# Patient Record
Sex: Female | Born: 1940 | Race: White | Hispanic: No | Marital: Married | Smoking: Never smoker
Health system: Southern US, Community
[De-identification: ages and names within clinical notes are randomized; demographics above are authoritative.]

## PROBLEM LIST (undated history)

## (undated) DIAGNOSIS — Z87442 Personal history of urinary calculi: Secondary | ICD-10-CM

## (undated) DIAGNOSIS — R809 Proteinuria, unspecified: Secondary | ICD-10-CM

## (undated) DIAGNOSIS — K2100 Gastro-esophageal reflux disease with esophagitis, without bleeding: Secondary | ICD-10-CM

## (undated) DIAGNOSIS — E785 Hyperlipidemia, unspecified: Secondary | ICD-10-CM

## (undated) DIAGNOSIS — J45909 Unspecified asthma, uncomplicated: Secondary | ICD-10-CM

## (undated) DIAGNOSIS — E119 Type 2 diabetes mellitus without complications: Secondary | ICD-10-CM

## (undated) DIAGNOSIS — G4733 Obstructive sleep apnea (adult) (pediatric): Secondary | ICD-10-CM

## (undated) DIAGNOSIS — I509 Heart failure, unspecified: Secondary | ICD-10-CM

## (undated) DIAGNOSIS — T7840XA Allergy, unspecified, initial encounter: Secondary | ICD-10-CM

## (undated) DIAGNOSIS — D649 Anemia, unspecified: Secondary | ICD-10-CM

## (undated) DIAGNOSIS — I10 Essential (primary) hypertension: Secondary | ICD-10-CM

## (undated) DIAGNOSIS — A6920 Lyme disease, unspecified: Secondary | ICD-10-CM

## (undated) DIAGNOSIS — L570 Actinic keratosis: Secondary | ICD-10-CM

## (undated) DIAGNOSIS — H25019 Cortical age-related cataract, unspecified eye: Secondary | ICD-10-CM

## (undated) DIAGNOSIS — K7581 Nonalcoholic steatohepatitis (NASH): Secondary | ICD-10-CM

## (undated) DIAGNOSIS — M810 Age-related osteoporosis without current pathological fracture: Secondary | ICD-10-CM

## (undated) DIAGNOSIS — J449 Chronic obstructive pulmonary disease, unspecified: Secondary | ICD-10-CM

## (undated) DIAGNOSIS — C801 Malignant (primary) neoplasm, unspecified: Secondary | ICD-10-CM

## (undated) DIAGNOSIS — R0902 Hypoxemia: Secondary | ICD-10-CM

## (undated) DIAGNOSIS — E039 Hypothyroidism, unspecified: Secondary | ICD-10-CM

## (undated) DIAGNOSIS — G4730 Sleep apnea, unspecified: Secondary | ICD-10-CM

## (undated) DIAGNOSIS — M1990 Unspecified osteoarthritis, unspecified site: Secondary | ICD-10-CM

## (undated) DIAGNOSIS — K649 Unspecified hemorrhoids: Secondary | ICD-10-CM

## (undated) DIAGNOSIS — K219 Gastro-esophageal reflux disease without esophagitis: Secondary | ICD-10-CM

## (undated) DIAGNOSIS — N289 Disorder of kidney and ureter, unspecified: Secondary | ICD-10-CM

## (undated) HISTORY — DX: Obstructive sleep apnea (adult) (pediatric): G47.33

## (undated) HISTORY — PX: PARTIAL HYSTERECTOMY: SHX80

## (undated) HISTORY — DX: Nonalcoholic steatohepatitis (NASH): K75.81

## (undated) HISTORY — DX: Hyperlipidemia, unspecified: E78.5

## (undated) HISTORY — DX: Lyme disease, unspecified: A69.20

## (undated) HISTORY — DX: Chronic obstructive pulmonary disease, unspecified: J44.9

## (undated) HISTORY — DX: Unspecified asthma, uncomplicated: J45.909

## (undated) HISTORY — DX: Proteinuria, unspecified: R80.9

## (undated) HISTORY — PX: HEMORRHOID SURGERY: SHX153

## (undated) HISTORY — DX: Sleep apnea, unspecified: G47.30

## (undated) HISTORY — DX: Essential (primary) hypertension: I10

## (undated) HISTORY — PX: ESOPHAGOGASTRODUODENOSCOPY: SHX1529

## (undated) HISTORY — DX: Type 2 diabetes mellitus without complications: E11.9

## (undated) HISTORY — PX: CHOLECYSTECTOMY: SHX55

## (undated) HISTORY — DX: Actinic keratosis: L57.0

## (undated) HISTORY — PX: EYE SURGERY: SHX253

## (undated) HISTORY — PX: TONSILLECTOMY: SUR1361

## (undated) HISTORY — DX: Disorder of kidney and ureter, unspecified: N28.9

## (undated) HISTORY — PX: ABDOMINAL HYSTERECTOMY: SHX81

## (undated) HISTORY — PX: COLONOSCOPY: SHX174

## (undated) HISTORY — DX: Hypoxemia: R09.02

## (undated) HISTORY — DX: Allergy, unspecified, initial encounter: T78.40XA

## (undated) HISTORY — PX: APPENDECTOMY: SHX54

## (undated) HISTORY — PX: CATARACT EXTRACTION: SUR2

---

## 1978-10-02 HISTORY — PX: CARDIAC CATHETERIZATION: SHX172

## 2004-11-08 ENCOUNTER — Ambulatory Visit: Payer: Self-pay | Admitting: Internal Medicine

## 2005-11-10 ENCOUNTER — Ambulatory Visit: Payer: Self-pay | Admitting: Internal Medicine

## 2006-01-08 ENCOUNTER — Ambulatory Visit: Payer: Self-pay | Admitting: Urology

## 2006-05-02 ENCOUNTER — Other Ambulatory Visit: Payer: Self-pay

## 2006-05-02 ENCOUNTER — Ambulatory Visit: Payer: Self-pay | Admitting: Urology

## 2006-05-24 ENCOUNTER — Ambulatory Visit: Payer: Self-pay | Admitting: Urology

## 2006-12-12 ENCOUNTER — Ambulatory Visit: Payer: Self-pay | Admitting: Internal Medicine

## 2007-04-10 ENCOUNTER — Ambulatory Visit: Payer: Self-pay | Admitting: Internal Medicine

## 2007-04-11 ENCOUNTER — Ambulatory Visit: Payer: Self-pay | Admitting: Internal Medicine

## 2007-04-12 ENCOUNTER — Ambulatory Visit: Payer: Self-pay | Admitting: Internal Medicine

## 2007-04-22 ENCOUNTER — Ambulatory Visit: Payer: Self-pay | Admitting: Urology

## 2007-04-26 ENCOUNTER — Emergency Department: Payer: Self-pay | Admitting: Emergency Medicine

## 2007-05-07 ENCOUNTER — Ambulatory Visit: Payer: Self-pay | Admitting: General Surgery

## 2007-05-07 ENCOUNTER — Other Ambulatory Visit: Payer: Self-pay

## 2007-05-13 ENCOUNTER — Ambulatory Visit: Payer: Self-pay | Admitting: General Surgery

## 2008-03-05 ENCOUNTER — Ambulatory Visit: Payer: Self-pay | Admitting: Internal Medicine

## 2008-06-24 ENCOUNTER — Ambulatory Visit: Payer: Self-pay | Admitting: Urology

## 2009-04-12 ENCOUNTER — Ambulatory Visit: Payer: Self-pay | Admitting: Internal Medicine

## 2009-06-28 ENCOUNTER — Ambulatory Visit: Payer: Self-pay | Admitting: Urology

## 2009-06-29 ENCOUNTER — Ambulatory Visit: Payer: Self-pay | Admitting: Urology

## 2010-04-14 ENCOUNTER — Ambulatory Visit: Payer: Self-pay | Admitting: Internal Medicine

## 2010-07-28 ENCOUNTER — Ambulatory Visit: Payer: Self-pay | Admitting: Urology

## 2010-08-11 ENCOUNTER — Ambulatory Visit: Payer: Self-pay | Admitting: Urology

## 2010-08-12 ENCOUNTER — Ambulatory Visit: Payer: Self-pay | Admitting: Urology

## 2010-12-01 ENCOUNTER — Ambulatory Visit: Payer: Self-pay | Admitting: Gastroenterology

## 2010-12-02 LAB — PATHOLOGY REPORT

## 2011-06-27 ENCOUNTER — Ambulatory Visit: Payer: Self-pay | Admitting: Internal Medicine

## 2011-07-24 ENCOUNTER — Emergency Department: Payer: Self-pay | Admitting: *Deleted

## 2011-08-10 ENCOUNTER — Ambulatory Visit: Payer: Self-pay | Admitting: Ophthalmology

## 2011-08-22 ENCOUNTER — Ambulatory Visit: Payer: Self-pay | Admitting: Ophthalmology

## 2011-09-13 ENCOUNTER — Ambulatory Visit: Payer: Self-pay | Admitting: Ophthalmology

## 2011-10-04 ENCOUNTER — Ambulatory Visit: Payer: Self-pay | Admitting: Ophthalmology

## 2012-06-20 ENCOUNTER — Emergency Department: Payer: Self-pay | Admitting: Emergency Medicine

## 2012-06-20 LAB — URINALYSIS, COMPLETE
Bilirubin,UR: NEGATIVE
Ketone: NEGATIVE
Leukocyte Esterase: NEGATIVE
Nitrite: NEGATIVE
Ph: 5 (ref 4.5–8.0)
Protein: >=500
RBC,UR: 3 /HPF (ref 0–5)

## 2012-06-20 LAB — COMPREHENSIVE METABOLIC PANEL
Albumin: 3.8 g/dL (ref 3.4–5.0)
Anion Gap: 10 (ref 7–16)
BUN: 13 mg/dL (ref 7–18)
Bilirubin,Total: 0.6 mg/dL (ref 0.2–1.0)
Chloride: 103 mmol/L (ref 98–107)
Co2: 29 mmol/L (ref 21–32)
Creatinine: 0.65 mg/dL (ref 0.60–1.30)
Glucose: 84 mg/dL (ref 65–99)
Potassium: 3.2 mmol/L — ABNORMAL LOW (ref 3.5–5.1)
SGPT (ALT): 44 U/L (ref 12–78)
Total Protein: 7.4 g/dL (ref 6.4–8.2)

## 2012-06-20 LAB — CBC
MCH: 28.8 pg (ref 26.0–34.0)
MCHC: 34.8 g/dL (ref 32.0–36.0)
Platelet: 174 10*3/uL (ref 150–440)
RBC: 5.27 10*6/uL — ABNORMAL HIGH (ref 3.80–5.20)
WBC: 7.8 10*3/uL (ref 3.6–11.0)

## 2012-06-20 LAB — CK TOTAL AND CKMB (NOT AT ARMC): CK-MB: 1.2 ng/mL (ref 0.5–3.6)

## 2012-06-20 LAB — TROPONIN I: Troponin-I: <0.02 ng/mL

## 2012-07-09 ENCOUNTER — Ambulatory Visit: Payer: Self-pay | Admitting: Internal Medicine

## 2012-08-13 ENCOUNTER — Ambulatory Visit: Payer: Self-pay | Admitting: Internal Medicine

## 2013-09-18 ENCOUNTER — Ambulatory Visit: Payer: Self-pay | Admitting: Internal Medicine
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
## 2014-05-17 DIAGNOSIS — G4733 Obstructive sleep apnea (adult) (pediatric): Secondary | ICD-10-CM | POA: Insufficient documentation

## 2014-05-17 DIAGNOSIS — Z9989 Dependence on other enabling machines and devices: Secondary | ICD-10-CM

## 2014-05-17 DIAGNOSIS — J45909 Unspecified asthma, uncomplicated: Secondary | ICD-10-CM | POA: Insufficient documentation

## 2014-05-17 DIAGNOSIS — K7581 Nonalcoholic steatohepatitis (NASH): Secondary | ICD-10-CM | POA: Insufficient documentation

## 2014-05-17 DIAGNOSIS — M810 Age-related osteoporosis without current pathological fracture: Secondary | ICD-10-CM | POA: Insufficient documentation

## 2014-07-16 DIAGNOSIS — D414 Neoplasm of uncertain behavior of bladder: Secondary | ICD-10-CM | POA: Insufficient documentation

## 2014-07-16 DIAGNOSIS — R310 Gross hematuria: Secondary | ICD-10-CM | POA: Insufficient documentation

## 2014-07-24 ENCOUNTER — Ambulatory Visit (INDEPENDENT_AMBULATORY_CARE_PROVIDER_SITE_OTHER): Payer: Medicare Other | Admitting: Cardiovascular Disease

## 2014-07-24 ENCOUNTER — Encounter: Payer: Self-pay | Admitting: Cardiovascular Disease

## 2014-07-24 ENCOUNTER — Telehealth: Payer: Self-pay | Admitting: *Deleted

## 2014-07-24 VITALS — BP 164/76 | HR 83 | Ht 62.0 in | Wt 193.5 lb

## 2014-07-24 DIAGNOSIS — I10 Essential (primary) hypertension: Secondary | ICD-10-CM | POA: Insufficient documentation

## 2014-07-24 DIAGNOSIS — R000 Tachycardia, unspecified: Secondary | ICD-10-CM

## 2014-07-24 DIAGNOSIS — R0600 Dyspnea, unspecified: Secondary | ICD-10-CM

## 2014-07-24 DIAGNOSIS — R002 Palpitations: Secondary | ICD-10-CM

## 2014-07-24 DIAGNOSIS — E785 Hyperlipidemia, unspecified: Secondary | ICD-10-CM | POA: Insufficient documentation

## 2014-07-24 DIAGNOSIS — R072 Precordial pain: Secondary | ICD-10-CM | POA: Insufficient documentation

## 2014-07-24 NOTE — Assessment & Plan Note (Signed)
Given significant dyspnea, I requested an echocardiogram to evaluate diastolic function and pulmonary pressure especially with her prolonged history of sleep apnea.

## 2014-07-24 NOTE — Progress Notes (Signed)
Primary care physician: Dr. Ouida Sills  HPI  This is a pleasant 73 year old female who is referred by Dr. Jacqlyn Larsen for evaluation of shortness of breath and chest pain. She has no previous cardiac history. She reports having cardiac catheterization in the 80s which showed no significant disease. She has chronic medical conditions that include type 2 diabetes, hyperlipidemia, hypertension, asthma, GERD, sleep apnea on CPAP and obesity. She reports prolonged symptoms of dyspnea with minimal activities associated with substernal chest tightness. This has been intermittent for a few years with some worsening recently. She always attributed these symptoms to asthma. She also reports intermittent episodes of palpitations mostly controlled with diltiazem. No dizziness, syncope or presyncope. No recent cardiac testing. She does have family history of coronary artery disease. She does not smoke or drink alcohol.  Allergies  Allergen Reactions  . Ace Inhibitors     Other reaction(s): Unknown  . Other     Other reaction(s): Other (See Comments) Eggs  . Prednisone     Other reaction(s): Other (See Comments)  . Risedronate     Other reaction(s): Other (See Comments)  . Sulfa Antibiotics     Other reaction(s): Other (See Comments)     No current outpatient prescriptions on file prior to visit.   No current facility-administered medications on file prior to visit.     Past Medical History  Diagnosis Date  . Hypertension   . Hyperlipidemia   . Diabetes mellitus without complication   . Asthma   . Albuminuria   . OSA (obstructive sleep apnea)   . Lyme disease   . Steatohepatitis      Past Surgical History  Procedure Laterality Date  . Cardiac catheterization  1980    [facility]  . Appendectomy    . Cataract extraction    . Tonsillectomy    . Cholecystectomy    . Partial hysterectomy       Family History  Problem Relation Age of Onset  . Heart attack Father      History   Social  History  . Marital Status: Married    Spouse Name: N/A    Number of Children: N/A  . Years of Education: N/A   Occupational History  . Not on file.   Social History Main Topics  . Smoking status: Never Smoker   . Smokeless tobacco: Not on file  . Alcohol Use: No  . Drug Use: No  . Sexual Activity: Not on file   Other Topics Concern  . Not on file   Social History Narrative  . No narrative on file     ROS A 10 point review of system was performed. It is negative other than that mentioned in the history of present illness.   PHYSICAL EXAM   BP 164/76  Pulse 83  Ht 5' 2"  (1.575 m)  Wt 193 lb 8 oz (87.771 kg)  BMI 35.38 kg/m2 Constitutional: She is oriented to person, place, and time. She appears well-developed and well-nourished. No distress.  HENT: No nasal discharge.  Head: Normocephalic and atraumatic.  Eyes: Pupils are equal and round. No discharge.  Neck: Normal range of motion. Neck supple. No JVD present. No thyromegaly present.  Cardiovascular: Normal rate, regular rhythm, normal heart sounds. Exam reveals no gallop and no friction rub.  One out of 6 systolic ejection murmur in the aortic area Pulmonary/Chest: Effort normal and breath sounds normal. No stridor. No respiratory distress. She has no wheezes. She has no rales. She exhibits no tenderness.  Abdominal: Soft. Bowel sounds are normal. She exhibits no distension. There is no tenderness. There is no rebound and no guarding.  Musculoskeletal: Normal range of motion. She exhibits no edema and no tenderness.  Neurological: She is alert and oriented to person, place, and time. Coordination normal.  Skin: Skin is warm and dry. No rash noted. She is not diaphoretic. No erythema. No pallor.  Psychiatric: She has a normal mood and affect. Her behavior is normal. Judgment and thought content normal.     PQA:ESLPN  Rhythm  WITHIN NORMAL LIMITS   ASSESSMENT AND PLAN

## 2014-07-24 NOTE — Telephone Encounter (Signed)
LVM to inform patient that her Myoview is scheduled for 07/29/14 at 10:30 Patient to arrive at 10:15

## 2014-07-24 NOTE — Patient Instructions (Addendum)
Your physician has requested that you have an echocardiogram. Echocardiography is a painless test that uses sound waves to create images of your heart. It provides your doctor with information about the size and shape of your heart and how well your heart's chambers and valves are working. This procedure takes approximately one hour. There are no restrictions for this procedure.  Your physician has requested that you have en exercise stress myoview. For further information please visit [email or web address]. Please follow instruction sheet, as given.  Follow up as needed.   [place]  Your caregiver has ordered a Stress Test with nuclear imaging. The purpose of this test is to evaluate the blood supply to your heart muscle. This procedure is referred to as a "Non-Invasive Stress Test." This is because other than having an IV started in your vein, nothing is inserted or "invades" your body. Cardiac stress tests are done to find areas of poor blood flow to the heart by determining the extent of coronary artery disease (CAD). Some patients exercise on a treadmill, which naturally increases the blood flow to your heart, while others who are  unable to walk on a treadmill due to physical limitations have a pharmacologic/chemical stress agent called Lexiscan . This medicine will mimic walking on a treadmill by temporarily increasing your coronary blood flow.   Please note: these test may take anywhere between 2-4 hours to complete  PLEASE REPORT TO [facility] AT THE FIRST DESK WILL DIRECT YOU WHERE TO GO  Date of Procedure:_____________________________________  Arrival Time for Procedure:______________________________  Instructions regarding medication:   __x__ : Hold diabetes medication morning of procedure  _x___:  Hold Diltiazem the morning of your procedure     PLEASE NOTIFY THE OFFICE AT LEAST 24 HOURS IN ADVANCE IF YOU ARE UNABLE TO KEEP YOUR APPOINTMENT.   [phone] AND  PLEASE NOTIFY NUCLEAR MEDICINE AT [facility] AT LEAST 24 HOURS IN ADVANCE IF YOU ARE UNABLE TO KEEP YOUR APPOINTMENT. [phone]  How to prepare for your Myoview test:  1. Do not eat or drink after midnight 2. No caffeine for 24 hours prior to test 3. No smoking 24 hours prior to test. 4. Your medication may be taken with water.  If your doctor stopped a medication because of this test, do not take that medication. 5. Ladies, please do not wear dresses.  Skirts or pants are appropriate. Please wear a short sleeve shirt. 6. No perfume, cologne or lotion. 7. Wear comfortable walking shoes. No heels!   Your next appointment will be scheduled in our new office located at :  [facility]  [address] [place]  [place], [place] [address]

## 2014-07-24 NOTE — Assessment & Plan Note (Signed)
Blood pressure is elevated. I will consider increasing the dose of diltiazem after cardiac workup.

## 2014-07-24 NOTE — Assessment & Plan Note (Signed)
Some of her symptoms are suggestive of class III angina although it is difficult to determine if this is due to asthma and deconditioning. Nonetheless, she has multiple risk factors for coronary artery disease including diabetes and thus I requested evaluation with a treadmill nuclear stress test.

## 2014-07-24 NOTE — Telephone Encounter (Signed)
Patient returned call  Informed her of date and time  Patient verbalized understanding

## 2014-07-24 NOTE — Assessment & Plan Note (Signed)
Continue treatment with a statin with a target LDL of less than 100.

## 2014-07-29 ENCOUNTER — Ambulatory Visit: Payer: Self-pay | Admitting: Cardiovascular Disease

## 2014-07-30 ENCOUNTER — Other Ambulatory Visit: Payer: Self-pay

## 2014-07-30 DIAGNOSIS — R0600 Dyspnea, unspecified: Secondary | ICD-10-CM

## 2014-07-30 DIAGNOSIS — R072 Precordial pain: Secondary | ICD-10-CM

## 2014-08-06 ENCOUNTER — Ambulatory Visit (INDEPENDENT_AMBULATORY_CARE_PROVIDER_SITE_OTHER): Payer: Medicare Other | Admitting: Cardiovascular Disease

## 2014-08-06 ENCOUNTER — Encounter: Payer: Self-pay | Admitting: Cardiovascular Disease

## 2014-08-06 VITALS — BP 163/77 | HR 76 | Ht 62.0 in | Wt 197.0 lb

## 2014-08-06 DIAGNOSIS — N184 Chronic kidney disease, stage 4 (severe): Secondary | ICD-10-CM | POA: Insufficient documentation

## 2014-08-06 DIAGNOSIS — R0789 Other chest pain: Secondary | ICD-10-CM

## 2014-08-06 DIAGNOSIS — E785 Hyperlipidemia, unspecified: Secondary | ICD-10-CM

## 2014-08-06 DIAGNOSIS — R072 Precordial pain: Secondary | ICD-10-CM

## 2014-08-06 DIAGNOSIS — N183 Chronic kidney disease, stage 3 unspecified: Secondary | ICD-10-CM | POA: Insufficient documentation

## 2014-08-06 DIAGNOSIS — I10 Essential (primary) hypertension: Secondary | ICD-10-CM

## 2014-08-06 DIAGNOSIS — N2889 Other specified disorders of kidney and ureter: Secondary | ICD-10-CM | POA: Insufficient documentation

## 2014-08-06 MED ORDER — DILTIAZEM HCL ER COATED BEADS 360 MG PO CP24: 360.0000 mg | ORAL_CAPSULE | Freq: Every day | ORAL | Status: DC

## 2014-08-06 NOTE — Patient Instructions (Addendum)
[facility] Cardiac Cath Instructions   You are scheduled for a Cardiac Cath on:_________________________  Please arrive at _______am on the day of your procedure  You will need to pre-register prior to the day of your procedure.  Enter through the [employer] at [facility].  Registration is the first desk on your right.  Please take the procedure order we have given you in order to be registered appropriately  Do not eat/drink anything after midnight  Someone will need to drive you home  It is recommended someone be with you for the first 24 hours after your procedure  Wear clothes that are easy to get on/off and wear slip on shoes if possible   Medications bring a current list of all medications with you   __x_ Do not take these medications before your procedure: Please hold your Metformin the day before, the day of and two days after. Take all of your remaining medications with enough water swallow safely.    Day of your procedure: Arrive at the [facility] Main entrance.  Free valet service is available.  After entering the [facility] please check-in at the registration desk (1st desk on your right) to receive your armband. After receiving your armband someone will escort you to the cardiac cath/special procedures waiting area.  The usual length of stay after your procedure is about 2 to 3 hours.  This can vary.  If you have any questions, please call our office at [phone], or you may call the cardiac cath lab at [facility] directly at [phone]  Your physician recommends that you return for lab work in:  Next week for pre cath labs  CBC, BMP, INR   Your physician has recommended you make the following change in your medication:  Increase Diltiazem to 360 mg once daily

## 2014-08-06 NOTE — Progress Notes (Signed)
Primary care physician: Dr. Ouida Sills Referring physician: Dr. Jacqlyn Larsen.  HPI  This is a pleasant 73 year old female who is here today for a follow-up visit regarding shortness of breath and chest pain. She has no previous cardiac history. She reports having cardiac catheterization in the 80s which showed no significant disease. She has chronic medical conditions that include type 2 diabetes, hyperlipidemia, hypertension, asthma, GERD, sleep apnea on CPAP and obesity. She Was seen recently for symptoms of dyspnea with minimal activities associated with substernal chest tightness. This has been intermittent for a few years with some worsening recently. She always attributed these symptoms to asthma. She also reports intermittent episodes of palpitations mostly controlled with diltiazem. No dizziness, syncope or presyncope. No recent cardiac testing. She underwent a nuclear stress test which showed moderate fixed inferior lateral defect with moderate reversibility and normal ejection fraction. 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 The patient continues to have the same symptoms.  Allergies  Allergen Reactions  . Ace Inhibitors     Other reaction(s): Unknown  . Other     Other reaction(s): Other (See Comments) Eggs  . Prednisone     Other reaction(s): Other (See Comments)  . Risedronate     Other reaction(s): Other (See Comments)  . Sulfa Antibiotics     Other reaction(s): Other (See Comments)     Current Outpatient Prescriptions on File Prior to Visit  Medication Sig Dispense Refill  . albuterol (PROVENTIL HFA;VENTOLIN HFA) 108 (90 BASE) MCG/ACT inhaler Inhale 1-2 puffs into the lungs every 4 (four) hours as needed for wheezing or shortness of breath.    . diltiazem (DILACOR XR) 240 MG 24 hr capsule Take 240 mg by mouth daily.    Marland Kitchen esomeprazole (NEXIUM) 20 MG capsule Take 20 mg by mouth daily at 12 noon.    . fluticasone (FLOVENT DISKUS) 50 MCG/BLIST diskus  inhaler Inhale 2 puffs into the lungs daily.    Marland Kitchen imipramine (TOFRANIL) 25 MG tablet Take 25 mg by mouth daily.    Marland Kitchen losartan-hydrochlorothiazide (HYZAAR) 100-25 MG per tablet Take 1 tablet by mouth daily.    . potassium chloride SA (K-DUR,KLOR-CON) 20 MEQ tablet Take 20 mEq by mouth 2 (two) times daily.    . rosuvastatin (CRESTOR) 40 MG tablet Take 40 mg by mouth daily.    . sertraline (ZOLOFT) 100 MG tablet Take 100 mg by mouth daily.    . zileuton (ZYFLO) 600 MG TABS tablet Take 600 mg by mouth daily.     . metFORMIN (GLUCOPHAGE) 500 MG tablet Take 500 mg by mouth daily with breakfast.     No current facility-administered medications on file prior to visit.     Past Medical History  Diagnosis Date  . Hypertension   . Hyperlipidemia   . Diabetes mellitus without complication   . Asthma   . Albuminuria   . OSA (obstructive sleep apnea)   . Lyme disease   . Steatohepatitis   . 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 Cardiac catheterization  1980    [facility]  . Appendectomy    . Cataract extraction    . Tonsillectomy    . Cholecystectomy    . Partial hysterectomy       Family History  Problem Relation Age of Onset  . Heart attack Father      History   Social History  . Marital Status: Married    Spouse Name:  N/A    Number of Children: N/A  . Years of Education: N/A   Occupational History  . Not on file.   Social History Main Topics  . Smoking status: Never Smoker   . Smokeless tobacco: Not on file  . Alcohol Use: No  . Drug Use: No  . Sexual Activity: Not on file   Other Topics Concern  . Not on file   Social History Narrative     ROS A 10 point review of system was performed. It is negative other than that mentioned in the history of present illness.   PHYSICAL EXAM   BP 163/77 mmHg  Pulse 76  Ht 5' 2"  (1.575 m)  Wt 197 lb (89.359 kg)  BMI 36.02 kg/m2 Constitutional: She is oriented to person, place, and  time. She appears well-developed and well-nourished. No distress.  HENT: No nasal discharge.  Head: Normocephalic and atraumatic.  Eyes: Pupils are equal and round. No discharge.  Neck: Normal range of motion. Neck supple. No JVD present. No thyromegaly present.  Cardiovascular: Normal rate, regular rhythm, normal heart sounds. Exam reveals no gallop and no friction rub.  One out of 6 systolic ejection murmur in the aortic area Pulmonary/Chest: Effort normal and breath sounds normal. No stridor. No respiratory distress. She has no wheezes. She has no rales. She exhibits no tenderness.  Abdominal: Soft. Bowel sounds are normal. She exhibits no distension. There is no tenderness. There is no rebound and no guarding.  Musculoskeletal: Normal range of motion. She exhibits no edema and no tenderness.  Neurological: She is alert and oriented to person, place, and time. Coordination normal.  Skin: Skin is warm and dry. No rash noted. She is not diaphoretic. No erythema. No pallor.  Psychiatric: She has a normal mood and affect. Her behavior is normal. Judgment and thought content normal.     ALP:[id number]  Rhythm  WITHIN NORMAL LIMITS   ASSESSMENT AND PLAN

## 2014-08-07 ENCOUNTER — Other Ambulatory Visit: Payer: Self-pay

## 2014-08-07 ENCOUNTER — Encounter: Payer: Self-pay | Admitting: Cardiovascular Disease

## 2014-08-07 ENCOUNTER — Other Ambulatory Visit (INDEPENDENT_AMBULATORY_CARE_PROVIDER_SITE_OTHER): Payer: Medicare Other

## 2014-08-07 ENCOUNTER — Telehealth: Payer: Self-pay | Admitting: *Deleted

## 2014-08-07 DIAGNOSIS — R0789 Other chest pain: Secondary | ICD-10-CM

## 2014-08-07 DIAGNOSIS — R0600 Dyspnea, unspecified: Secondary | ICD-10-CM

## 2014-08-07 DIAGNOSIS — R072 Precordial pain: Secondary | ICD-10-CM

## 2014-08-07 DIAGNOSIS — R0602 Shortness of breath: Secondary | ICD-10-CM

## 2014-08-07 NOTE — Assessment & Plan Note (Signed)
Blood pressure continues to be elevated. I increased the dose of diltiazem to 360 mg once daily.

## 2014-08-07 NOTE — Assessment & Plan Note (Signed)
Continue treatment with a statin with a target LDL of less than 100 or less than 70 if cardiac catheterization shows significant coronary artery disease.

## 2014-08-07 NOTE — Telephone Encounter (Signed)
Informed patient her cath is scheduled for 08/13/14 at 0930 Please arrive at 0830 Patient verbalized understanding

## 2014-08-07 NOTE — Assessment & Plan Note (Signed)
Some of her symptoms are suggestive of class III angina. She is already on a calcium channel blocker. Nuclear stress test was abnormal and moderate risk. Given her continued symptoms, I recommend proceeding with cardiac catheterization and possible coronary intervention. Risks, benefits and alternatives were discussed with the patient.

## 2014-08-11 ENCOUNTER — Other Ambulatory Visit (INDEPENDENT_AMBULATORY_CARE_PROVIDER_SITE_OTHER): Payer: Medicare Other

## 2014-08-11 DIAGNOSIS — R0789 Other chest pain: Secondary | ICD-10-CM

## 2014-08-12 ENCOUNTER — Telehealth: Payer: Self-pay | Admitting: *Deleted

## 2014-08-12 LAB — CBC WITH DIFFERENTIAL
BASOS ABS: 0.1 10*3/uL (ref 0.0–0.2)
Basos: 1 %
EOS: 6 %
Eosinophils Absolute: 0.4 10*3/uL (ref 0.0–0.4)
HCT: 42.6 % (ref 34.0–46.6)
Hemoglobin: 14.3 g/dL (ref 11.1–15.9)
IMMATURE GRANULOCYTES: 0 %
Immature Grans (Abs): 0 10*3/uL (ref 0.0–0.1)
LYMPHS ABS: 2.4 10*3/uL (ref 0.7–3.1)
LYMPHS: 31 %
MCH: 27.9 pg (ref 26.6–33.0)
MCHC: 33.6 g/dL (ref 31.5–35.7)
MCV: 83 fL (ref 79–97)
MONOCYTES: 6 %
Monocytes Absolute: 0.5 10*3/uL (ref 0.1–0.9)
Neutrophils Absolute: 4.3 10*3/uL (ref 1.4–7.0)
Neutrophils Relative %: 56 %
Platelets: 186 10*3/uL (ref 150–379)
RBC: 5.13 x10E6/uL (ref 3.77–5.28)
RDW: 13.7 % (ref 12.3–15.4)
WBC: 7.6 10*3/uL (ref 3.4–10.8)

## 2014-08-12 LAB — PROTIME-INR
INR: 1.1 (ref 0.8–1.2)
PROTHROMBIN TIME: 11 s (ref 9.1–12.0)

## 2014-08-12 LAB — BASIC METABOLIC PANEL
BUN/Creatinine Ratio: 20 (ref 11–26)
BUN: 16 mg/dL (ref 8–27)
CO2: 19 mmol/L (ref 18–29)
CREATININE: 0.8 mg/dL (ref 0.57–1.00)
Calcium: 9.7 mg/dL (ref 8.7–10.3)
Chloride: 99 mmol/L (ref 97–108)
GFR calc Af Amer: 85 mL/min/{1.73_m2} (ref >59)
GFR, EST NON AFRICAN AMERICAN: 73 mL/min/{1.73_m2} (ref >59)
GLUCOSE: 127 mg/dL — AB (ref 65–99)
Potassium: 5 mmol/L (ref 3.5–5.2)
SODIUM: 141 mmol/L (ref 134–144)

## 2014-08-12 NOTE — Telephone Encounter (Signed)
Faxed cath orders  Confirmed by sharon

## 2014-08-13 ENCOUNTER — Ambulatory Visit: Payer: Self-pay | Admitting: Cardiovascular Disease

## 2014-08-13 DIAGNOSIS — R079 Chest pain, unspecified: Secondary | ICD-10-CM

## 2014-08-13 HISTORY — PX: CARDIAC CATHETERIZATION: SHX172

## 2014-08-21 ENCOUNTER — Encounter: Payer: Self-pay | Admitting: Cardiovascular Disease

## 2014-09-02 ENCOUNTER — Encounter: Payer: Self-pay | Admitting: *Deleted

## 2014-09-04 ENCOUNTER — Encounter: Payer: Self-pay | Admitting: Cardiovascular Disease

## 2014-09-04 ENCOUNTER — Ambulatory Visit (INDEPENDENT_AMBULATORY_CARE_PROVIDER_SITE_OTHER): Payer: Medicare Other | Admitting: Cardiovascular Disease

## 2014-09-04 VITALS — BP 154/60 | HR 70

## 2014-09-04 DIAGNOSIS — I10 Essential (primary) hypertension: Secondary | ICD-10-CM

## 2014-09-04 DIAGNOSIS — R072 Precordial pain: Secondary | ICD-10-CM

## 2014-09-04 NOTE — Assessment & Plan Note (Signed)
BP improved after increasing Diltiazem.

## 2014-09-04 NOTE — Progress Notes (Signed)
Primary care physician: Dr. Ouida Sills Referring physician: Dr. Jacqlyn Larsen.  HPI  This is a pleasant 73 year old female who is here today for a follow-up visit regarding shortness of breath and chest pain. She has no previous cardiac history. She reports having cardiac catheterization in the 80s which showed no significant disease. She has chronic medical conditions that include type 2 diabetes, hyperlipidemia, hypertension, asthma, GERD, sleep apnea on CPAP and obesity. She Was seen recently for symptoms of dyspnea with minimal activities associated with substernal chest tightness. This has been intermittent for a few years with some worsening recently. She always attributed these symptoms to asthma. She also reports intermittent episodes of palpitations mostly controlled with diltiazem. No dizziness, syncope or presyncope. No recent cardiac testing. She underwent a nuclear stress test which showed moderate fixed inferior lateral defect with moderate reversibility and normal ejection fraction. 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 I proceeded with cardiac cath which showed minor irregularities with no obstructive disease and normal LVEDP.   Allergies  Allergen Reactions  . Ace Inhibitors     Other reaction(s): Unknown  . Other     Other reaction(s): Other (See Comments) Eggs  . Prednisone     Other reaction(s): Other (See Comments)  . Risedronate     Other reaction(s): Other (See Comments)  . Sulfa Antibiotics     Other reaction(s): Other (See Comments)     Current Outpatient Prescriptions on File Prior to Visit  Medication Sig Dispense Refill  . albuterol (PROVENTIL HFA;VENTOLIN HFA) 108 (90 BASE) MCG/ACT inhaler Inhale 1-2 puffs into the lungs every 4 (four) hours as needed for wheezing or shortness of breath.    . diltiazem (CARDIZEM CD) 360 MG 24 hr capsule Take 1 capsule (360 mg total) by mouth daily. 30 capsule 6  . esomeprazole (NEXIUM) 20 MG capsule  Take 20 mg by mouth daily at 12 noon.    . fluticasone (FLOVENT DISKUS) 50 MCG/BLIST diskus inhaler Inhale 2 puffs into the lungs daily.    Marland Kitchen imipramine (TOFRANIL) 25 MG tablet Take 25 mg by mouth daily.    Marland Kitchen losartan-hydrochlorothiazide (HYZAAR) 100-25 MG per tablet Take 1 tablet by mouth daily.    . metFORMIN (GLUCOPHAGE) 500 MG tablet Take 500 mg by mouth daily with breakfast.    . potassium chloride SA (K-DUR,KLOR-CON) 20 MEQ tablet Take 20 mEq by mouth 2 (two) times daily.    . rosuvastatin (CRESTOR) 40 MG tablet Take 40 mg by mouth daily.    . sertraline (ZOLOFT) 100 MG tablet Take 100 mg by mouth daily.    . zileuton (ZYFLO) 600 MG TABS tablet Take 600 mg by mouth daily.      No current facility-administered medications on file prior to visit.     Past Medical History  Diagnosis Date  . Hypertension   . Hyperlipidemia   . Diabetes mellitus without complication   . Asthma   . Albuminuria   . OSA (obstructive sleep apnea)   . Lyme disease   . Steatohepatitis   . 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 Appendectomy    . Cataract extraction    . Tonsillectomy    . Cholecystectomy    . Partial hysterectomy    . Cardiac catheterization  1980    [facility]  . Cardiac catheterization  08/13/2014    [facility]. no significant CAD, normal LVEDP.      Family History  Problem  Relation Age of Onset  . Heart attack Father      History   Social History  . Marital Status: Married    Spouse Name: N/A    Number of Children: N/A  . Years of Education: N/A   Occupational History  . Not on file.   Social History Main Topics  . Smoking status: Never Smoker   . Smokeless tobacco: Not on file  . Alcohol Use: No  . Drug Use: No  . Sexual Activity: Not on file   Other Topics Concern  . Not on file   Social History Narrative     ROS A 10 point review of system was performed. It is negative other than that mentioned in the history of  present illness.   PHYSICAL EXAM   BP 154/60 mmHg  Pulse 70 Constitutional: She is oriented to person, place, and time. She appears well-developed and well-nourished. No distress.  HENT: No nasal discharge.  Head: Normocephalic and atraumatic.  Eyes: Pupils are equal and round. No discharge.  Neck: Normal range of motion. Neck supple. No JVD present. No thyromegaly present.  Cardiovascular: Normal rate, regular rhythm, normal heart sounds. Exam reveals no gallop and no friction rub.  One out of 6 systolic ejection murmur in the aortic area Pulmonary/Chest: Effort normal and breath sounds normal. No stridor. No respiratory distress. She has no wheezes. She has no rales. She exhibits no tenderness.  Abdominal: Soft. Bowel sounds are normal. She exhibits no distension. There is no tenderness. There is no rebound and no guarding.  Musculoskeletal: Normal range of motion. She exhibits no edema and no tenderness.  Neurological: She is alert and oriented to person, place, and time. Coordination normal.  Skin: Skin is warm and dry. No rash noted. She is not diaphoretic. No erythema. No pallor.  Psychiatric: She has a normal mood and affect. Her behavior is normal. Judgment and thought content normal.  Right radial pulse is normal with no hematoma.   NGI:[id number]  Rhythm  WITHIN NORMAL LIMITS   ASSESSMENT AND PLAN

## 2014-09-04 NOTE — Assessment & Plan Note (Signed)
Non cardiac. Cardiac cath showed no significant CAD with normal LVEDP. Exertional dyspnea is likely multifactorial due to lung disease and physical deconditioning.  I advised her to follow up with PCP.

## 2014-09-04 NOTE — Patient Instructions (Signed)
Follow up as needed

## 2014-09-17 ENCOUNTER — Encounter: Payer: Self-pay | Admitting: Cardiovascular Disease

## 2014-09-21 ENCOUNTER — Ambulatory Visit: Payer: Self-pay | Admitting: Internal Medicine

## 2015-01-24 NOTE — Op Note (Signed)
PATIENT NAME:  [name], [name] MR#:  [id number] DATE OF BIRTH:  [birth year]  DATE OF PROCEDURE:  10/04/2011  PREOPERATIVE DIAGNOSIS: Visually significant cataract of the right eye.   POSTOPERATIVE DIAGNOSIS: Visually significant cataract of the right eye.   OPERATIVE PROCEDURE: Cataract extraction by phacoemulsification with implant of intraocular lens to right eye.   SURGEON: Birder Robson, MD.   ANESTHESIA:  1. Managed anesthesia care.  2. Topical tetracaine drops followed by 2% Xylocaine jelly applied in the preoperative holding area.   COMPLICATIONS: None.   TECHNIQUE:  Stop-and-chop    DESCRIPTION OF PROCEDURE: The patient was examined and consented in the preoperative holding area where the aforementioned topical anesthesia was applied to the right eye and then brought back to the Operating Room where the right eye was prepped and draped in the usual sterile ophthalmic fashion and a lid speculum was placed. A paracentesis was created with the side port blade and the anterior chamber was filled with viscoelastic. A near clear corneal incision was performed with the steel keratome. A continuous curvilinear capsulorrhexis was performed with a cystotome followed by the capsulorrhexis forceps. Hydrodissection and hydrodelineation were carried out with BSS on a blunt cannula. The lens was removed in a stop-and-chop technique and the remaining cortical material was removed with the irrigation-aspiration handpiece. The capsular bag was inflated with viscoelastic and the Technus ZCB00 23.0-diopter lens, serial number [id number] was placed in the capsular bag without complication. The remaining viscoelastic was removed from the eye with the irrigation-aspiration handpiece. The wounds were hydrated. The anterior chamber was flushed with Miostat and the eye was inflated to physiologic pressure. The wounds were found to be water tight. The eye was dressed with Vigamox and Omnipred. The patient was given  protective glasses to wear throughout the day and a shield with which to sleep tonight. The patient was also given drops with which to begin a drop regimen today and will follow-up with me in one day.   ____________________________ Livingston Diones. Sister Carbone, MD wlp:drc D: 10/04/2011 17:15:42 ET T: 10/04/2011 18:06:28 ET JOB#: [id number]  cc: Devontre Siedschlag L. Lani Havlik, MD, <Dictator> Livingston Diones Dessa Ledee MD ELECTRONICALLY SIGNED 10/10/2011 13:53

## 2015-06-28 ENCOUNTER — Other Ambulatory Visit: Payer: Self-pay | Admitting: Cardiovascular Disease

## 2015-07-14 DIAGNOSIS — N3941 Urge incontinence: Secondary | ICD-10-CM | POA: Insufficient documentation

## 2015-09-21 ENCOUNTER — Encounter: Payer: Self-pay | Admitting: Emergency Medicine

## 2015-09-21 ENCOUNTER — Emergency Department: Payer: Medicare Other

## 2015-09-21 ENCOUNTER — Emergency Department
Admission: EM | Admit: 2015-09-21 | Discharge: 2015-09-21 | Disposition: A | Discharge status: Home / Self Care | Payer: Medicare Other | Attending: Emergency Medicine | Admitting: Emergency Medicine

## 2015-09-21 DIAGNOSIS — E119 Type 2 diabetes mellitus without complications: Secondary | ICD-10-CM | POA: Insufficient documentation

## 2015-09-21 DIAGNOSIS — Y9389 Activity, other specified: Secondary | ICD-10-CM | POA: Diagnosis not present

## 2015-09-21 DIAGNOSIS — Z79899 Other long term (current) drug therapy: Secondary | ICD-10-CM | POA: Diagnosis not present

## 2015-09-21 DIAGNOSIS — S0181XA Laceration without foreign body of other part of head, initial encounter: Secondary | ICD-10-CM

## 2015-09-21 DIAGNOSIS — Y998 Other external cause status: Secondary | ICD-10-CM | POA: Diagnosis not present

## 2015-09-21 DIAGNOSIS — W01198A Fall on same level from slipping, tripping and stumbling with subsequent striking against other object, initial encounter: Secondary | ICD-10-CM | POA: Diagnosis not present

## 2015-09-21 DIAGNOSIS — I10 Essential (primary) hypertension: Secondary | ICD-10-CM | POA: Diagnosis not present

## 2015-09-21 DIAGNOSIS — S80211A Abrasion, right knee, initial encounter: Secondary | ICD-10-CM | POA: Diagnosis not present

## 2015-09-21 DIAGNOSIS — Z23 Encounter for immunization: Secondary | ICD-10-CM | POA: Diagnosis not present

## 2015-09-21 DIAGNOSIS — Z7951 Long term (current) use of inhaled steroids: Secondary | ICD-10-CM | POA: Diagnosis not present

## 2015-09-21 DIAGNOSIS — S01111A Laceration without foreign body of right eyelid and periocular area, initial encounter: Secondary | ICD-10-CM | POA: Diagnosis not present

## 2015-09-21 DIAGNOSIS — S50811A Abrasion of right forearm, initial encounter: Secondary | ICD-10-CM | POA: Insufficient documentation

## 2015-09-21 DIAGNOSIS — Y92009 Unspecified place in unspecified non-institutional (private) residence as the place of occurrence of the external cause: Secondary | ICD-10-CM | POA: Diagnosis not present

## 2015-09-21 DIAGNOSIS — Z7984 Long term (current) use of oral hypoglycemic drugs: Secondary | ICD-10-CM | POA: Diagnosis not present

## 2015-09-21 DIAGNOSIS — S0083XA Contusion of other part of head, initial encounter: Secondary | ICD-10-CM

## 2015-09-21 DIAGNOSIS — S0990XA Unspecified injury of head, initial encounter: Secondary | ICD-10-CM | POA: Diagnosis present

## 2015-09-21 MED ORDER — TETANUS-DIPHTH-ACELL PERTUSSIS 5-2.5-18.5 LF-MCG/0.5 IM SUSP
0.5000 mL | Freq: Once | INTRAMUSCULAR | Status: AC
  Administered 2015-09-21: 0.5 mL via INTRAMUSCULAR
  Filled 2015-09-21: qty 0.5

## 2015-09-21 MED ORDER — LIDOCAINE-EPINEPHRINE (PF) 1 %-1:200000 IJ SOLN
INTRAMUSCULAR | Status: AC
  Administered 2015-09-21: 13:00:00
  Filled 2015-09-21: qty 30

## 2015-09-21 NOTE — ED Notes (Signed)
Pt reports falling in kitchen this morning after getting foot twisted in rug.  Pt reports hitting her head on the handle of a cabinet.  Pt with gash/laceration on Right eyebrow bone and small abrasion on R upper forehead.  Pt denies losing consciousness.

## 2015-09-21 NOTE — Discharge Instructions (Signed)
You were evaluated after a fall, and your exam and evaluation are reassuring for no serious injury. Laceration was closed with stitches. The should come out in 5-7 days. Call your primary care physician's office to make an appointment.  Return to emergency department for any worsening condition including worsening pain, numbness, weakness, confusion or altered mental status, or any signs of infection from the laceration including redness, new swelling, or any drainage.   Facial or Scalp Contusion  A facial or scalp contusion is a deep bruise on the face or head. Contusions happen when an injury causes bleeding under the skin. Signs of bruising include pain, puffiness (swelling), and discolored skin. The contusion may turn blue, purple, or yellow. HOME CARE  Only take medicines as told by your doctor.  Put ice on the injured area.  Put ice in a plastic bag.  Place a towel between your skin and the bag.  Leave the ice on for 20 minutes, 2-3 times a day. GET HELP IF:  You have bite problems.  You have pain when chewing.  You are worried about your face not healing normally. GET HELP RIGHT AWAY IF:   You have severe pain or a headache and medicine does not help.  You are very tired or confused, or your personality changes.  You throw up (vomit).  You have a nosebleed that will not stop.  You see two of everything (double vision) or have blurry vision.  You have fluid coming from your nose or ear.  You have problems walking or using your arms or legs. MAKE SURE YOU:   Understand these instructions.  Will watch your condition.  Will get help right away if you are not doing well or get worse.   This information is not intended to replace advice given to you by your health care provider. Make sure you discuss any questions you have with your health care provider.   Document Released: 09/07/2011 Document Revised: 10/09/2014 Document Reviewed: 05/01/2013 Elsevier Interactive  Patient Education 2016 [employer]  Facial Laceration A facial laceration is a cut on the face. These injuries can be painful and cause bleeding. Some cuts may need to be closed with stitches (sutures), skin adhesive strips, or wound glue. Cuts usually heal quickly but can leave a scar. It can take 1-2 years for the scar to go away completely. HOME CARE   Only take medicines as told by your doctor.  Follow your doctor's instructions for wound care. For Stitches:  Keep the cut clean and dry.  If you have a bandage (dressing), change it at least once a day. Change the bandage if it gets wet or dirty, or as told by your doctor.  Wash the cut with soap and water 2 times a day. Rinse the cut with water. Pat it dry with a clean towel.  Put a thin layer of medicated cream on the cut as told by your doctor.  You may shower after the first 24 hours. Do not soak the cut in water until the stitches are removed.  Have your stitches removed as told by your doctor.  Do not wear any makeup until a few days after your stitches are removed. For Skin Adhesive Strips:  Keep the cut clean and dry.  Do not get the strips wet. You may take a bath, but be careful to keep the cut dry.  If the cut gets wet, pat it dry with a clean towel.  The strips will fall off on  their own. Do not remove the strips that are still stuck to the cut. For Wound Glue:  You may shower or take baths. Do not soak or scrub the cut. Do not swim. Avoid heavy sweating until the glue falls off on its own. After a shower or bath, pat the cut dry with a clean towel.  Do not put medicine or makeup on your cut until the glue falls off.  If you have a bandage, do not put tape over the glue.  Avoid lots of sunlight or tanning lamps until the glue falls off.  The glue will fall off on its own in 5-10 days. Do not pick at the glue. After Healing:  Put sunscreen on the cut for the first year to reduce your scar. GET HELP  IF:  You have a fever. GET HELP RIGHT AWAY IF:   Your cut area gets red, painful, or puffy (swollen).  You see a yellowish-white fluid (pus) coming from the cut.   This information is not intended to replace advice given to you by your health care provider. Make sure you discuss any questions you have with your health care provider.   Document Released: 03/06/2008 Document Revised: 10/09/2014 Document Reviewed: 05/01/2013 Elsevier Interactive Patient Education [employer].

## 2015-09-21 NOTE — ED Provider Notes (Signed)
[facility] Emergency Department Provider Note   ____________________________________________  Time seen: 10 AM I have reviewed the triage vital signs and the triage nursing note.  HISTORY  Chief Complaint Head Injury   Historian Patient  HPI [name] is a 74 y.o. female is here for evaluation after head injury. This happened this morning. She states she caught her toe on a rug and hit her right forehead into a door handle. She also has a minor abrasion on her right forearm and right knee. She has been able to walk. No neck pain. No weakness or numbness. No loss of consciousness. States she needs a tetanus booster. No exacerbating factors. Symptoms are mild to moderate. She does have a laceration to her right eyebrow    Past Medical History  Diagnosis Date  . Hypertension   . Hyperlipidemia   . Diabetes mellitus without complication ([place])   . Asthma   . Albuminuria   . OSA (obstructive sleep apnea)   . Lyme disease   . Steatohepatitis   . No kidney function     Patient Active Problem List   Diagnosis Date Noted  . Precordial pain 07/24/2014  . Dyspnea 07/24/2014  . Palpitations 07/24/2014  . Essential hypertension 07/24/2014  . Hyperlipidemia 07/24/2014    Past Surgical History  Procedure Laterality Date  . Appendectomy    . Cataract extraction    . Tonsillectomy    . Cholecystectomy    . Partial hysterectomy    . Cardiac catheterization  1980    [facility]  . Cardiac catheterization  08/13/2014    [facility]. no significant CAD, normal LVEDP.     Current Outpatient Rx  Name  Route  Sig  Dispense  Refill  . albuterol (PROVENTIL HFA;VENTOLIN HFA) 108 (90 BASE) MCG/ACT inhaler   Inhalation   Inhale 1-2 puffs into the lungs every 4 (four) hours as needed for wheezing or shortness of breath.         . diltiazem (TIAZAC) 360 MG 24 hr capsule      TAKE 1 CAPSULE EVERY DAY   30 capsule   3     FOR NEXT FILLTHANK YOU   . esomeprazole  (NEXIUM) 20 MG capsule   Oral   Take 20 mg by mouth daily at 12 noon.         . fluticasone (FLOVENT DISKUS) 50 MCG/BLIST diskus inhaler   Inhalation   Inhale 2 puffs into the lungs daily.         Marland Kitchen imipramine (TOFRANIL) 25 MG tablet   Oral   Take 25 mg by mouth daily.         Marland Kitchen losartan-hydrochlorothiazide (HYZAAR) 100-25 MG per tablet   Oral   Take 1 tablet by mouth daily.         . metFORMIN (GLUCOPHAGE) 500 MG tablet   Oral   Take 500 mg by mouth daily with breakfast.         . potassium chloride SA (K-DUR,KLOR-CON) 20 MEQ tablet   Oral   Take 20 mEq by mouth 2 (two) times daily.         . rosuvastatin (CRESTOR) 40 MG tablet   Oral   Take 40 mg by mouth daily.         . sertraline (ZOLOFT) 100 MG tablet   Oral   Take 100 mg by mouth daily.         . zileuton (ZYFLO) 600 MG TABS tablet  Oral   Take 600 mg by mouth daily.            Allergies Ace inhibitors; Other; Prednisone; Risedronate; and Sulfa antibiotics  Family History  Problem Relation Age of Onset  . Heart attack Father     Social History Social History  Substance Use Topics  . Smoking status: Never Smoker   . Smokeless tobacco: None  . Alcohol Use: No    Review of Systems  Constitutional: Negative for fever. Eyes: Negative for visual changes. ENT: Negative for sore throat. Cardiovascular: Negative for chest pain. Respiratory: Negative for shortness of breath. Gastrointestinal: Negative for abdominal pain, vomiting and diarrhea. Genitourinary: Negative for dysuria. Musculoskeletal: Negative for back pain. Skin: Negative for rash. Neurological: Negative for headache. 10 point Review of Systems otherwise negative ____________________________________________   PHYSICAL EXAM:  VITAL SIGNS: ED Triage Vitals  Enc Vitals Group     BP 09/21/15 0931 147/53 mmHg     Pulse Rate 09/21/15 0931 75     Resp 09/21/15 0931 20     Temp 09/21/15 0931 98.1 F (36.7 C)     Temp  Source 09/21/15 0931 Oral     SpO2 09/21/15 0931 95 %     Weight 09/21/15 0931 197 lb (89.359 kg)     Height --      Head Cir --      Peak Flow --      Pain Score 09/21/15 0932 3     Pain Loc --      Pain Edu? --      Excl. in [place]? --      Constitutional: Alert and oriented. Well appearing and in no distress. Eyes: Conjunctivae are normal. PERRL. Normal extraocular movements. ENT   Head: Hematoma/ecchymosis right forehead. 3 cm laceration curvilinear right eyebrow deep.   Nose: No congestion/rhinnorhea.   Mouth/Throat: Mucous membranes are moist.   Neck: No stridor. No posterior midline tenderness to palpation or range of motion. Cardiovascular/Chest: Normal rate, regular rhythm.  No murmurs, rubs, or gallops. Respiratory: Normal respiratory effort without tachypnea nor retractions. Breath sounds are clear and equal bilaterally. No wheezes/rales/rhonchi. Gastrointestinal: Soft. No distention, no guarding, no rebound. Nontender   Genitourinary/rectal:Deferred Musculoskeletal: Nontender with normal range of motion in all extremities. No joint effusions.  No lower extremity tenderness.  No edema. Small abrasion right volar forearm, small abrasion right knee. Neurologic:  Normal speech and language. No gross or focal neurologic deficits are appreciated. Skin:  Skin is warm, dry and intact. No rash noted. Psychiatric: Mood and affect are normal. Speech and behavior are normal. Patient exhibits appropriate insight and judgment.  ____________________________________________   EKG I, Lisa Roca, MD, the attending physician have personally viewed and interpreted all ECGs.  None ____________________________________________  LABS (pertinent positives/negatives)  None  ____________________________________________  RADIOLOGY All Xrays were viewed by me. Imaging interpreted by Radiologist.  CT head noncontrast:  IMPRESSION: Laceration and large hematoma above the  right eye without underlying fracture or acute intracranial abnormality.   __________________________________________  PROCEDURES  Procedure(s) performed: LACERATION REPAIR Performed by: Lisa Roca Authorized by: Lisa Roca Consent: Verbal consent obtained. Risks and benefits: risks, benefits and alternatives were discussed Consent given by: patient Patient identity confirmed: provided demographic data Prepped and Draped in normal sterile fashion Wound explored  Laceration Location: right forehead  Laceration Length: 3cm  No Foreign Bodies seen or palpated  Anesthesia: local infiltration  Local anesthetic: lidocaine 1% with epinephrine  Anesthetic total: 1.5 ml  Irrigation method: syringe  Amount of cleaning: standard  Skin closure: 6-0 Prolene   Number of sutures: 3   Technique: Interrupted   Patient tolerance: Patient tolerated the procedure well with no immediate complications.   Critical Care performed: None  ____________________________________________   ED COURSE / ASSESSMENT AND PLAN  CONSULTATIONS: None  Pertinent labs & imaging results that were available during my care of the patient were reviewed by me and considered in my medical decision making (see chart for details).  No suspected neck injury. No suspected bony injury of the extremities. She does have minor contusion/abrasion to the right forearm and right knee.   CT head negative for intracranial injury or skull fracture.  Laceration was repaired.   Patient / Family / Caregiver informed of clinical course, medical decision-making process, and agree with plan.   I discussed return precautions, follow-up instructions, and discharged instructions with patient and/or family.  ___________________________________________   FINAL CLINICAL IMPRESSION(S) / ED DIAGNOSES   Final diagnoses:  Facial laceration, initial encounter  Facial contusion, initial encounter  Head injury,  initial encounter  Abrasion of right forearm, initial encounter  Abrasion of right knee, initial encounter       Lisa Roca, MD 09/21/15 1242

## 2015-09-21 NOTE — ED Notes (Signed)
Pt to ed with c/o headache after falling this am.  Pt states she got her feet caught in a rug at home and fell into a cabinet.  Pt with small laceration to right eye brow.  Bleeding controlled at this time.  Pt alert and oriented and denies loss of consciousness.  Pt also denies dizziness or vision changes.

## 2015-09-30 ENCOUNTER — Ambulatory Visit
Admission: RE | Admit: 2015-09-30 | Discharge: 2015-09-30 | Disposition: A | Discharge status: Home / Self Care | Payer: Medicare Other | Source: Ambulatory Visit | Attending: Physician Assistant | Admitting: Physician Assistant

## 2015-09-30 ENCOUNTER — Other Ambulatory Visit: Payer: Self-pay | Admitting: Physician Assistant

## 2015-09-30 DIAGNOSIS — S0990XD Unspecified injury of head, subsequent encounter: Secondary | ICD-10-CM | POA: Diagnosis not present

## 2015-09-30 DIAGNOSIS — J3489 Other specified disorders of nose and nasal sinuses: Secondary | ICD-10-CM | POA: Insufficient documentation

## 2015-09-30 DIAGNOSIS — W19XXXA Unspecified fall, initial encounter: Secondary | ICD-10-CM | POA: Diagnosis not present

## 2015-11-25 ENCOUNTER — Other Ambulatory Visit: Payer: Self-pay | Admitting: Cardiovascular Disease

## 2015-11-29 ENCOUNTER — Other Ambulatory Visit: Payer: Self-pay | Admitting: Cardiovascular Disease

## 2016-11-01 DIAGNOSIS — R339 Retention of urine, unspecified: Secondary | ICD-10-CM | POA: Insufficient documentation

## 2016-11-24 ENCOUNTER — Ambulatory Visit
Admission: RE | Admit: 2016-11-24 | Discharge: 2016-11-24 | Disposition: A | Discharge status: Home / Self Care | Payer: Medicare Other | Source: Ambulatory Visit | Attending: Physician Assistant | Admitting: Physician Assistant

## 2016-11-24 ENCOUNTER — Other Ambulatory Visit: Payer: Self-pay | Admitting: Physician Assistant

## 2016-11-24 DIAGNOSIS — M7989 Other specified soft tissue disorders: Secondary | ICD-10-CM | POA: Diagnosis not present

## 2016-12-06 ENCOUNTER — Encounter: Payer: Self-pay | Admitting: *Deleted

## 2016-12-07 ENCOUNTER — Ambulatory Visit
Admission: RE | Admit: 2016-12-07 | Discharge: 2016-12-07 | Disposition: A | Discharge status: Home / Self Care | Payer: Medicare Other | Source: Ambulatory Visit | Attending: Gastroenterology | Admitting: Gastroenterology

## 2016-12-07 ENCOUNTER — Ambulatory Visit: Payer: Medicare Other | Admitting: Anesthesiology

## 2016-12-07 ENCOUNTER — Encounter: Payer: Self-pay | Admitting: *Deleted

## 2016-12-07 ENCOUNTER — Encounter
Admission: RE | Disposition: A | Discharge status: Home / Self Care | Payer: Self-pay | Source: Ambulatory Visit | Attending: Gastroenterology

## 2016-12-07 DIAGNOSIS — Z79899 Other long term (current) drug therapy: Secondary | ICD-10-CM | POA: Diagnosis not present

## 2016-12-07 DIAGNOSIS — E119 Type 2 diabetes mellitus without complications: Secondary | ICD-10-CM | POA: Insufficient documentation

## 2016-12-07 DIAGNOSIS — M810 Age-related osteoporosis without current pathological fracture: Secondary | ICD-10-CM | POA: Diagnosis not present

## 2016-12-07 DIAGNOSIS — E785 Hyperlipidemia, unspecified: Secondary | ICD-10-CM | POA: Insufficient documentation

## 2016-12-07 DIAGNOSIS — K317 Polyp of stomach and duodenum: Secondary | ICD-10-CM | POA: Insufficient documentation

## 2016-12-07 DIAGNOSIS — J45909 Unspecified asthma, uncomplicated: Secondary | ICD-10-CM | POA: Diagnosis not present

## 2016-12-07 DIAGNOSIS — K219 Gastro-esophageal reflux disease without esophagitis: Secondary | ICD-10-CM | POA: Diagnosis present

## 2016-12-07 DIAGNOSIS — I10 Essential (primary) hypertension: Secondary | ICD-10-CM | POA: Diagnosis not present

## 2016-12-07 DIAGNOSIS — Z7984 Long term (current) use of oral hypoglycemic drugs: Secondary | ICD-10-CM | POA: Diagnosis not present

## 2016-12-07 DIAGNOSIS — Z800 Family history of malignant neoplasm of digestive organs: Secondary | ICD-10-CM | POA: Insufficient documentation

## 2016-12-07 DIAGNOSIS — K449 Diaphragmatic hernia without obstruction or gangrene: Secondary | ICD-10-CM | POA: Diagnosis not present

## 2016-12-07 DIAGNOSIS — G4733 Obstructive sleep apnea (adult) (pediatric): Secondary | ICD-10-CM | POA: Insufficient documentation

## 2016-12-07 DIAGNOSIS — Z1211 Encounter for screening for malignant neoplasm of colon: Secondary | ICD-10-CM | POA: Insufficient documentation

## 2016-12-07 DIAGNOSIS — K210 Gastro-esophageal reflux disease with esophagitis: Secondary | ICD-10-CM | POA: Diagnosis not present

## 2016-12-07 DIAGNOSIS — K2970 Gastritis, unspecified, without bleeding: Secondary | ICD-10-CM | POA: Diagnosis not present

## 2016-12-07 DIAGNOSIS — G4730 Sleep apnea, unspecified: Secondary | ICD-10-CM | POA: Insufficient documentation

## 2016-12-07 HISTORY — DX: Unspecified osteoarthritis, unspecified site: M19.90

## 2016-12-07 HISTORY — DX: Unspecified hemorrhoids: K64.9

## 2016-12-07 HISTORY — DX: Cortical age-related cataract, unspecified eye: H25.019

## 2016-12-07 HISTORY — PX: ESOPHAGOGASTRODUODENOSCOPY (EGD) WITH PROPOFOL: SHX5813

## 2016-12-07 HISTORY — DX: Age-related osteoporosis without current pathological fracture: M81.0

## 2016-12-07 HISTORY — PX: COLONOSCOPY WITH PROPOFOL: SHX5780

## 2016-12-07 LAB — GLUCOSE, CAPILLARY: GLUCOSE-CAPILLARY: 140 mg/dL — AB (ref 65–99)

## 2016-12-07 SURGERY — COLONOSCOPY WITH PROPOFOL
Anesthesia: General

## 2016-12-07 MED ORDER — PROPOFOL 10 MG/ML IV BOLUS
INTRAVENOUS | Status: DC | PRN
  Administered 2016-12-07: 50 mg via INTRAVENOUS
  Administered 2016-12-07: 90 mg via INTRAVENOUS
  Administered 2016-12-07: 40 mg via INTRAVENOUS

## 2016-12-07 MED ORDER — SODIUM CHLORIDE 0.9 % IV SOLN
INTRAVENOUS | Status: DC
  Administered 2016-12-07: 07:00:00 via INTRAVENOUS

## 2016-12-07 MED ORDER — PROPOFOL 10 MG/ML IV BOLUS
INTRAVENOUS | Status: AC
  Filled 2016-12-07: qty 20

## 2016-12-07 MED ORDER — SODIUM CHLORIDE 0.9 % IV SOLN
INTRAVENOUS | Status: DC
  Administered 2016-12-07: 08:00:00 via INTRAVENOUS

## 2016-12-07 MED ORDER — PROPOFOL 500 MG/50ML IV EMUL
INTRAVENOUS | Status: DC | PRN
  Administered 2016-12-07: 100 ug/kg/min via INTRAVENOUS

## 2016-12-07 MED ORDER — PROPOFOL 500 MG/50ML IV EMUL
INTRAVENOUS | Status: AC
  Filled 2016-12-07: qty 50

## 2016-12-07 NOTE — Anesthesia Preprocedure Evaluation (Addendum)
Anesthesia Evaluation  Patient identified by MRN, date of birth, ID band Patient awake    Reviewed: Allergy & Precautions, NPO status , Patient's Chart, lab work & pertinent test results, reviewed documented beta blocker date and time   Airway Mallampati: III  TM Distance: >3 FB     Dental  (+) Chipped   Pulmonary shortness of breath, asthma , sleep apnea and Continuous Positive Airway Pressure Ventilation ,           Cardiovascular hypertension, Pt. on medications      Neuro/Psych    GI/Hepatic (+) Hepatitis -  Endo/Other  diabetes, Type 2  Renal/GU      Musculoskeletal  (+) Arthritis ,   Abdominal   Peds  Hematology   Anesthesia Other Findings Eats foods with eggs in them. No problems with previous anesthetics with propofol.  Reproductive/Obstetrics                            Anesthesia Physical Anesthesia Plan  ASA: III  Anesthesia Plan: General   Post-op Pain Management:    Induction: Intravenous  Airway Management Planned:   Additional Equipment:   Intra-op Plan:   Post-operative Plan:   Informed Consent: I have reviewed the patients History and Physical, chart, labs and discussed the procedure including the risks, benefits and alternatives for the proposed anesthesia with the patient or authorized representative who has indicated his/her understanding and acceptance.     Plan Discussed with: CRNA  Anesthesia Plan Comments:         Anesthesia Quick Evaluation

## 2016-12-07 NOTE — Anesthesia Post-op Follow-up Note (Cosign Needed)
Anesthesia QCDR form completed.        

## 2016-12-07 NOTE — Anesthesia Postprocedure Evaluation (Signed)
Anesthesia Post Note  Patient: Jakylah E Kadow  Procedure(s) Performed: Procedure(s) (LRB): COLONOSCOPY WITH PROPOFOL (N/A) ESOPHAGOGASTRODUODENOSCOPY (EGD) WITH PROPOFOL (N/A)  Patient location during evaluation: Endoscopy Anesthesia Type: General Level of consciousness: awake and alert Pain management: pain level controlled Vital Signs Assessment: post-procedure vital signs reviewed and stable Respiratory status: spontaneous breathing, nonlabored ventilation, respiratory function stable and patient connected to nasal cannula oxygen Cardiovascular status: blood pressure returned to baseline and stable Postop Assessment: no signs of nausea or vomiting Anesthetic complications: no     Last Vitals:  Vitals:   12/07/16 0907 12/07/16 0917  BP: 131/73 (!) 144/63  Pulse: 60 61  Resp: 20 20  Temp:      Last Pain:  Vitals:   12/07/16 0847  TempSrc: Tympanic                 Jodean Valade S

## 2016-12-07 NOTE — Op Note (Signed)
[facility] Patient Name: [name] Procedure Date: 12/07/2016 7:25 AM MRN: [id number] Account #: [id number] Date of Birth: 16-Nov-1940 Admit Type: Outpatient Age: 76 Room: [facility] ENDO ROOM 1 Gender: Female Note Status: Finalized Procedure:            Colonoscopy Indications:          Family history of colon cancer in a first-degree                        relative Providers:            Lollie Sails, MD Referring MD:         Ocie Cornfield. Ouida Sills MD, MD (Referring MD) Medicines:            Monitored Anesthesia Care Complications:        No immediate complications. Procedure:            Pre-Anesthesia Assessment:                       - ASA Grade Assessment: III - A patient with severe                        systemic disease.                       After obtaining informed consent, the colonoscope was                        passed under direct vision. Throughout the procedure,                        the patient's blood pressure, pulse, and oxygen                        saturations were monitored continuously. The                        Colonoscope was introduced through the anus and                        advanced to the the cecum, identified by appendiceal                        orifice and ileocecal valve. The colonoscopy was                        performed without difficulty. The patient tolerated the                        procedure well. The quality of the bowel preparation                        was fair. Findings:      The entire examined colon appeared normal.      The retroflexed view of the distal rectum and anal verge was normal and       showed no anal or rectal abnormalities. Impression:           - Preparation of the colon was fair.                       -  The entire examined colon is normal.                       - The distal rectum and anal verge are normal on                        retroflexion view.                       -  No specimens collected. Recommendation:       - Discharge patient to home.                       - Repeat colonoscopy in 5 years for screening purposes. Procedure Code(s):    --- Professional ---                       [phone], Colonoscopy, flexible; diagnostic, including                        collection of specimen(s) by brushing or washing, when                        performed (separate procedure) Diagnosis Code(s):    --- Professional ---                       Z80.0, Family history of malignant neoplasm of                        digestive organs CPT copyright 2016 American Medical Association. All rights reserved. The codes documented in this report are preliminary and upon coder review may  be revised to meet current compliance requirements. Lollie Sails, MD 12/07/2016 8:41:51 AM This report has been signed electronically. Number of Addenda: 0 Note Initiated On: 12/07/2016 7:25 AM Scope Withdrawal Time: 0 hours 6 minutes 33 seconds  Total Procedure Duration: 0 hours 19 minutes 37 seconds       [facility]

## 2016-12-07 NOTE — Op Note (Addendum)
[facility] Gastroenterology Patient Name: [name] Procedure Date: 12/07/2016 7:26 AM MRN: [id number] Account #: [id number] Date of Birth: 26-Feb-1941 Admit Type: Outpatient Age: 76 Room: [facility] ENDO ROOM 1 Gender: Female Note Status: Supervisor Override Procedure:            Upper GI endoscopy Indications:          Gastro-esophageal reflux disease, Failure to respond to                        medical treatment Providers:            Lollie Sails, MD Referring MD:         Ocie Cornfield. Ouida Sills MD, MD (Referring MD) Medicines:            Monitored Anesthesia Care Complications:        No immediate complications. Procedure:            Pre-Anesthesia Assessment:                       - ASA Grade Assessment: III - A patient with severe                        systemic disease.                       After obtaining informed consent, the endoscope was                        passed under direct vision. Throughout the procedure,                        the patient's blood pressure, pulse, and oxygen                        saturations were monitored continuously. The Endoscope                        was introduced through the mouth, and advanced to the                        third part of duodenum. The upper GI endoscopy was                        performed with moderate difficulty. The patient                        tolerated the procedure well. Findings:      The Z-line was variable. Biopsies were taken with a cold forceps for       histology.      A small to medium hiatal hernia was found. The Z-line was a variable       distance from incisors; the hiatal hernia was sliding.      The exam of the esophagus was otherwise normal.      Two 4 to 5 mm semi pedunculated polyps with no bleeding and no stigmata       of recent bleeding were found in the cardia. Biopsies were taken with a       cold forceps for histology.      Patchy mild inflammation characterized by  congestion (edema), erosions  and erythema was found in the gastric antrum. Biopsies were taken with a       cold forceps for histology.      The cardia and gastric fundus were normal on retroflexion otherwise.      The examined duodenum was normal. Impression:           - Z-line variable. Biopsied.                       - Small to medium hiatal hernia.                       - Two gastric polyps. Biopsied.                       - Gastritis. Biopsied.                       - Normal examined duodenum. Recommendation:       - Discharge patient to home.                       - Use Protonix (pantoprazole) 40 mg PO daily daily. Procedure Code(s):    --- Professional ---                       [phone], Esophagogastroduodenoscopy, flexible, transoral;                        with biopsy, single or multiple Diagnosis Code(s):    --- Professional ---                       K22.8, Other specified diseases of esophagus                       K44.9, Diaphragmatic hernia without obstruction or                        gangrene                       K31.7, Polyp of stomach and duodenum                       K29.70, Gastritis, unspecified, without bleeding                       K21.9, Gastro-esophageal reflux disease without                        esophagitis CPT copyright 2016 American Medical Association. All rights reserved. The codes documented in this report are preliminary and upon coder review may  be revised to meet current compliance requirements. Lollie Sails, MD 12/07/2016 8:16:28 AM This report has been signed electronically. Number of Addenda: 0 Note Initiated On: 12/07/2016 7:26 AM      [facility]

## 2016-12-07 NOTE — Transfer of Care (Signed)
Immediate Anesthesia Transfer of Care Note  Patient: [name]  Procedure(s) Performed: Procedure(s): COLONOSCOPY WITH PROPOFOL (N/A) ESOPHAGOGASTRODUODENOSCOPY (EGD) WITH PROPOFOL (N/A)  Patient Location: PACU  Anesthesia Type:General  Level of Consciousness: awake and oriented  Airway & Oxygen Therapy: Patient Spontanous Breathing and Patient connected to nasal cannula oxygen  Post-op Assessment: Report given to RN and Post -op Vital signs reviewed and stable  Post vital signs: Reviewed and stable  Last Vitals:  Vitals:   12/07/16 0712  BP: (!) 167/64  Pulse: 88  Resp: 20  Temp: 36.8 C    Last Pain:  Vitals:   12/07/16 0712  TempSrc: Tympanic         Complications: No apparent anesthesia complications

## 2016-12-07 NOTE — H&P (Signed)
Outpatient short stay form Pre-procedure 12/07/2016 7:35 AM Lollie Sails MD  Primary Physician: Dr. Frazier Richards  Reason for visit:  EGD and colonoscopy  History of present illness:  GERD, family history colon cancer in primary relatives. Patient tolerated her prep well. She takes no blood thinning agents or aspirin products.    Current Facility-Administered Medications:  .  0.9 %  sodium chloride infusion, , Intravenous, Continuous, Lollie Sails, MD .  0.9 %  sodium chloride infusion, , Intravenous, Continuous, Lollie Sails, MD, Last Rate: 20 mL/hr at 12/07/16 6378  Prescriptions Prior to Admission  Medication Sig Dispense Refill Last Dose  . diltiazem (TIAZAC) 360 MG 24 hr capsule TAKE 1 CAPSULE EVERY DAY 30 capsule 3 12/06/2016 at Unknown time  . esomeprazole (NEXIUM) 20 MG capsule Take 20 mg by mouth daily at 12 noon.   12/07/2016 at 0530  . fluticasone (FLOVENT DISKUS) 50 MCG/BLIST diskus inhaler Inhale 2 puffs into the lungs daily.   12/07/2016 at 0530  . losartan-hydrochlorothiazide (HYZAAR) 100-25 MG per tablet Take 1 tablet by mouth daily.   12/07/2016 at 0530  . metFORMIN (GLUCOPHAGE) 500 MG tablet Take 500 mg by mouth daily with breakfast.   12/06/2016 at Unknown time  . montelukast (SINGULAIR) 10 MG tablet Take 10 mg by mouth at bedtime.   12/06/2016 at Unknown time  . rosuvastatin (CRESTOR) 40 MG tablet Take 40 mg by mouth daily.   12/06/2016 at Unknown time  . sertraline (ZOLOFT) 100 MG tablet Take 100 mg by mouth daily.   12/07/2016 at 0530  . zileuton (ZYFLO) 600 MG TABS tablet Take 600 mg by mouth daily.    12/06/2016 at Unknown time  . albuterol (PROVENTIL HFA;VENTOLIN HFA) 108 (90 BASE) MCG/ACT inhaler Inhale 1-2 puffs into the lungs every 4 (four) hours as needed for wheezing or shortness of breath.   Taking  . hydrALAZINE (APRESOLINE) 25 MG tablet Take 25 mg by mouth 3 (three) times daily.   Not Taking at    . imipramine (TOFRANIL) 25 MG tablet Take 25 mg by mouth  daily.   Not Taking at Unknown time  . potassium chloride SA (K-DUR,KLOR-CON) 20 MEQ tablet Take 20 mEq by mouth 2 (two) times daily.   12/04/2016     Allergies  Allergen Reactions  . Ace Inhibitors     Other reaction(s): Unknown  . Eggs Or Egg-Derived Products Diarrhea  . Other     Other reaction(s): Other (See Comments) Eggs  . Prednisone     Other reaction(s): Other (See Comments)  . Risedronate     Other reaction(s): Other (See Comments)  . Sulfa Antibiotics     Other reaction(s): Other (See Comments)  . Sulfasalazine Other (See Comments)     Past Medical History:  Diagnosis Date  . Albuminuria   . Arthritis   . Asthma   . Cataract cortical, senile   . Diabetes mellitus without complication ([place])   . Hemorrhoids   . Hyperlipidemia   . Hypertension   . Lyme disease   . 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 OSA (obstructive sleep apnea)   . Osteoporosis   . Steatohepatitis   . Steatohepatitis     Review of systems:      Physical Exam    Heart and lungs: Regular rate and rhythm without rub or gallop, lungs are bilaterally clear.    HEENT: Normocephalic atraumatic eyes are anicteric    Other:     Pertinant exam for procedure:  Soft nontender nondistended bowel sounds positive normoactive.    Planned proceedures: EGD, colonoscopy and indicated procedures. I have discussed the risks benefits and complications of procedures to include not limited to bleeding, infection, perforation and the risk of sedation and the patient wishes to proceed.    Lollie Sails, MD Gastroenterology 12/07/2016  7:35 AM

## 2016-12-08 ENCOUNTER — Encounter: Payer: Self-pay | Admitting: Gastroenterology

## 2016-12-08 LAB — SURGICAL PATHOLOGY

## 2016-12-29 ENCOUNTER — Inpatient Hospital Stay
Admission: EM | Admit: 2016-12-29 | Discharge: 2016-12-31 | DRG: 392 | Disposition: A | Discharge status: Home / Self Care | Payer: Medicare Other | Attending: Internal Medicine | Admitting: Internal Medicine

## 2016-12-29 ENCOUNTER — Encounter: Payer: Self-pay | Admitting: Emergency Medicine

## 2016-12-29 ENCOUNTER — Emergency Department: Payer: Medicare Other

## 2016-12-29 DIAGNOSIS — M810 Age-related osteoporosis without current pathological fracture: Secondary | ICD-10-CM | POA: Diagnosis present

## 2016-12-29 DIAGNOSIS — J45909 Unspecified asthma, uncomplicated: Secondary | ICD-10-CM | POA: Diagnosis present

## 2016-12-29 DIAGNOSIS — R1111 Vomiting without nausea: Secondary | ICD-10-CM

## 2016-12-29 DIAGNOSIS — R197 Diarrhea, unspecified: Secondary | ICD-10-CM

## 2016-12-29 DIAGNOSIS — Z7951 Long term (current) use of inhaled steroids: Secondary | ICD-10-CM

## 2016-12-29 DIAGNOSIS — Z7984 Long term (current) use of oral hypoglycemic drugs: Secondary | ICD-10-CM

## 2016-12-29 DIAGNOSIS — N390 Urinary tract infection, site not specified: Secondary | ICD-10-CM

## 2016-12-29 DIAGNOSIS — R1110 Vomiting, unspecified: Secondary | ICD-10-CM

## 2016-12-29 DIAGNOSIS — E119 Type 2 diabetes mellitus without complications: Secondary | ICD-10-CM | POA: Diagnosis present

## 2016-12-29 DIAGNOSIS — K529 Noninfective gastroenteritis and colitis, unspecified: Secondary | ICD-10-CM | POA: Diagnosis not present

## 2016-12-29 DIAGNOSIS — I10 Essential (primary) hypertension: Secondary | ICD-10-CM | POA: Diagnosis present

## 2016-12-29 DIAGNOSIS — E876 Hypokalemia: Secondary | ICD-10-CM | POA: Diagnosis present

## 2016-12-29 DIAGNOSIS — E669 Obesity, unspecified: Secondary | ICD-10-CM | POA: Diagnosis present

## 2016-12-29 DIAGNOSIS — Z882 Allergy status to sulfonamides status: Secondary | ICD-10-CM

## 2016-12-29 DIAGNOSIS — G4733 Obstructive sleep apnea (adult) (pediatric): Secondary | ICD-10-CM | POA: Diagnosis present

## 2016-12-29 DIAGNOSIS — E785 Hyperlipidemia, unspecified: Secondary | ICD-10-CM | POA: Diagnosis present

## 2016-12-29 DIAGNOSIS — Z6836 Body mass index (BMI) 36.0-36.9, adult: Secondary | ICD-10-CM

## 2016-12-29 DIAGNOSIS — Z91012 Allergy to eggs: Secondary | ICD-10-CM

## 2016-12-29 DIAGNOSIS — Z888 Allergy status to other drugs, medicaments and biological substances status: Secondary | ICD-10-CM

## 2016-12-29 LAB — GASTROINTESTINAL PANEL BY PCR, STOOL (REPLACES STOOL CULTURE)
Adenovirus F40/41: NOT DETECTED
Astrovirus: NOT DETECTED
CRYPTOSPORIDIUM: NOT DETECTED
CYCLOSPORA CAYETANENSIS: NOT DETECTED
Campylobacter species: NOT DETECTED
ENTAMOEBA HISTOLYTICA: NOT DETECTED
Enteroaggregative E coli (EAEC): NOT DETECTED
Enteropathogenic E coli (EPEC): NOT DETECTED
Enterotoxigenic E coli (ETEC): NOT DETECTED
GIARDIA LAMBLIA: NOT DETECTED
NOROVIRUS GI/GII: NOT DETECTED
Plesimonas shigelloides: NOT DETECTED
Rotavirus A: NOT DETECTED
SAPOVIRUS (I, II, IV, AND V): NOT DETECTED
SHIGA LIKE TOXIN PRODUCING E COLI (STEC): NOT DETECTED
SHIGELLA/ENTEROINVASIVE E COLI (EIEC): NOT DETECTED
Salmonella species: NOT DETECTED
VIBRIO SPECIES: NOT DETECTED
Vibrio cholerae: NOT DETECTED
YERSINIA ENTEROCOLITICA: NOT DETECTED

## 2016-12-29 LAB — URINALYSIS, COMPLETE (UACMP) WITH MICROSCOPIC
BILIRUBIN URINE: NEGATIVE
Glucose, UA: NEGATIVE mg/dL
Ketones, ur: NEGATIVE mg/dL
NITRITE: NEGATIVE
PH: 5 (ref 5.0–8.0)
Protein, ur: 100 mg/dL — AB
SPECIFIC GRAVITY, URINE: 1.036 — AB (ref 1.005–1.030)

## 2016-12-29 LAB — C DIFFICILE QUICK SCREEN W PCR REFLEX
C DIFFICLE (CDIFF) ANTIGEN: NEGATIVE
C Diff interpretation: NOT DETECTED
C Diff toxin: NEGATIVE

## 2016-12-29 LAB — COMPREHENSIVE METABOLIC PANEL
ALT: 26 U/L (ref 14–54)
AST: 30 U/L (ref 15–41)
Albumin: 3.9 g/dL (ref 3.5–5.0)
Alkaline Phosphatase: 55 U/L (ref 38–126)
Anion gap: 11 (ref 5–15)
BILIRUBIN TOTAL: 0.8 mg/dL (ref 0.3–1.2)
BUN: 23 mg/dL — AB (ref 6–20)
CO2: 20 mmol/L — ABNORMAL LOW (ref 22–32)
CREATININE: 0.66 mg/dL (ref 0.44–1.00)
Calcium: 9.2 mg/dL (ref 8.9–10.3)
Chloride: 108 mmol/L (ref 101–111)
GFR calc Af Amer: >60 mL/min (ref >60)
Glucose, Bld: 172 mg/dL — ABNORMAL HIGH (ref 65–99)
POTASSIUM: 3.5 mmol/L (ref 3.5–5.1)
Sodium: 139 mmol/L (ref 135–145)
TOTAL PROTEIN: 6.9 g/dL (ref 6.5–8.1)

## 2016-12-29 LAB — CBC
HCT: 40.6 % (ref 35.0–47.0)
Hemoglobin: 13.8 g/dL (ref 12.0–16.0)
MCH: 28.7 pg (ref 26.0–34.0)
MCHC: 34 g/dL (ref 32.0–36.0)
MCV: 84.4 fL (ref 80.0–100.0)
PLATELETS: 185 10*3/uL (ref 150–440)
RBC: 4.81 MIL/uL (ref 3.80–5.20)
RDW: 13.7 % (ref 11.5–14.5)
WBC: 11.8 10*3/uL — AB (ref 3.6–11.0)

## 2016-12-29 LAB — TROPONIN I: Troponin I: <0.03 ng/mL (ref <0.03)

## 2016-12-29 LAB — LIPASE, BLOOD: Lipase: 19 U/L (ref 11–51)

## 2016-12-29 MED ORDER — IOPAMIDOL (ISOVUE-300) INJECTION 61%
100.0000 mL | Freq: Once | INTRAVENOUS | Status: AC | PRN
  Administered 2016-12-29: 100 mL via INTRAVENOUS

## 2016-12-29 MED ORDER — ROSUVASTATIN CALCIUM 10 MG PO TABS
40.0000 mg | ORAL_TABLET | Freq: Every day | ORAL | Status: DC
  Administered 2016-12-30 – 2016-12-31 (×2): 40 mg via ORAL
  Filled 2016-12-29 (×2): qty 4

## 2016-12-29 MED ORDER — HYDROCHLOROTHIAZIDE 25 MG PO TABS
25.0000 mg | ORAL_TABLET | Freq: Every day | ORAL | Status: DC
  Administered 2016-12-29 – 2016-12-31 (×3): 25 mg via ORAL
  Filled 2016-12-29 (×4): qty 1

## 2016-12-29 MED ORDER — SERTRALINE HCL 100 MG PO TABS
100.0000 mg | ORAL_TABLET | Freq: Every day | ORAL | Status: DC
  Administered 2016-12-30 – 2016-12-31 (×2): 100 mg via ORAL
  Filled 2016-12-29 (×3): qty 1

## 2016-12-29 MED ORDER — METRONIDAZOLE IN NACL 5-0.79 MG/ML-% IV SOLN
500.0000 mg | Freq: Three times a day (TID) | INTRAVENOUS | Status: DC
  Administered 2016-12-29 – 2016-12-31 (×6): 500 mg via INTRAVENOUS
  Filled 2016-12-29 (×9): qty 100

## 2016-12-29 MED ORDER — SODIUM CHLORIDE 0.9 % IV SOLN
INTRAVENOUS | Status: DC
  Administered 2016-12-29: 18:00:00 via INTRAVENOUS

## 2016-12-29 MED ORDER — ONDANSETRON HCL 4 MG/2ML IJ SOLN
4.0000 mg | Freq: Once | INTRAMUSCULAR | Status: AC
  Administered 2016-12-29: 4 mg via INTRAVENOUS

## 2016-12-29 MED ORDER — SODIUM CHLORIDE 0.9 % IV BOLUS (SEPSIS)
500.0000 mL | Freq: Once | INTRAVENOUS | Status: AC
  Administered 2016-12-29: 500 mL via INTRAVENOUS

## 2016-12-29 MED ORDER — PANTOPRAZOLE SODIUM 40 MG PO TBEC
40.0000 mg | DELAYED_RELEASE_TABLET | Freq: Every day | ORAL | Status: DC
  Administered 2016-12-30 – 2016-12-31 (×2): 40 mg via ORAL
  Filled 2016-12-29 (×2): qty 1

## 2016-12-29 MED ORDER — IOPAMIDOL (ISOVUE-300) INJECTION 61%
30.0000 mL | Freq: Once | INTRAVENOUS | Status: AC | PRN
  Administered 2016-12-29: 30 mL via ORAL

## 2016-12-29 MED ORDER — ALUM & MAG HYDROXIDE-SIMETH 200-200-20 MG/5ML PO SUSP: 15.0000 mL | ORAL | Status: DC | PRN

## 2016-12-29 MED ORDER — DOCUSATE SODIUM 100 MG PO CAPS: 100.0000 mg | ORAL_CAPSULE | Freq: Two times a day (BID) | ORAL | Status: DC | PRN

## 2016-12-29 MED ORDER — ONDANSETRON HCL 4 MG/2ML IJ SOLN
4.0000 mg | Freq: Once | INTRAMUSCULAR | Status: AC
  Administered 2016-12-29: 4 mg via INTRAVENOUS
  Filled 2016-12-29: qty 2

## 2016-12-29 MED ORDER — DILTIAZEM HCL ER BEADS 240 MG PO CP24
360.0000 mg | ORAL_CAPSULE | Freq: Every day | ORAL | Status: DC
  Administered 2016-12-30 – 2016-12-31 (×2): 360 mg via ORAL
  Filled 2016-12-29 (×5): qty 1

## 2016-12-29 MED ORDER — ONDANSETRON HCL 4 MG/2ML IJ SOLN
INTRAMUSCULAR | Status: AC
  Administered 2016-12-29: 4 mg via INTRAVENOUS
  Filled 2016-12-29: qty 2

## 2016-12-29 MED ORDER — HEPARIN SODIUM (PORCINE) 5000 UNIT/ML IJ SOLN
5000.0000 [IU] | Freq: Three times a day (TID) | INTRAMUSCULAR | Status: DC
  Administered 2016-12-29 – 2016-12-31 (×6): 5000 [IU] via SUBCUTANEOUS
  Filled 2016-12-29 (×6): qty 1

## 2016-12-29 MED ORDER — LOPERAMIDE HCL 2 MG PO CAPS
2.0000 mg | ORAL_CAPSULE | ORAL | Status: DC | PRN
  Administered 2016-12-29: 2 mg via ORAL
  Filled 2016-12-29: qty 1

## 2016-12-29 MED ORDER — CYCLOBENZAPRINE HCL 10 MG PO TABS: 10.0000 mg | ORAL_TABLET | Freq: Three times a day (TID) | ORAL | Status: DC | PRN

## 2016-12-29 MED ORDER — CALCIUM CARBONATE ANTACID 500 MG PO CHEW
2.5000 | CHEWABLE_TABLET | Freq: Two times a day (BID) | ORAL | Status: DC
  Administered 2016-12-30 – 2016-12-31 (×3): 500 mg via ORAL
  Filled 2016-12-29 (×3): qty 1
  Filled 2016-12-29: qty 3

## 2016-12-29 MED ORDER — LOSARTAN POTASSIUM 50 MG PO TABS
100.0000 mg | ORAL_TABLET | Freq: Every day | ORAL | Status: DC
  Administered 2016-12-29 – 2016-12-31 (×3): 100 mg via ORAL
  Filled 2016-12-29 (×4): qty 2

## 2016-12-29 MED ORDER — ONDANSETRON HCL 4 MG/2ML IJ SOLN
4.0000 mg | Freq: Four times a day (QID) | INTRAMUSCULAR | Status: DC | PRN
  Administered 2016-12-29 – 2016-12-30 (×4): 4 mg via INTRAVENOUS
  Filled 2016-12-29 (×4): qty 2

## 2016-12-29 MED ORDER — LEVOFLOXACIN IN D5W 750 MG/150ML IV SOLN
750.0000 mg | Freq: Once | INTRAVENOUS | Status: AC
  Administered 2016-12-29: 750 mg via INTRAVENOUS
  Filled 2016-12-29: qty 150

## 2016-12-29 MED ORDER — ALBUTEROL SULFATE (2.5 MG/3ML) 0.083% IN NEBU: 2.5000 mg | INHALATION_SOLUTION | RESPIRATORY_TRACT | Status: DC | PRN

## 2016-12-29 MED ORDER — LOSARTAN POTASSIUM-HCTZ 100-25 MG PO TABS: 1.0000 | ORAL_TABLET | Freq: Every day | ORAL | Status: DC

## 2016-12-29 MED ORDER — CIPROFLOXACIN IN D5W 400 MG/200ML IV SOLN
400.0000 mg | Freq: Two times a day (BID) | INTRAVENOUS | Status: DC
  Administered 2016-12-29 – 2016-12-31 (×4): 400 mg via INTRAVENOUS
  Filled 2016-12-29 (×5): qty 200

## 2016-12-29 MED ORDER — MOMETASONE FURO-FORMOTEROL FUM 200-5 MCG/ACT IN AERO
2.0000 | INHALATION_SPRAY | Freq: Two times a day (BID) | RESPIRATORY_TRACT | Status: DC
  Administered 2016-12-29 – 2016-12-31 (×4): 2 via RESPIRATORY_TRACT
  Filled 2016-12-29: qty 8.8

## 2016-12-29 MED ORDER — MONTELUKAST SODIUM 10 MG PO TABS
10.0000 mg | ORAL_TABLET | Freq: Every day | ORAL | Status: DC
  Administered 2016-12-29 – 2016-12-30 (×2): 10 mg via ORAL
  Filled 2016-12-29 (×2): qty 1

## 2016-12-29 NOTE — ED Triage Notes (Signed)
States had colonoscopy March 4 and had had diarrhea since. Denies having diarrhea prior to scope. States this am has nausea with vomiting.

## 2016-12-29 NOTE — ED Notes (Signed)
Spoke with Suezanne Jacquet in [place] informed him that pt states that she is allergic to kiwi and it makes her vomit.  Suezanne Jacquet will call and talk with Dr. Reita Cliche about doing CT without oral contrast

## 2016-12-29 NOTE — ED Notes (Signed)
While RN in room, pt O2 noted to drop to 89% while pt was dozing off, pt instructed to take deep breaths. Pt report hx/o sleep apnea and states that she uses a CPAP at home.  Pt O2 sat returned to 95 % on room air

## 2016-12-29 NOTE — ED Notes (Signed)
Pt ambulated to restroom without difficulty

## 2016-12-29 NOTE — ED Provider Notes (Signed)
[facility] Emergency Department Provider Note ____________________________________________   I have reviewed the triage vital signs and the triage nursing note.  HISTORY  Chief Complaint Diarrhea   Historian Patient  HPI [name] is a 76 y.o. female here for diarrhea x about 3-4 weeks, since colonoscopy and endoscopy at [facility] first week of March.  Watery and nonbloody diarrhea. Starting yesterday she started having nausea and she's had vomiting overnight. First started off as green and now nausea with dry heaving.  No fever. No chest pain. No palpitations. No cough or congestion. States that she's had gallbladder removed. Reports a little bit of abdominal discomfort over to the right but then states it's not actually pain is more so nausea.    Past Medical History:  Diagnosis Date  . Albuminuria   . Arthritis   . Asthma   . Cataract cortical, senile   . Diabetes mellitus without complication ([place])   . Hemorrhoids   . Hyperlipidemia   . Hypertension   . Lyme disease   . 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 OSA (obstructive sleep apnea)   . Osteoporosis   . Steatohepatitis   . Steatohepatitis     Patient Active Problem List   Diagnosis Date Noted  . Precordial pain 07/24/2014  . Dyspnea 07/24/2014  . Palpitations 07/24/2014  . Essential hypertension 07/24/2014  . Hyperlipidemia 07/24/2014    Past Surgical History:  Procedure Laterality Date  . ABDOMINAL HYSTERECTOMY    . APPENDECTOMY    . [place]  . CARDIAC CATHETERIZATION  08/13/2014   [facility]. no significant CAD, normal LVEDP.   Marland Kitchen CATARACT EXTRACTION    . CHOLECYSTECTOMY    . COLONOSCOPY    . COLONOSCOPY WITH PROPOFOL N/A 12/07/2016   Procedure: COLONOSCOPY WITH PROPOFOL;  Surgeon: Lollie Sails, MD;  Location: [facility] ENDOSCOPY;  Service: Endoscopy;  Laterality: N/A;  . ESOPHAGOGASTRODUODENOSCOPY    . ESOPHAGOGASTRODUODENOSCOPY (EGD) WITH PROPOFOL N/A 12/07/2016    Procedure: ESOPHAGOGASTRODUODENOSCOPY (EGD) WITH PROPOFOL;  Surgeon: Lollie Sails, MD;  Location: [facility] ENDOSCOPY;  Service: Endoscopy;  Laterality: N/A;  . HEMORRHOID SURGERY    . PARTIAL HYSTERECTOMY    . TONSILLECTOMY      Prior to Admission medications   Medication Sig Start Date End Date Taking? Authorizing Provider  calcium carbonate (OS-CAL - DOSED IN MG OF ELEMENTAL CALCIUM) 1250 (500 Ca) MG tablet Take 1 tablet by mouth.   Yes Historical Provider, MD  diltiazem (TIAZAC) 360 MG 24 hr capsule TAKE 1 CAPSULE EVERY DAY 06/28/15  Yes Wellington Hampshire, MD  Fluticasone-Salmeterol (ADVAIR) 250-50 MCG/DOSE AEPB Inhale 1 puff into the lungs 2 (two) times daily.   Yes Historical Provider, MD  losartan-hydrochlorothiazide (HYZAAR) 100-25 MG per tablet Take 1 tablet by mouth daily.   Yes Historical Provider, MD  metFORMIN (GLUCOPHAGE) 500 MG tablet Take 500 mg by mouth 2 (two) times daily with a meal.    Yes Historical Provider, MD  montelukast (SINGULAIR) 10 MG tablet Take 10 mg by mouth at bedtime.   Yes Historical Provider, MD  pantoprazole (PROTONIX) 40 MG tablet Take 40 mg by mouth daily.   Yes Historical Provider, MD  potassium chloride SA (K-DUR,KLOR-CON) 20 MEQ tablet Take 20 mEq by mouth 2 (two) times daily.   Yes Historical Provider, MD  rosuvastatin (CRESTOR) 40 MG tablet Take 40 mg by mouth daily.   Yes Historical Provider, MD  sertraline (ZOLOFT) 100 MG tablet Take 100 mg  by mouth daily.   Yes Historical Provider, MD  albuterol (PROVENTIL HFA;VENTOLIN HFA) 108 (90 BASE) MCG/ACT inhaler Inhale 1-2 puffs into the lungs every 4 (four) hours as needed for wheezing or shortness of breath.    Historical Provider, MD  cyclobenzaprine (FLEXERIL) 10 MG tablet Take 10 mg by mouth.    Historical Provider, MD    Allergies  Allergen Reactions  . Ace Inhibitors     Other reaction(s): Unknown  . Eggs Or Egg-Derived Products Diarrhea  . Other     Other reaction(s): Other (See Comments) Eggs   . Prednisone     Other reaction(s): Other (See Comments) joint pain  . Risedronate     Other reaction(s): Other (See Comments)  . Sulfa Antibiotics     Other reaction(s): Other (See Comments)  . Sulfasalazine Other (See Comments)    Family History  Problem Relation Age of Onset  . Heart attack Father     Social History Social History  Substance Use Topics  . Smoking status: Never Smoker  . Smokeless tobacco: Never Used  . Alcohol use No    Review of Systems  Constitutional: Negative for fever. Eyes: Negative for visual changes. ENT: Negative for sore throat. Cardiovascular: Negative for chest pain. Respiratory: Negative for shortness of breath. Gastrointestinal: As per history of present illness. No bloody emesis or bloody stool. Genitourinary: Negative for dysuria. Musculoskeletal: Negative for back pain. Skin: Negative for rash. Neurological: Negative for headache. 10 point Review of Systems otherwise negative ____________________________________________   PHYSICAL EXAM:  VITAL SIGNS: ED Triage Vitals  Enc Vitals Group     BP 12/29/16 0724 (!) 200/79     Pulse Rate 12/29/16 0724 66     Resp 12/29/16 0724 18     Temp 12/29/16 0724 97.8 F (36.6 C)     Temp Source 12/29/16 0724 Oral     SpO2 12/29/16 0724 94 %     Weight 12/29/16 0727 195 lb (88.5 kg)     Height 12/29/16 0727 5' 2"  (1.575 m)     Head Circumference --      Peak Flow --      Pain Score --      Pain Loc --      Pain Edu? --      Excl. in [place]? --      Constitutional: Alert and oriented. Well appearing and in no distress. HEENT   Head: Normocephalic and atraumatic.      Eyes: Conjunctivae are normal. PERRL. Normal extraocular movements.      Ears:         Nose: No congestion/rhinnorhea.   Mouth/Throat: Mucous membranes are moist.   Neck: No stridor. Cardiovascular/Chest: Normal rate, regular rhythm.  No murmurs, rubs, or gallops. Respiratory: Normal respiratory effort without  tachypnea nor retractions. Breath sounds are clear and equal bilaterally. No wheezes/rales/rhonchi. Gastrointestinal: Soft. Obese. Show mild distention.  No guarding, no rebound. Nontender.   Genitourinary/rectal:Deferred Musculoskeletal: Nontender with normal range of motion in all extremities. No joint effusions.  No lower extremity tenderness.  No edema. Neurologic:  Normal speech and language. No gross or focal neurologic deficits are appreciated. Skin:  Skin is warm, dry and intact. No rash noted. Psychiatric: Mood and affect are normal. Speech and behavior are normal. Patient exhibits appropriate insight and judgment.   ____________________________________________  LABS (pertinent positives/negatives)  Labs Reviewed  COMPREHENSIVE METABOLIC PANEL - Abnormal; Notable for the following:       Result Value   CO2  20 (*)    Glucose, Bld 172 (*)    BUN 23 (*)    All other components within normal limits  CBC - Abnormal; Notable for the following:    WBC 11.8 (*)    All other components within normal limits  URINALYSIS, COMPLETE (UACMP) WITH MICROSCOPIC - Abnormal; Notable for the following:    Color, Urine YELLOW (*)    APPearance HAZY (*)    Specific Gravity, Urine 1.036 (*)    Hgb urine dipstick SMALL (*)    Protein, ur 100 (*)    Leukocytes, UA TRACE (*)    Bacteria, UA RARE (*)    Squamous Epithelial / LPF 0-5 (*)    All other components within normal limits  URINE CULTURE  LIPASE, BLOOD  TROPONIN I    ____________________________________________    EKG I, Lisa Roca, MD, the attending physician have personally viewed and interpreted all ECGs.  61 bpm. Narrow QRS. Normal axis. T-wave flattening laterally. ____________________________________________  RADIOLOGY All Xrays were viewed by me. Imaging interpreted by Radiologist.  Ct abd and pelvis with contrast:  IMPRESSION: Multiple loops of jejunum show mildly thickened walls with slight hyperemia consistent  with a degree of enteritis. No bowel obstruction. No abscess. There are sigmoid diverticula without diverticulitis.  Focal hiatal hernia. Minimal ventral hernia containing only fat.  Prominent liver without focal liver lesion. There is biliary duct dilatation, likely due to post cholecystectomy state.  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  Aortoiliac atherosclerosis noted. __________________________________________  PROCEDURES  Procedure(s) performed: None  Critical Care performed: None  ____________________________________________   ED COURSE / ASSESSMENT AND PLAN  Pertinent labs & imaging results that were available during my care of the patient were reviewed by me and considered in my medical decision making (see chart for details).   Overall well appearing with stable vital signs, but she states she just feels bad and is sick of the diarrhea.  She states she initially thought it might have been her "stomach medicine" but she stopped and then restarted it because there is no change in the diarrhea.  She has not been back to the GI doctor but she did communicate with them about stopping or starting the stomach medicine on account of the nausea and diarrhea.  Elevated wbc.  Worsening symptoms from just diarrhea to vomiting and diarrhea -- discussed ct abd with patient.  Urinalysis consistent with urinary tract infection. CT of the abdomen and pelvis shows some inflammatory signs for enteritis. Given this I am going to choose Levaquin to cover for both the urinary source and possibly intestinal source.  She did have 2 loose bowel movements here in the emergency department, neither of which were contacted for testing.   Recheck at 1:30, patient still looks pretty miserable and nauseated. I am going to admit her with her elevated white blood cell count and urine tract infection and persistent nausea/vomiting.   CONSULTATIONS:   Hospitalist for  admission.   Patient / Family / Caregiver informed of clinical course, medical decision-making process, and agree with plan.   ___________________________________________   FINAL CLINICAL IMPRESSION(S) / ED DIAGNOSES   Final diagnoses:  Urinary tract infection without hematuria, site unspecified  Diarrhea, unspecified type  Intractable vomiting without nausea, unspecified vomiting type              Note: This dictation was prepared with Dragon dictation. Any transcriptional errors that result from this process are unintentional    Wells Guiles  Justise Ehmann, MD 12/29/16 1400

## 2016-12-29 NOTE — H&P (Signed)
[facility] NAME: [name]    MR#:  [id number]  DATE OF BIRTH:  04/02/1941  DATE OF ADMISSION:  12/29/2016  PRIMARY CARE PHYSICIAN: Kirk Ruths., MD   REQUESTING/REFERRING PHYSICIAN: Reita Cliche  CHIEF COMPLAINT:   Chief Complaint  Patient presents with  . Diarrhea    HISTORY OF PRESENT ILLNESS: [name]  is a 76 y.o. female with a known history of Arthritis, asthma, diabetes, hemorrhoids, hyperlipidemia, hypertension, obstructive sleep apnea, osteoporosis- had sinusitis or bronchitis 1 month ago and was given some antibiotics. She also had an endoscopy and colonoscopy done 3 weeks ago when she was found to have esophagitis and 2 nonmalignant polyps were removed from her colon. She was given overall pantoprazole once a day prescription and advised to have repeat endoscopy after 3 months. Since the procedures she is having loose stools and frequent accidents with her stool while she also feels nauseated. She reported having sometimes one episode a day and sometimes 3-4 episodes a day off watery stool. She also have some vague right-sided abdominal pain along with this going on for a few weeks. She denies any associated fever or chills or any urinary symptoms. Today she was feeling excessive nauseated and she also vomited once or twice so finally decided to come to emergency room for this, CT scan of the abdomen showed some thickening of the wall in her small intestine in and her urinalysis was having some few WBCs.  PAST MEDICAL HISTORY:   Past Medical History:  Diagnosis Date  . Albuminuria   . Arthritis   . Asthma   . Cataract cortical, senile   . Diabetes mellitus without complication ([place])   . Hemorrhoids   . Hyperlipidemia   . Hypertension   . Lyme disease   . 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 OSA (obstructive sleep apnea)   . Osteoporosis   . Steatohepatitis   . Steatohepatitis     PAST SURGICAL HISTORY: Past Surgical  History:  Procedure Laterality Date  . ABDOMINAL HYSTERECTOMY    . APPENDECTOMY    . [place]  . CARDIAC CATHETERIZATION  08/13/2014   [facility]. no significant CAD, normal LVEDP.   Marland Kitchen CATARACT EXTRACTION    . CHOLECYSTECTOMY    . COLONOSCOPY    . COLONOSCOPY WITH PROPOFOL N/A 12/07/2016   Procedure: COLONOSCOPY WITH PROPOFOL;  Surgeon: Lollie Sails, MD;  Location: [facility] ENDOSCOPY;  Service: Endoscopy;  Laterality: N/A;  . ESOPHAGOGASTRODUODENOSCOPY    . ESOPHAGOGASTRODUODENOSCOPY (EGD) WITH PROPOFOL N/A 12/07/2016   Procedure: ESOPHAGOGASTRODUODENOSCOPY (EGD) WITH PROPOFOL;  Surgeon: Lollie Sails, MD;  Location: [facility] ENDOSCOPY;  Service: Endoscopy;  Laterality: N/A;  . HEMORRHOID SURGERY    . PARTIAL HYSTERECTOMY    . TONSILLECTOMY      SOCIAL HISTORY:  Social History  Substance Use Topics  . Smoking status: Never Smoker  . Smokeless tobacco: Never Used  . Alcohol use No    FAMILY HISTORY:  Family History  Problem Relation Age of Onset  . Heart attack Father     DRUG ALLERGIES:  Allergies  Allergen Reactions  . Ace Inhibitors     Other reaction(s): Unknown  . Eggs Or Egg-Derived Products Diarrhea  . Other     Other reaction(s): Other (See Comments) Eggs  . Prednisone     Other reaction(s): Other (See Comments) joint pain  . Risedronate     Other reaction(s): Other (See Comments)  .  Sulfa Antibiotics     Other reaction(s): Other (See Comments)  . Sulfasalazine Other (See Comments)    REVIEW OF SYSTEMS:   CONSTITUTIONAL: No fever, fatigue or weakness.  EYES: No blurred or double vision.  EARS, NOSE, AND THROAT: No tinnitus or ear pain.  RESPIRATORY: No cough, shortness of breath, wheezing or hemoptysis.  CARDIOVASCULAR: No chest pain, orthopnea, edema.  GASTROINTESTINAL: Positive for nausea, vomiting, diarrhea or abdominal pain.  GENITOURINARY: No dysuria, hematuria.  ENDOCRINE: No polyuria, nocturia,  HEMATOLOGY: No anemia,  easy bruising or bleeding SKIN: No rash or lesion. MUSCULOSKELETAL: No joint pain or arthritis.   NEUROLOGIC: No tingling, numbness, weakness.  PSYCHIATRY: No anxiety or depression.   MEDICATIONS AT HOME:  Prior to Admission medications   Medication Sig Start Date End Date Taking? Authorizing Provider  calcium carbonate (OS-CAL - DOSED IN MG OF ELEMENTAL CALCIUM) 1250 (500 Ca) MG tablet Take 1 tablet by mouth.   Yes Historical Provider, MD  diltiazem (TIAZAC) 360 MG 24 hr capsule TAKE 1 CAPSULE EVERY DAY 06/28/15  Yes Wellington Hampshire, MD  Fluticasone-Salmeterol (ADVAIR) 250-50 MCG/DOSE AEPB Inhale 1 puff into the lungs 2 (two) times daily.   Yes Historical Provider, MD  losartan-hydrochlorothiazide (HYZAAR) 100-25 MG per tablet Take 1 tablet by mouth daily.   Yes Historical Provider, MD  metFORMIN (GLUCOPHAGE) 500 MG tablet Take 500 mg by mouth 2 (two) times daily with a meal.    Yes Historical Provider, MD  montelukast (SINGULAIR) 10 MG tablet Take 10 mg by mouth at bedtime.   Yes Historical Provider, MD  pantoprazole (PROTONIX) 40 MG tablet Take 40 mg by mouth daily.   Yes Historical Provider, MD  potassium chloride SA (K-DUR,KLOR-CON) 20 MEQ tablet Take 20 mEq by mouth 2 (two) times daily.   Yes Historical Provider, MD  rosuvastatin (CRESTOR) 40 MG tablet Take 40 mg by mouth daily.   Yes Historical Provider, MD  sertraline (ZOLOFT) 100 MG tablet Take 100 mg by mouth daily.   Yes Historical Provider, MD  albuterol (PROVENTIL HFA;VENTOLIN HFA) 108 (90 BASE) MCG/ACT inhaler Inhale 1-2 puffs into the lungs every 4 (four) hours as needed for wheezing or shortness of breath.    Historical Provider, MD  cyclobenzaprine (FLEXERIL) 10 MG tablet Take 10 mg by mouth.    Historical Provider, MD      PHYSICAL EXAMINATION:   VITAL SIGNS: Blood pressure (!) 149/66, pulse 78, temperature 97.8 F (36.6 C), temperature source Oral, resp. rate (!) 24, height 5' 2"  (1.575 m), weight 88.5 kg (195 lb), SpO2  94 %.  GENERAL:  76 y.o.-year-old patient lying in the bed with no acute distress.  EYES: Pupils equal, round, reactive to light and accommodation. No scleral icterus. Extraocular muscles intact.  HEENT: Head atraumatic, normocephalic. Oropharynx and nasopharynx clear.  NECK:  Supple, no jugular venous distention. No thyroid enlargement, no tenderness.  LUNGS: Normal breath sounds bilaterally, no wheezing, rales,rhonchi or crepitation. No use of accessory muscles of respiration.  CARDIOVASCULAR: S1, S2 normal. No murmurs, rubs, or gallops.  ABDOMEN: Soft, mild right sided tender, nondistended. Bowel sounds present. No organomegaly or mass.  EXTREMITIES: No pedal edema, cyanosis, or clubbing.  NEUROLOGIC: Cranial nerves II through XII are intact. Muscle strength 5/5 in all extremities. Sensation intact. Gait not checked.  PSYCHIATRIC: The patient is alert and oriented x 3.  SKIN: No obvious rash, lesion, or ulcer.   LABORATORY PANEL:   CBC  Recent Labs Lab 12/29/16 0747  WBC 11.8*  HGB 13.8  HCT 40.6  PLT 185  MCV 84.4  MCH 28.7  MCHC 34.0  RDW 13.7   ------------------------------------------------------------------------------------------------------------------  Chemistries   Recent Labs Lab 12/29/16 0747  NA 139  K 3.5  CL 108  CO2 20*  GLUCOSE 172*  BUN 23*  CREATININE 0.66  CALCIUM 9.2  AST 30  ALT 26  ALKPHOS 55  BILITOT 0.8   ------------------------------------------------------------------------------------------------------------------ estimated creatinine clearance is 61.9 mL/min (by C-G formula based on SCr of 0.66 mg/dL). ------------------------------------------------------------------------------------------------------------------ No results for input(s): TSH, T4TOTAL, T3FREE, THYROIDAB in the last 72 hours.  Invalid input(s): FREET3   Coagulation profile No results for input(s): INR, PROTIME in the last 168  hours. ------------------------------------------------------------------------------------------------------------------- No results for input(s): DDIMER in the last 72 hours. -------------------------------------------------------------------------------------------------------------------  Cardiac Enzymes  Recent Labs Lab 12/29/16 0747  TROPONINI <0.03   ------------------------------------------------------------------------------------------------------------------ Invalid input(s): POCBNP  ---------------------------------------------------------------------------------------------------------------  Urinalysis    Component Value Date/Time   COLORURINE YELLOW (A) 12/29/2016 1011   APPEARANCEUR HAZY (A) 12/29/2016 1011   APPEARANCEUR Hazy 06/20/2012 1508   LABSPEC 1.036 (H) 12/29/2016 1011   LABSPEC 1.013 06/20/2012 1508   PHURINE 5.0 12/29/2016 1011   GLUCOSEU NEGATIVE 12/29/2016 1011   GLUCOSEU Negative 06/20/2012 1508   HGBUR SMALL (A) 12/29/2016 1011   BILIRUBINUR NEGATIVE 12/29/2016 1011   BILIRUBINUR Negative 06/20/2012 1508   KETONESUR NEGATIVE 12/29/2016 1011   PROTEINUR 100 (A) 12/29/2016 1011   NITRITE NEGATIVE 12/29/2016 1011   LEUKOCYTESUR TRACE (A) 12/29/2016 1011   LEUKOCYTESUR Negative 06/20/2012 1508     RADIOLOGY: Ct Abdomen Pelvis W Contrast  Result Date: 12/29/2016 CLINICAL DATA:  Nausea and vomiting with chronic diarrhea EXAM: CT ABDOMEN AND PELVIS WITH CONTRAST TECHNIQUE: Multidetector CT imaging of the abdomen and pelvis was performed using the standard protocol following bolus administration of intravenous contrast. CONTRAST:  125m ISOVUE-300 IOPAMIDOL (ISOVUE-300) INJECTION 61% COMPARISON:  August 12, 2010 FINDINGS: Lower chest: There is mild bibasilar lung scarring. There is a focal hiatal hernia. Hepatobiliary: Liver measures 24.0 cm in length. No focal liver lesions are evident. Gallbladder is absent. There is mild intrahepatic biliary duct  dilatation. Common bile duct measures 11 mm, prominent. No biliary duct mass or calculus appreciable. Pancreas: There is a stable 4 mm focus of fat in the body of the pancreas, possibly a small lipoma, stable. Pancreas otherwise appears unremarkable. No pancreatic inflammation or duct dilatation evident. Spleen: No splenic lesions are evident. Adrenals/Urinary Tract: Adrenals appear normal bilaterally. There is a cyst in the posterior mid right kidney measuring 1 x 1 cm with immediately adjacent posterior right renal cyst measuring 2.6 x 2.1 cm. There is no hydronephrosis on either side. There is no renal or ureteral calculus on either side. Urinary bladder is midline with wall thickness within normal limits. Stomach/Bowel: There are scattered sigmoid diverticula without diverticulitis. Multiple loops of jejunum have thickened and slightly hyperemic walls. There is no transition zone to suggest bowel obstruction. There is no appreciable mesenteric thickening. There is lipomatous infiltration of the ileocecal valve. There is no appreciable free air or portal venous air. Vascular/Lymphatic: There is atherosclerotic calcification in the aorta and iliac arteries without aneurysm. Major mesenteric vessels appear patent. No adenopathy is evident in the abdomen or pelvis. Reproductive: Uterus is absent.  No pelvic mass. Other: Appendix absent. No abscess or ascites evident in the abdomen or pelvis. There is a rather minimal ventral hernia containing only fat. Musculoskeletal: There is degenerative change in the lumbar spine. There are no blastic or  lytic bone lesions. There is no intramuscular or abdominal wall lesion. IMPRESSION: Multiple loops of jejunum show mildly thickened walls with slight hyperemia consistent with a degree of enteritis. No bowel obstruction. No abscess. There are sigmoid diverticula without diverticulitis. Focal hiatal hernia.  Minimal ventral hernia containing only fat. Prominent liver without  focal liver lesion. There is biliary duct dilatation, likely due to post cholecystectomy state. 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 Electronically Signed   By: Lowella Grip III M.D.   On: 12/29/2016 09:30    EKG: Orders placed or performed during the hospital encounter of 12/29/16  . ED EKG  . ED EKG  . EKG 12-Lead  . EKG 12-Lead  . EKG 12-Lead  . EKG 12-Lead    IMPRESSION AND PLAN:  * Enteritis   Likely C. Difficile, as she received antibiotics 1 month ago and she had procedure for colonoscopy   We'll check stool for GI panel and C. difficile.   Currently we will give Cipro and Flagyl IV for enteritis.  * UTI   IV ciprofloxacin, urine culture is sent.  * Nausea and abdominal pain and vomiting.   Supportive care with IV Zofran, clear liquid diet, IV fluids.   She was diagnosed with esophagitis 3 weeks ago with her endoscopy.  * Hypertension   Stable, continue home medicines.   All the records are reviewed and case discussed with ED provider. Management plans discussed with the patient, family and they are in agreement.  CODE STATUS: Full code. Code Status History    This patient does not have a recorded code status. Please follow your organizational policy for patients in this situation.     Patient's husband and daughter were present in the room during my visit.  TOTAL TIME TAKING CARE OF THIS PATIENT: 45 minutes.    Vaughan Basta M.D on 12/29/2016   Between 7am to 6pm - Pager - [phone]  After 6pm go to [email or web address] - password [email or web address] [facility]  Office  [phone]  CC: Primary care physician; Kirk Ruths., MD   Note: This dictation was prepared with Dragon dictation along with smaller phrase technology. Any transcriptional errors that result from this process are unintentional.

## 2016-12-30 DIAGNOSIS — Z882 Allergy status to sulfonamides status: Secondary | ICD-10-CM | POA: Diagnosis not present

## 2016-12-30 DIAGNOSIS — J45909 Unspecified asthma, uncomplicated: Secondary | ICD-10-CM | POA: Diagnosis present

## 2016-12-30 DIAGNOSIS — K529 Noninfective gastroenteritis and colitis, unspecified: Secondary | ICD-10-CM | POA: Diagnosis present

## 2016-12-30 DIAGNOSIS — N390 Urinary tract infection, site not specified: Secondary | ICD-10-CM | POA: Diagnosis present

## 2016-12-30 DIAGNOSIS — R197 Diarrhea, unspecified: Secondary | ICD-10-CM | POA: Diagnosis present

## 2016-12-30 DIAGNOSIS — I10 Essential (primary) hypertension: Secondary | ICD-10-CM | POA: Diagnosis present

## 2016-12-30 DIAGNOSIS — E119 Type 2 diabetes mellitus without complications: Secondary | ICD-10-CM | POA: Diagnosis present

## 2016-12-30 DIAGNOSIS — E876 Hypokalemia: Secondary | ICD-10-CM | POA: Diagnosis present

## 2016-12-30 DIAGNOSIS — M810 Age-related osteoporosis without current pathological fracture: Secondary | ICD-10-CM | POA: Diagnosis present

## 2016-12-30 DIAGNOSIS — E785 Hyperlipidemia, unspecified: Secondary | ICD-10-CM | POA: Diagnosis present

## 2016-12-30 DIAGNOSIS — Z91012 Allergy to eggs: Secondary | ICD-10-CM | POA: Diagnosis not present

## 2016-12-30 DIAGNOSIS — Z7984 Long term (current) use of oral hypoglycemic drugs: Secondary | ICD-10-CM | POA: Diagnosis not present

## 2016-12-30 DIAGNOSIS — E669 Obesity, unspecified: Secondary | ICD-10-CM | POA: Diagnosis present

## 2016-12-30 DIAGNOSIS — Z6836 Body mass index (BMI) 36.0-36.9, adult: Secondary | ICD-10-CM | POA: Diagnosis not present

## 2016-12-30 DIAGNOSIS — Z7951 Long term (current) use of inhaled steroids: Secondary | ICD-10-CM | POA: Diagnosis not present

## 2016-12-30 DIAGNOSIS — Z888 Allergy status to other drugs, medicaments and biological substances status: Secondary | ICD-10-CM | POA: Diagnosis not present

## 2016-12-30 DIAGNOSIS — G4733 Obstructive sleep apnea (adult) (pediatric): Secondary | ICD-10-CM | POA: Diagnosis present

## 2016-12-30 LAB — POTASSIUM
POTASSIUM: 3.2 mmol/L — AB (ref 3.5–5.1)
Potassium: 2.8 mmol/L — ABNORMAL LOW (ref 3.5–5.1)

## 2016-12-30 LAB — CBC
HEMATOCRIT: 36.7 % (ref 35.0–47.0)
HEMOGLOBIN: 12.6 g/dL (ref 12.0–16.0)
MCH: 29 pg (ref 26.0–34.0)
MCHC: 34.3 g/dL (ref 32.0–36.0)
MCV: 84.6 fL (ref 80.0–100.0)
Platelets: 141 10*3/uL — ABNORMAL LOW (ref 150–440)
RBC: 4.34 MIL/uL (ref 3.80–5.20)
RDW: 13.8 % (ref 11.5–14.5)
WBC: 8.2 10*3/uL (ref 3.6–11.0)

## 2016-12-30 LAB — BASIC METABOLIC PANEL
ANION GAP: 10 (ref 5–15)
BUN: 15 mg/dL (ref 6–20)
CALCIUM: 8.3 mg/dL — AB (ref 8.9–10.3)
CO2: 24 mmol/L (ref 22–32)
Chloride: 107 mmol/L (ref 101–111)
Creatinine, Ser: 0.57 mg/dL (ref 0.44–1.00)
GFR calc Af Amer: >60 mL/min (ref >60)
GFR calc non Af Amer: >60 mL/min (ref >60)
Glucose, Bld: 116 mg/dL — ABNORMAL HIGH (ref 65–99)
Potassium: 2.3 mmol/L — CL (ref 3.5–5.1)
SODIUM: 141 mmol/L (ref 135–145)

## 2016-12-30 LAB — GLUCOSE, CAPILLARY
GLUCOSE-CAPILLARY: 120 mg/dL — AB (ref 65–99)
GLUCOSE-CAPILLARY: 146 mg/dL — AB (ref 65–99)
Glucose-Capillary: 114 mg/dL — ABNORMAL HIGH (ref 65–99)
Glucose-Capillary: 143 mg/dL — ABNORMAL HIGH (ref 65–99)

## 2016-12-30 LAB — URINE CULTURE

## 2016-12-30 LAB — MAGNESIUM
MAGNESIUM: 1.9 mg/dL (ref 1.7–2.4)
Magnesium: 0.9 mg/dL — CL (ref 1.7–2.4)
Magnesium: 1.4 mg/dL — ABNORMAL LOW (ref 1.7–2.4)

## 2016-12-30 LAB — PHOSPHORUS: Phosphorus: 2.3 mg/dL — ABNORMAL LOW (ref 2.5–4.6)

## 2016-12-30 MED ORDER — SODIUM CHLORIDE 0.9 % IV SOLN
30.0000 meq | Freq: Once | INTRAVENOUS | Status: AC
  Administered 2016-12-30: 30 meq via INTRAVENOUS
  Filled 2016-12-30: qty 15

## 2016-12-30 MED ORDER — POTASSIUM CHLORIDE CRYS ER 20 MEQ PO TBCR
40.0000 meq | EXTENDED_RELEASE_TABLET | Freq: Once | ORAL | Status: AC
  Administered 2016-12-30: 40 meq via ORAL
  Filled 2016-12-30: qty 2

## 2016-12-30 MED ORDER — POTASSIUM CHLORIDE 20 MEQ PO PACK
40.0000 meq | PACK | Freq: Once | ORAL | Status: DC
  Filled 2016-12-30: qty 2

## 2016-12-30 MED ORDER — POTASSIUM PHOSPHATES 15 MMOLE/5ML IV SOLN
10.0000 mmol | Freq: Once | INTRAVENOUS | Status: AC
  Administered 2016-12-30: 10 mmol via INTRAVENOUS
  Filled 2016-12-30: qty 3.33

## 2016-12-30 MED ORDER — MAGNESIUM SULFATE 2 GM/50ML IV SOLN
2.0000 g | Freq: Once | INTRAVENOUS | Status: AC
  Administered 2016-12-30: 2 g via INTRAVENOUS
  Filled 2016-12-30 (×2): qty 50

## 2016-12-30 MED ORDER — POTASSIUM CHLORIDE IN NACL 20-0.9 MEQ/L-% IV SOLN
INTRAVENOUS | Status: DC
  Administered 2016-12-30: 10:00:00 via INTRAVENOUS
  Filled 2016-12-30 (×4): qty 1000

## 2016-12-30 MED ORDER — INSULIN ASPART 100 UNIT/ML ~~LOC~~ SOLN
0.0000 [IU] | Freq: Three times a day (TID) | SUBCUTANEOUS | Status: DC
Start: 2016-12-30 — End: 2016-12-31
  Administered 2016-12-30 – 2016-12-31 (×2): 1 [IU] via SUBCUTANEOUS
  Administered 2016-12-31: 2 [IU] via SUBCUTANEOUS
  Filled 2016-12-30: qty 1
  Filled 2016-12-30: qty 2
  Filled 2016-12-30: qty 1

## 2016-12-30 MED ORDER — RISAQUAD PO CAPS
2.0000 | ORAL_CAPSULE | Freq: Every day | ORAL | Status: DC
  Administered 2016-12-30 – 2016-12-31 (×2): 2 via ORAL
  Filled 2016-12-30 (×2): qty 2

## 2016-12-30 MED ORDER — MAGNESIUM SULFATE 2 GM/50ML IV SOLN
2.0000 g | Freq: Once | INTRAVENOUS | Status: AC
  Administered 2016-12-30: 2 g via INTRAVENOUS
  Filled 2016-12-30: qty 50

## 2016-12-30 NOTE — Progress Notes (Signed)
PHARMACY - Electrolyte Replacement Consult  Pharmacy Consult to replace electrolytes in this 76 year old female admitted with diarrhea/colitis   Allergies  Allergen Reactions  . Ace Inhibitors     Other reaction(s): Unknown  . Eggs Or Egg-Derived Products Diarrhea  . Other     Other reaction(s): Other (See Comments) Eggs  . Prednisone     Other reaction(s): Other (See Comments) joint pain  . Risedronate     Other reaction(s): Other (See Comments)  . Sulfa Antibiotics     Other reaction(s): Other (See Comments)  . Sulfasalazine Other (See Comments)    Labs:  Recent Labs  12/29/16 0747 12/30/16 0255 12/30/16 0932  WBC 11.8* 8.2  --   HGB 13.8 12.6  --   HCT 40.6 36.7  --   PLT 185 141*  --   CREATININE 0.66 0.57  --   MG  --  0.9* 1.4*  PHOS  --   --  2.3*  ALBUMIN 3.9  --   --   PROT 6.9  --   --   AST 30  --   --   ALT 26  --   --   ALKPHOS 55  --   --   BILITOT 0.8  --   --    Estimated Creatinine Clearance: 62.8 mL/min (by C-G formula based on SCr of 0.57 mg/dL).   BMP Latest Ref Rng & Units 12/30/2016 12/30/2016 12/29/2016  Glucose 65 - 99 mg/dL - 116(H) 172(H)  BUN 6 - 20 mg/dL - 15 23(H)  Creatinine 0.44 - 1.00 mg/dL - 0.57 0.66  BUN/Creat Ratio 11 - 26 - - -  Sodium 135 - 145 mmol/L - 141 139  Potassium 3.5 - 5.1 mmol/L 2.8(L) 2.3(LL) 3.5  Chloride 101 - 111 mmol/L - 107 108  CO2 22 - 32 mmol/L - 24 20(L)  Calcium 8.9 - 10.3 mg/dL - 8.3(L) 9.2   Assessment: Patient with severe diarrhea and electrolyte abnormalities.  K: 2.8, Mg 1.4, Phos 2.3  Plan:  KCl 57mq IV x 1, Kphos 162ml x 1, Mg 2gm IV x 1. Will recheck potassium this evening, follow up BMP with AM labs.   Lenoria Narine C 12/30/2016,1:06 PM

## 2016-12-30 NOTE — Progress Notes (Signed)
PHARMACY - Electrolyte Replacement Consult  Pharmacy Consult to replace electrolytes in this 76 year old female admitted with diarrhea/colitis   Allergies  Allergen Reactions  . Ace Inhibitors     Other reaction(s): Unknown  . Eggs Or Egg-Derived Products Diarrhea  . Other     Other reaction(s): Other (See Comments) Eggs  . Prednisone     Other reaction(s): Other (See Comments) joint pain  . Risedronate     Other reaction(s): Other (See Comments)  . Sulfa Antibiotics     Other reaction(s): Other (See Comments)  . Sulfasalazine Other (See Comments)    Labs:  Recent Labs  12/29/16 0747 12/30/16 0255 12/30/16 0932 12/30/16 1745  WBC 11.8* 8.2  --   --   HGB 13.8 12.6  --   --   HCT 40.6 36.7  --   --   PLT 185 141*  --   --   CREATININE 0.66 0.57  --   --   MG  --  0.9* 1.4* 1.9  PHOS  --   --  2.3*  --   ALBUMIN 3.9  --   --   --   PROT 6.9  --   --   --   AST 30  --   --   --   ALT 26  --   --   --   ALKPHOS 55  --   --   --   BILITOT 0.8  --   --   --    Estimated Creatinine Clearance: 62.8 mL/min (by C-G formula based on SCr of 0.57 mg/dL).   BMP Latest Ref Rng & Units 12/30/2016 12/30/2016 12/30/2016  Glucose 65 - 99 mg/dL - - 116(H)  BUN 6 - 20 mg/dL - - 15  Creatinine 0.44 - 1.00 mg/dL - - 0.57  BUN/Creat Ratio 11 - 26 - - -  Sodium 135 - 145 mmol/L - - 141  Potassium 3.5 - 5.1 mmol/L 3.2(L) 2.8(L) 2.3(LL)  Chloride 101 - 111 mmol/L - - 107  CO2 22 - 32 mmol/L - - 24  Calcium 8.9 - 10.3 mg/dL - - 8.3(L)   Assessment: Patient with severe diarrhea and electrolyte abnormalities.   Plan:  03/31 1745 K+: 3.2    Will order KCl 17mq PO x 1. Follow up BMP with AM labs.   SPernell Dupre PharmD, BCPS Clinical Pharmacist 12/30/2016 6:55 PM

## 2016-12-30 NOTE — Care Management Obs Status (Signed)
[place] NOTIFICATION   Patient Details  Name: [name] MRN: [id number] Date of Birth: [birth year]   Medicare Observation Status Notification Given:  Yes (MOON given)    Deunta Beneke A, RN 12/30/2016, 2:52 PM

## 2016-12-30 NOTE — Progress Notes (Signed)
[facility] at [place] NAME: [name]    MR#:  [id number]  DATE OF BIRTH:  10/15/1940  SUBJECTIVE:   Patient still with diffuse watery diarrhea  REVIEW OF SYSTEMS:    Review of Systems  Constitutional: Negative for chills, fever and malaise/fatigue.  HENT: Negative.  Negative for ear discharge, ear pain, hearing loss, nosebleeds and sore throat.   Eyes: Negative.  Negative for blurred vision and pain.  Respiratory: Negative.  Negative for cough, hemoptysis, shortness of breath and wheezing.   Cardiovascular: Negative.  Negative for chest pain, palpitations and leg swelling.  Gastrointestinal: Positive for diarrhea. Negative for abdominal pain, blood in stool, nausea and vomiting.  Genitourinary: Negative.  Negative for dysuria.  Musculoskeletal: Negative.  Negative for back pain.  Skin: Negative.   Neurological: Positive for weakness. Negative for dizziness, tremors, speech change, focal weakness, seizures and headaches.  Endo/Heme/Allergies: Negative.  Does not bruise/bleed easily.  Psychiatric/Behavioral: Negative.  Negative for depression, hallucinations and suicidal ideas.    Tolerating Diet: yes      DRUG ALLERGIES:   Allergies  Allergen Reactions  . Ace Inhibitors     Other reaction(s): Unknown  . Eggs Or Egg-Derived Products Diarrhea  . Other     Other reaction(s): Other (See Comments) Eggs  . Prednisone     Other reaction(s): Other (See Comments) joint pain  . Risedronate     Other reaction(s): Other (See Comments)  . Sulfa Antibiotics     Other reaction(s): Other (See Comments)  . Sulfasalazine Other (See Comments)    VITALS:  Blood pressure (!) 146/47, pulse 75, temperature 98.7 F (37.1 C), temperature source Oral, resp. rate 19, height 5' 2"  (1.575 m), weight 91.2 kg (201 lb 1.6 oz), SpO2 95 %.  PHYSICAL EXAMINATION:   Physical Exam  Constitutional: She is oriented to person, place, and time and  well-developed, well-nourished, and in no distress. No distress.  HENT:  Head: Normocephalic.  Eyes: No scleral icterus.  Neck: Normal range of motion. Neck supple. No JVD present. No tracheal deviation present.  Cardiovascular: Normal rate, regular rhythm and normal heart sounds.  Exam reveals no gallop and no friction rub.   No murmur heard. Pulmonary/Chest: Effort normal and breath sounds normal. No respiratory distress. She has no wheezes. She has no rales. She exhibits no tenderness.  Abdominal: Soft. Bowel sounds are normal. She exhibits no distension and no mass. There is no tenderness. There is no rebound and no guarding.  Musculoskeletal: Normal range of motion. She exhibits no edema.  Neurological: She is alert and oriented to person, place, and time.  Skin: Skin is warm. No rash noted. No erythema.  Psychiatric: Affect and judgment normal.      LABORATORY PANEL:   CBC  Recent Labs Lab 12/30/16 0255  WBC 8.2  HGB 12.6  HCT 36.7  PLT 141*   ------------------------------------------------------------------------------------------------------------------  Chemistries   Recent Labs Lab 12/29/16 0747 12/30/16 0255  NA 139 141  K 3.5 2.3*  CL 108 107  CO2 20* 24  GLUCOSE 172* 116*  BUN 23* 15  CREATININE 0.66 0.57  CALCIUM 9.2 8.3*  MG  --  0.9*  AST 30  --   ALT 26  --   ALKPHOS 55  --   BILITOT 0.8  --    ------------------------------------------------------------------------------------------------------------------  Cardiac Enzymes  Recent Labs Lab 12/29/16 0747  TROPONINI <0.03   ------------------------------------------------------------------------------------------------------------------  RADIOLOGY:  Ct Abdomen Pelvis W Contrast  Result Date: 12/29/2016 CLINICAL DATA:  Nausea and vomiting with chronic diarrhea EXAM: CT ABDOMEN AND PELVIS WITH CONTRAST TECHNIQUE: Multidetector CT imaging of the abdomen and pelvis was performed using the  standard protocol following bolus administration of intravenous contrast. CONTRAST:  187m ISOVUE-300 IOPAMIDOL (ISOVUE-300) INJECTION 61% COMPARISON:  August 12, 2010 FINDINGS: Lower chest: There is mild bibasilar lung scarring. There is a focal hiatal hernia. Hepatobiliary: Liver measures 24.0 cm in length. No focal liver lesions are evident. Gallbladder is absent. There is mild intrahepatic biliary duct dilatation. Common bile duct measures 11 mm, prominent. No biliary duct mass or calculus appreciable. Pancreas: There is a stable 4 mm focus of fat in the body of the pancreas, possibly a small lipoma, stable. Pancreas otherwise appears unremarkable. No pancreatic inflammation or duct dilatation evident. Spleen: No splenic lesions are evident. Adrenals/Urinary Tract: Adrenals appear normal bilaterally. There is a cyst in the posterior mid right kidney measuring 1 x 1 cm with immediately adjacent posterior right renal cyst measuring 2.6 x 2.1 cm. There is no hydronephrosis on either side. There is no renal or ureteral calculus on either side. Urinary bladder is midline with wall thickness within normal limits. Stomach/Bowel: There are scattered sigmoid diverticula without diverticulitis. Multiple loops of jejunum have thickened and slightly hyperemic walls. There is no transition zone to suggest bowel obstruction. There is no appreciable mesenteric thickening. There is lipomatous infiltration of the ileocecal valve. There is no appreciable free air or portal venous air. Vascular/Lymphatic: There is atherosclerotic calcification in the aorta and iliac arteries without aneurysm. Major mesenteric vessels appear patent. No adenopathy is evident in the abdomen or pelvis. Reproductive: Uterus is absent.  No pelvic mass. Other: Appendix absent. No abscess or ascites evident in the abdomen or pelvis. There is a rather minimal ventral hernia containing only fat. Musculoskeletal: There is degenerative change in the lumbar  spine. There are no blastic or lytic bone lesions. There is no intramuscular or abdominal wall lesion. IMPRESSION: Multiple loops of jejunum show mildly thickened walls with slight hyperemia consistent with a degree of enteritis. No bowel obstruction. No abscess. There are sigmoid diverticula without diverticulitis. Focal hiatal hernia.  Minimal ventral hernia containing only fat. Prominent liver without focal liver lesion. There is biliary duct dilatation, likely due to post cholecystectomy state. 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 Electronically Signed   By: WLowella GripIII M.D.   On: 12/29/2016 09:30     ASSESSMENT AND PLAN:   76year old female with a history of diabetes and hyperlipidemia who presents with diarrhea and found to have enteritis on CT scan.   1. Enteritis: GI panel and C. difficile panel negative Continue ciprofloxacin and Flagyl May transition to oral tomorrow Add probiotic 2. Urinary tract infection: Continue ciprofloxacin and follow up on urine culture  3. Diabetes: Add sliding scale  4. Severe hypokalemia and hypomagnesemia: These are being repleted Continue IV fluids with potassium supplementation  5. Essential hypertension: Continue diltiazem Management plans discussed with the patient and she is in agreement.  CODE STATUS: full  TOTAL TIME TAKING CARE OF THIS PATIENT: 30 minutes.     POSSIBLE D/C 2 days, DEPENDING ON CLINICAL CONDITION.   Charlane Westry M.D on 12/30/2016 at 9:24 AM  Between 7am to 6pm - Pager - [phone] After 6pm go to [email or web address] - password [email or web address]  Office  [phone] CC: Primary care physician; AKirk Ruths, MD  Note: This dictation  was prepared with Dragon dictation along with smaller phrase technology. Any transcriptional errors that result from this process are unintentional.

## 2016-12-31 LAB — GLUCOSE, CAPILLARY
GLUCOSE-CAPILLARY: 138 mg/dL — AB (ref 65–99)
GLUCOSE-CAPILLARY: 167 mg/dL — AB (ref 65–99)

## 2016-12-31 LAB — BASIC METABOLIC PANEL
Anion gap: 6 (ref 5–15)
BUN: 11 mg/dL (ref 6–20)
CHLORIDE: 113 mmol/L — AB (ref 101–111)
CO2: 22 mmol/L (ref 22–32)
CREATININE: 0.64 mg/dL (ref 0.44–1.00)
Calcium: 8.2 mg/dL — ABNORMAL LOW (ref 8.9–10.3)
GFR calc non Af Amer: >60 mL/min (ref >60)
GLUCOSE: 129 mg/dL — AB (ref 65–99)
Potassium: 3.4 mmol/L — ABNORMAL LOW (ref 3.5–5.1)
Sodium: 141 mmol/L (ref 135–145)

## 2016-12-31 LAB — PHOSPHORUS: PHOSPHORUS: 2.1 mg/dL — AB (ref 2.5–4.6)

## 2016-12-31 LAB — MAGNESIUM: Magnesium: 1.7 mg/dL (ref 1.7–2.4)

## 2016-12-31 MED ORDER — METRONIDAZOLE 500 MG PO TABS: 500.0000 mg | ORAL_TABLET | Freq: Three times a day (TID) | ORAL | 0 refills | Status: DC

## 2016-12-31 MED ORDER — MAGNESIUM SULFATE 2 GM/50ML IV SOLN
2.0000 g | Freq: Once | INTRAVENOUS | Status: AC
  Administered 2016-12-31: 2 g via INTRAVENOUS
  Filled 2016-12-31: qty 50

## 2016-12-31 MED ORDER — CIPROFLOXACIN HCL 500 MG PO TABS: 500.0000 mg | ORAL_TABLET | Freq: Two times a day (BID) | ORAL | 0 refills | Status: AC

## 2016-12-31 MED ORDER — RISAQUAD PO CAPS
2.0000 | ORAL_CAPSULE | Freq: Every day | ORAL | 0 refills | Status: DC
Start: 2016-12-31 — End: 2016-12-31

## 2016-12-31 MED ORDER — POTASSIUM CHLORIDE 20 MEQ PO PACK
20.0000 meq | PACK | Freq: Once | ORAL | Status: DC
  Filled 2016-12-31: qty 1

## 2016-12-31 MED ORDER — K PHOS MONO-SOD PHOS DI & MONO 155-852-130 MG PO TABS
500.0000 mg | ORAL_TABLET | Freq: Three times a day (TID) | ORAL | Status: DC
  Administered 2016-12-31: 500 mg via ORAL
  Filled 2016-12-31: qty 2

## 2016-12-31 MED ORDER — CIPROFLOXACIN HCL 500 MG PO TABS: 500.0000 mg | ORAL_TABLET | Freq: Two times a day (BID) | ORAL | 0 refills | Status: DC

## 2016-12-31 MED ORDER — RISAQUAD PO CAPS: 2.0000 | ORAL_CAPSULE | Freq: Every day | ORAL | 0 refills | Status: AC

## 2016-12-31 MED ORDER — METRONIDAZOLE 500 MG PO TABS: 500.0000 mg | ORAL_TABLET | Freq: Three times a day (TID) | ORAL | 0 refills | Status: AC

## 2016-12-31 MED ORDER — POTASSIUM CHLORIDE CRYS ER 20 MEQ PO TBCR
20.0000 meq | EXTENDED_RELEASE_TABLET | Freq: Once | ORAL | Status: AC
  Administered 2016-12-31: 20 meq via ORAL
  Filled 2016-12-31: qty 1

## 2016-12-31 MED ORDER — POTASSIUM CHLORIDE 20 MEQ PO PACK: 20.0000 meq | PACK | Freq: Once | ORAL | Status: DC

## 2016-12-31 NOTE — Discharge Summary (Signed)
[facility] at [place] NAME: [name]    MR#:  [id number]  DATE OF BIRTH:  10-14-1940  DATE OF ADMISSION:  12/29/2016 ADMITTING PHYSICIAN: Theodoro Grist, MD  DATE OF DISCHARGE: 12/31/2016  PRIMARY CARE PHYSICIAN: Kirk Ruths., MD    ADMISSION DIAGNOSIS:  Urinary tract infection without hematuria, site unspecified [N39.0] Diarrhea, unspecified type [R19.7] Intractable vomiting without nausea, unspecified vomiting type [R11.11]  DISCHARGE DIAGNOSIS:  Principal Problem:   Diarrhea Active Problems:   Enteritis   Acute lower UTI   SECONDARY DIAGNOSIS:   Past Medical History:  Diagnosis Date  . Albuminuria   . Arthritis   . Asthma   . Cataract cortical, senile   . Diabetes mellitus without complication ([place])   . Hemorrhoids   . Hyperlipidemia   . Hypertension   . Lyme disease   . 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 OSA (obstructive sleep apnea)   . Osteoporosis   . Steatohepatitis   . Steatohepatitis     HOSPITAL COURSE:   76 year old female with a history of diabetes and hyperlipidemia who presents with diarrhea and found to have enteritis on CT scan.   1. Enteritis: GI panel and C. difficile panel are negative She will continue ciprofloxacin and Flagyl for a total of 10 days of therapy along with probiotic. She can follow up with GI in 2 weeks   2. Urinary tract infection: Continue ciprofloxacin and follow up on urine culture shows MULTIPLE SPECIES PRESENT.  3. Diabetes: Resume home medications with ADA diet  4. Severe hypokalemia and hypomagnesemia: These were repleted and improved.  5. Essential hypertension: Continue diltiazem  DISCHARGE CONDITIONS AND DIET:   Stable Diabetic heart healthy diet  CONSULTS OBTAINED:    DRUG ALLERGIES:   Allergies  Allergen Reactions  . Ace Inhibitors     Other reaction(s): Unknown  . Eggs Or Egg-Derived Products Diarrhea  . Other     Other reaction(s): Other (See  Comments) Eggs  . Prednisone     Other reaction(s): Other (See Comments) joint pain  . Risedronate     Other reaction(s): Other (See Comments)  . Sulfa Antibiotics     Other reaction(s): Other (See Comments)  . Sulfasalazine Other (See Comments)    DISCHARGE MEDICATIONS:   Current Discharge Medication List    START taking these medications   Details  acidophilus (RISAQUAD) CAPS capsule Take 2 capsules by mouth daily. Qty: 48 capsule, Refills: 0    ciprofloxacin (CIPRO) 500 MG tablet Take 1 tablet (500 mg total) by mouth 2 (two) times daily. Qty: 14 tablet, Refills: 0    metroNIDAZOLE (FLAGYL) 500 MG tablet Take 1 tablet (500 mg total) by mouth 3 (three) times daily. Qty: 21 tablet, Refills: 0      CONTINUE these medications which have NOT CHANGED   Details  calcium carbonate (OS-CAL - DOSED IN MG OF ELEMENTAL CALCIUM) 1250 (500 Ca) MG tablet Take 1 tablet by mouth.    diltiazem (TIAZAC) 360 MG 24 hr capsule TAKE 1 CAPSULE EVERY DAY Qty: 30 capsule, Refills: 3    Fluticasone-Salmeterol (ADVAIR) 250-50 MCG/DOSE AEPB Inhale 1 puff into the lungs 2 (two) times daily.    losartan-hydrochlorothiazide (HYZAAR) 100-25 MG per tablet Take 1 tablet by mouth daily.    metFORMIN (GLUCOPHAGE) 500 MG tablet Take 500 mg by mouth 2 (two) times daily with a meal.     montelukast (SINGULAIR) 10 MG tablet Take 10 mg by mouth at bedtime.  pantoprazole (PROTONIX) 40 MG tablet Take 40 mg by mouth daily.    potassium chloride SA (K-DUR,KLOR-CON) 20 MEQ tablet Take 20 mEq by mouth 2 (two) times daily.    rosuvastatin (CRESTOR) 40 MG tablet Take 40 mg by mouth daily.    sertraline (ZOLOFT) 100 MG tablet Take 100 mg by mouth daily.    albuterol (PROVENTIL HFA;VENTOLIN HFA) 108 (90 BASE) MCG/ACT inhaler Inhale 1-2 puffs into the lungs every 4 (four) hours as needed for wheezing or shortness of breath.    cyclobenzaprine (FLEXERIL) 10 MG tablet Take 10 mg by mouth.          Today    CHIEF COMPLAINT:  I slept very well No BM since last night around 1800.   VITAL SIGNS:  Blood pressure 140/64, pulse 74, temperature 98.2 F (36.8 C), temperature source Oral, resp. rate 18, height 5' 2"  (1.575 m), weight 91.2 kg (201 lb 1.6 oz), SpO2 90 %.   REVIEW OF SYSTEMS:  Review of Systems  Constitutional: Negative.  Negative for chills, fever and malaise/fatigue.  HENT: Negative.  Negative for ear discharge, ear pain, hearing loss, nosebleeds and sore throat.   Eyes: Negative.  Negative for blurred vision and pain.  Respiratory: Negative.  Negative for cough, hemoptysis, shortness of breath and wheezing.   Cardiovascular: Negative.  Negative for chest pain, palpitations and leg swelling.  Gastrointestinal: Negative.  Negative for abdominal pain, blood in stool, diarrhea, nausea and vomiting.  Genitourinary: Negative.  Negative for dysuria.  Musculoskeletal: Negative.  Negative for back pain.  Skin: Negative.   Neurological: Negative for dizziness, tremors, speech change, focal weakness, seizures and headaches.  Endo/Heme/Allergies: Negative.  Does not bruise/bleed easily.  Psychiatric/Behavioral: Negative.  Negative for depression, hallucinations and suicidal ideas.     PHYSICAL EXAMINATION:  GENERAL:  76 y.o.-year-old patient lying in the bed with no acute distress.  NECK:  Supple, no jugular venous distention. No thyroid enlargement, no tenderness.  LUNGS: Normal breath sounds bilaterally, no wheezing, rales,rhonchi  No use of accessory muscles of respiration.  CARDIOVASCULAR: S1, S2 normal. No murmurs, rubs, or gallops.  ABDOMEN: Soft, non-tender, non-distended. Bowel sounds present. No organomegaly or mass.  EXTREMITIES: No pedal edema, cyanosis, or clubbing.  PSYCHIATRIC: The patient is alert and oriented x 3.  SKIN: No obvious rash, lesion, or ulcer.   DATA REVIEW:   CBC  Recent Labs Lab 12/30/16 0255  WBC 8.2  HGB 12.6  HCT 36.7  PLT 141*     Chemistries   Recent Labs Lab 12/29/16 0747  12/31/16 0318  NA 139  < > 141  K 3.5  < > 3.4*  CL 108  < > 113*  CO2 20*  < > 22  GLUCOSE 172*  < > 129*  BUN 23*  < > 11  CREATININE 0.66  < > 0.64  CALCIUM 9.2  < > 8.2*  MG  --   < > 1.7  AST 30  --   --   ALT 26  --   --   ALKPHOS 55  --   --   BILITOT 0.8  --   --   < > = values in this interval not displayed.  Cardiac Enzymes  Recent Labs Lab 12/29/16 0747  TROPONINI <0.03    Microbiology Results  @[email or web address] @  RADIOLOGY:  Ct Abdomen Pelvis W Contrast  Result Date: 12/29/2016 CLINICAL DATA:  Nausea and vomiting with chronic diarrhea EXAM: CT ABDOMEN AND PELVIS WITH CONTRAST TECHNIQUE: Multidetector  CT imaging of the abdomen and pelvis was performed using the standard protocol following bolus administration of intravenous contrast. CONTRAST:  13m ISOVUE-300 IOPAMIDOL (ISOVUE-300) INJECTION 61% COMPARISON:  August 12, 2010 FINDINGS: Lower chest: There is mild bibasilar lung scarring. There is a focal hiatal hernia. Hepatobiliary: Liver measures 24.0 cm in length. No focal liver lesions are evident. Gallbladder is absent. There is mild intrahepatic biliary duct dilatation. Common bile duct measures 11 mm, prominent. No biliary duct mass or calculus appreciable. Pancreas: There is a stable 4 mm focus of fat in the body of the pancreas, possibly a small lipoma, stable. Pancreas otherwise appears unremarkable. No pancreatic inflammation or duct dilatation evident. Spleen: No splenic lesions are evident. Adrenals/Urinary Tract: Adrenals appear normal bilaterally. There is a cyst in the posterior mid right kidney measuring 1 x 1 cm with immediately adjacent posterior right renal cyst measuring 2.6 x 2.1 cm. There is no hydronephrosis on either side. There is no renal or ureteral calculus on either side. Urinary bladder is midline with wall thickness within normal limits. Stomach/Bowel: There are scattered sigmoid diverticula  without diverticulitis. Multiple loops of jejunum have thickened and slightly hyperemic walls. There is no transition zone to suggest bowel obstruction. There is no appreciable mesenteric thickening. There is lipomatous infiltration of the ileocecal valve. There is no appreciable free air or portal venous air. Vascular/Lymphatic: There is atherosclerotic calcification in the aorta and iliac arteries without aneurysm. Major mesenteric vessels appear patent. No adenopathy is evident in the abdomen or pelvis. Reproductive: Uterus is absent.  No pelvic mass. Other: Appendix absent. No abscess or ascites evident in the abdomen or pelvis. There is a rather minimal ventral hernia containing only fat. Musculoskeletal: There is degenerative change in the lumbar spine. There are no blastic or lytic bone lesions. There is no intramuscular or abdominal wall lesion. IMPRESSION: Multiple loops of jejunum show mildly thickened walls with slight hyperemia consistent with a degree of enteritis. No bowel obstruction. No abscess. There are sigmoid diverticula without diverticulitis. Focal hiatal hernia.  Minimal ventral hernia containing only fat. Prominent liver without focal liver lesion. There is biliary duct dilatation, likely due to post cholecystectomy state. 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 Electronically Signed   By: WLowella GripIII M.D.   On: 12/29/2016 09:30      Current Discharge Medication List    START taking these medications   Details  acidophilus (RISAQUAD) CAPS capsule Take 2 capsules by mouth daily. Qty: 48 capsule, Refills: 0    ciprofloxacin (CIPRO) 500 MG tablet Take 1 tablet (500 mg total) by mouth 2 (two) times daily. Qty: 14 tablet, Refills: 0    metroNIDAZOLE (FLAGYL) 500 MG tablet Take 1 tablet (500 mg total) by mouth 3 (three) times daily. Qty: 21 tablet, Refills: 0      CONTINUE these medications which have NOT CHANGED   Details   calcium carbonate (OS-CAL - DOSED IN MG OF ELEMENTAL CALCIUM) 1250 (500 Ca) MG tablet Take 1 tablet by mouth.    diltiazem (TIAZAC) 360 MG 24 hr capsule TAKE 1 CAPSULE EVERY DAY Qty: 30 capsule, Refills: 3    Fluticasone-Salmeterol (ADVAIR) 250-50 MCG/DOSE AEPB Inhale 1 puff into the lungs 2 (two) times daily.    losartan-hydrochlorothiazide (HYZAAR) 100-25 MG per tablet Take 1 tablet by mouth daily.    metFORMIN (GLUCOPHAGE) 500 MG tablet Take 500 mg by mouth 2 (two) times daily with a meal.  montelukast (SINGULAIR) 10 MG tablet Take 10 mg by mouth at bedtime.    pantoprazole (PROTONIX) 40 MG tablet Take 40 mg by mouth daily.    potassium chloride SA (K-DUR,KLOR-CON) 20 MEQ tablet Take 20 mEq by mouth 2 (two) times daily.    rosuvastatin (CRESTOR) 40 MG tablet Take 40 mg by mouth daily.    sertraline (ZOLOFT) 100 MG tablet Take 100 mg by mouth daily.    albuterol (PROVENTIL HFA;VENTOLIN HFA) 108 (90 BASE) MCG/ACT inhaler Inhale 1-2 puffs into the lungs every 4 (four) hours as needed for wheezing or shortness of breath.    cyclobenzaprine (FLEXERIL) 10 MG tablet Take 10 mg by mouth.           Management plans discussed with the patient and she is in agreement. Stable for discharge home  Patient should follow up with pcp  CODE STATUS:     Code Status Orders        Start     Ordered   12/29/16 1656  Full code  Continuous     12/29/16 1655    Code Status History    Date Active Date Inactive Code Status Order ID Comments User Context   This patient has a current code status but no historical code status.      TOTAL TIME TAKING CARE OF THIS PATIENT: 37 minutes.    Note: This dictation was prepared with Dragon dictation along with smaller phrase technology. Any transcriptional errors that result from this process are unintentional.  Jaquarius Seder M.D on 12/31/2016 at 7:15 AM  Between 7am to 6pm - Pager - [phone] After 6pm go to [email or web address] - password [email or web address]  Office  [phone]  CC: Primary care physician; Kirk Ruths., MD

## 2016-12-31 NOTE — Progress Notes (Signed)
PHARMACY - Electrolyte Replacement Consult  Pharmacy Consult to replace electrolytes in this 76 year old female admitted with diarrhea/colitis   Allergies  Allergen Reactions  . Ace Inhibitors     Other reaction(s): Unknown  . Eggs Or Egg-Derived Products Diarrhea  . Other     Other reaction(s): Other (See Comments) Eggs  . Prednisone     Other reaction(s): Other (See Comments) joint pain  . Risedronate     Other reaction(s): Other (See Comments)  . Sulfa Antibiotics     Other reaction(s): Other (See Comments)  . Sulfasalazine Other (See Comments)    Labs:  Recent Labs  12/29/16 0747  12/30/16 0255 12/30/16 0932 12/30/16 1745 12/31/16 0318  WBC 11.8*  --  8.2  --   --   --   HGB 13.8  --  12.6  --   --   --   HCT 40.6  --  36.7  --   --   --   PLT 185  --  141*  --   --   --   CREATININE 0.66  --  0.57  --   --  0.64  MG  --   < > 0.9* 1.4* 1.9 1.7  PHOS  --   --   --  2.3*  --  2.1*  ALBUMIN 3.9  --   --   --   --   --   PROT 6.9  --   --   --   --   --   AST 30  --   --   --   --   --   ALT 26  --   --   --   --   --   ALKPHOS 55  --   --   --   --   --   BILITOT 0.8  --   --   --   --   --   < > = values in this interval not displayed. Estimated Creatinine Clearance: 62.8 mL/min (by C-G formula based on SCr of 0.64 mg/dL).   BMP Latest Ref Rng & Units 12/31/2016 12/30/2016 12/30/2016  Glucose 65 - 99 mg/dL 129(H) - -  BUN 6 - 20 mg/dL 11 - -  Creatinine 0.44 - 1.00 mg/dL 0.64 - -  BUN/Creat Ratio 11 - 26 - - -  Sodium 135 - 145 mmol/L 141 - -  Potassium 3.5 - 5.1 mmol/L 3.4(L) 3.2(L) 2.8(L)  Chloride 101 - 111 mmol/L 113(H) - -  CO2 22 - 32 mmol/L 22 - -  Calcium 8.9 - 10.3 mg/dL 8.2(L) - -   Assessment: Patient with severe diarrhea and electrolyte abnormalities.  Plan:  KCl 20 meq po once, mg sulfate 2 g iv once, and Kphos neutral 2 tabs tid x 3. Will f/u AM labs.   Ulice Dash D 12/31/2016,7:09 AM

## 2016-12-31 NOTE — Progress Notes (Signed)
Patient discharging home. Instructions given to patient, verbalized understanding. Husband to transport home.

## 2017-01-01 LAB — GLUCOSE, CAPILLARY: Glucose-Capillary: 130 mg/dL — ABNORMAL HIGH (ref 65–99)

## 2017-02-13 ENCOUNTER — Ambulatory Visit
Admission: RE | Admit: 2017-02-13 | Discharge: 2017-02-13 | Disposition: A | Discharge status: Home / Self Care | Payer: Medicare Other | Source: Ambulatory Visit | Attending: Physician Assistant | Admitting: Physician Assistant

## 2017-02-13 ENCOUNTER — Ambulatory Visit (HOSPITAL_COMMUNITY): Payer: Medicare Other

## 2017-02-13 ENCOUNTER — Other Ambulatory Visit: Payer: Self-pay | Admitting: Physician Assistant

## 2017-02-13 DIAGNOSIS — M79605 Pain in left leg: Secondary | ICD-10-CM | POA: Insufficient documentation

## 2017-02-13 DIAGNOSIS — M7989 Other specified soft tissue disorders: Secondary | ICD-10-CM | POA: Insufficient documentation

## 2017-04-12 DIAGNOSIS — C4491 Basal cell carcinoma of skin, unspecified: Secondary | ICD-10-CM

## 2017-04-12 HISTORY — DX: Basal cell carcinoma of skin, unspecified: C44.91

## 2017-05-16 DIAGNOSIS — K219 Gastro-esophageal reflux disease without esophagitis: Secondary | ICD-10-CM | POA: Insufficient documentation

## 2017-08-28 ENCOUNTER — Emergency Department
Admission: EM | Admit: 2017-08-28 | Discharge: 2017-08-28 | Disposition: A | Discharge status: Home / Self Care | Payer: Medicare Other | Attending: Emergency Medicine | Admitting: Emergency Medicine

## 2017-08-28 ENCOUNTER — Other Ambulatory Visit: Payer: Self-pay

## 2017-08-28 DIAGNOSIS — J45909 Unspecified asthma, uncomplicated: Secondary | ICD-10-CM | POA: Insufficient documentation

## 2017-08-28 DIAGNOSIS — I10 Essential (primary) hypertension: Secondary | ICD-10-CM | POA: Diagnosis not present

## 2017-08-28 DIAGNOSIS — R51 Headache: Secondary | ICD-10-CM | POA: Diagnosis not present

## 2017-08-28 DIAGNOSIS — Z79899 Other long term (current) drug therapy: Secondary | ICD-10-CM | POA: Diagnosis not present

## 2017-08-28 DIAGNOSIS — Z91012 Allergy to eggs: Secondary | ICD-10-CM | POA: Insufficient documentation

## 2017-08-28 DIAGNOSIS — Z9119 Patient's noncompliance with other medical treatment and regimen: Secondary | ICD-10-CM | POA: Diagnosis not present

## 2017-08-28 MED ORDER — CLONIDINE HCL 0.1 MG PO TABS
0.1000 mg | ORAL_TABLET | Freq: Once | ORAL | Status: DC
  Filled 2017-08-28: qty 1

## 2017-08-28 NOTE — ED Triage Notes (Addendum)
Pt reports high blood pressure x 3 weeks. Recent medication changes by PCP to get blood pressure under control. Pt alert and oriented X4, active, cooperative, pt in NAD. RR even and unlabored, color WNL.  Denies CP or SOB. Dull headache today.  Pt instructed to take lopressor at approx 5PM per PCP. Pt states BP did not come down within 45 minutes, instructed to go to walk in clinic or ER.

## 2017-08-28 NOTE — ED Notes (Signed)

## 2017-08-28 NOTE — ED Notes (Signed)
Pt reports elevated BP for last 3 weeks, states she was seen by PCP who "has been changing my medications, I'm on 4 different ones now." Pt states she has interm headaches, denies vision changes. Pt A&O at this time, states she took lopressor at 5pm.

## 2017-08-28 NOTE — Discharge Instructions (Signed)
Discontinue diltiazem as your doctor instructed.   In the ER, your heart rate was 48 and blood pressure about 115/80 after taking your lopressor.  This low of a heart rate can make you feel dizzy, weak, and off balance.  Decrease your morning dose of lopressor to 30m (half-tablet), and keep the evening dose at 52m   Continue monitoring your blood pressure and heart rate and follow up with your doctor later this week.

## 2017-08-28 NOTE — ED Provider Notes (Signed)
[facility] Emergency Department Provider Note  ____________________________________________  Time seen: Approximately 9:16 PM  I have reviewed the triage vital signs and the nursing notes.   HISTORY  Chief Complaint Hypertension and Headache    HPI [name] is a 76 y.o. female sent to the ED for evaluation due to persistently elevated blood pressure today. Her blood pressure is been uncontrolled for the past 3 weeks. Earlier this month she was started on hydralazine which was ineffective. Yesterday her doctor told her to discontinue her diltiazem and start metoprolol. Her blood pressure remained elevated through this afternoon with those changes in affect only for about 24 hours. She then took her second dose of metoprolol at 5 PM, and 45 minutes later she had not noticed any change in her blood pressure, so she came to the ER. On arrival to the treatment room, heart rate is 48 and blood pressure is about 115/70.  With her blood pressure being very high, as high as 200/80, she notes that she had just a dull generalized headache. No sudden or severe onset. No chest pain shortness of breath syncope abdominal pain or back pain. Now that the blood pressure is down, her headache has resolved. She feels back to normal and just fine. With her heart rate being about 50, she did feel a little bit lightheaded when she was walking to the bathroom but no other symptoms, and resolved when she sat down again.     Past Medical History:  Diagnosis Date  . Albuminuria   . Arthritis   . Asthma   . Cataract cortical, senile   . Diabetes mellitus without complication ([place])   . Hemorrhoids   . Hyperlipidemia   . Hypertension   . Lyme disease   . 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 OSA (obstructive sleep apnea)   . Osteoporosis   . Steatohepatitis   . Steatohepatitis      Patient Active Problem List   Diagnosis Date Noted  . Diarrhea 12/29/2016  . Enteritis 12/29/2016  .  Acute lower UTI 12/29/2016  . Precordial pain 07/24/2014  . Dyspnea 07/24/2014  . Palpitations 07/24/2014  . Essential hypertension 07/24/2014  . Hyperlipidemia 07/24/2014     Past Surgical History:  Procedure Laterality Date  . ABDOMINAL HYSTERECTOMY    . APPENDECTOMY    . [place]  . CARDIAC CATHETERIZATION  08/13/2014   [facility]. no significant CAD, normal LVEDP.   Marland Kitchen CATARACT EXTRACTION    . CHOLECYSTECTOMY    . COLONOSCOPY    . COLONOSCOPY WITH PROPOFOL N/A 12/07/2016   Procedure: COLONOSCOPY WITH PROPOFOL;  Surgeon: Lollie Sails, MD;  Location: [facility] ENDOSCOPY;  Service: Endoscopy;  Laterality: N/A;  . ESOPHAGOGASTRODUODENOSCOPY    . ESOPHAGOGASTRODUODENOSCOPY (EGD) WITH PROPOFOL N/A 12/07/2016   Procedure: ESOPHAGOGASTRODUODENOSCOPY (EGD) WITH PROPOFOL;  Surgeon: Lollie Sails, MD;  Location: [facility] ENDOSCOPY;  Service: Endoscopy;  Laterality: N/A;  . HEMORRHOID SURGERY    . PARTIAL HYSTERECTOMY    . TONSILLECTOMY       Prior to Admission medications   Medication Sig Start Date End Date Taking? Authorizing Provider  albuterol (PROVENTIL HFA;VENTOLIN HFA) 108 (90 BASE) MCG/ACT inhaler Inhale 1-2 puffs into the lungs every 4 (four) hours as needed for wheezing or shortness of breath.    [provider]  calcium carbonate (OS-CAL - DOSED IN MG OF ELEMENTAL CALCIUM) 1250 (500 Ca) MG tablet Take 1 tablet by mouth.  [provider]  cyclobenzaprine (FLEXERIL) 10 MG tablet Take 10 mg by mouth.    [provider]  diltiazem (TIAZAC) 360 MG 24 hr capsule TAKE 1 CAPSULE EVERY DAY 06/28/15   Wellington Hampshire, MD  Fluticasone-Salmeterol (ADVAIR) 250-50 MCG/DOSE AEPB Inhale 1 puff into the lungs 2 (two) times daily.    [provider]  losartan-hydrochlorothiazide (HYZAAR) 100-25 MG per tablet Take 1 tablet by mouth daily.    [provider]  metFORMIN (GLUCOPHAGE) 500 MG tablet Take 500 mg by mouth 2 (two) times  daily with a meal.     [provider]  montelukast (SINGULAIR) 10 MG tablet Take 10 mg by mouth at bedtime.    [provider]  pantoprazole (PROTONIX) 40 MG tablet Take 40 mg by mouth daily.    [provider]  potassium chloride SA (K-DUR,KLOR-CON) 20 MEQ tablet Take 20 mEq by mouth 2 (two) times daily.    [provider]  rosuvastatin (CRESTOR) 40 MG tablet Take 40 mg by mouth daily.    [provider]  sertraline (ZOLOFT) 100 MG tablet Take 100 mg by mouth daily.    [provider]     Allergies Ace inhibitors; Eggs or egg-derived products; Other; Prednisone; Risedronate; Sulfa antibiotics; and Sulfasalazine   Family History  Problem Relation Age of Onset  . Heart attack Father     Social History Social History   Tobacco Use  . Smoking status: Never Smoker  . Smokeless tobacco: Never Used  Substance Use Topics  . Alcohol use: No  . Drug use: No    Review of Systems  Constitutional:   No fever or chills.  ENT:   No sore throat. No rhinorrhea. Cardiovascular:   No chest pain or syncope. Respiratory:   No dyspnea or cough. Gastrointestinal:   Negative for abdominal pain, vomiting and diarrhea.  Musculoskeletal:   Negative for focal pain or swelling All other systems reviewed and are negative except as documented above in ROS and HPI.  ____________________________________________   PHYSICAL EXAM:  VITAL SIGNS: ED Triage Vitals  Enc Vitals Group     BP 08/28/17 1809 (!) 185/65     Pulse Rate 08/28/17 1809 (!) 58     Resp 08/28/17 1809 20     Temp 08/28/17 1809 98.7 F (37.1 C)     Temp Source 08/28/17 1809 Oral     SpO2 08/28/17 1809 96 %     Weight 08/28/17 1810 196 lb (88.9 kg)     Height 08/28/17 1810 5' 2"  (1.575 m)     Head Circumference --      Peak Flow --      Pain Score --      Pain Loc --      Pain Edu? --      Excl. in [place]? --     Vital signs reviewed, nursing assessments  reviewed.   Constitutional:   Alert and oriented. Well appearing and in no distress. Eyes:   No scleral icterus.  EOMI.  PERRL. ENT   Head:   Normocephalic and atraumatic.      Neck:   No meningismus. Full ROM. Hematological/Lymphatic/Immunilogical:   No cervical lymphadenopathy. Cardiovascular:   Bradycardia heart rate 50. Symmetric bilateral radial and DP pulses.  No murmurs.  Respiratory:   Normal respiratory effort without tachypnea/retractions. Breath sounds are clear and equal bilaterally. No wheezes/rales/rhonchi. Gastrointestinal:   Soft and nontender. Non distended. There is no CVA tenderness.  No  rebound, rigidity, or guarding. Genitourinary:   deferred Musculoskeletal:   Normal range of motion in all extremities. No joint effusions.  No lower extremity tenderness.  No edema. Neurologic:   Normal speech and language.  Normal gait Motor grossly intact. No gross focal neurologic deficits are appreciated.   ____________________________________________    LABS (pertinent positives/negatives) (all labs ordered are listed, but only abnormal results are displayed) Labs Reviewed - No data to display ____________________________________________   EKG    ____________________________________________    RADIOLOGY  No results found.  ____________________________________________   PROCEDURES Procedures  ____________________________________________     CLINICAL IMPRESSION / ASSESSMENT AND PLAN / ED COURSE  Pertinent labs & imaging results that were available during my care of the patient were reviewed by me and considered in my medical decision making (see chart for details).   Patient presents with essential hypertension, poorly controlled, no worrisome symptoms. Actually with taking her home medication, on arrival to the treatment room her blood pressure has normalized. She does have bradycardia, but not significantly symptomatic. For this reason, I  instructed her to decrease her metoprolol dose to a half tablet in the morning and full tablet at night, and continue following up with her doctor in monitoring her blood pressure at home with her home blood pressure cuff. Return to the ED if any worrisome symptoms.  I doubt any significant sequela of the blood pressure elevation such as intracranial hemorrhage, ACS, PE dissection AAA or stroke.      ____________________________________________   FINAL CLINICAL IMPRESSION(S) / ED DIAGNOSES    Final diagnoses:  Essential hypertension      This SmartLink is deprecated. Use AVSMEDLIST instead to display the medication list for a patient.   Portions of this note were generated with dragon dictation software. Dictation errors may occur despite best attempts at proofreading.    Carrie Mew, MD 08/28/17 2120

## 2017-09-04 ENCOUNTER — Encounter (INDEPENDENT_AMBULATORY_CARE_PROVIDER_SITE_OTHER): Payer: Self-pay | Admitting: Vascular Surgery

## 2017-09-04 ENCOUNTER — Ambulatory Visit (INDEPENDENT_AMBULATORY_CARE_PROVIDER_SITE_OTHER): Payer: Medicare Other | Admitting: Vascular Surgery

## 2017-09-04 VITALS — BP 156/69 | HR 62 | Resp 16 | Ht 62.0 in | Wt 191.6 lb

## 2017-09-04 DIAGNOSIS — E1169 Type 2 diabetes mellitus with other specified complication: Secondary | ICD-10-CM | POA: Insufficient documentation

## 2017-09-04 DIAGNOSIS — R600 Localized edema: Secondary | ICD-10-CM | POA: Diagnosis not present

## 2017-09-04 DIAGNOSIS — E785 Hyperlipidemia, unspecified: Secondary | ICD-10-CM

## 2017-09-04 DIAGNOSIS — E669 Obesity, unspecified: Secondary | ICD-10-CM | POA: Insufficient documentation

## 2017-09-04 DIAGNOSIS — I10 Essential (primary) hypertension: Secondary | ICD-10-CM

## 2017-09-04 NOTE — Progress Notes (Signed)
Subjective:    Patient ID: [name], female    DOB: 1941-05-23, 76 y.o.   MRN: [id number] Chief Complaint  Patient presents with  . New Patient (Initial Visit)    HTN and swelling   Presents as a new patient referred by Grayland Ormond, PA for evaluation of refractory hypertension.  The patient was seen with her husband.  The patient endorses a history of hard to control blood pressure since March 2018.  The patient is currently taking hydralazine, losartan, metoprolol and furosemide.  The patient's blood pressure today was 156/69.  The patient also notes intermittent bilateral lower extremity edema.  The patient denies any discomfort associated with this edema.  The patient states her edema has improved when her "medications" were changed in March 2018.  The patient denies any claudication-like symptoms, rest pain or ulceration to the lower extremity.  The patient denies any fever, nausea or vomiting.   Review of Systems  Constitutional: Negative.   HENT: Negative.   Eyes: Negative.   Cardiovascular: Positive for leg swelling.       Refractory hypertension  Gastrointestinal: Negative.   Endocrine: Negative.   Genitourinary: Negative.   Musculoskeletal: Negative.   Skin: Negative.   Allergic/Immunologic: Negative.   Neurological: Negative.   Hematological: Negative.   Psychiatric/Behavioral: Negative.       Objective:   Physical Exam  Constitutional: She is oriented to person, place, and time. She appears well-developed and well-nourished. No distress.  HENT:  Head: Normocephalic and atraumatic.  Eyes: Conjunctivae are normal. Pupils are equal, round, and reactive to light.  Neck: Normal range of motion.  Cardiovascular: Normal rate, regular rhythm, normal heart sounds and intact distal pulses.  Pulses:      Radial pulses are 2+ on the right side, and 2+ on the left side.       Dorsalis pedis pulses are 2+ on the right side, and 2+ on the left side.       Posterior tibial  pulses are 2+ on the right side, and 2+ on the left side.  Pulmonary/Chest: Effort normal and breath sounds normal.  Musculoskeletal: Normal range of motion. She exhibits no edema (No edema noted to the bilateral lower extremity).  Neurological: She is alert and oriented to person, place, and time.  Skin: She is not diaphoretic.  Less than 1 cm scattered varicosities bilaterally.  There is no stasis dermatitis.  There is no skin changes.  No cellulitis.  Psychiatric: She has a normal mood and affect. Her behavior is normal. Judgment and thought content normal.  Vitals reviewed.  BP (!) 156/69 (BP Location: Left Arm)   Pulse 62   Resp 16   Ht 5' 2"  (1.575 m)   Wt 191 lb 9.6 oz (86.9 kg)   BMI 35.04 kg/m   Past Medical History:  Diagnosis Date  . Albuminuria   . Arthritis   . Asthma   . Cataract cortical, senile   . Diabetes mellitus without complication ([place])   . Hemorrhoids   . Hyperlipidemia   . Hypertension   . Lyme disease   . 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 OSA (obstructive sleep apnea)   . Osteoporosis   . Steatohepatitis   . Steatohepatitis    Social History   Socioeconomic History  . Marital status: Married    Spouse name: Not on file  . Number of children: Not on file  . Years of education: Not on file  . Highest education level: Not  on file  Social Needs  . Financial resource strain: Not on file  . Food insecurity - worry: Not on file  . Food insecurity - inability: Not on file  . Transportation needs - medical: Not on file  . Transportation needs - non-medical: Not on file  Occupational History  . Not on file  Tobacco Use  . Smoking status: Never Smoker  . Smokeless tobacco: Never Used  Substance and Sexual Activity  . Alcohol use: No  . Drug use: No  . Sexual activity: Not on file  Other Topics Concern  . Not on file  Social History Narrative  . Not on file   Past Surgical History:  Procedure Laterality Date  . ABDOMINAL HYSTERECTOMY    .  APPENDECTOMY    . [place]  . CARDIAC CATHETERIZATION  08/13/2014   [facility]. no significant CAD, normal LVEDP.   Marland Kitchen CATARACT EXTRACTION    . CHOLECYSTECTOMY    . COLONOSCOPY    . COLONOSCOPY WITH PROPOFOL N/A 12/07/2016   Procedure: COLONOSCOPY WITH PROPOFOL;  Surgeon: Lollie Sails, MD;  Location: [facility] ENDOSCOPY;  Service: Endoscopy;  Laterality: N/A;  . ESOPHAGOGASTRODUODENOSCOPY    . ESOPHAGOGASTRODUODENOSCOPY (EGD) WITH PROPOFOL N/A 12/07/2016   Procedure: ESOPHAGOGASTRODUODENOSCOPY (EGD) WITH PROPOFOL;  Surgeon: Lollie Sails, MD;  Location: [facility] ENDOSCOPY;  Service: Endoscopy;  Laterality: N/A;  . HEMORRHOID SURGERY    . PARTIAL HYSTERECTOMY    . TONSILLECTOMY     Family History  Problem Relation Age of Onset  . Heart attack Father    Allergies  Allergen Reactions  . Ace Inhibitors     Other reaction(s): Unknown  . Eggs Or Egg-Derived Products Diarrhea  . Other     Other reaction(s): Other (See Comments) Eggs  . Prednisone     Other reaction(s): Other (See Comments) joint pain  . Risedronate     Other reaction(s): Other (See Comments)  . Sulfa Antibiotics     Other reaction(s): Other (See Comments)  . Sulfasalazine Other (See Comments)      Assessment & Plan:  Presents as a new patient referred by Grayland Ormond, PA for evaluation of refractory hypertension.  The patient was seen with her husband.  The patient endorses a history of hard to control blood pressure since March 2018.  The patient is currently taking hydralazine, losartan, metoprolol and furosemide.  The patient's blood pressure today was 156/69.  The patient also notes intermittent bilateral lower extremity edema.  The patient denies any discomfort associated with this edema.  The patient states her edema has improved when her "medications" were changed in March 2018.  The patient denies any claudication-like symptoms, rest pain or ulceration to the lower extremity.  The patient  denies any fever, nausea or vomiting.  1. Essential hypertension - New Patient is currently on 3 or more antihypertensive medications with elevated blood pressures. The patient's blood pressure today is 156/69 Recommend a renal artery duplex to assess for any renal artery stenosis that may be contributing to the patient's elevated blood pressure I have discussed with the patient at length the risk factors for and pathogenesis of atherosclerotic disease and encouraged a healthy diet, regular exercise regimen and blood pressure / glucose control.  The patient will follow up with me after her duplex to review the results  - VAS [place]; Future  2. Bilateral lower extremity edema - New Patient experiences intermittent bilateral lower extremity edema This has seemed  to improve after her medications were adjusted in March 2018 There was no edema noted on today's exam The patient was encouraged to wear graduated compression stockings (20-30 mmHg) on a daily basis. The patient was instructed to begin wearing the stockings first thing in the morning and removing them in the evening. The patient was instructed specifically not to sleep in the stockings. Prescription In addition, behavioral modification including elevation during the day will be initiated. Anti-inflammatories for pain. We can revisit undergoing a bilateral lower extremity venous duplex in the future if the patient's edema becomes symptomatic.  3. Hyperlipidemia, unspecified hyperlipidemia type - Stable Encouraged good control as its slows the progression of atherosclerotic disease  4. Diabetes mellitus type 2 in obese (HCC) - Stable Encouraged good control as its slows the progression of atherosclerotic disease  Current Outpatient Medications on File Prior to Visit  Medication Sig Dispense Refill  . albuterol (PROVENTIL HFA;VENTOLIN HFA) 108 (90 BASE) MCG/ACT inhaler Inhale 1-2 puffs into the lungs every 4 (four)  hours as needed for wheezing or shortness of breath.    Marland Kitchen amLODipine (NORVASC) 5 MG tablet     . calcium carbonate (OS-CAL - DOSED IN MG OF ELEMENTAL CALCIUM) 1250 (500 Ca) MG tablet Take 1 tablet by mouth.    . diltiazem (TIAZAC) 360 MG 24 hr capsule TAKE 1 CAPSULE EVERY DAY 30 capsule 3  . esomeprazole (NEXIUM) 40 MG capsule Take by mouth.    . fluticasone (FLONASE) 50 MCG/ACT nasal spray USE 2 SPRAYS IN EACH NOSTRIL DAILY    . Fluticasone-Salmeterol (ADVAIR) 250-50 MCG/DOSE AEPB Inhale 1 puff into the lungs 2 (two) times daily.    . hydrALAZINE (APRESOLINE) 100 MG tablet Take by mouth.    . losartan-hydrochlorothiazide (HYZAAR) 100-25 MG per tablet Take 1 tablet by mouth daily.    . metFORMIN (GLUCOPHAGE) 500 MG tablet Take 500 mg by mouth 2 (two) times daily with a meal.     . metoprolol tartrate (LOPRESSOR) 50 MG tablet     . montelukast (SINGULAIR) 10 MG tablet Take 10 mg by mouth at bedtime.    . potassium chloride SA (K-DUR,KLOR-CON) 20 MEQ tablet Take 20 mEq by mouth 2 (two) times daily.    . rosuvastatin (CRESTOR) 40 MG tablet Take 40 mg by mouth daily.    . sertraline (ZOLOFT) 100 MG tablet Take 100 mg by mouth daily.    Marland Kitchen torsemide (DEMADEX) 10 MG tablet Take by mouth.    . cyclobenzaprine (FLEXERIL) 10 MG tablet Take 10 mg by mouth.    . Fluticasone-Salmeterol (ADVAIR DISKUS) 250-50 MCG/DOSE AEPB Inhale into the lungs.    . pantoprazole (PROTONIX) 40 MG tablet Take 40 mg by mouth daily.     No current facility-administered medications on file prior to visit.    There are no Patient Instructions on file for this visit. No Follow-up on file.  Nalda Shackleford A Ragen Laver, PA-C

## 2017-09-07 ENCOUNTER — Ambulatory Visit (INDEPENDENT_AMBULATORY_CARE_PROVIDER_SITE_OTHER): Payer: Medicare Other

## 2017-09-07 ENCOUNTER — Ambulatory Visit (INDEPENDENT_AMBULATORY_CARE_PROVIDER_SITE_OTHER): Payer: Medicare Other | Admitting: Vascular Surgery

## 2017-09-07 ENCOUNTER — Encounter (INDEPENDENT_AMBULATORY_CARE_PROVIDER_SITE_OTHER): Payer: Self-pay | Admitting: Vascular Surgery

## 2017-09-07 VITALS — BP 184/72 | HR 50 | Resp 16 | Ht 62.0 in | Wt 191.0 lb

## 2017-09-07 DIAGNOSIS — I10 Essential (primary) hypertension: Secondary | ICD-10-CM | POA: Diagnosis not present

## 2017-09-07 DIAGNOSIS — E1169 Type 2 diabetes mellitus with other specified complication: Secondary | ICD-10-CM | POA: Diagnosis not present

## 2017-09-07 DIAGNOSIS — E669 Obesity, unspecified: Secondary | ICD-10-CM

## 2017-09-07 NOTE — Progress Notes (Signed)
MRN : [id number]  [name] is a 76 y.o. (10-04-1940) female who presents with chief complaint of  Chief Complaint  Patient presents with  . Follow-up    bil renal art  .  History of Present Illness: Patient returns today in follow up of severe poorly controlled blood pressure with her renal artery duplex today.  She reports no major changes or issues.  Her renal artery duplex today reveals no evidence of significant renal artery stenosis bilaterally.  Current Outpatient Medications  Medication Sig Dispense Refill  . albuterol (PROVENTIL HFA;VENTOLIN HFA) 108 (90 BASE) MCG/ACT inhaler Inhale 1-2 puffs into the lungs every 4 (four) hours as needed for wheezing or shortness of breath.    Marland Kitchen amLODipine (NORVASC) 5 MG tablet     . calcium carbonate (OS-CAL - DOSED IN MG OF ELEMENTAL CALCIUM) 1250 (500 Ca) MG tablet Take 1 tablet by mouth.    . cyclobenzaprine (FLEXERIL) 10 MG tablet Take 10 mg by mouth.    . diltiazem (TIAZAC) 360 MG 24 hr capsule TAKE 1 CAPSULE EVERY DAY 30 capsule 3  . esomeprazole (NEXIUM) 40 MG capsule Take by mouth.    . fluticasone (FLONASE) 50 MCG/ACT nasal spray USE 2 SPRAYS IN EACH NOSTRIL DAILY    . Fluticasone-Salmeterol (ADVAIR DISKUS) 250-50 MCG/DOSE AEPB Inhale into the lungs.    . Fluticasone-Salmeterol (ADVAIR) 250-50 MCG/DOSE AEPB Inhale 1 puff into the lungs 2 (two) times daily.    . hydrALAZINE (APRESOLINE) 100 MG tablet Take by mouth.    . losartan-hydrochlorothiazide (HYZAAR) 100-25 MG per tablet Take 1 tablet by mouth daily.    . metFORMIN (GLUCOPHAGE) 500 MG tablet Take 500 mg by mouth 2 (two) times daily with a meal.     . metoprolol tartrate (LOPRESSOR) 50 MG tablet     . montelukast (SINGULAIR) 10 MG tablet Take 10 mg by mouth at bedtime.    . pantoprazole (PROTONIX) 40 MG tablet Take 40 mg by mouth daily.    . potassium chloride SA (K-DUR,KLOR-CON) 20 MEQ tablet Take 20 mEq by mouth 2 (two) times daily.    . rosuvastatin (CRESTOR) 40 MG  tablet Take 40 mg by mouth daily.    . sertraline (ZOLOFT) 100 MG tablet Take 100 mg by mouth daily.    Marland Kitchen torsemide (DEMADEX) 10 MG tablet Take by mouth.     No current facility-administered medications for this visit.     Past Medical History:  Diagnosis Date  . Albuminuria   . Arthritis   . Asthma   . Cataract cortical, senile   . Diabetes mellitus without complication ([place])   . Hemorrhoids   . Hyperlipidemia   . Hypertension   . Lyme disease   . 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 OSA (obstructive sleep apnea)   . Osteoporosis   . Steatohepatitis   . Steatohepatitis     Past Surgical History:  Procedure Laterality Date  . ABDOMINAL HYSTERECTOMY    . APPENDECTOMY    . [place]  . CARDIAC CATHETERIZATION  08/13/2014   [facility]. no significant CAD, normal LVEDP.   Marland Kitchen CATARACT EXTRACTION    . CHOLECYSTECTOMY    . COLONOSCOPY    . COLONOSCOPY WITH PROPOFOL N/A 12/07/2016   Procedure: COLONOSCOPY WITH PROPOFOL;  Surgeon: Lollie Sails, MD;  Location: [facility] ENDOSCOPY;  Service: Endoscopy;  Laterality: N/A;  . ESOPHAGOGASTRODUODENOSCOPY    . ESOPHAGOGASTRODUODENOSCOPY (EGD) WITH PROPOFOL N/A 12/07/2016   Procedure:  ESOPHAGOGASTRODUODENOSCOPY (EGD) WITH PROPOFOL;  Surgeon: Lollie Sails, MD;  Location: [facility] ENDOSCOPY;  Service: Endoscopy;  Laterality: N/A;  . HEMORRHOID SURGERY    . PARTIAL HYSTERECTOMY    . TONSILLECTOMY      Social History Social History   Tobacco Use  . Smoking status: Never Smoker  . Smokeless tobacco: Never Used  Substance Use Topics  . Alcohol use: No  . Drug use: No     Family History Family History  Problem Relation Age of Onset  . Heart attack Father      Allergies  Allergen Reactions  . Ace Inhibitors     Other reaction(s): Unknown  . Eggs Or Egg-Derived Products Diarrhea  . Other     Other reaction(s): Other (See Comments) Eggs  . Prednisone     Other reaction(s): Other (See Comments) joint pain  .  Risedronate     Other reaction(s): Other (See Comments)  . Sulfa Antibiotics     Other reaction(s): Other (See Comments)  . Sulfasalazine Other (See Comments)     REVIEW OF SYSTEMS (Negative unless checked)  Constitutional: [] Weight loss  [] Fever  [] Chills Cardiac: [] Chest pain   [] Chest pressure   [] Palpitations   [] Shortness of breath when laying flat   [] Shortness of breath at rest   [] Shortness of breath with exertion. Vascular:  [] Pain in legs with walking   [] Pain in legs at rest   [] Pain in legs when laying flat   [] Claudication   [] Pain in feet when walking  [] Pain in feet at rest  [] Pain in feet when laying flat   [] History of DVT   [] Phlebitis   [] Swelling in legs   [] Varicose veins   [] Non-healing ulcers Pulmonary:   [] Uses home oxygen   [] Productive cough   [] Hemoptysis   [] Wheeze  [] COPD   [x] Asthma Neurologic:  [] Dizziness  [] Blackouts   [] Seizures   [] History of stroke   [] History of TIA  [] Aphasia   [] Temporary blindness   [] Dysphagia   [] Weakness or numbness in arms   [] Weakness or numbness in legs Musculoskeletal:  [x] Arthritis   [] Joint swelling   [x] Joint pain   [] Low back pain Hematologic:  [] Easy bruising  [] Easy bleeding   [] Hypercoagulable state   [] Anemic   Gastrointestinal:  [] Blood in stool   [] Vomiting blood  [x] Gastroesophageal reflux/heartburn   [] Abdominal pain Genitourinary:  [] Chronic kidney disease   [] Difficult urination  [] Frequent urination  [] Burning with urination   [] Hematuria Skin:  [] Rashes   [] Ulcers   [] Wounds Psychological:  [] History of anxiety   []  History of major depression.  Physical Examination  BP (!) 184/72 (BP Location: Left Arm)   Pulse (!) 50   Resp 16   Ht 5' 2"  (1.575 m)   Wt 86.6 kg (191 lb)   BMI 34.93 kg/m  Gen:  WD/WN, NAD Head: [place]/AT, No temporalis wasting. Ear/Nose/Throat: Hearing grossly intact, nares w/o erythema or drainage, trachea midline Eyes: Conjunctiva clear. Sclera non-icteric Neck: Supple.  No JVD.    Pulmonary:  Good air movement, no use of accessory muscles.  Cardiac: RRR, no JVD Vascular:  Vessel Right Left  Radial Palpable Palpable                                    Musculoskeletal: M/S 5/5 throughout.  No deformity or atrophy.  Neurologic: Sensation grossly intact in extremities.  Symmetrical.  Speech is fluent.  Psychiatric: Judgment intact, Mood & affect appropriate for pt's clinical situation. Dermatologic: No rashes or ulcers noted.  No cellulitis or open wounds.       Labs No results found for this or any previous visit (from the past 2160 hour(s)).  Radiology No results found.    Assessment/Plan  Diabetes mellitus type 2 in obese (HCC) blood glucose control important in reducing the progression of atherosclerotic disease. Also, involved in wound healing. On appropriate medications.   Essential hypertension Her renal artery duplex today reveals no evidence of significant renal artery stenosis bilaterally.  It does not appears that she has evidence of renovascular hypertension.  No vascular intervention or further evaluation is warranted at this time.  She can return on an as-needed basis.    Leotis Pain, MD  09/07/2017 11:43 AM    This note was created with Dragon medical transcription system.  Any errors from dictation are purely unintentional

## 2017-09-07 NOTE — Assessment & Plan Note (Signed)
blood glucose control important in reducing the progression of atherosclerotic disease. Also, involved in wound healing. On appropriate medications.  

## 2017-09-07 NOTE — Assessment & Plan Note (Signed)
Her renal artery duplex today reveals no evidence of significant renal artery stenosis bilaterally.  It does not appears that she has evidence of renovascular hypertension.  No vascular intervention or further evaluation is warranted at this time.  She can return on an as-needed basis.

## 2017-09-28 ENCOUNTER — Other Ambulatory Visit
Admission: RE | Admit: 2017-09-28 | Discharge: 2017-09-28 | Disposition: A | Discharge status: Home / Self Care | Payer: Medicare Other | Source: Ambulatory Visit | Attending: Gastroenterology | Admitting: Gastroenterology

## 2017-09-28 DIAGNOSIS — R197 Diarrhea, unspecified: Secondary | ICD-10-CM | POA: Diagnosis present

## 2017-09-28 LAB — C DIFFICILE QUICK SCREEN W PCR REFLEX
C DIFFICLE (CDIFF) ANTIGEN: NEGATIVE
C Diff interpretation: NOT DETECTED
C Diff toxin: NEGATIVE

## 2017-09-28 LAB — GASTROINTESTINAL PANEL BY PCR, STOOL (REPLACES STOOL CULTURE)

## 2017-10-05 LAB — PANCREATIC ELASTASE, FECAL: Pancreatic Elastase-1, Stool: >500 ug Elast./g (ref >200)

## 2017-12-31 ENCOUNTER — Other Ambulatory Visit: Payer: Self-pay | Admitting: Internal Medicine

## 2017-12-31 DIAGNOSIS — Z1231 Encounter for screening mammogram for malignant neoplasm of breast: Secondary | ICD-10-CM

## 2018-01-04 ENCOUNTER — Encounter: Payer: Self-pay | Admitting: Student

## 2018-01-07 ENCOUNTER — Encounter: Payer: Self-pay | Admitting: Anesthesiology

## 2018-01-07 ENCOUNTER — Other Ambulatory Visit: Payer: Self-pay

## 2018-01-07 ENCOUNTER — Ambulatory Visit: Payer: Medicare Other | Admitting: Anesthesiology

## 2018-01-07 ENCOUNTER — Ambulatory Visit
Admission: RE | Admit: 2018-01-07 | Discharge: 2018-01-07 | Disposition: A | Discharge status: Home / Self Care | Payer: Medicare Other | Source: Ambulatory Visit | Attending: Gastroenterology | Admitting: Gastroenterology

## 2018-01-07 ENCOUNTER — Encounter
Admission: RE | Disposition: A | Discharge status: Home / Self Care | Payer: Self-pay | Source: Ambulatory Visit | Attending: Gastroenterology

## 2018-01-07 DIAGNOSIS — K317 Polyp of stomach and duodenum: Secondary | ICD-10-CM | POA: Diagnosis not present

## 2018-01-07 DIAGNOSIS — Z7984 Long term (current) use of oral hypoglycemic drugs: Secondary | ICD-10-CM | POA: Insufficient documentation

## 2018-01-07 DIAGNOSIS — Z79899 Other long term (current) drug therapy: Secondary | ICD-10-CM | POA: Insufficient documentation

## 2018-01-07 DIAGNOSIS — Z7951 Long term (current) use of inhaled steroids: Secondary | ICD-10-CM | POA: Diagnosis not present

## 2018-01-07 DIAGNOSIS — I10 Essential (primary) hypertension: Secondary | ICD-10-CM | POA: Diagnosis not present

## 2018-01-07 DIAGNOSIS — G4733 Obstructive sleep apnea (adult) (pediatric): Secondary | ICD-10-CM | POA: Insufficient documentation

## 2018-01-07 DIAGNOSIS — E785 Hyperlipidemia, unspecified: Secondary | ICD-10-CM | POA: Diagnosis not present

## 2018-01-07 DIAGNOSIS — Z09 Encounter for follow-up examination after completed treatment for conditions other than malignant neoplasm: Secondary | ICD-10-CM | POA: Insufficient documentation

## 2018-01-07 DIAGNOSIS — K3189 Other diseases of stomach and duodenum: Secondary | ICD-10-CM | POA: Insufficient documentation

## 2018-01-07 DIAGNOSIS — K210 Gastro-esophageal reflux disease with esophagitis: Secondary | ICD-10-CM | POA: Insufficient documentation

## 2018-01-07 DIAGNOSIS — J45909 Unspecified asthma, uncomplicated: Secondary | ICD-10-CM | POA: Insufficient documentation

## 2018-01-07 HISTORY — PX: ESOPHAGOGASTRODUODENOSCOPY (EGD) WITH PROPOFOL: SHX5813

## 2018-01-07 HISTORY — DX: Gastro-esophageal reflux disease with esophagitis: K21.0

## 2018-01-07 HISTORY — DX: Personal history of urinary calculi: Z87.442

## 2018-01-07 HISTORY — DX: Gastro-esophageal reflux disease without esophagitis: K21.9

## 2018-01-07 HISTORY — DX: Gastro-esophageal reflux disease with esophagitis, without bleeding: K21.00

## 2018-01-07 HISTORY — DX: Malignant (primary) neoplasm, unspecified: C80.1

## 2018-01-07 LAB — GLUCOSE, CAPILLARY: Glucose-Capillary: 110 mg/dL — ABNORMAL HIGH (ref 65–99)

## 2018-01-07 SURGERY — ESOPHAGOGASTRODUODENOSCOPY (EGD) WITH PROPOFOL
Anesthesia: General

## 2018-01-07 MED ORDER — PROPOFOL 10 MG/ML IV BOLUS
INTRAVENOUS | Status: DC | PRN
  Administered 2018-01-07: 40 mg via INTRAVENOUS

## 2018-01-07 MED ORDER — PROPOFOL 500 MG/50ML IV EMUL
INTRAVENOUS | Status: DC | PRN
  Administered 2018-01-07: 140 ug/kg/min via INTRAVENOUS

## 2018-01-07 MED ORDER — SODIUM CHLORIDE 0.9 % IV SOLN
INTRAVENOUS | Status: DC
  Administered 2018-01-07 (×2): via INTRAVENOUS

## 2018-01-07 MED ORDER — PROPOFOL 500 MG/50ML IV EMUL
INTRAVENOUS | Status: AC
Start: 2018-01-07 — End: 2018-01-07
  Filled 2018-01-07: qty 50

## 2018-01-07 MED ORDER — FENTANYL CITRATE (PF) 100 MCG/2ML IJ SOLN
INTRAMUSCULAR | Status: AC
  Filled 2018-01-07: qty 2

## 2018-01-07 MED ORDER — FENTANYL CITRATE (PF) 100 MCG/2ML IJ SOLN
INTRAMUSCULAR | Status: DC | PRN
  Administered 2018-01-07: 50 ug via INTRAVENOUS

## 2018-01-07 MED ORDER — LIDOCAINE HCL (CARDIAC) 20 MG/ML IV SOLN
INTRAVENOUS | Status: DC | PRN
  Administered 2018-01-07: 100 mg via INTRAVENOUS

## 2018-01-07 NOTE — Anesthesia Postprocedure Evaluation (Signed)
Anesthesia Post Note  Patient: [name]  Procedure(s) Performed: ESOPHAGOGASTRODUODENOSCOPY (EGD) WITH PROPOFOL (N/A )  Patient location during evaluation: Endoscopy Anesthesia Type: General Level of consciousness: awake and alert Pain management: pain level controlled Vital Signs Assessment: post-procedure vital signs reviewed and stable Respiratory status: spontaneous breathing, nonlabored ventilation, respiratory function stable and patient connected to nasal cannula oxygen Cardiovascular status: blood pressure returned to baseline and stable Postop Assessment: no apparent nausea or vomiting Anesthetic complications: no     Last Vitals:  Vitals:   01/07/18 0944 01/07/18 0954  BP: (!) 142/50 (!) 146/46  Pulse: (!) 54 (!) 55  Resp: 14 19  Temp:    SpO2: 94% 94%    Last Pain:  Vitals:   01/07/18 0954  TempSrc:   PainSc: 0-No pain                 Roseanne Juenger S

## 2018-01-07 NOTE — Transfer of Care (Signed)
Immediate Anesthesia Transfer of Care Note  Patient: [name]  Procedure(s) Performed: ESOPHAGOGASTRODUODENOSCOPY (EGD) WITH PROPOFOL (N/A )  Patient Location:    Anesthesia Type:General  Level of Consciousness: awake  Airway & Oxygen Therapy: Patient Spontanous Breathing and Patient connected to nasal cannula oxygen  Post-op Assessment: Report given to RN and Post -op Vital signs reviewed and stable  Post vital signs: Reviewed and stable  Last Vitals:  Vitals Value Taken Time  BP 167/60 01/07/2018  9:26 AM  Temp 36.1 C 01/07/2018  9:24 AM  Pulse 54 01/07/2018  9:28 AM  Resp 18 01/07/2018  9:28 AM  SpO2 96 % 01/07/2018  9:28 AM  Vitals shown include unvalidated device data.  Last Pain:  Vitals:   01/07/18 0924  TempSrc: Tympanic  PainSc: 0-No pain         Complications: No apparent anesthesia complications

## 2018-01-07 NOTE — Anesthesia Post-op Follow-up Note (Signed)
Anesthesia QCDR form completed.        

## 2018-01-07 NOTE — Anesthesia Procedure Notes (Signed)
Date/Time: 01/07/2018 8:55 AM Performed by: Allean Found, CRNA Pre-anesthesia Checklist: Patient identified, Emergency Drugs available, Suction available, Patient being monitored and Timeout performed Oxygen Delivery Method: Nasal cannula Placement Confirmation: positive ETCO2

## 2018-01-07 NOTE — Op Note (Signed)
[facility] Gastroenterology Patient Name: [name] Procedure Date: 01/07/2018 8:28 AM MRN: [id number] Account #: [id number] Date of Birth: 1940-10-04 Admit Type: Outpatient Age: 77 Room: [facility] ENDO ROOM 3 Gender: Female Note Status: Finalized Procedure:            Upper GI endoscopy Indications:          Surveillance procedure Providers:            Lollie Sails, MD Referring MD:         Ocie Cornfield. Ouida Sills MD, MD (Referring MD) Medicines:            Monitored Anesthesia Care Complications:        No immediate complications. Procedure:            Pre-Anesthesia Assessment:                       - ASA Grade Assessment: III - A patient with severe                        systemic disease.                       After obtaining informed consent, the endoscope was                        passed under direct vision. Throughout the procedure,                        the patient's blood pressure, pulse, and oxygen                        saturations were monitored continuously. The Endoscope                        was introduced through the mouth, and advanced to the                        third part of duodenum. The upper GI endoscopy was                        accomplished without difficulty. The patient tolerated                        the procedure well. Findings:      The Z-line was regular. Biopsies were taken with a cold forceps for       histology.      A single 7-8 mm semi-sessile polyp with no bleeding and no stigmata of       recent bleeding was found in the cardia. Biopsies were taken with a cold       forceps for histology.      The exam of the stomach was otherwise normal. There is no inflamation       noted in the cardia in retroflex evaluation.      Diffuse mild mucosal variance characterized by smoothness was found in       the entire duodenum. Biopsies were taken with a cold forceps for       histology. Impression:           - Z-line regular.  Biopsied.                       -  A single gastric polyp. Biopsied.                       - Mucosal variant in the duodenum. Biopsied. Recommendation:       - Use Nexium (esomeprazole) 40 mg PO daily daily.                       - Return to GI clinic in 4 weeks. Procedure Code(s):    --- Professional ---                       [phone], Esophagogastroduodenoscopy, flexible, transoral;                        with biopsy, single or multiple Diagnosis Code(s):    --- Professional ---                       K31.7, Polyp of stomach and duodenum                       K31.89, Other diseases of stomach and duodenum CPT copyright 2017 American Medical Association. All rights reserved. The codes documented in this report are preliminary and upon coder review may  be revised to meet current compliance requirements. Lollie Sails, MD 01/07/2018 9:14:07 AM This report has been signed electronically. Number of Addenda: 0 Note Initiated On: 01/07/2018 8:28 AM      [facility]

## 2018-01-07 NOTE — H&P (Signed)
Outpatient short stay form Pre-procedure 01/07/2018 8:35 AM [name] Sails MD  Primary Physician: Dr. Frazier Richards  Reason for visit: EGD  History of present illness: Follow-up gastric polyps/gastritis.  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  She is presenting today for follow-up on these.  She has been now on a proton pump inhibitor regularly.  He denies any symptoms such as nausea vomiting or abdominal pain.  He continues to have some loose stools however he is taking metformin.  Takes no aspirin or blood thinning agent.  Patient did try switching to the tonics however feels that Nexium works better for her and now she is on that again.    Current Facility-Administered Medications:  .  0.9 %  sodium chloride infusion, , Intravenous, Continuous, [name] Sails, MD  Medications Prior to Admission  Medication Sig Dispense Refill Last Dose  . albuterol (PROVENTIL HFA;VENTOLIN HFA) 108 (90 BASE) MCG/ACT inhaler Inhale 1-2 puffs into the lungs every 4 (four) hours as needed for wheezing or shortness of breath.   Past Month at Unknown time  . amLODipine (NORVASC) 5 MG tablet    01/07/2018 at 0600  . calcium carbonate (OS-CAL - DOSED IN MG OF ELEMENTAL CALCIUM) 1250 (500 Ca) MG tablet Take 1 tablet by mouth.   01/06/2018 at 0700  . diltiazem (TIAZAC) 360 MG 24 hr capsule TAKE 1 CAPSULE EVERY DAY 30 capsule 3 01/07/2018 at 0600  . esomeprazole (NEXIUM) 40 MG capsule Take by mouth.   01/07/2018 at 0600  . fluticasone (FLONASE) 50 MCG/ACT nasal spray USE 2 SPRAYS IN EACH NOSTRIL DAILY   Past Month at Unknown time  . Fluticasone-Salmeterol (ADVAIR DISKUS) 250-50 MCG/DOSE AEPB Inhale into the lungs.   01/07/2018 at 0600  . hydrALAZINE (APRESOLINE) 100 MG tablet Take by mouth.   01/07/2018 at 0600  . losartan (COZAAR) 100 MG tablet Take 100 mg by mouth daily.   01/07/2018 at 0600  . metFORMIN (GLUCOPHAGE) 500 MG tablet Take 500  mg by mouth 2 (two) times daily with a meal.    01/06/2018 at 1800  . metoprolol tartrate (LOPRESSOR) 50 MG tablet    01/07/2018 at 0600  . montelukast (SINGULAIR) 10 MG tablet Take 10 mg by mouth at bedtime.   01/06/2018 at 1800  . potassium chloride SA (K-DUR,KLOR-CON) 20 MEQ tablet Take 20 mEq by mouth 2 (two) times daily.   01/06/2018 at 1800  . rosuvastatin (CRESTOR) 40 MG tablet Take 40 mg by mouth daily.   01/06/2018 at 1800  . sertraline (ZOLOFT) 100 MG tablet Take 100 mg by mouth daily.   01/06/2018 at 1800  . torsemide (DEMADEX) 10 MG tablet Take by mouth.   01/07/2018 at 0600  . colestipol (COLESTID) 1 g tablet Take 1 g by mouth 2 (two) times daily.   Not Taking at Unknown time  . cyclobenzaprine (FLEXERIL) 10 MG tablet Take 10 mg by mouth.   Not Taking at Unknown time  . Fluticasone-Salmeterol (ADVAIR) 250-50 MCG/DOSE AEPB Inhale 1 puff into the lungs 2 (two) times daily.   Not Taking at Unknown time  . losartan-hydrochlorothiazide (HYZAAR) 100-25 MG per tablet Take 1 tablet by mouth daily.   Not Taking at Unknown time  . pantoprazole (PROTONIX) 40 MG tablet Take 40 mg by mouth daily.   Not Taking at Unknown time     Allergies  Allergen Reactions  . Ace Inhibitors  Other reaction(s): Unknown  . Eggs Or Egg-Derived Products Diarrhea  . Other     Other reaction(s): Other (See Comments) Eggs  . Prednisone     Other reaction(s): Other (See Comments) joint pain  . Risedronate     Other reaction(s): Other (See Comments)  . Sulfa Antibiotics     Other reaction(s): Other (See Comments)  . Sulfasalazine Other (See Comments)     Past Medical History:  Diagnosis Date  . Albuminuria   . Arthritis   . Asthma   . Cancer ([place])   . Cataract cortical, senile   . Diabetes mellitus without complication ([place])   . GERD (gastroesophageal reflux disease)   . Hemorrhoids   . History of kidney stones   . Hyperlipidemia   . Hypertension   . Lyme disease   . 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 OSA  (obstructive sleep apnea)   . Osteoporosis   . Osteoporosis   . Reflux esophagitis   . Steatohepatitis   . Steatohepatitis     Review of systems:      Physical Exam    Heart and lungs: Regular rate and rhythm without rub or gallop, lungs are bilaterally clear.    HEENT: Normocephalic atraumatic eyes are anicteric    Other:    Pertinant exam for procedure: Soft nontender nondistended bowel sounds positive normoactive.    Planned proceedures: EGD and indicated procedures. I have discussed the risks benefits and complications of procedures to include not limited to bleeding, infection, perforation and the risk of sedation and the patient wishes to proceed.    [name] Sails, MD Gastroenterology 01/07/2018  8:35 AM

## 2018-01-07 NOTE — Anesthesia Preprocedure Evaluation (Signed)
Anesthesia Evaluation  Patient identified by MRN, date of birth, ID band Patient awake    Reviewed: Allergy & Precautions, NPO status , Patient's Chart, lab work & pertinent test results, reviewed documented beta blocker date and time   Airway Mallampati: III  TM Distance: >3 FB     Dental  (+) Chipped   Pulmonary shortness of breath, asthma , sleep apnea ,           Cardiovascular hypertension, Pt. on medications and Pt. on home beta blockers      Neuro/Psych    GI/Hepatic GERD  ,(+) Hepatitis -  Endo/Other  diabetes, Type 2  Renal/GU Renal disease     Musculoskeletal  (+) Arthritis ,   Abdominal   Peds  Hematology   Anesthesia Other Findings Hx of AF.  Reproductive/Obstetrics                             Anesthesia Physical Anesthesia Plan  ASA: III  Anesthesia Plan: General   Post-op Pain Management:    Induction: Intravenous  PONV Risk Score and Plan:   Airway Management Planned:   Additional Equipment:   Intra-op Plan:   Post-operative Plan:   Informed Consent: I have reviewed the patients History and Physical, chart, labs and discussed the procedure including the risks, benefits and alternatives for the proposed anesthesia with the patient or authorized representative who has indicated his/her understanding and acceptance.     Plan Discussed with: CRNA  Anesthesia Plan Comments:         Anesthesia Quick Evaluation

## 2018-01-08 ENCOUNTER — Encounter: Payer: Self-pay | Admitting: Gastroenterology

## 2018-01-09 LAB — SURGICAL PATHOLOGY

## 2018-01-14 ENCOUNTER — Ambulatory Visit
Admission: RE | Admit: 2018-01-14 | Discharge: 2018-01-14 | Disposition: A | Discharge status: Home / Self Care | Payer: Medicare Other | Source: Ambulatory Visit | Attending: Internal Medicine | Admitting: Internal Medicine

## 2018-01-14 DIAGNOSIS — Z1231 Encounter for screening mammogram for malignant neoplasm of breast: Secondary | ICD-10-CM | POA: Diagnosis present

## 2018-08-26 ENCOUNTER — Encounter: Payer: Self-pay | Admitting: Urology

## 2018-08-26 ENCOUNTER — Ambulatory Visit (INDEPENDENT_AMBULATORY_CARE_PROVIDER_SITE_OTHER): Payer: Medicare Other | Admitting: Urology

## 2018-08-26 VITALS — BP 148/54 | HR 75 | Ht 62.0 in | Wt 211.0 lb

## 2018-08-26 DIAGNOSIS — N329 Bladder disorder, unspecified: Secondary | ICD-10-CM | POA: Diagnosis not present

## 2018-08-26 DIAGNOSIS — R350 Frequency of micturition: Secondary | ICD-10-CM | POA: Diagnosis not present

## 2018-08-26 LAB — MICROSCOPIC EXAMINATION: RBC, UA: NONE SEEN /hpf (ref 0–2)

## 2018-08-26 LAB — URINALYSIS, COMPLETE
BILIRUBIN UA: NEGATIVE
Glucose, UA: NEGATIVE
Ketones, UA: NEGATIVE
LEUKOCYTES UA: NEGATIVE
Nitrite, UA: NEGATIVE
PH UA: 5 (ref 5.0–7.5)
RBC UA: NEGATIVE
Specific Gravity, UA: 1.02 (ref 1.005–1.030)
Urobilinogen, Ur: 0.2 mg/dL (ref 0.2–1.0)

## 2018-08-26 NOTE — Progress Notes (Signed)
08/26/2018 1:20 PM   [name] 01-30-1941 [id number]  Referring provider: Kirk Ruths, MD [facility] [place], [place] [address]  CC: History of bladder tumor, unclear pathology, stress incontinence  HPI: I saw [name] in urology clinic today for history of a bladder tumor and stress incontinence.  She was previously followed by [name].  Her records are not very clear, and she is a poor historian.  Briefly, she is a 78 year old obese female with history of severe stress urinary incontinence and reported bladder tumor.  She reports she was previously followed by [name] who diagnosed her with a bladder tumor on cystoscopy, however when she underwent surgery for this tumor it had "disappeared" after she had prayed about it.  She also reports that 2 weeks ago she passed a large piece of flesh colored tissue in her urine with some gross hematuria.  She does report that she has had intermittent gross hematuria over the last 20 years.  There are no aggravating or alleviating factors.  Severity is moderate.  She has never smoked.  She denies carcinogenic exposures.  She has relatively severe asthma with shortness of breath.  She has a CT abdomen/pelvis with contrast from March 2018 that did not show any urologic pathology, aside from some benign-appearing renal cyst.   PMH: Past Medical History:  Diagnosis Date  . Albuminuria   . Arthritis   . Asthma   . Cancer ([place])    skin  . Cataract cortical, senile   . Diabetes mellitus without complication ([place])   . GERD (gastroesophageal reflux disease)   . Hemorrhoids   . History of kidney stones   . Hyperlipidemia   . Hypertension   . Lyme disease   . 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 OSA (obstructive sleep apnea)   . Osteoporosis   . Osteoporosis   . Reflux esophagitis   . Steatohepatitis   . Steatohepatitis     Surgical History: Past Surgical History:  Procedure Laterality Date  . ABDOMINAL  HYSTERECTOMY    . APPENDECTOMY    . [place]  . CARDIAC CATHETERIZATION  08/13/2014   [facility]. no significant CAD, normal LVEDP.   Marland Kitchen CATARACT EXTRACTION    . CHOLECYSTECTOMY    . COLONOSCOPY    . COLONOSCOPY WITH PROPOFOL N/A 12/07/2016   Procedure: COLONOSCOPY WITH PROPOFOL;  Surgeon: Lollie Sails, MD;  Location[facility] ENDOSCOPY;  Service: Endoscopy;  Laterality: N/A;  . ESOPHAGOGASTRODUODENOSCOPY    . ESOPHAGOGASTRODUODENOSCOPY (EGD) WITH PROPOFOL N/A 12/07/2016   Procedure: ESOPHAGOGASTRODUODENOSCOPY (EGD) WITH PROPOFOL;  Surgeon: Lollie Sails, MD;  Location: [facility] ENDOSCOPY;  Service: Endoscopy;  Laterality: N/A;  . ESOPHAGOGASTRODUODENOSCOPY (EGD) WITH PROPOFOL N/A 01/07/2018   Procedure: ESOPHAGOGASTRODUODENOSCOPY (EGD) WITH PROPOFOL;  Surgeon: Lollie Sails, MD;  Location: [facility] ENDOSCOPY;  Service: Endoscopy;  Laterality: N/A;  . EYE SURGERY    . HEMORRHOID SURGERY    . PARTIAL HYSTERECTOMY    . TONSILLECTOMY      Home Medications:  Allergies as of 08/26/2018      Reactions   Ace Inhibitors    Other reaction(s): Unknown   Eggs Or Egg-derived Products Diarrhea   Other    Other reaction(s): Other (See Comments) Eggs   Prednisone    Other reaction(s): Other (See Comments) joint pain   Risedronate    Other reaction(s): Other (See Comments)   Sulfa Antibiotics    Other reaction(s):  Other (See Comments)   Sulfasalazine Other (See Comments)      Medication List        Accurate as of 08/26/18  1:20 PM. Always use your most recent med list.          albuterol 108 (90 Base) MCG/ACT inhaler Commonly known as:  PROVENTIL HFA;VENTOLIN HFA Inhale 1-2 puffs into the lungs every 4 (four) hours as needed for wheezing or shortness of breath.   amLODipine 5 MG tablet Commonly known as:  NORVASC   calcium carbonate 1250 (500 Ca) MG tablet Commonly known as:  OS-CAL - dosed in mg of elemental calcium Take 1 tablet by mouth.   colestipol 1 g  tablet Commonly known as:  COLESTID Take 1 g by mouth 2 (two) times daily.   cyclobenzaprine 10 MG tablet Commonly known as:  FLEXERIL Take 10 mg by mouth.   diltiazem 360 MG 24 hr capsule Commonly known as:  TIAZAC TAKE 1 CAPSULE EVERY DAY   fluticasone 50 MCG/ACT nasal spray Commonly known as:  FLONASE USE 2 SPRAYS IN EACH NOSTRIL DAILY   Fluticasone-Salmeterol 250-50 MCG/DOSE Aepb Commonly known as:  ADVAIR Inhale 1 puff into the lungs 2 (two) times daily.   ADVAIR DISKUS 250-50 MCG/DOSE Aepb Generic drug:  Fluticasone-Salmeterol Inhale into the lungs.   losartan 100 MG tablet Commonly known as:  COZAAR Take 100 mg by mouth daily.   losartan-hydrochlorothiazide 100-25 MG tablet Commonly known as:  HYZAAR Take 1 tablet by mouth daily.   metFORMIN 500 MG tablet Commonly known as:  GLUCOPHAGE Take 500 mg by mouth 2 (two) times daily with a meal.   metoprolol tartrate 50 MG tablet Commonly known as:  LOPRESSOR   montelukast 10 MG tablet Commonly known as:  SINGULAIR Take 10 mg by mouth at bedtime.   pantoprazole 40 MG tablet Commonly known as:  PROTONIX Take 40 mg by mouth daily.   potassium chloride SA 20 MEQ tablet Commonly known as:  K-DUR,KLOR-CON Take 20 mEq by mouth 2 (two) times daily.   rosuvastatin 40 MG tablet Commonly known as:  CRESTOR Take 40 mg by mouth daily.   sertraline 100 MG tablet Commonly known as:  ZOLOFT Take 100 mg by mouth daily.   torsemide 10 MG tablet Commonly known as:  DEMADEX Take by mouth.       Allergies:  Allergies  Allergen Reactions  . Ace Inhibitors     Other reaction(s): Unknown  . Eggs Or Egg-Derived Products Diarrhea  . Other     Other reaction(s): Other (See Comments) Eggs  . Prednisone     Other reaction(s): Other (See Comments) joint pain  . Risedronate     Other reaction(s): Other (See Comments)  . Sulfa Antibiotics     Other reaction(s): Other (See Comments)  . Sulfasalazine Other (See  Comments)    Family History: Family History  Problem Relation Age of Onset  . Breast cancer Mother   . Heart attack Father     Social History:  reports that she has never smoked. She has never used smokeless tobacco. She reports that she does not drink alcohol or use drugs.  ROS: Please see flowsheet from today's date for complete review of systems.  Physical Exam: BP (!) 148/54   Pulse 75   Ht 5' 2"  (1.575 m)   Wt 211 lb (95.7 kg)   BMI 38.59 kg/m    Constitutional:  Alert and oriented, No acute distress.  Obese Cardiovascular: No clubbing, cyanosis,  or edema. Respiratory: Normal respiratory effort, no increased work of breathing. GI: Abdomen is soft, nontender, nondistended, no abdominal masses GU: No CVA tenderness Lymph: No cervical or inguinal lymphadenopathy. Skin: No rashes, bruises or suspicious lesions. Neurologic: Grossly intact, no focal deficits, moving all 4 extremities. Psychiatric: Normal mood and affect.  Laboratory Data:  Urinalysis today 0-5 WBCs, 0 RBCs, moderate bacteria, nitrite negative  Assessment & Plan:   In summary, Ms. Zagami is a 77 year old comorbid female with hematuria and reported history of benign bladder lesion, pathology is unavailable.  She is recently passed some large pieces of tissue from her bladder.  She also has severe stress urinary incontinence.  Cystoscopy to evaluate bladder Referral to pelvic floor physical therapy regarding stress incontinence, consider pessary versus sling in the future  [place], [place] [address], [place], [place] [address] [phone]

## 2018-09-17 ENCOUNTER — Encounter: Payer: Self-pay | Admitting: Urology

## 2018-09-17 ENCOUNTER — Ambulatory Visit (INDEPENDENT_AMBULATORY_CARE_PROVIDER_SITE_OTHER): Payer: Medicare Other | Admitting: Urology

## 2018-09-17 VITALS — BP 168/69 | HR 57 | Wt 207.0 lb

## 2018-09-17 DIAGNOSIS — N329 Bladder disorder, unspecified: Secondary | ICD-10-CM

## 2018-09-17 DIAGNOSIS — R350 Frequency of micturition: Secondary | ICD-10-CM | POA: Diagnosis not present

## 2018-09-17 LAB — MICROSCOPIC EXAMINATION: RBC MICROSCOPIC, UA: NONE SEEN /HPF (ref 0–2)

## 2018-09-17 LAB — URINALYSIS, COMPLETE
BILIRUBIN UA: NEGATIVE
GLUCOSE, UA: NEGATIVE
Ketones, UA: NEGATIVE
NITRITE UA: NEGATIVE
PH UA: 5.5 (ref 5.0–7.5)
RBC UA: NEGATIVE
Specific Gravity, UA: 1.025 (ref 1.005–1.030)
UUROB: 0.2 mg/dL (ref 0.2–1.0)

## 2018-09-17 NOTE — Progress Notes (Signed)
Cystoscopy Procedure Note:  Indication: Reported history of bladder tumor seen in clinic by Dr. Jacqlyn Larsen that then "Disappeared" when she underwent cysto in OR. Reported passing pieces of tissue per urethra recently.  After informed consent and discussion of the procedure and its risks, [name] was positioned and prepped in the standard fashion. Cystoscopy was performed with a flexible cystoscope. The urethra, bladder neck and entire bladder was visualized in a standard fashion. The mucosa was grossly normal throughout. No concerning findings on retroflexion. The ureteral orifices were visualized in their normal location and orientation.   Imaging: CT A/P w/contrast 11/2016 reviewed, no upper tract pathology  Findings: Normal cystoscop  Assessment and Plan: RTC one year with urinalysis  Nickolas Madrid, MD 09/17/2018

## 2018-10-08 ENCOUNTER — Other Ambulatory Visit: Payer: Self-pay

## 2018-10-08 ENCOUNTER — Ambulatory Visit: Payer: Medicare Other | Attending: Urology

## 2018-10-08 DIAGNOSIS — M62838 Other muscle spasm: Secondary | ICD-10-CM | POA: Diagnosis present

## 2018-10-08 DIAGNOSIS — M6281 Muscle weakness (generalized): Secondary | ICD-10-CM | POA: Insufficient documentation

## 2018-10-08 DIAGNOSIS — R293 Abnormal posture: Secondary | ICD-10-CM | POA: Insufficient documentation

## 2018-10-08 DIAGNOSIS — R278 Other lack of coordination: Secondary | ICD-10-CM | POA: Insufficient documentation

## 2018-10-08 NOTE — Therapy (Signed)
[place] MAIN [facility] SERVICES [address] [place], [place], [address] Phone: [phone]   Fax:  [phone]  Physical Therapy Evaluation  Patient Details  Name: [name] MRN: [id number] Date of Birth: 12-Feb-1941 Referring Provider (PT): Karolee Ohs   Encounter Date: 10/08/2018  PT End of Session - 10/11/18 1253    Visit Number  1    Number of Visits  10    Date for PT Re-Evaluation  12/17/18    PT Start Time  1300    PT Stop Time  1400    PT Time Calculation (min)  60 min    Activity Tolerance  Patient tolerated treatment well    Behavior During Therapy  [facility] for tasks assessed/performed       Past Medical History:  Diagnosis Date  . Albuminuria   . Arthritis   . Asthma   . Cancer ([place])    skin  . Cataract cortical, senile   . Diabetes mellitus without complication ([place])   . GERD (gastroesophageal reflux disease)   . Hemorrhoids   . History of kidney stones   . Hyperlipidemia   . Hypertension   . Lyme disease   . 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 OSA (obstructive sleep apnea)   . Osteoporosis   . Osteoporosis   . Reflux esophagitis   . Steatohepatitis   . Steatohepatitis     Past Surgical History:  Procedure Laterality Date  . ABDOMINAL HYSTERECTOMY    . APPENDECTOMY    . [place]  . CARDIAC CATHETERIZATION  08/13/2014   [facility]. no significant CAD, normal LVEDP.   Marland Kitchen CATARACT EXTRACTION    . CHOLECYSTECTOMY    . COLONOSCOPY    . COLONOSCOPY WITH PROPOFOL N/A 12/07/2016   Procedure: COLONOSCOPY WITH PROPOFOL;  Surgeon: Lollie Sails, MD;  Location: [facility] ENDOSCOPY;  Service: Endoscopy;  Laterality: N/A;  . ESOPHAGOGASTRODUODENOSCOPY    . ESOPHAGOGASTRODUODENOSCOPY (EGD) WITH PROPOFOL N/A 12/07/2016   Procedure: ESOPHAGOGASTRODUODENOSCOPY (EGD) WITH PROPOFOL;  Surgeon: Lollie Sails, MD;  Location: [facility] ENDOSCOPY;  Service: Endoscopy;  Laterality: N/A;  . ESOPHAGOGASTRODUODENOSCOPY (EGD) WITH  PROPOFOL N/A 01/07/2018   Procedure: ESOPHAGOGASTRODUODENOSCOPY (EGD) WITH PROPOFOL;  Surgeon: Lollie Sails, MD;  Location: [facility] ENDOSCOPY;  Service: Endoscopy;  Laterality: N/A;  . EYE SURGERY    . HEMORRHOID SURGERY    . PARTIAL HYSTERECTOMY    . TONSILLECTOMY      There were no vitals filed for this visit.     Pelvic Floor Physical Therapy Evaluation and Assessment  SCREENING  Falls in last 6 mo: yes, 1  Patient's communication preference: Phone or email, voicemail ok  Red Flags:  Have you had any night sweats? Yes, over last month. Unexplained weight loss? no Saddle anesthesia? none Unexplained changes in bowel or bladder habits?   SUBJECTIVE  Patient reports: She has had loose stools ever since her last colonoscopy. Is using imodium to control her diarrhea because whatever she was prescribed is not helping. Had to drink the solution with sulfur in it leading up to her colonoscopy and that started the issue.   Low back pain only one day recently when on her feet for a long time.  Precautions:  Osteoporosis  Social/Family/Vocational History:  Retired, lives with her husband  Recent Procedures/Tests/Findings:  Sleep Apnea, hemorrhoids, asthma, osteoporosis, partial hysterectomy from fibroids and cervical lesions, Appendectomy.  Obstetrical History: 2 vaginal deliveries, had tearing with at least the  first.  Gynecological History: Abdominal Hysterectomy from fibroids and cervical dysplasia.  Urinary History: Having SUI but also has leakage every time she stands from sitting. Can have leakage when walking. Wearing #6 pads and depends at night.  Gastrointestinal History: Has loose bowels beginning in 2017 and can have difficulty keeping it in if it is loose.  Sexual activity/pain: No activity, no h/o pain  Location of pain: low back Current pain:  0/10  Max pain:  8/10 Least pain:  0/10 Nature of pain: achy  Patient Goals: She would like to be able  to return to water aerobics without fear of leakage of stool or urine.   OBJECTIVE  Posture/Observations:  Sitting:  Standing: R hip slightly ER, slight bend in her knees, posterior pelvic tilt.   Palpation/Segmental Motion/Joint Play: Deferred to follow-up visit.  Special tests:  Deferred to follow-up visit.   Range of Motion/Flexibilty: Deferred to follow-up visit.Spine: Hips:   Strength/MMT: Deferred to follow-up visit. LE MMT  LE MMT Left Right  Hip flex:  (L2) /5 /5  Hip ext: /5 /5  Hip abd: /5 /5  Hip add: /5 /5  Hip IR /5 /5  Hip ER /5 /5     Abdominal: Deferred to follow-up visit. Palpation: Diastasis:   Pelvic Floor External Exam: Introitus Appears: gaping Skin integrity: erythema Palpation: TTP to B STP and L>R bulbo Cough: no motion Prolapse visible?: no Scar mobility: decreased mobility, tight and thin band and appearance of significant episiotomy/tearing   Internal Vaginal Exam: Strength (PERF): 1,0,0 Symmetry: N/A Palpation: TTP throughout, worse posteriorly Prolapse: Anterior wall visible ~ 1/2 cm above the level of the introitus.   Internal Rectal Exam: deferred until deemed necessary. Strength (PERF): Symmetry: Palpation: Prolapse:   Gait Analysis:   Pelvic Floor Outcome Measures: CRADI-8: 50/100, POPDI-6: 50/100  INTERVENTIONS THIS SESSION: Self-care: Educated on the structure and function of the pelvic floor in relation to their symptoms as well as the POC, and initial HEP in order to set patient expectations and understanding from which we will build on in the future sessions. Educated on performance of and benefits of diaphragmatic breathing to allow for improved success with interventions on upcoming sessions.   Total time: 60 min.    [facility] PT Assessment - 10/11/18 0001      Assessment   Medical Diagnosis  Urinary Frequency    Referring Provider (PT)  Karolee Ohs    Onset Date/Surgical Date  10/08/16    Next MD Visit   --   March 2020   Prior Therapy  none      Precautions   Precautions  None      Restrictions   Weight Bearing Restrictions  No      Balance Screen   Has the patient fallen in the past 6 months  Yes    How many times?  1   bruised knees fell up steps     [place] residence    Living Arrangements  Spouse/significant other    Available Help at Discharge  Family   husband has memory problems   Type of [place] to enter    Entrance Stairs-Number of Steps  1    Entrance Stairs-Rails  None    Home Layout  One level    [place]  None      Prior Function   Level of Independence  Independent  Vocation  Retired    Publishing rights manager, sewing, Agricultural consultant   Overall Cognitive Status  Within Functional Limits for tasks assessed                Objective measurements completed on examination: See above findings.              PT Education - 10/11/18 1253    Education Details  See Pt. Instructions and Interventions this session.    Person(s) Educated  Patient    Methods  Explanation;Demonstration;Tactile cues;Verbal cues;Handout    Comprehension  Tactile cues required;Verbalized understanding;Returned demonstration;Verbal cues required;Need further instruction       PT Short Term Goals - 10/08/18 1445      PT SHORT TERM GOAL #1   Title  Patient will demonstrate a coordinated contraction, relaxation, and bulge of the pelvic floor muscles to demonstrate functional recruitment and motion and allow for further strengthening.    Baseline  Pt. has little to no coordination or activation of the PFM with cueing.    Time  5    Period  Weeks    Status  New    Target Date  11/12/18      PT SHORT TERM GOAL #2   Title  Patient will demonstrate coordinated diaphragmatic breathing with pelvic tilts to demonstrate improved control of diaphragm and TA, to allow for further  strengthening of core musculature and decreased pelvic floor spasm    Baseline  Pt. has mild prolapse of anterior vaginal wall and feels "bulge" occasionally, demonstrates breathing dysfunction.     Time  5    Period  Weeks    Status  New    Target Date  11/12/18      PT SHORT TERM GOAL #3   Title  Patient will demonstrate improved sitting and standing posture to demonstrate learning and decrease stress on the pelvic floor with functional activity.    Baseline  Posterior pelvic tilt, knees bent, buttock gripping    Time  5    Period  Weeks    Status  New    Target Date  11/12/18        PT Long Term Goals - 10/08/18 1449      PT LONG TERM GOAL #1   Title  Patient will report no episodes of SUI over the course of the prior two weeks to demonstrate improved functional ability.    Baseline  Pt. wearing depends at night and #6 pads during the day, having leakage frequently with urge upon standing.    Time  10    Period  Weeks    Status  New    Target Date  12/17/18      PT LONG TERM GOAL #2   Title  Patient will score at or below 25/100 on the POPDI-6 and the CRADI-8 to demonstrate a clinically meaningful decrease in disability and distress due to pelvic floor dysfunction.    Baseline  CRADI-8: 50/100, POPDI-6: 50/100    Time  10    Period  Weeks    Status  New    Target Date  12/20/18      PT LONG TERM GOAL #3   Title  Patient will report urinating 6-8 times per day over the course of the prior week to demonstrate decreased frequency.    Baseline  Has to urinate immediately upon standing after sitting for any length of time.    Time  10  Period  Weeks    Status  New    Target Date  12/17/18      PT LONG TERM GOAL #4   Title  Patient will report no episodes of Fecal Incontinence over the past two weeks to demonstrate improved function and quality of life and to allow her to participate in water aerobics as part of her occupation.    Baseline  She has had to stop going to  water aerobics due to incotinence.    Time  10    Period  Weeks    Status  New    Target Date  12/17/18             Plan - 10/11/18 1251    Clinical Impression Statement  Pt. is a 78 y/o female who presents today with cheif c/o mixed urinary and fecal incontinence that has become problematic over the past 2-3 years following a colonoscopy in 2017. Her relevant PMH includes Sleep Apnea, hemorrhoids, asthma, osteoporosis, partial hysterectomy from fibroids and cervical lesions, as well as an appendectomy. Her clinical exam reveals posterior pelvic tilt, breathing dysfunction, and poor coordination as well as spasm and weakness of the pelvic floor muscles. She will benefit from skilled pelvic health PT to address the noted defecits and to continue to assess for and address other potential causes of incontinence.     History and Personal Factors relevant to plan of care:  Sleep Apnea, hemorrhoids, asthma, osteoporosis, partial hysterectomy from fibroids and cervical lesions, as well as an appendectomy, overweight    Clinical Presentation  Evolving    Clinical Presentation due to:  Pt. has been having night sweats for ~ 2 months.    Clinical Decision Making  High    Rehab Potential  Fair    Clinical Impairments Affecting Rehab Potential  Sleep Apnea, hemorrhoids, asthma, osteoporosis, partial hysterectomy from fibroids and cervical lesions, obesity, as well as an appendectomy    PT Frequency  2x / week    PT Duration  Other (comment)   10 weeks   PT Treatment/Interventions  ADLs/Self Care Home Management;Biofeedback;Electrical Stimulation;Moist Heat;Traction;Gait training;Functional mobility training;Therapeutic activities;Therapeutic exercise;Balance training;Neuromuscular re-education;Manual techniques;Patient/family education;Scar mobilization;Passive range of motion;Dry needling;Taping;Spinal Manipulations;Joint Manipulations    PT Next Visit Plan  test hip strength and assess muscles for  spasms in LB and hip-flexors. Review and practice breathing and add pelvic tilt on a ball with mirror. eventually use biofeedback, teach urge-suppression    PT Home Exercise Plan  diaphragmatic breathing    Consulted and Agree with Plan of Care  Patient       Patient will benefit from skilled therapeutic intervention in order to improve the following deficits and impairments:  Abnormal gait, Increased fascial restricitons, Impaired sensation, Improper body mechanics, Cardiopulmonary status limiting activity, Decreased coordination, Decreased mobility, Decreased scar mobility, Increased muscle spasms, Impaired tone, Postural dysfunction, Decreased endurance, Decreased range of motion, Decreased strength, Decreased balance, Difficulty walking, Obesity, Impaired flexibility  Visit Diagnosis: Other lack of coordination  Other muscle spasm  Abnormal posture  Muscle weakness (generalized)     Problem List Patient Active Problem List   Diagnosis Date Noted  . Diabetes mellitus type 2 in obese ([place]) 09/04/2017  . Bilateral lower extremity edema 09/04/2017  . Gastroesophageal reflux disease without esophagitis 05/16/2017  . Diarrhea 12/29/2016  . Enteritis 12/29/2016  . Acute lower UTI 12/29/2016  . Incomplete emptying of bladder 11/01/2016  . Urge incontinence 07/14/2015  . Chronic renal failure, stage 3 (moderate) ([place]) 08/06/2014  .  Renal mass, right 08/06/2014  . Precordial pain 07/24/2014  . Dyspnea 07/24/2014  . Palpitations 07/24/2014  . Essential hypertension 07/24/2014  . Hyperlipidemia 07/24/2014  . Bladder neoplasm of uncertain malignant potential 07/16/2014  . Gross hematuria 07/16/2014  . Asthma 05/17/2014  . Osteoporosis 05/17/2014  . Steatohepatitis 05/17/2014  . OSA on CPAP 05/17/2014   Willa Rough DPT, ATC Willa Rough 10/11/2018, 12:55 PM  [place] MAIN [facility] SERVICES [address] [place], [place],  [address] Phone: [phone]   Fax:  [phone]  Name: [name] MRN: [id number] Date of Birth: [birth year]

## 2018-10-08 NOTE — Patient Instructions (Signed)
Stabilization: Diaphragmatic Breathing    Lie with knees bent, feet flat. Place one hand on stomach, other on chest. Breathe deeply through nose, lifting belly hand without any motion of hand on chest.  Practice this with a book that weighs ~ 2lbs. On your tummy, see it rise as you breathe in, and fall as you breathe out. Do this for 5 min. Per night and as much throughout the day as you can/want.

## 2018-10-10 ENCOUNTER — Ambulatory Visit: Payer: Medicare Other

## 2018-10-10 DIAGNOSIS — R293 Abnormal posture: Secondary | ICD-10-CM

## 2018-10-10 DIAGNOSIS — R278 Other lack of coordination: Secondary | ICD-10-CM | POA: Diagnosis not present

## 2018-10-10 DIAGNOSIS — M62838 Other muscle spasm: Secondary | ICD-10-CM

## 2018-10-10 DIAGNOSIS — M6281 Muscle weakness (generalized): Secondary | ICD-10-CM

## 2018-10-10 NOTE — Therapy (Signed)
[place] MAIN [facility] SERVICES [address] [place], [place], [address] Phone: [phone]   Fax:  [phone]  Physical Therapy Treatment  Patient Details  Name: [name] MRN: [id number] Date of Birth: 10/30/1940 Referring Provider (PT): Karolee Ohs   Encounter Date: 10/10/2018  PT End of Session - 10/15/18 1114    Visit Number  2    Number of Visits  10    Date for PT Re-Evaluation  12/17/18    PT Start Time  1000    PT Stop Time  1100    PT Time Calculation (min)  60 min    Activity Tolerance  Patient tolerated treatment well    Behavior During Therapy  [facility] for tasks assessed/performed       Past Medical History:  Diagnosis Date  . Albuminuria   . Arthritis   . Asthma   . Cancer ([place])    skin  . Cataract cortical, senile   . Diabetes mellitus without complication ([place])   . GERD (gastroesophageal reflux disease)   . Hemorrhoids   . History of kidney stones   . Hyperlipidemia   . Hypertension   . Lyme disease   . 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 OSA (obstructive sleep apnea)   . Osteoporosis   . Osteoporosis   . Reflux esophagitis   . Steatohepatitis   . Steatohepatitis     Past Surgical History:  Procedure Laterality Date  . ABDOMINAL HYSTERECTOMY    . APPENDECTOMY    . [place]  . CARDIAC CATHETERIZATION  08/13/2014   [facility]. no significant CAD, normal LVEDP.   Marland Kitchen CATARACT EXTRACTION    . CHOLECYSTECTOMY    . COLONOSCOPY    . COLONOSCOPY WITH PROPOFOL N/A 12/07/2016   Procedure: COLONOSCOPY WITH PROPOFOL;  Surgeon: Lollie Sails, MD;  Location: [facility] ENDOSCOPY;  Service: Endoscopy;  Laterality: N/A;  . ESOPHAGOGASTRODUODENOSCOPY    . ESOPHAGOGASTRODUODENOSCOPY (EGD) WITH PROPOFOL N/A 12/07/2016   Procedure: ESOPHAGOGASTRODUODENOSCOPY (EGD) WITH PROPOFOL;  Surgeon: Lollie Sails, MD;  Location: [facility] ENDOSCOPY;  Service: Endoscopy;  Laterality: N/A;  . ESOPHAGOGASTRODUODENOSCOPY (EGD) WITH  PROPOFOL N/A 01/07/2018   Procedure: ESOPHAGOGASTRODUODENOSCOPY (EGD) WITH PROPOFOL;  Surgeon: Lollie Sails, MD;  Location: [facility] ENDOSCOPY;  Service: Endoscopy;  Laterality: N/A;  . EYE SURGERY    . HEMORRHOID SURGERY    . PARTIAL HYSTERECTOMY    . TONSILLECTOMY      There were no vitals filed for this visit.    Pelvic Floor Physical Therapy Treatment Note  SCREENING  Changes in medications, allergies, or medical history?: no    SUBJECTIVE  Patient reports: She has been really practicing her breathing.  Precautions:  Osteoporosis  Pain update: Pt. Made no c/o pain.  Patient Goals: She would like to be able to return to water aerobics without fear of leakage of stool or urine.   OBJECTIVE  Changes in:  Pelvic floor: Pt. Was unable to actively contract PFM with or without pelvic tilt and breathing pre-treatment but was able to demonstrate 3 sequential repetitions of short-holds in a row with exhale and posterior pelvic tilt coordinated following treatment. Max strength reading was 10m.   Abdominal:  Pt. Is able to actively engage TA with moderate tactile and verbal cueing but has dominant obliques and is not consistent with performance (~40% without cue, ~ 65% with)   INTERVENTIONS THIS SESSION: NM re-ed: Educated on and practiced getting coordination of pelvic  tilt/TA contraction, breathing, and kegel at the same time with maximal tactile, visual, and verbal feedback to allow for improved awareness and function of the PFM to decrease incontinence.  Total time: 60 min.                          PT Education - 10/15/18 1113    Education Details  See Pt. Instructions and Interventions this session    Person(s) Educated  Patient    Methods  Explanation;Demonstration;Tactile cues;Verbal cues;Handout    Comprehension  Verbalized understanding;Returned demonstration;Verbal cues required;Tactile cues required;Need further instruction        PT Short Term Goals - 10/08/18 1445      PT SHORT TERM GOAL #1   Title  Patient will demonstrate a coordinated contraction, relaxation, and bulge of the pelvic floor muscles to demonstrate functional recruitment and motion and allow for further strengthening.    Baseline  Pt. has little to no coordination or activation of the PFM with cueing.    Time  5    Period  Weeks    Status  New    Target Date  11/12/18      PT SHORT TERM GOAL #2   Title  Patient will demonstrate coordinated diaphragmatic breathing with pelvic tilts to demonstrate improved control of diaphragm and TA, to allow for further strengthening of core musculature and decreased pelvic floor spasm    Baseline  Pt. has mild prolapse of anterior vaginal wall and feels "bulge" occasionally, demonstrates breathing dysfunction.     Time  5    Period  Weeks    Status  New    Target Date  11/12/18      PT SHORT TERM GOAL #3   Title  Patient will demonstrate improved sitting and standing posture to demonstrate learning and decrease stress on the pelvic floor with functional activity.    Baseline  Posterior pelvic tilt, knees bent, buttock gripping    Time  5    Period  Weeks    Status  New    Target Date  11/12/18        PT Long Term Goals - 10/08/18 1449      PT LONG TERM GOAL #1   Title  Patient will report no episodes of SUI over the course of the prior two weeks to demonstrate improved functional ability.    Baseline  Pt. wearing depends at night and #6 pads during the day, having leakage frequently with urge upon standing.    Time  10    Period  Weeks    Status  New    Target Date  12/17/18      PT LONG TERM GOAL #2   Title  Patient will score at or below 25/100 on the POPDI-6 and the CRADI-8 to demonstrate a clinically meaningful decrease in disability and distress due to pelvic floor dysfunction.    Baseline  CRADI-8: 50/100, POPDI-6: 50/100    Time  10    Period  Weeks    Status  New    Target Date   12/20/18      PT LONG TERM GOAL #3   Title  Patient will report urinating 6-8 times per day over the course of the prior week to demonstrate decreased frequency.    Baseline  Has to urinate immediately upon standing after sitting for any length of time.    Time  10    Period  Weeks  Status  New    Target Date  12/17/18      PT LONG TERM GOAL #4   Title  Patient will report no episodes of Fecal Incontinence over the past two weeks to demonstrate improved function and quality of life and to allow her to participate in water aerobics as part of her occupation.    Baseline  She has had to stop going to water aerobics due to incotinence.    Time  10    Period  Weeks    Status  New    Target Date  12/17/18            Plan - 10/15/18 1115    Clinical Impression Statement  Pt. responded well to all interventions today, demonstrating significant improvement in body awareness and coordination and recruitmant of PFM, diaphragm, and TA. Continue per POC.     Clinical Presentation  Evolving    Clinical Decision Making  High    Rehab Potential  Fair    Clinical Impairments Affecting Rehab Potential  Sleep Apnea, hemorrhoids, asthma, osteoporosis, partial hysterectomy from fibroids and cervical lesions, obesity, as well as an appendectomy    PT Frequency  2x / week    PT Duration  Other (comment)    PT Treatment/Interventions  ADLs/Self Care Home Management;Biofeedback;Electrical Stimulation;Moist Heat;Traction;Gait training;Functional mobility training;Therapeutic activities;Therapeutic exercise;Balance training;Neuromuscular re-education;Manual techniques;Patient/family education;Scar mobilization;Passive range of motion;Dry needling;Taping;Spinal Manipulations;Joint Manipulations    PT Next Visit Plan  test hip strength and assess muscles for spasms in LB and hip-flexors. Review and practice breathing and add pelvic tilt on a ball with mirror. eventually use biofeedback, teach  urge-suppression    PT Home Exercise Plan  diaphragmatic breathing, posterior pelvic tilts with hips elevated and kegel.    Consulted and Agree with Plan of Care  Patient       Patient will benefit from skilled therapeutic intervention in order to improve the following deficits and impairments:  Abnormal gait, Increased fascial restricitons, Impaired sensation, Improper body mechanics, Cardiopulmonary status limiting activity, Decreased coordination, Decreased mobility, Decreased scar mobility, Increased muscle spasms, Impaired tone, Postural dysfunction, Decreased endurance, Decreased range of motion, Decreased strength, Decreased balance, Difficulty walking, Obesity, Impaired flexibility  Visit Diagnosis: Other lack of coordination  Other muscle spasm  Abnormal posture  Muscle weakness (generalized)     Problem List Patient Active Problem List   Diagnosis Date Noted  . Diabetes mellitus type 2 in obese ([place]) 09/04/2017  . Bilateral lower extremity edema 09/04/2017  . Gastroesophageal reflux disease without esophagitis 05/16/2017  . Diarrhea 12/29/2016  . Enteritis 12/29/2016  . Acute lower UTI 12/29/2016  . Incomplete emptying of bladder 11/01/2016  . Urge incontinence 07/14/2015  . Chronic renal failure, stage 3 (moderate) ([place]) 08/06/2014  . Renal mass, right 08/06/2014  . Precordial pain 07/24/2014  . Dyspnea 07/24/2014  . Palpitations 07/24/2014  . Essential hypertension 07/24/2014  . Hyperlipidemia 07/24/2014  . Bladder neoplasm of uncertain malignant potential 07/16/2014  . Gross hematuria 07/16/2014  . Asthma 05/17/2014  . Osteoporosis 05/17/2014  . Steatohepatitis 05/17/2014  . OSA on CPAP 05/17/2014   Willa Rough DPT, ATC Willa Rough 10/15/2018, 11:23 AM  [place] MAIN [facility] SERVICES [address] [place], [place], [address] Phone: [phone]   Fax:  [phone]  Name: [name] MRN: [id number] Date  of Birth: [birth year]

## 2018-10-10 NOTE — Patient Instructions (Addendum)
Pelvic Tilt With Pelvic Floor (Hook-Lying)        Lie with hips and knees bent. Squeeze pelvic floor and flatten low back while breathing out so that pelvis tilts. Repeat _10x3__ times. Do __2_ times a day.  Put a pillow under your hips in this position when doing this exercise to help decrease pressure in the pelvis when it feels "heavy".

## 2018-10-15 ENCOUNTER — Ambulatory Visit: Payer: Medicare Other

## 2018-10-15 DIAGNOSIS — R293 Abnormal posture: Secondary | ICD-10-CM

## 2018-10-15 DIAGNOSIS — R278 Other lack of coordination: Secondary | ICD-10-CM

## 2018-10-15 DIAGNOSIS — M62838 Other muscle spasm: Secondary | ICD-10-CM

## 2018-10-15 DIAGNOSIS — M6281 Muscle weakness (generalized): Secondary | ICD-10-CM

## 2018-10-15 NOTE — Patient Instructions (Signed)
Pelvic Tilt With Pelvic Floor (Hook-Lying)        Lie with hips and knees bent. Squeeze pelvic floor and flatten low back while breathing out so that pelvis tilts. Repeat _10x2__ times. Do _3__ times a day.  Put a pillow under your hips in this position when doing this exercise to help decrease pressure in the pelvis when it feels "heavy".

## 2018-10-15 NOTE — Therapy (Signed)
[place] MAIN [facility] SERVICES [address] [place], [place], [address] Phone: [phone]   Fax:  [phone]  Physical Therapy Treatment  Patient Details  Name: [name] MRN: [id number] Date of Birth: 09-Dec-1940 Referring Provider (PT): Karolee Ohs   Encounter Date: 10/15/2018  PT End of Session - 10/15/18 1124    Visit Number  3    Number of Visits  10    Date for PT Re-Evaluation  12/17/18    PT Start Time  1030    PT Stop Time  1130    PT Time Calculation (min)  60 min    Activity Tolerance  Patient tolerated treatment well    Behavior During Therapy  [facility] for tasks assessed/performed       Past Medical History:  Diagnosis Date  . Albuminuria   . Arthritis   . Asthma   . Cancer ([place])    skin  . Cataract cortical, senile   . Diabetes mellitus without complication ([place])   . GERD (gastroesophageal reflux disease)   . Hemorrhoids   . History of kidney stones   . Hyperlipidemia   . Hypertension   . Lyme disease   . 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 OSA (obstructive sleep apnea)   . Osteoporosis   . Osteoporosis   . Reflux esophagitis   . Steatohepatitis   . Steatohepatitis     Past Surgical History:  Procedure Laterality Date  . ABDOMINAL HYSTERECTOMY    . APPENDECTOMY    . [place]  . CARDIAC CATHETERIZATION  08/13/2014   [facility]. no significant CAD, normal LVEDP.   Marland Kitchen CATARACT EXTRACTION    . CHOLECYSTECTOMY    . COLONOSCOPY    . COLONOSCOPY WITH PROPOFOL N/A 12/07/2016   Procedure: COLONOSCOPY WITH PROPOFOL;  Surgeon: Lollie Sails, MD;  Location: [facility] ENDOSCOPY;  Service: Endoscopy;  Laterality: N/A;  . ESOPHAGOGASTRODUODENOSCOPY    . ESOPHAGOGASTRODUODENOSCOPY (EGD) WITH PROPOFOL N/A 12/07/2016   Procedure: ESOPHAGOGASTRODUODENOSCOPY (EGD) WITH PROPOFOL;  Surgeon: Lollie Sails, MD;  Location: [facility] ENDOSCOPY;  Service: Endoscopy;  Laterality: N/A;  . ESOPHAGOGASTRODUODENOSCOPY (EGD) WITH  PROPOFOL N/A 01/07/2018   Procedure: ESOPHAGOGASTRODUODENOSCOPY (EGD) WITH PROPOFOL;  Surgeon: Lollie Sails, MD;  Location: [facility] ENDOSCOPY;  Service: Endoscopy;  Laterality: N/A;  . EYE SURGERY    . HEMORRHOID SURGERY    . PARTIAL HYSTERECTOMY    . TONSILLECTOMY      There were no vitals filed for this visit.    Pelvic Floor Physical Therapy Treatment Note  SCREENING  Changes in medications, allergies, or medical history?: none    SUBJECTIVE  Patient reports: She has been able to decrease from wearing 12 pads throughout the day to only 1 and is not having diarrhea! She has been really practicing her exercises but knows she really has to focus.  Precautions:  Osteoporosis  Pain update: Pt. Makes no c/o pain.  Patient Goals: She would like to be able to return to water aerobics without fear of leakage of stool or urine.   OBJECTIVE  Changes in: Posture/Observations:  Pt. Demonstrates good carry-over of awareness and coordination of breathing/contraction from prior session.  Pelvic floor: Following treatment, Pt. Demonstrated a 2/5 with primarily first-layer contraction but after coaching and exercise training, bladder was able to shift into a higher position and Pt. Could achieve a 2+/5 with some greater second-layer recruitment.  INTERVENTIONS THIS SESSION: NM re-ed: Educated on how to feel  the muscles of the PFM and decrease co-contraction of glutes and adductors while utilizing diaphragm and TA engagement appropriately. Educated on down-regulation as well to allow Pt. Increased proprioception with squeeze. Therex: Pt. Educated on and practiced posterior pelvic tilt with kegel and hips elevated to continue to improve PFM coordination, strength, and ROM for decreased incontinence. Bio-feedback: Pt. Instructed on and used biofeedback as visual feedback along with tactile and verbal feedback from self and therapist to improve her coordination and awareness of her PFM for  further strengthening and decreased incontinence.  Total time: 60 min.                         PT Education - 10/15/18 1124    Education Details  See Pt. Instructions and Interventions this session.    Person(s) Educated  Patient    Methods  Explanation;Demonstration;Tactile cues;Verbal cues;Handout    Comprehension  Verbalized understanding;Returned demonstration;Verbal cues required;Tactile cues required;Need further instruction       PT Short Term Goals - 10/08/18 1445      PT SHORT TERM GOAL #1   Title  Patient will demonstrate a coordinated contraction, relaxation, and bulge of the pelvic floor muscles to demonstrate functional recruitment and motion and allow for further strengthening.    Baseline  Pt. has little to no coordination or activation of the PFM with cueing.    Time  5    Period  Weeks    Status  New    Target Date  11/12/18      PT SHORT TERM GOAL #2   Title  Patient will demonstrate coordinated diaphragmatic breathing with pelvic tilts to demonstrate improved control of diaphragm and TA, to allow for further strengthening of core musculature and decreased pelvic floor spasm    Baseline  Pt. has mild prolapse of anterior vaginal wall and feels "bulge" occasionally, demonstrates breathing dysfunction.     Time  5    Period  Weeks    Status  New    Target Date  11/12/18      PT SHORT TERM GOAL #3   Title  Patient will demonstrate improved sitting and standing posture to demonstrate learning and decrease stress on the pelvic floor with functional activity.    Baseline  Posterior pelvic tilt, knees bent, buttock gripping    Time  5    Period  Weeks    Status  New    Target Date  11/12/18        PT Long Term Goals - 10/08/18 1449      PT LONG TERM GOAL #1   Title  Patient will report no episodes of SUI over the course of the prior two weeks to demonstrate improved functional ability.    Baseline  Pt. wearing depends at night and #6 pads  during the day, having leakage frequently with urge upon standing.    Time  10    Period  Weeks    Status  New    Target Date  12/17/18      PT LONG TERM GOAL #2   Title  Patient will score at or below 25/100 on the POPDI-6 and the CRADI-8 to demonstrate a clinically meaningful decrease in disability and distress due to pelvic floor dysfunction.    Baseline  CRADI-8: 50/100, POPDI-6: 50/100    Time  10    Period  Weeks    Status  New    Target Date  12/20/18  PT LONG TERM GOAL #3   Title  Patient will report urinating 6-8 times per day over the course of the prior week to demonstrate decreased frequency.    Baseline  Has to urinate immediately upon standing after sitting for any length of time.    Time  10    Period  Weeks    Status  New    Target Date  12/17/18      PT LONG TERM GOAL #4   Title  Patient will report no episodes of Fecal Incontinence over the past two weeks to demonstrate improved function and quality of life and to allow her to participate in water aerobics as part of her occupation.    Baseline  She has had to stop going to water aerobics due to incotinence.    Time  10    Period  Weeks    Status  New    Target Date  12/17/18            Plan - 10/15/18 1125    Clinical Impression Statement  Pt. has had dramatic improveent in her symptoms already, demonstrating a decrease from up to 12 pads per day to only 1 over 24 hours. She is able to recognize when she performs exercises correctly or not, though she does require significant focus and visual as well as tactile feedback to get consistent performance of exercise. She was able to use her own hands to provide tactile feedback for correct performance today. Following treatment, she demonstrated a 2/5 contraction, with more first than second layer contraction. Continue per POC.    Clinical Presentation  Evolving    Clinical Decision Making  High    Rehab Potential  Fair    Clinical Impairments Affecting  Rehab Potential  Sleep Apnea, hemorrhoids, asthma, osteoporosis, partial hysterectomy from fibroids and cervical lesions, obesity, as well as an appendectomy    PT Frequency  2x / week    PT Duration  Other (comment)    PT Treatment/Interventions  ADLs/Self Care Home Management;Biofeedback;Electrical Stimulation;Moist Heat;Traction;Gait training;Functional mobility training;Therapeutic activities;Therapeutic exercise;Balance training;Neuromuscular re-education;Manual techniques;Patient/family education;Scar mobilization;Passive range of motion;Dry needling;Taping;Spinal Manipulations;Joint Manipulations    PT Next Visit Plan  test hip strength and assess muscles for spasms in LB and hip-flexors. Review and practice breathing and add pelvic tilt on a ball with mirror. eventually use biofeedback, teach urge-suppression    PT Home Exercise Plan  diaphragmatic breathing, posterior pelvic tilts with hips elevated and kegel.    Consulted and Agree with Plan of Care  Patient       Patient will benefit from skilled therapeutic intervention in order to improve the following deficits and impairments:  Abnormal gait, Increased fascial restricitons, Impaired sensation, Improper body mechanics, Cardiopulmonary status limiting activity, Decreased coordination, Decreased mobility, Decreased scar mobility, Increased muscle spasms, Impaired tone, Postural dysfunction, Decreased endurance, Decreased range of motion, Decreased strength, Decreased balance, Difficulty walking, Obesity, Impaired flexibility  Visit Diagnosis: Other lack of coordination  Other muscle spasm  Abnormal posture  Muscle weakness (generalized)     Problem List Patient Active Problem List   Diagnosis Date Noted  . Diabetes mellitus type 2 in obese ([place]) 09/04/2017  . Bilateral lower extremity edema 09/04/2017  . Gastroesophageal reflux disease without esophagitis 05/16/2017  . Diarrhea 12/29/2016  . Enteritis 12/29/2016  . Acute  lower UTI 12/29/2016  . Incomplete emptying of bladder 11/01/2016  . Urge incontinence 07/14/2015  . Chronic renal failure, stage 3 (moderate) ([place]) 08/06/2014  . Renal  mass, right 08/06/2014  . Precordial pain 07/24/2014  . Dyspnea 07/24/2014  . Palpitations 07/24/2014  . Essential hypertension 07/24/2014  . Hyperlipidemia 07/24/2014  . Bladder neoplasm of uncertain malignant potential 07/16/2014  . Gross hematuria 07/16/2014  . Asthma 05/17/2014  . Osteoporosis 05/17/2014  . Steatohepatitis 05/17/2014  . OSA on CPAP 05/17/2014   Willa Rough DPT, ATC Willa Rough 10/15/2018, 11:57 AM  [place] MAIN [facility] SERVICES [address] [place], [place], [address] Phone: [phone]   Fax:  [phone]  Name: [name] MRN: [id number] Date of Birth: 10/29/1940

## 2018-10-17 ENCOUNTER — Ambulatory Visit: Payer: Medicare Other

## 2018-10-17 DIAGNOSIS — M62838 Other muscle spasm: Secondary | ICD-10-CM

## 2018-10-17 DIAGNOSIS — R278 Other lack of coordination: Secondary | ICD-10-CM

## 2018-10-17 DIAGNOSIS — R293 Abnormal posture: Secondary | ICD-10-CM

## 2018-10-17 DIAGNOSIS — M6281 Muscle weakness (generalized): Secondary | ICD-10-CM

## 2018-10-17 NOTE — Patient Instructions (Signed)
    Sit, feet flat, scoot forward to the edge of the chair. Inhale as you bend forward at hips, begin to exhale just before and while you stand, contracting the glutes, lower tummy muscles and pelvic floor as if stopping urination as you stand up.   * Do this every time you sit or stand! If you catch yourself doing it "wrong, re-set and do it again so it can become habit!

## 2018-10-17 NOTE — Therapy (Signed)
[place] MAIN [facility] SERVICES [address] [place], [place], [address] Phone: [phone]   Fax:  [phone]  Physical Therapy Treatment  Patient Details  Name: [name] MRN: [id number] Date of Birth: Feb 23, 1941 Referring Provider (PT): Karolee Ohs   Encounter Date: 10/17/2018  PT End of Session - 10/17/18 1113    Visit Number  4    Number of Visits  20    Date for PT Re-Evaluation  12/17/18    Authorization Type  Progress Note due on 10th visit (2-11)    Authorization - Visit Number  4    Authorization - Number of Visits  10    PT Start Time  1000    PT Stop Time  1050    PT Time Calculation (min)  50 min    Equipment Utilized During Treatment  Gait belt    Activity Tolerance  Patient tolerated treatment well;Treatment limited secondary to medical complications (Comment)   Pt. required extra time to recover between exercises   Behavior During Therapy  [facility] for tasks assessed/performed       Past Medical History:  Diagnosis Date  . Albuminuria   . Arthritis   . Asthma   . Cancer ([place])    skin  . Cataract cortical, senile   . Diabetes mellitus without complication ([place])   . GERD (gastroesophageal reflux disease)   . Hemorrhoids   . History of kidney stones   . Hyperlipidemia   . Hypertension   . Lyme disease   . 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 OSA (obstructive sleep apnea)   . Osteoporosis   . Osteoporosis   . Reflux esophagitis   . Steatohepatitis   . Steatohepatitis     Past Surgical History:  Procedure Laterality Date  . ABDOMINAL HYSTERECTOMY    . APPENDECTOMY    . [place]  . CARDIAC CATHETERIZATION  08/13/2014   [facility]. no significant CAD, normal LVEDP.   Marland Kitchen CATARACT EXTRACTION    . CHOLECYSTECTOMY    . COLONOSCOPY    . COLONOSCOPY WITH PROPOFOL N/A 12/07/2016   Procedure: COLONOSCOPY WITH PROPOFOL;  Surgeon: Lollie Sails, MD;  Location: [facility] ENDOSCOPY;  Service: Endoscopy;   Laterality: N/A;  . ESOPHAGOGASTRODUODENOSCOPY    . ESOPHAGOGASTRODUODENOSCOPY (EGD) WITH PROPOFOL N/A 12/07/2016   Procedure: ESOPHAGOGASTRODUODENOSCOPY (EGD) WITH PROPOFOL;  Surgeon: Lollie Sails, MD;  Location: [facility] ENDOSCOPY;  Service: Endoscopy;  Laterality: N/A;  . ESOPHAGOGASTRODUODENOSCOPY (EGD) WITH PROPOFOL N/A 01/07/2018   Procedure: ESOPHAGOGASTRODUODENOSCOPY (EGD) WITH PROPOFOL;  Surgeon: Lollie Sails, MD;  Location: [facility] ENDOSCOPY;  Service: Endoscopy;  Laterality: N/A;  . EYE SURGERY    . HEMORRHOID SURGERY    . PARTIAL HYSTERECTOMY    . TONSILLECTOMY      There were no vitals filed for this visit.      Pelvic Floor Physical Therapy Treatment Note  SCREENING  Changes in medications, allergies, or medical history?: no    SUBJECTIVE  Patient reports: She had a bad day yesterday, feels like she might be fighting off something and did not sleep well that night. She has some loose bowels this morning. Has only had leakage this morning with her stomach being upset. She has found that she can get up off the ground now unless she does not have something to push up on. She was embarrassed by having to crawl across her lawn to get to something to push up on.  Precautions:  Osteoporosis  Pain update: Pt. Reports no pain  Patient Goals: She would like to be able to return to water aerobics without fear of leakage of stool or urine.   OBJECTIVE  Changes in:  Pelvic floor: Able to feel PFM drawing up and in in seated position with posterior pelvic tilt and exhale.  Abdominal:  Pt. Demonstrates difficulty with coordination of the diaphragm for deep inhale more than exhale in seated.   INTERVENTIONS THIS SESSION: Therex: Educated on and practiced how to get into and out of a half-kneel position to progress toward standing from a lunge in a situation where she cannot push up on something.  NM re-ed: Educauted on and practiced coordination of breathing with  pelvic tilt in seated and starting the exhale just before she sits or stands to improve recruitment and timing of muscles to decrease leakage. Self-care: Educuated on soda-can theory and how this will help her decrease pressure down on her PFM to decrease prolapse and leakage.  Total time: 50 min.                        PT Education - 10/17/18 1112    Education Details  See Pt. Instructions and Interventions this session.    Person(s) Educated  Patient    Methods  Explanation;Demonstration;Tactile cues;Verbal cues;Handout    Comprehension  Verbalized understanding;Returned demonstration;Verbal cues required;Tactile cues required       PT Short Term Goals - 10/08/18 1445      PT SHORT TERM GOAL #1   Title  Patient will demonstrate a coordinated contraction, relaxation, and bulge of the pelvic floor muscles to demonstrate functional recruitment and motion and allow for further strengthening.    Baseline  Pt. has little to no coordination or activation of the PFM with cueing.    Time  5    Period  Weeks    Status  New    Target Date  11/12/18      PT SHORT TERM GOAL #2   Title  Patient will demonstrate coordinated diaphragmatic breathing with pelvic tilts to demonstrate improved control of diaphragm and TA, to allow for further strengthening of core musculature and decreased pelvic floor spasm    Baseline  Pt. has mild prolapse of anterior vaginal wall and feels "bulge" occasionally, demonstrates breathing dysfunction.     Time  5    Period  Weeks    Status  New    Target Date  11/12/18      PT SHORT TERM GOAL #3   Title  Patient will demonstrate improved sitting and standing posture to demonstrate learning and decrease stress on the pelvic floor with functional activity.    Baseline  Posterior pelvic tilt, knees bent, buttock gripping    Time  5    Period  Weeks    Status  New    Target Date  11/12/18        PT Long Term Goals - 10/08/18 1449      PT  LONG TERM GOAL #1   Title  Patient will report no episodes of SUI over the course of the prior two weeks to demonstrate improved functional ability.    Baseline  Pt. wearing depends at night and #6 pads during the day, having leakage frequently with urge upon standing.    Time  10    Period  Weeks    Status  New    Target Date  12/17/18  PT LONG TERM GOAL #2   Title  Patient will score at or below 25/100 on the POPDI-6 and the CRADI-8 to demonstrate a clinically meaningful decrease in disability and distress due to pelvic floor dysfunction.    Baseline  CRADI-8: 50/100, POPDI-6: 50/100    Time  10    Period  Weeks    Status  New    Target Date  12/20/18      PT LONG TERM GOAL #3   Title  Patient will report urinating 6-8 times per day over the course of the prior week to demonstrate decreased frequency.    Baseline  Has to urinate immediately upon standing after sitting for any length of time.    Time  10    Period  Weeks    Status  New    Target Date  12/17/18      PT LONG TERM GOAL #4   Title  Patient will report no episodes of Fecal Incontinence over the past two weeks to demonstrate improved function and quality of life and to allow her to participate in water aerobics as part of her occupation.    Baseline  She has had to stop going to water aerobics due to incotinence.    Time  10    Period  Weeks    Status  New    Target Date  12/17/18            Plan - 10/17/18 1116    Clinical Impression Statement  Pt. Has been doing well but had a tough day this morning with fecal leakage due to more loose stool, poor sleep and feeling slightly ill yesterday. She responded well to all interventions today, having some self-doubt and needing maximal cueing with transition to seated exercise but performed well and demonstrated improved coordination following treatment.     Clinical Presentation  Evolving    Clinical Decision Making  High    Rehab Potential  Fair    Clinical  Impairments Affecting Rehab Potential  Sleep Apnea, hemorrhoids, asthma, osteoporosis, partial hysterectomy from fibroids and cervical lesions, obesity, as well as an appendectomy    PT Frequency  2x / week    PT Duration  Other (comment)    PT Treatment/Interventions  ADLs/Self Care Home Management;Biofeedback;Electrical Stimulation;Moist Heat;Traction;Gait training;Functional mobility training;Therapeutic activities;Therapeutic exercise;Balance training;Neuromuscular re-education;Manual techniques;Patient/family education;Scar mobilization;Passive range of motion;Dry needling;Taping;Spinal Manipulations;Joint Manipulations    PT Next Visit Plan  test hip strength and assess muscles for spasms in LB and hip-flexors. Review and practice breathing and add pelvic tilt on a ball with mirror. eventually use biofeedback, teach urge-suppression    PT Home Exercise Plan  diaphragmatic breathing, posterior pelvic tilts with hips elevated and kegel.    Consulted and Agree with Plan of Care  Patient       Patient will benefit from skilled therapeutic intervention in order to improve the following deficits and impairments:  Abnormal gait, Increased fascial restricitons, Impaired sensation, Improper body mechanics, Cardiopulmonary status limiting activity, Decreased coordination, Decreased mobility, Decreased scar mobility, Increased muscle spasms, Impaired tone, Postural dysfunction, Decreased endurance, Decreased range of motion, Decreased strength, Decreased balance, Difficulty walking, Obesity, Impaired flexibility  Visit Diagnosis: Other lack of coordination  Other muscle spasm  Abnormal posture  Muscle weakness (generalized)     Problem List Patient Active Problem List   Diagnosis Date Noted  . Diabetes mellitus type 2 in obese ([place]) 09/04/2017  . Bilateral lower extremity edema 09/04/2017  . Gastroesophageal reflux disease  without esophagitis 05/16/2017  . Diarrhea 12/29/2016  . Enteritis  12/29/2016  . Acute lower UTI 12/29/2016  . Incomplete emptying of bladder 11/01/2016  . Urge incontinence 07/14/2015  . Chronic renal failure, stage 3 (moderate) ([place]) 08/06/2014  . Renal mass, right 08/06/2014  . Precordial pain 07/24/2014  . Dyspnea 07/24/2014  . Palpitations 07/24/2014  . Essential hypertension 07/24/2014  . Hyperlipidemia 07/24/2014  . Bladder neoplasm of uncertain malignant potential 07/16/2014  . Gross hematuria 07/16/2014  . Asthma 05/17/2014  . Osteoporosis 05/17/2014  . Steatohepatitis 05/17/2014  . OSA on CPAP 05/17/2014   [name] DPT, ATC [name] 10/17/2018, 11:45 AM  [place] MAIN [facility] SERVICES [address] [place], [place], [address] Phone: [phone]   Fax:  [phone]  Name: [name] MRN: [id number] Date of Birth: 01-08-1941

## 2018-10-23 ENCOUNTER — Ambulatory Visit: Payer: Medicare Other

## 2018-10-23 DIAGNOSIS — R278 Other lack of coordination: Secondary | ICD-10-CM | POA: Diagnosis not present

## 2018-10-23 DIAGNOSIS — R293 Abnormal posture: Secondary | ICD-10-CM

## 2018-10-23 DIAGNOSIS — M62838 Other muscle spasm: Secondary | ICD-10-CM

## 2018-10-23 DIAGNOSIS — M6281 Muscle weakness (generalized): Secondary | ICD-10-CM

## 2018-10-23 NOTE — Therapy (Signed)
[place] MAIN [facility] SERVICES [place], [place], [address] Phone: [phone]   Fax:  [phone]  Physical Therapy Treatment  Patient Details  Name: [name] MRN: [id number] Date of Birth: Dec 29, 1940 Referring Provider (PT): Karolee Ohs   Encounter Date: 10/23/2018  PT End of Session - 10/23/18 1210    Visit Number  5    Number of Visits  20    Date for PT Re-Evaluation  12/17/18    Authorization Type  Progress Note due on 10th visit (2-11)    Authorization - Visit Number  5    Authorization - Number of Visits  10    PT Start Time  1003    PT Stop Time  1103    PT Time Calculation (min)  60 min    Activity Tolerance  Patient tolerated treatment well;No increased pain    Behavior During Therapy  WFL for tasks assessed/performed       Past Medical History:  Diagnosis Date  . Albuminuria   . Arthritis   . Asthma   . Cancer ([place])    skin  . Cataract cortical, senile   . Diabetes mellitus without complication ([place])   . GERD (gastroesophageal reflux disease)   . Hemorrhoids   . History of kidney stones   . Hyperlipidemia   . Hypertension   . Lyme disease   . 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 OSA (obstructive sleep apnea)   . Osteoporosis   . Osteoporosis   . Reflux esophagitis   . Steatohepatitis   . Steatohepatitis     Past Surgical History:  Procedure Laterality Date  . ABDOMINAL HYSTERECTOMY    . APPENDECTOMY    . [place]  . CARDIAC CATHETERIZATION  08/13/2014   [facility]. no significant CAD, normal LVEDP.   Marland Kitchen CATARACT EXTRACTION    . CHOLECYSTECTOMY    . COLONOSCOPY    . COLONOSCOPY WITH PROPOFOL N/A 12/07/2016   Procedure: COLONOSCOPY WITH PROPOFOL;  Surgeon: Lollie Sails, MD;  Location: [facility] ENDOSCOPY;  Service: Endoscopy;  Laterality: N/A;  . ESOPHAGOGASTRODUODENOSCOPY    . ESOPHAGOGASTRODUODENOSCOPY (EGD) WITH PROPOFOL N/A 12/07/2016   Procedure: ESOPHAGOGASTRODUODENOSCOPY  (EGD) WITH PROPOFOL;  Surgeon: Lollie Sails, MD;  Location: [facility] ENDOSCOPY;  Service: Endoscopy;  Laterality: N/A;  . ESOPHAGOGASTRODUODENOSCOPY (EGD) WITH PROPOFOL N/A 01/07/2018   Procedure: ESOPHAGOGASTRODUODENOSCOPY (EGD) WITH PROPOFOL;  Surgeon: Lollie Sails, MD;  Location: [facility] ENDOSCOPY;  Service: Endoscopy;  Laterality: N/A;  . EYE SURGERY    . HEMORRHOID SURGERY    . PARTIAL HYSTERECTOMY    . TONSILLECTOMY      There were no vitals filed for this visit.    Pelvic Floor Physical Therapy Treatment Note  SCREENING  Changes in medications, allergies, or medical history?: no    SUBJECTIVE  Patient reports: She has noticed that she can bend to pick up things off the floor now and has been doing much better with her posture and when she stands from sitting. Had a moderate amount of leakage this morning after having slept through the whole night. She is having firmer BM's and only losing stool when she has some kind of bug.  Precautions:  Osteoporosis  Pain update: No pain  Patient Goals: She would like to be able to return to water aerobics without fear of leakage of stool or urine.    OBJECTIVE  Changes in: Posture/Observations:  Walking much better, standing tall  and did not push off the chair with her when standing in waiting room.  Range of Motion/Flexibilty:  Pt. Has difficulty achieving anterior pelvic tilt due to restriction in lumbar extension ROM.  Pelvic floor: Pt. Able to recruit better in sitting position than semi-reclined position. Endurance is ~ 3-5 seconds and maximal force output ranges from 10-15 in lying and 20-25 in sitting.  Gait Analysis: Pt. Is taking longer strides with more upright posture.  INTERVENTIONS THIS SESSION: NM re-ed: Used tactile, verbal, and visual cueing to improve coordination and recruitment of PFM and other deep core musculature to allow for further strengthening and control of bowel and bladder.  Bio-feedback:  Worked on increasing maximal contraction and endurance of PFM in semi-reclined and seated positions with biofeedback as visual feedback to help Pt. Improve proprioception of correct performance.  Therex: reviewed importance and performance of hook-lying posterior pelvic tilts with hips elevated as well as seated with kegels.    Total time: 60 min.                         PT Education - 10/23/18 1210    Education Details  See Interventions this session.    Person(s) Educated  Patient    Methods  Explanation;Demonstration;Tactile cues;Verbal cues    Comprehension  Verbalized understanding;Returned demonstration;Verbal cues required;Tactile cues required;Need further instruction       PT Short Term Goals - 10/08/18 1445      PT SHORT TERM GOAL #1   Title  Patient will demonstrate a coordinated contraction, relaxation, and bulge of the pelvic floor muscles to demonstrate functional recruitment and motion and allow for further strengthening.    Baseline  Pt. has little to no coordination or activation of the PFM with cueing.    Time  5    Period  Weeks    Status  New    Target Date  11/12/18      PT SHORT TERM GOAL #2   Title  Patient will demonstrate coordinated diaphragmatic breathing with pelvic tilts to demonstrate improved control of diaphragm and TA, to allow for further strengthening of core musculature and decreased pelvic floor spasm    Baseline  Pt. has mild prolapse of anterior vaginal wall and feels "bulge" occasionally, demonstrates breathing dysfunction.     Time  5    Period  Weeks    Status  New    Target Date  11/12/18      PT SHORT TERM GOAL #3   Title  Patient will demonstrate improved sitting and standing posture to demonstrate learning and decrease stress on the pelvic floor with functional activity.    Baseline  Posterior pelvic tilt, knees bent, buttock gripping    Time  5    Period  Weeks    Status  New    Target Date  11/12/18         PT Long Term Goals - 10/08/18 1449      PT LONG TERM GOAL #1   Title  Patient will report no episodes of SUI over the course of the prior two weeks to demonstrate improved functional ability.    Baseline  Pt. wearing depends at night and #6 pads during the day, having leakage frequently with urge upon standing.    Time  10    Period  Weeks    Status  New    Target Date  12/17/18      PT LONG TERM GOAL #  2   Title  Patient will score at or below 25/100 on the POPDI-6 and the CRADI-8 to demonstrate a clinically meaningful decrease in disability and distress due to pelvic floor dysfunction.    Baseline  CRADI-8: 50/100, POPDI-6: 50/100    Time  10    Period  Weeks    Status  New    Target Date  12/20/18      PT LONG TERM GOAL #3   Title  Patient will report urinating 6-8 times per day over the course of the prior week to demonstrate decreased frequency.    Baseline  Has to urinate immediately upon standing after sitting for any length of time.    Time  10    Period  Weeks    Status  New    Target Date  12/17/18      PT LONG TERM GOAL #4   Title  Patient will report no episodes of Fecal Incontinence over the past two weeks to demonstrate improved function and quality of life and to allow her to participate in water aerobics as part of her occupation.    Baseline  She has had to stop going to water aerobics due to incotinence.    Time  10    Period  Weeks    Status  New    Target Date  12/17/18            Plan - 10/23/18 1211    Clinical Impression Statement  Pt. is doing well, noticing improved strength overall with activities such as squatting to pick something from the floor and putting on her shoes as well as great reduction in incontinence. Continue per POC.     Clinical Presentation  Evolving    Clinical Decision Making  High    Rehab Potential  Fair    Clinical Impairments Affecting Rehab Potential  Sleep Apnea, hemorrhoids, asthma, osteoporosis, partial  hysterectomy from fibroids and cervical lesions, obesity, as well as an appendectomy    PT Frequency  2x / week    PT Duration  Other (comment)    PT Treatment/Interventions  ADLs/Self Care Home Management;Biofeedback;Electrical Stimulation;Moist Heat;Traction;Gait training;Functional mobility training;Therapeutic activities;Therapeutic exercise;Balance training;Neuromuscular re-education;Manual techniques;Patient/family education;Scar mobilization;Passive range of motion;Dry needling;Taping;Spinal Manipulations;Joint Manipulations    PT Next Visit Plan  test hip strength and assess muscles for spasms in LB and hip-flexors. Review and practice breathing and add pelvic tilt on a ball with mirror. eventually use biofeedback, teach urge-suppression    PT Home Exercise Plan  diaphragmatic breathing, posterior pelvic tilts with hips elevated and kegel.    Consulted and Agree with Plan of Care  Patient       Patient will benefit from skilled therapeutic intervention in order to improve the following deficits and impairments:  Abnormal gait, Increased fascial restricitons, Impaired sensation, Improper body mechanics, Cardiopulmonary status limiting activity, Decreased coordination, Decreased mobility, Decreased scar mobility, Increased muscle spasms, Impaired tone, Postural dysfunction, Decreased endurance, Decreased range of motion, Decreased strength, Decreased balance, Difficulty walking, Obesity, Impaired flexibility  Visit Diagnosis: Other lack of coordination  Other muscle spasm  Abnormal posture  Muscle weakness (generalized)     Problem List Patient Active Problem List   Diagnosis Date Noted  . Diabetes mellitus type 2 in obese ([place]) 09/04/2017  . Bilateral lower extremity edema 09/04/2017  . Gastroesophageal reflux disease without esophagitis 05/16/2017  . Diarrhea 12/29/2016  . Enteritis 12/29/2016  . Acute lower UTI 12/29/2016  . Incomplete emptying of bladder 11/01/2016  .  Urge  incontinence 07/14/2015  . Chronic renal failure, stage 3 (moderate) ([place]) 08/06/2014  . Renal mass, right 08/06/2014  . Precordial pain 07/24/2014  . Dyspnea 07/24/2014  . Palpitations 07/24/2014  . Essential hypertension 07/24/2014  . Hyperlipidemia 07/24/2014  . Bladder neoplasm of uncertain malignant potential 07/16/2014  . Gross hematuria 07/16/2014  . Asthma 05/17/2014  . Osteoporosis 05/17/2014  . Steatohepatitis 05/17/2014  . OSA on CPAP 05/17/2014   Willa Rough DPT, ATC Willa Rough 10/23/2018, 12:18 PM  [place] MAIN [facility] SERVICES [address] [place], [place], [address] Phone: [phone]   Fax:  [phone]  Name: [name] MRN: [id number] Date of Birth: [birth year]

## 2018-10-26 IMAGING — US US EXTREM LOW VENOUS*L*
1 series · 13 of 24 positions shown · non-contrast
Comparison: 07/09/2012

CLINICAL DATA: Left leg swelling.



[Series 1: us extrem low venous*left* · 0.09mm/px · 13 of 32 slices shown]
[im 1/32]
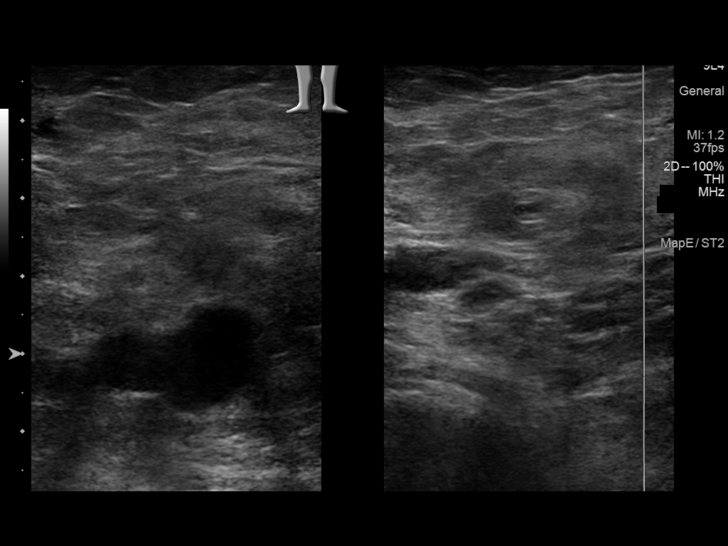
[im 3/32]
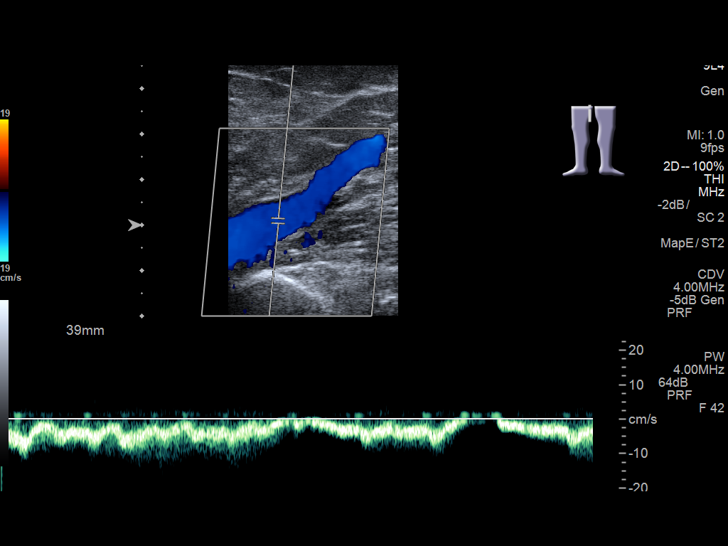
[im 6/32]
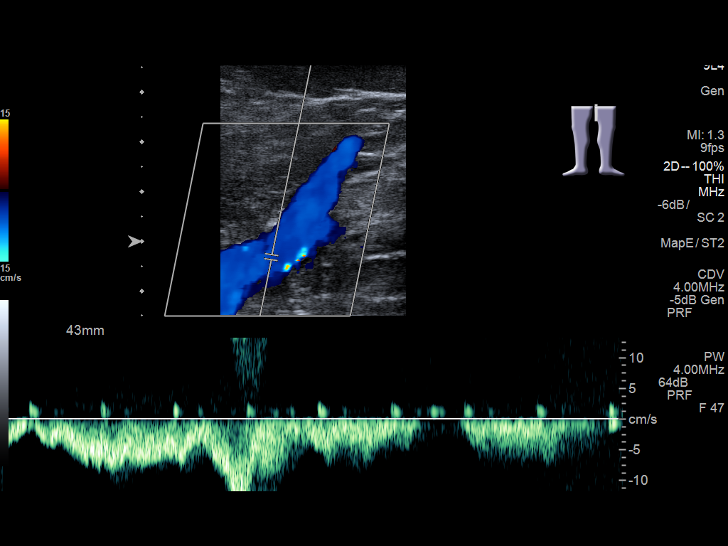
[im 9/32]
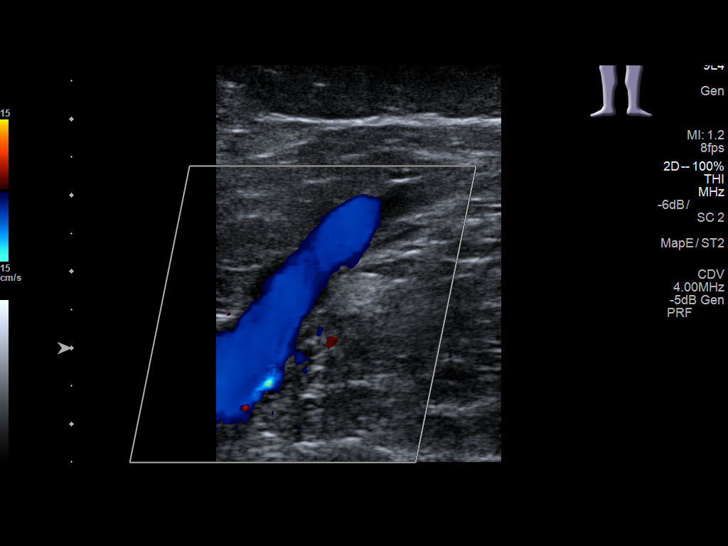
[im 11/32]
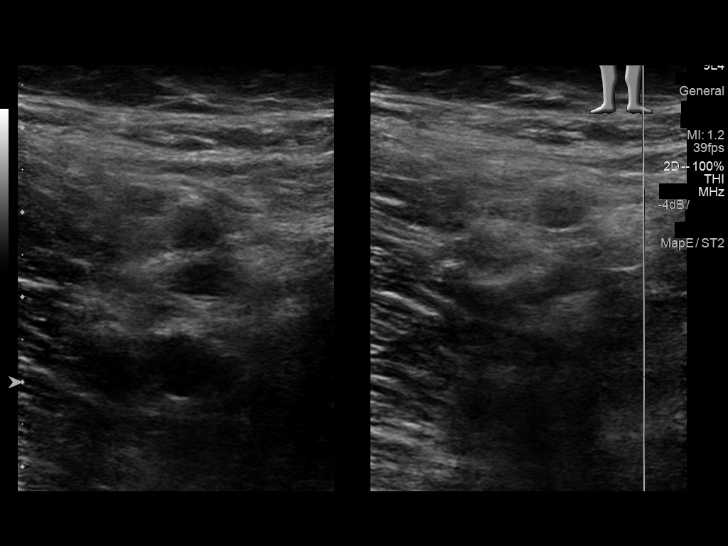
[im 14/32]
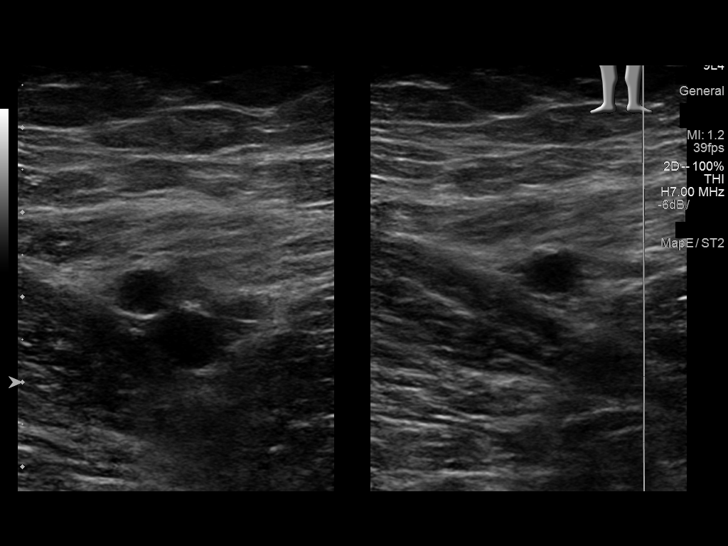
[im 17/32]
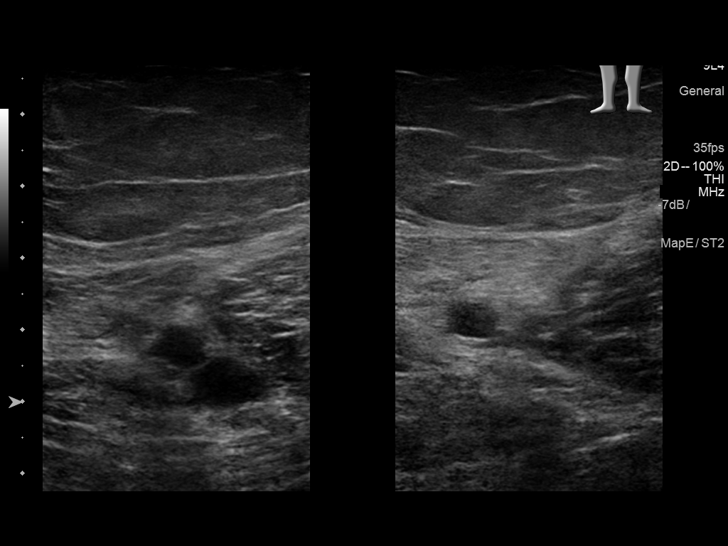
[im 18/32]
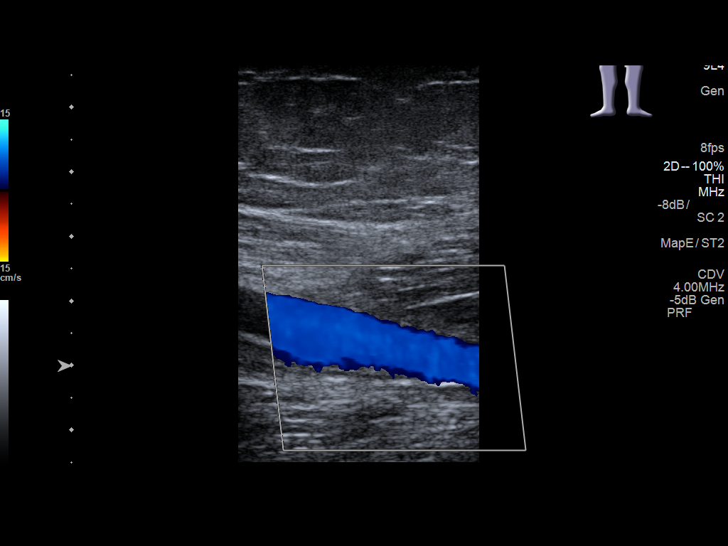
[im 21/32]
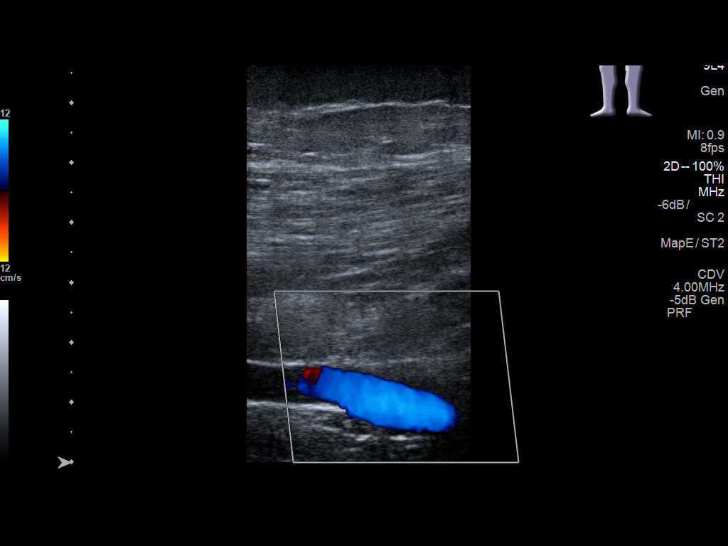
[im 23/32]
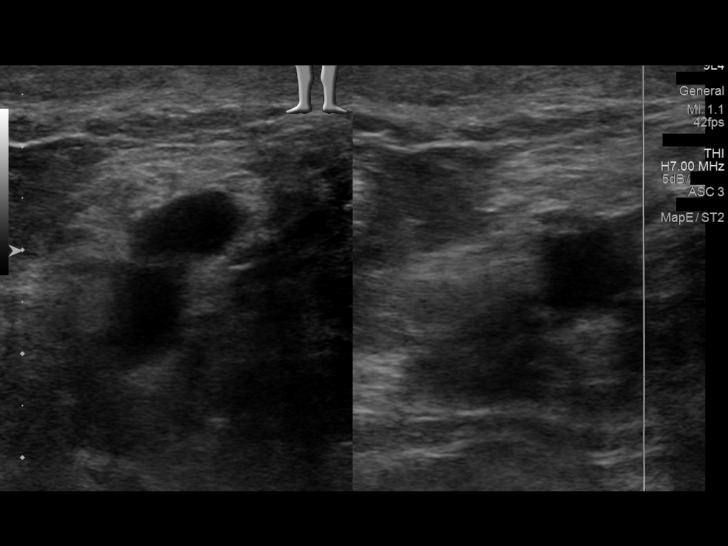
[im 26/32]
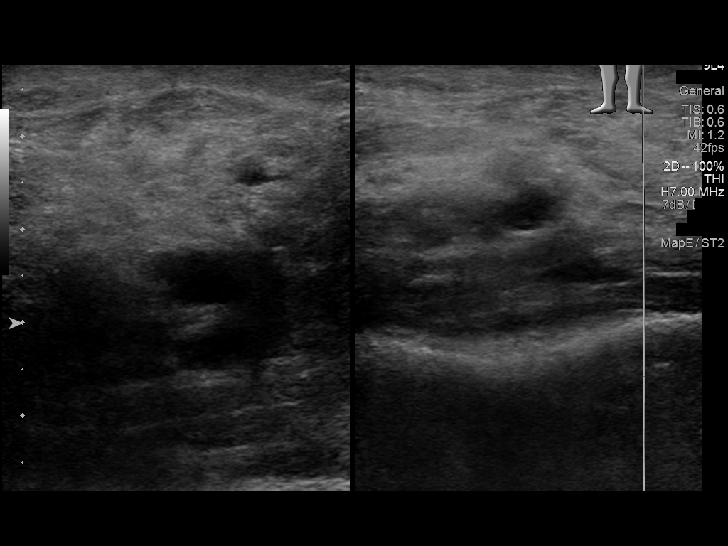
[im 29/32]
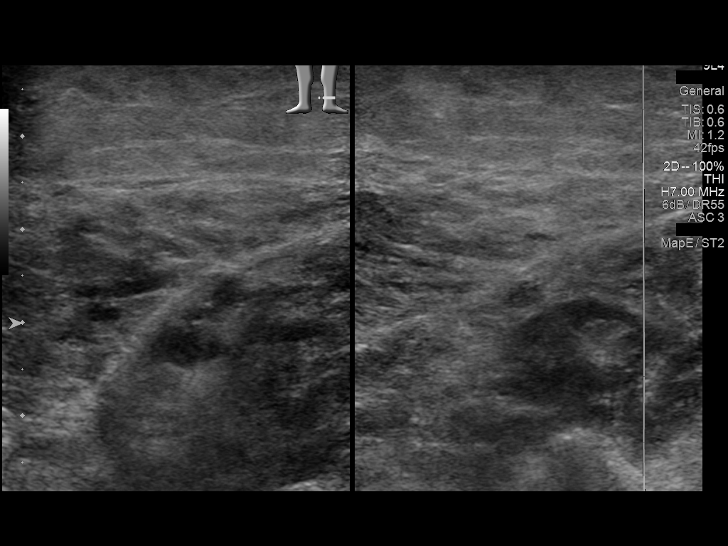
[im 32/32]
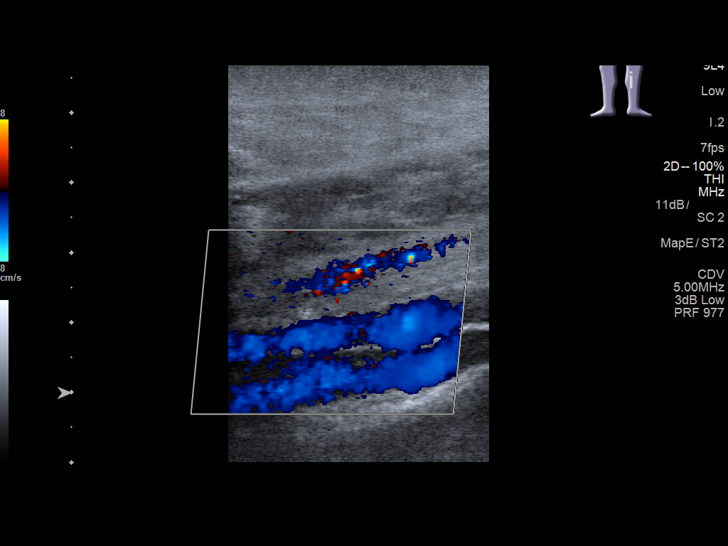

[13 of 24 positions shown; findings below may reference images not displayed]

FINDINGS: Contralateral Common Femoral Vein: Respiratory phasicity is normal
and symmetric with the symptomatic side. No evidence of thrombus.
Normal compressibility.

Common Femoral Vein: No evidence of thrombus. Normal
compressibility, respiratory phasicity and response to augmentation.

Saphenofemoral Junction: No evidence of thrombus. Normal
compressibility and flow on color Doppler imaging.

Profunda Femoral Vein: No evidence of thrombus. Normal
compressibility and flow on color Doppler imaging.

Femoral Vein: No evidence of thrombus. Normal compressibility,
respiratory phasicity and response to augmentation.

Popliteal Vein: No evidence of thrombus. Normal compressibility,
respiratory phasicity and response to augmentation.

Calf Veins: No evidence of thrombus. Normal compressibility and flow
on color Doppler imaging.

Superficial Great Saphenous Vein: No evidence of thrombus. Normal
compressibility and flow on color Doppler imaging.

Venous Reflux:  None.

Other Findings:  None.
IMPRESSION: No evidence of deep venous thrombosis.

## 2018-10-29 ENCOUNTER — Ambulatory Visit: Payer: Medicare Other

## 2018-10-29 DIAGNOSIS — R278 Other lack of coordination: Secondary | ICD-10-CM | POA: Diagnosis not present

## 2018-10-29 DIAGNOSIS — M62838 Other muscle spasm: Secondary | ICD-10-CM

## 2018-10-29 DIAGNOSIS — R293 Abnormal posture: Secondary | ICD-10-CM

## 2018-10-29 DIAGNOSIS — M6281 Muscle weakness (generalized): Secondary | ICD-10-CM

## 2018-10-29 NOTE — Therapy (Signed)
[facility] SERVICES [address] [place], [place], [address] Phone: [phone]   Fax:  [phone]  Physical Therapy Treatment  Patient Details  Name: [name] MRN: [id number] Date of Birth: 06/27/1941 Referring Provider (PT): Karolee Ohs   Encounter Date: 10/29/2018  PT End of Session - 10/29/18 1421    Visit Number  6    Number of Visits  20    Date for PT Re-Evaluation  12/17/18    Authorization Type  Progress Note due on 10th visit (2-11)    Authorization - Visit Number  6    Authorization - Number of Visits  10    PT Start Time  7939    PT Stop Time  1400    PT Time Calculation (min)  65 min    Activity Tolerance  Patient tolerated treatment well;No increased pain    Behavior During Therapy  WFL for tasks assessed/performed       Past Medical History:  Diagnosis Date  . Albuminuria   . Arthritis   . Asthma   . Cancer ([place])    skin  . Cataract cortical, senile   . Diabetes mellitus without complication ([place])   . GERD (gastroesophageal reflux disease)   . Hemorrhoids   . History of kidney stones   . Hyperlipidemia   . Hypertension   . Lyme disease   . 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 OSA (obstructive sleep apnea)   . Osteoporosis   . Osteoporosis   . Reflux esophagitis   . Steatohepatitis   . Steatohepatitis     Past Surgical History:  Procedure Laterality Date  . ABDOMINAL HYSTERECTOMY    . APPENDECTOMY    . [place]  . CARDIAC CATHETERIZATION  08/13/2014   [facility]. no significant CAD, normal LVEDP.   Marland Kitchen CATARACT EXTRACTION    . CHOLECYSTECTOMY    . COLONOSCOPY    . COLONOSCOPY WITH PROPOFOL N/A 12/07/2016   Procedure: COLONOSCOPY WITH PROPOFOL;  Surgeon: Lollie Sails, MD;  Location: [facility] ENDOSCOPY;  Service: Endoscopy;  Laterality: N/A;  . ESOPHAGOGASTRODUODENOSCOPY    . ESOPHAGOGASTRODUODENOSCOPY (EGD) WITH PROPOFOL N/A 12/07/2016   Procedure: ESOPHAGOGASTRODUODENOSCOPY  (EGD) WITH PROPOFOL;  Surgeon: Lollie Sails, MD;  Location: [facility] ENDOSCOPY;  Service: Endoscopy;  Laterality: N/A;  . ESOPHAGOGASTRODUODENOSCOPY (EGD) WITH PROPOFOL N/A 01/07/2018   Procedure: ESOPHAGOGASTRODUODENOSCOPY (EGD) WITH PROPOFOL;  Surgeon: Lollie Sails, MD;  Location: [facility] ENDOSCOPY;  Service: Endoscopy;  Laterality: N/A;  . EYE SURGERY    . HEMORRHOID SURGERY    . PARTIAL HYSTERECTOMY    . TONSILLECTOMY      There were no vitals filed for this visit.    Pelvic Floor Physical Therapy Treatment Note  SCREENING  Changes in medications, allergies, or medical history?: no   SUBJECTIVE  Patient reports: "bout the same" but drank some green tea and it sent her to the bathroom a lot (no leakage). This helped with her swelling. Yesterday she had some fecal incontinence when getting out of the car. She had a bigger accident on Saturday with fecal incontinence when she was nervous because she was far from a restroom..   Precautions:  Osteoporosis  Pain update: No pain  Patient Goals: She would like to be able to return to water aerobics without fear of leakage of stool or urine.   OBJECTIVE  Changes in: Posture/Observations:  Pt. Is continuing to use improved posture but has short  stride length.  Range of Motion/Flexibilty:  Pt is so lordotic that she has difficulty getting anterior pelvic tilt activation to feel the difference with posterior pelvic tilt.  Pelvic floor: Able to palpate OI contraction externally with posterior pelvic tilt and hips elevated.  Abdominal:  Pt. Demonstrates improved coordination of TA with posterior pelvic tilt with hips elevated compared to other positions (though she feels less confident)  INTERVENTIONS THIS SESSION: Nm re-ed: reviewed pelvic tilts in prone with pillow and used visualization to improve lift with kegel during posterior tilt. Educated on and practiced pelvic tilts in seated on a ball and in standing to improve  coordination and recruitment in more functional positions. Educated on and practiced timing and coordination of deep core and glutes with step-ups to integrate PFM in with proximal stability muscles for function. Educated on and practiced urge-suppression technique to decrease incontinence with urge by down-regulating nervous system. Therex: Educated on and practiced low-back stretch to decrease hyperlordosis and improve ROM with pelvic tilt.  Total time: 65 min.                         PT Education - 10/29/18 1420    Education Details  See Pt. Instructions and Interventions this session.    Person(s) Educated  Patient    Methods  Explanation;Demonstration;Tactile cues;Verbal cues;Handout    Comprehension  Verbalized understanding;Returned demonstration;Verbal cues required;Tactile cues required;Need further instruction       PT Short Term Goals - 10/08/18 1445      PT SHORT TERM GOAL #1   Title  Patient will demonstrate a coordinated contraction, relaxation, and bulge of the pelvic floor muscles to demonstrate functional recruitment and motion and allow for further strengthening.    Baseline  Pt. has little to no coordination or activation of the PFM with cueing.    Time  5    Period  Weeks    Status  New    Target Date  11/12/18      PT SHORT TERM GOAL #2   Title  Patient will demonstrate coordinated diaphragmatic breathing with pelvic tilts to demonstrate improved control of diaphragm and TA, to allow for further strengthening of core musculature and decreased pelvic floor spasm    Baseline  Pt. has mild prolapse of anterior vaginal wall and feels "bulge" occasionally, demonstrates breathing dysfunction.     Time  5    Period  Weeks    Status  New    Target Date  11/12/18      PT SHORT TERM GOAL #3   Title  Patient will demonstrate improved sitting and standing posture to demonstrate learning and decrease stress on the pelvic floor with functional activity.     Baseline  Posterior pelvic tilt, knees bent, buttock gripping    Time  5    Period  Weeks    Status  New    Target Date  11/12/18        PT Long Term Goals - 10/08/18 1449      PT LONG TERM GOAL #1   Title  Patient will report no episodes of SUI over the course of the prior two weeks to demonstrate improved functional ability.    Baseline  Pt. wearing depends at night and #6 pads during the day, having leakage frequently with urge upon standing.    Time  10    Period  Weeks    Status  New    Target Date  12/17/18      PT LONG TERM GOAL #2   Title  Patient will score at or below 25/100 on the POPDI-6 and the CRADI-8 to demonstrate a clinically meaningful decrease in disability and distress due to pelvic floor dysfunction.    Baseline  CRADI-8: 50/100, POPDI-6: 50/100    Time  10    Period  Weeks    Status  New    Target Date  12/20/18      PT LONG TERM GOAL #3   Title  Patient will report urinating 6-8 times per day over the course of the prior week to demonstrate decreased frequency.    Baseline  Has to urinate immediately upon standing after sitting for any length of time.    Time  10    Period  Weeks    Status  New    Target Date  12/17/18      PT LONG TERM GOAL #4   Title  Patient will report no episodes of Fecal Incontinence over the past two weeks to demonstrate improved function and quality of life and to allow her to participate in water aerobics as part of her occupation.    Baseline  She has had to stop going to water aerobics due to incotinence.    Time  10    Period  Weeks    Status  New    Target Date  12/17/18            Plan - 10/29/18 1422    Clinical Impression Statement  Pt. responded well to all interventions today, demonstrating improved coordination of muscles surrounding the pelvis in variable positions and greater lift of PFM in a hips-elevated position as well as understanding of all education provided. Continue per POC.    Clinical  Presentation  Evolving    Clinical Decision Making  High    Rehab Potential  Fair    Clinical Impairments Affecting Rehab Potential  Sleep Apnea, hemorrhoids, asthma, osteoporosis, partial hysterectomy from fibroids and cervical lesions, obesity, as well as an appendectomy    PT Frequency  2x / week    PT Duration  Other (comment)    PT Treatment/Interventions  ADLs/Self Care Home Management;Biofeedback;Electrical Stimulation;Moist Heat;Traction;Gait training;Functional mobility training;Therapeutic activities;Therapeutic exercise;Balance training;Neuromuscular re-education;Manual techniques;Patient/family education;Scar mobilization;Passive range of motion;Dry needling;Taping;Spinal Manipulations;Joint Manipulations    PT Next Visit Plan  test hip strength and assess muscles for spasms in LB and hip-flexors. Review and practice breathing and add pelvic tilt on a ball with mirror. eventually use biofeedback, teach urge-suppression    PT Home Exercise Plan  diaphragmatic breathing, posterior pelvic tilts with hips elevated and kegel, low back stretch, seated posterior pelvic tilt, urge-suppression.    Consulted and Agree with Plan of Care  Patient       Patient will benefit from skilled therapeutic intervention in order to improve the following deficits and impairments:  Abnormal gait, Increased fascial restricitons, Impaired sensation, Improper body mechanics, Cardiopulmonary status limiting activity, Decreased coordination, Decreased mobility, Decreased scar mobility, Increased muscle spasms, Impaired tone, Postural dysfunction, Decreased endurance, Decreased range of motion, Decreased strength, Decreased balance, Difficulty walking, Obesity, Impaired flexibility  Visit Diagnosis: Other lack of coordination  Other muscle spasm  Abnormal posture  Muscle weakness (generalized)     Problem List Patient Active Problem List   Diagnosis Date Noted  . Diabetes mellitus type 2 in obese ([place])  09/04/2017  . Bilateral lower extremity edema 09/04/2017  . Gastroesophageal reflux disease without esophagitis 05/16/2017  .  Diarrhea 12/29/2016  . Enteritis 12/29/2016  . Acute lower UTI 12/29/2016  . Incomplete emptying of bladder 11/01/2016  . Urge incontinence 07/14/2015  . Chronic renal failure, stage 3 (moderate) ([place]) 08/06/2014  . Renal mass, right 08/06/2014  . Precordial pain 07/24/2014  . Dyspnea 07/24/2014  . Palpitations 07/24/2014  . Essential hypertension 07/24/2014  . Hyperlipidemia 07/24/2014  . Bladder neoplasm of uncertain malignant potential 07/16/2014  . Gross hematuria 07/16/2014  . Asthma 05/17/2014  . Osteoporosis 05/17/2014  . Steatohepatitis 05/17/2014  . OSA on CPAP 05/17/2014   Willa Rough DPT, ATC Willa Rough 10/29/2018, 2:26 PM  [place] MAIN [facility] SERVICES [address] [place], [place], [address] Phone: [phone]   Fax:  [phone]  Name: [name] MRN: [id number] Date of Birth: 30-Jul-1941

## 2018-10-29 NOTE — Patient Instructions (Addendum)
Urge supression technique:  1) Take a deep breath to convince yourself that you are in control and calm the nervous system.  2) Do 5 "quick-flick" kegels (pelvic floor muscle contractions) and re-assess the urge. Repeat another set if urge is still present. 3) Once the urge has decreased, start walking calmly to the the bathroom. Stop and repeat steps 1 and 2 as many times as needed until you can successfully get to the toilet. 4) Only once seated, take a deep breath and allow the pelvic floor muscles to relax and allow for the urine to flow.    Do not be discouraged if you are not successful the first couple times, this is normal and it will take practice but remember that YOU are in control. Start by practicing this at home where you do not have to worry as much if there were to be an accident. Allowing yourself to get rushed or nervous puts the bladder back in control and will not allow the technique to work.    Do 2 sets of 15 tilts per day. Breathe in when you tilt forward (A) and out when you tuck under (B).    Relax all the way forward, tucking your hips under just slightly so you feel a stretch through your low back. Hold for 5 deep breaths, repeat 2-3 times, 1-2 times per day.

## 2018-10-31 ENCOUNTER — Ambulatory Visit: Payer: Medicare Other

## 2018-10-31 DIAGNOSIS — R278 Other lack of coordination: Secondary | ICD-10-CM

## 2018-10-31 DIAGNOSIS — R293 Abnormal posture: Secondary | ICD-10-CM

## 2018-10-31 DIAGNOSIS — M62838 Other muscle spasm: Secondary | ICD-10-CM

## 2018-10-31 DIAGNOSIS — M6281 Muscle weakness (generalized): Secondary | ICD-10-CM

## 2018-10-31 NOTE — Therapy (Signed)
[place] MAIN [facility] SERVICES [place], [place], [address] Phone: [phone]   Fax:  [phone]  Physical Therapy Treatment  Patient Details  Name: [name] MRN: [id number] Date of Birth: 16-Dec-1940 Referring Provider (PT): Karolee Ohs   Encounter Date: 10/31/2018  PT End of Session - 10/31/18 1405    Visit Number  7    Number of Visits  20    Date for PT Re-Evaluation  12/17/18    Authorization Type  Progress Note due on 10th visit (2-11)    Authorization - Visit Number  7    Authorization - Number of Visits  10    PT Start Time  1000    PT Stop Time  1100    PT Time Calculation (min)  60 min    Activity Tolerance  Patient tolerated treatment well;No increased pain    Behavior During Therapy  WFL for tasks assessed/performed       Past Medical History:  Diagnosis Date  . Albuminuria   . Arthritis   . Asthma   . Cancer ([place])    skin  . Cataract cortical, senile   . Diabetes mellitus without complication ([place])   . GERD (gastroesophageal reflux disease)   . Hemorrhoids   . History of kidney stones   . Hyperlipidemia   . Hypertension   . Lyme disease   . 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 OSA (obstructive sleep apnea)   . Osteoporosis   . Osteoporosis   . Reflux esophagitis   . Steatohepatitis   . Steatohepatitis     Past Surgical History:  Procedure Laterality Date  . ABDOMINAL HYSTERECTOMY    . APPENDECTOMY    . [place]  . CARDIAC CATHETERIZATION  08/13/2014   [facility]. no significant CAD, normal LVEDP.   Marland Kitchen CATARACT EXTRACTION    . CHOLECYSTECTOMY    . COLONOSCOPY    . COLONOSCOPY WITH PROPOFOL N/A 12/07/2016   Procedure: COLONOSCOPY WITH PROPOFOL;  Surgeon: Lollie Sails, MD;  Location: [facility] ENDOSCOPY;  Service: Endoscopy;  Laterality: N/A;  . ESOPHAGOGASTRODUODENOSCOPY    . ESOPHAGOGASTRODUODENOSCOPY (EGD) WITH PROPOFOL N/A 12/07/2016   Procedure: ESOPHAGOGASTRODUODENOSCOPY  (EGD) WITH PROPOFOL;  Surgeon: Lollie Sails, MD;  Location: [facility] ENDOSCOPY;  Service: Endoscopy;  Laterality: N/A;  . ESOPHAGOGASTRODUODENOSCOPY (EGD) WITH PROPOFOL N/A 01/07/2018   Procedure: ESOPHAGOGASTRODUODENOSCOPY (EGD) WITH PROPOFOL;  Surgeon: Lollie Sails, MD;  Location: [facility] ENDOSCOPY;  Service: Endoscopy;  Laterality: N/A;  . EYE SURGERY    . HEMORRHOID SURGERY    . PARTIAL HYSTERECTOMY    . TONSILLECTOMY      There were no vitals filed for this visit.    Pelvic Floor Physical Therapy Treatment Note  SCREENING  Changes in medications, allergies, or medical history?: no    SUBJECTIVE  Patient reports: Has not had any leakage "since she started using her head" and has been doing her exercises every 2 hours since last visit.  Precautions:  osteoporosis  Pain update: No pain  Patient Goals: She would like to be able to return to water aerobics without fear of leakage of stool or urine.   OBJECTIVE  Changes in: Posture/Observations:  Pt. Continues to demonstrate decreased lumbar mobility/ROM, leading to difficulty with pelvic tilts from decreased   Pelvic floor: Added difficulty by having decreased gravity providing proprioceptive input in hook-lying with no incline, Pt. Had more difficulty not over-recruiting glutes. Improved recruitment through the  session until fatigue achieved.  Abdominal: Pt. Is ~ 60% accurate with her TA recruitment without cueing and 80% with tactile cueing to approximate the rectus muscle.   INTERVENTIONS THIS SESSION: NM re-ed: Continued to work on recruitment, coordination, and timing of the PFM with use of verbal, visual, and tactile feedback as well as balloon to improve diaphragm engagement with PFM for decreased incontinence and ability to further increase strength with HEP. Increased difficulty for Pt. Due to completely supine positioning giving her less proprioceptive input for PFM recruitment. Biofeedback: Used  biofeedback with visual and auditory sensors to improve Pt's awareness of her timing and coordination with kegel and exhale/pelvic tilt due to Pt. Having tendency to kegel before beginning exhale and then lose contraction with addition of TA.    Total time: 60 min.                          PT Education - 10/31/18 1404    Education Details  See Interventions this session    Person(s) Educated  Patient    Methods  Explanation;Demonstration;Tactile cues;Verbal cues;Handout;Other (comment)   auditory, biofeedback   Comprehension  Verbalized understanding;Returned demonstration;Verbal cues required;Need further instruction;Tactile cues required       PT Short Term Goals - 10/08/18 1445      PT SHORT TERM GOAL #1   Title  Patient will demonstrate a coordinated contraction, relaxation, and bulge of the pelvic floor muscles to demonstrate functional recruitment and motion and allow for further strengthening.    Baseline  Pt. has little to no coordination or activation of the PFM with cueing.    Time  5    Period  Weeks    Status  New    Target Date  11/12/18      PT SHORT TERM GOAL #2   Title  Patient will demonstrate coordinated diaphragmatic breathing with pelvic tilts to demonstrate improved control of diaphragm and TA, to allow for further strengthening of core musculature and decreased pelvic floor spasm    Baseline  Pt. has mild prolapse of anterior vaginal wall and feels "bulge" occasionally, demonstrates breathing dysfunction.     Time  5    Period  Weeks    Status  New    Target Date  11/12/18      PT SHORT TERM GOAL #3   Title  Patient will demonstrate improved sitting and standing posture to demonstrate learning and decrease stress on the pelvic floor with functional activity.    Baseline  Posterior pelvic tilt, knees bent, buttock gripping    Time  5    Period  Weeks    Status  New    Target Date  11/12/18        PT Long Term Goals - 10/08/18 1449       PT LONG TERM GOAL #1   Title  Patient will report no episodes of SUI over the course of the prior two weeks to demonstrate improved functional ability.    Baseline  Pt. wearing depends at night and #6 pads during the day, having leakage frequently with urge upon standing.    Time  10    Period  Weeks    Status  New    Target Date  12/17/18      PT LONG TERM GOAL #2   Title  Patient will score at or below 25/100 on the POPDI-6 and the CRADI-8 to demonstrate a clinically meaningful decrease in  disability and distress due to pelvic floor dysfunction.    Baseline  CRADI-8: 50/100, POPDI-6: 50/100    Time  10    Period  Weeks    Status  New    Target Date  12/20/18      PT LONG TERM GOAL #3   Title  Patient will report urinating 6-8 times per day over the course of the prior week to demonstrate decreased frequency.    Baseline  Has to urinate immediately upon standing after sitting for any length of time.    Time  10    Period  Weeks    Status  New    Target Date  12/17/18      PT LONG TERM GOAL #4   Title  Patient will report no episodes of Fecal Incontinence over the past two weeks to demonstrate improved function and quality of life and to allow her to participate in water aerobics as part of her occupation.    Baseline  She has had to stop going to water aerobics due to incotinence.    Time  10    Period  Weeks    Status  New    Target Date  12/17/18            Plan - 10/31/18 1406    Clinical Impression Statement  Pt. responded well to all interventions today, demonstrating improvement in Sx between sessions and understanding of all education provided today. Continue per POC.    Clinical Presentation  Evolving    Clinical Decision Making  High    Rehab Potential  Fair    Clinical Impairments Affecting Rehab Potential  Sleep Apnea, hemorrhoids, asthma, osteoporosis, partial hysterectomy from fibroids and cervical lesions, obesity, as well as an appendectomy    PT  Frequency  2x / week    PT Duration  Other (comment)    PT Treatment/Interventions  ADLs/Self Care Home Management;Biofeedback;Electrical Stimulation;Moist Heat;Traction;Gait training;Functional mobility training;Therapeutic activities;Therapeutic exercise;Balance training;Neuromuscular re-education;Manual techniques;Patient/family education;Scar mobilization;Passive range of motion;Dry needling;Taping;Spinal Manipulations;Joint Manipulations    PT Next Visit Plan  Perform DN vs. TP release to Low back and work on mobility of spine to allow for improved recruitment and coordination.    PT Home Exercise Plan  diaphragmatic breathing, posterior pelvic tilts with hips elevated and kegel, low back stretch, seated posterior pelvic tilt, urge-suppression.    Consulted and Agree with Plan of Care  Patient       Patient will benefit from skilled therapeutic intervention in order to improve the following deficits and impairments:  Abnormal gait, Increased fascial restricitons, Impaired sensation, Improper body mechanics, Cardiopulmonary status limiting activity, Decreased coordination, Decreased mobility, Decreased scar mobility, Increased muscle spasms, Impaired tone, Postural dysfunction, Decreased endurance, Decreased range of motion, Decreased strength, Decreased balance, Difficulty walking, Obesity, Impaired flexibility  Visit Diagnosis: Other lack of coordination  Other muscle spasm  Abnormal posture  Muscle weakness (generalized)     Problem List Patient Active Problem List   Diagnosis Date Noted  . Diabetes mellitus type 2 in obese ([place]) 09/04/2017  . Bilateral lower extremity edema 09/04/2017  . Gastroesophageal reflux disease without esophagitis 05/16/2017  . Diarrhea 12/29/2016  . Enteritis 12/29/2016  . Acute lower UTI 12/29/2016  . Incomplete emptying of bladder 11/01/2016  . Urge incontinence 07/14/2015  . Chronic renal failure, stage 3 (moderate) ([place]) 08/06/2014  . Renal  mass, right 08/06/2014  . Precordial pain 07/24/2014  . Dyspnea 07/24/2014  . Palpitations 07/24/2014  . Essential hypertension  07/24/2014  . Hyperlipidemia 07/24/2014  . Bladder neoplasm of uncertain malignant potential 07/16/2014  . Gross hematuria 07/16/2014  . Asthma 05/17/2014  . Osteoporosis 05/17/2014  . Steatohepatitis 05/17/2014  . OSA on CPAP 05/17/2014   [name] DPT, ATC [name] 10/31/2018, 2:16 PM  [place] MAIN [facility] SERVICES [address] [place], [place], [address] Phone: [phone]   Fax:  [phone]  Name: [name] MRN: [id number] Date of Birth: [birth year]

## 2018-11-06 ENCOUNTER — Ambulatory Visit: Payer: Medicare Other | Attending: Urology

## 2018-11-06 DIAGNOSIS — M6281 Muscle weakness (generalized): Secondary | ICD-10-CM | POA: Insufficient documentation

## 2018-11-06 DIAGNOSIS — R278 Other lack of coordination: Secondary | ICD-10-CM | POA: Diagnosis not present

## 2018-11-06 DIAGNOSIS — R293 Abnormal posture: Secondary | ICD-10-CM | POA: Insufficient documentation

## 2018-11-06 DIAGNOSIS — M62838 Other muscle spasm: Secondary | ICD-10-CM | POA: Insufficient documentation

## 2018-11-06 NOTE — Patient Instructions (Addendum)
Urge supression technique:  1) Take a deep breath to convince yourself that you are in control and calm the nervous system.  2) Do 5 "quick-flick" kegels (pelvic floor muscle contractions) and re-assess the urge. Repeat another set if urge is still present. 3) Once the urge has decreased, start walking calmly to the the bathroom. Stop and repeat steps 1 and 2 as many times as needed until you can successfully get to the toilet. 4) Only once seated, take a deep breath and allow the pelvic floor muscles to relax and allow for the urine to flow.    Do not be discouraged if you are not successful the first couple times, this is normal and it will take practice but remember that YOU are in control. Start by practicing this at home where you do not have to worry as much if there were to be an accident. Allowing yourself to get rushed or nervous puts the bladder back in control and will not allow the technique to work.     Do 2 sets of 15 tilts per day. Breathe in when you tilt forward (A) and out when you tuck under (B).    Relax all the way forward, tucking your hips under just slightly so you feel a stretch through your low back. Hold for 5 deep breaths, repeat 2-3 times, 1-2 times per day.  Pelvic Tilt With Pelvic Floor (Hook-Lying)        Lie with hips and knees bent. Squeeze pelvic floor and flatten low back while breathing out so that pelvis tilts. Repeat _10__ times. Do _1-2__ times a day.  Put a pillow under your hips in this position when doing this exercise to help decrease pressure in the pelvis when it feels "heavy".   Hold this position and take 5 deep belly breaths. Repeat 3 times, 1-2 times per day.     *keep your bottom leg straight and top knee bent, let your top arm rest on your side (different from picture).  Exhale as you rotate open, feeling a stretch through your back and side. Do 2 sets of 15 on each side. 1-2 times per day.

## 2018-11-06 NOTE — Therapy (Signed)
[place] MAIN [facility] SERVICES [place], [place], [address] Phone: [phone]   Fax:  [phone]  Physical Therapy Treatment  Patient Details  Name: [name] MRN: [id number] Date of Birth: [birth year] Referring Provider (PT): Karolee Ohs   Encounter Date: 11/06/2018  PT End of Session - 11/06/18 1609    Visit Number  8    Number of Visits  20    Date for PT Re-Evaluation  12/17/18    Authorization Type  Progress Note due on 10th visit (2-11)    Authorization - Visit Number  8    Authorization - Number of Visits  10    PT Start Time  1000    PT Stop Time  1100    PT Time Calculation (min)  60 min    Activity Tolerance  Patient tolerated treatment well;No increased pain    Behavior During Therapy  WFL for tasks assessed/performed       Past Medical History:  Diagnosis Date  . Albuminuria   . Arthritis   . Asthma   . Cancer ([place])    skin  . Cataract cortical, senile   . Diabetes mellitus without complication ([place])   . GERD (gastroesophageal reflux disease)   . Hemorrhoids   . History of kidney stones   . Hyperlipidemia   . Hypertension   . Lyme disease   . 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 OSA (obstructive sleep apnea)   . Osteoporosis   . Osteoporosis   . Reflux esophagitis   . Steatohepatitis   . Steatohepatitis     Past Surgical History:  Procedure Laterality Date  . ABDOMINAL HYSTERECTOMY    . APPENDECTOMY    . [place]  . CARDIAC CATHETERIZATION  08/13/2014   [facility]. no significant CAD, normal LVEDP.   Marland Kitchen CATARACT EXTRACTION    . CHOLECYSTECTOMY    . COLONOSCOPY    . COLONOSCOPY WITH PROPOFOL N/A 12/07/2016   Procedure: COLONOSCOPY WITH PROPOFOL;  Surgeon: Lollie Sails, MD;  Location: [facility] ENDOSCOPY;  Service: Endoscopy;  Laterality: N/A;  . ESOPHAGOGASTRODUODENOSCOPY    . ESOPHAGOGASTRODUODENOSCOPY (EGD) WITH PROPOFOL N/A 12/07/2016   Procedure: ESOPHAGOGASTRODUODENOSCOPY  (EGD) WITH PROPOFOL;  Surgeon: Lollie Sails, MD;  Location: [facility] ENDOSCOPY;  Service: Endoscopy;  Laterality: N/A;  . ESOPHAGOGASTRODUODENOSCOPY (EGD) WITH PROPOFOL N/A 01/07/2018   Procedure: ESOPHAGOGASTRODUODENOSCOPY (EGD) WITH PROPOFOL;  Surgeon: Lollie Sails, MD;  Location: [facility] ENDOSCOPY;  Service: Endoscopy;  Laterality: N/A;  . EYE SURGERY    . HEMORRHOID SURGERY    . PARTIAL HYSTERECTOMY    . TONSILLECTOMY      There were no vitals filed for this visit.    Pelvic Floor Physical Therapy Treatment Note  SCREENING  Changes in medications, allergies, or medical history?: no   SUBJECTIVE  Patient reports: She notices that her bladder is in a better position. She had one day of bowel issues again. Ate a small bowl of spaghetti and had loose bowels so she had a small amount of leakage thinking it was gas passing. Overall feels she is doing much better than she was.  Precautions:  osteoporosis  Pain update: No pain  Patient Goals: She would like to be able to return to water aerobics without fear of leakage of stool or urine.   OBJECTIVE  Changes in:  Range of Motion/Flexibilty:  ~ 45 degrees of L thoracolumbar rotation in hook-lying and ~ 30 degrees or  R rotation pre-treatment with pain referring to proximal adductors. Improved following treatment.  Palpation: TTP to L lumbar paraspinals and multifidus  INTERVENTIONS THIS SESSION: Manual: Performed overpressure into thoracolumbar rotation in side-lying as well as TP release to L lumbar paraspinals and multifidus to improve mobility and decrease pressure on nerve roots that lead to the pelvis for improved ability to recruit and coordinate PFM for decreased incontinence. Therex: Practiced posterior pelvic tilts with hips elevated and reviewed the purpose of the exercise to improve compliance and efficacy. Educated on and practiced butterfly adductor stretch to improve muscle and fascial length and decrease  referral of tightness internally for improved recruitment. Educated on and practiced bow-and arrow rotations to improve and maintain improvement in thoracolumbar mobility and decreased pressure on nerve roots.  Total time: 60 min.                          PT Education - 11/06/18 1608    Education Details  See Pt. Instructions and Interventions this session.    Person(s) Educated  Patient    Methods  Explanation;Tactile cues;Verbal cues;Handout    Comprehension  Verbalized understanding;Returned demonstration;Verbal cues required;Tactile cues required;Need further instruction       PT Short Term Goals - 10/08/18 1445      PT SHORT TERM GOAL #1   Title  Patient will demonstrate a coordinated contraction, relaxation, and bulge of the pelvic floor muscles to demonstrate functional recruitment and motion and allow for further strengthening.    Baseline  Pt. has little to no coordination or activation of the PFM with cueing.    Time  5    Period  Weeks    Status  New    Target Date  11/12/18      PT SHORT TERM GOAL #2   Title  Patient will demonstrate coordinated diaphragmatic breathing with pelvic tilts to demonstrate improved control of diaphragm and TA, to allow for further strengthening of core musculature and decreased pelvic floor spasm    Baseline  Pt. has mild prolapse of anterior vaginal wall and feels "bulge" occasionally, demonstrates breathing dysfunction.     Time  5    Period  Weeks    Status  New    Target Date  11/12/18      PT SHORT TERM GOAL #3   Title  Patient will demonstrate improved sitting and standing posture to demonstrate learning and decrease stress on the pelvic floor with functional activity.    Baseline  Posterior pelvic tilt, knees bent, buttock gripping    Time  5    Period  Weeks    Status  New    Target Date  11/12/18        PT Long Term Goals - 10/08/18 1449      PT LONG TERM GOAL #1   Title  Patient will report no  episodes of SUI over the course of the prior two weeks to demonstrate improved functional ability.    Baseline  Pt. wearing depends at night and #6 pads during the day, having leakage frequently with urge upon standing.    Time  10    Period  Weeks    Status  New    Target Date  12/17/18      PT LONG TERM GOAL #2   Title  Patient will score at or below 25/100 on the POPDI-6 and the CRADI-8 to demonstrate a clinically meaningful decrease in disability and  distress due to pelvic floor dysfunction.    Baseline  CRADI-8: 50/100, POPDI-6: 50/100    Time  10    Period  Weeks    Status  New    Target Date  12/20/18      PT LONG TERM GOAL #3   Title  Patient will report urinating 6-8 times per day over the course of the prior week to demonstrate decreased frequency.    Baseline  Has to urinate immediately upon standing after sitting for any length of time.    Time  10    Period  Weeks    Status  New    Target Date  12/17/18      PT LONG TERM GOAL #4   Title  Patient will report no episodes of Fecal Incontinence over the past two weeks to demonstrate improved function and quality of life and to allow her to participate in water aerobics as part of her occupation.    Baseline  She has had to stop going to water aerobics due to incotinence.    Time  10    Period  Weeks    Status  New    Target Date  12/17/18            Plan - 11/06/18 1609    Clinical Impression Statement  Pt. responded well to all interventions today, demonstrating improved lumbothoracic mobility, decreased LB spasm, and decreased pain in adductors following treatment as well as improved performance of her HEP exercises with good coordination and only min cueing verbally. Continue per POC.    Clinical Presentation  Evolving    Clinical Decision Making  High    Rehab Potential  Fair    Clinical Impairments Affecting Rehab Potential  Sleep Apnea, hemorrhoids, asthma, osteoporosis, partial hysterectomy from fibroids and  cervical lesions, obesity, as well as an appendectomy    PT Frequency  2x / week    PT Duration  Other (comment)    PT Treatment/Interventions  ADLs/Self Care Home Management;Biofeedback;Electrical Stimulation;Moist Heat;Traction;Gait training;Functional mobility training;Therapeutic activities;Therapeutic exercise;Balance training;Neuromuscular re-education;Manual techniques;Patient/family education;Scar mobilization;Passive range of motion;Dry needling;Taping;Spinal Manipulations;Joint Manipulations    PT Next Visit Plan   Review step-ups and add step-downs. Ask about pelvic tilts with hips elevated at home. manually re-check PFM coordination and strength, perform TP release to PFM.    PT Home Exercise Plan  diaphragmatic breathing, posterior pelvic tilts with hips elevated and kegel, low back stretch, seated posterior pelvic tilt, urge-suppression, bow-and-arrow, hook-lying butterfly stretch.       Patient will benefit from skilled therapeutic intervention in order to improve the following deficits and impairments:  Abnormal gait, Increased fascial restricitons, Impaired sensation, Improper body mechanics, Cardiopulmonary status limiting activity, Decreased coordination, Decreased mobility, Decreased scar mobility, Increased muscle spasms, Impaired tone, Postural dysfunction, Decreased endurance, Decreased range of motion, Decreased strength, Decreased balance, Difficulty walking, Obesity, Impaired flexibility  Visit Diagnosis: Other lack of coordination  Other muscle spasm  Abnormal posture  Muscle weakness (generalized)     Problem List Patient Active Problem List   Diagnosis Date Noted  . Diabetes mellitus type 2 in obese ([place]) 09/04/2017  . Bilateral lower extremity edema 09/04/2017  . Gastroesophageal reflux disease without esophagitis 05/16/2017  . Diarrhea 12/29/2016  . Enteritis 12/29/2016  . Acute lower UTI 12/29/2016  . Incomplete emptying of bladder 11/01/2016  . Urge  incontinence 07/14/2015  . Chronic renal failure, stage 3 (moderate) ([place]) 08/06/2014  . Renal mass, right 08/06/2014  . Precordial pain  07/24/2014  . Dyspnea 07/24/2014  . Palpitations 07/24/2014  . Essential hypertension 07/24/2014  . Hyperlipidemia 07/24/2014  . Bladder neoplasm of uncertain malignant potential 07/16/2014  . Gross hematuria 07/16/2014  . Asthma 05/17/2014  . Osteoporosis 05/17/2014  . Steatohepatitis 05/17/2014  . OSA on CPAP 05/17/2014   Willa Rough DPT, ATC Willa Rough 11/06/2018, 4:30 PM  [place] MAIN [facility] SERVICES [address] [place], [place], [address] Phone: [phone]   Fax:  [phone]  Name: PAIZLEE KINDER MRN: [id number] Date of Birth: May 03, 1941

## 2018-11-08 ENCOUNTER — Ambulatory Visit: Payer: Medicare Other

## 2018-11-08 DIAGNOSIS — M6281 Muscle weakness (generalized): Secondary | ICD-10-CM

## 2018-11-08 DIAGNOSIS — R278 Other lack of coordination: Secondary | ICD-10-CM | POA: Diagnosis not present

## 2018-11-08 DIAGNOSIS — M62838 Other muscle spasm: Secondary | ICD-10-CM

## 2018-11-08 DIAGNOSIS — R293 Abnormal posture: Secondary | ICD-10-CM

## 2018-11-08 NOTE — Therapy (Signed)
[place] MAIN [facility] SERVICES [place], [place], [address] Phone: [phone]   Fax:  [phone]  Physical Therapy Treatment  Patient Details  Name: [name] MRN: [id number] Date of Birth: 30-Aug-1941 Referring Provider (PT): Karolee Ohs   Encounter Date: 11/08/2018  PT End of Session - 11/08/18 1202    Visit Number  9    Number of Visits  20    Date for PT Re-Evaluation  12/17/18    Authorization Type  Progress Note due on 10th visit (2-11)    Authorization - Visit Number  9    Authorization - Number of Visits  10    PT Start Time  1050    PT Stop Time  1150    PT Time Calculation (min)  60 min    Activity Tolerance  Patient tolerated treatment well;No increased pain    Behavior During Therapy  WFL for tasks assessed/performed       Past Medical History:  Diagnosis Date  . Albuminuria   . Arthritis   . Asthma   . Cancer ([place])    skin  . Cataract cortical, senile   . Diabetes mellitus without complication ([place])   . GERD (gastroesophageal reflux disease)   . Hemorrhoids   . History of kidney stones   . Hyperlipidemia   . Hypertension   . Lyme disease   . 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 OSA (obstructive sleep apnea)   . Osteoporosis   . Osteoporosis   . Reflux esophagitis   . Steatohepatitis   . Steatohepatitis     Past Surgical History:  Procedure Laterality Date  . ABDOMINAL HYSTERECTOMY    . APPENDECTOMY    . [place]  . CARDIAC CATHETERIZATION  08/13/2014   [facility]. no significant CAD, normal LVEDP.   Marland Kitchen CATARACT EXTRACTION    . CHOLECYSTECTOMY    . COLONOSCOPY    . COLONOSCOPY WITH PROPOFOL N/A 12/07/2016   Procedure: COLONOSCOPY WITH PROPOFOL;  Surgeon: Lollie Sails, MD;  Location: [facility] ENDOSCOPY;  Service: Endoscopy;  Laterality: N/A;  . ESOPHAGOGASTRODUODENOSCOPY    . ESOPHAGOGASTRODUODENOSCOPY (EGD) WITH PROPOFOL N/A 12/07/2016   Procedure: ESOPHAGOGASTRODUODENOSCOPY  (EGD) WITH PROPOFOL;  Surgeon: Lollie Sails, MD;  Location: [facility] ENDOSCOPY;  Service: Endoscopy;  Laterality: N/A;  . ESOPHAGOGASTRODUODENOSCOPY (EGD) WITH PROPOFOL N/A 01/07/2018   Procedure: ESOPHAGOGASTRODUODENOSCOPY (EGD) WITH PROPOFOL;  Surgeon: Lollie Sails, MD;  Location: [facility] ENDOSCOPY;  Service: Endoscopy;  Laterality: N/A;  . EYE SURGERY    . HEMORRHOID SURGERY    . PARTIAL HYSTERECTOMY    . TONSILLECTOMY      There were no vitals filed for this visit.    Pelvic Floor Physical Therapy Treatment Note  SCREENING  Changes in medications, allergies, or medical history?: none    SUBJECTIVE  Patient reports: Has not had any leakage of urine or stool since las visit. Has been diligent with the urge suppression technique. Has been doing her exercises.  Precautions:  osteoporosis  Pain update: No pain  Patient Goals: She would like to be able to return to water aerobics without fear of leakage of stool or urine.   OBJECTIVE  Changes in:  Pelvic floor: Pt. Demonstrates 1/5 strength at beginning of session and 2+/5 following treatment. She has significant scarring at posterior fourchette and TTP greater through posterior fourchette and L coccygeus.    INTERVENTIONS THIS SESSION: Manual: Performed TP release and scar mobilization  through posterior fourchette and posterior PR/PC as well as L coccygeus to improve ROM of the PFM and allow for greater lift and squeeze. NM re-ed: Reviewed and further educated on recruitment and coordination of PFM squeeze and lift with tactile and verbal cueing to improve function and reduce incontinence.  Total time: 60 min.                          PT Education - 11/08/18 1202    Education Details  See Interventions this session.    Person(s) Educated  Patient    Methods  Explanation;Tactile cues;Verbal cues    Comprehension  Verbalized understanding;Returned demonstration;Verbal cues required;Tactile  cues required       PT Short Term Goals - 10/08/18 1445      PT SHORT TERM GOAL #1   Title  Patient will demonstrate a coordinated contraction, relaxation, and bulge of the pelvic floor muscles to demonstrate functional recruitment and motion and allow for further strengthening.    Baseline  Pt. has little to no coordination or activation of the PFM with cueing.    Time  5    Period  Weeks    Status  New    Target Date  11/12/18      PT SHORT TERM GOAL #2   Title  Patient will demonstrate coordinated diaphragmatic breathing with pelvic tilts to demonstrate improved control of diaphragm and TA, to allow for further strengthening of core musculature and decreased pelvic floor spasm    Baseline  Pt. has mild prolapse of anterior vaginal wall and feels "bulge" occasionally, demonstrates breathing dysfunction.     Time  5    Period  Weeks    Status  New    Target Date  11/12/18      PT SHORT TERM GOAL #3   Title  Patient will demonstrate improved sitting and standing posture to demonstrate learning and decrease stress on the pelvic floor with functional activity.    Baseline  Posterior pelvic tilt, knees bent, buttock gripping    Time  5    Period  Weeks    Status  New    Target Date  11/12/18        PT Long Term Goals - 10/08/18 1449      PT LONG TERM GOAL #1   Title  Patient will report no episodes of SUI over the course of the prior two weeks to demonstrate improved functional ability.    Baseline  Pt. wearing depends at night and #6 pads during the day, having leakage frequently with urge upon standing.    Time  10    Period  Weeks    Status  New    Target Date  12/17/18      PT LONG TERM GOAL #2   Title  Patient will score at or below 25/100 on the POPDI-6 and the CRADI-8 to demonstrate a clinically meaningful decrease in disability and distress due to pelvic floor dysfunction.    Baseline  CRADI-8: 50/100, POPDI-6: 50/100    Time  10    Period  Weeks    Status  New     Target Date  12/20/18      PT LONG TERM GOAL #3   Title  Patient will report urinating 6-8 times per day over the course of the prior week to demonstrate decreased frequency.    Baseline  Has to urinate immediately upon standing after sitting for  any length of time.    Time  10    Period  Weeks    Status  New    Target Date  12/17/18      PT LONG TERM GOAL #4   Title  Patient will report no episodes of Fecal Incontinence over the past two weeks to demonstrate improved function and quality of life and to allow her to participate in water aerobics as part of her occupation.    Baseline  She has had to stop going to water aerobics due to incotinence.    Time  10    Period  Weeks    Status  New    Target Date  12/17/18            Plan - 11/08/18 1203    Clinical Impression Statement  Pt. responded well to all interventions today, demonstrating an increase in recruitment from baseline (0/5) to 1/5 at start of session and even greater strength and coordination following today's treatment up to 2+/5 with greater motion, sell spasm, and decreased restriction of scar tissue near the perenium.    Clinical Presentation  Evolving    Clinical Decision Making  High    Rehab Potential  Fair    Clinical Impairments Affecting Rehab Potential  Sleep Apnea, hemorrhoids, asthma, osteoporosis, partial hysterectomy from fibroids and cervical lesions, obesity, as well as an appendectomy    PT Frequency  2x / week    PT Duration  Other (comment)   10 weeks   PT Treatment/Interventions  ADLs/Self Care Home Management;Biofeedback;Electrical Stimulation;Moist Heat;Traction;Gait training;Functional mobility training;Therapeutic activities;Therapeutic exercise;Balance training;Neuromuscular re-education;Manual techniques;Patient/family education;Scar mobilization;Passive range of motion;Dry needling;Taping;Spinal Manipulations;Joint Manipulations    PT Next Visit Plan   Review step-ups and add step-downs.  Ask about pelvic tilts with hips elevated at home. manually re-try biofeedback    PT Home Exercise Plan  diaphragmatic breathing, posterior pelvic tilts with hips elevated and kegel, low back stretch, seated posterior pelvic tilt, urge-suppression, bow-and-arrow, hook-lying butterfly stretch.    Consulted and Agree with Plan of Care  Patient       Patient will benefit from skilled therapeutic intervention in order to improve the following deficits and impairments:  Abnormal gait, Increased fascial restricitons, Impaired sensation, Improper body mechanics, Cardiopulmonary status limiting activity, Decreased coordination, Decreased mobility, Decreased scar mobility, Increased muscle spasms, Impaired tone, Postural dysfunction, Decreased endurance, Decreased range of motion, Decreased strength, Decreased balance, Difficulty walking, Obesity, Impaired flexibility  Visit Diagnosis: Other lack of coordination  Other muscle spasm  Abnormal posture  Muscle weakness (generalized)     Problem List Patient Active Problem List   Diagnosis Date Noted  . Diabetes mellitus type 2 in obese ([place]) 09/04/2017  . Bilateral lower extremity edema 09/04/2017  . Gastroesophageal reflux disease without esophagitis 05/16/2017  . Diarrhea 12/29/2016  . Enteritis 12/29/2016  . Acute lower UTI 12/29/2016  . Incomplete emptying of bladder 11/01/2016  . Urge incontinence 07/14/2015  . Chronic renal failure, stage 3 (moderate) ([place]) 08/06/2014  . Renal mass, right 08/06/2014  . Precordial pain 07/24/2014  . Dyspnea 07/24/2014  . Palpitations 07/24/2014  . Essential hypertension 07/24/2014  . Hyperlipidemia 07/24/2014  . Bladder neoplasm of uncertain malignant potential 07/16/2014  . Gross hematuria 07/16/2014  . Asthma 05/17/2014  . Osteoporosis 05/17/2014  . Steatohepatitis 05/17/2014  . OSA on CPAP 05/17/2014   Willa Rough DPT, ATC Willa Rough 11/08/2018, 12:23 PM  [place] MAIN REHAB SERVICES [address]  [place], [place], [address] Phone: [phone]   Fax:  [phone]  Name: [name] MRN: [id number] Date of Birth: 1941-05-04

## 2018-11-13 ENCOUNTER — Ambulatory Visit: Payer: Medicare Other

## 2018-11-13 DIAGNOSIS — R278 Other lack of coordination: Secondary | ICD-10-CM | POA: Diagnosis not present

## 2018-11-13 DIAGNOSIS — M6281 Muscle weakness (generalized): Secondary | ICD-10-CM

## 2018-11-13 DIAGNOSIS — M62838 Other muscle spasm: Secondary | ICD-10-CM

## 2018-11-13 DIAGNOSIS — R293 Abnormal posture: Secondary | ICD-10-CM

## 2018-11-13 NOTE — Patient Instructions (Signed)
Anterior Step-Up    Stand with _6__ inch step placed in A direction. Step onto step with right foot without pushing off with the ground foot. Finish with ground foot in touch balance on step and return _6__ times. _3__ sets _1-2__ times per day.   Anterior Step-Down    Stand with both feet on _6__ inch step. Step down in A direction with left foot, touching heel to the floor and return __6_ times. __3_ sets _1-2_ times per day.  Hip Abduction (Standing)    Stand with support. Squeeze pelvic floor and hold. Lift right leg out to side, keeping toe forward. Hold for _2__ seconds. Relax for _1__ seconds. Repeat __10_ times. Do _3__ sets  a day.  HIP Extension - Standing    Draw lower tummy muscle (TA) and pelvic floor in, raise and lift leg backward squeezing glutes. Keep knee straight or slightly bent. _10__ reps per set, _3__ sets per day. Hold onto a support as lightly as safely possible.     Hip Abduction: Side Leg Lift - Side-Lying    Lie on side. Draw lower tummy muscle (TA) and pelvic floor in, Lift top leg until you feel strong contraction of muscle on the side of the hip. Keep top leg straight with body, toes pointing forward. Do not let your hip roll back! Do _10__ reps per set, __3_ sets per day  Hip Extension:    Lying face down, raise leg just off floor squeezing glutes. Keep leg straight. Hold 1 count. Lower leg to floor. Do _10__ reps per set, __3_ sets for each leg. Optional: Place small pillow under abdomen if back becomes uncomfortable.

## 2018-11-13 NOTE — Therapy (Signed)
[place] MAIN [facility] SERVICES [place], [place], [address] Phone: [phone]   Fax:  [phone]  Physical Therapy Progress Note   Dates of reporting period  10/08/2018   to   11/13/2018   Patient Details  Name: [name] MRN: [id number] Date of Birth: [birth year] Referring Provider (PT): Karolee Ohs   Encounter Date: 11/13/2018  PT End of Session - 11/13/18 1133    Visit Number  10    Number of Visits  20    Date for PT Re-Evaluation  12/17/18    Authorization - Visit Number  10    Authorization - Number of Visits  10    Equipment Utilized During Treatment  Gait belt    Activity Tolerance  Patient tolerated treatment well;No increased pain    Behavior During Therapy  WFL for tasks assessed/performed       Past Medical History:  Diagnosis Date  . Albuminuria   . Arthritis   . Asthma   . Cancer ([place])    skin  . Cataract cortical, senile   . Diabetes mellitus without complication ([place])   . GERD (gastroesophageal reflux disease)   . Hemorrhoids   . History of kidney stones   . Hyperlipidemia   . Hypertension   . Lyme disease   . 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 OSA (obstructive sleep apnea)   . Osteoporosis   . Osteoporosis   . Reflux esophagitis   . Steatohepatitis   . Steatohepatitis     Past Surgical History:  Procedure Laterality Date  . ABDOMINAL HYSTERECTOMY    . APPENDECTOMY    . [place]  . CARDIAC CATHETERIZATION  08/13/2014   [facility]. no significant CAD, normal LVEDP.   Marland Kitchen CATARACT EXTRACTION    . CHOLECYSTECTOMY    . COLONOSCOPY    . COLONOSCOPY WITH PROPOFOL N/A 12/07/2016   Procedure: COLONOSCOPY WITH PROPOFOL;  Surgeon: Lollie Sails, MD;  Location: [facility] ENDOSCOPY;  Service: Endoscopy;  Laterality: N/A;  . ESOPHAGOGASTRODUODENOSCOPY    . ESOPHAGOGASTRODUODENOSCOPY (EGD) WITH PROPOFOL N/A 12/07/2016   Procedure: ESOPHAGOGASTRODUODENOSCOPY (EGD) WITH PROPOFOL;  Surgeon:  Lollie Sails, MD;  Location: [facility] ENDOSCOPY;  Service: Endoscopy;  Laterality: N/A;  . ESOPHAGOGASTRODUODENOSCOPY (EGD) WITH PROPOFOL N/A 01/07/2018   Procedure: ESOPHAGOGASTRODUODENOSCOPY (EGD) WITH PROPOFOL;  Surgeon: Lollie Sails, MD;  Location: [facility] ENDOSCOPY;  Service: Endoscopy;  Laterality: N/A;  . EYE SURGERY    . HEMORRHOID SURGERY    . PARTIAL HYSTERECTOMY    . TONSILLECTOMY      There were no vitals filed for this visit.    Pelvic Floor Physical Therapy Treatment and Progress Note  SCREENING  Changes in medications, allergies, or medical history?: no    SUBJECTIVE  Patient reports: Has still not had any leakage since last week. Had a lazy Sunday, ran a small fever but is doing better. She feels she is  Getting better every week, has firmer stools and no fecal leakage for 2 weeks.  Precautions:  osteoporosis  Pain update: No pain  Patient Goals: She would like to be able to return to water aerobics without fear of leakage of stool or urine.   OBJECTIVE  Changes in: Posture/Observations:  Forward head position, otherwise much improved postural awareness.   Abdominal:  Pt.demonstrates ability to appropriately engage deep-core with posterior pelvic tilt in seated with coordinated breathing to allow for improved posture and decreased prolapse/ diarrhea.  Gait  Analysis: Pt. Is using more rotation with AMB but has further room for improvement. She demonstrates longer strides and more upright posture, faster gait speed. Still lists minimally when holding coversation.  INTERVENTIONS THIS SESSION: Therex: Reviewed and practiced step-ups and step-downs with emphasis on using hip-hinge to engage glutes. Educated on and practiced standing hip ABD and EXT to further strengthen hip stabilizers and allow for improved PFM coordination.   Total time: 45 min.                            PT Short Term Goals - 11/13/18 1119      PT SHORT  TERM GOAL #1   Title  Patient will demonstrate a coordinated contraction, relaxation, and bulge of the pelvic floor muscles to demonstrate functional recruitment and motion and allow for further strengthening.    Baseline  Pt. has little to no coordination or activation of the PFM with cueing. As of 11/13/2018: she demonstrates weak but coordinated motion with minimal cueing    Time  5    Period  Weeks    Status  Achieved    Target Date  11/12/18      PT SHORT TERM GOAL #2   Title  Patient will demonstrate coordinated diaphragmatic breathing with pelvic tilts to demonstrate improved control of diaphragm and TA, to allow for further strengthening of core musculature and decreased pelvic floor spasm    Baseline  Pt. has mild prolapse of anterior vaginal wall and feels "bulge" occasionally, demonstrates breathing dysfunction.     Time  5    Period  Weeks    Status  Achieved    Target Date  11/12/18      PT SHORT TERM GOAL #3   Title  Patient will demonstrate improved sitting and standing posture to demonstrate learning and decrease stress on the pelvic floor with functional activity.    Baseline  Posterior pelvic tilt, knees bent, buttock gripping    Time  5    Period  Weeks    Status  Achieved    Target Date  11/12/18        PT Long Term Goals - 11/13/18 1122      PT LONG TERM GOAL #1   Title  Patient will report no episodes of SUI over the course of the prior two weeks to demonstrate improved functional ability.    Baseline  Pt. wearing depends at night and #6 pads during the day, having leakage frequently with urge upon standing. As of 11/13/2018: Has not had leakage in a week, have not yet made it two weeks straight.    Time  10    Period  Weeks    Status  On-going    Target Date  12/17/18      PT LONG TERM GOAL #2   Title  Patient will score at or below 25/100 on the POPDI-6 and the CRADI-8 to demonstrate a clinically meaningful decrease in disability and distress due to pelvic  floor dysfunction.    Baseline  CRADI-8: 50/100, POPDI-6: 50/100, As of 2/12: CRADI-8: 12/100, POPDI-6: 0/100  (PFDI:40/300, PFIQ: 57/300)    Time  10    Period  Weeks    Status  Achieved    Target Date  12/17/18      PT LONG TERM GOAL #3   Title  Patient will report urinating 6-8 times per day over the course of the prior week to demonstrate  decreased frequency.    Baseline  Has to urinate immediately upon standing after sitting for any length of time.    Status  Achieved    Target Date  12/17/18      PT LONG TERM GOAL #4   Title  Patient will report no episodes of Fecal Incontinence over the past two weeks to demonstrate improved function and quality of life and to allow her to participate in water aerobics as part of her occupation.    Baseline  She has had to stop going to water aerobics due to incotinence.     Time  10    Period  Weeks    Status  Achieved    Target Date  12/17/18      PT LONG TERM GOAL #5   Title  Patient will score at or below 20/300 on the PFDI and 25/200 on the PFIQ to demonstrate a clinically meaningful decrease in disability and distress due to pelvic floor dysfunction.    Baseline  As of 11/13/2018: PFDI:40/300, PFIQ: 57/300    Time  5    Period  Weeks    Status  New    Target Date  12/17/18            Plan - 11/13/18 1558    Clinical Impression Statement  Pt. is making great progress toward her functional goals, meeting all goals except having 2 weeks with no leakage of urine, which she is progressing well toward and is expected to be achieved with further strengthening and coordination training. She continues to have limited mobility through the spine and weakness around the hips that predispose her to the potential of symptoms recurring if she has a set-back in her activity levels and she will benefit from further PT to continue building this strength to prevent return of Sx. We will continue at 2x/week for 2-3 weeks and then decrease to 1x/week as  she demonstrated improved independence and continued symptom improvement.  Patient's condition has the potential to improve in response to therapy. Maximum improvement is yet to be obtained. The anticipated improvement is attainable and reasonable in a generally predictable time.     Clinical Presentation  Stable    Clinical Decision Making  High    Rehab Potential  Fair    Clinical Impairments Affecting Rehab Potential  Sleep Apnea, hemorrhoids, asthma, osteoporosis, partial hysterectomy from fibroids and cervical lesions, obesity, as well as an appendectomy    PT Frequency  2x / week    PT Duration  Other (comment)    PT Treatment/Interventions  ADLs/Self Care Home Management;Biofeedback;Electrical Stimulation;Moist Heat;Traction;Gait training;Functional mobility training;Therapeutic activities;Therapeutic exercise;Balance training;Neuromuscular re-education;Manual techniques;Patient/family education;Scar mobilization;Passive range of motion;Dry needling;Taping;Spinal Manipulations;Joint Manipulations    PT Next Visit Plan   Review hip ABD/EXT and step-up/step-downs. re-try biofeedback    PT Home Exercise Plan  diaphragmatic breathing, posterior pelvic tilts with hips elevated and kegel, low back stretch, seated posterior pelvic tilt, urge-suppression, bow-and-arrow, hook-lying butterfly stretch.    Consulted and Agree with Plan of Care  Patient       Patient will benefit from skilled therapeutic intervention in order to improve the following deficits and impairments:  Abnormal gait, Increased fascial restricitons, Impaired sensation, Improper body mechanics, Cardiopulmonary status limiting activity, Decreased coordination, Decreased mobility, Decreased scar mobility, Increased muscle spasms, Impaired tone, Postural dysfunction, Decreased endurance, Decreased range of motion, Decreased strength, Decreased balance, Difficulty walking, Obesity, Impaired flexibility  Visit Diagnosis: Other lack of  coordination  Other muscle spasm  Abnormal posture  Muscle weakness (generalized)     Problem List Patient Active Problem List   Diagnosis Date Noted  . Diabetes mellitus type 2 in obese ([place]) 09/04/2017  . Bilateral lower extremity edema 09/04/2017  . Gastroesophageal reflux disease without esophagitis 05/16/2017  . Diarrhea 12/29/2016  . Enteritis 12/29/2016  . Acute lower UTI 12/29/2016  . Incomplete emptying of bladder 11/01/2016  . Urge incontinence 07/14/2015  . Chronic renal failure, stage 3 (moderate) ([place]) 08/06/2014  . Renal mass, right 08/06/2014  . Precordial pain 07/24/2014  . Dyspnea 07/24/2014  . Palpitations 07/24/2014  . Essential hypertension 07/24/2014  . Hyperlipidemia 07/24/2014  . Bladder neoplasm of uncertain malignant potential 07/16/2014  . Gross hematuria 07/16/2014  . Asthma 05/17/2014  . Osteoporosis 05/17/2014  . Steatohepatitis 05/17/2014  . OSA on CPAP 05/17/2014   Willa Rough DPT, ATC Willa Rough 11/13/2018, 4:11 PM  [place] MAIN [facility] SERVICES [address] [place], [place], [address] Phone: [phone]   Fax:  [phone]  Name: LETANYA FROH MRN: [id number] Date of Birth: 1940-11-17

## 2018-11-15 ENCOUNTER — Ambulatory Visit: Payer: Medicare Other

## 2018-11-15 DIAGNOSIS — M6281 Muscle weakness (generalized): Secondary | ICD-10-CM

## 2018-11-15 DIAGNOSIS — R293 Abnormal posture: Secondary | ICD-10-CM

## 2018-11-15 DIAGNOSIS — R278 Other lack of coordination: Secondary | ICD-10-CM | POA: Diagnosis not present

## 2018-11-15 DIAGNOSIS — M62838 Other muscle spasm: Secondary | ICD-10-CM

## 2018-11-15 NOTE — Therapy (Signed)
[place] MAIN [facility] SERVICES [address] [place], [place], [address] Phone: [phone]   Fax:  [phone]  Physical Therapy Treatment  Patient Details  Name: [name] MRN: [id number] Date of Birth: 1941-02-07 Referring Provider (PT): Karolee Ohs   Encounter Date: 11/15/2018  PT End of Session - 11/15/18 1213    Visit Number  11    Number of Visits  20    Date for PT Re-Evaluation  12/17/18    Authorization Type  Progress Note due on 10th visit (3/17)    Authorization - Visit Number  1    Authorization - Number of Visits  10    PT Start Time  1100    PT Stop Time  1200    PT Time Calculation (min)  60 min    Activity Tolerance  Patient tolerated treatment well;No increased pain    Behavior During Therapy  WFL for tasks assessed/performed       Past Medical History:  Diagnosis Date  . Albuminuria   . Arthritis   . Asthma   . Cancer ([place])    skin  . Cataract cortical, senile   . Diabetes mellitus without complication ([place])   . GERD (gastroesophageal reflux disease)   . Hemorrhoids   . History of kidney stones   . Hyperlipidemia   . Hypertension   . Lyme disease   . 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 OSA (obstructive sleep apnea)   . Osteoporosis   . Osteoporosis   . Reflux esophagitis   . Steatohepatitis   . Steatohepatitis     Past Surgical History:  Procedure Laterality Date  . ABDOMINAL HYSTERECTOMY    . APPENDECTOMY    . [place]  . CARDIAC CATHETERIZATION  08/13/2014   [facility]. no significant CAD, normal LVEDP.   Marland Kitchen CATARACT EXTRACTION    . CHOLECYSTECTOMY    . COLONOSCOPY    . COLONOSCOPY WITH PROPOFOL N/A 12/07/2016   Procedure: COLONOSCOPY WITH PROPOFOL;  Surgeon: Lollie Sails, MD;  Location: [facility] ENDOSCOPY;  Service: Endoscopy;  Laterality: N/A;  . ESOPHAGOGASTRODUODENOSCOPY    . ESOPHAGOGASTRODUODENOSCOPY (EGD) WITH PROPOFOL N/A 12/07/2016   Procedure: ESOPHAGOGASTRODUODENOSCOPY  (EGD) WITH PROPOFOL;  Surgeon: Lollie Sails, MD;  Location: [facility] ENDOSCOPY;  Service: Endoscopy;  Laterality: N/A;  . ESOPHAGOGASTRODUODENOSCOPY (EGD) WITH PROPOFOL N/A 01/07/2018   Procedure: ESOPHAGOGASTRODUODENOSCOPY (EGD) WITH PROPOFOL;  Surgeon: Lollie Sails, MD;  Location: [facility] ENDOSCOPY;  Service: Endoscopy;  Laterality: N/A;  . EYE SURGERY    . HEMORRHOID SURGERY    . PARTIAL HYSTERECTOMY    . TONSILLECTOMY      There were no vitals filed for this visit.    Pelvic Floor Physical Therapy Treatment Note  SCREENING  Changes in medications, allergies, or medical history?: no   SUBJECTIVE  Patient reports: She is doing well, still no leakage. Is excited that she can squat down again.  Precautions:  osteoporosis  Pain update: No pain  Patient Goals: She would like to be able to return to water aerobics without fear of leakage of stool or urine.   OBJECTIVE  Changes in: Posture/Observations:  Hyperkyphotic, forward head  Pelvic floor: Demonstrates consistent coordination though no strength increase since prior check.  Abdominal:  Variable ability to recruit TA in [place]: Therex: Educated on and practiced TA in mod-quad with kegel to improve strength and integrate coordination of the two components of the deep-core  for improved function. NM re-ed: Reviewed coordination of TA with the PFM with decreased co-contraction of the glutes to allow for greater strengthening and less compensation. Biofeedback: used biofeedback to reaffirm consistent coordination of the PFM with breathing and TA without use of pelvic tilt for more functional recruitment of the PFM.  Total time: 60 min.                          PT Education - 11/15/18 1213    Education Details  See Pt. Instructions and Interventions this session    Person(s) Educated  Patient    Methods  Explanation;Demonstration;Verbal cues;Handout;Tactile  cues    Comprehension  Verbalized understanding;Returned demonstration;Verbal cues required;Tactile cues required;Need further instruction       PT Short Term Goals - 11/13/18 1119      PT SHORT TERM GOAL #1   Title  Patient will demonstrate a coordinated contraction, relaxation, and bulge of the pelvic floor muscles to demonstrate functional recruitment and motion and allow for further strengthening.    Baseline  Pt. has little to no coordination or activation of the PFM with cueing. As of 11/13/2018: she demonstrates weak but coordinated motion with minimal cueing    Time  5    Period  Weeks    Status  Achieved    Target Date  11/12/18      PT SHORT TERM GOAL #2   Title  Patient will demonstrate coordinated diaphragmatic breathing with pelvic tilts to demonstrate improved control of diaphragm and TA, to allow for further strengthening of core musculature and decreased pelvic floor spasm    Baseline  Pt. has mild prolapse of anterior vaginal wall and feels "bulge" occasionally, demonstrates breathing dysfunction.     Time  5    Period  Weeks    Status  Achieved    Target Date  11/12/18      PT SHORT TERM GOAL #3   Title  Patient will demonstrate improved sitting and standing posture to demonstrate learning and decrease stress on the pelvic floor with functional activity.    Baseline  Posterior pelvic tilt, knees bent, buttock gripping    Time  5    Period  Weeks    Status  Achieved    Target Date  11/12/18        PT Long Term Goals - 11/13/18 1122      PT LONG TERM GOAL #1   Title  Patient will report no episodes of SUI over the course of the prior two weeks to demonstrate improved functional ability.    Baseline  Pt. wearing depends at night and #6 pads during the day, having leakage frequently with urge upon standing. As of 11/13/2018: Has not had leakage in a week, have not yet made it two weeks straight.    Time  10    Period  Weeks    Status  On-going    Target Date   12/17/18      PT LONG TERM GOAL #2   Title  Patient will score at or below 25/100 on the POPDI-6 and the CRADI-8 to demonstrate a clinically meaningful decrease in disability and distress due to pelvic floor dysfunction.    Baseline  CRADI-8: 50/100, POPDI-6: 50/100, As of 2/12: CRADI-8: 12/100, POPDI-6: 0/100  (PFDI:40/300, PFIQ: 57/300)    Time  10    Period  Weeks    Status  Achieved    Target Date  12/17/18      PT LONG TERM GOAL #3   Title  Patient will report urinating 6-8 times per day over the course of the prior week to demonstrate decreased frequency.    Baseline  Has to urinate immediately upon standing after sitting for any length of time.    Status  Achieved    Target Date  12/17/18      PT LONG TERM GOAL #4   Title  Patient will report no episodes of Fecal Incontinence over the past two weeks to demonstrate improved function and quality of life and to allow her to participate in water aerobics as part of her occupation.    Baseline  She has had to stop going to water aerobics due to incotinence.     Time  10    Period  Weeks    Status  Achieved    Target Date  12/17/18      PT LONG TERM GOAL #5   Title  Patient will score at or below 20/300 on the PFDI and 25/200 on the PFIQ to demonstrate a clinically meaningful decrease in disability and distress due to pelvic floor dysfunction.    Baseline  As of 11/13/2018: PFDI:40/300, PFIQ: 57/300    Time  5    Period  Weeks    Status  New    Target Date  12/17/18            Plan - 11/15/18 1214    Clinical Impression Statement  Patient still has difficulty coordinating contracton in hook-lying position but was able to demonstrate 10 consecutive coordinated squeezes and releases with the first 4 demonstrating good strength and then tapering as her endurance ran out. She demonstrated appropriate performance of TA recruitment in mod-quad with PF squeeze to help further integrate the two muscles and allow for greater  strengthening. Continue per POC.    Clinical Presentation  Stable    Clinical Decision Making  High    Rehab Potential  Fair    Clinical Impairments Affecting Rehab Potential  Sleep Apnea, hemorrhoids, asthma, osteoporosis, partial hysterectomy from fibroids and cervical lesions, obesity, as well as an appendectomy    PT Frequency  2x / week    PT Duration  Other (comment)   10 weeks   PT Treatment/Interventions  ADLs/Self Care Home Management;Biofeedback;Electrical Stimulation;Moist Heat;Traction;Gait training;Functional mobility training;Therapeutic activities;Therapeutic exercise;Balance training;Neuromuscular re-education;Manual techniques;Patient/family education;Scar mobilization;Passive range of motion;Dry needling;Taping;Spinal Manipulations;Joint Manipulations    PT Next Visit Plan   Review hip ABD/EXT and step-up/step-downs as well as TA in mod-quad.    PT Home Exercise Plan  diaphragmatic breathing, posterior pelvic tilts with hips elevated and kegel, low back stretch, seated posterior pelvic tilt, urge-suppression, bow-and-arrow, hook-lying butterfly stretch.    Consulted and Agree with Plan of Care  Patient       Patient will benefit from skilled therapeutic intervention in order to improve the following deficits and impairments:  Abnormal gait, Increased fascial restricitons, Impaired sensation, Improper body mechanics, Cardiopulmonary status limiting activity, Decreased coordination, Decreased mobility, Decreased scar mobility, Increased muscle spasms, Impaired tone, Postural dysfunction, Decreased endurance, Decreased range of motion, Decreased strength, Decreased balance, Difficulty walking, Obesity, Impaired flexibility  Visit Diagnosis: Other lack of coordination  Other muscle spasm  Abnormal posture  Muscle weakness (generalized)     Problem List Patient Active Problem List   Diagnosis Date Noted  . Diabetes mellitus type 2 in obese ([place]) 09/04/2017  . Bilateral  lower extremity edema 09/04/2017  .  Gastroesophageal reflux disease without esophagitis 05/16/2017  . Diarrhea 12/29/2016  . Enteritis 12/29/2016  . Acute lower UTI 12/29/2016  . Incomplete emptying of bladder 11/01/2016  . Urge incontinence 07/14/2015  . Chronic renal failure, stage 3 (moderate) ([place]) 08/06/2014  . Renal mass, right 08/06/2014  . Precordial pain 07/24/2014  . Dyspnea 07/24/2014  . Palpitations 07/24/2014  . Essential hypertension 07/24/2014  . Hyperlipidemia 07/24/2014  . Bladder neoplasm of uncertain malignant potential 07/16/2014  . Gross hematuria 07/16/2014  . Asthma 05/17/2014  . Osteoporosis 05/17/2014  . Steatohepatitis 05/17/2014  . OSA on CPAP 05/17/2014   Willa Rough DPT, ATC Willa Rough 11/15/2018, 12:25 PM  [place] MAIN [facility] SERVICES [address] [place], [place], [address] Phone: [phone]   Fax:  [phone]  Name: [name] MRN: [id number] Date of Birth: 12/04/1940

## 2018-11-15 NOTE — Patient Instructions (Signed)
   Breathe in, let belly relax down toward the floor and then breathe out, pulling the lower belly in toward the backbone and do a kegel.  *You can do this standing with your hands on a chair or the bed rather than on all fours but try to make sure your back stays flat.  Repeat this _10x3__ times _1__ times per day

## 2018-11-20 ENCOUNTER — Encounter: Payer: Self-pay | Admitting: Physical Therapy

## 2018-11-20 ENCOUNTER — Ambulatory Visit: Payer: Medicare Other | Admitting: Physical Therapy

## 2018-11-20 DIAGNOSIS — R293 Abnormal posture: Secondary | ICD-10-CM

## 2018-11-20 DIAGNOSIS — M6281 Muscle weakness (generalized): Secondary | ICD-10-CM

## 2018-11-20 DIAGNOSIS — R278 Other lack of coordination: Secondary | ICD-10-CM

## 2018-11-20 DIAGNOSIS — M62838 Other muscle spasm: Secondary | ICD-10-CM

## 2018-11-20 NOTE — Therapy (Signed)
[place] MAIN [facility] SERVICES [place], [place], [address] Phone: [phone]   Fax:  [phone]  Physical Therapy Treatment  Patient Details  Name: [name] MRN: [id number] Date of Birth: 1941/05/15 Referring Provider (PT): Karolee Ohs   Encounter Date: 11/20/2018  PT End of Session - 11/20/18 1056    Visit Number  12    Number of Visits  20    Date for PT Re-Evaluation  12/17/18    Authorization Type  Progress Note due on 10th visit (3/17)    Authorization - Visit Number  2    Authorization - Number of Visits  10    PT Start Time  1100    PT Stop Time  1156    PT Time Calculation (min)  56 min    Activity Tolerance  Patient tolerated treatment well;No increased pain    Behavior During Therapy  WFL for tasks assessed/performed       Past Medical History:  Diagnosis Date  . Albuminuria   . Arthritis   . Asthma   . Cancer ([place])    skin  . Cataract cortical, senile   . Diabetes mellitus without complication ([place])   . GERD (gastroesophageal reflux disease)   . Hemorrhoids   . History of kidney stones   . Hyperlipidemia   . Hypertension   . Lyme disease   . 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 OSA (obstructive sleep apnea)   . Osteoporosis   . Osteoporosis   . Reflux esophagitis   . Steatohepatitis   . Steatohepatitis     Past Surgical History:  Procedure Laterality Date  . ABDOMINAL HYSTERECTOMY    . APPENDECTOMY    . [place]  . CARDIAC CATHETERIZATION  08/13/2014   [facility]. no significant CAD, normal LVEDP.   Marland Kitchen CATARACT EXTRACTION    . CHOLECYSTECTOMY    . COLONOSCOPY    . COLONOSCOPY WITH PROPOFOL N/A 12/07/2016   Procedure: COLONOSCOPY WITH PROPOFOL;  Surgeon: Lollie Sails, MD;  Location: [facility] ENDOSCOPY;  Service: Endoscopy;  Laterality: N/A;  . ESOPHAGOGASTRODUODENOSCOPY    . ESOPHAGOGASTRODUODENOSCOPY (EGD) WITH PROPOFOL N/A 12/07/2016   Procedure: ESOPHAGOGASTRODUODENOSCOPY  (EGD) WITH PROPOFOL;  Surgeon: Lollie Sails, MD;  Location: [facility] ENDOSCOPY;  Service: Endoscopy;  Laterality: N/A;  . ESOPHAGOGASTRODUODENOSCOPY (EGD) WITH PROPOFOL N/A 01/07/2018   Procedure: ESOPHAGOGASTRODUODENOSCOPY (EGD) WITH PROPOFOL;  Surgeon: Lollie Sails, MD;  Location: [facility] ENDOSCOPY;  Service: Endoscopy;  Laterality: N/A;  . EYE SURGERY    . HEMORRHOID SURGERY    . PARTIAL HYSTERECTOMY    . TONSILLECTOMY      There were no vitals filed for this visit.    Pelvic Floor Physical Therapy Treatment   SCREENING  Changes in medications, allergies, or medical history?: no    SUBJECTIVE  Patient reports: Patient states that she had a meal out on Friday and felt she got food poisoning from it which lasted through the weekend. Patient had diarrhea over the weekend as well as stomach cramping. She states that her blood pressure and temperature dropped. Patient denies any fever today. She had chest pain as well; but denies any currently. She has taken her BP medications this morning and feels things are settling down, but she reports still feeling weak and not fully recovered from the food poisoning.   Vitals:  BP in sitting: 160/53 HR: 58 bpm SpO2: 97%  Precautions:  osteoporosis  Pain update:  No pain   Patient Goals: She would like to be able to return to water aerobics without fear of leakage of stool or urine.   OBJECTIVE  Changes in: Posture/Observations:   Patient standing and sitting more upright with even weight bearing through feet. Patient continues to have some forward head posture.  Abdominal:  Pt.demonstrates continued ability to appropriately engage deep-core with posterior pelvic tilt in seated with coordinated breathing to allow for improved posture and decreased prolapse/ diarrhea. Patient also able to achieve increased/more isolated anterior tilt in sitting with inhalations.  Gait Analysis: Patient continues to take longer more powerful  strides during gait with minimal/moderate pelvic rotation. Patient able to maintain straight path during dual task ambulation.  INTERVENTIONS THIS SESSION: Therapeutic exercise:  Pelvic tilts in sitting with diaphragmatic breath coordination x4 min. Standing hip extension, x17 BLE, VCs and TCs to control posture and hips  Standing hip aBduction, x20 BLE  Vitals in sitting: HR: 58 bpm, SpO2: 99% Standing forward lunge, BLE x20, VCs and TCs for full foot contact and exhalation on exertion for optimal PFM engagement with gradient changes. Patient required increased rest breaks 2/2 to fatigue as a result of gastrointestinal distress over the weekend.    Patient educated throughout session on appropriate technique and form using multi-modal cueing, HEP, and activity modification. Patient articulated understanding and returned demonstration.  Total time: 56 min.   ASSESSMENT Patient presents to clinic with excellent motivation to participate in therapy despite having decreased energy levels 2/2 to gastrointestinal distress over the weekend. Patient demonstrates deficits in spinal mobility, strength, and cooridnation as evidenced by dependence on multi-modal cueing for initial reps of postural exercises including seated pelvic tilts. Patient able to achieve anterior pelvic tilt and controlled hip extension without postural compensations during today's session and responded positively to standing therapeutic exercise interventions. Patient will benefit from continued skilled therapeutic intervention to address remaining deficits in strength, coordination, posture, and mobility in order to decrease the incidence of incontinence, increase function, and improve overall QOL.     PT Short Term Goals - 11/13/18 1119      PT SHORT TERM GOAL #1   Title  Patient will demonstrate a coordinated contraction, relaxation, and bulge of the pelvic floor muscles to demonstrate functional recruitment and motion and  allow for further strengthening.    Baseline  Pt. has little to no coordination or activation of the PFM with cueing. As of 11/13/2018: she demonstrates weak but coordinated motion with minimal cueing    Time  5    Period  Weeks    Status  Achieved    Target Date  11/12/18      PT SHORT TERM GOAL #2   Title  Patient will demonstrate coordinated diaphragmatic breathing with pelvic tilts to demonstrate improved control of diaphragm and TA, to allow for further strengthening of core musculature and decreased pelvic floor spasm    Baseline  Pt. has mild prolapse of anterior vaginal wall and feels "bulge" occasionally, demonstrates breathing dysfunction.     Time  5    Period  Weeks    Status  Achieved    Target Date  11/12/18      PT SHORT TERM GOAL #3   Title  Patient will demonstrate improved sitting and standing posture to demonstrate learning and decrease stress on the pelvic floor with functional activity.    Baseline  Posterior pelvic tilt, knees bent, buttock gripping    Time  5    Period  Weeks    Status  Achieved    Target Date  11/12/18        PT Long Term Goals - 11/13/18 1122      PT LONG TERM GOAL #1   Title  Patient will report no episodes of SUI over the course of the prior two weeks to demonstrate improved functional ability.    Baseline  Pt. wearing depends at night and #6 pads during the day, having leakage frequently with urge upon standing. As of 11/13/2018: Has not had leakage in a week, have not yet made it two weeks straight.    Time  10    Period  Weeks    Status  On-going    Target Date  12/17/18      PT LONG TERM GOAL #2   Title  Patient will score at or below 25/100 on the POPDI-6 and the CRADI-8 to demonstrate a clinically meaningful decrease in disability and distress due to pelvic floor dysfunction.    Baseline  CRADI-8: 50/100, POPDI-6: 50/100, As of 2/12: CRADI-8: 12/100, POPDI-6: 0/100  (PFDI:40/300, PFIQ: 57/300)    Time  10    Period  Weeks     Status  Achieved    Target Date  12/17/18      PT LONG TERM GOAL #3   Title  Patient will report urinating 6-8 times per day over the course of the prior week to demonstrate decreased frequency.    Baseline  Has to urinate immediately upon standing after sitting for any length of time.    Status  Achieved    Target Date  12/17/18      PT LONG TERM GOAL #4   Title  Patient will report no episodes of Fecal Incontinence over the past two weeks to demonstrate improved function and quality of life and to allow her to participate in water aerobics as part of her occupation.    Baseline  She has had to stop going to water aerobics due to incotinence.     Time  10    Period  Weeks    Status  Achieved    Target Date  12/17/18      PT LONG TERM GOAL #5   Title  Patient will score at or below 20/300 on the PFDI and 25/200 on the PFIQ to demonstrate a clinically meaningful decrease in disability and distress due to pelvic floor dysfunction.    Baseline  As of 11/13/2018: PFDI:40/300, PFIQ: 57/300    Time  5    Period  Weeks    Status  New    Target Date  12/17/18            Plan - 11/20/18 1208    Clinical Impression Statement  Patient presents to clinic with excellent motivation to participate in therapy despite having decreased energy levels 2/2 to gastrointestinal distress over the weekend. Patient demonstrates deficits in spinal mobility, strength, and cooridnation as evidenced by dependence on multi-modal cueing for initial reps of postural exercises including seated pelvic tilts. Patient able to achieve anterior pelvic tilt and controlled hip extension without postural compensations during today's session and responded positively to standing therapeutic exercise interventions. Patient will benefit from continued skilled therapeutic intervention to address remaining deficits in strength, coordination, posture, and mobility in order to decrease the incidence of incontinence, increase  function, and improve overall QOL.    Rehab Potential  Fair    Clinical Impairments Affecting Rehab Potential  Sleep Apnea, hemorrhoids,  asthma, osteoporosis, partial hysterectomy from fibroids and cervical lesions, obesity, as well as an appendectomy    PT Frequency  2x / week    PT Duration  Other (comment)   10 weeks   PT Treatment/Interventions  ADLs/Self Care Home Management;Biofeedback;Electrical Stimulation;Moist Heat;Traction;Gait training;Functional mobility training;Therapeutic activities;Therapeutic exercise;Balance training;Neuromuscular re-education;Manual techniques;Patient/family education;Scar mobilization;Passive range of motion;Dry needling;Taping;Spinal Manipulations;Joint Manipulations    PT Next Visit Plan   Review hip ABD/EXT and step-up/step-downs as well as TA in mod-quad.    PT Home Exercise Plan  diaphragmatic breathing, posterior pelvic tilts with hips elevated and kegel, low back stretch, seated posterior pelvic tilt, urge-suppression, bow-and-arrow, hook-lying butterfly stretch.    Consulted and Agree with Plan of Care  Patient       Patient will benefit from skilled therapeutic intervention in order to improve the following deficits and impairments:  Abnormal gait, Increased fascial restricitons, Impaired sensation, Improper body mechanics, Cardiopulmonary status limiting activity, Decreased coordination, Decreased mobility, Decreased scar mobility, Increased muscle spasms, Impaired tone, Postural dysfunction, Decreased endurance, Decreased range of motion, Decreased strength, Decreased balance, Difficulty walking, Obesity, Impaired flexibility  Visit Diagnosis: Other lack of coordination  Other muscle spasm  Abnormal posture  Muscle weakness (generalized)     Problem List Patient Active Problem List   Diagnosis Date Noted  . Diabetes mellitus type 2 in obese ([place]) 09/04/2017  . Bilateral lower extremity edema 09/04/2017  . Gastroesophageal reflux disease  without esophagitis 05/16/2017  . Diarrhea 12/29/2016  . Enteritis 12/29/2016  . Acute lower UTI 12/29/2016  . Incomplete emptying of bladder 11/01/2016  . Urge incontinence 07/14/2015  . Chronic renal failure, stage 3 (moderate) ([place]) 08/06/2014  . Renal mass, right 08/06/2014  . Precordial pain 07/24/2014  . Dyspnea 07/24/2014  . Palpitations 07/24/2014  . Essential hypertension 07/24/2014  . Hyperlipidemia 07/24/2014  . Bladder neoplasm of uncertain malignant potential 07/16/2014  . Gross hematuria 07/16/2014  . Asthma 05/17/2014  . Osteoporosis 05/17/2014  . Steatohepatitis 05/17/2014  . OSA on CPAP 05/17/2014   Myles Gip PT, DPT [phone] 11/20/2018, 12:09 PM  [place] MAIN [facility] SERVICES [address] [place], [place], [address] Phone: [phone]   Fax:  [phone]  Name: [name] MRN: [id number] Date of Birth: 1941/07/31

## 2018-11-22 ENCOUNTER — Ambulatory Visit: Payer: Medicare Other

## 2018-11-27 ENCOUNTER — Ambulatory Visit: Payer: Medicare Other

## 2018-11-27 DIAGNOSIS — R278 Other lack of coordination: Secondary | ICD-10-CM | POA: Diagnosis not present

## 2018-11-27 DIAGNOSIS — M6281 Muscle weakness (generalized): Secondary | ICD-10-CM

## 2018-11-27 DIAGNOSIS — M62838 Other muscle spasm: Secondary | ICD-10-CM

## 2018-11-27 DIAGNOSIS — R293 Abnormal posture: Secondary | ICD-10-CM

## 2018-11-27 NOTE — Therapy (Signed)
[place] MAIN [facility] SERVICES [address] [place], [place], [address] Phone: [phone]   Fax:  [phone]  Physical Therapy Treatment  Patient Details  Name: [name] MRN: [id number] Date of Birth: 24-Feb-1941 Referring Provider (PT): Karolee Ohs   Encounter Date: 11/27/2018    Past Medical History:  Diagnosis Date  . Albuminuria   . Arthritis   . Asthma   . Cancer ([place])    skin  . Cataract cortical, senile   . Diabetes mellitus without complication ([place])   . GERD (gastroesophageal reflux disease)   . Hemorrhoids   . History of kidney stones   . Hyperlipidemia   . Hypertension   . Lyme disease   . 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 OSA (obstructive sleep apnea)   . Osteoporosis   . Osteoporosis   . Reflux esophagitis   . Steatohepatitis   . Steatohepatitis     Past Surgical History:  Procedure Laterality Date  . ABDOMINAL HYSTERECTOMY    . APPENDECTOMY    . [place]  . CARDIAC CATHETERIZATION  08/13/2014   [facility]. no significant CAD, normal LVEDP.   Marland Kitchen CATARACT EXTRACTION    . CHOLECYSTECTOMY    . COLONOSCOPY    . COLONOSCOPY WITH PROPOFOL N/A 12/07/2016   Procedure: COLONOSCOPY WITH PROPOFOL;  Surgeon: Lollie Sails, MD;  Location: [facility] ENDOSCOPY;  Service: Endoscopy;  Laterality: N/A;  . ESOPHAGOGASTRODUODENOSCOPY    . ESOPHAGOGASTRODUODENOSCOPY (EGD) WITH PROPOFOL N/A 12/07/2016   Procedure: ESOPHAGOGASTRODUODENOSCOPY (EGD) WITH PROPOFOL;  Surgeon: Lollie Sails, MD;  Location: [facility] ENDOSCOPY;  Service: Endoscopy;  Laterality: N/A;  . ESOPHAGOGASTRODUODENOSCOPY (EGD) WITH PROPOFOL N/A 01/07/2018   Procedure: ESOPHAGOGASTRODUODENOSCOPY (EGD) WITH PROPOFOL;  Surgeon: Lollie Sails, MD;  Location: [facility] ENDOSCOPY;  Service: Endoscopy;  Laterality: N/A;  . EYE SURGERY    . HEMORRHOID SURGERY    . PARTIAL HYSTERECTOMY    . TONSILLECTOMY      There were no vitals filed for this visit.     Pelvic Floor Physical Therapy Treatment Note  SCREENING  Changes in medications, allergies, or medical history?: none    SUBJECTIVE  Patient reports: Has not had any fecal or urinary incontinence. 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   Precautions:  osteoporosis  Pain update: Gets occasional pain in her R hip that she says is from "dry socket".  That comes and goes.  Patient Goals: She would like to be able to return to water aerobics without fear of leakage of stool or urine.   OBJECTIVE  Changes in: Posture/Observations:  Hyperkyphosis  Range of Motion/Flexibilty:  Pt. Demonstrates coordinated pelvic tilt in seated but continues to have very little ROM.  INTERVENTIONS THIS SESSION: Therex: Reviewed and practiced step-ups and step downs with maximal verbal, tactile, and visual cueing for glute and deep-core recruitment and improved coordination.   Total time: 60 min.                            PT Short Term Goals - 11/13/18 1119      PT SHORT TERM GOAL #1   Title  Patient will demonstrate a coordinated contraction, relaxation, and bulge of the pelvic floor muscles to demonstrate functional recruitment and motion and allow for further strengthening.    Baseline  Pt. has little to no coordination or activation of  the PFM with cueing. As of 11/13/2018: she demonstrates weak but coordinated motion with minimal cueing    Time  5    Period  Weeks    Status  Achieved    Target Date  11/12/18      PT SHORT TERM GOAL #2   Title  Patient will demonstrate coordinated diaphragmatic breathing with pelvic tilts to demonstrate improved control of diaphragm and TA, to allow for further strengthening of core musculature and decreased pelvic floor spasm    Baseline  Pt. has mild prolapse of anterior vaginal wall and feels "bulge" occasionally, demonstrates breathing dysfunction.     Time  5    Period  Weeks     Status  Achieved    Target Date  11/12/18      PT SHORT TERM GOAL #3   Title  Patient will demonstrate improved sitting and standing posture to demonstrate learning and decrease stress on the pelvic floor with functional activity.    Baseline  Posterior pelvic tilt, knees bent, buttock gripping    Time  5    Period  Weeks    Status  Achieved    Target Date  11/12/18        PT Long Term Goals - 11/13/18 1122      PT LONG TERM GOAL #1   Title  Patient will report no episodes of SUI over the course of the prior two weeks to demonstrate improved functional ability.    Baseline  Pt. wearing depends at night and #6 pads during the day, having leakage frequently with urge upon standing. As of 11/13/2018: Has not had leakage in a week, have not yet made it two weeks straight.    Time  10    Period  Weeks    Status  On-going    Target Date  12/17/18      PT LONG TERM GOAL #2   Title  Patient will score at or below 25/100 on the POPDI-6 and the CRADI-8 to demonstrate a clinically meaningful decrease in disability and distress due to pelvic floor dysfunction.    Baseline  CRADI-8: 50/100, POPDI-6: 50/100, As of 2/12: CRADI-8: 12/100, POPDI-6: 0/100  (PFDI:40/300, PFIQ: 57/300)    Time  10    Period  Weeks    Status  Achieved    Target Date  12/17/18      PT LONG TERM GOAL #3   Title  Patient will report urinating 6-8 times per day over the course of the prior week to demonstrate decreased frequency.    Baseline  Has to urinate immediately upon standing after sitting for any length of time.    Status  Achieved    Target Date  12/17/18      PT LONG TERM GOAL #4   Title  Patient will report no episodes of Fecal Incontinence over the past two weeks to demonstrate improved function and quality of life and to allow her to participate in water aerobics as part of her occupation.    Baseline  She has had to stop going to water aerobics due to incotinence.     Time  10    Period  Weeks     Status  Achieved    Target Date  12/17/18      PT LONG TERM GOAL #5   Title  Patient will score at or below 20/300 on the PFDI and 25/200 on the PFIQ to demonstrate a clinically meaningful decrease in disability and  distress due to pelvic floor dysfunction.    Baseline  As of 11/13/2018: PFDI:40/300, PFIQ: 57/300    Time  5    Period  Weeks    Status  New    Target Date  12/17/18            Plan - 11/28/18 1438    Clinical Impression Statement  Pt responded well to all interventions today, demonstrating improved coordination and strength with step-ups requiring less cueing and being able to perform without the use of handrails by the end of the session. She demonstrated improved endurance and coordination with step downs but continues to require maximal cueing and will need review at follow up sessions for maximal performance. Continue per POC.    Clinical Presentation  Stable    Clinical Decision Making  High    Rehab Potential  Fair    Clinical Impairments Affecting Rehab Potential  Sleep Apnea, hemorrhoids, asthma, osteoporosis, partial hysterectomy from fibroids and cervical lesions, obesity, as well as an appendectomy    PT Frequency  2x / week    PT Duration  Other (comment)   10 weeks   PT Treatment/Interventions  ADLs/Self Care Home Management;Biofeedback;Electrical Stimulation;Moist Heat;Traction;Gait training;Functional mobility training;Therapeutic activities;Therapeutic exercise;Balance training;Neuromuscular re-education;Manual techniques;Patient/family education;Scar mobilization;Passive range of motion;Dry needling;Taping;Spinal Manipulations;Joint Manipulations    PT Next Visit Plan   Manual for kyphosis and TP release around R hip. Review hip ABD/EXT and step-up/step-downs as well as TA in mod-quad.    PT Home Exercise Plan  diaphragmatic breathing, posterior pelvic tilts with hips elevated and kegel, low back stretch, seated posterior pelvic tilt, urge-suppression,  bow-and-arrow, hook-lying butterfly stretch.    Consulted and Agree with Plan of Care  Patient       Patient will benefit from skilled therapeutic intervention in order to improve the following deficits and impairments:  Abnormal gait, Increased fascial restricitons, Impaired sensation, Improper body mechanics, Cardiopulmonary status limiting activity, Decreased coordination, Decreased mobility, Decreased scar mobility, Increased muscle spasms, Impaired tone, Postural dysfunction, Decreased endurance, Decreased range of motion, Decreased strength, Decreased balance, Difficulty walking, Obesity, Impaired flexibility  Visit Diagnosis: Other lack of coordination  Other muscle spasm  Abnormal posture  Muscle weakness (generalized)     Problem List Patient Active Problem List   Diagnosis Date Noted  . Diabetes mellitus type 2 in obese ([place]) 09/04/2017  . Bilateral lower extremity edema 09/04/2017  . Gastroesophageal reflux disease without esophagitis 05/16/2017  . Diarrhea 12/29/2016  . Enteritis 12/29/2016  . Acute lower UTI 12/29/2016  . Incomplete emptying of bladder 11/01/2016  . Urge incontinence 07/14/2015  . Chronic renal failure, stage 3 (moderate) ([place]) 08/06/2014  . Renal mass, right 08/06/2014  . Precordial pain 07/24/2014  . Dyspnea 07/24/2014  . Palpitations 07/24/2014  . Essential hypertension 07/24/2014  . Hyperlipidemia 07/24/2014  . Bladder neoplasm of uncertain malignant potential 07/16/2014  . Gross hematuria 07/16/2014  . Asthma 05/17/2014  . Osteoporosis 05/17/2014  . Steatohepatitis 05/17/2014  . OSA on CPAP 05/17/2014   Willa Rough DPT, ATC Willa Rough 11/28/2018, 2:43 PM  [place] MAIN [facility] SERVICES [address] [place], [place], [address] Phone: [phone]   Fax:  [phone]  Name: [name] MRN: [id number] Date of Birth: [birth year]

## 2018-11-29 ENCOUNTER — Ambulatory Visit: Payer: Medicare Other

## 2018-12-02 ENCOUNTER — Ambulatory Visit: Payer: Medicare Other

## 2018-12-04 ENCOUNTER — Ambulatory Visit: Payer: Medicare Other

## 2018-12-10 ENCOUNTER — Ambulatory Visit: Payer: Medicare Other | Attending: Urology | Admitting: Physical Therapy

## 2018-12-10 ENCOUNTER — Encounter: Payer: Self-pay | Admitting: Physical Therapy

## 2018-12-10 VITALS — BP 183/54 | HR 56 | Temp 98.7°F

## 2018-12-10 DIAGNOSIS — R278 Other lack of coordination: Secondary | ICD-10-CM | POA: Diagnosis not present

## 2018-12-10 DIAGNOSIS — M6281 Muscle weakness (generalized): Secondary | ICD-10-CM | POA: Insufficient documentation

## 2018-12-10 DIAGNOSIS — R293 Abnormal posture: Secondary | ICD-10-CM

## 2018-12-10 DIAGNOSIS — M62838 Other muscle spasm: Secondary | ICD-10-CM | POA: Diagnosis present

## 2018-12-10 NOTE — Therapy (Signed)
[place] MAIN [facility] SERVICES [place], [place], [address] Phone: [phone]   Fax:  [phone]  Physical Therapy Treatment  Patient Details  Name: [name] MRN: [id number] Date of Birth: Jun 22, 1941 Referring Provider (PT): Karolee Ohs   Encounter Date: 12/10/2018  PT End of Session - 12/10/18 1000    Visit Number  13    Number of Visits  20    Date for PT Re-Evaluation  12/17/18    Authorization Type  Progress Note due on 10th visit (3/17)    Authorization - Visit Number  3    Authorization - Number of Visits  10    PT Start Time  0908    PT Stop Time  0933    PT Time Calculation (min)  25 min    Activity Tolerance  No increased pain;Patient limited by fatigue;Patient limited by lethargy    Behavior During Therapy  [facility] for tasks assessed/performed       Past Medical History:  Diagnosis Date  . Albuminuria   . Arthritis   . Asthma   . Cancer ([place])    skin  . Cataract cortical, senile   . Diabetes mellitus without complication ([place])   . GERD (gastroesophageal reflux disease)   . Hemorrhoids   . History of kidney stones   . Hyperlipidemia   . Hypertension   . Lyme disease   . 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 OSA (obstructive sleep apnea)   . Osteoporosis   . Osteoporosis   . Reflux esophagitis   . Steatohepatitis   . Steatohepatitis     Past Surgical History:  Procedure Laterality Date  . ABDOMINAL HYSTERECTOMY    . APPENDECTOMY    . [place]  . CARDIAC CATHETERIZATION  08/13/2014   [facility]. no significant CAD, normal LVEDP.   Marland Kitchen CATARACT EXTRACTION    . CHOLECYSTECTOMY    . COLONOSCOPY    . COLONOSCOPY WITH PROPOFOL N/A 12/07/2016   Procedure: COLONOSCOPY WITH PROPOFOL;  Surgeon: Lollie Sails, MD;  Location: [facility] ENDOSCOPY;  Service: Endoscopy;  Laterality: N/A;  . ESOPHAGOGASTRODUODENOSCOPY    . ESOPHAGOGASTRODUODENOSCOPY (EGD) WITH PROPOFOL N/A 12/07/2016   Procedure:  ESOPHAGOGASTRODUODENOSCOPY (EGD) WITH PROPOFOL;  Surgeon: Lollie Sails, MD;  Location: [facility] ENDOSCOPY;  Service: Endoscopy;  Laterality: N/A;  . ESOPHAGOGASTRODUODENOSCOPY (EGD) WITH PROPOFOL N/A 01/07/2018   Procedure: ESOPHAGOGASTRODUODENOSCOPY (EGD) WITH PROPOFOL;  Surgeon: Lollie Sails, MD;  Location: [facility] ENDOSCOPY;  Service: Endoscopy;  Laterality: N/A;  . EYE SURGERY    . HEMORRHOID SURGERY    . PARTIAL HYSTERECTOMY    . TONSILLECTOMY      Vitals:   12/10/18 1007  BP: (!) 183/54  Pulse: (!) 56  Temp: 98.7 F (37.1 C)  TempSrc: Oral  SpO2: 97%    Subjective Assessment - 12/10/18 1003    Subjective  Patient presents to clinic with complaints of malaise and weakness. She reports that she continues to have coughing and weakness from her cold/upper respiratory infection which started nearly two weeks ago. She states that she has completed two rounds of antibiotics, but continues to be ill. Her husband is also havign the same symptoms per her report. She chiefly complains of shortness of breath, weakness, cough, head and chest congestion, diarrhea, and decreased tolerance to activity. She went to [place] yesterday to do some light shopping. When she returned home, she said she spent the rest of the day in  bed feeling weak and tired. She was concerned she might be having a heart attack. With the general weakness and increased coughing, she has increased episodes of incontinence (both urinary and fecal) likely due to the increased demand on the diaphragm, PFM, and abdominal wall for management of IAP. Patient reports that she has taken her BP medication today at 630A.        TREATMENT  Self-Care and Home Management: Patient was educated to wear a surgical mask (provided by clinic), perform frequent and thorough hand hygiene, and limit visits to crowded public spaces. Patient acknowledged understanding and returned explanation. Patient was also educated on the importance of quality  rest and recovery in order to optimize the benefits from her physcial therapy sessions and promote overall health.  PT called PCP Jeanie Cooks, MD) to convey presenting symptoms and coordinate an appointment for the patient with Dr. Darcey Nora PA: [place]ond for today (12/10/2018) at 11A.     PT Short Term Goals - 11/13/18 1119      PT SHORT TERM GOAL #1   Title  Patient will demonstrate a coordinated contraction, relaxation, and bulge of the pelvic floor muscles to demonstrate functional recruitment and motion and allow for further strengthening.    Baseline  Pt. has little to no coordination or activation of the PFM with cueing. As of 11/13/2018: she demonstrates weak but coordinated motion with minimal cueing    Time  5    Period  Weeks    Status  Achieved    Target Date  11/12/18      PT SHORT TERM GOAL #2   Title  Patient will demonstrate coordinated diaphragmatic breathing with pelvic tilts to demonstrate improved control of diaphragm and TA, to allow for further strengthening of core musculature and decreased pelvic floor spasm    Baseline  Pt. has mild prolapse of anterior vaginal wall and feels "bulge" occasionally, demonstrates breathing dysfunction.     Time  5    Period  Weeks    Status  Achieved    Target Date  11/12/18      PT SHORT TERM GOAL #3   Title  Patient will demonstrate improved sitting and standing posture to demonstrate learning and decrease stress on the pelvic floor with functional activity.    Baseline  Posterior pelvic tilt, knees bent, buttock gripping    Time  5    Period  Weeks    Status  Achieved    Target Date  11/12/18        PT Long Term Goals - 11/13/18 1122      PT LONG TERM GOAL #1   Title  Patient will report no episodes of SUI over the course of the prior two weeks to demonstrate improved functional ability.    Baseline  Pt. wearing depends at night and #6 pads during the day, having leakage frequently with urge upon standing. As  of 11/13/2018: Has not had leakage in a week, have not yet made it two weeks straight.    Time  10    Period  Weeks    Status  On-going    Target Date  12/17/18      PT LONG TERM GOAL #2   Title  Patient will score at or below 25/100 on the POPDI-6 and the CRADI-8 to demonstrate a clinically meaningful decrease in disability and distress due to pelvic floor dysfunction.    Baseline  CRADI-8: 50/100, POPDI-6: 50/100, As of 2/12: CRADI-8: 12/100, POPDI-6:  0/100  (PFDI:40/300, PFIQ: 57/300)    Time  10    Period  Weeks    Status  Achieved    Target Date  12/17/18      PT LONG TERM GOAL #3   Title  Patient will report urinating 6-8 times per day over the course of the prior week to demonstrate decreased frequency.    Baseline  Has to urinate immediately upon standing after sitting for any length of time.    Status  Achieved    Target Date  12/17/18      PT LONG TERM GOAL #4   Title  Patient will report no episodes of Fecal Incontinence over the past two weeks to demonstrate improved function and quality of life and to allow her to participate in water aerobics as part of her occupation.    Baseline  She has had to stop going to water aerobics due to incotinence.     Time  10    Period  Weeks    Status  Achieved    Target Date  12/17/18      PT LONG TERM GOAL #5   Title  Patient will score at or below 20/300 on the PFDI and 25/200 on the PFIQ to demonstrate a clinically meaningful decrease in disability and distress due to pelvic floor dysfunction.    Baseline  As of 11/13/2018: PFDI:40/300, PFIQ: 57/300    Time  5    Period  Weeks    Status  New    Target Date  12/17/18            Plan - 12/10/18 1007    Clinical Impression Statement  Due to patient's elevated blood pressure (183/54) and complaints of weakness and prolonged illness (> 2 weeks), patient and PT decided treatment today was not advisable. PT called patient's PCP to report vitals and coordinate an appointment today  at 11A with the PA to address patient's concerns and symptoms. Patient was educated to wear a surgical mask, perform frequent and thorough hand hygiene, and limit visits to crowded public spaces. Patient acknowledged understanding and returned explanation. Patient was also educated on the importance of quality rest and recovery in order to optimize the benefits from her physcial therapy sessions. Patient will continue to benefit from skilled therapuetic intervention to address deficits in PFM strength and coordination as well as overall mobility for improved QOL and decreased incidence of mixed incontinence.    Rehab Potential  Fair    Clinical Impairments Affecting Rehab Potential  Sleep Apnea, hemorrhoids, asthma, osteoporosis, partial hysterectomy from fibroids and cervical lesions, obesity, as well as an appendectomy    PT Frequency  2x / week    PT Duration  Other (comment)   10 weeks   PT Treatment/Interventions  ADLs/Self Care Home Management;Biofeedback;Electrical Stimulation;Moist Heat;Traction;Gait training;Functional mobility training;Therapeutic activities;Therapeutic exercise;Balance training;Neuromuscular re-education;Manual techniques;Patient/family education;Scar mobilization;Passive range of motion;Dry needling;Taping;Spinal Manipulations;Joint Manipulations    PT Next Visit Plan   Manual for kyphosis and TP release around R hip. Review hip ABD/EXT and step-up/step-downs as well as TA in mod-quad.    PT Home Exercise Plan  diaphragmatic breathing, posterior pelvic tilts with hips elevated and kegel, low back stretch, seated posterior pelvic tilt, urge-suppression, bow-and-arrow, hook-lying butterfly stretch.    Consulted and Agree with Plan of Care  Patient       Patient will benefit from skilled therapeutic intervention in order to improve the following deficits and impairments:  Abnormal gait, Increased fascial restricitons,  Impaired sensation, Improper body mechanics, Cardiopulmonary  status limiting activity, Decreased coordination, Decreased mobility, Decreased scar mobility, Increased muscle spasms, Impaired tone, Postural dysfunction, Decreased endurance, Decreased range of motion, Decreased strength, Decreased balance, Difficulty walking, Obesity, Impaired flexibility  Visit Diagnosis: Other lack of coordination  Other muscle spasm  Abnormal posture  Muscle weakness (generalized)     Problem List Patient Active Problem List   Diagnosis Date Noted  . Diabetes mellitus type 2 in obese ([place]) 09/04/2017  . Bilateral lower extremity edema 09/04/2017  . Gastroesophageal reflux disease without esophagitis 05/16/2017  . Diarrhea 12/29/2016  . Enteritis 12/29/2016  . Acute lower UTI 12/29/2016  . Incomplete emptying of bladder 11/01/2016  . Urge incontinence 07/14/2015  . Chronic renal failure, stage 3 (moderate) ([place]) 08/06/2014  . Renal mass, right 08/06/2014  . Precordial pain 07/24/2014  . Dyspnea 07/24/2014  . Palpitations 07/24/2014  . Essential hypertension 07/24/2014  . Hyperlipidemia 07/24/2014  . Bladder neoplasm of uncertain malignant potential 07/16/2014  . Gross hematuria 07/16/2014  . Asthma 05/17/2014  . Osteoporosis 05/17/2014  . Steatohepatitis 05/17/2014  . OSA on CPAP 05/17/2014   Myles Gip PT, DPT [phone] 12/10/2018, 10:17 AM  [place] MAIN [facility] SERVICES [address] [place], [place], [address] Phone: [phone]   Fax:  [phone]  Name: [name] MRN: [id number] Date of Birth: 1941/02/27

## 2018-12-12 DIAGNOSIS — E66812 Obesity, class 2: Secondary | ICD-10-CM | POA: Insufficient documentation

## 2018-12-16 ENCOUNTER — Ambulatory Visit: Payer: Medicare Other

## 2018-12-23 ENCOUNTER — Encounter: Payer: Self-pay | Admitting: Physical Therapy

## 2018-12-23 ENCOUNTER — Ambulatory Visit: Payer: Medicare Other

## 2018-12-23 NOTE — Therapy (Signed)
[place] MAIN [facility] SERVICES [address] [place], [place], [address] Phone: [phone]   Fax:  [phone]  Patient Details  Name: [name] MRN: [id number] Date of Birth: [birth year] Referring Provider:  No ref. provider found  Encounter Date: 12/23/2018  DPT called patient to let patient know the status of the clinic and ensure that we will be reaching out to schedule when we re-open. DPT fielded any questions patient had at this time and offered guidance on current home program as necessary. Patient reported feeling much improved since her last visit, and denied any remaining flu symptoms. Patient had no further questions at this time, and DPT advised that should questions arise patient can reach out to the clinic for guidance via phone or DPT email.  Myles Gip PT, DPT [phone] 12/23/2018, 1:42 PM  [place] MAIN [facility] SERVICES [address] [place], [place], [address] Phone: [phone]   Fax:  [phone]

## 2018-12-25 ENCOUNTER — Ambulatory Visit: Payer: Medicare Other

## 2019-02-04 ENCOUNTER — Other Ambulatory Visit: Payer: Self-pay | Admitting: Gastroenterology

## 2019-02-04 DIAGNOSIS — E041 Nontoxic single thyroid nodule: Secondary | ICD-10-CM

## 2019-02-04 DIAGNOSIS — M542 Cervicalgia: Secondary | ICD-10-CM

## 2019-02-26 IMAGING — CT CT ABD-PELV W/ CM
2 of 5 series · 15 of 46 positions shown, 17 images · IV contrast (APPLIED)
Comparison: August 12, 2010

CLINICAL DATA: Nausea and vomiting with chronic diarrhea

EXAM:
CT ABDOMEN AND PELVIS WITH CONTRAST
TECHNIQUE: Multidetector CT imaging of the abdomen and pelvis was performed
using the standard protocol following bolus administration of
intravenous contrast.
CONTRAST:  100mL T2INGW-BXX IOPAMIDOL (T2INGW-BXX) INJECTION 61%

[Series 2: routine abd/pel with · axial · 0.86mm/px · z∈[-493,-78]mm · 12 of 95 slices shown, 14 images]
[im 6/95  soft-tissue]
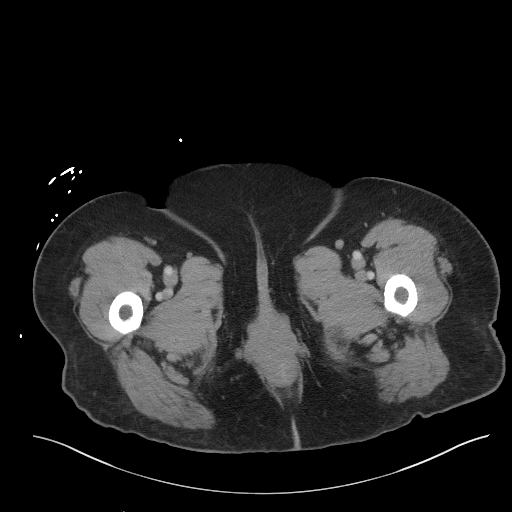
[im 6/95  bone]
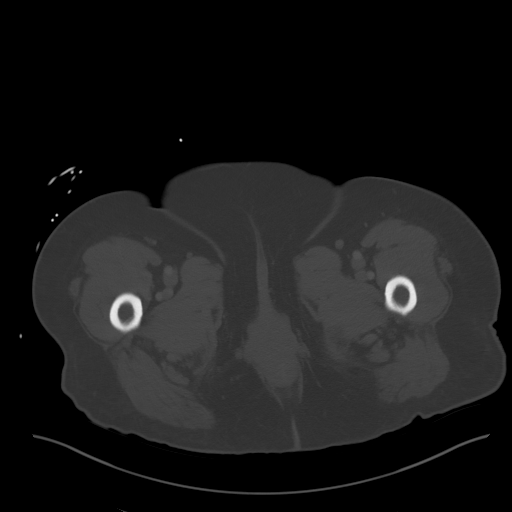
[im 16/95  soft-tissue]
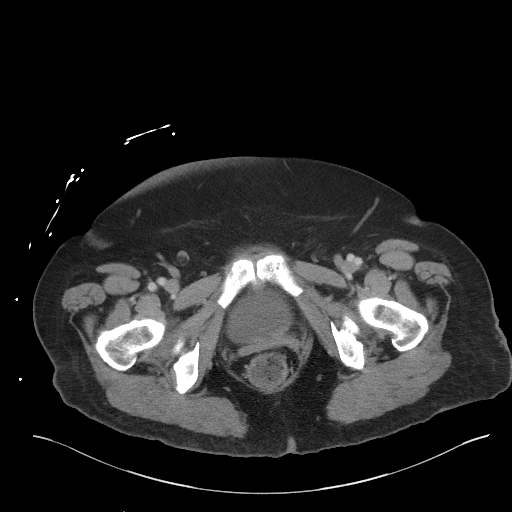
[im 21/95  soft-tissue]
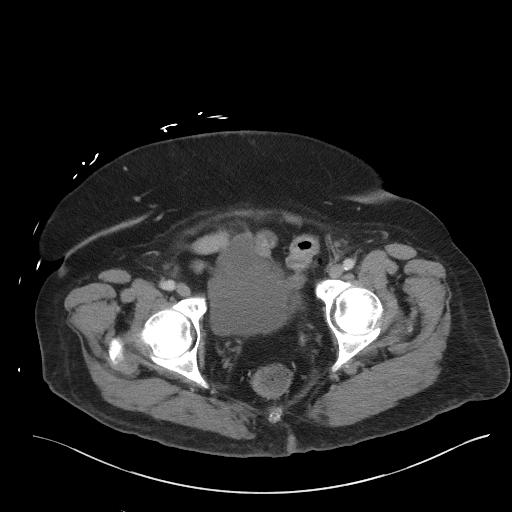
[im 27/95  soft-tissue]
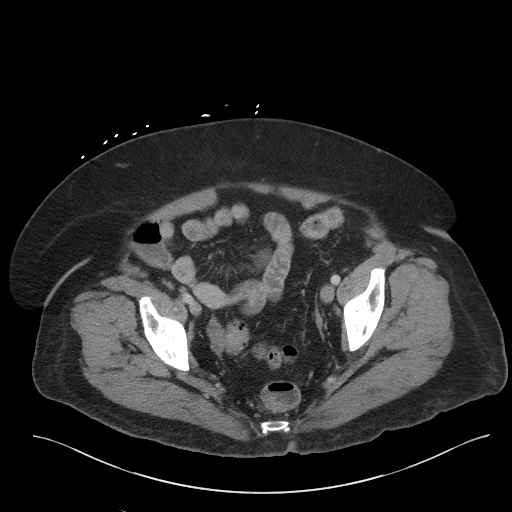
[im 37/95  soft-tissue]
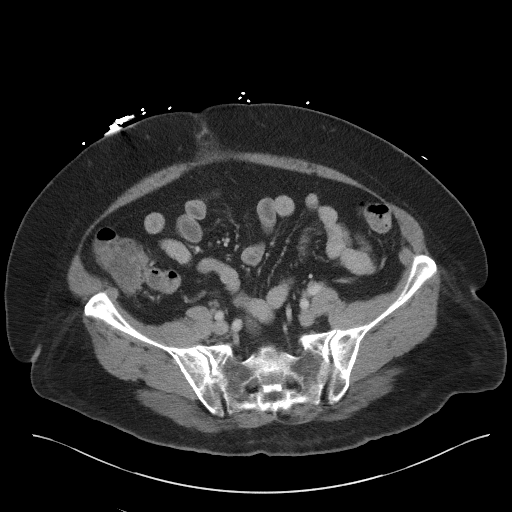
[im 42/95  soft-tissue]
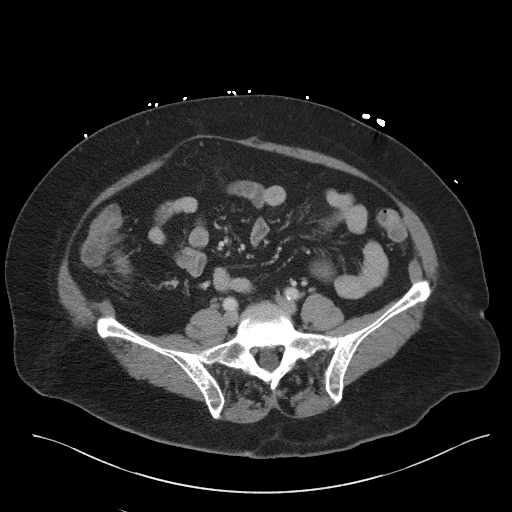
[im 53/95  soft-tissue]
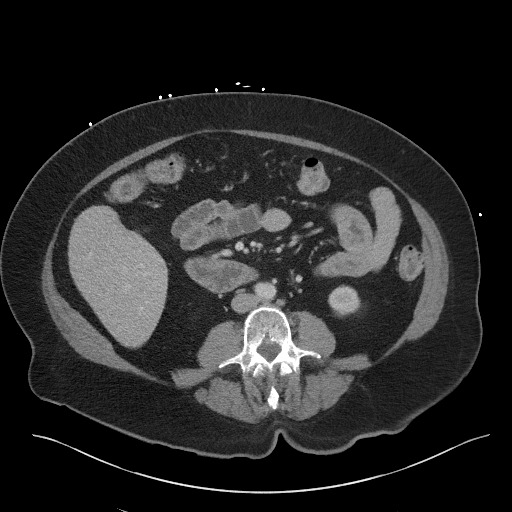
[im 58/95  soft-tissue]
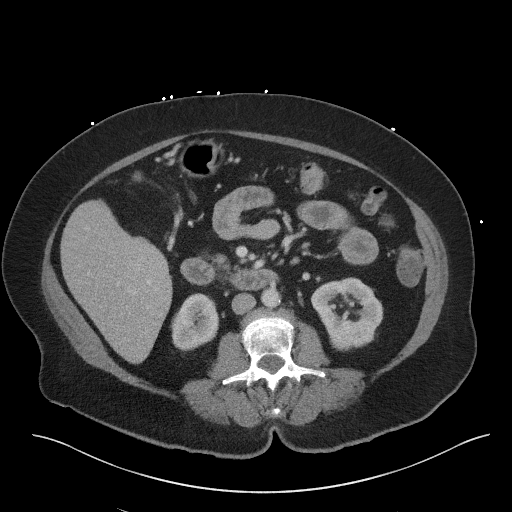
[im 68/95  soft-tissue]
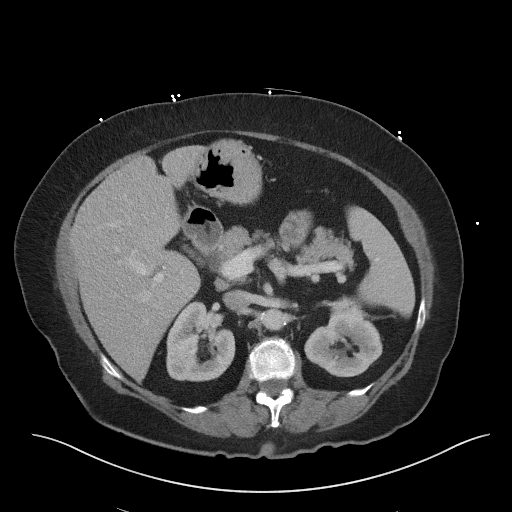
[im 68/95  bone]
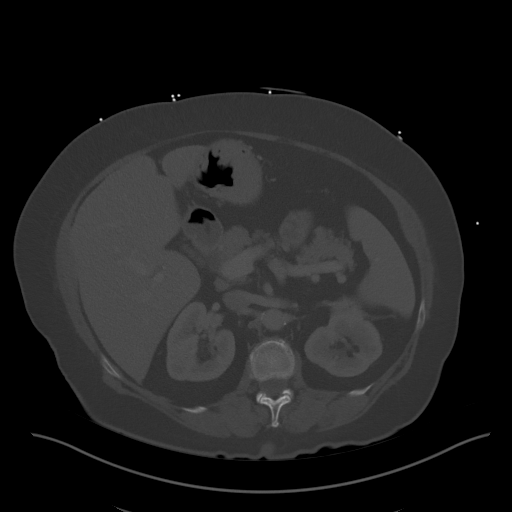
[im 74/95  soft-tissue]
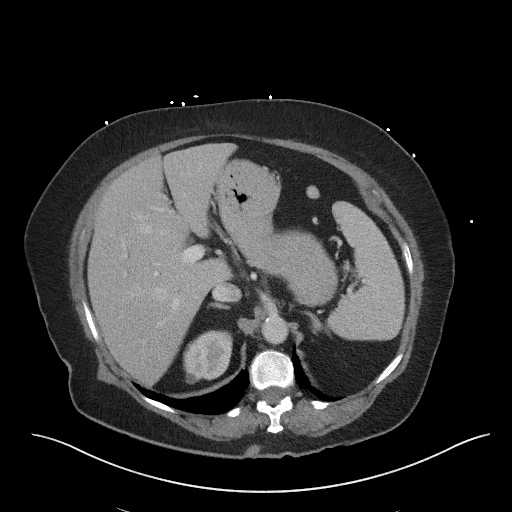
[im 79/95  soft-tissue]
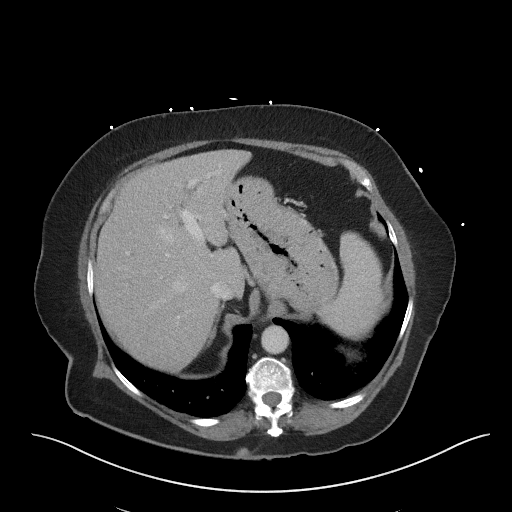
[im 89/95  soft-tissue]
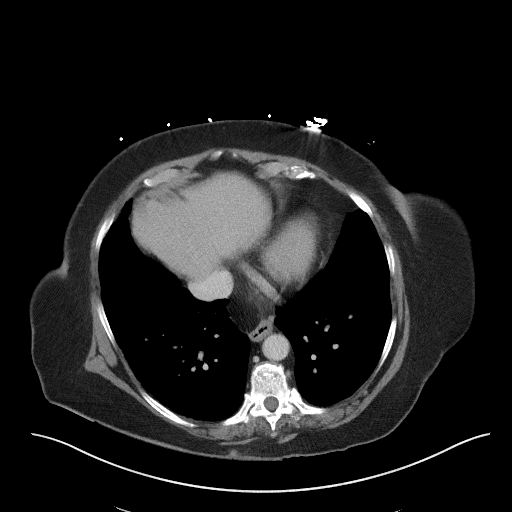

[Series 5: coronal st · coronal · 0.85mm/px · 3 of 96 slices shown]
[im 32/96  soft-tissue]
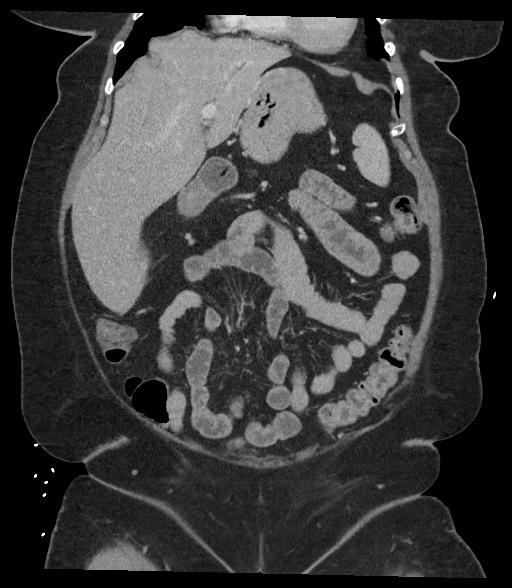
[im 43/96  soft-tissue]
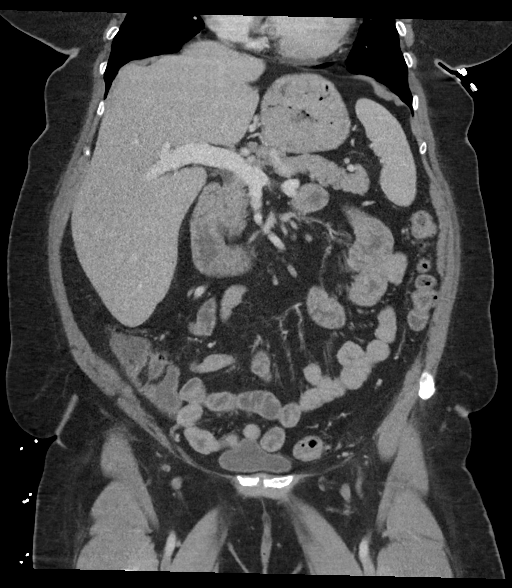
[im 53/96  soft-tissue]
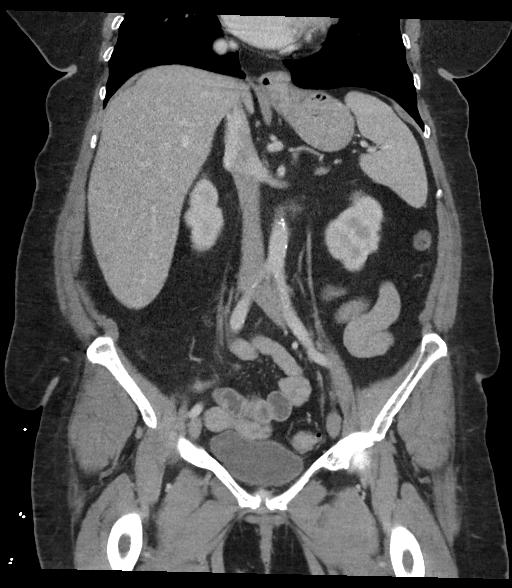

[15 of 46 positions shown; findings below may reference images not displayed]

FINDINGS: Lower chest: There is mild bibasilar lung scarring. There is a focal
hiatal hernia.

Hepatobiliary: Liver measures 24.0 cm in length. No focal liver
lesions are evident. Gallbladder is absent. There is mild
intrahepatic biliary duct dilatation. Common bile duct measures 11
mm, prominent. No biliary duct mass or calculus appreciable.

Pancreas: There is a stable 4 mm focus of fat in the body of the
pancreas, possibly a small lipoma, stable. Pancreas otherwise
appears unremarkable. No pancreatic inflammation or duct dilatation
evident.

Spleen: No splenic lesions are evident.

Adrenals/Urinary Tract: Adrenals appear normal bilaterally. There is
a cyst in the posterior mid right kidney measuring 1 x 1 cm with
immediately adjacent posterior right renal cyst measuring 2.6 x
cm. There is no hydronephrosis on either side. There is no renal or
ureteral calculus on either side. Urinary bladder is midline with
wall thickness within normal limits.

Stomach/Bowel: There are scattered sigmoid diverticula without
diverticulitis. Multiple loops of jejunum have thickened and
slightly hyperemic walls. There is no transition zone to suggest
bowel obstruction. There is no appreciable mesenteric thickening.
There is lipomatous infiltration of the ileocecal valve. There is no
appreciable free air or portal venous air.

Vascular/Lymphatic: There is atherosclerotic calcification in the
aorta and iliac arteries without aneurysm. Major mesenteric vessels
appear patent. No adenopathy is evident in the abdomen or pelvis.

Reproductive: Uterus is absent.  No pelvic mass.

Other: Appendix absent. No abscess or ascites evident in the abdomen
or pelvis. There is a rather minimal ventral hernia containing only
fat.

Musculoskeletal: There is degenerative change in the lumbar spine.
There are no blastic or lytic bone lesions. There is no
intramuscular or abdominal wall lesion.
IMPRESSION: Multiple loops of jejunum show mildly thickened walls with slight
hyperemia consistent with a degree of enteritis. No bowel
obstruction. No abscess. There are sigmoid diverticula without
diverticulitis.

Focal hiatal hernia.  Minimal ventral hernia containing only fat.

Prominent liver without focal liver lesion. There is biliary duct
dilatation, likely due to post cholecystectomy state.

4 mm stable lipoma in the body of the pancreas.

Aortoiliac atherosclerosis noted.

## 2019-04-10 ENCOUNTER — Ambulatory Visit
Admission: RE | Admit: 2019-04-10 | Discharge: 2019-04-10 | Disposition: A | Discharge status: Home / Self Care | Payer: Medicare Other | Source: Ambulatory Visit | Attending: Gastroenterology | Admitting: Gastroenterology

## 2019-04-10 ENCOUNTER — Other Ambulatory Visit: Payer: Self-pay

## 2019-04-10 DIAGNOSIS — E041 Nontoxic single thyroid nodule: Secondary | ICD-10-CM | POA: Insufficient documentation

## 2019-04-10 DIAGNOSIS — M542 Cervicalgia: Secondary | ICD-10-CM | POA: Insufficient documentation

## 2019-07-31 ENCOUNTER — Other Ambulatory Visit: Payer: Self-pay

## 2019-07-31 ENCOUNTER — Telehealth: Payer: Self-pay

## 2019-07-31 ENCOUNTER — Ambulatory Visit (INDEPENDENT_AMBULATORY_CARE_PROVIDER_SITE_OTHER): Payer: Medicare Other | Admitting: General Surgery

## 2019-07-31 ENCOUNTER — Encounter: Payer: Self-pay | Admitting: General Surgery

## 2019-07-31 VITALS — BP 153/67 | HR 63 | Temp 97.2°F | Resp 12 | Ht 62.0 in | Wt 208.6 lb

## 2019-07-31 DIAGNOSIS — E042 Nontoxic multinodular goiter: Secondary | ICD-10-CM | POA: Insufficient documentation

## 2019-07-31 NOTE — H&P (View-Only) (Signed)
Patient ID: [name], female   DOB: 08/28/1941, 78 y.o.   MRN: [id number]  Chief Complaint  Patient presents with  . New Patient (Initial Visit)    Goiter    HPI [name] is a 78 y.o. female.   She has been referred by Dr. Lavone Orn for surgical evaluation of a multinodular goiter.  [name] states that she believes that she has had thyroid nodules for many years, but more recently she has become more symptomatic.  She has difficulty swallowing and endorses frequent throat clearing.  She has pressure in her neck when she lies supine.  She does have obstructive sleep apnea and uses CPAP, but she thinks some of her symptoms may be attributable to the mass in her neck.  He denies heart palpitations or hand tremors.  She does not feel anxious or jittery.  She does endorse brittle nails and some hair thinning.  Her skin seems drier to her than usual.  She does seem to have some heat sensitivity with flushing and sweating at night.  She endorses diarrhea which is worse in the morning.  She reports fatigue.  He has had some recent weight gain but can attribute this to decreased activity.  There is no known family history of thyroid cancer although other malignancies have been diagnosed in several family members.  She denies any therapeutic or occupational radiation exposure to her head or neck.  She had an ultrasound that demonstrated multiple thyroid nodules, with the largest being in the isthmus and measuring 4.2 cm in greatest dimension.  Biopsies were performed by Dr. Gabriel Carina and these were benign.   Past Medical History:  Diagnosis Date  . Albuminuria   . Arthritis   . Asthma   . Cancer ([place])    skin  . Cataract cortical, senile   . Diabetes mellitus without complication ([place])   . GERD (gastroesophageal reflux disease)   . Hemorrhoids   . History of kidney stones   . Hyperlipidemia   . Hypertension   . Lyme disease   . 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 OSA (obstructive sleep apnea)   .  Osteoporosis   . Osteoporosis   . Reflux esophagitis   . Steatohepatitis   . Steatohepatitis     Past Surgical History:  Procedure Laterality Date  . ABDOMINAL HYSTERECTOMY    . APPENDECTOMY    . [place]  . CARDIAC CATHETERIZATION  08/13/2014   [facility]. no significant CAD, normal LVEDP.   Marland Kitchen CATARACT EXTRACTION    . CHOLECYSTECTOMY    . COLONOSCOPY    . COLONOSCOPY WITH PROPOFOL N/A 12/07/2016   Procedure: COLONOSCOPY WITH PROPOFOL;  Surgeon: Lollie Sails, MD;  Location: [facility] ENDOSCOPY;  Service: Endoscopy;  Laterality: N/A;  . ESOPHAGOGASTRODUODENOSCOPY    . ESOPHAGOGASTRODUODENOSCOPY (EGD) WITH PROPOFOL N/A 12/07/2016   Procedure: ESOPHAGOGASTRODUODENOSCOPY (EGD) WITH PROPOFOL;  Surgeon: Lollie Sails, MD;  Location: [facility] ENDOSCOPY;  Service: Endoscopy;  Laterality: N/A;  . ESOPHAGOGASTRODUODENOSCOPY (EGD) WITH PROPOFOL N/A 01/07/2018   Procedure: ESOPHAGOGASTRODUODENOSCOPY (EGD) WITH PROPOFOL;  Surgeon: Lollie Sails, MD;  Location: [facility] ENDOSCOPY;  Service: Endoscopy;  Laterality: N/A;  . EYE SURGERY    . HEMORRHOID SURGERY    . PARTIAL HYSTERECTOMY    . TONSILLECTOMY      Family History  Problem Relation Age of Onset  . Breast cancer Mother   . Heart attack Father     Social History Social History  Tobacco Use  . Smoking status: Never Smoker  . Smokeless tobacco: Never Used  Substance Use Topics  . Alcohol use: No  . Drug use: No    Allergies  Allergen Reactions  . Ace Inhibitors     Other reaction(s): Unknown  . Eggs Or Egg-Derived Products Diarrhea  . Other     Other reaction(s): Other (See Comments) Eggs  . Prednisone     Other reaction(s): Other (See Comments) joint pain  . Risedronate     Other reaction(s): Other (See Comments)  . Sulfa Antibiotics     Other reaction(s): Other (See Comments)  . Sulfasalazine Other (See Comments)    Current Outpatient Medications  Medication Sig Dispense Refill  . albuterol  (PROVENTIL HFA;VENTOLIN HFA) 108 (90 BASE) MCG/ACT inhaler Inhale 1-2 puffs into the lungs every 4 (four) hours as needed for wheezing or shortness of breath.    Marland Kitchen amLODipine (NORVASC) 10 MG tablet Take 10 mg by mouth daily.    . calcium carbonate (OS-CAL - DOSED IN MG OF ELEMENTAL CALCIUM) 1250 (500 Ca) MG tablet Take 1 tablet by mouth.    . esomeprazole (NEXIUM) 40 MG packet Take 40 mg by mouth daily before breakfast.    . fluticasone (FLONASE) 50 MCG/ACT nasal spray USE 2 SPRAYS IN EACH NOSTRIL DAILY    . Fluticasone-Salmeterol (ADVAIR) 250-50 MCG/DOSE AEPB Inhale 1 puff into the lungs 2 (two) times daily.    Marland Kitchen losartan (COZAAR) 100 MG tablet Take 100 mg by mouth daily.    . metFORMIN (GLUCOPHAGE) 500 MG tablet Take 500 mg by mouth 2 (two) times daily with a meal.     . metoprolol tartrate (LOPRESSOR) 50 MG tablet     . montelukast (SINGULAIR) 10 MG tablet Take 10 mg by mouth at bedtime.    . potassium chloride SA (K-DUR,KLOR-CON) 20 MEQ tablet Take 20 mEq by mouth 2 (two) times daily.    . rosuvastatin (CRESTOR) 40 MG tablet Take 40 mg by mouth daily.    . sertraline (ZOLOFT) 100 MG tablet Take 100 mg by mouth daily.    Marland Kitchen dicyclomine (BENTYL) 10 MG capsule Take 10 mg by mouth 4 (four) times daily -  before meals and at bedtime.    . torsemide (DEMADEX) 10 MG tablet Take by mouth.     No current facility-administered medications for this visit.     Review of Systems Review of Systems  HENT: Positive for trouble swallowing and voice change.   Respiratory: Positive for shortness of breath.   Cardiovascular: Positive for leg swelling.  Endocrine: Positive for heat intolerance.  All other systems reviewed and are negative.   Blood pressure (!) 153/67, pulse 63, temperature (!) 97.2 F (36.2 C), temperature source Temporal, resp. rate 12, height 5' 2"  (1.575 m), weight 208 lb 9.6 oz (94.6 kg), SpO2 94 %. Body mass index is 38.15 kg/m.  Physical Exam Physical Exam Constitutional:       General: She is not in acute distress.    Appearance: Normal appearance. She is obese.  HENT:     Head: Normocephalic and atraumatic.     Nose:     Comments: Covered with a mask secondary to COVID-19 precautions    Mouth/Throat:     Comments: Covered with a mask secondary to COVID-19 precautions Eyes:     General: No scleral icterus.       Right eye: No discharge.        Left eye: No discharge.  Comments: No proptosis or exophthalmos  Neck:     Musculoskeletal: Normal range of motion.     Comments: The thyroid is enlarged bilaterally.  There is a dominant nodule palpable within the isthmus.  The gland moves freely with deglutition.  There is no palpable cervical or supraclavicular lymphadenopathy. Cardiovascular:     Rate and Rhythm: Normal rate and regular rhythm.     Pulses: Normal pulses.  Pulmonary:     Effort: Pulmonary effort is normal.     Breath sounds: Normal breath sounds.  Abdominal:     General: Bowel sounds are normal.     Palpations: Abdomen is soft.     Comments: Protuberant, consistent with her level of obesity.  Musculoskeletal:     Right lower leg: Edema present.     Left lower leg: Edema present.     Comments: 2+ pitting edema to the knee  Skin:    General: Skin is warm and dry.  Neurological:     General: No focal deficit present.     Mental Status: She is alert and oriented to person, place, and time.  Psychiatric:        Mood and Affect: Mood normal.        Behavior: Behavior normal.     Data Reviewed I reviewed the outside ultrasound report from a study performed on 15 October.; the actual images were not available to me.  The report is copied here:  Indication: Multinodular goiter  Comparison: None  Technique: Gray-scale and color Doppler images of the neck were obtained.  Findings: THYROID: The right lobe of the thyroid measures 5.19 x 3.08 x 2.27 cm. The left lobe of the thyroid measures 7.62 x 2.99 x 2.15 cm. The isthmus measures  2.35 cm in AP depth.  Echotexture of the thyroid is homogeneous.  Within the right lobe, there are four nodules: 1.66 x 1.27 x 1.75 cm  Upper pole, hypoechoic 0.91 x 0.74 x 1.08 cm  Mid pole lateral, hypoechoic 1.44 x 1.29 x 1.27 cm  Mid-lower pole, isoechoic 1.42 x 1.68 x 1.44 cm  Lower pole, isoechoic  Within the isthmus is a dominant 4.25 x 2.42 x 3.81 cm mixed solid and  cystic nodule.  Within the left lobe, there are two nodules: 1.15 x 0.71 x 0.66 cm Upper pole, hypoechoic 3.63 x 3.25 x 2.44 cm Lower pole, hypoechoic  There are no significantly enlarged lymph nodes in the neck.  Impression: Homogeneous thyroid gland which is enlarged. There are multiple nodules  throughout the thyroid gland. This is consistent with a multinodular  goiter.  There is a dominant isthmus 4.2 cm mixed solid and cystic nodule. In the  left lower pole is a 3.6 cm hypoechoic nodule. Additional smaller  right-sided and left-sided hypoechoic nodules are also present. There are  no microcalcifications within any nodules I also reviewed Dr. Ledora Bottcher clinic notes from 23 July and 19 October.  The actual biopsy report from [facility] is not available, but Dr. Joycie Peek notes does relate that the findings were benign.  Her notes also discuss a referral to surgery for total thyroidectomy to alleviate the patient's compressive symptoms.  A TSH was performed on July 23 and was within normal limits at 2.391.  Assessment This is a 78 year old woman with a multinodular goiter.  The dominant nodules have been biopsied and benign.  She does have compressive symptoms.  I recommended that she undergo surgical resection.  Plan The risks of thyroid surgery were discussed, including (  but not limited to): bleeding, infection, damage to surrounding structures/tissues, injury (temporary or permanent) to the recurrent laryngeal nerve, hypoparathyroidism (temporary or permanent), need for thyroid hormone replacement  therapy, need for additional surgery and/or treatment, recurrence of disease, tracheostomy (temporary or permanent).  The patient had the opportunity to ask any questions and these were answered to their satisfaction.  We will schedule her at a mutually convenient date.  Due to her lung issues and obstructive sleep apnea, we will ask her primary care provider to give [place] medical clearance to proceed.    Fredirick Maudlin 07/31/2019, 3:07 PM

## 2019-07-31 NOTE — Telephone Encounter (Signed)
Pulmonary clearance faxed to Dr.Mark Ouida Sills at this time.

## 2019-07-31 NOTE — Patient Instructions (Addendum)
Please call with any questions or concerns.   I will send for Pulmonary clearance to Dr.Mark Ouida Sills at [facility].

## 2019-07-31 NOTE — Progress Notes (Signed)
Patient ID: [name], female   DOB: Jan 19, 1941, 78 y.o.   MRN: [id number]  Chief Complaint  Patient presents with  . New Patient (Initial Visit)    Goiter    HPI [name] is a 78 y.o. female.   She has been referred by Dr. Lavone Orn for surgical evaluation of a multinodular goiter.  [name] states that she believes that she has had thyroid nodules for many years, but more recently she has become more symptomatic.  She has difficulty swallowing and endorses frequent throat clearing.  She has pressure in her neck when she lies supine.  She does have obstructive sleep apnea and uses CPAP, but she thinks some of her symptoms may be attributable to the mass in her neck.  He denies heart palpitations or hand tremors.  She does not feel anxious or jittery.  She does endorse brittle nails and some hair thinning.  Her skin seems drier to her than usual.  She does seem to have some heat sensitivity with flushing and sweating at night.  She endorses diarrhea which is worse in the morning.  She reports fatigue.  He has had some recent weight gain but can attribute this to decreased activity.  There is no known family history of thyroid cancer although other malignancies have been diagnosed in several family members.  She denies any therapeutic or occupational radiation exposure to her head or neck.  She had an ultrasound that demonstrated multiple thyroid nodules, with the largest being in the isthmus and measuring 4.2 cm in greatest dimension.  Biopsies were performed by Dr. Gabriel [name] and these were benign.   Past Medical History:  Diagnosis Date  . Albuminuria   . Arthritis   . Asthma   . Cancer ([place])    skin  . Cataract cortical, senile   . Diabetes mellitus without complication ([place])   . GERD (gastroesophageal reflux disease)   . Hemorrhoids   . History of kidney stones   . Hyperlipidemia   . Hypertension   . Lyme disease   . 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 OSA (obstructive sleep apnea)   .  Osteoporosis   . Osteoporosis   . Reflux esophagitis   . Steatohepatitis   . Steatohepatitis     Past Surgical History:  Procedure Laterality Date  . ABDOMINAL HYSTERECTOMY    . APPENDECTOMY    . [place]  . CARDIAC CATHETERIZATION  08/13/2014   [facility]. no significant CAD, normal LVEDP.   Marland Kitchen CATARACT EXTRACTION    . CHOLECYSTECTOMY    . COLONOSCOPY    . COLONOSCOPY WITH PROPOFOL N/A 12/07/2016   Procedure: COLONOSCOPY WITH PROPOFOL;  Surgeon: Lollie Sails, MD;  Location: [facility] ENDOSCOPY;  Service: Endoscopy;  Laterality: N/A;  . ESOPHAGOGASTRODUODENOSCOPY    . ESOPHAGOGASTRODUODENOSCOPY (EGD) WITH PROPOFOL N/A 12/07/2016   Procedure: ESOPHAGOGASTRODUODENOSCOPY (EGD) WITH PROPOFOL;  Surgeon: Lollie Sails, MD;  Location: [facility] ENDOSCOPY;  Service: Endoscopy;  Laterality: N/A;  . ESOPHAGOGASTRODUODENOSCOPY (EGD) WITH PROPOFOL N/A 01/07/2018   Procedure: ESOPHAGOGASTRODUODENOSCOPY (EGD) WITH PROPOFOL;  Surgeon: Lollie Sails, MD;  Location: [facility] ENDOSCOPY;  Service: Endoscopy;  Laterality: N/A;  . EYE SURGERY    . HEMORRHOID SURGERY    . PARTIAL HYSTERECTOMY    . TONSILLECTOMY      Family History  Problem Relation Age of Onset  . Breast cancer Mother   . Heart attack Father     Social History Social History  Tobacco Use  . Smoking status: Never Smoker  . Smokeless tobacco: Never Used  Substance Use Topics  . Alcohol use: No  . Drug use: No    Allergies  Allergen Reactions  . Ace Inhibitors     Other reaction(s): Unknown  . Eggs Or Egg-Derived Products Diarrhea  . Other     Other reaction(s): Other (See Comments) Eggs  . Prednisone     Other reaction(s): Other (See Comments) joint pain  . Risedronate     Other reaction(s): Other (See Comments)  . Sulfa Antibiotics     Other reaction(s): Other (See Comments)  . Sulfasalazine Other (See Comments)    Current Outpatient Medications  Medication Sig Dispense Refill  . albuterol  (PROVENTIL HFA;VENTOLIN HFA) 108 (90 BASE) MCG/ACT inhaler Inhale 1-2 puffs into the lungs every 4 (four) hours as needed for wheezing or shortness of breath.    Marland Kitchen amLODipine (NORVASC) 10 MG tablet Take 10 mg by mouth daily.    . calcium carbonate (OS-CAL - DOSED IN MG OF ELEMENTAL CALCIUM) 1250 (500 Ca) MG tablet Take 1 tablet by mouth.    . esomeprazole (NEXIUM) 40 MG packet Take 40 mg by mouth daily before breakfast.    . fluticasone (FLONASE) 50 MCG/ACT nasal spray USE 2 SPRAYS IN EACH NOSTRIL DAILY    . Fluticasone-Salmeterol (ADVAIR) 250-50 MCG/DOSE AEPB Inhale 1 puff into the lungs 2 (two) times daily.    Marland Kitchen losartan (COZAAR) 100 MG tablet Take 100 mg by mouth daily.    . metFORMIN (GLUCOPHAGE) 500 MG tablet Take 500 mg by mouth 2 (two) times daily with a meal.     . metoprolol tartrate (LOPRESSOR) 50 MG tablet     . montelukast (SINGULAIR) 10 MG tablet Take 10 mg by mouth at bedtime.    . potassium chloride SA (K-DUR,KLOR-CON) 20 MEQ tablet Take 20 mEq by mouth 2 (two) times daily.    . rosuvastatin (CRESTOR) 40 MG tablet Take 40 mg by mouth daily.    . sertraline (ZOLOFT) 100 MG tablet Take 100 mg by mouth daily.    Marland Kitchen dicyclomine (BENTYL) 10 MG capsule Take 10 mg by mouth 4 (four) times daily -  before meals and at bedtime.    . torsemide (DEMADEX) 10 MG tablet Take by mouth.     No current facility-administered medications for this visit.     Review of Systems Review of Systems  HENT: Positive for trouble swallowing and voice change.   Respiratory: Positive for shortness of breath.   Cardiovascular: Positive for leg swelling.  Endocrine: Positive for heat intolerance.  All other systems reviewed and are negative.   Blood pressure (!) 153/67, pulse 63, temperature (!) 97.2 F (36.2 C), temperature source Temporal, resp. rate 12, height 5' 2"  (1.575 m), weight 208 lb 9.6 oz (94.6 kg), SpO2 94 %. Body mass index is 38.15 kg/m.  Physical Exam Physical Exam Constitutional:       General: She is not in acute distress.    Appearance: Normal appearance. She is obese.  HENT:     Head: Normocephalic and atraumatic.     Nose:     Comments: Covered with a mask secondary to COVID-19 precautions    Mouth/Throat:     Comments: Covered with a mask secondary to COVID-19 precautions Eyes:     General: No scleral icterus.       Right eye: No discharge.        Left eye: No discharge.  Comments: No proptosis or exophthalmos  Neck:     Musculoskeletal: Normal range of motion.     Comments: The thyroid is enlarged bilaterally.  There is a dominant nodule palpable within the isthmus.  The gland moves freely with deglutition.  There is no palpable cervical or supraclavicular lymphadenopathy. Cardiovascular:     Rate and Rhythm: Normal rate and regular rhythm.     Pulses: Normal pulses.  Pulmonary:     Effort: Pulmonary effort is normal.     Breath sounds: Normal breath sounds.  Abdominal:     General: Bowel sounds are normal.     Palpations: Abdomen is soft.     Comments: Protuberant, consistent with her level of obesity.  Musculoskeletal:     Right lower leg: Edema present.     Left lower leg: Edema present.     Comments: 2+ pitting edema to the knee  Skin:    General: Skin is warm and dry.  Neurological:     General: No focal deficit present.     Mental Status: She is alert and oriented to person, place, and time.  Psychiatric:        Mood and Affect: Mood normal.        Behavior: Behavior normal.     Data Reviewed I reviewed the outside ultrasound report from a study performed on 15 October.; the actual images were not available to me.  The report is copied here:  Indication: Multinodular goiter  Comparison: None  Technique: Gray-scale and color Doppler images of the neck were obtained.  Findings: THYROID: The right lobe of the thyroid measures 5.19 x 3.08 x 2.27 cm. The left lobe of the thyroid measures 7.62 x 2.99 x 2.15 cm. The isthmus measures  2.35 cm in AP depth.  Echotexture of the thyroid is homogeneous.  Within the right lobe, there are four nodules: 1.66 x 1.27 x 1.75 cm  Upper pole, hypoechoic 0.91 x 0.74 x 1.08 cm  Mid pole lateral, hypoechoic 1.44 x 1.29 x 1.27 cm  Mid-lower pole, isoechoic 1.42 x 1.68 x 1.44 cm  Lower pole, isoechoic  Within the isthmus is a dominant 4.25 x 2.42 x 3.81 cm mixed solid and  cystic nodule.  Within the left lobe, there are two nodules: 1.15 x 0.71 x 0.66 cm Upper pole, hypoechoic 3.63 x 3.25 x 2.44 cm Lower pole, hypoechoic  There are no significantly enlarged lymph nodes in the neck.  Impression: Homogeneous thyroid gland which is enlarged. There are multiple nodules  throughout the thyroid gland. This is consistent with a multinodular  goiter.  There is a dominant isthmus 4.2 cm mixed solid and cystic nodule. In the  left lower pole is a 3.6 cm hypoechoic nodule. Additional smaller  right-sided and left-sided hypoechoic nodules are also present. There are  no microcalcifications within any nodules I also reviewed Dr. Ledora Bottcher clinic notes from 23 July and 19 October.  The actual biopsy report from [facility] is not available, but Dr. Joycie Peek notes does relate that the findings were benign.  Her notes also discuss a referral to surgery for total thyroidectomy to alleviate the patient's compressive symptoms.  A TSH was performed on July 23 and was within normal limits at 2.391.  Assessment This is a 78 year old woman with a multinodular goiter.  The dominant nodules have been biopsied and benign.  She does have compressive symptoms.  I recommended that she undergo surgical resection.  Plan The risks of thyroid surgery were discussed, including (  but not limited to): bleeding, infection, damage to surrounding structures/tissues, injury (temporary or permanent) to the recurrent laryngeal nerve, hypoparathyroidism (temporary or permanent), need for thyroid hormone replacement  therapy, need for additional surgery and/or treatment, recurrence of disease, tracheostomy (temporary or permanent).  The patient had the opportunity to ask any questions and these were answered to their satisfaction.  We will schedule her at a mutually convenient date.  Due to her lung issues and obstructive sleep apnea, we will ask her primary care provider to give [place] medical clearance to proceed.    Fredirick Maudlin 07/31/2019, 3:07 PM

## 2019-08-06 ENCOUNTER — Telehealth: Payer: Self-pay

## 2019-08-06 NOTE — Telephone Encounter (Signed)
Pt has been advised of pre admission date/time, Covid Testing date and Surgery date.  Surgery Date: 08/25/19 Preadmission Testing Date: 08/19/19 at 10:30 am Covid Testing Date: 08/21/19 - patient advised to go to the [place] ([place]) between 8-10:30 am  Emmi Video sent via [facility] and [employer].  Patient has been made aware to call [phone], between 1-3:00pm on 08/22/19 the Friday  before surgery, to find out what time to arrive.

## 2019-08-19 ENCOUNTER — Other Ambulatory Visit: Payer: Self-pay

## 2019-08-19 ENCOUNTER — Telehealth: Payer: Self-pay

## 2019-08-19 ENCOUNTER — Other Ambulatory Visit: Payer: Self-pay | Admitting: General Surgery

## 2019-08-19 ENCOUNTER — Encounter
Admission: RE | Admit: 2019-08-19 | Discharge: 2019-08-19 | Disposition: A | Discharge status: Home / Self Care | Payer: Medicare Other | Source: Ambulatory Visit | Attending: General Surgery | Admitting: General Surgery

## 2019-08-19 DIAGNOSIS — R001 Bradycardia, unspecified: Secondary | ICD-10-CM | POA: Diagnosis not present

## 2019-08-19 DIAGNOSIS — G4733 Obstructive sleep apnea (adult) (pediatric): Secondary | ICD-10-CM | POA: Diagnosis not present

## 2019-08-19 DIAGNOSIS — I10 Essential (primary) hypertension: Secondary | ICD-10-CM | POA: Insufficient documentation

## 2019-08-19 DIAGNOSIS — E042 Nontoxic multinodular goiter: Secondary | ICD-10-CM

## 2019-08-19 DIAGNOSIS — Z01818 Encounter for other preprocedural examination: Secondary | ICD-10-CM | POA: Insufficient documentation

## 2019-08-19 DIAGNOSIS — E119 Type 2 diabetes mellitus without complications: Secondary | ICD-10-CM | POA: Diagnosis not present

## 2019-08-19 LAB — CBC
HCT: 36.2 % (ref 36.0–46.0)
Hemoglobin: 12.2 g/dL (ref 12.0–15.0)
MCH: 27.5 pg (ref 26.0–34.0)
MCHC: 33.7 g/dL (ref 30.0–36.0)
MCV: 81.5 fL (ref 80.0–100.0)
Platelets: 145 10*3/uL — ABNORMAL LOW (ref 150–400)
RBC: 4.44 MIL/uL (ref 3.87–5.11)
RDW: 13.6 % (ref 11.5–15.5)
WBC: 7.8 10*3/uL (ref 4.0–10.5)
nRBC: 0 % (ref 0.0–0.2)

## 2019-08-19 NOTE — Patient Instructions (Signed)
Your procedure is scheduled on: Monday 08/25/19 Report to [place]. To find out your arrival time please call [phone] between 1PM - 3PM on Friday 08/22/19.  Remember: Instructions that are not followed completely may result in serious medical risk, up to and including death, or upon the discretion of your surgeon and anesthesiologist your surgery may need to be rescheduled.     _X__ 1. Do not eat food after midnight the night before your procedure.                 No gum chewing or hard candies. You may drink clear liquids up to 2 hours                 before you are scheduled to arrive for your surgery- DO not drink clear                 liquids within 2 hours of the start of your surgery.                 Clear Liquids include:  water, apple juice without pulp, clear carbohydrate                 drink such as Clearfast or Gatorade, Black Coffee or Tea (Do not add                 anything to coffee or tea). Diabetics water only  __X__2.  On the morning of surgery brush your teeth with toothpaste and water, you                 may rinse your mouth with mouthwash if you wish.  Do not swallow any              toothpaste of mouthwash.     _X__ 3.  No Alcohol for 24 hours before or after surgery.   _X__ 4.  Do Not Smoke or use e-cigarettes For 24 Hours Prior to Your Surgery.                 Do not use any chewable tobacco products for at least 6 hours prior to                 surgery.  ____  5.  Bring all medications with you on the day of surgery if instructed.   __X__  6.  Notify your doctor if there is any change in your medical condition      (cold, fever, infections).     Do not wear jewelry, make-up, hairpins, clips or nail polish. Do not wear lotions, powders, or perfumes.  Do not shave 48 hours prior to surgery. Men may shave face and neck. Do not bring valuables to the hospital.    [facility] is not responsible for  any belongings or valuables.  Contacts, dentures/partials or body piercings may not be worn into surgery. Bring a case for your contacts, glasses or hearing aids, a denture cup will be supplied. Leave your suitcase in the car. After surgery it may be brought to your room. For patients admitted to the hospital, discharge time is determined by your treatment team.   Patients discharged the day of surgery will not be allowed to drive home.   Please read over the following fact sheets that you were given:   MRSA Information  __X__ Take these medicines the morning of surgery with A SIP OF WATER:  1. amLODipine (NORVASC  2. esomeprazole (NEXIUM  3. hydrALAZINE (APRESOLINE  4. metoprolol tartrate (LOPRESSOR  5. rosuvastatin (CRESTOR  6. sertraline (ZOLOFT  ____ Fleet Enema (as directed)   __X__ Use CHG Soap/SAGE wipes as directed  __X__ Use inhalers on the day of surgery  __X__ Stop metformin/Janumet/Farxiga 2 days prior to surgery    ____ Take 1/2 of usual insulin dose the night before surgery. No insulin the morning          of surgery.   ____ Stop Blood Thinners Coumadin/Plavix/Xarelto/Pleta/Pradaxa/Eliquis/Effient/Aspirin  on   Or contact your Surgeon, Cardiologist or Medical Doctor regarding  ability to stop your blood thinners  __X__ Stop Anti-inflammatories 7 days before surgery such as Advil, Ibuprofen, Motrin,  BC or Goodies Powder, Naprosyn, Naproxen, Aleve, Aspirin    __X__ Stop all herbal supplements, fish oil or vitamin E until after surgery.    __X__ Bring C-Pap to the hospital.

## 2019-08-19 NOTE — Telephone Encounter (Signed)
Pulmonary clearance received from [name]- patient is moderate risk for low risk protocol. See Dr.Anderson note from 08/19/2019 Care everywhere.

## 2019-08-21 ENCOUNTER — Other Ambulatory Visit
Admission: RE | Admit: 2019-08-21 | Discharge: 2019-08-21 | Disposition: A | Discharge status: Home / Self Care | Payer: Medicare Other | Source: Ambulatory Visit | Attending: General Surgery | Admitting: General Surgery

## 2019-08-21 DIAGNOSIS — Z20828 Contact with and (suspected) exposure to other viral communicable diseases: Secondary | ICD-10-CM | POA: Diagnosis not present

## 2019-08-21 DIAGNOSIS — Z01812 Encounter for preprocedural laboratory examination: Secondary | ICD-10-CM | POA: Insufficient documentation

## 2019-08-21 LAB — SARS CORONAVIRUS 2 (TAT 6-24 HRS): SARS Coronavirus 2: NEGATIVE

## 2019-08-24 ENCOUNTER — Encounter: Payer: Self-pay | Admitting: Anesthesiology

## 2019-08-25 ENCOUNTER — Observation Stay
Admission: RE | Admit: 2019-08-25 | Discharge: 2019-08-26 | Disposition: A | Discharge status: Home / Self Care | Payer: Medicare Other | Source: Ambulatory Visit | Attending: General Surgery | Admitting: General Surgery

## 2019-08-25 ENCOUNTER — Ambulatory Visit: Payer: Medicare Other | Admitting: Anesthesiology

## 2019-08-25 ENCOUNTER — Encounter
Admission: RE | Disposition: A | Discharge status: Home / Self Care | Payer: Self-pay | Source: Ambulatory Visit | Attending: General Surgery

## 2019-08-25 ENCOUNTER — Other Ambulatory Visit: Payer: Self-pay

## 2019-08-25 DIAGNOSIS — E890 Postprocedural hypothyroidism: Secondary | ICD-10-CM

## 2019-08-25 DIAGNOSIS — E119 Type 2 diabetes mellitus without complications: Secondary | ICD-10-CM | POA: Insufficient documentation

## 2019-08-25 DIAGNOSIS — J45909 Unspecified asthma, uncomplicated: Secondary | ICD-10-CM | POA: Diagnosis not present

## 2019-08-25 DIAGNOSIS — Z85828 Personal history of other malignant neoplasm of skin: Secondary | ICD-10-CM | POA: Diagnosis not present

## 2019-08-25 DIAGNOSIS — Z7984 Long term (current) use of oral hypoglycemic drugs: Secondary | ICD-10-CM | POA: Diagnosis not present

## 2019-08-25 DIAGNOSIS — E049 Nontoxic goiter, unspecified: Secondary | ICD-10-CM | POA: Diagnosis present

## 2019-08-25 DIAGNOSIS — E785 Hyperlipidemia, unspecified: Secondary | ICD-10-CM | POA: Insufficient documentation

## 2019-08-25 DIAGNOSIS — Z6838 Body mass index (BMI) 38.0-38.9, adult: Secondary | ICD-10-CM | POA: Insufficient documentation

## 2019-08-25 DIAGNOSIS — E063 Autoimmune thyroiditis: Secondary | ICD-10-CM | POA: Insufficient documentation

## 2019-08-25 DIAGNOSIS — C73 Malignant neoplasm of thyroid gland: Principal | ICD-10-CM | POA: Insufficient documentation

## 2019-08-25 DIAGNOSIS — K219 Gastro-esophageal reflux disease without esophagitis: Secondary | ICD-10-CM | POA: Insufficient documentation

## 2019-08-25 DIAGNOSIS — Z23 Encounter for immunization: Secondary | ICD-10-CM | POA: Insufficient documentation

## 2019-08-25 DIAGNOSIS — Z7951 Long term (current) use of inhaled steroids: Secondary | ICD-10-CM | POA: Diagnosis not present

## 2019-08-25 DIAGNOSIS — G4733 Obstructive sleep apnea (adult) (pediatric): Secondary | ICD-10-CM | POA: Insufficient documentation

## 2019-08-25 DIAGNOSIS — E042 Nontoxic multinodular goiter: Secondary | ICD-10-CM

## 2019-08-25 DIAGNOSIS — I10 Essential (primary) hypertension: Secondary | ICD-10-CM | POA: Diagnosis not present

## 2019-08-25 DIAGNOSIS — Z79899 Other long term (current) drug therapy: Secondary | ICD-10-CM | POA: Diagnosis not present

## 2019-08-25 HISTORY — PX: THYROIDECTOMY: SHX17

## 2019-08-25 LAB — HEMOGLOBIN A1C
Hgb A1c MFr Bld: 6.4 % — ABNORMAL HIGH (ref 4.8–5.6)
Mean Plasma Glucose: 136.98 mg/dL

## 2019-08-25 LAB — ALBUMIN: Albumin: 3.7 g/dL (ref 3.5–5.0)

## 2019-08-25 LAB — GLUCOSE, CAPILLARY
Glucose-Capillary: 136 mg/dL — ABNORMAL HIGH (ref 70–99)
Glucose-Capillary: 143 mg/dL — ABNORMAL HIGH (ref 70–99)
Glucose-Capillary: 129 mg/dL — ABNORMAL HIGH (ref 70–99)
Glucose-Capillary: 99 mg/dL (ref 70–99)

## 2019-08-25 LAB — CALCIUM: Calcium: 8.9 mg/dL (ref 8.9–10.3)

## 2019-08-25 SURGERY — THYROIDECTOMY
Anesthesia: General

## 2019-08-25 MED ORDER — ESOMEPRAZOLE MAGNESIUM 40 MG PO PACK: 40.0000 mg | PACK | Freq: Every day | ORAL | Status: DC

## 2019-08-25 MED ORDER — SERTRALINE HCL 50 MG PO TABS
100.0000 mg | ORAL_TABLET | Freq: Every day | ORAL | Status: DC
  Administered 2019-08-25 – 2019-08-26 (×2): 100 mg via ORAL
  Filled 2019-08-25 (×2): qty 2

## 2019-08-25 MED ORDER — IPRATROPIUM-ALBUTEROL 0.5-2.5 (3) MG/3ML IN SOLN
RESPIRATORY_TRACT | Status: AC
  Filled 2019-08-25: qty 3

## 2019-08-25 MED ORDER — SODIUM CHLORIDE 0.9 % IV SOLN
INTRAVENOUS | Status: DC
  Administered 2019-08-25 (×2): via INTRAVENOUS

## 2019-08-25 MED ORDER — INFLUENZA VAC A&B SA ADJ QUAD 0.5 ML IM PRSY
0.5000 mL | PREFILLED_SYRINGE | INTRAMUSCULAR | Status: AC
  Administered 2019-08-26: 0.5 mL via INTRAMUSCULAR
  Filled 2019-08-25: qty 0.5

## 2019-08-25 MED ORDER — PANTOPRAZOLE SODIUM 40 MG PO TBEC
40.0000 mg | DELAYED_RELEASE_TABLET | Freq: Every day | ORAL | Status: DC
  Administered 2019-08-26: 40 mg via ORAL
  Filled 2019-08-25: qty 1

## 2019-08-25 MED ORDER — IBUPROFEN 400 MG PO TABS: 600.0000 mg | ORAL_TABLET | Freq: Four times a day (QID) | ORAL | Status: DC | PRN

## 2019-08-25 MED ORDER — LOPERAMIDE HCL 2 MG PO CAPS: 2.0000 mg | ORAL_CAPSULE | ORAL | Status: DC | PRN

## 2019-08-25 MED ORDER — CHLORHEXIDINE GLUCONATE CLOTH 2 % EX PADS: 6.0000 | MEDICATED_PAD | Freq: Once | CUTANEOUS | Status: DC

## 2019-08-25 MED ORDER — FENTANYL CITRATE (PF) 100 MCG/2ML IJ SOLN
INTRAMUSCULAR | Status: AC
  Filled 2019-08-25: qty 2

## 2019-08-25 MED ORDER — ONDANSETRON HCL 4 MG/2ML IJ SOLN
INTRAMUSCULAR | Status: DC | PRN
  Administered 2019-08-25: 4 mg via INTRAVENOUS

## 2019-08-25 MED ORDER — TRAMADOL HCL 50 MG PO TABS
50.0000 mg | ORAL_TABLET | Freq: Four times a day (QID) | ORAL | Status: DC | PRN
  Administered 2019-08-25 – 2019-08-26 (×4): 50 mg via ORAL
  Filled 2019-08-25 (×4): qty 1

## 2019-08-25 MED ORDER — MONTELUKAST SODIUM 10 MG PO TABS
10.0000 mg | ORAL_TABLET | Freq: Every day | ORAL | Status: DC
  Administered 2019-08-25: 22:00:00 10 mg via ORAL
  Filled 2019-08-25: qty 1

## 2019-08-25 MED ORDER — SIMETHICONE 80 MG PO CHEW
40.0000 mg | CHEWABLE_TABLET | Freq: Four times a day (QID) | ORAL | Status: DC | PRN
  Filled 2019-08-25: qty 1

## 2019-08-25 MED ORDER — EVICEL 2 ML EX KIT
PACK | CUTANEOUS | Status: AC
  Filled 2019-08-25: qty 1

## 2019-08-25 MED ORDER — EVICEL 2 ML EX KIT
PACK | CUTANEOUS | Status: DC | PRN
  Administered 2019-08-25: 2 mL

## 2019-08-25 MED ORDER — CALCIUM CARBONATE ANTACID 500 MG PO CHEW
2.5000 | CHEWABLE_TABLET | Freq: Three times a day (TID) | ORAL | Status: DC
  Administered 2019-08-25 – 2019-08-26 (×3): 500 mg via ORAL
  Filled 2019-08-25 (×2): qty 3
  Filled 2019-08-25: qty 2.5

## 2019-08-25 MED ORDER — ONDANSETRON HCL 4 MG/2ML IJ SOLN: 4.0000 mg | Freq: Once | INTRAMUSCULAR | Status: DC | PRN

## 2019-08-25 MED ORDER — IPRATROPIUM-ALBUTEROL 0.5-2.5 (3) MG/3ML IN SOLN
3.0000 mL | RESPIRATORY_TRACT | Status: AC
  Administered 2019-08-25: 14:00:00 3 mL via RESPIRATORY_TRACT

## 2019-08-25 MED ORDER — HEMOSTATIC AGENTS (NO CHARGE) OPTIME
TOPICAL | Status: DC | PRN
  Administered 2019-08-25: 2 via TOPICAL

## 2019-08-25 MED ORDER — TORSEMIDE 10 MG PO TABS
10.0000 mg | ORAL_TABLET | Freq: Every day | ORAL | Status: DC
  Administered 2019-08-25 – 2019-08-26 (×2): 10 mg via ORAL
  Filled 2019-08-25 (×2): qty 1

## 2019-08-25 MED ORDER — LEVOTHYROXINE SODIUM 137 MCG PO TABS
137.0000 ug | ORAL_TABLET | Freq: Every day | ORAL | Status: DC
  Administered 2019-08-26: 05:00:00 137 ug via ORAL
  Filled 2019-08-25: qty 1

## 2019-08-25 MED ORDER — FLUTICASONE PROPIONATE 50 MCG/ACT NA SUSP
1.0000 | Freq: Every day | NASAL | Status: DC | PRN
  Filled 2019-08-25: qty 16

## 2019-08-25 MED ORDER — LOSARTAN POTASSIUM 50 MG PO TABS
100.0000 mg | ORAL_TABLET | Freq: Every day | ORAL | Status: DC
  Administered 2019-08-25 – 2019-08-26 (×2): 100 mg via ORAL
  Filled 2019-08-25 (×2): qty 2

## 2019-08-25 MED ORDER — AMLODIPINE BESYLATE 10 MG PO TABS
10.0000 mg | ORAL_TABLET | Freq: Every day | ORAL | Status: DC
  Administered 2019-08-26: 10 mg via ORAL
  Filled 2019-08-25: qty 1

## 2019-08-25 MED ORDER — PHENYLEPHRINE HCL (PRESSORS) 10 MG/ML IV SOLN
INTRAVENOUS | Status: DC | PRN
  Administered 2019-08-25 (×2): 100 ug via INTRAVENOUS

## 2019-08-25 MED ORDER — ROSUVASTATIN CALCIUM 10 MG PO TABS
40.0000 mg | ORAL_TABLET | Freq: Every day | ORAL | Status: DC
  Administered 2019-08-25: 40 mg via ORAL
  Filled 2019-08-25 (×2): qty 4

## 2019-08-25 MED ORDER — ALBUTEROL SULFATE HFA 108 (90 BASE) MCG/ACT IN AERS: 1.0000 | INHALATION_SPRAY | RESPIRATORY_TRACT | Status: DC | PRN

## 2019-08-25 MED ORDER — ALBUTEROL SULFATE (2.5 MG/3ML) 0.083% IN NEBU: 2.5000 mg | INHALATION_SOLUTION | RESPIRATORY_TRACT | Status: DC | PRN

## 2019-08-25 MED ORDER — SODIUM CHLORIDE (PF) 0.9 % IJ SOLN
INTRAMUSCULAR | Status: AC
  Filled 2019-08-25: qty 20

## 2019-08-25 MED ORDER — ACETAMINOPHEN 500 MG PO TABS
1000.0000 mg | ORAL_TABLET | ORAL | Status: AC
  Administered 2019-08-25: 08:00:00 1000 mg via ORAL

## 2019-08-25 MED ORDER — METFORMIN HCL ER 500 MG PO TB24
500.0000 mg | ORAL_TABLET | Freq: Every day | ORAL | Status: DC
  Administered 2019-08-25: 22:00:00 500 mg via ORAL
  Filled 2019-08-25 (×2): qty 1

## 2019-08-25 MED ORDER — POTASSIUM CHLORIDE CRYS ER 20 MEQ PO TBCR
20.0000 meq | EXTENDED_RELEASE_TABLET | Freq: Two times a day (BID) | ORAL | Status: DC
  Administered 2019-08-25 – 2019-08-26 (×2): 20 meq via ORAL
  Filled 2019-08-25 (×2): qty 1

## 2019-08-25 MED ORDER — NAPHAZOLINE-PHENIRAMINE 0.025-0.3 % OP SOLN
1.0000 [drp] | Freq: Two times a day (BID) | OPHTHALMIC | Status: DC | PRN
  Filled 2019-08-25: qty 5

## 2019-08-25 MED ORDER — INSULIN ASPART 100 UNIT/ML ~~LOC~~ SOLN: 0.0000 [IU] | Freq: Every day | SUBCUTANEOUS | Status: DC

## 2019-08-25 MED ORDER — FENTANYL CITRATE (PF) 100 MCG/2ML IJ SOLN
INTRAMUSCULAR | Status: DC | PRN
  Administered 2019-08-25: 50 ug via INTRAVENOUS
  Administered 2019-08-25: 25 ug via INTRAVENOUS
  Administered 2019-08-25: 50 ug via INTRAVENOUS

## 2019-08-25 MED ORDER — REMIFENTANIL HCL 1 MG IV SOLR
INTRAVENOUS | Status: DC | PRN
  Administered 2019-08-25: .08 ug/kg/min via INTRAVENOUS

## 2019-08-25 MED ORDER — INSULIN ASPART 100 UNIT/ML ~~LOC~~ SOLN
0.0000 [IU] | Freq: Three times a day (TID) | SUBCUTANEOUS | Status: DC
  Administered 2019-08-25: 3 [IU] via SUBCUTANEOUS
  Filled 2019-08-25: qty 1

## 2019-08-25 MED ORDER — MOMETASONE FURO-FORMOTEROL FUM 200-5 MCG/ACT IN AERO
2.0000 | INHALATION_SPRAY | Freq: Two times a day (BID) | RESPIRATORY_TRACT | Status: DC
  Administered 2019-08-25 – 2019-08-26 (×2): 2 via RESPIRATORY_TRACT
  Filled 2019-08-25: qty 8.8

## 2019-08-25 MED ORDER — IPRATROPIUM-ALBUTEROL 0.5-2.5 (3) MG/3ML IN SOLN
RESPIRATORY_TRACT | Status: AC
  Administered 2019-08-25: 16:00:00 3 mL via RESPIRATORY_TRACT
  Filled 2019-08-25: qty 3

## 2019-08-25 MED ORDER — ACETAMINOPHEN 500 MG PO TABS
ORAL_TABLET | ORAL | Status: AC
  Administered 2019-08-25: 08:00:00 1000 mg via ORAL
  Filled 2019-08-25: qty 1

## 2019-08-25 MED ORDER — FENTANYL CITRATE (PF) 100 MCG/2ML IJ SOLN
INTRAMUSCULAR | Status: AC
  Administered 2019-08-25: 12:00:00 25 ug via INTRAVENOUS
  Filled 2019-08-25: qty 2

## 2019-08-25 MED ORDER — FENTANYL CITRATE (PF) 100 MCG/2ML IJ SOLN
25.0000 ug | INTRAMUSCULAR | Status: DC | PRN
  Administered 2019-08-25 (×5): 25 ug via INTRAVENOUS

## 2019-08-25 MED ORDER — EPHEDRINE SULFATE 50 MG/ML IJ SOLN
INTRAMUSCULAR | Status: DC | PRN
  Administered 2019-08-25 (×2): 10 mg via INTRAVENOUS

## 2019-08-25 MED ORDER — PROPOFOL 10 MG/ML IV BOLUS
INTRAVENOUS | Status: DC | PRN
  Administered 2019-08-25: 40 mg via INTRAVENOUS
  Administered 2019-08-25: 120 mg via INTRAVENOUS

## 2019-08-25 MED ORDER — METOPROLOL TARTRATE 50 MG PO TABS
50.0000 mg | ORAL_TABLET | Freq: Two times a day (BID) | ORAL | Status: DC
  Administered 2019-08-25 – 2019-08-26 (×2): 50 mg via ORAL
  Filled 2019-08-25 (×2): qty 1

## 2019-08-25 MED ORDER — ONDANSETRON 4 MG PO TBDP: 4.0000 mg | ORAL_TABLET | Freq: Four times a day (QID) | ORAL | Status: DC | PRN

## 2019-08-25 MED ORDER — SODIUM CHLORIDE 0.9 % IV SOLN
INTRAVENOUS | Status: DC | PRN
  Administered 2019-08-25: 40 ug/min via INTRAVENOUS

## 2019-08-25 MED ORDER — MENTHOL 3 MG MT LOZG
1.0000 | LOZENGE | OROMUCOSAL | Status: DC | PRN
  Filled 2019-08-25: qty 9

## 2019-08-25 MED ORDER — IPRATROPIUM-ALBUTEROL 0.5-2.5 (3) MG/3ML IN SOLN
3.0000 mL | Freq: Once | RESPIRATORY_TRACT | Status: AC
  Administered 2019-08-25: 16:00:00 3 mL via RESPIRATORY_TRACT

## 2019-08-25 MED ORDER — ONDANSETRON HCL 4 MG/2ML IJ SOLN: 4.0000 mg | Freq: Four times a day (QID) | INTRAMUSCULAR | Status: DC | PRN

## 2019-08-25 MED ORDER — LIDOCAINE HCL (CARDIAC) PF 100 MG/5ML IV SOSY
PREFILLED_SYRINGE | INTRAVENOUS | Status: DC | PRN
  Administered 2019-08-25: 100 mg via INTRAVENOUS

## 2019-08-25 MED ORDER — REMIFENTANIL HCL 1 MG IV SOLR
INTRAVENOUS | Status: AC
  Filled 2019-08-25: qty 1000

## 2019-08-25 MED ORDER — HYDRALAZINE HCL 50 MG PO TABS
100.0000 mg | ORAL_TABLET | Freq: Two times a day (BID) | ORAL | Status: DC
  Administered 2019-08-25 – 2019-08-26 (×2): 100 mg via ORAL
  Filled 2019-08-25 (×2): qty 2

## 2019-08-25 MED ORDER — PNEUMOCOCCAL VAC POLYVALENT 25 MCG/0.5ML IJ INJ
0.5000 mL | INJECTION | INTRAMUSCULAR | Status: AC
  Administered 2019-08-26: 0.5 mL via INTRAMUSCULAR
  Filled 2019-08-25: qty 0.5

## 2019-08-25 MED ORDER — SUCCINYLCHOLINE CHLORIDE 20 MG/ML IJ SOLN
INTRAMUSCULAR | Status: DC | PRN
  Administered 2019-08-25: 100 mg via INTRAVENOUS

## 2019-08-25 MED ORDER — LACTATED RINGERS IV SOLN
INTRAVENOUS | Status: DC
  Administered 2019-08-25: 17:00:00 via INTRAVENOUS

## 2019-08-25 MED ORDER — ROCURONIUM BROMIDE 100 MG/10ML IV SOLN
INTRAVENOUS | Status: DC | PRN
  Administered 2019-08-25: 5 mg via INTRAVENOUS

## 2019-08-25 MED ORDER — IPRATROPIUM-ALBUTEROL 0.5-2.5 (3) MG/3ML IN SOLN: 3.0000 mL | RESPIRATORY_TRACT | Status: DC

## 2019-08-25 MED ORDER — ACETAMINOPHEN 500 MG PO TABS
1000.0000 mg | ORAL_TABLET | Freq: Four times a day (QID) | ORAL | Status: DC
  Administered 2019-08-25 – 2019-08-26 (×4): 1000 mg via ORAL
  Filled 2019-08-25 (×4): qty 2

## 2019-08-25 SURGICAL SUPPLY — 46 items
BACTOSHIELD CHG 4% 4OZ (MISCELLANEOUS) ×1
BASIN GRAD PLASTIC 32OZ STRL (MISCELLANEOUS) ×2 IMPLANT
BLADE SURG 15 STRL LF DISP TIS (BLADE) ×1 IMPLANT
BLADE SURG 15 STRL SS (BLADE) ×1
CANISTER SUCT 1200ML W/VALVE (MISCELLANEOUS) ×2 IMPLANT
CLIP VESOCCLUDE SM WIDE 6/CT (CLIP) ×2 IMPLANT
COVER WAND RF STERILE (DRAPES) ×2 IMPLANT
DERMABOND ADVANCED (GAUZE/BANDAGES/DRESSINGS) ×1
DERMABOND ADVANCED .7 DNX12 (GAUZE/BANDAGES/DRESSINGS) ×1 IMPLANT
DRAPE MAG INST 16X20 L/F (DRAPES) ×2 IMPLANT
DRAPE THYROID T SHEET (DRAPES) ×2 IMPLANT
ELECT CAUTERY BLADE TIP 2.5 (TIP) ×2
ELECT LARYNGEAL DUAL CHAN (ELECTRODE) ×1 IMPLANT
ELECT NEEDLE 20X.3 GREEN (MISCELLANEOUS) ×2
ELECT REM PT RETURN 9FT ADLT (ELECTROSURGICAL) ×2
ELECTRODE CAUTERY BLDE TIP 2.5 (TIP) ×1 IMPLANT
ELECTRODE NDL 20X.3 GREEN (MISCELLANEOUS) IMPLANT
ELECTRODE NEEDLE 20X.3 GREEN (MISCELLANEOUS) ×1 IMPLANT
ELECTRODE REM PT RTRN 9FT ADLT (ELECTROSURGICAL) ×1 IMPLANT
EVICEL AIRLESS SPRAY ACCES (MISCELLANEOUS) ×1 IMPLANT
GAUZE 4X4 16PLY RFD (DISPOSABLE) IMPLANT
GLOVE BIO SURGEON STRL SZ 6.5 (GLOVE) ×4 IMPLANT
GLOVE INDICATOR 7.0 STRL GRN (GLOVE) ×2 IMPLANT
GOWN STRL REUS W/ TWL LRG LVL3 (GOWN DISPOSABLE) ×2 IMPLANT
GOWN STRL REUS W/TWL LRG LVL3 (GOWN DISPOSABLE) ×2
KIT TURNOVER KIT A (KITS) ×2 IMPLANT
LABEL OR SOLS (LABEL) ×2 IMPLANT
NERVE STIMULATOR WAVEFORM 16S (NEUROSURGERY SUPPLIES)
NS IRRIG 500ML POUR BTL (IV SOLUTION) ×2 IMPLANT
PACK BASIN MINOR ARMC (MISCELLANEOUS) ×2 IMPLANT
PROBE NEUROSIGN BIPOL (MISCELLANEOUS) IMPLANT
PROBE NEUROSIGN BIPOLAR (MISCELLANEOUS) ×1
SCRUB CHG 4% DYNA-HEX 4OZ (MISCELLANEOUS) ×1 IMPLANT
SHEARS HARMONIC 9CM CVD (BLADE) ×1 IMPLANT
SPONGE KITTNER 5P (MISCELLANEOUS) ×2 IMPLANT
STIMULATOR NERVE WAVEFORM 16S (NEUROSURGERY SUPPLIES) IMPLANT
STRIP CLOSURE SKIN 1/2X4 (GAUZE/BANDAGES/DRESSINGS) ×2 IMPLANT
SURGICEL SNOW 2X4 (HEMOSTASIS) ×3 IMPLANT
SUT MNCRL AB 4-0 PS2 18 (SUTURE) IMPLANT
SUT PROLENE 4 0 PS 2 18 (SUTURE) ×2 IMPLANT
SUT SILK 2 0 (SUTURE) ×3
SUT SILK 2-0 18XBRD TIE 12 (SUTURE) ×1 IMPLANT
SUT SILK 2-0 30XBRD TIE 12 (SUTURE) ×1 IMPLANT
SUT VIC AB 4-0 RB1 27 (SUTURE) ×1
SUT VIC AB 4-0 RB1 27X BRD (SUTURE) ×1 IMPLANT
SYR BULB IRRIG 60ML STRL (SYRINGE) ×2 IMPLANT

## 2019-08-25 NOTE — Interval H&P Note (Signed)
History and Physical Interval Note:  08/25/2019 8:37 AM  [name]  has presented today for surgery, with the diagnosis of Multinodular Goiter.  The various methods of treatment have been discussed with the patient and family. After consideration of risks, benefits and other options for treatment, the patient has consented to  Procedure(s) with comments: THYROIDECTOMY (N/A) - With Nerve Monitoring(RLN) as a surgical intervention.  The patient's history has been reviewed, patient examined, no change in status, stable for surgery.  I have reviewed the patient's chart and labs.  Questions were answered to the patient's satisfaction.     Fredirick Maudlin

## 2019-08-25 NOTE — Op Note (Signed)
Operative Note  Preoperative Diagnosis:  a substernal goiter  Postoperative Diagnosis:  a substernal goiter  Operation:  Parathyroid Autotransplant x 1 and Total Thyroidectomy with Cervical Extraction of Substernal Component  Surgeon: Fredirick Maudlin, MD  Assistant: Olean Ree, MD (a second surgeon was necessary for technical expertise and exposure)  Anesthesia: General endotracheal with nerve monitoring system  Findings: Highly vascular and multinodular goiter extending beneath the clavicles on both sides.  The dominant nodule was within the isthmus.  There was a small pyramidal lobe.  The left inferior parathyroid gland was devascularized and required autotransplantation at the end of the operation.  Remaining 3 were visualized and preserved in situ.  Both recurrent laryngeal nerves were clearly visualized and gave good functional signals throughout the operation.  Indications: This is a 78 year old woman with a longstanding multinodular goiter.  She has been experiencing worsening compressive symptoms and desired surgical resection.  Procedure In Detail: The patient was identified in the preoperative holding area and brought to the operating room where she was placed supine on the OR table.  All bony prominences were padded and bilateral sequential compression devices were placed on the lower extremities.  General endotracheal anesthesia was induced using the nerve monitoring system.  Tube placement was verified with a fiberoptic laryngoscope.  The grounding lead was placed and the patient was positioned appropriately for the operation.  She was sterilely prepped and draped in standard fashion.  A timeout was performed confirming the patient's identity, the procedure being performed, her allergies, all necessary equipment was available, and that maintenance anesthesia was adequate.  A 5 cm transverse incision was made in a natural skin fold that was appropriately positioned for the  operation.  This was carried down through the skin and subcutaneous tissues using electrocautery.  Subplatysmal flaps were elevated and the strap muscles were divided in the median raphae.  We encountered some bleeding from an anterior jugular vein.  This was tied off for hemostasis.  We elevated the strap muscles off of the left lobe of the thyroid.  The middle thyroid vein was divided with silk ties and the harmonic scalpel.  We then exposed the superior pole vessels.  These were sequentially isolated and divided with silk ties and the harmonic scalpel.  The superior parathyroid gland was identified and dissected off the capsule of the thyroid.  It was preserved on a good vascular pedicle.  We then rotated the thyroid medially.  In order to do so, I performed a finger sweep and at the clavicle to elevate the substernal component up into her field.  The tracheal esophageal groove was opened.  The recurrent laryngeal nerve was identified.  It gave a good functional signal when stimulated.  The trachea was exposed medial to the nerve and the inferior pole vessels were divided.  The nerve was dissected up to its insertion point at the cricothyroid muscle.  The ligament of Berry and anterior suspensory ligament were divided.  The gland was dissected off of the trachea and across the midline.  As we did this, I noticed a parathyroid gland clinging to the inferior aspect of the thyroid capsule.  It was excised and placed in cold saline for autotransplantation at the end of the case.  There was also a small pyramidal lobe that was dissected off of the underlying musculature and divided at its most superior aspect.  Turned our attention to the right and proceeded similarly.  The strap muscles were elevated off the lobe and retracted laterally.  The middle thyroid vein was divided.  The superior pole vessels were sequentially isolated and divided with silk ties and the harmonic scalpel.  The superior parathyroid gland was  identified and preserved on a good vascular pedicle.  I then rotated the thyroid medially.  Once again, a finger sweep near the clavicle was required to elevate the lobe up into the surgical field.  The inferior parathyroid gland was visualized.  It was carefully dissected off the capsule of the thyroid and preserved on a good vascular pedicle.  I then dissected into the lateral fibrofatty tissues of the central neck and identified the recurrent laryngeal nerve.  It gave a good signal when stimulated.  The trachea was exposed medial to the nerve and the inferior pole vessels were divided.  The nerve was dissected up to its insertion point at the cricothyroid muscle.  It ran deep to the thyroid beneath a large tubercle of Zuckerkandl.  The remaining attachments of the thyroid to the trachea were divided and the gland was completely excised.  We thoroughly irrigated our wound beds.  Valsalva maneuvers from the anesthesia team confirmed no ongoing surgical bleeding.  We applied SNoW and Evicel for additional hemostatic effects.  The strap muscles were closed in the midline with running 4-0 Vicryl.  The preserve parathyroid tissue was then brought into the field.  It was divided into multiple small pieces which were then crosshatched to increase their surface area.  We made 3 pockets in the left sternocleidomastoid muscle.  Into each pocket, a piece of crosshatched parathyroid tissue was placed.  Each pocket was closed and marked with ligaclips.  The platysma was then closed with interrupted Vicryl.  The skin was closed with running subcuticular Prolene.  The skin was cleaned, Dermabond and Steri-Strips were applied.  The Prolene suture was removed.  The patient was awakened, extubated, and taken to the postanesthesia care unit in good condition.  EBL: 75 cc  IVF: 800 cc of crystalloid  Specimen(s): Total thyroid to pathology for permanent evaluation  Complications: none immediately apparent.   Counts: all  needles, instruments, and sponges were counted and reported to be correct in number at the end of the case.   I was present for and participated in the entire operation.  Fredirick Maudlin 11:22 AM

## 2019-08-25 NOTE — Transfer of Care (Signed)
Immediate Anesthesia Transfer of Care Note  Patient: [name]  Procedure(s) Performed: THYROIDECTOMY EXTRACTION OF SUBTOTAL COMPONENT; PARATHYROID AUTOTRANSPLANT X1 (N/A )  Patient Location: PACU  Anesthesia Type:General  Level of Consciousness: sedated  Airway & Oxygen Therapy: Patient Spontanous Breathing and Patient connected to nasal cannula oxygen  Post-op Assessment: Report given to RN and Post -op Vital signs reviewed and stable  Post vital signs: Reviewed and stable  Last Vitals:  Vitals Value Taken Time  BP 114/51 08/25/19 1113  Temp 36.5 C 08/25/19 1113  Pulse 72 08/25/19 1113  Resp 14 08/25/19 1113  SpO2 91 % 08/25/19 1113  Vitals shown include unvalidated device data.  Last Pain:  Vitals:   08/25/19 1113  PainSc: 0-No pain         Complications: No apparent anesthesia complications

## 2019-08-25 NOTE — Progress Notes (Signed)
Oxygen 92% on 2L [place]. Pt states she uses CPAP at home during the day when napping. Respiratory placed home CPAP on pt. Oxygen 92-94%. I spoke with Dr. Marcello Moores who instructed to give duoneb once.  Duoneb given. Pt continues 92-93% on 2L [place] and CPAP. Notified Dr. Marcello Moores who will come to the bedside.

## 2019-08-25 NOTE — Anesthesia Preprocedure Evaluation (Signed)
Anesthesia Evaluation  Patient identified by MRN, date of birth, ID band Patient awake    Reviewed: Allergy & Precautions, NPO status , Patient's Chart, lab work & pertinent test results, reviewed documented beta blocker date and time   Airway Mallampati: III  TM Distance: >3 FB     Dental  (+) Chipped   Pulmonary shortness of breath, asthma , sleep apnea and Continuous Positive Airway Pressure Ventilation ,           Cardiovascular hypertension,      Neuro/Psych    GI/Hepatic GERD  Controlled,(+) Hepatitis -  Endo/Other  diabetes, Type 2Morbid obesity  Renal/GU Renal InsufficiencyRenal disease     Musculoskeletal  (+) Arthritis ,   Abdominal   Peds  Hematology   Anesthesia Other Findings Will require nerve monitor. EKG ok. EF 60-65 5 yr ago. Tends to run a low sat.  Reproductive/Obstetrics                             Anesthesia Physical Anesthesia Plan  ASA: III  Anesthesia Plan: General   Post-op Pain Management:    Induction: Intravenous  PONV Risk Score and Plan:   Airway Management Planned: Oral ETT  Additional Equipment:   Intra-op Plan:   Post-operative Plan:   Informed Consent: I have reviewed the patients History and Physical, chart, labs and discussed the procedure including the risks, benefits and alternatives for the proposed anesthesia with the patient or authorized representative who has indicated his/her understanding and acceptance.       Plan Discussed with: CRNA  Anesthesia Plan Comments:         Anesthesia Quick Evaluation

## 2019-08-25 NOTE — Progress Notes (Signed)
Dr. Kayleen Memos at bedside to see pt. Dr. Kayleen Memos stated that with pt being on 2 liters oxygen via nasal cannula. Dr. Kayleen Memos stated he wanted one duoneb treatment before pt went to room.

## 2019-08-25 NOTE — Anesthesia Postprocedure Evaluation (Signed)
Anesthesia Post Note  Patient: [name]  Procedure(s) Performed: THYROIDECTOMY EXTRACTION OF SUBTOTAL COMPONENT; PARATHYROID AUTOTRANSPLANT X1 (N/A )  Patient location during evaluation: PACU Anesthesia Type: General Level of consciousness: awake and alert Pain management: pain level controlled Vital Signs Assessment: post-procedure vital signs reviewed and stable Respiratory status: spontaneous breathing, nonlabored ventilation, respiratory function stable and patient connected to nasal cannula oxygen Cardiovascular status: blood pressure returned to baseline and stable Postop Assessment: no apparent nausea or vomiting Anesthetic complications: no     Last Vitals:  Vitals:   08/25/19 1240 08/25/19 1248  BP: (!) 145/54   Pulse: 62   Resp: 18   Temp:    SpO2: 92% 95%    Last Pain:  Vitals:   08/25/19 1248  PainSc: [place]

## 2019-08-25 NOTE — Anesthesia Post-op Follow-up Note (Signed)
Anesthesia QCDR form completed.        

## 2019-08-25 NOTE — Anesthesia Procedure Notes (Signed)
Procedure Name: Intubation Date/Time: 08/25/2019 9:02 AM Performed by: Hedda Slade, CRNA Pre-anesthesia Checklist: Patient identified, Patient being monitored, Timeout performed, Emergency Drugs available and Suction available Patient Re-evaluated:Patient Re-evaluated prior to induction Oxygen Delivery Method: Circle system utilized Preoxygenation: Pre-oxygenation with 100% oxygen Induction Type: IV induction Ventilation: Mask ventilation without difficulty Laryngoscope Size: 3 and McGraph Grade View: Grade I Tube type: Oral Tube size: 7.0 mm Number of attempts: 1 Airway Equipment and Method: Stylet Placement Confirmation: ETT inserted through vocal cords under direct vision,  positive ETCO2 and breath sounds checked- equal and bilateral Secured at: 19 cm Tube secured with: Tape Dental Injury: Teeth and Oropharynx as per pre-operative assessment

## 2019-08-26 ENCOUNTER — Encounter: Payer: Self-pay | Admitting: General Surgery

## 2019-08-26 DIAGNOSIS — C73 Malignant neoplasm of thyroid gland: Secondary | ICD-10-CM | POA: Diagnosis not present

## 2019-08-26 LAB — GLUCOSE, CAPILLARY
Glucose-Capillary: 113 mg/dL — ABNORMAL HIGH (ref 70–99)
Glucose-Capillary: 117 mg/dL — ABNORMAL HIGH (ref 70–99)
Glucose-Capillary: 131 mg/dL — ABNORMAL HIGH (ref 70–99)

## 2019-08-26 LAB — CALCIUM: Calcium: 8.5 mg/dL — ABNORMAL LOW (ref 8.9–10.3)

## 2019-08-26 LAB — ALBUMIN: Albumin: 3.4 g/dL — ABNORMAL LOW (ref 3.5–5.0)

## 2019-08-26 MED ORDER — IBUPROFEN 600 MG PO TABS: 600.0000 mg | ORAL_TABLET | Freq: Four times a day (QID) | ORAL | 0 refills | Status: DC | PRN

## 2019-08-26 MED ORDER — AZITHROMYCIN 250 MG PO TABS: ORAL_TABLET | ORAL | 0 refills | Status: DC

## 2019-08-26 MED ORDER — CALCIUM CARBONATE ANTACID 500 MG PO CHEW: 2.5000 | CHEWABLE_TABLET | Freq: Three times a day (TID) | ORAL | Status: DC

## 2019-08-26 MED ORDER — OSCAL 500/200 D-3 500-200 MG-UNIT PO TABS: 1.0000 | ORAL_TABLET | Freq: Three times a day (TID) | ORAL | 0 refills | Status: DC

## 2019-08-26 MED ORDER — LEVOTHYROXINE SODIUM 137 MCG PO TABS: 137.0000 ug | ORAL_TABLET | Freq: Every day | ORAL | 1 refills | Status: DC

## 2019-08-26 MED ORDER — TRAMADOL HCL 50 MG PO TABS: 50.0000 mg | ORAL_TABLET | Freq: Four times a day (QID) | ORAL | 0 refills | Status: DC | PRN

## 2019-08-26 NOTE — Discharge Summary (Addendum)
[facility] SURGICAL DISCHARGE SUMMARY   Patient ID: [name] MRN: [id number] DOB/AGE: [birth year] 78 y.o.  Admit date: 08/25/2019 Discharge date: 08/26/2019  Discharge Diagnoses Patient Active Problem List   Diagnosis Date Noted  . 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/25/2019  . Multinodular goiter 07/31/2019    Consultants None  Procedures 08/25/2019:  Total Thyroidectomy  HPI: This is a 78 year old woman with a longstanding multinodular goiter.  She has been experiencing worsening compressive symptoms and desired surgical resection.  Hospital Course: Informed consent was obtained and documented, and patient underwent uneventful total thyroidectomy (Dr Celine Ahr, 08/25/2019).  Post-operatively, patient's did well and her corrected calcium remained close to normal ranges.  Advancement of patient's diet and ambulation were well-tolerated. The remainder of patient's hospital course was essentially unremarkable, and discharge planning was initiated accordingly with patient safely able to be discharged home with appropriate discharge instructions, antibiotics (Azithromycin x7 days for bronchitis), pain control, and outpatient follow-up after all of her questions were answered to her expressed satisfaction.   Discharge Condition: Good   Physical Examination:  Constitutional: Well appearing female, NAD Pulmonary: Normal effort, no respiratory distress, slightly productive cough and upper airway sounds Skin: Incision to anterior neck is CDI with steri-strips, no erythema or drainage   Allergies as of 08/26/2019      Reactions   Ace Inhibitors    Other reaction(s): Unknown   Eggs Or Egg-derived Products Diarrhea   Other    Other reaction(s): Other (See Comments) Eggs   Prednisone    Other reaction(s): Other (See Comments) joint pain   Risedronate    Other reaction(s): Other (See Comments)   Sulfa Antibiotics    Other reaction(s): Other (See Comments)    Sulfasalazine Other (See Comments)      Medication List    TAKE these medications   acetaminophen 500 MG tablet Commonly known as: TYLENOL Take 1,000 mg by mouth every 6 (six) hours as needed (pain).   albuterol 108 (90 Base) MCG/ACT inhaler Commonly known as: VENTOLIN HFA Inhale 1-2 puffs into the lungs every 4 (four) hours as needed for wheezing or shortness of breath.   amLODipine 10 MG tablet Commonly known as: NORVASC Take 10 mg by mouth daily.   azithromycin 250 MG tablet Commonly known as: Zithromax Take 500 mg (two tablets) once daily on the first day followed by 250 mg (one tablet) orally per day for the next 5 days for bronchitis   CALCIUM & VIT D3 BONE HEALTH PO Take 1 tablet by mouth daily. 600 mg/ 25 mg   calcium carbonate 500 MG chewable tablet Commonly known as: TUMS - dosed in mg elemental calcium Chew 2.5 tablets (500 mg of elemental calcium total) by mouth 3 (three) times daily with meals.   esomeprazole 40 MG packet Commonly known as: NEXIUM Take 40 mg by mouth daily before breakfast.   fluticasone 50 MCG/ACT nasal spray Commonly known as: FLONASE Place 1 spray into both nostrils daily as needed for allergies.   Fluticasone-Salmeterol 250-50 MCG/DOSE Aepb Commonly known as: ADVAIR Inhale 1 puff into the lungs 2 (two) times daily as needed (asthma).   hydrALAZINE 100 MG tablet Commonly known as: APRESOLINE Take 100 mg by mouth 2 (two) times daily.   ibuprofen 600 MG tablet Commonly known as: ADVIL Take 1 tablet (600 mg total) by mouth every 6 (six) hours as needed (for mild pain not relieved by other medications.).   levothyroxine 137 MCG tablet Commonly known as: SYNTHROID Take 1 tablet (137  mcg total) by mouth daily at 6 (six) AM. Start taking on: August 27, 2019   loperamide 2 MG capsule Commonly known as: IMODIUM Take 2 mg by mouth as needed for diarrhea or loose stools.   losartan 100 MG tablet Commonly known as: COZAAR Take 100 mg  by mouth daily.   metFORMIN 500 MG 24 hr tablet Commonly known as: GLUCOPHAGE-XR Take 500 mg by mouth at bedtime.   metoprolol tartrate 50 MG tablet Commonly known as: LOPRESSOR Take 50 mg by mouth 2 (two) times daily.   montelukast 10 MG tablet Commonly known as: SINGULAIR Take 10 mg by mouth at bedtime.   Opcon-A 0.027-0.315 % Soln Generic drug: Naphazoline-Pheniramine Place 1 drop into both eyes 2 (two) times daily as needed (dry eyes).   Oscal 500/200 D-3 500-200 MG-UNIT tablet Generic drug: calcium-vitamin D Take 1 tablet by mouth 3 (three) times daily.   potassium chloride SA 20 MEQ tablet Commonly known as: KLOR-CON Take 20 mEq by mouth 2 (two) times daily.   rosuvastatin 40 MG tablet Commonly known as: CRESTOR Take 40 mg by mouth daily.   sertraline 100 MG tablet Commonly known as: ZOLOFT Take 100 mg by mouth daily.   torsemide 10 MG tablet Commonly known as: DEMADEX Take 10-20 mg by mouth daily.   traMADol 50 MG tablet Commonly known as: ULTRAM Take 1 tablet (50 mg total) by mouth every 6 (six) hours as needed (mild pain).        Follow-up Information    Fredirick Maudlin, MD. Schedule an appointment as soon as possible for a visit in 2 week(s).   Specialty: General Surgery Why: s/p total thyroidectomy Contact information: [place] [address] [phone]            Time spent on discharge management including discussion of hospital course, clinical condition, outpatient instructions, prescriptions, and follow up with the patient and members of the medical team: >30 minutes  -- Edison Simon , PA-C [facility]  08/26/2019, 12:45 PM [phone] M-F: 7am - 4pm  Patient should take EITHER Tums or Os-Cal as above; does not need both.  I saw and evaluated the patient.  I agree with the above documentation, exam, and plan, which I have edited where appropriate. Fredirick Maudlin  12:59 PM

## 2019-08-26 NOTE — Progress Notes (Signed)
[name] and O x4. VSS. Pt tolerating diet well. No complaints of nausea or vomiting. IV removed intact, prescriptions given. Pt voices understanding of discharge instructions with no further questions. Patient discharged via wheelchair with RN  Allergies as of 08/26/2019      Reactions   Ace Inhibitors    Other reaction(s): Unknown   Eggs Or Egg-derived Products Diarrhea   Other    Other reaction(s): Other (See Comments) Eggs   Prednisone    Other reaction(s): Other (See Comments) joint pain   Risedronate    Other reaction(s): Other (See Comments)   Sulfa Antibiotics    Other reaction(s): Other (See Comments)   Sulfasalazine Other (See Comments)      Medication List    TAKE these medications   acetaminophen 500 MG tablet Commonly known as: TYLENOL Take 1,000 mg by mouth every 6 (six) hours as needed (pain).   albuterol 108 (90 Base) MCG/ACT inhaler Commonly known as: VENTOLIN HFA Inhale 1-2 puffs into the lungs every 4 (four) hours as needed for wheezing or shortness of breath.   amLODipine 10 MG tablet Commonly known as: NORVASC Take 10 mg by mouth daily.   azithromycin 250 MG tablet Commonly known as: Zithromax Take 500 mg (two tablets) once daily on the first day followed by 250 mg (one tablet) orally per day for the next 5 days for bronchitis   CALCIUM & VIT D3 BONE HEALTH PO Take 1 tablet by mouth daily. 600 mg/ 25 mg   calcium carbonate 500 MG chewable tablet Commonly known as: TUMS - dosed in mg elemental calcium Chew 2.5 tablets (500 mg of elemental calcium total) by mouth 3 (three) times daily with meals.   esomeprazole 40 MG packet Commonly known as: NEXIUM Take 40 mg by mouth daily before breakfast.   fluticasone 50 MCG/ACT nasal spray Commonly known as: FLONASE Place 1 spray into both nostrils daily as needed for allergies.   Fluticasone-Salmeterol 250-50 MCG/DOSE Aepb Commonly known as: ADVAIR Inhale 1 puff into the lungs 2 (two) times daily  as needed (asthma).   hydrALAZINE 100 MG tablet Commonly known as: APRESOLINE Take 100 mg by mouth 2 (two) times daily.   ibuprofen 600 MG tablet Commonly known as: ADVIL Take 1 tablet (600 mg total) by mouth every 6 (six) hours as needed (for mild pain not relieved by other medications.).   levothyroxine 137 MCG tablet Commonly known as: SYNTHROID Take 1 tablet (137 mcg total) by mouth daily at 6 (six) AM. Start taking on: August 27, 2019   loperamide 2 MG capsule Commonly known as: IMODIUM Take 2 mg by mouth as needed for diarrhea or loose stools.   losartan 100 MG tablet Commonly known as: COZAAR Take 100 mg by mouth daily.   metFORMIN 500 MG 24 hr tablet Commonly known as: GLUCOPHAGE-XR Take 500 mg by mouth at bedtime.   metoprolol tartrate 50 MG tablet Commonly known as: LOPRESSOR Take 50 mg by mouth 2 (two) times daily.   montelukast 10 MG tablet Commonly known as: SINGULAIR Take 10 mg by mouth at bedtime.   Opcon-[name] 0.027-0.315 % Soln Generic drug: Naphazoline-Pheniramine Place 1 drop into both eyes 2 (two) times daily as needed (dry eyes).   Oscal 500/200 D-3 500-200 MG-UNIT tablet Generic drug: calcium-vitamin D Take 1 tablet by mouth 3 (three) times daily.   potassium chloride SA 20 MEQ tablet Commonly known as: KLOR-CON Take 20 mEq by mouth 2 (two) times daily.   rosuvastatin  40 MG tablet Commonly known as: CRESTOR Take 40 mg by mouth daily.   sertraline 100 MG tablet Commonly known as: ZOLOFT Take 100 mg by mouth daily.   torsemide 10 MG tablet Commonly known as: DEMADEX Take 10-20 mg by mouth daily.   traMADol 50 MG tablet Commonly known as: ULTRAM Take 1 tablet (50 mg total) by mouth every 6 (six) hours as needed (mild pain).       Vitals:   08/26/19 1205 08/26/19 1359  BP: (!) 150/54   Pulse: (!) 58   Resp:    Temp: 98.3 F (36.8 C)   SpO2: 97% 94%    [name]

## 2019-08-26 NOTE — Discharge Instructions (Signed)
Post-operative Home Care After Thyroid or Parathyroid Surgery  What should I expect after my operation?    You will see swelling under the incision and/or bruising under it in a few days. This is usually greatest on the second or third day after the operation. You may also feel the sensation of swelling and/or of firmness that can last for a month or more.     Neck incisions heal rapidly, within a week or two. You can get them damp after about 24 hours, however do not submerge the incision or allow it to become soaked or saturated with water or sweat for 2 weeks.    Your scar will be most visible 1-2 months after the operation and gradually fade over the next 6-8 months. As it heals, a scar looks more pink or red than the skin around it.    You may feel a firm healing ridge directly under the incision. This is normal and will soften and go away when healing is complete within 3-6 months.    All incisions are sensitive to sunlight.  For one year after surgery, you should use sunscreen when outdoors for long periods to prevent darkening of the scar area.     We recommend that you not expose the incision to the ultraviolet lights used in tanning booths.    Will my neck hurt?   Most patients experience very little pain, but you may feel some neck stiffness/soreness in your shoulder, back or neck and tension headache that takes a few days or weeks to go away completely.     Some patients also notice minor changes in swallowing, which improve over time.    The skin just above and below your incision will feel numb. This will improve over several months, but some persons may have a longterm decrease in sensation.     You may apply cold pack over your incision to improve the pain.    How will I manage my pain at home?   Take NSAIDS like ibuprofen (Motrin, Advil), naproxen (Naprosyn, Aleve) or acetaminophen (Tylenol) every 6 hours for the first 3-5 days after operation. This will  minimize the pain you feel.      ? To prevent Tylenol overdose, do not take a Tylenol doses at the same time as a combination narcotic dose that contains Tylenol, like Vicodin and Darvocet. However, You may take them 4-6 hours apart.      If you have sore/stiff muscles in your back, shoulder or neck, you may use moist warm heat or heating pad on the affected areas 15-20 minutes at a time several times a day.    Gently massaging your neck muscles will improve the neck stiffness.     Do not be afraid to move your neck. Gently flexing and stretching your neck muscles will prevent stiffness.     Stronger pain medication or narcotic (like Vicodin, Darvocet) for severe pain is rarely needed. If it is, however, DO NOT drive a car or drink alcohol while taking these medications.     Narcotics also cause constipation. Stool softeners (Colace) and fiber (fruits, bran, vegetables) and extra fluid intake helps. A stimulant laxative (Milk of Magnesia, Senokot) may be needed as well.    Will my voice be affected?  Your voice may be hoarse or weak at first, because the surgery took place near the voice box, but usually recovers within weeks. Some patients also notice a change in the pitch of their voices that affects  singing. Rarely, these changes can be permanent.   Are there any diet restrictions?  No, return to your previous diet and always eat a well balanced diet, low in fat, etc.    How will I care for my incision?   If you have paper steri strips on your incision, leave them in place until they begin to fall off naturally. If they become discolored or messy, you may remove them 7-10 days after your operation.    If you have a skin glue (Dermabond) closure, you may notice tiny pieces of yellow material on your washcloth. Any sutures (stitches) you may have are dissolvable and do not need to be removed.   You may shower then gently pat dry your incision.    Do not apply ointments or  powders.    Avoid using Vitamin E cream or other moisturizers on the incision until after your first follow-up visit.   What new medications might I take home?   ? Calcium supplement:  Your bodys calcium levels may fall after a total thyroidectomy or parathyroid operation. We recommend you purchase Os-Cal 500 (one tablet equals 500 mg of elemental calcium) or Citracal (2 tablets equal 630 mg of elemental calcium; the "Petites" version contains 500 mg elemental calcium per 2 tablets). You may be taking 3-6 (or more) tablets per day, depending on your doctors recommendation. You will need to take calcium at different times to avoid medication interaction. Ask your pharmacists, nurse, or doctor about specific interactions.   ? Thyroid Hormone:  If you have had a thyroid operation, you may be prescribed thyroid hormone replacement, called levothyroxine (Synthroid, Levothroid, etc.). A blood test will be done in 6-8 weeks to ensure the dosage is correct  by your doctor or your surgeon.   ? Vitamin D:  If your doctor has prescribed a Vitamin D supplement, like Calcitriol (Rocaltrol), try to get it filled at the hospital pharmacy before you leave.  Sometimes regular pharmacies do not stock it and will need to order it in.  When can I go back to normal activities?   You may return to work in 5-7 days or sooner if desired. Contact the clinic coordinator if you need employer forms completed.    You may drive as long as you are not taking any narcotics and your neck stiffness is resolved    Can I resume my previous medications?    Yes, unless directed not to by your doctor.    Before discharge, be sure to review your previous medications with your doctor or inpatient medical team.    When do I call for advice?   - If your temperature > 101.5  - You have trouble talking or breathing  - Your fingers/ hands or face or around your lips becomes numb and tingling. (This may mean your calcium level is  low.)  - You have trouble swallowing  - Your incision becomes swollen, red or drainage occurs.

## 2019-08-26 NOTE — Care Management Obs Status (Signed)
[facility] NOTIFICATION   Patient Details  Name: JIA DOTTAVIO MRN: [id number] Date of Birth: 02/26/1941   Medicare Observation Status Notification Given:  Yes    Beverly Sessions, RN 08/26/2019, 3:02 PM

## 2019-08-26 NOTE — Progress Notes (Signed)
SATURATION QUALIFICATIONS: (This note is used to comply with regulatory documentation for home oxygen)  Patient Saturations on Room Air at Rest = 94%  Patient Saturations on Room Air while Ambulating = 91/93%   Please briefly explain why patient needs home oxygen: Patient does not qualify for home oxygen use.

## 2019-08-27 LAB — SURGICAL PATHOLOGY

## 2019-09-01 DIAGNOSIS — E039 Hypothyroidism, unspecified: Secondary | ICD-10-CM | POA: Insufficient documentation

## 2019-09-02 ENCOUNTER — Telehealth: Payer: Self-pay | Admitting: General Surgery

## 2019-09-02 NOTE — Telephone Encounter (Signed)
Spoke w/pt and discussed pathology.  MicroPTC found.  Patient reassured that this is of no clinical significance. She's otherwise doing well post-op.

## 2019-09-09 ENCOUNTER — Ambulatory Visit (INDEPENDENT_AMBULATORY_CARE_PROVIDER_SITE_OTHER): Payer: Medicare Other | Admitting: General Surgery

## 2019-09-09 ENCOUNTER — Encounter: Payer: Self-pay | Admitting: General Surgery

## 2019-09-09 ENCOUNTER — Other Ambulatory Visit: Payer: Self-pay

## 2019-09-09 VITALS — BP 175/62 | HR 69 | Temp 97.3°F | Resp 14 | Ht 62.0 in | Wt 208.2 lb

## 2019-09-09 DIAGNOSIS — E890 Postprocedural hypothyroidism: Secondary | ICD-10-CM

## 2019-09-09 NOTE — Progress Notes (Signed)
[name] is here today for a postoperative visit.  She is a 78 year old woman woman who underwent a total thyroidectomy on August 25, 2019 for compressive symptoms related to a multinodular goiter. There was a substernal component that was removed via the cervical incision.  One parathyroid gland was devascularized and required autotransplantation.  The final pathology revealed:  SURGICAL PATHOLOGY  CASE: [id number]  PATIENT: [facility] [place]  Surgical Pathology Report      Specimen Submitted:  A. Total Thyroid   Clinical History: Multinodular goiter.     DIAGNOSIS:  A. THYROID GLAND, TOTAL THYROIDECTOMY:  - INCIDENTAL PAPILLARY MICROCARCINOMA, 2 MM.  - BACKGROUND THYROID TISSUE WITH LYMPHOCYTIC THYROIDITIS AND  MULTINODULAR HYPERPLASIA.  - NO PARATHYROID TISSUE IDENTIFIED.   CANCER CASE SUMMARY: THYROID GLAND  Procedure: Total thyroidectomy  Tumor Focality: Unifocal  Tumor Site: Right lobe  Tumor Size: 0.2 cm in greatest dimension  Histologic Type: Papillary carcinoma, classic  Margins: Uninvolved by carcinoma  Angioinvasion (Vascular Invasion): Not identified  Lymphatic Invasion: Not identified  Extrathyroidal Extension: Not identified  Regional Lymph Nodes: No lymph nodes submitted or found  Pathologic Stage Classification (pTNM, AJCC 8th Edition): pT1 pNX  TNM Descriptors:    This was discussed with her over the phone.  She was reassured that the 2 mm microcarcinoma is of no clinical significance and does not require additional treatment or monitoring.  She has done well from a surgical standpoint, although she has had some additional lung issues for which she has seen her primary care provider.  She denies any difficulty swallowing.  Her voice is normal.  She denies any paresthesias or other symptoms of low calcium.  She has been taking Os-Cal 2 tablets 3 times daily.  She is tolerating her thyroid medication without difficulty.  Current Meds  Medication Sig  .  acetaminophen (TYLENOL) 500 MG tablet Take 1,000 mg by mouth every 6 (six) hours as needed (pain).  Marland Kitchen albuterol (PROVENTIL HFA;VENTOLIN HFA) 108 (90 BASE) MCG/ACT inhaler Inhale 1-2 puffs into the lungs every 4 (four) hours as needed for wheezing or shortness of breath.  Marland Kitchen amLODipine (NORVASC) 10 MG tablet Take 10 mg by mouth daily.  . calcium carbonate (TUMS - DOSED IN MG ELEMENTAL CALCIUM) 500 MG chewable tablet Chew 2.5 tablets (500 mg of elemental calcium total) by mouth 3 (three) times daily with meals.  . calcium-vitamin D (OSCAL 500/200 D-3) 500-200 MG-UNIT tablet Take 1 tablet by mouth 3 (three) times daily.  Marland Kitchen esomeprazole (NEXIUM) 40 MG packet Take 40 mg by mouth daily before breakfast.  . fluticasone (FLONASE) 50 MCG/ACT nasal spray Place 1 spray into both nostrils daily as needed for allergies.   . Fluticasone-Salmeterol (ADVAIR) 250-50 MCG/DOSE AEPB Inhale 1 puff into the lungs 2 (two) times daily as needed (asthma).   . hydrALAZINE (APRESOLINE) 100 MG tablet Take 100 mg by mouth 2 (two) times daily.  Marland Kitchen levothyroxine (SYNTHROID) 137 MCG tablet Take 1 tablet (137 mcg total) by mouth daily at 6 (six) AM.  . loperamide (IMODIUM) 2 MG capsule Take 2 mg by mouth as needed for diarrhea or loose stools.  Marland Kitchen losartan (COZAAR) 100 MG tablet Take 100 mg by mouth daily.  . metFORMIN (GLUCOPHAGE-XR) 500 MG 24 hr tablet Take 500 mg by mouth at bedtime.  . metoprolol tartrate (LOPRESSOR) 50 MG tablet Take 50 mg by mouth 2 (two) times daily.   . montelukast (SINGULAIR) 10 MG tablet Take 10 mg by mouth at bedtime.  . Multiple Minerals-Vitamins (CALCIUM &  VIT D3 BONE HEALTH PO) Take 1 tablet by mouth daily. 600 mg/ 25 mg  . Naphazoline-Pheniramine (OPCON-A) 0.027-0.315 % SOLN Place 1 drop into both eyes 2 (two) times daily as needed (dry eyes).  . potassium chloride SA (K-DUR,KLOR-CON) 20 MEQ tablet Take 20 mEq by mouth 2 (two) times daily.  . rosuvastatin (CRESTOR) 40 MG tablet Take 40 mg by mouth  daily.  . sertraline (ZOLOFT) 100 MG tablet Take 100 mg by mouth daily.  Marland Kitchen torsemide (DEMADEX) 10 MG tablet Take 10-20 mg by mouth daily.   . traMADol (ULTRAM) 50 MG tablet Take 1 tablet (50 mg total) by mouth every 6 (six) hours as needed (mild pain).    Today's Vitals   09/09/19 1332  BP: (!) 175/62  Pulse: 69  Resp: 14  Temp: (!) 97.3 F (36.3 C)  TempSrc: Temporal  SpO2: 94%  Weight: 208 lb 3.2 oz (94.4 kg)  Height: 5' 2"  (1.575 m)  PainSc: 0-No pain   Body mass index is 38.08 kg/m. Focused examination of the neck: The transverse thyroidectomy scar is well approximated.  There is no erythema, induration, or drainage present.  There is a small amount of expected postoperative swelling present.  There is a healing ridge beneath the surface.  Impression and plan: This is a 78 year old woman who had compressive symptoms secondary to multinodular goiter.  She underwent a uneventful total thyroidectomy and is doing well.  She should continue to take her thyroid hormone medication first thing in the morning on an empty stomach.  I clarified with her that this does not need to be 6 AM, only that she needs to wait an hour after taking it before taking additional medications.  She should follow-up with her primary care provider or Dr. Elisabeth Cara to have her thyroid levels checked in about 6-8 weeks.  She may decrease her calcium to 2 tablets once daily for bone health.  She was instructed in scar care, including massage with an emollient agent to reduce tethering and improve the cosmetic outcome.  She should also apply sunscreen to prevent altered pigmentation of the area.  At this time, she does not have any ongoing endocrine surgical issues and I will see her on an as-needed basis.

## 2019-09-09 NOTE — Patient Instructions (Addendum)
Please call if you have questions or concerns. Please take 2 Tablets of Calcium once daily.   You may apply cocoa butter cream, or Vitamin E oil to the area to help reduce the scarring.     You will need to have your Thyroid levels checked  by Dr.Solum or your primary care physician in 2 months. February 2021.

## 2019-09-18 ENCOUNTER — Ambulatory Visit: Payer: Medicare Other | Admitting: Urology

## 2019-10-02 ENCOUNTER — Encounter: Payer: Self-pay | Admitting: *Deleted

## 2019-11-06 ENCOUNTER — Ambulatory Visit: Payer: Medicare Other | Admitting: Urology

## 2020-05-24 ENCOUNTER — Ambulatory Visit: Payer: Medicare Other | Admitting: Dermatology

## 2020-07-28 ENCOUNTER — Other Ambulatory Visit: Payer: Self-pay

## 2020-07-28 ENCOUNTER — Ambulatory Visit (INDEPENDENT_AMBULATORY_CARE_PROVIDER_SITE_OTHER): Payer: Medicare Other | Admitting: Dermatology

## 2020-07-28 DIAGNOSIS — L720 Epidermal cyst: Secondary | ICD-10-CM | POA: Diagnosis not present

## 2020-07-28 DIAGNOSIS — L578 Other skin changes due to chronic exposure to nonionizing radiation: Secondary | ICD-10-CM | POA: Diagnosis not present

## 2020-07-28 DIAGNOSIS — L570 Actinic keratosis: Secondary | ICD-10-CM | POA: Diagnosis not present

## 2020-07-28 DIAGNOSIS — L820 Inflamed seborrheic keratosis: Secondary | ICD-10-CM | POA: Diagnosis not present

## 2020-07-28 NOTE — Patient Instructions (Signed)

## 2020-07-28 NOTE — Progress Notes (Signed)
   Follow-Up Visit   Subjective  [name] is a 79 y.o. female who presents for the following: Other (SPots of left brow and right wrist that are scaly). Some other spots too.  The following portions of the chart were reviewed this encounter and updated as appropriate:  Tobacco  Allergies  Meds  Problems  Med Hx  Surg Hx  Fam Hx     Review of Systems:  No other skin or systemic complaints except as noted in HPI or Assessment and Plan.  Objective  Well appearing patient in no apparent distress; mood and affect are within normal limits.  A focused examination was performed including face, arms, hands. Relevant physical exam findings are noted in the Assessment and Plan.  Objective  Left medial brow: 0.5 cm cystic papule  Objective  Right Wrist x 1, left elbow x 1 (2): Erythematous keratotic or waxy stuck-on papule or plaque.   Objective  Left cheek: Erythematous thin papules/macules with gritty scale.    Assessment & Plan  Epidermal inclusion cyst Left medial brow  Recommend excision. Patient will schedule surgery appointment.  Inflamed seborrheic keratosis (2) Right Wrist x 1, left elbow x 1  Destruction of lesion - Right Wrist x 1, left elbow x 1 Complexity: simple   Destruction method: cryotherapy   Informed consent: discussed and consent obtained   Timeout:  patient name, date of birth, surgical site, and procedure verified Lesion destroyed using liquid nitrogen: Yes   Region frozen until ice ball extended beyond lesion: Yes   Outcome: patient tolerated procedure well with no complications   Post-procedure details: wound care instructions given    AK (actinic keratosis) Left cheek  Destruction of lesion - Left cheek Complexity: simple   Destruction method: cryotherapy   Informed consent: discussed and consent obtained   Timeout:  patient name, date of birth, surgical site, and procedure verified Lesion destroyed using liquid nitrogen:  Yes   Region frozen until ice ball extended beyond lesion: Yes   Outcome: patient tolerated procedure well with no complications   Post-procedure details: wound care instructions given    Actinic Damage - diffuse scaly erythematous macules with underlying dyspigmentation - Recommend daily broad spectrum sunscreen SPF 30+ to sun-exposed areas, reapply every 2 hours as needed.  - Call for new or changing lesions.  Return for Surgery cyst of left brow.   I, Ashok Cordia, CMA, am acting as scribe for Sarina Ser, MD .  Documentation: I have reviewed the above documentation for accuracy and completeness, and I agree with the above.  Sarina Ser, MD

## 2020-07-29 ENCOUNTER — Encounter: Payer: Self-pay | Admitting: Dermatology

## 2020-10-05 ENCOUNTER — Other Ambulatory Visit: Payer: Self-pay

## 2020-10-05 ENCOUNTER — Ambulatory Visit (INDEPENDENT_AMBULATORY_CARE_PROVIDER_SITE_OTHER): Payer: Medicare Other | Admitting: Dermatology

## 2020-10-05 ENCOUNTER — Encounter: Payer: Self-pay | Admitting: Dermatology

## 2020-10-05 DIAGNOSIS — D485 Neoplasm of uncertain behavior of skin: Secondary | ICD-10-CM

## 2020-10-05 DIAGNOSIS — L84 Corns and callosities: Secondary | ICD-10-CM

## 2020-10-05 DIAGNOSIS — D2339 Other benign neoplasm of skin of other parts of face: Secondary | ICD-10-CM

## 2020-10-05 MED ORDER — MUPIROCIN 2 % EX OINT: TOPICAL_OINTMENT | CUTANEOUS | 0 refills | Status: DC

## 2020-10-05 NOTE — Patient Instructions (Signed)

## 2020-10-05 NOTE — Progress Notes (Signed)
Follow-Up Visit   Subjective  [name]. [name] is a 80 y.o. female who presents for the following: Cyst (L med brow - patient is here today for excision.). Patient has a lesion on her left scalp that she would like checked today. She states that an x-ray was done in the past by her PCP which came back benign, but she doesn't recall what the doctor said it was.   The following portions of the chart were reviewed this encounter and updated as appropriate:   Tobacco  Allergies  Meds  Problems  Med Hx  Surg Hx  Fam Hx     Review of Systems:  No other skin or systemic complaints except as noted in HPI or Assessment and Plan.  Objective  Well appearing patient in no apparent distress; mood and affect are within normal limits.  A focused examination was performed including the face. Relevant physical exam findings are noted in the Assessment and Plan.  Objective  L med brow: SQ nodule 1.1 cm   Objective  L lat scalp: Firm SQ nodule    Assessment & Plan  Neoplasm of uncertain behavior of skin L med brow  Skin excision  Lesion length (cm):  1.1 Lesion width (cm):  1.1 Margin per side (cm):  0 Total excision diameter (cm):  1.1 Informed consent: discussed and consent obtained   Timeout: patient name, date of birth, surgical site, and procedure verified   Procedure prep:  Patient was prepped and draped in usual sterile fashion Prep type:  Isopropyl alcohol and povidone-iodine Anesthesia: the lesion was anesthetized in a standard fashion   Anesthesia comment:  3.0 cc Anesthetic:  1% lidocaine w/ epinephrine 1-100,000 buffered w/ 8.4% NaHCO3 Instrument used: #15 blade   Hemostasis achieved with: pressure   Hemostasis achieved with comment:  Electrocautery Outcome: patient tolerated procedure well with no complications   Post-procedure details: sterile dressing applied and wound care instructions given   Dressing type: bandage and pressure dressing    Skin  repair Complexity:  Complex Final length (cm):  1.1 Informed consent: discussed and consent obtained   Timeout: patient name, date of birth, surgical site, and procedure verified   Procedure prep:  Patient was prepped and draped in usual sterile fashion Prep type:  Povidone-iodine Anesthesia: the lesion was anesthetized in a standard fashion   Reason for type of repair: reduce tension to allow closure, reduce the risk of dehiscence, infection, and necrosis, reduce subcutaneous dead space and avoid a hematoma, allow closure of the large defect, preserve normal anatomy, preserve normal anatomical and functional relationships and enhance both functionality and cosmetic results   Undermining: area extensively undermined   Undermining comment:  Undermining 1.1 cm Subcutaneous layers (deep stitches):  Suture size:  6-0 Suture type: Vicryl (polyglactin 910)   Fine/surface layer approximation (top stitches):  Suture size:  6-0 Suture type: nylon   Stitches: simple interrupted   Suture removal (days):  7 Hemostasis achieved with: suture and pressure Outcome: patient tolerated procedure well with no complications   Post-procedure details: sterile dressing applied and wound care instructions given   Dressing type: bandage and pressure dressing    mupirocin ointment (BACTROBAN) 2 %  Specimen 1 - Surgical pathology Differential Diagnosis: D48.5 r/o hidrocystoma vs other  Check Margins: No 1.1 cm SQ nodule  Bony Callus L lat scalp Bony callus - benign appearing, observe Pt should have evaluated by PCP. Chart reviewed and CT from 2016 does not show this spot,  but she had 2 CTs of head at that time 2 weeks apart for 2 different falls and head trauma.  Return in about 1 week (around 10/12/2020) for suture removal.  I, Rudell Cobb, CMA, am acting as scribe for Sarina Ser, MD .  Documentation: I have reviewed the above documentation for accuracy and completeness, and I agree with the  above.  Sarina Ser, MD

## 2020-10-07 ENCOUNTER — Telehealth: Payer: Self-pay

## 2020-10-07 NOTE — Telephone Encounter (Signed)
Patient doing well S/P excision with no complications. Advised patient to contact us if she experiences any issues.

## 2020-10-12 ENCOUNTER — Other Ambulatory Visit: Payer: Self-pay

## 2020-10-12 ENCOUNTER — Ambulatory Visit: Payer: Medicare Other

## 2020-10-12 DIAGNOSIS — D239 Other benign neoplasm of skin, unspecified: Secondary | ICD-10-CM

## 2020-10-12 NOTE — Progress Notes (Signed)
   Follow-Up Visit   Subjective  [name]. Laden E Moga is a 80 y.o. female who presents for the following: Suture / Staple Removal (1 week f/u ).   The following portions of the chart were reviewed this encounter and updated as appropriate:       Review of Systems:  No other skin or systemic complaints except as noted in HPI or Assessment and Plan.  Objective  Well appearing patient in no apparent distress; mood and affect are within normal limits.  A focused examination was performed including face. Relevant physical exam findings are noted in the Assessment and Plan.  Objective  Left medial brow: Healing excision site.   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  - Discussed pathology results showing Hidrocystoma - Patient advised to keep steri-strips dry until they fall off. - Scars remodel for a full year. - Once steri-strips fall off, patient can apply over-the-counter silicone scar cream each night to help with scar remodeling if desired. - Patient advised to call with any concerns or if they notice any new or changing lesions.   Return in about 1 year (around 10/12/2021) for TBSE .  IJamesetta Orleans, CMA, am acting as scribe for [employer] .

## 2021-01-25 ENCOUNTER — Other Ambulatory Visit: Payer: Self-pay | Admitting: Nephrology

## 2021-01-25 DIAGNOSIS — E1122 Type 2 diabetes mellitus with diabetic chronic kidney disease: Secondary | ICD-10-CM

## 2021-02-16 ENCOUNTER — Ambulatory Visit
Admission: RE | Admit: 2021-02-16 | Discharge: 2021-02-16 | Disposition: A | Discharge status: Home / Self Care | Payer: Medicare Other | Source: Ambulatory Visit | Attending: Nephrology | Admitting: Nephrology

## 2021-02-16 ENCOUNTER — Other Ambulatory Visit: Payer: Self-pay

## 2021-02-16 DIAGNOSIS — E1122 Type 2 diabetes mellitus with diabetic chronic kidney disease: Secondary | ICD-10-CM | POA: Insufficient documentation

## 2021-02-16 DIAGNOSIS — N184 Chronic kidney disease, stage 4 (severe): Secondary | ICD-10-CM | POA: Insufficient documentation

## 2021-07-07 DIAGNOSIS — M1A00X0 Idiopathic chronic gout, unspecified site, without tophus (tophi): Secondary | ICD-10-CM | POA: Insufficient documentation

## 2021-07-26 ENCOUNTER — Encounter: Payer: Self-pay | Admitting: General Surgery

## 2021-10-02 DIAGNOSIS — J449 Chronic obstructive pulmonary disease, unspecified: Secondary | ICD-10-CM | POA: Diagnosis present

## 2021-10-13 ENCOUNTER — Ambulatory Visit (INDEPENDENT_AMBULATORY_CARE_PROVIDER_SITE_OTHER): Payer: Medicare Other | Admitting: Dermatology

## 2021-10-13 ENCOUNTER — Other Ambulatory Visit: Payer: Self-pay

## 2021-10-13 DIAGNOSIS — Z85828 Personal history of other malignant neoplasm of skin: Secondary | ICD-10-CM

## 2021-10-13 DIAGNOSIS — L821 Other seborrheic keratosis: Secondary | ICD-10-CM

## 2021-10-13 DIAGNOSIS — D229 Melanocytic nevi, unspecified: Secondary | ICD-10-CM | POA: Diagnosis not present

## 2021-10-13 DIAGNOSIS — L820 Inflamed seborrheic keratosis: Secondary | ICD-10-CM

## 2021-10-13 DIAGNOSIS — D1800 Hemangioma unspecified site: Secondary | ICD-10-CM

## 2021-10-13 DIAGNOSIS — L570 Actinic keratosis: Secondary | ICD-10-CM | POA: Diagnosis not present

## 2021-10-13 DIAGNOSIS — L814 Other melanin hyperpigmentation: Secondary | ICD-10-CM

## 2021-10-13 DIAGNOSIS — L578 Other skin changes due to chronic exposure to nonionizing radiation: Secondary | ICD-10-CM

## 2021-10-13 DIAGNOSIS — Z1283 Encounter for screening for malignant neoplasm of skin: Secondary | ICD-10-CM | POA: Diagnosis not present

## 2021-10-13 NOTE — Patient Instructions (Signed)
Cryotherapy Aftercare ° °Wash gently with soap and water everyday.   °Apply Vaseline and Band-Aid daily until healed.  ° ° °If You Need Anything After Your Visit ° °If you have any questions or concerns for your doctor, please call our main line at [phone] and press option 4 to reach your doctor's medical assistant. If no one answers, please leave a voicemail as directed and we will return your call as soon as possible. Messages left after 4 pm will be answered the following business day.  ° °You may also send us a message via MyChart. We typically respond to MyChart messages within 1-2 business days. ° °For prescription refills, please ask your pharmacy to contact our office. Our fax number is [phone]. ° °If you have an urgent issue when the clinic is closed that cannot wait until the next business day, you can page your doctor at the number below.   ° °Please note that while we do our best to be available for urgent issues outside of office hours, we are not available 24/7.  ° °If you have an urgent issue and are unable to reach us, you may choose to seek medical care at your doctor's office, retail clinic, urgent care center, or emergency room. ° °If you have a medical emergency, please immediately call 911 or go to the emergency department. ° °Pager Numbers ° °- Dr. Kowalski: [phone] ° °- Dr. Moye: [phone] ° °- Dr. Stewart: [phone] ° °In the event of inclement weather, please call our main line at [phone] for an update on the status of any delays or closures. ° °Dermatology Medication Tips: °Please keep the boxes that topical medications come in in order to help keep track of the instructions about where and how to use these. Pharmacies typically print the medication instructions only on the boxes and not directly on the medication tubes.  ° °If your medication is too expensive, please contact our office at [phone] option 4 or send us a message through MyChart.  ° °We are  unable to tell what your co-pay for medications will be in advance as this is different depending on your insurance coverage. However, we may be able to find a substitute medication at lower cost or fill out paperwork to get insurance to cover a needed medication.  ° °If a prior authorization is required to get your medication covered by your insurance company, please allow us 1-2 business days to complete this process. ° °Drug prices often vary depending on where the prescription is filled and some pharmacies may offer cheaper prices. ° °The website www.goodrx.com contains coupons for medications through different pharmacies. The prices here do not account for what the cost may be with help from insurance (it may be cheaper with your insurance), but the website can give you the price if you did not use any insurance.  °- You can print the associated coupon and take it with your prescription to the pharmacy.  °- You may also stop by our office during regular business hours and pick up a GoodRx coupon card.  °- If you need your prescription sent electronically to a different pharmacy, notify our office through [place] MyChart or by phone at [phone] option 4. ° ° ° ° °Si Usted Necesita Algo Después de Su Visita ° °También puede enviarnos un mensaje a través de MyChart. Por lo general respondemos a los mensajes de MyChart en el transcurso de 1 a 2 días hábiles. ° °Para renovar recetas, por favor   pida a su farmacia que se ponga en contacto con nuestra oficina. Nuestro número de fax es el [phone]. ° °Si tiene un asunto urgente cuando la clínica esté cerrada y que no puede esperar hasta el siguiente día hábil, puede llamar/localizar a su doctor(a) al número que aparece a continuación.  ° °Por favor, tenga en cuenta que aunque hacemos todo lo posible para estar disponibles para asuntos urgentes fuera del horario de oficina, no estamos disponibles las 24 horas del día, los 7 días de la semana.  ° °Si tiene un  problema urgente y no puede comunicarse con nosotros, puede optar por buscar atención médica  en el consultorio de su doctor(a), en una clínica privada, en un centro de atención urgente o en una sala de emergencias. ° °Si tiene una emergencia médica, por favor llame inmediatamente al 911 o vaya a la sala de emergencias. ° °Números de bíper ° °- Dr. Kowalski: [phone] ° °- Dra. Moye: [phone] ° °- Dra. Stewart: [phone] ° °En caso de inclemencias del tiempo, por favor llame a nuestra línea principal al [phone] para una actualización sobre el estado de cualquier retraso o cierre. ° °Consejos para la medicación en dermatología: °Por favor, guarde las cajas en las que vienen los medicamentos de uso tópico para ayudarle a seguir las instrucciones sobre dónde y cómo usarlos. Las farmacias generalmente imprimen las instrucciones del medicamento sólo en las cajas y no directamente en los tubos del medicamento.  ° °Si su medicamento es muy caro, por favor, póngase en contacto con nuestra oficina llamando al [phone] y presione la opción 4 o envíenos un mensaje a través de MyChart.  ° °No podemos decirle cuál será su copago por los medicamentos por adelantado ya que esto es diferente dependiendo de la cobertura de su seguro. Sin embargo, es posible que podamos encontrar un medicamento sustituto a menor costo o llenar un formulario para que el seguro cubra el medicamento que se considera necesario.  ° °Si se requiere una autorización previa para que su compañía de seguros cubra su medicamento, por favor permítanos de 1 a 2 días hábiles para completar este proceso. ° °Los precios de los medicamentos varían con frecuencia dependiendo del lugar de dónde se surte la receta y alguna farmacias pueden ofrecer precios más baratos. ° °El sitio web www.goodrx.com tiene cupones para medicamentos de diferentes farmacias. Los precios aquí no tienen en cuenta lo que podría costar con la ayuda del seguro (puede ser más  barato con su seguro), pero el sitio web puede darle el precio si no utilizó ningún seguro.  °- Puede imprimir el cupón correspondiente y llevarlo con su receta a la farmacia.  °- También puede pasar por nuestra oficina durante el horario de atención regular y recoger una tarjeta de cupones de GoodRx.  °- Si necesita que su receta se envíe electrónicamente a una farmacia diferente, informe a nuestra oficina a través de MyChart de [place] o por teléfono llamando al [phone] y presione la opción 4.  °

## 2021-10-13 NOTE — Progress Notes (Signed)
Follow-Up Visit   Subjective  [name]. [name] is a 81 y.o. female who presents for the following: Follow-up (Total body exam today. Hx of BCC. Some spots to check today. ). The patient presents for Total-Body Skin Exam (TBSE) for skin cancer screening and mole check.  The patient has spots, moles and lesions to be evaluated, some may be new or changing and the patient has concerns that these could be cancer.  The following portions of the chart were reviewed this encounter and updated as appropriate:  Tobacco   Allergies   Meds   Problems   Med Hx   Surg Hx   Fam Hx      Review of Systems: No other skin or systemic complaints except as noted in HPI or Assessment and Plan.  Objective  Well appearing patient in no apparent distress; mood and affect are within normal limits.  A full examination was performed including scalp, head, eyes, ears, nose, lips, neck, chest, axillae, abdomen, back, buttocks, bilateral upper extremities, bilateral lower extremities, hands, feet, fingers, toes, fingernails, and toenails. All findings within normal limits unless otherwise noted below.  face x 8 (8) Erythematous thin papules/macules with gritty scale.   left cheek x 2 (2) Erythematous keratotic or waxy stuck-on papule or plaque.    Assessment & Plan  AK (actinic keratosis) (8) face x 8  Actinic keratoses are precancerous spots that appear secondary to cumulative UV radiation exposure/sun exposure over time. They are chronic with expected duration over 1 year. A portion of actinic keratoses will progress to squamous cell carcinoma of the skin. It is not possible to reliably predict which spots will progress to skin cancer and so treatment is recommended to prevent development of skin cancer.  Recommend daily broad spectrum sunscreen SPF 30+ to sun-exposed areas, reapply every 2 hours as needed.  Recommend staying in the shade or wearing long sleeves, sun glasses (UVA+UVB protection) and wide  brim hats (4-inch brim around the entire circumference of the hat). Call for new or changing lesions.  Prior to procedure, discussed risks of blister formation, small wound, skin dyspigmentation, or rare scar following cryotherapy. Recommend Vaseline ointment to treated areas while healing.   Destruction of lesion - face x 8 Complexity: simple   Destruction method: cryotherapy   Informed consent: discussed and consent obtained   Timeout:  patient name, date of birth, surgical site, and procedure verified Lesion destroyed using liquid nitrogen: Yes   Region frozen until ice ball extended beyond lesion: Yes   Outcome: patient tolerated procedure well with no complications   Post-procedure details: wound care instructions given    Inflamed seborrheic keratosis (2) left cheek x 2  Prior to procedure, discussed risks of blister formation, small wound, skin dyspigmentation, or rare scar following cryotherapy. Recommend Vaseline ointment to treated areas while healing.   Destruction of lesion - left cheek x 2 Complexity: simple   Destruction method: cryotherapy   Informed consent: discussed and consent obtained   Timeout:  patient name, date of birth, surgical site, and procedure verified Lesion destroyed using liquid nitrogen: Yes   Region frozen until ice ball extended beyond lesion: Yes   Outcome: patient tolerated procedure well with no complications   Post-procedure details: wound care instructions given    Lentigines - Scattered tan macules - Due to sun exposure - Benign-appearing, observe - Recommend daily broad spectrum sunscreen SPF 30+ to sun-exposed areas, reapply every 2 hours as needed. - Call for any  changes  Seborrheic Keratoses - Stuck-on, waxy, tan-brown papules and/or plaques  - Benign-appearing - Discussed benign etiology and prognosis. - Observe - Call for any changes  Melanocytic Nevi - Tan-brown and/or pink-flesh-colored symmetric macules and papules -  Benign appearing on exam today - Observation - Call clinic for new or changing moles - Recommend daily use of broad spectrum spf 30+ sunscreen to sun-exposed areas.   Hemangiomas - Red papules - Discussed benign nature - Observe - Call for any changes  Actinic Damage - Chronic condition, secondary to cumulative UV/sun exposure - diffuse scaly erythematous macules with underlying dyspigmentation - Recommend daily broad spectrum sunscreen SPF 30+ to sun-exposed areas, reapply every 2 hours as needed.  - Staying in the shade or wearing long sleeves, sun glasses (UVA+UVB protection) and wide brim hats (4-inch brim around the entire circumference of the hat) are also recommended for sun protection.  - Call for new or changing lesions.  Skin cancer screening performed today.  History of Basal Cell Carcinoma of the Skin - No evidence of recurrence today - Recommend regular full body skin exams - Recommend daily broad spectrum sunscreen SPF 30+ to sun-exposed areas, reapply every 2 hours as needed.  - Call if any new or changing lesions are noted between office visits  Return in about 1 year (around 10/13/2022) for TSBE.  [name], CMA, am acting as scribe for Sarina Ser, MD. Documentation: I have reviewed the above documentation for accuracy and completeness, and I agree with the above.  Sarina Ser, MD

## 2021-10-17 ENCOUNTER — Encounter: Payer: Self-pay | Admitting: Dermatology

## 2022-02-16 ENCOUNTER — Ambulatory Visit (INDEPENDENT_AMBULATORY_CARE_PROVIDER_SITE_OTHER): Payer: Medicare Other | Admitting: Nurse Practitioner

## 2022-02-16 ENCOUNTER — Encounter (INDEPENDENT_AMBULATORY_CARE_PROVIDER_SITE_OTHER): Payer: Self-pay | Admitting: Vascular Surgery

## 2022-02-16 VITALS — BP 159/69 | HR 57 | Resp 17 | Ht 62.0 in

## 2022-02-16 DIAGNOSIS — E1169 Type 2 diabetes mellitus with other specified complication: Secondary | ICD-10-CM

## 2022-02-16 DIAGNOSIS — E669 Obesity, unspecified: Secondary | ICD-10-CM

## 2022-02-16 DIAGNOSIS — N184 Chronic kidney disease, stage 4 (severe): Secondary | ICD-10-CM | POA: Diagnosis not present

## 2022-02-26 ENCOUNTER — Encounter (INDEPENDENT_AMBULATORY_CARE_PROVIDER_SITE_OTHER): Payer: Self-pay | Admitting: Nurse Practitioner

## 2022-02-26 NOTE — Progress Notes (Signed)
Subjective:    Patient ID: [name], female    DOB: December 26, 1940, 81 y.o.   MRN: [id number] Chief Complaint  Patient presents with   Establish Care    Referred by Dr Inocente Salles    The patient is seen for evaluation for dialysis access. The patient has chronic renal insufficiency stage V. The patient's most recent creatinine clearance is less than 20, however it vacillates.  The patient volume status has not yet become an issue. Patient's blood pressures been relatively well controlled. There are mild uremic symptoms which appear to be relatively well tolerated at this time.  The patient notes the kidney problem has been present for a long time and has been progressively getting worse.  The patient is followed by nephrology.    The patient is right-handed.  The patient has been considering the various methods of dialysis and wishes to proceed with hemodialysis and therefore creation of AV access.  The patient also has a family history of dialysis.      Review of Systems  All other systems reviewed and are negative.     Objective:   Physical Exam Vitals reviewed.  HENT:     Head: Normocephalic.  Cardiovascular:     Rate and Rhythm: Normal rate.     Pulses: Normal pulses.  Pulmonary:     Effort: Pulmonary effort is normal.  Skin:    General: Skin is warm and dry.  Neurological:     Mental Status: She is alert and oriented to person, place, and time.  Psychiatric:        Mood and Affect: Mood normal.        Behavior: Behavior normal.        Thought Content: Thought content normal.        Judgment: Judgment normal.    BP (!) 159/69 (BP Location: Right Arm)   Pulse (!) 57   Resp 17   Ht 5' 2"  (1.575 m)   BMI 38.08 kg/m   Past Medical History:  Diagnosis Date   Actinic keratosis    Albuminuria    Arthritis    Asthma    Basal cell carcinoma 04/12/2017   Above right lateral brow. Nodulocystic type. EDC   Cancer ([place])    skin   Cataract cortical, senile     Diabetes mellitus without complication (HCC)    GERD (gastroesophageal reflux disease)    Hemorrhoids    History of kidney stones    Hyperlipidemia    Hypertension    Lyme disease    No kidney function    OSA (obstructive sleep apnea)    Osteoporosis    Osteoporosis    Reflux esophagitis    Steatohepatitis    Steatohepatitis     Social History   Socioeconomic History   Marital status: Married    Spouse name: Not on file   Number of children: Not on file   Years of education: Not on file   Highest education level: Not on file  Occupational History   Not on file  Tobacco Use   Smoking status: Never   Smokeless tobacco: Never  Vaping Use   Vaping Use: Never used  Substance and Sexual Activity   Alcohol use: No   Drug use: No   Sexual activity: Not on file  Other Topics Concern   Not on file  Social History Narrative   Not on file   Social Determinants of Health   Financial Resource Strain: Not on file  Food Insecurity: Not on file  Transportation Needs: Not on file  Physical Activity: Not on file  Stress: Not on file  Social Connections: Not on file  Intimate Partner Violence: Not on file    Past Surgical History:  Procedure Laterality Date   [place]  08/13/2014   [facility]. no significant CAD, normal LVEDP.    CATARACT EXTRACTION     CHOLECYSTECTOMY     COLONOSCOPY     COLONOSCOPY WITH PROPOFOL N/A 12/07/2016   Procedure: COLONOSCOPY WITH PROPOFOL;  Surgeon: Lollie Sails, MD;  Location[facility] END[facility]COPY;  Service: Endoscopy;  Laterality: N/A;   ESOPHAGOGASTRODUODENOSCOPY     ESOPHAGOGASTRODUODENOSCOPY (EGD) WITH PROPOFOL N/A 12/07/2016   Procedure: ESOPHAGOGASTRODUODENOSCOPY (EGD) WITH PROPOFOL;  Surgeon: Lollie Sails, MD;  Location[facility] ENDOSCOPY;  Service: Endoscopy;  Laterality: N/A;   ESOPHAGOGASTRODUODENOSCOPY (EGD) WITH PROPOFOL N/A 01/07/2018    Procedure: ESOPHAGOGASTRODUODENOSCOPY (EGD) WITH PROPOFOL;  Surgeon: Lollie Sails, MD;  [facility] END[facility]COPY;  Service: Endoscopy;  Laterality: N/A;   EYE SURGERY     HEMORRHOID SURGERY     PARTIAL HYSTERECTOMY     THYROIDECTOMY N/A 08/25/2019   Procedure: THYROIDECTOMY EXTRACTION OF SUBTOTAL COMPONENT; PARATHYROID AUTOTRANSPLANT X1;  Surgeon: Fredirick Maudlin, MD;  Location: [facility] ORS;  Service: General;  Laterality: N/A;  With Nerve Monitoring(RLN)   TONSILLECTOMY      Family History  Problem Relation Age of Onset   Breast cancer Mother    Heart attack Father     Allergies  Allergen Reactions   Ace Inhibitors     Other reaction(s): Unknown   Eggs Or Egg-Derived Products Diarrhea   Other     Other reaction(s): Other (See Comments) Eggs   Prednisone     Other reaction(s): Other (See Comments) joint pain   Risedronate     Other reaction(s): Other (See Comments)   Sulfa Antibiotics     Other reaction(s): Other (See Comments)   Sulfasalazine Other (See Comments)       Latest Ref Rng & Units 08/19/2019   11:01 AM 12/30/2016    2:55 AM 12/29/2016    7:47 AM  CBC  WBC 4.0 - 10.5 K/uL 7.8   8.2   11.8    Hemoglobin 12.0 - 15.0 g/dL 12.2   12.6   13.8    Hematocrit 36.0 - 46.0 % 36.2   36.7   40.6    Platelets 150 - 400 K/uL 145   141   185        CMP     Component Value Date/Time   NA 141 12/31/2016 0318   NA 141 08/11/2014 0850   NA 142 06/20/2012 1353   K 3.4 (L) 12/31/2016 0318   K 3.2 (L) 06/20/2012 1353   CL 113 (H) 12/31/2016 0318   CL 103 06/20/2012 1353   CO2 22 12/31/2016 0318   CO2 29 06/20/2012 1353   GLUCOSE 129 (H) 12/31/2016 0318   GLUCOSE 84 06/20/2012 1353   BUN 11 12/31/2016 0318   BUN 16 08/11/2014 0850   BUN 13 06/20/2012 1353   CREATININE 0.64 12/31/2016 0318   CREATININE 0.65 06/20/2012 1353   CALCIUM 8.5 (L) 08/26/2019 0433   CALCIUM 9.4 06/20/2012 1353   PROT 6.9 12/29/2016 0747   PROT 7.4 06/20/2012 1353   ALBUMIN 3.4 (L)  08/26/2019 0433   ALBUMIN 3.8 06/20/2012 1353  AST 30 12/29/2016 0747   AST 32 06/20/2012 1353   ALT 26 12/29/2016 0747   ALT 44 06/20/2012 1353   ALKPHOS 55 12/29/2016 0747   ALKPHOS 106 06/20/2012 1353   BILITOT 0.8 12/29/2016 0747   BILITOT 0.6 06/20/2012 1353   GFRNONAA >60 12/31/2016 0318   GFRNONAA >60 06/20/2012 1353   GFRAA >60 12/31/2016 0318   GFRAA >60 06/20/2012 1353     No results found.     Assessment & Plan:   1. Chronic kidney disease (CKD), stage IV (severe) (HCC) Recommend:  The patient currently is not yet on dialysis, however we are in the preplanning stages.  Patient should have vein mapping of her arms to plan for upper extremity access creation.  The goal is to create an upper extremity access and so improve the quality of dialysis therapy.  The risks, benefits and alternative therapies with respect to to the different kinds of dialysis accesee were reviewed in detail with the patient.  All questions were answered.  The patient agrees to proceed with mapping.    The patient will follow up in the office with the duplex ultrasound.   2. Diabetes mellitus type 2 in obese [facility]) Continue hypoglycemic medications as already ordered, these medications have been reviewed and there are no changes at this time.  Hgb A1C to be monitored as already arranged by primary service     Current Outpatient Medications on File Prior to Visit  Medication Sig Dispense Refill   acetaminophen (TYLENOL) 500 MG tablet Take 1,000 mg by mouth every 6 (six) hours as needed (pain).     albuterol (PROVENTIL HFA;VENTOLIN HFA) 108 (90 BASE) MCG/ACT inhaler Inhale 1-2 puffs into the lungs every 4 (four) hours as needed for wheezing or shortness of breath.     amLODipine (NORVASC) 10 MG tablet Take 10 mg by mouth daily.     azelastine (ASTELIN) 0.1 % nasal spray ONE SPRAY INTO BOTH NOSTRILS TWICE DAILY     diclofenac Sodium (VOLTAREN) 1 % GEL APPLY 2 GRAMS TOPICALLY FOUR TIMES DAILY      esomeprazole (NEXIUM) 40 MG packet Take 40 mg by mouth daily before breakfast.     Fluticasone-Salmeterol (ADVAIR) 250-50 MCG/DOSE AEPB Inhale 1 puff into the lungs 2 (two) times daily as needed (asthma).      hydrALAZINE (APRESOLINE) 100 MG tablet Take 100 mg by mouth 2 (two) times daily.     levothyroxine (SYNTHROID) 137 MCG tablet Take 1 tablet (137 mcg total) by mouth daily at 6 (six) AM. 30 tablet 1   loratadine (CLARITIN) 10 MG tablet Take 10 mg by mouth daily.     losartan (COZAAR) 100 MG tablet Take 100 mg by mouth daily.     metoprolol tartrate (LOPRESSOR) 50 MG tablet Take 50 mg by mouth 2 (two) times daily.      montelukast (SINGULAIR) 10 MG tablet Take 10 mg by mouth at bedtime.     Multiple Minerals-Vitamins (CALCIUM & VIT D3 BONE HEALTH PO) Take 1 tablet by mouth daily. 600 mg/ 25 mg     potassium chloride SA (K-DUR,KLOR-CON) 20 MEQ tablet Take 20 mEq by mouth 2 (two) times daily.     rosuvastatin (CRESTOR) 40 MG tablet Take 40 mg by mouth daily.     sertraline (ZOLOFT) 100 MG tablet Take 100 mg by mouth daily.     spironolactone (ALDACTONE) 25 MG tablet Take 1 tablet by mouth daily.     torsemide (DEMADEX) 10 MG tablet  Take 10-20 mg by mouth daily.      calcium-vitamin D (OSCAL 500/200 D-3) 500-200 MG-UNIT tablet Take 1 tablet by mouth 3 (three) times daily. (Patient not taking: Reported on 02/16/2022) 90 tablet 0   fluticasone (FLONASE) 50 MCG/ACT nasal spray Place 1 spray into both nostrils daily as needed for allergies.  (Patient not taking: Reported on 02/16/2022)     loperamide (IMODIUM) 2 MG capsule Take 2 mg by mouth as needed for diarrhea or loose stools. (Patient not taking: Reported on 02/16/2022)     No current facility-administered medications on file prior to visit.    There are no Patient Instructions on file for this visit. No follow-ups on file.   Kris Hartmann, NP

## 2022-04-11 ENCOUNTER — Other Ambulatory Visit (INDEPENDENT_AMBULATORY_CARE_PROVIDER_SITE_OTHER): Payer: Self-pay | Admitting: Nurse Practitioner

## 2022-04-11 DIAGNOSIS — N184 Chronic kidney disease, stage 4 (severe): Secondary | ICD-10-CM

## 2022-04-12 ENCOUNTER — Encounter (INDEPENDENT_AMBULATORY_CARE_PROVIDER_SITE_OTHER): Payer: Self-pay | Admitting: Nurse Practitioner

## 2022-04-12 ENCOUNTER — Ambulatory Visit (INDEPENDENT_AMBULATORY_CARE_PROVIDER_SITE_OTHER): Payer: Medicare Other | Admitting: Nurse Practitioner

## 2022-04-12 ENCOUNTER — Ambulatory Visit (INDEPENDENT_AMBULATORY_CARE_PROVIDER_SITE_OTHER): Payer: Medicare Other

## 2022-04-12 VITALS — BP 172/67 | HR 62 | Resp 16 | Wt 191.0 lb

## 2022-04-12 DIAGNOSIS — N184 Chronic kidney disease, stage 4 (severe): Secondary | ICD-10-CM | POA: Diagnosis not present

## 2022-04-12 DIAGNOSIS — E1169 Type 2 diabetes mellitus with other specified complication: Secondary | ICD-10-CM | POA: Diagnosis not present

## 2022-04-12 DIAGNOSIS — E669 Obesity, unspecified: Secondary | ICD-10-CM

## 2022-04-25 ENCOUNTER — Telehealth (INDEPENDENT_AMBULATORY_CARE_PROVIDER_SITE_OTHER): Payer: Self-pay

## 2022-04-25 NOTE — Telephone Encounter (Signed)
Spoke with the patient on 04/24/22 to discuss having a left brachial cephalic fistula with Dr. Lucky Cowboy on 05/19/22 at [facility]. Pre-op phone call is on 05/11/22 between 1-5 pm. Pre-surgical instructions were discussed and will be mailed. Patient was offered 05/17/22 but declined.

## 2022-04-29 ENCOUNTER — Encounter (INDEPENDENT_AMBULATORY_CARE_PROVIDER_SITE_OTHER): Payer: Self-pay | Admitting: Nurse Practitioner

## 2022-04-29 NOTE — Progress Notes (Signed)
Subjective:    Patient ID: [name], female    DOB: 09-17-1941, 81 y.o.   MRN: [id number] Chief Complaint  Patient presents with   Follow-up    Ultrasound follow up    The patient is seen for evaluation for dialysis access. The patient has chronic renal insufficiency stage V. The patient's most recent creatinine clearance is less than 20, however it vacillates.  The patient volume status has not yet become an issue. Patient's blood pressures been relatively well controlled. There are mild uremic symptoms which appear to be relatively well tolerated at this time.   The patient notes the kidney problem has been present for a long time and has been progressively getting worse.   The patient is followed by nephrology.     The patient is right-handed.   The patient has been considering the various methods of dialysis and wishes to proceed with hemodialysis and therefore creation of AV access.  The patient also has a family history of dialysis.  The patient should have a left brachiocephalic AV fistula created    Review of Systems  Cardiovascular:  Positive for leg swelling.  All other systems reviewed and are negative.      Objective:   Physical Exam Vitals reviewed.  HENT:     Head: Normocephalic.  Cardiovascular:     Rate and Rhythm: Normal rate.     Pulses:          Radial pulses are 2+ on the right side and 2+ on the left side.  Pulmonary:     Effort: Pulmonary effort is normal.  Skin:    General: Skin is warm and dry.  Neurological:     Mental Status: She is alert and oriented to person, place, and time.  Psychiatric:        Mood and Affect: Mood normal.        Behavior: Behavior normal.        Thought Content: Thought content normal.        Judgment: Judgment normal.     BP (!) 172/67 (BP Location: Left Arm)   Pulse 62   Resp 16   Wt 191 lb (86.6 kg)   BMI 34.93 kg/m   Past Medical History:  Diagnosis Date   Actinic keratosis    Albuminuria     Arthritis    Asthma    Basal cell carcinoma 04/12/2017   Above right lateral brow. Nodulocystic type. EDC   Cancer ([place])    skin   Cataract cortical, senile    Diabetes mellitus without complication (HCC)    GERD (gastroesophageal reflux disease)    Hemorrhoids    History of kidney stones    Hyperlipidemia    Hypertension    Lyme disease    No kidney function    OSA (obstructive sleep apnea)    Osteoporosis    Osteoporosis    Reflux esophagitis    Steatohepatitis    Steatohepatitis     Social History   Socioeconomic History   Marital status: Married    Spouse name: Not on file   Number of children: Not on file   Years of education: Not on file   Highest education level: Not on file  Occupational History   Not on file  Tobacco Use   Smoking status: Never   Smokeless tobacco: Never  Vaping Use   Vaping Use: Never used  Substance and Sexual Activity   Alcohol use: No   Drug use:  No   Sexual activity: Not on file  Other Topics Concern   Not on file  Social History Narrative   Not on file   Social Determinants of Health   Financial Resource Strain: Not on file  Food Insecurity: Not on file  Transportation Needs: Not on file  Physical Activity: Not on file  Stress: Not on file  Social Connections: Not on file  Intimate Partner Violence: Not on file    Past Surgical History:  Procedure Laterality Date   [place]  08/13/2014   [facility]. no significant CAD, normal LVEDP.    CATARACT EXTRACTION     CHOLECYSTECTOMY     COLONOSCOPY     COLONOSCOPY WITH PROPOFOL N/A 12/07/2016   Procedure: COLONOSCOPY WITH PROPOFOL;  Surgeon: Lollie Sails, MD;  Location[facility] ENDOSCOPY;  Service: Endoscopy;  Laterality: N/A;   ESOPHAGOGASTRODUODENOSCOPY     ESOPHAGOGASTRODUODENOSCOPY (EGD) WITH PROPOFOL N/A 12/07/2016   Procedure: ESOPHAGOGASTRODUODENOSCOPY (EGD) WITH PROPOFOL;   Surgeon: Lollie Sails, MD;  Location: [facility] ENDOSCOPY;  Service: Endoscopy;  Laterality: N/A;   ESOPHAGOGASTRODUODENOSCOPY (EGD) WITH PROPOFOL N/A 01/07/2018   Procedure: ESOPHAGOGASTRODUODENOSCOPY (EGD) WITH PROPOFOL;  Surgeon: Lollie Sails, MD;  Location: [facility] ENDOSCOPY;  Service: Endoscopy;  Laterality: N/A;   EYE SURGERY     HEMORRHOID SURGERY     PARTIAL HYSTERECTOMY     THYROIDECTOMY N/A 08/25/2019   Procedure: THYROIDECTOMY EXTRACTION OF SUBTOTAL COMPONENT; PARATHYROID AUTOTRANSPLANT X1;  Surgeon: Fredirick Maudlin, MD;  Location: [facility] ORS;  Service: General;  Laterality: N/A;  With Nerve Monitoring(RLN)   TONSILLECTOMY      Family History  Problem Relation Age of Onset   Breast cancer Mother    Heart attack Father     Allergies  Allergen Reactions   Ace Inhibitors     Other reaction(s): Unknown   Eggs Or Egg-Derived Products Diarrhea   Other     Other reaction(s): Other (See Comments) Eggs   Prednisone     Other reaction(s): Other (See Comments) joint pain   Risedronate     Other reaction(s): Other (See Comments)   Sulfa Antibiotics     Other reaction(s): Other (See Comments)   Sulfasalazine Other (See Comments)       Latest Ref Rng & Units 08/19/2019   11:01 AM 12/30/2016    2:55 AM 12/29/2016    7:47 AM  CBC  WBC 4.0 - 10.5 K/uL 7.8  8.2  11.8   Hemoglobin 12.0 - 15.0 g/dL 12.2  12.6  13.8   Hematocrit 36.0 - 46.0 % 36.2  36.7  40.6   Platelets 150 - 400 K/uL 145  141  185       CMP     Component Value Date/Time   NA 141 12/31/2016 0318   NA 141 08/11/2014 0850   NA 142 06/20/2012 1353   K 3.4 (L) 12/31/2016 0318   K 3.2 (L) 06/20/2012 1353   CL 113 (H) 12/31/2016 0318   CL 103 06/20/2012 1353   CO2 22 12/31/2016 0318   CO2 29 06/20/2012 1353   GLUCOSE 129 (H) 12/31/2016 0318   GLUCOSE 84 06/20/2012 1353   BUN 11 12/31/2016 0318   BUN 16 08/11/2014 0850   BUN 13 06/20/2012 1353   CREATININE 0.64 12/31/2016 0318   CREATININE 0.65  06/20/2012 1353   CALCIUM 8.5 (L) 08/26/2019 0433   CALCIUM  9.4 06/20/2012 1353   PROT 6.9 12/29/2016 0747   PROT 7.4 06/20/2012 1353   ALBUMIN 3.4 (L) 08/26/2019 0433   ALBUMIN 3.8 06/20/2012 1353   AST 30 12/29/2016 0747   AST 32 06/20/2012 1353   ALT 26 12/29/2016 0747   ALT 44 06/20/2012 1353   ALKPHOS 55 12/29/2016 0747   ALKPHOS 106 06/20/2012 1353   BILITOT 0.8 12/29/2016 0747   BILITOT 0.6 06/20/2012 1353   GFRNONAA >60 12/31/2016 0318   GFRNONAA >60 06/20/2012 1353   GFRAA >60 12/31/2016 0318   GFRAA >60 06/20/2012 1353     No results found.     Assessment & Plan:   1. Chronic kidney disease (CKD), stage IV (severe) (HCC) Recommend:  At this time the patient does not have appropriate extremity access for dialysis  Patient should have a left brachiocephalic AV fistula created.  The risks, benefits and alternative therapies were reviewed in detail with the patient.  All questions were answered.  The patient agrees to proceed with surgery.   The patient will follow up with me in the office after the surgery.   2. Diabetes mellitus type 2 in obese [facility]) Continue hypoglycemic medications as already ordered, these medications have been reviewed and there are no changes at this time.  Hgb A1C to be monitored as already arranged by primary service    Current Outpatient Medications on File Prior to Visit  Medication Sig Dispense Refill   acetaminophen (TYLENOL) 500 MG tablet Take 1,000 mg by mouth every 6 (six) hours as needed (pain).     albuterol (PROVENTIL HFA;VENTOLIN HFA) 108 (90 BASE) MCG/ACT inhaler Inhale 1-2 puffs into the lungs every 4 (four) hours as needed for wheezing or shortness of breath.     amLODipine (NORVASC) 10 MG tablet Take 10 mg by mouth daily.     azelastine (ASTELIN) 0.1 % nasal spray ONE SPRAY INTO BOTH NOSTRILS TWICE DAILY     diclofenac Sodium (VOLTAREN) 1 % GEL APPLY 2 GRAMS TOPICALLY FOUR TIMES DAILY     esomeprazole (NEXIUM) 40 MG  packet Take 40 mg by mouth daily before breakfast.     Fluticasone-Salmeterol (ADVAIR) 250-50 MCG/DOSE AEPB Inhale 1 puff into the lungs 2 (two) times daily as needed (asthma).      hydrALAZINE (APRESOLINE) 100 MG tablet Take 100 mg by mouth 2 (two) times daily.     levothyroxine (SYNTHROID) 137 MCG tablet Take 1 tablet (137 mcg total) by mouth daily at 6 (six) AM. 30 tablet 1   loratadine (CLARITIN) 10 MG tablet Take 10 mg by mouth daily.     losartan (COZAAR) 100 MG tablet Take 100 mg by mouth daily.     metoprolol tartrate (LOPRESSOR) 50 MG tablet Take 50 mg by mouth 2 (two) times daily.      montelukast (SINGULAIR) 10 MG tablet Take 10 mg by mouth at bedtime.     Multiple Minerals-Vitamins (CALCIUM & VIT D3 BONE HEALTH PO) Take 1 tablet by mouth daily. 600 mg/ 25 mg     potassium chloride SA (K-DUR,KLOR-CON) 20 MEQ tablet Take 20 mEq by mouth 2 (two) times daily.     rosuvastatin (CRESTOR) 40 MG tablet Take 40 mg by mouth daily.     sertraline (ZOLOFT) 100 MG tablet Take 100 mg by mouth daily.     spironolactone (ALDACTONE) 25 MG tablet Take 1 tablet by mouth daily.     torsemide (DEMADEX) 10 MG tablet Take 10-20 mg by mouth daily.  calcium-vitamin D (OSCAL 500/200 D-3) 500-200 MG-UNIT tablet Take 1 tablet by mouth 3 (three) times daily. (Patient not taking: Reported on 02/16/2022) 90 tablet 0   fluticasone (FLONASE) 50 MCG/ACT nasal spray Place 1 spray into both nostrils daily as needed for allergies.  (Patient not taking: Reported on 02/16/2022)     loperamide (IMODIUM) 2 MG capsule Take 2 mg by mouth as needed for diarrhea or loose stools. (Patient not taking: Reported on 02/16/2022)     No current facility-administered medications on file prior to visit.    There are no Patient Instructions on file for this visit. No follow-ups on file.   Kris Hartmann, NP

## 2022-05-11 ENCOUNTER — Other Ambulatory Visit (INDEPENDENT_AMBULATORY_CARE_PROVIDER_SITE_OTHER): Payer: Self-pay | Admitting: Nurse Practitioner

## 2022-05-11 ENCOUNTER — Other Ambulatory Visit: Payer: Self-pay

## 2022-05-11 ENCOUNTER — Encounter
Admission: RE | Admit: 2022-05-11 | Discharge: 2022-05-11 | Disposition: A | Discharge status: Home / Self Care | Payer: Medicare Other | Source: Ambulatory Visit | Attending: Vascular Surgery | Admitting: Vascular Surgery

## 2022-05-11 DIAGNOSIS — G4733 Obstructive sleep apnea (adult) (pediatric): Secondary | ICD-10-CM

## 2022-05-11 DIAGNOSIS — N184 Chronic kidney disease, stage 4 (severe): Secondary | ICD-10-CM

## 2022-05-11 HISTORY — DX: Anemia, unspecified: D64.9

## 2022-05-11 HISTORY — DX: Heart failure, unspecified: I50.9

## 2022-05-11 HISTORY — DX: Hypothyroidism, unspecified: E03.9

## 2022-05-11 NOTE — Patient Instructions (Addendum)
Your procedure is scheduled on: 05/19/2022  Report to the Registration Desk on the 1st floor of the [facility]. To find out your arrival time, please call [phone] between 1PM - 3PM on: 05/18/2022  If your arrival time is 6:00 am, do not arrive prior to that time as the [place] entrance doors do not open until 6:00 am.  REMEMBER: Instructions that are not followed completely may result in serious medical risk, up to and including death; or upon the discretion of your surgeon and anesthesiologist your surgery may need to be rescheduled.  Do not eat food after midnight the night before surgery.  No gum chewing, lozengers or hard candies.    TAKE THESE MEDICATIONS THE MORNING OF SURGERY WITH A SIP OF WATER: Amlodipine  Nexium Hydralazine Levothyroxine metoprolol tartrate  potassium chloride  Rosuvastatin 8. Sertraline   Use inhalers like Advair  on the day of surgery and bring Albuterol to the hospital. Use your  nasal spray as prescribed.   One week prior to surgery: Stop Anti-inflammatories (NSAIDS) such as Advil, Aleve, Ibuprofen, Motrin, Naproxen, Naprosyn and Aspirin based products such as Excedrin, Goodys Powder, BC Powder. Stop ANY OVER THE COUNTER supplements until after surgery like multivitamin You may however, continue to take Tylenol if needed for pain up until the day of surgery.  No Alcohol for 24 hours before or after surgery.  No Smoking including e-cigarettes for 24 hours prior to surgery.  No chewable tobacco products for at least 6 hours prior to surgery.  No nicotine patches on the day of surgery.  Do not use any "recreational" drugs for at least a week prior to your surgery.  Please be advised that the combination of cocaine and anesthesia may have negative outcomes, up to and including death. If you test positive for cocaine, your surgery will be cancelled.  On the morning of surgery brush your teeth with toothpaste and water, you may rinse your  mouth with mouthwash if you wish. Do not swallow any toothpaste or mouthwash.  Use CHG Soap as directed on instruction sheet.-provided for you  Do not wear jewelry, make-up, hairpins, clips or nail polish.  Do not wear lotions, powders, or perfumes.   Do not shave body from the neck down 48 hours prior to surgery just in case you cut yourself which could leave a site for infection.  Also, freshly shaved skin may become irritated if using the CHG soap.  Contact lenses, hearing aids and dentures may not be worn into surgery.  Do not bring valuables to the hospital. [facility] is not responsible for any missing/lost belongings or valuables.   Bring your C-PAP to the hospital with you in case you may have to spend the night.   Notify your doctor if there is any change in your medical condition (cold, fever, infection).  Wear comfortable clothing (specific to your surgery type) to the hospital.  After surgery, you can help prevent lung complications by doing breathing exercises.  Take deep breaths and cough every 1-2 hours. Your doctor may order a device called an Incentive Spirometer to help you take deep breaths.   If you are being admitted to the hospital overnight, leave your suitcase in the car. After surgery it may be brought to your room.  If you are being discharged the day of surgery, you will not be allowed to drive home. You will need a responsible adult (18 years or older) to drive you home and stay with you  that night.   If you are taking public transportation, you will need to have a responsible adult (18 years or older) with you. Please confirm with your physician that it is acceptable to use public transportation.   Please call the [facility]. at [phone] if you have any questions about these instructions.  Surgery Visitation Policy:  Patients undergoing a surgery or procedure may have two family members or support persons with them as long as  the person is not COVID-19 positive or experiencing its symptoms.   Inpatient Visitation:    Visiting hours are 7 a.m. to 8 p.m. Up to four visitors are allowed at one time in a patient room, including children. The visitors may rotate out with other people during the day. One designated support person (adult) may remain overnight.

## 2022-05-12 ENCOUNTER — Encounter
Admission: RE | Admit: 2022-05-12 | Discharge: 2022-05-12 | Disposition: A | Discharge status: Home / Self Care | Payer: Medicare Other | Source: Ambulatory Visit | Attending: Vascular Surgery | Admitting: Vascular Surgery

## 2022-05-12 ENCOUNTER — Encounter: Payer: Self-pay | Admitting: Urgent Care

## 2022-05-12 DIAGNOSIS — G4733 Obstructive sleep apnea (adult) (pediatric): Secondary | ICD-10-CM | POA: Diagnosis not present

## 2022-05-12 DIAGNOSIS — N184 Chronic kidney disease, stage 4 (severe): Secondary | ICD-10-CM | POA: Diagnosis not present

## 2022-05-12 DIAGNOSIS — Z79899 Other long term (current) drug therapy: Secondary | ICD-10-CM | POA: Diagnosis not present

## 2022-05-12 DIAGNOSIS — Z01818 Encounter for other preprocedural examination: Secondary | ICD-10-CM | POA: Diagnosis present

## 2022-05-12 LAB — CBC
HCT: 34 % — ABNORMAL LOW (ref 36.0–46.0)
Hemoglobin: 10.8 g/dL — ABNORMAL LOW (ref 12.0–15.0)
MCH: 27.6 pg (ref 26.0–34.0)
MCHC: 31.8 g/dL (ref 30.0–36.0)
MCV: 86.7 fL (ref 80.0–100.0)
Platelets: 132 10*3/uL — ABNORMAL LOW (ref 150–400)
RBC: 3.92 MIL/uL (ref 3.87–5.11)
RDW: 13.8 % (ref 11.5–15.5)
WBC: 6.3 10*3/uL (ref 4.0–10.5)
nRBC: 0 % (ref 0.0–0.2)

## 2022-05-12 LAB — BASIC METABOLIC PANEL
Anion gap: 11 (ref 5–15)
BUN: 78 mg/dL — ABNORMAL HIGH (ref 8–23)
CO2: 20 mmol/L — ABNORMAL LOW (ref 22–32)
Calcium: 8 mg/dL — ABNORMAL LOW (ref 8.9–10.3)
Chloride: 109 mmol/L (ref 98–111)
Creatinine, Ser: 2.86 mg/dL — ABNORMAL HIGH (ref 0.44–1.00)
GFR, Estimated: 16 mL/min — ABNORMAL LOW (ref >60)
Glucose, Bld: 185 mg/dL — ABNORMAL HIGH (ref 70–99)
Potassium: 4.4 mmol/L (ref 3.5–5.1)
Sodium: 140 mmol/L (ref 135–145)

## 2022-05-12 LAB — TYPE AND SCREEN
ABO/RH(D): O POS
Antibody Screen: NEGATIVE

## 2022-05-18 MED ORDER — ORAL CARE MOUTH RINSE: 15.0000 mL | Freq: Once | OROMUCOSAL | Status: AC

## 2022-05-18 MED ORDER — SODIUM CHLORIDE 0.9 % IV SOLN: INTRAVENOUS | Status: DC

## 2022-05-18 MED ORDER — CHLORHEXIDINE GLUCONATE CLOTH 2 % EX PADS: 6.0000 | MEDICATED_PAD | Freq: Once | CUTANEOUS | Status: DC

## 2022-05-18 MED ORDER — CHLORHEXIDINE GLUCONATE 0.12 % MT SOLN: 15.0000 mL | Freq: Once | OROMUCOSAL | Status: AC

## 2022-05-18 MED ORDER — CHLORHEXIDINE GLUCONATE CLOTH 2 % EX PADS
6.0000 | MEDICATED_PAD | Freq: Once | CUTANEOUS | Status: AC
  Administered 2022-05-19: 2 via TOPICAL

## 2022-05-18 MED ORDER — CEFAZOLIN SODIUM-DEXTROSE 2-4 GM/100ML-% IV SOLN
2.0000 g | INTRAVENOUS | Status: AC
  Administered 2022-05-19: 2 g via INTRAVENOUS

## 2022-05-19 ENCOUNTER — Ambulatory Visit: Payer: Medicare Other | Admitting: Urgent Care

## 2022-05-19 ENCOUNTER — Other Ambulatory Visit: Payer: Self-pay

## 2022-05-19 ENCOUNTER — Encounter
Admission: RE | Disposition: A | Discharge status: Home / Self Care | Payer: Self-pay | Source: Home / Self Care | Attending: Vascular Surgery

## 2022-05-19 ENCOUNTER — Encounter: Payer: Self-pay | Admitting: Vascular Surgery

## 2022-05-19 ENCOUNTER — Ambulatory Visit
Admission: RE | Admit: 2022-05-19 | Discharge: 2022-05-19 | Disposition: A | Discharge status: Home / Self Care | Payer: Medicare Other | Attending: Vascular Surgery | Admitting: Vascular Surgery

## 2022-05-19 DIAGNOSIS — J45909 Unspecified asthma, uncomplicated: Secondary | ICD-10-CM | POA: Diagnosis not present

## 2022-05-19 DIAGNOSIS — K759 Inflammatory liver disease, unspecified: Secondary | ICD-10-CM | POA: Insufficient documentation

## 2022-05-19 DIAGNOSIS — K219 Gastro-esophageal reflux disease without esophagitis: Secondary | ICD-10-CM | POA: Insufficient documentation

## 2022-05-19 DIAGNOSIS — N186 End stage renal disease: Secondary | ICD-10-CM | POA: Insufficient documentation

## 2022-05-19 DIAGNOSIS — I132 Hypertensive heart and chronic kidney disease with heart failure and with stage 5 chronic kidney disease, or end stage renal disease: Secondary | ICD-10-CM | POA: Insufficient documentation

## 2022-05-19 DIAGNOSIS — E1122 Type 2 diabetes mellitus with diabetic chronic kidney disease: Secondary | ICD-10-CM | POA: Diagnosis not present

## 2022-05-19 DIAGNOSIS — E669 Obesity, unspecified: Secondary | ICD-10-CM | POA: Insufficient documentation

## 2022-05-19 DIAGNOSIS — I509 Heart failure, unspecified: Secondary | ICD-10-CM | POA: Diagnosis not present

## 2022-05-19 DIAGNOSIS — Z6835 Body mass index (BMI) 35.0-35.9, adult: Secondary | ICD-10-CM | POA: Diagnosis not present

## 2022-05-19 DIAGNOSIS — E890 Postprocedural hypothyroidism: Secondary | ICD-10-CM | POA: Diagnosis not present

## 2022-05-19 HISTORY — PX: AV FISTULA PLACEMENT: SHX1204

## 2022-05-19 LAB — GLUCOSE, CAPILLARY
Glucose-Capillary: 122 mg/dL — ABNORMAL HIGH (ref 70–99)
Glucose-Capillary: 127 mg/dL — ABNORMAL HIGH (ref 70–99)

## 2022-05-19 LAB — ABO/RH: ABO/RH(D): O POS

## 2022-05-19 SURGERY — ARTERIOVENOUS (AV) FISTULA CREATION
Anesthesia: General | Site: Arm Lower | Laterality: Left

## 2022-05-19 MED ORDER — PHENYLEPHRINE HCL-NACL 20-0.9 MG/250ML-% IV SOLN
INTRAVENOUS | Status: DC | PRN
  Administered 2022-05-19: 50 ug/min via INTRAVENOUS

## 2022-05-19 MED ORDER — ONDANSETRON HCL 4 MG/2ML IJ SOLN
INTRAMUSCULAR | Status: DC | PRN
  Administered 2022-05-19: 4 mg via INTRAVENOUS

## 2022-05-19 MED ORDER — LIDOCAINE HCL (CARDIAC) PF 100 MG/5ML IV SOSY
PREFILLED_SYRINGE | INTRAVENOUS | Status: DC | PRN
  Administered 2022-05-19: 60 mg via INTRAVENOUS

## 2022-05-19 MED ORDER — PAPAVERINE HCL 30 MG/ML IJ SOLN
INTRAMUSCULAR | Status: AC
Start: 2022-05-19 — End: ?
  Filled 2022-05-19: qty 2

## 2022-05-19 MED ORDER — ROCURONIUM BROMIDE 100 MG/10ML IV SOLN
INTRAVENOUS | Status: DC | PRN
  Administered 2022-05-19: 40 mg via INTRAVENOUS

## 2022-05-19 MED ORDER — FENTANYL CITRATE (PF) 100 MCG/2ML IJ SOLN
INTRAMUSCULAR | Status: AC
  Filled 2022-05-19: qty 2

## 2022-05-19 MED ORDER — HEPARIN SODIUM (PORCINE) 5000 UNIT/ML IJ SOLN
INTRAMUSCULAR | Status: AC
  Filled 2022-05-19: qty 1

## 2022-05-19 MED ORDER — PROPOFOL 10 MG/ML IV BOLUS
INTRAVENOUS | Status: AC
  Filled 2022-05-19: qty 20

## 2022-05-19 MED ORDER — SUGAMMADEX SODIUM 200 MG/2ML IV SOLN
INTRAVENOUS | Status: DC | PRN
  Administered 2022-05-19: 180 mg via INTRAVENOUS

## 2022-05-19 MED ORDER — PHENYLEPHRINE HCL (PRESSORS) 10 MG/ML IV SOLN
INTRAVENOUS | Status: AC
  Filled 2022-05-19: qty 1

## 2022-05-19 MED ORDER — BUPIVACAINE LIPOSOME 1.3 % IJ SUSP
INTRAMUSCULAR | Status: AC
  Filled 2022-05-19: qty 20

## 2022-05-19 MED ORDER — SODIUM CHLORIDE 0.9 % IV SOLN
INTRAVENOUS | Status: DC | PRN
  Administered 2022-05-19: 501 mL

## 2022-05-19 MED ORDER — CHLORHEXIDINE GLUCONATE 0.12 % MT SOLN
OROMUCOSAL | Status: AC
  Administered 2022-05-19: 15 mL via OROMUCOSAL
  Filled 2022-05-19: qty 15

## 2022-05-19 MED ORDER — BUPIVACAINE-EPINEPHRINE (PF) 0.5% -1:200000 IJ SOLN
INTRAMUSCULAR | Status: AC
  Filled 2022-05-19: qty 30

## 2022-05-19 MED ORDER — FENTANYL CITRATE (PF) 100 MCG/2ML IJ SOLN
INTRAMUSCULAR | Status: DC | PRN
Start: 2022-05-19 — End: 2022-05-19
  Administered 2022-05-19: 25 ug via INTRAVENOUS
  Administered 2022-05-19: 50 ug via INTRAVENOUS

## 2022-05-19 MED ORDER — HEMOSTATIC AGENTS (NO CHARGE) OPTIME
TOPICAL | Status: DC | PRN
  Administered 2022-05-19: 1 via TOPICAL

## 2022-05-19 MED ORDER — HYDROCODONE-ACETAMINOPHEN 5-325 MG PO TABS: 1.0000 | ORAL_TABLET | Freq: Four times a day (QID) | ORAL | 0 refills | Status: DC | PRN

## 2022-05-19 MED ORDER — PROPOFOL 10 MG/ML IV BOLUS
INTRAVENOUS | Status: DC | PRN
  Administered 2022-05-19: 70 mg via INTRAVENOUS

## 2022-05-19 MED ORDER — CEFAZOLIN SODIUM-DEXTROSE 2-4 GM/100ML-% IV SOLN
INTRAVENOUS | Status: AC
  Filled 2022-05-19: qty 100

## 2022-05-19 MED ORDER — DEXAMETHASONE SODIUM PHOSPHATE 10 MG/ML IJ SOLN
INTRAMUSCULAR | Status: DC | PRN
  Administered 2022-05-19: 10 mg via INTRAVENOUS

## 2022-05-19 MED ORDER — BUPIVACAINE-EPINEPHRINE (PF) 0.5% -1:200000 IJ SOLN
INTRAMUSCULAR | Status: DC | PRN
  Administered 2022-05-19: 20 mL via INTRAMUSCULAR

## 2022-05-19 SURGICAL SUPPLY — 61 items
ADH SKN CLS APL DERMABOND .7 (GAUZE/BANDAGES/DRESSINGS) ×1
APL PRP STRL LF DISP 70% ISPRP (MISCELLANEOUS) ×1
APPLIER CLIP 11 MED OPEN (CLIP)
APPLIER CLIP 9.375 SM OPEN (CLIP)
APR CLP MED 11 20 MLT OPN (CLIP)
APR CLP SM 9.3 20 MLT OPN (CLIP)
BAG DECANTER FOR FLEXI CONT (MISCELLANEOUS) ×1 IMPLANT
BLADE SURG SZ11 CARB STEEL (BLADE) ×1 IMPLANT
BOOT SUTURE AID YELLOW STND (SUTURE) ×1 IMPLANT
BRUSH SCRUB EZ  4% CHG (MISCELLANEOUS) ×1
BRUSH SCRUB EZ 4% CHG (MISCELLANEOUS) ×1 IMPLANT
CHLORAPREP W/TINT 26 (MISCELLANEOUS) ×1 IMPLANT
CLIP APPLIE 11 MED OPEN (CLIP) IMPLANT
CLIP APPLIE 9.375 SM OPEN (CLIP) IMPLANT
DERMABOND ADVANCED (GAUZE/BANDAGES/DRESSINGS) ×1
DERMABOND ADVANCED .7 DNX12 (GAUZE/BANDAGES/DRESSINGS) ×1 IMPLANT
DRESSING SURGICEL FIBRLLR 1X2 (HEMOSTASIS) ×1 IMPLANT
DRSG SURGICEL FIBRILLAR 1X2 (HEMOSTASIS) ×1
ELECT CAUTERY BLADE 6.4 (BLADE) ×1 IMPLANT
ELECT REM PT RETURN 9FT ADLT (ELECTROSURGICAL) ×1
ELECTRODE REM PT RTRN 9FT ADLT (ELECTROSURGICAL) ×1 IMPLANT
GEL ULTRASOUND 20GR AQUASONIC (MISCELLANEOUS) IMPLANT
GLOVE SURG SYN 8.0 (GLOVE) ×1 IMPLANT
GLOVE SURG SYN 8.0 PF PI (GLOVE) ×1 IMPLANT
GOWN STRL REUS W/ TWL LRG LVL3 (GOWN DISPOSABLE) ×1 IMPLANT
GOWN STRL REUS W/ TWL XL LVL3 (GOWN DISPOSABLE) ×1 IMPLANT
GOWN STRL REUS W/TWL LRG LVL3 (GOWN DISPOSABLE) ×1
GOWN STRL REUS W/TWL XL LVL3 (GOWN DISPOSABLE) ×1
IV NS 500ML (IV SOLUTION) ×1
IV NS 500ML BAXH (IV SOLUTION) ×1 IMPLANT
KIT TURNOVER KIT A (KITS) ×1 IMPLANT
LABEL OR SOLS (LABEL) ×1 IMPLANT
LOOP RED MAXI  1X406MM (MISCELLANEOUS) ×1
LOOP VESSEL MAXI 1X406 RED (MISCELLANEOUS) ×1 IMPLANT
LOOP VESSEL MINI 0.8X406 BLUE (MISCELLANEOUS) ×2 IMPLANT
LOOPS BLUE MINI 0.8X406MM (MISCELLANEOUS) ×2
MANIFOLD NEPTUNE II (INSTRUMENTS) ×1 IMPLANT
NDL FILTER BLUNT 18X1 1/2 (NEEDLE) ×1 IMPLANT
NDL HYPO 30X.5 LL (NEEDLE) IMPLANT
NEEDLE FILTER BLUNT 18X 1/2SAF (NEEDLE) ×1
NEEDLE FILTER BLUNT 18X1 1/2 (NEEDLE) ×1 IMPLANT
NEEDLE HYPO 30X.5 LL (NEEDLE) IMPLANT
PACK EXTREMITY ARMC (MISCELLANEOUS) ×1 IMPLANT
PAD PREP 24X41 OB/GYN DISP (PERSONAL CARE ITEMS) ×1 IMPLANT
STOCKINETTE 48X4 2 PLY STRL (GAUZE/BANDAGES/DRESSINGS) ×1 IMPLANT
STOCKINETTE STRL 4IN 9604848 (GAUZE/BANDAGES/DRESSINGS) ×1 IMPLANT
SUT MNCRL+ 5-0 UNDYED PC-3 (SUTURE) ×1 IMPLANT
SUT MONOCRYL 5-0 (SUTURE) ×1
SUT PROLENE 6 0 BV (SUTURE) ×4 IMPLANT
SUT SILK 2 0 (SUTURE) ×1
SUT SILK 2-0 18XBRD TIE 12 (SUTURE) ×1 IMPLANT
SUT SILK 3 0 (SUTURE) ×1
SUT SILK 3-0 18XBRD TIE 12 (SUTURE) ×1 IMPLANT
SUT SILK 4 0 (SUTURE) ×1
SUT SILK 4-0 18XBRD TIE 12 (SUTURE) ×1 IMPLANT
SUT VIC AB 3-0 SH 27 (SUTURE) ×1
SUT VIC AB 3-0 SH 27X BRD (SUTURE) ×1 IMPLANT
SYR 20ML LL LF (SYRINGE) ×1 IMPLANT
SYR 3ML LL SCALE MARK (SYRINGE) ×1 IMPLANT
TRAP FLUID SMOKE EVACUATOR (MISCELLANEOUS) ×1 IMPLANT
WATER STERILE IRR 500ML POUR (IV SOLUTION) ×1 IMPLANT

## 2022-05-19 NOTE — Anesthesia Postprocedure Evaluation (Signed)
Anesthesia Post Note  Patient: [name]  Procedure(s) Performed: ARTERIOVENOUS (AV) FISTULA CREATION ( BRACHIAL CEPHALIC ) (Left: Arm Lower)  Patient location during evaluation: PACU Anesthesia Type: General Level of consciousness: awake and alert, oriented and patient cooperative Pain management: pain level controlled Vital Signs Assessment: post-procedure vital signs reviewed and stable Respiratory status: spontaneous breathing, nonlabored ventilation and respiratory function stable Cardiovascular status: blood pressure returned to baseline and stable Postop Assessment: adequate PO intake Anesthetic complications: yes   Encounter Notable Events  Notable Event Outcome Phase Comment  Difficult to intubate - unexpected  Intraprocedure Filed from anesthesia note documentation.     Last Vitals:  Vitals:   05/19/22 0930 05/19/22 0945  BP: (!) 118/54 (!) 118/55  Pulse: 60 (!) 59  Resp: 20 14  Temp:  36.8 C  SpO2: 93% 95%    Last Pain:  Vitals:   05/19/22 0945  TempSrc:   PainSc: 0-No pain                 Darrin Nipper

## 2022-05-19 NOTE — Discharge Instructions (Signed)
AMBULATORY SURGERY  ?DISCHARGE INSTRUCTIONS ? ? ?The drugs that you were given will stay in your system until tomorrow so for the next 24 hours you should not: ? ?Drive an automobile ?Make any legal decisions ?Drink any alcoholic beverage ? ? ?You may resume regular meals tomorrow.  Today it is better to start with liquids and gradually work up to solid foods. ? ?You may eat anything you prefer, but it is better to start with liquids, then soup and crackers, and gradually work up to solid foods. ? ? ?Please notify your doctor immediately if you have any unusual bleeding, trouble breathing, redness and pain at the surgery site, drainage, fever, or pain not relieved by medication. ? ? ? ?Additional Instructions: ? ? ? ?Please contact your physician with any problems or Same Day Surgery at [phone], Monday through Friday 6 am to 4 pm, or [place] [facility] [place] Main number at [phone].  ?

## 2022-05-19 NOTE — Op Note (Signed)
     OPERATIVE NOTE   PROCEDURE: left brachial cephalic arteriovenous fistula placement  PRE-OPERATIVE DIAGNOSIS: End Stage Renal Disease  POST-OPERATIVE DIAGNOSIS: End Stage Renal Disease  SURGEON: [name]  ASSISTANT(S): None  ANESTHESIA: general  ESTIMATED BLOOD LOSS: <50 cc  FINDING(S): 4 mm cephalic vein  SPECIMEN(S):  none  INDICATIONS:   [name] is a 81 y.o. female who presents with end stage renal disease.  The patient is scheduled for left brachiocephalic arteriovenous fistula placement.  The patient is aware the risks include but are not limited to: bleeding, infection, steal syndrome, nerve damage, ischemic monomelic neuropathy, failure to mature, and need for additional procedures.  The patient is aware of the risks of the procedure and elects to proceed forward.  DESCRIPTION: After full informed written consent was obtained from the patient, the patient was brought back to the operating room and placed supine upon the operating table.  Prior to induction, the patient received IV antibiotics.   After obtaining adequate anesthesia, the patient was then prepped and draped in the standard fashion for a left arm access procedure.   A first assistant was required to provide a safe and appropriate environment for executing the surgery.  The assistant was integral in providing retraction, exposure, running suture providing suction and in the closing process.   A curvilinear incision was then created midway between the radial impulse and the cephalic vein. The cephalic vein was then identified and dissected circumferentially. It was marked with a surgical marker.    Attention was then turned to the brachial artery which was exposed through the same incision and looped proximally and distally. Side branches were controlled with 4-0 silk ties.  The distal segment of the vein was ligated with a  2-0 silk, and the vein was transected.  The proximal segment was  interrogated with serial dilators.  The vein accepted up to a 4 mm dilator without any difficulty. Heparinized saline was infused into the vein and clamped it with a small bulldog.  At this point, I reset my exposure of the brachial artery and controlled the artery with vessel loops proximally and distally.  An arteriotomy was then made with a #11 blade, and extended with a Potts scissor.  Heparinized saline was injected proximal and distal into the radial artery.  The vein was then approximated to the artery while the artery was in its native bed and subsequently the vein was beveled using Potts scissors. The vein was then sewn to the artery in an end-to-side configuration with a running stitch of 6-0 Prolene.  Prior to completing this anastomosis Flushing maneuvers were performed and the artery was allowed to forward and back bleed.  There was no evidence of clot from any vessels.  I completed the anastomosis in the usual fashion and then released all vessel loops and clamps.    There was good  thrill in the venous outflow, and there was 1+ palpable radial pulse.  At this point, I irrigated out the surgical wound.  There was no further active bleeding.  The subcutaneous tissue was reapproximated with a running stitch of 3-0 Vicryl.  The skin was then reapproximated with a running subcuticular stitch of 4-0 Vicryl.  The skin was then cleaned, dried, and reinforced with Dermabond.    The patient tolerated this procedure well.   COMPLICATIONS: None  CONDITION: [name]  [facility]  Office: [phone]   05/19/2022, 9:44 AM

## 2022-05-19 NOTE — Progress Notes (Signed)
MRN : [id number]  [name] is a 81 y.o. (05-Aug-1941) female who presents with chief complaint of check access.  History of Present Illness:   The patient presents today for creation of dialysis access. The patient has chronic renal insufficiency stage V. The patient's most recent creatinine clearance is less than 20, however it vacillates.  The patient volume status has not yet become an issue. Patient's blood pressures been relatively well controlled. There are mild uremic symptoms which appear to be relatively well tolerated at this time.   The patient notes the kidney problem has been present for a long time and has been progressively getting worse.   The patient is followed by nephrology.     The patient is right-handed.   The patient has been considering the various methods of dialysis and wishes to proceed with hemodialysis and therefore creation of AV access.  The patient also has a family history of dialysis.  The patient should have a left brachiocephalic AV fistula created    Current Meds  Medication Sig   acetaminophen (TYLENOL) 500 MG tablet Take 1,000 mg by mouth every 6 (six) hours as needed (pain).   albuterol (PROVENTIL HFA;VENTOLIN HFA) 108 (90 BASE) MCG/ACT inhaler Inhale 1-2 puffs into the lungs every 4 (four) hours as needed for wheezing or shortness of breath.   amLODipine (NORVASC) 10 MG tablet Take 10 mg by mouth daily.   azelastine (ASTELIN) 0.1 % nasal spray ONE SPRAY INTO BOTH NOSTRILS TWICE DAILY   esomeprazole (NEXIUM) 40 MG packet Take 40 mg by mouth daily before breakfast.   Fluticasone-Salmeterol (ADVAIR) 250-50 MCG/DOSE AEPB Inhale 1 puff into the lungs 2 (two) times daily as needed (asthma).    hydrALAZINE (APRESOLINE) 100 MG tablet Take 100 mg by mouth 2 (two) times daily.   levothyroxine (SYNTHROID) 137 MCG tablet Take 1 tablet (137 mcg total) by mouth daily at 6 (six) AM.   loratadine (CLARITIN) 10 MG tablet Take 10 mg  by mouth daily.   losartan (COZAAR) 100 MG tablet Take 100 mg by mouth daily.   metoprolol tartrate (LOPRESSOR) 50 MG tablet Take 50 mg by mouth 2 (two) times daily.    montelukast (SINGULAIR) 10 MG tablet Take 10 mg by mouth at bedtime.   potassium chloride SA (K-DUR,KLOR-CON) 20 MEQ tablet Take 20 mEq by mouth 2 (two) times daily.   rosuvastatin (CRESTOR) 40 MG tablet Take 40 mg by mouth daily.   sertraline (ZOLOFT) 100 MG tablet Take 100 mg by mouth daily.   spironolactone (ALDACTONE) 25 MG tablet Take 1 tablet by mouth daily.   torsemide (DEMADEX) 10 MG tablet Take 10-20 mg by mouth daily.     Past Medical History:  Diagnosis Date   Actinic keratosis    Albuminuria    Anemia    Arthritis    Asthma    Basal cell carcinoma 04/12/2017   Above right lateral brow. Nodulocystic type. EDC   Cancer ([place])    skin   Cataract cortical, senile    CHF (congestive heart failure) ([place])    Diabetes mellitus without complication (HCC)    GERD (gastroesophageal reflux disease)    Hemorrhoids    History of kidney stones    Hyperlipidemia    Hypertension    Hypothyroidism    Lyme disease    No kidney function  OSA (obstructive sleep apnea)    Osteoporosis    Osteoporosis    Reflux esophagitis    Steatohepatitis    Steatohepatitis     Past Surgical History:  Procedure Laterality Date   ABDOMINAL HYSTERECTOMY     APPENDECTOMY     CARDIAC CATHETERIZATION  1980   [place]  08/13/2014   [facility]. no significant CAD, normal LVEDP.    CATARACT EXTRACTION     CHOLECYSTECTOMY     COLONOSCOPY     COLONOSCOPY WITH PROPOFOL N/A 12/07/2016   Procedure: COLONOSCOPY WITH PROPOFOL;  Surgeon: Lollie Sails, MD;  Location[facility] ENDOSCOPY;  Service: Endoscopy;  Laterality: N/A;   ESOPHAGOGASTRODUODENOSCOPY     ESOPHAGOGASTRODUODENOSCOPY (EGD) WITH PROPOFOL N/A 12/07/2016   Procedure: ESOPHAGOGASTRODUODENOSCOPY (EGD) WITH PROPOFOL;  Surgeon: Lollie Sails, MD;  Location:  [facility] ENDOSCOPY;  Service: Endoscopy;  Laterality: N/A;   ESOPHAGOGASTRODUODENOSCOPY (EGD) WITH PROPOFOL N/A 01/07/2018   Procedure: ESOPHAGOGASTRODUODENOSCOPY (EGD) WITH PROPOFOL;  Surgeon: Lollie Sails, MD;  Location: [facility] ENDOSCOPY;  Service: Endoscopy;  Laterality: N/A;   EYE SURGERY     HEMORRHOID SURGERY     PARTIAL HYSTERECTOMY     THYROIDECTOMY N/A 08/25/2019   Procedure: THYROIDECTOMY EXTRACTION OF SUBTOTAL COMPONENT; PARATHYROID AUTOTRANSPLANT X1;  Surgeon: Fredirick Maudlin, MD;  Location: [facility] ORS;  Service: General;  Laterality: N/A;  With Nerve Monitoring(RLN)   TONSILLECTOMY      Social History Social History   Tobacco Use   Smoking status: Never   Smokeless tobacco: Never  Vaping Use   Vaping Use: Never used  Substance Use Topics   Alcohol use: No   Drug use: No    Family History Family History  Problem Relation Age of Onset   Breast cancer Mother    Heart attack Father     Allergies  Allergen Reactions   Ace Inhibitors     Other reaction(s): Unknown   Eggs Or Egg-Derived Products Diarrhea   Other     Other reaction(s): Other (See Comments) Eggs   Prednisone     Other reaction(s): Other (See Comments) joint pain   Risedronate     Other reaction(s): Other (See Comments)   Sulfa Antibiotics     Other reaction(s): Other (See Comments)   Sulfasalazine Other (See Comments)     REVIEW OF SYSTEMS (Negative unless checked)  Constitutional: [] Weight loss  [] Fever  [] Chills Cardiac: [] Chest pain   [] Chest pressure   [] Palpitations   [] Shortness of breath when laying flat   [] Shortness of breath with exertion. Vascular:  [] Pain in legs with walking   [] Pain in legs at rest  [] History of DVT   [] Phlebitis   [] Swelling in legs   [] Varicose veins   [] Non-healing ulcers Pulmonary:   [] Uses home oxygen   [] Productive cough   [] Hemoptysis   [] Wheeze  [] COPD   [] Asthma Neurologic:  [] Dizziness   [] Seizures   [] History of stroke   [] History of TIA  [] Aphasia    [] Vissual changes   [] Weakness or numbness in arm   [] Weakness or numbness in leg Musculoskeletal:   [] Joint swelling   [] Joint pain   [] Low back pain Hematologic:  [] Easy bruising  [] Easy bleeding   [] Hypercoagulable state   [] Anemic Gastrointestinal:  [] Diarrhea   [] Vomiting  [] Gastroesophageal reflux/heartburn   [] Difficulty swallowing. Genitourinary:  [x] Chronic kidney disease   [] Difficult urination  [] Frequent urination   [] Blood in urine Skin:  [] Rashes   [] Ulcers  Psychological:  [] History of anxiety   []   History of major depression.  Physical Examination  Vitals:   05/19/22 0630  BP: (!) 151/57  Pulse: (!) 56  Resp: 18  Temp: 97.7 F (36.5 C)  TempSrc: Tympanic  SpO2: 97%  Weight: 87.8 kg  Height: 5' 2"  (1.575 m)   Body mass index is 35.39 kg/m. Gen: WD/WN, NAD Head: [place]/AT, No temporalis wasting.  Ear/Nose/Throat: Hearing grossly intact, nares w/o erythema or drainage Eyes: PER, EOMI, sclera nonicteric.  Neck: Supple, no gross masses or lesions.  No JVD.  Pulmonary:  Good air movement, no audible wheezing, no use of accessory muscles.  Cardiac: RRR, precordium non-hyperdynamic. Vascular:    Vessel Right Left  Radial Palpable Palpable  Brachial Palpable Palpable  Gastrointestinal: soft, non-distended. No guarding/no peritoneal signs.  Musculoskeletal: M/S 5/5 throughout.  No deformity.  Neurologic: CN 2-12 intact. Pain and light touch intact in extremities.  Symmetrical.  Speech is fluent. Motor exam as listed above. Psychiatric: Judgment intact, Mood & affect appropriate for pt's clinical situation. Dermatologic: No rashes or ulcers noted.  No changes consistent with cellulitis.   CBC Lab Results  Component Value Date   WBC 6.3 05/12/2022   HGB 10.8 (L) 05/12/2022   HCT 34.0 (L) 05/12/2022   MCV 86.7 05/12/2022   PLT 132 (L) 05/12/2022    BMET    Component Value Date/Time   NA 140 05/12/2022 1042   NA 141 08/11/2014 0850   NA 142 06/20/2012 1353   K  4.4 05/12/2022 1042   K 3.2 (L) 06/20/2012 1353   CL 109 05/12/2022 1042   CL 103 06/20/2012 1353   CO2 20 (L) 05/12/2022 1042   CO2 29 06/20/2012 1353   GLUCOSE 185 (H) 05/12/2022 1042   GLUCOSE 84 06/20/2012 1353   BUN 78 (H) 05/12/2022 1042   BUN 16 08/11/2014 0850   BUN 13 06/20/2012 1353   CREATININE 2.86 (H) 05/12/2022 1042   CREATININE 0.65 06/20/2012 1353   CALCIUM 8.0 (L) 05/12/2022 1042   CALCIUM 9.4 06/20/2012 1353   GFRNONAA 16 (L) 05/12/2022 1042   GFRNONAA >60 06/20/2012 1353   GFRAA >60 12/31/2016 0318   GFRAA >60 06/20/2012 1353   Estimated Creatinine Clearance: 15.9 mL/min (A) (by C-G formula based on SCr of 2.86 mg/dL (H)).  COAG Lab Results  Component Value Date   INR 1.1 08/11/2014    Radiology No results found.   Assessment/Plan 1. Chronic kidney disease (CKD), stage IV (severe) (HCC) Recommend:   At this time the patient does not have appropriate extremity access for dialysis   Patient should have a left brachiocephalic AV fistula created.   The risks, benefits and alternative therapies were reviewed in detail with the patient.  All questions were answered.  The patient agrees to proceed with surgery.    The patient will follow up with me in the office after the surgery.    2. Diabetes mellitus type 2 in obese [facility]) Continue hypoglycemic medications as already ordered, these medications have been reviewed and there are no changes at this time.   Hgb A1C to be monitored as already arranged by primary service    [name], MD  05/19/2022 7:26 AM

## 2022-05-19 NOTE — Anesthesia Procedure Notes (Signed)
Procedure Name: Intubation Date/Time: 05/19/2022 7:59 AM  Performed by: Nelda Marseille, CRNAPre-anesthesia Checklist: Patient identified, Patient being monitored, Timeout performed, Emergency Drugs available and Suction available Patient Re-evaluated:Patient Re-evaluated prior to induction Oxygen Delivery Method: Circle System Utilized Preoxygenation: Pre-oxygenation with 100% oxygen Induction Type: IV induction Ventilation: Mask ventilation without difficulty Laryngoscope Size: Mac, 3 and McGraph Grade View: Grade I Tube type: Oral Tube size: 7.0 mm Number of attempts: 1 Airway Equipment and Method: Stylet and Oral airway Placement Confirmation: ETT inserted through vocal cords under direct vision, positive ETCO2 and breath sounds checked- equal and bilateral Secured at: 21 cm Tube secured with: Tape Dental Injury: Teeth and Oropharynx as per pre-operative assessment  Difficulty Due To: Difficulty was unanticipated

## 2022-05-19 NOTE — Interval H&P Note (Signed)
History and Physical Interval Note:  05/19/2022 7:27 AM  [name]  has presented today for surgery, with the diagnosis of CHRONIC RENAL FAILURE STAGE 3.  The various methods of treatment have been discussed with the patient and family. After consideration of risks, benefits and other options for treatment, the patient has consented to  Procedure(s): ARTERIOVENOUS (AV) FISTULA CREATION ( BRACHIAL CEPHALIC ) (Left) as a surgical intervention.  The patient's history has been reviewed, patient examined, no change in status, stable for surgery.  I have reviewed the patient's chart and labs.  Questions were answered to the patient's satisfaction.     [name]

## 2022-05-19 NOTE — Transfer of Care (Signed)
Immediate Anesthesia Transfer of Care Note  Patient: [name]  Procedure(s) Performed: ARTERIOVENOUS (AV) FISTULA CREATION ( BRACHIAL CEPHALIC ) (Left: Arm Lower)  Patient Location: PACU  Anesthesia Type:General  Level of Consciousness: awake, alert  and oriented  Airway & Oxygen Therapy: Patient Spontanous Breathing and Patient connected to face mask oxygen  Post-op Assessment: Report given to RN and Post -op Vital signs reviewed and stable  Post vital signs: Reviewed and stable  Last Vitals:  Vitals Value Taken Time  BP 149/62 05/19/22 0913  Temp 36.8 C 05/19/22 0913  Pulse 54 05/19/22 0915  Resp 23 05/19/22 0915  SpO2 99 % 05/19/22 0915  Vitals shown include unvalidated device data.  Last Pain:  Vitals:   05/19/22 0913  TempSrc:   PainSc: Asleep         Complications:  Encounter Notable Events  Notable Event Outcome Phase Comment  Difficult to intubate - unexpected  Intraprocedure Filed from anesthesia note documentation.

## 2022-05-19 NOTE — Anesthesia Preprocedure Evaluation (Addendum)
Anesthesia Evaluation  Patient identified by MRN, date of birth, ID band Patient awake    Reviewed: Allergy & Precautions, NPO status , Patient's Chart, lab work & pertinent test results  History of Anesthesia Complications Negative for: history of anesthetic complications  Airway Mallampati: III   Neck ROM: Full    Dental no notable dental hx.    Pulmonary asthma ,    Pulmonary exam normal breath sounds clear to auscultation       Cardiovascular hypertension, +CHF  Normal cardiovascular exam Rhythm:Regular Rate:Normal  ECG 05/12/22:  Sinus bradycardia Otherwise normal ECG When compared with ECG of 19-Aug-2019 10:56, No significant change was found  Echo 10/13/19:  NORMAL LEFT VENTRICULAR SYSTOLIC FUNCTION  NORMAL RIGHT VENTRICULAR SYSTOLIC FUNCTION  MILD VALVULAR REGURGITATION  NO VALVULAR STENOSIS     Neuro/Psych negative neurological ROS     GI/Hepatic GERD  ,(+) Hepatitis -  Endo/Other  diabetes, Type 2Hypothyroidism Obesity   Renal/GU Renal disease (stage IV CKD)     Musculoskeletal   Abdominal   Peds  Hematology negative hematology ROS (+)   Anesthesia Other Findings   Reproductive/Obstetrics                            Anesthesia Physical Anesthesia Plan  ASA: 3  Anesthesia Plan: General   Post-op Pain Management:    Induction: Intravenous  PONV Risk Score and Plan: 3 and Ondansetron, Dexamethasone and Treatment may vary due to age or medical condition  Airway Management Planned: Oral ETT  Additional Equipment:   Intra-op Plan:   Post-operative Plan: Extubation in OR  Informed Consent: I have reviewed the patients History and Physical, chart, labs and discussed the procedure including the risks, benefits and alternatives for the proposed anesthesia with the patient or authorized representative who has indicated his/her understanding and acceptance.     Dental  advisory given  Plan Discussed with: CRNA  Anesthesia Plan Comments: (Patient consented for risks of anesthesia including but not limited to:  - adverse reactions to medications - damage to eyes, teeth, lips or other oral mucosa - nerve damage due to positioning  - sore throat or hoarseness - damage to heart, brain, nerves, lungs, other parts of body or loss of life  Informed patient about role of CRNA in peri- and intra-operative care.  Patient voiced understanding.)        Anesthesia Quick Evaluation

## 2022-05-19 NOTE — Progress Notes (Signed)
   05/19/22 0700  Clinical Encounter Type  Visited With Patient and family together  Visit Type Initial;Pre-op  Spiritual Encounters  Spiritual Needs Prayer   Chaplain provided pre-op spiritual care support.

## 2022-05-19 NOTE — H&P (View-Only) (Signed)
MRN : [id number]  [name] is a 81 y.o. (10/09/1940) female who presents with chief complaint of check access.  History of Present Illness:   The patient presents today for creation of dialysis access. The patient has chronic renal insufficiency stage V. The patient's most recent creatinine clearance is less than 20, however it vacillates.  The patient volume status has not yet become an issue. Patient's blood pressures been relatively well controlled. There are mild uremic symptoms which appear to be relatively well tolerated at this time.   The patient notes the kidney problem has been present for a long time and has been progressively getting worse.   The patient is followed by nephrology.     The patient is right-handed.   The patient has been considering the various methods of dialysis and wishes to proceed with hemodialysis and therefore creation of AV access.  The patient also has a family history of dialysis.  The patient should have a left brachiocephalic AV fistula created    Current Meds  Medication Sig   acetaminophen (TYLENOL) 500 MG tablet Take 1,000 mg by mouth every 6 (six) hours as needed (pain).   albuterol (PROVENTIL HFA;VENTOLIN HFA) 108 (90 BASE) MCG/ACT inhaler Inhale 1-2 puffs into the lungs every 4 (four) hours as needed for wheezing or shortness of breath.   amLODipine (NORVASC) 10 MG tablet Take 10 mg by mouth daily.   azelastine (ASTELIN) 0.1 % nasal spray ONE SPRAY INTO BOTH NOSTRILS TWICE DAILY   esomeprazole (NEXIUM) 40 MG packet Take 40 mg by mouth daily before breakfast.   Fluticasone-Salmeterol (ADVAIR) 250-50 MCG/DOSE AEPB Inhale 1 puff into the lungs 2 (two) times daily as needed (asthma).    hydrALAZINE (APRESOLINE) 100 MG tablet Take 100 mg by mouth 2 (two) times daily.   levothyroxine (SYNTHROID) 137 MCG tablet Take 1 tablet (137 mcg total) by mouth daily at 6 (six) AM.   loratadine (CLARITIN) 10 MG tablet Take 10 mg  by mouth daily.   losartan (COZAAR) 100 MG tablet Take 100 mg by mouth daily.   metoprolol tartrate (LOPRESSOR) 50 MG tablet Take 50 mg by mouth 2 (two) times daily.    montelukast (SINGULAIR) 10 MG tablet Take 10 mg by mouth at bedtime.   potassium chloride SA (K-DUR,KLOR-CON) 20 MEQ tablet Take 20 mEq by mouth 2 (two) times daily.   rosuvastatin (CRESTOR) 40 MG tablet Take 40 mg by mouth daily.   sertraline (ZOLOFT) 100 MG tablet Take 100 mg by mouth daily.   spironolactone (ALDACTONE) 25 MG tablet Take 1 tablet by mouth daily.   torsemide (DEMADEX) 10 MG tablet Take 10-20 mg by mouth daily.     Past Medical History:  Diagnosis Date   Actinic keratosis    Albuminuria    Anemia    Arthritis    Asthma    Basal cell carcinoma 04/12/2017   Above right lateral brow. Nodulocystic type. EDC   Cancer ([place])    skin   Cataract cortical, senile    CHF (congestive heart failure) ([place])    Diabetes mellitus without complication (HCC)    GERD (gastroesophageal reflux disease)    Hemorrhoids    History of kidney stones    Hyperlipidemia    Hypertension    Hypothyroidism    Lyme disease    No kidney function  OSA (obstructive sleep apnea)    Osteoporosis    Osteoporosis    Reflux esophagitis    Steatohepatitis    Steatohepatitis     Past Surgical History:  Procedure Laterality Date   ABDOMINAL HYSTERECTOMY     APPENDECTOMY     CARDIAC CATHETERIZATION  1980   [place]  08/13/2014   [facility]. no significant CAD, normal LVEDP.    CATARACT EXTRACTION     CHOLECYSTECTOMY     COLONOSCOPY     COLONOSCOPY WITH PROPOFOL N/A 12/07/2016   Procedure: COLONOSCOPY WITH PROPOFOL;  Surgeon: Lollie Sails, MD;  Location: [facility] ENDOSCOPY;  Service: Endoscopy;  Laterality: N/A;   ESOPHAGOGASTRODUODENOSCOPY     ESOPHAGOGASTRODUODENOSCOPY (EGD) WITH PROPOFOL N/A 12/07/2016   Procedure: ESOPHAGOGASTRODUODENOSCOPY (EGD) WITH PROPOFOL;  Surgeon: Lollie Sails, MD;  Location:  [facility] ENDOSCOPY;  Service: Endoscopy;  Laterality: N/A;   ESOPHAGOGASTRODUODENOSCOPY (EGD) WITH PROPOFOL N/A 01/07/2018   Procedure: ESOPHAGOGASTRODUODENOSCOPY (EGD) WITH PROPOFOL;  Surgeon: Lollie Sails, MD;  Location: [facility] ENDOSCOPY;  Service: Endoscopy;  Laterality: N/A;   EYE SURGERY     HEMORRHOID SURGERY     PARTIAL HYSTERECTOMY     THYROIDECTOMY N/A 08/25/2019   Procedure: THYROIDECTOMY EXTRACTION OF SUBTOTAL COMPONENT; PARATHYROID AUTOTRANSPLANT X1;  Surgeon: Fredirick Maudlin, MD;  Location: [facility] ORS;  Service: General;  Laterality: N/A;  With Nerve Monitoring(RLN)   TONSILLECTOMY      Social History Social History   Tobacco Use   Smoking status: Never   Smokeless tobacco: Never  Vaping Use   Vaping Use: Never used  Substance Use Topics   Alcohol use: No   Drug use: No    Family History Family History  Problem Relation Age of Onset   Breast cancer Mother    Heart attack Father     Allergies  Allergen Reactions   Ace Inhibitors     Other reaction(s): Unknown   Eggs Or Egg-Derived Products Diarrhea   Other     Other reaction(s): Other (See Comments) Eggs   Prednisone     Other reaction(s): Other (See Comments) joint pain   Risedronate     Other reaction(s): Other (See Comments)   Sulfa Antibiotics     Other reaction(s): Other (See Comments)   Sulfasalazine Other (See Comments)     REVIEW OF SYSTEMS (Negative unless checked)  Constitutional: [] Weight loss  [] Fever  [] Chills Cardiac: [] Chest pain   [] Chest pressure   [] Palpitations   [] Shortness of breath when laying flat   [] Shortness of breath with exertion. Vascular:  [] Pain in legs with walking   [] Pain in legs at rest  [] History of DVT   [] Phlebitis   [] Swelling in legs   [] Varicose veins   [] Non-healing ulcers Pulmonary:   [] Uses home oxygen   [] Productive cough   [] Hemoptysis   [] Wheeze  [] COPD   [] Asthma Neurologic:  [] Dizziness   [] Seizures   [] History of stroke   [] History of TIA  [] Aphasia    [] Vissual changes   [] Weakness or numbness in arm   [] Weakness or numbness in leg Musculoskeletal:   [] Joint swelling   [] Joint pain   [] Low back pain Hematologic:  [] Easy bruising  [] Easy bleeding   [] Hypercoagulable state   [] Anemic Gastrointestinal:  [] Diarrhea   [] Vomiting  [] Gastroesophageal reflux/heartburn   [] Difficulty swallowing. Genitourinary:  [x] Chronic kidney disease   [] Difficult urination  [] Frequent urination   [] Blood in urine Skin:  [] Rashes   [] Ulcers  Psychological:  [] History of anxiety   []   History of major depression.  Physical Examination  Vitals:   05/19/22 0630  BP: (!) 151/57  Pulse: (!) 56  Resp: 18  Temp: 97.7 F (36.5 C)  TempSrc: Tympanic  SpO2: 97%  Weight: 87.8 kg  Height: 5' 2"  (1.575 m)   Body mass index is 35.39 kg/m. Gen: WD/WN, NAD Head: [place]/AT, No temporalis wasting.  Ear/Nose/Throat: Hearing grossly intact, nares w/o erythema or drainage Eyes: PER, EOMI, sclera nonicteric.  Neck: Supple, no gross masses or lesions.  No JVD.  Pulmonary:  Good air movement, no audible wheezing, no use of accessory muscles.  Cardiac: RRR, precordium non-hyperdynamic. Vascular:    Vessel Right Left  Radial Palpable Palpable  Brachial Palpable Palpable  Gastrointestinal: soft, non-distended. No guarding/no peritoneal signs.  Musculoskeletal: M/S 5/5 throughout.  No deformity.  Neurologic: CN 2-12 intact. Pain and light touch intact in extremities.  Symmetrical.  Speech is fluent. Motor exam as listed above. Psychiatric: Judgment intact, Mood & affect appropriate for pt's clinical situation. Dermatologic: No rashes or ulcers noted.  No changes consistent with cellulitis.   CBC Lab Results  Component Value Date   WBC 6.3 05/12/2022   HGB 10.8 (L) 05/12/2022   HCT 34.0 (L) 05/12/2022   MCV 86.7 05/12/2022   PLT 132 (L) 05/12/2022    BMET    Component Value Date/Time   NA 140 05/12/2022 1042   NA 141 08/11/2014 0850   NA 142 06/20/2012 1353   K  4.4 05/12/2022 1042   K 3.2 (L) 06/20/2012 1353   CL 109 05/12/2022 1042   CL 103 06/20/2012 1353   CO2 20 (L) 05/12/2022 1042   CO2 29 06/20/2012 1353   GLUCOSE 185 (H) 05/12/2022 1042   GLUCOSE 84 06/20/2012 1353   BUN 78 (H) 05/12/2022 1042   BUN 16 08/11/2014 0850   BUN 13 06/20/2012 1353   CREATININE 2.86 (H) 05/12/2022 1042   CREATININE 0.65 06/20/2012 1353   CALCIUM 8.0 (L) 05/12/2022 1042   CALCIUM 9.4 06/20/2012 1353   GFRNONAA 16 (L) 05/12/2022 1042   GFRNONAA >60 06/20/2012 1353   GFRAA >60 12/31/2016 0318   GFRAA >60 06/20/2012 1353   Estimated Creatinine Clearance: 15.9 mL/min (A) (by C-G formula based on SCr of 2.86 mg/dL (H)).  COAG Lab Results  Component Value Date   INR 1.1 08/11/2014    Radiology No results found.   Assessment/Plan 1. Chronic kidney disease (CKD), stage IV (severe) (HCC) Recommend:   At this time the patient does not have appropriate extremity access for dialysis   Patient should have a left brachiocephalic AV fistula created.   The risks, benefits and alternative therapies were reviewed in detail with the patient.  All questions were answered.  The patient agrees to proceed with surgery.    The patient will follow up with me in the office after the surgery.    2. Diabetes mellitus type 2 in obese [facility]) Continue hypoglycemic medications as already ordered, these medications have been reviewed and there are no changes at this time.   Hgb A1C to be monitored as already arranged by primary service    [name], MD  05/19/2022 7:26 AM

## 2022-05-20 ENCOUNTER — Encounter: Payer: Self-pay | Admitting: Vascular Surgery

## 2022-06-08 ENCOUNTER — Emergency Department: Payer: Medicare Other

## 2022-06-08 ENCOUNTER — Emergency Department
Admission: EM | Admit: 2022-06-08 | Discharge: 2022-06-08 | Disposition: A | Discharge status: Home / Self Care | Payer: Medicare Other | Source: Home / Self Care | Attending: Emergency Medicine | Admitting: Emergency Medicine

## 2022-06-08 ENCOUNTER — Other Ambulatory Visit: Payer: Self-pay

## 2022-06-08 DIAGNOSIS — I129 Hypertensive chronic kidney disease with stage 1 through stage 4 chronic kidney disease, or unspecified chronic kidney disease: Secondary | ICD-10-CM | POA: Insufficient documentation

## 2022-06-08 DIAGNOSIS — U071 COVID-19: Secondary | ICD-10-CM | POA: Insufficient documentation

## 2022-06-08 DIAGNOSIS — E1122 Type 2 diabetes mellitus with diabetic chronic kidney disease: Secondary | ICD-10-CM | POA: Insufficient documentation

## 2022-06-08 DIAGNOSIS — R0602 Shortness of breath: Secondary | ICD-10-CM | POA: Insufficient documentation

## 2022-06-08 DIAGNOSIS — S2020XA Contusion of thorax, unspecified, initial encounter: Secondary | ICD-10-CM

## 2022-06-08 DIAGNOSIS — S20229A Contusion of unspecified back wall of thorax, initial encounter: Secondary | ICD-10-CM | POA: Insufficient documentation

## 2022-06-08 DIAGNOSIS — X58XXXA Exposure to other specified factors, initial encounter: Secondary | ICD-10-CM | POA: Insufficient documentation

## 2022-06-08 DIAGNOSIS — N189 Chronic kidney disease, unspecified: Secondary | ICD-10-CM | POA: Insufficient documentation

## 2022-06-08 DIAGNOSIS — J1282 Pneumonia due to coronavirus disease 2019: Secondary | ICD-10-CM | POA: Diagnosis not present

## 2022-06-08 LAB — CBC WITH DIFFERENTIAL/PLATELET
Abs Immature Granulocytes: 0.06 10*3/uL (ref 0.00–0.07)
Basophils Absolute: 0.1 10*3/uL (ref 0.0–0.1)
Basophils Relative: 1 %
Eosinophils Absolute: 0.5 10*3/uL (ref 0.0–0.5)
Eosinophils Relative: 6 %
HCT: 35.3 % — ABNORMAL LOW (ref 36.0–46.0)
Hemoglobin: 11.1 g/dL — ABNORMAL LOW (ref 12.0–15.0)
Immature Granulocytes: 1 %
Lymphocytes Relative: 29 %
Lymphs Abs: 2.4 10*3/uL (ref 0.7–4.0)
MCH: 28 pg (ref 26.0–34.0)
MCHC: 31.4 g/dL (ref 30.0–36.0)
MCV: 89.1 fL (ref 80.0–100.0)
Monocytes Absolute: 0.5 10*3/uL (ref 0.1–1.0)
Monocytes Relative: 6 %
Neutro Abs: 4.7 10*3/uL (ref 1.7–7.7)
Neutrophils Relative %: 57 %
Platelets: 190 10*3/uL (ref 150–400)
RBC: 3.96 MIL/uL (ref 3.87–5.11)
RDW: 14.1 % (ref 11.5–15.5)
WBC: 8.2 10*3/uL (ref 4.0–10.5)
nRBC: 0 % (ref 0.0–0.2)

## 2022-06-08 LAB — COMPREHENSIVE METABOLIC PANEL
ALT: 23 U/L (ref 0–44)
AST: 30 U/L (ref 15–41)
Albumin: 4.1 g/dL (ref 3.5–5.0)
Alkaline Phosphatase: 50 U/L (ref 38–126)
Anion gap: 13 (ref 5–15)
BUN: 67 mg/dL — ABNORMAL HIGH (ref 8–23)
CO2: 18 mmol/L — ABNORMAL LOW (ref 22–32)
Calcium: 5.8 mg/dL — CL (ref 8.9–10.3)
Chloride: 107 mmol/L (ref 98–111)
Creatinine, Ser: 2.47 mg/dL — ABNORMAL HIGH (ref 0.44–1.00)
GFR, Estimated: 19 mL/min — ABNORMAL LOW (ref >60)
Glucose, Bld: 121 mg/dL — ABNORMAL HIGH (ref 70–99)
Potassium: 4.3 mmol/L (ref 3.5–5.1)
Sodium: 138 mmol/L (ref 135–145)
Total Bilirubin: 0.8 mg/dL (ref 0.3–1.2)
Total Protein: 7.8 g/dL (ref 6.5–8.1)

## 2022-06-08 LAB — RESP PANEL BY RT-PCR (FLU A&B, COVID) ARPGX2
Influenza A by PCR: NEGATIVE
Influenza B by PCR: NEGATIVE
SARS Coronavirus 2 by RT PCR: POSITIVE — AB

## 2022-06-08 MED ORDER — LIDOCAINE 5 % EX PTCH: 1.0000 | MEDICATED_PATCH | Freq: Two times a day (BID) | CUTANEOUS | 0 refills | Status: DC

## 2022-06-08 MED ORDER — CALCIUM CARBONATE ANTACID 500 MG PO CHEW
400.0000 mg | CHEWABLE_TABLET | Freq: Once | ORAL | Status: AC
  Administered 2022-06-08: 400 mg via ORAL
  Filled 2022-06-08: qty 2

## 2022-06-08 MED ORDER — LIDOCAINE 5 % EX PTCH
1.0000 | MEDICATED_PATCH | CUTANEOUS | Status: DC
  Administered 2022-06-08: 1 via TRANSDERMAL
  Filled 2022-06-08: qty 1

## 2022-06-08 MED ORDER — ACETAMINOPHEN 500 MG PO TABS
1000.0000 mg | ORAL_TABLET | Freq: Once | ORAL | Status: AC
  Administered 2022-06-08: 1000 mg via ORAL
  Filled 2022-06-08: qty 2

## 2022-06-08 NOTE — ED Triage Notes (Signed)
Pt presents to ED with /co of falling off her couch Monday and states she fell on her back and hit her breathing machine and states she has had back pain since and its hard to take a deep breath.    Pt also states she took 2 home COVID results and they were +.

## 2022-06-08 NOTE — ED Notes (Signed)
First Nurse: Pt here c/o SOB. Pt states she was recently dx with covid and prescribed medication. Pt states she has been taking her medication but the muscle pain and the SOB have become too much for her.

## 2022-06-08 NOTE — ED Notes (Signed)
E-signature pad unavailable - Pt verbalized understanding of D/C information - no additional concerns at this time.

## 2022-06-08 NOTE — ED Provider Notes (Signed)
[facility] Provider Note    Event Date/Time   First MD Initiated Contact with Patient 06/08/22 1620     (approximate)   History   Chief Complaint: Back Pain   HPI  [name] is a 81 y.o. female with a history of hypertension, hyperlipidemia, diabetes who comes the ED complaining of right upper back pain after falling off of her couch 3 days ago and hitting her back on her CPAP machine.  Patient has been feeling ill for the last 5 days with cough shortness of breath and fatigue.  She took a home COVID test which was positive.  She called her PCP and was prescribed molnupiravir which she has been taking for the last 3 days.  She has good family support at home.  Husband is able to assist her with all ADLs.  Daughter lives nearby.  Immediate neighbors on both sides also check in on the patient and help.     Physical Exam   Triage Vital Signs: ED Triage Vitals  Enc Vitals Group     BP 06/08/22 1435 (!) 131/55     Pulse Rate 06/08/22 1435 62     Resp 06/08/22 1435 20     Temp 06/08/22 1435 97.8 F (36.6 C)     Temp Source 06/08/22 1435 Oral     SpO2 06/08/22 1435 98 %     Weight 06/08/22 1437 195 lb (88.5 kg)     Height 06/08/22 1437 5' 2"  (1.575 m)     Head Circumference --      Peak Flow --      Pain Score 06/08/22 1444 8     Pain Loc --      Pain Edu? --      Excl. in [place]? --     Most recent vital signs: Vitals:   06/08/22 1730 06/08/22 1854  BP: 131/61 (!) 130/54  Pulse: 62 63  Resp: (!) 21 16  Temp:    SpO2: 94% 93%    General: Awake, no distress.  CV:  Good peripheral perfusion.  Regular rate and rhythm.  Recently surgically created left upper extremity AV fistula healing well, no inflammation or swelling.  Palpable thrill.  Intact perfusion to distal left arm. Resp:  Normal effort.  Clear to auscultation bilaterally, no wheezing.  No tachypnea. Abd:  No distention.  Soft and nontender Other:  No head trauma.  No midline spinal  tenderness.  There is tenderness in the left posterior thoracic chest wall along the rhomboids and posterior ribs.  No crepitus step-off or bony point tenderness.  No instability.   ED Results / Procedures / Treatments   Labs (all labs ordered are listed, but only abnormal results are displayed) Labs Reviewed  RESP PANEL BY RT-PCR (FLU A&B, COVID) ARPGX2 - Abnormal; Notable for the following components:      Result Value   SARS Coronavirus 2 by RT PCR POSITIVE (*)    All other components within normal limits  CBC WITH DIFFERENTIAL/PLATELET - Abnormal; Notable for the following components:   Hemoglobin 11.1 (*)    HCT 35.3 (*)    All other components within normal limits  COMPREHENSIVE METABOLIC PANEL - Abnormal; Notable for the following components:   CO2 18 (*)    Glucose, Bld 121 (*)    BUN 67 (*)    Creatinine, Ser 2.47 (*)    Calcium 5.8 (*)    GFR, Estimated 19 (*)    All  other components within normal limits     EKG    RADIOLOGY Chest x-ray interpreted by me, negative for pneumothorax pleural effusion or pneumonia.  Osseous structures unremarkable.  Radiology report reviewed.   PROCEDURES:  Procedures   MEDICATIONS ORDERED IN ED: Medications  lidocaine (LIDODERM) 5 % 1 patch (1 patch Transdermal Patch Applied 06/08/22 1814)  acetaminophen (TYLENOL) tablet 1,000 mg (1,000 mg Oral Given 06/08/22 1812)  calcium carbonate (TUMS - dosed in mg elemental calcium) chewable tablet 400 mg of elemental calcium (400 mg of elemental calcium Oral Given 06/08/22 1817)     IMPRESSION / MDM / ASSESSMENT AND PLAN / ED COURSE  I reviewed the triage vital signs and the nursing notes.                              Differential diagnosis includes, but is not limited to, viral syndrome secondary to COVID, electrolyte abnormality, AKI, anemia, pneumonia, dehydration, contusion  Patient's presentation is most consistent with acute presentation with potential threat to life or bodily  function.  Patient presents with chest wall pain after a fall which occurred 3 days ago.  No midline spinal tenderness, doubt vertebral injury.   Considering the patient's symptoms, medical history, and physical examination today, I have low suspicion for ACS, PE, TAD, pneumothorax, carditis, mediastinitis, pneumonia, CHF, or sepsis.  Labs show stable CKD.  Baseline anemia.  She does have hypocalcemia of 5.8 compared to a baseline of 8.0.  She is on a calcium supplement which I will have her double for the next week while working through this acute illness and medication change.  She is already on antiviral therapy.  She feels comfortable with continued outpatient management at home.  Return precautions discussed.  Not requiring admission and stable for discharge home.       FINAL CLINICAL IMPRESSION(S) / ED DIAGNOSES   Final diagnoses:  Contusion of thoracic wall, initial encounter  COVID-19 virus infection     Rx / DC Orders   ED Discharge Orders          Ordered    lidocaine (LIDODERM) 5 %  Every 12 hours        06/08/22 1911             Note:  This document was prepared using Dragon voice recognition software and may include unintentional dictation errors.   Carrie Mew, MD 06/08/22 [phone]

## 2022-06-10 ENCOUNTER — Other Ambulatory Visit: Payer: Self-pay

## 2022-06-10 ENCOUNTER — Emergency Department: Payer: Medicare Other

## 2022-06-10 ENCOUNTER — Inpatient Hospital Stay: Payer: Medicare Other

## 2022-06-10 ENCOUNTER — Inpatient Hospital Stay
Admission: EM | Admit: 2022-06-10 | Discharge: 2022-06-21 | DRG: 177 | Disposition: A | Discharge status: Home Health | Payer: Medicare Other | Attending: Internal Medicine | Admitting: Internal Medicine

## 2022-06-10 ENCOUNTER — Encounter: Payer: Self-pay | Admitting: Emergency Medicine

## 2022-06-10 DIAGNOSIS — Z8249 Family history of ischemic heart disease and other diseases of the circulatory system: Secondary | ICD-10-CM

## 2022-06-10 DIAGNOSIS — D631 Anemia in chronic kidney disease: Secondary | ICD-10-CM | POA: Diagnosis present

## 2022-06-10 DIAGNOSIS — J9601 Acute respiratory failure with hypoxia: Secondary | ICD-10-CM | POA: Diagnosis not present

## 2022-06-10 DIAGNOSIS — Z85828 Personal history of other malignant neoplasm of skin: Secondary | ICD-10-CM

## 2022-06-10 DIAGNOSIS — N170 Acute kidney failure with tubular necrosis: Secondary | ICD-10-CM | POA: Diagnosis not present

## 2022-06-10 DIAGNOSIS — Z6835 Body mass index (BMI) 35.0-35.9, adult: Secondary | ICD-10-CM

## 2022-06-10 DIAGNOSIS — I130 Hypertensive heart and chronic kidney disease with heart failure and stage 1 through stage 4 chronic kidney disease, or unspecified chronic kidney disease: Secondary | ICD-10-CM | POA: Diagnosis present

## 2022-06-10 DIAGNOSIS — N179 Acute kidney failure, unspecified: Secondary | ICD-10-CM

## 2022-06-10 DIAGNOSIS — J690 Pneumonitis due to inhalation of food and vomit: Secondary | ICD-10-CM | POA: Diagnosis present

## 2022-06-10 DIAGNOSIS — I10 Essential (primary) hypertension: Secondary | ICD-10-CM | POA: Diagnosis not present

## 2022-06-10 DIAGNOSIS — K219 Gastro-esophageal reflux disease without esophagitis: Secondary | ICD-10-CM | POA: Diagnosis present

## 2022-06-10 DIAGNOSIS — E872 Acidosis, unspecified: Secondary | ICD-10-CM | POA: Diagnosis present

## 2022-06-10 DIAGNOSIS — Z888 Allergy status to other drugs, medicaments and biological substances status: Secondary | ICD-10-CM

## 2022-06-10 DIAGNOSIS — R112 Nausea with vomiting, unspecified: Secondary | ICD-10-CM | POA: Diagnosis not present

## 2022-06-10 DIAGNOSIS — I5033 Acute on chronic diastolic (congestive) heart failure: Secondary | ICD-10-CM | POA: Diagnosis not present

## 2022-06-10 DIAGNOSIS — R197 Diarrhea, unspecified: Secondary | ICD-10-CM

## 2022-06-10 DIAGNOSIS — Z882 Allergy status to sulfonamides status: Secondary | ICD-10-CM

## 2022-06-10 DIAGNOSIS — W08XXXA Fall from other furniture, initial encounter: Secondary | ICD-10-CM | POA: Diagnosis present

## 2022-06-10 DIAGNOSIS — E871 Hypo-osmolality and hyponatremia: Secondary | ICD-10-CM | POA: Diagnosis not present

## 2022-06-10 DIAGNOSIS — M25511 Pain in right shoulder: Secondary | ICD-10-CM | POA: Diagnosis not present

## 2022-06-10 DIAGNOSIS — R531 Weakness: Secondary | ICD-10-CM | POA: Diagnosis not present

## 2022-06-10 DIAGNOSIS — R0789 Other chest pain: Secondary | ICD-10-CM | POA: Diagnosis present

## 2022-06-10 DIAGNOSIS — Z7984 Long term (current) use of oral hypoglycemic drugs: Secondary | ICD-10-CM

## 2022-06-10 DIAGNOSIS — Z803 Family history of malignant neoplasm of breast: Secondary | ICD-10-CM

## 2022-06-10 DIAGNOSIS — Z87898 Personal history of other specified conditions: Secondary | ICD-10-CM

## 2022-06-10 DIAGNOSIS — G4733 Obstructive sleep apnea (adult) (pediatric): Secondary | ICD-10-CM | POA: Diagnosis present

## 2022-06-10 DIAGNOSIS — J45909 Unspecified asthma, uncomplicated: Secondary | ICD-10-CM | POA: Diagnosis present

## 2022-06-10 DIAGNOSIS — E890 Postprocedural hypothyroidism: Secondary | ICD-10-CM | POA: Diagnosis present

## 2022-06-10 DIAGNOSIS — M779 Enthesopathy, unspecified: Secondary | ICD-10-CM | POA: Diagnosis present

## 2022-06-10 DIAGNOSIS — U071 COVID-19: Secondary | ICD-10-CM | POA: Diagnosis present

## 2022-06-10 DIAGNOSIS — E669 Obesity, unspecified: Secondary | ICD-10-CM | POA: Diagnosis present

## 2022-06-10 DIAGNOSIS — Z79899 Other long term (current) drug therapy: Secondary | ICD-10-CM

## 2022-06-10 DIAGNOSIS — E785 Hyperlipidemia, unspecified: Secondary | ICD-10-CM | POA: Diagnosis present

## 2022-06-10 DIAGNOSIS — E876 Hypokalemia: Secondary | ICD-10-CM | POA: Diagnosis present

## 2022-06-10 DIAGNOSIS — A0839 Other viral enteritis: Secondary | ICD-10-CM | POA: Diagnosis present

## 2022-06-10 DIAGNOSIS — E861 Hypovolemia: Secondary | ICD-10-CM | POA: Diagnosis present

## 2022-06-10 DIAGNOSIS — M810 Age-related osteoporosis without current pathological fracture: Secondary | ICD-10-CM | POA: Diagnosis present

## 2022-06-10 DIAGNOSIS — E1122 Type 2 diabetes mellitus with diabetic chronic kidney disease: Secondary | ICD-10-CM | POA: Diagnosis present

## 2022-06-10 DIAGNOSIS — K7581 Nonalcoholic steatohepatitis (NASH): Secondary | ICD-10-CM | POA: Diagnosis present

## 2022-06-10 DIAGNOSIS — N184 Chronic kidney disease, stage 4 (severe): Secondary | ICD-10-CM | POA: Diagnosis present

## 2022-06-10 DIAGNOSIS — J1282 Pneumonia due to coronavirus disease 2019: Secondary | ICD-10-CM | POA: Diagnosis present

## 2022-06-10 DIAGNOSIS — Z7951 Long term (current) use of inhaled steroids: Secondary | ICD-10-CM

## 2022-06-10 DIAGNOSIS — F32A Depression, unspecified: Secondary | ICD-10-CM

## 2022-06-10 DIAGNOSIS — Z79890 Hormone replacement therapy (postmenopausal): Secondary | ICD-10-CM

## 2022-06-10 DIAGNOSIS — Z87442 Personal history of urinary calculi: Secondary | ICD-10-CM

## 2022-06-10 LAB — COMPREHENSIVE METABOLIC PANEL
ALT: 21 U/L (ref 0–44)
AST: 27 U/L (ref 15–41)
Albumin: 4.1 g/dL (ref 3.5–5.0)
Alkaline Phosphatase: 51 U/L (ref 38–126)
Anion gap: 15 (ref 5–15)
BUN: 65 mg/dL — ABNORMAL HIGH (ref 8–23)
CO2: 16 mmol/L — ABNORMAL LOW (ref 22–32)
Calcium: 5.7 mg/dL — CL (ref 8.9–10.3)
Chloride: 108 mmol/L (ref 98–111)
Creatinine, Ser: 2.4 mg/dL — ABNORMAL HIGH (ref 0.44–1.00)
GFR, Estimated: 20 mL/min — ABNORMAL LOW (ref >60)
Glucose, Bld: 125 mg/dL — ABNORMAL HIGH (ref 70–99)
Potassium: 3.8 mmol/L (ref 3.5–5.1)
Sodium: 139 mmol/L (ref 135–145)
Total Bilirubin: 0.7 mg/dL (ref 0.3–1.2)
Total Protein: 7.9 g/dL (ref 6.5–8.1)

## 2022-06-10 LAB — CBC WITH DIFFERENTIAL/PLATELET
Abs Immature Granulocytes: 0.06 10*3/uL (ref 0.00–0.07)
Basophils Absolute: 0 10*3/uL (ref 0.0–0.1)
Basophils Relative: 1 %
Eosinophils Absolute: 0.7 10*3/uL — ABNORMAL HIGH (ref 0.0–0.5)
Eosinophils Relative: 9 %
HCT: 33.7 % — ABNORMAL LOW (ref 36.0–46.0)
Hemoglobin: 10.9 g/dL — ABNORMAL LOW (ref 12.0–15.0)
Immature Granulocytes: 1 %
Lymphocytes Relative: 32 %
Lymphs Abs: 2.7 10*3/uL (ref 0.7–4.0)
MCH: 27.9 pg (ref 26.0–34.0)
MCHC: 32.3 g/dL (ref 30.0–36.0)
MCV: 86.4 fL (ref 80.0–100.0)
Monocytes Absolute: 0.6 10*3/uL (ref 0.1–1.0)
Monocytes Relative: 7 %
Neutro Abs: 4.4 10*3/uL (ref 1.7–7.7)
Neutrophils Relative %: 50 %
Platelets: 196 10*3/uL (ref 150–400)
RBC: 3.9 MIL/uL (ref 3.87–5.11)
RDW: 14.2 % (ref 11.5–15.5)
WBC: 8.5 10*3/uL (ref 4.0–10.5)
nRBC: 0 % (ref 0.0–0.2)

## 2022-06-10 LAB — TROPONIN I (HIGH SENSITIVITY)
Troponin I (High Sensitivity): 20 ng/L — ABNORMAL HIGH (ref <18)
Troponin I (High Sensitivity): 19 ng/L — ABNORMAL HIGH (ref <18)

## 2022-06-10 LAB — LACTATE DEHYDROGENASE: LDH: 218 U/L — ABNORMAL HIGH (ref 98–192)

## 2022-06-10 LAB — BRAIN NATRIURETIC PEPTIDE: B Natriuretic Peptide: 91.4 pg/mL (ref 0.0–100.0)

## 2022-06-10 LAB — FERRITIN: Ferritin: 75 ng/mL (ref 11–307)

## 2022-06-10 LAB — GLUCOSE, CAPILLARY
Glucose-Capillary: 128 mg/dL — ABNORMAL HIGH (ref 70–99)
Glucose-Capillary: 101 mg/dL — ABNORMAL HIGH (ref 70–99)
Glucose-Capillary: 114 mg/dL — ABNORMAL HIGH (ref 70–99)

## 2022-06-10 LAB — D-DIMER, QUANTITATIVE: D-Dimer, Quant: 1.5 ug/mL-FEU — ABNORMAL HIGH (ref 0.00–0.50)

## 2022-06-10 LAB — LACTIC ACID, PLASMA: Lactic Acid, Venous: 1 mmol/L (ref 0.5–1.9)

## 2022-06-10 LAB — PROCALCITONIN: Procalcitonin: <0.1 ng/mL

## 2022-06-10 MED ORDER — NIRMATRELVIR/RITONAVIR (PAXLOVID)TABLET: 3.0000 | ORAL_TABLET | Freq: Two times a day (BID) | ORAL | Status: DC

## 2022-06-10 MED ORDER — SODIUM CHLORIDE 0.9 % IV SOLN
500.0000 mg | INTRAVENOUS | Status: DC
  Administered 2022-06-11 – 2022-06-12 (×2): 500 mg via INTRAVENOUS
  Filled 2022-06-10 (×2): qty 5

## 2022-06-10 MED ORDER — SODIUM CHLORIDE 0.9 % IV BOLUS
1000.0000 mL | Freq: Once | INTRAVENOUS | Status: AC
  Administered 2022-06-10: 1000 mL via INTRAVENOUS

## 2022-06-10 MED ORDER — CALCIUM CARBONATE 1250 (500 CA) MG PO TABS
1.0000 | ORAL_TABLET | Freq: Three times a day (TID) | ORAL | Status: DC
  Administered 2022-06-11 – 2022-06-20 (×27): 1250 mg via ORAL
  Filled 2022-06-10 (×31): qty 1

## 2022-06-10 MED ORDER — HYDROCOD POLI-CHLORPHE POLI ER 10-8 MG/5ML PO SUER
5.0000 mL | Freq: Two times a day (BID) | ORAL | Status: DC | PRN
  Administered 2022-06-14 – 2022-06-15 (×2): 5 mL via ORAL
  Filled 2022-06-10 (×2): qty 5

## 2022-06-10 MED ORDER — SODIUM CHLORIDE 0.9 % IV SOLN
8.0000 mg | Freq: Four times a day (QID) | INTRAVENOUS | Status: DC | PRN
  Administered 2022-06-10 – 2022-06-12 (×7): 8 mg via INTRAVENOUS
  Filled 2022-06-10 (×9): qty 4

## 2022-06-10 MED ORDER — ONDANSETRON HCL 4 MG PO TABS: 4.0000 mg | ORAL_TABLET | Freq: Four times a day (QID) | ORAL | Status: DC | PRN

## 2022-06-10 MED ORDER — ONDANSETRON HCL 4 MG/2ML IJ SOLN
4.0000 mg | Freq: Four times a day (QID) | INTRAMUSCULAR | Status: DC | PRN
  Administered 2022-06-10: 4 mg via INTRAVENOUS
  Filled 2022-06-10: qty 2

## 2022-06-10 MED ORDER — LORATADINE 10 MG PO TABS
10.0000 mg | ORAL_TABLET | Freq: Every day | ORAL | Status: DC
  Administered 2022-06-12 – 2022-06-21 (×10): 10 mg via ORAL
  Filled 2022-06-10 (×13): qty 1

## 2022-06-10 MED ORDER — SODIUM CHLORIDE 0.9 % IV SOLN
3.0000 g | Freq: Two times a day (BID) | INTRAVENOUS | Status: DC
  Administered 2022-06-10 – 2022-06-12 (×5): 3 g via INTRAVENOUS
  Filled 2022-06-10 (×5): qty 8
  Filled 2022-06-10: qty 3

## 2022-06-10 MED ORDER — SPIRONOLACTONE 25 MG PO TABS
25.0000 mg | ORAL_TABLET | Freq: Every day | ORAL | Status: DC
  Administered 2022-06-11 – 2022-06-13 (×3): 25 mg via ORAL
  Filled 2022-06-10 (×5): qty 1

## 2022-06-10 MED ORDER — POLYETHYLENE GLYCOL 3350 17 G PO PACK: 17.0000 g | PACK | Freq: Every day | ORAL | Status: DC | PRN

## 2022-06-10 MED ORDER — HYDRALAZINE HCL 50 MG PO TABS
100.0000 mg | ORAL_TABLET | Freq: Two times a day (BID) | ORAL | Status: DC
  Administered 2022-06-11 – 2022-06-21 (×21): 100 mg via ORAL
  Filled 2022-06-10 (×22): qty 2

## 2022-06-10 MED ORDER — MOLNUPIRAVIR EUA 200MG CAPSULE
4.0000 | ORAL_CAPSULE | Freq: Two times a day (BID) | ORAL | Status: DC
  Filled 2022-06-10: qty 4

## 2022-06-10 MED ORDER — ONDANSETRON HCL 4 MG/2ML IJ SOLN
4.0000 mg | Freq: Once | INTRAMUSCULAR | Status: AC
  Administered 2022-06-10: 4 mg via INTRAVENOUS
  Filled 2022-06-10: qty 2

## 2022-06-10 MED ORDER — POTASSIUM CHLORIDE CRYS ER 20 MEQ PO TBCR
20.0000 meq | EXTENDED_RELEASE_TABLET | Freq: Two times a day (BID) | ORAL | Status: DC
  Administered 2022-06-11 – 2022-06-12 (×3): 20 meq via ORAL
  Filled 2022-06-10 (×4): qty 1

## 2022-06-10 MED ORDER — TORSEMIDE 20 MG PO TABS
10.0000 mg | ORAL_TABLET | Freq: Every day | ORAL | Status: DC
  Administered 2022-06-11 – 2022-06-12 (×2): 20 mg via ORAL
  Filled 2022-06-10 (×2): qty 1

## 2022-06-10 MED ORDER — SERTRALINE HCL 50 MG PO TABS
100.0000 mg | ORAL_TABLET | Freq: Every day | ORAL | Status: DC
  Administered 2022-06-12 – 2022-06-21 (×10): 100 mg via ORAL
  Filled 2022-06-10 (×12): qty 2

## 2022-06-10 MED ORDER — ADULT MULTIVITAMIN W/MINERALS CH
1.0000 | ORAL_TABLET | Freq: Every day | ORAL | Status: DC
  Administered 2022-06-12 – 2022-06-21 (×10): 1 via ORAL
  Filled 2022-06-10 (×12): qty 1

## 2022-06-10 MED ORDER — AZELASTINE HCL 0.1 % NA SOLN
1.0000 | Freq: Two times a day (BID) | NASAL | Status: DC
  Administered 2022-06-13 – 2022-06-21 (×16): 1 via NASAL
  Filled 2022-06-10: qty 30

## 2022-06-10 MED ORDER — CALCIUM GLUCONATE-NACL 1-0.675 GM/50ML-% IV SOLN
1.0000 g | Freq: Once | INTRAVENOUS | Status: AC
  Administered 2022-06-10: 1000 mg via INTRAVENOUS
  Filled 2022-06-10: qty 50

## 2022-06-10 MED ORDER — PROMETHAZINE HCL 25 MG/ML IJ SOLN
12.5000 mg | Freq: Once | INTRAVENOUS | Status: AC | PRN
  Administered 2022-06-10: 12.5 mg via INTRAVENOUS
  Filled 2022-06-10: qty 0.5

## 2022-06-10 MED ORDER — HEPARIN SODIUM (PORCINE) 5000 UNIT/ML IJ SOLN
5000.0000 [IU] | Freq: Three times a day (TID) | INTRAMUSCULAR | Status: DC
  Administered 2022-06-10 – 2022-06-21 (×33): 5000 [IU] via SUBCUTANEOUS
  Filled 2022-06-10 (×33): qty 1

## 2022-06-10 MED ORDER — LEVOTHYROXINE SODIUM 50 MCG PO TABS
150.0000 ug | ORAL_TABLET | Freq: Every day | ORAL | Status: DC
  Administered 2022-06-12 – 2022-06-21 (×10): 150 ug via ORAL
  Filled 2022-06-10 (×11): qty 3

## 2022-06-10 MED ORDER — SODIUM CHLORIDE 0.9 % IV SOLN: 2.0000 g | INTRAVENOUS | Status: DC

## 2022-06-10 MED ORDER — HYDROCODONE-ACETAMINOPHEN 5-325 MG PO TABS
1.0000 | ORAL_TABLET | Freq: Four times a day (QID) | ORAL | Status: DC | PRN
  Administered 2022-06-13 – 2022-06-16 (×6): 1 via ORAL
  Filled 2022-06-10 (×6): qty 1

## 2022-06-10 MED ORDER — ONDANSETRON HCL 4 MG/2ML IJ SOLN
4.0000 mg | Freq: Once | INTRAMUSCULAR | Status: AC
Start: 2022-06-10 — End: 2022-06-10
  Administered 2022-06-10: 4 mg via INTRAVENOUS
  Filled 2022-06-10: qty 2

## 2022-06-10 MED ORDER — PANTOPRAZOLE SODIUM 40 MG PO TBEC
40.0000 mg | DELAYED_RELEASE_TABLET | Freq: Every day | ORAL | Status: DC
  Administered 2022-06-12 – 2022-06-21 (×10): 40 mg via ORAL
  Filled 2022-06-10 (×12): qty 1

## 2022-06-10 MED ORDER — METOPROLOL TARTRATE 50 MG PO TABS
50.0000 mg | ORAL_TABLET | Freq: Two times a day (BID) | ORAL | Status: DC
  Administered 2022-06-11 – 2022-06-21 (×21): 50 mg via ORAL
  Filled 2022-06-10 (×22): qty 1

## 2022-06-10 MED ORDER — LEVOTHYROXINE SODIUM 137 MCG PO TABS
137.0000 ug | ORAL_TABLET | Freq: Every day | ORAL | Status: DC
  Filled 2022-06-10: qty 1

## 2022-06-10 MED ORDER — LIDOCAINE 5 % EX PTCH
1.0000 | MEDICATED_PATCH | Freq: Two times a day (BID) | CUTANEOUS | Status: DC
  Administered 2022-06-10 – 2022-06-21 (×7): 1 via TRANSDERMAL
  Filled 2022-06-10 (×14): qty 1

## 2022-06-10 MED ORDER — ACETAMINOPHEN 325 MG PO TABS
650.0000 mg | ORAL_TABLET | Freq: Four times a day (QID) | ORAL | Status: DC | PRN
  Administered 2022-06-10 – 2022-06-18 (×9): 650 mg via ORAL
  Filled 2022-06-10 (×9): qty 2

## 2022-06-10 MED ORDER — VITAMIN C 500 MG PO TABS
500.0000 mg | ORAL_TABLET | Freq: Every day | ORAL | Status: DC
  Administered 2022-06-12 – 2022-06-21 (×10): 500 mg via ORAL
  Filled 2022-06-10 (×12): qty 1

## 2022-06-10 MED ORDER — ZINC SULFATE 220 (50 ZN) MG PO CAPS
220.0000 mg | ORAL_CAPSULE | Freq: Every day | ORAL | Status: DC
  Administered 2022-06-12 – 2022-06-21 (×10): 220 mg via ORAL
  Filled 2022-06-10 (×12): qty 1

## 2022-06-10 MED ORDER — LOSARTAN POTASSIUM 50 MG PO TABS
100.0000 mg | ORAL_TABLET | Freq: Every day | ORAL | Status: DC
  Administered 2022-06-11 – 2022-06-12 (×2): 100 mg via ORAL
  Filled 2022-06-10 (×3): qty 2

## 2022-06-10 MED ORDER — HYDRALAZINE HCL 20 MG/ML IJ SOLN: 5.0000 mg | INTRAMUSCULAR | Status: DC | PRN

## 2022-06-10 MED ORDER — GUAIFENESIN-DM 100-10 MG/5ML PO SYRP
10.0000 mL | ORAL_SOLUTION | ORAL | Status: DC | PRN
  Administered 2022-06-17 – 2022-06-21 (×5): 10 mL via ORAL
  Filled 2022-06-10 (×5): qty 10

## 2022-06-10 MED ORDER — CALCIUM & VIT D3 BONE HEALTH PO LIQD: Freq: Every day | ORAL | Status: DC

## 2022-06-10 MED ORDER — ALBUTEROL SULFATE HFA 108 (90 BASE) MCG/ACT IN AERS
1.0000 | INHALATION_SPRAY | RESPIRATORY_TRACT | Status: DC | PRN
  Administered 2022-06-10 – 2022-06-20 (×9): 2 via RESPIRATORY_TRACT
  Filled 2022-06-10: qty 6.7

## 2022-06-10 MED ORDER — DICLOFENAC SODIUM 1 % EX GEL
2.0000 g | Freq: Four times a day (QID) | CUTANEOUS | Status: DC
  Administered 2022-06-12 – 2022-06-21 (×37): 2 g via TOPICAL
  Filled 2022-06-10 (×2): qty 100

## 2022-06-10 MED ORDER — LEVOFLOXACIN IN D5W 750 MG/150ML IV SOLN
750.0000 mg | Freq: Once | INTRAVENOUS | Status: AC
  Administered 2022-06-10: 750 mg via INTRAVENOUS
  Filled 2022-06-10: qty 150

## 2022-06-10 MED ORDER — MONTELUKAST SODIUM 10 MG PO TABS
10.0000 mg | ORAL_TABLET | Freq: Every day | ORAL | Status: DC
  Administered 2022-06-12 – 2022-06-20 (×10): 10 mg via ORAL
  Filled 2022-06-10 (×10): qty 1

## 2022-06-10 MED ORDER — AMLODIPINE BESYLATE 5 MG PO TABS
5.0000 mg | ORAL_TABLET | Freq: Every day | ORAL | Status: DC
  Administered 2022-06-11 – 2022-06-21 (×11): 5 mg via ORAL
  Filled 2022-06-10 (×12): qty 1

## 2022-06-10 MED ORDER — ROSUVASTATIN CALCIUM 20 MG PO TABS: 40.0000 mg | ORAL_TABLET | Freq: Every day | ORAL | Status: DC

## 2022-06-10 NOTE — Assessment & Plan Note (Signed)
-   We will obtain an x-ray of the right shoulder as this was secondary to a fall. - Pain management will be provided.

## 2022-06-10 NOTE — H&P (Addendum)
[facility]   PATIENT NAME: [name]    MR#:  [id number]  DATE OF BIRTH:  1940/11/29  DATE OF ADMISSION:  06/10/2022  PRIMARY CARE PHYSICIAN: Kirk Ruths, MD   Patient is coming from: Home  REQUESTING/REFERRING PHYSICIAN: Lurline Hare, MD  CHIEF COMPLAINT:   Chief Complaint  Patient presents with   Shortness of Breath    HISTORY OF PRESENT ILLNESS:  [name] is a 81 y.o. Caucasian female with medical history significant for asthma, CHF, type diabetes mellitus, GERD, hypertension, dyslipidemia, CKD stage IV, hypothyroidism and OSA, who presented to the ER with acute onset of generalized weakness with associated nausea, vomiting and diarrhea.  She has been having associated chest congestion and was recently diagnosed with COVID-19 on Monday.  She was started on p.o. molnupiravir.  She took 1 dose on Tuesday and has remaining 3 doses left.  She has been vaccinated for COVID-19.  No fever or chills.  No dyspnea or wheezing or chest pain.  She denies any bilious vomitus or hematemesis.  No abdominal pain or melena or bright red bleeding per rectum.  No dysuria, oliguria or hematuria or flank pain.  She admits to loss of taste and smell.  She fell and was having right shoulder pain.  ED Course: When she came to the ER BP was 134/49 with respiratory rate of 22 then 32 and otherwise normal vital signs.  Labs revealed CBC revealed a hemoglobin of 10.9 hematocrit 33.7 close to previous levels and CMP was remarkable for hypocalcemia 5.7 compared to 5.82 days ago, BUN of 65 and creatinine of 2.4 comparable to previous levels and CO2 of 16 compared to 18.  High sensitive troponin I was 20 and later 19. EKG as reviewed by me : EKG showed normal sinus rhythm with a rate of 72 with poor R wave progression Imaging: Portable chest ray showed mild patchy opacity in the medial right lung base raising the possibility of mild infection/pneumonia.  The patient was given 1 g of IV calcium  gluconate as well as 4 mg of IV Zofran and 1 L bolus of IV normal saline.  She will be admitted to a medical telemetry bed for further evaluation and management. PAST MEDICAL HISTORY:   Past Medical History:  Diagnosis Date   Actinic keratosis    Albuminuria    Anemia    Arthritis    Asthma    Basal cell carcinoma 04/12/2017   Above right lateral brow. Nodulocystic type. EDC   Cancer ([place])    skin   Cataract cortical, senile    CHF (congestive heart failure) ([place])    Diabetes mellitus without complication (HCC)    GERD (gastroesophageal reflux disease)    Hemorrhoids    History of kidney stones    Hyperlipidemia    Hypertension    Hypothyroidism    Lyme disease    No kidney function    OSA (obstructive sleep apnea)    Osteoporosis    Osteoporosis    Reflux esophagitis    Steatohepatitis    Steatohepatitis     PAST SURGICAL HISTORY:   Past Surgical History:  Procedure Laterality Date   ABDOMINAL HYSTERECTOMY     APPENDECTOMY     AV FISTULA PLACEMENT Left 05/19/2022   Procedure: ARTERIOVENOUS (AV) FISTULA CREATION ( BRACHIAL CEPHALIC );  Surgeon: Katha Cabal, MD;  Location: [facility] ORS;  Service: Vascular;  Laterality: Left;   [place]  [facility]   CARDIAC CATHETERIZATION  08/13/2014   [facility]. no significant CAD, normal LVEDP.    CATARACT EXTRACTION     CHOLECYSTECTOMY     COLONOSCOPY     COLONOSCOPY WITH PROPOFOL N/A 12/07/2016   Procedure: COLONOSCOPY WITH PROPOFOL;  Surgeon: Lollie Sails, MD;  Location: [facility] ENDOSCOPY;  Service: Endoscopy;  Laterality: N/A;   ESOPHAGOGASTRODUODENOSCOPY     ESOPHAGOGASTRODUODENOSCOPY (EGD) WITH PROPOFOL N/A 12/07/2016   Procedure: ESOPHAGOGASTRODUODENOSCOPY (EGD) WITH PROPOFOL;  Surgeon: Lollie Sails, MD;  Location: [facility] ENDOSCOPY;  Service: Endoscopy;  Laterality: N/A;   ESOPHAGOGASTRODUODENOSCOPY (EGD) WITH PROPOFOL N/A 01/07/2018   Procedure: ESOPHAGOGASTRODUODENOSCOPY (EGD) WITH PROPOFOL;  Surgeon:  Lollie Sails, MD;  Location: [facility] ENDOSCOPY;  Service: Endoscopy;  Laterality: N/A;   EYE SURGERY     HEMORRHOID SURGERY     PARTIAL HYSTERECTOMY     THYROIDECTOMY N/A 08/25/2019   Procedure: THYROIDECTOMY EXTRACTION OF SUBTOTAL COMPONENT; PARATHYROID AUTOTRANSPLANT X1;  Surgeon: Fredirick Maudlin, MD;  Location: [facility] ORS;  Service: General;  Laterality: N/A;  With Nerve Monitoring(RLN)   TONSILLECTOMY      SOCIAL HISTORY:   Social History   Tobacco Use   Smoking status: Never   Smokeless tobacco: Never  Substance Use Topics   Alcohol use: No    FAMILY HISTORY:   Family History  Problem Relation Age of Onset   Breast cancer Mother    Heart attack Father     DRUG ALLERGIES:   Allergies  Allergen Reactions   Ace Inhibitors     Other reaction(s): Unknown   Eggs Or Egg-Derived Products Diarrhea   Other     Other reaction(s): Other (See Comments) Eggs   Prednisone     Other reaction(s): Other (See Comments) joint pain   Risedronate     Other reaction(s): Other (See Comments)   Sulfa Antibiotics     Other reaction(s): Other (See Comments)   Sulfasalazine Other (See Comments)    REVIEW OF SYSTEMS:   ROS As per history of present illness. All pertinent systems were reviewed above. Constitutional, HEENT, cardiovascular, respiratory, GI, GU, musculoskeletal, neuro, psychiatric, endocrine, integumentary and hematologic systems were reviewed and are otherwise negative/unremarkable except for positive findings mentioned above in the HPI.   MEDICATIONS AT HOME:   Prior to Admission medications   Medication Sig Start Date End Date Taking? Authorizing Provider  acetaminophen (TYLENOL) 500 MG tablet Take 1,000 mg by mouth every 6 (six) hours as needed (pain).    [provider]  albuterol (PROVENTIL HFA;VENTOLIN HFA) 108 (90 BASE) MCG/ACT inhaler Inhale 1-2 puffs into the lungs every 4 (four) hours as needed for wheezing or shortness of breath.    [provider]  amLODipine (NORVASC) 10 MG tablet Take 10 mg by mouth daily.    [provider]  azelastine (ASTELIN) 0.1 % nasal spray ONE SPRAY INTO BOTH NOSTRILS TWICE DAILY 09/14/20   [provider]  calcium-vitamin D (OSCAL 500/200 D-3) 500-200 MG-UNIT tablet Take 1 tablet by mouth 3 (three) times daily. Patient not taking: Reported on 02/16/2022 08/26/19   Tylene Fantasia, PA-C  diclofenac Sodium (VOLTAREN) 1 % GEL APPLY 2 GRAMS TOPICALLY FOUR TIMES DAILY 01/13/22   [provider]  esomeprazole (NEXIUM) 40 MG packet Take 40 mg by mouth daily before breakfast.    [provider]  fluticasone (FLONASE) 50 MCG/ACT nasal spray Place 1 spray into both nostrils daily as needed for allergies.  Patient not taking: Reported on 02/16/2022 11/13/16  [provider]  Fluticasone-Salmeterol (ADVAIR) 250-50 MCG/DOSE AEPB Inhale 1 puff into the lungs 2 (two) times daily as needed (asthma).     [provider]  hydrALAZINE (APRESOLINE) 100 MG tablet Take 100 mg by mouth 2 (two) times daily.    [provider]  HYDROcodone-acetaminophen (NORCO) 5-325 MG tablet Take 1 tablet by mouth every 6 (six) hours as needed for moderate pain. 05/19/22   Schnier, Dolores Lory, MD  levothyroxine (SYNTHROID) 137 MCG tablet Take 1 tablet (137 mcg total) by mouth daily at 6 (six) AM. 08/27/19   Tylene Fantasia, PA-C  lidocaine (LIDODERM) 5 % Place 1 patch onto the skin every 12 (twelve) hours. Remove & Discard patch within 12 hours or as directed by MD 06/08/22   Carrie Mew, MD  loperamide (IMODIUM) 2 MG capsule Take 2 mg by mouth as needed for diarrhea or loose stools. Patient not taking: Reported on 02/16/2022    [provider]  loratadine (CLARITIN) 10 MG tablet Take 10 mg by mouth daily.    [provider]  losartan (COZAAR) 100 MG tablet Take 100 mg by mouth daily.    [provider]  metoprolol tartrate (LOPRESSOR) 50 MG tablet  Take 50 mg by mouth 2 (two) times daily.  08/27/17   [provider]  montelukast (SINGULAIR) 10 MG tablet Take 10 mg by mouth at bedtime.    [provider]  Multiple Minerals-Vitamins (CALCIUM & VIT D3 BONE HEALTH PO) Take 1 tablet by mouth daily. 600 mg/ 25 mg    [provider]  potassium chloride SA (K-DUR,KLOR-CON) 20 MEQ tablet Take 20 mEq by mouth 2 (two) times daily.    [provider]  rosuvastatin (CRESTOR) 40 MG tablet Take 40 mg by mouth daily.    [provider]  sertraline (ZOLOFT) 100 MG tablet Take 100 mg by mouth daily.    [provider]  spironolactone (ALDACTONE) 25 MG tablet Take 1 tablet by mouth daily. 12/27/20   [provider]  torsemide (DEMADEX) 10 MG tablet Take 10-20 mg by mouth daily.  03/26/17   [provider]      VITAL SIGNS:  Blood pressure (!) 147/59, pulse 72, temperature 98 F (36.7 C), resp. rate (!) 37, height 5' 2"  (1.575 m), weight 88.5 kg, SpO2 91 %.  PHYSICAL EXAMINATION:  Physical Exam  GENERAL:  81 y.o.-year-old Caucasian female patient lying in the bed with no acute distress.  EYES: Pupils equal, round, reactive to light and accommodation. No scleral icterus. Extraocular muscles intact.  HEENT: Head atraumatic, normocephalic. Oropharynx and nasopharynx clear.  NECK:  Supple, no jugular venous distention. No thyroid enlargement, no tenderness.  LUNGS: Slightly diminished right basal breath sounds with mild crackles.  No use of accessory muscles of respiration.  CARDIOVASCULAR: Regular rate and rhythm, S1, S2 normal. No murmurs, rubs, or gallops.  ABDOMEN: Soft, nondistended, nontender. Bowel sounds present. No organomegaly or mass.  EXTREMITIES: No pedal edema, cyanosis, or clubbing.  NEUROLOGIC: Cranial nerves II through XII are intact. Muscle strength 5/5 in all extremities. Sensation intact. Gait not checked.  PSYCHIATRIC: The patient is alert and oriented x 3.  Normal  affect and good eye contact. SKIN: No obvious rash, lesion, or ulcer.   LABORATORY PANEL:   CBC Recent Labs  Lab 06/10/22 0204  WBC 8.5  HGB 10.9*  HCT 33.7*  PLT 196   ------------------------------------------------------------------------------------------------------------------  Chemistries  Recent Labs  Lab 06/10/22 0204  NA 139  K 3.8  CL 108  CO2 16*  GLUCOSE 125*  BUN 65*  CREATININE 2.40*  CALCIUM 5.7*  AST 27  ALT 21  ALKPHOS 51  BILITOT 0.7   ------------------------------------------------------------------------------------------------------------------  Cardiac Enzymes No results for input(s): "TROPONINI" in the last 168 hours. ------------------------------------------------------------------------------------------------------------------  RADIOLOGY:  CT ABDOMEN PELVIS WO CONTRAST  Result Date: 06/10/2022 CLINICAL DATA:  Weakness, nausea/vomiting, COVID EXAM: CT ABDOMEN AND PELVIS WITHOUT CONTRAST TECHNIQUE: Multidetector CT imaging of the abdomen and pelvis was performed following the standard protocol without IV contrast. RADIATION DOSE REDUCTION: This exam was performed according to the departmental dose-optimization program which includes automated exposure control, adjustment of the mA and/or kV according to patient size and/or use of iterative reconstruction technique. COMPARISON:  12/29/2016 FINDINGS: Lower chest: Lung bases are clear. Hepatobiliary: Unenhanced liver is unremarkable. Status post cholecystectomy. No intrahepatic or extrahepatic ductal dilatation. Pancreas: Within normal limits. Spleen: Within normal limits. Adrenals/Urinary Tract: Adrenal glands are within normal limits. Right renal cysts, including a dominant 6.3 cm cyst in the right lower kidney (series 2/image 27), measuring simple fluid density, benign (Bosniak I). Left kidney is within normal limits. No hydronephrosis. Bladder is within normal limits. Stomach/Bowel: Stomach is  within normal limits. No evidence of bowel obstruction. Appendix is not discretely visualized. Sigmoid diverticulosis, without evidence of diverticulitis. Vascular/Lymphatic: No evidence of abdominal aortic aneurysm. Atherosclerotic calcifications of the abdominal aorta and branch vessels. No suspicious abdominopelvic lymphadenopathy. Reproductive: Status post hysterectomy. Bilateral ovaries are within normal limits. Other: No abdominopelvic ascites. Musculoskeletal: Mild degenerative changes of the visualized thoracolumbar spine. IMPRESSION: Sigmoid diverticulosis, without evidence of diverticulitis. Additional ancillary findings as above Electronically Signed   By: Julian Hy M.D.   On: 06/10/2022 02:57   DG Chest Port 1 View  Result Date: 06/10/2022 CLINICAL DATA:  Shortness of breath EXAM: PORTABLE CHEST 1 VIEW COMPARISON:  06/08/2022 FINDINGS: Mild patchy opacity at the medial right lung base. Left lung is clear. No pleural effusion or pneumothorax. Cardiomegaly.  Thoracic aortic atherosclerosis. Visualized osseous structures are within normal limits. IMPRESSION: Mild patchy opacity at the medial right lung base, raising the possibility of mild infection/pneumonia. Electronically Signed   By: Julian Hy M.D.   On: 06/10/2022 02:36   DG Chest 2 View  Result Date: 06/08/2022 CLINICAL DATA:  Shortness of breath EXAM: CHEST - 2 VIEW COMPARISON:  Previous studies including the examination of 06/20/2012 FINDINGS: Transverse diameter of heart is increased. There are no signs of pulmonary edema or new focal infiltrates. There is no significant pleural effusion or pneumothorax. IMPRESSION: Cardiomegaly. There are no signs of pulmonary edema or focal pulmonary consolidation. Electronically Signed   By: Elmer Picker M.D.   On: 06/08/2022 15:47      IMPRESSION AND PLAN:  Assessment and Plan: * Pneumonia due to COVID-19 virus - Differential diagnoses would include aspiration pneumonia. -  The patient has associated persistent COVID manifestation of nausea, vomiting and diarrhea as well as generalized weakness. - The patient will be admitted to a medical telemetry bed. - We will continue antiviral therapy with p.o. Paxlovid will be adjusted per renal dose. - We will place on IV antibiotic therapy with Unasyn and Zithromax. - Mucolytic therapy will be provided as well as bronchodilator therapy. - She will be placed on vitamin C, vitamin D3 and zinc sulfate. - We will follow inflammatory markers. - She will be hydrated with IV normal saline.  Hypocalcemia -Calcium was replaced with IV calcium gluconate we will continue with calcium carbonate. -  Calcium level will be followed with BMP.  Right shoulder pain - We will obtain an x-ray of the right shoulder as this was secondary to a fall. - Pain management will be provided.  Depression - We will continue Zoloft  Dyslipidemia - We will hold off statin therapy while she is on Paxlovid.  CKD (chronic kidney disease), stage IV ([place]) - The patient had vascular access but has not had hemodialysis yet.  Essential hypertension - We will continue her antihypertensives.   DVT prophylaxis: Subcu heparin. Advanced Care Planning:  Code Status: full code.  Family Communication:  The plan of care was discussed in details with the patient (and family). I answered all questions. The patient agreed to proceed with the above mentioned plan. Further management will depend upon hospital course. Disposition Plan: Back to previous home environment Consults called: none. All the records are reviewed and case discussed with ED provider.  Status is: Inpatient    At the time of the admission, it appears that the appropriate admission status for this patient is inpatient.  This is judged to be reasonable and necessary in order to provide the required intensity of service to ensure the patient's safety given the presenting symptoms, physical  exam findings and initial radiographic and laboratory data in the context of comorbid conditions.  The patient requires inpatient status due to high intensity of service, high risk of further deterioration and high frequency of surveillance required.  I certify that at the time of admission, it is my clinical judgment that the patient will require inpatient hospital care extending more than 2 midnights.                            Dispo: The patient is from: Home              Anticipated d/c is to: Home              Patient currently is not medically stable to d/c.              Difficult to place patient: No  Christel Mormon M.D on 06/10/2022 at 7:00 AM  [facility]   From 7 PM-7 AM, contact night-coverage [email or web address]  CC: Primary care physician; Kirk Ruths, MD

## 2022-06-10 NOTE — Assessment & Plan Note (Signed)
-   We will hold off statin therapy while she is on Paxlovid.

## 2022-06-10 NOTE — Assessment & Plan Note (Signed)
-   We will continue her antihypertensives.

## 2022-06-10 NOTE — ED Provider Notes (Signed)
[facility] Provider Note    Event Date/Time   First MD Initiated Contact with Patient 06/10/22 0153     (approximate)   History   Shortness of Breath   HPI  [name] is a 81 y.o. female who presents to the ED from home with a chief complaint of generalized weakness, nausea/vomiting/diarrhea, congestion.  Patient diagnosed with COVID-19 on Monday, currently on antiviral.  She is vaccinated against COVID-19.  Denies fever, chest pain, shortness of breath, abdominal pain or dysuria.     Past Medical History   Past Medical History:  Diagnosis Date   Actinic keratosis    Albuminuria    Anemia    Arthritis    Asthma    Basal cell carcinoma 04/12/2017   Above right lateral brow. Nodulocystic type. EDC   Cancer ([place])    skin   Cataract cortical, senile    CHF (congestive heart failure) ([place])    Diabetes mellitus without complication ([place])    GERD (gastroesophageal reflux disease)    Hemorrhoids    History of kidney stones    Hyperlipidemia    Hypertension    Hypothyroidism    Lyme disease    No kidney function    OSA (obstructive sleep apnea)    Osteoporosis    Osteoporosis    Reflux esophagitis    Steatohepatitis    Steatohepatitis      Active Problem List   Patient Active Problem List   Diagnosis Date Noted   Pneumonia due to COVID-19 virus 06/10/2022   Hypothyroidism, acquired 09/01/2019   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/25/2019   Multinodular goiter 07/31/2019   Class 2 severe obesity with serious comorbidity in adult ([place]) 12/12/2018   Diabetes mellitus type 2 in obese ([place]) 09/04/2017   Bilateral lower extremity edema 09/04/2017   Gastroesophageal reflux disease without esophagitis 05/16/2017   Diarrhea 12/29/2016   Enteritis 12/29/2016   Acute lower UTI 12/29/2016   Incomplete emptying of bladder 11/01/2016   Urge incontinence 07/14/2015   Chronic renal failure, stage 3 (moderate) ([place]) 08/06/2014   Renal mass, right  08/06/2014   Precordial pain 07/24/2014   Dyspnea 07/24/2014   Palpitations 07/24/2014   Essential hypertension 07/24/2014   Hyperlipidemia 07/24/2014   Bladder neoplasm of uncertain malignant potential 07/16/2014   Gross hematuria 07/16/2014   Asthma 05/17/2014   Osteoporosis 05/17/2014   Steatohepatitis 05/17/2014   OSA on CPAP 05/17/2014     Past Surgical History   Past Surgical History:  Procedure Laterality Date   ABDOMINAL HYSTERECTOMY     APPENDECTOMY     AV FISTULA PLACEMENT Left 05/19/2022   Procedure: ARTERIOVENOUS (AV) FISTULA CREATION ( BRACHIAL CEPHALIC );  Surgeon: Katha Cabal, MD;  Location: [facility] ORS;  Service: Vascular;  Laterality: Left;   [place]  08/13/2014   [facility]. no significant CAD, normal LVEDP.    CATARACT EXTRACTION     CHOLECYSTECTOMY     COLONOSCOPY     COLONOSCOPY WITH PROPOFOL N/A 12/07/2016   Procedure: COLONOSCOPY WITH PROPOFOL;  Surgeon: Lollie Sails, MD;  Location: [facility] ENDOSCOPY;  Service: Endoscopy;  Laterality: N/A;   ESOPHAGOGASTRODUODENOSCOPY     ESOPHAGOGASTRODUODENOSCOPY (EGD) WITH PROPOFOL N/A 12/07/2016   Procedure: ESOPHAGOGASTRODUODENOSCOPY (EGD) WITH PROPOFOL;  Surgeon: Lollie Sails, MD;  Location: [facility] ENDOSCOPY;  Service: Endoscopy;  Laterality: N/A;   ESOPHAGOGASTRODUODENOSCOPY (EGD) WITH PROPOFOL N/A 01/07/2018   Procedure: ESOPHAGOGASTRODUODENOSCOPY (EGD) WITH PROPOFOL;  Surgeon: Lollie Sails, MD;  Location: [facility] ENDOSCOPY;  Service: Endoscopy;  Laterality: N/A;   EYE SURGERY     HEMORRHOID SURGERY     PARTIAL HYSTERECTOMY     THYROIDECTOMY N/A 08/25/2019   Procedure: THYROIDECTOMY EXTRACTION OF SUBTOTAL COMPONENT; PARATHYROID AUTOTRANSPLANT X1;  Surgeon: Fredirick Maudlin, MD;  Location: [facility] ORS;  Service: General;  Laterality: N/A;  With Nerve Monitoring(RLN)   TONSILLECTOMY       Home Medications   Prior to Admission medications   Medication Sig  Start Date End Date Taking? Authorizing Provider  acetaminophen (TYLENOL) 500 MG tablet Take 1,000 mg by mouth every 6 (six) hours as needed (pain).    [provider]  albuterol (PROVENTIL HFA;VENTOLIN HFA) 108 (90 BASE) MCG/ACT inhaler Inhale 1-2 puffs into the lungs every 4 (four) hours as needed for wheezing or shortness of breath.    [provider]  amLODipine (NORVASC) 10 MG tablet Take 10 mg by mouth daily.    [provider]  azelastine (ASTELIN) 0.1 % nasal spray ONE SPRAY INTO BOTH NOSTRILS TWICE DAILY 09/14/20   [provider]  calcium-vitamin D (OSCAL 500/200 D-3) 500-200 MG-UNIT tablet Take 1 tablet by mouth 3 (three) times daily. Patient not taking: Reported on 02/16/2022 08/26/19   Tylene Fantasia, PA-C  diclofenac Sodium (VOLTAREN) 1 % GEL APPLY 2 GRAMS TOPICALLY FOUR TIMES DAILY 01/13/22   [provider]  esomeprazole (NEXIUM) 40 MG packet Take 40 mg by mouth daily before breakfast.    [provider]  fluticasone (FLONASE) 50 MCG/ACT nasal spray Place 1 spray into both nostrils daily as needed for allergies.  Patient not taking: Reported on 02/16/2022 11/13/16   [provider]  Fluticasone-Salmeterol (ADVAIR) 250-50 MCG/DOSE AEPB Inhale 1 puff into the lungs 2 (two) times daily as needed (asthma).     [provider]  hydrALAZINE (APRESOLINE) 100 MG tablet Take 100 mg by mouth 2 (two) times daily.    [provider]  HYDROcodone-acetaminophen (NORCO) 5-325 MG tablet Take 1 tablet by mouth every 6 (six) hours as needed for moderate pain. 05/19/22   Schnier, Dolores Lory, MD  levothyroxine (SYNTHROID) 137 MCG tablet Take 1 tablet (137 mcg total) by mouth daily at 6 (six) AM. 08/27/19   Tylene Fantasia, PA-C  lidocaine (LIDODERM) 5 % Place 1 patch onto the skin every 12 (twelve) hours. Remove & Discard patch within 12 hours or as directed by MD 06/08/22   Carrie Mew, MD  loperamide (IMODIUM) 2 MG  capsule Take 2 mg by mouth as needed for diarrhea or loose stools. Patient not taking: Reported on 02/16/2022    [provider]  loratadine (CLARITIN) 10 MG tablet Take 10 mg by mouth daily.    [provider]  losartan (COZAAR) 100 MG tablet Take 100 mg by mouth daily.    [provider]  metoprolol tartrate (LOPRESSOR) 50 MG tablet Take 50 mg by mouth 2 (two) times daily.  08/27/17   [provider]  montelukast (SINGULAIR) 10 MG tablet Take 10 mg by mouth at bedtime.    [provider]  Multiple Minerals-Vitamins (CALCIUM & VIT D3 BONE HEALTH PO) Take 1 tablet by mouth daily. 600 mg/ 25 mg    [provider]  potassium chloride SA (K-DUR,KLOR-CON) 20 MEQ tablet Take 20 mEq by mouth 2 (two) times daily.    [provider]  rosuvastatin (CRESTOR) 40 MG tablet Take 40 mg by mouth daily.  [provider]  sertraline (ZOLOFT) 100 MG tablet Take 100 mg by mouth daily.    [provider]  spironolactone (ALDACTONE) 25 MG tablet Take 1 tablet by mouth daily. 12/27/20   [provider]  torsemide (DEMADEX) 10 MG tablet Take 10-20 mg by mouth daily.  03/26/17   [provider]     Allergies  Ace inhibitors, Eggs or egg-derived products, Other, Prednisone, Risedronate, Sulfa antibiotics, and Sulfasalazine   Family History   Family History  Problem Relation Age of Onset   Breast cancer Mother    Heart attack Father      Physical Exam  Triage Vital Signs: ED Triage Vitals  Enc Vitals Group     BP 06/10/22 0140 (!) 134/49     Pulse Rate 06/10/22 0140 62     Resp 06/10/22 0140 (!) 22     Temp 06/10/22 0140 97.6 F (36.4 C)     Temp Source 06/10/22 0140 Oral     SpO2 06/10/22 0140 96 %     Weight 06/10/22 0141 195 lb (88.5 kg)     Height 06/10/22 0141 5' 2"  (1.575 m)     Head Circumference --      Peak Flow --      Pain Score 06/10/22 0140 0     Pain Loc --      Pain Edu? --      Excl.  in [place]? --     Updated Vital Signs: BP (!) 134/49 (BP Location: Left Arm)   Pulse 62   Temp 97.6 F (36.4 C) (Oral)   Resp (!) 22   Ht 5' 2"  (1.575 m)   Wt 88.5 kg   SpO2 96%   BMI 35.67 kg/m    General: Awake, mild distress.  CV:  RRR.  Good peripheral perfusion.  Resp:  Normal effort.  CTAB. Abd:  Nontender.  No distention.  Other:  Bilateral calves are nonswollen and nontender.   ED Results / Procedures / Treatments  Labs (all labs ordered are listed, but only abnormal results are displayed) Labs Reviewed  CBC WITH DIFFERENTIAL/PLATELET - Abnormal; Notable for the following components:      Result Value   Hemoglobin 10.9 (*)    HCT 33.7 (*)    Eosinophils Absolute 0.7 (*)    All other components within normal limits  COMPREHENSIVE METABOLIC PANEL - Abnormal; Notable for the following components:   CO2 16 (*)    Glucose, Bld 125 (*)    BUN 65 (*)    Creatinine, Ser 2.40 (*)    Calcium 5.7 (*)    GFR, Estimated 20 (*)    All other components within normal limits  TROPONIN I (HIGH SENSITIVITY) - Abnormal; Notable for the following components:   Troponin I (High Sensitivity) 20 (*)    All other components within normal limits  CULTURE, BLOOD (ROUTINE X 2)  CULTURE, BLOOD (ROUTINE X 2)  URINE CULTURE  C DIFFICILE QUICK SCREEN W PCR REFLEX    GASTROINTESTINAL PANEL BY PCR, STOOL (REPLACES STOOL CULTURE)  BRAIN NATRIURETIC PEPTIDE  LACTIC ACID, PLASMA  PROCALCITONIN  URINALYSIS, ROUTINE W REFLEX MICROSCOPIC  TROPONIN I (HIGH SENSITIVITY)     EKG  ED ECG REPORT I, Deari Sessler J, the attending physician, personally viewed and interpreted this ECG.   Date: 06/10/2022  EKG Time: 0226  Rate: 64  Rhythm: normal sinus rhythm  Axis: Normal  Intervals:none  ST&T Change: Nonspecific    RADIOLOGY I have independently  visualized and interpreted patient's chest x-ray and CT abdomen/pelvis as well as noted the radiology interpretation:  Chest x-ray: Right lung  patchy opacity  CT abdomen/pelvis: Sigmoid diverticulosis without diverticulitis  Official radiology report(s): CT ABDOMEN PELVIS WO CONTRAST  Result Date: 06/10/2022 CLINICAL DATA:  Weakness, nausea/vomiting, COVID EXAM: CT ABDOMEN AND PELVIS WITHOUT CONTRAST TECHNIQUE: Multidetector CT imaging of the abdomen and pelvis was performed following the standard protocol without IV contrast. RADIATION DOSE REDUCTION: This exam was performed according to the departmental dose-optimization program which includes automated exposure control, adjustment of the mA and/or kV according to patient size and/or use of iterative reconstruction technique. COMPARISON:  12/29/2016 FINDINGS: Lower chest: Lung bases are clear. Hepatobiliary: Unenhanced liver is unremarkable. Status post cholecystectomy. No intrahepatic or extrahepatic ductal dilatation. Pancreas: Within normal limits. Spleen: Within normal limits. Adrenals/Urinary Tract: Adrenal glands are within normal limits. Right renal cysts, including a dominant 6.3 cm cyst in the right lower kidney (series 2/image 27), measuring simple fluid density, benign (Bosniak I). Left kidney is within normal limits. No hydronephrosis. Bladder is within normal limits. Stomach/Bowel: Stomach is within normal limits. No evidence of bowel obstruction. Appendix is not discretely visualized. Sigmoid diverticulosis, without evidence of diverticulitis. Vascular/Lymphatic: No evidence of abdominal aortic aneurysm. Atherosclerotic calcifications of the abdominal aorta and branch vessels. No suspicious abdominopelvic lymphadenopathy. Reproductive: Status post hysterectomy. Bilateral ovaries are within normal limits. Other: No abdominopelvic ascites. Musculoskeletal: Mild degenerative changes of the visualized thoracolumbar spine. IMPRESSION: Sigmoid diverticulosis, without evidence of diverticulitis. Additional ancillary findings as above Electronically Signed   By: Julian Hy M.D.   On:  06/10/2022 02:57   DG Chest Port 1 View  Result Date: 06/10/2022 CLINICAL DATA:  Shortness of breath EXAM: PORTABLE CHEST 1 VIEW COMPARISON:  06/08/2022 FINDINGS: Mild patchy opacity at the medial right lung base. Left lung is clear. No pleural effusion or pneumothorax. Cardiomegaly.  Thoracic aortic atherosclerosis. Visualized osseous structures are within normal limits. IMPRESSION: Mild patchy opacity at the medial right lung base, raising the possibility of mild infection/pneumonia. Electronically Signed   By: Julian Hy M.D.   On: 06/10/2022 02:36     PROCEDURES:  Critical Care performed: Yes, see critical care procedure note(s)  CRITICAL CARE Performed by: Paulette Blanch   Total critical care time: 30 minutes  Critical care time was exclusive of separately billable procedures and treating other patients.  Critical care was necessary to treat or prevent imminent or life-threatening deterioration.  Critical care was time spent personally by me on the following activities: development of treatment plan with patient and/or surrogate as well as nursing, discussions with consultants, evaluation of patient's response to treatment, examination of patient, obtaining history from patient or surrogate, ordering and performing treatments and interventions, ordering and review of laboratory studies, ordering and review of radiographic studies, pulse oximetry and re-evaluation of patient's condition.   Marland Kitchen1-3 Lead EKG Interpretation  Performed by: Paulette Blanch, MD Authorized by: Paulette Blanch, MD     Interpretation: normal     ECG rate:  60   ECG rate assessment: normal     Rhythm: sinus rhythm     Ectopy: none     Conduction: normal   Comments:     Patient placed on cardiac monitor to evaluate for arrhythmias    MEDICATIONS ORDERED IN ED: Medications  calcium gluconate 1 g/ 50 mL sodium chloride IVPB (1,000 mg Intravenous New Bag/Given 06/10/22 0335)  levofloxacin (LEVAQUIN) IVPB 750  mg (has no administration in time  range)  sodium chloride 0.9 % bolus 1,000 mL (0 mLs Intravenous Stopped 06/10/22 0400)  ondansetron (ZOFRAN) injection 4 mg (4 mg Intravenous Given 06/10/22 0220)  ondansetron (ZOFRAN) injection 4 mg (4 mg Intravenous Given 06/10/22 0400)     IMPRESSION / MDM / ASSESSMENT AND PLAN / ED COURSE  I reviewed the triage vital signs and the nursing notes.                             81 year old female presenting with generalized weakness, nausea/vomiting/diarrhea, congestion in the setting of active COVID-19 infection. Differential diagnosis includes, but is not limited to, ovarian cyst, ovarian torsion, acute appendicitis, diverticulitis, urinary tract infection/pyelonephritis, endometriosis, bowel obstruction, colitis, renal colic, gastroenteritis, hernia, etc. I have personally reviewed patient's records and note her ED visit from 06/08/2022 for thoracic wall contusion.  I have also noted a telephone communication with her PCP on 06/06/2022 starting [place].  Patient's presentation is most consistent with acute presentation with potential threat to life or bodily function.  The patient is on the cardiac monitor to evaluate for evidence of arrhythmia and/or significant heart rate changes.  We will obtain sepsis panel, chest x-ray, CT abdomen/pelvis.  Initiate IV fluid hydration, IV Zofran for nausea/vomiting.  Collect stool specimen if patient is able to provide.  Will reassess.  Clinical Course as of 06/10/22 0429  Sat Jun 10, 2022  0340 Laboratory results significant for hypocalcemia 5.7, stable renal insufficiency, mildly elevated troponin.  Chest x-ray concerning for pneumonia; CT negative for acute intra-abdominal etiology.  Administered IV calcium, will administer IV Levaquin.  Will consult hospital services for evaluation and admission. [JS]    Clinical Course User Index [JS] Paulette Blanch, MD     FINAL CLINICAL IMPRESSION(S) / ED DIAGNOSES   Final  diagnoses:  Generalized weakness  COVID-19  Nausea vomiting and diarrhea  Hypocalcemia     Rx / DC Orders   ED Discharge Orders     None        Note:  This document was prepared using Dragon voice recognition software and may include unintentional dictation errors.   Paulette Blanch, MD 06/10/22 [phone]

## 2022-06-10 NOTE — ED Triage Notes (Signed)
Pt presents to ER with generalized weakness, reports she was diagnosed with COVID on Monday, and reports her MD prescribed medication for COVID. Pt reports she continues to feel out of breath, sounds congested. At present reports nausea and vomiting today and also diarrhea.

## 2022-06-10 NOTE — Progress Notes (Signed)
Brief Progress Note (See full H&P from earlier today)   [name] is a 81 y.o. Caucasian female with medical history significant for asthma, CHF, type diabetes mellitus, GERD, hypertension, dyslipidemia, CKD stage IV, hypothyroidism and OSA, who presented to the ER with acute onset of generalized weakness with associated nausea, vomiting and diarrhea.  She has been having associated chest congestion and was recently diagnosed with COVID-19 on Monday.  She was started on p.o. molnupiravir.  She took 1 dose on Tuesday and has remaining 3 doses left.  She has been vaccinated for COVID-19.   ED 09/08-early admission 09/09: Tachypneic with RR 22, 32, VSS otherwise.  Creatinine 2.4, BUN 65, comparable to previous levels.  Hypocalcemia.  Troponin 20, 19.  CXR mild patchy opacity medial right lung base, concern for pneumonia.  Treated with calcium supplementation, Zofran, 1 bolus IV normal saline, 1 dose of levofloxacin.  CT abdomen/pelvis done given nausea/vomiting and GI upset, no acute findings. Admitted to hospitalist service for COVID-19 pneumonia, DDx aspiration pneumonia.  Paxlovid, renally adjusted.  Unasyn/Zithromax for pneumonia. She is unable to keep po down, changed IV meds as able and sterted antiemetic regimen. Holding antivirals for now until can take po    Consultants:  none  Procedures: none      ASSESSMENT & PLAN:   Principal Problem:   Pneumonia due to COVID-19 virus Active Problems:   Hypocalcemia   Right shoulder pain   Essential hypertension   CKD (chronic kidney disease), stage IV (H[facility])   Dyslipidemia   Depression   Pneumonia due to COVID-19 virus Ddx aspiration pneumonia. History of asthma antiviral therapy with p.o. Paxlovid will be adjusted per renal dose. Unasyn and Zithromax. Mucolytic therapy  bronchodilator therapy. vitamin C, vitamin D3 and zinc sulfate. follow inflammatory markers. IV normal saline. Albuterol as needed Continue home  Singulair  Hypocalcemia continue with calcium carbonate. Follow BMP.  CKD (chronic kidney disease), stage IV ([place]) The patient had vascular access but has not had hemodialysis yet. Monitor BMP low threshold for nephrology consult if worsening  Essential hypertension continue her antihypertensives: Amlodipine, losartan, metoprolol, spironolactone, torsemide Hydralaine prn HTN if off po meds   Dyslipidemia hold off statin therapy while she is on Paxlovid.  Depression continue Zoloft  Right shoulder pain secondary to fall XR shoulder personally reviewed, appears intact no fracture or dislocation, pending radiology over read pain management     DVT prophylaxis: Heparin Pertinent IV fluids/nutrition: No continuous IV at this time (no history CHF but echocardiogram 2018 did show grade 1 diastolic dysfunction) Central lines / invasive devices: none  Code Status: FULL Family Communication: none at this time  Disposition: inpatient TOC needs: pending clinical course, may benefit from PT/OT eval  Barriers to discharge / significant pending items: O2 requirement, IV abx for PNA pending clinical improvement and BCx        Subjective: Feeling tired, no SOB at this time this afternoon, just wants to sleep    Objective: Physical Exam:  BP (!) 151/63 (BP Location: Right Arm)   Pulse 73   Temp 97.9 F (36.6 C)   Resp 18   Ht 5' 2"  (1.575 m)   Wt 88.5 kg   SpO2 98%   BMI 35.67 kg/m  Constitutional:  General Appearance: alert, well-developed, well-nourished, NAD Respiratory: Normal respiratory effort Breath sounds normal, no wheeze/rhonchi/rales Cardiovascular: S1/S2 normal, no murmur/rub/gallop auscultated No lower extremity edema Gastrointestinal: Nontender, no masses Musculoskeletal:  No clubbing/cyanosis of digits Neurological: No cranial nerve deficit  on limited exam Psychiatric: Normal judgment/insight Normal mood and affect

## 2022-06-10 NOTE — Assessment & Plan Note (Addendum)
-   Differential diagnoses would include aspiration pneumonia. - The patient has associated persistent COVID manifestation of nausea, vomiting and diarrhea as well as generalized weakness. - The patient will be admitted to a medical telemetry bed. - We will continue antiviral therapy with p.o. Paxlovid will be adjusted per renal dose. - We will place on IV antibiotic therapy with Unasyn and Zithromax. - Mucolytic therapy will be provided as well as bronchodilator therapy. - She will be placed on vitamin C, vitamin D3 and zinc sulfate. - We will follow inflammatory markers. - She will be hydrated with IV normal saline.

## 2022-06-10 NOTE — Assessment & Plan Note (Signed)
-  Calcium was replaced with IV calcium gluconate we will continue with calcium carbonate. - Calcium level will be followed with BMP.

## 2022-06-10 NOTE — Hospital Course (Addendum)
[name] is a 81 y.o. Caucasian female with medical history significant for asthma, CHF, type diabetes mellitus, GERD, hypertension, dyslipidemia, CKD stage IV, hypothyroidism and OSA, who presented to the ER with acute onset of generalized weakness with associated nausea, vomiting and diarrhea.  She has been having associated chest congestion and was recently diagnosed with COVID-19 on Monday.  She was started on p.o. molnupiravir.  She took 1 dose on Tuesday and has remaining 3 doses left.  She has been vaccinated for COVID-19.   ED 09/08-early admission 09/09: Tachypneic with RR 22, 32, VSS otherwise.  Creatinine 2.4, BUN 65, comparable to previous levels.  Hypocalcemia.  Troponin 20, 19.  CXR mild patchy opacity medial right lung base, concern for pneumonia.  Treated with calcium supplementation, Zofran, 1 bolus IV normal saline, 1 dose of levofloxacin.  CT abdomen/pelvis done given nausea/vomiting and GI upset, no acute findings. Admitted to hospitalist service for COVID-19 pneumonia, DDx aspiration pneumonia.  Paxlovid, renally adjusted.  Unasyn/Zithromax for pneumonia. She is unable to keep po down, changed IV meds as able and sterted antiemetic regimen. Holding antivirals for now until can take po  09/10: Still concerned about nausea but she is tolerating p.o.  And still needing oxygen 09/11: Goal wean down O2. BCx NG x2d. Hypocalcemia, hypomagnesemia, hyperphosphatemia and AKI on CKD4 --> consult nephrology  09/12: electrolytes improving but renal function worse.  Continue IV fluid another 24 hours.  9/20.  Patient condition has improved, renal function has back to baseline.  Patient is down 2 L oxygen, currently with good saturation, will obtain home oxygen evaluation.

## 2022-06-10 NOTE — Progress Notes (Signed)
Cpap assembled & taken to room, but not yet initiated. Pt very nauseous & feels like she may vomit.

## 2022-06-10 NOTE — Assessment & Plan Note (Signed)
-   The patient had vascular access but has not had hemodialysis yet.

## 2022-06-10 NOTE — Assessment & Plan Note (Signed)
-   We will continue Zoloft

## 2022-06-11 DIAGNOSIS — U071 COVID-19: Secondary | ICD-10-CM | POA: Diagnosis not present

## 2022-06-11 DIAGNOSIS — J1282 Pneumonia due to coronavirus disease 2019: Secondary | ICD-10-CM | POA: Diagnosis not present

## 2022-06-11 LAB — CBC WITH DIFFERENTIAL/PLATELET
Abs Immature Granulocytes: 0.09 10*3/uL — ABNORMAL HIGH (ref 0.00–0.07)
Basophils Absolute: 0 10*3/uL (ref 0.0–0.1)
Basophils Relative: 0 %
Eosinophils Absolute: 0.2 10*3/uL (ref 0.0–0.5)
Eosinophils Relative: 2 %
HCT: 31.8 % — ABNORMAL LOW (ref 36.0–46.0)
Hemoglobin: 10.4 g/dL — ABNORMAL LOW (ref 12.0–15.0)
Immature Granulocytes: 1 %
Lymphocytes Relative: 22 %
Lymphs Abs: 1.7 10*3/uL (ref 0.7–4.0)
MCH: 28.7 pg (ref 26.0–34.0)
MCHC: 32.7 g/dL (ref 30.0–36.0)
MCV: 87.8 fL (ref 80.0–100.0)
Monocytes Absolute: 0.6 10*3/uL (ref 0.1–1.0)
Monocytes Relative: 9 %
Neutro Abs: 5 10*3/uL (ref 1.7–7.7)
Neutrophils Relative %: 66 %
Platelets: 165 10*3/uL (ref 150–400)
RBC: 3.62 MIL/uL — ABNORMAL LOW (ref 3.87–5.11)
RDW: 14.3 % (ref 11.5–15.5)
WBC: 7.5 10*3/uL (ref 4.0–10.5)
nRBC: 0 % (ref 0.0–0.2)

## 2022-06-11 LAB — COMPREHENSIVE METABOLIC PANEL
ALT: 19 U/L (ref 0–44)
AST: 27 U/L (ref 15–41)
Albumin: 3.7 g/dL (ref 3.5–5.0)
Alkaline Phosphatase: 46 U/L (ref 38–126)
Anion gap: 10 (ref 5–15)
BUN: 52 mg/dL — ABNORMAL HIGH (ref 8–23)
CO2: 18 mmol/L — ABNORMAL LOW (ref 22–32)
Calcium: 5.4 mg/dL — CL (ref 8.9–10.3)
Chloride: 117 mmol/L — ABNORMAL HIGH (ref 98–111)
Creatinine, Ser: 2.23 mg/dL — ABNORMAL HIGH (ref 0.44–1.00)
GFR, Estimated: 22 mL/min — ABNORMAL LOW (ref >60)
Glucose, Bld: 106 mg/dL — ABNORMAL HIGH (ref 70–99)
Potassium: 3.2 mmol/L — ABNORMAL LOW (ref 3.5–5.1)
Sodium: 145 mmol/L (ref 135–145)
Total Bilirubin: 0.7 mg/dL (ref 0.3–1.2)
Total Protein: 7.4 g/dL (ref 6.5–8.1)

## 2022-06-11 LAB — C-REACTIVE PROTEIN: CRP: <0.5 mg/dL (ref <1.0)

## 2022-06-11 LAB — FERRITIN: Ferritin: 79 ng/mL (ref 11–307)

## 2022-06-11 MED ORDER — SCOPOLAMINE 1 MG/3DAYS TD PT72
1.0000 | MEDICATED_PATCH | TRANSDERMAL | Status: DC
Start: 2022-06-11 — End: 2022-06-20
  Administered 2022-06-11 – 2022-06-17 (×3): 1.5 mg via TRANSDERMAL
  Filled 2022-06-11 (×4): qty 1

## 2022-06-11 MED ORDER — PROMETHAZINE HCL 25 MG/ML IJ SOLN
6.2500 mg | Freq: Four times a day (QID) | INTRAMUSCULAR | Status: DC | PRN
  Administered 2022-06-11: 6.25 mg via INTRAVENOUS
  Filled 2022-06-11 (×2): qty 0.25

## 2022-06-11 NOTE — Progress Notes (Signed)
PROGRESS NOTE    MALEEYAH [name]   DDU:[id number] DOB: 10/28/1940  DOA: 06/10/2022 Date of Service: 06/11/22 PCP: Kirk Ruths, MD     Brief Narrative / Hospital Course:  [name] is a 81 y.o. Caucasian female with medical history significant for asthma, CHF, type diabetes mellitus, GERD, hypertension, dyslipidemia, CKD stage IV, hypothyroidism and OSA, who presented to the ER with acute onset of generalized weakness with associated nausea, vomiting and diarrhea.  She has been having associated chest congestion and was recently diagnosed with COVID-19 on Monday.  She was started on p.o. molnupiravir.  She took 1 dose on Tuesday and has remaining 3 doses left.  She has been vaccinated for COVID-19.   ED 09/08-early admission 09/09: Tachypneic with RR 22, 32, VSS otherwise.  Creatinine 2.4, BUN 65, comparable to previous levels.  Hypocalcemia.  Troponin 20, 19.  CXR mild patchy opacity medial right lung base, concern for pneumonia.  Treated with calcium supplementation, Zofran, 1 bolus IV normal saline, 1 dose of levofloxacin.  CT abdomen/pelvis done given nausea/vomiting and GI upset, no acute findings. Admitted to hospitalist service for COVID-19 pneumonia, DDx aspiration pneumonia.  Paxlovid, renally adjusted.  Unasyn/Zithromax for pneumonia. She is unable to keep po down, changed IV meds as able and sterted antiemetic regimen. Holding antivirals for now until can take po  09/10: Still concerned about nausea but she is tolerating p.o.  And still needing oxygen   Consultants:  none  Procedures: none      ASSESSMENT & PLAN:   Principal Problem:   Pneumonia due to COVID-19 virus Active Problems:   Hypocalcemia   Right shoulder pain   Essential hypertension   CKD (chronic kidney disease), stage IV (HCC)   Dyslipidemia   Depression   Pneumonia due to COVID-19 virus Ddx aspiration pneumonia. History of asthma antiviral therapy with p.o. Paxlovid will be adjusted per  renal dose. Unasyn and Zithromax. Mucolytic therapy  bronchodilator therapy. vitamin C, vitamin D3 and zinc sulfate. follow inflammatory markers. IV normal saline. Albuterol as needed Continue home Singulair Plan wean down O2 as tolerated, hopefully will be stable for DC in the next couple of days  Hypocalcemia continue with calcium carbonate. Follow BMP.  CKD (chronic kidney disease), stage IV ([place]) The patient had vascular access but has not had hemodialysis yet. Monitor BMP low threshold for nephrology consult if worsening  Essential hypertension continue her antihypertensives: Amlodipine, losartan, metoprolol, spironolactone, torsemide Hydralaine prn HTN if off po meds   Dyslipidemia hold off statin therapy while she is on Paxlovid.  Depression continue Zoloft  Right shoulder pain secondary to fall XR shoulder personally reviewed, appears intact no fracture or dislocation, pending radiology over read pain management     DVT prophylaxis: Heparin Pertinent IV fluids/nutrition: No continuous IV at this time (no history CHF but echocardiogram 2018 did show grade 1 diastolic dysfunction) Central lines / invasive devices: none  Code Status: FULL Family Communication: none at this time  Disposition: inpatient TOC needs: pending clinical course, may benefit from PT/OT eval  Barriers to discharge / significant pending items: O2 requirement, IV abx for PNA pending clinical improvement and BCx. Plan wean down O2 as tolerated, hopefully will be stable for DC in the next couple of days             Subjective:  Patient reports feeling a bit better today compared to yesterday, tolerating diet.  She states that the Lidoderm patches are probably what has been causing  her nausea. She is amenable to trying scopolamine patch.       Objective:  Vitals:   06/10/22 1728 06/10/22 2206 06/11/22 0250 06/11/22 0832  BP: (!) 142/65 (!) 164/64  (!) 147/62  Pulse: 73 73   75  Resp: 16 20    Temp: 98.1 F (36.7 C) 97.7 F (36.5 C)  (!) 97.3 F (36.3 C)  TempSrc:  Oral    SpO2: 99% 96% 94% 99%  Weight:      Height:        Intake/Output Summary (Last 24 hours) at 06/11/2022 1205 Last data filed at 06/11/2022 0300 Gross per 24 hour  Intake 627.3 ml  Output 650 ml  Net -22.7 ml   Filed Weights   06/10/22 0141  Weight: 88.5 kg    Examination:  Constitutional:  VS as above General Appearance: alert, well-developed, well-nourished, NAD Eyes: Normal lids and conjunctive, non-icteric sclera Ears, Nose, Mouth, Throat: Normal external appearance MMM Neck: No masses, trachea midline Respiratory: Normal respiratory effort No wheeze No rhonchi No rales Cardiovascular: S1/S2 normal No murmur No rub/gallop auscultated No JVD No lower extremity edema Gastrointestinal: No tenderness Musculoskeletal:  Symmetrical movement in all extremities Neurological: No cranial nerve deficit on limited exam Alert Psychiatric: Normal judgment/insight Normal mood and affect       Scheduled Medications:   amLODipine  5 mg Oral Daily   vitamin C  500 mg Oral Daily   [START ON 06/12/2022] azelastine  1 spray Each Nare BID   calcium carbonate  1 tablet Oral TID WC   diclofenac Sodium  2 g Topical QID   heparin  5,000 Units Subcutaneous Q8H   hydrALAZINE  100 mg Oral BID   levothyroxine  150 mcg Oral Q0600   lidocaine  1 patch Transdermal Q12H   loratadine  10 mg Oral Daily   losartan  100 mg Oral Daily   metoprolol tartrate  50 mg Oral BID   montelukast  10 mg Oral QHS   multivitamin with minerals  1 tablet Oral Daily   pantoprazole  40 mg Oral QAC breakfast   potassium chloride SA  20 mEq Oral BID   scopolamine  1 patch Transdermal Q72H   sertraline  100 mg Oral Daily   spironolactone  25 mg Oral Daily   torsemide  10-20 mg Oral Daily   zinc sulfate  220 mg Oral Daily    Continuous Infusions:  ampicillin-sulbactam (UNASYN) IV 3 g  (06/11/22 0932)   azithromycin 500 mg (06/11/22 0529)   ondansetron (ZOFRAN) IV 8 mg (06/11/22 0455)   promethazine (PHENERGAN) injection (IM or IVPB) 6.25 mg (06/11/22 1057)    PRN Medications:  acetaminophen, albuterol, chlorpheniramine-HYDROcodone, guaiFENesin-dextromethorphan, hydrALAZINE, HYDROcodone-acetaminophen, ondansetron (ZOFRAN) IV, polyethylene glycol, promethazine (PHENERGAN) injection (IM or IVPB)  Antimicrobials:  Anti-infectives (From admission, onward)    Start     Dose/Rate Route Frequency Ordered Stop   06/11/22 0600  azithromycin (ZITHROMAX) 500 mg in sodium chloride 0.9 % 250 mL IVPB        500 mg 250 mL/hr over 60 Minutes Intravenous Every 24 hours 06/10/22 0632 06/16/22 0559   06/10/22 1000  nirmatrelvir/ritonavir EUA (PAXLOVID) 3 tablet  Status:  Discontinued        3 tablet Oral 2 times daily 06/10/22 0614 06/10/22 0624   06/10/22 1000  molnupiravir EUA (LAGEVRIO) capsule 800 mg  Status:  Discontinued        4 capsule Oral 2 times daily 06/10/22 0624 06/10/22 4163  06/10/22 1000  molnupiravir EUA (LAGEVRIO) capsule 800 mg  Status:  Discontinued        4 capsule Oral 2 times daily 06/10/22 0650 06/10/22 1322   06/10/22 0800  Ampicillin-Sulbactam (UNASYN) 3 g in sodium chloride 0.9 % 100 mL IVPB        3 g 200 mL/hr over 30 Minutes Intravenous Every 12 hours 06/10/22 0632     06/10/22 0645  cefTRIAXone (ROCEPHIN) 2 g in sodium chloride 0.9 % 100 mL IVPB  Status:  Discontinued        2 g 200 mL/hr over 30 Minutes Intravenous Every 24 hours 06/10/22 0632 06/10/22 0632   06/10/22 0345  levofloxacin (LEVAQUIN) IVPB 750 mg        750 mg 100 mL/hr over 90 Minutes Intravenous  Once 06/10/22 0339 06/10/22 0254       Data Reviewed: I have personally reviewed following labs and imaging studies  CBC: Recent Labs  Lab 06/08/22 1448 06/10/22 0204 06/11/22 0651  WBC 8.2 8.5 7.5  NEUTROABS 4.7 4.4 5.0  HGB 11.1* 10.9* 10.4*  HCT 35.3* 33.7* 31.8*  MCV 89.1  86.4 87.8  PLT 190 196 270   Basic Metabolic Panel: Recent Labs  Lab 06/08/22 1448 06/10/22 0204 06/11/22 0651  NA 138 139 145  K 4.3 3.8 3.2*  CL 107 108 117*  CO2 18* 16* 18*  GLUCOSE 121* 125* 106*  BUN 67* 65* 52*  CREATININE 2.47* 2.40* 2.23*  CALCIUM 5.8* 5.7* 5.4*   GFR: Estimated Creatinine Clearance: 20.5 mL/min (A) (by C-G formula based on SCr of 2.23 mg/dL (H)). Liver Function Tests: Recent Labs  Lab 06/08/22 1448 06/10/22 0204 06/11/22 0651  AST 30 27 27   ALT 23 21 19   ALKPHOS 50 51 46  BILITOT 0.8 0.7 0.7  PROT 7.8 7.9 7.4  ALBUMIN 4.1 4.1 3.7   No results for input(s): "LIPASE", "AMYLASE" in the last 168 hours. No results for input(s): "AMMONIA" in the last 168 hours. Coagulation Profile: No results for input(s): "INR", "PROTIME" in the last 168 hours. Cardiac Enzymes: No results for input(s): "CKTOTAL", "CKMB", "CKMBINDEX", "TROPONINI" in the last 168 hours. BNP (last 3 results) No results for input(s): "PROBNP" in the last 8760 hours. HbA1C: No results for input(s): "HGBA1C" in the last 72 hours. CBG: Recent Labs  Lab 06/10/22 1119 06/10/22 1725 06/10/22 2201  GLUCAP 128* 101* 114*   Lipid Profile: No results for input(s): "CHOL", "HDL", "LDLCALC", "TRIG", "CHOLHDL", "LDLDIRECT" in the last 72 hours. Thyroid Function Tests: No results for input(s): "TSH", "T4TOTAL", "FREET4", "T3FREE", "THYROIDAB" in the last 72 hours. Anemia Panel: Recent Labs    06/10/22 0821 06/11/22 0651  FERRITIN 75 79   Urine analysis:    Component Value Date/Time   COLORURINE YELLOW (A) 12/29/2016 1011   APPEARANCEUR Clear 09/17/2018 1028   LABSPEC 1.036 (H) 12/29/2016 1011   LABSPEC 1.013 06/20/2012 1508   PHURINE 5.0 12/29/2016 1011   GLUCOSEU Negative 09/17/2018 1028   GLUCOSEU Negative 06/20/2012 1508   HGBUR SMALL (A) 12/29/2016 1011   BILIRUBINUR Negative 09/17/2018 1028   BILIRUBINUR Negative 06/20/2012 1508   KETONESUR NEGATIVE 12/29/2016 1011    PROTEINUR 3+ (A) 09/17/2018 1028   PROTEINUR 100 (A) 12/29/2016 1011   NITRITE Negative 09/17/2018 1028   NITRITE NEGATIVE 12/29/2016 1011   LEUKOCYTESUR Trace (A) 09/17/2018 1028   LEUKOCYTESUR Negative 06/20/2012 1508   Sepsis Labs: @LABRCNTIP (procalcitonin:4,lacticidven:4)  Recent Results (from the past 240 hour(s))  Resp Panel by RT-PCR (  Flu A&B, Covid) Anterior Nasal Swab     Status: Abnormal   Collection Time: 06/08/22  2:37 PM   Specimen: Anterior Nasal Swab  Result Value Ref Range Status   SARS Coronavirus 2 by RT PCR POSITIVE (A) NEGATIVE Final    Comment: (NOTE) SARS-CoV-2 target nucleic acids are DETECTED.  The SARS-CoV-2 RNA is generally detectable in upper respiratory specimens during the acute phase of infection. Positive results are indicative of the presence of the identified virus, but do not rule out bacterial infection or co-infection with other pathogens not detected by the test. Clinical correlation with patient history and other diagnostic information is necessary to determine patient infection status. The expected result is Negative.  Fact Sheet for Patients: EntrepreneurPulse.com.au  Fact Sheet for Healthcare Providers: IncredibleEmployment.be  This test is not yet approved or cleared by the [place] FDA and  has been authorized for detection and/or diagnosis of SARS-CoV-2 by FDA under an Emergency Use Authorization (EUA).  This EUA will remain in effect (meaning this test can be used) for the duration of  the COVID-19 declaration under Section 564(b)(1) of the A ct, 21 U.S.C. section 360bbb-3(b)(1), unless the authorization is terminated or revoked sooner.     Influenza A by PCR NEGATIVE NEGATIVE Final   Influenza B by PCR NEGATIVE NEGATIVE Final    Comment: (NOTE) The Xpert Xpress SARS-CoV-2/FLU/RSV plus assay is intended as an aid in the diagnosis of influenza from Nasopharyngeal swab specimens  and should not be used as a sole basis for treatment. Nasal washings and aspirates are unacceptable for Xpert Xpress SARS-CoV-2/FLU/RSV testing.  Fact Sheet for Patients: EntrepreneurPulse.com.au  Fact Sheet for Healthcare Providers: IncredibleEmployment.be  This test is not yet approved or cleared by the [place] FDA and has been authorized for detection and/or diagnosis of SARS-CoV-2 by FDA under an Emergency Use Authorization (EUA). This EUA will remain in effect (meaning this test can be used) for the duration of the COVID-19 declaration under Section 564(b)(1) of the Act, 21 U.S.C. section 360bbb-3(b)(1), unless the authorization is terminated or revoked.  Performed at [facility], [address] [place], [place] [address]   Culture, blood (routine x 2)     Status: None (Preliminary result)   Collection Time: 06/10/22  2:04 AM   Specimen: BLOOD RIGHT ARM  Result Value Ref Range Status   Specimen Description BLOOD RIGHT ARM  Final   Special Requests   Final    BOTTLES DRAWN AEROBIC AND ANAEROBIC Blood Culture adequate volume   Culture   Final    NO GROWTH 1 DAY Performed at [facility], [address] [place], [place] [address]    Report Status PENDING  Incomplete  Culture, blood (routine x 2)     Status: None (Preliminary result)   Collection Time: 06/10/22  2:04 AM   Specimen: BLOOD RIGHT ARM  Result Value Ref Range Status   Specimen Description BLOOD RIGHT ARM  Final   Special Requests   Final    BOTTLES DRAWN AEROBIC AND ANAEROBIC Blood Culture results may not be optimal due to an inadequate volume of blood received in culture bottles   Culture   Final    NO GROWTH 1 DAY Performed at [facility], [address] [place], [place] [address]    Report Status PENDING  Incomplete         Radiology Studies: DG Shoulder Right Port  Result Date: 06/10/2022 CLINICAL DATA:  Recent fall.   Shoulder  pain. EXAM: RIGHT SHOULDER - 1 VIEW COMPARISON:  No comparison studies available. FINDINGS: Bones are demineralized. No fracture or evidence of dislocation. Degenerative changes are seen in the glenohumeral joint. Calcific tendinitis noted at the rotator cuff insertion. IMPRESSION: 1. No acute bony abnormality. 2. Degenerative changes in the glenohumeral joint. 3. Calcific tendinitis. Electronically Signed   By: Misty Stanley M.D.   On: 06/10/2022 07:45   CT ABDOMEN PELVIS WO CONTRAST  Result Date: 06/10/2022 CLINICAL DATA:  Weakness, nausea/vomiting, COVID EXAM: CT ABDOMEN AND PELVIS WITHOUT CONTRAST TECHNIQUE: Multidetector CT imaging of the abdomen and pelvis was performed following the standard protocol without IV contrast. RADIATION DOSE REDUCTION: This exam was performed according to the departmental dose-optimization program which includes automated exposure control, adjustment of the mA and/or kV according to patient size and/or use of iterative reconstruction technique. COMPARISON:  12/29/2016 FINDINGS: Lower chest: Lung bases are clear. Hepatobiliary: Unenhanced liver is unremarkable. Status post cholecystectomy. No intrahepatic or extrahepatic ductal dilatation. Pancreas: Within normal limits. Spleen: Within normal limits. Adrenals/Urinary Tract: Adrenal glands are within normal limits. Right renal cysts, including a dominant 6.3 cm cyst in the right lower kidney (series 2/image 27), measuring simple fluid density, benign (Bosniak I). Left kidney is within normal limits. No hydronephrosis. Bladder is within normal limits. Stomach/Bowel: Stomach is within normal limits. No evidence of bowel obstruction. Appendix is not discretely visualized. Sigmoid diverticulosis, without evidence of diverticulitis. Vascular/Lymphatic: No evidence of abdominal aortic aneurysm. Atherosclerotic calcifications of the abdominal aorta and branch vessels. No suspicious abdominopelvic lymphadenopathy. Reproductive:  Status post hysterectomy. Bilateral ovaries are within normal limits. Other: No abdominopelvic ascites. Musculoskeletal: Mild degenerative changes of the visualized thoracolumbar spine. IMPRESSION: Sigmoid diverticulosis, without evidence of diverticulitis. Additional ancillary findings as above Electronically Signed   By: Julian Hy M.D.   On: 06/10/2022 02:57   DG Chest Port 1 View  Result Date: 06/10/2022 CLINICAL DATA:  Shortness of breath EXAM: PORTABLE CHEST 1 VIEW COMPARISON:  06/08/2022 FINDINGS: Mild patchy opacity at the medial right lung base. Left lung is clear. No pleural effusion or pneumothorax. Cardiomegaly.  Thoracic aortic atherosclerosis. Visualized osseous structures are within normal limits. IMPRESSION: Mild patchy opacity at the medial right lung base, raising the possibility of mild infection/pneumonia. Electronically Signed   By: Julian Hy M.D.   On: 06/10/2022 02:36            LOS: 1 day     Emeterio Reeve, DO [facility] 06/11/2022, 12:05 PM   Staff may message me via secure chat in [place]  but this may not receive immediate response,  please page for urgent matters!  If 7PM-7AM, please contact night-coverage [email or web address]  Dictation software was used to generate the above note. Typos may occur and escape review, as with typed/written notes. Please contact Dr Sheppard Coil directly for clarity if needed.

## 2022-06-11 NOTE — Plan of Care (Signed)
Patient given po tylenol for mild pain and afterwards became nauseas and dry heaving on side of bed. Pt given prn zofran. Pt unable to tolerate any more po meds the rest of the night. Pt woke up from sleep with nausea and heaving. No episodes of emesis overnight. Another dose of prn zofran running.  Problem: Education: Goal: Knowledge of risk factors and measures for prevention of condition will improve Outcome: Progressing   Problem: Coping: Goal: Psychosocial and spiritual needs will be supported Outcome: Progressing   Problem: Respiratory: Goal: Will maintain a patent airway Outcome: Progressing Goal: Complications related to the disease process, condition or treatment will be avoided or minimized Outcome: Progressing   Problem: Education: Goal: Knowledge of General Education information will improve Description: Including pain rating scale, medication(s)/side effects and non-pharmacologic comfort measures Outcome: Progressing   Problem: Health Behavior/Discharge Planning: Goal: Ability to manage health-related needs will improve Outcome: Progressing   Problem: Clinical Measurements: Goal: Ability to maintain clinical measurements within normal limits will improve Outcome: Progressing Goal: Will remain free from infection Outcome: Progressing Goal: Diagnostic test results will improve Outcome: Progressing Goal: Respiratory complications will improve Outcome: Progressing Goal: Cardiovascular complication will be avoided Outcome: Progressing   Problem: Activity: Goal: Risk for activity intolerance will decrease Outcome: Progressing   Problem: Nutrition: Goal: Adequate nutrition will be maintained Outcome: Progressing   Problem: Coping: Goal: Level of anxiety will decrease Outcome: Progressing   Problem: Elimination: Goal: Will not experience complications related to bowel motility Outcome: Progressing Goal: Will not experience complications related to urinary  retention Outcome: Progressing   Problem: Pain Managment: Goal: General experience of comfort will improve Outcome: Progressing   Problem: Safety: Goal: Ability to remain free from injury will improve Outcome: Progressing   Problem: Skin Integrity: Goal: Risk for impaired skin integrity will decrease Outcome: Progressing

## 2022-06-11 NOTE — TOC Initial Note (Signed)
Transition of Care [facility]) - Initial/Assessment Note    Patient Details  Name: [name] MRN: [id number] Date of Birth: [birth year]  [facility] CM/SW Contact:    Magnus Ivan, LCSW Phone Number: 06/11/2022, 12:31 PM  Clinical Narrative:                 Patient on airborne isolation. CSW called into patient's room, daughter Clarene Critchley answered. Completed high readmission risk assessment. Patient lives home with her spouse. Patient and spouse both drive. PCP is Dr. Ouida Sills. [facility]acy is [facility]. Patient has a cane, RW, and BSC at home.  No HH or SNF history. TOC to follow for needs.   Expected Discharge Plan: Home/Self Care Barriers to Discharge: Continued Medical Work up   Patient Goals and CMS Choice Patient states their goals for this hospitalization and ongoing recovery are:: home with spouse CMS Medicare.gov Compare Post Acute Care list provided to:: Patient Represenative (must comment) Choice offered to / list presented to : Adult Children  Expected Discharge Plan and Services Expected Discharge Plan: Home/Self Care       Living arrangements for the past 2 months: Single Family Home                                      Prior Living Arrangements/Services Living arrangements for the past 2 months: Single Family Home Lives with:: Spouse Patient language and need for interpreter reviewed:: Yes Do you feel safe going back to the place where you live?: Yes      Need for Family Participation in Patient Care: Yes (Comment) Care giver support system in place?: Yes (comment) Current home services: DME Criminal Activity/Legal Involvement Pertinent to Current Situation/Hospitalization: No - Comment as needed  Activities of Daily Living      Permission Sought/Granted Permission sought to share information with : Facility Art therapist granted to share information with : Yes, Verbal Permission Granted (by daughter)      Permission granted to share info w AGENCY: as needed        Emotional Assessment         Alcohol / Substance Use: Not Applicable Psych Involvement: No (comment)  Admission diagnosis:  Hypocalcemia [E83.51] Generalized weakness [R53.1] Nausea vomiting and diarrhea [R11.2, R19.7] Pneumonia due to COVID-19 virus [U07.1, J12.82] COVID-19 [U07.1] Patient Active Problem List   Diagnosis Date Noted   Pneumonia due to COVID-19 virus 06/10/2022   Hypocalcemia 06/10/2022   CKD (chronic kidney disease), stage IV ([place]) 06/10/2022   Dyslipidemia 06/10/2022   Depression 06/10/2022   Right shoulder pain 06/10/2022   Generalized weakness    Nausea vomiting and diarrhea    Hypothyroidism, acquired 09/01/2019   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/25/2019   Multinodular goiter 07/31/2019   Class 2 severe obesity with serious comorbidity in adult ([place]) 12/12/2018   Diabetes mellitus type 2 in obese ([place]) 09/04/2017   Bilateral lower extremity edema 09/04/2017   Gastroesophageal reflux disease without esophagitis 05/16/2017   Diarrhea 12/29/2016   Enteritis 12/29/2016   Acute lower UTI 12/29/2016   Incomplete emptying of bladder 11/01/2016   Urge incontinence 07/14/2015   Chronic renal failure, stage 3 (moderate) ([place]) 08/06/2014   Renal mass, right 08/06/2014   Precordial pain 07/24/2014   Dyspnea 07/24/2014   Palpitations 07/24/2014   Essential hypertension 07/24/2014   Hyperlipidemia 07/24/2014   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

## 2022-06-11 NOTE — Plan of Care (Signed)

## 2022-06-12 DIAGNOSIS — U071 COVID-19: Secondary | ICD-10-CM | POA: Diagnosis not present

## 2022-06-12 DIAGNOSIS — N179 Acute kidney failure, unspecified: Secondary | ICD-10-CM

## 2022-06-12 DIAGNOSIS — J1282 Pneumonia due to coronavirus disease 2019: Secondary | ICD-10-CM | POA: Diagnosis not present

## 2022-06-12 LAB — COMPREHENSIVE METABOLIC PANEL
ALT: 18 U/L (ref 0–44)
AST: 30 U/L (ref 15–41)
Albumin: 3.9 g/dL (ref 3.5–5.0)
Alkaline Phosphatase: 47 U/L (ref 38–126)
Anion gap: 13 (ref 5–15)
BUN: 54 mg/dL — ABNORMAL HIGH (ref 8–23)
CO2: 16 mmol/L — ABNORMAL LOW (ref 22–32)
Calcium: 5.5 mg/dL — CL (ref 8.9–10.3)
Chloride: 114 mmol/L — ABNORMAL HIGH (ref 98–111)
Creatinine, Ser: 3.45 mg/dL — ABNORMAL HIGH (ref 0.44–1.00)
GFR, Estimated: 13 mL/min — ABNORMAL LOW (ref >60)
Glucose, Bld: 149 mg/dL — ABNORMAL HIGH (ref 70–99)
Potassium: 3.9 mmol/L (ref 3.5–5.1)
Sodium: 143 mmol/L (ref 135–145)
Total Bilirubin: 0.6 mg/dL (ref 0.3–1.2)
Total Protein: 7.3 g/dL (ref 6.5–8.1)

## 2022-06-12 LAB — CBC WITH DIFFERENTIAL/PLATELET
Abs Immature Granulocytes: 0.06 10*3/uL (ref 0.00–0.07)
Basophils Absolute: 0.1 10*3/uL (ref 0.0–0.1)
Basophils Relative: 1 %
Eosinophils Absolute: 0.2 10*3/uL (ref 0.0–0.5)
Eosinophils Relative: 2 %
HCT: 31.9 % — ABNORMAL LOW (ref 36.0–46.0)
Hemoglobin: 10.4 g/dL — ABNORMAL LOW (ref 12.0–15.0)
Immature Granulocytes: 1 %
Lymphocytes Relative: 22 %
Lymphs Abs: 2.2 10*3/uL (ref 0.7–4.0)
MCH: 28.4 pg (ref 26.0–34.0)
MCHC: 32.6 g/dL (ref 30.0–36.0)
MCV: 87.2 fL (ref 80.0–100.0)
Monocytes Absolute: 1 10*3/uL (ref 0.1–1.0)
Monocytes Relative: 10 %
Neutro Abs: 6.4 10*3/uL (ref 1.7–7.7)
Neutrophils Relative %: 64 %
Platelets: 181 10*3/uL (ref 150–400)
RBC: 3.66 MIL/uL — ABNORMAL LOW (ref 3.87–5.11)
RDW: 14.6 % (ref 11.5–15.5)
WBC: 9.9 10*3/uL (ref 4.0–10.5)
nRBC: 0 % (ref 0.0–0.2)

## 2022-06-12 LAB — BASIC METABOLIC PANEL
Anion gap: 12 (ref 5–15)
BUN: 55 mg/dL — ABNORMAL HIGH (ref 8–23)
CO2: 14 mmol/L — ABNORMAL LOW (ref 22–32)
Calcium: 5.9 mg/dL — CL (ref 8.9–10.3)
Chloride: 116 mmol/L — ABNORMAL HIGH (ref 98–111)
Creatinine, Ser: 4.01 mg/dL — ABNORMAL HIGH (ref 0.44–1.00)
GFR, Estimated: 11 mL/min — ABNORMAL LOW (ref >60)
Glucose, Bld: 93 mg/dL (ref 70–99)
Potassium: 4.1 mmol/L (ref 3.5–5.1)
Sodium: 142 mmol/L (ref 135–145)

## 2022-06-12 LAB — PHOSPHORUS
Phosphorus: 5.6 mg/dL — ABNORMAL HIGH (ref 2.5–4.6)
Phosphorus: 5.8 mg/dL — ABNORMAL HIGH (ref 2.5–4.6)

## 2022-06-12 LAB — MAGNESIUM
Magnesium: <0.5 mg/dL — CL (ref 1.7–2.4)
Magnesium: 1 mg/dL — ABNORMAL LOW (ref 1.7–2.4)

## 2022-06-12 LAB — C-REACTIVE PROTEIN: CRP: 1.2 mg/dL — ABNORMAL HIGH (ref <1.0)

## 2022-06-12 LAB — FERRITIN: Ferritin: 86 ng/mL (ref 11–307)

## 2022-06-12 MED ORDER — MOLNUPIRAVIR EUA 200MG CAPSULE
4.0000 | ORAL_CAPSULE | Freq: Two times a day (BID) | ORAL | Status: AC
  Administered 2022-06-12 – 2022-06-16 (×10): 800 mg via ORAL
  Filled 2022-06-12: qty 4

## 2022-06-12 MED ORDER — SODIUM CHLORIDE 0.9 % IV SOLN
3.0000 g | INTRAVENOUS | Status: DC
  Filled 2022-06-12: qty 8

## 2022-06-12 MED ORDER — LOPERAMIDE HCL 2 MG PO CAPS
4.0000 mg | ORAL_CAPSULE | ORAL | Status: DC | PRN
  Administered 2022-06-12 (×2): 4 mg via ORAL
  Filled 2022-06-12 (×2): qty 2

## 2022-06-12 MED ORDER — LACTATED RINGERS IV SOLN: INTRAVENOUS | Status: DC

## 2022-06-12 MED ORDER — CALCITRIOL 0.25 MCG PO CAPS
0.5000 ug | ORAL_CAPSULE | Freq: Every day | ORAL | Status: DC
  Administered 2022-06-12 – 2022-06-21 (×10): 0.5 ug via ORAL
  Filled 2022-06-12 (×10): qty 2

## 2022-06-12 MED ORDER — CALCIUM GLUCONATE-NACL 2-0.675 GM/100ML-% IV SOLN
2.0000 g | Freq: Once | INTRAVENOUS | Status: DC
  Filled 2022-06-12: qty 100

## 2022-06-12 MED ORDER — CALCIUM GLUCONATE-NACL 1-0.675 GM/50ML-% IV SOLN
1.0000 g | Freq: Once | INTRAVENOUS | Status: AC
  Administered 2022-06-12: 1000 mg via INTRAVENOUS
  Filled 2022-06-12: qty 50

## 2022-06-12 MED ORDER — MAGNESIUM SULFATE 2 GM/50ML IV SOLN
2.0000 g | Freq: Once | INTRAVENOUS | Status: AC
  Administered 2022-06-12: 2 g via INTRAVENOUS
  Filled 2022-06-12: qty 50

## 2022-06-12 MED ORDER — POTASSIUM CHLORIDE CRYS ER 10 MEQ PO TBCR: 10.0000 meq | EXTENDED_RELEASE_TABLET | Freq: Every day | ORAL | Status: DC

## 2022-06-12 MED ORDER — SODIUM CHLORIDE 0.9 % IV SOLN
100.0000 mg | Freq: Two times a day (BID) | INTRAVENOUS | Status: AC
  Administered 2022-06-13 – 2022-06-15 (×6): 100 mg via INTRAVENOUS
  Filled 2022-06-12 (×6): qty 100

## 2022-06-12 NOTE — Plan of Care (Signed)

## 2022-06-12 NOTE — Progress Notes (Signed)
RT present in pt's room to  place pt on bipap as ordered. Pt. States she was going to take a bath. RT asked pt. To notify RT when ready to be placed on bipap.

## 2022-06-12 NOTE — Progress Notes (Signed)
PROGRESS NOTE    [name]   TJQ:[id number] DOB: 08-19-1941  DOA: 06/10/2022 Date of Service: 06/12/22 PCP: Kirk Ruths, MD     Brief Narrative / Hospital Course:  [name] is a 81 y.o. Caucasian female with medical history significant for asthma, CHF, type diabetes mellitus, GERD, hypertension, dyslipidemia, CKD stage IV, hypothyroidism and OSA, who presented to the ER with acute onset of generalized weakness with associated nausea, vomiting and diarrhea.  She has been having associated chest congestion and was recently diagnosed with COVID-19 on Monday.  She was started on p.o. molnupiravir.  She took 1 dose on Tuesday and has remaining 3 doses left.  She has been vaccinated for COVID-19.   ED 09/08-early admission 09/09: Tachypneic with RR 22, 32, VSS otherwise.  Creatinine 2.4, BUN 65, comparable to previous levels.  Hypocalcemia.  Troponin 20, 19.  CXR mild patchy opacity medial right lung base, concern for pneumonia.  Treated with calcium supplementation, Zofran, 1 bolus IV normal saline, 1 dose of levofloxacin.  CT abdomen/pelvis done given nausea/vomiting and GI upset, no acute findings. Admitted to hospitalist service for COVID-19 pneumonia, DDx aspiration pneumonia.  Paxlovid, renally adjusted.  Unasyn/Zithromax for pneumonia. She is unable to keep po down, changed IV meds as able and sterted antiemetic regimen. Holding antivirals for now until can take po  09/10: Still concerned about nausea but she is tolerating p.o.  And still needing oxygen 09/11: PT eval pending, may need HH. Goal wean down O2. BCx NG x2d. Hypocalcemia, hypomagnesemia, hyperphosphatemia and AKI on CKD4 --> consult nephrology    Consultants:  none  Procedures: none      ASSESSMENT & PLAN:   Principal Problem:   Pneumonia due to COVID-19 virus Active Problems:   Hypocalcemia   Right shoulder pain   Essential hypertension   CKD (chronic kidney disease), stage IV (HCC)    Dyslipidemia   Depression   AKI (acute kidney injury) ([place])   Hypomagnesemia   Hyperphosphatemia   Pneumonia due to COVID-19 virus Ddx aspiration pneumonia. History of asthma antiviral therapy with p.o. Paxlovid will be adjusted per renal dose. Unasyn and Zithromax. Mucolytic therapy  bronchodilator therapy. vitamin C, vitamin D3 and zinc sulfate. follow inflammatory markers. IV normal saline. Albuterol as needed Continue home Singulair Plan wean down O2 as tolerated, hopefully will be stable for DC in the next couple of days  Hypocalcemia Hypomagnesemia Hyperphosphatemia  Calcium gluconate in ED, she was continued with calcium carbonate. Follow BMP --> worsening calcium, along w/ hypomag, hyperphos Pharmacy following - appreciate assistance Nephrology consulted   AKI on CKD (chronic kidney disease), stage IV ([place]) The patient had vascular access but has not had hemodialysis yet. Monitor BMP low threshold for nephrology consult if worsening --> nephrology consulted 09/11 given worsening renal and electrolytes as above   Essential hypertension continue her antihypertensives: Amlodipine, losartan, metoprolol, spironolactone, torsemide Hydralaine prn HTN if off po meds   Dyslipidemia hold off statin therapy while she is on Paxlovid.  Depression continue Zoloft  Right shoulder pain secondary to fall XR shoulder personally reviewed, appears intact no fracture or dislocation, pending radiology over read pain management     DVT prophylaxis: Heparin Pertinent IV fluids/nutrition: No continuous IV at this time (no history CHF but echocardiogram 2018 did show grade 1 diastolic dysfunction) Central lines / invasive devices: none  Code Status: FULL Family Communication: none at this time  Disposition: inpatient TOC needs: pending clinical course, may benefit from PT/OT eval  Barriers to discharge / significant pending items: worsening renal fxn, nephrology consulted,  treating electrolyte derangements as above              Subjective:  Patient reports feeling nauseous but this is improved. NO chest pain, shortness of breath. SHe is on room air.        Objective:  Vitals:   06/11/22 0832 06/11/22 1627 06/12/22 0815 06/12/22 1158  BP: (!) 147/62 (!) 126/56 (!) 129/55   Pulse: 75 82 65   Resp: 18 18 18    Temp: (!) 97.3 F (36.3 C) (!) 97.5 F (36.4 C) (!) 97.5 F (36.4 C)   TempSrc: Oral Oral Oral   SpO2: 99% 99% 98% 98%  Weight:      Height:        Intake/Output Summary (Last 24 hours) at 06/12/2022 1321 Last data filed at 06/11/2022 1517 Gross per 24 hour  Intake 436.42 ml  Output --  Net 436.42 ml    Filed Weights   06/10/22 0141  Weight: 88.5 kg    Examination:  Constitutional:  VS as above General Appearance: alert, well-developed, well-nourished, NAD Eyes: Normal lids and conjunctive, non-icteric sclera Ears, Nose, Mouth, Throat: Normal external appearance MMM Neck: No masses, trachea midline Respiratory: Normal respiratory effort No wheeze No rhonchi No rales Cardiovascular: S1/S2 normal No murmur No rub/gallop auscultated No JVD No lower extremity edema Gastrointestinal: No tenderness Musculoskeletal:  Symmetrical movement in all extremities Neurological: No cranial nerve deficit on limited exam Alert Psychiatric: Normal judgment/insight Normal mood and affect       Scheduled Medications:   amLODipine  5 mg Oral Daily   vitamin C  500 mg Oral Daily   azelastine  1 spray Each Nare BID   calcium carbonate  1 tablet Oral TID WC   diclofenac Sodium  2 g Topical QID   heparin  5,000 Units Subcutaneous Q8H   hydrALAZINE  100 mg Oral BID   levothyroxine  150 mcg Oral Q0600   lidocaine  1 patch Transdermal Q12H   loratadine  10 mg Oral Daily   metoprolol tartrate  50 mg Oral BID   molnupiravir EUA  4 capsule Oral BID   montelukast  10 mg Oral QHS   multivitamin with minerals  1  tablet Oral Daily   pantoprazole  40 mg Oral QAC breakfast   [START ON 06/13/2022] potassium chloride SA  10 mEq Oral Daily   scopolamine  1 patch Transdermal Q72H   sertraline  100 mg Oral Daily   spironolactone  25 mg Oral Daily   zinc sulfate  220 mg Oral Daily    Continuous Infusions:  [START ON 06/13/2022] ampicillin-sulbactam (UNASYN) IV     azithromycin 500 mg (06/12/22 0824)   calcium gluconate     magnesium sulfate bolus IVPB 2 g (06/12/22 1246)   ondansetron (ZOFRAN) IV 8 mg (06/12/22 0950)   promethazine (PHENERGAN) injection (IM or IVPB) Stopped (06/11/22 1113)    PRN Medications:  acetaminophen, albuterol, chlorpheniramine-HYDROcodone, guaiFENesin-dextromethorphan, hydrALAZINE, HYDROcodone-acetaminophen, loperamide, ondansetron (ZOFRAN) IV, polyethylene glycol, promethazine (PHENERGAN) injection (IM or IVPB)  Antimicrobials:  Anti-infectives (From admission, onward)    Start     Dose/Rate Route Frequency Ordered Stop   06/13/22 1200  Ampicillin-Sulbactam (UNASYN) 3 g in sodium chloride 0.9 % 100 mL IVPB        3 g 200 mL/hr over 30 Minutes Intravenous Every 24 hours 06/12/22 1242     06/12/22 1230  molnupiravir EUA (LAGEVRIO) capsule  800 mg        4 capsule Oral 2 times daily 06/12/22 1132 06/17/22 0959   06/11/22 0600  azithromycin (ZITHROMAX) 500 mg in sodium chloride 0.9 % 250 mL IVPB        500 mg 250 mL/hr over 60 Minutes Intravenous Every 24 hours 06/10/22 0632 06/16/22 0559   06/10/22 1000  nirmatrelvir/ritonavir EUA (PAXLOVID) 3 tablet  Status:  Discontinued        3 tablet Oral 2 times daily 06/10/22 0614 06/10/22 0624   06/10/22 1000  molnupiravir EUA (LAGEVRIO) capsule 800 mg  Status:  Discontinued        4 capsule Oral 2 times daily 06/10/22 0624 06/10/22 0644   06/10/22 1000  molnupiravir EUA (LAGEVRIO) capsule 800 mg  Status:  Discontinued        4 capsule Oral 2 times daily 06/10/22 0650 06/10/22 1322   06/10/22 0800  Ampicillin-Sulbactam (UNASYN) 3 g  in sodium chloride 0.9 % 100 mL IVPB  Status:  Discontinued        3 g 200 mL/hr over 30 Minutes Intravenous Every 12 hours 06/10/22 0632 06/12/22 1242   06/10/22 0645  cefTRIAXone (ROCEPHIN) 2 g in sodium chloride 0.9 % 100 mL IVPB  Status:  Discontinued        2 g 200 mL/hr over 30 Minutes Intravenous Every 24 hours 06/10/22 0632 06/10/22 0632   06/10/22 0345  levofloxacin (LEVAQUIN) IVPB 750 mg        750 mg 100 mL/hr over 90 Minutes Intravenous  Once 06/10/22 0339 06/10/22 0629       Data Reviewed: I have personally reviewed following labs and imaging studies  CBC: Recent Labs  Lab 06/08/22 1448 06/10/22 0204 06/11/22 0651 06/12/22 0913  WBC 8.2 8.5 7.5 9.9  NEUTROABS 4.7 4.4 5.0 6.4  HGB 11.1* 10.9* 10.4* 10.4*  HCT 35.3* 33.7* 31.8* 31.9*  MCV 89.1 86.4 87.8 87.2  PLT 190 196 165 009    Basic Metabolic Panel: Recent Labs  Lab 06/08/22 1448 06/10/22 0204 06/11/22 0651 06/12/22 0912 06/12/22 0913  NA 138 139 145  --  143  K 4.3 3.8 3.2*  --  3.9  CL 107 108 117*  --  114*  CO2 18* 16* 18*  --  16*  GLUCOSE 121* 125* 106*  --  149*  BUN 67* 65* 52*  --  54*  CREATININE 2.47* 2.40* 2.23*  --  3.45*  CALCIUM 5.8* 5.7* 5.4*  --  5.5*  MG  --   --   --  <0.5*  --   PHOS  --   --   --  5.6*  --     GFR: Estimated Creatinine Clearance: 13.2 mL/min (A) (by C-G formula based on SCr of 3.45 mg/dL (H)). Liver Function Tests: Recent Labs  Lab 06/08/22 1448 06/10/22 0204 06/11/22 0651 06/12/22 0913  AST 30 27 27 30   ALT 23 21 19 18   ALKPHOS 50 51 46 47  BILITOT 0.8 0.7 0.7 0.6  PROT 7.8 7.9 7.4 7.3  ALBUMIN 4.1 4.1 3.7 3.9    No results for input(s): "LIPASE", "AMYLASE" in the last 168 hours. No results for input(s): "AMMONIA" in the last 168 hours. Coagulation Profile: No results for input(s): "INR", "PROTIME" in the last 168 hours. Cardiac Enzymes: No results for input(s): "CKTOTAL", "CKMB", "CKMBINDEX", "TROPONINI" in the last 168 hours. BNP (last 3  results) No results for input(s): "PROBNP" in the last 8760 hours. HbA1C: No  results for input(s): "HGBA1C" in the last 72 hours. CBG: Recent Labs  Lab 06/10/22 1119 06/10/22 1725 06/10/22 2201  GLUCAP 128* 101* 114*    Lipid Profile: No results for input(s): "CHOL", "HDL", "LDLCALC", "TRIG", "CHOLHDL", "LDLDIRECT" in the last 72 hours. Thyroid Function Tests: No results for input(s): "TSH", "T4TOTAL", "FREET4", "T3FREE", "THYROIDAB" in the last 72 hours. Anemia Panel: Recent Labs    06/11/22 0651 06/12/22 0913  FERRITIN 79 86    Urine analysis:    Component Value Date/Time   COLORURINE YELLOW (A) 12/29/2016 1011   APPEARANCEUR Clear 09/17/2018 1028   LABSPEC 1.036 (H) 12/29/2016 1011   LABSPEC 1.013 06/20/2012 1508   PHURINE 5.0 12/29/2016 1011   GLUCOSEU Negative 09/17/2018 1028   GLUCOSEU Negative 06/20/2012 1508   HGBUR SMALL (A) 12/29/2016 1011   BILIRUBINUR Negative 09/17/2018 1028   BILIRUBINUR Negative 06/20/2012 1508   KETONESUR NEGATIVE 12/29/2016 1011   PROTEINUR 3+ (A) 09/17/2018 1028   PROTEINUR 100 (A) 12/29/2016 1011   NITRITE Negative 09/17/2018 1028   NITRITE NEGATIVE 12/29/2016 1011   LEUKOCYTESUR Trace (A) 09/17/2018 1028   LEUKOCYTESUR Negative 06/20/2012 1508   Sepsis Labs: @LABRCNTIP (procalcitonin:4,lacticidven:4)  Recent Results (from the past 240 hour(s))  Resp Panel by RT-PCR (Flu A&B, Covid) Anterior Nasal Swab     Status: Abnormal   Collection Time: 06/08/22  2:37 PM   Specimen: Anterior Nasal Swab  Result Value Ref Range Status   SARS Coronavirus 2 by RT PCR POSITIVE (A) NEGATIVE Final    Comment: (NOTE) SARS-CoV-2 target nucleic acids are DETECTED.  The SARS-CoV-2 RNA is generally detectable in upper respiratory specimens during the acute phase of infection. Positive results are indicative of the presence of the identified virus, but do not rule out bacterial infection or co-infection with other pathogens not detected by the  test. Clinical correlation with patient history and other diagnostic information is necessary to determine patient infection status. The expected result is Negative.  Fact Sheet for Patients: EntrepreneurPulse.com.au  Fact Sheet for Healthcare Providers: IncredibleEmployment.be  This test is not yet approved or cleared by the [place] FDA and  has been authorized for detection and/or diagnosis of SARS-CoV-2 by FDA under an Emergency Use Authorization (EUA).  This EUA will remain in effect (meaning this test can be used) for the duration of  the COVID-19 declaration under Section 564(b)(1) of the A ct, 21 U.S.C. section 360bbb-3(b)(1), unless the authorization is terminated or revoked sooner.     Influenza A by PCR NEGATIVE NEGATIVE Final   Influenza B by PCR NEGATIVE NEGATIVE Final    Comment: (NOTE) The Xpert Xpress SARS-CoV-2/FLU/RSV plus assay is intended as an aid in the diagnosis of influenza from Nasopharyngeal swab specimens and should not be used as a sole basis for treatment. Nasal washings and aspirates are unacceptable for Xpert Xpress SARS-CoV-2/FLU/RSV testing.  Fact Sheet for Patients: EntrepreneurPulse.com.au  Fact Sheet for Healthcare Providers: IncredibleEmployment.be  This test is not yet approved or cleared by the [place] FDA and has been authorized for detection and/or diagnosis of SARS-CoV-2 by FDA under an Emergency Use Authorization (EUA). This EUA will remain in effect (meaning this test can be used) for the duration of the COVID-19 declaration under Section 564(b)(1) of the Act, 21 U.S.C. section 360bbb-3(b)(1), unless the authorization is terminated or revoked.  Performed at [facility], [address] [place], [place] [address]   Culture, blood (routine x 2)     Status: None (Preliminary result)  Collection Time: 06/10/22  2:04 AM   Specimen: BLOOD  RIGHT ARM  Result Value Ref Range Status   Specimen Description BLOOD RIGHT ARM  Final   Special Requests   Final    BOTTLES DRAWN AEROBIC AND ANAEROBIC Blood Culture adequate volume   Culture   Final    NO GROWTH 2 DAYS Performed at [facility], [address], [place], [place] [address]    Report Status PENDING  Incomplete  Culture, blood (routine x 2)     Status: None (Preliminary result)   Collection Time: 06/10/22  2:04 AM   Specimen: BLOOD RIGHT ARM  Result Value Ref Range Status   Specimen Description BLOOD RIGHT ARM  Final   Special Requests   Final    BOTTLES DRAWN AEROBIC AND ANAEROBIC Blood Culture results may not be optimal due to an inadequate volume of blood received in culture bottles   Culture   Final    NO GROWTH 2 DAYS Performed at [facility], [address] [place], [place] [address]    Report Status PENDING  Incomplete         Radiology Studies: DG Shoulder Right Port  Result Date: 06/10/2022 CLINICAL DATA:  Recent fall.  Shoulder pain. EXAM: RIGHT SHOULDER - 1 VIEW COMPARISON:  No comparison studies available. FINDINGS: Bones are demineralized. No fracture or evidence of dislocation. Degenerative changes are seen in the glenohumeral joint. Calcific tendinitis noted at the rotator cuff insertion. IMPRESSION: 1. No acute bony abnormality. 2. Degenerative changes in the glenohumeral joint. 3. Calcific tendinitis. Electronically Signed   By: Misty Stanley M.D.   On: 06/10/2022 07:45   CT ABDOMEN PELVIS WO CONTRAST  Result Date: 06/10/2022 CLINICAL DATA:  Weakness, nausea/vomiting, COVID EXAM: CT ABDOMEN AND PELVIS WITHOUT CONTRAST TECHNIQUE: Multidetector CT imaging of the abdomen and pelvis was performed following the standard protocol without IV contrast. RADIATION DOSE REDUCTION: This exam was performed according to the departmental dose-optimization program which includes automated exposure control, adjustment of the mA and/or kV according  to patient size and/or use of iterative reconstruction technique. COMPARISON:  12/29/2016 FINDINGS: Lower chest: Lung bases are clear. Hepatobiliary: Unenhanced liver is unremarkable. Status post cholecystectomy. No intrahepatic or extrahepatic ductal dilatation. Pancreas: Within normal limits. Spleen: Within normal limits. Adrenals/Urinary Tract: Adrenal glands are within normal limits. Right renal cysts, including a dominant 6.3 cm cyst in the right lower kidney (series 2/image 27), measuring simple fluid density, benign (Bosniak I). Left kidney is within normal limits. No hydronephrosis. Bladder is within normal limits. Stomach/Bowel: Stomach is within normal limits. No evidence of bowel obstruction. Appendix is not discretely visualized. Sigmoid diverticulosis, without evidence of diverticulitis. Vascular/Lymphatic: No evidence of abdominal aortic aneurysm. Atherosclerotic calcifications of the abdominal aorta and branch vessels. No suspicious abdominopelvic lymphadenopathy. Reproductive: Status post hysterectomy. Bilateral ovaries are within normal limits. Other: No abdominopelvic ascites. Musculoskeletal: Mild degenerative changes of the visualized thoracolumbar spine. IMPRESSION: Sigmoid diverticulosis, without evidence of diverticulitis. Additional ancillary findings as above Electronically Signed   By: Julian Hy M.D.   On: 06/10/2022 02:57   DG Chest Port 1 View  Result Date: 06/10/2022 CLINICAL DATA:  Shortness of breath EXAM: PORTABLE CHEST 1 VIEW COMPARISON:  06/08/2022 FINDINGS: Mild patchy opacity at the medial right lung base. Left lung is clear. No pleural effusion or pneumothorax. Cardiomegaly.  Thoracic aortic atherosclerosis. Visualized osseous structures are within normal limits. IMPRESSION: Mild patchy opacity at the medial right lung base, raising the possibility of mild infection/pneumonia.  Electronically Signed   By: Julian Hy M.D.   On: 06/10/2022 02:36             LOS: 2 days     Emeterio Reeve, DO [facility] 06/12/2022, 1:21 PM   Staff may message me via secure chat in [place]  but this may not receive immediate response,  please page for urgent matters!  If 7PM-7AM, please contact night-coverage [email or web address]  Dictation software was used to generate the above note. Typos may occur and escape review, as with typed/written notes. Please contact Dr Sheppard Coil directly for clarity if needed.

## 2022-06-12 NOTE — Progress Notes (Signed)
Pharmacy Electrolyte Monitoring Consult:  Pharmacy consulted to assist in monitoring and replacing electrolytes in this 81 y.o. female admitted on 06/10/2022 with Shortness of Breath Covid+  Labs:  Sodium (mmol/L)  Date Value  06/12/2022 142  08/11/2014 141  06/20/2012 142   Potassium (mmol/L)  Date Value  06/12/2022 4.1  06/20/2012 3.2 (L)   Magnesium (mg/dL)  Date Value  06/12/2022 1.0 (L)   Phosphorus (mg/dL)  Date Value  06/12/2022 5.8 (H)   Calcium (mg/dL)  Date Value  06/12/2022 5.9 (LL)   Calcium, Total (mg/dL)  Date Value  06/20/2012 9.4   Albumin (g/dL)  Date Value  06/12/2022 3.9  06/20/2012 3.8    Assessment/Plan: K 3.9>4.1   Mag <0.5 to 1   Phos 5.6>5.8   Scr 3.45>4.01 Ca 5.5>5.9  albumin 3.9 Medications: calcium carbonate 1249m PO TID, KCL 20 meq PO BID > 157m PO QD, spironolactone 2571maily, torsemide 10-47m30mily, on calcitriol 0.5mcg46m MIVF: on LR @75ml /h  Will repeat Magnesium sulfate 2 gm IV x 1 - conservative dosing d/t renal fxn Will repeat Calcium gluconate 1 gram IV x 1 - Per [facility] order Will continue with reduced PO KCL to 10 meq po daily for now-watch with renal fxn Holding losartan and torsemide  -F/u electrolytes with am labs -Nephrology consulted  BrandLorna Dibble/2023 6:24 PM

## 2022-06-12 NOTE — Progress Notes (Signed)
[facility]  ROUNDING NOTE   Subjective:   [name] is a 81 year old female with medical concerns including GERD, diabetes, hypertension, dyslipidemia, hypothyroidism, and chronic kidney disease stage IV.  Patient presents to the emergency department complaining of shortness of breath and has been admitted for Hypocalcemia [E83.51] Generalized weakness [R53.1] Nausea vomiting and diarrhea [R11.2, R19.7] Pneumonia due to COVID-19 virus [U07.1, J12.82] COVID-19 [U07.1]  Patient is known to our practice and is followed outpatient by Dr. Juleen China.  Patient states she had poor appetite prior to admission, only consuming popsicles and Jell-O.  Denies vomiting but has dry heaves.  Patient reports dizziness and diarrhea.  She continues to have diarrhea.  Denies cough.  Reports mild shortness of breath with exertion.  Remains on room air.  No known fever or chills.  Patient states appetite has improved since admission.  No lower extremity edema.  Continues to feel weak.  Labs on ED arrival significant for BUN 65, creatinine 2.40 with GFR 20, calcium 5.7, and hemoglobin 10.9.  Chest x-ray suspicious for pneumonia.  CT abdomen pelvis shows sigmoid diverticulosis.  Respiratory panel positive for COVID-19 infection.  We have been consulted to manage acute kidney injury.   Objective:  Vital signs in last 24 hours:  Temp:  [97.5 F (36.4 C)-98.3 F (36.8 C)] 98.3 F (36.8 C) (09/11 1544) Pulse Rate:  [62-65] 62 (09/11 1544) Resp:  [16-18] 16 (09/11 1544) BP: (113-129)/(52-55) 113/52 (09/11 1544) SpO2:  [94 %-98 %] 94 % (09/11 1544)  Weight change:  Filed Weights   06/10/22 0141  Weight: 88.5 kg    Intake/Output: I/O last 3 completed shifts: In: 1063.7 [P.O.:120; I.V.:133.7; IV Piggyback:810] Out: 450 [Urine:450]   Intake/Output this shift:  No intake/output data recorded.  Physical Exam: General: NAD, resting comfortably  Head: Normocephalic, atraumatic. Moist oral  mucosal membranes  Eyes: Anicteric  Neck: Supple  Lungs:  Clear to auscultation, normal effort, room air  Heart: Regular rate and rhythm  Abdomen:  Soft, nontender, obese  Extremities: No peripheral edema.  Neurologic: Nonfocal, moving all four extremities  Skin: No lesions, dry lower extremities  Access: None    Basic Metabolic Panel: Recent Labs  Lab 06/08/22 1448 06/10/22 0204 06/11/22 0651 06/12/22 0912 06/12/22 0913 06/12/22 1707  NA 138 139 145  --  143 142  K 4.3 3.8 3.2*  --  3.9 4.1  CL 107 108 117*  --  114* 116*  CO2 18* 16* 18*  --  16* 14*  GLUCOSE 121* 125* 106*  --  149* 93  BUN 67* 65* 52*  --  54* 55*  CREATININE 2.47* 2.40* 2.23*  --  3.45* 4.01*  CALCIUM 5.8* 5.7* 5.4*  --  5.5* 5.9*  MG  --   --   --  <0.5*  --  1.0*  PHOS  --   --   --  5.6*  --  5.8*    Liver Function Tests: Recent Labs  Lab 06/08/22 1448 06/10/22 0204 06/11/22 0651 06/12/22 0913  AST 30 27 27 30   ALT 23 21 19 18   ALKPHOS 50 51 46 47  BILITOT 0.8 0.7 0.7 0.6  PROT 7.8 7.9 7.4 7.3  ALBUMIN 4.1 4.1 3.7 3.9   No results for input(s): "LIPASE", "AMYLASE" in the last 168 hours. No results for input(s): "AMMONIA" in the last 168 hours.  CBC: Recent Labs  Lab 06/08/22 1448 06/10/22 0204 06/11/22 0651 06/12/22 0913  WBC 8.2 8.5 7.5 9.9  NEUTROABS 4.7 4.4 5.0 6.4  HGB 11.1* 10.9* 10.4* 10.4*  HCT 35.3* 33.7* 31.8* 31.9*  MCV 89.1 86.4 87.8 87.2  PLT 190 196 165 181    Cardiac Enzymes: No results for input(s): "CKTOTAL", "CKMB", "CKMBINDEX", "TROPONINI" in the last 168 hours.  BNP: Invalid input(s): "POCBNP"  CBG: Recent Labs  Lab 06/10/22 1119 06/10/22 1725 06/10/22 2201  GLUCAP 128* 101* 114*    Microbiology: Results for orders placed or performed during the hospital encounter of 06/10/22  Culture, blood (routine x 2)     Status: None (Preliminary result)   Collection Time: 06/10/22  2:04 AM   Specimen: BLOOD RIGHT ARM  Result Value Ref Range Status    Specimen Description BLOOD RIGHT ARM  Final   Special Requests   Final    BOTTLES DRAWN AEROBIC AND ANAEROBIC Blood Culture adequate volume   Culture   Final    NO GROWTH 2 DAYS Performed at [facility], [address] [place], [place] [address]    Report Status PENDING  Incomplete  Culture, blood (routine x 2)     Status: None (Preliminary result)   Collection Time: 06/10/22  2:04 AM   Specimen: BLOOD RIGHT ARM  Result Value Ref Range Status   Specimen Description BLOOD RIGHT ARM  Final   Special Requests   Final    BOTTLES DRAWN AEROBIC AND ANAEROBIC Blood Culture results may not be optimal due to an inadequate volume of blood received in culture bottles   Culture   Final    NO GROWTH 2 DAYS Performed at [facility], [address], [place], [place] [address]    Report Status PENDING  Incomplete    Coagulation Studies: No results for input(s): "LABPROT", "INR" in the last 72 hours.  Urinalysis: No results for input(s): "COLORURINE", "LABSPEC", "PHURINE", "GLUCOSEU", "HGBUR", "BILIRUBINUR", "KETONESUR", "PROTEINUR", "UROBILINOGEN", "NITRITE", "LEUKOCYTESUR" in the last 72 hours.  Invalid input(s): "APPERANCEUR"    Imaging: No results found.   Medications:    [START ON 06/13/2022] ampicillin-sulbactam (UNASYN) IV     calcium gluconate     [START ON 06/13/2022] doxycycline (VIBRAMYCIN) IV     lactated ringers 75 mL/hr at 06/12/22 1713   promethazine (PHENERGAN) injection (IM or IVPB) Stopped (06/11/22 1113)    amLODipine  5 mg Oral Daily   vitamin C  500 mg Oral Daily   azelastine  1 spray Each Nare BID   calcitRIOL  0.5 mcg Oral Daily   calcium carbonate  1 tablet Oral TID WC   diclofenac Sodium  2 g Topical QID   heparin  5,000 Units Subcutaneous Q8H   hydrALAZINE  100 mg Oral BID   levothyroxine  150 mcg Oral Q0600   lidocaine  1 patch Transdermal Q12H   loratadine  10 mg Oral Daily   metoprolol tartrate  50 mg Oral BID   molnupiravir EUA   4 capsule Oral BID   montelukast  10 mg Oral QHS   multivitamin with minerals  1 tablet Oral Daily   pantoprazole  40 mg Oral QAC breakfast   [START ON 06/13/2022] potassium chloride SA  10 mEq Oral Daily   scopolamine  1 patch Transdermal Q72H   sertraline  100 mg Oral Daily   spironolactone  25 mg Oral Daily   zinc sulfate  220 mg Oral Daily   acetaminophen, albuterol, chlorpheniramine-HYDROcodone, guaiFENesin-dextromethorphan, hydrALAZINE, HYDROcodone-acetaminophen, loperamide, polyethylene glycol, promethazine (PHENERGAN) injection (IM or IVPB)  Assessment/ Plan:  Ms. Cliffie Gingras Olkowski  is a 81 y.o.  female with medical concerns including GERD, diabetes, hypertension, dyslipidemia, hypothyroidism, and chronic kidney disease stage IV.  Patient presents to the emergency department complaining of shortness of breath and has been admitted for Hypocalcemia [E83.51] Generalized weakness [R53.1] Nausea vomiting and diarrhea [R11.2, R19.7] Pneumonia due to COVID-19 virus [U07.1, J12.82] COVID-19 [U07.1]   Acute Kidney Injury on chronic kidney disease stage 4 with baseline creatinine 2.7 and GFR of 17 on 05/09/2022.  Acute kidney injury secondary to hypovolemia from poor oral intake. Chronic kidney disease is secondary to positive ANA, age, hypertension and diabetes CT abdomen pelvis shows multiple renal cyst and right kidney, left kidney within normal limits with no obstruction. Creatinine currently elevated.  No urine output recorded however patient states she is voiding regularly.  Will order IV fluids, lactated Ringer's at 75 mL/h as patient appears to be dehydrated.  Continue to avoid nephrotoxic agents and therapies.  No acute need for dialysis at this time however patient adamant she is not interested in dialysis.  Lab Results  Component Value Date   CREATININE 4.01 (H) 06/12/2022   CREATININE 3.45 (H) 06/12/2022   CREATININE 2.23 (H) 06/11/2022   No intake or output data in the 24 hours  ending 06/12/22 1811  2. Hypocalcemia Calcium 5.7 on admission. Receiving IV and oral supplementation by primary team.   3. Anemia of chronic kidney disease Lab Results  Component Value Date   HGB 10.4 (L) 06/12/2022    Hgb within desired range  4. Hypertension with chronic kidney disease. Home regimen includes amlodipine, hydralazine, losartan, metoprolol, spironolactone, and torsemide. Torsemide and losartan held.    5. Diabetes mellitus type II with chronic kidney disease/renal manifestations: noninsulin dependent.Most recent hemoglobin A1c is 6.4 on 08/24/29.   \    LOS: 2 [place] 9/11/20236:11 PM

## 2022-06-12 NOTE — Progress Notes (Signed)
PHARMACY NOTE:  ANTIMICROBIAL RENAL DOSAGE ADJUSTMENT  Current antimicrobial regimen includes a mismatch between antimicrobial dosage and estimated renal function.  As per policy approved by the Pharmacy & Therapeutics and Medical Executive Committees, the antimicrobial dosage will be adjusted accordingly.  Current antimicrobial dosage:  Unasyn 3 gm q12h  Indication: Aspiration PNA  Renal Function:  Estimated Creatinine Clearance: 13.2 mL/min (A) (by C-G formula based on SCr of 3.45 mg/dL (H)).     Antimicrobial dosage has been changed to:   Unasyn 3 gm IV q24h  Additional comments:   Thank you for allowing pharmacy to be a part of this patient's care.  Joesph Marcy A, [facility] 06/12/2022 12:43 PM

## 2022-06-12 NOTE — Progress Notes (Signed)
Pharmacy Electrolyte Monitoring Consult:  Pharmacy consulted to assist in monitoring and replacing electrolytes in this 81 y.o. female admitted on 06/10/2022 with Shortness of Breath Covid+  Labs:  Sodium (mmol/L)  Date Value  06/12/2022 143  08/11/2014 141  06/20/2012 142   Potassium (mmol/L)  Date Value  06/12/2022 3.9  06/20/2012 3.2 (L)   Magnesium (mg/dL)  Date Value  06/12/2022 <0.5 (LL)   Phosphorus (mg/dL)  Date Value  06/12/2022 5.6 (H)   Calcium (mg/dL)  Date Value  06/12/2022 5.5 (LL)   Calcium, Total (mg/dL)  Date Value  06/20/2012 9.4   Albumin (g/dL)  Date Value  06/12/2022 3.9  06/20/2012 3.8    Assessment/Plan: K 3.9   Mag <0.5   Phos 5.6   Scr 3.45  Ca 5.5  albumin 3.9 -currently ordered calcium carbonate 1236m PO TID, KCL 20 meq PO BID, spironolactone 268mdaily, torsemide 10-2044maily  -Will order Magnesium sulfate 2 gm IV x 1- conservative dosing d/t renal fxn -Will order Calcium gluconate 1 gram IV x 1 -will decrease po KCL to 10 meq po daily for now-watch with renal fxn -holding losartan and torsemide  -f/u electrolytes at 1700 -f/u electrolytes with am labs -nephrology consulted   Akhil Piscopo A 06/12/2022 1:06 PM

## 2022-06-12 NOTE — Progress Notes (Signed)
PT Cancellation Note  Patient Details Name: [name] MRN: [id number] DOB: December 21, 1940   Cancelled Treatment:    Reason Eval/Treat Not Completed: Medical issues which prohibited therapy (Discussed with Dr Sheppard Coil to hold PT today given her calcium of 5.5. PT to follow up tomorrow.)  Minna Merritts, PT, MPT  Percell Locus 06/12/2022, 2:23 PM

## 2022-06-13 DIAGNOSIS — U071 COVID-19: Secondary | ICD-10-CM | POA: Diagnosis not present

## 2022-06-13 DIAGNOSIS — J1282 Pneumonia due to coronavirus disease 2019: Secondary | ICD-10-CM | POA: Diagnosis not present

## 2022-06-13 LAB — PHOSPHORUS
Phosphorus: 5.4 mg/dL — ABNORMAL HIGH (ref 2.5–4.6)
Phosphorus: 5.8 mg/dL — ABNORMAL HIGH (ref 2.5–4.6)

## 2022-06-13 LAB — URINALYSIS, ROUTINE W REFLEX MICROSCOPIC
Bilirubin Urine: NEGATIVE
Glucose, UA: NEGATIVE mg/dL
Hgb urine dipstick: NEGATIVE
Ketones, ur: NEGATIVE mg/dL
Leukocytes,Ua: NEGATIVE
Nitrite: NEGATIVE
Protein, ur: NEGATIVE mg/dL
Specific Gravity, Urine: 1.015 (ref 1.005–1.030)
pH: 5 (ref 5.0–8.0)

## 2022-06-13 LAB — COMPREHENSIVE METABOLIC PANEL
ALT: 15 U/L (ref 0–44)
AST: 24 U/L (ref 15–41)
Albumin: 3.5 g/dL (ref 3.5–5.0)
Alkaline Phosphatase: 46 U/L (ref 38–126)
Anion gap: 13 (ref 5–15)
BUN: 61 mg/dL — ABNORMAL HIGH (ref 8–23)
CO2: 14 mmol/L — ABNORMAL LOW (ref 22–32)
Calcium: 6.2 mg/dL — CL (ref 8.9–10.3)
Chloride: 113 mmol/L — ABNORMAL HIGH (ref 98–111)
Creatinine, Ser: 4.49 mg/dL — ABNORMAL HIGH (ref 0.44–1.00)
GFR, Estimated: 9 mL/min — ABNORMAL LOW (ref >60)
Glucose, Bld: 100 mg/dL — ABNORMAL HIGH (ref 70–99)
Potassium: 4.1 mmol/L (ref 3.5–5.1)
Sodium: 140 mmol/L (ref 135–145)
Total Bilirubin: 0.4 mg/dL (ref 0.3–1.2)
Total Protein: 6.8 g/dL (ref 6.5–8.1)

## 2022-06-13 LAB — MAGNESIUM
Magnesium: 1.3 mg/dL — ABNORMAL LOW (ref 1.7–2.4)
Magnesium: 1.1 mg/dL — ABNORMAL LOW (ref 1.7–2.4)

## 2022-06-13 LAB — CBC WITH DIFFERENTIAL/PLATELET
Abs Immature Granulocytes: 0.06 10*3/uL (ref 0.00–0.07)
Basophils Absolute: 0 10*3/uL (ref 0.0–0.1)
Basophils Relative: 0 %
Eosinophils Absolute: 0.4 10*3/uL (ref 0.0–0.5)
Eosinophils Relative: 4 %
HCT: 29.1 % — ABNORMAL LOW (ref 36.0–46.0)
Hemoglobin: 9.5 g/dL — ABNORMAL LOW (ref 12.0–15.0)
Immature Granulocytes: 1 %
Lymphocytes Relative: 24 %
Lymphs Abs: 2 10*3/uL (ref 0.7–4.0)
MCH: 28.9 pg (ref 26.0–34.0)
MCHC: 32.6 g/dL (ref 30.0–36.0)
MCV: 88.4 fL (ref 80.0–100.0)
Monocytes Absolute: 0.8 10*3/uL (ref 0.1–1.0)
Monocytes Relative: 9 %
Neutro Abs: 5.2 10*3/uL (ref 1.7–7.7)
Neutrophils Relative %: 62 %
Platelets: 142 10*3/uL — ABNORMAL LOW (ref 150–400)
RBC: 3.29 MIL/uL — ABNORMAL LOW (ref 3.87–5.11)
RDW: 14.6 % (ref 11.5–15.5)
WBC: 8.4 10*3/uL (ref 4.0–10.5)
nRBC: 0 % (ref 0.0–0.2)

## 2022-06-13 LAB — C-REACTIVE PROTEIN: CRP: 4.2 mg/dL — ABNORMAL HIGH (ref <1.0)

## 2022-06-13 LAB — BASIC METABOLIC PANEL
Anion gap: 12 (ref 5–15)
BUN: 63 mg/dL — ABNORMAL HIGH (ref 8–23)
CO2: 16 mmol/L — ABNORMAL LOW (ref 22–32)
Calcium: 6.6 mg/dL — ABNORMAL LOW (ref 8.9–10.3)
Chloride: 109 mmol/L (ref 98–111)
Creatinine, Ser: 4.8 mg/dL — ABNORMAL HIGH (ref 0.44–1.00)
GFR, Estimated: 9 mL/min — ABNORMAL LOW (ref >60)
Glucose, Bld: 104 mg/dL — ABNORMAL HIGH (ref 70–99)
Potassium: 4.5 mmol/L (ref 3.5–5.1)
Sodium: 137 mmol/L (ref 135–145)

## 2022-06-13 LAB — FERRITIN: Ferritin: 82 ng/mL (ref 11–307)

## 2022-06-13 LAB — ALBUMIN: Albumin: 3.1 g/dL — ABNORMAL LOW (ref 3.5–5.0)

## 2022-06-13 MED ORDER — MAGNESIUM SULFATE 2 GM/50ML IV SOLN
2.0000 g | Freq: Once | INTRAVENOUS | Status: AC
  Administered 2022-06-13: 2 g via INTRAVENOUS
  Filled 2022-06-13: qty 50

## 2022-06-13 MED ORDER — MAGNESIUM SULFATE 4 GM/100ML IV SOLN
4.0000 g | Freq: Once | INTRAVENOUS | Status: AC
  Administered 2022-06-13: 4 g via INTRAVENOUS
  Filled 2022-06-13: qty 100

## 2022-06-13 MED ORDER — CALCIUM GLUCONATE-NACL 1-0.675 GM/50ML-% IV SOLN
1.0000 g | Freq: Once | INTRAVENOUS | Status: AC
  Administered 2022-06-13: 1000 mg via INTRAVENOUS
  Filled 2022-06-13: qty 50

## 2022-06-13 MED ORDER — SODIUM CHLORIDE 0.9 % IV SOLN
3.0000 g | Freq: Two times a day (BID) | INTRAVENOUS | Status: AC
  Administered 2022-06-13 – 2022-06-15 (×6): 3 g via INTRAVENOUS
  Filled 2022-06-13: qty 3
  Filled 2022-06-13 (×2): qty 8
  Filled 2022-06-13: qty 3
  Filled 2022-06-13 (×2): qty 8

## 2022-06-13 NOTE — Plan of Care (Signed)

## 2022-06-13 NOTE — Progress Notes (Signed)
Spoke with the patient She lives at home with her husband who has dementia,  She has a rolling walker and needs a shower seat, [employer] to deliver to the home [facility] set up for [facility] services Her husband will transport home

## 2022-06-13 NOTE — Plan of Care (Signed)
  Problem: Education: Goal: Knowledge of risk factors and measures for prevention of condition will improve 06/13/2022 0822 by Alferd Apa, RN Outcome: Progressing 06/13/2022 0820 by Alferd Apa, RN Outcome: Progressing   Problem: Coping: Goal: Psychosocial and spiritual needs will be supported 06/13/2022 5498 by Alferd Apa, RN Outcome: Progressing 06/13/2022 0820 by Alferd Apa, RN Outcome: Progressing   Problem: Respiratory: Goal: Will maintain a patent airway 06/13/2022 0822 by Alferd Apa, RN Outcome: Progressing 06/13/2022 0820 by Alferd Apa, RN Outcome: Progressing Goal: Complications related to the disease process, condition or treatment will be avoided or minimized 06/13/2022 0822 by Alferd Apa, RN Outcome: Progressing 06/13/2022 0820 by Alferd Apa, RN Outcome: Progressing   Problem: Education: Goal: Knowledge of General Education information will improve Description: Including pain rating scale, medication(s)/side effects and non-pharmacologic comfort measures 06/13/2022 0822 by Alferd Apa, RN Outcome: Progressing 06/13/2022 0820 by Alferd Apa, RN Outcome: Progressing   Problem: Health Behavior/Discharge Planning: Goal: Ability to manage health-related needs will improve 06/13/2022 0822 by Alferd Apa, RN Outcome: Progressing 06/13/2022 0820 by Alferd Apa, RN Outcome: Progressing   Problem: Clinical Measurements: Goal: Ability to maintain clinical measurements within normal limits will improve 06/13/2022 0822 by Alferd Apa, RN Outcome: Progressing 06/13/2022 0820 by Alferd Apa, RN Outcome: Progressing Goal: Will remain free from infection 06/13/2022 0822 by Alferd Apa, RN Outcome: Progressing 06/13/2022 0820 by Alferd Apa, RN Outcome: Progressing Goal: Diagnostic test results will improve 06/13/2022 0822 by Alferd Apa, RN Outcome: Progressing 06/13/2022 0820 by Alferd Apa, RN Outcome: Progressing Goal: Respiratory  complications will improve 06/13/2022 0822 by Alferd Apa, RN Outcome: Progressing 06/13/2022 0820 by Alferd Apa, RN Outcome: Progressing Goal: Cardiovascular complication will be avoided 06/13/2022 0822 by Alferd Apa, RN Outcome: Progressing 06/13/2022 0820 by Alferd Apa, RN Outcome: Progressing   Problem: Activity: Goal: Risk for activity intolerance will decrease 06/13/2022 0822 by Alferd Apa, RN Outcome: Progressing 06/13/2022 0820 by Alferd Apa, RN Outcome: Progressing   Problem: Nutrition: Goal: Adequate nutrition will be maintained 06/13/2022 0822 by Alferd Apa, RN Outcome: Progressing 06/13/2022 0820 by Alferd Apa, RN Outcome: Progressing   Problem: Coping: Goal: Level of anxiety will decrease 06/13/2022 0822 by Alferd Apa, RN Outcome: Progressing 06/13/2022 0820 by Alferd Apa, RN Outcome: Progressing   Problem: Elimination: Goal: Will not experience complications related to bowel motility 06/13/2022 0822 by Alferd Apa, RN Outcome: Progressing 06/13/2022 0820 by Alferd Apa, RN Outcome: Progressing Goal: Will not experience complications related to urinary retention 06/13/2022 0822 by Alferd Apa, RN Outcome: Progressing 06/13/2022 0820 by Alferd Apa, RN Outcome: Progressing   Problem: Pain Managment: Goal: General experience of comfort will improve 06/13/2022 0822 by Alferd Apa, RN Outcome: Progressing 06/13/2022 0820 by Alferd Apa, RN Outcome: Progressing   Problem: Safety: Goal: Ability to remain free from injury will improve 06/13/2022 0822 by Alferd Apa, RN Outcome: Progressing 06/13/2022 0820 by Alferd Apa, RN Outcome: Progressing   Problem: Skin Integrity: Goal: Risk for impaired skin integrity will decrease 06/13/2022 0822 by Alferd Apa, RN Outcome: Progressing 06/13/2022 0820 by Alferd Apa, RN Outcome: Progressing

## 2022-06-13 NOTE — Evaluation (Signed)
Physical Therapy Evaluation Patient Details Name: [name] MRN: [id number] DOB: 01/03/1941 Today's Date: 06/13/2022  History of Present Illness  Patient is a 81 y.o. Caucasian female with medical history significant for asthma, CHF, type diabetes mellitus, GERD, hypertension, dyslipidemia, CKD stage IV, hypothyroidism and OSA, who presented to the ER with acute onset of generalized weakness with associated nausea, vomiting and diarrhea, recent diagnosis of COVID-19.   Clinical Impression  Patient is agreeable to PT evaluation. The patient reports she is the primary caregiver for her spouse who has dementia (he was at the bedside today). She is independent with mobility at baseline.  The patient reports mild right flank pain today. She was able to stand easily from the bed x 2 bouts but has limited endurance for standing activity. She is not at her baseline level of functional mobility. Recommend PT follow up to maximize independence and decrease caregiver burden. Patient wishes to go home at discharge. Recommend HHPT with family support as needed.      Recommendations for follow up therapy are one component of a multi-disciplinary discharge planning process, led by the attending physician.  Recommendations may be updated based on patient status, additional functional criteria and insurance authorization.  Follow Up Recommendations Home health PT      Assistance Recommended at Discharge Set up Supervision/Assistance  Patient can return home with the following  A little help with walking and/or transfers;A little help with bathing/dressing/bathroom;Assist for transportation;Help with stairs or ramp for entrance;Assistance with cooking/housework    Equipment Recommendations None recommended by PT  Recommendations for Other Services       Functional Status Assessment Patient has had a recent decline in their functional status and demonstrates the ability to make significant improvements  in function in a reasonable and predictable amount of time.     Precautions / Restrictions Precautions Precautions: Fall Restrictions Weight Bearing Restrictions: No      Mobility  Bed Mobility Overal bed mobility: Needs Assistance Bed Mobility: Supine to Sit, Sit to Supine     Supine to sit: Min assist, HOB elevated Sit to supine: Supervision   General bed mobility comments: assistance for trunk support. verbal cues for technique    Transfers Overall transfer level: Needs assistance Equipment used: Rolling walker (2 wheels) Transfers: Sit to/from Stand Sit to Stand: Min guard           General transfer comment: 2 standing bouts performed from bed. cues for technique provided    Ambulation/Gait         Gait velocity: ambulation not attempted due to fatigue with activity. standing tolerance ~ 2 minutes        Stairs            Wheelchair Mobility    Modified Rankin (Stroke Patients Only)       Balance Overall balance assessment: Needs assistance Sitting-balance support: Feet supported Sitting balance-Leahy Scale: Fair     Standing balance support: Single extremity supported, Bilateral upper extremity supported Standing balance-Leahy Scale: Fair Standing balance comment: with RW for support. cues for technique                             Pertinent Vitals/Pain Pain Assessment Pain Assessment: Faces Faces Pain Scale: Hurts a little bit Pain Location: right ribs/flank    Home Living Family/patient expects to be discharged to:: Private residence Living Arrangements: Spouse/significant other Available Help at Discharge: Family Type of Home: [place]  Access: Stairs to enter   [employer] of Steps: 1   Home Layout: Laundry or work area in [place]: Conservation officer, nature (2 wheels) Additional Comments: patient's spouse has dementia    Prior Function Prior Level of Function : Independent/Modified  Independent;Driving             Mobility Comments: independent ADLs Comments: patient reports she is independent with standing to shower and ADLs, driving     Hand Dominance        Extremity/Trunk Assessment   Upper Extremity Assessment Upper Extremity Assessment: Overall WFL for tasks assessed    Lower Extremity Assessment Lower Extremity Assessment: Generalized weakness       Communication   Communication: No difficulties  Cognition Arousal/Alertness: Awake/alert Behavior During Therapy: WFL for tasks assessed/performed Overall Cognitive Status: Within Functional Limits for tasks assessed                                          General Comments      Exercises     Assessment/Plan    PT Assessment Patient needs continued PT services  PT Problem List Decreased strength;Decreased balance;Decreased mobility;Decreased activity tolerance       PT Treatment Interventions DME instruction;Gait training;Stair training;Functional mobility training;Therapeutic activities;Therapeutic exercise;Balance training;Neuromuscular re-education;Cognitive remediation;Patient/family education    PT Goals (Current goals can be found in the Care Plan section)  Acute Rehab PT Goals Patient Stated Goal: to return home PT Goal Formulation: With patient Time For Goal Achievement: 06/27/22 Potential to Achieve Goals: Good    Frequency Min 2X/week     Co-evaluation               AM-PAC PT "6 Clicks" Mobility  Outcome Measure Help needed turning from your back to your side while in a flat bed without using bedrails?: A Little Help needed moving from lying on your back to sitting on the side of a flat bed without using bedrails?: A Little Help needed moving to and from a bed to a chair (including a wheelchair)?: A Little Help needed standing up from a chair using your arms (e.g., wheelchair or bedside chair)?: A Little Help needed to walk in hospital room?:  A Little Help needed climbing 3-5 steps with a railing? : A Lot 6 Click Score: 17    End of Session Equipment Utilized During Treatment: Oxygen Activity Tolerance: Patient tolerated treatment well Patient left: in bed;with call bell/phone within reach;with family/visitor present Nurse Communication: Mobility status PT Visit Diagnosis: Unsteadiness on feet (R26.81);Muscle weakness (generalized) (M62.81)    Time: 4496-7591 PT Time Calculation (min) (ACUTE ONLY): 30 min   Charges:   PT Evaluation $PT Eval Low Complexity: 1 Low PT Treatments $Therapeutic Activity: 8-22 mins       Minna Merritts, PT, MPT   Percell Locus 06/13/2022, 3:04 PM

## 2022-06-13 NOTE — Progress Notes (Signed)
Pharmacy Electrolyte Monitoring Consult:  Pharmacy consulted to assist in monitoring and replacing electrolytes in this 81 y.o. female admitted on 06/10/2022 with Shortness of Breath Covid+  Labs:  Sodium (mmol/L)  Date Value  06/13/2022 140  08/11/2014 141  06/20/2012 142   Potassium (mmol/L)  Date Value  06/13/2022 4.1  06/20/2012 3.2 (L)   Magnesium (mg/dL)  Date Value  06/13/2022 1.3 (L)   Phosphorus (mg/dL)  Date Value  06/13/2022 5.4 (H)   Calcium (mg/dL)  Date Value  06/13/2022 6.2 (LL)   Calcium, Total (mg/dL)  Date Value  06/20/2012 9.4   Albumin (g/dL)  Date Value  06/13/2022 3.5  06/20/2012 3.8    Assessment/Plan: K 3.9>4.1>4.1    Mag <0.5 >1 >1.3 Phos 5.6>5.8>5.4 Ca 5.5>5.9>6.2     .. albumin 3.9>3.5 Scr 3.45>4.01>4.49  Medications: calcium carbonate 1260m PO TID, spironolactone 250mdaily, started on calcitriol 0.84m884mQD 9/11 -Holding losartan and torsemide  MIVF: on LR @75ml /h  Will repeat Magnesium sulfate 2 gm IV x 1 - conservative dosing d/t renal fxn Will repeat Calcium gluconate 1 gram IV x 1 -  Will d/c KCL PO for now and follow.  -F/u electrolytes at 1800 -F/u electrolytes with am labs -Nephrology following  [name] 06/13/2022 7:53 AM

## 2022-06-13 NOTE — Progress Notes (Signed)
Pharmacy Electrolyte Monitoring Consult:  Pharmacy consulted to assist in monitoring and replacing electrolytes in this 81 y.o. female admitted on 06/10/2022 with Shortness of Breath Covid+  Labs:  Sodium (mmol/L)  Date Value  06/13/2022 137  08/11/2014 141  06/20/2012 142   Potassium (mmol/L)  Date Value  06/13/2022 4.5  06/20/2012 3.2 (L)   Magnesium (mg/dL)  Date Value  06/13/2022 1.1 (L)   Phosphorus (mg/dL)  Date Value  06/13/2022 5.8 (H)   Calcium (mg/dL)  Date Value  06/13/2022 6.6 (L)   Calcium, Total (mg/dL)  Date Value  06/20/2012 9.4   Albumin (g/dL)  Date Value  06/13/2022 3.1 (L)  06/20/2012 3.8    Assessment/Plan: K 3.9>4.1>4.1    Mag <0.5 >1 >1.3 Phos 5.6>5.8>5.4 Ca 5.5>5.9>6.2     .. albumin 3.9>3.5 Scr 3.45>4.01>4.49  Medications: calcium carbonate 124m PO TID, spironolactone 243mdaily, started on calcitriol 0.34m49mQD 9/11 -Holding losartan and torsemide  MIVF: on LR @75ml /h  Mg 4 g x 1.   KisOswald HillockharmD, BCPS 06/13/2022 7:27 PM

## 2022-06-13 NOTE — Progress Notes (Signed)
PHARMACY NOTE:  ANTIMICROBIAL RENAL DOSAGE ADJUSTMENT  Current antimicrobial regimen includes a mismatch between antimicrobial dosage and estimated renal function.  As per policy approved by the Pharmacy & Therapeutics and Medical Executive Committees, the antimicrobial dosage will be adjusted accordingly.  Current antimicrobial dosage:  Unasyn 3 gm q24h  Indication: Aspiration PNA  Renal Function:  Estimated Creatinine Clearance: 10.2 mL/min (A) (by C-G formula based on SCr of 4.49 mg/dL (H)).     Antimicrobial dosage has been changed to:   Unasyn 3 gm IV q12h for now  for Crcl 10-30 ml/min  Additional comments:   Thank you for allowing pharmacy to be a part of this patient's care.  [name] A, [facility] 06/13/2022 9:04 AM

## 2022-06-13 NOTE — Progress Notes (Signed)
PROGRESS NOTE    [name]   KGM:[id number] DOB: May 12, 1941  DOA: 06/10/2022 Date of Service: 06/13/22 PCP: Kirk Ruths, MD     Brief Narrative / Hospital Course:  [name] is a 81 y.o. Caucasian female with medical history significant for asthma, CHF, type diabetes mellitus, GERD, hypertension, dyslipidemia, CKD stage IV, hypothyroidism and OSA, who presented to the ER with acute onset of generalized weakness with associated nausea, vomiting and diarrhea.  She has been having associated chest congestion and was recently diagnosed with COVID-19 on Monday.  She was started on p.o. molnupiravir.  She took 1 dose on Tuesday and has remaining 3 doses left.  She has been vaccinated for COVID-19.   ED 09/08-early admission 09/09: Tachypneic with RR 22, 32, VSS otherwise.  Creatinine 2.4, BUN 65, comparable to previous levels.  Hypocalcemia.  Troponin 20, 19.  CXR mild patchy opacity medial right lung base, concern for pneumonia.  Treated with calcium supplementation, Zofran, 1 bolus IV normal saline, 1 dose of levofloxacin.  CT abdomen/pelvis done given nausea/vomiting and GI upset, no acute findings. Admitted to hospitalist service for COVID-19 pneumonia, DDx aspiration pneumonia.  Paxlovid, renally adjusted.  Unasyn/Zithromax for pneumonia. She is unable to keep po down, changed IV meds as able and sterted antiemetic regimen. Holding antivirals for now until can take po  09/10: Still concerned about nausea but she is tolerating p.o.  And still needing oxygen 09/11: Goal wean down O2. BCx NG x2d. Hypocalcemia, hypomagnesemia, hyperphosphatemia and AKI on CKD4 --> consult nephrology  09/12: electrolytes improving but renal function worse.  Continue IV fluid another 24 hours.   Consultants:  none  Procedures: none      ASSESSMENT & PLAN:   Principal Problem:   Pneumonia due to COVID-19 virus Active Problems:   Hypocalcemia   Right shoulder pain   Essential  hypertension   CKD (chronic kidney disease), stage IV (HCC)   Dyslipidemia   Depression   AKI (acute kidney injury) ([place])   Hypomagnesemia   Hyperphosphatemia   Pneumonia due to COVID-19 virus Ddx aspiration pneumonia. History of asthma antiviral therapy with p.o. Paxlovid will be adjusted per renal dose. Unasyn and Zithromax. Mucolytic therapy  bronchodilator therapy. vitamin C, vitamin D3 and zinc sulfate. IV normal saline. Albuterol as needed Continue home Singulair Plan wean down O2 as tolerated --> intermittently on room air   Hypocalcemia Hypomagnesemia Hyperphosphatemia  Calcium gluconate in ED, she was continued with calcium carbonate. Follow BMP --> worsening calcium, along w/ hypomag, hyperphos --> improving 09/12 Pharmacy following - appreciate assistance Nephrology consulted 09/11 and following  AKI on CKD (chronic kidney disease), stage IV ([place]) The patient had vascular access but has not had hemodialysis yet. Monitor BMP low threshold for nephrology consult if worsening --> nephrology consulted 09/11 given worsening renal function   Essential hypertension continue her antihypertensives: Amlodipine, metoprolol, spironolactone,  D/c ARB and torsemide w/ AKI Hydralaine prn HTN if off po meds   Dyslipidemia hold off statin therapy while she is on Paxlovid.  Depression continue Zoloft  Right shoulder pain secondary to fall XR shoulder personally reviewed, appears intact no fracture or dislocation, pending radiology over read pain management     DVT prophylaxis: Heparin Pertinent IV fluids/nutrition: No continuous IV at this time (no history CHF but echocardiogram 2018 did show grade 1 diastolic dysfunction) Central lines / invasive devices: none  Code Status: FULL Family Communication: none at this time  Disposition: inpatient TOC needs: home health  Barriers to discharge / significant pending items: worsening renal fxn, nephrology consulted,  treating electrolyte derangements as above              Subjective:  Patient reports tolerating diet today, no significant nausea. NO chest pain, shortness of breath. SHe is on room air.        Objective:  Vitals:   06/12/22 1158 06/12/22 1544 06/12/22 2205 06/13/22 0916  BP:  (!) 113/52 (!) 127/59 (!) 105/45  Pulse:  62 74 (!) 57  Resp:  16 18 16   Temp:  98.3 F (36.8 C) 98.7 F (37.1 C) 98.4 F (36.9 C)  TempSrc:  Oral Oral   SpO2: 98% 94% 94% 95%  Weight:      Height:        Intake/Output Summary (Last 24 hours) at 06/13/2022 1624 Last data filed at 06/13/2022 1541 Gross per 24 hour  Intake 1401.43 ml  Output 100 ml  Net 1301.43 ml   Filed Weights   06/10/22 0141  Weight: 88.5 kg    Examination:  Constitutional:  VS as above General Appearance: alert, well-developed, well-nourished, NAD Eyes: Normal lids and conjunctive, non-icteric sclera Ears, Nose, Mouth, Throat: Normal external appearance MMM Neck: No masses, trachea midline Respiratory: Normal respiratory effort No wheeze No rhonchi No rales Cardiovascular: S1/S2 normal No murmur No rub/gallop auscultated No JVD No lower extremity edema Gastrointestinal: No tenderness Musculoskeletal:  Symmetrical movement in all extremities Neurological: No cranial nerve deficit on limited exam Alert Psychiatric: Normal judgment/insight Normal mood and affect       Scheduled Medications:   amLODipine  5 mg Oral Daily   vitamin C  500 mg Oral Daily   azelastine  1 spray Each Nare BID   calcitRIOL  0.5 mcg Oral Daily   calcium carbonate  1 tablet Oral TID WC   diclofenac Sodium  2 g Topical QID   heparin  5,000 Units Subcutaneous Q8H   hydrALAZINE  100 mg Oral BID   levothyroxine  150 mcg Oral Q0600   lidocaine  1 patch Transdermal Q12H   loratadine  10 mg Oral Daily   metoprolol tartrate  50 mg Oral BID   molnupiravir EUA  4 capsule Oral BID   montelukast  10 mg Oral QHS    multivitamin with minerals  1 tablet Oral Daily   pantoprazole  40 mg Oral QAC breakfast   scopolamine  1 patch Transdermal Q72H   sertraline  100 mg Oral Daily   spironolactone  25 mg Oral Daily   zinc sulfate  220 mg Oral Daily    Continuous Infusions:  ampicillin-sulbactam (UNASYN) IV Stopped (06/13/22 1200)   doxycycline (VIBRAMYCIN) IV Stopped (06/13/22 1123)   lactated ringers Stopped (06/13/22 0923)   promethazine (PHENERGAN) injection (IM or IVPB) Stopped (06/11/22 1113)    PRN Medications:  acetaminophen, albuterol, chlorpheniramine-HYDROcodone, guaiFENesin-dextromethorphan, hydrALAZINE, HYDROcodone-acetaminophen, loperamide, polyethylene glycol, promethazine (PHENERGAN) injection (IM or IVPB)  Antimicrobials:  Anti-infectives (From admission, onward)    Start     Dose/Rate Route Frequency Ordered Stop   06/13/22 1200  Ampicillin-Sulbactam (UNASYN) 3 g in sodium chloride 0.9 % 100 mL IVPB  Status:  Discontinued        3 g 200 mL/hr over 30 Minutes Intravenous Every 24 hours 06/12/22 1242 06/13/22 0903   06/13/22 1000  Ampicillin-Sulbactam (UNASYN) 3 g in sodium chloride 0.9 % 100 mL IVPB        3 g 200 mL/hr over 30 Minutes Intravenous Every 12  hours 06/13/22 0903     06/13/22 0800  doxycycline (VIBRAMYCIN) 100 mg in sodium chloride 0.9 % 250 mL IVPB        100 mg 125 mL/hr over 120 Minutes Intravenous Every 12 hours 06/12/22 1505 06/16/22 0759   06/12/22 1230  molnupiravir EUA (LAGEVRIO) capsule 800 mg        4 capsule Oral 2 times daily 06/12/22 1132 06/17/22 0959   06/11/22 0600  azithromycin (ZITHROMAX) 500 mg in sodium chloride 0.9 % 250 mL IVPB  Status:  Discontinued        500 mg 250 mL/hr over 60 Minutes Intravenous Every 24 hours 06/10/22 0632 06/12/22 1505   06/10/22 1000  nirmatrelvir/ritonavir EUA (PAXLOVID) 3 tablet  Status:  Discontinued        3 tablet Oral 2 times daily 06/10/22 0614 06/10/22 0624   06/10/22 1000  molnupiravir EUA (LAGEVRIO) capsule 800  mg  Status:  Discontinued        4 capsule Oral 2 times daily 06/10/22 0624 06/10/22 0644   06/10/22 1000  molnupiravir EUA (LAGEVRIO) capsule 800 mg  Status:  Discontinued        4 capsule Oral 2 times daily 06/10/22 0650 06/10/22 1322   06/10/22 0800  Ampicillin-Sulbactam (UNASYN) 3 g in sodium chloride 0.9 % 100 mL IVPB  Status:  Discontinued        3 g 200 mL/hr over 30 Minutes Intravenous Every 12 hours 06/10/22 0632 06/12/22 1242   06/10/22 0645  cefTRIAXone (ROCEPHIN) 2 g in sodium chloride 0.9 % 100 mL IVPB  Status:  Discontinued        2 g 200 mL/hr over 30 Minutes Intravenous Every 24 hours 06/10/22 0632 06/10/22 0632   06/10/22 0345  levofloxacin (LEVAQUIN) IVPB 750 mg        750 mg 100 mL/hr over 90 Minutes Intravenous  Once 06/10/22 0339 06/10/22 5625       Data Reviewed: I have personally reviewed following labs and imaging studies  CBC: Recent Labs  Lab 06/08/22 1448 06/10/22 0204 06/11/22 0651 06/12/22 0913 06/13/22 0619  WBC 8.2 8.5 7.5 9.9 8.4  NEUTROABS 4.7 4.4 5.0 6.4 5.2  HGB 11.1* 10.9* 10.4* 10.4* 9.5*  HCT 35.3* 33.7* 31.8* 31.9* 29.1*  MCV 89.1 86.4 87.8 87.2 88.4  PLT 190 196 165 181 638*   Basic Metabolic Panel: Recent Labs  Lab 06/10/22 0204 06/11/22 0651 06/12/22 0912 06/12/22 0913 06/12/22 1707 06/13/22 0619  NA 139 145  --  143 142 140  K 3.8 3.2*  --  3.9 4.1 4.1  CL 108 117*  --  114* 116* 113*  CO2 16* 18*  --  16* 14* 14*  GLUCOSE 125* 106*  --  149* 93 100*  BUN 65* 52*  --  54* 55* 61*  CREATININE 2.40* 2.23*  --  3.45* 4.01* 4.49*  CALCIUM 5.7* 5.4*  --  5.5* 5.9* 6.2*  MG  --   --  <0.5*  --  1.0* 1.3*  PHOS  --   --  5.6*  --  5.8* 5.4*   GFR: Estimated Creatinine Clearance: 10.2 mL/min (A) (by C-G formula based on SCr of 4.49 mg/dL (H)). Liver Function Tests: Recent Labs  Lab 06/08/22 1448 06/10/22 0204 06/11/22 0651 06/12/22 0913 06/13/22 0619  AST 30 27 27 30 24   ALT 23 21 19 18 15   ALKPHOS 50 51 46 47 46   BILITOT 0.8 0.7 0.7 0.6 0.4  PROT 7.8  7.9 7.4 7.3 6.8  ALBUMIN 4.1 4.1 3.7 3.9 3.5   No results for input(s): "LIPASE", "AMYLASE" in the last 168 hours. No results for input(s): "AMMONIA" in the last 168 hours. Coagulation Profile: No results for input(s): "INR", "PROTIME" in the last 168 hours. Cardiac Enzymes: No results for input(s): "CKTOTAL", "CKMB", "CKMBINDEX", "TROPONINI" in the last 168 hours. BNP (last 3 results) No results for input(s): "PROBNP" in the last 8760 hours. HbA1C: No results for input(s): "HGBA1C" in the last 72 hours. CBG: Recent Labs  Lab 06/10/22 1119 06/10/22 1725 06/10/22 2201  GLUCAP 128* 101* 114*   Lipid Profile: No results for input(s): "CHOL", "HDL", "LDLCALC", "TRIG", "CHOLHDL", "LDLDIRECT" in the last 72 hours. Thyroid Function Tests: No results for input(s): "TSH", "T4TOTAL", "FREET4", "T3FREE", "THYROIDAB" in the last 72 hours. Anemia Panel: Recent Labs    06/12/22 0913 06/13/22 0619  FERRITIN 86 82   Urine analysis:    Component Value Date/Time   COLORURINE AMBER (A) 06/13/2022 0547   APPEARANCEUR CLOUDY (A) 06/13/2022 0547   APPEARANCEUR Clear 09/17/2018 1028   LABSPEC 1.015 06/13/2022 0547   LABSPEC 1.013 06/20/2012 1508   PHURINE 5.0 06/13/2022 0547   GLUCOSEU NEGATIVE 06/13/2022 0547   GLUCOSEU Negative 06/20/2012 1508   HGBUR NEGATIVE 06/13/2022 0547   BILIRUBINUR NEGATIVE 06/13/2022 0547   BILIRUBINUR Negative 09/17/2018 1028   BILIRUBINUR Negative 06/20/2012 1508   KETONESUR NEGATIVE 06/13/2022 0547   PROTEINUR NEGATIVE 06/13/2022 0547   NITRITE NEGATIVE 06/13/2022 0547   LEUKOCYTESUR NEGATIVE 06/13/2022 0547   LEUKOCYTESUR Negative 06/20/2012 1508   Sepsis Labs: @LABRCNTIP (procalcitonin:4,lacticidven:4)  Recent Results (from the past 240 hour(s))  Resp Panel by RT-PCR (Flu A&B, Covid) Anterior Nasal Swab     Status: Abnormal   Collection Time: 06/08/22  2:37 PM   Specimen: Anterior Nasal Swab  Result Value  Ref Range Status   SARS Coronavirus 2 by RT PCR POSITIVE (A) NEGATIVE Final    Comment: (NOTE) SARS-CoV-2 target nucleic acids are DETECTED.  The SARS-CoV-2 RNA is generally detectable in upper respiratory specimens during the acute phase of infection. Positive results are indicative of the presence of the identified virus, but do not rule out bacterial infection or co-infection with other pathogens not detected by the test. Clinical correlation with patient history and other diagnostic information is necessary to determine patient infection status. The expected result is Negative.  Fact Sheet for Patients: EntrepreneurPulse.com.au  Fact Sheet for Healthcare Providers: IncredibleEmployment.be  This test is not yet approved or cleared by the [place] FDA and  has been authorized for detection and/or diagnosis of SARS-CoV-2 by FDA under an Emergency Use Authorization (EUA).  This EUA will remain in effect (meaning this test can be used) for the duration of  the COVID-19 declaration under Section 564(b)(1) of the A ct, 21 U.S.C. section 360bbb-3(b)(1), unless the authorization is terminated or revoked sooner.     Influenza A by PCR NEGATIVE NEGATIVE Final   Influenza B by PCR NEGATIVE NEGATIVE Final    Comment: (NOTE) The Xpert Xpress SARS-CoV-2/FLU/RSV plus assay is intended as an aid in the diagnosis of influenza from Nasopharyngeal swab specimens and should not be used as a sole basis for treatment. Nasal washings and aspirates are unacceptable for Xpert Xpress SARS-CoV-2/FLU/RSV testing.  Fact Sheet for Patients: EntrepreneurPulse.com.au  Fact Sheet for Healthcare Providers: IncredibleEmployment.be  This test is not yet approved or cleared by the [place] FDA and has been authorized for detection and/or diagnosis of SARS-CoV-2 by FDA  under an Emergency Use Authorization (EUA). This EUA will  remain in effect (meaning this test can be used) for the duration of the COVID-19 declaration under Section 564(b)(1) of the Act, 21 U.S.C. section 360bbb-3(b)(1), unless the authorization is terminated or revoked.  Performed at [facility], [address] [place], [place] [address]   Culture, blood (routine x 2)     Status: None (Preliminary result)   Collection Time: 06/10/22  2:04 AM   Specimen: BLOOD RIGHT ARM  Result Value Ref Range Status   Specimen Description BLOOD RIGHT ARM  Final   Special Requests   Final    BOTTLES DRAWN AEROBIC AND ANAEROBIC Blood Culture adequate volume   Culture   Final    NO GROWTH 3 DAYS Performed at [facility], [address] [place], [place] [address]    Report Status PENDING  Incomplete  Culture, blood (routine x 2)     Status: None (Preliminary result)   Collection Time: 06/10/22  2:04 AM   Specimen: BLOOD RIGHT ARM  Result Value Ref Range Status   Specimen Description BLOOD RIGHT ARM  Final   Special Requests   Final    BOTTLES DRAWN AEROBIC AND ANAEROBIC Blood Culture results may not be optimal due to an inadequate volume of blood received in culture bottles   Culture   Final    NO GROWTH 3 DAYS Performed at [facility], [address] [place], [place] [address]    Report Status PENDING  Incomplete         Radiology Studies: DG Shoulder Right Port  Result Date: 06/10/2022 CLINICAL DATA:  Recent fall.  Shoulder pain. EXAM: RIGHT SHOULDER - 1 VIEW COMPARISON:  No comparison studies available. FINDINGS: Bones are demineralized. No fracture or evidence of dislocation. Degenerative changes are seen in the glenohumeral joint. Calcific tendinitis noted at the rotator cuff insertion. IMPRESSION: 1. No acute bony abnormality. 2. Degenerative changes in the glenohumeral joint. 3. Calcific tendinitis. Electronically Signed   By: Misty Stanley M.D.   On: 06/10/2022 07:45   CT ABDOMEN PELVIS WO  CONTRAST  Result Date: 06/10/2022 CLINICAL DATA:  Weakness, nausea/vomiting, COVID EXAM: CT ABDOMEN AND PELVIS WITHOUT CONTRAST TECHNIQUE: Multidetector CT imaging of the abdomen and pelvis was performed following the standard protocol without IV contrast. RADIATION DOSE REDUCTION: This exam was performed according to the departmental dose-optimization program which includes automated exposure control, adjustment of the mA and/or kV according to patient size and/or use of iterative reconstruction technique. COMPARISON:  12/29/2016 FINDINGS: Lower chest: Lung bases are clear. Hepatobiliary: Unenhanced liver is unremarkable. Status post cholecystectomy. No intrahepatic or extrahepatic ductal dilatation. Pancreas: Within normal limits. Spleen: Within normal limits. Adrenals/Urinary Tract: Adrenal glands are within normal limits. Right renal cysts, including a dominant 6.3 cm cyst in the right lower kidney (series 2/image 27), measuring simple fluid density, benign (Bosniak I). Left kidney is within normal limits. No hydronephrosis. Bladder is within normal limits. Stomach/Bowel: Stomach is within normal limits. No evidence of bowel obstruction. Appendix is not discretely visualized. Sigmoid diverticulosis, without evidence of diverticulitis. Vascular/Lymphatic: No evidence of abdominal aortic aneurysm. Atherosclerotic calcifications of the abdominal aorta and branch vessels. No suspicious abdominopelvic lymphadenopathy. Reproductive: Status post hysterectomy. Bilateral ovaries are within normal limits. Other: No abdominopelvic ascites. Musculoskeletal: Mild degenerative changes of the visualized thoracolumbar spine. IMPRESSION: Sigmoid diverticulosis, without evidence of diverticulitis. Additional ancillary findings as above Electronically Signed   By: Julian Hy M.D.   On: 06/10/2022 02:57  DG Chest Port 1 View  Result Date: 06/10/2022 CLINICAL DATA:  Shortness of breath EXAM: PORTABLE CHEST 1 VIEW  COMPARISON:  06/08/2022 FINDINGS: Mild patchy opacity at the medial right lung base. Left lung is clear. No pleural effusion or pneumothorax. Cardiomegaly.  Thoracic aortic atherosclerosis. Visualized osseous structures are within normal limits. IMPRESSION: Mild patchy opacity at the medial right lung base, raising the possibility of mild infection/pneumonia. Electronically Signed   By: Julian Hy M.D.   On: 06/10/2022 02:36            LOS: 3 days     Emeterio Reeve, DO [facility] 06/13/2022, 4:24 PM   Staff may message me via secure chat in [place]  but this may not receive immediate response,  please page for urgent matters!  If 7PM-7AM, please contact night-coverage [email or web address]  Dictation software was used to generate the above note. Typos may occur and escape review, as with typed/written notes. Please contact Dr Sheppard Coil directly for clarity if needed.

## 2022-06-13 NOTE — Progress Notes (Addendum)
[facility]  ROUNDING NOTE   Subjective:   [name] is a 81 year old female with medical concerns including GERD, diabetes, hypertension, dyslipidemia, hypothyroidism, and chronic kidney disease stage IV.  Patient presents to the emergency department complaining of shortness of breath and has been admitted for Hypocalcemia [E83.51] Generalized weakness [R53.1] Nausea vomiting and diarrhea [R11.2, R19.7] Pneumonia due to COVID-19 virus [U07.1, J12.82] COVID-19 [U07.1]  Patient is known to our practice and is followed outpatient by Dr. Juleen China.    Patient seen resting well, husband at bedside Tolerating meals, denies nausea and vomiting Remains on room air   Objective:  Vital signs in last 24 hours:  Temp:  [98.3 F (36.8 C)-98.7 F (37.1 C)] 98.4 F (36.9 C) (09/12 0916) Pulse Rate:  [57-74] 57 (09/12 0916) Resp:  [16-18] 16 (09/12 0916) BP: (105-127)/(45-59) 105/45 (09/12 0916) SpO2:  [94 %-95 %] 95 % (09/12 0916)  Weight change:  Filed Weights   06/10/22 0141  Weight: 88.5 kg    Intake/Output: I/O last 3 completed shifts: In: 545.7 [I.V.:545.7] Out: 100 [Urine:100]   Intake/Output this shift:  Total I/O In: 455.8 [I.V.:455.8] Out: -   Physical Exam: General: NAD, resting comfortably  Head: Normocephalic, atraumatic. Moist oral mucosal membranes  Eyes: Anicteric  Lungs:  Clear to auscultation, normal effort, room air  Heart: Regular rate and rhythm  Abdomen:  Soft, nontender, obese  Extremities: No peripheral edema.  Neurologic: Nonfocal, moving all four extremities  Skin: No lesions, dry lower extremities  Access: None    Basic Metabolic Panel: Recent Labs  Lab 06/10/22 0204 06/11/22 0651 06/12/22 0912 06/12/22 0913 06/12/22 1707 06/13/22 0619  NA 139 145  --  143 142 140  K 3.8 3.2*  --  3.9 4.1 4.1  CL 108 117*  --  114* 116* 113*  CO2 16* 18*  --  16* 14* 14*  GLUCOSE 125* 106*  --  149* 93 100*  BUN 65* 52*  --  54* 55*  61*  CREATININE 2.40* 2.23*  --  3.45* 4.01* 4.49*  CALCIUM 5.7* 5.4*  --  5.5* 5.9* 6.2*  MG  --   --  <0.5*  --  1.0* 1.3*  PHOS  --   --  5.6*  --  5.8* 5.4*     Liver Function Tests: Recent Labs  Lab 06/08/22 1448 06/10/22 0204 06/11/22 0651 06/12/22 0913 06/13/22 0619  AST 30 27 27 30 24   ALT 23 21 19 18 15   ALKPHOS 50 51 46 47 46  BILITOT 0.8 0.7 0.7 0.6 0.4  PROT 7.8 7.9 7.4 7.3 6.8  ALBUMIN 4.1 4.1 3.7 3.9 3.5    No results for input(s): "LIPASE", "AMYLASE" in the last 168 hours. No results for input(s): "AMMONIA" in the last 168 hours.  CBC: Recent Labs  Lab 06/08/22 1448 06/10/22 0204 06/11/22 0651 06/12/22 0913 06/13/22 0619  WBC 8.2 8.5 7.5 9.9 8.4  NEUTROABS 4.7 4.4 5.0 6.4 5.2  HGB 11.1* 10.9* 10.4* 10.4* 9.5*  HCT 35.3* 33.7* 31.8* 31.9* 29.1*  MCV 89.1 86.4 87.8 87.2 88.4  PLT 190 196 165 181 142*     Cardiac Enzymes: No results for input(s): "CKTOTAL", "CKMB", "CKMBINDEX", "TROPONINI" in the last 168 hours.  BNP: Invalid input(s): "POCBNP"  CBG: Recent Labs  Lab 06/10/22 1119 06/10/22 1725 06/10/22 2201  GLUCAP 128* 101* 114*     Microbiology: Results for orders placed or performed during the hospital encounter of 06/10/22  Culture, blood (routine  x 2)     Status: None (Preliminary result)   Collection Time: 06/10/22  2:04 AM   Specimen: BLOOD RIGHT ARM  Result Value Ref Range Status   Specimen Description BLOOD RIGHT ARM  Final   Special Requests   Final    BOTTLES DRAWN AEROBIC AND ANAEROBIC Blood Culture adequate volume   Culture   Final    NO GROWTH 3 DAYS Performed at [facility], [address], [place], [place] [address]    Report Status PENDING  Incomplete  Culture, blood (routine x 2)     Status: None (Preliminary result)   Collection Time: 06/10/22  2:04 AM   Specimen: BLOOD RIGHT ARM  Result Value Ref Range Status   Specimen Description BLOOD RIGHT ARM  Final   Special Requests   Final    BOTTLES  DRAWN AEROBIC AND ANAEROBIC Blood Culture results may not be optimal due to an inadequate volume of blood received in culture bottles   Culture   Final    NO GROWTH 3 DAYS Performed at [facility], [address], [place], [place] [address]    Report Status PENDING  Incomplete    Coagulation Studies: No results for input(s): "LABPROT", "INR" in the last 72 hours.  Urinalysis: Recent Labs    06/13/22 0547  COLORURINE AMBER*  LABSPEC 1.015  PHURINE 5.0  GLUCOSEU NEGATIVE  HGBUR NEGATIVE  BILIRUBINUR NEGATIVE  KETONESUR NEGATIVE  PROTEINUR NEGATIVE  NITRITE NEGATIVE  LEUKOCYTESUR NEGATIVE      Imaging: No results found.   Medications:    ampicillin-sulbactam (UNASYN) IV 3 g (06/13/22 1130)   doxycycline (VIBRAMYCIN) IV 100 mg (06/13/22 3244)   lactated ringers Stopped (06/13/22 0923)   promethazine (PHENERGAN) injection (IM or IVPB) Stopped (06/11/22 1113)    amLODipine  5 mg Oral Daily   vitamin C  500 mg Oral Daily   azelastine  1 spray Each Nare BID   calcitRIOL  0.5 mcg Oral Daily   calcium carbonate  1 tablet Oral TID WC   diclofenac Sodium  2 g Topical QID   heparin  5,000 Units Subcutaneous Q8H   hydrALAZINE  100 mg Oral BID   levothyroxine  150 mcg Oral Q0600   lidocaine  1 patch Transdermal Q12H   loratadine  10 mg Oral Daily   metoprolol tartrate  50 mg Oral BID   molnupiravir EUA  4 capsule Oral BID   montelukast  10 mg Oral QHS   multivitamin with minerals  1 tablet Oral Daily   pantoprazole  40 mg Oral QAC breakfast   scopolamine  1 patch Transdermal Q72H   sertraline  100 mg Oral Daily   spironolactone  25 mg Oral Daily   zinc sulfate  220 mg Oral Daily   acetaminophen, albuterol, chlorpheniramine-HYDROcodone, guaiFENesin-dextromethorphan, hydrALAZINE, HYDROcodone-acetaminophen, loperamide, polyethylene glycol, promethazine (PHENERGAN) injection (IM or IVPB)  Assessment/ Plan:  [name] is a 81 y.o.  female with medical  concerns including GERD, diabetes, hypertension, dyslipidemia, hypothyroidism, and chronic kidney disease stage IV.  Patient presents to the emergency department complaining of shortness of breath and has been admitted for Hypocalcemia [E83.51] Generalized weakness [R53.1] Nausea vomiting and diarrhea [R11.2, R19.7] Pneumonia due to COVID-19 virus [U07.1, J12.82] COVID-19 [U07.1]   Acute Kidney Injury on chronic kidney disease stage 4 with baseline creatinine 2.7 and GFR of 17 on 05/09/2022.  Acute kidney injury secondary to hypovolemia from poor oral intake. Chronic kidney disease is secondary to advanced  age, hypertension and diabetes CT abdomen pelvis shows multiple renal cysts in right kidney, left kidney within normal limits with no obstruction.  Creatinine continues to rise, little urine output recorded. Continue IVF for another 24 hours.   Lab Results  Component Value Date   CREATININE 4.49 (H) 06/13/2022   CREATININE 4.01 (H) 06/12/2022   CREATININE 3.45 (H) 06/12/2022    Intake/Output Summary (Last 24 hours) at 06/13/2022 1446 Last data filed at 06/13/2022 1021 Gross per 24 hour  Intake 1001.43 ml  Output 100 ml  Net 901.43 ml    2. Hypocalcemia Calcium 5.7 on admission. Continue IV and oral supplementation by primary team. Calcium slowly correcting.  3. Anemia of chronic kidney disease Lab Results  Component Value Date   HGB 9.5 (L) 06/13/2022    Hgb at goal.  4. Hypertension with chronic kidney disease. Home regimen includes amlodipine, hydralazine, losartan, metoprolol, spironolactone, and torsemide. Torsemide and losartan held.    5. Diabetes mellitus type II with chronic kidney disease/renal manifestations: noninsulin dependent.Most recent hemoglobin A1c is 6.4 on 08/24/29.     LOS: 3 Shantelle Breeze 9/12/20232:46 PM   Patient was examined and evaluated with Colon Flattery, NP.  Plan of care was formulated and discussed with patient as well as NP.  I agree  with the note as documented with edits.  Patient with severe ATN secondary to concurrent infection.  She also has severe hypocalcemia and is getting IV and oral calcium supplementation in addition to Rocaltrol. Continue supportive care.  No acute indication for dialysis at present.  Her appetite has improved today.  Hopefully we will see improvement in lab parameters tomorrow.

## 2022-06-14 DIAGNOSIS — U071 COVID-19: Secondary | ICD-10-CM | POA: Diagnosis not present

## 2022-06-14 DIAGNOSIS — J1282 Pneumonia due to coronavirus disease 2019: Secondary | ICD-10-CM | POA: Diagnosis not present

## 2022-06-14 LAB — C-REACTIVE PROTEIN: CRP: 6.3 mg/dL — ABNORMAL HIGH (ref <1.0)

## 2022-06-14 LAB — GLUCOSE, CAPILLARY
Glucose-Capillary: 109 mg/dL — ABNORMAL HIGH (ref 70–99)
Glucose-Capillary: 118 mg/dL — ABNORMAL HIGH (ref 70–99)
Glucose-Capillary: 118 mg/dL — ABNORMAL HIGH (ref 70–99)

## 2022-06-14 LAB — COMPREHENSIVE METABOLIC PANEL
ALT: 13 U/L (ref 0–44)
AST: 22 U/L (ref 15–41)
Albumin: 3.2 g/dL — ABNORMAL LOW (ref 3.5–5.0)
Alkaline Phosphatase: 50 U/L (ref 38–126)
Anion gap: 11 (ref 5–15)
BUN: 66 mg/dL — ABNORMAL HIGH (ref 8–23)
CO2: 15 mmol/L — ABNORMAL LOW (ref 22–32)
Calcium: 6.9 mg/dL — ABNORMAL LOW (ref 8.9–10.3)
Chloride: 112 mmol/L — ABNORMAL HIGH (ref 98–111)
Creatinine, Ser: 4.54 mg/dL — ABNORMAL HIGH (ref 0.44–1.00)
GFR, Estimated: 9 mL/min — ABNORMAL LOW (ref >60)
Glucose, Bld: 100 mg/dL — ABNORMAL HIGH (ref 70–99)
Potassium: 4.6 mmol/L (ref 3.5–5.1)
Sodium: 138 mmol/L (ref 135–145)
Total Bilirubin: 0.7 mg/dL (ref 0.3–1.2)
Total Protein: 6.6 g/dL (ref 6.5–8.1)

## 2022-06-14 LAB — CBC WITH DIFFERENTIAL/PLATELET
Abs Immature Granulocytes: 0.08 10*3/uL — ABNORMAL HIGH (ref 0.00–0.07)
Basophils Absolute: 0.1 10*3/uL (ref 0.0–0.1)
Basophils Relative: 1 %
Eosinophils Absolute: 0.4 10*3/uL (ref 0.0–0.5)
Eosinophils Relative: 5 %
HCT: 29.8 % — ABNORMAL LOW (ref 36.0–46.0)
Hemoglobin: 9.5 g/dL — ABNORMAL LOW (ref 12.0–15.0)
Immature Granulocytes: 1 %
Lymphocytes Relative: 27 %
Lymphs Abs: 2.2 10*3/uL (ref 0.7–4.0)
MCH: 28.3 pg (ref 26.0–34.0)
MCHC: 31.9 g/dL (ref 30.0–36.0)
MCV: 88.7 fL (ref 80.0–100.0)
Monocytes Absolute: 0.7 10*3/uL (ref 0.1–1.0)
Monocytes Relative: 8 %
Neutro Abs: 5 10*3/uL (ref 1.7–7.7)
Neutrophils Relative %: 58 %
Platelets: 160 10*3/uL (ref 150–400)
RBC: 3.36 MIL/uL — ABNORMAL LOW (ref 3.87–5.11)
RDW: 14.5 % (ref 11.5–15.5)
WBC: 8.4 10*3/uL (ref 4.0–10.5)
nRBC: 0 % (ref 0.0–0.2)

## 2022-06-14 LAB — HEMOGLOBIN A1C
Hgb A1c MFr Bld: 6 % — ABNORMAL HIGH (ref 4.8–5.6)
Mean Plasma Glucose: 125.5 mg/dL

## 2022-06-14 LAB — URINE CULTURE: Culture: NO GROWTH

## 2022-06-14 LAB — MAGNESIUM: Magnesium: 2.2 mg/dL (ref 1.7–2.4)

## 2022-06-14 LAB — PHOSPHORUS: Phosphorus: 6.1 mg/dL — ABNORMAL HIGH (ref 2.5–4.6)

## 2022-06-14 LAB — FERRITIN: Ferritin: 106 ng/mL (ref 11–307)

## 2022-06-14 MED ORDER — IPRATROPIUM-ALBUTEROL 20-100 MCG/ACT IN AERS
1.0000 | INHALATION_SPRAY | Freq: Four times a day (QID) | RESPIRATORY_TRACT | Status: DC
  Administered 2022-06-14 – 2022-06-21 (×28): 1 via RESPIRATORY_TRACT
  Filled 2022-06-14 (×2): qty 4

## 2022-06-14 MED ORDER — INSULIN ASPART 100 UNIT/ML IJ SOLN
0.0000 [IU] | Freq: Three times a day (TID) | INTRAMUSCULAR | Status: DC
  Administered 2022-06-17 – 2022-06-21 (×4): 1 [IU] via SUBCUTANEOUS
  Filled 2022-06-14 (×4): qty 1

## 2022-06-14 MED ORDER — TRAZODONE HCL 50 MG PO TABS: 50.0000 mg | ORAL_TABLET | Freq: Every evening | ORAL | Status: DC | PRN

## 2022-06-14 MED ORDER — METOPROLOL TARTRATE 5 MG/5ML IV SOLN: 5.0000 mg | INTRAVENOUS | Status: DC | PRN

## 2022-06-14 MED ORDER — HYDRALAZINE HCL 20 MG/ML IJ SOLN
10.0000 mg | INTRAMUSCULAR | Status: DC | PRN
  Filled 2022-06-14: qty 1

## 2022-06-14 MED ORDER — SENNOSIDES-DOCUSATE SODIUM 8.6-50 MG PO TABS: 1.0000 | ORAL_TABLET | Freq: Every evening | ORAL | Status: DC | PRN

## 2022-06-14 NOTE — Progress Notes (Signed)
PT Cancellation Note  Patient Details Name: [name] MRN: [id number] DOB: [birth year]   Cancelled Treatment:    Reason Eval/Treat Not Completed: Patient declined, no reason specified. Pt declined 2/2 fatigue    Bertine Schlottman A Elyn Krogh 06/14/2022, 3:43 PM

## 2022-06-14 NOTE — Plan of Care (Signed)
Patient sleeping between care. Aox4. Patient reports moderate to severe right sided rib pain. Oxygen on at 2L [place]. Spouse at bedside. Bed alarm on. Call bell within reach.   Problem: Education: Goal: Knowledge of risk factors and measures for prevention of condition will improve Outcome: Progressing   Problem: Coping: Goal: Psychosocial and spiritual needs will be supported Outcome: Progressing   Problem: Respiratory: Goal: Will maintain a patent airway Outcome: Progressing Goal: Complications related to the disease process, condition or treatment will be avoided or minimized Outcome: Progressing   Problem: Education: Goal: Knowledge of General Education information will improve Description: Including pain rating scale, medication(s)/side effects and non-pharmacologic comfort measures Outcome: Progressing   Problem: Health Behavior/Discharge Planning: Goal: Ability to manage health-related needs will improve Outcome: Progressing   Problem: Clinical Measurements: Goal: Ability to maintain clinical measurements within normal limits will improve Outcome: Progressing Goal: Will remain free from infection Outcome: Progressing Goal: Diagnostic test results will improve Outcome: Progressing Goal: Respiratory complications will improve Outcome: Progressing Goal: Cardiovascular complication will be avoided Outcome: Progressing   Problem: Activity: Goal: Risk for activity intolerance will decrease Outcome: Progressing   Problem: Nutrition: Goal: Adequate nutrition will be maintained Outcome: Progressing   Problem: Coping: Goal: Level of anxiety will decrease Outcome: Progressing   Problem: Elimination: Goal: Will not experience complications related to bowel motility Outcome: Progressing Goal: Will not experience complications related to urinary retention Outcome: Progressing   Problem: Pain Managment: Goal: General experience of comfort will improve Outcome:  Progressing   Problem: Safety: Goal: Ability to remain free from injury will improve Outcome: Progressing   Problem: Skin Integrity: Goal: Risk for impaired skin integrity will decrease Outcome: Progressing

## 2022-06-14 NOTE — Progress Notes (Signed)
PROGRESS NOTE    [name]  ZHY:[id number] DOB: [birth year] DOA: 06/10/2022 PCP: Kirk Ruths, MD   Brief Narrative:  81 y.o. Caucasian female with medical history significant for asthma, CHF, type diabetes mellitus, GERD, hypertension, dyslipidemia, CKD stage IV, hypothyroidism and OSA, who presented to the ER with acute onset of generalized weakness with associated nausea, vomiting and diarrhea. She has been having associated chest congestion and was recently diagnosed with COVID-19 on Monday.  She was started on p.o. molnupiravir.  Upon admission patient was noted to have worsening renal function concerns for ATN, nephrology team was consulted.  She had multiple electrolyte derangements requiring repletion.   Assessment & Plan:  Principal Problem:   Pneumonia due to COVID-19 virus Active Problems:   Hypocalcemia   Right shoulder pain   Essential hypertension   CKD (chronic kidney disease), stage IV (HCC)   Dyslipidemia   Depression   AKI (acute kidney injury) ([place])   Hypomagnesemia   Hyperphosphatemia     Assessment and Plan: * Pneumonia due to COVID-19 virus Concerns of superimposed aspiration pneumonia due to nausea and vomiting.  Currently on Molnupiravir continue and finish the course.  We will do total 5-day course of Unasyn and aDoxycyline.  Bronchodilators/I-S/flutter valve.  Supportive care.   AKI on CKD (chronic kidney disease), stage IV (HCC) - Cr Continues to climb up. Suspect ATN. Nephro is following. Getting IVF. Bedside bladder scan does not show any acute urinary retention.  For Stop Aldactone.  Start Na Bicarb? Will defer to nephro.   DM2 ISS and accuchecks.   Hypocalcemia -Prn repletion. Not on home meds.   Right shoulder pain - X-ray showing calcified tendinitis.  Pain control  Depression - On Zoloft  Dyslipidemia - Statin on hold while on Paxlovid   Essential hypertension - We will continue her antihypertensives.    DVT  prophylaxis: SQ Heparin Code Status: Full Family Communication: Significant other at bedside  Status is: Inpatient Creatinine still out of range.  Ongoing management  Subjective: Seen and examined at bedside.  Overall feels very weak.  Also tells me right status the flank hurts from laying in the bed.    Examination:  General exam: Appears calm and comfortable  Respiratory system: Mild bibasilar crackles Cardiovascular system: S1 & S2 heard, RRR. No JVD, murmurs, rubs, gallops or clicks. No pedal edema. Gastrointestinal system: Abdomen is nondistended, soft and nontender. No organomegaly or masses felt. Normal bowel sounds heard. Central nervous system: Alert and oriented. No focal neurological deficits. Extremities: Symmetric 4 x 5 power. Skin: No rashes, lesions or ulcers Psychiatry: Judgement and insight appear normal. Mood & affect appropriate.     Objective: Vitals:   06/13/22 0916 06/13/22 2017 06/14/22 0550 06/14/22 0840  BP: (!) 105/45 (!) 110/45 127/67 (!) 111/57  Pulse: (!) 57 64 (!) 57 60  Resp: 16 20 20 20   Temp: 98.4 F (36.9 C) 98.1 F (36.7 C) 98.1 F (36.7 C) 98 F (36.7 C)  TempSrc:  Oral Axillary Oral  SpO2: 95% 96% 98% 97%  Weight:      Height:        Intake/Output Summary (Last 24 hours) at 06/14/2022 0905 Last data filed at 06/14/2022 0630 Gross per 24 hour  Intake 1785.76 ml  Output 300 ml  Net 1485.76 ml   Filed Weights   06/10/22 0141  Weight: 88.5 kg     Data Reviewed:   CBC: Recent Labs  Lab 06/10/22 0204 06/11/22 0651 06/12/22 0913 06/13/22  1572 06/14/22 0506  WBC 8.5 7.5 9.9 8.4 8.4  NEUTROABS 4.4 5.0 6.4 5.2 5.0  HGB 10.9* 10.4* 10.4* 9.5* 9.5*  HCT 33.7* 31.8* 31.9* 29.1* 29.8*  MCV 86.4 87.8 87.2 88.4 88.7  PLT 196 165 181 142* 620   Basic Metabolic Panel: Recent Labs  Lab 06/12/22 0912 06/12/22 0913 06/12/22 1707 06/13/22 0619 06/13/22 1812 06/14/22 0506  NA  --  143 142 140 137 138  K  --  3.9 4.1 4.1 4.5  4.6  CL  --  114* 116* 113* 109 112*  CO2  --  16* 14* 14* 16* 15*  GLUCOSE  --  149* 93 100* 104* 100*  BUN  --  54* 55* 61* 63* 66*  CREATININE  --  3.45* 4.01* 4.49* 4.80* 4.54*  CALCIUM  --  5.5* 5.9* 6.2* 6.6* 6.9*  MG <0.5*  --  1.0* 1.3* 1.1* 2.2  PHOS 5.6*  --  5.8* 5.4* 5.8* 6.1*   GFR: Estimated Creatinine Clearance: 10 mL/min (A) (by C-G formula based on SCr of 4.54 mg/dL (H)). Liver Function Tests: Recent Labs  Lab 06/10/22 0204 06/11/22 0651 06/12/22 0913 06/13/22 0619 06/13/22 1812 06/14/22 0506  AST 27 27 30 24   --  22  ALT 21 19 18 15   --  13  ALKPHOS 51 46 47 46  --  50  BILITOT 0.7 0.7 0.6 0.4  --  0.7  PROT 7.9 7.4 7.3 6.8  --  6.6  ALBUMIN 4.1 3.7 3.9 3.5 3.1* 3.2*   No results for input(s): "LIPASE", "AMYLASE" in the last 168 hours. No results for input(s): "AMMONIA" in the last 168 hours. Coagulation Profile: No results for input(s): "INR", "PROTIME" in the last 168 hours. Cardiac Enzymes: No results for input(s): "CKTOTAL", "CKMB", "CKMBINDEX", "TROPONINI" in the last 168 hours. BNP (last 3 results) No results for input(s): "PROBNP" in the last 8760 hours. HbA1C: No results for input(s): "HGBA1C" in the last 72 hours. CBG: Recent Labs  Lab 06/10/22 1119 06/10/22 1725 06/10/22 2201  GLUCAP 128* 101* 114*   Lipid Profile: No results for input(s): "CHOL", "HDL", "LDLCALC", "TRIG", "CHOLHDL", "LDLDIRECT" in the last 72 hours. Thyroid Function Tests: No results for input(s): "TSH", "T4TOTAL", "FREET4", "T3FREE", "THYROIDAB" in the last 72 hours. Anemia Panel: Recent Labs    06/13/22 0619 06/14/22 0506  FERRITIN 82 106   Sepsis Labs: Recent Labs  Lab 06/10/22 0204  PROCALCITON <0.10  LATICACIDVEN 1.0    Recent Results (from the past 240 hour(s))  Resp Panel by RT-PCR (Flu A&B, Covid) Anterior Nasal Swab     Status: Abnormal   Collection Time: 06/08/22  2:37 PM   Specimen: Anterior Nasal Swab  Result Value Ref Range Status   SARS  Coronavirus 2 by RT PCR POSITIVE (A) NEGATIVE Final    Comment: (NOTE) SARS-CoV-2 target nucleic acids are DETECTED.  The SARS-CoV-2 RNA is generally detectable in upper respiratory specimens during the acute phase of infection. Positive results are indicative of the presence of the identified virus, but do not rule out bacterial infection or co-infection with other pathogens not detected by the test. Clinical correlation with patient history and other diagnostic information is necessary to determine patient infection status. The expected result is Negative.  Fact Sheet for Patients: EntrepreneurPulse.com.au  Fact Sheet for Healthcare Providers: IncredibleEmployment.be  This test is not yet approved or cleared by the [place] FDA and  has been authorized for detection and/or diagnosis of SARS-CoV-2 by FDA under  an Emergency Use Authorization (EUA).  This EUA will remain in effect (meaning this test can be used) for the duration of  the COVID-19 declaration under Section 564(b)(1) of the A ct, 21 U.S.C. section 360bbb-3(b)(1), unless the authorization is terminated or revoked sooner.     Influenza A by PCR NEGATIVE NEGATIVE Final   Influenza B by PCR NEGATIVE NEGATIVE Final    Comment: (NOTE) The Xpert Xpress SARS-CoV-2/FLU/RSV plus assay is intended as an aid in the diagnosis of influenza from Nasopharyngeal swab specimens and should not be used as a sole basis for treatment. Nasal washings and aspirates are unacceptable for Xpert Xpress SARS-CoV-2/FLU/RSV testing.  Fact Sheet for Patients: EntrepreneurPulse.com.au  Fact Sheet for Healthcare Providers: IncredibleEmployment.be  This test is not yet approved or cleared by the [place] FDA and has been authorized for detection and/or diagnosis of SARS-CoV-2 by FDA under an Emergency Use Authorization (EUA). This EUA will remain in effect (meaning  this test can be used) for the duration of the COVID-19 declaration under Section 564(b)(1) of the Act, 21 U.S.C. section 360bbb-3(b)(1), unless the authorization is terminated or revoked.  Performed at [facility], [address] [place], [place] [address]   Culture, blood (routine x 2)     Status: None (Preliminary result)   Collection Time: 06/10/22  2:04 AM   Specimen: BLOOD RIGHT ARM  Result Value Ref Range Status   Specimen Description BLOOD RIGHT ARM  Final   Special Requests   Final    BOTTLES DRAWN AEROBIC AND ANAEROBIC Blood Culture adequate volume   Culture   Final    NO GROWTH 4 DAYS Performed at [facility], [address] [place], [place] [address]    Report Status PENDING  Incomplete  Culture, blood (routine x 2)     Status: None (Preliminary result)   Collection Time: 06/10/22  2:04 AM   Specimen: BLOOD RIGHT ARM  Result Value Ref Range Status   Specimen Description BLOOD RIGHT ARM  Final   Special Requests   Final    BOTTLES DRAWN AEROBIC AND ANAEROBIC Blood Culture results may not be optimal due to an inadequate volume of blood received in culture bottles   Culture   Final    NO GROWTH 4 DAYS Performed at [facility], [address] [place], [place] [address]    Report Status PENDING  Incomplete         Radiology Studies: No results found.      Scheduled Meds:  amLODipine  5 mg Oral Daily   vitamin C  500 mg Oral Daily   azelastine  1 spray Each Nare BID   calcitRIOL  0.5 mcg Oral Daily   calcium carbonate  1 tablet Oral TID WC   diclofenac Sodium  2 g Topical QID   heparin  5,000 Units Subcutaneous Q8H   hydrALAZINE  100 mg Oral BID   insulin aspart  0-9 Units Subcutaneous TID WC   Ipratropium-Albuterol  1 puff Inhalation Q6H   levothyroxine  150 mcg Oral Q0600   lidocaine  1 patch Transdermal Q12H   loratadine  10 mg Oral Daily   metoprolol tartrate  50 mg Oral BID   molnupiravir EUA  4 capsule Oral  BID   montelukast  10 mg Oral QHS   multivitamin with minerals  1 tablet Oral Daily   pantoprazole  40 mg Oral QAC breakfast   scopolamine  1 patch Transdermal Q72H   sertraline  100 mg Oral Daily   spironolactone  25 mg Oral Daily   zinc sulfate  220 mg Oral Daily   Continuous Infusions:  ampicillin-sulbactam (UNASYN) IV 3 g (06/14/22 0004)   doxycycline (VIBRAMYCIN) IV 100 mg (06/14/22 0805)   lactated ringers 75 mL/hr at 06/13/22 2019   promethazine (PHENERGAN) injection (IM or IVPB) Stopped (06/11/22 1113)     LOS: 4 days   Time spent= 35 mins    Breella Vanostrand Arsenio Loader, MD [facility]  If 7PM-7AM, please contact night-coverage  06/14/2022, 9:05 AM

## 2022-06-14 NOTE — Progress Notes (Addendum)
Pharmacy Electrolyte Monitoring Consult:  Pharmacy consulted to assist in monitoring and replacing electrolytes in this 81 y.o. female admitted on 06/10/2022 with Shortness of Breath Covid+  Labs:  Sodium (mmol/L)  Date Value  06/14/2022 138  08/11/2014 141  06/20/2012 142   Potassium (mmol/L)  Date Value  06/14/2022 4.6  06/20/2012 3.2 (L)   Magnesium (mg/dL)  Date Value  06/14/2022 2.2   Phosphorus (mg/dL)  Date Value  06/14/2022 6.1 (H)   Calcium (mg/dL)  Date Value  06/14/2022 6.9 (L)   Calcium, Total (mg/dL)  Date Value  06/20/2012 9.4   Albumin (g/dL)  Date Value  06/14/2022 3.2 (L)  06/20/2012 3.8    Assessment/Plan: K 3.9>4.1>4.1>4.5>4.6    Mag <0.5 >1 >1.3>1.1>2.2 Phos 5.6>5.8>5.4>5.8>6.1 Ca 5.5>5.9>6.2>6.6>6.9      albumin 3.9>3.5>3.1>3.2 *Alb Corrected Ca: 7.5 Scr 3.45>4.01>4.49>4.80>4.54  Medications: calcium carbonate 1278m PO TID, spironolactone 261mdaily, started on calcitriol 0.44m73mQD 9/11 -Holding losartan and torsemide  MIVF: on LR @75ml /h  conservative dosing d/t renal fxn Spironolactone d/c F/u nephrology recommendations  -F/u electrolytes, ionized Calcium with am labs -Nephrology following  MerNoralee SpaceharmD 06/14/2022 8:11 AM

## 2022-06-14 NOTE — Progress Notes (Addendum)
[facility]  ROUNDING NOTE   Subjective:   [name] is a 81 year old female with medical concerns including GERD, diabetes, hypertension, dyslipidemia, hypothyroidism, and chronic kidney disease stage IV.  Patient presents to the emergency department complaining of shortness of breath and has been admitted for Hypocalcemia [E83.51] Generalized weakness [R53.1] Nausea vomiting and diarrhea [R11.2, R19.7] Pneumonia due to COVID-19 virus [U07.1, J12.82] COVID-19 [U07.1]  Patient is known to our practice and is followed outpatient by Dr. Juleen China.    Patient continues to have right side pain, states pain patch makes her sick and vomit Alert and oriented, husband at bedside Remains on room air No lower extremity edema   Objective:  Vital signs in last 24 hours:  Temp:  [98 F (36.7 C)-98.1 F (36.7 C)] 98 F (36.7 C) (09/13 0840) Pulse Rate:  [57-64] 60 (09/13 0840) Resp:  [20] 20 (09/13 0840) BP: (110-127)/(45-67) 111/57 (09/13 0840) SpO2:  [96 %-98 %] 97 % (09/13 0840)  Weight change:  Filed Weights   06/10/22 0141  Weight: 88.5 kg    Intake/Output: I/O last 3 completed shifts: In: 2331.4 [I.V.:1481.4; IV Piggyback:850] Out: 400 [Urine:400]   Intake/Output this shift:  Total I/O In: 240 [P.O.:240] Out: 200 [Urine:200]  Physical Exam: General: NAD, resting comfortably  Head: Normocephalic, atraumatic. Moist oral mucosal membranes  Eyes: Anicteric  Lungs:  Clear to auscultation, normal effort, room air  Heart: Regular rate and rhythm  Abdomen:  Soft, nontender, obese  Extremities: No peripheral edema.  Neurologic: Nonfocal, moving all four extremities  Skin: No lesions, dry lower extremities  Access: Lt AVF (maturing)    Basic Metabolic Panel: Recent Labs  Lab 06/12/22 0912 06/12/22 0913 06/12/22 1707 06/13/22 0619 06/13/22 1812 06/14/22 0506  NA  --  143 142 140 137 138  K  --  3.9 4.1 4.1 4.5 4.6  CL  --  114* 116* 113* 109 112*   CO2  --  16* 14* 14* 16* 15*  GLUCOSE  --  149* 93 100* 104* 100*  BUN  --  54* 55* 61* 63* 66*  CREATININE  --  3.45* 4.01* 4.49* 4.80* 4.54*  CALCIUM  --  5.5* 5.9* 6.2* 6.6* 6.9*  MG <0.5*  --  1.0* 1.3* 1.1* 2.2  PHOS 5.6*  --  5.8* 5.4* 5.8* 6.1*     Liver Function Tests: Recent Labs  Lab 06/10/22 0204 06/11/22 0651 06/12/22 0913 06/13/22 0619 06/13/22 1812 06/14/22 0506  AST 27 27 30 24   --  22  ALT 21 19 18 15   --  13  ALKPHOS 51 46 47 46  --  50  BILITOT 0.7 0.7 0.6 0.4  --  0.7  PROT 7.9 7.4 7.3 6.8  --  6.6  ALBUMIN 4.1 3.7 3.9 3.5 3.1* 3.2*    No results for input(s): "LIPASE", "AMYLASE" in the last 168 hours. No results for input(s): "AMMONIA" in the last 168 hours.  CBC: Recent Labs  Lab 06/10/22 0204 06/11/22 0651 06/12/22 0913 06/13/22 0619 06/14/22 0506  WBC 8.5 7.5 9.9 8.4 8.4  NEUTROABS 4.4 5.0 6.4 5.2 5.0  HGB 10.9* 10.4* 10.4* 9.5* 9.5*  HCT 33.7* 31.8* 31.9* 29.1* 29.8*  MCV 86.4 87.8 87.2 88.4 88.7  PLT 196 165 181 142* 160     Cardiac Enzymes: No results for input(s): "CKTOTAL", "CKMB", "CKMBINDEX", "TROPONINI" in the last 168 hours.  BNP: Invalid input(s): "POCBNP"  CBG: Recent Labs  Lab 06/10/22 1119 06/10/22 1725 06/10/22 2201  06/14/22 1138  GLUCAP 128* 101* 114* 109*     Microbiology: Results for orders placed or performed during the hospital encounter of 06/10/22  Culture, blood (routine x 2)     Status: None (Preliminary result)   Collection Time: 06/10/22  2:04 AM   Specimen: BLOOD RIGHT ARM  Result Value Ref Range Status   Specimen Description BLOOD RIGHT ARM  Final   Special Requests   Final    BOTTLES DRAWN AEROBIC AND ANAEROBIC Blood Culture adequate volume   Culture   Final    NO GROWTH 4 DAYS Performed at [facility], [address], [place], [place] [address]    Report Status PENDING  Incomplete  Culture, blood (routine x 2)     Status: None (Preliminary result)   Collection Time: 06/10/22   2:04 AM   Specimen: BLOOD RIGHT ARM  Result Value Ref Range Status   Specimen Description BLOOD RIGHT ARM  Final   Special Requests   Final    BOTTLES DRAWN AEROBIC AND ANAEROBIC Blood Culture results may not be optimal due to an inadequate volume of blood received in culture bottles   Culture   Final    NO GROWTH 4 DAYS Performed at [facility], [address] [place], [place] [address]    Report Status PENDING  Incomplete  Urine Culture     Status: None   Collection Time: 06/13/22  5:47 AM   Specimen: Urine, Clean Catch  Result Value Ref Range Status   Specimen Description   Final    URINE, CLEAN CATCH Performed at [facility], [address], [place], [place] [address]    Special Requests   Final    NONE Performed at [facility], [address], [place], [place] [address]    Culture   Final    NO GROWTH Performed at [facility], [place] [address], [place], [place] [address]    Report Status 06/14/2022 FINAL  Final    Coagulation Studies: No results for input(s): "LABPROT", "INR" in the last 72 hours.  Urinalysis: Recent Labs    06/13/22 0547  COLORURINE AMBER*  LABSPEC 1.015  PHURINE 5.0  GLUCOSEU NEGATIVE  HGBUR NEGATIVE  BILIRUBINUR NEGATIVE  KETONESUR NEGATIVE  PROTEINUR NEGATIVE  NITRITE NEGATIVE  LEUKOCYTESUR NEGATIVE       Imaging: No results found.   Medications:    ampicillin-sulbactam (UNASYN) IV 3 g (06/14/22 1029)   doxycycline (VIBRAMYCIN) IV 100 mg (06/14/22 0805)   lactated ringers 75 mL/hr at 06/14/22 1210   promethazine (PHENERGAN) injection (IM or IVPB) Stopped (06/11/22 1113)    amLODipine  5 mg Oral Daily   vitamin C  500 mg Oral Daily   azelastine  1 spray Each Nare BID   calcitRIOL  0.5 mcg Oral Daily   calcium carbonate  1 tablet Oral TID WC   diclofenac Sodium  2 g Topical QID   heparin  5,000 Units Subcutaneous Q8H   hydrALAZINE  100 mg Oral BID   insulin aspart  0-9 Units  Subcutaneous TID WC   Ipratropium-Albuterol  1 puff Inhalation Q6H   levothyroxine  150 mcg Oral Q0600   lidocaine  1 patch Transdermal Q12H   loratadine  10 mg Oral Daily   metoprolol tartrate  50 mg Oral BID   molnupiravir EUA  4 capsule Oral BID   montelukast  10 mg Oral QHS   multivitamin with minerals  1 tablet Oral Daily  pantoprazole  40 mg Oral QAC breakfast   scopolamine  1 patch Transdermal Q72H   sertraline  100 mg Oral Daily   zinc sulfate  220 mg Oral Daily   acetaminophen, albuterol, chlorpheniramine-HYDROcodone, guaiFENesin-dextromethorphan, hydrALAZINE, HYDROcodone-acetaminophen, loperamide, metoprolol tartrate, polyethylene glycol, promethazine (PHENERGAN) injection (IM or IVPB), senna-docusate, traZODone  Assessment/ Plan:  [name] is a 81 y.o.  female with medical concerns including GERD, diabetes, hypertension, dyslipidemia, hypothyroidism, and chronic kidney disease stage IV.  Patient presents to the emergency department complaining of shortness of breath and has been admitted for Hypocalcemia [E83.51] Generalized weakness [R53.1] Nausea vomiting and diarrhea [R11.2, R19.7] Pneumonia due to COVID-19 virus [U07.1, J12.82] COVID-19 [U07.1]   Acute Kidney Injury on chronic kidney disease stage 4 with baseline creatinine 2.7 and GFR of 17 on 05/09/2022.  Acute kidney injury secondary to hypovolemia and severe ATN secondary to concurrent infection. Chronic kidney disease is secondary to advanced age, hypertension and diabetes CT abdomen pelvis shows multiple renal cysts in right kidney, left kidney within normal limits with no obstruction.  Creatinine has improved today.  Continue IV fluids for now due to poor appetite.  Continue to avoid nephrotoxic agents and therapies.  We will continue to monitor.  No acute need for dialysis at this time.  Lab Results  Component Value Date   CREATININE 4.54 (H) 06/14/2022   CREATININE 4.80 (H) 06/13/2022   CREATININE  4.49 (H) 06/13/2022    Intake/Output Summary (Last 24 hours) at 06/14/2022 1347 Last data filed at 06/14/2022 1150 Gross per 24 hour  Intake 1570 ml  Output 500 ml  Net 1070 ml     2. Hypocalcemia, severe.  Calcium 5.7 on admission. Continue IV and oral supplementation by primary team.  Currently receiving calcium carbonate 3 times daily along with daily calcitriol.  3. Anemia of chronic kidney disease Lab Results  Component Value Date   HGB 9.5 (L) 06/14/2022    Hemoglobin remains within desired target.  4. Hypertension with chronic kidney disease. Home regimen includes amlodipine, hydralazine, losartan, metoprolol, spironolactone, and torsemide.  Diuretics and losartan remain held in setting of kidney injury.  5. Diabetes mellitus type II with chronic kidney disease/renal manifestations: noninsulin dependent.Most recent hemoglobin A1c is 6.4 on 08/24/29.   6.  COVID-19 pneumonia.  Patient receiving ampicillin-sulbactam and doxycycline, per primary team.  Continue supportive care.   LOS: [place] 9/13/20231:47 PM

## 2022-06-14 NOTE — Plan of Care (Signed)

## 2022-06-15 DIAGNOSIS — J1282 Pneumonia due to coronavirus disease 2019: Secondary | ICD-10-CM | POA: Diagnosis not present

## 2022-06-15 DIAGNOSIS — U071 COVID-19: Secondary | ICD-10-CM | POA: Diagnosis not present

## 2022-06-15 LAB — BASIC METABOLIC PANEL
Anion gap: 12 (ref 5–15)
BUN: 73 mg/dL — ABNORMAL HIGH (ref 8–23)
CO2: 15 mmol/L — ABNORMAL LOW (ref 22–32)
Calcium: 7.5 mg/dL — ABNORMAL LOW (ref 8.9–10.3)
Chloride: 107 mmol/L (ref 98–111)
Creatinine, Ser: 4.81 mg/dL — ABNORMAL HIGH (ref 0.44–1.00)
GFR, Estimated: 9 mL/min — ABNORMAL LOW (ref >60)
Glucose, Bld: 99 mg/dL (ref 70–99)
Potassium: 4.8 mmol/L (ref 3.5–5.1)
Sodium: 134 mmol/L — ABNORMAL LOW (ref 135–145)

## 2022-06-15 LAB — FERRITIN: Ferritin: 97 ng/mL (ref 11–307)

## 2022-06-15 LAB — CBC
HCT: 28.3 % — ABNORMAL LOW (ref 36.0–46.0)
Hemoglobin: 9.1 g/dL — ABNORMAL LOW (ref 12.0–15.0)
MCH: 28.2 pg (ref 26.0–34.0)
MCHC: 32.2 g/dL (ref 30.0–36.0)
MCV: 87.6 fL (ref 80.0–100.0)
Platelets: 160 10*3/uL (ref 150–400)
RBC: 3.23 MIL/uL — ABNORMAL LOW (ref 3.87–5.11)
RDW: 14.6 % (ref 11.5–15.5)
WBC: 9.5 10*3/uL (ref 4.0–10.5)
nRBC: 0 % (ref 0.0–0.2)

## 2022-06-15 LAB — GLUCOSE, CAPILLARY
Glucose-Capillary: 98 mg/dL (ref 70–99)
Glucose-Capillary: 103 mg/dL — ABNORMAL HIGH (ref 70–99)
Glucose-Capillary: 96 mg/dL (ref 70–99)
Glucose-Capillary: 97 mg/dL (ref 70–99)

## 2022-06-15 LAB — MAGNESIUM: Magnesium: 1.9 mg/dL (ref 1.7–2.4)

## 2022-06-15 LAB — CULTURE, BLOOD (ROUTINE X 2)
Culture: NO GROWTH
Culture: NO GROWTH
Special Requests: ADEQUATE

## 2022-06-15 LAB — PHOSPHORUS: Phosphorus: 6.8 mg/dL — ABNORMAL HIGH (ref 2.5–4.6)

## 2022-06-15 NOTE — Progress Notes (Signed)
[facility]  ROUNDING NOTE   Subjective:   [name] is a 81 year old female with medical concerns including GERD, diabetes, hypertension, dyslipidemia, hypothyroidism, and chronic kidney disease stage IV.  Patient presents to the emergency department complaining of shortness of breath and has been admitted for Hypocalcemia [E83.51] Generalized weakness [R53.1] Nausea vomiting and diarrhea [R11.2, R19.7] Pneumonia due to COVID-19 virus [U07.1, J12.82] COVID-19 [U07.1]  Patient is known to our practice and is followed outpatient by Dr. Juleen China.    Alert and oriented, husband at bedside Remains on room air No lower extremity edema Starting to feel some chest tightness and shortness of breath today.  Points to right lower lung field.   Objective:  Vital signs in last 24 hours:  Temp:  [97.4 F (36.3 C)-97.6 F (36.4 C)] 97.4 F (36.3 C) (09/14 0831) Pulse Rate:  [52-59] 52 (09/14 0831) Resp:  [14-20] 14 (09/14 0831) BP: (114-121)/(48-59) 114/59 (09/14 0831) SpO2:  [92 %-97 %] 95 % (09/14 1304)  Weight change:  Filed Weights   06/10/22 0141  Weight: 88.5 kg    Intake/Output: I/O last 3 completed shifts: In: 1761 [P.O.:240; I.V.:480; IV Piggyback:450] Out: 850 [Urine:850]   Intake/Output this shift:  Total I/O In: -  Out: 200 [Urine:200]  Physical Exam: General: NAD, resting comfortably  Head: Normocephalic, atraumatic. Moist oral mucosal membranes  Eyes: Anicteric  Lungs:  Mild diffuse wheezing, right lower crackles.  Heart: Regular rate and rhythm  Abdomen:  Soft, nontender, obese  Extremities: No peripheral edema.  Neurologic: Nonfocal, moving all four extremities  Skin: No lesions, dry lower extremities  Access: Lt AVF (maturing)    Basic Metabolic Panel: Recent Labs  Lab 06/12/22 1707 06/13/22 0619 06/13/22 1812 06/14/22 0506 06/15/22 0658  NA 142 140 137 138 134*  K 4.1 4.1 4.5 4.6 4.8  CL 116* 113* 109 112* 107  CO2 14* 14* 16*  15* 15*  GLUCOSE 93 100* 104* 100* 99  BUN 55* 61* 63* 66* 73*  CREATININE 4.01* 4.49* 4.80* 4.54* 4.81*  CALCIUM 5.9* 6.2* 6.6* 6.9* 7.5*  MG 1.0* 1.3* 1.1* 2.2 1.9  PHOS 5.8* 5.4* 5.8* 6.1* 6.8*     Liver Function Tests: Recent Labs  Lab 06/10/22 0204 06/11/22 0651 06/12/22 0913 06/13/22 0619 06/13/22 1812 06/14/22 0506  AST 27 27 30 24   --  22  ALT 21 19 18 15   --  13  ALKPHOS 51 46 47 46  --  50  BILITOT 0.7 0.7 0.6 0.4  --  0.7  PROT 7.9 7.4 7.3 6.8  --  6.6  ALBUMIN 4.1 3.7 3.9 3.5 3.1* 3.2*    No results for input(s): "LIPASE", "AMYLASE" in the last 168 hours. No results for input(s): "AMMONIA" in the last 168 hours.  CBC: Recent Labs  Lab 06/10/22 0204 06/11/22 0651 06/12/22 0913 06/13/22 0619 06/14/22 0506 06/15/22 0658  WBC 8.5 7.5 9.9 8.4 8.4 9.5  NEUTROABS 4.4 5.0 6.4 5.2 5.0  --   HGB 10.9* 10.4* 10.4* 9.5* 9.5* 9.1*  HCT 33.7* 31.8* 31.9* 29.1* 29.8* 28.3*  MCV 86.4 87.8 87.2 88.4 88.7 87.6  PLT 196 165 181 142* 160 160     Cardiac Enzymes: No results for input(s): "CKTOTAL", "CKMB", "CKMBINDEX", "TROPONINI" in the last 168 hours.  BNP: Invalid input(s): "POCBNP"  CBG: Recent Labs  Lab 06/14/22 1138 06/14/22 1637 06/14/22 2205 06/15/22 0744 06/15/22 1202  GLUCAP 109* 118* 118* 98 103*     Microbiology: Results for orders  placed or performed during the hospital encounter of 06/10/22  Culture, blood (routine x 2)     Status: None   Collection Time: 06/10/22  2:04 AM   Specimen: BLOOD RIGHT ARM  Result Value Ref Range Status   Specimen Description BLOOD RIGHT ARM  Final   Special Requests   Final    BOTTLES DRAWN AEROBIC AND ANAEROBIC Blood Culture adequate volume   Culture   Final    NO GROWTH 5 DAYS Performed at [facility], [address] [place], [place] [address]    Report Status 06/15/2022 FINAL  Final  Culture, blood (routine x 2)     Status: None   Collection Time: 06/10/22  2:04 AM   Specimen: BLOOD  RIGHT ARM  Result Value Ref Range Status   Specimen Description BLOOD RIGHT ARM  Final   Special Requests   Final    BOTTLES DRAWN AEROBIC AND ANAEROBIC Blood Culture results may not be optimal due to an inadequate volume of blood received in culture bottles   Culture   Final    NO GROWTH 5 DAYS Performed at [facility], [address], [place], [place] [address]    Report Status 06/15/2022 FINAL  Final  Urine Culture     Status: None   Collection Time: 06/13/22  5:47 AM   Specimen: Urine, Clean Catch  Result Value Ref Range Status   Specimen Description   Final    URINE, CLEAN CATCH Performed at [facility], [address], [place], [place] [address]    Special Requests   Final    NONE Performed at [facility], [address], [place], [place] [address]    Culture   Final    NO GROWTH Performed at [facility], [place] [address], [place], [place] [address]    Report Status 06/14/2022 FINAL  Final    Coagulation Studies: No results for input(s): "LABPROT", "INR" in the last 72 hours.  Urinalysis: Recent Labs    06/13/22 0547  COLORURINE AMBER*  LABSPEC 1.015  PHURINE 5.0  GLUCOSEU NEGATIVE  HGBUR NEGATIVE  BILIRUBINUR NEGATIVE  KETONESUR NEGATIVE  PROTEINUR NEGATIVE  NITRITE NEGATIVE  LEUKOCYTESUR NEGATIVE       Imaging: No results found.   Medications:    ampicillin-sulbactam (UNASYN) IV 3 g (06/15/22 1012)   doxycycline (VIBRAMYCIN) IV 100 mg (06/15/22 0759)   lactated ringers Stopped (06/15/22 1207)   promethazine (PHENERGAN) injection (IM or IVPB) Stopped (06/11/22 1113)    amLODipine  5 mg Oral Daily   vitamin C  500 mg Oral Daily   azelastine  1 spray Each Nare BID   calcitRIOL  0.5 mcg Oral Daily   calcium carbonate  1 tablet Oral TID WC   diclofenac Sodium  2 g Topical QID   heparin  5,000 Units Subcutaneous Q8H   hydrALAZINE  100 mg Oral BID   insulin aspart  0-9 Units Subcutaneous TID WC    Ipratropium-Albuterol  1 puff Inhalation Q6H   levothyroxine  150 mcg Oral Q0600   lidocaine  1 patch Transdermal Q12H   loratadine  10 mg Oral Daily   metoprolol tartrate  50 mg Oral BID   molnupiravir EUA  4 capsule Oral BID   montelukast  10 mg Oral QHS   multivitamin with minerals  1 tablet Oral Daily   pantoprazole  40 mg Oral QAC breakfast   scopolamine  1 patch Transdermal Q72H   sertraline  100  mg Oral Daily   zinc sulfate  220 mg Oral Daily   acetaminophen, albuterol, chlorpheniramine-HYDROcodone, guaiFENesin-dextromethorphan, hydrALAZINE, HYDROcodone-acetaminophen, loperamide, metoprolol tartrate, polyethylene glycol, promethazine (PHENERGAN) injection (IM or IVPB), senna-docusate, traZODone  Assessment/ Plan:  [name] is a 81 y.o.  female with medical concerns including GERD, diabetes, hypertension, dyslipidemia, hypothyroidism, and chronic kidney disease stage IV.  Patient presents to the emergency department complaining of shortness of breath and has been admitted for Hypocalcemia [E83.51] Generalized weakness [R53.1] Nausea vomiting and diarrhea [R11.2, R19.7] Pneumonia due to COVID-19 virus [U07.1, J12.82] COVID-19 [U07.1]   Acute Kidney Injury on chronic kidney disease stage 4 with baseline creatinine 2.7 and GFR of 17 on 05/09/2022.  Acute kidney injury secondary to hypovolemia and severe ATN secondary to concurrent infection. Chronic kidney disease is secondary to advanced age, hypertension and diabetes CT abdomen pelvis shows multiple renal cysts in right kidney, left kidney within normal limits with no obstruction. Serum creatinine remains critically elevated.  Urine output stably low.  Discussed with patient that if the trend continues, she may need hemodialysis.  She has tentatively agreed to it if necessary. Patient is starting to get pulmonary congestion.  We will discontinue maintenance IV fluids.    Lab Results  Component Value Date   CREATININE 4.81  (H) 06/15/2022   CREATININE 4.54 (H) 06/14/2022   CREATININE 4.80 (H) 06/13/2022    Intake/Output Summary (Last 24 hours) at 06/15/2022 1552 Last data filed at 06/15/2022 1518 Gross per 24 hour  Intake --  Output 400 ml  Net -400 ml     2. Hypocalcemia, severe.  Calcium 5.7 on admission. Continue IV and oral supplementation by primary team.  Currently receiving calcium carbonate 3 times daily along with daily calcitriol.  3. Anemia of chronic kidney disease Lab Results  Component Value Date   HGB 9.1 (L) 06/15/2022    Hemoglobin remains within desired target.  4. Hypertension with chronic kidney disease. Home regimen includes amlodipine, hydralazine, losartan, metoprolol, spironolactone, and torsemide.  Diuretics and losartan remain held in setting of kidney injury.  5. Diabetes mellitus type II with chronic kidney disease/renal manifestations: noninsulin dependent.Most recent hemoglobin A1c is 6.0% on June 14, 2022.  6.  COVID-19 pneumonia.  Patient receiving ampicillin-sulbactam and doxycycline, per primary team.  Continue supportive care.   LOS: 5 Cynthia Cogle 9/14/20233:52 PM

## 2022-06-15 NOTE — Progress Notes (Signed)
PROGRESS NOTE    [name]  KZS:[id number] DOB: [birth year] DOA: 06/10/2022 PCP: Kirk Ruths, MD   Brief Narrative:  81 y.o. Caucasian female with medical history significant for asthma, CHF, type diabetes mellitus, GERD, hypertension, dyslipidemia, CKD stage IV, hypothyroidism and OSA, who presented to the ER with acute onset of generalized weakness with associated nausea, vomiting and diarrhea. She has been having associated chest congestion and was recently diagnosed with COVID-19 on Monday.  She was started on p.o. molnupiravir.  Upon admission patient was noted to have worsening renal function concerns for ATN, nephrology team was consulted.  She had multiple electrolyte derangements requiring repletion.   Assessment & Plan:  Principal Problem:   Pneumonia due to COVID-19 virus Active Problems:   Hypocalcemia   Right shoulder pain   Essential hypertension   CKD (chronic kidney disease), stage IV (HCC)   Dyslipidemia   Depression   AKI (acute kidney injury) ([place])   Hypomagnesemia   Hyperphosphatemia     Assessment and Plan: * Pneumonia due to COVID-19 virus Concerns of superimposed aspiration pneumonia due to nausea and vomiting.  Currently on Molnupiravir continue and finish the course.  Complete 5-day course of Unasyn and doxycycline.  Today's the last day bronchodilators/I-S/flutter valve.  Supportive care.   AKI on CKD (chronic kidney disease), stage IV (HCC) - Cr Continues to climb up; today 4.8. Suspect ATN. Nephro is following. Getting IVF. Uncertain yet, if she will need to be started on HD.  Bedside bladder scan does not show any acute urinary retention.  For Stop Aldactone.  Start Na Bicarb? Will defer to nephro.   DM2 ISS and accuchecks.   Hypocalcemia -Prn repletion. Not on home meds.   Right shoulder pain - X-ray showing calcified tendinitis.  Pain control  Depression - On Zoloft  Dyslipidemia - Statin on hold while on  Paxlovid   Essential hypertension - We will continue her antihypertensives.    DVT prophylaxis: SQ Heparin Code Status: Full Family Communication: Significant other at bedside  Status is: Inpatient Creatinine still elevated, nephro following.  Ongoing management  Subjective: No complaints, overall feeling well.  When I walked in the room she was very sleepy as she did not get much rest last night.    Examination: Constitutional: Not in acute distress Respiratory: Mild bibasilar crackles Cardiovascular: Normal sinus rhythm, no rubs Abdomen: Nontender nondistended good bowel sounds Musculoskeletal: No edema noted Skin: No rashes seen Neurologic: CN 2-12 grossly intact.  And nonfocal Psychiatric: Normal judgment and insight. Alert and oriented x 3. Normal mood.   Objective: Vitals:   06/14/22 0550 06/14/22 0840 06/14/22 2202 06/15/22 0831  BP: 127/67 (!) 111/57 (!) 121/48 (!) 114/59  Pulse: (!) 57 60 (!) 59 (!) 52  Resp: 20 20 20 14   Temp: 98.1 F (36.7 C) 98 F (36.7 C) 97.6 F (36.4 C) (!) 97.4 F (36.3 C)  TempSrc: Axillary Oral    SpO2: 98% 97% 92% 97%  Weight:      Height:        Intake/Output Summary (Last 24 hours) at 06/15/2022 0842 Last data filed at 06/14/2022 2202 Gross per 24 hour  Intake 240 ml  Output 550 ml  Net -310 ml   Filed Weights   06/10/22 0141  Weight: 88.5 kg     Data Reviewed:   CBC: Recent Labs  Lab 06/10/22 0204 06/11/22 0651 06/12/22 0913 06/13/22 0619 06/14/22 0506 06/15/22 0658  WBC 8.5 7.5 9.9 8.4 8.4 9.5  NEUTROABS 4.4 5.0 6.4 5.2 5.0  --   HGB 10.9* 10.4* 10.4* 9.5* 9.5* 9.1*  HCT 33.7* 31.8* 31.9* 29.1* 29.8* 28.3*  MCV 86.4 87.8 87.2 88.4 88.7 87.6  PLT 196 165 181 142* 160 268   Basic Metabolic Panel: Recent Labs  Lab 06/12/22 1707 06/13/22 0619 06/13/22 1812 06/14/22 0506 06/15/22 0658  NA 142 140 137 138 134*  K 4.1 4.1 4.5 4.6 4.8  CL 116* 113* 109 112* 107  CO2 14* 14* 16* 15* 15*  GLUCOSE 93  100* 104* 100* 99  BUN 55* 61* 63* 66* 73*  CREATININE 4.01* 4.49* 4.80* 4.54* 4.81*  CALCIUM 5.9* 6.2* 6.6* 6.9* 7.5*  MG 1.0* 1.3* 1.1* 2.2 1.9  PHOS 5.8* 5.4* 5.8* 6.1* 6.8*   GFR: Estimated Creatinine Clearance: 9.5 mL/min (A) (by C-G formula based on SCr of 4.81 mg/dL (H)). Liver Function Tests: Recent Labs  Lab 06/10/22 0204 06/11/22 0651 06/12/22 0913 06/13/22 0619 06/13/22 1812 06/14/22 0506  AST 27 27 30 24   --  22  ALT 21 19 18 15   --  13  ALKPHOS 51 46 47 46  --  50  BILITOT 0.7 0.7 0.6 0.4  --  0.7  PROT 7.9 7.4 7.3 6.8  --  6.6  ALBUMIN 4.1 3.7 3.9 3.5 3.1* 3.2*   No results for input(s): "LIPASE", "AMYLASE" in the last 168 hours. No results for input(s): "AMMONIA" in the last 168 hours. Coagulation Profile: No results for input(s): "INR", "PROTIME" in the last 168 hours. Cardiac Enzymes: No results for input(s): "CKTOTAL", "CKMB", "CKMBINDEX", "TROPONINI" in the last 168 hours. BNP (last 3 results) No results for input(s): "PROBNP" in the last 8760 hours. HbA1C: Recent Labs    06/14/22 0506  HGBA1C 6.0*   CBG: Recent Labs  Lab 06/10/22 2201 06/14/22 1138 06/14/22 1637 06/14/22 2205 06/15/22 0744  GLUCAP 114* 109* 118* 118* 98   Lipid Profile: No results for input(s): "CHOL", "HDL", "LDLCALC", "TRIG", "CHOLHDL", "LDLDIRECT" in the last 72 hours. Thyroid Function Tests: No results for input(s): "TSH", "T4TOTAL", "FREET4", "T3FREE", "THYROIDAB" in the last 72 hours. Anemia Panel: Recent Labs    06/14/22 0506 06/15/22 0658  FERRITIN 106 97   Sepsis Labs: Recent Labs  Lab 06/10/22 0204  PROCALCITON <0.10  LATICACIDVEN 1.0    Recent Results (from the past 240 hour(s))  Resp Panel by RT-PCR (Flu A&B, Covid) Anterior Nasal Swab     Status: Abnormal   Collection Time: 06/08/22  2:37 PM   Specimen: Anterior Nasal Swab  Result Value Ref Range Status   SARS Coronavirus 2 by RT PCR POSITIVE (A) NEGATIVE Final    Comment: (NOTE) SARS-CoV-2  target nucleic acids are DETECTED.  The SARS-CoV-2 RNA is generally detectable in upper respiratory specimens during the acute phase of infection. Positive results are indicative of the presence of the identified virus, but do not rule out bacterial infection or co-infection with other pathogens not detected by the test. Clinical correlation with patient history and other diagnostic information is necessary to determine patient infection status. The expected result is Negative.  Fact Sheet for Patients: EntrepreneurPulse.com.au  Fact Sheet for Healthcare Providers: IncredibleEmployment.be  This test is not yet approved or cleared by the [place] FDA and  has been authorized for detection and/or diagnosis of SARS-CoV-2 by FDA under an Emergency Use Authorization (EUA).  This EUA will remain in effect (meaning this test can be used) for the duration of  the COVID-19 declaration under Section  564(b)(1) of the A ct, 21 U.S.C. section 360bbb-3(b)(1), unless the authorization is terminated or revoked sooner.     Influenza A by PCR NEGATIVE NEGATIVE Final   Influenza B by PCR NEGATIVE NEGATIVE Final    Comment: (NOTE) The Xpert Xpress SARS-CoV-2/FLU/RSV plus assay is intended as an aid in the diagnosis of influenza from Nasopharyngeal swab specimens and should not be used as a sole basis for treatment. Nasal washings and aspirates are unacceptable for Xpert Xpress SARS-CoV-2/FLU/RSV testing.  Fact Sheet for Patients: EntrepreneurPulse.com.au  Fact Sheet for Healthcare Providers: IncredibleEmployment.be  This test is not yet approved or cleared by the [place] FDA and has been authorized for detection and/or diagnosis of SARS-CoV-2 by FDA under an Emergency Use Authorization (EUA). This EUA will remain in effect (meaning this test can be used) for the duration of the COVID-19 declaration under Section  564(b)(1) of the Act, 21 U.S.C. section 360bbb-3(b)(1), unless the authorization is terminated or revoked.  Performed at [facility], [address] [place], [place] [address]   Culture, blood (routine x 2)     Status: None   Collection Time: 06/10/22  2:04 AM   Specimen: BLOOD RIGHT ARM  Result Value Ref Range Status   Specimen Description BLOOD RIGHT ARM  Final   Special Requests   Final    BOTTLES DRAWN AEROBIC AND ANAEROBIC Blood Culture adequate volume   Culture   Final    NO GROWTH 5 DAYS Performed at [facility], [address] [place], [place] [address]    Report Status 06/15/2022 FINAL  Final  Culture, blood (routine x 2)     Status: None   Collection Time: 06/10/22  2:04 AM   Specimen: BLOOD RIGHT ARM  Result Value Ref Range Status   Specimen Description BLOOD RIGHT ARM  Final   Special Requests   Final    BOTTLES DRAWN AEROBIC AND ANAEROBIC Blood Culture results may not be optimal due to an inadequate volume of blood received in culture bottles   Culture   Final    NO GROWTH 5 DAYS Performed at [facility], [address], [place], [place] [address]    Report Status 06/15/2022 FINAL  Final  Urine Culture     Status: None   Collection Time: 06/13/22  5:47 AM   Specimen: Urine, Clean Catch  Result Value Ref Range Status   Specimen Description   Final    URINE, CLEAN CATCH Performed at [facility], [address] [place], [place] [address]    Special Requests   Final    NONE Performed at [facility], [address], [place], [place] [address]    Culture   Final    NO GROWTH Performed at [facility], [place] [address] [place], [place] [address]    Report Status 06/14/2022 FINAL  Final         Radiology Studies: No results found.      Scheduled Meds:  amLODipine  5 mg Oral Daily   vitamin C  500 mg Oral Daily   azelastine  1 spray Each Nare BID   calcitRIOL  0.5 mcg Oral Daily    calcium carbonate  1 tablet Oral TID WC   diclofenac Sodium  2 g Topical QID   heparin  5,000 Units Subcutaneous Q8H   hydrALAZINE  100 mg Oral BID   insulin aspart  0-9 Units Subcutaneous TID WC   Ipratropium-Albuterol  1 puff Inhalation  Q6H   levothyroxine  150 mcg Oral Q0600   lidocaine  1 patch Transdermal Q12H   loratadine  10 mg Oral Daily   metoprolol tartrate  50 mg Oral BID   molnupiravir EUA  4 capsule Oral BID   montelukast  10 mg Oral QHS   multivitamin with minerals  1 tablet Oral Daily   pantoprazole  40 mg Oral QAC breakfast   scopolamine  1 patch Transdermal Q72H   sertraline  100 mg Oral Daily   zinc sulfate  220 mg Oral Daily   Continuous Infusions:  ampicillin-sulbactam (UNASYN) IV 3 g (06/15/22 0327)   doxycycline (VIBRAMYCIN) IV 100 mg (06/15/22 0759)   lactated ringers 75 mL/hr at 06/14/22 1210   promethazine (PHENERGAN) injection (IM or IVPB) Stopped (06/11/22 1113)     LOS: 5 days   Time spent= 35 mins    Willette Mudry Arsenio Loader, MD [facility]  If 7PM-7AM, please contact night-coverage  06/15/2022, 8:42 AM

## 2022-06-15 NOTE — Progress Notes (Signed)
Physical Therapy Treatment Patient Details Name: [name] MRN: [id number] DOB: 12/17/1940 Today's Date: 06/15/2022   History of Present Illness Patient is a 81 y.o. Caucasian female with medical history significant for asthma, CHF, type diabetes mellitus, GERD, hypertension, dyslipidemia, CKD stage IV, hypothyroidism and OSA, who presented to the ER with acute onset of generalized weakness with associated nausea, vomiting and diarrhea, recent diagnosis of COVID-19.    PT Comments    Pt is agreeable to PT. She demonstrates need for verbal cues for safety with ambulation. Pt with balance deficits with increased gait speeds. She had x2 LOB that required Min A for recovery. The pt demonstrates greatest imbalances with changing directions. PT will continue to follow. Recommendation remain appropriate.     Recommendations for follow up therapy are one component of a multi-disciplinary discharge planning process, led by the attending physician.  Recommendations may be updated based on patient status, additional functional criteria and insurance authorization.  Follow Up Recommendations  Home health PT     Assistance Recommended at Discharge Set up Supervision/Assistance  Patient can return home with the following A little help with walking and/or transfers;A little help with bathing/dressing/bathroom;Assist for transportation;Help with stairs or ramp for entrance;Assistance with cooking/housework   Equipment Recommendations  None recommended by PT    Recommendations for Other Services       Precautions / Restrictions Precautions Precautions: Fall     Mobility  Bed Mobility Overal bed mobility: Needs Assistance Bed Mobility: Supine to Sit, Sit to Supine     Supine to sit: HOB elevated, Supervision (Use of bed railings) Sit to supine: Supervision        Transfers Overall transfer level: Needs assistance Equipment used: Rolling walker (2 wheels) Transfers: Sit to/from  Stand Sit to Stand: Min guard                Ambulation/Gait Ambulation/Gait assistance: Min Web designer (Feet): 30 Feet Assistive device: Rolling walker (2 wheels)         General Gait Details: Pt with balance deficits. She requires verbal cues for slowed gait pattern and improved proximity in [employer]             Wheelchair Mobility    Modified Rankin (Stroke Patients Only)       Balance Overall balance assessment: Needs assistance Sitting-balance support: Feet supported Sitting balance-Leahy Scale: Good     Standing balance support: During functional activity Standing balance-Leahy Scale: Fair                              Cognition Arousal/Alertness: Awake/alert Behavior During Therapy: WFL for tasks assessed/performed Overall Cognitive Status: Within Functional Limits for tasks assessed                                          Exercises      General Comments        Pertinent Vitals/Pain Pain Assessment Pain Assessment: No/denies pain    Home Living                          Prior Function            PT Goals (current goals can now be found in the care plan section) Acute Rehab PT Goals Patient Stated  Goal: to return home PT Goal Formulation: With patient Time For Goal Achievement: 06/27/22 Potential to Achieve Goals: Good Progress towards PT goals: Progressing toward goals    Frequency    Min 2X/week      PT Plan Current plan remains appropriate    Co-evaluation              AM-PAC PT "6 Clicks" Mobility   Outcome Measure  Help needed turning from your back to your side while in a flat bed without using bedrails?: A Little Help needed moving from lying on your back to sitting on the side of a flat bed without using bedrails?: A Little Help needed moving to and from a bed to a chair (including a wheelchair)?: A Little Help needed standing up from a chair using  your arms (e.g., wheelchair or bedside chair)?: A Little Help needed to walk in hospital room?: A Little Help needed climbing 3-5 steps with a railing? : A Little 6 Click Score: 18    End of Session Equipment Utilized During Treatment: Oxygen;Gait belt Activity Tolerance: Patient limited by fatigue Patient left: in bed;with call bell/phone within reach;with family/visitor present Nurse Communication: Mobility status PT Visit Diagnosis: Unsteadiness on feet (R26.81);Muscle weakness (generalized) (M62.81)     Time: 1013-1040 PT Time Calculation (min) (ACUTE ONLY): 27 min  Charges:  $Gait Training: 23-37 mins                     1:10 PM, 06/15/22 Martika Egler A. Saverio Danker PT, DPT Physical Therapist - [facility]    Jamilex Bohnsack A Keali Mccraw 06/15/2022, 1:09 PM

## 2022-06-15 NOTE — Care Management Important Message (Signed)
Important Message  Patient Details  Name: [name] MRN: [id number] Date of Birth: 12/08/1940   Medicare Important Message Given:  Yes     Loann Quill 06/15/2022, 11:46 AM

## 2022-06-15 NOTE — Progress Notes (Signed)
Bedside report, pt reported independent and refused bed alarm. Pt was educated on the use of call bell, grip socks and dangling before standing. Pt verbalized understanding. Bed is low; personal items at reach.

## 2022-06-15 NOTE — Plan of Care (Signed)
  Problem: Education: Goal: Knowledge of risk factors and measures for prevention of condition will improve Outcome: Progressing   Problem: Coping: Goal: Psychosocial and spiritual needs will be supported Outcome: Progressing   Problem: Respiratory: Goal: Will maintain a patent airway Outcome: Progressing Goal: Complications related to the disease process, condition or treatment will be avoided or minimized Outcome: Progressing   Problem: Education: Goal: Knowledge of General Education information will improve Description: Including pain rating scale, medication(s)/side effects and non-pharmacologic comfort measures Outcome: Progressing   Problem: Health Behavior/Discharge Planning: Goal: Ability to manage health-related needs will improve Outcome: Progressing   Problem: Clinical Measurements: Goal: Ability to maintain clinical measurements within normal limits will improve Outcome: Progressing Goal: Will remain free from infection Outcome: Progressing Goal: Diagnostic test results will improve Outcome: Progressing Goal: Respiratory complications will improve Outcome: Progressing Goal: Cardiovascular complication will be avoided Outcome: Progressing   Problem: Activity: Goal: Risk for activity intolerance will decrease Outcome: Progressing   Problem: Nutrition: Goal: Adequate nutrition will be maintained Outcome: Progressing   Problem: Coping: Goal: Level of anxiety will decrease Outcome: Progressing   Problem: Elimination: Goal: Will not experience complications related to bowel motility Outcome: Progressing Goal: Will not experience complications related to urinary retention Outcome: Progressing   Problem: Pain Managment: Goal: General experience of comfort will improve Outcome: Progressing   Problem: Safety: Goal: Ability to remain free from injury will improve Outcome: Progressing   Problem: Skin Integrity: Goal: Risk for impaired skin integrity will  decrease Outcome: Progressing   Problem: Education: Goal: Ability to describe self-care measures that may prevent or decrease complications (Diabetes Survival Skills Education) will improve Outcome: Progressing Goal: Individualized Educational Video(s) Outcome: Progressing   Problem: Coping: Goal: Ability to adjust to condition or change in health will improve Outcome: Progressing   Problem: Fluid Volume: Goal: Ability to maintain a balanced intake and output will improve Outcome: Progressing   Problem: Health Behavior/Discharge Planning: Goal: Ability to identify and utilize available resources and services will improve Outcome: Progressing Goal: Ability to manage health-related needs will improve Outcome: Progressing   Problem: Metabolic: Goal: Ability to maintain appropriate glucose levels will improve Outcome: Progressing   Problem: Nutritional: Goal: Maintenance of adequate nutrition will improve Outcome: Progressing Goal: Progress toward achieving an optimal weight will improve Outcome: Progressing   Problem: Skin Integrity: Goal: Risk for impaired skin integrity will decrease Outcome: Progressing   Problem: Tissue Perfusion: Goal: Adequacy of tissue perfusion will improve Outcome: Progressing   

## 2022-06-16 DIAGNOSIS — J1282 Pneumonia due to coronavirus disease 2019: Secondary | ICD-10-CM | POA: Diagnosis not present

## 2022-06-16 DIAGNOSIS — U071 COVID-19: Secondary | ICD-10-CM | POA: Diagnosis not present

## 2022-06-16 LAB — BASIC METABOLIC PANEL
Anion gap: 13 (ref 5–15)
BUN: 77 mg/dL — ABNORMAL HIGH (ref 8–23)
CO2: 13 mmol/L — ABNORMAL LOW (ref 22–32)
Calcium: 7.6 mg/dL — ABNORMAL LOW (ref 8.9–10.3)
Chloride: 107 mmol/L (ref 98–111)
Creatinine, Ser: 4.86 mg/dL — ABNORMAL HIGH (ref 0.44–1.00)
GFR, Estimated: 8 mL/min — ABNORMAL LOW (ref >60)
Glucose, Bld: 92 mg/dL (ref 70–99)
Potassium: 4.8 mmol/L (ref 3.5–5.1)
Sodium: 133 mmol/L — ABNORMAL LOW (ref 135–145)

## 2022-06-16 LAB — GLUCOSE, CAPILLARY
Glucose-Capillary: 91 mg/dL (ref 70–99)
Glucose-Capillary: 93 mg/dL (ref 70–99)
Glucose-Capillary: 108 mg/dL — ABNORMAL HIGH (ref 70–99)
Glucose-Capillary: 102 mg/dL — ABNORMAL HIGH (ref 70–99)

## 2022-06-16 LAB — CBC
HCT: 27.5 % — ABNORMAL LOW (ref 36.0–46.0)
Hemoglobin: 8.9 g/dL — ABNORMAL LOW (ref 12.0–15.0)
MCH: 28 pg (ref 26.0–34.0)
MCHC: 32.4 g/dL (ref 30.0–36.0)
MCV: 86.5 fL (ref 80.0–100.0)
Platelets: 161 10*3/uL (ref 150–400)
RBC: 3.18 MIL/uL — ABNORMAL LOW (ref 3.87–5.11)
RDW: 14.5 % (ref 11.5–15.5)
WBC: 9.4 10*3/uL (ref 4.0–10.5)
nRBC: 0 % (ref 0.0–0.2)

## 2022-06-16 LAB — MAGNESIUM: Magnesium: 1.6 mg/dL — ABNORMAL LOW (ref 1.7–2.4)

## 2022-06-16 LAB — BRAIN NATRIURETIC PEPTIDE: B Natriuretic Peptide: 805.3 pg/mL — ABNORMAL HIGH (ref 0.0–100.0)

## 2022-06-16 LAB — CALCIUM, IONIZED: Calcium, Ionized, Serum: 4 mg/dL — ABNORMAL LOW (ref 4.5–5.6)

## 2022-06-16 MED ORDER — FUROSEMIDE 10 MG/ML IJ SOLN
40.0000 mg | Freq: Once | INTRAMUSCULAR | Status: AC
  Administered 2022-06-16: 40 mg via INTRAVENOUS
  Filled 2022-06-16: qty 4

## 2022-06-16 MED ORDER — MAGNESIUM SULFATE 2 GM/50ML IV SOLN
2.0000 g | Freq: Once | INTRAVENOUS | Status: AC
  Administered 2022-06-16: 2 g via INTRAVENOUS
  Filled 2022-06-16: qty 50

## 2022-06-16 NOTE — Progress Notes (Signed)
Physical Therapy Treatment Patient Details Name: [name] MRN: [id number] DOB: 1941-07-14 Today's Date: 06/16/2022   History of Present Illness Patient is a 81 y.o. Caucasian female with medical history significant for asthma, CHF, type diabetes mellitus, GERD, hypertension, dyslipidemia, CKD stage IV, hypothyroidism and OSA, who presented to the ER with acute onset of generalized weakness with associated nausea, vomiting and diarrhea, recent diagnosis of COVID-19.    PT Comments    Patient reports not feeling well today. She is letharigc at times and needs stimulation to remain alert for participation but is able to follow all commands when awake. She is fatigued with minimal activity with increased respiration rate just with rolling in bed. She declined sitting up or OOB mobility at this time. Lower extremity exercises performed in bed for strengthening. Sp02 94% on 2 L 02 with heart rate in the 80's. Recommend to continue PT to maximize independence and decrease caregiver burden.    Recommendations for follow up therapy are one component of a multi-disciplinary discharge planning process, led by the attending physician.  Recommendations may be updated based on patient status, additional functional criteria and insurance authorization.  Follow Up Recommendations  Home health PT     Assistance Recommended at Discharge Set up Supervision/Assistance  Patient can return home with the following A little help with walking and/or transfers;A little help with bathing/dressing/bathroom;Assist for transportation;Help with stairs or ramp for entrance;Assistance with cooking/housework   Equipment Recommendations  None recommended by PT    Recommendations for Other Services       Precautions / Restrictions Precautions Precautions: Fall Restrictions Weight Bearing Restrictions: No     Mobility  Bed Mobility Overal bed mobility: Needs Assistance Bed Mobility: Rolling Rolling:  Supervision         General bed mobility comments: patient rolled several times to remove wet pad and replace with a dry one with occasional cues for safety. increased time and effort with no physical assistance needed. patient seems very fatigued with minimal activity today with increased respiration rate temporarily with just rolling. Sp02 94%. she declined sitting up on the edge of bed due to not feeling well    Transfers                        Ambulation/Gait                   Stairs             Wheelchair Mobility    Modified Rankin (Stroke Patients Only)       Balance                                            Cognition Arousal/Alertness: Lethargic Behavior During Therapy: WFL for tasks assessed/performed Overall Cognitive Status: Within Functional Limits for tasks assessed                                 General Comments: patient is lethargic and tends to fall asleep quickly if not participating with mobility. she reports not feeling well today. she is able to follow all commands without difficulty when awake        Exercises General Exercises - Lower Extremity Heel Slides: AAROM, Strengthening, Both, 10 reps, Supine Hip ABduction/ADduction: AAROM, Strengthening, Both, 10  reps, Supine Straight Leg Raises: AAROM, Strengthening, Both, 10 reps, Supine Other Exercises Other Exercises: verbal and visual cues for exercise technique    General Comments        Pertinent Vitals/Pain Pain Assessment Pain Assessment: No/denies pain    Home Living                          Prior Function            PT Goals (current goals can now be found in the care plan section) Acute Rehab PT Goals Patient Stated Goal: to return home PT Goal Formulation: With patient Time For Goal Achievement: 06/27/22 Potential to Achieve Goals: Good Progress towards PT goals: Progressing toward goals     Frequency    Min 2X/week      PT Plan Current plan remains appropriate    Co-evaluation              AM-PAC PT "6 Clicks" Mobility   Outcome Measure  Help needed turning from your back to your side while in a flat bed without using bedrails?: A Little Help needed moving from lying on your back to sitting on the side of a flat bed without using bedrails?: A Little Help needed moving to and from a bed to a chair (including a wheelchair)?: A Little Help needed standing up from a chair using your arms (e.g., wheelchair or bedside chair)?: A Little Help needed to walk in hospital room?: A Little Help needed climbing 3-5 steps with a railing? : A Little 6 Click Score: 18    End of Session Equipment Utilized During Treatment: Oxygen Activity Tolerance: Patient limited by fatigue Patient left: in bed;with call bell/phone within reach;with bed alarm set Nurse Communication: Mobility status PT Visit Diagnosis: Unsteadiness on feet (R26.81);Muscle weakness (generalized) (M62.81)     Time: 3545-6256 PT Time Calculation (min) (ACUTE ONLY): 26 min  Charges:  $Therapeutic Exercise: 8-22 mins $Therapeutic Activity: 8-22 mins                     Minna Merritts, PT, MPT   Percell Locus 06/16/2022, 11:03 AM

## 2022-06-16 NOTE — Progress Notes (Signed)
[facility]  ROUNDING NOTE   Subjective:   [name] is a 81 year old female with medical concerns including GERD, diabetes, hypertension, dyslipidemia, hypothyroidism, and chronic kidney disease stage IV.  Patient presents to the emergency department complaining of shortness of breath and has been admitted for Hypocalcemia [E83.51] Generalized weakness [R53.1] Nausea vomiting and diarrhea [R11.2, R19.7] Pneumonia due to COVID-19 virus [U07.1, J12.82] COVID-19 [U07.1]  Patient is known to our practice and is followed outpatient by Dr. Juleen China.    Alert and oriented, husband at bedside Feeling poorly today.  Having difficulty with breathing.  Requesting nebulizer treatment. Serum creatinine remains elevated at 4.8.   Objective:  Vital signs in last 24 hours:  Temp:  [97.4 F (36.3 C)-98.2 F (36.8 C)] 97.4 F (36.3 C) (09/15 0859) Pulse Rate:  [58-64] 64 (09/15 0859) Resp:  [20-22] 20 (09/15 0859) BP: (135-162)/(48-57) 162/57 (09/15 0859) SpO2:  [93 %-97 %] 93 % (09/15 0859)  Weight change:  Filed Weights   06/10/22 0141  Weight: 88.5 kg    Intake/Output: I/O last 3 completed shifts: In: -  Out: 500 [Urine:500]   Intake/Output this shift:  No intake/output data recorded.  Physical Exam: General: NAD, resting comfortably  Head: Normocephalic, atraumatic. Moist oral mucosal membranes  Eyes: Anicteric  Lungs:  Mild diffuse wheezing, right lower crackles.  Heart: Regular rate and rhythm  Abdomen:  Soft, nontender, obese  Extremities: + peripheral edema.  Neurologic: Nonfocal, moving all four extremities  Skin: No lesions, dry lower extremities  Access: Lt AVF (maturing)    Basic Metabolic Panel: Recent Labs  Lab 06/12/22 1707 06/13/22 0619 06/13/22 1812 06/14/22 0506 06/15/22 0658 06/16/22 0734  NA 142 140 137 138 134* 133*  K 4.1 4.1 4.5 4.6 4.8 4.8  CL 116* 113* 109 112* 107 107  CO2 14* 14* 16* 15* 15* 13*  GLUCOSE 93 100* 104* 100*  99 92  BUN 55* 61* 63* 66* 73* 77*  CREATININE 4.01* 4.49* 4.80* 4.54* 4.81* 4.86*  CALCIUM 5.9* 6.2* 6.6* 6.9* 7.5* 7.6*  MG 1.0* 1.3* 1.1* 2.2 1.9 1.6*  PHOS 5.8* 5.4* 5.8* 6.1* 6.8*  --      Liver Function Tests: Recent Labs  Lab 06/10/22 0204 06/11/22 0651 06/12/22 0913 06/13/22 0619 06/13/22 1812 06/14/22 0506  AST 27 27 30 24   --  22  ALT 21 19 18 15   --  13  ALKPHOS 51 46 47 46  --  50  BILITOT 0.7 0.7 0.6 0.4  --  0.7  PROT 7.9 7.4 7.3 6.8  --  6.6  ALBUMIN 4.1 3.7 3.9 3.5 3.1* 3.2*    No results for input(s): "LIPASE", "AMYLASE" in the last 168 hours. No results for input(s): "AMMONIA" in the last 168 hours.  CBC: Recent Labs  Lab 06/10/22 0204 06/11/22 0651 06/12/22 0913 06/13/22 0619 06/14/22 0506 06/15/22 0658 06/16/22 0734  WBC 8.5 7.5 9.9 8.4 8.4 9.5 9.4  NEUTROABS 4.4 5.0 6.4 5.2 5.0  --   --   HGB 10.9* 10.4* 10.4* 9.5* 9.5* 9.1* 8.9*  HCT 33.7* 31.8* 31.9* 29.1* 29.8* 28.3* 27.5*  MCV 86.4 87.8 87.2 88.4 88.7 87.6 86.5  PLT 196 165 181 142* 160 160 161     Cardiac Enzymes: No results for input(s): "CKTOTAL", "CKMB", "CKMBINDEX", "TROPONINI" in the last 168 hours.  BNP: Invalid input(s): "POCBNP"  CBG: Recent Labs  Lab 06/15/22 0744 06/15/22 1202 06/15/22 1648 06/15/22 2106 06/16/22 0802  GLUCAP 98 103* 96 97  33     Microbiology: Results for orders placed or performed during the hospital encounter of 06/10/22  Culture, blood (routine x 2)     Status: None   Collection Time: 06/10/22  2:04 AM   Specimen: BLOOD RIGHT ARM  Result Value Ref Range Status   Specimen Description BLOOD RIGHT ARM  Final   Special Requests   Final    BOTTLES DRAWN AEROBIC AND ANAEROBIC Blood Culture adequate volume   Culture   Final    NO GROWTH 5 DAYS Performed at [facility], [address] [place], [place] [address]    Report Status 06/15/2022 FINAL  Final  Culture, blood (routine x 2)     Status: None   Collection Time: 06/10/22   2:04 AM   Specimen: BLOOD RIGHT ARM  Result Value Ref Range Status   Specimen Description BLOOD RIGHT ARM  Final   Special Requests   Final    BOTTLES DRAWN AEROBIC AND ANAEROBIC Blood Culture results may not be optimal due to an inadequate volume of blood received in culture bottles   Culture   Final    NO GROWTH 5 DAYS Performed at [facility], [address], [place], [place] [address]    Report Status 06/15/2022 FINAL  Final  Urine Culture     Status: None   Collection Time: 06/13/22  5:47 AM   Specimen: Urine, Clean Catch  Result Value Ref Range Status   Specimen Description   Final    URINE, CLEAN CATCH Performed at [facility], [address], [place], [place] [address]    Special Requests   Final    NONE Performed at  [facility], [address], [place], [place] [address]    Culture   Final    NO GROWTH Performed at [facility], [place] [address], [place], [place] [address]    Report Status 06/14/2022 FINAL  Final    Coagulation Studies: No results for input(s): "LABPROT", "INR" in the last 72 hours.  Urinalysis: No results for input(s): "COLORURINE", "LABSPEC", "PHURINE", "GLUCOSEU", "HGBUR", "BILIRUBINUR", "KETONESUR", "PROTEINUR", "UROBILINOGEN", "NITRITE", "LEUKOCYTESUR" in the last 72 hours.  Invalid input(s): "APPERANCEUR"     Imaging: No results found.   Medications:    lactated ringers Stopped (06/15/22 1207)   magnesium sulfate bolus IVPB     promethazine (PHENERGAN) injection (IM or IVPB) Stopped (06/11/22 1113)    amLODipine  5 mg Oral Daily   vitamin C  500 mg Oral Daily   azelastine  1 spray Each Nare BID   calcitRIOL  0.5 mcg Oral Daily   calcium carbonate  1 tablet Oral TID WC   diclofenac Sodium  2 g Topical QID   heparin  5,000 Units Subcutaneous Q8H   hydrALAZINE  100 mg Oral BID   insulin aspart  0-9 Units Subcutaneous TID WC   Ipratropium-Albuterol  1 puff Inhalation Q6H   levothyroxine  150  mcg Oral Q0600   lidocaine  1 patch Transdermal Q12H   loratadine  10 mg Oral Daily   metoprolol tartrate  50 mg Oral BID   molnupiravir EUA  4 capsule Oral BID   montelukast  10 mg Oral QHS   multivitamin with minerals  1 tablet Oral Daily   pantoprazole  40 mg Oral QAC breakfast   scopolamine  1 patch Transdermal Q72H   sertraline  100 mg Oral Daily   zinc sulfate  220 mg Oral Daily   acetaminophen, albuterol,  chlorpheniramine-HYDROcodone, guaiFENesin-dextromethorphan, hydrALAZINE, HYDROcodone-acetaminophen, loperamide, metoprolol tartrate, polyethylene glycol, promethazine (PHENERGAN) injection (IM or IVPB), senna-docusate, traZODone  Assessment/ Plan:  [name] is a 81 y.o.  female with medical concerns including GERD, diabetes, hypertension, dyslipidemia, hypothyroidism, and chronic kidney disease stage IV.  Patient presents to the emergency department complaining of shortness of breath and has been admitted for Hypocalcemia [E83.51] Generalized weakness [R53.1] Nausea vomiting and diarrhea [R11.2, R19.7] Pneumonia due to COVID-19 virus [U07.1, J12.82] COVID-19 [U07.1]   Acute Kidney Injury on chronic kidney disease stage 4 with baseline creatinine 2.7 and GFR of 17 on 05/09/2022.  Acute kidney injury secondary to hypovolemia and severe ATN secondary to concurrent infection. Chronic kidney disease is secondary to advanced age, hypertension and diabetes CT abdomen pelvis shows multiple renal cysts in right kidney, left kidney within normal limits with no obstruction. Serum creatinine remains critically elevated.  Urine output low.  Discussed with patient that if the trend continues, she may need hemodialysis.  She has tentatively agreed to it if necessary. IV fluids discontinued due to pulmonary congestion. Serum creatinine stable today and patient later improved with bronchodilators.  However, her renal function remains tedious and unless there is significant clinical  improvement, she may end up needing dialysis.    Lab Results  Component Value Date   CREATININE 4.86 (H) 06/16/2022   CREATININE 4.81 (H) 06/15/2022   CREATININE 4.54 (H) 06/14/2022    Intake/Output Summary (Last 24 hours) at 06/16/2022 0941 Last data filed at 06/16/2022 0515 Gross per 24 hour  Intake --  Output 300 ml  Net -300 ml     2. Hypocalcemia, severe.  Calcium 5.7 on admission. Continue IV and oral supplementation by primary team.  Currently receiving calcium carbonate 3 times daily along with daily calcitriol.  3. Anemia of chronic kidney disease Lab Results  Component Value Date   HGB 8.9 (L) 06/16/2022    Hemoglobin remains within desired target.  4. Hypertension with chronic kidney disease. Home regimen includes amlodipine, hydralazine, losartan, metoprolol, spironolactone, and torsemide.  Diuretics and losartan remain held in setting of kidney injury.  5. Diabetes mellitus type II with chronic kidney disease/renal manifestations: noninsulin dependent.Most recent hemoglobin A1c is 6.0% on June 14, 2022.  6.  COVID-19 pneumonia.  Patient completed ampicillin-sulbactam and doxycycline, per primary team.  Continue supportive care.   LOS: 6 Anan Dapolito 9/15/20239:41 AM

## 2022-06-16 NOTE — Progress Notes (Signed)
Pharmacy Electrolyte Monitoring Consult:  Pharmacy consulted to assist in monitoring and replacing electrolytes in this 81 y.o. female admitted on 06/10/2022 with Shortness of Breath Covid+  Labs:  Sodium (mmol/L)  Date Value  06/16/2022 133 (L)  08/11/2014 141  06/20/2012 142   Potassium (mmol/L)  Date Value  06/16/2022 4.8  06/20/2012 3.2 (L)   Magnesium (mg/dL)  Date Value  06/16/2022 1.6 (L)   Phosphorus (mg/dL)  Date Value  06/15/2022 6.8 (H)   Calcium (mg/dL)  Date Value  06/16/2022 7.6 (L)   Calcium, Total (mg/dL)  Date Value  06/20/2012 9.4   Albumin (g/dL)  Date Value  06/14/2022 3.2 (L)  06/20/2012 3.8   Medications: calcium carbonate 1212m PO TID, spironolactone 262mdaily, started on calcitriol 0.67m26mQD 9/11 -Holding losartan and torsemide  MIVF: on LR @75ml /h   Assessment/Plan: Mg. 1.6. MD ordered repletion. Other labs WNL. Ionized calcium pending BMP with AM labs   AndDarrick PennaharmD 06/16/2022 10:44 AM

## 2022-06-16 NOTE — Plan of Care (Signed)
  Problem: Education: Goal: Knowledge of risk factors and measures for prevention of condition will improve Outcome: Progressing   Problem: Coping: Goal: Psychosocial and spiritual needs will be supported Outcome: Progressing   Problem: Respiratory: Goal: Will maintain a patent airway Outcome: Progressing Goal: Complications related to the disease process, condition or treatment will be avoided or minimized Outcome: Progressing   Problem: Education: Goal: Knowledge of General Education information will improve Description: Including pain rating scale, medication(s)/side effects and non-pharmacologic comfort measures Outcome: Progressing   Problem: Health Behavior/Discharge Planning: Goal: Ability to manage health-related needs will improve Outcome: Progressing   Problem: Clinical Measurements: Goal: Ability to maintain clinical measurements within normal limits will improve Outcome: Progressing Goal: Will remain free from infection Outcome: Progressing Goal: Diagnostic test results will improve Outcome: Progressing Goal: Respiratory complications will improve Outcome: Progressing Goal: Cardiovascular complication will be avoided Outcome: Progressing   Problem: Activity: Goal: Risk for activity intolerance will decrease Outcome: Progressing   Problem: Nutrition: Goal: Adequate nutrition will be maintained Outcome: Progressing   Problem: Coping: Goal: Level of anxiety will decrease Outcome: Progressing   Problem: Elimination: Goal: Will not experience complications related to bowel motility Outcome: Progressing Goal: Will not experience complications related to urinary retention Outcome: Progressing   Problem: Pain Managment: Goal: General experience of comfort will improve Outcome: Progressing   Problem: Safety: Goal: Ability to remain free from injury will improve Outcome: Progressing   Problem: Skin Integrity: Goal: Risk for impaired skin integrity will  decrease Outcome: Progressing   Problem: Education: Goal: Ability to describe self-care measures that may prevent or decrease complications (Diabetes Survival Skills Education) will improve Outcome: Progressing Goal: Individualized Educational Video(s) Outcome: Progressing   Problem: Coping: Goal: Ability to adjust to condition or change in health will improve Outcome: Progressing   Problem: Fluid Volume: Goal: Ability to maintain a balanced intake and output will improve Outcome: Progressing   Problem: Health Behavior/Discharge Planning: Goal: Ability to identify and utilize available resources and services will improve Outcome: Progressing Goal: Ability to manage health-related needs will improve Outcome: Progressing   Problem: Metabolic: Goal: Ability to maintain appropriate glucose levels will improve Outcome: Progressing   Problem: Nutritional: Goal: Maintenance of adequate nutrition will improve Outcome: Progressing Goal: Progress toward achieving an optimal weight will improve Outcome: Progressing   Problem: Skin Integrity: Goal: Risk for impaired skin integrity will decrease Outcome: Progressing   Problem: Tissue Perfusion: Goal: Adequacy of tissue perfusion will improve Outcome: Progressing   

## 2022-06-16 NOTE — Progress Notes (Signed)
PROGRESS NOTE    [name]  SHF:[id number] DOB: November 14, 1940 DOA: 06/10/2022 PCP: Kirk Ruths, MD   Brief Narrative:  81 y.o. Caucasian female with medical history significant for asthma, CHF, type diabetes mellitus, GERD, hypertension, dyslipidemia, CKD stage IV, hypothyroidism and OSA, who presented to the ER with acute onset of generalized weakness with associated nausea, vomiting and diarrhea. She has been having associated chest congestion and was recently diagnosed with COVID-19 on Monday.  She was started on p.o. molnupiravir.  Upon admission patient was noted to have worsening renal function concerns for ATN, nephrology team was consulted.  She had multiple electrolyte derangements requiring repletion.   Assessment & Plan:  Principal Problem:   Pneumonia due to COVID-19 virus Active Problems:   Hypocalcemia   Right shoulder pain   Essential hypertension   CKD (chronic kidney disease), stage IV (HCC)   Dyslipidemia   Depression   AKI (acute kidney injury) ([place])   Hypomagnesemia   Hyperphosphatemia     Assessment and Plan: * Pneumonia due to COVID-19 virus Abnormal Breath Sounds.  Concerns of superimposed aspiration pneumonia due to nausea and vomiting.  Currently on Molnupiravir continue and finish the course.  Completed 5-day course of Unasyn and doxycycline 9/14.  Cont IS/Flutter.  Scheduled bronchodilators.   AKI on CKD (chronic kidney disease), stage IV (HCC) - Cr Continues to climb up; today 4.86. Suspect ATN. Nephro to determine if she will need HD. No urinary retention on bladder scan. Stop Aldactone.  Start Na Bicarb? Will defer to nephro.   DM2 ISS and accuchecks.   Hypocalcemia -Prn repletion. Not on home meds.   Right shoulder pain - X-ray showing calcified tendinitis.  Pain control  Depression - On Zoloft  Dyslipidemia - Statin on hold while on Paxlovid   Essential hypertension - We will continue her  antihypertensives.  Hypomagnesemia - Repletion    DVT prophylaxis: SQ Heparin Code Status: Full Family Communication:   Status is: Inpatient Creatinine still elevated, nephro following.  Ongoing management  Subjective: Overall weak, but no new complaints.   Examination: Constitutional: Not in acute distress Respiratory: b/l rhonchi Cardiovascular: Normal sinus rhythm, no rubs Abdomen: Nontender nondistended good bowel sounds Musculoskeletal: No edema noted Skin: No rashes seen Neurologic: CN 2-12 grossly intact.  And nonfocal Psychiatric: Normal judgment and insight. Alert and oriented x 3. Normal mood.     Objective: Vitals:   06/15/22 0831 06/15/22 1304 06/15/22 1703 06/15/22 2050  BP: (!) 114/59  (!) 135/48 (!) 151/50  Pulse: (!) 52  (!) 58 63  Resp: 14  20 (!) 22  Temp: (!) 97.4 F (36.3 C)  97.6 F (36.4 C) 98.2 F (36.8 C)  TempSrc:    Oral  SpO2: 97% 95% 97% 95%  Weight:      Height:        Intake/Output Summary (Last 24 hours) at 06/16/2022 0834 Last data filed at 06/16/2022 0515 Gross per 24 hour  Intake --  Output 300 ml  Net -300 ml   Filed Weights   06/10/22 0141  Weight: 88.5 kg     Data Reviewed:   CBC: Recent Labs  Lab 06/10/22 0204 06/11/22 0651 06/12/22 0913 06/13/22 0619 06/14/22 0506 06/15/22 0658 06/16/22 0734  WBC 8.5 7.5 9.9 8.4 8.4 9.5 9.4  NEUTROABS 4.4 5.0 6.4 5.2 5.0  --   --   HGB 10.9* 10.4* 10.4* 9.5* 9.5* 9.1* 8.9*  HCT 33.7* 31.8* 31.9* 29.1* 29.8* 28.3* 27.5*  MCV 86.4  87.8 87.2 88.4 88.7 87.6 86.5  PLT 196 165 181 142* 160 160 941   Basic Metabolic Panel: Recent Labs  Lab 06/12/22 1707 06/13/22 0619 06/13/22 1812 06/14/22 0506 06/15/22 0658 06/16/22 0734  NA 142 140 137 138 134* 133*  K 4.1 4.1 4.5 4.6 4.8 4.8  CL 116* 113* 109 112* 107 107  CO2 14* 14* 16* 15* 15* 13*  GLUCOSE 93 100* 104* 100* 99 92  BUN 55* 61* 63* 66* 73* 77*  CREATININE 4.01* 4.49* 4.80* 4.54* 4.81* 4.86*  CALCIUM 5.9*  6.2* 6.6* 6.9* 7.5* 7.6*  MG 1.0* 1.3* 1.1* 2.2 1.9 1.6*  PHOS 5.8* 5.4* 5.8* 6.1* 6.8*  --    GFR: Estimated Creatinine Clearance: 9.4 mL/min (A) (by C-G formula based on SCr of 4.86 mg/dL (H)). Liver Function Tests: Recent Labs  Lab 06/10/22 0204 06/11/22 0651 06/12/22 0913 06/13/22 0619 06/13/22 1812 06/14/22 0506  AST 27 27 30 24   --  22  ALT 21 19 18 15   --  13  ALKPHOS 51 46 47 46  --  50  BILITOT 0.7 0.7 0.6 0.4  --  0.7  PROT 7.9 7.4 7.3 6.8  --  6.6  ALBUMIN 4.1 3.7 3.9 3.5 3.1* 3.2*   No results for input(s): "LIPASE", "AMYLASE" in the last 168 hours. No results for input(s): "AMMONIA" in the last 168 hours. Coagulation Profile: No results for input(s): "INR", "PROTIME" in the last 168 hours. Cardiac Enzymes: No results for input(s): "CKTOTAL", "CKMB", "CKMBINDEX", "TROPONINI" in the last 168 hours. BNP (last 3 results) No results for input(s): "PROBNP" in the last 8760 hours. HbA1C: Recent Labs    06/14/22 0506  HGBA1C 6.0*   CBG: Recent Labs  Lab 06/15/22 0744 06/15/22 1202 06/15/22 1648 06/15/22 2106 06/16/22 0802  GLUCAP 98 103* 96 97 91   Lipid Profile: No results for input(s): "CHOL", "HDL", "LDLCALC", "TRIG", "CHOLHDL", "LDLDIRECT" in the last 72 hours. Thyroid Function Tests: No results for input(s): "TSH", "T4TOTAL", "FREET4", "T3FREE", "THYROIDAB" in the last 72 hours. Anemia Panel: Recent Labs    06/14/22 0506 06/15/22 0658  FERRITIN 106 97   Sepsis Labs: Recent Labs  Lab 06/10/22 0204  PROCALCITON <0.10  LATICACIDVEN 1.0    Recent Results (from the past 240 hour(s))  Resp Panel by RT-PCR (Flu A&B, Covid) Anterior Nasal Swab     Status: Abnormal   Collection Time: 06/08/22  2:37 PM   Specimen: Anterior Nasal Swab  Result Value Ref Range Status   SARS Coronavirus 2 by RT PCR POSITIVE (A) NEGATIVE Final    Comment: (NOTE) SARS-CoV-2 target nucleic acids are DETECTED.  The SARS-CoV-2 RNA is generally detectable in upper  respiratory specimens during the acute phase of infection. Positive results are indicative of the presence of the identified virus, but do not rule out bacterial infection or co-infection with other pathogens not detected by the test. Clinical correlation with patient history and other diagnostic information is necessary to determine patient infection status. The expected result is Negative.  Fact Sheet for Patients: EntrepreneurPulse.com.au  Fact Sheet for Healthcare Providers: IncredibleEmployment.be  This test is not yet approved or cleared by the [place] FDA and  has been authorized for detection and/or diagnosis of SARS-CoV-2 by FDA under an Emergency Use Authorization (EUA).  This EUA will remain in effect (meaning this test can be used) for the duration of  the COVID-19 declaration under Section 564(b)(1) of the A ct, 21 U.S.C. section 360bbb-3(b)(1), unless the authorization  is terminated or revoked sooner.     Influenza A by PCR NEGATIVE NEGATIVE Final   Influenza B by PCR NEGATIVE NEGATIVE Final    Comment: (NOTE) The Xpert Xpress SARS-CoV-2/FLU/RSV plus assay is intended as an aid in the diagnosis of influenza from Nasopharyngeal swab specimens and should not be used as a sole basis for treatment. Nasal washings and aspirates are unacceptable for Xpert Xpress SARS-CoV-2/FLU/RSV testing.  Fact Sheet for Patients: EntrepreneurPulse.com.au  Fact Sheet for Healthcare Providers: IncredibleEmployment.be  This test is not yet approved or cleared by the [place] FDA and has been authorized for detection and/or diagnosis of SARS-CoV-2 by FDA under an Emergency Use Authorization (EUA). This EUA will remain in effect (meaning this test can be used) for the duration of the COVID-19 declaration under Section 564(b)(1) of the Act, 21 U.S.C. section 360bbb-3(b)(1), unless the authorization is  terminated or revoked.  Performed at [facility], [address] [place], [place] [address]   Culture, blood (routine x 2)     Status: None   Collection Time: 06/10/22  2:04 AM   Specimen: BLOOD RIGHT ARM  Result Value Ref Range Status   Specimen Description BLOOD RIGHT ARM  Final   Special Requests   Final    BOTTLES DRAWN AEROBIC AND ANAEROBIC Blood Culture adequate volume   Culture   Final    NO GROWTH 5 DAYS Performed at [facility], [address], [place], [place] [address]    Report Status 06/15/2022 FINAL  Final  Culture, blood (routine x 2)     Status: None   Collection Time: 06/10/22  2:04 AM   Specimen: BLOOD RIGHT ARM  Result Value Ref Range Status   Specimen Description BLOOD RIGHT ARM  Final   Special Requests   Final    BOTTLES DRAWN AEROBIC AND ANAEROBIC Blood Culture results may not be optimal due to an inadequate volume of blood received in culture bottles   Culture   Final    NO GROWTH 5 DAYS Performed at [facility], [address] [place], [place] [address]    Report Status 06/15/2022 FINAL  Final  Urine Culture     Status: None   Collection Time: 06/13/22  5:47 AM   Specimen: Urine, Clean Catch  Result Value Ref Range Status   Specimen Description   Final    URINE, CLEAN CATCH Performed at [facility], [address] [place], [place] [address]    Special Requests   Final    NONE Performed at [facility], [address], [place], [place] [address]    Culture   Final    NO GROWTH Performed at [facility], [place] [address], [place], [place] [address]    Report Status 06/14/2022 FINAL  Final         Radiology Studies: No results found.      Scheduled Meds:  amLODipine  5 mg Oral Daily   vitamin C  500 mg Oral Daily   azelastine  1 spray Each Nare BID   calcitRIOL  0.5 mcg Oral Daily   calcium carbonate  1 tablet Oral TID WC   diclofenac Sodium  2 g Topical QID   heparin   5,000 Units Subcutaneous Q8H   hydrALAZINE  100 mg Oral BID   insulin aspart  0-9 Units Subcutaneous TID WC   Ipratropium-Albuterol  1 puff Inhalation Q6H   levothyroxine  150 mcg Oral Q0600   lidocaine  1 patch Transdermal Q12H   loratadine  10 mg Oral Daily   metoprolol tartrate  50 mg Oral BID   molnupiravir EUA  4 capsule Oral BID   montelukast  10 mg Oral QHS   multivitamin with minerals  1 tablet Oral Daily   pantoprazole  40 mg Oral QAC breakfast   scopolamine  1 patch Transdermal Q72H   sertraline  100 mg Oral Daily   zinc sulfate  220 mg Oral Daily   Continuous Infusions:  lactated ringers Stopped (06/15/22 1207)   magnesium sulfate bolus IVPB     promethazine (PHENERGAN) injection (IM or IVPB) Stopped (06/11/22 1113)     LOS: 6 days   Time spent= 35 mins    Rheana Casebolt Arsenio Loader, MD [facility]  If 7PM-7AM, please contact night-coverage  06/16/2022, 8:34 AM

## 2022-06-16 NOTE — TOC Progression Note (Signed)
Transition of Care [facility]) - Progression Note    Patient Details  Name: AZAYLA POLO MRN: [id number] Date of Birth: [birth year]  Transition of Care [facility]) CM/SW [place], RN Phone Number: 06/16/2022, 1:04 PM  Clinical Narrative:    Received a call from [place], they will see the patient same day as DC once [place] orders are in   Expected Discharge Plan: [place] Barriers to Discharge: Continued Medical Work up  Expected Discharge Plan and Services Expected Discharge Plan: [place]   Discharge Planning Services: CM Consult   Living arrangements for the past 2 months: Single Family Home                 DME Arranged: Shower stool DME Agency: [employer] Date DME Agency Contacted: 06/13/22 Time DME Agency Contacted: [id number] Representative spoke with at DME Agency: [place]: PT [place]: [place] Date [place]: 06/13/22 Time [place]: 59 Representative spoke with at [place]: [place] Determinants of Health ([place]) Interventions    Readmission Risk Interventions     No data to display

## 2022-06-17 ENCOUNTER — Inpatient Hospital Stay: Payer: Medicare Other

## 2022-06-17 DIAGNOSIS — U071 COVID-19: Secondary | ICD-10-CM | POA: Diagnosis not present

## 2022-06-17 DIAGNOSIS — J1282 Pneumonia due to coronavirus disease 2019: Secondary | ICD-10-CM | POA: Diagnosis not present

## 2022-06-17 LAB — CBC
HCT: 27.7 % — ABNORMAL LOW (ref 36.0–46.0)
Hemoglobin: 8.8 g/dL — ABNORMAL LOW (ref 12.0–15.0)
MCH: 27.8 pg (ref 26.0–34.0)
MCHC: 31.8 g/dL (ref 30.0–36.0)
MCV: 87.7 fL (ref 80.0–100.0)
Platelets: 160 10*3/uL (ref 150–400)
RBC: 3.16 MIL/uL — ABNORMAL LOW (ref 3.87–5.11)
RDW: 14.6 % (ref 11.5–15.5)
WBC: 9.1 10*3/uL (ref 4.0–10.5)
nRBC: 0 % (ref 0.0–0.2)

## 2022-06-17 LAB — GLUCOSE, CAPILLARY
Glucose-Capillary: 95 mg/dL (ref 70–99)
Glucose-Capillary: 125 mg/dL — ABNORMAL HIGH (ref 70–99)
Glucose-Capillary: 113 mg/dL — ABNORMAL HIGH (ref 70–99)
Glucose-Capillary: 118 mg/dL — ABNORMAL HIGH (ref 70–99)

## 2022-06-17 LAB — BASIC METABOLIC PANEL
Anion gap: 14 (ref 5–15)
BUN: 85 mg/dL — ABNORMAL HIGH (ref 8–23)
CO2: 13 mmol/L — ABNORMAL LOW (ref 22–32)
Calcium: 8.2 mg/dL — ABNORMAL LOW (ref 8.9–10.3)
Chloride: 107 mmol/L (ref 98–111)
Creatinine, Ser: 4.35 mg/dL — ABNORMAL HIGH (ref 0.44–1.00)
GFR, Estimated: 10 mL/min — ABNORMAL LOW (ref >60)
Glucose, Bld: 99 mg/dL (ref 70–99)
Potassium: 4.7 mmol/L (ref 3.5–5.1)
Sodium: 134 mmol/L — ABNORMAL LOW (ref 135–145)

## 2022-06-17 LAB — MAGNESIUM: Magnesium: 2 mg/dL (ref 1.7–2.4)

## 2022-06-17 MED ORDER — FUROSEMIDE 10 MG/ML IJ SOLN
40.0000 mg | Freq: Once | INTRAMUSCULAR | Status: AC
  Administered 2022-06-17: 40 mg via INTRAVENOUS
  Filled 2022-06-17: qty 4

## 2022-06-17 NOTE — Progress Notes (Signed)
[facility]  ROUNDING NOTE   Subjective:   [name] is a 81 year old female with medical concerns including GERD, diabetes, hypertension, dyslipidemia, hypothyroidism, and chronic kidney disease stage IV.  Patient presents to the emergency department complaining of shortness of breath and has been admitted for Hypocalcemia [E83.51] Generalized weakness [R53.1] Nausea vomiting and diarrhea [R11.2, R19.7] Pneumonia due to COVID-19 virus [U07.1, J12.82] COVID-19 [U07.1]  Patient is known to our practice and is followed outpatient by Dr. Juleen China.    Patient seen sitting up in bed, husband at bedside Alert and oriented Shortness of breath temporarily improved with nebulizer treatments.  Remains on 3 L nasal cannula   Objective:  Vital signs in last 24 hours:  Temp:  [97.8 F (36.6 C)-98.6 F (37 C)] 98.1 F (36.7 C) (09/16 1136) Pulse Rate:  [60-85] 62 (09/16 1136) Resp:  [15-22] 16 (09/16 1136) BP: (135-154)/(47-74) 143/51 (09/16 1136) SpO2:  [92 %-96 %] 95 % (09/16 1136)  Weight change:  Filed Weights   06/10/22 0141  Weight: 88.5 kg    Intake/Output: I/O last 3 completed shifts: In: 290 [P.O.:240; IV Piggyback:50] Out: 100 [Urine:100]   Intake/Output this shift:  Total I/O In: 120 [P.O.:120] Out: 350 [Urine:350]  Physical Exam: General: NAD, resting comfortably  Head: Normocephalic, atraumatic. Moist oral mucosal membranes  Eyes: Anicteric  Lungs:  Mild diffuse wheezing, right lower crackles.  Heart: Regular rate and rhythm  Abdomen:  Soft, nontender, obese  Extremities: + peripheral edema.  Neurologic: Nonfocal, moving all four extremities  Skin: No lesions, dry lower extremities  Access: Lt AVF (maturing)    Basic Metabolic Panel: Recent Labs  Lab 06/12/22 1707 06/13/22 9233 06/13/22 1812 06/14/22 0506 06/15/22 0658 06/16/22 0734 06/17/22 0603  NA 142 140 137 138 134* 133* 134*  K 4.1 4.1 4.5 4.6 4.8 4.8 4.7  CL 116* 113* 109  112* 107 107 107  CO2 14* 14* 16* 15* 15* 13* 13*  GLUCOSE 93 100* 104* 100* 99 92 99  BUN 55* 61* 63* 66* 73* 77* 85*  CREATININE 4.01* 4.49* 4.80* 4.54* 4.81* 4.86* 4.35*  CALCIUM 5.9* 6.2* 6.6* 6.9* 7.5* 7.6* 8.2*  MG 1.0* 1.3* 1.1* 2.2 1.9 1.6* 2.0  PHOS 5.8* 5.4* 5.8* 6.1* 6.8*  --   --      Liver Function Tests: Recent Labs  Lab 06/11/22 0651 06/12/22 0913 06/13/22 0619 06/13/22 1812 06/14/22 0506  AST 27 30 24   --  22  ALT 19 18 15   --  13  ALKPHOS 46 47 46  --  50  BILITOT 0.7 0.6 0.4  --  0.7  PROT 7.4 7.3 6.8  --  6.6  ALBUMIN 3.7 3.9 3.5 3.1* 3.2*    No results for input(s): "LIPASE", "AMYLASE" in the last 168 hours. No results for input(s): "AMMONIA" in the last 168 hours.  CBC: Recent Labs  Lab 06/11/22 0651 06/12/22 0913 06/13/22 0619 06/14/22 0506 06/15/22 0658 06/16/22 0734 06/17/22 0603  WBC 7.5 9.9 8.4 8.4 9.5 9.4 9.1  NEUTROABS 5.0 6.4 5.2 5.0  --   --   --   HGB 10.4* 10.4* 9.5* 9.5* 9.1* 8.9* 8.8*  HCT 31.8* 31.9* 29.1* 29.8* 28.3* 27.5* 27.7*  MCV 87.8 87.2 88.4 88.7 87.6 86.5 87.7  PLT 165 181 142* 160 160 161 160     Cardiac Enzymes: No results for input(s): "CKTOTAL", "CKMB", "CKMBINDEX", "TROPONINI" in the last 168 hours.  BNP: Invalid input(s): "POCBNP"  CBG: Recent Labs  Lab 06/16/22 0802 06/16/22 1216 06/16/22 1712 06/16/22 2121 06/17/22 0759  GLUCAP 91 93 108* 102* 95     Microbiology: Results for orders placed or performed during the hospital encounter of 06/10/22  Culture, blood (routine x 2)     Status: None   Collection Time: 06/10/22  2:04 AM   Specimen: BLOOD RIGHT ARM  Result Value Ref Range Status   Specimen Description BLOOD RIGHT ARM  Final   Special Requests   Final    BOTTLES DRAWN AEROBIC AND ANAEROBIC Blood Culture adequate volume   Culture   Final    NO GROWTH 5 DAYS Performed at [facility], [address] [place], [place] [address]    Report Status 06/15/2022 FINAL  Final   Culture, blood (routine x 2)     Status: None   Collection Time: 06/10/22  2:04 AM   Specimen: BLOOD RIGHT ARM  Result Value Ref Range Status   Specimen Description BLOOD RIGHT ARM  Final   Special Requests   Final    BOTTLES DRAWN AEROBIC AND ANAEROBIC Blood Culture results may not be optimal due to an inadequate volume of blood received in culture bottles   Culture   Final    NO GROWTH 5 DAYS Performed at [facility], [address] [place], [place] [address]    Report Status 06/15/2022 FINAL  Final  Urine Culture     Status: None   Collection Time: 06/13/22  5:47 AM   Specimen: Urine, Clean Catch  Result Value Ref Range Status   Specimen Description   Final    URINE, CLEAN CATCH Performed at [facility], [address], [place], [place] [address]    Special Requests   Final    NONE Performed at [facility], [address], [place], [place] [address]    Culture   Final    NO GROWTH Performed at [facility], [place] [address] [place], [place] [address]    Report Status 06/14/2022 FINAL  Final    Coagulation Studies: No results for input(s): "LABPROT", "INR" in the last 72 hours.  Urinalysis: No results for input(s): "COLORURINE", "LABSPEC", "PHURINE", "GLUCOSEU", "HGBUR", "BILIRUBINUR", "KETONESUR", "PROTEINUR", "UROBILINOGEN", "NITRITE", "LEUKOCYTESUR" in the last 72 hours.  Invalid input(s): "APPERANCEUR"     Imaging: DG Chest Port 1 View  Result Date: 06/17/2022 CLINICAL DATA:  Generalized weakness with associated nausea, vomiting and diarrhea. Recent diagnosis of COVID-19. EXAM: PORTABLE CHEST 1 VIEW COMPARISON:  06/10/2022. FINDINGS: Cardiac silhouette mildly enlarged.  No mediastinal or hilar masses. Bilateral vascular congestion and interstitial prominence. Linear type opacities noted at the lung bases consistent with atelectasis. Additional opacity at both lung bases, left greater than right, consistent with small  effusions. Remainder of the lungs is clear.  No pneumothorax. Skeletal structures are grossly intact. IMPRESSION: 1. Cardiomegaly, vascular congestion and interstitial prominence associated with small bilateral effusions suggest mild congestive heart failure. Electronically Signed   By: Lajean Manes M.D.   On: 06/17/2022 11:23     Medications:    promethazine (PHENERGAN) injection (IM or IVPB) Stopped (06/11/22 1113)    amLODipine  5 mg Oral Daily   vitamin C  500 mg Oral Daily   azelastine  1 spray Each Nare BID   calcitRIOL  0.5 mcg Oral Daily   calcium carbonate  1 tablet Oral TID WC   diclofenac Sodium  2 g Topical QID   furosemide  40 mg Intravenous Once   heparin  5,000 Units Subcutaneous Q8H   hydrALAZINE  100 mg Oral BID   insulin aspart  0-9 Units Subcutaneous TID WC   Ipratropium-Albuterol  1 puff Inhalation Q6H   levothyroxine  150 mcg Oral Q0600   lidocaine  1 patch Transdermal Q12H   loratadine  10 mg Oral Daily   metoprolol tartrate  50 mg Oral BID   montelukast  10 mg Oral QHS   multivitamin with minerals  1 tablet Oral Daily   pantoprazole  40 mg Oral QAC breakfast   scopolamine  1 patch Transdermal Q72H   sertraline  100 mg Oral Daily   zinc sulfate  220 mg Oral Daily   acetaminophen, albuterol, chlorpheniramine-HYDROcodone, guaiFENesin-dextromethorphan, hydrALAZINE, HYDROcodone-acetaminophen, loperamide, metoprolol tartrate, polyethylene glycol, promethazine (PHENERGAN) injection (IM or IVPB), senna-docusate, traZODone  Assessment/ Plan:  Ms. STAPHANIE HARBISON is a 81 y.o.  female with medical concerns including GERD, diabetes, hypertension, dyslipidemia, hypothyroidism, and chronic kidney disease stage IV.  Patient presents to the emergency department complaining of shortness of breath and has been admitted for Hypocalcemia [E83.51] Generalized weakness [R53.1] Nausea vomiting and diarrhea [R11.2, R19.7] Pneumonia due to COVID-19 virus [U07.1, J12.82] COVID-19  [U07.1]   Acute Kidney Injury on chronic kidney disease stage 4 with baseline creatinine 2.7 and GFR of 17 on 05/09/2022.  Acute kidney injury secondary to hypovolemia and severe ATN secondary to concurrent infection. Chronic kidney disease is secondary to advanced age, hypertension and diabetes CT abdomen pelvis shows multiple renal cysts in right kidney, left kidney within normal limits with no obstruction. Serum creatinine remains critically elevated.  Urine output low.  Discussed with patient that if the trend continues, she may need hemodialysis.  She has tentatively agreed to it if necessary. Renal function slightly improved with IV fluids stopped.  No improvement noted in respiratory status.  Patient given 1 dose IV furosemide 40 mg this morning and yesterday.  No measurable urine output recorded.  We will continue to monitor.    Lab Results  Component Value Date   CREATININE 4.35 (H) 06/17/2022   CREATININE 4.86 (H) 06/16/2022   CREATININE 4.81 (H) 06/15/2022    Intake/Output Summary (Last 24 hours) at 06/17/2022 1252 Last data filed at 06/17/2022 1147 Gross per 24 hour  Intake 290 ml  Output 350 ml  Net -60 ml     2. Hypocalcemia, severe.  Calcium 5.7 on admission.   Calcium slowly improving, 8.2 today.  Continue calcium carbonate 3 times daily along with daily calcitriol.  3. Anemia of chronic kidney disease Lab Results  Component Value Date   HGB 8.8 (L) 06/17/2022    Hemoglobin just below desired target.  We will continue to monitor.  4. Hypertension with chronic kidney disease. Home regimen includes amlodipine, hydralazine, losartan, metoprolol, spironolactone, and torsemide.  Scheduled doses of remaining healed.  Patient will receive 40 mg IV furosemide today.  5. Diabetes mellitus type II with chronic kidney disease/renal manifestations: noninsulin dependent.Most recent hemoglobin A1c is 6.0% on June 14, 2022.  6.  COVID-19 pneumonia.  Patient completed  ampicillin-sulbactam and doxycycline, per primary team.  Continue supportive care.   LOS: 7 Menna Abeln 9/16/202312:52 PM

## 2022-06-17 NOTE — Progress Notes (Signed)
PROGRESS NOTE    [name]  FYT:[id number] DOB: [birth year] DOA: 06/10/2022 PCP: Kirk Ruths, MD   Brief Narrative:  81 y.o. Caucasian female with medical history significant for asthma, CHF, type diabetes mellitus, GERD, hypertension, dyslipidemia, CKD stage IV, hypothyroidism and OSA, who presented to the ER with acute onset of generalized weakness with associated nausea, vomiting and diarrhea. She has been having associated chest congestion and was recently diagnosed with COVID-19 on Monday.  She was started on p.o. molnupiravir.  Upon admission patient was noted to have worsening renal function concerns for ATN, nephrology team was consulted.  She had multiple electrolyte derangements requiring repletion.   Assessment & Plan:  Principal Problem:   Pneumonia due to COVID-19 virus Active Problems:   Hypocalcemia   Right shoulder pain   Essential hypertension   CKD (chronic kidney disease), stage IV (HCC)   Dyslipidemia   Depression   AKI (acute kidney injury) ([place])   Hypomagnesemia   Hyperphosphatemia     Assessment and Plan: * Pneumonia due to COVID-19 virus Abnormal Breath Sounds. Persist Concerns of superimposed aspiration pneumonia due to nausea and vomiting.  Currently on Molnupiravir continue and finish the course.  Completed 5-day course of Unasyn and doxycycline 9/14.  Bronchodilators, IS/Flutter.  Another dose of Lasix 20m iv today Repeat CXR today  AKI on CKD (chronic kidney disease), stage IV (HCC) - Cr Continues to climb up; Cr better after IV Lasix, 4.3. Suspect ATN. Nephro to determine if she will need HD. No urinary retention on bladder scan. Ordered One more dose of Lasix 432miv today   DM2 ISS and accuchecks.   Hypocalcemia -Prn repletion. Not on home meds.   Right shoulder pain - X-ray showing calcified tendinitis.  Pain control  Depression - On Zoloft  Dyslipidemia - Statin on hold while on Paxlovid   Essential hypertension - We  will continue her antihypertensives.  Hypomagnesemia - Repletion    DVT prophylaxis: SQ Heparin Code Status: Full Family Communication:   Status is: Inpatient Creatinine still elevated, nephro following.  Ongoing management  Subjective: Appears little more SOB today.  Poor effort with incentive spirometer  Examination: Constitutional: Not in acute distress; 3L [place]. Chronically ill.  Respiratory: diminished BS at the bases with exp wheezing.  Cardiovascular: Normal sinus rhythm, no rubs Abdomen: Nontender nondistended good bowel sounds Musculoskeletal: No edema noted Skin: No rashes seen Neurologic: CN 2-12 grossly intact.  And nonfocal Psychiatric: Normal judgment and insight. Alert and oriented x 3. Normal mood.    Objective: Vitals:   06/16/22 1710 06/16/22 1945 06/17/22 0551 06/17/22 0912  BP: (!) 137/47 (!) 154/55 (!) 148/74 (!) 135/52  Pulse: 60 65 85 63  Resp: 15 (!) 22 (!) 22 18  Temp: 98.2 F (36.8 C) 98.6 F (37 C) 97.8 F (36.6 C) 98.4 F (36.9 C)  TempSrc:   Oral   SpO2: 96% 92% 96% 94%  Weight:      Height:        Intake/Output Summary (Last 24 hours) at 06/17/2022 1014 Last data filed at 06/16/2022 1551 Gross per 24 hour  Intake 290 ml  Output --  Net 290 ml   Filed Weights   06/10/22 0141  Weight: 88.5 kg     Data Reviewed:   CBC: Recent Labs  Lab 06/11/22 0651 06/12/22 0913 06/13/22 0619 06/14/22 0506 06/15/22 0658 06/16/22 0734 06/17/22 0603  WBC 7.5 9.9 8.4 8.4 9.5 9.4 9.1  NEUTROABS 5.0 6.4 5.2 5.0  --   --   --  HGB 10.4* 10.4* 9.5* 9.5* 9.1* 8.9* 8.8*  HCT 31.8* 31.9* 29.1* 29.8* 28.3* 27.5* 27.7*  MCV 87.8 87.2 88.4 88.7 87.6 86.5 87.7  PLT 165 181 142* 160 160 161 150   Basic Metabolic Panel: Recent Labs  Lab 06/12/22 1707 06/13/22 0619 06/13/22 1812 06/14/22 0506 06/15/22 0658 06/16/22 0734 06/17/22 0603  NA 142 140 137 138 134* 133* 134*  K 4.1 4.1 4.5 4.6 4.8 4.8 4.7  CL 116* 113* 109 112* 107 107 107   CO2 14* 14* 16* 15* 15* 13* 13*  GLUCOSE 93 100* 104* 100* 99 92 99  BUN 55* 61* 63* 66* 73* 77* 85*  CREATININE 4.01* 4.49* 4.80* 4.54* 4.81* 4.86* 4.35*  CALCIUM 5.9* 6.2* 6.6* 6.9* 7.5* 7.6* 8.2*  MG 1.0* 1.3* 1.1* 2.2 1.9 1.6* 2.0  PHOS 5.8* 5.4* 5.8* 6.1* 6.8*  --   --    GFR: Estimated Creatinine Clearance: 10.5 mL/min (A) (by C-G formula based on SCr of 4.35 mg/dL (H)). Liver Function Tests: Recent Labs  Lab 06/11/22 0651 06/12/22 0913 06/13/22 0619 06/13/22 1812 06/14/22 0506  AST 27 30 24   --  22  ALT 19 18 15   --  13  ALKPHOS 46 47 46  --  50  BILITOT 0.7 0.6 0.4  --  0.7  PROT 7.4 7.3 6.8  --  6.6  ALBUMIN 3.7 3.9 3.5 3.1* 3.2*   No results for input(s): "LIPASE", "AMYLASE" in the last 168 hours. No results for input(s): "AMMONIA" in the last 168 hours. Coagulation Profile: No results for input(s): "INR", "PROTIME" in the last 168 hours. Cardiac Enzymes: No results for input(s): "CKTOTAL", "CKMB", "CKMBINDEX", "TROPONINI" in the last 168 hours. BNP (last 3 results) No results for input(s): "PROBNP" in the last 8760 hours. HbA1C: No results for input(s): "HGBA1C" in the last 72 hours.  CBG: Recent Labs  Lab 06/16/22 0802 06/16/22 1216 06/16/22 1712 06/16/22 2121 06/17/22 0759  GLUCAP 91 93 108* 102* 95   Lipid Profile: No results for input(s): "CHOL", "HDL", "LDLCALC", "TRIG", "CHOLHDL", "LDLDIRECT" in the last 72 hours. Thyroid Function Tests: No results for input(s): "TSH", "T4TOTAL", "FREET4", "T3FREE", "THYROIDAB" in the last 72 hours. Anemia Panel: Recent Labs    06/15/22 0658  FERRITIN 97   Sepsis Labs: No results for input(s): "PROCALCITON", "LATICACIDVEN" in the last 168 hours.   Recent Results (from the past 240 hour(s))  Resp Panel by RT-PCR (Flu A&B, Covid) Anterior Nasal Swab     Status: Abnormal   Collection Time: 06/08/22  2:37 PM   Specimen: Anterior Nasal Swab  Result Value Ref Range Status   SARS Coronavirus 2 by RT PCR  POSITIVE (A) NEGATIVE Final    Comment: (NOTE) SARS-CoV-2 target nucleic acids are DETECTED.  The SARS-CoV-2 RNA is generally detectable in upper respiratory specimens during the acute phase of infection. Positive results are indicative of the presence of the identified virus, but do not rule out bacterial infection or co-infection with other pathogens not detected by the test. Clinical correlation with patient history and other diagnostic information is necessary to determine patient infection status. The expected result is Negative.  Fact Sheet for Patients: EntrepreneurPulse.com.au  Fact Sheet for Healthcare Providers: IncredibleEmployment.be  This test is not yet approved or cleared by the [place] FDA and  has been authorized for detection and/or diagnosis of SARS-CoV-2 by FDA under an Emergency Use Authorization (EUA).  This EUA will remain in effect (meaning this test can be used) for the  duration of  the COVID-19 declaration under Section 564(b)(1) of the A ct, 21 U.S.C. section 360bbb-3(b)(1), unless the authorization is terminated or revoked sooner.     Influenza A by PCR NEGATIVE NEGATIVE Final   Influenza B by PCR NEGATIVE NEGATIVE Final    Comment: (NOTE) The Xpert Xpress SARS-CoV-2/FLU/RSV plus assay is intended as an aid in the diagnosis of influenza from Nasopharyngeal swab specimens and should not be used as a sole basis for treatment. Nasal washings and aspirates are unacceptable for Xpert Xpress SARS-CoV-2/FLU/RSV testing.  Fact Sheet for Patients: EntrepreneurPulse.com.au  Fact Sheet for Healthcare Providers: IncredibleEmployment.be  This test is not yet approved or cleared by the [place] FDA and has been authorized for detection and/or diagnosis of SARS-CoV-2 by FDA under an Emergency Use Authorization (EUA). This EUA will remain in effect (meaning this test can be used)  for the duration of the COVID-19 declaration under Section 564(b)(1) of the Act, 21 U.S.C. section 360bbb-3(b)(1), unless the authorization is terminated or revoked.  Performed at [facility], [address] [place], [place] [address]   Culture, blood (routine x 2)     Status: None   Collection Time: 06/10/22  2:04 AM   Specimen: BLOOD RIGHT ARM  Result Value Ref Range Status   Specimen Description BLOOD RIGHT ARM  Final   Special Requests   Final    BOTTLES DRAWN AEROBIC AND ANAEROBIC Blood Culture adequate volume   Culture   Final    NO GROWTH 5 DAYS Performed at [facility], [address] [place], [place] [address]    Report Status 06/15/2022 FINAL  Final  Culture, blood (routine x 2)     Status: None   Collection Time: 06/10/22  2:04 AM   Specimen: BLOOD RIGHT ARM  Result Value Ref Range Status   Specimen Description BLOOD RIGHT ARM  Final   Special Requests   Final    BOTTLES DRAWN AEROBIC AND ANAEROBIC Blood Culture results may not be optimal due to an inadequate volume of blood received in culture bottles   Culture   Final    NO GROWTH 5 DAYS Performed at [facility], [address] [place], [place] [address]    Report Status 06/15/2022 FINAL  Final  Urine Culture     Status: None   Collection Time: 06/13/22  5:47 AM   Specimen: Urine, Clean Catch  Result Value Ref Range Status   Specimen Description   Final    URINE, CLEAN CATCH Performed at [facility], [address], [place], [place] [address]    Special Requests   Final    NONE Performed at [facility], [address] [place], [place] [address]    Culture   Final    NO GROWTH Performed at [facility], [place] [address], [place], [place] [address]    Report Status 06/14/2022 FINAL  Final         Radiology Studies: No results found.      Scheduled Meds:  amLODipine  5 mg Oral Daily   vitamin C  500 mg Oral Daily   azelastine  1  spray Each Nare BID   calcitRIOL  0.5 mcg Oral Daily   calcium carbonate  1 tablet Oral TID WC   diclofenac Sodium  2 g Topical QID   heparin  5,000 Units Subcutaneous Q8H   hydrALAZINE  100 mg Oral BID   insulin aspart  0-9 Units Subcutaneous TID  WC   Ipratropium-Albuterol  1 puff Inhalation Q6H   levothyroxine  150 mcg Oral Q0600   lidocaine  1 patch Transdermal Q12H   loratadine  10 mg Oral Daily   metoprolol tartrate  50 mg Oral BID   montelukast  10 mg Oral QHS   multivitamin with minerals  1 tablet Oral Daily   pantoprazole  40 mg Oral QAC breakfast   scopolamine  1 patch Transdermal Q72H   sertraline  100 mg Oral Daily   zinc sulfate  220 mg Oral Daily   Continuous Infusions:  promethazine (PHENERGAN) injection (IM or IVPB) Stopped (06/11/22 1113)     LOS: 7 days   Time spent= 35 mins    Kalissa Grays Arsenio Loader, MD [facility]  If 7PM-7AM, please contact night-coverage  06/17/2022, 10:14 AM

## 2022-06-17 NOTE — Progress Notes (Signed)
Pharmacy Electrolyte Monitoring Consult:  Pharmacy consulted to assist in monitoring and replacing electrolytes in this 80 y.o. female admitted on 06/10/2022 with Shortness of Breath Covid+  Labs:  Sodium (mmol/L)  Date Value  06/17/2022 134 (L)  08/11/2014 141  06/20/2012 142   Potassium (mmol/L)  Date Value  06/17/2022 4.7  06/20/2012 3.2 (L)   Magnesium (mg/dL)  Date Value  06/17/2022 2.0   Phosphorus (mg/dL)  Date Value  06/15/2022 6.8 (H)   Calcium (mg/dL)  Date Value  06/17/2022 8.2 (L)   Calcium, Total (mg/dL)  Date Value  06/20/2012 9.4   Albumin (g/dL)  Date Value  06/14/2022 3.2 (L)  06/20/2012 3.8   Medications: calcium carbonate 1241m PO TID, started on calcitriol 0.527m QD 9/11 -Holding losartan and torsemide, and spironolactone.  - lasix x 1 IV 9/16.     Assessment/Plan: No replacement needed at this time.  BMP with AM labs   [name] HillockPharmD 06/17/2022 8:18 AM

## 2022-06-17 NOTE — Plan of Care (Signed)
  Problem: Education: Goal: Knowledge of risk factors and measures for prevention of condition will improve Outcome: Progressing   Problem: Coping: Goal: Psychosocial and spiritual needs will be supported Outcome: Progressing   Problem: Respiratory: Goal: Will maintain a patent airway Outcome: Progressing Goal: Complications related to the disease process, condition or treatment will be avoided or minimized Outcome: Progressing   Problem: Education: Goal: Knowledge of General Education information will improve Description: Including pain rating scale, medication(s)/side effects and non-pharmacologic comfort measures Outcome: Progressing   Problem: Health Behavior/Discharge Planning: Goal: Ability to manage health-related needs will improve Outcome: Progressing   Problem: Clinical Measurements: Goal: Ability to maintain clinical measurements within normal limits will improve Outcome: Progressing Goal: Will remain free from infection Outcome: Progressing Goal: Diagnostic test results will improve Outcome: Progressing Goal: Respiratory complications will improve Outcome: Progressing Goal: Cardiovascular complication will be avoided Outcome: Progressing   Problem: Activity: Goal: Risk for activity intolerance will decrease Outcome: Progressing   Problem: Nutrition: Goal: Adequate nutrition will be maintained Outcome: Progressing   Problem: Coping: Goal: Level of anxiety will decrease Outcome: Progressing   Problem: Elimination: Goal: Will not experience complications related to bowel motility Outcome: Progressing Goal: Will not experience complications related to urinary retention Outcome: Progressing   Problem: Pain Managment: Goal: General experience of comfort will improve Outcome: Progressing   Problem: Safety: Goal: Ability to remain free from injury will improve Outcome: Progressing   Problem: Skin Integrity: Goal: Risk for impaired skin integrity will  decrease Outcome: Progressing   Problem: Education: Goal: Ability to describe self-care measures that may prevent or decrease complications (Diabetes Survival Skills Education) will improve Outcome: Progressing Goal: Individualized Educational Video(s) Outcome: Progressing   Problem: Coping: Goal: Ability to adjust to condition or change in health will improve Outcome: Progressing   Problem: Fluid Volume: Goal: Ability to maintain a balanced intake and output will improve Outcome: Progressing   Problem: Health Behavior/Discharge Planning: Goal: Ability to identify and utilize available resources and services will improve Outcome: Progressing Goal: Ability to manage health-related needs will improve Outcome: Progressing   Problem: Metabolic: Goal: Ability to maintain appropriate glucose levels will improve Outcome: Progressing   Problem: Nutritional: Goal: Maintenance of adequate nutrition will improve Outcome: Progressing Goal: Progress toward achieving an optimal weight will improve Outcome: Progressing   Problem: Skin Integrity: Goal: Risk for impaired skin integrity will decrease Outcome: Progressing   Problem: Tissue Perfusion: Goal: Adequacy of tissue perfusion will improve Outcome: Progressing   

## 2022-06-17 NOTE — Plan of Care (Signed)
  Problem: Education: Goal: Knowledge of risk factors and measures for prevention of condition will improve Outcome: Progressing   Problem: Respiratory: Goal: Will maintain a patent airway Outcome: Progressing Goal: Complications related to the disease process, condition or treatment will be avoided or minimized Outcome: Progressing   Problem: Education: Goal: Knowledge of General Education information will improve Description: Including pain rating scale, medication(s)/side effects and non-pharmacologic comfort measures Outcome: Progressing   Problem: Activity: Goal: Risk for activity intolerance will decrease Outcome: Progressing   Problem: Nutrition: Goal: Adequate nutrition will be maintained Outcome: Progressing   Problem: Elimination: Goal: Will not experience complications related to bowel motility Outcome: Progressing Goal: Will not experience complications related to urinary retention Outcome: Progressing   Problem: Coping: Goal: Level of anxiety will decrease Outcome: Progressing

## 2022-06-18 DIAGNOSIS — U071 COVID-19: Secondary | ICD-10-CM | POA: Diagnosis not present

## 2022-06-18 DIAGNOSIS — J1282 Pneumonia due to coronavirus disease 2019: Secondary | ICD-10-CM | POA: Diagnosis not present

## 2022-06-18 LAB — BASIC METABOLIC PANEL
Anion gap: 15 (ref 5–15)
BUN: 92 mg/dL — ABNORMAL HIGH (ref 8–23)
CO2: 15 mmol/L — ABNORMAL LOW (ref 22–32)
Calcium: 8.4 mg/dL — ABNORMAL LOW (ref 8.9–10.3)
Chloride: 106 mmol/L (ref 98–111)
Creatinine, Ser: 3.58 mg/dL — ABNORMAL HIGH (ref 0.44–1.00)
GFR, Estimated: 12 mL/min — ABNORMAL LOW (ref >60)
Glucose, Bld: 100 mg/dL — ABNORMAL HIGH (ref 70–99)
Potassium: 4.2 mmol/L (ref 3.5–5.1)
Sodium: 136 mmol/L (ref 135–145)

## 2022-06-18 LAB — GLUCOSE, CAPILLARY
Glucose-Capillary: 107 mg/dL — ABNORMAL HIGH (ref 70–99)
Glucose-Capillary: 115 mg/dL — ABNORMAL HIGH (ref 70–99)
Glucose-Capillary: 103 mg/dL — ABNORMAL HIGH (ref 70–99)
Glucose-Capillary: 110 mg/dL — ABNORMAL HIGH (ref 70–99)

## 2022-06-18 LAB — CBC
HCT: 26.7 % — ABNORMAL LOW (ref 36.0–46.0)
Hemoglobin: 8.6 g/dL — ABNORMAL LOW (ref 12.0–15.0)
MCH: 27.7 pg (ref 26.0–34.0)
MCHC: 32.2 g/dL (ref 30.0–36.0)
MCV: 86.1 fL (ref 80.0–100.0)
Platelets: 177 10*3/uL (ref 150–400)
RBC: 3.1 MIL/uL — ABNORMAL LOW (ref 3.87–5.11)
RDW: 14.7 % (ref 11.5–15.5)
WBC: 8.6 10*3/uL (ref 4.0–10.5)
nRBC: 0 % (ref 0.0–0.2)

## 2022-06-18 LAB — MAGNESIUM: Magnesium: 2 mg/dL (ref 1.7–2.4)

## 2022-06-18 MED ORDER — SODIUM BICARBONATE 650 MG PO TABS
650.0000 mg | ORAL_TABLET | Freq: Two times a day (BID) | ORAL | Status: DC
  Administered 2022-06-18 – 2022-06-19 (×4): 650 mg via ORAL
  Filled 2022-06-18 (×5): qty 1

## 2022-06-18 MED ORDER — FUROSEMIDE 10 MG/ML IJ SOLN
80.0000 mg | Freq: Once | INTRAMUSCULAR | Status: AC
  Administered 2022-06-18: 80 mg via INTRAVENOUS
  Filled 2022-06-18: qty 8

## 2022-06-18 NOTE — Progress Notes (Signed)
Pharmacy Electrolyte Monitoring Consult:  Pharmacy consulted to assist in monitoring and replacing electrolytes in this 81 y.o. female admitted on 06/10/2022 with Shortness of Breath Covid+  Labs:  Sodium (mmol/L)  Date Value  06/18/2022 136  08/11/2014 141  06/20/2012 142   Potassium (mmol/L)  Date Value  06/18/2022 4.2  06/20/2012 3.2 (L)   Magnesium (mg/dL)  Date Value  06/18/2022 2.0   Phosphorus (mg/dL)  Date Value  06/15/2022 6.8 (H)   Calcium (mg/dL)  Date Value  06/18/2022 8.4 (L)   Calcium, Total (mg/dL)  Date Value  06/20/2012 9.4   Albumin (g/dL)  Date Value  06/14/2022 3.2 (L)  06/20/2012 3.8   Medications: calcium carbonate 1257m PO TID, started on calcitriol 0.578m QD 9/11 -Holding losartan and torsemide, and spironolactone.  - lasix x 2 IV 9/16.     Assessment/Plan: No replacement needed at this time.  BMP with AM labs   KiOswald HillockPharmD 06/18/2022 7:11 AM

## 2022-06-18 NOTE — Progress Notes (Addendum)
PROGRESS NOTE    [name]  FFM:[id number] DOB: 1941/01/12 DOA: 06/10/2022 PCP: Kirk Ruths, MD   Brief Narrative:  81 y.o. Caucasian female with medical history significant for asthma, CHF, type diabetes mellitus, GERD, hypertension, dyslipidemia, CKD stage IV, hypothyroidism and OSA, who presented to the ER with acute onset of generalized weakness with associated nausea, vomiting and diarrhea. She has been having associated chest congestion and was recently diagnosed with COVID-19 on Monday.  She was started on p.o. molnupiravir.  Upon admission patient was noted to have worsening renal function concerns for ATN, nephrology team was consulted.  She had multiple electrolyte derangements requiring repletion.   Assessment & Plan:  Principal Problem:   Pneumonia due to COVID-19 virus Active Problems:   Hypocalcemia   Right shoulder pain   Essential hypertension   CKD (chronic kidney disease), stage IV (HCC)   Dyslipidemia   Depression   AKI (acute kidney injury) ([place])   Hypomagnesemia   Hyperphosphatemia     Assessment and Plan: * Pneumonia due to COVID-19 virus Abnormal Breath Sounds. Persist Concerns of superimposed aspiration pneumonia due to nausea and vomiting.  Currently on Molnupiravir continue and finish the course.  Completed 5-day course of Unasyn and doxycycline 9/14.  Bronchodilators, IS/Flutter. CXR yesterday shows signs of vol overload.  Will give another dose of lasix.  She needs to be out of bed to chair BID.  Echo ordered.  AKI on CKD (chronic kidney disease), stage IV (HCC) - Cr Continues to climb up; concerns of HD and may need HD. Nephro is following. Received a few doses of Lasix 7m IV, Cr has improved. Will give another dose of lasix. Has elevated BUN but no clear signs of uremia.  Start Na Bicarb tabs.    DM2 ISS and accuchecks.   Hypocalcemia -Prn repletion. Not on home meds.   Right shoulder pain - X-ray showing calcified tendinitis.   Pain control  Depression - On Zoloft  Dyslipidemia - Statin on hold while on Paxlovid   Essential hypertension - We will continue her antihypertensives.  Hypomagnesemia - Repletion  DVT prophylaxis: SQ Heparin Code Status: Full Family Communication:   Status is: Inpatient Creatinine Improving, still quite SOB. nephro following.  Ongoing management  Subjective: Sitting up in the chair. Still quite sob with minimal exertion.   Examination: Constitutional: Not in acute distress; 3L [place] Respiratory: diminished BS at the bases.  Cardiovascular: Normal sinus rhythm, no rubs Abdomen: Nontender nondistended good bowel sounds Musculoskeletal: No edema noted Skin: No rashes seen Neurologic: CN 2-12 grossly intact.  And nonfocal Psychiatric: Normal judgment and insight. Alert and oriented x 3. Normal mood.    Objective: Vitals:   06/17/22 1500 06/17/22 2006 06/18/22 0553 06/18/22 0824  BP: (!) 140/45 (!) 155/59 (!) 148/60 (!) 166/59  Pulse: 62 67 74 63  Resp: 16 18 (!) 22 19  Temp: 98.2 F (36.8 C) (!) 97.4 F (36.3 C) 98.3 F (36.8 C) 97.7 F (36.5 C)  TempSrc:   Oral Oral  SpO2: 95% 95% 96% 97%  Weight:      Height:        Intake/Output Summary (Last 24 hours) at 06/18/2022 0916 Last data filed at 06/17/2022 1700 Gross per 24 hour  Intake 240 ml  Output 700 ml  Net -460 ml   Filed Weights   06/10/22 0141  Weight: 88.5 kg     Data Reviewed:   CBC: Recent Labs  Lab 06/12/22 0913 06/13/22 0619 06/14/22 0506  06/15/22 0658 06/16/22 0734 06/17/22 0603 06/18/22 0451  WBC 9.9 8.4 8.4 9.5 9.4 9.1 8.6  NEUTROABS 6.4 5.2 5.0  --   --   --   --   HGB 10.4* 9.5* 9.5* 9.1* 8.9* 8.8* 8.6*  HCT 31.9* 29.1* 29.8* 28.3* 27.5* 27.7* 26.7*  MCV 87.2 88.4 88.7 87.6 86.5 87.7 86.1  PLT 181 142* 160 160 161 160 315   Basic Metabolic Panel: Recent Labs  Lab 06/12/22 1707 06/13/22 0619 06/13/22 1812 06/14/22 0506 06/15/22 0658 06/16/22 0734 06/17/22 0603  06/18/22 0451  NA 142 140 137 138 134* 133* 134* 136  K 4.1 4.1 4.5 4.6 4.8 4.8 4.7 4.2  CL 116* 113* 109 112* 107 107 107 106  CO2 14* 14* 16* 15* 15* 13* 13* 15*  GLUCOSE 93 100* 104* 100* 99 92 99 100*  BUN 55* 61* 63* 66* 73* 77* 85* 92*  CREATININE 4.01* 4.49* 4.80* 4.54* 4.81* 4.86* 4.35* 3.58*  CALCIUM 5.9* 6.2* 6.6* 6.9* 7.5* 7.6* 8.2* 8.4*  MG 1.0* 1.3* 1.1* 2.2 1.9 1.6* 2.0 2.0  PHOS 5.8* 5.4* 5.8* 6.1* 6.8*  --   --   --    GFR: Estimated Creatinine Clearance: 12.7 mL/min (A) (by C-G formula based on SCr of 3.58 mg/dL (H)). Liver Function Tests: Recent Labs  Lab 06/12/22 0913 06/13/22 0619 06/13/22 1812 06/14/22 0506  AST 30 24  --  22  ALT 18 15  --  13  ALKPHOS 47 46  --  50  BILITOT 0.6 0.4  --  0.7  PROT 7.3 6.8  --  6.6  ALBUMIN 3.9 3.5 3.1* 3.2*   No results for input(s): "LIPASE", "AMYLASE" in the last 168 hours. No results for input(s): "AMMONIA" in the last 168 hours. Coagulation Profile: No results for input(s): "INR", "PROTIME" in the last 168 hours. Cardiac Enzymes: No results for input(s): "CKTOTAL", "CKMB", "CKMBINDEX", "TROPONINI" in the last 168 hours. BNP (last 3 results) No results for input(s): "PROBNP" in the last 8760 hours. HbA1C: No results for input(s): "HGBA1C" in the last 72 hours.  CBG: Recent Labs  Lab 06/17/22 0759 06/17/22 1228 06/17/22 1502 06/17/22 2042 06/18/22 0758  GLUCAP 95 125* 113* 118* 107*   Lipid Profile: No results for input(s): "CHOL", "HDL", "LDLCALC", "TRIG", "CHOLHDL", "LDLDIRECT" in the last 72 hours. Thyroid Function Tests: No results for input(s): "TSH", "T4TOTAL", "FREET4", "T3FREE", "THYROIDAB" in the last 72 hours. Anemia Panel: No results for input(s): "VITAMINB12", "FOLATE", "FERRITIN", "TIBC", "IRON", "RETICCTPCT" in the last 72 hours.  Sepsis Labs: No results for input(s): "PROCALCITON", "LATICACIDVEN" in the last 168 hours.   Recent Results (from the past 240 hour(s))  Resp Panel by  RT-PCR (Flu A&B, Covid) Anterior Nasal Swab     Status: Abnormal   Collection Time: 06/08/22  2:37 PM   Specimen: Anterior Nasal Swab  Result Value Ref Range Status   SARS Coronavirus 2 by RT PCR POSITIVE (A) NEGATIVE Final    Comment: (NOTE) SARS-CoV-2 target nucleic acids are DETECTED.  The SARS-CoV-2 RNA is generally detectable in upper respiratory specimens during the acute phase of infection. Positive results are indicative of the presence of the identified virus, but do not rule out bacterial infection or co-infection with other pathogens not detected by the test. Clinical correlation with patient history and other diagnostic information is necessary to determine patient infection status. The expected result is Negative.  Fact Sheet for Patients: EntrepreneurPulse.com.au  Fact Sheet for Healthcare Providers: IncredibleEmployment.be  This test  is not yet approved or cleared by the [place] and  has been authorized for detection and/or diagnosis of SARS-CoV-2 by FDA under an Emergency Use Authorization (EUA).  This EUA will remain in effect (meaning this test can be used) for the duration of  the COVID-19 declaration under Section 564(b)(1) of the A ct, 21 U.S.C. section 360bbb-3(b)(1), unless the authorization is terminated or revoked sooner.     Influenza A by PCR NEGATIVE NEGATIVE Final   Influenza B by PCR NEGATIVE NEGATIVE Final    Comment: (NOTE) The Xpert Xpress SARS-CoV-2/FLU/RSV plus assay is intended as an aid in the diagnosis of influenza from Nasopharyngeal swab specimens and should not be used as a sole basis for treatment. Nasal washings and aspirates are unacceptable for Xpert Xpress SARS-CoV-2/FLU/RSV testing.  Fact Sheet for Patients: EntrepreneurPulse.com.au  Fact Sheet for Healthcare Providers: IncredibleEmployment.be  This test is not yet approved or cleared by the  [place] FDA and has been authorized for detection and/or diagnosis of SARS-CoV-2 by FDA under an Emergency Use Authorization (EUA). This EUA will remain in effect (meaning this test can be used) for the duration of the COVID-19 declaration under Section 564(b)(1) of the Act, 21 U.S.C. section 360bbb-3(b)(1), unless the authorization is terminated or revoked.  Performed at [facility], [address] [place], [place] [address]   Culture, blood (routine x 2)     Status: None   Collection Time: 06/10/22  2:04 AM   Specimen: BLOOD RIGHT ARM  Result Value Ref Range Status   Specimen Description BLOOD RIGHT ARM  Final   Special Requests   Final    BOTTLES DRAWN AEROBIC AND ANAEROBIC Blood Culture adequate volume   Culture   Final    NO GROWTH 5 DAYS Performed at [facility], [address] [place], [place] [address]    Report Status 06/15/2022 FINAL  Final  Culture, blood (routine x 2)     Status: None   Collection Time: 06/10/22  2:04 AM   Specimen: BLOOD RIGHT ARM  Result Value Ref Range Status   Specimen Description BLOOD RIGHT ARM  Final   Special Requests   Final    BOTTLES DRAWN AEROBIC AND ANAEROBIC Blood Culture results may not be optimal due to an inadequate volume of blood received in culture bottles   Culture   Final    NO GROWTH 5 DAYS Performed at [facility], [address] [place], [place] [address]    Report Status 06/15/2022 FINAL  Final  Urine Culture     Status: None   Collection Time: 06/13/22  5:47 AM   Specimen: Urine, Clean Catch  Result Value Ref Range Status   Specimen Description   Final    URINE, CLEAN CATCH Performed at [facility], [address], [place], [place] [address]    Special Requests   Final    NONE Performed at [facility], [address], [place], [place] [address]    Culture   Final    NO GROWTH Performed at [facility], [place] [address] [place],   [address]    Report Status 06/14/2022 FINAL  Final         Radiology Studies: DG Chest Port 1 View  Result Date: 06/17/2022 CLINICAL DATA:  Generalized weakness with associated nausea, vomiting and diarrhea. Recent diagnosis of COVID-19. EXAM: PORTABLE CHEST 1 VIEW COMPARISON:  06/10/2022. FINDINGS: Cardiac silhouette mildly enlarged.  No mediastinal or hilar  masses. Bilateral vascular congestion and interstitial prominence. Linear type opacities noted at the lung bases consistent with atelectasis. Additional opacity at both lung bases, left greater than right, consistent with small effusions. Remainder of the lungs is clear.  No pneumothorax. Skeletal structures are grossly intact. IMPRESSION: 1. Cardiomegaly, vascular congestion and interstitial prominence associated with small bilateral effusions suggest mild congestive heart failure. Electronically Signed   By: Lajean Manes M.D.   On: 06/17/2022 11:23        Scheduled Meds:  amLODipine  5 mg Oral Daily   vitamin C  500 mg Oral Daily   azelastine  1 spray Each Nare BID   calcitRIOL  0.5 mcg Oral Daily   calcium carbonate  1 tablet Oral TID WC   diclofenac Sodium  2 g Topical QID   heparin  5,000 Units Subcutaneous Q8H   hydrALAZINE  100 mg Oral BID   insulin aspart  0-9 Units Subcutaneous TID WC   Ipratropium-Albuterol  1 puff Inhalation Q6H   levothyroxine  150 mcg Oral Q0600   lidocaine  1 patch Transdermal Q12H   loratadine  10 mg Oral Daily   metoprolol tartrate  50 mg Oral BID   montelukast  10 mg Oral QHS   multivitamin with minerals  1 tablet Oral Daily   pantoprazole  40 mg Oral QAC breakfast   scopolamine  1 patch Transdermal Q72H   sertraline  100 mg Oral Daily   zinc sulfate  220 mg Oral Daily   Continuous Infusions:  promethazine (PHENERGAN) injection (IM or IVPB) Stopped (06/11/22 1113)     LOS: 8 days   Time spent= 35 mins    Vail Basista Arsenio Loader, MD [facility]  If 7PM-7AM, please contact  night-coverage  06/18/2022, 9:16 AM

## 2022-06-18 NOTE — Progress Notes (Addendum)
[facility]  ROUNDING NOTE   Subjective:   [name] is a 81 year old female with medical concerns including GERD, diabetes, hypertension, dyslipidemia, hypothyroidism, and chronic kidney disease stage IV.  Patient presents to the emergency department complaining of shortness of breath and has been admitted for Hypocalcemia [E83.51] Generalized weakness [R53.1] Nausea vomiting and diarrhea [R11.2, R19.7] Pneumonia due to COVID-19 virus [U07.1, J12.82] COVID-19 [U07.1]  Patient is known to our practice and is followed outpatient by Dr. Holley Raring.    Patient seen sitting on bedside commode, alert and oriented States she still feels pain today Complains of shortness of breath with exertion Lower extremity edema improved Patient remains on 3 L nasal cannula  Creatinine 3.58 Urine output 700 mL recorded on day shift yesterday.  Objective:  Vital signs in last 24 hours:  Temp:  [97.4 F (36.3 C)-98.3 F (36.8 C)] 97.7 F (36.5 C) (09/17 0824) Pulse Rate:  [62-74] 63 (09/17 0824) Resp:  [16-22] 19 (09/17 0824) BP: (140-166)/(45-60) 166/59 (09/17 0824) SpO2:  [95 %-97 %] 97 % (09/17 0824)  Weight change:  Filed Weights   06/10/22 0141  Weight: 88.5 kg    Intake/Output: I/O last 3 completed shifts: In: 360 [P.O.:360] Out: 700 [Urine:700]   Intake/Output this shift:  No intake/output data recorded.  Physical Exam: General: NAD  Head: Normocephalic, atraumatic. Moist oral mucosal membranes  Eyes: Anicteric  Lungs:  Mild diffuse wheezing, right lower crackles.  Tachypnea  Heart: Regular rate and rhythm  Abdomen:  Soft, nontender, obese  Extremities: + peripheral edema.  Neurologic: Nonfocal, moving all four extremities  Skin: No lesions, dry lower extremities  Access: Lt AVF (maturing)    Basic Metabolic Panel: Recent Labs  Lab 06/12/22 1707 06/13/22 3235 06/13/22 1812 06/14/22 0506 06/15/22 0658 06/16/22 0734 06/17/22 0603 06/18/22 0451  NA  142 140 137 138 134* 133* 134* 136  K 4.1 4.1 4.5 4.6 4.8 4.8 4.7 4.2  CL 116* 113* 109 112* 107 107 107 106  CO2 14* 14* 16* 15* 15* 13* 13* 15*  GLUCOSE 93 100* 104* 100* 99 92 99 100*  BUN 55* 61* 63* 66* 73* 77* 85* 92*  CREATININE 4.01* 4.49* 4.80* 4.54* 4.81* 4.86* 4.35* 3.58*  CALCIUM 5.9* 6.2* 6.6* 6.9* 7.5* 7.6* 8.2* 8.4*  MG 1.0* 1.3* 1.1* 2.2 1.9 1.6* 2.0 2.0  PHOS 5.8* 5.4* 5.8* 6.1* 6.8*  --   --   --      Liver Function Tests: Recent Labs  Lab 06/12/22 0913 06/13/22 0619 06/13/22 1812 06/14/22 0506  AST 30 24  --  22  ALT 18 15  --  13  ALKPHOS 47 46  --  50  BILITOT 0.6 0.4  --  0.7  PROT 7.3 6.8  --  6.6  ALBUMIN 3.9 3.5 3.1* 3.2*    No results for input(s): "LIPASE", "AMYLASE" in the last 168 hours. No results for input(s): "AMMONIA" in the last 168 hours.  CBC: Recent Labs  Lab 06/12/22 0913 06/13/22 0619 06/14/22 0506 06/15/22 0658 06/16/22 0734 06/17/22 0603 06/18/22 0451  WBC 9.9 8.4 8.4 9.5 9.4 9.1 8.6  NEUTROABS 6.4 5.2 5.0  --   --   --   --   HGB 10.4* 9.5* 9.5* 9.1* 8.9* 8.8* 8.6*  HCT 31.9* 29.1* 29.8* 28.3* 27.5* 27.7* 26.7*  MCV 87.2 88.4 88.7 87.6 86.5 87.7 86.1  PLT 181 142* 160 160 161 160 177     Cardiac Enzymes: No results for input(s): "  CKTOTAL", "CKMB", "CKMBINDEX", "TROPONINI" in the last 168 hours.  BNP: Invalid input(s): "POCBNP"  CBG: Recent Labs  Lab 06/17/22 1228 06/17/22 1502 06/17/22 2042 06/18/22 0758 06/18/22 1135  GLUCAP 125* 113* 118* 107* 115*     Microbiology: Results for orders placed or performed during the hospital encounter of 06/10/22  Culture, blood (routine x 2)     Status: None   Collection Time: 06/10/22  2:04 AM   Specimen: BLOOD RIGHT ARM  Result Value Ref Range Status   Specimen Description BLOOD RIGHT ARM  Final   Special Requests   Final    BOTTLES DRAWN AEROBIC AND ANAEROBIC Blood Culture adequate volume   Culture   Final    NO GROWTH 5 DAYS Performed at [facility], [address] [place], [place] [address]    Report Status 06/15/2022 FINAL  Final  Culture, blood (routine x 2)     Status: None   Collection Time: 06/10/22  2:04 AM   Specimen: BLOOD RIGHT ARM  Result Value Ref Range Status   Specimen Description BLOOD RIGHT ARM  Final   Special Requests   Final    BOTTLES DRAWN AEROBIC AND ANAEROBIC Blood Culture results may not be optimal due to an inadequate volume of blood received in culture bottles   Culture   Final    NO GROWTH 5 DAYS Performed at [facility], [address], [place], [place] [address]    Report Status 06/15/2022 FINAL  Final  Urine Culture     Status: None   Collection Time: 06/13/22  5:47 AM   Specimen: Urine, Clean Catch  Result Value Ref Range Status   Specimen Description   Final    URINE, CLEAN CATCH Performed at [facility], [address] [place], [place] [address]    Special Requests   Final    NONE Performed at [facility], [address], [place], [place] [address]    Culture   Final    NO GROWTH Performed at [facility], [place] [address] [place], [place] [address]    Report Status 06/14/2022 FINAL  Final    Coagulation Studies: No results for input(s): "LABPROT", "INR" in the last 72 hours.  Urinalysis: No results for input(s): "COLORURINE", "LABSPEC", "PHURINE", "GLUCOSEU", "HGBUR", "BILIRUBINUR", "KETONESUR", "PROTEINUR", "UROBILINOGEN", "NITRITE", "LEUKOCYTESUR" in the last 72 hours.  Invalid input(s): "APPERANCEUR"     Imaging: DG Chest Port 1 View  Result Date: 06/17/2022 CLINICAL DATA:  Generalized weakness with associated nausea, vomiting and diarrhea. Recent diagnosis of COVID-19. EXAM: PORTABLE CHEST 1 VIEW COMPARISON:  06/10/2022. FINDINGS: Cardiac silhouette mildly enlarged.  No mediastinal or hilar masses. Bilateral vascular congestion and interstitial prominence. Linear type opacities noted at the lung bases consistent with atelectasis.  Additional opacity at both lung bases, left greater than right, consistent with small effusions. Remainder of the lungs is clear.  No pneumothorax. Skeletal structures are grossly intact. IMPRESSION: 1. Cardiomegaly, vascular congestion and interstitial prominence associated with small bilateral effusions suggest mild congestive heart failure. Electronically Signed   By: Lajean Manes M.D.   On: 06/17/2022 11:23     Medications:    promethazine (PHENERGAN) injection (IM or IVPB) Stopped (06/11/22 1113)    amLODipine  5 mg Oral Daily   vitamin C  500 mg Oral Daily   azelastine  1 spray Each Nare BID   calcitRIOL  0.5 mcg Oral Daily   calcium carbonate  1 tablet Oral TID WC  diclofenac Sodium  2 g Topical QID   heparin  5,000 Units Subcutaneous Q8H   hydrALAZINE  100 mg Oral BID   insulin aspart  0-9 Units Subcutaneous TID WC   Ipratropium-Albuterol  1 puff Inhalation Q6H   levothyroxine  150 mcg Oral Q0600   lidocaine  1 patch Transdermal Q12H   loratadine  10 mg Oral Daily   metoprolol tartrate  50 mg Oral BID   montelukast  10 mg Oral QHS   multivitamin with minerals  1 tablet Oral Daily   pantoprazole  40 mg Oral QAC breakfast   scopolamine  1 patch Transdermal Q72H   sertraline  100 mg Oral Daily   sodium bicarbonate  650 mg Oral BID   zinc sulfate  220 mg Oral Daily   acetaminophen, albuterol, chlorpheniramine-HYDROcodone, guaiFENesin-dextromethorphan, hydrALAZINE, HYDROcodone-acetaminophen, loperamide, metoprolol tartrate, polyethylene glycol, promethazine (PHENERGAN) injection (IM or IVPB), senna-docusate, traZODone  Assessment/ Plan:  [name] is a 81 y.o.  female with medical concerns including GERD, diabetes, hypertension, dyslipidemia, hypothyroidism, and chronic kidney disease stage IV.  Patient presents to the emergency department complaining of shortness of breath and has been admitted for Hypocalcemia [E83.51] Generalized weakness [R53.1] Nausea vomiting and  diarrhea [R11.2, R19.7] Pneumonia due to COVID-19 virus [U07.1, J12.82] COVID-19 [U07.1]   Acute Kidney Injury on chronic kidney disease stage 4 with baseline creatinine 2.7 and GFR of 17 on 05/09/2022.  Acute kidney injury secondary to hypovolemia and severe ATN secondary to concurrent infection. Chronic kidney disease is secondary to advanced age, hypertension and diabetes CT abdomen pelvis shows multiple renal cysts in right kidney, left kidney within normal limits with no obstruction. Serum creatinine remains critically elevated.  Urine output low.  Discussed with patient that if the trend continues, she may need hemodialysis.  She has tentatively agreed to it if necessary. Renal function continues to improve.  Adequate urine output recorded.  Due to respiratory status, patient will receive 1 dose of IV furosemide.  We will continue to monitor renal function.    Lab Results  Component Value Date   CREATININE 3.58 (H) 06/18/2022   CREATININE 4.35 (H) 06/17/2022   CREATININE 4.86 (H) 06/16/2022    Intake/Output Summary (Last 24 hours) at 06/18/2022 1305 Last data filed at 06/17/2022 1700 Gross per 24 hour  Intake 240 ml  Output 350 ml  Net -110 ml     2. Hypocalcemia, severe.  Calcium 5.7 on admission.   Calcium continues to improve, 8.4.  Receiving calcitriol daily with calcium carbonate 3 times a day.  3. Anemia of chronic kidney disease Lab Results  Component Value Date   HGB 8.6 (L) 06/18/2022    We will continue to monitor.  4. Hypertension with chronic kidney disease. Home regimen includes amlodipine, hydralazine, losartan, metoprolol, spironolactone, and torsemide.  Scheduled doses of diuretics remaining held.  Patient receiving IV furosemide as needed.  5. Diabetes mellitus type II with chronic kidney disease/renal manifestations: noninsulin dependent.Most recent hemoglobin A1c is 6.0% on June 14, 2022.  Glucose well controlled  6.  COVID-19 pneumonia.  Patient  completed ampicillin-sulbactam and doxycycline, per primary team.  Continue supportive care.  We will see furosemide 80 mg IV once for respiratory support.   LOS: 8 Lenis Nettleton 9/17/20231:05 PM

## 2022-06-18 NOTE — Plan of Care (Signed)
  Problem: Education: Goal: Knowledge of risk factors and measures for prevention of condition will improve Outcome: Progressing   Problem: Coping: Goal: Psychosocial and spiritual needs will be supported Outcome: Progressing   Problem: Respiratory: Goal: Will maintain a patent airway Outcome: Progressing Goal: Complications related to the disease process, condition or treatment will be avoided or minimized Outcome: Progressing   Problem: Education: Goal: Knowledge of General Education information will improve Description: Including pain rating scale, medication(s)/side effects and non-pharmacologic comfort measures Outcome: Progressing   Problem: Health Behavior/Discharge Planning: Goal: Ability to manage health-related needs will improve Outcome: Progressing   Problem: Clinical Measurements: Goal: Ability to maintain clinical measurements within normal limits will improve Outcome: Progressing Goal: Will remain free from infection Outcome: Progressing Goal: Diagnostic test results will improve Outcome: Progressing Goal: Respiratory complications will improve Outcome: Progressing Goal: Cardiovascular complication will be avoided Outcome: Progressing   Problem: Activity: Goal: Risk for activity intolerance will decrease Outcome: Progressing   Problem: Nutrition: Goal: Adequate nutrition will be maintained Outcome: Progressing   Problem: Coping: Goal: Level of anxiety will decrease Outcome: Progressing   Problem: Elimination: Goal: Will not experience complications related to bowel motility Outcome: Progressing Goal: Will not experience complications related to urinary retention Outcome: Progressing   Problem: Pain Managment: Goal: General experience of comfort will improve Outcome: Progressing   Problem: Safety: Goal: Ability to remain free from injury will improve Outcome: Progressing   Problem: Skin Integrity: Goal: Risk for impaired skin integrity will  decrease Outcome: Progressing   Problem: Education: Goal: Ability to describe self-care measures that may prevent or decrease complications (Diabetes Survival Skills Education) will improve Outcome: Progressing Goal: Individualized Educational Video(s) Outcome: Progressing   Problem: Coping: Goal: Ability to adjust to condition or change in health will improve Outcome: Progressing   Problem: Fluid Volume: Goal: Ability to maintain a balanced intake and output will improve Outcome: Progressing   Problem: Health Behavior/Discharge Planning: Goal: Ability to identify and utilize available resources and services will improve Outcome: Progressing Goal: Ability to manage health-related needs will improve Outcome: Progressing   Problem: Metabolic: Goal: Ability to maintain appropriate glucose levels will improve Outcome: Progressing   Problem: Nutritional: Goal: Maintenance of adequate nutrition will improve Outcome: Progressing Goal: Progress toward achieving an optimal weight will improve Outcome: Progressing   Problem: Skin Integrity: Goal: Risk for impaired skin integrity will decrease Outcome: Progressing   Problem: Tissue Perfusion: Goal: Adequacy of tissue perfusion will improve Outcome: Progressing   

## 2022-06-19 ENCOUNTER — Inpatient Hospital Stay: Admit: 2022-06-19 | Payer: Medicare Other

## 2022-06-19 ENCOUNTER — Inpatient Hospital Stay
Admit: 2022-06-19 | Discharge: 2022-06-19 | Disposition: A | Discharge status: Home / Self Care | Payer: Medicare Other | Attending: Internal Medicine | Admitting: Internal Medicine

## 2022-06-19 DIAGNOSIS — J1282 Pneumonia due to coronavirus disease 2019: Secondary | ICD-10-CM | POA: Diagnosis not present

## 2022-06-19 DIAGNOSIS — U071 COVID-19: Secondary | ICD-10-CM | POA: Diagnosis not present

## 2022-06-19 LAB — BASIC METABOLIC PANEL
Anion gap: 11 (ref 5–15)
BUN: 91 mg/dL — ABNORMAL HIGH (ref 8–23)
CO2: 21 mmol/L — ABNORMAL LOW (ref 22–32)
Calcium: 8.9 mg/dL (ref 8.9–10.3)
Chloride: 106 mmol/L (ref 98–111)
Creatinine, Ser: 2.93 mg/dL — ABNORMAL HIGH (ref 0.44–1.00)
GFR, Estimated: 16 mL/min — ABNORMAL LOW (ref >60)
Glucose, Bld: 113 mg/dL — ABNORMAL HIGH (ref 70–99)
Potassium: 3.7 mmol/L (ref 3.5–5.1)
Sodium: 138 mmol/L (ref 135–145)

## 2022-06-19 LAB — CBC
HCT: 27.1 % — ABNORMAL LOW (ref 36.0–46.0)
Hemoglobin: 9 g/dL — ABNORMAL LOW (ref 12.0–15.0)
MCH: 28.6 pg (ref 26.0–34.0)
MCHC: 33.2 g/dL (ref 30.0–36.0)
MCV: 86 fL (ref 80.0–100.0)
Platelets: 180 10*3/uL (ref 150–400)
RBC: 3.15 MIL/uL — ABNORMAL LOW (ref 3.87–5.11)
RDW: 14.7 % (ref 11.5–15.5)
WBC: 8.3 10*3/uL (ref 4.0–10.5)
nRBC: 0 % (ref 0.0–0.2)

## 2022-06-19 LAB — GLUCOSE, CAPILLARY
Glucose-Capillary: 106 mg/dL — ABNORMAL HIGH (ref 70–99)
Glucose-Capillary: 97 mg/dL (ref 70–99)
Glucose-Capillary: 140 mg/dL — ABNORMAL HIGH (ref 70–99)
Glucose-Capillary: 115 mg/dL — ABNORMAL HIGH (ref 70–99)

## 2022-06-19 LAB — MAGNESIUM: Magnesium: 1.8 mg/dL (ref 1.7–2.4)

## 2022-06-19 MED ORDER — FUROSEMIDE 10 MG/ML IJ SOLN
80.0000 mg | Freq: Once | INTRAMUSCULAR | Status: AC
  Administered 2022-06-19: 80 mg via INTRAVENOUS
  Filled 2022-06-19: qty 8

## 2022-06-19 NOTE — Progress Notes (Signed)
*  PRELIMINARY RESULTS* Echocardiogram 2D Echocardiogram has been performed.  Sherrie Sport 06/19/2022, 2:55 PM

## 2022-06-19 NOTE — Progress Notes (Signed)
Physical Therapy Treatment Patient Details Name: [name] MRN: [id number] DOB: [birth year] Today's Date: 06/19/2022   History of Present Illness Patient is a 81 y.o. Caucasian female with medical history significant for asthma, CHF, type diabetes mellitus, GERD, hypertension, dyslipidemia, CKD stage IV, hypothyroidism and OSA, who presented to the ER with acute onset of generalized weakness with associated nausea, vomiting and diarrhea, recent diagnosis of COVID-19.    PT Comments    Pt resting in bed upon PT arrival; agreeable to PT session.  Able to progress to ambulating 90 feet with RW CGA (pt steady without any loss of balance and demonstrating good positioning within walker during ambulation).  No c/o pain.  O2 sats 93% or greater on 3 L O2 via nasal cannula during sessions activities.  Decreased activity tolerance and generalized fatigue/weakness noted.  Will continue to focus on strengthening, balance, and progressive functional mobility during hospitalization.    Recommendations for follow up therapy are one component of a multi-disciplinary discharge planning process, led by the attending physician.  Recommendations may be updated based on patient status, additional functional criteria and insurance authorization.  Follow Up Recommendations  Home health PT     Assistance Recommended at Discharge Set up Supervision/Assistance  Patient can return home with the following A little help with walking and/or transfers;A little help with bathing/dressing/bathroom;Assist for transportation;Help with stairs or ramp for entrance;Assistance with cooking/housework   Equipment Recommendations  Rolling walker (2 wheels)    Recommendations for Other Services       Precautions / Restrictions Precautions Precautions: Fall Restrictions Weight Bearing Restrictions: No     Mobility  Bed Mobility Overal bed mobility: Modified Independent Bed Mobility: Supine to Sit, Sit to Supine      Supine to sit: Modified independent (Device/Increase time), HOB elevated Sit to supine: Modified independent (Device/Increase time), HOB elevated   General bed mobility comments: mild increased time to perform on own    Transfers Overall transfer level: Needs assistance Equipment used: Rolling walker (2 wheels) Transfers: Sit to/from Stand, Bed to chair/wheelchair/BSC Sit to Stand: Min guard, Supervision   Step pivot transfers: Supervision, Min guard       General transfer comment: stand step turn bed to [facility] to recliner to bed with UE support as needed; steady    Ambulation/Gait Ambulation/Gait assistance: Min guard Gait Distance (Feet): 90 Feet Assistive device: Rolling walker (2 wheels) Gait Pattern/deviations: Step-through pattern Gait velocity: mildly decreased     General Gait Details: pt verbalizing need to stay closer to RW (and did well positioning within RW during session); steady with RW use   Stairs             Wheelchair Mobility    Modified Rankin (Stroke Patients Only)       Balance Overall balance assessment: Needs assistance Sitting-balance support: No upper extremity supported, Feet supported Sitting balance-Leahy Scale: Good Sitting balance - Comments: steady sitting reaching within BOS   Standing balance support: Bilateral upper extremity supported, During functional activity, Reliant on assistive device for balance Standing balance-Leahy Scale: Good Standing balance comment: steady ambulating with RW use                            Cognition Arousal/Alertness: Awake/alert Behavior During Therapy: WFL for tasks assessed/performed Overall Cognitive Status: Within Functional Limits for tasks assessed  Exercises      General Comments  Nursing cleared pt for participation in physical therapy.  Pt agreeable to PT session.      Pertinent Vitals/Pain Pain  Assessment Pain Assessment: No/denies pain HR WFL during sessions activities.    Home Living                          Prior Function            PT Goals (current goals can now be found in the care plan section) Acute Rehab PT Goals Patient Stated Goal: to return home PT Goal Formulation: With patient Time For Goal Achievement: 06/27/22 Potential to Achieve Goals: Good Progress towards PT goals: Progressing toward goals    Frequency    Min 2X/week      PT Plan Current plan remains appropriate    Co-evaluation              AM-PAC PT "6 Clicks" Mobility   Outcome Measure  Help needed turning from your back to your side while in a flat bed without using bedrails?: None Help needed moving from lying on your back to sitting on the side of a flat bed without using bedrails?: None Help needed moving to and from a bed to a chair (including a wheelchair)?: A Little Help needed standing up from a chair using your arms (e.g., wheelchair or bedside chair)?: A Little Help needed to walk in hospital room?: A Little Help needed climbing 3-5 steps with a railing? : A Little 6 Click Score: 20    End of Session Equipment Utilized During Treatment: Gait belt;Oxygen (3 L O2 via nasal cannula) Activity Tolerance: Patient limited by fatigue Patient left: in bed;with call bell/phone within reach;with bed alarm set Nurse Communication: Mobility status;Precautions PT Visit Diagnosis: Unsteadiness on feet (R26.81);Muscle weakness (generalized) (M62.81)     Time: 6811-5726 PT Time Calculation (min) (ACUTE ONLY): 23 min  Charges:  $Gait Training: 8-22 mins $Therapeutic Activity: 8-22 mins                     Leitha Bleak, PT 06/19/22, 4:38 PM

## 2022-06-19 NOTE — Progress Notes (Signed)
[facility]  ROUNDING NOTE   Subjective:   [name] is a 81 year old female with medical concerns including GERD, diabetes, hypertension, dyslipidemia, hypothyroidism, and chronic kidney disease stage IV.  Patient presents to the emergency department complaining of shortness of breath and has been admitted for Hypocalcemia [E83.51] Generalized weakness [R53.1] Nausea vomiting and diarrhea [R11.2, R19.7] Pneumonia due to COVID-19 virus [U07.1, J12.82] COVID-19 [U07.1]  Patient is known to our practice and is followed outpatient by Dr. Holley Raring.    Patient seen sitting up in bed Husband at bedside Respiratory status improved. Reports decreased shortness of breath with activity.   Creatinine 2.93   Objective:  Vital signs in last 24 hours:  Temp:  [97.5 F (36.4 C)-97.9 F (36.6 C)] 97.9 F (36.6 C) (09/18 0807) Pulse Rate:  [61-67] 63 (09/18 0807) Resp:  [17-22] 17 (09/18 0807) BP: (173-189)/(54-67) 184/67 (09/18 0807) SpO2:  [94 %-100 %] 97 % (09/18 0807)  Weight change:  Filed Weights   06/10/22 0141  Weight: 88.5 kg    Intake/Output: I/O last 3 completed shifts: In: -  Out: 400 [Urine:400]   Intake/Output this shift:  Total I/O In: -  Out: 400 [Urine:400]  Physical Exam: General: NAD  Head: Normocephalic, atraumatic. Moist oral mucosal membranes  Eyes: Anicteric  Lungs:  Diminished in bases  Tachypnea  Heart: Regular rate and rhythm  Abdomen:  Soft, nontender, obese  Extremities: + peripheral edema.  Neurologic: Nonfocal, moving all four extremities  Skin: No lesions, dry lower extremities  Access: Lt AVF (maturing)    Basic Metabolic Panel: Recent Labs  Lab 06/12/22 1707 06/13/22 8325 06/13/22 1812 06/14/22 0506 06/15/22 4982 06/16/22 0734 06/17/22 0603 06/18/22 0451 06/19/22 0543  NA 142 140 137 138 134* 133* 134* 136 138  K 4.1 4.1 4.5 4.6 4.8 4.8 4.7 4.2 3.7  CL 116* 113* 109 112* 107 107 107 106 106  CO2 14* 14* 16*  15* 15* 13* 13* 15* 21*  GLUCOSE 93 100* 104* 100* 99 92 99 100* 113*  BUN 55* 61* 63* 66* 73* 77* 85* 92* 91*  CREATININE 4.01* 4.49* 4.80* 4.54* 4.81* 4.86* 4.35* 3.58* 2.93*  CALCIUM 5.9* 6.2* 6.6* 6.9* 7.5* 7.6* 8.2* 8.4* 8.9  MG 1.0* 1.3* 1.1* 2.2 1.9 1.6* 2.0 2.0 1.8  PHOS 5.8* 5.4* 5.8* 6.1* 6.8*  --   --   --   --      Liver Function Tests: Recent Labs  Lab 06/13/22 0619 06/13/22 1812 06/14/22 0506  AST 24  --  22  ALT 15  --  13  ALKPHOS 46  --  50  BILITOT 0.4  --  0.7  PROT 6.8  --  6.6  ALBUMIN 3.5 3.1* 3.2*    No results for input(s): "LIPASE", "AMYLASE" in the last 168 hours. No results for input(s): "AMMONIA" in the last 168 hours.  CBC: Recent Labs  Lab 06/13/22 0619 06/14/22 0506 06/15/22 0658 06/16/22 0734 06/17/22 0603 06/18/22 0451 06/19/22 0543  WBC 8.4 8.4 9.5 9.4 9.1 8.6 8.3  NEUTROABS 5.2 5.0  --   --   --   --   --   HGB 9.5* 9.5* 9.1* 8.9* 8.8* 8.6* 9.0*  HCT 29.1* 29.8* 28.3* 27.5* 27.7* 26.7* 27.1*  MCV 88.4 88.7 87.6 86.5 87.7 86.1 86.0  PLT 142* 160 160 161 160 177 180     Cardiac Enzymes: No results for input(s): "CKTOTAL", "CKMB", "CKMBINDEX", "TROPONINI" in the last 168 hours.  BNP: Invalid  input(s): "POCBNP"  CBG: Recent Labs  Lab 06/18/22 1135 06/18/22 1709 06/18/22 2133 06/19/22 0809 06/19/22 1216  GLUCAP 115* 103* 110* 106* 97     Microbiology: Results for orders placed or performed during the hospital encounter of 06/10/22  Culture, blood (routine x 2)     Status: None   Collection Time: 06/10/22  2:04 AM   Specimen: BLOOD RIGHT ARM  Result Value Ref Range Status   Specimen Description BLOOD RIGHT ARM  Final   Special Requests   Final    BOTTLES DRAWN AEROBIC AND ANAEROBIC Blood Culture adequate volume   Culture   Final    NO GROWTH 5 DAYS Performed at [facility], [address] [place], [place] [address]    Report Status 06/15/2022 FINAL  Final  Culture, blood (routine x 2)     Status:  None   Collection Time: 06/10/22  2:04 AM   Specimen: BLOOD RIGHT ARM  Result Value Ref Range Status   Specimen Description BLOOD RIGHT ARM  Final   Special Requests   Final    BOTTLES DRAWN AEROBIC AND ANAEROBIC Blood Culture results may not be optimal due to an inadequate volume of blood received in culture bottles   Culture   Final    NO GROWTH 5 DAYS Performed at [facility], [address], [place], [place] [address]    Report Status 06/15/2022 FINAL  Final  Urine Culture     Status: None   Collection Time: 06/13/22  5:47 AM   Specimen: Urine, Clean Catch  Result Value Ref Range Status   Specimen Description   Final    URINE, CLEAN CATCH Performed at [facility], [address], [place], [place] [address]    Special Requests   Final    NONE Performed at [facility], [address] [place], [place] [address]    Culture   Final    NO GROWTH Performed at [facility], [place] [address] [place],  [address]    Report Status 06/14/2022 FINAL  Final    Coagulation Studies: No results for input(s): "LABPROT", "INR" in the last 72 hours.  Urinalysis: No results for input(s): "COLORURINE", "LABSPEC", "PHURINE", "GLUCOSEU", "HGBUR", "BILIRUBINUR", "KETONESUR", "PROTEINUR", "UROBILINOGEN", "NITRITE", "LEUKOCYTESUR" in the last 72 hours.  Invalid input(s): "APPERANCEUR"     Imaging: No results found.   Medications:    promethazine (PHENERGAN) injection (IM or IVPB) Stopped (06/11/22 1113)    amLODipine  5 mg Oral Daily   vitamin C  500 mg Oral Daily   azelastine  1 spray Each Nare BID   calcitRIOL  0.5 mcg Oral Daily   calcium carbonate  1 tablet Oral TID WC   diclofenac Sodium  2 g Topical QID   heparin  5,000 Units Subcutaneous Q8H   hydrALAZINE  100 mg Oral BID   insulin aspart  0-9 Units Subcutaneous TID WC   Ipratropium-Albuterol  1 puff Inhalation Q6H   levothyroxine  150 mcg Oral Q0600   lidocaine  1 patch  Transdermal Q12H   loratadine  10 mg Oral Daily   metoprolol tartrate  50 mg Oral BID   montelukast  10 mg Oral QHS   multivitamin with minerals  1 tablet Oral Daily   pantoprazole  40 mg Oral QAC breakfast   scopolamine  1 patch Transdermal Q72H   sertraline  100 mg Oral Daily   sodium bicarbonate  650 mg Oral BID   zinc sulfate  220 mg Oral Daily   acetaminophen, albuterol, chlorpheniramine-HYDROcodone, guaiFENesin-dextromethorphan, hydrALAZINE, HYDROcodone-acetaminophen, loperamide, metoprolol tartrate, polyethylene glycol, promethazine (PHENERGAN) injection (IM or IVPB), senna-docusate, traZODone  Assessment/ Plan:  [name] is a 81 y.o.  female with medical concerns including GERD, diabetes, hypertension, dyslipidemia, hypothyroidism, and chronic kidney disease stage IV.  Patient presents to the emergency department complaining of shortness of breath and has been admitted for Hypocalcemia [E83.51] Generalized weakness [R53.1] Nausea vomiting and diarrhea [R11.2, R19.7] Pneumonia due to COVID-19 virus [U07.1, J12.82] COVID-19 [U07.1]   Acute Kidney Injury on chronic kidney disease stage 4 with baseline creatinine 2.7 and GFR of 17 on 05/09/2022.  Acute kidney injury secondary to hypovolemia and severe ATN secondary to concurrent infection. Chronic kidney disease is secondary to advanced age, hypertension and diabetes CT abdomen pelvis shows multiple renal cysts in right kidney, left kidney within normal limits with no obstruction. Serum creatinine remains critically elevated.   Creatinine has improved with adequate urine output recorded. Will continue to monitor labs. No acute need for dialysis at this time.     Lab Results  Component Value Date   CREATININE 2.93 (H) 06/19/2022   CREATININE 3.58 (H) 06/18/2022   CREATININE 4.35 (H) 06/17/2022    Intake/Output Summary (Last 24 hours) at 06/19/2022 1354 Last data filed at 06/19/2022 1225 Gross per 24 hour  Intake --   Output 800 ml  Net -800 ml     2. Hypocalcemia, severe.  Calcium 5.7 on admission.   Calcium improving. Receiving calcitriol daily with calcium carbonate 3 times a day.  3. Anemia of chronic kidney disease Lab Results  Component Value Date   HGB 9.0 (L) 06/19/2022    Hgb at goal  4. Hypertension with chronic kidney disease. Home regimen includes amlodipine, hydralazine, losartan, metoprolol, spironolactone, and torsemide.  Scheduled doses of diuretics remaining held.    5. Diabetes mellitus type II with chronic kidney disease/renal manifestations: noninsulin dependent.Most recent hemoglobin A1c is 6.0% on June 14, 2022.  Glucose well controlled  6.  COVID-19 pneumonia.  Patient completed ampicillin-sulbactam and doxycycline, per primary team.  Continue supportive care. Remains on 3L [place].   LOS: [place] 9/18/20231:54 PM

## 2022-06-19 NOTE — Progress Notes (Signed)
PROGRESS NOTE    [name]  JYN:[id number] DOB: 1941-09-23 DOA: 06/10/2022 PCP: Kirk Ruths, MD   Brief Narrative:  81 y.o. Caucasian female with medical history significant for asthma, CHF, type diabetes mellitus, GERD, hypertension, dyslipidemia, CKD stage IV, hypothyroidism and OSA, who presented to the ER with acute onset of generalized weakness with associated nausea, vomiting and diarrhea. She has been having associated chest congestion and was recently diagnosed with COVID-19 on Monday.  She was started on p.o. molnupiravir.  Upon admission patient was noted to have worsening renal function concerns for ATN, nephrology team was consulted.  She had multiple electrolyte derangements requiring repletion.   Assessment & Plan:  Principal Problem:   Pneumonia due to COVID-19 virus Active Problems:   Hypocalcemia   Right shoulder pain   Essential hypertension   CKD (chronic kidney disease), stage IV (HCC)   Dyslipidemia   Depression   AKI (acute kidney injury) ([place])   Hypomagnesemia   Hyperphosphatemia     Assessment and Plan: * Pneumonia due to COVID-19 virus Abnormal Breath Sounds. Persist Concerns of superimposed aspiration pneumonia due to nausea and vomiting.  Currently on Molnupiravir continue and finish the course.  Completed 5-day course of Unasyn and doxycycline 9/14.  Bronchodilators, IS/Flutter.  Chest x-ray showed volume overload but doing better today.  We will give another dose of Lasix.  Echo ordered.  Read pending  AKI on CKD (chronic kidney disease), stage IV (HCC) - Cr Continues to climb up; concerns of HD and may need HD. Nephro is following. Received a few doses of Lasix 30m IV, creatinine continues to improve.  We will give another dose of Lasix, continue sodium bicarb.   DM2 ISS and accuchecks.   Hypocalcemia -Prn repletion. Not on home meds.   Right shoulder pain - X-ray showing calcified tendinitis.  Pain control  Depression - On  Zoloft  Dyslipidemia - Statin on hold while on Paxlovid   Essential hypertension - We will continue her antihypertensives.  Hypomagnesemia - Repletion  DVT prophylaxis: SQ Heparin Code Status: Full Family Communication:   Status is: Inpatient Creatinine Improving, still quite SOB. nephro following.  Ongoing management  Subjective: Tells me her shortness of breath is quite better and able to lay flat on the recliner today.  Still has exertional dyspnea.  Examination: Constitutional: Not in acute distress; 2-3 L nasal cannula Respiratory: Diminished breath sounds at the bases but improved Cardiovascular: Normal sinus rhythm, no rubs Abdomen: Nontender nondistended good bowel sounds Musculoskeletal: trace  edema noted Skin: No rashes seen Neurologic: CN 2-12 grossly intact.  And nonfocal Psychiatric: Normal judgment and insight. Alert and oriented x 3. Normal mood.    Objective: Vitals:   06/18/22 1616 06/18/22 2234 06/19/22 0138 06/19/22 0807  BP: (!) 173/54 (!) 189/57  (!) 184/67  Pulse: 61 67  63  Resp:  (!) 22  17  Temp: 97.7 F (36.5 C) (!) 97.5 F (36.4 C)  97.9 F (36.6 C)  TempSrc:  Axillary  Oral  SpO2: 94% 100% 98% 97%  Weight:      Height:        Intake/Output Summary (Last 24 hours) at 06/19/2022 1147 Last data filed at 06/18/2022 1730 Gross per 24 hour  Intake --  Output 400 ml  Net -400 ml   Filed Weights   06/10/22 0141  Weight: 88.5 kg     Data Reviewed:   CBC: Recent Labs  Lab 06/13/22 0619 06/14/22 0506 06/15/22 0865709/15/23 0734  06/17/22 0603 06/18/22 0451 06/19/22 0543  WBC 8.4 8.4 9.5 9.4 9.1 8.6 8.3  NEUTROABS 5.2 5.0  --   --   --   --   --   HGB 9.5* 9.5* 9.1* 8.9* 8.8* 8.6* 9.0*  HCT 29.1* 29.8* 28.3* 27.5* 27.7* 26.7* 27.1*  MCV 88.4 88.7 87.6 86.5 87.7 86.1 86.0  PLT 142* 160 160 161 160 177 614   Basic Metabolic Panel: Recent Labs  Lab 06/12/22 1707 06/13/22 0619 06/13/22 1812 06/14/22 0506 06/15/22 0658  06/16/22 0734 06/17/22 0603 06/18/22 0451 06/19/22 0543  NA 142 140 137 138 134* 133* 134* 136 138  K 4.1 4.1 4.5 4.6 4.8 4.8 4.7 4.2 3.7  CL 116* 113* 109 112* 107 107 107 106 106  CO2 14* 14* 16* 15* 15* 13* 13* 15* 21*  GLUCOSE 93 100* 104* 100* 99 92 99 100* 113*  BUN 55* 61* 63* 66* 73* 77* 85* 92* 91*  CREATININE 4.01* 4.49* 4.80* 4.54* 4.81* 4.86* 4.35* 3.58* 2.93*  CALCIUM 5.9* 6.2* 6.6* 6.9* 7.5* 7.6* 8.2* 8.4* 8.9  MG 1.0* 1.3* 1.1* 2.2 1.9 1.6* 2.0 2.0 1.8  PHOS 5.8* 5.4* 5.8* 6.1* 6.8*  --   --   --   --    GFR: Estimated Creatinine Clearance: 15.6 mL/min (A) (by C-G formula based on SCr of 2.93 mg/dL (H)). Liver Function Tests: Recent Labs  Lab 06/13/22 0619 06/13/22 1812 06/14/22 0506  AST 24  --  22  ALT 15  --  13  ALKPHOS 46  --  50  BILITOT 0.4  --  0.7  PROT 6.8  --  6.6  ALBUMIN 3.5 3.1* 3.2*   No results for input(s): "LIPASE", "AMYLASE" in the last 168 hours. No results for input(s): "AMMONIA" in the last 168 hours. Coagulation Profile: No results for input(s): "INR", "PROTIME" in the last 168 hours. Cardiac Enzymes: No results for input(s): "CKTOTAL", "CKMB", "CKMBINDEX", "TROPONINI" in the last 168 hours. BNP (last 3 results) No results for input(s): "PROBNP" in the last 8760 hours. HbA1C: No results for input(s): "HGBA1C" in the last 72 hours.  CBG: Recent Labs  Lab 06/18/22 0758 06/18/22 1135 06/18/22 1709 06/18/22 2133 06/19/22 0809  GLUCAP 107* 115* 103* 110* 106*   Lipid Profile: No results for input(s): "CHOL", "HDL", "LDLCALC", "TRIG", "CHOLHDL", "LDLDIRECT" in the last 72 hours. Thyroid Function Tests: No results for input(s): "TSH", "T4TOTAL", "FREET4", "T3FREE", "THYROIDAB" in the last 72 hours. Anemia Panel: No results for input(s): "VITAMINB12", "FOLATE", "FERRITIN", "TIBC", "IRON", "RETICCTPCT" in the last 72 hours.  Sepsis Labs: No results for input(s): "PROCALCITON", "LATICACIDVEN" in the last 168 hours.   Recent  Results (from the past 240 hour(s))  Culture, blood (routine x 2)     Status: None   Collection Time: 06/10/22  2:04 AM   Specimen: BLOOD RIGHT ARM  Result Value Ref Range Status   Specimen Description BLOOD RIGHT ARM  Final   Special Requests   Final    BOTTLES DRAWN AEROBIC AND ANAEROBIC Blood Culture adequate volume   Culture   Final    NO GROWTH 5 DAYS Performed at [facility], [address] [place], [place] [address]    Report Status 06/15/2022 FINAL  Final  Culture, blood (routine x 2)     Status: None   Collection Time: 06/10/22  2:04 AM   Specimen: BLOOD RIGHT ARM  Result Value Ref Range Status   Specimen Description BLOOD RIGHT ARM  Final  Special Requests   Final    BOTTLES DRAWN AEROBIC AND ANAEROBIC Blood Culture results may not be optimal due to an inadequate volume of blood received in culture bottles   Culture   Final    NO GROWTH 5 DAYS Performed at [facility], [address], [place], [place] [address]    Report Status 06/15/2022 FINAL  Final  Urine Culture     Status: None   Collection Time: 06/13/22  5:47 AM   Specimen: Urine, Clean Catch  Result Value Ref Range Status   Specimen Description   Final    URINE, CLEAN CATCH Performed at [facility], [address] [place], [place] [address]    Special Requests   Final    NONE Performed at [facility], [address], [place], [place] [address]    Culture   Final    NO GROWTH Performed at [facility], [place] [address], [place],  [address]    Report Status 06/14/2022 FINAL  Final         Radiology Studies: No results found.      Scheduled Meds:  amLODipine  5 mg Oral Daily   vitamin C  500 mg Oral Daily   azelastine  1 spray Each Nare BID   calcitRIOL  0.5 mcg Oral Daily   calcium carbonate  1 tablet Oral TID WC   diclofenac Sodium  2 g Topical QID   furosemide  80 mg Intravenous Once   heparin  5,000 Units Subcutaneous Q8H    hydrALAZINE  100 mg Oral BID   insulin aspart  0-9 Units Subcutaneous TID WC   Ipratropium-Albuterol  1 puff Inhalation Q6H   levothyroxine  150 mcg Oral Q0600   lidocaine  1 patch Transdermal Q12H   loratadine  10 mg Oral Daily   metoprolol tartrate  50 mg Oral BID   montelukast  10 mg Oral QHS   multivitamin with minerals  1 tablet Oral Daily   pantoprazole  40 mg Oral QAC breakfast   scopolamine  1 patch Transdermal Q72H   sertraline  100 mg Oral Daily   sodium bicarbonate  650 mg Oral BID   zinc sulfate  220 mg Oral Daily   Continuous Infusions:  promethazine (PHENERGAN) injection (IM or IVPB) Stopped (06/11/22 1113)     LOS: 9 days   Time spent= 35 mins    Allisa Einspahr Arsenio Loader, MD [facility]  If 7PM-7AM, please contact night-coverage  06/19/2022, 11:47 AM

## 2022-06-19 NOTE — Plan of Care (Signed)
  Problem: Respiratory: Goal: Will maintain a patent airway Outcome: Progressing Goal: Complications related to the disease process, condition or treatment will be avoided or minimized Outcome: Progressing   Problem: Respiratory: Goal: Complications related to the disease process, condition or treatment will be avoided or minimized Outcome: Progressing   Problem: Education: Goal: Knowledge of General Education information will improve Description: Including pain rating scale, medication(s)/side effects and non-pharmacologic comfort measures Outcome: Progressing   Problem: Health Behavior/Discharge Planning: Goal: Ability to manage health-related needs will improve Outcome: Progressing   Problem: Clinical Measurements: Goal: Respiratory complications will improve Outcome: Progressing Goal: Cardiovascular complication will be avoided Outcome: Progressing   Problem: Activity: Goal: Risk for activity intolerance will decrease Outcome: Progressing   Problem: Nutrition: Goal: Adequate nutrition will be maintained Outcome: Progressing   Problem: Coping: Goal: Level of anxiety will decrease Outcome: Progressing   Problem: Safety: Goal: Ability to remain free from injury will improve Outcome: Progressing   Problem: Skin Integrity: Goal: Risk for impaired skin integrity will decrease Outcome: Progressing

## 2022-06-19 NOTE — TOC Progression Note (Signed)
Transition of Care [facility]) - Progression Note    Patient Details  Name: MATTISON GOLAY MRN: [id number] Date of Birth: November 23, 1940  Transition of Care [facility]) CM/SW [place], RN Phone Number: 06/19/2022, 11:46 AM  Clinical Narrative:     TOC continues to follow the patient for DC planning and needs [facility] is set up for [facility] services Shower seat was delivered to the bedside to take home Remains on 3 Liters of O2  Expected Discharge Plan: [place] Barriers to Discharge: Continued Medical Work up  Expected Discharge Plan and Services Expected Discharge Plan: [place]   Discharge Planning Services: CM Consult   Living arrangements for the past 2 months: Single Family Home                 DME Arranged: Shower stool DME Agency: [employer] Date DME Agency Contacted: 06/13/22 Time DME Agency Contacted: [phone] Representative spoke with at DME Agency: [place]: PT [place]: [place] Date [place]: 06/13/22 Time [place]: 5 Representative spoke with at [place]: [place] Determinants of Health ([place]) Interventions    Readmission Risk Interventions     No data to display

## 2022-06-20 DIAGNOSIS — U071 COVID-19: Secondary | ICD-10-CM | POA: Diagnosis not present

## 2022-06-20 DIAGNOSIS — J1282 Pneumonia due to coronavirus disease 2019: Secondary | ICD-10-CM | POA: Diagnosis not present

## 2022-06-20 LAB — ECHOCARDIOGRAM COMPLETE
AR max vel: 2.44 cm2
AV Area VTI: 2.49 cm2
AV Area mean vel: 2.49 cm2
AV Mean grad: 6 mmHg
AV Peak grad: 10.5 mmHg
Ao pk vel: 1.62 m/s
Area-P 1/2: 4.29 cm2
Height: 62 in
S' Lateral: 3.4 cm
Weight: 3120 oz

## 2022-06-20 LAB — GLUCOSE, CAPILLARY
Glucose-Capillary: 109 mg/dL — ABNORMAL HIGH (ref 70–99)
Glucose-Capillary: 135 mg/dL — ABNORMAL HIGH (ref 70–99)
Glucose-Capillary: 101 mg/dL — ABNORMAL HIGH (ref 70–99)
Glucose-Capillary: 90 mg/dL (ref 70–99)

## 2022-06-20 LAB — BASIC METABOLIC PANEL
Anion gap: 10 (ref 5–15)
BUN: 81 mg/dL — ABNORMAL HIGH (ref 8–23)
CO2: 26 mmol/L (ref 22–32)
Calcium: 8.7 mg/dL — ABNORMAL LOW (ref 8.9–10.3)
Chloride: 104 mmol/L (ref 98–111)
Creatinine, Ser: 2.59 mg/dL — ABNORMAL HIGH (ref 0.44–1.00)
GFR, Estimated: 18 mL/min — ABNORMAL LOW (ref >60)
Glucose, Bld: 107 mg/dL — ABNORMAL HIGH (ref 70–99)
Potassium: 3.5 mmol/L (ref 3.5–5.1)
Sodium: 140 mmol/L (ref 135–145)

## 2022-06-20 LAB — MAGNESIUM: Magnesium: 1.8 mg/dL (ref 1.7–2.4)

## 2022-06-20 LAB — CBC
HCT: 27.4 % — ABNORMAL LOW (ref 36.0–46.0)
Hemoglobin: 8.8 g/dL — ABNORMAL LOW (ref 12.0–15.0)
MCH: 28 pg (ref 26.0–34.0)
MCHC: 32.1 g/dL (ref 30.0–36.0)
MCV: 87.3 fL (ref 80.0–100.0)
Platelets: 194 10*3/uL (ref 150–400)
RBC: 3.14 MIL/uL — ABNORMAL LOW (ref 3.87–5.11)
RDW: 14.7 % (ref 11.5–15.5)
WBC: 9.4 10*3/uL (ref 4.0–10.5)
nRBC: 0 % (ref 0.0–0.2)

## 2022-06-20 MED ORDER — MAGNESIUM OXIDE -MG SUPPLEMENT 400 (240 MG) MG PO TABS
800.0000 mg | ORAL_TABLET | Freq: Once | ORAL | Status: AC
  Administered 2022-06-20: 800 mg via ORAL
  Filled 2022-06-20: qty 2

## 2022-06-20 MED ORDER — POTASSIUM CHLORIDE CRYS ER 20 MEQ PO TBCR
40.0000 meq | EXTENDED_RELEASE_TABLET | Freq: Once | ORAL | Status: AC
  Administered 2022-06-20: 40 meq via ORAL
  Filled 2022-06-20: qty 2

## 2022-06-20 MED ORDER — FUROSEMIDE 10 MG/ML IJ SOLN
40.0000 mg | Freq: Once | INTRAMUSCULAR | Status: AC
  Administered 2022-06-20: 40 mg via INTRAVENOUS
  Filled 2022-06-20: qty 4

## 2022-06-20 NOTE — Plan of Care (Signed)
  Problem: Clinical Measurements: Goal: Will remain free from infection Outcome: Progressing Goal: Respiratory complications will improve Outcome: Progressing   Problem: Nutrition: Goal: Adequate nutrition will be maintained Outcome: Progressing   Problem: Coping: Goal: Level of anxiety will decrease Outcome: Progressing   Problem: Elimination: Goal: Will not experience complications related to urinary retention Outcome: Progressing   Problem: Pain Managment: Goal: General experience of comfort will improve Outcome: Progressing   Problem: Safety: Goal: Ability to remain free from injury will improve Outcome: Progressing   Problem: Skin Integrity: Goal: Risk for impaired skin integrity will decrease Outcome: Progressing

## 2022-06-20 NOTE — Progress Notes (Signed)
PROGRESS NOTE    [name]  UYQ:[id number] DOB: 1941/01/21 DOA: 06/10/2022 PCP: Kirk Ruths, MD   Brief Narrative:  81 y.o. Caucasian female with medical history significant for asthma, CHF, type diabetes mellitus, GERD, hypertension, dyslipidemia, CKD stage IV, hypothyroidism and OSA, who presented to the ER with acute onset of generalized weakness with associated nausea, vomiting and diarrhea. She has been having associated chest congestion and was recently diagnosed with COVID-19 on Monday.  She was started on p.o. molnupiravir.  Upon admission patient was noted to have worsening renal function concerns for ATN, nephrology team was consulted.  She had multiple electrolyte derangements requiring repletion.  Eventually I started given her IV Lasix which improved her renal function.   Assessment & Plan:  Principal Problem:   Pneumonia due to COVID-19 virus Active Problems:   Hypocalcemia   Right shoulder pain   Essential hypertension   CKD (chronic kidney disease), stage IV (HCC)   Dyslipidemia   Depression   AKI (acute kidney injury) ([place])   Hypomagnesemia   Hyperphosphatemia     Assessment and Plan: * Pneumonia due to COVID-19 virus Abnormal Breath Sounds.  Some improvement Concerns of superimposed aspiration pneumonia due to nausea and vomiting and volume overload.  She is still volume overloaded.  Currently on Molnupiravir continue and finish the course.  Completed 5-day course of Unasyn and doxycycline 9/14.  Bronchodilators, IS/Flutter.  Chest x-ray showed volume overload but doing better today.  Volume status is improved with IV Lasix.  We will repeat another dose today.  Echo showed EF of 60%.  AKI on CKD (chronic kidney disease), stage IV (HCC) - Cr Continues to climb up; concerns of HD and may need HD. Renal function continues to improve.  Creatinine today 2.59.  We will give another dose of IV Lasix.  Seen by nephrology team.  We will stop sodium  bicarb.   DM2 ISS and accuchecks.   Hypocalcemia -Prn repletion. Not on home meds.   Right shoulder pain - X-ray showing calcified tendinitis.  Pain control  Depression - On Zoloft  Dyslipidemia - Statin on hold while on Paxlovid   Essential hypertension - We will continue her antihypertensives.  Hypomagnesemia - Repletion  DVT prophylaxis: SQ Heparin Code Status: Full Family Communication: Family present at bedside  Status is: Inpatient Creatinine of breath slowly improving but not euvolemic yet.  Continue IV Lasix today.  Hopefully discharge in next 24-48 hours.  Subjective: Sitting In the recliner.  Family is present at bedside.  Tells me she has some exertional dyspnea but much improved at rest.  Also has some orthopnea  Examination: Constitutional: Not in acute distress.  3 L nasal cannula Respiratory: Bibasilar crackles Cardiovascular: Normal sinus rhythm, no rubs Abdomen: Nontender nondistended good bowel sounds Musculoskeletal: No edema noted Skin: No rashes seen Neurologic: CN 2-12 grossly intact.  And nonfocal Psychiatric: Normal judgment and insight. Alert and oriented x 3. Normal mood.   Objective: Vitals:   06/19/22 1847 06/19/22 2222 06/20/22 0518 06/20/22 0834  BP: (!) 147/52 (!) 182/75 (!) 151/55 (!) 164/57  Pulse: 67 70 64 64  Resp: 18 18 20 16   Temp: 98.6 F (37 C) 97.8 F (36.6 C) 98.9 F (37.2 C) 98.4 F (36.9 C)  TempSrc:   Oral   SpO2: 100% 99% 99% 99%  Weight:      Height:        Intake/Output Summary (Last 24 hours) at 06/20/2022 1233 Last data filed at 06/20/2022 [phone]  Gross per 24 hour  Intake 120 ml  Output 1800 ml  Net -1680 ml   Filed Weights   06/10/22 0141  Weight: 88.5 kg     Data Reviewed:   CBC: Recent Labs  Lab 06/14/22 0506 06/15/22 0658 06/16/22 0734 06/17/22 0603 06/18/22 0451 06/19/22 0543 06/20/22 0407  WBC 8.4   < > 9.4 9.1 8.6 8.3 9.4  NEUTROABS 5.0  --   --   --   --   --   --   HGB 9.5*    < > 8.9* 8.8* 8.6* 9.0* 8.8*  HCT 29.8*   < > 27.5* 27.7* 26.7* 27.1* 27.4*  MCV 88.7   < > 86.5 87.7 86.1 86.0 87.3  PLT 160   < > 161 160 177 180 194   < > = values in this interval not displayed.   Basic Metabolic Panel: Recent Labs  Lab 06/13/22 1812 06/14/22 0506 06/15/22 0658 06/16/22 0734 06/17/22 0603 06/18/22 0451 06/19/22 0543 06/20/22 0407  NA 137 138 134* 133* 134* 136 138 140  K 4.5 4.6 4.8 4.8 4.7 4.2 3.7 3.5  CL 109 112* 107 107 107 106 106 104  CO2 16* 15* 15* 13* 13* 15* 21* 26  GLUCOSE 104* 100* 99 92 99 100* 113* 107*  BUN 63* 66* 73* 77* 85* 92* 91* 81*  CREATININE 4.80* 4.54* 4.81* 4.86* 4.35* 3.58* 2.93* 2.59*  CALCIUM 6.6* 6.9* 7.5* 7.6* 8.2* 8.4* 8.9 8.7*  MG 1.1* 2.2 1.9 1.6* 2.0 2.0 1.8 1.8  PHOS 5.8* 6.1* 6.8*  --   --   --   --   --    GFR: Estimated Creatinine Clearance: 17.6 mL/min (A) (by C-G formula based on SCr of 2.59 mg/dL (H)). Liver Function Tests: Recent Labs  Lab 06/13/22 1812 06/14/22 0506  AST  --  22  ALT  --  13  ALKPHOS  --  50  BILITOT  --  0.7  PROT  --  6.6  ALBUMIN 3.1* 3.2*   No results for input(s): "LIPASE", "AMYLASE" in the last 168 hours. No results for input(s): "AMMONIA" in the last 168 hours. Coagulation Profile: No results for input(s): "INR", "PROTIME" in the last 168 hours. Cardiac Enzymes: No results for input(s): "CKTOTAL", "CKMB", "CKMBINDEX", "TROPONINI" in the last 168 hours. BNP (last 3 results) No results for input(s): "PROBNP" in the last 8760 hours. HbA1C: No results for input(s): "HGBA1C" in the last 72 hours.  CBG: Recent Labs  Lab 06/19/22 1216 06/19/22 1657 06/19/22 2232 06/20/22 0747 06/20/22 1150  GLUCAP 97 140* 115* 109* 135*   Lipid Profile: No results for input(s): "CHOL", "HDL", "LDLCALC", "TRIG", "CHOLHDL", "LDLDIRECT" in the last 72 hours. Thyroid Function Tests: No results for input(s): "TSH", "T4TOTAL", "FREET4", "T3FREE", "THYROIDAB" in the last 72 hours. Anemia  Panel: No results for input(s): "VITAMINB12", "FOLATE", "FERRITIN", "TIBC", "IRON", "RETICCTPCT" in the last 72 hours.  Sepsis Labs: No results for input(s): "PROCALCITON", "LATICACIDVEN" in the last 168 hours.   Recent Results (from the past 240 hour(s))  Urine Culture     Status: None   Collection Time: 06/13/22  5:47 AM   Specimen: Urine, Clean Catch  Result Value Ref Range Status   Specimen Description   Final    URINE, CLEAN CATCH Performed at [facility], [address] [place], [place] [address]    Special Requests   Final    NONE Performed at [facility], [place], [place]  [address]    Culture   Final    NO GROWTH Performed at [facility], [place] [address], [place], [place] [address]    Report Status 06/14/2022 FINAL  Final         Radiology Studies: ECHOCARDIOGRAM COMPLETE  Result Date: 06/20/2022    ECHOCARDIOGRAM REPORT   Patient Name:   [name] [facility] Date of Exam: 06/19/2022 Medical Rec #:  [id number]     Height:       62.0 in Accession #:    [id number]    Weight:       195.0 lb Date of Birth:  Feb 01, 1941     BSA:          1.891 m Patient Age:    29 years      BP:           184/67 mmHg Patient Gender: F             HR:           63 bpm. Exam Location:  [facility] Procedure: 2D Echo, Cardiac Doppler and Color Doppler Indications:     Cardiomyopathy-unspecified I42.9  History:         Patient has no prior history of Echocardiogram examinations.                  CHF; Risk Factors:Diabetes, Hypertension and Dyslipidemia.  Sonographer:     Sherrie Sport Referring Phys:  [id number] Jeanella Flattery Stuti Sandin Diagnosing Phys: Serafina Royals MD IMPRESSIONS  1. Left ventricular ejection fraction, by estimation, is 60 to 65%. The left ventricle has normal function. The left ventricle has no regional wall motion abnormalities. Left ventricular diastolic parameters were normal.  2. Right ventricular systolic function is normal. The right ventricular size is  normal.  3. Left atrial size was mildly dilated.  4. The mitral valve is normal in structure. Mild to moderate mitral valve regurgitation.  5. The aortic valve is normal in structure. Aortic valve regurgitation is not visualized. FINDINGS  Left Ventricle: Left ventricular ejection fraction, by estimation, is 60 to 65%. The left ventricle has normal function. The left ventricle has no regional wall motion abnormalities. The left ventricular internal cavity size was normal in size. There is  no left ventricular hypertrophy. Left ventricular diastolic parameters were normal. Right Ventricle: The right ventricular size is normal. No increase in right ventricular wall thickness. Right ventricular systolic function is normal. Left Atrium: Left atrial size was mildly dilated. Right Atrium: Right atrial size was normal in size. Pericardium: There is no evidence of pericardial effusion. Mitral Valve: The mitral valve is normal in structure. Mild to moderate mitral valve regurgitation. Tricuspid Valve: The tricuspid valve is normal in structure. Tricuspid valve regurgitation is mild. Aortic Valve: The aortic valve is normal in structure. Aortic valve regurgitation is not visualized. Aortic valve mean gradient measures 6.0 mmHg. Aortic valve peak gradient measures 10.5 mmHg. Aortic valve area, by VTI measures 2.49 cm. Pulmonic Valve: The pulmonic valve was normal in structure. Pulmonic valve regurgitation is trivial. Aorta: The aortic root and ascending aorta are structurally normal, with no evidence of dilitation. IAS/Shunts: No atrial level shunt detected by color flow Doppler.  LEFT VENTRICLE PLAX 2D LVIDd:         4.90 cm   Diastology LVIDs:         3.40 cm   LV e' medial:    7.40 cm/s LV PW:         1.30  cm   LV E/e' medial:  13.3 LV IVS:        0.90 cm   LV e' lateral:   8.70 cm/s LVOT diam:     2.00 cm   LV E/e' lateral: 11.3 LV SV:         86 LV SV Index:   46 LVOT Area:     3.14 cm  RIGHT VENTRICLE RV Basal diam:   3.60 cm RV Mid diam:    2.80 cm RV S prime:     20.10 cm/s TAPSE (M-mode): 2.5 cm LEFT ATRIUM             Index        RIGHT ATRIUM           Index LA diam:        5.30 cm 2.80 cm/m   RA Area:     17.10 cm LA Vol (A2C):   84.9 ml 44.89 ml/m  RA Volume:   47.10 ml  24.90 ml/m LA Vol (A4C):   68.4 ml 36.17 ml/m LA Biplane Vol: 76.6 ml 40.50 ml/m  AORTIC VALVE AV Area (Vmax):    2.44 cm AV Area (Vmean):   2.49 cm AV Area (VTI):     2.49 cm AV Vmax:           162.00 cm/s AV Vmean:          107.000 cm/s AV VTI:            0.346 m AV Peak Grad:      10.5 mmHg AV Mean Grad:      6.0 mmHg LVOT Vmax:         126.00 cm/s LVOT Vmean:        84.900 cm/s LVOT VTI:          0.274 m LVOT/AV VTI ratio: 0.79  AORTA Ao Root diam: 3.20 cm MITRAL VALVE               TRICUSPID VALVE MV Area (PHT): 4.29 cm    TR Peak grad:   34.6 mmHg MV Decel Time: 177 msec    TR Vmax:        294.00 cm/s MV E velocity: 98.50 cm/s MV A velocity: 92.20 cm/s  SHUNTS MV E/A ratio:  1.07        Systemic VTI:  0.27 m                            Systemic Diam: 2.00 cm Serafina Royals MD Electronically signed by Serafina Royals MD Signature Date/Time: 06/20/2022/12:22:22 PM    Final         Scheduled Meds:  amLODipine  5 mg Oral Daily   vitamin C  500 mg Oral Daily   azelastine  1 spray Each Nare BID   calcitRIOL  0.5 mcg Oral Daily   diclofenac Sodium  2 g Topical QID   heparin  5,000 Units Subcutaneous Q8H   hydrALAZINE  100 mg Oral BID   insulin aspart  0-9 Units Subcutaneous TID WC   Ipratropium-Albuterol  1 puff Inhalation Q6H   levothyroxine  150 mcg Oral Q0600   lidocaine  1 patch Transdermal Q12H   loratadine  10 mg Oral Daily   metoprolol tartrate  50 mg Oral BID   montelukast  10 mg Oral QHS   multivitamin with minerals  1 tablet Oral Daily   pantoprazole  40 mg Oral  QAC breakfast   sertraline  100 mg Oral Daily   zinc sulfate  220 mg Oral Daily   Continuous Infusions:  promethazine (PHENERGAN) injection (IM or IVPB)  Stopped (06/11/22 1113)     LOS: 10 days   Time spent= 35 mins    Janilah Hojnacki Arsenio Loader, MD [facility]  If 7PM-7AM, please contact night-coverage  06/20/2022, 12:33 PM

## 2022-06-20 NOTE — Progress Notes (Signed)
[facility]  ROUNDING NOTE   Subjective:   [name] is a 81 year old female with medical concerns including GERD, diabetes, hypertension, dyslipidemia, hypothyroidism, and chronic kidney disease stage IV.  Patient presents to the emergency department complaining of shortness of breath and has been admitted for Hypocalcemia [E83.51] Generalized weakness [R53.1] Nausea vomiting and diarrhea [R11.2, R19.7] Pneumonia due to COVID-19 virus [U07.1, J12.82] COVID-19 [U07.1]  Patient is known to our practice and is followed outpatient by Dr. Holley Raring.    Patient sitting up in bed States she feels well today Respiratory status improved.   Creatinine 2.59   Objective:  Vital signs in last 24 hours:  Temp:  [97.8 F (36.6 C)-98.9 F (37.2 C)] 98.4 F (36.9 C) (09/19 0834) Pulse Rate:  [64-70] 64 (09/19 0834) Resp:  [16-20] 16 (09/19 0834) BP: (147-182)/(52-75) 164/57 (09/19 0834) SpO2:  [99 %-100 %] 99 % (09/19 0834)  Weight change:  Filed Weights   06/10/22 0141  Weight: 88.5 kg    Intake/Output: I/O last 3 completed shifts: In: 120 [P.O.:120] Out: 2200 [Urine:2200]   Intake/Output this shift:  No intake/output data recorded.  Physical Exam: General: NAD  Head: Normocephalic, atraumatic. Moist oral mucosal membranes  Eyes: Anicteric  Lungs:  Diminished in bases  Tachypnea  Heart: Regular rate and rhythm  Abdomen:  Soft, nontender, obese  Extremities: + peripheral edema.  Neurologic: Nonfocal, moving all four extremities  Skin: No lesions, dry lower extremities  Access: Lt AVF (maturing)    Basic Metabolic Panel: Recent Labs  Lab 06/13/22 1812 06/14/22 0506 06/15/22 6568 06/16/22 0734 06/17/22 0603 06/18/22 0451 06/19/22 0543 06/20/22 0407  NA 137 138 134* 133* 134* 136 138 140  K 4.5 4.6 4.8 4.8 4.7 4.2 3.7 3.5  CL 109 112* 107 107 107 106 106 104  CO2 16* 15* 15* 13* 13* 15* 21* 26  GLUCOSE 104* 100* 99 92 99 100* 113* 107*  BUN 63*  66* 73* 77* 85* 92* 91* 81*  CREATININE 4.80* 4.54* 4.81* 4.86* 4.35* 3.58* 2.93* 2.59*  CALCIUM 6.6* 6.9* 7.5* 7.6* 8.2* 8.4* 8.9 8.7*  MG 1.1* 2.2 1.9 1.6* 2.0 2.0 1.8 1.8  PHOS 5.8* 6.1* 6.8*  --   --   --   --   --      Liver Function Tests: Recent Labs  Lab 06/13/22 1812 06/14/22 0506  AST  --  22  ALT  --  13  ALKPHOS  --  50  BILITOT  --  0.7  PROT  --  6.6  ALBUMIN 3.1* 3.2*    No results for input(s): "LIPASE", "AMYLASE" in the last 168 hours. No results for input(s): "AMMONIA" in the last 168 hours.  CBC: Recent Labs  Lab 06/14/22 0506 06/15/22 0658 06/16/22 0734 06/17/22 0603 06/18/22 0451 06/19/22 0543 06/20/22 0407  WBC 8.4   < > 9.4 9.1 8.6 8.3 9.4  NEUTROABS 5.0  --   --   --   --   --   --   HGB 9.5*   < > 8.9* 8.8* 8.6* 9.0* 8.8*  HCT 29.8*   < > 27.5* 27.7* 26.7* 27.1* 27.4*  MCV 88.7   < > 86.5 87.7 86.1 86.0 87.3  PLT 160   < > 161 160 177 180 194   < > = values in this interval not displayed.     Cardiac Enzymes: No results for input(s): "CKTOTAL", "CKMB", "CKMBINDEX", "TROPONINI" in the last 168 hours.  BNP: Invalid  input(s): "POCBNP"  CBG: Recent Labs  Lab 06/19/22 0809 06/19/22 1216 06/19/22 1657 06/19/22 2232 06/20/22 0747  GLUCAP 106* 97 140* 115* 109*     Microbiology: Results for orders placed or performed during the hospital encounter of 06/10/22  Culture, blood (routine x 2)     Status: None   Collection Time: 06/10/22  2:04 AM   Specimen: BLOOD RIGHT ARM  Result Value Ref Range Status   Specimen Description BLOOD RIGHT ARM  Final   Special Requests   Final    BOTTLES DRAWN AEROBIC AND ANAEROBIC Blood Culture adequate volume   Culture   Final    NO GROWTH 5 DAYS Performed at [facility], [address] [place], [place] [address]    Report Status 06/15/2022 FINAL  Final  Culture, blood (routine x 2)     Status: None   Collection Time: 06/10/22  2:04 AM   Specimen: BLOOD RIGHT ARM  Result Value Ref  Range Status   Specimen Description BLOOD RIGHT ARM  Final   Special Requests   Final    BOTTLES DRAWN AEROBIC AND ANAEROBIC Blood Culture results may not be optimal due to an inadequate volume of blood received in culture bottles   Culture   Final    NO GROWTH 5 DAYS Performed at [facility], [address], [place], [place] [address]    Report Status 06/15/2022 FINAL  Final  Urine Culture     Status: None   Collection Time: 06/13/22  5:47 AM   Specimen: Urine, Clean Catch  Result Value Ref Range Status   Specimen Description   Final    URINE, CLEAN CATCH Performed at [facility], [address], [place], [place] [address]    Special Requests   Final    NONE Performed at [facility], [address] [place], [place] [address]    Culture   Final    NO GROWTH Performed at [facility], [place] [address], [place], [place] [address]    Report Status 06/14/2022 FINAL  Final    Coagulation Studies: No results for input(s): "LABPROT", "INR" in the last 72 hours.  Urinalysis: No results for input(s): "COLORURINE", "LABSPEC", "PHURINE", "GLUCOSEU", "HGBUR", "BILIRUBINUR", "KETONESUR", "PROTEINUR", "UROBILINOGEN", "NITRITE", "LEUKOCYTESUR" in the last 72 hours.  Invalid input(s): "APPERANCEUR"     Imaging: No results found.   Medications:    promethazine (PHENERGAN) injection (IM or IVPB) Stopped (06/11/22 1113)    amLODipine  5 mg Oral Daily   vitamin C  500 mg Oral Daily   azelastine  1 spray Each Nare BID   calcitRIOL  0.5 mcg Oral Daily   diclofenac Sodium  2 g Topical QID   furosemide  40 mg Intravenous Once   heparin  5,000 Units Subcutaneous Q8H   hydrALAZINE  100 mg Oral BID   insulin aspart  0-9 Units Subcutaneous TID WC   Ipratropium-Albuterol  1 puff Inhalation Q6H   levothyroxine  150 mcg Oral Q0600   lidocaine  1 patch Transdermal Q12H   loratadine  10 mg Oral Daily   metoprolol tartrate  50 mg Oral BID   montelukast   10 mg Oral QHS   multivitamin with minerals  1 tablet Oral Daily   pantoprazole  40 mg Oral QAC breakfast   sertraline  100 mg Oral Daily   zinc sulfate  220 mg Oral Daily   acetaminophen, albuterol, chlorpheniramine-HYDROcodone, guaiFENesin-dextromethorphan, hydrALAZINE, HYDROcodone-acetaminophen, loperamide, metoprolol tartrate, polyethylene glycol, promethazine (  PHENERGAN) injection (IM or IVPB), senna-docusate, traZODone  Assessment/ Plan:  [name] is a 81 y.o.  female with medical concerns including GERD, diabetes, hypertension, dyslipidemia, hypothyroidism, and chronic kidney disease stage IV.  Patient presents to the emergency department complaining of shortness of breath and has been admitted for Hypocalcemia [E83.51] Generalized weakness [R53.1] Nausea vomiting and diarrhea [R11.2, R19.7] Pneumonia due to COVID-19 virus [U07.1, J12.82] COVID-19 [U07.1]   Acute Kidney Injury on chronic kidney disease stage 4 with baseline creatinine 2.7 and GFR of 17 on 05/09/2022.  Acute kidney injury secondary to hypovolemia and severe ATN secondary to concurrent infection. Chronic kidney disease is secondary to advanced age, hypertension and diabetes CT abdomen pelvis shows multiple renal cysts in right kidney, left kidney within normal limits with no obstruction. Serum creatinine remains critically elevated.   Creatinine continues to improve. UOP 2.2L in 24 hours. No acute need for dialysis at this time. Due to Covid 19 infection and any steroid use, BUN will remain elevated. Will need follow up in our office at discharge.    Lab Results  Component Value Date   CREATININE 2.59 (H) 06/20/2022   CREATININE 2.93 (H) 06/19/2022   CREATININE 3.58 (H) 06/18/2022    Intake/Output Summary (Last 24 hours) at 06/20/2022 1141 Last data filed at 06/20/2022 0506 Gross per 24 hour  Intake 120 ml  Output 2050 ml  Net -1930 ml     2. Hypocalcemia, severe.  Calcium 5.7 on admission.    Continue calcitriol daily. Calcium slowly improving  3. Anemia of chronic kidney disease Lab Results  Component Value Date   HGB 8.8 (L) 06/20/2022    Hgb below desired target. Continue to monitor.   4. Hypertension with chronic kidney disease. Home regimen includes amlodipine, hydralazine, losartan, metoprolol, spironolactone, and torsemide.  Scheduled doses of diuretics remaining held.  Primary team has ordered one time dose of Furosemide 15m IV today.   5. Diabetes mellitus type II with chronic kidney disease/renal manifestations: noninsulin dependent.Most recent hemoglobin A1c is 6.0% on June 14, 2022.  Glucose well controlled  6.  COVID-19 pneumonia.  Patient completed ampicillin-sulbactam and doxycycline, per primary team.  Continue supportive care. Remains on 3L [place].   LOS: 1[place]line9/19/202311:41 AM

## 2022-06-21 DIAGNOSIS — I5033 Acute on chronic diastolic (congestive) heart failure: Secondary | ICD-10-CM

## 2022-06-21 DIAGNOSIS — N179 Acute kidney failure, unspecified: Secondary | ICD-10-CM

## 2022-06-21 DIAGNOSIS — U071 COVID-19: Secondary | ICD-10-CM | POA: Diagnosis not present

## 2022-06-21 DIAGNOSIS — N184 Chronic kidney disease, stage 4 (severe): Secondary | ICD-10-CM | POA: Diagnosis not present

## 2022-06-21 DIAGNOSIS — J9601 Acute respiratory failure with hypoxia: Secondary | ICD-10-CM

## 2022-06-21 LAB — BASIC METABOLIC PANEL
Anion gap: 8 (ref 5–15)
BUN: 74 mg/dL — ABNORMAL HIGH (ref 8–23)
CO2: 25 mmol/L (ref 22–32)
Calcium: 9 mg/dL (ref 8.9–10.3)
Chloride: 107 mmol/L (ref 98–111)
Creatinine, Ser: 2.42 mg/dL — ABNORMAL HIGH (ref 0.44–1.00)
GFR, Estimated: 20 mL/min — ABNORMAL LOW (ref >60)
Glucose, Bld: 110 mg/dL — ABNORMAL HIGH (ref 70–99)
Potassium: 4 mmol/L (ref 3.5–5.1)
Sodium: 140 mmol/L (ref 135–145)

## 2022-06-21 LAB — CBC
HCT: 27.2 % — ABNORMAL LOW (ref 36.0–46.0)
Hemoglobin: 8.7 g/dL — ABNORMAL LOW (ref 12.0–15.0)
MCH: 28.4 pg (ref 26.0–34.0)
MCHC: 32 g/dL (ref 30.0–36.0)
MCV: 88.9 fL (ref 80.0–100.0)
Platelets: 179 10*3/uL (ref 150–400)
RBC: 3.06 MIL/uL — ABNORMAL LOW (ref 3.87–5.11)
RDW: 14.8 % (ref 11.5–15.5)
WBC: 9.4 10*3/uL (ref 4.0–10.5)
nRBC: 0 % (ref 0.0–0.2)

## 2022-06-21 LAB — GLUCOSE, CAPILLARY
Glucose-Capillary: 134 mg/dL — ABNORMAL HIGH (ref 70–99)
Glucose-Capillary: 88 mg/dL (ref 70–99)

## 2022-06-21 LAB — MAGNESIUM: Magnesium: 1.7 mg/dL (ref 1.7–2.4)

## 2022-06-21 MED ORDER — CALCITRIOL 0.5 MCG PO CAPS: 0.5000 ug | ORAL_CAPSULE | Freq: Every day | ORAL | 0 refills | Status: DC

## 2022-06-21 NOTE — Care Management Important Message (Signed)
Important Message  Patient Details  Name: [name] MRN: [id number] Date of Birth: September 19, 1941   Medicare Important Message Given:  Yes  Patient is in an isolation room so I reviewed the Important Message from Medicare by phone with her and she is in agreement with her discharge. I wished her a speedy recovery and thanked her for her time.   Juliann Pulse A Yehudit Fulginiti 06/21/2022, 12:59 PM

## 2022-06-21 NOTE — Plan of Care (Signed)
  Problem: Education: Goal: Knowledge of risk factors and measures for prevention of condition will improve Outcome: Progressing   Problem: Coping: Goal: Psychosocial and spiritual needs will be supported Outcome: Progressing   Problem: Respiratory: Goal: Will maintain a patent airway Outcome: Progressing Goal: Complications related to the disease process, condition or treatment will be avoided or minimized Outcome: Progressing   Problem: Education: Goal: Knowledge of General Education information will improve Description: Including pain rating scale, medication(s)/side effects and non-pharmacologic comfort measures Outcome: Progressing   Problem: Health Behavior/Discharge Planning: Goal: Ability to manage health-related needs will improve Outcome: Progressing   Problem: Clinical Measurements: Goal: Ability to maintain clinical measurements within normal limits will improve Outcome: Progressing Goal: Will remain free from infection Outcome: Progressing Goal: Diagnostic test results will improve Outcome: Progressing Goal: Respiratory complications will improve Outcome: Progressing Goal: Cardiovascular complication will be avoided Outcome: Progressing   Problem: Activity: Goal: Risk for activity intolerance will decrease Outcome: Progressing   Problem: Nutrition: Goal: Adequate nutrition will be maintained Outcome: Progressing   Problem: Coping: Goal: Level of anxiety will decrease Outcome: Progressing   Problem: Elimination: Goal: Will not experience complications related to bowel motility Outcome: Progressing Goal: Will not experience complications related to urinary retention Outcome: Progressing   Problem: Pain Managment: Goal: General experience of comfort will improve Outcome: Progressing   Problem: Safety: Goal: Ability to remain free from injury will improve Outcome: Progressing   Problem: Skin Integrity: Goal: Risk for impaired skin integrity will  decrease Outcome: Progressing   Problem: Education: Goal: Ability to describe self-care measures that may prevent or decrease complications (Diabetes Survival Skills Education) will improve Outcome: Progressing Goal: Individualized Educational Video(s) Outcome: Progressing   Problem: Coping: Goal: Ability to adjust to condition or change in health will improve Outcome: Progressing   Problem: Fluid Volume: Goal: Ability to maintain a balanced intake and output will improve Outcome: Progressing   Problem: Health Behavior/Discharge Planning: Goal: Ability to identify and utilize available resources and services will improve Outcome: Progressing Goal: Ability to manage health-related needs will improve Outcome: Progressing   Problem: Metabolic: Goal: Ability to maintain appropriate glucose levels will improve Outcome: Progressing   Problem: Nutritional: Goal: Maintenance of adequate nutrition will improve Outcome: Progressing Goal: Progress toward achieving an optimal weight will improve Outcome: Progressing   Problem: Skin Integrity: Goal: Risk for impaired skin integrity will decrease Outcome: Progressing   Problem: Tissue Perfusion: Goal: Adequacy of tissue perfusion will improve Outcome: Progressing   

## 2022-06-21 NOTE — Discharge Summary (Signed)
Physician Discharge Summary   Patient: [name] MRN: [id number] DOB: [birth year]  Admit date:     06/10/2022  Discharge date: 06/21/22  Discharge Physician: Sharen Hones   PCP: Kirk Ruths, MD   Recommendations at discharge:   Follow-up with PCP in 1 week. Follow-up with nephrology as scheduled. Recheck a BMP in the next office visit.  Discharge Diagnoses: Principal Problem:   Pneumonia due to COVID-19 virus Active Problems:   Hypocalcemia   Right shoulder pain   Essential hypertension   CKD (chronic kidney disease), stage IV (HCC)   Dyslipidemia   Depression   AKI (acute kidney injury) ([place])   Hypomagnesemia   Hyperphosphatemia  Resolved Problems:   * No resolved hospital problems. [facility] Course: [name] is a 81 y.o. Caucasian female with medical history significant for asthma, CHF, type diabetes mellitus, GERD, hypertension, dyslipidemia, CKD stage IV, hypothyroidism and OSA, who presented to the ER with acute onset of generalized weakness with associated nausea, vomiting and diarrhea.  She has been having associated chest congestion and was recently diagnosed with COVID-19 on Monday.  She was started on p.o. molnupiravir.  She took 1 dose on Tuesday and has remaining 3 doses left.  She has been vaccinated for COVID-19.   ED 09/08-early admission 09/09: Tachypneic with RR 22, 32, VSS otherwise.  Creatinine 2.4, BUN 65, comparable to previous levels.  Hypocalcemia.  Troponin 20, 19.  CXR mild patchy opacity medial right lung base, concern for pneumonia.  Treated with calcium supplementation, Zofran, 1 bolus IV normal saline, 1 dose of levofloxacin.  CT abdomen/pelvis done given nausea/vomiting and GI upset, no acute findings. Admitted to hospitalist service for COVID-19 pneumonia, DDx aspiration pneumonia.  Paxlovid, renally adjusted.  Unasyn/Zithromax for pneumonia. She is unable to keep po down, changed IV meds as able and sterted antiemetic regimen. Holding  antivirals for now until can take po  09/10: Still concerned about nausea but she is tolerating p.o.  And still needing oxygen 09/11: Goal wean down O2. BCx NG x2d. Hypocalcemia, hypomagnesemia, hyperphosphatemia and AKI on CKD4 --> consult nephrology  09/12: electrolytes improving but renal function worse.  Continue IV fluid another 24 hours.  9/20.  Patient condition has improved, renal function has back to baseline.  Patient is down 2 L oxygen, currently with good saturation, will obtain home oxygen evaluation.  Assessment and Plan:  Pneumonia due to COVID-19 virus Acute on chronic diastolic congestive heart failure ruled in. Acute hypoxemic raspatory failure secondary to COVID as well as congestive heart failure.  Ruled in. Likely aspiration pneumonia. Patient clearly has volume overload with increased short of breath and hypoxemia.  This has showed up in the chest x-ray, patient received furosemide, condition improving after furosemide.  As result, patient had acute on chronic diastolic congestive heart failure, she also requiring 3 L oxygen during the hospital stay, consistent with acute hypoxemic respiratory failure.  Cardiogram showed ejection fraction 60%. Patient so far has completed antibiotics, as well as diuretics.  She also completed a course of Molnupiravir.  currently he is doing much better.  Will obtain home oxygen evaluation before discharge.   AKI on CKD (chronic kidney disease), stage IV (HCC) Hyponatremia. Metabolic acidosis. Hypomagnesemia. Is followed by nephrology, renal function much better after given IV Lasix.  Confirming the presence of severe volume overload.  Patient be continued with nephrology as outpatient.     DM2 Follow-up with PCP as outpatient.   Hypocalcemia Continue calcitriol.  Right shoulder pain  X-ray showing calcified tendinitis.  Pain control   Depression - On Zoloft   Dyslipidemia Resume home dose statin.     Essential  hypertension Resume home medicines.         Consultants: Nephrology Procedures performed: None  Disposition: Home health Diet recommendation:  Discharge Diet Orders (From admission, onward)     Start     Ordered   06/21/22 0000  Diet - low sodium heart healthy        06/21/22 1304           Cardiac diet DISCHARGE MEDICATION: Allergies as of 06/21/2022       Reactions   Ace Inhibitors    Other reaction(s): Unknown   Eggs Or Egg-derived Products Diarrhea   Other    Other reaction(s): Other (See Comments) Eggs   Prednisone    Other reaction(s): Other (See Comments) joint pain   Risedronate    Other reaction(s): Other (See Comments)   Sulfa Antibiotics    Other reaction(s): Other (See Comments)   Sulfasalazine Other (See Comments)        Medication List     STOP taking these medications    diclofenac Sodium 1 % Gel Commonly known as: VOLTAREN   fluticasone 50 MCG/ACT nasal spray Commonly known as: FLONASE   loperamide 2 MG capsule Commonly known as: IMODIUM   losartan 100 MG tablet Commonly known as: COZAAR   Oscal 500/200 D-3 500-200 MG-UNIT tablet Generic drug: calcium-vitamin D   potassium chloride SA 20 MEQ tablet Commonly known as: KLOR-CON M   spironolactone 25 MG tablet Commonly known as: ALDACTONE   torsemide 10 MG tablet Commonly known as: DEMADEX       TAKE these medications    acetaminophen 500 MG tablet Commonly known as: TYLENOL Take 1,000 mg by mouth every 6 (six) hours as needed (pain).   albuterol 108 (90 Base) MCG/ACT inhaler Commonly known as: VENTOLIN HFA Inhale 1-2 puffs into the lungs every 4 (four) hours as needed for wheezing or shortness of breath.   amLODipine 5 MG tablet Commonly known as: NORVASC Take 5 mg by mouth daily. What changed: Another medication with the same name was removed. Continue taking this medication, and follow the directions you see here.   azelastine 0.1 % nasal spray Commonly known  as: ASTELIN ONE SPRAY INTO BOTH NOSTRILS TWICE DAILY   calcitRIOL 0.5 MCG capsule Commonly known as: ROCALTROL Take 1 capsule (0.5 mcg total) by mouth daily. Start taking on: June 22, 2022   CALCIUM & VIT D3 BONE HEALTH PO Take 1 tablet by mouth daily. 600 mg/ 25 mg   esomeprazole 40 MG packet Commonly known as: NEXIUM Take 40 mg by mouth daily before breakfast.   Fluticasone-Salmeterol 250-50 MCG/DOSE Aepb Commonly known as: ADVAIR Inhale 1 puff into the lungs 2 (two) times daily as needed (asthma). What changed: Another medication with the same name was removed. Continue taking this medication, and follow the directions you see here.   hydrALAZINE 100 MG tablet Commonly known as: APRESOLINE Take 100 mg by mouth 2 (two) times daily.   HYDROcodone-acetaminophen 5-325 MG tablet Commonly known as: Norco Take 1 tablet by mouth every 6 (six) hours as needed for moderate pain.   Lagevrio 200 MG Caps capsule Generic drug: molnupiravir EUA Take 4 capsules by mouth 2 (two) times daily.   levothyroxine 137 MCG tablet Commonly known as: SYNTHROID Take 1 tablet (137 mcg total) by mouth daily at 6 (  six) AM.   lidocaine 5 % Commonly known as: Lidoderm Place 1 patch onto the skin every 12 (twelve) hours. Remove & Discard patch within 12 hours or as directed by MD   loratadine 10 MG tablet Commonly known as: CLARITIN Take 10 mg by mouth daily.   metoprolol tartrate 50 MG tablet Commonly known as: LOPRESSOR Take 50 mg by mouth 2 (two) times daily.   montelukast 10 MG tablet Commonly known as: SINGULAIR Take 10 mg by mouth at bedtime.   rosuvastatin 40 MG tablet Commonly known as: CRESTOR Take 40 mg by mouth daily.   sertraline 100 MG tablet Commonly known as: ZOLOFT Take 100 mg by mouth daily.               Durable Medical Equipment  (From admission, onward)           Start     Ordered   06/13/22 1524  For home use only DME Other see comment  Once        Comments: Shower seat  Question:  Length of Need  Answer:  6 Months   06/13/22 1523            Follow-up Information     Kirk Ruths, MD Follow up in 1 week(s).   Specialty: Internal Medicine Contact information: [address] [place] [address] [phone]                Discharge Exam: Danley Danker Weights   06/10/22 0141  Weight: 88.5 kg   General exam: Appears calm and comfortable  Respiratory system: Clear to auscultation. Respiratory effort normal. Cardiovascular system: S1 & S2 heard, RRR. No JVD, murmurs, rubs, gallops or clicks. No pedal edema. Gastrointestinal system: Abdomen is nondistended, soft and nontender. No organomegaly or masses felt. Normal bowel sounds heard. Central nervous system: Alert and oriented. No focal neurological deficits. Extremities: Symmetric 5 x 5 power. Skin: No rashes, lesions or ulcers Psychiatry: Judgement and insight appear normal. Mood & affect appropriate.    Condition at discharge: good  The results of significant diagnostics from this hospitalization (including imaging, microbiology, ancillary and laboratory) are listed below for reference.   Imaging Studies: ECHOCARDIOGRAM COMPLETE  Result Date: 06/20/2022    ECHOCARDIOGRAM REPORT   Patient Name:   YARNELL KOZLOSKI [facility] Date of Exam: 06/19/2022 Medical Rec #:  [id number]     Height:       62.0 in Accession #:    [id number]    Weight:       195.0 lb Date of Birth:  Apr 18, 1941     BSA:          1.891 m Patient Age:    86 years      BP:           184/67 mmHg Patient Gender: F             HR:           63 bpm. Exam Location:  [facility] Procedure: 2D Echo, Cardiac Doppler and Color Doppler Indications:     Cardiomyopathy-unspecified I42.9  History:         Patient has no prior history of Echocardiogram examinations.                  CHF; Risk Factors:Diabetes, Hypertension and Dyslipidemia.  Sonographer:     Sherrie Sport Referring Phys:  [id number] Jeanella Flattery AMIN Diagnosing Phys:  Serafina Royals MD IMPRESSIONS  1. Left ventricular ejection fraction, by  estimation, is 60 to 65%. The left ventricle has normal function. The left ventricle has no regional wall motion abnormalities. Left ventricular diastolic parameters were normal.  2. Right ventricular systolic function is normal. The right ventricular size is normal.  3. Left atrial size was mildly dilated.  4. The mitral valve is normal in structure. Mild to moderate mitral valve regurgitation.  5. The aortic valve is normal in structure. Aortic valve regurgitation is not visualized. FINDINGS  Left Ventricle: Left ventricular ejection fraction, by estimation, is 60 to 65%. The left ventricle has normal function. The left ventricle has no regional wall motion abnormalities. The left ventricular internal cavity size was normal in size. There is  no left ventricular hypertrophy. Left ventricular diastolic parameters were normal. Right Ventricle: The right ventricular size is normal. No increase in right ventricular wall thickness. Right ventricular systolic function is normal. Left Atrium: Left atrial size was mildly dilated. Right Atrium: Right atrial size was normal in size. Pericardium: There is no evidence of pericardial effusion. Mitral Valve: The mitral valve is normal in structure. Mild to moderate mitral valve regurgitation. Tricuspid Valve: The tricuspid valve is normal in structure. Tricuspid valve regurgitation is mild. Aortic Valve: The aortic valve is normal in structure. Aortic valve regurgitation is not visualized. Aortic valve mean gradient measures 6.0 mmHg. Aortic valve peak gradient measures 10.5 mmHg. Aortic valve area, by VTI measures 2.49 cm. Pulmonic Valve: The pulmonic valve was normal in structure. Pulmonic valve regurgitation is trivial. Aorta: The aortic root and ascending aorta are structurally normal, with no evidence of dilitation. IAS/Shunts: No atrial level shunt detected by color flow Doppler.  LEFT VENTRICLE  PLAX 2D LVIDd:         4.90 cm   Diastology LVIDs:         3.40 cm   LV e' medial:    7.40 cm/s LV PW:         1.30 cm   LV E/e' medial:  13.3 LV IVS:        0.90 cm   LV e' lateral:   8.70 cm/s LVOT diam:     2.00 cm   LV E/e' lateral: 11.3 LV SV:         86 LV SV Index:   46 LVOT Area:     3.14 cm  RIGHT VENTRICLE RV Basal diam:  3.60 cm RV Mid diam:    2.80 cm RV S prime:     20.10 cm/s TAPSE (M-mode): 2.5 cm LEFT ATRIUM             Index        RIGHT ATRIUM           Index LA diam:        5.30 cm 2.80 cm/m   RA Area:     17.10 cm LA Vol (A2C):   84.9 ml 44.89 ml/m  RA Volume:   47.10 ml  24.90 ml/m LA Vol (A4C):   68.4 ml 36.17 ml/m LA Biplane Vol: 76.6 ml 40.50 ml/m  AORTIC VALVE AV Area (Vmax):    2.44 cm AV Area (Vmean):   2.49 cm AV Area (VTI):     2.49 cm AV Vmax:           162.00 cm/s AV Vmean:          107.000 cm/s AV VTI:            0.346 m AV Peak Grad:  10.5 mmHg AV Mean Grad:      6.0 mmHg LVOT Vmax:         126.00 cm/s LVOT Vmean:        84.900 cm/s LVOT VTI:          0.274 m LVOT/AV VTI ratio: 0.79  AORTA Ao Root diam: 3.20 cm MITRAL VALVE               TRICUSPID VALVE MV Area (PHT): 4.29 cm    TR Peak grad:   34.6 mmHg MV Decel Time: 177 msec    TR Vmax:        294.00 cm/s MV E velocity: 98.50 cm/s MV A velocity: 92.20 cm/s  SHUNTS MV E/A ratio:  1.07        Systemic VTI:  0.27 m                            Systemic Diam: 2.00 cm Serafina Royals MD Electronically signed by Serafina Royals MD Signature Date/Time: 06/20/2022/12:22:22 PM    Final    DG Chest Port 1 View  Result Date: 06/17/2022 CLINICAL DATA:  Generalized weakness with associated nausea, vomiting and diarrhea. Recent diagnosis of COVID-19. EXAM: PORTABLE CHEST 1 VIEW COMPARISON:  06/10/2022. FINDINGS: Cardiac silhouette mildly enlarged.  No mediastinal or hilar masses. Bilateral vascular congestion and interstitial prominence. Linear type opacities noted at the lung bases consistent with atelectasis. Additional opacity  at both lung bases, left greater than right, consistent with small effusions. Remainder of the lungs is clear.  No pneumothorax. Skeletal structures are grossly intact. IMPRESSION: 1. Cardiomegaly, vascular congestion and interstitial prominence associated with small bilateral effusions suggest mild congestive heart failure. Electronically Signed   By: Lajean Manes M.D.   On: 06/17/2022 11:23   DG Shoulder Right Port  Result Date: 06/10/2022 CLINICAL DATA:  Recent fall.  Shoulder pain. EXAM: RIGHT SHOULDER - 1 VIEW COMPARISON:  No comparison studies available. FINDINGS: Bones are demineralized. No fracture or evidence of dislocation. Degenerative changes are seen in the glenohumeral joint. Calcific tendinitis noted at the rotator cuff insertion. IMPRESSION: 1. No acute bony abnormality. 2. Degenerative changes in the glenohumeral joint. 3. Calcific tendinitis. Electronically Signed   By: Misty Stanley M.D.   On: 06/10/2022 07:45   CT ABDOMEN PELVIS WO CONTRAST  Result Date: 06/10/2022 CLINICAL DATA:  Weakness, nausea/vomiting, COVID EXAM: CT ABDOMEN AND PELVIS WITHOUT CONTRAST TECHNIQUE: Multidetector CT imaging of the abdomen and pelvis was performed following the standard protocol without IV contrast. RADIATION DOSE REDUCTION: This exam was performed according to the departmental dose-optimization program which includes automated exposure control, adjustment of the mA and/or kV according to patient size and/or use of iterative reconstruction technique. COMPARISON:  12/29/2016 FINDINGS: Lower chest: Lung bases are clear. Hepatobiliary: Unenhanced liver is unremarkable. Status post cholecystectomy. No intrahepatic or extrahepatic ductal dilatation. Pancreas: Within normal limits. Spleen: Within normal limits. Adrenals/Urinary Tract: Adrenal glands are within normal limits. Right renal cysts, including a dominant 6.3 cm cyst in the right lower kidney (series 2/image 27), measuring simple fluid density, benign  (Bosniak I). Left kidney is within normal limits. No hydronephrosis. Bladder is within normal limits. Stomach/Bowel: Stomach is within normal limits. No evidence of bowel obstruction. Appendix is not discretely visualized. Sigmoid diverticulosis, without evidence of diverticulitis. Vascular/Lymphatic: No evidence of abdominal aortic aneurysm. Atherosclerotic calcifications of the abdominal aorta and branch vessels. No suspicious abdominopelvic lymphadenopathy. Reproductive: Status post hysterectomy. Bilateral ovaries  are within normal limits. Other: No abdominopelvic ascites. Musculoskeletal: Mild degenerative changes of the visualized thoracolumbar spine. IMPRESSION: Sigmoid diverticulosis, without evidence of diverticulitis. Additional ancillary findings as above Electronically Signed   By: Julian Hy M.D.   On: 06/10/2022 02:57   DG Chest Port 1 View  Result Date: 06/10/2022 CLINICAL DATA:  Shortness of breath EXAM: PORTABLE CHEST 1 VIEW COMPARISON:  06/08/2022 FINDINGS: Mild patchy opacity at the medial right lung base. Left lung is clear. No pleural effusion or pneumothorax. Cardiomegaly.  Thoracic aortic atherosclerosis. Visualized osseous structures are within normal limits. IMPRESSION: Mild patchy opacity at the medial right lung base, raising the possibility of mild infection/pneumonia. Electronically Signed   By: Julian Hy M.D.   On: 06/10/2022 02:36   DG Chest 2 View  Result Date: 06/08/2022 CLINICAL DATA:  Shortness of breath EXAM: CHEST - 2 VIEW COMPARISON:  Previous studies including the examination of 06/20/2012 FINDINGS: Transverse diameter of heart is increased. There are no signs of pulmonary edema or new focal infiltrates. There is no significant pleural effusion or pneumothorax. IMPRESSION: Cardiomegaly. There are no signs of pulmonary edema or focal pulmonary consolidation. Electronically Signed   By: Elmer Picker M.D.   On: 06/08/2022 15:47     Microbiology: Results for orders placed or performed during the hospital encounter of 06/10/22  Culture, blood (routine x 2)     Status: None   Collection Time: 06/10/22  2:04 AM   Specimen: BLOOD RIGHT ARM  Result Value Ref Range Status   Specimen Description BLOOD RIGHT ARM  Final   Special Requests   Final    BOTTLES DRAWN AEROBIC AND ANAEROBIC Blood Culture adequate volume   Culture   Final    NO GROWTH 5 DAYS Performed at [facility], [address], [place], [place] [address]    Report Status 06/15/2022 FINAL  Final  Culture, blood (routine x 2)     Status: None   Collection Time: 06/10/22  2:04 AM   Specimen: BLOOD RIGHT ARM  Result Value Ref Range Status   Specimen Description BLOOD RIGHT ARM  Final   Special Requests   Final    BOTTLES DRAWN AEROBIC AND ANAEROBIC Blood Culture results may not be optimal due to an inadequate volume of blood received in culture bottles   Culture   Final    NO GROWTH 5 DAYS Performed at [facility], [address], [place], [place] [address]    Report Status 06/15/2022 FINAL  Final  Urine Culture     Status: None   Collection Time: 06/13/22  5:47 AM   Specimen: Urine, Clean Catch  Result Value Ref Range Status   Specimen Description   Final    URINE, CLEAN CATCH Performed at [facility], [address] [place], [place] [address]    Special Requests   Final    NONE Performed at [facility], [address] [place], [place] [address]    Culture   Final    NO GROWTH Performed at [facility], [place] [address], [place], [place] [address]    Report Status 06/14/2022 FINAL  Final    Labs: CBC: Recent Labs  Lab 06/17/22 0603 06/18/22 0451 06/19/22 0543 06/20/22 0407 06/21/22 0705  WBC 9.1 8.6 8.3 9.4 9.4  HGB 8.8* 8.6* 9.0* 8.8* 8.7*  HCT 27.7* 26.7* 27.1* 27.4* 27.2*  MCV 87.7 86.1 86.0 87.3 88.9  PLT 160 177 180 194 179   Basic  Metabolic Panel: Recent Labs  Lab  06/15/22 0658 06/16/22 0734 06/17/22 0603 06/18/22 0451 06/19/22 0543 06/20/22 0407 06/21/22 0705  NA 134*   < > 134* 136 138 140 140  K 4.8   < > 4.7 4.2 3.7 3.5 4.0  CL 107   < > 107 106 106 104 107  CO2 15*   < > 13* 15* 21* 26 25  GLUCOSE 99   < > 99 100* 113* 107* 110*  BUN 73*   < > 85* 92* 91* 81* 74*  CREATININE 4.81*   < > 4.35* 3.58* 2.93* 2.59* 2.42*  CALCIUM 7.5*   < > 8.2* 8.4* 8.9 8.7* 9.0  MG 1.9   < > 2.0 2.0 1.8 1.8 1.7  PHOS 6.8*  --   --   --   --   --   --    < > = values in this interval not displayed.   Liver Function Tests: No results for input(s): "AST", "ALT", "ALKPHOS", "BILITOT", "PROT", "ALBUMIN" in the last 168 hours. CBG: Recent Labs  Lab 06/20/22 1150 06/20/22 1743 06/20/22 2155 06/21/22 0908 06/21/22 1202  GLUCAP 135* 101* 90 134* 88    Discharge time spent: greater than 30 minutes.  Signed: Sharen Hones, MD [facility] 06/21/2022

## 2022-06-21 NOTE — Progress Notes (Signed)
[facility]  ROUNDING NOTE   Subjective:   [name] is a 81 year old female with medical concerns including GERD, diabetes, hypertension, dyslipidemia, hypothyroidism, and chronic kidney disease stage IV.  Patient presents to the emergency department complaining of shortness of breath and has been admitted for Hypocalcemia [E83.51] Generalized weakness [R53.1] Nausea vomiting and diarrhea [R11.2, R19.7] Pneumonia due to COVID-19 virus [U07.1, J12.82] COVID-19 [U07.1]  Patient is known to our practice and is followed outpatient by Dr. Holley Raring.    Patient currently sitting up in bed, breakfast at bedside.  No family at bedside at this time.  States she feels well, improved respiratory status.  Remains on 3 L nasal cannula.  No lower extremity edema  Creatinine 2.42   Objective:  Vital signs in last 24 hours:  Temp:  [97.9 F (36.6 C)-98.7 F (37.1 C)] 98.7 F (37.1 C) (09/20 0915) Pulse Rate:  [60-64] 64 (09/20 0915) Resp:  [16-18] 16 (09/20 0915) BP: (145-163)/(50-54) 150/53 (09/20 0915) SpO2:  [97 %-99 %] 99 % (09/20 0915)  Weight change:  Filed Weights   06/10/22 0141  Weight: 88.5 kg    Intake/Output: I/O last 3 completed shifts: In: 120 [P.O.:120] Out: 1400 [Urine:1400]   Intake/Output this shift:  No intake/output data recorded.  Physical Exam: General: NAD  Head: Normocephalic, atraumatic. Moist oral mucosal membranes  Eyes: Anicteric  Lungs:  Diminished in bases  [place] O2  Heart: Regular rate and rhythm  Abdomen:  Soft, nontender, obese  Extremities: No peripheral edema.  Neurologic: Nonfocal, moving all four extremities  Skin: No lesions, dry lower extremities  Access: Lt AVF (maturing)    Basic Metabolic Panel: Recent Labs  Lab 06/15/22 0658 06/16/22 0734 06/17/22 0603 06/18/22 0451 06/19/22 0543 06/20/22 0407 06/21/22 0705  NA 134*   < > 134* 136 138 140 140  K 4.8   < > 4.7 4.2 3.7 3.5 4.0  CL 107   < > 107 106 106 104 107   CO2 15*   < > 13* 15* 21* 26 25  GLUCOSE 99   < > 99 100* 113* 107* 110*  BUN 73*   < > 85* 92* 91* 81* 74*  CREATININE 4.81*   < > 4.35* 3.58* 2.93* 2.59* 2.42*  CALCIUM 7.5*   < > 8.2* 8.4* 8.9 8.7* 9.0  MG 1.9   < > 2.0 2.0 1.8 1.8 1.7  PHOS 6.8*  --   --   --   --   --   --    < > = values in this interval not displayed.     Liver Function Tests: No results for input(s): "AST", "ALT", "ALKPHOS", "BILITOT", "PROT", "ALBUMIN" in the last 168 hours.  No results for input(s): "LIPASE", "AMYLASE" in the last 168 hours. No results for input(s): "AMMONIA" in the last 168 hours.  CBC: Recent Labs  Lab 06/17/22 0603 06/18/22 0451 06/19/22 0543 06/20/22 0407 06/21/22 0705  WBC 9.1 8.6 8.3 9.4 9.4  HGB 8.8* 8.6* 9.0* 8.8* 8.7*  HCT 27.7* 26.7* 27.1* 27.4* 27.2*  MCV 87.7 86.1 86.0 87.3 88.9  PLT 160 177 180 194 179     Cardiac Enzymes: No results for input(s): "CKTOTAL", "CKMB", "CKMBINDEX", "TROPONINI" in the last 168 hours.  BNP: Invalid input(s): "POCBNP"  CBG: Recent Labs  Lab 06/20/22 1150 06/20/22 1743 06/20/22 2155 06/21/22 0908 06/21/22 1202  GLUCAP 135* 101* 90 134* 88     Microbiology: Results for orders placed or performed during the  hospital encounter of 06/10/22  Culture, blood (routine x 2)     Status: None   Collection Time: 06/10/22  2:04 AM   Specimen: BLOOD RIGHT ARM  Result Value Ref Range Status   Specimen Description BLOOD RIGHT ARM  Final   Special Requests   Final    BOTTLES DRAWN AEROBIC AND ANAEROBIC Blood Culture adequate volume   Culture   Final    NO GROWTH 5 DAYS Performed at [facility], [address], [place], [place] [address]    Report Status 06/15/2022 FINAL  Final  Culture, blood (routine x 2)     Status: None   Collection Time: 06/10/22  2:04 AM   Specimen: BLOOD RIGHT ARM  Result Value Ref Range Status   Specimen Description BLOOD RIGHT ARM  Final   Special Requests   Final    BOTTLES DRAWN AEROBIC AND  ANAEROBIC Blood Culture results may not be optimal due to an inadequate volume of blood received in culture bottles   Culture   Final    NO GROWTH 5 DAYS Performed at [facility], [address], [place], [place] [address]    Report Status 06/15/2022 FINAL  Final  Urine Culture     Status: None   Collection Time: 06/13/22  5:47 AM   Specimen: Urine, Clean Catch  Result Value Ref Range Status   Specimen Description   Final    URINE, CLEAN CATCH Performed at [facility], [address] [place], [place] [address]    Special Requests   Final    NONE Performed at [facility], [address] [place], [place] [address]    Culture   Final    NO GROWTH Performed at [facility], [place] [address] [place], [place] [address]    Report Status 06/14/2022 FINAL  Final    Coagulation Studies: No results for input(s): "LABPROT", "INR" in the last 72 hours.  Urinalysis: No results for input(s): "COLORURINE", "LABSPEC", "PHURINE", "GLUCOSEU", "HGBUR", "BILIRUBINUR", "KETONESUR", "PROTEINUR", "UROBILINOGEN", "NITRITE", "LEUKOCYTESUR" in the last 72 hours.  Invalid input(s): "APPERANCEUR"     Imaging: ECHOCARDIOGRAM COMPLETE  Result Date: 06/20/2022    ECHOCARDIOGRAM REPORT   Patient Name:   [facility] Date of Exam: 06/19/2022 Medical Rec #:  [id number]     Height:       62.0 in Accession #:    [id number]    Weight:       195.0 lb Date of Birth:  11-Jan-1941     BSA:          1.891 m Patient Age:    77 years      BP:           184/67 mmHg Patient Gender: F             HR:           63 bpm. Exam Location:  [facility] Procedure: 2D Echo, Cardiac Doppler and Color Doppler Indications:     Cardiomyopathy-unspecified I42.9  History:         Patient has no prior history of Echocardiogram examinations.                  CHF; Risk Factors:Diabetes, Hypertension and Dyslipidemia.  Sonographer:     Sherrie Sport Referring Phys:  [id number] Jeanella Flattery AMIN Diagnosing Phys: Serafina Royals MD IMPRESSIONS  1. Left ventricular ejection fraction, by estimation, is 60 to 65%. The left ventricle has normal function.  The left ventricle has no regional wall motion abnormalities. Left ventricular diastolic parameters were normal.  2. Right ventricular systolic function is normal. The right ventricular size is normal.  3. Left atrial size was mildly dilated.  4. The mitral valve is normal in structure. Mild to moderate mitral valve regurgitation.  5. The aortic valve is normal in structure. Aortic valve regurgitation is not visualized. FINDINGS  Left Ventricle: Left ventricular ejection fraction, by estimation, is 60 to 65%. The left ventricle has normal function. The left ventricle has no regional wall motion abnormalities. The left ventricular internal cavity size was normal in size. There is  no left ventricular hypertrophy. Left ventricular diastolic parameters were normal. Right Ventricle: The right ventricular size is normal. No increase in right ventricular wall thickness. Right ventricular systolic function is normal. Left Atrium: Left atrial size was mildly dilated. Right Atrium: Right atrial size was normal in size. Pericardium: There is no evidence of pericardial effusion. Mitral Valve: The mitral valve is normal in structure. Mild to moderate mitral valve regurgitation. Tricuspid Valve: The tricuspid valve is normal in structure. Tricuspid valve regurgitation is mild. Aortic Valve: The aortic valve is normal in structure. Aortic valve regurgitation is not visualized. Aortic valve mean gradient measures 6.0 mmHg. Aortic valve peak gradient measures 10.5 mmHg. Aortic valve area, by VTI measures 2.49 cm. Pulmonic Valve: The pulmonic valve was normal in structure. Pulmonic valve regurgitation is trivial. Aorta: The aortic root and ascending aorta are structurally normal, with no evidence of dilitation. IAS/Shunts: No atrial level shunt detected by color flow Doppler.  LEFT VENTRICLE PLAX 2D  LVIDd:         4.90 cm   Diastology LVIDs:         3.40 cm   LV e' medial:    7.40 cm/s LV PW:         1.30 cm   LV E/e' medial:  13.3 LV IVS:        0.90 cm   LV e' lateral:   8.70 cm/s LVOT diam:     2.00 cm   LV E/e' lateral: 11.3 LV SV:         86 LV SV Index:   46 LVOT Area:     3.14 cm  RIGHT VENTRICLE RV Basal diam:  3.60 cm RV Mid diam:    2.80 cm RV S prime:     20.10 cm/s TAPSE (M-mode): 2.5 cm LEFT ATRIUM             Index        RIGHT ATRIUM           Index LA diam:        5.30 cm 2.80 cm/m   RA Area:     17.10 cm LA Vol (A2C):   84.9 ml 44.89 ml/m  RA Volume:   47.10 ml  24.90 ml/m LA Vol (A4C):   68.4 ml 36.17 ml/m LA Biplane Vol: 76.6 ml 40.50 ml/m  AORTIC VALVE AV Area (Vmax):    2.44 cm AV Area (Vmean):   2.49 cm AV Area (VTI):     2.49 cm AV Vmax:           162.00 cm/s AV Vmean:          107.000 cm/s AV VTI:            0.346 m AV Peak Grad:      10.5 mmHg AV Mean Grad:      6.0  mmHg LVOT Vmax:         126.00 cm/s LVOT Vmean:        84.900 cm/s LVOT VTI:          0.274 m LVOT/AV VTI ratio: 0.79  AORTA Ao Root diam: 3.20 cm MITRAL VALVE               TRICUSPID VALVE MV Area (PHT): 4.29 cm    TR Peak grad:   34.6 mmHg MV Decel Time: 177 msec    TR Vmax:        294.00 cm/s MV E velocity: 98.50 cm/s MV A velocity: 92.20 cm/s  SHUNTS MV E/A ratio:  1.07        Systemic VTI:  0.27 m                            Systemic Diam: 2.00 cm Serafina Royals MD Electronically signed by Serafina Royals MD Signature Date/Time: 06/20/2022/12:22:22 PM    Final      Medications:    promethazine (PHENERGAN) injection (IM or IVPB) Stopped (06/11/22 1113)    amLODipine  5 mg Oral Daily   vitamin C  500 mg Oral Daily   azelastine  1 spray Each Nare BID   calcitRIOL  0.5 mcg Oral Daily   diclofenac Sodium  2 g Topical QID   heparin  5,000 Units Subcutaneous Q8H   hydrALAZINE  100 mg Oral BID   insulin aspart  0-9 Units Subcutaneous TID WC   Ipratropium-Albuterol  1 puff Inhalation Q6H   levothyroxine   150 mcg Oral Q0600   lidocaine  1 patch Transdermal Q12H   loratadine  10 mg Oral Daily   metoprolol tartrate  50 mg Oral BID   montelukast  10 mg Oral QHS   multivitamin with minerals  1 tablet Oral Daily   pantoprazole  40 mg Oral QAC breakfast   sertraline  100 mg Oral Daily   zinc sulfate  220 mg Oral Daily   acetaminophen, albuterol, chlorpheniramine-HYDROcodone, guaiFENesin-dextromethorphan, hydrALAZINE, HYDROcodone-acetaminophen, loperamide, metoprolol tartrate, polyethylene glycol, promethazine (PHENERGAN) injection (IM or IVPB), senna-docusate, traZODone  Assessment/ Plan:  [name] is a 81 y.o.  female with medical concerns including GERD, diabetes, hypertension, dyslipidemia, hypothyroidism, and chronic kidney disease stage IV.  Patient presents to the emergency department complaining of shortness of breath and has been admitted for Hypocalcemia [E83.51] Generalized weakness [R53.1] Nausea vomiting and diarrhea [R11.2, R19.7] Pneumonia due to COVID-19 virus [U07.1, J12.82] COVID-19 [U07.1]   Acute Kidney Injury on chronic kidney disease stage 4 with baseline creatinine 2.7 and GFR of 17 on 05/09/2022.  Acute kidney injury secondary to hypovolemia and severe ATN secondary to concurrent infection. Chronic kidney disease is secondary to advanced age, hypertension and diabetes CT abdomen pelvis shows multiple renal cysts in right kidney, left kidney within normal limits with no obstruction. Serum creatinine remains critically elevated.   Renal function continues to improve today with adequate urine output recorded.  Respiratory status also improving.  We will continue to monitor during this admission and patient will need hospital follow-up in our office at discharge.    Lab Results  Component Value Date   CREATININE 2.42 (H) 06/21/2022   CREATININE 2.59 (H) 06/20/2022   CREATININE 2.93 (H) 06/19/2022   No intake or output data in the 24 hours ending 06/21/22  1217   2. Hypocalcemia, severe.  Calcium 5.7 on admission.   Continue  calcitriol daily. Calcium corrected to 9.0 today.  3. Anemia of chronic kidney disease Lab Results  Component Value Date   HGB 8.7 (L) 06/21/2022    Hemoglobin remains decreased but stable.  We will continue to monitor.  4. Hypertension with chronic kidney disease. Home regimen includes amlodipine, hydralazine, losartan, metoprolol, spironolactone, and torsemide.  Scheduled doses of diuretics remaining held.  Patient received additional dose of IV furosemide 40 mg yesterday.  5. Diabetes mellitus type II with chronic kidney disease/renal manifestations: noninsulin dependent.Most recent hemoglobin A1c is 6.0% on June 14, 2022.  Primary team to manage sliding scale insulin.  6.  COVID-19 pneumonia.  Patient completed ampicillin-sulbactam and doxycycline, per primary team.   Remains on 3L [place] and receiving supportive care.   LOS: [place] 9/20/202312:17 PM

## 2022-06-29 ENCOUNTER — Ambulatory Visit (INDEPENDENT_AMBULATORY_CARE_PROVIDER_SITE_OTHER): Payer: Medicare Other | Admitting: Vascular Surgery

## 2022-06-29 ENCOUNTER — Encounter (INDEPENDENT_AMBULATORY_CARE_PROVIDER_SITE_OTHER): Payer: Medicare Other

## 2022-07-07 ENCOUNTER — Other Ambulatory Visit (INDEPENDENT_AMBULATORY_CARE_PROVIDER_SITE_OTHER): Payer: Self-pay | Admitting: Vascular Surgery

## 2022-07-07 DIAGNOSIS — Z9889 Other specified postprocedural states: Secondary | ICD-10-CM

## 2022-07-07 DIAGNOSIS — N186 End stage renal disease: Secondary | ICD-10-CM

## 2022-07-10 ENCOUNTER — Encounter (INDEPENDENT_AMBULATORY_CARE_PROVIDER_SITE_OTHER): Payer: Self-pay | Admitting: Vascular Surgery

## 2022-07-10 ENCOUNTER — Ambulatory Visit (INDEPENDENT_AMBULATORY_CARE_PROVIDER_SITE_OTHER): Payer: Medicare Other | Admitting: Vascular Surgery

## 2022-07-10 ENCOUNTER — Ambulatory Visit (INDEPENDENT_AMBULATORY_CARE_PROVIDER_SITE_OTHER): Payer: Medicare Other

## 2022-07-10 VITALS — BP 153/68 | HR 71 | Resp 16 | Wt 181.8 lb

## 2022-07-10 DIAGNOSIS — N184 Chronic kidney disease, stage 4 (severe): Secondary | ICD-10-CM

## 2022-07-10 DIAGNOSIS — N186 End stage renal disease: Secondary | ICD-10-CM | POA: Diagnosis not present

## 2022-07-10 DIAGNOSIS — Z9889 Other specified postprocedural states: Secondary | ICD-10-CM

## 2022-07-15 NOTE — Progress Notes (Signed)
Patient ID: [name], female   DOB: 1941/05/12, 81 y.o.   MRN: [id number]  Chief Complaint  Patient presents with   Routine Post Op    [facility] 4 week AVF creation    HPI [name] is a 81 y.o. female.    No pain  No new c/o   Past Medical History:  Diagnosis Date   Actinic keratosis    Albuminuria    Anemia    Arthritis    Asthma    Basal cell carcinoma 04/12/2017   Above right lateral brow. Nodulocystic type. EDC   Cancer ([place])    skin   Cataract cortical, senile    CHF (congestive heart failure) ([place])    Diabetes mellitus without complication (HCC)    GERD (gastroesophageal reflux disease)    Hemorrhoids    History of kidney stones    Hyperlipidemia    Hypertension    Hypothyroidism    Lyme disease    No kidney function    OSA (obstructive sleep apnea)    Osteoporosis    Osteoporosis    Reflux esophagitis    Steatohepatitis    Steatohepatitis     Past Surgical History:  Procedure Laterality Date   ABDOMINAL HYSTERECTOMY     APPENDECTOMY     AV FISTULA PLACEMENT Left 05/19/2022   Procedure: ARTERIOVENOUS (AV) FISTULA CREATION ( BRACHIAL CEPHALIC );  Surgeon: Katha Cabal, MD;  Location: [facility] ORS;  Service: Vascular;  Laterality: Left;   [place]  08/13/2014   [facility]. no significant CAD, normal LVEDP.    CATARACT EXTRACTION     CHOLECYSTECTOMY     COLONOSCOPY     COLONOSCOPY WITH PROPOFOL N/A 12/07/2016   Procedure: COLONOSCOPY WITH PROPOFOL;  Surgeon: Lollie Sails, MD;  Location: [facility] ENDOSCOPY;  Service: Endoscopy;  Laterality: N/A;   ESOPHAGOGASTRODUODENOSCOPY     ESOPHAGOGASTRODUODENOSCOPY (EGD) WITH PROPOFOL N/A 12/07/2016   Procedure: ESOPHAGOGASTRODUODENOSCOPY (EGD) WITH PROPOFOL;  Surgeon: Lollie Sails, MD;  Location: [facility] ENDOSCOPY;  Service: Endoscopy;  Laterality: N/A;   ESOPHAGOGASTRODUODENOSCOPY (EGD) WITH PROPOFOL N/A 01/07/2018   Procedure: ESOPHAGOGASTRODUODENOSCOPY  (EGD) WITH PROPOFOL;  Surgeon: Lollie Sails, MD;  Location: [facility] ENDOSCOPY;  Service: Endoscopy;  Laterality: N/A;   EYE SURGERY     HEMORRHOID SURGERY     PARTIAL HYSTERECTOMY     THYROIDECTOMY N/A 08/25/2019   Procedure: THYROIDECTOMY EXTRACTION OF SUBTOTAL COMPONENT; PARATHYROID AUTOTRANSPLANT X1;  Surgeon: Fredirick Maudlin, MD;  Location: [facility] ORS;  Service: General;  Laterality: N/A;  With Nerve Monitoring(RLN)   TONSILLECTOMY        Allergies  Allergen Reactions   Ace Inhibitors     Other reaction(s): Unknown   Eggs Or Egg-Derived Products Diarrhea   Other     Other reaction(s): Other (See Comments) Eggs   Prednisone     Other reaction(s): Other (See Comments) joint pain   Risedronate     Other reaction(s): Other (See Comments)   Sulfa Antibiotics     Other reaction(s): Other (See Comments)   Sulfasalazine Other (See Comments)    Current Outpatient Medications  Medication Sig Dispense Refill   acetaminophen (TYLENOL) 500 MG tablet Take 1,000 mg by mouth every 6 (six) hours as needed (pain).     albuterol (PROVENTIL HFA;VENTOLIN HFA) 108 (90 BASE) MCG/ACT inhaler Inhale 1-2 puffs into the lungs every 4 (four) hours as needed for wheezing or shortness of breath.  amLODipine (NORVASC) 5 MG tablet Take 5 mg by mouth daily.     azelastine (ASTELIN) 0.1 % nasal spray ONE SPRAY INTO BOTH NOSTRILS TWICE DAILY     calcitRIOL (ROCALTROL) 0.5 MCG capsule Take 1 capsule (0.5 mcg total) by mouth daily. 30 capsule 0   esomeprazole (NEXIUM) 40 MG packet Take 40 mg by mouth daily before breakfast.     Fluticasone-Salmeterol (ADVAIR) 250-50 MCG/DOSE AEPB Inhale 1 puff into the lungs 2 (two) times daily as needed (asthma).      hydrALAZINE (APRESOLINE) 100 MG tablet Take 100 mg by mouth 2 (two) times daily.     HYDROcodone-acetaminophen (NORCO) 5-325 MG tablet Take 1 tablet by mouth every 6 (six) hours as needed for moderate pain. 26 tablet 0   Ipratropium-Albuterol (COMBIVENT)  20-100 MCG/ACT AERS respimat Inhale into the lungs.     levothyroxine (SYNTHROID) 137 MCG tablet Take 1 tablet (137 mcg total) by mouth daily at 6 (six) AM. 30 tablet 1   loratadine (CLARITIN) 10 MG tablet Take 10 mg by mouth daily.     metoprolol tartrate (LOPRESSOR) 50 MG tablet Take 50 mg by mouth 2 (two) times daily.      montelukast (SINGULAIR) 10 MG tablet Take 10 mg by mouth at bedtime.     Multiple Minerals-Vitamins (CALCIUM & VIT D3 BONE HEALTH PO) Take 1 tablet by mouth daily. 600 mg/ 25 mg     Potassium Bicarb-Citric Acid 20 MEQ TBEF Take 1 tablet by mouth daily.     rosuvastatin (CRESTOR) 40 MG tablet Take 40 mg by mouth daily.     sertraline (ZOLOFT) 100 MG tablet Take 100 mg by mouth daily.     spironolactone (ALDACTONE) 25 MG tablet Take 25 mg by mouth daily.     torsemide (DEMADEX) 10 MG tablet Take 10 mg by mouth daily. Take 1-2 tablets daily     LAGEVRIO 200 MG CAPS capsule Take 4 capsules by mouth 2 (two) times daily. (Patient not taking: Reported on 07/10/2022)     lidocaine (LIDODERM) 5 % Place 1 patch onto the skin every 12 (twelve) hours. Remove & Discard patch within 12 hours or as directed by MD (Patient not taking: Reported on 07/10/2022) 15 patch 0   No current facility-administered medications for this visit.        Physical Exam BP (!) 153/68 (BP Location: Right Arm)   Pulse 71   Resp 16   Wt 181 lb 12.8 oz (82.5 kg)   BMI 33.25 kg/m  Gen:  WD/WN, NAD Skin: incision C/D/I;  fistula with good thrill and good bruit     Assessment/Plan: 1. ESRD (end stage renal disease) ([place]) Recommend:  The patient is doing well and currently has adequate dialysis access. The patient's dialysis center is not reporting any access issues. Flow pattern is stable when compared to the prior ultrasound.  The patient should have a duplex ultrasound of the dialysis access in 4 months. The patient will follow-up with me in the office after each ultrasound    - VAS [place] (AVF, AVG)  2. S/P arteriovenous (AV) fistula creation See #1 - VAS US DUPLEX DIALYSIS ACCESS (AVF, AVG)  3. CKD (chronic kidney disease), stage IV [facility]) See #1      [name] 07/15/2022, 3:15 PM   This note was created with Dragon medical transcription system.  Any errors from dictation are unintentional.

## 2022-07-31 ENCOUNTER — Encounter (INDEPENDENT_AMBULATORY_CARE_PROVIDER_SITE_OTHER): Payer: Self-pay

## 2022-08-01 ENCOUNTER — Other Ambulatory Visit: Payer: Self-pay | Admitting: Infectious Diseases

## 2022-08-01 DIAGNOSIS — W1800XA Striking against unspecified object with subsequent fall, initial encounter: Secondary | ICD-10-CM

## 2022-08-01 DIAGNOSIS — S0990XA Unspecified injury of head, initial encounter: Secondary | ICD-10-CM

## 2022-08-01 DIAGNOSIS — R42 Dizziness and giddiness: Secondary | ICD-10-CM

## 2022-08-02 ENCOUNTER — Encounter: Payer: Self-pay | Admitting: Internal Medicine

## 2022-08-02 ENCOUNTER — Inpatient Hospital Stay: Payer: Medicare Other

## 2022-08-02 ENCOUNTER — Other Ambulatory Visit: Payer: Self-pay

## 2022-08-02 ENCOUNTER — Inpatient Hospital Stay
Admission: EM | Admit: 2022-08-02 | Discharge: 2022-08-05 | DRG: 682 | Disposition: A | Discharge status: Home Health | Payer: Medicare Other | Attending: Internal Medicine | Admitting: Internal Medicine

## 2022-08-02 DIAGNOSIS — E785 Hyperlipidemia, unspecified: Secondary | ICD-10-CM | POA: Diagnosis present

## 2022-08-02 DIAGNOSIS — T501X5A Adverse effect of loop [high-ceiling] diuretics, initial encounter: Secondary | ICD-10-CM | POA: Diagnosis present

## 2022-08-02 DIAGNOSIS — J4489 Other specified chronic obstructive pulmonary disease: Secondary | ICD-10-CM | POA: Diagnosis present

## 2022-08-02 DIAGNOSIS — N185 Chronic kidney disease, stage 5: Secondary | ICD-10-CM | POA: Diagnosis present

## 2022-08-02 DIAGNOSIS — Z8616 Personal history of COVID-19: Secondary | ICD-10-CM

## 2022-08-02 DIAGNOSIS — F32A Depression, unspecified: Secondary | ICD-10-CM | POA: Diagnosis present

## 2022-08-02 DIAGNOSIS — I5032 Chronic diastolic (congestive) heart failure: Secondary | ICD-10-CM | POA: Diagnosis present

## 2022-08-02 DIAGNOSIS — Z9089 Acquired absence of other organs: Secondary | ICD-10-CM

## 2022-08-02 DIAGNOSIS — Z85828 Personal history of other malignant neoplasm of skin: Secondary | ICD-10-CM | POA: Diagnosis not present

## 2022-08-02 DIAGNOSIS — G4733 Obstructive sleep apnea (adult) (pediatric): Secondary | ICD-10-CM | POA: Diagnosis present

## 2022-08-02 DIAGNOSIS — T500X5A Adverse effect of mineralocorticoids and their antagonists, initial encounter: Secondary | ICD-10-CM | POA: Diagnosis present

## 2022-08-02 DIAGNOSIS — I130 Hypertensive heart and chronic kidney disease with heart failure and stage 1 through stage 4 chronic kidney disease, or unspecified chronic kidney disease: Secondary | ICD-10-CM | POA: Diagnosis present

## 2022-08-02 DIAGNOSIS — K7581 Nonalcoholic steatohepatitis (NASH): Secondary | ICD-10-CM | POA: Diagnosis present

## 2022-08-02 DIAGNOSIS — N184 Chronic kidney disease, stage 4 (severe): Secondary | ICD-10-CM | POA: Diagnosis present

## 2022-08-02 DIAGNOSIS — Z9181 History of falling: Secondary | ICD-10-CM

## 2022-08-02 DIAGNOSIS — R42 Dizziness and giddiness: Secondary | ICD-10-CM

## 2022-08-02 DIAGNOSIS — Z8249 Family history of ischemic heart disease and other diseases of the circulatory system: Secondary | ICD-10-CM

## 2022-08-02 DIAGNOSIS — E861 Hypovolemia: Secondary | ICD-10-CM | POA: Diagnosis present

## 2022-08-02 DIAGNOSIS — N179 Acute kidney failure, unspecified: Principal | ICD-10-CM | POA: Diagnosis present

## 2022-08-02 DIAGNOSIS — E039 Hypothyroidism, unspecified: Secondary | ICD-10-CM | POA: Diagnosis present

## 2022-08-02 DIAGNOSIS — Z79890 Hormone replacement therapy (postmenopausal): Secondary | ICD-10-CM

## 2022-08-02 DIAGNOSIS — E1122 Type 2 diabetes mellitus with diabetic chronic kidney disease: Secondary | ICD-10-CM | POA: Diagnosis present

## 2022-08-02 DIAGNOSIS — Z91012 Allergy to eggs: Secondary | ICD-10-CM

## 2022-08-02 DIAGNOSIS — I10 Essential (primary) hypertension: Secondary | ICD-10-CM | POA: Diagnosis present

## 2022-08-02 DIAGNOSIS — Y929 Unspecified place or not applicable: Secondary | ICD-10-CM

## 2022-08-02 DIAGNOSIS — G9341 Metabolic encephalopathy: Secondary | ICD-10-CM | POA: Diagnosis present

## 2022-08-02 DIAGNOSIS — N2581 Secondary hyperparathyroidism of renal origin: Secondary | ICD-10-CM | POA: Diagnosis present

## 2022-08-02 DIAGNOSIS — E890 Postprocedural hypothyroidism: Secondary | ICD-10-CM

## 2022-08-02 DIAGNOSIS — W19XXXA Unspecified fall, initial encounter: Secondary | ICD-10-CM | POA: Diagnosis present

## 2022-08-02 DIAGNOSIS — Z79899 Other long term (current) drug therapy: Secondary | ICD-10-CM

## 2022-08-02 DIAGNOSIS — E876 Hypokalemia: Secondary | ICD-10-CM | POA: Diagnosis present

## 2022-08-02 DIAGNOSIS — I2510 Atherosclerotic heart disease of native coronary artery without angina pectoris: Secondary | ICD-10-CM | POA: Diagnosis present

## 2022-08-02 DIAGNOSIS — Z882 Allergy status to sulfonamides status: Secondary | ICD-10-CM

## 2022-08-02 DIAGNOSIS — K529 Noninfective gastroenteritis and colitis, unspecified: Secondary | ICD-10-CM | POA: Diagnosis present

## 2022-08-02 DIAGNOSIS — J42 Unspecified chronic bronchitis: Secondary | ICD-10-CM | POA: Diagnosis present

## 2022-08-02 DIAGNOSIS — K219 Gastro-esophageal reflux disease without esophagitis: Secondary | ICD-10-CM | POA: Diagnosis present

## 2022-08-02 DIAGNOSIS — Z9849 Cataract extraction status, unspecified eye: Secondary | ICD-10-CM

## 2022-08-02 DIAGNOSIS — Z888 Allergy status to other drugs, medicaments and biological substances status: Secondary | ICD-10-CM

## 2022-08-02 DIAGNOSIS — E872 Acidosis, unspecified: Secondary | ICD-10-CM | POA: Diagnosis present

## 2022-08-02 DIAGNOSIS — D631 Anemia in chronic kidney disease: Secondary | ICD-10-CM | POA: Diagnosis present

## 2022-08-02 DIAGNOSIS — N19 Unspecified kidney failure: Secondary | ICD-10-CM | POA: Insufficient documentation

## 2022-08-02 DIAGNOSIS — I5033 Acute on chronic diastolic (congestive) heart failure: Secondary | ICD-10-CM | POA: Diagnosis present

## 2022-08-02 DIAGNOSIS — R296 Repeated falls: Secondary | ICD-10-CM | POA: Diagnosis present

## 2022-08-02 DIAGNOSIS — R1110 Vomiting, unspecified: Secondary | ICD-10-CM | POA: Diagnosis present

## 2022-08-02 LAB — BASIC METABOLIC PANEL
Anion gap: 12 (ref 5–15)
Anion gap: 11 (ref 5–15)
BUN: 90 mg/dL — ABNORMAL HIGH (ref 8–23)
BUN: 82 mg/dL — ABNORMAL HIGH (ref 8–23)
CO2: 12 mmol/L — ABNORMAL LOW (ref 22–32)
CO2: 16 mmol/L — ABNORMAL LOW (ref 22–32)
Calcium: 5.5 mg/dL — CL (ref 8.9–10.3)
Calcium: 6 mg/dL — CL (ref 8.9–10.3)
Chloride: 113 mmol/L — ABNORMAL HIGH (ref 98–111)
Chloride: 112 mmol/L — ABNORMAL HIGH (ref 98–111)
Creatinine, Ser: 3.47 mg/dL — ABNORMAL HIGH (ref 0.44–1.00)
Creatinine, Ser: 2.98 mg/dL — ABNORMAL HIGH (ref 0.44–1.00)
GFR, Estimated: 13 mL/min — ABNORMAL LOW (ref >60)
GFR, Estimated: 15 mL/min — ABNORMAL LOW (ref >60)
Glucose, Bld: 149 mg/dL — ABNORMAL HIGH (ref 70–99)
Glucose, Bld: 160 mg/dL — ABNORMAL HIGH (ref 70–99)
Potassium: 3.9 mmol/L (ref 3.5–5.1)
Potassium: 3.3 mmol/L — ABNORMAL LOW (ref 3.5–5.1)
Sodium: 137 mmol/L (ref 135–145)
Sodium: 139 mmol/L (ref 135–145)

## 2022-08-02 LAB — GASTROINTESTINAL PANEL BY PCR, STOOL (REPLACES STOOL CULTURE)

## 2022-08-02 LAB — URINALYSIS, ROUTINE W REFLEX MICROSCOPIC
Bilirubin Urine: NEGATIVE
Glucose, UA: NEGATIVE mg/dL
Hgb urine dipstick: NEGATIVE
Ketones, ur: NEGATIVE mg/dL
Nitrite: NEGATIVE
Protein, ur: NEGATIVE mg/dL
Specific Gravity, Urine: 1.006 (ref 1.005–1.030)
pH: 5 (ref 5.0–8.0)

## 2022-08-02 LAB — C DIFFICILE QUICK SCREEN W PCR REFLEX
C Diff antigen: NEGATIVE
C Diff interpretation: NOT DETECTED
C Diff toxin: NEGATIVE

## 2022-08-02 LAB — BLOOD GAS, VENOUS
Acid-base deficit: 13.3 mmol/L — ABNORMAL HIGH (ref 0.0–2.0)
Bicarbonate: 11.9 mmol/L — ABNORMAL LOW (ref 20.0–28.0)
O2 Saturation: 98.4 %
Patient temperature: 37
pCO2, Ven: 26 mmHg — ABNORMAL LOW (ref 44–60)
pH, Ven: 7.27 (ref 7.25–7.43)
pO2, Ven: 97 mmHg — ABNORMAL HIGH (ref 32–45)

## 2022-08-02 LAB — TROPONIN I (HIGH SENSITIVITY)
Troponin I (High Sensitivity): 18 ng/L — ABNORMAL HIGH (ref <18)
Troponin I (High Sensitivity): 17 ng/L (ref <18)

## 2022-08-02 LAB — CBC
HCT: 31.8 % — ABNORMAL LOW (ref 36.0–46.0)
Hemoglobin: 10.7 g/dL — ABNORMAL LOW (ref 12.0–15.0)
MCH: 28.4 pg (ref 26.0–34.0)
MCHC: 33.6 g/dL (ref 30.0–36.0)
MCV: 84.4 fL (ref 80.0–100.0)
Platelets: 193 10*3/uL (ref 150–400)
RBC: 3.77 MIL/uL — ABNORMAL LOW (ref 3.87–5.11)
RDW: 14.3 % (ref 11.5–15.5)
WBC: 8.8 10*3/uL (ref 4.0–10.5)
nRBC: 0 % (ref 0.0–0.2)

## 2022-08-02 LAB — HEPATIC FUNCTION PANEL
ALT: 25 U/L (ref 0–44)
AST: 27 U/L (ref 15–41)
Albumin: 3.7 g/dL (ref 3.5–5.0)
Alkaline Phosphatase: 51 U/L (ref 38–126)
Bilirubin, Direct: 0.1 mg/dL (ref 0.0–0.2)
Indirect Bilirubin: 0.6 mg/dL (ref 0.3–0.9)
Total Bilirubin: 0.7 mg/dL (ref 0.3–1.2)
Total Protein: 7.5 g/dL (ref 6.5–8.1)

## 2022-08-02 LAB — PHOSPHORUS: Phosphorus: 5.9 mg/dL — ABNORMAL HIGH (ref 2.5–4.6)

## 2022-08-02 LAB — MAGNESIUM
Magnesium: <0.5 mg/dL — CL (ref 1.7–2.4)
Magnesium: 1.5 mg/dL — ABNORMAL LOW (ref 1.7–2.4)

## 2022-08-02 MED ORDER — PROCHLORPERAZINE EDISYLATE 10 MG/2ML IJ SOLN
5.0000 mg | Freq: Once | INTRAMUSCULAR | Status: AC
  Administered 2022-08-02: 5 mg via INTRAVENOUS
  Filled 2022-08-02: qty 2

## 2022-08-02 MED ORDER — MAGNESIUM SULFATE 2 GM/50ML IV SOLN
2.0000 g | Freq: Once | INTRAVENOUS | Status: AC
  Administered 2022-08-02: 2 g via INTRAVENOUS
  Filled 2022-08-02: qty 50

## 2022-08-02 MED ORDER — ONDANSETRON HCL 4 MG/2ML IJ SOLN
4.0000 mg | Freq: Four times a day (QID) | INTRAMUSCULAR | Status: DC | PRN
  Administered 2022-08-02 – 2022-08-04 (×2): 4 mg via INTRAVENOUS
  Filled 2022-08-02 (×2): qty 2

## 2022-08-02 MED ORDER — SODIUM BICARBONATE 8.4 % IV SOLN
INTRAVENOUS | Status: AC
  Filled 2022-08-02: qty 1000

## 2022-08-02 MED ORDER — SERTRALINE HCL 50 MG PO TABS
100.0000 mg | ORAL_TABLET | Freq: Every day | ORAL | Status: DC
  Administered 2022-08-02 – 2022-08-05 (×4): 100 mg via ORAL
  Filled 2022-08-02 (×4): qty 2

## 2022-08-02 MED ORDER — AMLODIPINE BESYLATE 5 MG PO TABS
5.0000 mg | ORAL_TABLET | Freq: Every day | ORAL | Status: DC
  Administered 2022-08-02 – 2022-08-05 (×4): 5 mg via ORAL
  Filled 2022-08-02 (×4): qty 1

## 2022-08-02 MED ORDER — ROSUVASTATIN CALCIUM 10 MG PO TABS
40.0000 mg | ORAL_TABLET | Freq: Every day | ORAL | Status: DC
  Administered 2022-08-02 – 2022-08-04 (×3): 40 mg via ORAL
  Filled 2022-08-02 (×3): qty 4
  Filled 2022-08-02: qty 2

## 2022-08-02 MED ORDER — CALCIUM GLUCONATE-NACL 1-0.675 GM/50ML-% IV SOLN
1.0000 g | Freq: Once | INTRAVENOUS | Status: AC
  Administered 2022-08-02: 1000 mg via INTRAVENOUS
  Filled 2022-08-02: qty 50

## 2022-08-02 MED ORDER — ENOXAPARIN SODIUM 30 MG/0.3ML IJ SOSY
30.0000 mg | PREFILLED_SYRINGE | INTRAMUSCULAR | Status: DC
  Administered 2022-08-02 – 2022-08-05 (×4): 30 mg via SUBCUTANEOUS
  Filled 2022-08-02 (×4): qty 0.3

## 2022-08-02 MED ORDER — ALBUTEROL SULFATE (2.5 MG/3ML) 0.083% IN NEBU: 2.5000 mg | INHALATION_SOLUTION | RESPIRATORY_TRACT | Status: DC | PRN

## 2022-08-02 MED ORDER — MOMETASONE FURO-FORMOTEROL FUM 200-5 MCG/ACT IN AERO
2.0000 | INHALATION_SPRAY | Freq: Two times a day (BID) | RESPIRATORY_TRACT | Status: DC
  Administered 2022-08-03 – 2022-08-05 (×4): 2 via RESPIRATORY_TRACT
  Filled 2022-08-02: qty 8.8

## 2022-08-02 MED ORDER — ALBUTEROL SULFATE HFA 108 (90 BASE) MCG/ACT IN AERS: 1.0000 | INHALATION_SPRAY | RESPIRATORY_TRACT | Status: DC | PRN

## 2022-08-02 MED ORDER — CALCITRIOL 0.25 MCG PO CAPS
0.5000 ug | ORAL_CAPSULE | Freq: Every day | ORAL | Status: DC
  Administered 2022-08-02 – 2022-08-04 (×3): 0.5 ug via ORAL
  Filled 2022-08-02 (×3): qty 2

## 2022-08-02 MED ORDER — LACTATED RINGERS IV BOLUS
1000.0000 mL | Freq: Once | INTRAVENOUS | Status: AC
  Administered 2022-08-02: 1000 mL via INTRAVENOUS

## 2022-08-02 MED ORDER — HYDRALAZINE HCL 50 MG PO TABS
100.0000 mg | ORAL_TABLET | Freq: Two times a day (BID) | ORAL | Status: DC
  Administered 2022-08-02 – 2022-08-05 (×7): 100 mg via ORAL
  Filled 2022-08-02 (×7): qty 2

## 2022-08-02 MED ORDER — PANTOPRAZOLE SODIUM 40 MG PO TBEC
40.0000 mg | DELAYED_RELEASE_TABLET | Freq: Every day | ORAL | Status: DC
  Administered 2022-08-02 – 2022-08-05 (×4): 40 mg via ORAL
  Filled 2022-08-02 (×4): qty 1

## 2022-08-02 MED ORDER — ACETAMINOPHEN 650 MG RE SUPP: 650.0000 mg | Freq: Four times a day (QID) | RECTAL | Status: DC | PRN

## 2022-08-02 MED ORDER — ONDANSETRON HCL 4 MG PO TABS: 4.0000 mg | ORAL_TABLET | Freq: Four times a day (QID) | ORAL | Status: DC | PRN

## 2022-08-02 MED ORDER — ACETAMINOPHEN 325 MG PO TABS
650.0000 mg | ORAL_TABLET | Freq: Four times a day (QID) | ORAL | Status: DC | PRN
  Administered 2022-08-02 – 2022-08-05 (×7): 650 mg via ORAL
  Filled 2022-08-02 (×7): qty 2

## 2022-08-02 MED ORDER — ONDANSETRON HCL 4 MG/2ML IJ SOLN
4.0000 mg | Freq: Once | INTRAMUSCULAR | Status: AC
  Administered 2022-08-02: 4 mg via INTRAVENOUS
  Filled 2022-08-02: qty 2

## 2022-08-02 MED ORDER — LEVOTHYROXINE SODIUM 50 MCG PO TABS
150.0000 ug | ORAL_TABLET | Freq: Every day | ORAL | Status: DC
  Administered 2022-08-02 – 2022-08-05 (×4): 150 ug via ORAL
  Filled 2022-08-02 (×2): qty 3
  Filled 2022-08-02 (×2): qty 1

## 2022-08-02 MED ORDER — METOPROLOL TARTRATE 50 MG PO TABS
50.0000 mg | ORAL_TABLET | Freq: Two times a day (BID) | ORAL | Status: DC
  Administered 2022-08-02 – 2022-08-05 (×7): 50 mg via ORAL
  Filled 2022-08-02 (×7): qty 1

## 2022-08-02 NOTE — Assessment & Plan Note (Signed)
Defer to nephrology

## 2022-08-02 NOTE — Assessment & Plan Note (Signed)
Patient fell on 10/30 and hit her head and says since then she feels the room spinning when she looks to the left. No prior history of vertigo Head CT was negative for any acute abnormality -PT is recommending home health

## 2022-08-02 NOTE — ED Notes (Signed)
Assisted up to [facility] and back to bed.  Continues to c/o dizziness and do note unsteady on feet.  Rails up for safety.  Call light in reach.

## 2022-08-02 NOTE — Assessment & Plan Note (Signed)
Continue Synthroid °

## 2022-08-02 NOTE — ED Notes (Signed)
Pt reports chronic diarrhea.  Sts "it has been like this since I had my gallbladder removed."

## 2022-08-02 NOTE — Progress Notes (Signed)
No charge progress note.  [name] is a 81 y.o. female with medical history significant for CKD 4, anemia of CKD, diastolic CHF, HTN, hypothyroidism, COPD, hospitalized from 9/9 to 06/21/2022 with vomiting and diarrhea attributed to [place] at which time she had severe hypocalcemia and hypomagnesemia which were replenished, who presents to the ED with a 2-week history of recurrent vomiting and diarrhea, associated with dizziness that led to a fall and near passing out episode about 3 days ago. She hit her head when she fell and thinks she might have passed out and since the fall had dizziness described as a spinning sensation when turning her head to the left. She also has shortness of breath, typical for her asthma/COPD. Denies chest pain. She feels like her nausea and diarrhea never did completely resolve after her last admission. ED course and data review: BP 156/68, pulse 84 and respirations 26 with O2 sat 98% on room air.  Labs notable for creatinine of 3.47 up from 2.42 a month ago, with bicarb of 12, GFR 13 down from 20 a month ago.  Potassium 3.9.  Calcium 5.5 down from 8.1 on 10/5.  Magnesium less than 0.5 and phosphorus 5.9.  Troponin 18.  Hemoglobin 10.7, up from 8.7 on 9/20. EKG, personally viewed and interpreted showing sinus at 81 with no acute ST-T wave changes.  Patient treated with an LR bolus, calcium gluconate 1 g x 2, magnesium sulfate 2 g and started on a bicarb drip.  11/1: Some improvement to magnesium but remained low at 1.5 and calcium remain low at 6.  Further replacement ordered.  Patient appears alert and oriented x3.  Tolerated breakfast.  Denies any more vomiting or diarrhea.  Patient has history of chronic intermittent diarrhea. C. difficile was negative.  GI pathogen panel pending. CT head with no evidence of acute intracranial injury. Renal functions and bicarb improving. Nephrology is on board. Ordered PT  If continue to improve, can be discharged home tomorrow.

## 2022-08-02 NOTE — Assessment & Plan Note (Signed)
CPAP.  

## 2022-08-02 NOTE — Assessment & Plan Note (Signed)
Hemoglobin stable    Latest Ref Rng & Units 08/02/2022    1:57 AM 06/21/2022    7:05 AM 06/20/2022    4:07 AM  CBC  WBC 4.0 - 10.5 K/uL 8.8  9.4  9.4   Hemoglobin 12.0 - 15.0 g/dL 10.7  8.7  8.8   Hematocrit 36.0 - 46.0 % 31.8  27.2  27.4   Platelets 150 - 400 K/uL 193  179  194

## 2022-08-02 NOTE — ED Provider Notes (Signed)
[facility] Provider Note    Event Date/Time   First MD Initiated Contact with Patient 08/02/22 0151     (approximate)   History   Chief Complaint Weakness and Abnormal Lab (Potassium at PCP 5.6)   HPI  [name] is a 81 y.o. female with past medical history of hypertension, hyperlipidemia, diabetes, CHF, asthma, CKD, and hypothyroidism who presents to the ED complaining of abnormal labs.  Patient reports that she went to see her PCP today after dealing with persistent nausea, vomiting, and diarrhea over the past 2 weeks.  She then received a call later in the evening that her calcium was critically low and that she needed to go to the ER for further evaluation.  Patient reports that she has been feeling generally weak, has been intermittently able to tolerate liquids but unable to tolerate solids at all over the past week.  She denies any associated abdominal pain and has not had any fevers, dysuria, or flank pain.  She has continued to take her medications as prescribed.     Physical Exam   Triage Vital Signs: ED Triage Vitals  Enc Vitals Group     BP 08/02/22 0150 (!) 156/60     Pulse Rate 08/02/22 0150 84     Resp 08/02/22 0150 (!) 26     Temp 08/02/22 0150 97.6 F (36.4 C)     Temp Source 08/02/22 0150 Oral     SpO2 08/02/22 0150 98 %     Weight 08/02/22 0147 165 lb 12.6 oz (75.2 kg)     Height 08/02/22 0147 5' 2"  (1.575 m)     Head Circumference --      Peak Flow --      Pain Score 08/02/22 0146 0     Pain Loc --      Pain Edu? --      Excl. in DISH? --     Most recent vital signs: Vitals:   08/02/22 0230 08/02/22 0300  BP: (!) 140/61 (!) 147/63  Pulse: 78 77  Resp: (!) 21 16  Temp:    SpO2: 97% 98%    Constitutional: Alert and oriented. Eyes: Conjunctivae are normal. Head: Atraumatic. Nose: No congestion/rhinnorhea. Mouth/Throat: Mucous membranes are dry. Cardiovascular: Normal rate, regular rhythm. Grossly normal heart  sounds.  2+ radial pulses bilaterally. Respiratory: Normal respiratory effort.  No retractions. Lungs CTAB. Gastrointestinal: Soft and nontender. No distention. Musculoskeletal: No lower extremity tenderness nor edema.  Neurologic:  Normal speech and language. No gross focal neurologic deficits are appreciated.    ED Results / Procedures / Treatments   Labs (all labs ordered are listed, but only abnormal results are displayed) Labs Reviewed  BASIC METABOLIC PANEL - Abnormal; Notable for the following components:      Result Value   Chloride 113 (*)    CO2 12 (*)    Glucose, Bld 149 (*)    BUN 90 (*)    Creatinine, Ser 3.47 (*)    Calcium 5.5 (*)    GFR, Estimated 13 (*)    All other components within normal limits  CBC - Abnormal; Notable for the following components:   RBC 3.77 (*)    Hemoglobin 10.7 (*)    HCT 31.8 (*)    All other components within normal limits  MAGNESIUM - Abnormal; Notable for the following components:   Magnesium <0.5 (*)    All other components within normal limits  PHOSPHORUS - Abnormal; Notable for  the following components:   Phosphorus 5.9 (*)    All other components within normal limits  TROPONIN I (HIGH SENSITIVITY) - Abnormal; Notable for the following components:   Troponin I (High Sensitivity) 18 (*)    All other components within normal limits  HEPATIC FUNCTION PANEL  URINALYSIS, ROUTINE W REFLEX MICROSCOPIC  CALCIUM, IONIZED  TROPONIN I (HIGH SENSITIVITY)     EKG  ED ECG REPORT I, Blake Divine, the attending physician, personally viewed and interpreted this ECG.   Date: 08/02/2022  EKG Time: 1:50  Rate: 81  Rhythm: normal sinus rhythm  Axis: Normal  Intervals:none  ST&T Change: None  PROCEDURES:  Critical Care performed: Yes, see critical care procedure note(s)  .Critical Care  Performed by: Blake Divine, MD Authorized by: Blake Divine, MD   Critical care provider statement:    Critical care time (minutes):   30   Critical care time was exclusive of:  Separately billable procedures and treating other patients and teaching time   Critical care was necessary to treat or prevent imminent or life-threatening deterioration of the following conditions:  Metabolic crisis and renal failure   Critical care was time spent personally by me on the following activities:  Development of treatment plan with patient or surrogate, discussions with consultants, evaluation of patient's response to treatment, examination of patient, ordering and review of laboratory studies, ordering and review of radiographic studies, ordering and performing treatments and interventions, pulse oximetry, re-evaluation of patient's condition and review of old charts   I assumed direction of critical care for this patient from another provider in my specialty: no     Care discussed with: admitting provider      MEDICATIONS ORDERED IN ED: Medications  magnesium sulfate IVPB 2 g 50 mL (2 g Intravenous New Bag/Given 08/02/22 0343)  calcium gluconate 1 g/ 50 mL sodium chloride IVPB (1,000 mg Intravenous New Bag/Given 08/02/22 0317)  sodium bicarbonate 150 mEq in dextrose 5 % 1,150 mL infusion (has no administration in time range)  lactated ringers bolus 1,000 mL (1,000 mLs Intravenous New Bag/Given 08/02/22 0303)  ondansetron (ZOFRAN) injection 4 mg (4 mg Intravenous Given 08/02/22 0258)     IMPRESSION / MDM / [place] / ED COURSE  I reviewed the triage vital signs and the nursing notes.                              81 y.o. female with past medical history of hypertension, hyperlipidemia, diabetes, CHF, asthma, CKD, and hypothyroidism who presents to the ED complaining of generalized weakness along with 2 weeks of persistent nausea, vomiting, and diarrhea.  Patient's presentation is most consistent with acute presentation with potential threat to life or bodily function.  Differential diagnosis includes, but is not limited to,  hypocalcemia, AKI, hypomagnesemia, hypokalemia, hyponatremia, UTI, gastroenteritis, diverticulitis, appendicitis.  Patient nontoxic-appearing and in no acute distress, vital signs are unremarkable and patient has a benign abdominal exam.  She does appear dehydrated and initial labs are concerning for significant hypocalcemia with AKI and associated acidosis.  This is likely due to dehydration and poor p.o. intake, we will hydrate with IV fluids and give dose of IV calcium gluconate.  No associated EKG changes noted, QTc within normal limits.  We will add on ionized calcium, magnesium, phosphorus, and LFTs to assess her albumin level.  Sodium and potassium levels are unremarkable, no significant anemia or leukocytosis noted, troponin mildly elevated but  suspect this is secondary to AKI.  Labs also remarkable for significant hypomagnesemia, which we will replete with her IV calcium.  LFTs are unremarkable with no correction of calcium needed for abnormal albumin.  We will start patient on bicarb drip given significant AKI with associated acidosis.  Case discussed with hospitalist for admission.      FINAL CLINICAL IMPRESSION(S) / ED DIAGNOSES   Final diagnoses:  AKI (acute kidney injury) ([place])  Hypocalcemia  Hypomagnesemia     Rx / DC Orders   ED Discharge Orders     None        Note:  This document was prepared using Dragon voice recognition software and may include unintentional dictation errors.   Blake Divine, MD 08/02/22 [phone]

## 2022-08-02 NOTE — Assessment & Plan Note (Signed)
Continue amlodipine, hydralazine and metoprolol.  Holding spironolactone and torsemide for now due to vomiting and diarrhea

## 2022-08-02 NOTE — Assessment & Plan Note (Addendum)
Possible acute gastroenteritis versus uremic symptoms IV hydration, IV antiemetics Clear liquid diet We will get stool studies to include C. difficile and GI panel in view of recent hospitalization

## 2022-08-02 NOTE — Assessment & Plan Note (Signed)
Received 2 g IV magnesium in the ED Continue to monitor and replete

## 2022-08-02 NOTE — Assessment & Plan Note (Addendum)
Metabolic acidosis Possible early uremia AKI likely prerenal and related to ongoing vomiting and diarrhea as well as diuretics of torsemide and spironolactone for CHF treatment, initiated during recent hospitalization in September 2023 Patient presenting with recurring vomiting, diarrhea previously attributed to [name] as well as dizziness, shortness of breath Creatinine 3.47, up from baseline of 2.4 to now with GFR of 13 Started improving Continue bicarb infusion for another day Nephrology consulted for to follow.  Patient was recently being considered for initiation of dialysis and has an AV fistula

## 2022-08-02 NOTE — ED Notes (Signed)
Informed RN bed assigned 

## 2022-08-02 NOTE — Assessment & Plan Note (Signed)
Continue Zoloft 

## 2022-08-02 NOTE — H&P (Addendum)
History and Physical    Patient: [name] QVZ:[id number] DOB: 1941/08/19 DOA: 08/02/2022 DOS: the patient was seen and examined on 08/02/2022 PCP: Kirk Ruths, MD  Patient coming from: Home  Chief Complaint:  Chief Complaint  Patient presents with   Weakness   Abnormal Lab    Potassium at PCP 5.6    HPI: [name] is a 81 y.o. female with medical history significant for CKD 4, anemia of CKD, diastolic CHF, HTN, hypothyroidism, COPD, hospitalized from 9/9 to 06/21/2022 with vomiting and diarrhea attributed to [place] at which time she had severe hypocalcemia and hypomagnesemia which were replenished, who presents to the ED with a 2-week history of recurrent vomiting and diarrhea, associated with dizziness that led to a fall and near passing out episode about 3 days ago. She hit her head when she fell and thinks she might have passed out and since the fall had dizziness described as a spinning sensation when turning her head to the left. She also has shortness of breath, typical for her asthma/COPD. Denies chest pain. She feels like her nausea and diarrhea never did completely resolve after her last admission. During her recent past hospitalization, she had acute worsening of her chronic kidney disease and she had vascular access placed however dialysis was not initiated due to improving renal function.  She last saw her nephrologist on 10/16. ED course and data review: BP 156/68, pulse 84 and respirations 26 with O2 sat 98% on room air.  Labs notable for creatinine of 3.47 up from 2.42 a month ago, with bicarb of 12, GFR 13 down from 20 a month ago.  Potassium 3.9.  Calcium 5.5 down from 8.1 on 10/5.  Magnesium less than 0.5 and phosphorus 5.9.  Troponin 18.  Hemoglobin 10.7, up from 8.7 on 9/20. EKG, personally viewed and interpreted showing sinus at 81 with no acute ST-T wave changes. Chest x-ray not done in the ED  Patient treated with an LR bolus, calcium gluconate 1 g x 2,  magnesium sulfate 2 g and started on a bicarb drip.  Hospitalist consulted for admission.   Review of Systems: As mentioned in the history of present illness. All other systems reviewed and are negative.  Past Medical History:  Diagnosis Date   Actinic keratosis    Albuminuria    Anemia    Arthritis    Asthma    Basal cell carcinoma 04/12/2017   Above right lateral brow. Nodulocystic type. EDC   Cancer ([place])    skin   Cataract cortical, senile    CHF (congestive heart failure) ([place])    Diabetes mellitus without complication (HCC)    GERD (gastroesophageal reflux disease)    Hemorrhoids    History of kidney stones    Hyperlipidemia    Hypertension    Hypothyroidism    Lyme disease    No kidney function    OSA (obstructive sleep apnea)    Osteoporosis    Osteoporosis    Reflux esophagitis    Steatohepatitis    Steatohepatitis    Past Surgical History:  Procedure Laterality Date   ABDOMINAL HYSTERECTOMY     APPENDECTOMY     AV FISTULA PLACEMENT Left 05/19/2022   Procedure: ARTERIOVENOUS (AV) FISTULA CREATION ( BRACHIAL CEPHALIC );  Surgeon: Katha Cabal, MD;  Location: [facility] ORS;  Service: Vascular;  Laterality: Left;   [place]  08/13/2014   [facility]. no significant CAD,  normal LVEDP.    CATARACT EXTRACTION     CHOLECYSTECTOMY     COLONOSCOPY     COLONOSCOPY WITH PROPOFOL N/A 12/07/2016   Procedure: COLONOSCOPY WITH PROPOFOL;  Surgeon: Lollie Sails, MD;  Location: [facility] ENDOSCOPY;  Service: Endoscopy;  Laterality: N/A;   ESOPHAGOGASTRODUODENOSCOPY     ESOPHAGOGASTRODUODENOSCOPY (EGD) WITH PROPOFOL N/A 12/07/2016   Procedure: ESOPHAGOGASTRODUODENOSCOPY (EGD) WITH PROPOFOL;  Surgeon: Lollie Sails, MD;  Location: [facility] ENDOSCOPY;  Service: Endoscopy;  Laterality: N/A;   ESOPHAGOGASTRODUODENOSCOPY (EGD) WITH PROPOFOL N/A 01/07/2018   Procedure: ESOPHAGOGASTRODUODENOSCOPY (EGD) WITH PROPOFOL;  Surgeon: Lollie Sails, MD;  Location: [facility] ENDOSCOPY;  Service: Endoscopy;  Laterality: N/A;   EYE SURGERY     HEMORRHOID SURGERY     PARTIAL HYSTERECTOMY     THYROIDECTOMY N/A 08/25/2019   Procedure: THYROIDECTOMY EXTRACTION OF SUBTOTAL COMPONENT; PARATHYROID AUTOTRANSPLANT X1;  Surgeon: Fredirick Maudlin, MD;  Location: [facility] ORS;  Service: General;  Laterality: N/A;  With Nerve Monitoring(RLN)   TONSILLECTOMY     Social History:  reports that she has never smoked. She has never used smokeless tobacco. She reports that she does not drink alcohol and does not use drugs.  Allergies  Allergen Reactions   Ace Inhibitors     Other reaction(s): Unknown   Eggs Or Egg-Derived Products Diarrhea   Other     Other reaction(s): Other (See Comments) Eggs   Prednisone     Other reaction(s): Other (See Comments) joint pain   Risedronate     Other reaction(s): Other (See Comments)   Sulfa Antibiotics     Other reaction(s): Other (See Comments)   Sulfasalazine Other (See Comments)    Family History  Problem Relation Age of Onset   Breast cancer Mother    Heart attack Father     Prior to Admission medications   Medication Sig Start Date End Date Taking? Authorizing Provider  acetaminophen (TYLENOL) 500 MG tablet Take 1,000 mg by mouth every 6 (six) hours as needed (pain).    [provider]  albuterol (PROVENTIL HFA;VENTOLIN HFA) 108 (90 BASE) MCG/ACT inhaler Inhale 1-2 puffs into the lungs every 4 (four) hours as needed for wheezing or shortness of breath.    [provider]  amLODipine (NORVASC) 5 MG tablet Take 5 mg by mouth daily. 05/23/22   [provider]  azelastine (ASTELIN) 0.1 % nasal spray ONE SPRAY INTO BOTH NOSTRILS TWICE DAILY 09/14/20   [provider]  calcitRIOL (ROCALTROL) 0.5 MCG capsule Take 1 capsule (0.5 mcg total) by mouth daily. 06/22/22   Sharen Hones, MD  esomeprazole ([place]) 40 MG packet Take 40 mg by mouth daily before breakfast.    [provider]  Fluticasone-Salmeterol (ADVAIR) 250-50 MCG/DOSE AEPB Inhale 1 puff into the lungs 2 (two) times daily as needed (asthma).     [provider]  hydrALAZINE (APRESOLINE) 100 MG tablet Take 100 mg by mouth 2 (two) times daily.    [provider]  HYDROcodone-acetaminophen (NORCO) 5-325 MG tablet Take 1 tablet by mouth every 6 (six) hours as needed for moderate pain. 05/19/22   Schnier, Dolores Lory, MD  Ipratropium-Albuterol (COMBIVENT) 20-100 MCG/ACT AERS respimat Inhale into the lungs.    [provider]  LAGEVRIO 200 MG CAPS capsule Take 4 capsules by mouth 2 (two) times daily. Patient not taking: Reported on 07/10/2022 06/06/22   [provider]  levothyroxine (SYNTHROID) 137 MCG tablet Take 1 tablet (137 mcg total) by mouth daily at 6 (six)  AM. 08/27/19   Tylene Fantasia, PA-C  lidocaine (LIDODERM) 5 % Place 1 patch onto the skin every 12 (twelve) hours. Remove & Discard patch within 12 hours or as directed by MD Patient not taking: Reported on 07/10/2022 06/08/22   Carrie Mew, MD  loratadine (CLARITIN) 10 MG tablet Take 10 mg by mouth daily.    [provider]  metoprolol tartrate (LOPRESSOR) 50 MG tablet Take 50 mg by mouth 2 (two) times daily.  08/27/17   [provider]  montelukast (SINGULAIR) 10 MG tablet Take 10 mg by mouth at bedtime.    [provider]  Multiple Minerals-Vitamins (CALCIUM & VIT D3 BONE HEALTH PO) Take 1 tablet by mouth daily. 600 mg/ 25 mg    [provider]  Potassium Bicarb-Citric Acid 20 MEQ TBEF Take 1 tablet by mouth daily.    [provider]  rosuvastatin (CRESTOR) 40 MG tablet Take 40 mg by mouth daily.    [provider]  sertraline (ZOLOFT) 100 MG tablet Take 100 mg by mouth daily.    [provider]  spironolactone (ALDACTONE) 25 MG tablet Take 25 mg by mouth daily. 07/06/22   [provider]  torsemide (DEMADEX) 10 MG tablet Take 10 mg by  mouth daily. Take 1-2 tablets daily    [provider]    Physical Exam: Vitals:   08/02/22 0150 08/02/22 0200 08/02/22 0230 08/02/22 0300  BP: (!) 156/60 137/60 (!) 140/61 (!) 147/63  Pulse: 84 77 78 77  Resp: (!) 26 (!) 22 (!) 21 16  Temp: 97.6 F (36.4 C)     TempSrc: Oral     SpO2: 98% 97% 97% 98%  Weight:      Height:       Physical Exam Vitals and nursing note reviewed.  Constitutional:      General: She is not in acute distress. HENT:     Head: Normocephalic and atraumatic.  Cardiovascular:     Rate and Rhythm: Normal rate and regular rhythm.     Heart sounds: Normal heart sounds.  Pulmonary:     Effort: Pulmonary effort is normal. Tachypnea present.     Breath sounds: Normal breath sounds.  Abdominal:     Palpations: Abdomen is soft.     Tenderness: There is no abdominal tenderness.  Neurological:     General: No focal deficit present.     Mental Status: She is oriented to person, place, and time.     Labs on Admission: I have personally reviewed following labs and imaging studies  CBC: Recent Labs  Lab 08/02/22 0157  WBC 8.8  HGB 10.7*  HCT 31.8*  MCV 84.4  PLT 220   Basic Metabolic Panel: Recent Labs  Lab 08/02/22 0157  NA 137  K 3.9  CL 113*  CO2 12*  GLUCOSE 149*  BUN 90*  CREATININE 3.47*  CALCIUM 5.5*  MG <0.5*  PHOS 5.9*   GFR: Estimated Creatinine Clearance: 12.1 mL/min (A) (by C-G formula based on SCr of 3.47 mg/dL (H)). Liver Function Tests: Recent Labs  Lab 08/02/22 0157  AST 27  ALT 25  ALKPHOS 51  BILITOT 0.7  PROT 7.5  ALBUMIN 3.7   No results for input(s): "LIPASE", "AMYLASE" in the last 168 hours. No results for input(s): "AMMONIA" in the last 168 hours. Coagulation Profile: No results for input(s): "INR", "PROTIME" in the last 168 hours. Cardiac Enzymes: No results for input(s): "CKTOTAL", "CKMB", "CKMBINDEX", "TROPONINI" in the last 168  hours. BNP (last 3 results) No results for input(s): "PROBNP"  in the last 8760 hours. HbA1C: No results for input(s): "HGBA1C" in the last 72 hours. CBG: No results for input(s): "GLUCAP" in the last 168 hours. Lipid Profile: No results for input(s): "CHOL", "HDL", "LDLCALC", "TRIG", "CHOLHDL", "LDLDIRECT" in the last 72 hours. Thyroid Function Tests: No results for input(s): "TSH", "T4TOTAL", "FREET4", "T3FREE", "THYROIDAB" in the last 72 hours. Anemia Panel: No results for input(s): "VITAMINB12", "FOLATE", "FERRITIN", "TIBC", "IRON", "RETICCTPCT" in the last 72 hours. Urine analysis:    Component Value Date/Time   COLORURINE AMBER (A) 06/13/2022 0547   APPEARANCEUR CLOUDY (A) 06/13/2022 0547   APPEARANCEUR Clear 09/17/2018 1028   LABSPEC 1.015 06/13/2022 0547   LABSPEC 1.013 06/20/2012 1508   PHURINE 5.0 06/13/2022 0547   GLUCOSEU NEGATIVE 06/13/2022 0547   GLUCOSEU Negative 06/20/2012 1508   HGBUR NEGATIVE 06/13/2022 0547   BILIRUBINUR NEGATIVE 06/13/2022 0547   BILIRUBINUR Negative 09/17/2018 1028   BILIRUBINUR Negative 06/20/2012 1508   [place] 06/13/2022 0547   PROTEINUR NEGATIVE 06/13/2022 0547   NITRITE NEGATIVE 06/13/2022 0547   LEUKOCYTESUR NEGATIVE 06/13/2022 0547   LEUKOCYTESUR Negative 06/20/2012 1508    Radiological Exams on Admission: No results found.   Data Reviewed: Relevant notes from primary care and specialist visits, past discharge summaries as available in EHR, including Care Everywhere. Prior diagnostic testing as pertinent to current admission diagnoses Updated medications and problem lists for reconciliation ED course, including vitals, labs, imaging, treatment and response to treatment Triage notes, nursing and pharmacy notes and ED provider's notes Notable results as noted in HPI   Assessment and Plan: * Acute metabolic encephalopathy Patient with ongoing dizziness that led to a fall 3 days prior Secondary to electrolyte derangements, possible early uremia Replete electrolytes and  monitor Neurologic checks with fall and aspiration precautions  Acute renal failure superimposed on stage 4 chronic kidney disease (HCC) Metabolic acidosis Possible early uremia AKI likely prerenal and related to ongoing vomiting and diarrhea as well as diuretics of torsemide and spironolactone for CHF treatment, initiated during recent hospitalization in September 2023 Patient presenting with recurring vomiting, diarrhea previously attributed to [place] as well as dizziness, shortness of breath Creatinine 3.47, up from baseline of 2.4 to now with GFR of 13 Continue bicarb infusion Nephrology consulted for to follow.  Patient was recently being considered for initiation of dialysis and has an AV fistula  Dizziness s/p fall Patient fell on 10/30 and hit her head and says since then she feels the room spinning when she looks to the left. No prior history of vertigo Head CT ordered  Vomiting and diarrhea Possible acute gastroenteritis versus uremic symptoms IV hydration, IV antiemetics Clear liquid diet We will get stool studies to include C. 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 Hemoglobin stable    Latest Ref Rng & Units 08/02/2022    1:57 AM 06/21/2022    7:05 AM 06/20/2022    4:07 AM  CBC  WBC 4.0 - 10.5 K/uL 8.8  9.4  9.4   Hemoglobin 12.0 - 15.0 g/dL 10.7  8.7  8.8   Hematocrit 36.0 - 46.0 % 31.8  27.2  27.4   Platelets 150 - 400 K/uL 193  179  194      Chronic bronchitis (HCC) DuoNebs as needed  Chronic diastolic CHF (congestive heart failure) (HCC) Clinically euvolemic to dry Most recent EF September 2023 60% Will hold torsemide and  spironolactone due to ongoing vomiting and diarrhea Continue metoprolol and hydralazine Monitor for fluid overload in view of IV fluids hydration for acute kidney injury Daily weights  Hyperphosphatemia Defer to nephrology   Depression Continue Zoloft  S/P total thyroidectomy Continue Synthroid  OSA on CPAP CPAP  Essential hypertension Continue amlodipine, hydralazine and metoprolol.  Holding spironolactone and torsemide for now due to vomiting and diarrhea        DVT prophylaxis: Lovenox  Consults: Nephrology, Dr. Holley Raring 9/9 to 06/21/2022  Advance Care Planning:   Code Status: Prior   Family Communication: none  Disposition Plan: Back to previous home environment  Severity of Illness: The appropriate patient status for this patient is INPATIENT. Inpatient status is judged to be reasonable and necessary in order to provide the required intensity of service to ensure the patient's safety. The patient's presenting symptoms, physical exam findings, and initial radiographic and laboratory data in the context of their chronic comorbidities is felt to place them at high risk for further clinical deterioration. Furthermore, it is not anticipated that the patient will be medically stable for discharge from the hospital within 2 midnights of admission.   * I certify that at the point of admission it is my clinical judgment that the patient will require inpatient hospital care spanning beyond 2 midnights from the point of admission due to high intensity of service, high risk for further deterioration and high frequency of surveillance required.*  Author: Athena Masse, MD 08/02/2022 4:15 AM  For on call review [email or web address].

## 2022-08-02 NOTE — ED Notes (Signed)
ED Provider at bedside. 

## 2022-08-02 NOTE — Assessment & Plan Note (Signed)
DuoNebs as needed

## 2022-08-02 NOTE — ED Notes (Signed)
Rosanne Ashing pt's daughter [phone]

## 2022-08-02 NOTE — Assessment & Plan Note (Signed)
Secondary hyperparathyroidism Patient is on daily calcitriol by nephrology Received calcium gluconate 1 g x 2 in the ED Continue to monitor and replete as necessary

## 2022-08-02 NOTE — ED Triage Notes (Addendum)
Pt arrived via POV from home with reports of low calcium level of 5.6  from PCP's office Pt referred to ED by PCP's office.  Pt reports she has been weak and dizzy x 2 weeks.  Pt alert and oriented at this time. Also c/o shakiness.   Pt also c/o shortness of breath and states she fell on Friday after being dizzy states she did hit her head. Pt reports she blacked out "a little bit, not much"

## 2022-08-02 NOTE — Assessment & Plan Note (Signed)
Resolved. Electrolyte derangement with N/V/D, and uremia can be contributory. Now alert and oriented-appears to be at baseline. PT/OT are recommending home health.

## 2022-08-02 NOTE — ED Notes (Signed)
Date and time results received: 08/02/22 1152  Test: Calcium Critical Value: 6.0  Name of Provider Notified: Dr. Reesa Chew

## 2022-08-02 NOTE — Assessment & Plan Note (Addendum)
Clinically euvolemic to dry Most recent EF September 2023 60% Will hold torsemide and spironolactone due to ongoing vomiting and diarrhea Continue metoprolol and hydralazine Monitor for fluid overload in view of IV fluids hydration for acute kidney injury Daily weights

## 2022-08-02 NOTE — Progress Notes (Signed)
[facility]  ROUNDING NOTE   Subjective:   [name] is a 81 y.o. female with past medical history  Anemia, dCHF, hypertension, COPD, and chronic kidney disease stage 4. Patient presents to the ED with dizziness, weakness and falls. Patient has been admitted for Uremia [N19]  Patient is known to our practice and is followed outpatient by Dr Holley Raring. She is seen resting quietly on a stretcher, husband at bedside.  Patient states she has been getting progressively weaker over the past few days.  She has fallen a couple of times.  Also complains of nausea and vomiting.  Labs on ED arrival significant for serum bicarb 12, glucose 149, BUN 90, creatinine 3.47 with GFR of 13, magnesium less than 0.5 and calcium 5.5.  Hemoglobin 10.7.  Chest x-ray negative.  CT head with age-related changes.  C. difficile negative.  We have been consulted to help manage hypocalcemia and acute kidney injury.  Objective:  Vital signs in last 24 hours:  Temp:  [97.6 F (36.4 C)-98.9 F (37.2 C)] 98.8 F (37.1 C) (11/01 1251) Pulse Rate:  [66-91] 66 (11/01 1400) Resp:  [15-36] 22 (11/01 1400) BP: (113-156)/(44-63) 116/44 (11/01 1400) SpO2:  [93 %-98 %] 94 % (11/01 1400) Weight:  [75.2 kg] 75.2 kg (11/01 0147)  Weight change:  Filed Weights   08/02/22 0147  Weight: 75.2 kg    Intake/Output: I/O last 3 completed shifts: In: 1100.2 [IV Piggyback:1100.2] Out: -    Intake/Output this shift:  No intake/output data recorded.  Physical Exam: General: NAD  Head: Normocephalic, atraumatic.  Dry oral mucosal membranes  Eyes: Anicteric  Lungs:  Clear to auscultation, normal effort, room air  Heart: Regular rate and rhythm  Abdomen:  Soft, nontender, obese  Extremities: No peripheral edema.  Neurologic: Nonfocal, moving all four extremities  Skin: No lesions  Access: None    Basic Metabolic Panel: Recent Labs  Lab 08/02/22 0157 08/02/22 1053  NA 137 139  K 3.9 3.3*  CL 113* 112*   CO2 12* 16*  GLUCOSE 149* 160*  BUN 90* 82*  CREATININE 3.47* 2.98*  CALCIUM 5.5* 6.0*  MG <0.5* 1.5*  PHOS 5.9*  --     Liver Function Tests: Recent Labs  Lab 08/02/22 0157  AST 27  ALT 25  ALKPHOS 51  BILITOT 0.7  PROT 7.5  ALBUMIN 3.7   No results for input(s): "LIPASE", "AMYLASE" in the last 168 hours. No results for input(s): "AMMONIA" in the last 168 hours.  CBC: Recent Labs  Lab 08/02/22 0157  WBC 8.8  HGB 10.7*  HCT 31.8*  MCV 84.4  PLT 193    Cardiac Enzymes: No results for input(s): "CKTOTAL", "CKMB", "CKMBINDEX", "TROPONINI" in the last 168 hours.  BNP: Invalid input(s): "POCBNP"  CBG: No results for input(s): "GLUCAP" in the last 168 hours.  Microbiology: Results for orders placed or performed during the hospital encounter of 08/02/22  C Difficile Quick Screen w PCR reflex     Status: None   Collection Time: 08/02/22 12:50 PM   Specimen: STOOL  Result Value Ref Range Status   C Diff antigen NEGATIVE NEGATIVE Final   C Diff toxin NEGATIVE NEGATIVE Final   C Diff interpretation No C. difficile detected.  Final    Comment: Performed at [facility], [address] [place], [place] [address]  Gastrointestinal Panel by PCR , Stool     Status: None   Collection Time: 08/02/22 12:50 PM   Specimen: STOOL  Result Value Ref Range Status   Campylobacter species NOT DETECTED NOT DETECTED Final   Plesimonas shigelloides NOT DETECTED NOT DETECTED Final   Salmonella species NOT DETECTED NOT DETECTED Final   Yersinia enterocolitica NOT DETECTED NOT DETECTED Final   Vibrio species NOT DETECTED NOT DETECTED Final   Vibrio cholerae NOT DETECTED NOT DETECTED Final   Enteroaggregative E coli (EAEC) NOT DETECTED NOT DETECTED Final   Enteropathogenic E coli (EPEC) NOT DETECTED NOT DETECTED Final   Enterotoxigenic E coli (ETEC) NOT DETECTED NOT DETECTED Final   Shiga like toxin producing E coli (STEC) NOT DETECTED NOT DETECTED Final    Shigella/Enteroinvasive E coli (EIEC) NOT DETECTED NOT DETECTED Final   Cryptosporidium NOT DETECTED NOT DETECTED Final   Cyclospora cayetanensis NOT DETECTED NOT DETECTED Final   Entamoeba histolytica NOT DETECTED NOT DETECTED Final   Giardia lamblia NOT DETECTED NOT DETECTED Final   Adenovirus F40/41 NOT DETECTED NOT DETECTED Final   Astrovirus NOT DETECTED NOT DETECTED Final   Norovirus GI/GII NOT DETECTED NOT DETECTED Final   Rotavirus A NOT DETECTED NOT DETECTED Final   Sapovirus (I, II, IV, and V) NOT DETECTED NOT DETECTED Final    Comment: Performed at [facility], [address] [place], [place] [address]    Coagulation Studies: No results for input(s): "LABPROT", "INR" in the last 72 hours.  Urinalysis: Recent Labs    08/02/22 0157  [place] 1.006  PHURINE 5.0  GLUCOSEU NEGATIVE  HGBUR NEGATIVE  BILIRUBINUR NEGATIVE  KETONESUR NEGATIVE  PROTEINUR NEGATIVE  NITRITE NEGATIVE  LEUKOCYTESUR SMALL*      Imaging: CT HEAD WO CONTRAST (5MM)  Result Date: 08/02/2022 CLINICAL DATA:  Minor head trauma. Nausea and vomiting for a month since COVID EXAM: CT HEAD WITHOUT CONTRAST TECHNIQUE: Contiguous axial images were obtained from the base of the skull through the vertex without intravenous contrast. RADIATION DOSE REDUCTION: This exam was performed according to the departmental dose-optimization program which includes automated exposure control, adjustment of the mA and/or kV according to patient size and/or use of iterative reconstruction technique. COMPARISON:  09/30/2015 FINDINGS: Brain: No evidence of acute infarction, hemorrhage, hydrocephalus, extra-axial collection or mass lesion/mass effect. Suspect interval lacunar infarct in the left thalamus, no history of focal deficit. Progressed but still mild for age chronic small vessel ischemia in the cerebral white matter. Age normal brain volume. Vascular: No hyperdense vessel or unexpected  calcification. Skull: Normal. Negative for fracture or focal lesion. Sinuses/Orbits: Opacified left ethmoid air cell with expansion, mucocele appearance but stable from 2016. IMPRESSION: 1. No evidence of intracranial injury. 2. Chronic small vessel ischemia that has progressed from 2016. 3. Focal left ethmoid sinus opacification with expansion and suspected mucocele but stable from 2016. Electronically Signed   By: Jorje Guild M.D.   On: 08/02/2022 05:55   DG Chest Port 1 View  Result Date: 08/02/2022 CLINICAL DATA:  Shortness of breath EXAM: PORTABLE CHEST 1 VIEW COMPARISON:  06/17/2022 FINDINGS: Chronic cardiomegaly. Stable aortic and hilar contours. There is no edema, consolidation, effusion, or pneumothorax. Artifact from EKG leads. IMPRESSION: No evidence of active disease. Electronically Signed   By: Jorje Guild M.D.   On: 08/02/2022 04:48     Medications:     amLODipine  5 mg Oral Daily   calcitRIOL  0.5 mcg Oral Daily   enoxaparin (LOVENOX) injection  30 mg Subcutaneous Q24H   hydrALAZINE  100 mg Oral BID   levothyroxine  150 mcg Oral Q0600  metoprolol tartrate  50 mg Oral BID   mometasone-formoterol  2 puff Inhalation BID   pantoprazole  40 mg Oral QAC breakfast   rosuvastatin  40 mg Oral QHS   sertraline  100 mg Oral Daily   acetaminophen **OR** acetaminophen, albuterol, ondansetron **OR** ondansetron (ZOFRAN) IV  Assessment/ Plan:  [name] is a 81 y.o.  female ith past medical history  Anemia, dCHF, hypertension, COPD, and chronic kidney disease stage 4. Patient presents to the ED with dizziness, weakness and falls. Patient has been admitted for Uremia [N19]   Acute Kidney Injury on chronic kidney disease stage IV with baseline creatinine 2.71 and GFR of 17 on 05/04/22.  Acute kidney injury secondary to hypovolemia from poor oral intake Chronic kidney disease is secondary to diabetes, proteinuria, and positive ANA. Agree with IVF for hydration.  No acute  need for dialysis at this time but monitoring closely.  Avoid nephrotoxic agents and therapies.    Lab Results  Component Value Date   CREATININE 2.98 (H) 08/02/2022   CREATININE 3.47 (H) 08/02/2022   CREATININE 2.42 (H) 06/21/2022    Intake/Output Summary (Last 24 hours) at 08/02/2022 1436 Last data filed at 08/02/2022 0501 Gross per 24 hour  Intake 1100.17 ml  Output --  Net 1100.17 ml   2. Hypocalcemia/Hypomagnesia/Hypokalemia.  Calcium 5.5 on admission.  Patient received 1 g calcium gluconate overnight and additional 1 g this morning.  Potassium currently 3.3.  Magnesium on admission less than 0.5, total 6 g magnesium sulfate supplementation received.  3. Anemia of chronic kidney disease Lab Results  Component Value Date   HGB 10.7 (L) 08/02/2022    Hemoglobin within acceptable range.  We will continue to monitor  4.  Hypertension with chronic kidney disease.  Home regimen includes amlodipine, hydralazine, metoprolol, spironolactone, and torsemide.  Currently receiving amlodipine hydralazine and metoprolol.  Blood pressure currently 119/47.   LOS: 0 Sitlaly Gudiel 11/1/20232:36 PM

## 2022-08-02 NOTE — Hospital Course (Addendum)
Taken from H&P.   [name] is a 81 y.o. female with medical history significant for CKD 4, anemia of CKD, diastolic CHF, HTN, hypothyroidism, COPD, hospitalized from 9/9 to 06/21/2022 with vomiting and diarrhea attributed to [place] at which time she had severe hypocalcemia and hypomagnesemia which were replenished, who presents to the ED with a 2-week history of recurrent vomiting and diarrhea, associated with dizziness that led to a fall and near passing out episode about 3 days ago. She hit her head when she fell and thinks she might have passed out and since the fall had dizziness described as a spinning sensation when turning her head to the left. She also has shortness of breath, typical for her asthma/COPD. Denies chest pain. She feels like her nausea and diarrhea never completely resolve after her last admission. ED course and data review: BP 156/68, pulse 84 and respirations 26 with O2 sat 98% on room air.  Labs notable for creatinine of 3.47 up from 2.42 a month ago, with bicarb of 12, GFR 13 down from 20 a month ago.  Potassium 3.9.  Calcium 5.5 down from 8.1 on 10/5.  Magnesium less than 0.5 and phosphorus 5.9.  Troponin 18.  Hemoglobin 10.7, up from 8.7 on 9/20. EKG, personally viewed and interpreted showing sinus at 81 with no acute ST-T wave changes.  Patient treated with an LR bolus, calcium gluconate 1 g x 2, magnesium sulfate 2 g and started on a bicarb drip.  11/1: Some improvement to magnesium but remained low at 1.5 and calcium remain low at 6.  Further replacement ordered.  Patient appears alert and oriented x3.  Tolerated breakfast.  Denies any more vomiting or diarrhea.  Patient has history of chronic intermittent diarrhea. C. difficile and GI pathogen panel was negative. CT head with no evidence of acute intracranial injury. Renal functions and bicarb improving.  11/2: BUN/creatinine continue to improve but still above baseline.  Corrected calcium remains low.  Nephrology  would like to continue gentle IV fluids with bicarb infusion for another day. Also ordered more calcium and magnesium replacement. Nausea and vomiting resolved.  Able to tolerate diet.  Continue to have intermittent diarrhea. Imodium was added.  11/3: Corrected calcium at 7.3, renal function continues to improve, mild hypokalemia with potassium of 3.3.  Calcium is being repleted again.  Nephrology wants calcium to be at least at 8 before discharge.  PT is recommending home health which were ordered.  11/4: Corrected calcium improved to 8.4, renal functions continue to improve with creatinine of 1, acidosis resolved.  Magnesium at 1.7.  Repleting both magnesium and increasing home dose of calcitriol from daily to twice daily. Patient did not had any nausea, vomiting or diarrhea today.  Able to tolerate diet well. She was instructed to keep herself well-hydrated.  She will continue with current medications and need to have a close follow-up with her providers for further recommendations.

## 2022-08-03 DIAGNOSIS — G9341 Metabolic encephalopathy: Secondary | ICD-10-CM | POA: Diagnosis not present

## 2022-08-03 LAB — RENAL FUNCTION PANEL
Albumin: 3.5 g/dL (ref 3.5–5.0)
Anion gap: 11 (ref 5–15)
BUN: 72 mg/dL — ABNORMAL HIGH (ref 8–23)
CO2: 17 mmol/L — ABNORMAL LOW (ref 22–32)
Calcium: 6.7 mg/dL — ABNORMAL LOW (ref 8.9–10.3)
Chloride: 113 mmol/L — ABNORMAL HIGH (ref 98–111)
Creatinine, Ser: 2.66 mg/dL — ABNORMAL HIGH (ref 0.44–1.00)
GFR, Estimated: 17 mL/min — ABNORMAL LOW (ref >60)
Glucose, Bld: 116 mg/dL — ABNORMAL HIGH (ref 70–99)
Phosphorus: 4.2 mg/dL (ref 2.5–4.6)
Potassium: 3.8 mmol/L (ref 3.5–5.1)
Sodium: 141 mmol/L (ref 135–145)

## 2022-08-03 LAB — CALCIUM, IONIZED: Calcium, Ionized, Serum: 3.4 mg/dL — ABNORMAL LOW (ref 4.5–5.6)

## 2022-08-03 LAB — MAGNESIUM: Magnesium: 1.7 mg/dL (ref 1.7–2.4)

## 2022-08-03 MED ORDER — CALCIUM GLUCONATE-NACL 1-0.675 GM/50ML-% IV SOLN
1.0000 g | Freq: Two times a day (BID) | INTRAVENOUS | Status: AC
  Administered 2022-08-03 (×2): 1000 mg via INTRAVENOUS
  Filled 2022-08-03 (×2): qty 50

## 2022-08-03 MED ORDER — MAGNESIUM SULFATE 2 GM/50ML IV SOLN
2.0000 g | Freq: Once | INTRAVENOUS | Status: AC
  Administered 2022-08-03: 2 g via INTRAVENOUS
  Filled 2022-08-03: qty 50

## 2022-08-03 MED ORDER — LOPERAMIDE HCL 2 MG PO CAPS
2.0000 mg | ORAL_CAPSULE | ORAL | Status: DC | PRN
  Administered 2022-08-03 – 2022-08-04 (×2): 2 mg via ORAL
  Filled 2022-08-03 (×2): qty 1

## 2022-08-03 MED ORDER — SODIUM BICARBONATE 8.4 % IV SOLN
INTRAVENOUS | Status: DC
  Filled 2022-08-03: qty 1000
  Filled 2022-08-03 (×2): qty 150

## 2022-08-03 NOTE — Assessment & Plan Note (Signed)
Secondary to kidney disease.  Improved -Continue with bicarb infusion for another day per nephrology

## 2022-08-03 NOTE — TOC Initial Note (Signed)
Transition of Care [facility]) - Initial/Assessment Note    Patient Details  Name: [name] MRN: [id number] Date of Birth: [birth year]  Transition of Care [facility]) CM/SW Contact:    Tiburcio Bash, LCSW Phone Number: 08/03/2022, 2:16 PM  Clinical Narrative:                  Patient agreeable to [facility] services, reports being active with [facility] and would like to resume. Reports no dme needs she states she has all dme at home.    Expected Discharge Plan: [place] Barriers to Discharge: Continued Medical Work up   Patient Goals and CMS Choice Patient states their goals for this hospitalization and ongoing recovery are:: to go home CMS Medicare.gov Compare Post Acute Care list provided to:: Patient Choice offered to / list presented to : Patient  Expected Discharge Plan and Services Expected Discharge Plan: [place]       Living arrangements for the past 2 months: [place] Agency: [place] Date [place]: 08/03/22      Prior Living Arrangements/Services Living arrangements for the past 2 months: Single Family Home Lives with:: Self                   Activities of Daily Living Home Assistive Devices/Equipment: Environmental consultant (specify type) ADL Screening (condition at time of admission) Patient's cognitive ability adequate to safely complete daily activities?: Yes Is the patient deaf or have difficulty hearing?: No Does the patient have difficulty seeing, even when wearing glasses/contacts?: No Does the patient have difficulty concentrating, remembering, or making decisions?: No Patient able to express need for assistance with ADLs?: Yes Does the patient have difficulty dressing or bathing?: No Independently performs ADLs?: Yes (appropriate for developmental age) Does the patient have difficulty walking or climbing stairs?: No Weakness of Legs: None Weakness of  Arms/Hands: None  Permission Sought/Granted                  Emotional Assessment              Admission diagnosis:  Hypocalcemia [E83.51] Hypomagnesemia [E83.42] Uremia [N19] AKI (acute kidney injury) ([place]) [N17.9] Patient Active Problem List   Diagnosis Date Noted   Metabolic acidosis 69/62/9528   Uremia of renal origin 08/02/2022   Uremia 41/32/4401   Acute metabolic encephalopathy 02/72/5366   Chronic diastolic CHF (congestive heart failure) ([place]) 08/02/2022   Chronic bronchitis ([place]) 08/02/2022   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 08/02/2022   Dizziness s/p fall 08/02/2022   AKI (acute kidney injury) ([place]) 06/12/2022   Hypomagnesemia 06/12/2022   Hyperphosphatemia 06/12/2022   Pneumonia due to COVID-19 virus 06/10/2022   Hypocalcemia 06/10/2022   CKD (chronic kidney disease), stage IV ([place]) 06/10/2022   Dyslipidemia 06/10/2022   Depression 06/10/2022   Right shoulder pain 06/10/2022   Generalized weakness    Nausea vomiting and diarrhea    Hypothyroidism, acquired 09/01/2019   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/25/2019   Multinodular goiter 07/31/2019   Class 2 severe obesity with serious comorbidity in adult ([place]) 12/12/2018   Diabetes mellitus type 2 in obese ([place]) 09/04/2017   Bilateral lower extremity edema 09/04/2017   Gastroesophageal reflux disease without esophagitis 05/16/2017   Vomiting and diarrhea 12/29/2016  Enteritis 12/29/2016   Acute lower UTI 12/29/2016   Incomplete emptying of bladder 11/01/2016   Urge incontinence 07/14/2015   Acute renal failure superimposed on stage 4 chronic kidney disease ([place]) 08/06/2014   Renal mass, right 08/06/2014   Precordial pain 07/24/2014   Dyspnea 07/24/2014   Palpitations 07/24/2014   Essential hypertension 07/24/2014   Hyperlipidemia 07/24/2014   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

## 2022-08-03 NOTE — Progress Notes (Signed)
Progress Note   Patient: [name] WLK:[id number] DOB: 09/10/1941 DOA: 08/02/2022     1 DOS: the patient was seen and examined on 08/03/2022   Brief hospital course: Taken from H&P.   [name] is a 81 y.o. female with medical history significant for CKD 4, anemia of CKD, diastolic CHF, HTN, hypothyroidism, COPD, hospitalized from 9/9 to 06/21/2022 with vomiting and diarrhea attributed to [place] at which time she had severe hypocalcemia and hypomagnesemia which were replenished, who presents to the ED with a 2-week history of recurrent vomiting and diarrhea, associated with dizziness that led to a fall and near passing out episode about 3 days ago. She hit her head when she fell and thinks she might have passed out and since the fall had dizziness described as a spinning sensation when turning her head to the left. She also has shortness of breath, typical for her asthma/COPD. Denies chest pain. She feels like her nausea and diarrhea never completely resolve after her last admission. ED course and data review: BP 156/68, pulse 84 and respirations 26 with O2 sat 98% on room air.  Labs notable for creatinine of 3.47 up from 2.42 a month ago, with bicarb of 12, GFR 13 down from 20 a month ago.  Potassium 3.9.  Calcium 5.5 down from 8.1 on 10/5.  Magnesium less than 0.5 and phosphorus 5.9.  Troponin 18.  Hemoglobin 10.7, up from 8.7 on 9/20. EKG, personally viewed and interpreted showing sinus at 81 with no acute ST-T wave changes.  Patient treated with an LR bolus, calcium gluconate 1 g x 2, magnesium sulfate 2 g and started on a bicarb drip.  11/1: Some improvement to magnesium but remained low at 1.5 and calcium remain low at 6.  Further replacement ordered.  Patient appears alert and oriented x3.  Tolerated breakfast.  Denies any more vomiting or diarrhea.  Patient has history of chronic intermittent diarrhea. C. difficile and GI pathogen panel was negative. CT head with no evidence of acute  intracranial injury. Renal functions and bicarb improving.  11/2: BUN/creatinine continue to improve but still above baseline.  Corrected calcium remains low.  Nephrology would like to continue gentle IV fluids with bicarb infusion for another day. Also ordered more calcium and magnesium replacement. Nausea and vomiting resolved.  Able to tolerate diet.  Continue to have intermittent diarrhea. Imodium was added.  Assessment and Plan: * Acute metabolic encephalopathy Resolved. Electrolyte derangement with N/V/D, and uremia can be contributory. Now alert and oriented-appears to be at baseline. PT/OT are recommending home health.  Acute renal failure superimposed on stage 4 chronic kidney disease (HCC) Metabolic acidosis Possible early uremia AKI likely prerenal and related to ongoing vomiting and diarrhea as well as diuretics of torsemide and spironolactone for CHF treatment, initiated during recent hospitalization in September 2023 Patient presenting with recurring vomiting, diarrhea previously attributed to [place] as well as dizziness, shortness of breath Creatinine 3.47, up from baseline of 2.4 to now with GFR of 13 Started improving Continue bicarb infusion for another day Nephrology consulted for to follow.  Patient was recently being considered for initiation of dialysis and has an AV fistula  Dizziness s/p fall Patient fell on 10/30 and hit her head and says since then she feels the room spinning when she looks to the left. No prior history of vertigo Head CT was negative for any acute abnormality -PT is recommending home health  Hypomagnesemia Patient has severe hypomanic level less than 0.5 on  admission. Received multiple doses of magnesium, now improved. -Continue to monitor  Hypocalcemia Secondary hyperparathyroidism Patient is on daily calcitriol by nephrology Calcium still low after receiving 3 doses of calcium gluconate And another dose ordered. Continue to monitor  and replete as necessary  Vomiting and diarrhea Possible acute gastroenteritis versus uremic symptoms C. difficile and GI pathogen panel negative.  Intermittent chronic diarrhea. Now tolerating diet. -Monitor  Essential hypertension Continue amlodipine, hydralazine and metoprolol.  Holding spironolactone and torsemide for now due to AKI  OSA on CPAP CPAP  S/P total thyroidectomy Continue Synthroid  Depression Continue Zoloft  Metabolic acidosis Secondary to kidney disease.  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  Continues to have some intermittent diarrhea.  No abdominal pain.  Diarrhea seems chronic.  Able to tolerate diet.  Physical Exam: Vitals:   08/03/22 0518 08/03/22 0800 08/03/22 1200 08/03/22 1400  BP: (!) 127/55 (!) 144/54 (!) 117/46 (!) 112/43  Pulse: 66 74 63 69  Resp: 16 15 14 16   Temp: 98.3 F (36.8 C) 98.7 F (37.1 C) 98 F (36.7 C) 98.4 F (36.9 C)  TempSrc: Oral Oral Oral Oral  SpO2: 100% 97% 96% 98%  Weight:      Height:       General.  Frail elderly lady, in no acute  distress. Pulmonary.  Lungs clear bilaterally, normal respiratory effort. CV.  Regular rate and rhythm, no JVD, rub or murmur. Abdomen.  Soft, nontender, nondistended, BS positive. CNS.  Alert and oriented .  No focal neurologic deficit. Extremities.  No edema, no cyanosis, pulses intact and symmetrical. Psychiatry.  Judgment and insight appears normal.   Data Reviewed: Prior data reviewed  Family Communication: Discussed with husband at bedside  Disposition: Status is: Inpatient Remains inpatient appropriate because: Need IV fluid  Planned Discharge Destination: Home with Home Health  Time spent: 43 minutes  This record has been created using Systems analyst. Errors have been sought and corrected,but may not always be located. Such creation errors do not reflect on the standard of care.   Author: Lorella Nimrod, MD 08/03/2022 3:18 PM  For on call review [email or web address].

## 2022-08-03 NOTE — Evaluation (Signed)
Physical Therapy Evaluation Patient Details Name: [name] MRN: [id number] DOB: 20-Jul-1941 Today's Date: 08/03/2022  History of Present Illness  [name] is a 81 y.o. female with medical history significant for CKD 4, anemia of CKD, diastolic CHF, HTN, hypothyroidism, COPD, hospitalized from 9/9 to 06/21/2022 with vomiting and diarrhea attributed to [place] at which time she had severe hypocalcemia and hypomagnesemia which were replenished, who presents to the ED with a 2-week history of recurrent vomiting and diarrhea, associated with dizziness that led to a fall and near passing out episode about 3 days ago. She hit her head when she fell and thinks she might have passed out and since the fall had dizziness described as a spinning sensation when turning her head to the left. She also has shortness of breath, typical for her asthma/COPD. Denies chest pain. She feels like her nausea and diarrhea never did completely resolve after her last admission. During her recent past hospitalization, she had acute worsening of her chronic kidney disease and she had vascular access placed however dialysis was not initiated due to improving renal function.  She last saw her nephrologist on 10/16.  Patient presents to the ED again with dizziness, weakness and falls. Patient has been admitted for Uremia.  Clinical Impression  Pt received in bed agreeable to PT evaluation. Pt reported of getting dizzy spell intermittently with walking. Pt PLOF  is Ind with SPC at household level and community level activity participation. Today's assessment revealed pt is weak and able ot perform bed mobility with sup. Ambulated in room only due to diarrheas and weakness with CGA of 1 with FWW. Pt demonstrates fair+ dynamic balance with AD and good sitting balance. Pt's spouse is supportive. PT will continue in acute care and pt will benefit from HHPT, shower seat and grab bar after acute.      Recommendations for follow up therapy  are one component of a multi-disciplinary discharge planning process, led by the attending physician.  Recommendations may be updated based on patient status, additional functional criteria and insurance authorization.  Follow Up Recommendations Home health PT      Assistance Recommended at Discharge Intermittent Supervision/Assistance  Patient can return home with the following  A little help with walking and/or transfers;A little help with bathing/dressing/bathroom;Assistance with cooking/housework;Assist for transportation;Help with stairs or ramp for entrance    Equipment Recommendations  (grab bar and shower seat)  Recommendations for Other Services       Functional Status Assessment Patient has had a recent decline in their functional status and demonstrates the ability to make significant improvements in function in a reasonable and predictable amount of time.     Precautions / Restrictions Precautions Precautions: Fall Restrictions Weight Bearing Restrictions: No      Mobility  Bed Mobility Overal bed mobility: Needs Assistance Bed Mobility: Supine to Sit, Sit to Supine     Supine to sit: Supervision Sit to supine: Supervision        Transfers Overall transfer level: Needs assistance Equipment used: Rolling walker (2 wheels) Transfers: Sit to/from Stand Sit to Stand: Supervision                Ambulation/Gait Ambulation/Gait assistance: Min guard Gait Distance (Feet): 28 Feet Assistive device: Rolling walker (2 wheels) Gait Pattern/deviations: Step-through pattern, Decreased stride length Gait velocity: dec     General Gait Details: slow  Financial trader  Rankin (Stroke Patients Only)       Balance Overall balance assessment: Needs assistance Sitting-balance support: Feet supported Sitting balance-Leahy Scale: Good     Standing balance support: Bilateral upper extremity supported Standing  balance-Leahy Scale: Fair Standing balance comment: pt becomed dizzy due to diahrreas and vomiting.                             Pertinent Vitals/Pain Pain Assessment Pain Assessment: No/denies pain    Home Living Family/patient expects to be discharged to:: Private residence Living Arrangements: Spouse/significant other Available Help at Discharge: Family Type of Home: House Home Access: Stairs to enter Entrance Stairs-Rails: None Entrance Stairs-Number of Steps: 1   Home Layout: Laundry or work area in [place]: Conservation officer, nature (2 wheels);Cane - single point;BSC/3in1;Shower seat (shower seat is too big as per pt.) Additional Comments: shower seat too biug as per pt.    Prior Function Prior Level of Function : Independent/Modified Independent;Driving             Mobility Comments: independent ADLs Comments: patient reports she is independent with standing to shower and ADLs, driving but would like to use a grab bar and shower seat this time when she returns home.     Hand Dominance        Extremity/Trunk Assessment   Upper Extremity Assessment Upper Extremity Assessment: Generalized weakness    Lower Extremity Assessment Lower Extremity Assessment: Generalized weakness       Communication   Communication: No difficulties  Cognition Arousal/Alertness: Awake/alert Behavior During Therapy: WFL for tasks assessed/performed Overall Cognitive Status: Within Functional Limits for tasks assessed                                          General Comments      Exercises     Assessment/Plan    PT Assessment Patient needs continued PT services  PT Problem List Decreased strength;Decreased activity tolerance;Decreased balance;Decreased mobility       PT Treatment Interventions Gait training;Stair training;Functional mobility training;Therapeutic activities;Therapeutic exercise;Balance training;Neuromuscular  re-education;Patient/family education    PT Goals (Current goals can be found in the Care Plan section)  Acute Rehab PT Goals Patient Stated Goal: " I want to get better and go home." PT Goal Formulation: With patient Time For Goal Achievement: 08/17/22 Potential to Achieve Goals: Good    Frequency Min 2X/week     Co-evaluation               AM-PAC PT "6 Clicks" Mobility  Outcome Measure Help needed turning from your back to your side while in a flat bed without using bedrails?: A Little Help needed moving from lying on your back to sitting on the side of a flat bed without using bedrails?: A Little Help needed moving to and from a bed to a chair (including a wheelchair)?: A Little Help needed standing up from a chair using your arms (e.g., wheelchair or bedside chair)?: A Little Help needed to walk in hospital room?: A Little Help needed climbing 3-5 steps with a railing? : A Lot 6 Click Score: 17    End of Session Equipment Utilized During Treatment: Gait belt Activity Tolerance: Patient tolerated treatment well;Patient limited by fatigue;Treatment limited secondary to medical complications (Comment) Patient left: in bed;with call bell/phone within reach;with family/visitor present Nurse Communication:  Mobility status PT Visit Diagnosis: Unsteadiness on feet (R26.81);History of falling (Z91.81);Muscle weakness (generalized) (M62.81);Dizziness and giddiness (R42)    Time: 9094-0005 PT Time Calculation (min) (ACUTE ONLY): 35 min   Charges:   PT Evaluation $PT Eval Moderate Complexity: 1 Mod PT Treatments $Gait Training: 8-22 mins       Lestine Rahe PT DPT 12:15 PM,08/03/22

## 2022-08-03 NOTE — Progress Notes (Signed)
[facility]  ROUNDING NOTE   Subjective:   [name] is a 81 y.o. female with past medical history  Anemia, dCHF, hypertension, COPD, and chronic kidney disease stage 4. Patient presents to the ED with dizziness, weakness and falls. Patient has been admitted for Hypocalcemia [E83.51] Hypomagnesemia [E83.42] Uremia [N19] AKI (acute kidney injury) ([place]) [N17.9]  Patient is known to our practice and is followed outpatient by Dr Holley Raring.   Patient seen sitting at side of bed, husband at bedside Currently enjoying a light breakfast, soup Reports some nausea persist without vomiting Feels her fatigue is improving as well. Reports her feet are sore  Objective:  Vital signs in last 24 hours:  Temp:  [98 F (36.7 C)-98.7 F (37.1 C)] 98 F (36.7 C) (11/02 1200) Pulse Rate:  [63-85] 63 (11/02 1200) Resp:  [14-35] 14 (11/02 1200) BP: (116-144)/(44-58) 117/46 (11/02 1200) SpO2:  [92 %-100 %] 96 % (11/02 1200) Weight:  [71.3 kg] 71.3 kg (11/02 0500)  Weight change: -3.9 kg Filed Weights   08/02/22 0147 08/03/22 0500  Weight: 75.2 kg 71.3 kg    Intake/Output: I/O last 3 completed shifts: In: 1100.2 [IV Piggyback:1100.2] Out: -    Intake/Output this shift:  Total I/O In: 240 [P.O.:240] Out: 300 [Urine:300]  Physical Exam: General: NAD  Head: Normocephalic, atraumatic.  Dry oral mucosal membranes  Eyes: Anicteric  Lungs:  Clear to auscultation, normal effort, room air  Heart: Regular rate and rhythm  Abdomen:  Soft, nontender, obese  Extremities: No peripheral edema.  Neurologic: Nonfocal, moving all four extremities  Skin: No lesions  Access: None    Basic Metabolic Panel: Recent Labs  Lab 08/02/22 0157 08/02/22 1053 08/03/22 0836  NA 137 139 141  K 3.9 3.3* 3.8  CL 113* 112* 113*  CO2 12* 16* 17*  GLUCOSE 149* 160* 116*  BUN 90* 82* 72*  CREATININE 3.47* 2.98* 2.66*  CALCIUM 5.5* 6.0* 6.7*  MG <0.5* 1.5* 1.7  PHOS 5.9*  --  4.2      Liver Function Tests: Recent Labs  Lab 08/02/22 0157 08/03/22 0836  AST 27  --   ALT 25  --   ALKPHOS 51  --   BILITOT 0.7  --   PROT 7.5  --   ALBUMIN 3.7 3.5    No results for input(s): "LIPASE", "AMYLASE" in the last 168 hours. No results for input(s): "AMMONIA" in the last 168 hours.  CBC: Recent Labs  Lab 08/02/22 0157  WBC 8.8  HGB 10.7*  HCT 31.8*  MCV 84.4  PLT 193     Cardiac Enzymes: No results for input(s): "CKTOTAL", "CKMB", "CKMBINDEX", "TROPONINI" in the last 168 hours.  BNP: Invalid input(s): "POCBNP"  CBG: No results for input(s): "GLUCAP" in the last 168 hours.  Microbiology: Results for orders placed or performed during the hospital encounter of 08/02/22  C Difficile Quick Screen w PCR reflex     Status: None   Collection Time: 08/02/22 12:50 PM   Specimen: STOOL  Result Value Ref Range Status   C Diff antigen NEGATIVE NEGATIVE Final   C Diff toxin NEGATIVE NEGATIVE Final   C Diff interpretation No C. difficile detected.  Final    Comment: Performed at [facility], [address] [place], [place] [address]  Gastrointestinal Panel by PCR , Stool     Status: None   Collection Time: 08/02/22 12:50 PM   Specimen: STOOL  Result Value Ref Range Status   Campylobacter  species NOT DETECTED NOT DETECTED Final   Plesimonas shigelloides NOT DETECTED NOT DETECTED Final   Salmonella species NOT DETECTED NOT DETECTED Final   Yersinia enterocolitica NOT DETECTED NOT DETECTED Final   Vibrio species NOT DETECTED NOT DETECTED Final   Vibrio cholerae NOT DETECTED NOT DETECTED Final   Enteroaggregative E coli (EAEC) NOT DETECTED NOT DETECTED Final   Enteropathogenic E coli (EPEC) NOT DETECTED NOT DETECTED Final   Enterotoxigenic E coli (ETEC) NOT DETECTED NOT DETECTED Final   Shiga like toxin producing E coli (STEC) NOT DETECTED NOT DETECTED Final   Shigella/Enteroinvasive E coli (EIEC) NOT DETECTED NOT DETECTED Final   Cryptosporidium  NOT DETECTED NOT DETECTED Final   Cyclospora cayetanensis NOT DETECTED NOT DETECTED Final   Entamoeba histolytica NOT DETECTED NOT DETECTED Final   Giardia lamblia NOT DETECTED NOT DETECTED Final   Adenovirus F40/41 NOT DETECTED NOT DETECTED Final   Astrovirus NOT DETECTED NOT DETECTED Final   Norovirus GI/GII NOT DETECTED NOT DETECTED Final   Rotavirus A NOT DETECTED NOT DETECTED Final   Sapovirus (I, II, IV, and V) NOT DETECTED NOT DETECTED Final    Comment: Performed at [facility], [address] [place], [place] [address]    Coagulation Studies: No results for input(s): "LABPROT", "INR" in the last 72 hours.  Urinalysis: Recent Labs    08/02/22 0157  [place] 1.006  PHURINE 5.0  GLUCOSEU NEGATIVE  HGBUR NEGATIVE  BILIRUBINUR NEGATIVE  KETONESUR NEGATIVE  PROTEINUR NEGATIVE  NITRITE NEGATIVE  LEUKOCYTESUR SMALL*       Imaging: CT HEAD WO CONTRAST (5MM)  Result Date: 08/02/2022 CLINICAL DATA:  Minor head trauma. Nausea and vomiting for a month since COVID EXAM: CT HEAD WITHOUT CONTRAST TECHNIQUE: Contiguous axial images were obtained from the base of the skull through the vertex without intravenous contrast. RADIATION DOSE REDUCTION: This exam was performed according to the departmental dose-optimization program which includes automated exposure control, adjustment of the mA and/or kV according to patient size and/or use of iterative reconstruction technique. COMPARISON:  09/30/2015 FINDINGS: Brain: No evidence of acute infarction, hemorrhage, hydrocephalus, extra-axial collection or mass lesion/mass effect. Suspect interval lacunar infarct in the left thalamus, no history of focal deficit. Progressed but still mild for age chronic small vessel ischemia in the cerebral white matter. Age normal brain volume. Vascular: No hyperdense vessel or unexpected calcification. Skull: Normal. Negative for fracture or focal lesion. Sinuses/Orbits: Opacified  left ethmoid air cell with expansion, mucocele appearance but stable from 2016. IMPRESSION: 1. No evidence of intracranial injury. 2. Chronic small vessel ischemia that has progressed from 2016. 3. Focal left ethmoid sinus opacification with expansion and suspected mucocele but stable from 2016. Electronically Signed   By: Jorje Guild M.D.   On: 08/02/2022 05:55   DG Chest Port 1 View  Result Date: 08/02/2022 CLINICAL DATA:  Shortness of breath EXAM: PORTABLE CHEST 1 VIEW COMPARISON:  06/17/2022 FINDINGS: Chronic cardiomegaly. Stable aortic and hilar contours. There is no edema, consolidation, effusion, or pneumothorax. Artifact from EKG leads. IMPRESSION: No evidence of active disease. Electronically Signed   By: Jorje Guild M.D.   On: 08/02/2022 04:48     Medications:    calcium gluconate 1,000 mg (08/03/22 1153)   sodium bicarbonate 150 mEq in dextrose 5 % 1,150 mL infusion 50 mL/hr at 08/03/22 1236    amLODipine  5 mg Oral Daily   calcitRIOL  0.5 mcg Oral Daily   enoxaparin (LOVENOX) injection  30 mg Subcutaneous  Q24H   hydrALAZINE  100 mg Oral BID   levothyroxine  150 mcg Oral Q0600   metoprolol tartrate  50 mg Oral BID   mometasone-formoterol  2 puff Inhalation BID   pantoprazole  40 mg Oral QAC breakfast   rosuvastatin  40 mg Oral QHS   sertraline  100 mg Oral Daily   acetaminophen **OR** acetaminophen, albuterol, loperamide, ondansetron **OR** ondansetron (ZOFRAN) IV  Assessment/ Plan:  [name] is a 81 y.o.  female ith past medical history  Anemia, dCHF, hypertension, COPD, and chronic kidney disease stage 4. Patient presents to the ED with dizziness, weakness and falls. Patient has been admitted for Hypocalcemia [E83.51] Hypomagnesemia [E83.42] Uremia [N19] AKI (acute kidney injury) ([place]) [N17.9]   Acute Kidney Injury on chronic kidney disease stage IV with baseline creatinine 2.71 and GFR of 17 on 05/04/22.  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  Renal function is slightly better, creatinine 2.66.  IV fluids stopped, will reorder to offer hydration.  No acute need for dialysis at this time    Lab Results  Component Value Date   CREATININE 2.66 (H) 08/03/2022   CREATININE 2.98 (H) 08/02/2022   CREATININE 3.47 (H) 08/02/2022    Intake/Output Summary (Last 24 hours) at 08/03/2022 1336 Last data filed at 08/03/2022 1026 Gross per 24 hour  Intake 240 ml  Output 300 ml  Net -60 ml    2. Hypocalcemia/Hypomagnesia/Hypokalemia.  Calcium 5.5 on admission.  Potassium and magnesium corrected with supplementation.  Calcium slowly improving, 6.7.  Will order calcium gluconate 1 g now and this afternoon.  We will recheck calcium in a.m.  3. Anemia of chronic kidney disease Lab Results  Component Value Date   HGB 10.7 (L) 08/02/2022    Hemoglobin at goal.  4.  Hypertension with chronic kidney disease.  Home regimen includes amlodipine, hydralazine, metoprolol, spironolactone, and torsemide.  Currently receiving amlodipine hydralazine and metoprolol.  Blood pressure currently 117/46   LOS: [place] 11/2/20231:36 PM

## 2022-08-04 ENCOUNTER — Ambulatory Visit: Admission: RE | Admit: 2022-08-04 | Payer: Medicare Other | Source: Ambulatory Visit

## 2022-08-04 DIAGNOSIS — G9341 Metabolic encephalopathy: Secondary | ICD-10-CM | POA: Diagnosis not present

## 2022-08-04 LAB — RENAL FUNCTION PANEL
Albumin: 3 g/dL — ABNORMAL LOW (ref 3.5–5.0)
Anion gap: 10 (ref 5–15)
BUN: 63 mg/dL — ABNORMAL HIGH (ref 8–23)
CO2: 22 mmol/L (ref 22–32)
Calcium: 6.4 mg/dL — CL (ref 8.9–10.3)
Chloride: 104 mmol/L (ref 98–111)
Creatinine, Ser: 2.44 mg/dL — ABNORMAL HIGH (ref 0.44–1.00)
GFR, Estimated: 19 mL/min — ABNORMAL LOW (ref >60)
Glucose, Bld: 184 mg/dL — ABNORMAL HIGH (ref 70–99)
Phosphorus: 3.3 mg/dL (ref 2.5–4.6)
Potassium: 3.3 mmol/L — ABNORMAL LOW (ref 3.5–5.1)
Sodium: 136 mmol/L (ref 135–145)

## 2022-08-04 MED ORDER — POTASSIUM CHLORIDE CRYS ER 20 MEQ PO TBCR
20.0000 meq | EXTENDED_RELEASE_TABLET | Freq: Once | ORAL | Status: AC
  Administered 2022-08-04: 20 meq via ORAL
  Filled 2022-08-04: qty 1

## 2022-08-04 MED ORDER — CALCIUM GLUCONATE-NACL 2-0.675 GM/100ML-% IV SOLN
2.0000 g | Freq: Once | INTRAVENOUS | Status: AC
  Administered 2022-08-04: 2000 mg via INTRAVENOUS
  Filled 2022-08-04: qty 100

## 2022-08-04 NOTE — Assessment & Plan Note (Signed)
Possible acute gastroenteritis versus uremic symptoms C. difficile and GI pathogen panel negative.  Intermittent chronic diarrhea. Now tolerating diet. -Monitor

## 2022-08-04 NOTE — Care Management Important Message (Signed)
Important Message  Patient Details  Name: [name] MRN: [id number] Date of Birth: November 25, 1940   Medicare Important Message Given:  Yes     Dannette Barbara 08/04/2022, 11:32 AM

## 2022-08-04 NOTE — Progress Notes (Signed)
Physical Therapy Treatment Patient Details Name: [name] MRN: [id number] DOB: August 06, 1941 Today's Date: 08/04/2022   History of Present Illness [name] is a 81 y.o. female with medical history significant for CKD 4, anemia of CKD, diastolic CHF, HTN, hypothyroidism, COPD, hospitalized from 9/9 to 06/21/2022 with vomiting and diarrhea attributed to [place] at which time she had severe hypocalcemia and hypomagnesemia which were replenished, who presents to the ED with a 2-week history of recurrent vomiting and diarrhea, associated with dizziness that led to a fall and near passing out episode about 3 days ago. She hit her head when she fell and thinks she might have passed out and since the fall had dizziness described as a spinning sensation when turning her head to the left. She also has shortness of breath, typical for her asthma/COPD. Denies chest pain. She feels like her nausea and diarrhea never did completely resolve after her last admission. During her recent past hospitalization, she had acute worsening of her chronic kidney disease and she had vascular access placed however dialysis was not initiated due to improving renal function.  She last saw her nephrologist on 10/16.  Patient presents to the ED again with dizziness, weakness and falls. Patient has been admitted for Uremia.    PT Comments    Pt received in sitting on the EOB agreeable to  participate in PT interventions. Pt reported of feeling tired. Pt continues to have diarrhea. Pt received gait training with FWW with CGA of 1 with decreased velocity and VC for direction and Reflecting on RPE scale. Pt has progressed to independence with bed mobility. Pt  performed AROM to BLE to improve strength and endurance and stability. Pt advised to do the  same exs every 4 hours. Pt able to recall the exs well. Pt will benefit from HHPT after acute care.   Recommendations for follow up therapy are one component of a multi-disciplinary  discharge planning process, led by the attending physician.  Recommendations may be updated based on patient status, additional functional criteria and insurance authorization.  Follow Up Recommendations  Home health PT     Assistance Recommended at Discharge Intermittent Supervision/Assistance  Patient can return home with the following A little help with walking and/or transfers;A little help with bathing/dressing/bathroom;Assistance with cooking/housework;Assist for transportation;Help with stairs or ramp for entrance   Equipment Recommendations   (grab bar and shower seat)    Recommendations for Other Services       Precautions / Restrictions Precautions Precautions: Fall Restrictions Weight Bearing Restrictions: No     Mobility  Bed Mobility Overal bed mobility: Modified Independent                  Transfers Overall transfer level: Needs assistance Equipment used: Rolling walker (2 wheels) Transfers: Sit to/from Stand Sit to Stand: Supervision                Ambulation/Gait Ambulation/Gait assistance: Min guard Gait Distance (Feet): 76 Feet Assistive device: Rolling walker (2 wheels) Gait Pattern/deviations: Step-through pattern, Decreased stride length Gait velocity: dec     General Gait Details: slow   Stairs             Wheelchair Mobility    Modified Rankin (Stroke Patients Only)       Balance Overall balance assessment: Needs assistance Sitting-balance support: Feet supported Sitting balance-Leahy Scale: Normal     Standing balance support: Bilateral upper extremity supported Standing balance-Leahy Scale: Good Standing balance comment: pt becomed  dizzy due to diahrreas and vomiting.                            Cognition Arousal/Alertness: Awake/alert Behavior During Therapy: WFL for tasks assessed/performed Overall Cognitive Status: Within Functional Limits for tasks assessed                                           Exercises General Exercises - Lower Extremity Ankle Circles/Pumps: AROM, 10 reps, Seated Long Arc Quad: AROM, 20 reps, Seated Hip Flexion/Marching: AROM, 20 reps, Seated    General Comments        Pertinent Vitals/Pain Pain Assessment Pain Assessment: No/denies pain    Home Living                          Prior Function            PT Goals (current goals can now be found in the care plan section) Acute Rehab PT Goals PT Goal Formulation: With patient Time For Goal Achievement: 08/17/22 Potential to Achieve Goals: Good Progress towards PT goals: Progressing toward goals    Frequency    Min 2X/week      PT Plan Current plan remains appropriate    Co-evaluation              AM-PAC PT "6 Clicks" Mobility   Outcome Measure  Help needed turning from your back to your side while in a flat bed without using bedrails?: None Help needed moving from lying on your back to sitting on the side of a flat bed without using bedrails?: None Help needed moving to and from a bed to a chair (including a wheelchair)?: None Help needed standing up from a chair using your arms (e.g., wheelchair or bedside chair)?: A Little Help needed to walk in hospital room?: A Little Help needed climbing 3-5 steps with a railing? : A Lot 6 Click Score: 20    End of Session Equipment Utilized During Treatment: Gait belt Activity Tolerance: Patient tolerated treatment well;Patient limited by fatigue;Treatment limited secondary to medical complications (Comment) Patient left: in bed;with call bell/phone within reach;with family/visitor present Nurse Communication: Mobility status (Nurse informed about Pt asking  for Asthma meds) PT Visit Diagnosis: Unsteadiness on feet (R26.81);History of falling (Z91.81);Muscle weakness (generalized) (M62.81);Dizziness and giddiness (R42)     Time: 1200-1210 PT Time Calculation (min) (ACUTE ONLY): 10 min  Charges:   $Gait Training: 8-22 mins                    Joaquin Music PT DPT 12:55 PM,08/04/22

## 2022-08-04 NOTE — Assessment & Plan Note (Signed)
Secondary hyperparathyroidism Patient is on daily calcitriol by nephrology Calcium still low after receiving 4 doses of calcium gluconate, corrected calcium of 7.2 today. 2 g of calcium gluconate ordered today Nephrology would like to see calcium at 8 before discharge Will need to increase p.o. calcium supplement Continue to monitor and replete as necessary

## 2022-08-04 NOTE — Progress Notes (Signed)
Progress Note   Patient: [name] Proch WYO:[id number] DOB: 1941/04/28 DOA: 08/02/2022     2 DOS: the patient was seen and examined on 08/04/2022   Brief hospital course: Taken from H&P.   [name] is a 81 y.o. female with medical history significant for CKD 4, anemia of CKD, diastolic CHF, HTN, hypothyroidism, COPD, hospitalized from 9/9 to 06/21/2022 with vomiting and diarrhea attributed to [place] at which time she had severe hypocalcemia and hypomagnesemia which were replenished, who presents to the ED with a 2-week history of recurrent vomiting and diarrhea, associated with dizziness that led to a fall and near passing out episode about 3 days ago. She hit her head when she fell and thinks she might have passed out and since the fall had dizziness described as a spinning sensation when turning her head to the left. She also has shortness of breath, typical for her asthma/COPD. Denies chest pain. She feels like her nausea and diarrhea never completely resolve after her last admission. ED course and data review: BP 156/68, pulse 84 and respirations 26 with O2 sat 98% on room air.  Labs notable for creatinine of 3.47 up from 2.42 a month ago, with bicarb of 12, GFR 13 down from 20 a month ago.  Potassium 3.9.  Calcium 5.5 down from 8.1 on 10/5.  Magnesium less than 0.5 and phosphorus 5.9.  Troponin 18.  Hemoglobin 10.7, up from 8.7 on 9/20. EKG, personally viewed and interpreted showing sinus at 81 with no acute ST-T wave changes.  Patient treated with an LR bolus, calcium gluconate 1 g x 2, magnesium sulfate 2 g and started on a bicarb drip.  11/1: Some improvement to magnesium but remained low at 1.5 and calcium remain low at 6.  Further replacement ordered.  Patient appears alert and oriented x3.  Tolerated breakfast.  Denies any more vomiting or diarrhea.  Patient has history of chronic intermittent diarrhea. C. difficile and GI pathogen panel was negative. CT head with no evidence of acute  intracranial injury. Renal functions and bicarb improving.  11/2: BUN/creatinine continue to improve but still above baseline.  Corrected calcium remains low.  Nephrology would like to continue gentle IV fluids with bicarb infusion for another day. Also ordered more calcium and magnesium replacement. Nausea and vomiting resolved.  Able to tolerate diet.  Continue to have intermittent diarrhea. Imodium was added.  Assessment and Plan: * Acute metabolic encephalopathy Resolved. Electrolyte derangement with N/V/D, and uremia can be contributory. Now alert and oriented-appears to be at baseline. PT/OT are recommending home health.  Acute renal failure superimposed on stage 4 chronic kidney disease (HCC) Metabolic acidosis Possible early uremia AKI likely prerenal and related to ongoing vomiting and diarrhea as well as diuretics of torsemide and spironolactone for CHF treatment, initiated during recent hospitalization in September 2023 Patient presenting with recurring vomiting, diarrhea previously attributed to [name] as well as dizziness, shortness of breath Creatinine 3.47, up from baseline of 2.4 to now with GFR of 13 Started improving, close to baseline now Continue bicarb infusion for another day Nephrology consulted for to follow.  Patient was recently being considered for initiation of dialysis and has an AV fistula  Dizziness s/p fall Patient fell on 10/30 and hit her head and says since then she feels the room spinning when she looks to the left. No prior history of vertigo Head CT was negative for any acute abnormality -PT is recommending home health  Hypomagnesemia Patient has severe hypomanic level  less than 0.5 on admission. Received multiple doses of magnesium, now improved. -Continue to monitor  Hypocalcemia Secondary hyperparathyroidism Patient is on daily calcitriol by nephrology Calcium still low after receiving 4 doses of calcium gluconate, corrected calcium of 7.2  today. 2 g of calcium gluconate ordered today Nephrology would like to see calcium at 8 before discharge Will need to increase p.o. calcium supplement Continue to monitor and replete as necessary  Vomiting and diarrhea Possible acute gastroenteritis versus uremic symptoms C. difficile and GI pathogen panel negative.  Intermittent chronic diarrhea. Now tolerating diet. -Monitor  Essential hypertension Continue amlodipine, hydralazine and metoprolol.  Holding spironolactone and torsemide for now due to AKI  OSA on CPAP CPAP  S/P total thyroidectomy Continue Synthroid  Depression Continue Zoloft  Metabolic acidosis Secondary to kidney disease.  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  Having some intermittent nausea but no vomiting.  Able to tolerate diet.   Physical Exam: Vitals:   08/04/22 0343 08/04/22 0500 08/04/22 0801 08/04/22 1147  BP: (!) 122/45  (!) 155/58 (!) 128/49  Pulse: 66  74 72  Resp: 20  20 20   Temp: 98.5 F (36.9 C)  98.1 F (36.7 C) 98.2 F (36.8 C)  TempSrc:   Oral Oral  SpO2: 96%   97% 94%  Weight:  72.1 kg    Height:       General.  Frail elderly lady, in no acute distress. Pulmonary.  Lungs clear bilaterally, normal respiratory effort. CV.  Regular rate and rhythm, no JVD, rub or murmur. Abdomen.  Soft, nontender, nondistended, BS positive. CNS.  Alert and oriented .  No focal neurologic deficit. Extremities.  No edema, no cyanosis, pulses intact and symmetrical. Psychiatry.  Judgment and insight appears normal.   Data Reviewed: Prior data reviewed  Family Communication: Discussed with husband at bedside  Disposition: Status is: Inpatient Remains inpatient appropriate because: Need IV fluid  Planned Discharge Destination: Home with Home Health  Time spent: 42 minutes  This record has been created using Systems analyst. Errors have been sought and corrected,but may not always be located. Such creation errors do not reflect on the standard of care.   Author: Lorella Nimrod, MD 08/04/2022 2:59 PM  For on call review [email or web address].

## 2022-08-04 NOTE — Assessment & Plan Note (Signed)
Metabolic acidosis Possible early uremia AKI likely prerenal and related to ongoing vomiting and diarrhea as well as diuretics of torsemide and spironolactone for CHF treatment, initiated during recent hospitalization in September 2023 Patient presenting with recurring vomiting, diarrhea previously attributed to [place] as well as dizziness, shortness of breath Creatinine 3.47, up from baseline of 2.4 to now with GFR of 13 Started improving, close to baseline now Continue bicarb infusion for another day Nephrology consulted for to follow.  Patient was recently being considered for initiation of dialysis and has an AV fistula

## 2022-08-04 NOTE — Progress Notes (Signed)
[facility]  ROUNDING NOTE   Subjective:   [name] is a 81 y.o. female with past medical history  Anemia, dCHF, hypertension, COPD, and chronic kidney disease stage 4. Patient presents to the ED with dizziness, weakness and falls. Patient has been admitted for Hypocalcemia [E83.51] Hypomagnesemia [E83.42] Uremia [N19] AKI (acute kidney injury) ([place]) [N17.9]  Patient is known to our practice and is followed outpatient by Dr Holley Raring.   Patient seen sitting at side of bed, husband at bedside Patient states appetite remains poor but nausea slowly improving. Patient complains of diarrhea.  Patient states she normally experiences diarrhea at least 3 episodes daily outpatient.  She states she feels it has increased over the past couple weeks.  Calcium 6.4  Objective:  Vital signs in last 24 hours:  Temp:  [97.7 F (36.5 C)-98.5 F (36.9 C)] 98.2 F (36.8 C) (11/03 1147) Pulse Rate:  [64-74] 72 (11/03 1147) Resp:  [14-22] 20 (11/03 1147) BP: (118-155)/(45-58) 128/49 (11/03 1147) SpO2:  [94 %-97 %] 94 % (11/03 1147) Weight:  [72.1 kg] 72.1 kg (11/03 0500)  Weight change: 0.8 kg Filed Weights   08/02/22 0147 08/03/22 0500 08/04/22 0500  Weight: 75.2 kg 71.3 kg 72.1 kg    Intake/Output: I/O last 3 completed shifts: In: 240 [P.O.:240] Out: 300 [Urine:300]   Intake/Output this shift:  Total I/O In: 480 [P.O.:480] Out: -   Physical Exam: General: NAD  Head: Normocephalic, atraumatic.  Dry oral mucosal membranes  Eyes: Anicteric  Lungs:  Clear to auscultation, normal effort, room air  Heart: Regular rate and rhythm  Abdomen:  Soft, nontender, obese  Extremities: No peripheral edema.  Neurologic: Nonfocal, moving all four extremities  Skin: No lesions  Access: None    Basic Metabolic Panel: Recent Labs  Lab 08/02/22 0157 08/02/22 1053 08/03/22 0836 08/04/22 0445  NA 137 139 141 136  K 3.9 3.3* 3.8 3.3*  CL 113* 112* 113* 104  CO2 12* 16* 17* 22   GLUCOSE 149* 160* 116* 184*  BUN 90* 82* 72* 63*  CREATININE 3.47* 2.98* 2.66* 2.44*  CALCIUM 5.5* 6.0* 6.7* 6.4*  MG <0.5* 1.5* 1.7  --   PHOS 5.9*  --  4.2 3.3     Liver Function Tests: Recent Labs  Lab 08/02/22 0157 08/03/22 0836 08/04/22 0445  AST 27  --   --   ALT 25  --   --   ALKPHOS 51  --   --   BILITOT 0.7  --   --   PROT 7.5  --   --   ALBUMIN 3.7 3.5 3.0*    No results for input(s): "LIPASE", "AMYLASE" in the last 168 hours. No results for input(s): "AMMONIA" in the last 168 hours.  CBC: Recent Labs  Lab 08/02/22 0157  WBC 8.8  HGB 10.7*  HCT 31.8*  MCV 84.4  PLT 193     Cardiac Enzymes: No results for input(s): "CKTOTAL", "CKMB", "CKMBINDEX", "TROPONINI" in the last 168 hours.  BNP: Invalid input(s): "POCBNP"  CBG: No results for input(s): "GLUCAP" in the last 168 hours.  Microbiology: Results for orders placed or performed during the hospital encounter of 08/02/22  C Difficile Quick Screen w PCR reflex     Status: None   Collection Time: 08/02/22 12:50 PM   Specimen: STOOL  Result Value Ref Range Status   C Diff antigen NEGATIVE NEGATIVE Final   C Diff toxin NEGATIVE NEGATIVE Final   C Diff interpretation No C.  difficile detected.  Final    Comment: Performed at [facility], [address] [place], [place] [address]  Gastrointestinal Panel by PCR , Stool     Status: None   Collection Time: 08/02/22 12:50 PM   Specimen: STOOL  Result Value Ref Range Status   Campylobacter species NOT DETECTED NOT DETECTED Final   Plesimonas shigelloides NOT DETECTED NOT DETECTED Final   Salmonella species NOT DETECTED NOT DETECTED Final   Yersinia enterocolitica NOT DETECTED NOT DETECTED Final   Vibrio species NOT DETECTED NOT DETECTED Final   Vibrio cholerae NOT DETECTED NOT DETECTED Final   Enteroaggregative E coli (EAEC) NOT DETECTED NOT DETECTED Final   Enteropathogenic E coli (EPEC) NOT DETECTED NOT DETECTED Final   Enterotoxigenic E  coli (ETEC) NOT DETECTED NOT DETECTED Final   Shiga like toxin producing E coli (STEC) NOT DETECTED NOT DETECTED Final   Shigella/Enteroinvasive E coli (EIEC) NOT DETECTED NOT DETECTED Final   Cryptosporidium NOT DETECTED NOT DETECTED Final   Cyclospora cayetanensis NOT DETECTED NOT DETECTED Final   Entamoeba histolytica NOT DETECTED NOT DETECTED Final   Giardia lamblia NOT DETECTED NOT DETECTED Final   Adenovirus F40/41 NOT DETECTED NOT DETECTED Final   Astrovirus NOT DETECTED NOT DETECTED Final   Norovirus GI/GII NOT DETECTED NOT DETECTED Final   Rotavirus A NOT DETECTED NOT DETECTED Final   Sapovirus (I, II, IV, and V) NOT DETECTED NOT DETECTED Final    Comment: Performed at [facility], [address] [place],  [address]    Coagulation Studies: No results for input(s): "LABPROT", "INR" in the last 72 hours.  Urinalysis: Recent Labs    08/02/22 0157  COLORURINE YELLOW*  LABSPEC 1.006  PHURINE 5.0  GLUCOSEU NEGATIVE  HGBUR NEGATIVE  BILIRUBINUR NEGATIVE  KETONESUR NEGATIVE  PROTEINUR NEGATIVE  NITRITE NEGATIVE  LEUKOCYTESUR SMALL*       Imaging: No results found.   Medications:    sodium bicarbonate 150 mEq in dextrose 5 % 1,150 mL infusion 50 mL/hr at 08/03/22 1236    amLODipine  5 mg Oral Daily   calcitRIOL  0.5 mcg Oral Daily   enoxaparin (LOVENOX) injection  30 mg Subcutaneous Q24H   hydrALAZINE  100 mg Oral BID   levothyroxine  150 mcg Oral Q0600   metoprolol tartrate  50 mg Oral BID   mometasone-formoterol  2 puff Inhalation BID   pantoprazole  40 mg Oral QAC breakfast   rosuvastatin  40 mg Oral QHS   sertraline  100 mg Oral Daily   acetaminophen **OR** acetaminophen, albuterol, loperamide, ondansetron **OR** ondansetron (ZOFRAN) IV  Assessment/ Plan:  [name] is a 81 y.o.  female ith past medical history  Anemia, dCHF, hypertension, COPD, and chronic kidney disease stage 4. Patient presents to the ED with dizziness,  weakness and falls. Patient has been admitted for Hypocalcemia [E83.51] Hypomagnesemia [E83.42] Uremia [N19] AKI (acute kidney injury) ([place]) [N17.9]   Acute Kidney Injury on chronic kidney disease stage IV with baseline creatinine 2.71 and GFR of 17 on 05/04/22.  Acute kidney injury secondary to hypovolemia from poor oral intake Chronic kidney disease is secondary to diabetes, proteinuria, and positive ANA.   Creatinine continues to slowly improve.  No acute need for dialysis.  Patient will need to follow-up in our office at discharge.    Lab Results  Component Value Date   CREATININE 2.44 (H) 08/04/2022   CREATININE 2.66 (H) 08/03/2022   CREATININE 2.98 (H) 08/02/2022  Intake/Output Summary (Last 24 hours) at 08/04/2022 1440 Last data filed at 08/04/2022 1300 Gross per 24 hour  Intake 480 ml  Output --  Net 480 ml    2. Hypocalcemia/Hypomagnesia/Hypokalemia.  Calcium 5.5 on admission,  likely due to GI losses.  Magnesium corrected with supplementation.  Potassium slightly decreased at 3.3 today, supplementation ordered.  Calcium decreased today, 6.4.  Primary team has ordered calcium gluconate 2 g.  Will recommend calcium level of 8 prior to considering discharge.  3. Anemia of chronic kidney disease Lab Results  Component Value Date   HGB 10.7 (L) 08/02/2022    Hemoglobin remains within desired range.  4.  Hypertension with chronic kidney disease.  Home regimen includes amlodipine, hydralazine, metoprolol, spironolactone, and torsemide.  Currently receiving amlodipine hydralazine and metoprolol.  Blood pressure stable for this patient.   LOS: 2 [place] 11/3/20232:40 PM

## 2022-08-05 DIAGNOSIS — G9341 Metabolic encephalopathy: Secondary | ICD-10-CM | POA: Diagnosis not present

## 2022-08-05 DIAGNOSIS — E213 Hyperparathyroidism, unspecified: Secondary | ICD-10-CM | POA: Insufficient documentation

## 2022-08-05 LAB — RENAL FUNCTION PANEL
Albumin: 3 g/dL — ABNORMAL LOW (ref 3.5–5.0)
Anion gap: 8 (ref 5–15)
BUN: 56 mg/dL — ABNORMAL HIGH (ref 8–23)
CO2: 23 mmol/L (ref 22–32)
Calcium: 7.6 mg/dL — ABNORMAL LOW (ref 8.9–10.3)
Chloride: 106 mmol/L (ref 98–111)
Creatinine, Ser: 2.31 mg/dL — ABNORMAL HIGH (ref 0.44–1.00)
GFR, Estimated: 21 mL/min — ABNORMAL LOW (ref >60)
Glucose, Bld: 117 mg/dL — ABNORMAL HIGH (ref 70–99)
Phosphorus: 2.8 mg/dL (ref 2.5–4.6)
Potassium: 4.1 mmol/L (ref 3.5–5.1)
Sodium: 137 mmol/L (ref 135–145)

## 2022-08-05 LAB — MAGNESIUM: Magnesium: 1.7 mg/dL (ref 1.7–2.4)

## 2022-08-05 MED ORDER — CALCITRIOL 0.5 MCG PO CAPS: 0.5000 ug | ORAL_CAPSULE | Freq: Two times a day (BID) | ORAL | 1 refills | Status: DC

## 2022-08-05 MED ORDER — CALCITRIOL 0.25 MCG PO CAPS
0.5000 ug | ORAL_CAPSULE | Freq: Two times a day (BID) | ORAL | Status: DC
  Administered 2022-08-05: 0.5 ug via ORAL
  Filled 2022-08-05: qty 2

## 2022-08-05 MED ORDER — MAGNESIUM SULFATE 2 GM/50ML IV SOLN
2.0000 g | Freq: Once | INTRAVENOUS | Status: AC
  Administered 2022-08-05: 2 g via INTRAVENOUS
  Filled 2022-08-05: qty 50

## 2022-08-05 MED ORDER — LOPERAMIDE HCL 2 MG PO CAPS: 2.0000 mg | ORAL_CAPSULE | ORAL | 0 refills | Status: DC | PRN

## 2022-08-05 NOTE — TOC Transition Note (Signed)
Transition of Care [facility]) - CM/SW Discharge Note   Patient Details  Name: [name] MRN: [id number] Date of Birth: [birth year]  [facility] CM/SW Contact:  Magnus Ivan, LCSW Phone Number: 08/05/2022, 11:34 AM   Clinical Narrative:    Notified Cheryl with [facility] that patient will DC home today. HH orders are in.    Final next level of care: [place] Barriers to Discharge: Barriers Resolved   Patient Goals and CMS Choice Patient states their goals for this hospitalization and ongoing recovery are:: to go home CMS Medicare.gov Compare Post Acute Care list provided to:: Patient Choice offered to / list presented to : Patient  Discharge Placement                       Discharge Plan and Services                          HH Arranged: PT, RN, Nurse's Aide [facility] Agency: [place] Date [place]: 08/05/22   Representative spoke with at [place]: Malachy Mood  Social Determinants of Health ([place]) Interventions     Readmission Risk Interventions     No data to display

## 2022-08-05 NOTE — Plan of Care (Signed)
  Problem: Education: Goal: Knowledge of General Education information will improve Description: Including pain rating scale, medication(s)/side effects and non-pharmacologic comfort measures Outcome: Progressing   Problem: Health Behavior/Discharge Planning: Goal: Ability to manage health-related needs will improve Outcome: Progressing   Problem: Clinical Measurements: Goal: Cardiovascular complication will be avoided Outcome: Progressing   Problem: Activity: Goal: Risk for activity intolerance will decrease Outcome: Progressing   Problem: Nutrition: Goal: Adequate nutrition will be maintained Outcome: Progressing   Problem: Coping: Goal: Level of anxiety will decrease Outcome: Progressing   Problem: Elimination: Goal: Will not experience complications related to urinary retention Outcome: Progressing   Problem: Pain Managment: Goal: General experience of comfort will improve Outcome: Progressing   Problem: Safety: Goal: Ability to remain free from injury will improve Outcome: Progressing   Problem: Skin Integrity: Goal: Risk for impaired skin integrity will decrease Outcome: Progressing

## 2022-08-05 NOTE — Discharge Instructions (Signed)
Please follow-up with your kidney doctor within a week for further recommendations. We increased the dose of calcitriol from daily to twice daily, start taking according to the new prescription. Keep yourself well-hydrated

## 2022-08-05 NOTE — Progress Notes (Signed)
[facility]  PROGRESS NOTE   Subjective:   Patient seen at bedside.  Feels much better. Family at bedside.  Objective:  Vital signs: Blood pressure (!) 150/57, pulse 72, temperature 98.3 F (36.8 C), resp. rate 18, height 5' 2"  (1.575 m), weight 82.7 kg, SpO2 94 %.  Intake/Output Summary (Last 24 hours) at 08/05/2022 1248 Last data filed at 08/05/2022 1029 Gross per 24 hour  Intake 1227.97 ml  Output --  Net 1227.97 ml   Filed Weights   08/03/22 0500 08/04/22 0500 08/05/22 0500  Weight: 71.3 kg 72.1 kg 82.7 kg     Physical Exam: General:  No acute distress  Head:  Normocephalic, atraumatic. Moist oral mucosal membranes  Eyes:  Anicteric  Neck:  Supple  Lungs:   Clear to auscultation, normal effort  Heart:  S1S2 no rubs  Abdomen:   Soft, nontender, bowel sounds present  Extremities:  peripheral edema.  Neurologic:  Awake, alert, following commands  Skin:  No lesions  Access:     Basic Metabolic Panel: Recent Labs  Lab 08/02/22 0157 08/02/22 1053 08/03/22 0836 08/04/22 0445 08/05/22 0539  NA 137 139 141 136 137  K 3.9 3.3* 3.8 3.3* 4.1  CL 113* 112* 113* 104 106  CO2 12* 16* 17* 22 23  GLUCOSE 149* 160* 116* 184* 117*  BUN 90* 82* 72* 63* 56*  CREATININE 3.47* 2.98* 2.66* 2.44* 2.31*  CALCIUM 5.5* 6.0* 6.7* 6.4* 7.6*  MG <0.5* 1.5* 1.7  --  1.7  PHOS 5.9*  --  4.2 3.3 2.8    CBC: Recent Labs  Lab 08/02/22 0157  WBC 8.8  HGB 10.7*  HCT 31.8*  MCV 84.4  PLT 193     Urinalysis: No results for input(s): "COLORURINE", "LABSPEC", "PHURINE", "GLUCOSEU", "HGBUR", "BILIRUBINUR", "KETONESUR", "PROTEINUR", "UROBILINOGEN", "NITRITE", "LEUKOCYTESUR" in the last 72 hours.  Invalid input(s): "APPERANCEUR"    Imaging: No results found.   Medications:    sodium bicarbonate 150 mEq in dextrose 5 % 1,150 mL infusion 50 mL/hr at 08/05/22 1610    amLODipine  5 mg Oral Daily   calcitRIOL  0.5 mcg Oral BID   enoxaparin (LOVENOX) injection  30  mg Subcutaneous Q24H   hydrALAZINE  100 mg Oral BID   levothyroxine  150 mcg Oral Q0600   metoprolol tartrate  50 mg Oral BID   mometasone-formoterol  2 puff Inhalation BID   pantoprazole  40 mg Oral QAC breakfast   rosuvastatin  40 mg Oral QHS   sertraline  100 mg Oral Daily    Assessment/ Plan:     Principal Problem:   Acute metabolic encephalopathy Active Problems:   Essential hypertension   Vomiting and diarrhea   Acute renal failure superimposed on stage 4 chronic kidney disease (HCC)   OSA on CPAP   S/P total thyroidectomy   Hypocalcemia   Depression   Hypomagnesemia   Metabolic acidosis   Chronic diastolic CHF (congestive heart failure) (HCC)   Chronic bronchitis (HCC)   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  She is also found to have hypocalcemia and hypomagnesemia.  #1: Acute kidney injury: Renal indices are back to baseline.  #2: Hypocalcemia: Calcium levels have significantly improved.    #3: Secondary parathyroidism: We will continue the calcitriol at the present doses.  Spoke to the family at bedside in detail and answered all their questions to their satisfaction.  Stable for discharge from renal standpoint.   LOS: [place], MD [facility] 11/4/202312:48 PM

## 2022-08-05 NOTE — Discharge Summary (Signed)
Physician Discharge Summary   Patient: [name] MRN: [id number] DOB: 07-15-1941  Admit date:     08/02/2022  Discharge date: 08/05/22  Discharge Physician: Lorella Nimrod   PCP: Kirk Ruths, MD   Recommendations at discharge:  Please obtain renal function within a week Follow-up with primary care provider Follow-up with nephrology  Discharge Diagnoses: Principal Problem:   Acute metabolic encephalopathy Active Problems:   Acute renal failure superimposed on stage 4 chronic kidney disease ([place])   Dizziness s/p fall   Hypomagnesemia   Hypocalcemia   Vomiting and diarrhea   Essential hypertension   OSA on CPAP   S/P total thyroidectomy   Depression   Metabolic acidosis   Chronic diastolic CHF (congestive heart failure) (HCC)   Chronic bronchitis ([place])   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   [name] is a 81 y.o. female with medical history significant for CKD 4, anemia of CKD, diastolic CHF, HTN, hypothyroidism, COPD, hospitalized from 9/9 to 06/21/2022 with vomiting and diarrhea attributed to [place] at which time she had severe hypocalcemia and hypomagnesemia which were replenished, who presents to the ED with a 2-week history of recurrent vomiting and diarrhea, associated with dizziness that led to a fall and near passing out episode about 3 days ago. She hit her head when she fell and thinks she might have passed out and since the fall had dizziness described as a spinning sensation when turning her head to the left. She also has shortness of breath, typical for her asthma/COPD. Denies chest pain. She feels like her nausea and diarrhea never completely resolve after her last admission. ED course and data review: BP 156/68, pulse 84 and respirations 26 with O2 sat 98% on room air.  Labs notable for creatinine of 3.47 up from 2.42 a month ago, with bicarb of 12, GFR 13 down from 20 a month ago.  Potassium 3.9.  Calcium 5.5  down from 8.1 on 10/5.  Magnesium less than 0.5 and phosphorus 5.9.  Troponin 18.  Hemoglobin 10.7, up from 8.7 on 9/20. EKG, personally viewed and interpreted showing sinus at 81 with no acute ST-T wave changes.  Patient treated with an LR bolus, calcium gluconate 1 g x 2, magnesium sulfate 2 g and started on a bicarb drip.  11/1: Some improvement to magnesium but remained low at 1.5 and calcium remain low at 6.  Further replacement ordered.  Patient appears alert and oriented x3.  Tolerated breakfast.  Denies any more vomiting or diarrhea.  Patient has history of chronic intermittent diarrhea. C. difficile and GI pathogen panel was negative. CT head with no evidence of acute intracranial injury. Renal functions and bicarb improving.  11/2: BUN/creatinine continue to improve but still above baseline.  Corrected calcium remains low.  Nephrology would like to continue gentle IV fluids with bicarb infusion for another day. Also ordered more calcium and magnesium replacement. Nausea and vomiting resolved.  Able to tolerate diet.  Continue to have intermittent diarrhea. Imodium was added.  11/3: Corrected calcium at 7.3, renal function continues to improve, mild hypokalemia with potassium of 3.3.  Calcium is being repleted again.  Nephrology wants calcium to be at least at 8 before discharge.  PT is recommending home health which were ordered.  11/4: Corrected calcium improved to 8.4, renal functions continue to improve with creatinine of 1, acidosis resolved.  Magnesium at 1.7.  Repleting both magnesium and increasing home dose  of calcitriol from daily to twice daily. Patient did not had any nausea, vomiting or diarrhea today.  Able to tolerate diet well. She was instructed to keep herself well-hydrated.  She will continue with current medications and need to have a close follow-up with her providers for further recommendations.  Assessment and Plan: * Acute metabolic encephalopathy Resolved.  Electrolyte derangement with N/V/D, and uremia can be contributory. Now alert and oriented-appears to be at baseline. PT/OT are recommending home health.  Acute renal failure superimposed on stage 4 chronic kidney disease (HCC) Metabolic acidosis Possible early uremia AKI likely prerenal and related to ongoing vomiting and diarrhea as well as diuretics of torsemide and spironolactone for CHF treatment, initiated during recent hospitalization in September 2023 Patient presenting with recurring vomiting, diarrhea previously attributed to [place] as well as dizziness, shortness of breath Creatinine 3.47, up from baseline of 2.4 to now with GFR of 13 Started improving, close to baseline now Continue bicarb infusion for another day Nephrology consulted for to follow.  Patient was recently being considered for initiation of dialysis and has an AV fistula  Dizziness s/p fall Patient fell on 10/30 and hit her head and says since then she feels the room spinning when she looks to the left. No prior history of vertigo Head CT was negative for any acute abnormality -PT is recommending home health  Hypomagnesemia Patient has severe hypomanic level less than 0.5 on admission. Received multiple doses of magnesium, now improved. -Continue to monitor  Hypocalcemia Secondary hyperparathyroidism Patient is on daily calcitriol by nephrology Calcium still low after receiving 4 doses of calcium gluconate, corrected calcium of 7.2 today. 2 g of calcium gluconate ordered today Nephrology would like to see calcium at 8 before discharge Will need to increase p.o. calcium supplement Continue to monitor and replete as necessary  Vomiting and diarrhea Possible acute gastroenteritis versus uremic symptoms C. difficile and GI pathogen panel negative.  Intermittent chronic diarrhea. Now tolerating diet. -Monitor  Essential hypertension Continue amlodipine, hydralazine and metoprolol.  Holding spironolactone  and torsemide for now due to AKI  OSA on CPAP CPAP  S/P total thyroidectomy Continue Synthroid  Depression Continue Zoloft  Metabolic acidosis Secondary to kidney disease.  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 Hemoglobin stable    Latest Ref Rng & Units 08/02/2022    1:57 AM 06/21/2022    7:05 AM 06/20/2022    4:07 AM  CBC  WBC 4.0 - 10.5 K/uL 8.8  9.4  9.4   Hemoglobin 12.0 - 15.0 g/dL 10.7  8.7  8.8   Hematocrit 36.0 - 46.0 % 31.8  27.2  27.4   Platelets 150 - 400 K/uL 193  179  194      Hyperphosphatemia Defer to nephrology     Consultants: Nephrology Procedures performed: None Disposition: Home Diet recommendation:  Discharge Diet Orders (From admission, onward)     Start     Ordered   08/05/22 0000  Diet - low sodium heart healthy        08/05/22 1027  Cardiac and Carb modified diet DISCHARGE MEDICATION: Allergies as of 08/05/2022       Reactions   Ace Inhibitors    Other reaction(s): Unknown   Eggs Or Egg-derived Products Diarrhea   Other    Other reaction(s): Other (See Comments) Eggs   Prednisone    Other reaction(s): Other (See Comments) joint pain   Risedronate    Other reaction(s): Other (See Comments)   Sulfa Antibiotics Itching, Swelling   Other reaction(s): Other (See Comments)   Sulfasalazine Other (See Comments)        Medication List     STOP taking these medications    HYDROcodone-acetaminophen 5-325 MG tablet Commonly known as: Norco   Ipratropium-Albuterol  20-100 MCG/ACT Aers respimat Commonly known as: COMBIVENT   Lagevrio 200 MG Caps capsule Generic drug: molnupiravir EUA   loratadine 10 MG tablet Commonly known as: CLARITIN   Potassium Bicarb-Citric Acid 20 MEQ Tbef       TAKE these medications    acetaminophen 500 MG tablet Commonly known as: TYLENOL Take 1,000 mg by mouth every 6 (six) hours as needed (pain).   Advair Diskus 500-50 MCG/ACT Aepb Generic drug: fluticasone-salmeterol Inhale 1 puff into the lungs 2 (two) times daily. INHALE 1 PUFF TWICE A DAY AS DIRECTED **RINSE MOUTH AFTER USE** What changed: Another medication with the same name was removed. Continue taking this medication, and follow the directions you see here.   albuterol 108 (90 Base) MCG/ACT inhaler Commonly known as: VENTOLIN HFA Inhale 2 puffs into the lungs every 6 (six) hours as needed for wheezing or shortness of breath.   amLODipine 5 MG tablet Commonly known as: NORVASC Take 5 mg by mouth daily.   azelastine 0.1 % nasal spray Commonly known as: ASTELIN ONE SPRAY INTO BOTH NOSTRILS TWICE DAILY   calcitRIOL 0.5 MCG capsule Commonly known as: ROCALTROL Take 1 capsule (0.5 mcg total) by mouth 2 (two) times daily. What changed: when to take this   CALCIUM & VIT D3 BONE HEALTH PO Take 1 tablet by mouth daily. 600 mg/ 25 mg   esomeprazole 40 MG packet Commonly known as: NEXIUM Take 40 mg by mouth daily before breakfast.   fluticasone 50 MCG/ACT nasal spray Commonly known as: FLONASE Place 2 sprays into both nostrils daily.   hydrALAZINE 100 MG tablet Commonly known as: APRESOLINE Take 100 mg by mouth 2 (two) times daily.   levothyroxine 150 MCG tablet Commonly known as: SYNTHROID Take 150 mcg by mouth daily. What changed: Another medication with the same name was removed. Continue taking this medication, and follow the directions you see here.   lidocaine 5 % Commonly known as: Lidoderm Place 1 patch onto the skin every 12  (twelve) hours. Remove & Discard patch within 12 hours or as directed by MD   loperamide 2 MG capsule Commonly known as: IMODIUM Take 1 capsule (2 mg total) by mouth as needed for diarrhea or loose stools.   meclizine 12.5 MG tablet Commonly known as: ANTIVERT Take 12.5 mg by mouth 3 (three) times daily as needed.   metoprolol tartrate 50 MG tablet Commonly known as: LOPRESSOR Take 50 mg by mouth 2 (two) times daily.   montelukast 10 MG tablet Commonly known as: SINGULAIR Take 10 mg by mouth at bedtime.   ondansetron 4 MG disintegrating tablet Commonly known as: ZOFRAN-ODT Take 4 mg by mouth every 8 (eight) hours as needed.   potassium chloride SA 20 MEQ tablet Commonly known as: KLOR-CON M Take 20  mEq by mouth 2 (two) times daily.   rosuvastatin 40 MG tablet Commonly known as: CRESTOR Take 40 mg by mouth daily.   sertraline 100 MG tablet Commonly known as: ZOLOFT Take 100 mg by mouth daily.   spironolactone 25 MG tablet Commonly known as: ALDACTONE Take 25 mg by mouth daily.   torsemide 10 MG tablet Commonly known as: DEMADEX Take 10 mg by mouth daily. Take 1-2 tablets daily        Follow-up Information     Kirk Ruths, MD. Schedule an appointment as soon as possible for a visit in 1 week(s).   Specialty: Internal Medicine Contact information: [place] [address] [phone]         Anthonette Legato, MD. Schedule an appointment as soon as possible for a visit in 1 week(s).   Specialty: Nephrology Contact information: [place] [address] [phone]                Discharge Exam: [place] [employer]   08/03/22 0500 08/04/22 0500 08/05/22 0500  Weight: 71.3 kg 72.1 kg 82.7 kg   General.  Obese elderly lady, in no acute distress. Pulmonary.  Lungs clear bilaterally, normal respiratory effort. CV.  Regular rate and rhythm, no JVD, rub or murmur. Abdomen.  Soft, nontender, nondistended, BS  positive. CNS.  Alert and oriented .  No focal neurologic deficit. Extremities.  No edema, no cyanosis, pulses intact and symmetrical. Psychiatry.  Judgment and insight appears normal.   Condition at discharge: stable  The results of significant diagnostics from this hospitalization (including imaging, microbiology, ancillary and laboratory) are listed below for reference.   Imaging Studies: CT HEAD WO CONTRAST (5MM)  Result Date: 08/02/2022 CLINICAL DATA:  Minor head trauma. Nausea and vomiting for a month since COVID EXAM: CT HEAD WITHOUT CONTRAST TECHNIQUE: Contiguous axial images were obtained from the base of the skull through the vertex without intravenous contrast. RADIATION DOSE REDUCTION: This exam was performed according to the departmental dose-optimization program which includes automated exposure control, adjustment of the mA and/or kV according to patient size and/or use of iterative reconstruction technique. COMPARISON:  09/30/2015 FINDINGS: Brain: No evidence of acute infarction, hemorrhage, hydrocephalus, extra-axial collection or mass lesion/mass effect. Suspect interval lacunar infarct in the left thalamus, no history of focal deficit. Progressed but still mild for age chronic small vessel ischemia in the cerebral white matter. Age normal brain volume. Vascular: No hyperdense vessel or unexpected calcification. Skull: Normal. Negative for fracture or focal lesion. Sinuses/Orbits: Opacified left ethmoid air cell with expansion, mucocele appearance but stable from 2016. IMPRESSION: 1. No evidence of intracranial injury. 2. Chronic small vessel ischemia that has progressed from 2016. 3. Focal left ethmoid sinus opacification with expansion and suspected mucocele but stable from 2016. Electronically Signed   By: Jorje Guild M.D.   On: 08/02/2022 05:55   DG Chest Port 1 View  Result Date: 08/02/2022 CLINICAL DATA:  Shortness of breath EXAM: PORTABLE CHEST 1 VIEW COMPARISON:   06/17/2022 FINDINGS: Chronic cardiomegaly. Stable aortic and hilar contours. There is no edema, consolidation, effusion, or pneumothorax. Artifact from EKG leads. IMPRESSION: No evidence of active disease. Electronically Signed   By: Jorje Guild M.D.   On: 08/02/2022 04:48   VAS US DUPLEX DIALYSIS ACCESS (AVF, AVG)  Result Date: 07/24/2022 DIALYSIS ACCESS Patient Name:  BLESSYN S[facility]ERV[facility]  Date of Exam:   07/10/2022 Medical Rec #: [id number]      Accession #:  [id number] Date of Birth: Aug 20, 1941      Patient Gender: F Patient Age:   81 years Exam Location:  [facility] Procedure:      VAS US DUPLEX DIALYSIS ACCESS (AVF, AVG) Referring Phys: [name] --------------------------------------------------------------------------------  Reason for Exam: Routine follow up. Access Site: Left Upper Extremity. Access Type: Brachial-cephalic AVF. History: Left arm AV creation 05/19/2022. Performing Technologist: Delorise Shiner RVT  Examination Guidelines: A complete evaluation includes B-mode imaging, spectral Doppler, color Doppler, and power Doppler as needed of all accessible portions of each vessel. Unilateral testing is considered an integral part of a complete examination. Limited examinations for reoccurring indications may be performed as noted.  Findings: +--------------------+----------+-----------------+--------+ AVF                 PSV (cm/s)Flow Vol (mL/min)Comments +--------------------+----------+-----------------+--------+ Native artery inflow   219          1622                +--------------------+----------+-----------------+--------+ AVF Anastomosis        866                              +--------------------+----------+-----------------+--------+  +------------+----------+-------------+----------+--------+ OUTFLOW VEINPSV (cm/s)Diameter (cm)Depth (cm)Describe +------------+----------+-------------+----------+--------+ Confluence     149                                     +------------+----------+-------------+----------+--------+ Clavicle       255                                    +------------+----------+-------------+----------+--------+ Shoulder       147                                    +------------+----------+-------------+----------+--------+ Prox UA        184                                    +------------+----------+-------------+----------+--------+ Mid UA         500                           stenotic +------------+----------+-------------+----------+--------+ Dist UA        196                                    +------------+----------+-------------+----------+--------+ AC Fossa       326        0.78        0.50            +------------+----------+-------------+----------+--------+   Summary: Arteriovenous fistula-Elevated velocities noted in the mid upper arm, which may be due to a retained valve. *See table(s) above for measurements and observations.  Diagnosing physician: [name] MD Electronically signed by [name] MD on 07/24/2022 at 2:06:57 PM.   --------------------------------------------------------------------------------   Final     Microbiology: Results for orders placed or performed during the hospital encounter of 08/02/22  C Difficile Quick Screen w PCR reflex  Status: None   Collection Time: 08/02/22 12:50 PM   Specimen: STOOL  Result Value Ref Range Status   C Diff antigen NEGATIVE NEGATIVE Final   C Diff toxin NEGATIVE NEGATIVE Final   C Diff interpretation No C. difficile detected.  Final    Comment: Performed at [facility], [address] [place], [place] [address]  Gastrointestinal Panel by PCR , Stool     Status: None   Collection Time: 08/02/22 12:50 PM   Specimen: STOOL  Result Value Ref Range Status   Campylobacter species NOT DETECTED NOT DETECTED Final   Plesimonas shigelloides NOT DETECTED NOT DETECTED Final   Salmonella species NOT  DETECTED NOT DETECTED Final   Yersinia enterocolitica NOT DETECTED NOT DETECTED Final   Vibrio species NOT DETECTED NOT DETECTED Final   Vibrio cholerae NOT DETECTED NOT DETECTED Final   Enteroaggregative E coli (EAEC) NOT DETECTED NOT DETECTED Final   Enteropathogenic E coli (EPEC) NOT DETECTED NOT DETECTED Final   Enterotoxigenic E coli (ETEC) NOT DETECTED NOT DETECTED Final   Shiga like toxin producing E coli (STEC) NOT DETECTED NOT DETECTED Final   Shigella/Enteroinvasive E coli (EIEC) NOT DETECTED NOT DETECTED Final   Cryptosporidium NOT DETECTED NOT DETECTED Final   Cyclospora cayetanensis NOT DETECTED NOT DETECTED Final   Entamoeba histolytica NOT DETECTED NOT DETECTED Final   Giardia lamblia NOT DETECTED NOT DETECTED Final   Adenovirus F40/41 NOT DETECTED NOT DETECTED Final   Astrovirus NOT DETECTED NOT DETECTED Final   Norovirus GI/GII NOT DETECTED NOT DETECTED Final   Rotavirus A NOT DETECTED NOT DETECTED Final   Sapovirus (I, II, IV, and V) NOT DETECTED NOT DETECTED Final    Comment: Performed at [facility], [address] [place], [place] [address]    Labs: CBC: Recent Labs  Lab 08/02/22 0157  WBC 8.8  HGB 10.7*  HCT 31.8*  MCV 84.4  PLT 567   Basic Metabolic Panel: Recent Labs  Lab 08/02/22 0157 08/02/22 1053 08/03/22 0836 08/04/22 0445 08/05/22 0539  NA 137 139 141 136 137  K 3.9 3.3* 3.8 3.3* 4.1  CL 113* 112* 113* 104 106  CO2 12* 16* 17* 22 23  GLUCOSE 149* 160* 116* 184* 117*  BUN 90* 82* 72* 63* 56*  CREATININE 3.47* 2.98* 2.66* 2.44* 2.31*  CALCIUM 5.5* 6.0* 6.7* 6.4* 7.6*  MG <0.5* 1.5* 1.7  --  1.7  PHOS 5.9*  --  4.2 3.3 2.8   Liver Function Tests: Recent Labs  Lab 08/02/22 0157 08/03/22 0836 08/04/22 0445 08/05/22 0539  AST 27  --   --   --   ALT 25  --   --   --   ALKPHOS 51  --   --   --   BILITOT 0.7  --   --   --   PROT 7.5  --   --   --   ALBUMIN 3.7 3.5 3.0* 3.0*   CBG: No results for input(s): "GLUCAP" in  the last 168 hours.  Discharge time spent: greater than 30 minutes.  This record has been created using Systems analyst. Errors have been sought and corrected,but may not always be located. Such creation errors do not reflect on the standard of care.   Signed: Lorella Nimrod, MD [facility] 08/05/2022

## 2022-10-16 ENCOUNTER — Ambulatory Visit: Payer: Medicare Other | Admitting: Dermatology

## 2022-10-30 ENCOUNTER — Encounter: Payer: Self-pay | Admitting: Dermatology

## 2022-10-30 ENCOUNTER — Ambulatory Visit (INDEPENDENT_AMBULATORY_CARE_PROVIDER_SITE_OTHER): Payer: Medicare Other | Admitting: Dermatology

## 2022-10-30 VITALS — BP 162/62 | HR 55

## 2022-10-30 DIAGNOSIS — Z1283 Encounter for screening for malignant neoplasm of skin: Secondary | ICD-10-CM

## 2022-10-30 DIAGNOSIS — L570 Actinic keratosis: Secondary | ICD-10-CM

## 2022-10-30 DIAGNOSIS — D229 Melanocytic nevi, unspecified: Secondary | ICD-10-CM

## 2022-10-30 DIAGNOSIS — L578 Other skin changes due to chronic exposure to nonionizing radiation: Secondary | ICD-10-CM | POA: Diagnosis not present

## 2022-10-30 DIAGNOSIS — L820 Inflamed seborrheic keratosis: Secondary | ICD-10-CM

## 2022-10-30 DIAGNOSIS — L814 Other melanin hyperpigmentation: Secondary | ICD-10-CM

## 2022-10-30 DIAGNOSIS — D1801 Hemangioma of skin and subcutaneous tissue: Secondary | ICD-10-CM

## 2022-10-30 DIAGNOSIS — Z85828 Personal history of other malignant neoplasm of skin: Secondary | ICD-10-CM | POA: Diagnosis not present

## 2022-10-30 DIAGNOSIS — L821 Other seborrheic keratosis: Secondary | ICD-10-CM

## 2022-10-30 NOTE — Progress Notes (Signed)
Follow-Up Visit   Subjective  [name]. [name] is a 82 y.o. female who presents for the following: Annual Exam (1 year tbse, hx of aks,. Reports some rough areas at face. ). The patient presents for Total-Body Skin Exam (TBSE) for skin cancer screening and mole check.  The patient has spots, moles and lesions to be evaluated, some may be new or changing and the patient has concerns that these could be cancer.  The following portions of the chart were reviewed this encounter and updated as appropriate:  Tobacco  Allergies  Meds  Problems  Med Hx  Surg Hx  Fam Hx     Review of Systems: No other skin or systemic complaints except as noted in HPI or Assessment and Plan.  Objective  Well appearing patient in no apparent distress; mood and affect are within normal limits.  A full examination was performed including scalp, head, eyes, ears, nose, lips, neck, chest, axillae, abdomen, back, buttocks, bilateral upper extremities, bilateral lower extremities, hands, feet, fingers, toes, fingernails, and toenails. All findings within normal limits unless otherwise noted below.  face x 5 (5) Erythematous thin papules/macules with gritty scale.   left deltoid x 1, left elbow x 1 (2) Erythematous stuck-on, waxy papule or plaque   Assessment & Plan  Actinic keratosis (5) face x 5 Actinic keratoses are precancerous spots that appear secondary to cumulative UV radiation exposure/sun exposure over time. They are chronic with expected duration over 1 year. A portion of actinic keratoses will progress to squamous cell carcinoma of the skin. It is not possible to reliably predict which spots will progress to skin cancer and so treatment is recommended to prevent development of skin cancer.  Recommend daily broad spectrum sunscreen SPF 30+ to sun-exposed areas, reapply every 2 hours as needed.  Recommend staying in the shade or wearing long sleeves, sun glasses (UVA+UVB protection) and wide  brim hats (4-inch brim around the entire circumference of the hat). Call for new or changing lesions.  Destruction of lesion - face x 5 Complexity: simple   Destruction method: cryotherapy   Informed consent: discussed and consent obtained   Timeout:  patient name, date of birth, surgical site, and procedure verified Lesion destroyed using liquid nitrogen: Yes   Region frozen until ice ball extended beyond lesion: Yes   Outcome: patient tolerated procedure well with no complications   Post-procedure details: wound care instructions given   Additional details:  Prior to procedure, discussed risks of blister formation, small wound, skin dyspigmentation, or rare scar following cryotherapy. Recommend Vaseline ointment to treated areas while healing.  Inflamed seborrheic keratosis (2) left deltoid x 1, left elbow x 1 Symptomatic, irritating, patient would like treated. Destruction of lesion - left deltoid x 1, left elbow x 1 Complexity: simple   Destruction method: cryotherapy   Informed consent: discussed and consent obtained   Timeout:  patient name, date of birth, surgical site, and procedure verified Lesion destroyed using liquid nitrogen: Yes   Region frozen until ice ball extended beyond lesion: Yes   Outcome: patient tolerated procedure well with no complications   Post-procedure details: wound care instructions given   Additional details:  Prior to procedure, discussed risks of blister formation, small wound, skin dyspigmentation, or rare scar following cryotherapy. Recommend Vaseline ointment to treated areas while healing.  Lentigines - Scattered tan macules - Due to sun exposure - Benign-appearing, observe - Recommend daily broad spectrum sunscreen SPF 30+ to sun-exposed areas, reapply every  2 hours as needed. - Call for any changes  Seborrheic Keratoses - Stuck-on, waxy, tan-brown papules and/or plaques  - Benign-appearing - Discussed benign etiology and prognosis. -  Observe - Call for any changes  Melanocytic Nevi - Tan-brown and/or pink-flesh-colored symmetric macules and papules - Benign appearing on exam today - Observation - Call clinic for new or changing moles - Recommend daily use of broad spectrum spf 30+ sunscreen to sun-exposed areas.   Hemangiomas - Red papules - Discussed benign nature - Observe - Call for any changes  Actinic Damage - Chronic condition, secondary to cumulative UV/sun exposure - diffuse scaly erythematous macules with underlying dyspigmentation - Recommend daily broad spectrum sunscreen SPF 30+ to sun-exposed areas, reapply every 2 hours as needed.  - Staying in the shade or wearing long sleeves, sun glasses (UVA+UVB protection) and wide brim hats (4-inch brim around the entire circumference of the hat) are also recommended for sun protection.  - Call for new or changing lesions.  History of Basal Cell Carcinoma of the Skin - No evidence of recurrence today - Recommend regular full body skin exams - Recommend daily broad spectrum sunscreen SPF 30+ to sun-exposed areas, reapply every 2 hours as needed.  - Call if any new or changing lesions are noted between office visits  Skin cancer screening performed today. Return in about 1 year (around 10/31/2023) for TBSE.  [name], CMA, am acting as scribe for Sarina Ser, MD. Documentation: I have reviewed the above documentation for accuracy and completeness, and I agree with the above.  Sarina Ser, MD

## 2022-10-30 NOTE — Patient Instructions (Addendum)
Cryotherapy Aftercare  Wash gently with soap and water everyday.   Apply Vaseline and Band-Aid daily until healed.     Actinic keratoses are precancerous spots that appear secondary to cumulative UV radiation exposure/sun exposure over time. They are chronic with expected duration over 1 year. A portion of actinic keratoses will progress to squamous cell carcinoma of the skin. It is not possible to reliably predict which spots will progress to skin cancer and so treatment is recommended to prevent development of skin cancer.  Recommend daily broad spectrum sunscreen SPF 30+ to sun-exposed areas, reapply every 2 hours as needed.  Recommend staying in the shade or wearing long sleeves, sun glasses (UVA+UVB protection) and wide brim hats (4-inch brim around the entire circumference of the hat). Call for new or changing lesions.     Seborrheic Keratosis  What causes seborrheic keratoses? Seborrheic keratoses are harmless, common skin growths that first appear during adult life.  As time goes by, more growths appear.  Some people may develop a large number of them.  Seborrheic keratoses appear on both covered and uncovered body parts.  They are not caused by sunlight.  The tendency to develop seborrheic keratoses can be inherited.  They vary in color from skin-colored to gray, brown, or even black.  They can be either smooth or have a rough, warty surface.   Seborrheic keratoses are superficial and look as if they were stuck on the skin.  Under the microscope this type of keratosis looks like layers upon layers of skin.  That is why at times the top layer may seem to fall off, but the rest of the growth remains and re-grows.    Treatment Seborrheic keratoses do not need to be treated, but can easily be removed in the office.  Seborrheic keratoses often cause symptoms when they rub on clothing or jewelry.  Lesions can be in the way of shaving.  If they become inflamed, they can cause itching,  soreness, or burning.  Removal of a seborrheic keratosis can be accomplished by freezing, burning, or surgery. If any spot bleeds, scabs, or grows rapidly, please return to have it checked, as these can be an indication of a skin cancer.     Melanoma ABCDEs  Melanoma is the most dangerous type of skin cancer, and is the leading cause of death from skin disease.  You are more likely to develop melanoma if you: Have light-colored skin, light-colored eyes, or red or blond hair Spend a lot of time in the sun Tan regularly, either outdoors or in a tanning bed Have had blistering sunburns, especially during childhood Have a close family member who has had a melanoma Have atypical moles or large birthmarks  Early detection of melanoma is key since treatment is typically straightforward and cure rates are extremely high if we catch it early.   The first sign of melanoma is often a change in a mole or a new dark spot.  The ABCDE system is a way of remembering the signs of melanoma.  A for asymmetry:  The two halves do not match. B for border:  The edges of the growth are irregular. C for color:  A mixture of colors are present instead of an even brown color. D for diameter:  Melanomas are usually (but not always) greater than 58m - the size of a pencil eraser. E for evolution:  The spot keeps changing in size, shape, and color.  Please check your skin once per month between  visits. You can use a small mirror in front and a large mirror behind you to keep an eye on the back side or your body.   If you see any new or changing lesions before your next follow-up, please call to schedule a visit.  Please continue daily skin protection including broad spectrum sunscreen SPF 30+ to sun-exposed areas, reapplying every 2 hours as needed when you're outdoors.   Staying in the shade or wearing long sleeves, sun glasses (UVA+UVB protection) and wide brim hats (4-inch brim around the entire circumference  of the hat) are also recommended for sun protection.    Due to recent changes in healthcare laws, you may see results of your pathology and/or laboratory studies on MyChart before the doctors have had a chance to review them. We understand that in some cases there may be results that are confusing or concerning to you. Please understand that not all results are received at the same time and often the doctors may need to interpret multiple results in order to provide you with the best plan of care or course of treatment. Therefore, we ask that you please give [place] 2 business days to thoroughly review all your results before contacting the office for clarification. Should we see a critical lab result, you will be contacted sooner.   If You Need Anything After Your Visit  If you have any questions or concerns for your doctor, please call our main line at [phone] and press option 4 to reach your doctor's medical assistant. If no one answers, please leave a voicemail as directed and we will return your call as soon as possible. Messages left after 4 pm will be answered the following business day.   You may also send [place] a message via [place]. We typically respond to MyChart messages within 1-2 business days.  For prescription refills, please ask your pharmacy to contact our office. Our fax number is [phone].  If you have an urgent issue when the clinic is closed that cannot wait until the next business day, you can page your doctor at the number below.    Please note that while we do our best to be available for urgent issues outside of office hours, we are not available 24/7.   If you have an urgent issue and are unable to reach [place], you may choose to seek medical care at your doctor's office, retail clinic, urgent care center, or emergency room.  If you have a medical emergency, please immediately call 911 or go to the emergency department.  Pager Numbers  - Dr. Nehemiah Massed: [phone]  -  Dr. Laurence Ferrari: [phone]  - Dr. Nicole Kindred: [phone]  In the event of inclement weather, please call our main line at [phone] for an update on the status of any delays or closures.  Dermatology Medication Tips: Please keep the boxes that topical medications come in in order to help keep track of the instructions about where and how to use these. Pharmacies typically print the medication instructions only on the boxes and not directly on the medication tubes.   If your medication is too expensive, please contact our office at [phone] option 4 or send [place] a message through [place].   We are unable to tell what your co-pay for medications will be in advance as this is different depending on your insurance coverage. However, we may be able to find a substitute medication at lower cost or fill out paperwork to get insurance to cover a needed medication.  If a prior authorization is required to get your medication covered by your insurance company, please allow [place] 1-2 business days to complete this process.  Drug prices often vary depending on where the prescription is filled and some pharmacies may offer cheaper prices.  The website www.goodrx.com contains coupons for medications through different pharmacies. The prices here do not account for what the cost may be with help from insurance (it may be cheaper with your insurance), but the website can give you the price if you did not use any insurance.  - You can print the associated coupon and take it with your prescription to the pharmacy.  - You may also stop by our office during regular business hours and pick up a GoodRx coupon card.  - If you need your prescription sent electronically to a different pharmacy, notify our office through [facility] or by phone at [phone] option 4.     Si Usted Necesita Algo Despus de Su Visita  Tambin puede enviarnos un mensaje a travs de Pharmacist, community. Por lo general respondemos a los  mensajes de MyChart en el transcurso de 1 a 2 das hbiles.  Para renovar recetas, por favor pida a su farmacia que se ponga en contacto con nuestra oficina. Harland Dingwall de fax es Elsa [phone].  Si tiene un asunto urgente cuando la clnica est cerrada y que no puede esperar hasta el siguiente da hbil, puede llamar/localizar a su doctor(a) al nmero que aparece a continuacin.   Por favor, tenga en cuenta que aunque hacemos todo lo posible para estar disponibles para asuntos urgentes fuera del horario de [place], no estamos disponibles las 24 horas del da, los 7 das de la [place].   Si tiene un problema urgente y no puede comunicarse con nosotros, puede optar por buscar atencin mdica  en el consultorio de su doctor(a), en una clnica privada, en un centro de atencin urgente o en una sala de emergencias.  Si tiene Engineering geologist, por favor llame inmediatamente al 911 o vaya a la sala de emergencias.  Nmeros de bper  - Dr. Nehemiah Massed: [phone]  - Dra. Moye: [phone]  - Dra. Nicole Kindred: [phone]  En caso de inclemencias del [place], por favor llame a Johnsie Kindred principal al [phone] para una actualizacin sobre el [place] de cualquier retraso o cierre.  Consejos para la medicacin en dermatologa: Por favor, guarde las cajas en las que vienen los medicamentos de uso tpico para ayudarle a seguir las instrucciones sobre dnde y cmo usarlos. Las farmacias generalmente imprimen las instrucciones del medicamento slo en las cajas y no directamente en los tubos del [place].   Si su medicamento es muy caro, por favor, pngase en contacto con Zigmund Daniel llamando al [phone] y presione la opcin 4 o envenos un mensaje a travs de Pharmacist, community.   No podemos decirle cul ser su copago por los medicamentos por adelantado ya que esto es diferente dependiendo de la cobertura de su seguro. Sin embargo, es posible que podamos encontrar un medicamento sustituto a  Electrical engineer un formulario para que el seguro cubra el medicamento que se considera necesario.   Si se requiere una autorizacin previa para que su compaa de seguros [place] su medicamento, por favor permtanos de 1 a 2 das hbiles para completar este proceso.  Los precios de los medicamentos varan con frecuencia dependiendo del Environmental consultant de dnde se surte la receta y alguna farmacias pueden ofrecer precios ms baratos.  El sitio web www.goodrx.com  de diferentes farmacias. Los precios aqu no tienen en cuenta lo que podra costar con la ayuda del seguro (puede ser ms barato con su seguro), pero el sitio web puede darle el precio si no utiliz ningn seguro.  - Puede imprimir el cupn correspondiente y llevarlo con su receta a la farmacia.  - Tambin puede pasar por nuestra oficina durante el horario de atencin regular y recoger una tarjeta de cupones de GoodRx.  - Si necesita que su receta se enve electrnicamente a una farmacia diferente, informe a nuestra oficina a travs de MyChart de [place] o por telfono llamando al [phone] y presione la opcin 4.  

## 2022-11-03 ENCOUNTER — Encounter: Payer: Self-pay | Admitting: Dermatology

## 2022-11-10 ENCOUNTER — Encounter (INDEPENDENT_AMBULATORY_CARE_PROVIDER_SITE_OTHER): Payer: Medicare Other

## 2022-11-10 ENCOUNTER — Ambulatory Visit (INDEPENDENT_AMBULATORY_CARE_PROVIDER_SITE_OTHER): Payer: Medicare Other | Admitting: Nurse Practitioner

## 2022-11-27 ENCOUNTER — Other Ambulatory Visit (INDEPENDENT_AMBULATORY_CARE_PROVIDER_SITE_OTHER): Payer: Self-pay | Admitting: Vascular Surgery

## 2022-11-27 DIAGNOSIS — N186 End stage renal disease: Secondary | ICD-10-CM

## 2022-11-30 ENCOUNTER — Ambulatory Visit (INDEPENDENT_AMBULATORY_CARE_PROVIDER_SITE_OTHER): Payer: Medicare Other | Admitting: Nurse Practitioner

## 2022-11-30 ENCOUNTER — Encounter (INDEPENDENT_AMBULATORY_CARE_PROVIDER_SITE_OTHER): Payer: Self-pay | Admitting: Nurse Practitioner

## 2022-11-30 ENCOUNTER — Ambulatory Visit (INDEPENDENT_AMBULATORY_CARE_PROVIDER_SITE_OTHER): Payer: Medicare Other

## 2022-11-30 VITALS — BP 189/61 | HR 58 | Resp 16 | Wt 178.0 lb

## 2022-11-30 DIAGNOSIS — N186 End stage renal disease: Secondary | ICD-10-CM | POA: Diagnosis not present

## 2022-11-30 DIAGNOSIS — E669 Obesity, unspecified: Secondary | ICD-10-CM | POA: Diagnosis not present

## 2022-11-30 DIAGNOSIS — E1169 Type 2 diabetes mellitus with other specified complication: Secondary | ICD-10-CM | POA: Diagnosis not present

## 2022-11-30 DIAGNOSIS — I10 Essential (primary) hypertension: Secondary | ICD-10-CM | POA: Diagnosis not present

## 2022-12-19 ENCOUNTER — Encounter (INDEPENDENT_AMBULATORY_CARE_PROVIDER_SITE_OTHER): Payer: Self-pay | Admitting: Nurse Practitioner

## 2022-12-19 NOTE — Progress Notes (Signed)
Subjective:    Patient ID: [name], female    DOB: 1941/04/24, 82 y.o.   MRN: [id number] Chief Complaint  Patient presents with   Follow-up    Ultrasound follow up    The patient returns to the office for followup of their dialysis access.   The dialysis access was created on 05/19/2022 she is still not yet on dialysis.  Access is not being used.  The patient denies redness or swelling at the access site. The patient denies fever or chills at home or while on dialysis.  No recent shortening of the patient's walking distance or new symptoms consistent with claudication.  No history of rest pain symptoms. No new ulcers or wounds of the lower extremities have occurred.  The patient denies amaurosis fugax or recent TIA symptoms. There are no recent neurological changes noted. There is no history of DVT, PE or superficial thrombophlebitis. No recent episodes of angina or shortness of breath documented.   Duplex ultrasound of the AV access shows a patent access.  The previously noted stenosis is not significantly changed compared to last study.  Flow volume today is 1741 cc/min (previous flow volume was 1622 cc/min)      Review of Systems  All other systems reviewed and are negative.      Objective:   Physical Exam Vitals reviewed.  HENT:     Head: Normocephalic.  Cardiovascular:     Rate and Rhythm: Normal rate.     Pulses:          Radial pulses are 2+ on the left side.     Arteriovenous access: Left arteriovenous access is present.    Comments: Good thrill and bruit Skin:    General: Skin is warm and dry.  Neurological:     Mental Status: She is alert and oriented to person, place, and time.  Psychiatric:        Mood and Affect: Mood normal.        Behavior: Behavior normal.        Thought Content: Thought content normal.        Judgment: Judgment normal.     BP (!) 189/61 (BP Location: Right Arm)   Pulse (!) 58   Resp 16   Wt 178 lb (80.7 kg)   BMI 32.56  kg/m   Past Medical History:  Diagnosis Date   Actinic keratosis    Albuminuria    Anemia    Arthritis    Asthma    Basal cell carcinoma 04/12/2017   Above right lateral brow. Nodulocystic type. EDC   Cancer ([place])    skin   Cataract cortical, senile    CHF (congestive heart failure) (HCC)    Diabetes mellitus without complication (HCC)    GERD (gastroesophageal reflux disease)    Hemorrhoids    History of kidney stones    Hyperlipidemia    Hypertension    Hypothyroidism    Lyme disease    No kidney function    OSA (obstructive sleep apnea)    Osteoporosis    Osteoporosis    Reflux esophagitis    Steatohepatitis    Steatohepatitis     Social History   Socioeconomic History   Marital status: Married    Spouse name: Not on file   Number of children: Not on file   Years of education: Not on file   Highest education level: Not on file  Occupational History   Not on file  Tobacco Use  Smoking status: Never   Smokeless tobacco: Never  Vaping Use   Vaping Use: Never used  Substance and Sexual Activity   Alcohol use: No   Drug use: No   Sexual activity: Not on file  Other Topics Concern   Not on file  Social History Narrative   Lives at home with husband .   Social Determinants of Health   Financial Resource Strain: Not on file  Food Insecurity: No Food Insecurity (08/02/2022)   Hunger Vital Sign    Worried About Running Out of Food in the Last Year: Never true    Ran Out of Food in the Last Year: Never true  Transportation Needs: No Transportation Needs (08/02/2022)   PRAPARE - Hydrologist (Medical): No    Lack of Transportation (Non-Medical): No  Physical Activity: Not on file  Stress: Not on file  Social Connections: Not on file  Intimate Partner Violence: Not At Risk (08/02/2022)   Humiliation, Afraid, Rape, and Kick questionnaire    Fear of Current or Ex-Partner: No    Emotionally Abused: No    Physically Abused: No     Sexually Abused: No    Past Surgical History:  Procedure Laterality Date   ABDOMINAL HYSTERECTOMY     APPENDECTOMY     AV FISTULA PLACEMENT Left 05/19/2022   Procedure: ARTERIOVENOUS (AV) FISTULA CREATION ( BRACHIAL CEPHALIC );  Surgeon: Katha Cabal, MD;  Location: [facility] ORS;  Service: Vascular;  Laterality: Left;   [place]  08/13/2014   [facility]. no significant CAD, normal LVEDP.    CATARACT EXTRACTION     CHOLECYSTECTOMY     COLONOSCOPY     COLONOSCOPY WITH PROPOFOL N/A 12/07/2016   Procedure: COLONOSCOPY WITH PROPOFOL;  Surgeon: Lollie Sails, MD;  Location: [facility] ENDOSCOPY;  Service: Endoscopy;  Laterality: N/A;   ESOPHAGOGASTRODUODENOSCOPY     ESOPHAGOGASTRODUODENOSCOPY (EGD) WITH PROPOFOL N/A 12/07/2016   Procedure: ESOPHAGOGASTRODUODENOSCOPY (EGD) WITH PROPOFOL;  Surgeon: Lollie Sails, MD;  Location: [facility] ENDOSCOPY;  Service: Endoscopy;  Laterality: N/A;   ESOPHAGOGASTRODUODENOSCOPY (EGD) WITH PROPOFOL N/A 01/07/2018   Procedure: ESOPHAGOGASTRODUODENOSCOPY (EGD) WITH PROPOFOL;  Surgeon: Lollie Sails, MD;  Location: [facility] ENDOSCOPY;  Service: Endoscopy;  Laterality: N/A;   EYE SURGERY     HEMORRHOID SURGERY     PARTIAL HYSTERECTOMY     THYROIDECTOMY N/A 08/25/2019   Procedure: THYROIDECTOMY EXTRACTION OF SUBTOTAL COMPONENT; PARATHYROID AUTOTRANSPLANT X1;  Surgeon: Fredirick Maudlin, MD;  Location: [facility] ORS;  Service: General;  Laterality: N/A;  With Nerve Monitoring(RLN)   TONSILLECTOMY      Family History  Problem Relation Age of Onset   Breast cancer Mother    Heart attack Father     Allergies  Allergen Reactions   Ace Inhibitors     Other reaction(s): Unknown   Egg-Derived Products Diarrhea   Other     Other reaction(s): Other (See Comments) Eggs   Prednisone     Other reaction(s): Other (See Comments) joint pain   Risedronate     Other reaction(s): Other (See Comments)   Sulfa Antibiotics Itching  and Swelling    Other reaction(s): Other (See Comments)   Sulfasalazine Other (See Comments)       Latest Ref Rng & Units 08/02/2022    1:57 AM 06/21/2022    7:05 AM 06/20/2022    4:07 AM  CBC  WBC 4.0 - 10.5 K/uL 8.8  9.4  9.4   Hemoglobin 12.0 - 15.0 g/dL 10.7  8.7  8.8   Hematocrit 36.0 - 46.0 % 31.8  27.2  27.4   Platelets 150 - 400 K/uL 193  179  194       CMP     Component Value Date/Time   NA 137 08/05/2022 0539   NA 141 08/11/2014 0850   NA 142 06/20/2012 1353   K 4.1 08/05/2022 0539   K 3.2 (L) 06/20/2012 1353   CL 106 08/05/2022 0539   CL 103 06/20/2012 1353   CO2 23 08/05/2022 0539   CO2 29 06/20/2012 1353   GLUCOSE 117 (H) 08/05/2022 0539   GLUCOSE 84 06/20/2012 1353   BUN 56 (H) 08/05/2022 0539   BUN 16 08/11/2014 0850   BUN 13 06/20/2012 1353   CREATININE 2.31 (H) 08/05/2022 0539   CREATININE 0.65 06/20/2012 1353   CALCIUM 7.6 (L) 08/05/2022 0539   CALCIUM 9.4 06/20/2012 1353   PROT 7.5 08/02/2022 0157   PROT 7.4 06/20/2012 1353   ALBUMIN 3.0 (L) 08/05/2022 0539   ALBUMIN 3.8 06/20/2012 1353   AST 27 08/02/2022 0157   AST 32 06/20/2012 1353   ALT 25 08/02/2022 0157   ALT 44 06/20/2012 1353   ALKPHOS 51 08/02/2022 0157   ALKPHOS 106 06/20/2012 1353   BILITOT 0.7 08/02/2022 0157   BILITOT 0.6 06/20/2012 1353   GFRNONAA 21 (L) 08/05/2022 0539   GFRNONAA >60 06/20/2012 1353   GFRAA >60 12/31/2016 0318   GFRAA >60 06/20/2012 1353     No results found.     Assessment & Plan:   1. ESRD (end stage renal disease) ([place]) Recommend:  The patient is doing well and currently has adequate dialysis access. The patient is not yet on dialysis.  The patient should have a duplex ultrasound of the dialysis access in 6 months. The patient will follow-up with me in the office after each ultrasound   - VAS [place] (AVF, AVG)  2. Essential hypertension Continue antihypertensive medications as already ordered, these medications have been  reviewed and there are no changes at this time.  3. Diabetes mellitus type 2 in obese [facility]) Continue hypoglycemic medications as already ordered, these medications have been reviewed and there are no changes at this time.  Hgb A1C to be monitored as already arranged by primary service   Current Outpatient Medications on File Prior to Visit  Medication Sig Dispense Refill   acetaminophen (TYLENOL) 500 MG tablet Take 1,000 mg by mouth every 6 (six) hours as needed (pain).     ADVAIR DISKUS 500-50 MCG/ACT AEPB Inhale 1 puff into the lungs 2 (two) times daily. INHALE 1 PUFF TWICE A DAY AS DIRECTED **RINSE MOUTH AFTER USE**     albuterol (PROVENTIL HFA;VENTOLIN HFA) 108 (90 BASE) MCG/ACT inhaler Inhale 2 puffs into the lungs every 6 (six) hours as needed for wheezing or shortness of breath.     amLODipine (NORVASC) 5 MG tablet Take 5 mg by mouth daily.     azelastine (ASTELIN) 0.1 % nasal spray ONE SPRAY INTO BOTH NOSTRILS TWICE DAILY     calcitRIOL (ROCALTROL) 0.5 MCG capsule Take 1 capsule (0.5 mcg total) by mouth 2 (two) times daily. 60 capsule 1   esomeprazole (NEXIUM) 40 MG packet Take 40 mg by mouth daily before breakfast.     fluticasone (FLONASE) 50 MCG/ACT nasal spray Place 2 sprays into both nostrils daily.     hydrALAZINE (APRESOLINE) 100 MG tablet Take  100 mg by mouth 2 (two) times daily.     levothyroxine (SYNTHROID) 150 MCG tablet Take 150 mcg by mouth daily.     loperamide (IMODIUM) 2 MG capsule Take 1 capsule (2 mg total) by mouth as needed for diarrhea or loose stools. 30 capsule 0   meclizine (ANTIVERT) 12.5 MG tablet Take 12.5 mg by mouth 3 (three) times daily as needed.     metoprolol tartrate (LOPRESSOR) 50 MG tablet Take 50 mg by mouth 2 (two) times daily.      montelukast (SINGULAIR) 10 MG tablet Take 10 mg by mouth at bedtime.     Multiple Minerals-Vitamins (CALCIUM & VIT D3 BONE HEALTH PO) Take 1 tablet by mouth daily. 600 mg/ 25 mg     ondansetron (ZOFRAN-ODT) 4 MG  disintegrating tablet Take 4 mg by mouth every 8 (eight) hours as needed.     potassium chloride SA (KLOR-CON M) 20 MEQ tablet Take 20 mEq by mouth 2 (two) times daily.     rosuvastatin (CRESTOR) 40 MG tablet Take 40 mg by mouth daily.     sertraline (ZOLOFT) 100 MG tablet Take 100 mg by mouth daily.     spironolactone (ALDACTONE) 25 MG tablet Take 25 mg by mouth daily.     torsemide (DEMADEX) 10 MG tablet Take 10 mg by mouth daily. Take 1-2 tablets daily     lidocaine (LIDODERM) 5 % Place 1 patch onto the skin every 12 (twelve) hours. Remove & Discard patch within 12 hours or as directed by MD (Patient not taking: Reported on 07/10/2022) 15 patch 0   No current facility-administered medications on file prior to visit.    There are no Patient Instructions on file for this visit. No follow-ups on file.   Kris Hartmann, NP

## 2023-03-23 ENCOUNTER — Inpatient Hospital Stay
Admission: EM | Admit: 2023-03-23 | Discharge: 2023-03-27 | DRG: 641 | Disposition: A | Discharge status: Home Health | Payer: Medicare Other | Source: Ambulatory Visit | Attending: Internal Medicine | Admitting: Internal Medicine

## 2023-03-23 DIAGNOSIS — E878 Other disorders of electrolyte and fluid balance, not elsewhere classified: Secondary | ICD-10-CM | POA: Diagnosis present

## 2023-03-23 DIAGNOSIS — Z87442 Personal history of urinary calculi: Secondary | ICD-10-CM

## 2023-03-23 DIAGNOSIS — N2581 Secondary hyperparathyroidism of renal origin: Secondary | ICD-10-CM | POA: Diagnosis present

## 2023-03-23 DIAGNOSIS — E1122 Type 2 diabetes mellitus with diabetic chronic kidney disease: Secondary | ICD-10-CM | POA: Diagnosis present

## 2023-03-23 DIAGNOSIS — R200 Anesthesia of skin: Secondary | ICD-10-CM | POA: Diagnosis present

## 2023-03-23 DIAGNOSIS — R531 Weakness: Secondary | ICD-10-CM

## 2023-03-23 DIAGNOSIS — N185 Chronic kidney disease, stage 5: Secondary | ICD-10-CM | POA: Diagnosis present

## 2023-03-23 DIAGNOSIS — Z85828 Personal history of other malignant neoplasm of skin: Secondary | ICD-10-CM

## 2023-03-23 DIAGNOSIS — Z79899 Other long term (current) drug therapy: Secondary | ICD-10-CM

## 2023-03-23 DIAGNOSIS — I132 Hypertensive heart and chronic kidney disease with heart failure and with stage 5 chronic kidney disease, or end stage renal disease: Secondary | ICD-10-CM | POA: Diagnosis present

## 2023-03-23 DIAGNOSIS — J449 Chronic obstructive pulmonary disease, unspecified: Secondary | ICD-10-CM | POA: Diagnosis present

## 2023-03-23 DIAGNOSIS — R002 Palpitations: Secondary | ICD-10-CM | POA: Diagnosis present

## 2023-03-23 DIAGNOSIS — Z9049 Acquired absence of other specified parts of digestive tract: Secondary | ICD-10-CM

## 2023-03-23 DIAGNOSIS — I5033 Acute on chronic diastolic (congestive) heart failure: Secondary | ICD-10-CM | POA: Diagnosis present

## 2023-03-23 DIAGNOSIS — E669 Obesity, unspecified: Secondary | ICD-10-CM | POA: Diagnosis present

## 2023-03-23 DIAGNOSIS — J4489 Other specified chronic obstructive pulmonary disease: Secondary | ICD-10-CM | POA: Diagnosis present

## 2023-03-23 DIAGNOSIS — Z90711 Acquired absence of uterus with remaining cervical stump: Secondary | ICD-10-CM

## 2023-03-23 DIAGNOSIS — Z7951 Long term (current) use of inhaled steroids: Secondary | ICD-10-CM

## 2023-03-23 DIAGNOSIS — E872 Acidosis, unspecified: Secondary | ICD-10-CM | POA: Diagnosis present

## 2023-03-23 DIAGNOSIS — D631 Anemia in chronic kidney disease: Secondary | ICD-10-CM | POA: Diagnosis present

## 2023-03-23 DIAGNOSIS — G4733 Obstructive sleep apnea (adult) (pediatric): Secondary | ICD-10-CM

## 2023-03-23 DIAGNOSIS — I10 Essential (primary) hypertension: Secondary | ICD-10-CM | POA: Diagnosis present

## 2023-03-23 DIAGNOSIS — E890 Postprocedural hypothyroidism: Secondary | ICD-10-CM | POA: Diagnosis present

## 2023-03-23 DIAGNOSIS — K219 Gastro-esophageal reflux disease without esophagitis: Secondary | ICD-10-CM | POA: Diagnosis present

## 2023-03-23 DIAGNOSIS — Z803 Family history of malignant neoplasm of breast: Secondary | ICD-10-CM

## 2023-03-23 DIAGNOSIS — Z6832 Body mass index (BMI) 32.0-32.9, adult: Secondary | ICD-10-CM

## 2023-03-23 DIAGNOSIS — K7581 Nonalcoholic steatohepatitis (NASH): Secondary | ICD-10-CM | POA: Diagnosis present

## 2023-03-23 DIAGNOSIS — Z882 Allergy status to sulfonamides status: Secondary | ICD-10-CM

## 2023-03-23 DIAGNOSIS — E039 Hypothyroidism, unspecified: Secondary | ICD-10-CM | POA: Diagnosis present

## 2023-03-23 DIAGNOSIS — E785 Hyperlipidemia, unspecified: Secondary | ICD-10-CM | POA: Diagnosis present

## 2023-03-23 DIAGNOSIS — Z8249 Family history of ischemic heart disease and other diseases of the circulatory system: Secondary | ICD-10-CM

## 2023-03-23 DIAGNOSIS — Z888 Allergy status to other drugs, medicaments and biological substances status: Secondary | ICD-10-CM

## 2023-03-23 DIAGNOSIS — I5032 Chronic diastolic (congestive) heart failure: Secondary | ICD-10-CM | POA: Diagnosis present

## 2023-03-23 DIAGNOSIS — Z79890 Hormone replacement therapy (postmenopausal): Secondary | ICD-10-CM

## 2023-03-23 LAB — BASIC METABOLIC PANEL
Anion gap: 12 (ref 5–15)
BUN: 62 mg/dL — ABNORMAL HIGH (ref 8–23)
CO2: 19 mmol/L — ABNORMAL LOW (ref 22–32)
Calcium: 5.5 mg/dL — CL (ref 8.9–10.3)
Chloride: 102 mmol/L (ref 98–111)
Creatinine, Ser: 2.41 mg/dL — ABNORMAL HIGH (ref 0.44–1.00)
GFR, Estimated: 20 mL/min — ABNORMAL LOW (ref >60)
Glucose, Bld: 84 mg/dL (ref 70–99)
Potassium: 3.9 mmol/L (ref 3.5–5.1)
Sodium: 133 mmol/L — ABNORMAL LOW (ref 135–145)

## 2023-03-23 LAB — CBC
HCT: 30 % — ABNORMAL LOW (ref 36.0–46.0)
Hemoglobin: 9.6 g/dL — ABNORMAL LOW (ref 12.0–15.0)
MCH: 28.6 pg (ref 26.0–34.0)
MCHC: 32 g/dL (ref 30.0–36.0)
MCV: 89.3 fL (ref 80.0–100.0)
Platelets: 202 10*3/uL (ref 150–400)
RBC: 3.36 MIL/uL — ABNORMAL LOW (ref 3.87–5.11)
RDW: 13.5 % (ref 11.5–15.5)
WBC: 9.8 10*3/uL (ref 4.0–10.5)
nRBC: 0 % (ref 0.0–0.2)

## 2023-03-23 LAB — MAGNESIUM: Magnesium: 0.6 mg/dL — CL (ref 1.7–2.4)

## 2023-03-23 LAB — TSH: TSH: 1.099 u[IU]/mL (ref 0.350–4.500)

## 2023-03-23 MED ORDER — HYDRALAZINE HCL 50 MG PO TABS
100.0000 mg | ORAL_TABLET | Freq: Two times a day (BID) | ORAL | Status: DC
  Administered 2023-03-24 – 2023-03-27 (×7): 100 mg via ORAL
  Filled 2023-03-23 (×7): qty 2

## 2023-03-23 MED ORDER — AMLODIPINE BESYLATE 5 MG PO TABS
5.0000 mg | ORAL_TABLET | Freq: Every day | ORAL | Status: DC
  Administered 2023-03-24 – 2023-03-27 (×4): 5 mg via ORAL
  Filled 2023-03-23 (×4): qty 1

## 2023-03-23 MED ORDER — MAGNESIUM OXIDE -MG SUPPLEMENT 400 (240 MG) MG PO TABS
800.0000 mg | ORAL_TABLET | Freq: Two times a day (BID) | ORAL | Status: DC
  Administered 2023-03-24 – 2023-03-27 (×8): 800 mg via ORAL
  Filled 2023-03-23 (×8): qty 2

## 2023-03-23 MED ORDER — MECLIZINE HCL 25 MG PO TABS: 12.5000 mg | ORAL_TABLET | Freq: Three times a day (TID) | ORAL | Status: DC | PRN

## 2023-03-23 MED ORDER — ONDANSETRON HCL 4 MG PO TABS: 4.0000 mg | ORAL_TABLET | Freq: Four times a day (QID) | ORAL | Status: DC | PRN

## 2023-03-23 MED ORDER — ENOXAPARIN SODIUM 30 MG/0.3ML IJ SOSY
30.0000 mg | PREFILLED_SYRINGE | INTRAMUSCULAR | Status: DC
  Administered 2023-03-23 – 2023-03-26 (×4): 30 mg via SUBCUTANEOUS
  Filled 2023-03-23 (×4): qty 0.3

## 2023-03-23 MED ORDER — MOMETASONE FURO-FORMOTEROL FUM 200-5 MCG/ACT IN AERO
2.0000 | INHALATION_SPRAY | Freq: Two times a day (BID) | RESPIRATORY_TRACT | Status: DC
  Administered 2023-03-24 – 2023-03-27 (×7): 2 via RESPIRATORY_TRACT
  Filled 2023-03-23: qty 8.8

## 2023-03-23 MED ORDER — SERTRALINE HCL 50 MG PO TABS
100.0000 mg | ORAL_TABLET | Freq: Every day | ORAL | Status: DC
  Administered 2023-03-24 – 2023-03-27 (×4): 100 mg via ORAL
  Filled 2023-03-23 (×4): qty 2

## 2023-03-23 MED ORDER — CALCITRIOL 0.25 MCG PO CAPS
0.5000 ug | ORAL_CAPSULE | Freq: Two times a day (BID) | ORAL | Status: DC
  Administered 2023-03-24 – 2023-03-27 (×7): 0.5 ug via ORAL
  Filled 2023-03-23 (×7): qty 2

## 2023-03-23 MED ORDER — SODIUM CHLORIDE 0.9 % IV SOLN: INTRAVENOUS | Status: DC

## 2023-03-23 MED ORDER — ALBUTEROL SULFATE (2.5 MG/3ML) 0.083% IN NEBU
2.5000 mg | INHALATION_SOLUTION | Freq: Four times a day (QID) | RESPIRATORY_TRACT | Status: DC | PRN
  Administered 2023-03-24: 2.5 mg via RESPIRATORY_TRACT
  Filled 2023-03-23: qty 3

## 2023-03-23 MED ORDER — METOPROLOL TARTRATE 50 MG PO TABS
50.0000 mg | ORAL_TABLET | Freq: Two times a day (BID) | ORAL | Status: DC
  Administered 2023-03-24 – 2023-03-27 (×7): 50 mg via ORAL
  Filled 2023-03-23 (×7): qty 1

## 2023-03-23 MED ORDER — ONDANSETRON HCL 4 MG/2ML IJ SOLN: 4.0000 mg | Freq: Four times a day (QID) | INTRAMUSCULAR | Status: DC | PRN

## 2023-03-23 MED ORDER — CALCIUM GLUCONATE-NACL 2-0.675 GM/100ML-% IV SOLN
2.0000 g | Freq: Once | INTRAVENOUS | Status: AC
  Administered 2023-03-23: 2000 mg via INTRAVENOUS
  Filled 2023-03-23: qty 100

## 2023-03-23 MED ORDER — ACETAMINOPHEN 325 MG PO TABS
650.0000 mg | ORAL_TABLET | Freq: Four times a day (QID) | ORAL | Status: DC | PRN
  Administered 2023-03-24 – 2023-03-27 (×2): 650 mg via ORAL
  Filled 2023-03-23 (×2): qty 2

## 2023-03-23 MED ORDER — SPIRONOLACTONE 25 MG PO TABS
25.0000 mg | ORAL_TABLET | Freq: Every day | ORAL | Status: DC
  Administered 2023-03-24 – 2023-03-27 (×4): 25 mg via ORAL
  Filled 2023-03-23 (×4): qty 1

## 2023-03-23 MED ORDER — LEVOTHYROXINE SODIUM 50 MCG PO TABS
150.0000 ug | ORAL_TABLET | Freq: Every day | ORAL | Status: DC
  Administered 2023-03-24 – 2023-03-27 (×4): 150 ug via ORAL
  Filled 2023-03-23 (×4): qty 1

## 2023-03-23 MED ORDER — ACETAMINOPHEN 650 MG RE SUPP: 650.0000 mg | Freq: Four times a day (QID) | RECTAL | Status: DC | PRN

## 2023-03-23 MED ORDER — ROSUVASTATIN CALCIUM 10 MG PO TABS
40.0000 mg | ORAL_TABLET | Freq: Every day | ORAL | Status: DC
  Administered 2023-03-24 – 2023-03-26 (×3): 40 mg via ORAL
  Filled 2023-03-23 (×4): qty 4
  Filled 2023-03-23: qty 2

## 2023-03-23 MED ORDER — MAGNESIUM SULFATE 2 GM/50ML IV SOLN
2.0000 g | Freq: Once | INTRAVENOUS | Status: AC
  Administered 2023-03-23: 2 g via INTRAVENOUS
  Filled 2023-03-23: qty 50

## 2023-03-23 MED ORDER — PANTOPRAZOLE SODIUM 40 MG PO TBEC
40.0000 mg | DELAYED_RELEASE_TABLET | Freq: Every day | ORAL | Status: DC
  Administered 2023-03-24 – 2023-03-27 (×4): 40 mg via ORAL
  Filled 2023-03-23 (×4): qty 1

## 2023-03-23 NOTE — H&P (Signed)
History and Physical    Patient: [name] ZOX:[id number] DOB: 1941/02/16 DOA: 03/23/2023 DOS: the patient was seen and examined on 03/23/2023 PCP: Lauro Regulus, MD  Patient coming from: Home  Chief Complaint:  Chief Complaint  Patient presents with   Abnormal Labs    HPI: [name] is a 82 y.o. female with medical history significant for CKD 5 with secondary hyperparathyroidism last seen by nephrology 01/2023, anemia of CKD, diastolic CHF, HTN, hypothyroidism, COPD, with past hospitalizations in September and November 2023 for severe symptomatic electrolyte derangements, who was sent to the ED for abnormal labs.  Patient initially presented to the clinic with weakness following a 2-day bout of diarrhea about 4 days prior..  She tried drinking lots of fluids but without improvement.  Diarrhea has since resolved.  Workup in the office showed a QT interval of 466, calcium of 5.8 and she was directed to the emergency room.  Upon arrival, she additionally complained of feeling some numbness in her hands and feet.  Denies chest pain, shortness of breath, headache or visual disturbance, nausea or vomiting, abdominal pain or diarrhea. ED course and data review: BP 146/54 with otherwise normal vitals.  Labs notable for calcium 5.5 and magnesium 0.6.  Sodium 133 and potassium 3.9.  BUN/creatinine 62/2.41, creatinine at baseline.  Hemoglobin at baseline at 9.6 and WBC normal.  TSH normal at 1.099. EKG not done No imaging done Patient treated with magnesium sulfate IVPB 2 g and calcium gluconate 2 g. Hospitalist consulted for admission.   Review of Systems: As mentioned in the history of present illness. All other systems reviewed and are negative.  Past Medical History:  Diagnosis Date   Actinic keratosis    Albuminuria    Anemia    Arthritis    Asthma    Basal cell carcinoma 04/12/2017   Above right lateral brow. Nodulocystic type. EDC   Cancer (HCC)    skin   Cataract  cortical, senile    CHF (congestive heart failure) (HCC)    Diabetes mellitus without complication (HCC)    GERD (gastroesophageal reflux disease)    Hemorrhoids    History of kidney stones    Hyperlipidemia    Hypertension    Hypothyroidism    Lyme disease    No kidney function    OSA (obstructive sleep apnea)    Osteoporosis    Osteoporosis    Reflux esophagitis    Steatohepatitis    Steatohepatitis    Past Surgical History:  Procedure Laterality Date   ABDOMINAL HYSTERECTOMY     APPENDECTOMY     AV FISTULA PLACEMENT Left 05/19/2022   Procedure: ARTERIOVENOUS (AV) FISTULA CREATION ( BRACHIAL CEPHALIC );  Surgeon: Renford Dills, MD;  Location: [facility] ORS;  Service: Vascular;  Laterality: Left;   CARDIAC CATHETERIZATION  1980   [facility]   CARDIAC CATHETERIZATION  08/13/2014   [facility]. no significant CAD, normal LVEDP.    CATARACT EXTRACTION     CHOLECYSTECTOMY     COLONOSCOPY     COLONOSCOPY WITH PROPOFOL N/A 12/07/2016   Procedure: COLONOSCOPY WITH PROPOFOL;  Surgeon: Christena Deem, MD;  Location: [facility] ENDOSCOPY;  Service: Endoscopy;  Laterality: N/A;   ESOPHAGOGASTRODUODENOSCOPY     ESOPHAGOGASTRODUODENOSCOPY (EGD) WITH PROPOFOL N/A 12/07/2016   Procedure: ESOPHAGOGASTRODUODENOSCOPY (EGD) WITH PROPOFOL;  Surgeon: Christena Deem, MD;  Location: [facility] ENDOSCOPY;  Service: Endoscopy;  Laterality: N/A;   ESOPHAGOGASTRODUODENOSCOPY (EGD) WITH PROPOFOL N/A 01/07/2018   Procedure: ESOPHAGOGASTRODUODENOSCOPY (EGD) WITH PROPOFOL;  Surgeon: Christena Deem, MD;  Location: [facility] ENDOSCOPY;  Service: Endoscopy;  Laterality: N/A;   EYE SURGERY     HEMORRHOID SURGERY     PARTIAL HYSTERECTOMY     THYROIDECTOMY N/A 08/25/2019   Procedure: THYROIDECTOMY EXTRACTION OF SUBTOTAL COMPONENT; PARATHYROID AUTOTRANSPLANT X1;  Surgeon: Duanne Guess, MD;  Location: [facility] ORS;  Service: General;  Laterality: N/A;  With Nerve Monitoring(RLN)   TONSILLECTOMY     Social History:  reports that she has  never smoked. She has never used smokeless tobacco. She reports that she does not drink alcohol and does not use drugs.  Allergies  Allergen Reactions   Ace Inhibitors     Other reaction(s): Unknown   Egg-Derived Products Diarrhea   Other     Other reaction(s): Other (See Comments) Eggs   Prednisone     Other reaction(s): Other (See Comments) joint pain   Risedronate     Other reaction(s): Other (See Comments)   Sulfa Antibiotics Itching and Swelling    Other reaction(s): Other (See Comments)   Sulfasalazine Other (See Comments)    Family History  Problem Relation Age of Onset   Breast cancer Mother    Heart attack Father     Prior to Admission medications   Medication Sig Start Date End Date Taking? Authorizing Provider  acetaminophen (TYLENOL) 500 MG tablet Take 1,000 mg by mouth every 6 (six) hours as needed (pain).    [provider]  ADVAIR DISKUS 500-50 MCG/ACT AEPB Inhale 1 puff into the lungs 2 (two) times daily. INHALE 1 PUFF TWICE A DAY AS DIRECTED **RINSE MOUTH AFTER USE** 07/26/22   [provider]  albuterol (PROVENTIL HFA;VENTOLIN HFA) 108 (90 BASE) MCG/ACT inhaler Inhale 2 puffs into the lungs every 6 (six) hours as needed for wheezing or shortness of breath.    [provider]  amLODipine (NORVASC) 5 MG tablet Take 5 mg by mouth daily. 05/23/22   [provider]  azelastine (ASTELIN) 0.1 % nasal spray ONE SPRAY INTO BOTH NOSTRILS TWICE DAILY 09/14/20   [provider]  calcitRIOL (ROCALTROL) 0.5 MCG capsule Take 1 capsule (0.5 mcg total) by mouth 2 (two) times daily. 08/05/22   Arnetha Courser, MD  esomeprazole (NEXIUM) 40 MG packet Take 40 mg by mouth daily before breakfast.    [provider]  fluticasone (FLONASE) 50 MCG/ACT nasal spray Place 2 sprays into both nostrils daily.    [provider]  hydrALAZINE (APRESOLINE) 100 MG tablet Take 100 mg by mouth 2 (two) times daily.    [provider]   levothyroxine (SYNTHROID) 150 MCG tablet Take 150 mcg by mouth daily. 07/18/22   [provider]  lidocaine (LIDODERM) 5 % Place 1 patch onto the skin every 12 (twelve) hours. Remove & Discard patch within 12 hours or as directed by MD Patient not taking: Reported on 07/10/2022 06/08/22   Sharman Cheek, MD  loperamide (IMODIUM) 2 MG capsule Take 1 capsule (2 mg total) by mouth as needed for diarrhea or loose stools. 08/05/22   Arnetha Courser, MD  meclizine (ANTIVERT) 12.5 MG tablet Take 12.5 mg by mouth 3 (three) times daily as needed. 07/31/22   [provider]  metoprolol tartrate (LOPRESSOR) 50 MG tablet Take 50 mg by mouth 2 (two) times daily.  08/27/17   [provider]  montelukast (SINGULAIR) 10 MG tablet Take 10 mg by mouth at bedtime.    [provider]  Multiple Minerals-Vitamins (CALCIUM & VIT D3 BONE HEALTH  PO) Take 1 tablet by mouth daily. 600 mg/ 25 mg    [provider]  ondansetron (ZOFRAN-ODT) 4 MG disintegrating tablet Take 4 mg by mouth every 8 (eight) hours as needed. 07/31/22   [provider]  potassium chloride SA (KLOR-CON M) 20 MEQ tablet Take 20 mEq by mouth 2 (two) times daily.    [provider]  rosuvastatin (CRESTOR) 40 MG tablet Take 40 mg by mouth daily.    [provider]  sertraline (ZOLOFT) 100 MG tablet Take 100 mg by mouth daily.    [provider]  spironolactone (ALDACTONE) 25 MG tablet Take 25 mg by mouth daily. 07/06/22   [provider]  torsemide (DEMADEX) 10 MG tablet Take 10 mg by mouth daily. Take 1-2 tablets daily    [provider]    Physical Exam: Vitals:   03/23/23 2030 03/23/23 2100 03/23/23 2130 03/23/23 2139  BP: (!) 154/72 (!) 141/81 (!) 158/56   Pulse: 72 67 65   Resp: (!) 23 18 (!) 22   Temp:    98 F (36.7 C)  TempSrc:    Oral  SpO2: 96% 94% 94%   Weight:      Height:       Physical Exam Vitals and nursing note reviewed.   Constitutional:      General: She is not in acute distress. HENT:     Head: Normocephalic and atraumatic.  Cardiovascular:     Rate and Rhythm: Normal rate and regular rhythm.     Heart sounds: Normal heart sounds.  Pulmonary:     Effort: Tachypnea present.     Breath sounds: Normal breath sounds.  Abdominal:     Palpations: Abdomen is soft.     Tenderness: There is no abdominal tenderness.  Neurological:     Mental Status: Mental status is at baseline.     Labs on Admission: I have personally reviewed following labs and imaging studies  CBC: Recent Labs  Lab 03/23/23 2026  WBC 9.8  HGB 9.6*  HCT 30.0*  MCV 89.3  PLT 202   Basic Metabolic Panel: Recent Labs  Lab 03/23/23 2026  NA 133*  K 3.9  CL 102  CO2 19*  GLUCOSE 84  BUN 62*  CREATININE 2.41*  CALCIUM 5.5*  MG 0.6*   GFR: Estimated Creatinine Clearance: 17.8 mL/min (A) (by C-G formula based on SCr of 2.41 mg/dL (H)). Liver Function Tests: No results for input(s): "AST", "ALT", "ALKPHOS", "BILITOT", "PROT", "ALBUMIN" in the last 168 hours. No results for input(s): "LIPASE", "AMYLASE" in the last 168 hours. No results for input(s): "AMMONIA" in the last 168 hours. Coagulation Profile: No results for input(s): "INR", "PROTIME" in the last 168 hours. Cardiac Enzymes: No results for input(s): "CKTOTAL", "CKMB", "CKMBINDEX", "TROPONINI" in the last 168 hours. BNP (last 3 results) No results for input(s): "PROBNP" in the last 8760 hours. HbA1C: No results for input(s): "HGBA1C" in the last 72 hours. CBG: No results for input(s): "GLUCAP" in the last 168 hours. Lipid Profile: No results for input(s): "CHOL", "HDL", "LDLCALC", "TRIG", "CHOLHDL", "LDLDIRECT" in the last 72 hours. Thyroid Function Tests: Recent Labs    03/23/23 2026  TSH 1.099   Anemia Panel: No results for input(s): "VITAMINB12", "FOLATE", "FERRITIN", "TIBC", "IRON", "RETICCTPCT" in the last 72 hours. Urine analysis:    Component  Value Date/Time   COLORURINE YELLOW (A) 08/02/2022 0157   APPEARANCEUR HAZY (A) 08/02/2022 0157   APPEARANCEUR Clear 09/17/2018 1028   LABSPEC  1.006 08/02/2022 0157   LABSPEC 1.013 06/20/2012 1508   PHURINE 5.0 08/02/2022 0157   GLUCOSEU NEGATIVE 08/02/2022 0157   GLUCOSEU Negative 06/20/2012 1508   HGBUR NEGATIVE 08/02/2022 0157   BILIRUBINUR NEGATIVE 08/02/2022 0157   BILIRUBINUR Negative 09/17/2018 1028   BILIRUBINUR Negative 06/20/2012 1508   KETONESUR NEGATIVE 08/02/2022 0157   PROTEINUR NEGATIVE 08/02/2022 0157   NITRITE NEGATIVE 08/02/2022 0157   LEUKOCYTESUR SMALL (A) 08/02/2022 0157   LEUKOCYTESUR Negative 06/20/2012 1508    Radiological Exams on Admission: No results found.   Data Reviewed: Relevant notes from primary care and specialist visits, past discharge summaries as available in EHR, including Care Everywhere. Prior diagnostic testing as pertinent to current admission diagnoses Updated medications and problem lists for reconciliation ED course, including vitals, labs, imaging, treatment and response to treatment Triage notes, nursing and pharmacy notes and ED provider's notes Notable results as noted in HPI   Assessment and Plan: Hypocalcemia, symptomatic CKD 5 with secondary hyperparathyroidism Metabolic acidosis Patient symptomatic for palpitations, numbness and tingling hands and feet  Creatinine 2.41, bicarb 19, calcium 5.5 Received calcium gluconate in the ED Continue to recheck and replete as needed Nephrology consult to guide further therapy At last nephrology appointment 01/2023, peritoneal dialysis was discussed  Hypomagnesemia Possibly secondary to dehydration from recent diarrhea Received 2 g magnesium sulfate IV piggyback in the ED Continue oral magnesium Check and replete as needed  Generalized weakness Secondary to recent acute diarrheal illness and associated electrolyte derangements. IV hydration Correct electrolyte  abnormalities  Anemia of chronic kidney failure, stage 5 (HCC) Hemoglobin at baseline at 9.6  Essential hypertension Continue BP control with amlodipine, hydralazine, metoprolol spironolactone and torsemide  OSA on CPAP CPAP nightly  Chronic diastolic CHF (congestive heart failure) (HCC) Continue torsemide, spironolactone, metoprolol  Chronic obstructive pulmonary disease, unspecified (HCC) Not acutely exacerbated Continue home inhalers  Hypothyroidism Continue levothyroxine 150 mcg    DVT prophylaxis: Lovenox  Consults: Nephrology, Dr. Wynelle Link  Advance Care Planning:   Code Status: Prior   Family Communication: none  Disposition Plan: Back to previous home environment  Severity of Illness: The appropriate patient status for this patient is OBSERVATION. Observation status is judged to be reasonable and necessary in order to provide the required intensity of service to ensure the patient's safety. The patient's presenting symptoms, physical exam findings, and initial radiographic and laboratory data in the context of their medical condition is felt to place them at decreased risk for further clinical deterioration. Furthermore, it is anticipated that the patient will be medically stable for discharge from the hospital within 2 midnights of admission.   Author: Andris Baumann, MD 03/23/2023 10:28 PM  For on call review [email or web address].

## 2023-03-23 NOTE — Assessment & Plan Note (Addendum)
Continue BP control with amlodipine, hydralazine, metoprolol spironolactone and torsemide

## 2023-03-23 NOTE — Assessment & Plan Note (Signed)
Continue levothyroxine 150 mcg

## 2023-03-23 NOTE — Assessment & Plan Note (Signed)
Received 2 g magnesium sulfate IV piggyback in the ED Continue oral magnesium Check and replete as needed

## 2023-03-23 NOTE — ED Provider Notes (Signed)
[facility] Provider Note    Event Date/Time   First MD Initiated Contact with Patient 03/23/23 2020     (approximate)   History   Abnormal Labs   HPI  [name] is a 82 y.o. female   history of hypertension, hypothyroidism, hypocalcemia hypomagnesemia chronic kidney disease  Presents today after seeing her doctor because she feels like her heart rate has been a little bit high.  She recently had a sinus infection reports that that is gotten much better.  However she is continue to feel for several days if not weeks that she has had fatigue.  She has noticed in the last few days that she is getting some cramping in her muscles.  She reports she has had low calcium before that is been similar.  She feels fatigued a little bit weak in general.  No pain or discomfort anywhere.  No shortness of breath or chest pain.      Physical Exam   Triage Vital Signs: ED Triage Vitals  Enc Vitals Group     BP 03/23/23 1701 (!) 146/54     Pulse Rate 03/23/23 1701 77     Resp 03/23/23 1701 17     Temp 03/23/23 1701 98.1 F (36.7 C)     Temp Source 03/23/23 1701 Oral     SpO2 03/23/23 1701 96 %     Weight 03/23/23 1659 179 lb (81.2 kg)     Height 03/23/23 1659 5\' 2"  (1.575 m)     Head Circumference --      Peak Flow --      Pain Score 03/23/23 1659 0     Pain Loc --      Pain Edu? --      Excl. in GC? --     Most recent vital signs: Vitals:   03/23/23 2248 03/23/23 2300  BP: (!) 167/65 (!) 163/73  Pulse: 70 71  Resp: 17 20  Temp: 98.4 F (36.9 C)   SpO2: 95% 93%     General: Awake, no distress.  CV:  Good peripheral perfusion.  Normal tones and rate Resp:  Normal effort.  Clear bilateral Abd:  No distention.  Soft nontender nondistended Other:  No acute obvious flaccid muscle weakness, alteration mental status, or tetany.  She does report she has had a little bit of a tingly feeling in her hands and feet the last couple days   ED Results /  Procedures / Treatments   Labs (all labs ordered are listed, but only abnormal results are displayed) Labs Reviewed  CBC - Abnormal; Notable for the following components:      Result Value   RBC 3.36 (*)    Hemoglobin 9.6 (*)    HCT 30.0 (*)    All other components within normal limits  BASIC METABOLIC PANEL - Abnormal; Notable for the following components:   Sodium 133 (*)    CO2 19 (*)    BUN 62 (*)    Creatinine, Ser 2.41 (*)    Calcium 5.5 (*)    GFR, Estimated 20 (*)    All other components within normal limits  MAGNESIUM - Abnormal; Notable for the following components:   Magnesium 0.6 (*)    All other components within normal limits  TSH  CALCIUM, IONIZED  VITAMIN B12  VITAMIN D 25 HYDROXY (VIT D DEFICIENCY, FRACTURES)  MAGNESIUM  MAGNESIUM  BASIC METABOLIC PANEL  BASIC METABOLIC PANEL   EKG Reviewed inter  by me at 2355 heart rate 75 QRS 80 QTc 530 Sinus rhythm, mild prolongation of QT interval  RADIOLOGY     PROCEDURES:  Critical Care performed: No  Procedures   MEDICATIONS ORDERED IN ED: Medications  amLODipine (NORVASC) tablet 5 mg (has no administration in time range)  hydrALAZINE (APRESOLINE) tablet 100 mg (has no administration in time range)  metoprolol tartrate (LOPRESSOR) tablet 50 mg (has no administration in time range)  rosuvastatin (CRESTOR) tablet 40 mg (has no administration in time range)  spironolactone (ALDACTONE) tablet 25 mg (has no administration in time range)  sertraline (ZOLOFT) tablet 100 mg (has no administration in time range)  calcitRIOL (ROCALTROL) capsule 0.5 mcg (has no administration in time range)  levothyroxine (SYNTHROID) tablet 150 mcg (has no administration in time range)  pantoprazole (PROTONIX) EC tablet 40 mg (has no administration in time range)  meclizine (ANTIVERT) tablet 12.5 mg (has no administration in time range)  mometasone-formoterol (DULERA) 200-5 MCG/ACT inhaler 2 puff (has no administration in time  range)  albuterol (PROVENTIL) (2.5 MG/3ML) 0.083% nebulizer solution 2.5 mg (has no administration in time range)  acetaminophen (TYLENOL) tablet 650 mg (has no administration in time range)    Or  acetaminophen (TYLENOL) suppository 650 mg (has no administration in time range)  ondansetron (ZOFRAN) tablet 4 mg (has no administration in time range)    Or  ondansetron (ZOFRAN) injection 4 mg (has no administration in time range)  enoxaparin (LOVENOX) injection 30 mg (30 mg Subcutaneous Given 03/23/23 2331)  0.9 %  sodium chloride infusion ( Intravenous New Bag/Given 03/23/23 2329)  magnesium oxide (MAG-OX) tablet 800 mg (has no administration in time range)  calcium gluconate 2 g/ 100 mL sodium chloride IVPB (0 mg Intravenous Stopped 03/23/23 2221)  magnesium sulfate IVPB 2 g 50 mL (0 g Intravenous Stopped 03/23/23 2324)     IMPRESSION / MDM / ASSESSMENT AND PLAN / ED COURSE  I reviewed the triage vital signs and the nursing notes.                              Differential diagnosis includes, but is not limited to, hypocalcemia and hypomagnesemia.  Other potential concerns and causes could be issues such as anemia, hypovolemia, dehydration, medication reaction to antibiotic, etc.  However based on review of her previous history review of her doctors evaluation today and her clinical history it seems she is suffering from mild paresthesias muscle cramps and fatigue.  This is consistent with hypocalcemia.  Labs show a severely reduced calcium.  Will replete, in addition obtain magnesium but also shows severe deficit which we will replete  Consulted with her hospitalist, patient accepted to service by Dr. Para March.  Patient and her husband both understand agreeable with plan for admission  Patient's presentation is most consistent with acute complicated illness / injury requiring diagnostic workup.   The patient is on the cardiac monitor to evaluate for evidence of arrhythmia and/or significant  heart rate changes.  Patient will be admitted to the hospitalist service, patient agreeable with plan.  Consulted with the hospitalist and admitted due to concerns for critically low magnesium as well as calcium.     FINAL CLINICAL IMPRESSION(S) / ED DIAGNOSES   Final diagnoses:  Hypocalcemia  Hypomagnesemia     Rx / DC Orders   ED Discharge Orders     None        Note:  This document was prepared  using Conservation officer, historic buildings and may include unintentional dictation errors.   Sharyn Creamer, MD 03/27/23 [phone]

## 2023-03-23 NOTE — Assessment & Plan Note (Signed)
CPAP nightly

## 2023-03-23 NOTE — ED Triage Notes (Signed)
Pt sts that she was at the [facility] today and she had some labs done and said that the pt calcium was low.

## 2023-03-23 NOTE — Assessment & Plan Note (Signed)
Hemoglobin at baseline at 9.6

## 2023-03-23 NOTE — Assessment & Plan Note (Signed)
Patient presented as an acute visit to PCP with palpitations and weakness Likely secondary to electrolyte derangements from CKD 5 Correct electrolyte abnormalities Cardiac monitoring

## 2023-03-23 NOTE — Assessment & Plan Note (Addendum)
CKD 5 with secondary hyperparathyroidism Metabolic acidosis Patient symptomatic for palpitations, numbness and tingling hands and feet  Creatinine 2.41, bicarb 19, calcium 5.5 Received calcium gluconate in the ED Continue to recheck and replete as needed Nephrology consult to guide further therapy At last nephrology appointment 01/2023, peritoneal dialysis was discussed

## 2023-03-23 NOTE — Assessment & Plan Note (Signed)
Continue torsemide, spironolactone, metoprolol

## 2023-03-23 NOTE — Assessment & Plan Note (Signed)
Not acutely exacerbated Continue home inhalers.   

## 2023-03-23 NOTE — ED Notes (Signed)
Patient is sitting in a wheelchair with her husband beside her. Patient is calm, in good spirits, states she feels some "numbness in her hands and feet."

## 2023-03-24 ENCOUNTER — Encounter: Payer: Self-pay | Admitting: Internal Medicine

## 2023-03-24 ENCOUNTER — Other Ambulatory Visit: Payer: Self-pay

## 2023-03-24 DIAGNOSIS — I132 Hypertensive heart and chronic kidney disease with heart failure and with stage 5 chronic kidney disease, or end stage renal disease: Secondary | ICD-10-CM | POA: Diagnosis present

## 2023-03-24 DIAGNOSIS — R531 Weakness: Secondary | ICD-10-CM | POA: Diagnosis present

## 2023-03-24 DIAGNOSIS — E890 Postprocedural hypothyroidism: Secondary | ICD-10-CM | POA: Diagnosis present

## 2023-03-24 DIAGNOSIS — E872 Acidosis, unspecified: Secondary | ICD-10-CM | POA: Diagnosis present

## 2023-03-24 DIAGNOSIS — N185 Chronic kidney disease, stage 5: Secondary | ICD-10-CM | POA: Diagnosis present

## 2023-03-24 DIAGNOSIS — E878 Other disorders of electrolyte and fluid balance, not elsewhere classified: Secondary | ICD-10-CM | POA: Diagnosis present

## 2023-03-24 DIAGNOSIS — Z6832 Body mass index (BMI) 32.0-32.9, adult: Secondary | ICD-10-CM | POA: Diagnosis not present

## 2023-03-24 DIAGNOSIS — E785 Hyperlipidemia, unspecified: Secondary | ICD-10-CM | POA: Diagnosis present

## 2023-03-24 DIAGNOSIS — N2581 Secondary hyperparathyroidism of renal origin: Secondary | ICD-10-CM | POA: Diagnosis present

## 2023-03-24 DIAGNOSIS — Z79890 Hormone replacement therapy (postmenopausal): Secondary | ICD-10-CM | POA: Diagnosis not present

## 2023-03-24 DIAGNOSIS — K219 Gastro-esophageal reflux disease without esophagitis: Secondary | ICD-10-CM | POA: Diagnosis present

## 2023-03-24 DIAGNOSIS — E669 Obesity, unspecified: Secondary | ICD-10-CM | POA: Diagnosis present

## 2023-03-24 DIAGNOSIS — Z8249 Family history of ischemic heart disease and other diseases of the circulatory system: Secondary | ICD-10-CM | POA: Diagnosis not present

## 2023-03-24 DIAGNOSIS — D631 Anemia in chronic kidney disease: Secondary | ICD-10-CM

## 2023-03-24 DIAGNOSIS — Z9049 Acquired absence of other specified parts of digestive tract: Secondary | ICD-10-CM | POA: Diagnosis not present

## 2023-03-24 DIAGNOSIS — E1122 Type 2 diabetes mellitus with diabetic chronic kidney disease: Secondary | ICD-10-CM | POA: Diagnosis present

## 2023-03-24 DIAGNOSIS — I5032 Chronic diastolic (congestive) heart failure: Secondary | ICD-10-CM | POA: Diagnosis present

## 2023-03-24 DIAGNOSIS — R200 Anesthesia of skin: Secondary | ICD-10-CM | POA: Diagnosis present

## 2023-03-24 DIAGNOSIS — G4733 Obstructive sleep apnea (adult) (pediatric): Secondary | ICD-10-CM | POA: Diagnosis present

## 2023-03-24 DIAGNOSIS — K7581 Nonalcoholic steatohepatitis (NASH): Secondary | ICD-10-CM | POA: Diagnosis present

## 2023-03-24 DIAGNOSIS — Z85828 Personal history of other malignant neoplasm of skin: Secondary | ICD-10-CM | POA: Diagnosis not present

## 2023-03-24 DIAGNOSIS — J4489 Other specified chronic obstructive pulmonary disease: Secondary | ICD-10-CM | POA: Diagnosis present

## 2023-03-24 LAB — BASIC METABOLIC PANEL
Anion gap: 14 (ref 5–15)
Anion gap: 13 (ref 5–15)
BUN: 55 mg/dL — ABNORMAL HIGH (ref 8–23)
BUN: 61 mg/dL — ABNORMAL HIGH (ref 8–23)
CO2: 13 mmol/L — ABNORMAL LOW (ref 22–32)
CO2: 20 mmol/L — ABNORMAL LOW (ref 22–32)
Calcium: 5.4 mg/dL — CL (ref 8.9–10.3)
Calcium: 6.4 mg/dL — CL (ref 8.9–10.3)
Chloride: 111 mmol/L (ref 98–111)
Chloride: 106 mmol/L (ref 98–111)
Creatinine, Ser: 2.06 mg/dL — ABNORMAL HIGH (ref 0.44–1.00)
Creatinine, Ser: 2.23 mg/dL — ABNORMAL HIGH (ref 0.44–1.00)
GFR, Estimated: 24 mL/min — ABNORMAL LOW (ref >60)
GFR, Estimated: 21 mL/min — ABNORMAL LOW (ref >60)
Glucose, Bld: 119 mg/dL — ABNORMAL HIGH (ref 70–99)
Glucose, Bld: 111 mg/dL — ABNORMAL HIGH (ref 70–99)
Potassium: 3.5 mmol/L (ref 3.5–5.1)
Potassium: 3.8 mmol/L (ref 3.5–5.1)
Sodium: 138 mmol/L (ref 135–145)
Sodium: 139 mmol/L (ref 135–145)

## 2023-03-24 LAB — MAGNESIUM
Magnesium: 1.1 mg/dL — ABNORMAL LOW (ref 1.7–2.4)
Magnesium: 2 mg/dL (ref 1.7–2.4)

## 2023-03-24 LAB — ALBUMIN: Albumin: 3.6 g/dL (ref 3.5–5.0)

## 2023-03-24 LAB — VITAMIN B12: Vitamin B-12: 364 pg/mL (ref 180–914)

## 2023-03-24 LAB — CALCIUM: Calcium: 6.4 mg/dL — CL (ref 8.9–10.3)

## 2023-03-24 MED ORDER — CALCIUM GLUCONATE-NACL 1-0.675 GM/50ML-% IV SOLN
1.0000 g | Freq: Once | INTRAVENOUS | Status: AC
  Administered 2023-03-24: 1000 mg via INTRAVENOUS
  Filled 2023-03-24: qty 50

## 2023-03-24 MED ORDER — MAGNESIUM SULFATE 4 GM/100ML IV SOLN
4.0000 g | Freq: Once | INTRAVENOUS | Status: AC
  Administered 2023-03-24: 4 g via INTRAVENOUS
  Filled 2023-03-24: qty 100

## 2023-03-24 MED ORDER — MAGNESIUM SULFATE 50 % IJ SOLN: 6.0000 g | Freq: Once | INTRAVENOUS | Status: DC

## 2023-03-24 MED ORDER — MAGNESIUM SULFATE 2 GM/50ML IV SOLN
2.0000 g | Freq: Once | INTRAVENOUS | Status: DC
  Filled 2023-03-24: qty 50

## 2023-03-24 MED ORDER — CALCIUM GLUCONATE-NACL 2-0.675 GM/100ML-% IV SOLN
2.0000 g | Freq: Once | INTRAVENOUS | Status: AC
  Administered 2023-03-24: 2000 mg via INTRAVENOUS
  Filled 2023-03-24: qty 100

## 2023-03-24 MED ORDER — OYSTER SHELL CALCIUM/D3 500-5 MG-MCG PO TABS
1.0000 | ORAL_TABLET | Freq: Two times a day (BID) | ORAL | Status: DC
  Administered 2023-03-24 – 2023-03-27 (×7): 1 via ORAL
  Filled 2023-03-24 (×7): qty 1

## 2023-03-24 NOTE — Progress Notes (Signed)
[facility]  - [place] at [facility]   PATIENT NAME: [name]    MR#:  [id number]  DATE OF BIRTH:  1941/09/03  SUBJECTIVE:  daughter and husband at bedside. Patient getting IV calcium and IV magnesium drops. Overall feeling better. Tells me her tingling numbness in the hands and feet have resolved. Was sent to the hospital due to L abnormal electrolytes. Denies any other complaints otherwise fairly independent.    VITALS:  Blood pressure (!) 156/63, pulse 79, temperature 97.7 F (36.5 C), temperature source Oral, resp. rate 18, height 5\' 2"  (1.575 m), weight 80.2 kg, SpO2 96 %.  PHYSICAL EXAMINATION:   GENERAL:  82 y.o.-year-old patient with no acute distress. Obese LUNGS: Normal breath sounds bilaterally, no wheezing CARDIOVASCULAR: S1, S2 normal. No murmur   ABDOMEN: Soft, nontender, nondistended. Bowel sounds present.  EXTREMITIES: No  edema b/l.    NEUROLOGIC: nonfocal  patient is alert and awake SKIN: No obvious rash, lesion, or ulcer.   LABORATORY PANEL:  CBC Recent Labs  Lab 03/23/23 2026  WBC 9.8  HGB 9.6*  HCT 30.0*  PLT 202    Chemistries  Recent Labs  Lab 03/24/23 0117 03/24/23 0655  NA 138 139  K 3.5 3.8  CL 111 106  CO2 13* 20*  GLUCOSE 119* 111*  BUN 55* 61*  CREATININE 2.06* 2.23*  CALCIUM 5.4* 6.4*  MG 1.1*  --    Assessment and Plan [name] is a 82 y.o. female with medical history significant for CKD 5 with secondary hyperparathyroidism last seen by nephrology 01/2023, anemia of CKD, diastolic CHF, HTN, hypothyroidism, COPD, with past hospitalizations in September and November 2023 for severe symptomatic electrolyte derangements, who was sent to the ED for abnormal labs.  Patient initially presented to the clinic with weakness following a 2-day bout of diarrhea about 4 days prior.   Hypocalcemia, symptomatic CKD 5 with secondary hyperparathyroidism Metabolic acidosis --Patient symptomatic for palpitations, numbness  and tingling hands and feet  --Creatinine 2.41, bicarb 19, calcium 5.5 --Received calcium gluconate in the ED -Nephrology consult to guide further therapy --At last nephrology appointment 01/2023, peritoneal dialysis was discussed   Hypomagnesemia --Possibly secondary to dehydration from recent diarrhea --Received 2 g magnesium sulfate IV piggyback in the ED --Continue oral and IV magnesium --Check and replete as needed per pharmacy help   Generalized weakness --Secondary to recent acute diarrheal illness and associated electrolyte derangements. --Correct electrolyte abnormalities   Anemia of chronic kidney failure, stage 5 (HCC) --Hemoglobin at baseline at 9.6   Essential hypertension --Continue BP control with amlodipine, hydralazine, metoprolol spironolactone  --hold torsemide   OSA on CPAP CPAP nightly   Chronic diastolic CHF (congestive heart failure) (HCC) Continue  spironolactone, metoprolol HOLD torsemide  Chronic obstructive pulmonary disease, unspecified (HCC) Not acutely exacerbated Continue home inhalers   Hypothyroidism Continue levothyroxine 150 mcg       DVT prophylaxis: Lovenox   Consults: Nephrology, Dr. Wynelle Link   Advance Care Planning:   Code Status: Prior    Family Communication: dter at bedside   Disposition Plan: Back to previous home environment in 1-3 days once electrolytes are better     TOTAL TIME TAKING CARE OF THIS PATIENT: 35 minutes.  >50% time spent on counselling and coordination of care  Note: This dictation was prepared with Dragon dictation along with smaller phrase technology. Any transcriptional errors that result from this process are unintentional.  Enedina Finner M.D    [facility]  CC: Primary care physician; Lauro Regulus, MD

## 2023-03-24 NOTE — Progress Notes (Signed)
[facility]  ROUNDING NOTE   Subjective:   [name] was admitted to [facility] on 03/23/2023 for Hypocalcemia [E83.51] Hypomagnesemia [E83.42] Electrolyte abnormality [E87.8]  Patient's husband at bedside.   Patient presents with diarrhea, numbness and tingling of extremities and weakness. Patient found to have a serum calcium of 5.8 and admitted to [facility].   Objective:  Vital signs in last 24 hours:  Temp:  [97.7 F (36.5 C)-98.8 F (37.1 C)] 97.7 F (36.5 C) (06/22 1050) Pulse Rate:  [65-86] 79 (06/22 1050) Resp:  [17-24] 18 (06/22 1050) BP: (135-168)/(43-119) 156/63 (06/22 1050) SpO2:  [93 %-97 %] 96 % (06/22 1050) Weight:  [80.2 kg-81.2 kg] 80.2 kg (06/22 0640)  Weight change:  Filed Weights   03/23/23 1659 03/24/23 0640  Weight: 81.2 kg 80.2 kg    Intake/Output: I/O last 3 completed shifts: In: 498.6 [I.V.:498.6] Out: -    Intake/Output this shift:  Total I/O In: 862.3 [P.O.:480; I.V.:334.3; IV Piggyback:48.1] Out: -   Physical Exam: General: NAD, sitting at edge of the bed  Head: Normocephalic, atraumatic. Moist oral mucosal membranes  Eyes: Anicteric, PERRL  Neck: Supple, trachea midline  Lungs:  Clear to auscultation  Heart: Regular rate and rhythm  Abdomen:  Soft, nontender,   Extremities:  no peripheral edema.  Neurologic: Nonfocal, moving all four extremities  Skin: No lesions  Access: Left AVF    Basic Metabolic Panel: Recent Labs  Lab 03/23/23 2026 03/24/23 0117 03/24/23 0655  NA 133* 138 139  K 3.9 3.5 3.8  CL 102 111 106  CO2 19* 13* 20*  GLUCOSE 84 119* 111*  BUN 62* 55* 61*  CREATININE 2.41* 2.06* 2.23*  CALCIUM 5.5* 5.4* 6.4*  MG 0.6* 1.1*  --     Liver Function Tests: Recent Labs  Lab 03/24/23 0655  ALBUMIN 3.6   No results for input(s): "LIPASE", "AMYLASE" in the last 168 hours. No results for input(s): "AMMONIA" in the last 168 hours.  CBC: Recent Labs  Lab 03/23/23 2026  WBC 9.8  HGB 9.6*  HCT  30.0*  MCV 89.3  PLT 202    Cardiac Enzymes: No results for input(s): "CKTOTAL", "CKMB", "CKMBINDEX", "TROPONINI" in the last 168 hours.  BNP: Invalid input(s): "POCBNP"  CBG: No results for input(s): "GLUCAP" in the last 168 hours.  Microbiology: Results for orders placed or performed during the hospital encounter of 08/02/22  C Difficile Quick Screen w PCR reflex     Status: None   Collection Time: 08/02/22 12:50 PM   Specimen: STOOL  Result Value Ref Range Status   C Diff antigen NEGATIVE NEGATIVE Final   C Diff toxin NEGATIVE NEGATIVE Final   C Diff interpretation No C. difficile detected.  Final    Comment: Performed at [facility], [address] [place], [place] [address]  Gastrointestinal Panel by PCR , Stool     Status: None   Collection Time: 08/02/22 12:50 PM   Specimen: STOOL  Result Value Ref Range Status   Campylobacter species NOT DETECTED NOT DETECTED Final   Plesimonas shigelloides NOT DETECTED NOT DETECTED Final   Salmonella species NOT DETECTED NOT DETECTED Final   Yersinia enterocolitica NOT DETECTED NOT DETECTED Final   Vibrio species NOT DETECTED NOT DETECTED Final   Vibrio cholerae NOT DETECTED NOT DETECTED Final   Enteroaggregative E coli (EAEC) NOT DETECTED NOT DETECTED Final   Enteropathogenic E coli (EPEC) NOT DETECTED NOT DETECTED Final   Enterotoxigenic E coli (ETEC) NOT  DETECTED NOT DETECTED Final   Shiga like toxin producing E coli (STEC) NOT DETECTED NOT DETECTED Final   Shigella/Enteroinvasive E coli (EIEC) NOT DETECTED NOT DETECTED Final   Cryptosporidium NOT DETECTED NOT DETECTED Final   Cyclospora cayetanensis NOT DETECTED NOT DETECTED Final   Entamoeba histolytica NOT DETECTED NOT DETECTED Final   Giardia lamblia NOT DETECTED NOT DETECTED Final   Adenovirus F40/41 NOT DETECTED NOT DETECTED Final   Astrovirus NOT DETECTED NOT DETECTED Final   Norovirus GI/GII NOT DETECTED NOT DETECTED Final   Rotavirus A NOT DETECTED NOT  DETECTED Final   Sapovirus (I, II, IV, and V) NOT DETECTED NOT DETECTED Final    Comment: Performed at [facility], [address] [place], [place] [address]    Coagulation Studies: No results for input(s): "LABPROT", "INR" in the last 72 hours.  Urinalysis: No results for input(s): "COLORURINE", "LABSPEC", "PHURINE", "GLUCOSEU", "HGBUR", "BILIRUBINUR", "KETONESUR", "PROTEINUR", "UROBILINOGEN", "NITRITE", "LEUKOCYTESUR" in the last 72 hours.  Invalid input(s): "APPERANCEUR"    Imaging: No results found.   Medications:    sodium chloride Stopped (03/24/23 1102)    amLODipine  5 mg Oral Daily   calcitRIOL  0.5 mcg Oral BID   calcium-vitamin D  1 tablet Oral BID   enoxaparin (LOVENOX) injection  30 mg Subcutaneous Q24H   hydrALAZINE  100 mg Oral BID   levothyroxine  150 mcg Oral Q0600   magnesium oxide  800 mg Oral BID   metoprolol tartrate  50 mg Oral BID   mometasone-formoterol  2 puff Inhalation BID   pantoprazole  40 mg Oral QAC breakfast   rosuvastatin  40 mg Oral Daily   sertraline  100 mg Oral Daily   spironolactone  25 mg Oral Daily   acetaminophen **OR** acetaminophen, albuterol, meclizine, ondansetron **OR** ondansetron (ZOFRAN) IV  Assessment/ Plan:  [name] is a 82 y.o.  female with diabetes mellitus type II, hypertension, COPD, hypothyroidism, allergies, chronic congestive heart failure, GERD, sleep apnea, hyperlipidemia who is admitted to [facility] on 03/23/2023 for Hypocalcemia [E83.51] Hypomagnesemia [E83.42] Electrolyte abnormality [E87.8]  Chronic Kidney Disease stage IV: baseline creatinine of 2.4, GFR of 20.  - continue spironolactone - no acute indication for dialysis  Hypocalcemia: with history of hypercalcemia: PTH of 40 on 02/22/23.  - Started on calcitriol and calcium plus vitamin D.  - Holding home torsemide.   Hypertension with chronic kidney disease: home regimen of spironolactone, metoprolol, hydralazine, amlodipine,  torsemide.  - Holding torsemide due to hypocalcemia.   Anemia of chronic kidney disease: hemoglobin 9.6. Normocytic.   Diabetes mellitus type II with chronic kidney disease: well controlled with hemoglobin A1c of 5.9% on 01/22/23.    LOS: 0 Clemmie Buelna 6/22/20241:22 PM

## 2023-03-24 NOTE — Consult Note (Signed)
PHARMACY CONSULT NOTE - FOLLOW UP  Pharmacy Consult for Electrolyte Monitoring and Replacement   Recent Labs: Potassium (mmol/L)  Date Value  03/24/2023 3.8  06/20/2012 3.2 (L)   Magnesium (mg/dL)  Date Value  09/81/1914 2.0   Calcium (mg/dL)  Date Value  78/29/5621 6.4 (LL)   Calcium, Total (mg/dL)  Date Value  30/86/5784 9.4   Albumin (g/dL)  Date Value  69/62/9528 3.6  06/20/2012 3.8   Phosphorus (mg/dL)  Date Value  41/32/4401 2.8   Sodium (mmol/L)  Date Value  03/24/2023 139  08/11/2014 141  06/20/2012 142   Corrected Ca: 6.7 mg/dL  Assessment: [name] is a 82 y.o. female with PMH significant for CKD 5 with secondary hyperparathyroidism, anemia of CKD, diastolic CHF, HTN, hypothyroidism, COPD. She was hospitalized in September and November 2023 for severe symptomatic electrolyte derangements. She was sent to the ED for abnormal labs. Patient was symptomatic with Ca 5.5 and Mg 0.6 on presentation. Mg now WNL. Ca slowly correcting. Pharmacy has been consulted to monitor and replace electrolytes.  Goal of Therapy:  WNL  Plan:  Give calcium gluconate 2g x1 Continue calcitriol and PO Ca + VitD per nephrology Check BMP and Mg with AM labs  Celene Squibb, PharmD Clinical Pharmacist 03/24/2023 6:55 PM

## 2023-03-24 NOTE — Progress Notes (Signed)
Patient admitted to the unit and placed on telemetry. A skin assessment was completed by my self and [place]. Patient has some scratches, scabs, and bruising to the right arm. Patient also has some redness and yeast to the groin, buttocks, and under the belly. Patient in the bed, bed in the lowest position, call bell within reach, and husband at the bedside.

## 2023-03-24 NOTE — ED Notes (Signed)
Lab called to collect blood work.

## 2023-03-24 NOTE — Plan of Care (Signed)
  Problem: Clinical Measurements: Goal: Cardiovascular complication will be avoided Outcome: Progressing   Problem: Activity: Goal: Risk for activity intolerance will decrease Outcome: Progressing   Problem: Nutrition: Goal: Adequate nutrition will be maintained Outcome: Progressing   Problem: Coping: Goal: Level of anxiety will decrease Outcome: Progressing   Problem: Elimination: Goal: Will not experience complications related to bowel motility Outcome: Progressing Goal: Will not experience complications related to urinary retention Outcome: Progressing   Problem: Pain Managment: Goal: General experience of comfort will improve Outcome: Progressing   Problem: Safety: Goal: Ability to remain free from injury will improve Outcome: Progressing   

## 2023-03-24 NOTE — Consult Note (Signed)
PHARMACY CONSULT NOTE - FOLLOW UP  Pharmacy Consult for Electrolyte Monitoring and Replacement   Recent Labs: Potassium (mmol/L)  Date Value  03/24/2023 3.8  06/20/2012 3.2 (L)   Magnesium (mg/dL)  Date Value  16/07/9603 1.1 (L)   Calcium (mg/dL)  Date Value  54/06/8118 6.4 (LL)   Calcium, Total (mg/dL)  Date Value  14/78/2956 9.4   Albumin (g/dL)  Date Value  21/30/8657 3.6  06/20/2012 3.8   Phosphorus (mg/dL)  Date Value  84/69/6295 2.8   Sodium (mmol/L)  Date Value  03/24/2023 139  08/11/2014 141  06/20/2012 142     Assessment: 82 y.o. female with medical history significant for CKD 5 with secondary hyperparathyroidism last seen by nephrology 01/2023, anemia of CKD, diastolic CHF, HTN, hypothyroidism, COPD, with past hospitalizations in September and November 2023 for severe symptomatic electrolyte derangements, who was sent to the ED for abnormal labs. Ca was 5.5 and Mg < 1. Per note pt was symptomatic.   Goal of Therapy:  WNL  Plan:  Ca gluconate 2 g IV x 1 this morning followed by 1 g. Pt received Mg 6 g total and on mg oxide 800 mg BID. Will follow up with Ca/Mg/vitamin D level @1800 .   Ronnald Ramp ,PharmD Clinical Pharmacist 03/24/2023 8:56 AM

## 2023-03-24 NOTE — Assessment & Plan Note (Signed)
Secondary to recent acute diarrheal illness and associated electrolyte derangements. IV hydration Correct electrolyte abnormalities

## 2023-03-25 ENCOUNTER — Inpatient Hospital Stay: Payer: Medicare Other

## 2023-03-25 DIAGNOSIS — E878 Other disorders of electrolyte and fluid balance, not elsewhere classified: Secondary | ICD-10-CM | POA: Diagnosis not present

## 2023-03-25 LAB — BASIC METABOLIC PANEL
Anion gap: 9 (ref 5–15)
BUN: 46 mg/dL — ABNORMAL HIGH (ref 8–23)
CO2: 19 mmol/L — ABNORMAL LOW (ref 22–32)
Calcium: 7.1 mg/dL — ABNORMAL LOW (ref 8.9–10.3)
Chloride: 104 mmol/L (ref 98–111)
Creatinine, Ser: 1.88 mg/dL — ABNORMAL HIGH (ref 0.44–1.00)
GFR, Estimated: 26 mL/min — ABNORMAL LOW (ref >60)
Glucose, Bld: 108 mg/dL — ABNORMAL HIGH (ref 70–99)
Potassium: 3.8 mmol/L (ref 3.5–5.1)
Sodium: 141 mmol/L (ref 135–145)

## 2023-03-25 LAB — MAGNESIUM: Magnesium: 1.9 mg/dL (ref 1.7–2.4)

## 2023-03-25 LAB — VITAMIN D 25 HYDROXY (VIT D DEFICIENCY, FRACTURES): Vit D, 25-Hydroxy: 27.79 ng/mL — ABNORMAL LOW (ref 30–100)

## 2023-03-25 MED ORDER — TORSEMIDE 20 MG PO TABS
10.0000 mg | ORAL_TABLET | Freq: Every day | ORAL | Status: DC
  Administered 2023-03-26 – 2023-03-27 (×2): 10 mg via ORAL
  Filled 2023-03-25 (×2): qty 1

## 2023-03-25 MED ORDER — POTASSIUM CHLORIDE CRYS ER 20 MEQ PO TBCR
20.0000 meq | EXTENDED_RELEASE_TABLET | Freq: Once | ORAL | Status: AC
  Administered 2023-03-25: 20 meq via ORAL
  Filled 2023-03-25: qty 1

## 2023-03-25 MED ORDER — CALCIUM GLUCONATE-NACL 2-0.675 GM/100ML-% IV SOLN
2.0000 g | Freq: Once | INTRAVENOUS | Status: AC
  Administered 2023-03-25: 2000 mg via INTRAVENOUS
  Filled 2023-03-25 (×2): qty 100

## 2023-03-25 MED ORDER — ALBUTEROL SULFATE (2.5 MG/3ML) 0.083% IN NEBU
2.5000 mg | INHALATION_SOLUTION | RESPIRATORY_TRACT | Status: DC | PRN
  Administered 2023-03-25 – 2023-03-27 (×8): 2.5 mg via RESPIRATORY_TRACT
  Filled 2023-03-25 (×8): qty 3

## 2023-03-25 MED ORDER — FUROSEMIDE 10 MG/ML IJ SOLN
20.0000 mg | Freq: Once | INTRAMUSCULAR | Status: AC
  Administered 2023-03-25: 20 mg via INTRAVENOUS
  Filled 2023-03-25: qty 2

## 2023-03-25 MED ORDER — FLUTICASONE PROPIONATE 50 MCG/ACT NA SUSP
1.0000 | Freq: Every day | NASAL | Status: DC
  Administered 2023-03-25 – 2023-03-27 (×3): 1 via NASAL
  Filled 2023-03-25: qty 16

## 2023-03-25 NOTE — Consult Note (Signed)
PHARMACY CONSULT NOTE - FOLLOW UP  Pharmacy Consult for Electrolyte Monitoring and Replacement   Recent Labs: Potassium (mmol/L)  Date Value  03/25/2023 3.8  06/20/2012 3.2 (L)   Magnesium (mg/dL)  Date Value  96/29/5284 1.9   Calcium (mg/dL)  Date Value  13/24/4010 7.1 (L)   Calcium, Total (mg/dL)  Date Value  27/25/3664 9.4   Albumin (g/dL)  Date Value  40/34/7425 3.6  06/20/2012 3.8   Phosphorus (mg/dL)  Date Value  95/63/8756 2.8   Sodium (mmol/L)  Date Value  03/25/2023 141  08/11/2014 141  06/20/2012 142   Corrected Ca: 7.4 mg/dL  Assessment: [name] is a 82 y.o. female with PMH significant for CKD 5 with secondary hyperparathyroidism, anemia of CKD, diastolic CHF, HTN, hypothyroidism, COPD. She was hospitalized in September and November 2023 for severe symptomatic electrolyte derangements. She was sent to the ED for abnormal labs. Patient was symptomatic with Ca 5.5 and Mg 0.6 on presentation. Mg now WNL. Ca slowly correcting. PTH WNL. Vitamin D slightly low. Tingling/numbness seems to have resolved per notes. Pharmacy has been consulted to monitor and replace electrolytes.   Calcium-vitaminD 500-5 mcg BID Calcitriol 0.5 mg BID.  Fluids: NS @75  ml/hr.   Goal of Therapy:  WNL  Plan:  No replacement needed at this time. Since pt is started on calcitriol will forgot giving more calcium.  F/u with AM labs.   Ronnald Ramp, PharmD Clinical Pharmacist 03/25/2023 7:44 AM

## 2023-03-25 NOTE — Progress Notes (Signed)
[facility]  ROUNDING NOTE   Subjective:   [name] was admitted to [facility] on 03/23/2023 for Hypocalcemia [E83.51] Hypomagnesemia [E83.42] Electrolyte abnormality [E87.8]  Patient's husband at bedside.   Patient states she is having more shortness of breath.   Tingling and numbness have improved.   Calcium 7.1 (6.4)  Objective:  Vital signs in last 24 hours:  Temp:  [98.1 F (36.7 C)-98.5 F (36.9 C)] 98.4 F (36.9 C) (06/23 0848) Pulse Rate:  [70-82] 70 (06/23 0848) Resp:  [15-20] 16 (06/23 0848) BP: (147-167)/(45-78) 158/58 (06/23 0848) SpO2:  [94 %-97 %] 96 % (06/23 0848) FiO2 (%):  [21 %] 21 % (06/22 2116)  Weight change:  Filed Weights   03/23/23 1659 03/24/23 0640  Weight: 81.2 kg 80.2 kg    Intake/Output: I/O last 3 completed shifts: In: 2589.5 [P.O.:720; I.V.:1819.5; IV Piggyback:50] Out: -    Intake/Output this shift:  Total I/O In: 240 [P.O.:240] Out: -   Physical Exam: General: NAD, sitting at edge of the bed  Head: Normocephalic, atraumatic. Moist oral mucosal membranes  Eyes: Anicteric, PERRL  Neck: Supple, trachea midline  Lungs:  Diminished at bases  Heart: Regular rate and rhythm  Abdomen:  Soft, nontender,   Extremities:  no peripheral edema.  Neurologic: Nonfocal, moving all four extremities  Skin: No lesions  Access: Left AVF    Basic Metabolic Panel: Recent Labs  Lab 03/23/23 2026 03/24/23 0117 03/24/23 0655 03/24/23 1737 03/25/23 0443  NA 133* 138 139  --  141  K 3.9 3.5 3.8  --  3.8  CL 102 111 106  --  104  CO2 19* 13* 20*  --  19*  GLUCOSE 84 119* 111*  --  108*  BUN 62* 55* 61*  --  46*  CREATININE 2.41* 2.06* 2.23*  --  1.88*  CALCIUM 5.5* 5.4* 6.4* 6.4* 7.1*  MG 0.6* 1.1*  --  2.0 1.9     Liver Function Tests: Recent Labs  Lab 03/24/23 0655  ALBUMIN 3.6    No results for input(s): "LIPASE", "AMYLASE" in the last 168 hours. No results for input(s): "AMMONIA" in the last 168  hours.  CBC: Recent Labs  Lab 03/23/23 2026  WBC 9.8  HGB 9.6*  HCT 30.0*  MCV 89.3  PLT 202     Cardiac Enzymes: No results for input(s): "CKTOTAL", "CKMB", "CKMBINDEX", "TROPONINI" in the last 168 hours.  BNP: Invalid input(s): "POCBNP"  CBG: No results for input(s): "GLUCAP" in the last 168 hours.  Microbiology: Results for orders placed or performed during the hospital encounter of 08/02/22  C Difficile Quick Screen w PCR reflex     Status: None   Collection Time: 08/02/22 12:50 PM   Specimen: STOOL  Result Value Ref Range Status   C Diff antigen NEGATIVE NEGATIVE Final   C Diff toxin NEGATIVE NEGATIVE Final   C Diff interpretation No C. difficile detected.  Final    Comment: Performed at [facility], [address] [place], [place] [address]  Gastrointestinal Panel by PCR , Stool     Status: None   Collection Time: 08/02/22 12:50 PM   Specimen: STOOL  Result Value Ref Range Status   Campylobacter species NOT DETECTED NOT DETECTED Final   Plesimonas shigelloides NOT DETECTED NOT DETECTED Final   Salmonella species NOT DETECTED NOT DETECTED Final   Yersinia enterocolitica NOT DETECTED NOT DETECTED Final   Vibrio species NOT DETECTED NOT DETECTED Final   Vibrio  cholerae NOT DETECTED NOT DETECTED Final   Enteroaggregative E coli (EAEC) NOT DETECTED NOT DETECTED Final   Enteropathogenic E coli (EPEC) NOT DETECTED NOT DETECTED Final   Enterotoxigenic E coli (ETEC) NOT DETECTED NOT DETECTED Final   Shiga like toxin producing E coli (STEC) NOT DETECTED NOT DETECTED Final   Shigella/Enteroinvasive E coli (EIEC) NOT DETECTED NOT DETECTED Final   Cryptosporidium NOT DETECTED NOT DETECTED Final   Cyclospora cayetanensis NOT DETECTED NOT DETECTED Final   Entamoeba histolytica NOT DETECTED NOT DETECTED Final   Giardia lamblia NOT DETECTED NOT DETECTED Final   Adenovirus F40/41 NOT DETECTED NOT DETECTED Final   Astrovirus NOT DETECTED NOT DETECTED Final    Norovirus GI/GII NOT DETECTED NOT DETECTED Final   Rotavirus A NOT DETECTED NOT DETECTED Final   Sapovirus (I, II, IV, and V) NOT DETECTED NOT DETECTED Final    Comment: Performed at [facility], [address] [place], [place] [address]    Coagulation Studies: No results for input(s): "LABPROT", "INR" in the last 72 hours.  Urinalysis: No results for input(s): "COLORURINE", "LABSPEC", "PHURINE", "GLUCOSEU", "HGBUR", "BILIRUBINUR", "KETONESUR", "PROTEINUR", "UROBILINOGEN", "NITRITE", "LEUKOCYTESUR" in the last 72 hours.  Invalid input(s): "APPERANCEUR"    Imaging: DG Chest Port 1 View  Result Date: 03/25/2023 CLINICAL DATA:  Shortness of breath EXAM: PORTABLE CHEST 1 VIEW COMPARISON:  08/02/2022 FINDINGS: Cardiac shadow is enlarged. Vascular congestion is noted without significant edema. No focal infiltrate is seen. No bony abnormality is noted. IMPRESSION: Mild CHF. Electronically Signed   By: Alcide Clever M.D.   On: 03/25/2023 09:46     Medications:      amLODipine  5 mg Oral Daily   calcitRIOL  0.5 mcg Oral BID   calcium-vitamin D  1 tablet Oral BID   enoxaparin (LOVENOX) injection  30 mg Subcutaneous Q24H   fluticasone  1 spray Each Nare Daily   hydrALAZINE  100 mg Oral BID   levothyroxine  150 mcg Oral Q0600   magnesium oxide  800 mg Oral BID   metoprolol tartrate  50 mg Oral BID   mometasone-formoterol  2 puff Inhalation BID   pantoprazole  40 mg Oral QAC breakfast   rosuvastatin  40 mg Oral Daily   sertraline  100 mg Oral Daily   spironolactone  25 mg Oral Daily   acetaminophen **OR** acetaminophen, albuterol, meclizine, ondansetron **OR** ondansetron (ZOFRAN) IV  Assessment/ Plan:  [name] is a 82 y.o.  female with diabetes mellitus type II, hypertension, COPD, hypothyroidism, allergies, chronic congestive heart failure, GERD, sleep apnea, hyperlipidemia who is admitted to [facility] on 03/23/2023 for Hypocalcemia [E83.51] Hypomagnesemia  [E83.42] Electrolyte abnormality [E87.8]  Chronic Kidney Disease stage IV: baseline creatinine of 2.4, GFR of 20.  - continue spironolactone - no acute indication for dialysis - discontinue IV fluids due to pulmonary edema on CXR.   Hypocalcemia: with history of hypercalcemia: PTH of 40 on 02/22/23.  - Started on calcitriol and calcium plus vitamin D.  - Holding home torsemide.   Hypertension with chronic kidney disease: home regimen of spironolactone, metoprolol, hydralazine, amlodipine, torsemide.  - Holding torsemide due to hypocalcemia.   Anemia of chronic kidney disease: hemoglobin 9.6. Normocytic.   Diabetes mellitus type II with chronic kidney disease: well controlled with hemoglobin A1c of 5.9% on 01/22/23.    LOS: 1 Jp Eastham 6/23/202411:02 AM

## 2023-03-25 NOTE — Evaluation (Signed)
Physical Therapy Evaluation Patient Details Name: [name] MRN: [id number] DOB: 05/21/1941 Today's Date: 03/25/2023  History of Present Illness  [name] is an 82yoF who comes to [facility] on 03/23/23 after labs revealing of Ca++ 5.8- symptomatic of bilat foot/hand tingling, heart palpitations. PMH CKD 5 with secondary hyperparathyroidism, anemia of CKD, diastolic CHF, HTN, hypothyroidism, COPD.  Clinical Impression  Pt at EOB on entry, husband at bedside. Pt reports AMB to BR independently a couple times this morning, no device, no concern of balance. Pt reports feeling close to baseline for balance. Pt partakes in balance screening all without LOB. Pt AMB into hall and back, no balance or strength issues, but has limiting dyspnea which she says is a baseline phenomenon. Pt denies any need for DME or PT services at DC. Will sign off at this time and allow mobility needs to be managed by pt/family/NSG.         Recommendations for follow up therapy are one component of a multi-disciplinary discharge planning process, led by the attending physician.  Recommendations may be updated based on patient status, additional functional criteria and insurance authorization.  Follow Up Recommendations       Assistance Recommended at Discharge PRN  Patient can return home with the following       Equipment Recommendations None recommended by PT  Recommendations for Other Services       Functional Status Assessment Patient has not had a recent decline in their functional status     Precautions / Restrictions Precautions Precautions: None Restrictions Weight Bearing Restrictions: No      Mobility  Bed Mobility                    Transfers Overall transfer level: Modified independent Equipment used: None                    Ambulation/Gait Ambulation/Gait assistance: Modified independent (Device/Increase time) Gait Distance (Feet): 120 Feet Assistive device: None Gait  Pattern/deviations: WFL(Within Functional Limits)          Stairs            Wheelchair Mobility    Modified Rankin (Stroke Patients Only)       Balance                                             Pertinent Vitals/Pain Pain Assessment Pain Assessment: No/denies pain (feet a little sore)    Home Living Family/patient expects to be discharged to:: Private residence Living Arrangements: Spouse/significant other Available Help at Discharge: Family Type of Home: House Home Access: Stairs to enter Entrance Stairs-Rails: None Entrance Stairs-Number of Steps: 1   Home Layout: Laundry or work area in basement Home Equipment: Agricultural consultant (2 wheels);Cane - single point;BSC/3in1;Shower seat      Prior Function Prior Level of Function : Independent/Modified Independent;Driving             Mobility Comments: independent       Hand Dominance        Extremity/Trunk Assessment                Communication      Cognition Arousal/Alertness: Awake/alert Behavior During Therapy: WFL for tasks assessed/performed Overall Cognitive Status: Within Functional Limits for tasks assessed  General Comments General comments (skin integrity, edema, etc.): Is able to demonstrate static balance in normal stance with eyes closed, march in place unsupported, 360 degrees turns, all without LOB.    Exercises     Assessment/Plan    PT Assessment Patient does not need any further PT services  PT Problem List Decreased range of motion;Decreased activity tolerance;Decreased strength       PT Treatment Interventions      PT Goals (Current goals can be found in the Care Plan section)  Acute Rehab PT Goals PT Goal Formulation: All assessment and education complete, DC therapy    Frequency       Co-evaluation               AM-PAC PT "6 Clicks" Mobility  Outcome Measure Help  needed turning from your back to your side while in a flat bed without using bedrails?: A Little Help needed moving from lying on your back to sitting on the side of a flat bed without using bedrails?: A Little Help needed moving to and from a bed to a chair (including a wheelchair)?: A Little Help needed standing up from a chair using your arms (e.g., wheelchair or bedside chair)?: A Little Help needed to walk in hospital room?: A Little Help needed climbing 3-5 steps with a railing? : A Little 6 Click Score: 18    End of Session Equipment Utilized During Treatment: Gait belt Activity Tolerance: Patient tolerated treatment well;Patient limited by fatigue;No increased pain Patient left: in bed;with family/visitor present;with call bell/phone within reach   PT Visit Diagnosis: Difficulty in walking, not elsewhere classified (R26.2)    Time: 4098-1191 PT Time Calculation (min) (ACUTE ONLY): 8 min   Charges:   PT Evaluation $PT Eval Low Complexity: 1 Low         1:14 PM, 03/25/23 Rosamaria Lints, PT, DPT Physical Therapist - [facility]  [phone] (ASCOM)    Ab Leaming C 03/25/2023, 1:08 PM

## 2023-03-25 NOTE — Progress Notes (Addendum)
  Progress Note   Patient: [name] YNW:[id number] DOB: 11-17-1940 DOA: 03/23/2023     1 DOS: the patient was seen and examined on 03/25/2023 at 9:16AM      Brief hospital course: [name] is an 82 year old F with dCHF, hypothyroidism, COPD, and 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  Had had diarrhea prior to admission, then developed weakness and hand tingling, went to doctor and had labs that showed significant hypocalcemia and hypomagnesemia, sent to the ER.     Assessment and Plan: Hypocalcemia, symptomatic CKD 5 with secondary hyperparathyroidism Metabolic acidosis Hypomagnesemia, severe Creatinine 2.4 on admission, calcium 5.5 and magnesium 0.6.  In the setting of numbness and tingling in the hands and feet and palpitations and weakness.  Magnesium resolved, potassium still 7, albumin normal. - Consult nephrology, appreciate recommendations - Continue calcitriol, calcium carbonate - Repeat calcium gluconate today     Generalized weakness Due to diarrhea, hypocalcemia, hypomagnesemia  Anemia of chronic kidney failure, stage 5 (HCC) Hemoglobin stable at baseline, no clinical bleeding  Essential hypertension Blood pressure well-controlled - Continue morphine, hydralazine, metoprolol, spironolactone  OSA on CPAP CPAP nightly  Chronic diastolic CHF (congestive heart failure) (HCC) Woke up this morning with orthopnea, dyspnea.  Chest x-ray shows pulmonary edema in the setting of IV fluids given for diarrhea. - Stopped IV fluids - IV Lasix now - Resume home torsemide tomorrow  Chronic obstructive pulmonary disease, unspecified (HCC) -Continue home inhalers  Hypothyroidism -Continue levothyroxine  Obesity BMI 32       Subjective: Patient has some orthopnea and dyspnea today.  Still has some tingling in the hands.  No confusion, vomiting, passing out, seizures.,  No fever.     Physical Exam: BP (!)  123/47 (BP Location: Right Arm)   Pulse 67   Temp 98.5 F (36.9 C)   Resp 16   Ht 5\' 2"  (1.575 m)   Wt 80.2 kg   SpO2 95%   BMI 32.34 kg/m   Alert obese adult female, lying in bed, no acute distress RRR, no murmurs, no peripheral edema Respiratory normal, lungs clear without rales or wheezes Abdomen soft without tenderness palpation or guarding, no ascites or distention Attention normal, affect appropriate, judgment insight appear normal    Data Reviewed: Patient metabolic panel shows calcium up to 7.1, magnesium up to 1.9, creatinine down to 1.8 Chest x-ray, personally reviewed, shows large cardiomegaly, pulmonary edema bilaterally Vitamin D level low PTH 40/normal Discussed with nephrology  Family Communication: Husband at the bedside    Disposition: Status is: Inpatient Patient was admitted for severe electrolyte derangement  Despite several days of supplementation, calcium still low and she is still symptomatic.  In addition she has some congestion today pulmonary edema on chest x-ray Continue calcium supplements and given diuresis today        Author: Alberteen Sam, MD 03/25/2023 2:01 PM  For on call review [email or web address].

## 2023-03-26 ENCOUNTER — Other Ambulatory Visit (HOSPITAL_COMMUNITY): Payer: Self-pay

## 2023-03-26 DIAGNOSIS — I10 Essential (primary) hypertension: Secondary | ICD-10-CM

## 2023-03-26 DIAGNOSIS — E872 Acidosis, unspecified: Secondary | ICD-10-CM

## 2023-03-26 DIAGNOSIS — R531 Weakness: Secondary | ICD-10-CM | POA: Diagnosis not present

## 2023-03-26 DIAGNOSIS — E039 Hypothyroidism, unspecified: Secondary | ICD-10-CM

## 2023-03-26 DIAGNOSIS — E878 Other disorders of electrolyte and fluid balance, not elsewhere classified: Secondary | ICD-10-CM | POA: Diagnosis not present

## 2023-03-26 DIAGNOSIS — I5032 Chronic diastolic (congestive) heart failure: Secondary | ICD-10-CM

## 2023-03-26 LAB — RENAL FUNCTION PANEL
Albumin: 2.9 g/dL — ABNORMAL LOW (ref 3.5–5.0)
Anion gap: 8 (ref 5–15)
BUN: 40 mg/dL — ABNORMAL HIGH (ref 8–23)
CO2: 21 mmol/L — ABNORMAL LOW (ref 22–32)
Calcium: 8.1 mg/dL — ABNORMAL LOW (ref 8.9–10.3)
Chloride: 108 mmol/L (ref 98–111)
Creatinine, Ser: 1.85 mg/dL — ABNORMAL HIGH (ref 0.44–1.00)
GFR, Estimated: 27 mL/min — ABNORMAL LOW (ref >60)
Glucose, Bld: 111 mg/dL — ABNORMAL HIGH (ref 70–99)
Phosphorus: 2.1 mg/dL — ABNORMAL LOW (ref 2.5–4.6)
Potassium: 4.2 mmol/L (ref 3.5–5.1)
Sodium: 137 mmol/L (ref 135–145)

## 2023-03-26 LAB — CALCIUM, IONIZED: Calcium, Ionized, Serum: 3 mg/dL — ABNORMAL LOW (ref 4.5–5.6)

## 2023-03-26 LAB — MAGNESIUM: Magnesium: 1.6 mg/dL — ABNORMAL LOW (ref 1.7–2.4)

## 2023-03-26 MED ORDER — K PHOS MONO-SOD PHOS DI & MONO 155-852-130 MG PO TABS
500.0000 mg | ORAL_TABLET | Freq: Two times a day (BID) | ORAL | Status: AC
  Administered 2023-03-26 (×2): 500 mg via ORAL
  Filled 2023-03-26 (×2): qty 2

## 2023-03-26 MED ORDER — MAGNESIUM SULFATE 2 GM/50ML IV SOLN
2.0000 g | Freq: Once | INTRAVENOUS | Status: AC
  Administered 2023-03-26: 2 g via INTRAVENOUS
  Filled 2023-03-26: qty 50

## 2023-03-26 MED ORDER — MAGNESIUM SULFATE 2 GM/50ML IV SOLN
2.0000 g | Freq: Once | INTRAVENOUS | Status: DC
  Filled 2023-03-26: qty 50

## 2023-03-26 NOTE — TOC Benefit Eligibility Note (Signed)
Pharmacy Patient Advocate Encounter  Insurance verification completed.    The patient is insured through [employer] Part D  Ran test claim for Jardiance 10 mg and the current 30 day co-pay is $110.29 due to a Deductible.  Ran test claim for Farxiga 10 mg and Non Formulary   This test claim was processed through [employer]- copay amounts may vary at other pharmacies due to [employer], or as the patient moves through the different stages of their insurance plan.    Roland Earl, CPHT Pharmacy Patient Advocate Specialist [facility] Patient Advocate Team Direct Number: [phone]  Fax: [phone]

## 2023-03-26 NOTE — Progress Notes (Signed)
[facility]  ROUNDING NOTE   Subjective:   [name] was admitted to [facility] on 03/23/2023 for Hypocalcemia [E83.51] Hypomagnesemia [E83.42] Electrolyte abnormality [E87.8]  Patient sitting at bedside Husband at bedside.  Complains of sinus drainage  Respiratory status has improved   Calcium 8.1 (7.1) (6.4)  Objective:  Vital signs in last 24 hours:  Temp:  [98.1 F (36.7 C)-99.5 F (37.5 C)] 98.6 F (37 C) (06/24 1156) Pulse Rate:  [71-80] 71 (06/24 1156) Resp:  [16-20] 20 (06/24 1156) BP: (133-168)/(41-61) 133/42 (06/24 1156) SpO2:  [92 %-97 %] 92 % (06/24 1156) FiO2 (%):  [21 %] 21 % (06/24 0440)  Weight change:  Filed Weights   03/23/23 1659 03/24/23 0640  Weight: 81.2 kg 80.2 kg    Intake/Output: I/O last 3 completed shifts: In: 1641.5 [P.O.:720; I.V.:812.8; IV Piggyback:108.7] Out: 750 [Urine:750]   Intake/Output this shift:  Total I/O In: 360 [P.O.:360] Out: -   Physical Exam: General: NAD, sitting at edge of the bed  Head: Normocephalic, atraumatic. Moist oral mucosal membranes  Eyes: Anicteric  Lungs:  Diminished at bases  Heart: Regular rate and rhythm  Abdomen:  Soft, nontender  Extremities:  no peripheral edema.  Neurologic: Nonfocal, moving all four extremities  Skin: No lesions  Access: Left AVF    Basic Metabolic Panel: Recent Labs  Lab 03/23/23 2026 03/24/23 0117 03/24/23 0655 03/24/23 1737 03/25/23 0443 03/26/23 0405  NA 133* 138 139  --  141 137  K 3.9 3.5 3.8  --  3.8 4.2  CL 102 111 106  --  104 108  CO2 19* 13* 20*  --  19* 21*  GLUCOSE 84 119* 111*  --  108* 111*  BUN 62* 55* 61*  --  46* 40*  CREATININE 2.41* 2.06* 2.23*  --  1.88* 1.85*  CALCIUM 5.5* 5.4* 6.4* 6.4* 7.1* 8.1*  MG 0.6* 1.1*  --  2.0 1.9 1.6*  PHOS  --   --   --   --   --  2.1*     Liver Function Tests: Recent Labs  Lab 03/24/23 0655 03/26/23 0405  ALBUMIN 3.6 2.9*    No results for input(s): "LIPASE", "AMYLASE" in the last  168 hours. No results for input(s): "AMMONIA" in the last 168 hours.  CBC: Recent Labs  Lab 03/23/23 2026  WBC 9.8  HGB 9.6*  HCT 30.0*  MCV 89.3  PLT 202     Cardiac Enzymes: No results for input(s): "CKTOTAL", "CKMB", "CKMBINDEX", "TROPONINI" in the last 168 hours.  BNP: Invalid input(s): "POCBNP"  CBG: No results for input(s): "GLUCAP" in the last 168 hours.  Microbiology: Results for orders placed or performed during the hospital encounter of 08/02/22  C Difficile Quick Screen w PCR reflex     Status: None   Collection Time: 08/02/22 12:50 PM   Specimen: STOOL  Result Value Ref Range Status   C Diff antigen NEGATIVE NEGATIVE Final   C Diff toxin NEGATIVE NEGATIVE Final   C Diff interpretation No C. difficile detected.  Final    Comment: Performed at [facility], [address] [place], [place] [address]  Gastrointestinal Panel by PCR , Stool     Status: None   Collection Time: 08/02/22 12:50 PM   Specimen: STOOL  Result Value Ref Range Status   Campylobacter species NOT DETECTED NOT DETECTED Final   Plesimonas shigelloides NOT DETECTED NOT DETECTED Final   Salmonella species NOT DETECTED NOT DETECTED Final  Yersinia enterocolitica NOT DETECTED NOT DETECTED Final   Vibrio species NOT DETECTED NOT DETECTED Final   Vibrio cholerae NOT DETECTED NOT DETECTED Final   Enteroaggregative E coli (EAEC) NOT DETECTED NOT DETECTED Final   Enteropathogenic E coli (EPEC) NOT DETECTED NOT DETECTED Final   Enterotoxigenic E coli (ETEC) NOT DETECTED NOT DETECTED Final   Shiga like toxin producing E coli (STEC) NOT DETECTED NOT DETECTED Final   Shigella/Enteroinvasive E coli (EIEC) NOT DETECTED NOT DETECTED Final   Cryptosporidium NOT DETECTED NOT DETECTED Final   Cyclospora cayetanensis NOT DETECTED NOT DETECTED Final   Entamoeba histolytica NOT DETECTED NOT DETECTED Final   Giardia lamblia NOT DETECTED NOT DETECTED Final   Adenovirus F40/41 NOT DETECTED NOT  DETECTED Final   Astrovirus NOT DETECTED NOT DETECTED Final   Norovirus GI/GII NOT DETECTED NOT DETECTED Final   Rotavirus A NOT DETECTED NOT DETECTED Final   Sapovirus (I, II, IV, and V) NOT DETECTED NOT DETECTED Final    Comment: Performed at [facility], [address] [place], [place] [address]    Coagulation Studies: No results for input(s): "LABPROT", "INR" in the last 72 hours.  Urinalysis: No results for input(s): "COLORURINE", "LABSPEC", "PHURINE", "GLUCOSEU", "HGBUR", "BILIRUBINUR", "KETONESUR", "PROTEINUR", "UROBILINOGEN", "NITRITE", "LEUKOCYTESUR" in the last 72 hours.  Invalid input(s): "APPERANCEUR"    Imaging: DG Chest Port 1 View  Result Date: 03/25/2023 CLINICAL DATA:  Shortness of breath EXAM: PORTABLE CHEST 1 VIEW COMPARISON:  08/02/2022 FINDINGS: Cardiac shadow is enlarged. Vascular congestion is noted without significant edema. No focal infiltrate is seen. No bony abnormality is noted. IMPRESSION: Mild CHF. Electronically Signed   By: Alcide Clever M.D.   On: 03/25/2023 09:46     Medications:      amLODipine  5 mg Oral Daily   calcitRIOL  0.5 mcg Oral BID   calcium-vitamin D  1 tablet Oral BID   enoxaparin (LOVENOX) injection  30 mg Subcutaneous Q24H   fluticasone  1 spray Each Nare Daily   hydrALAZINE  100 mg Oral BID   levothyroxine  150 mcg Oral Q0600   magnesium oxide  800 mg Oral BID   metoprolol tartrate  50 mg Oral BID   mometasone-formoterol  2 puff Inhalation BID   pantoprazole  40 mg Oral QAC breakfast   phosphorus  500 mg Oral BID   rosuvastatin  40 mg Oral Daily   sertraline  100 mg Oral Daily   spironolactone  25 mg Oral Daily   torsemide  10 mg Oral Daily   acetaminophen **OR** acetaminophen, albuterol, meclizine, ondansetron **OR** ondansetron (ZOFRAN) IV  Assessment/ Plan:  [name] is a 82 y.o.  female with diabetes mellitus type II, hypertension, COPD, hypothyroidism, allergies, chronic congestive heart failure,  GERD, sleep apnea, hyperlipidemia who is admitted to [facility] on 03/23/2023 for Hypocalcemia [E83.51] Hypomagnesemia [E83.42] Electrolyte abnormality [E87.8]  Chronic Kidney Disease stage IV: baseline creatinine of 2.4, GFR of 20.  - Creatinine improved beyond stated baseline - continue spironolactone - no acute indication for dialysis - Will continue to monitor   Hypocalcemia: with history of hypercalcemia: PTH of 40 on 02/22/23.  - Started on calcitriol and calcium plus vitamin D.  - Holding home torsemide.  - Calcium 8.1  Hypertension with chronic kidney disease: home regimen of spironolactone, metoprolol, hydralazine, amlodipine, torsemide.  - Holding torsemide due to hypocalcemia.   Anemia of chronic kidney disease: hemoglobin 9.6. Normocytic.   Diabetes mellitus type II with chronic kidney disease: well  controlled with hemoglobin A1c of 5.9% on 01/22/23. Diet controlled   LOS: 2 Daquisha Clermont 6/24/202412:58 PM

## 2023-03-26 NOTE — Consult Note (Signed)
PHARMACY CONSULT NOTE - FOLLOW UP  Pharmacy Consult for Electrolyte Monitoring and Replacement   Recent Labs: Potassium (mmol/L)  Date Value  03/26/2023 4.2  06/20/2012 3.2 (L)   Magnesium (mg/dL)  Date Value  16/07/9603 1.6 (L)   Calcium (mg/dL)  Date Value  54/06/8118 8.1 (L)   Calcium, Total (mg/dL)  Date Value  14/78/2956 9.4   Albumin (g/dL)  Date Value  21/30/8657 2.9 (L)  06/20/2012 3.8   Phosphorus (mg/dL)  Date Value  84/69/6295 2.1 (L)   Sodium (mmol/L)  Date Value  03/26/2023 137  08/11/2014 141  06/20/2012 142   Corrected Ca: 9 mg/dL  Assessment: [name] is a 82 y.o. female with PMH significant for CKD 5 with secondary hyperparathyroidism, anemia of CKD, diastolic CHF, HTN, hypothyroidism, COPD. She was hospitalized in September and November 2023 for severe symptomatic electrolyte derangements. She was sent to the ED for abnormal labs. Patient was symptomatic with Ca 5.5 and Mg 0.6 on presentation. Ca slowly correcting. PTH WNL. Vitamin D slightly low. Tingling/numbness seems to have resolved per notes. Pharmacy has been consulted to monitor and replace electrolytes.   Calcium-vitaminD 500-5 mcg BID Calcitriol 0.5 mg BID.  Spironolactone 25 mg daily Fluids: NS @75  ml/hr. - stopped.   Started torsemide 10 mg daily 6/24   Goal of Therapy:  WNL  Plan:  Kphos 2 tabs x 2  Mg 2 g IV x 1  F/u with AM labs.   Ronnald Ramp, PharmD Clinical Pharmacist 03/26/2023 8:23 AM

## 2023-03-26 NOTE — Progress Notes (Signed)
Date and time results received: 03/26/23  (use smartphrase ".now" to insert current time)  Test: mag Critical Value: 1.6  Name of Provider Notified:Dr. Para March  Orders Received? Or Actions Taken?:  awaiting response

## 2023-03-26 NOTE — Progress Notes (Signed)
  Progress Note   Patient: [name] OZH:[id number] DOB: 1941/03/26 DOA: 03/23/2023     2 DOS: the patient was seen and examined on 03/26/2023 at 9:16AM      Brief hospital course: Mrs. 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  Had had diarrhea prior to admission, then developed weakness and hand tingling, went to doctor and had labs that showed significant hypocalcemia and hypomagnesemia, sent to the ER.  6/24: Mild hypomagnesemia and hypophosphatemia which are being repleted.  Kidney function stable.  Hypocalcemia improved with corrected calcium of 9 today.  Patient had 1 episode of diarrhea also want to wait another day in the hospital before discharge.  Assessment and Plan: Hypocalcemia, symptomatic CKD 5 with secondary hyperparathyroidism Metabolic acidosis Hypomagnesemia, severe Creatinine 2.4 on admission, calcium 5.5 and magnesium 0.6.  In the setting of numbness and tingling in the hands and feet and palpitations and weakness.  Mild hypomagnesemia and hypophosphatemia today.  Which are being repleted.  Calcium improved - Consult nephrology, appreciate recommendations - Continue calcitriol, calcium carbonate  Generalized weakness Due to diarrhea, hypocalcemia, hypomagnesemia Improving-no PT follow-up recommended  Anemia of chronic kidney failure, stage 5 (HCC) Hemoglobin stable at baseline, no clinical bleeding  Essential hypertension Blood pressure well-controlled - Continue morphine, hydralazine, metoprolol, spironolactone  OSA on CPAP CPAP nightly  Chronic diastolic CHF (congestive heart failure) (HCC) Woke up this morning with orthopnea, dyspnea.  Chest x-ray shows pulmonary edema in the setting of IV fluids given for diarrhea. - Stopped IV fluids, received one-time dose of IV Lasix 20 mg yesterday. - Resume home torsemide today  Chronic obstructive  pulmonary disease, unspecified (HCC) -Continue home inhalers  Hypothyroidism -Continue levothyroxine  Obesity BMI 32   Subjective: Patient was seen and examined today.  Having some postnasal drip and 1 episode of diarrhea since morning.  She wants to stay another night to make sure that she does not have more diarrhea..  Physical Exam: BP (!) 133/42 (BP Location: Right Arm)   Pulse 71   Temp 98.6 F (37 C) (Oral)   Resp 20   Ht 5\' 2"  (1.575 m)   Wt 80.2 kg   SpO2 92%   BMI 32.34 kg/m    General.  Obese elderly lady, in no acute distress. Pulmonary.  Lungs clear bilaterally, normal respiratory effort. CV.  Regular rate and rhythm, no JVD, rub or murmur. Abdomen.  Soft, nontender, nondistended, BS positive. CNS.  Alert and oriented .  No focal neurologic deficit. Extremities.  No edema, no cyanosis, pulses intact and symmetrical. Psychiatry.  Judgment and insight appears normal.    Data Reviewed: Prior data reviewed  Family Communication: Husband at the bedside    Disposition: Status is: Inpatient Patient was admitted for severe electrolyte derangement  Despite several days of supplementation, calcium still low and she is still symptomatic.  In addition she has some congestion today pulmonary edema on chest x-ray Continue calcium supplements and given diuresis today   DVT prophylaxis.  Lovenox  This record has been created using Conservation officer, historic buildings. Errors have been sought and corrected,but may not always be located. Such creation errors do not reflect on the standard of care.   Author: Arnetha Courser, MD 03/26/2023 2:46 PM  For on call review [email or web address].

## 2023-03-27 DIAGNOSIS — N189 Chronic kidney disease, unspecified: Secondary | ICD-10-CM | POA: Insufficient documentation

## 2023-03-27 DIAGNOSIS — R531 Weakness: Secondary | ICD-10-CM | POA: Diagnosis not present

## 2023-03-27 DIAGNOSIS — J449 Chronic obstructive pulmonary disease, unspecified: Secondary | ICD-10-CM

## 2023-03-27 DIAGNOSIS — G4733 Obstructive sleep apnea (adult) (pediatric): Secondary | ICD-10-CM

## 2023-03-27 DIAGNOSIS — E878 Other disorders of electrolyte and fluid balance, not elsewhere classified: Secondary | ICD-10-CM | POA: Diagnosis not present

## 2023-03-27 DIAGNOSIS — D631 Anemia in chronic kidney disease: Secondary | ICD-10-CM | POA: Insufficient documentation

## 2023-03-27 LAB — RENAL FUNCTION PANEL
Albumin: 3 g/dL — ABNORMAL LOW (ref 3.5–5.0)
Anion gap: 10 (ref 5–15)
BUN: 42 mg/dL — ABNORMAL HIGH (ref 8–23)
CO2: 21 mmol/L — ABNORMAL LOW (ref 22–32)
Calcium: 8.3 mg/dL — ABNORMAL LOW (ref 8.9–10.3)
Chloride: 106 mmol/L (ref 98–111)
Creatinine, Ser: 1.92 mg/dL — ABNORMAL HIGH (ref 0.44–1.00)
GFR, Estimated: 26 mL/min — ABNORMAL LOW (ref >60)
Glucose, Bld: 118 mg/dL — ABNORMAL HIGH (ref 70–99)
Phosphorus: 2.8 mg/dL (ref 2.5–4.6)
Potassium: 3.9 mmol/L (ref 3.5–5.1)
Sodium: 137 mmol/L (ref 135–145)

## 2023-03-27 LAB — MAGNESIUM: Magnesium: 1.7 mg/dL (ref 1.7–2.4)

## 2023-03-27 MED ORDER — MAGNESIUM OXIDE -MG SUPPLEMENT 400 (240 MG) MG PO TABS: 800.0000 mg | ORAL_TABLET | Freq: Two times a day (BID) | ORAL | 1 refills | Status: DC

## 2023-03-27 MED ORDER — VITAMIN D (ERGOCALCIFEROL) 1.25 MG (50000 UNIT) PO CAPS: 50000.0000 [IU] | ORAL_CAPSULE | ORAL | 0 refills | Status: DC

## 2023-03-27 MED ORDER — CALCITRIOL 0.5 MCG PO CAPS: 0.5000 ug | ORAL_CAPSULE | Freq: Three times a day (TID) | ORAL | 0 refills | Status: DC

## 2023-03-27 MED ORDER — MAGNESIUM SULFATE 2 GM/50ML IV SOLN
2.0000 g | Freq: Once | INTRAVENOUS | Status: AC
  Administered 2023-03-27: 2 g via INTRAVENOUS
  Filled 2023-03-27: qty 50

## 2023-03-27 MED ORDER — IPRATROPIUM-ALBUTEROL 0.5-2.5 (3) MG/3ML IN SOLN: 3.0000 mL | Freq: Four times a day (QID) | RESPIRATORY_TRACT | 0 refills | Status: DC | PRN

## 2023-03-27 NOTE — Care Management Important Message (Signed)
Important Message  Patient Details  Name: [name] MRN: [id number] Date of Birth: June 18, 1941   Medicare Important Message Given:  Yes  Patient signed initial consent for Medicare IM.  Copy of Medicare IM left with patient for reference, original to be scanned into chart.   Johnell Comings 03/27/2023, 11:28 AM

## 2023-03-27 NOTE — Progress Notes (Signed)
Patient called Clinical research associate to room at approx. 7 with c/o "I can't breath." Vital signs obtain with a stable O2 sat. Pt denied any chest pain but expressed pleuritic discomfort from "I got bronchitis." This Clinical research associate admin neb tx and Tylenol. Pt report fatigue and the need for "rest." Pt endorse being overwhelmed with caring for her spouse. "I am gonna have to get some help." Pt encouraged to have daughter pick up spouse from the hospital to allow patient to rest. Pt called her daughter on her cell phone. Daughter picked up Father from hospital around 4:00 am. Pt allowed to verbalize her concerns, words of support and encouragement provided.

## 2023-03-27 NOTE — Discharge Summary (Addendum)
Physician Discharge Summary   Patient: [name] MRN: [id number] DOB: [birth year]  Admit date:     03/23/2023  Discharge date: 03/27/23  Discharge Physician: Arnetha Courser   PCP: Lauro Regulus, MD   Recommendations at discharge:  Please obtain CBC, BMP and magnesium levels in 1 week We increased the dose of calcium supplement Follow-up with primary care provider Follow-up with nephrology  Discharge Diagnoses: Principal Problem:   Electrolyte abnormality Active Problems:   Hypocalcemia, symptomatic   Hypomagnesemia   Generalized weakness   Metabolic acidosis   CKD (chronic kidney disease) stage 5, GFR less than 15 ml/min (HCC)   Anemia of chronic kidney failure, stage 5 (HCC)   Essential hypertension   OSA on CPAP   Chronic diastolic CHF (congestive heart failure) (HCC)   Hypothyroidism   Chronic obstructive pulmonary disease, unspecified [facility])   Hospital Course: [name] is an 82 year old F with dCHF, hypothyroidism, COPD, and 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   Had had diarrhea prior to admission, then developed weakness and hand tingling, went to doctor and had labs that showed significant hypocalcemia and hypomagnesemia, sent to the ER.  Electrolytes were repleted and normalized before discharge. We increased the dose of home calcitriol, patient has an history of CKD stage V secondary hyperparathyroidism.  He received IV magnesium and also started on p.o. supplement for home.  Also found to have low vitamin D level and started on supplement.  Nephrology also evaluated her, no emergent need for dialysis, Patient need to continue outpatient follow-up with nephrology for further recommendations.  Patient will continue on current medications and need to have a close follow-up with her providers for further management.  Assessment and Plan: Hypocalcemia, symptomatic CKD 5 with secondary  hyperparathyroidism Metabolic acidosis Patient symptomatic for palpitations, numbness and tingling hands and feet  Creatinine 2.41, bicarb 19, calcium 5.5 Received calcium gluconate in the ED Calcium was normalized and her home dose calcitriol was increased on discharge.  She was also started on vitamin D supplement  Hypomagnesemia Possibly secondary to dehydration from recent diarrhea Received 2 g magnesium sulfate IV piggyback in the ED Continue oral magnesium Check and replete as needed  Generalized weakness Secondary to recent acute diarrheal illness and associated electrolyte derangements. IV hydration Correct electrolyte abnormalities  Anemia of chronic kidney failure, stage 5 (HCC) Hemoglobin at baseline at 9.6  Essential hypertension Continue BP control with amlodipine, hydralazine, metoprolol spironolactone and torsemide  OSA on CPAP CPAP nightly  Chronic diastolic CHF (congestive heart failure) (HCC) Continue torsemide, spironolactone, metoprolol  Chronic obstructive pulmonary disease, unspecified (HCC) Not acutely exacerbated Continue home inhalers  Hypothyroidism Continue levothyroxine 150 mcg   Consultants: Nephrology Procedures performed: None Disposition: Home health Diet recommendation:  Discharge Diet Orders (From admission, onward)     Start     Ordered   03/27/23 0000  Diet - low sodium heart healthy        03/27/23 1049           Cardiac diet DISCHARGE MEDICATION: Allergies as of 03/27/2023       Reactions   Ace Inhibitors    Other reaction(s): Unknown   Egg-derived Products Diarrhea   Other    Other reaction(s): Other (See Comments) Eggs   Prednisone    Other reaction(s): Other (See Comments) joint pain   Risedronate    Other reaction(s): Other (See Comments)   Sulfa Antibiotics Itching, Swelling   Other reaction(s): Other (  See Comments)   Sulfasalazine Other (See Comments)        Medication List     TAKE these  medications    acetaminophen 500 MG tablet Commonly known as: TYLENOL Take 1,000 mg by mouth every 6 (six) hours as needed (pain).   Advair Diskus 500-50 MCG/ACT Aepb Generic drug: fluticasone-salmeterol Inhale 1 puff into the lungs 2 (two) times daily. INHALE 1 PUFF TWICE A DAY AS DIRECTED **RINSE MOUTH AFTER USE**   albuterol 108 (90 Base) MCG/ACT inhaler Commonly known as: VENTOLIN HFA Inhale 2 puffs into the lungs every 6 (six) hours as needed for wheezing or shortness of breath.   amLODipine 5 MG tablet Commonly known as: NORVASC Take 5 mg by mouth daily.   azelastine 0.1 % nasal spray Commonly known as: ASTELIN ONE SPRAY INTO BOTH NOSTRILS TWICE DAILY   calcitRIOL 0.5 MCG capsule Commonly known as: ROCALTROL Take 1 capsule (0.5 mcg total) by mouth 3 (three) times daily. What changed: when to take this   CALCIUM & VIT D3 BONE HEALTH PO Take 1 tablet by mouth daily. 600 mg/ 25 mg   esomeprazole 40 MG packet Commonly known as: NEXIUM Take 40 mg by mouth daily before breakfast.   fluticasone 50 MCG/ACT nasal spray Commonly known as: FLONASE Place 2 sprays into both nostrils daily.   hydrALAZINE 100 MG tablet Commonly known as: APRESOLINE Take 100 mg by mouth 2 (two) times daily.   levothyroxine 150 MCG tablet Commonly known as: SYNTHROID Take 150 mcg by mouth daily.   lidocaine 5 % Commonly known as: Lidoderm Place 1 patch onto the skin every 12 (twelve) hours. Remove & Discard patch within 12 hours or as directed by MD   loperamide 2 MG capsule Commonly known as: IMODIUM Take 1 capsule (2 mg total) by mouth as needed for diarrhea or loose stools.   magnesium oxide 400 (240 Mg) MG tablet Commonly known as: MAG-OX Take 2 tablets (800 mg total) by mouth 2 (two) times daily.   meclizine 12.5 MG tablet Commonly known as: ANTIVERT Take 12.5 mg by mouth 3 (three) times daily as needed.   metoprolol tartrate 50 MG tablet Commonly known as: LOPRESSOR Take 50  mg by mouth 2 (two) times daily.   montelukast 10 MG tablet Commonly known as: SINGULAIR Take 10 mg by mouth at bedtime.   ondansetron 4 MG disintegrating tablet Commonly known as: ZOFRAN-ODT Take 4 mg by mouth every 8 (eight) hours as needed.   potassium chloride SA 20 MEQ tablet Commonly known as: KLOR-CON M Take 20 mEq by mouth 2 (two) times daily.   rosuvastatin 40 MG tablet Commonly known as: CRESTOR Take 40 mg by mouth daily.   sertraline 100 MG tablet Commonly known as: ZOLOFT Take 100 mg by mouth daily.   spironolactone 25 MG tablet Commonly known as: ALDACTONE Take 25 mg by mouth daily.   torsemide 10 MG tablet Commonly known as: DEMADEX Take 10 mg by mouth daily. Take 1-2 tablets daily               Discharge Care Instructions  (From admission, onward)           Start     Ordered   03/27/23 0000  No dressing needed        03/27/23 1049            Follow-up Information     Lauro Regulus, MD. Schedule an appointment as soon as possible for a visit  in 1 week(s).   Specialty: Internal Medicine Contact information: [address] [place] [address] [phone]                Discharge Exam: Ceasar Mons Weights   03/23/23 1659 03/24/23 0640  Weight: 81.2 kg 80.2 kg   General.  Frail elderly lady, in no acute distress. Pulmonary.  Lungs clear bilaterally, normal respiratory effort. CV.  Regular rate and rhythm, no JVD, rub or murmur. Abdomen.  Soft, nontender, nondistended, BS positive. CNS.  Alert and oriented .  No focal neurologic deficit. Extremities.  No edema, no cyanosis, pulses intact and symmetrical. Psychiatry.  Judgment and insight appears normal.   Condition at discharge: stable  The results of significant diagnostics from this hospitalization (including imaging, microbiology, ancillary and laboratory) are listed below for reference.   Imaging Studies: DG Chest Port 1 View  Result Date:  03/25/2023 CLINICAL DATA:  Shortness of breath EXAM: PORTABLE CHEST 1 VIEW COMPARISON:  08/02/2022 FINDINGS: Cardiac shadow is enlarged. Vascular congestion is noted without significant edema. No focal infiltrate is seen. No bony abnormality is noted. IMPRESSION: Mild CHF. Electronically Signed   By: Alcide Clever M.D.   On: 03/25/2023 09:46    Microbiology: Results for orders placed or performed during the hospital encounter of 08/02/22  C Difficile Quick Screen w PCR reflex     Status: None   Collection Time: 08/02/22 12:50 PM   Specimen: STOOL  Result Value Ref Range Status   C Diff antigen NEGATIVE NEGATIVE Final   C Diff toxin NEGATIVE NEGATIVE Final   C Diff interpretation No C. difficile detected.  Final    Comment: Performed at [facility], [address] [place], [place] [address]  Gastrointestinal Panel by PCR , Stool     Status: None   Collection Time: 08/02/22 12:50 PM   Specimen: STOOL  Result Value Ref Range Status   Campylobacter species NOT DETECTED NOT DETECTED Final   Plesimonas shigelloides NOT DETECTED NOT DETECTED Final   Salmonella species NOT DETECTED NOT DETECTED Final   Yersinia enterocolitica NOT DETECTED NOT DETECTED Final   Vibrio species NOT DETECTED NOT DETECTED Final   Vibrio cholerae NOT DETECTED NOT DETECTED Final   Enteroaggregative E coli (EAEC) NOT DETECTED NOT DETECTED Final   Enteropathogenic E coli (EPEC) NOT DETECTED NOT DETECTED Final   Enterotoxigenic E coli (ETEC) NOT DETECTED NOT DETECTED Final   Shiga like toxin producing E coli (STEC) NOT DETECTED NOT DETECTED Final   Shigella/Enteroinvasive E coli (EIEC) NOT DETECTED NOT DETECTED Final   Cryptosporidium NOT DETECTED NOT DETECTED Final   Cyclospora cayetanensis NOT DETECTED NOT DETECTED Final   Entamoeba histolytica NOT DETECTED NOT DETECTED Final   Giardia lamblia NOT DETECTED NOT DETECTED Final   Adenovirus F40/41 NOT DETECTED NOT DETECTED Final   Astrovirus NOT DETECTED NOT  DETECTED Final   Norovirus GI/GII NOT DETECTED NOT DETECTED Final   Rotavirus A NOT DETECTED NOT DETECTED Final   Sapovirus (I, II, IV, and V) NOT DETECTED NOT DETECTED Final    Comment: Performed at [facility], [address] [place] [place], [place] [address]    Labs: CBC: Recent Labs  Lab 03/23/23 2026  WBC 9.8  HGB 9.6*  HCT 30.0*  MCV 89.3  PLT 202   Basic Metabolic Panel: Recent Labs  Lab 03/24/23 0117 03/24/23 0655 03/24/23 1737 03/25/23 0443 03/26/23 0405 03/27/23 0522  NA 138 139  --  141 137 137  K 3.5 3.8  --  3.8 4.2 3.9  CL 111 106  --  104 108 106  CO2 13* 20*  --  19* 21* 21*  GLUCOSE 119* 111*  --  108* 111* 118*  BUN 55* 61*  --  46* 40* 42*  CREATININE 2.06* 2.23*  --  1.88* 1.85* 1.92*  CALCIUM 5.4* 6.4* 6.4* 7.1* 8.1* 8.3*  MG 1.1*  --  2.0 1.9 1.6* 1.7  PHOS  --   --   --   --  2.1* 2.8   Liver Function Tests: Recent Labs  Lab 03/24/23 0655 03/26/23 0405 03/27/23 0522  ALBUMIN 3.6 2.9* 3.0*   CBG: No results for input(s): "GLUCAP" in the last 168 hours.  Discharge time spent: greater than 30 minutes.  This record has been created using Conservation officer, historic buildings. Errors have been sought and corrected,but may not always be located. Such creation errors do not reflect on the standard of care.   Signed: Arnetha Courser, MD [facility] 03/27/2023

## 2023-03-27 NOTE — Progress Notes (Signed)
[facility]  ROUNDING NOTE   Subjective:   Ms. SAVANNHA WELLE was admitted to [facility] on 03/23/2023 for Hypocalcemia [E83.51] Hypomagnesemia [E83.42] Electrolyte abnormality [E87.8]  Resting in bed States she has a rough night Receiving a breathing treatment due to shortness of breath  Calcium 8.3 (8.1) (7.1) (6.4)  Objective:  Vital signs in last 24 hours:  Temp:  [97.9 F (36.6 C)-98.8 F (37.1 C)] 97.9 F (36.6 C) (06/25 0807) Pulse Rate:  [73-81] 74 (06/25 0807) Resp:  [18-21] 18 (06/25 0807) BP: (146-168)/(57-66) 166/58 (06/25 0807) SpO2:  [91 %-97 %] 91 % (06/25 0807) FiO2 (%):  [21 %] 21 % (06/24 2200)  Weight change:  Filed Weights   03/23/23 1659 03/24/23 0640  Weight: 81.2 kg 80.2 kg    Intake/Output: I/O last 3 completed shifts: In: 1163.7 [P.O.:1155; IV Piggyback:8.7] Out: 1100 [Urine:1100]   Intake/Output this shift:  No intake/output data recorded.  Physical Exam: General: NAD, laying in bed  Head: Normocephalic, atraumatic. Moist oral mucosal membranes  Eyes: Anicteric  Lungs:  Diminished at bases  Heart: Regular rate and rhythm  Abdomen:  Soft, nontender  Extremities:  no peripheral edema.  Neurologic: Nonfocal, moving all four extremities  Skin: No lesions  Access: Left AVF    Basic Metabolic Panel: Recent Labs  Lab 03/24/23 0117 03/24/23 0655 03/24/23 1737 03/25/23 0443 03/26/23 0405 03/27/23 0522  NA 138 139  --  141 137 137  K 3.5 3.8  --  3.8 4.2 3.9  CL 111 106  --  104 108 106  CO2 13* 20*  --  19* 21* 21*  GLUCOSE 119* 111*  --  108* 111* 118*  BUN 55* 61*  --  46* 40* 42*  CREATININE 2.06* 2.23*  --  1.88* 1.85* 1.92*  CALCIUM 5.4* 6.4* 6.4* 7.1* 8.1* 8.3*  MG 1.1*  --  2.0 1.9 1.6* 1.7  PHOS  --   --   --   --  2.1* 2.8     Liver Function Tests: Recent Labs  Lab 03/24/23 0655 03/26/23 0405 03/27/23 0522  ALBUMIN 3.6 2.9* 3.0*    No results for input(s): "LIPASE", "AMYLASE" in the last 168  hours. No results for input(s): "AMMONIA" in the last 168 hours.  CBC: Recent Labs  Lab 03/23/23 2026  WBC 9.8  HGB 9.6*  HCT 30.0*  MCV 89.3  PLT 202     Cardiac Enzymes: No results for input(s): "CKTOTAL", "CKMB", "CKMBINDEX", "TROPONINI" in the last 168 hours.  BNP: Invalid input(s): "POCBNP"  CBG: No results for input(s): "GLUCAP" in the last 168 hours.  Microbiology: Results for orders placed or performed during the hospital encounter of 08/02/22  C Difficile Quick Screen w PCR reflex     Status: None   Collection Time: 08/02/22 12:50 PM   Specimen: STOOL  Result Value Ref Range Status   C Diff antigen NEGATIVE NEGATIVE Final   C Diff toxin NEGATIVE NEGATIVE Final   C Diff interpretation No C. difficile detected.  Final    Comment: Performed at [facility], [address] [place], [place] [address]  Gastrointestinal Panel by PCR , Stool     Status: None   Collection Time: 08/02/22 12:50 PM   Specimen: STOOL  Result Value Ref Range Status   Campylobacter species NOT DETECTED NOT DETECTED Final   Plesimonas shigelloides NOT DETECTED NOT DETECTED Final   Salmonella species NOT DETECTED NOT DETECTED Final   Yersinia enterocolitica NOT DETECTED  NOT DETECTED Final   Vibrio species NOT DETECTED NOT DETECTED Final   Vibrio cholerae NOT DETECTED NOT DETECTED Final   Enteroaggregative E coli (EAEC) NOT DETECTED NOT DETECTED Final   Enteropathogenic E coli (EPEC) NOT DETECTED NOT DETECTED Final   Enterotoxigenic E coli (ETEC) NOT DETECTED NOT DETECTED Final   Shiga like toxin producing E coli (STEC) NOT DETECTED NOT DETECTED Final   Shigella/Enteroinvasive E coli (EIEC) NOT DETECTED NOT DETECTED Final   Cryptosporidium NOT DETECTED NOT DETECTED Final   Cyclospora cayetanensis NOT DETECTED NOT DETECTED Final   Entamoeba histolytica NOT DETECTED NOT DETECTED Final   Giardia lamblia NOT DETECTED NOT DETECTED Final   Adenovirus F40/41 NOT DETECTED NOT DETECTED  Final   Astrovirus NOT DETECTED NOT DETECTED Final   Norovirus GI/GII NOT DETECTED NOT DETECTED Final   Rotavirus A NOT DETECTED NOT DETECTED Final   Sapovirus (I, II, IV, and V) NOT DETECTED NOT DETECTED Final    Comment: Performed at [facility], [address] [place], [place] [address]    Coagulation Studies: No results for input(s): "LABPROT", "INR" in the last 72 hours.  Urinalysis: No results for input(s): "COLORURINE", "LABSPEC", "PHURINE", "GLUCOSEU", "HGBUR", "BILIRUBINUR", "KETONESUR", "PROTEINUR", "UROBILINOGEN", "NITRITE", "LEUKOCYTESUR" in the last 72 hours.  Invalid input(s): "APPERANCEUR"    Imaging: No results found.   Medications:      amLODipine  5 mg Oral Daily   calcitRIOL  0.5 mcg Oral BID   calcium-vitamin D  1 tablet Oral BID   enoxaparin (LOVENOX) injection  30 mg Subcutaneous Q24H   fluticasone  1 spray Each Nare Daily   hydrALAZINE  100 mg Oral BID   levothyroxine  150 mcg Oral Q0600   magnesium oxide  800 mg Oral BID   metoprolol tartrate  50 mg Oral BID   mometasone-formoterol  2 puff Inhalation BID   pantoprazole  40 mg Oral QAC breakfast   rosuvastatin  40 mg Oral Daily   sertraline  100 mg Oral Daily   spironolactone  25 mg Oral Daily   torsemide  10 mg Oral Daily   acetaminophen **OR** acetaminophen, albuterol, meclizine, ondansetron **OR** ondansetron (ZOFRAN) IV  Assessment/ Plan:  Ms. DEMI TRIEU is a 82 y.o.  female with diabetes mellitus type II, hypertension, COPD, hypothyroidism, allergies, chronic congestive heart failure, GERD, sleep apnea, hyperlipidemia who is admitted to [facility] on 03/23/2023 for Hypocalcemia [E83.51] Hypomagnesemia [E83.42] Electrolyte abnormality [E87.8]  Chronic Kidney Disease stage IV: baseline creatinine of 2.4, GFR of 20.  - Creatinine remains stable - continue spironolactone  - Patient has appt in our office on July 1 with Dr Cherylann Ratel.   Hypocalcemia: with history of hypercalcemia: PTH of  40 on 02/22/23.  - Started on calcitriol and calcium plus vitamin D.  - Holding home torsemide.  - Calcium 8.3  Hypertension with chronic kidney disease: home regimen of spironolactone, metoprolol, hydralazine, amlodipine, torsemide.  - Holding torsemide due to hypocalcemia.   Anemia of chronic kidney disease: hemoglobin 9.6. Normocytic.   Diabetes mellitus type II with chronic kidney disease: well controlled with hemoglobin A1c of 5.9% on 01/22/23. Diet controlled   LOS: 3 Lashauna Arpin 6/25/20242:51 PM

## 2023-03-27 NOTE — TOC Transition Note (Signed)
Transition of Care [facility]) - CM/SW Discharge Note   Patient Details  Name: [name] MRN: [id number] Date of Birth: Feb 25, 1941  Transition of Care [facility]) CM/SW Contact:  Margarito Liner, LCSW Phone Number: 03/27/2023, 12:58 PM   Clinical Narrative:  Patient has orders to discharge home today. CSW met with patient. No supports at bedside. CSW introduced role and explained that discharge planning would be discussed. PCP is Einar Crow, MD. Husband transports her to appointments. Patient can also drive if needed. Pharmacy is [facility]. No issues obtaining medications. Patient lives home with husband. No home health prior to admission but patient is interested in getting this. She has used [facility] in the past and would like referral sent to them. [facility] accepted for PT and RN. Patient asked for help with her husband and for someone to help her with home tasks. Gave contact information for [facility] and [facility]. Explained that these agencies are not affiliated with [facility]. Patient has a wheelchair, shower chair, rollator, RW, and cane. She does not have a nebulizer at home but was using one during assessment. Patient is requesting home nebulizer so ordered one through [employer]. No further concerns. Her niece will pick her up around 1:30. CSW signing off.   Final next level of care: Home w Home Health Services Barriers to Discharge: No Barriers Identified   Patient Goals and CMS Choice   Choice offered to / list presented to : Patient  Discharge Placement                  Patient to be transferred to facility by: Niece   Patient and family notified of of transfer: 03/27/23  Discharge Plan and Services Additional resources added to the After Visit Summary for                  DME Arranged: Nebulizer machine DME Agency: [employer] Date DME Agency Contacted: 03/27/23   Representative spoke with at DME Agency: Mickeal Needy HH Arranged: PT, RN [facility] Agency:  [facility] Date [facility] Agency Contacted: 03/27/23   Representative spoke with at [facility] Agency: Becky Sax  Social Determinants of Health (SDOH) Interventions SDOH Screenings   Food Insecurity: No Food Insecurity (03/24/2023)  Housing: Low Risk  (03/24/2023)  Transportation Needs: No Transportation Needs (03/24/2023)  Utilities: Not At Risk (03/24/2023)  Tobacco Use: Low Risk  (03/24/2023)     Readmission Risk Interventions    03/27/2023   12:56 PM  Readmission Risk Prevention Plan  Transportation Screening Complete  Medication Review (RN Care Manager) Complete  PCP or Specialist appointment within 3-5 days of discharge Complete  HRI or Home Care Consult Complete  SW Recovery Care/Counseling Consult Complete  Palliative Care Screening Not Applicable  Skilled Nursing Facility Not Applicable

## 2023-03-27 NOTE — Consult Note (Signed)
PHARMACY CONSULT NOTE - FOLLOW UP  Pharmacy Consult for Electrolyte Monitoring and Replacement   Recent Labs: Potassium (mmol/L)  Date Value  03/27/2023 3.9  06/20/2012 3.2 (L)   Magnesium (mg/dL)  Date Value  51/11/5850 1.7   Calcium (mg/dL)  Date Value  77/82/4235 8.3 (L)   Calcium, Total (mg/dL)  Date Value  36/14/4315 9.4   Albumin (g/dL)  Date Value  40/05/6760 3.0 (L)  06/20/2012 3.8   Phosphorus (mg/dL)  Date Value  95/06/3266 2.8   Sodium (mmol/L)  Date Value  03/27/2023 137  08/11/2014 141  06/20/2012 142   Corrected Ca: 9 mg/dL  Assessment: [name] is a 82 y.o. female with PMH significant for CKD 5 with secondary hyperparathyroidism, anemia of CKD, diastolic CHF, HTN, hypothyroidism, COPD. She was hospitalized in September and November 2023 for severe symptomatic electrolyte derangements. She was sent to the ED for abnormal labs. Patient was symptomatic with Ca 5.5 and Mg 0.6 on presentation. Ca slowly correcting. PTH WNL. Vitamin D slightly low. Tingling/numbness seems to have resolved per notes. Pharmacy has been consulted to monitor and replace electrolytes.   Calcium-vitaminD 500-5 mcg BID Calcitriol 0.5 mg BID.  Spironolactone 25 mg daily Fluids: NS @75  ml/hr. - stopped.   Started torsemide 10 mg daily 6/24   Goal of Therapy:  WNL  Plan:  Mg 2 g IV x 1  F/u with AM labs.   Ronnald Ramp, PharmD Clinical Pharmacist 03/27/2023 7:53 AM

## 2023-04-16 ENCOUNTER — Encounter: Payer: Self-pay | Admitting: Internal Medicine

## 2023-04-16 ENCOUNTER — Emergency Department: Payer: Medicare Other

## 2023-04-16 ENCOUNTER — Other Ambulatory Visit: Payer: Self-pay

## 2023-04-16 ENCOUNTER — Observation Stay: Payer: Medicare Other

## 2023-04-16 ENCOUNTER — Inpatient Hospital Stay
Admission: EM | Admit: 2023-04-16 | Discharge: 2023-04-22 | DRG: 682 | Disposition: A | Discharge status: Home Health | Payer: Medicare Other | Source: Ambulatory Visit | Attending: Obstetrics and Gynecology | Admitting: Obstetrics and Gynecology

## 2023-04-16 DIAGNOSIS — E878 Other disorders of electrolyte and fluid balance, not elsewhere classified: Secondary | ICD-10-CM | POA: Diagnosis present

## 2023-04-16 DIAGNOSIS — E1169 Type 2 diabetes mellitus with other specified complication: Secondary | ICD-10-CM | POA: Diagnosis present

## 2023-04-16 DIAGNOSIS — N179 Acute kidney failure, unspecified: Principal | ICD-10-CM | POA: Diagnosis present

## 2023-04-16 DIAGNOSIS — J4489 Other specified chronic obstructive pulmonary disease: Secondary | ICD-10-CM | POA: Diagnosis present

## 2023-04-16 DIAGNOSIS — J189 Pneumonia, unspecified organism: Secondary | ICD-10-CM | POA: Diagnosis present

## 2023-04-16 DIAGNOSIS — Z87442 Personal history of urinary calculi: Secondary | ICD-10-CM

## 2023-04-16 DIAGNOSIS — M810 Age-related osteoporosis without current pathological fracture: Secondary | ICD-10-CM | POA: Diagnosis present

## 2023-04-16 DIAGNOSIS — J449 Chronic obstructive pulmonary disease, unspecified: Secondary | ICD-10-CM | POA: Diagnosis present

## 2023-04-16 DIAGNOSIS — F32A Depression, unspecified: Secondary | ICD-10-CM | POA: Diagnosis present

## 2023-04-16 DIAGNOSIS — Z1152 Encounter for screening for COVID-19: Secondary | ICD-10-CM

## 2023-04-16 DIAGNOSIS — R531 Weakness: Secondary | ICD-10-CM | POA: Diagnosis not present

## 2023-04-16 DIAGNOSIS — I5032 Chronic diastolic (congestive) heart failure: Secondary | ICD-10-CM | POA: Diagnosis present

## 2023-04-16 DIAGNOSIS — I132 Hypertensive heart and chronic kidney disease with heart failure and with stage 5 chronic kidney disease, or end stage renal disease: Secondary | ICD-10-CM | POA: Diagnosis present

## 2023-04-16 DIAGNOSIS — Z803 Family history of malignant neoplasm of breast: Secondary | ICD-10-CM

## 2023-04-16 DIAGNOSIS — R8271 Bacteriuria: Secondary | ICD-10-CM | POA: Diagnosis present

## 2023-04-16 DIAGNOSIS — Z882 Allergy status to sulfonamides status: Secondary | ICD-10-CM

## 2023-04-16 DIAGNOSIS — R54 Age-related physical debility: Secondary | ICD-10-CM | POA: Diagnosis present

## 2023-04-16 DIAGNOSIS — Z888 Allergy status to other drugs, medicaments and biological substances status: Secondary | ICD-10-CM

## 2023-04-16 DIAGNOSIS — N2581 Secondary hyperparathyroidism of renal origin: Secondary | ICD-10-CM | POA: Diagnosis present

## 2023-04-16 DIAGNOSIS — Z7951 Long term (current) use of inhaled steroids: Secondary | ICD-10-CM

## 2023-04-16 DIAGNOSIS — I4891 Unspecified atrial fibrillation: Secondary | ICD-10-CM | POA: Diagnosis not present

## 2023-04-16 DIAGNOSIS — K7581 Nonalcoholic steatohepatitis (NASH): Secondary | ICD-10-CM | POA: Diagnosis present

## 2023-04-16 DIAGNOSIS — Z85828 Personal history of other malignant neoplasm of skin: Secondary | ICD-10-CM

## 2023-04-16 DIAGNOSIS — E669 Obesity, unspecified: Secondary | ICD-10-CM | POA: Diagnosis present

## 2023-04-16 DIAGNOSIS — E1122 Type 2 diabetes mellitus with diabetic chronic kidney disease: Secondary | ICD-10-CM | POA: Diagnosis present

## 2023-04-16 DIAGNOSIS — I5033 Acute on chronic diastolic (congestive) heart failure: Secondary | ICD-10-CM | POA: Diagnosis present

## 2023-04-16 DIAGNOSIS — Z9049 Acquired absence of other specified parts of digestive tract: Secondary | ICD-10-CM

## 2023-04-16 DIAGNOSIS — J9811 Atelectasis: Secondary | ICD-10-CM | POA: Diagnosis present

## 2023-04-16 DIAGNOSIS — Z90711 Acquired absence of uterus with remaining cervical stump: Secondary | ICD-10-CM

## 2023-04-16 DIAGNOSIS — I10 Essential (primary) hypertension: Secondary | ICD-10-CM | POA: Diagnosis present

## 2023-04-16 DIAGNOSIS — E785 Hyperlipidemia, unspecified: Secondary | ICD-10-CM | POA: Diagnosis present

## 2023-04-16 DIAGNOSIS — Z87440 Personal history of urinary (tract) infections: Secondary | ICD-10-CM

## 2023-04-16 DIAGNOSIS — Z8249 Family history of ischemic heart disease and other diseases of the circulatory system: Secondary | ICD-10-CM

## 2023-04-16 DIAGNOSIS — N184 Chronic kidney disease, stage 4 (severe): Secondary | ICD-10-CM | POA: Diagnosis present

## 2023-04-16 DIAGNOSIS — Z8616 Personal history of COVID-19: Secondary | ICD-10-CM

## 2023-04-16 DIAGNOSIS — D631 Anemia in chronic kidney disease: Secondary | ICD-10-CM | POA: Diagnosis present

## 2023-04-16 DIAGNOSIS — Z79890 Hormone replacement therapy (postmenopausal): Secondary | ICD-10-CM

## 2023-04-16 DIAGNOSIS — N185 Chronic kidney disease, stage 5: Secondary | ICD-10-CM | POA: Diagnosis present

## 2023-04-16 DIAGNOSIS — E890 Postprocedural hypothyroidism: Secondary | ICD-10-CM | POA: Diagnosis present

## 2023-04-16 DIAGNOSIS — K219 Gastro-esophageal reflux disease without esophagitis: Secondary | ICD-10-CM | POA: Diagnosis present

## 2023-04-16 DIAGNOSIS — Z79899 Other long term (current) drug therapy: Secondary | ICD-10-CM

## 2023-04-16 DIAGNOSIS — G4733 Obstructive sleep apnea (adult) (pediatric): Secondary | ICD-10-CM

## 2023-04-16 LAB — BASIC METABOLIC PANEL
Anion gap: 12 (ref 5–15)
BUN: 78 mg/dL — ABNORMAL HIGH (ref 8–23)
CO2: 25 mmol/L (ref 22–32)
Calcium: 14.5 mg/dL (ref 8.9–10.3)
Chloride: 96 mmol/L — ABNORMAL LOW (ref 98–111)
Creatinine, Ser: 6.17 mg/dL — ABNORMAL HIGH (ref 0.44–1.00)
GFR, Estimated: 6 mL/min — ABNORMAL LOW (ref >60)
Glucose, Bld: 111 mg/dL — ABNORMAL HIGH (ref 70–99)
Potassium: 4 mmol/L (ref 3.5–5.1)
Sodium: 133 mmol/L — ABNORMAL LOW (ref 135–145)

## 2023-04-16 LAB — URINALYSIS, ROUTINE W REFLEX MICROSCOPIC
Bilirubin Urine: NEGATIVE
Glucose, UA: NEGATIVE mg/dL
Ketones, ur: NEGATIVE mg/dL
Nitrite: NEGATIVE
Protein, ur: 100 mg/dL — AB
Specific Gravity, Urine: 1.008 (ref 1.005–1.030)
WBC, UA: >50 WBC/hpf (ref 0–5)
pH: 8 (ref 5.0–8.0)

## 2023-04-16 LAB — HEPATIC FUNCTION PANEL
ALT: 20 U/L (ref 0–44)
AST: 24 U/L (ref 15–41)
Albumin: 4.1 g/dL (ref 3.5–5.0)
Alkaline Phosphatase: 58 U/L (ref 38–126)
Bilirubin, Direct: 0.1 mg/dL (ref 0.0–0.2)
Indirect Bilirubin: 0.7 mg/dL (ref 0.3–0.9)
Total Bilirubin: 0.8 mg/dL (ref 0.3–1.2)
Total Protein: 8 g/dL (ref 6.5–8.1)

## 2023-04-16 LAB — CBC
HCT: 33.2 % — ABNORMAL LOW (ref 36.0–46.0)
Hemoglobin: 10.6 g/dL — ABNORMAL LOW (ref 12.0–15.0)
MCH: 28.6 pg (ref 26.0–34.0)
MCHC: 31.9 g/dL (ref 30.0–36.0)
MCV: 89.7 fL (ref 80.0–100.0)
Platelets: 198 10*3/uL (ref 150–400)
RBC: 3.7 MIL/uL — ABNORMAL LOW (ref 3.87–5.11)
RDW: 13.6 % (ref 11.5–15.5)
WBC: 7.2 10*3/uL (ref 4.0–10.5)
nRBC: 0 % (ref 0.0–0.2)

## 2023-04-16 LAB — SARS CORONAVIRUS 2 BY RT PCR: SARS Coronavirus 2 by RT PCR: NEGATIVE

## 2023-04-16 LAB — MAGNESIUM: Magnesium: 3.9 mg/dL — ABNORMAL HIGH (ref 1.7–2.4)

## 2023-04-16 LAB — BRAIN NATRIURETIC PEPTIDE: B Natriuretic Peptide: 273.4 pg/mL — ABNORMAL HIGH (ref 0.0–100.0)

## 2023-04-16 LAB — PHOSPHORUS: Phosphorus: 5.6 mg/dL — ABNORMAL HIGH (ref 2.5–4.6)

## 2023-04-16 MED ORDER — ACETAMINOPHEN 325 MG PO TABS: 650.0000 mg | ORAL_TABLET | Freq: Four times a day (QID) | ORAL | Status: DC | PRN

## 2023-04-16 MED ORDER — HYDRALAZINE HCL 50 MG PO TABS
100.0000 mg | ORAL_TABLET | Freq: Two times a day (BID) | ORAL | Status: DC
  Administered 2023-04-16 – 2023-04-20 (×8): 100 mg via ORAL
  Filled 2023-04-16 (×8): qty 2

## 2023-04-16 MED ORDER — AZITHROMYCIN 250 MG PO TABS
250.0000 mg | ORAL_TABLET | Freq: Every day | ORAL | Status: AC
  Administered 2023-04-17 – 2023-04-20 (×4): 250 mg via ORAL
  Filled 2023-04-16 (×4): qty 1

## 2023-04-16 MED ORDER — AZELASTINE HCL 0.1 % NA SOLN
1.0000 | Freq: Two times a day (BID) | NASAL | Status: DC
  Administered 2023-04-17 – 2023-04-22 (×11): 1 via NASAL
  Filled 2023-04-16: qty 30

## 2023-04-16 MED ORDER — SODIUM CHLORIDE 0.9 % IV BOLUS
1000.0000 mL | Freq: Once | INTRAVENOUS | Status: AC
  Administered 2023-04-16: 1000 mL via INTRAVENOUS

## 2023-04-16 MED ORDER — ONDANSETRON HCL 4 MG/2ML IJ SOLN
4.0000 mg | Freq: Three times a day (TID) | INTRAMUSCULAR | Status: DC | PRN
  Administered 2023-04-16 – 2023-04-17 (×2): 4 mg via INTRAVENOUS
  Filled 2023-04-16 (×2): qty 2

## 2023-04-16 MED ORDER — ROSUVASTATIN CALCIUM 10 MG PO TABS
40.0000 mg | ORAL_TABLET | Freq: Every day | ORAL | Status: DC
  Administered 2023-04-16 – 2023-04-22 (×7): 40 mg via ORAL
  Filled 2023-04-16 (×7): qty 4

## 2023-04-16 MED ORDER — ALBUTEROL SULFATE HFA 108 (90 BASE) MCG/ACT IN AERS
2.0000 | INHALATION_SPRAY | RESPIRATORY_TRACT | Status: DC | PRN
  Filled 2023-04-16: qty 6.7

## 2023-04-16 MED ORDER — SERTRALINE HCL 50 MG PO TABS
100.0000 mg | ORAL_TABLET | Freq: Every day | ORAL | Status: DC
  Administered 2023-04-16 – 2023-04-22 (×7): 100 mg via ORAL
  Filled 2023-04-16 (×7): qty 2

## 2023-04-16 MED ORDER — ENSURE ENLIVE PO LIQD
237.0000 mL | Freq: Two times a day (BID) | ORAL | Status: DC
  Administered 2023-04-17 – 2023-04-22 (×12): 237 mL via ORAL

## 2023-04-16 MED ORDER — AMLODIPINE BESYLATE 5 MG PO TABS
5.0000 mg | ORAL_TABLET | Freq: Every day | ORAL | Status: DC
  Administered 2023-04-16 – 2023-04-19 (×4): 5 mg via ORAL
  Filled 2023-04-16 (×4): qty 1

## 2023-04-16 MED ORDER — CALCITONIN (SALMON) 200 UNIT/ML IJ SOLN
4.0000 [IU]/kg | Freq: Two times a day (BID) | INTRAMUSCULAR | Status: DC
  Administered 2023-04-16: 296 [IU] via SUBCUTANEOUS
  Filled 2023-04-16: qty 1.48

## 2023-04-16 MED ORDER — LEVOTHYROXINE SODIUM 50 MCG PO TABS
150.0000 ug | ORAL_TABLET | Freq: Every day | ORAL | Status: DC
  Administered 2023-04-16 – 2023-04-22 (×7): 150 ug via ORAL
  Filled 2023-04-16 (×7): qty 1

## 2023-04-16 MED ORDER — AZITHROMYCIN 250 MG PO TABS
500.0000 mg | ORAL_TABLET | Freq: Every day | ORAL | Status: AC
  Administered 2023-04-16: 500 mg via ORAL
  Filled 2023-04-16: qty 2

## 2023-04-16 MED ORDER — GUAIFENESIN 100 MG/5ML PO LIQD
15.0000 mL | ORAL | Status: DC | PRN
  Administered 2023-04-16 – 2023-04-21 (×16): 15 mL via ORAL
  Filled 2023-04-16 (×16): qty 20
  Filled 2023-04-16: qty 15

## 2023-04-16 MED ORDER — MOMETASONE FURO-FORMOTEROL FUM 200-5 MCG/ACT IN AERO
2.0000 | INHALATION_SPRAY | Freq: Two times a day (BID) | RESPIRATORY_TRACT | Status: DC
  Administered 2023-04-17 – 2023-04-22 (×12): 2 via RESPIRATORY_TRACT
  Filled 2023-04-16: qty 8.8

## 2023-04-16 MED ORDER — PANTOPRAZOLE SODIUM 40 MG PO TBEC
40.0000 mg | DELAYED_RELEASE_TABLET | Freq: Every day | ORAL | Status: DC
  Administered 2023-04-17 – 2023-04-22 (×6): 40 mg via ORAL
  Filled 2023-04-16 (×6): qty 1

## 2023-04-16 MED ORDER — LOPERAMIDE HCL 2 MG PO CAPS: 2.0000 mg | ORAL_CAPSULE | ORAL | Status: DC | PRN

## 2023-04-16 MED ORDER — HEPARIN SODIUM (PORCINE) 5000 UNIT/ML IJ SOLN
5000.0000 [IU] | Freq: Three times a day (TID) | INTRAMUSCULAR | Status: DC
  Administered 2023-04-16 – 2023-04-22 (×17): 5000 [IU] via SUBCUTANEOUS
  Filled 2023-04-16 (×18): qty 1

## 2023-04-16 MED ORDER — FLUTICASONE PROPIONATE 50 MCG/ACT NA SUSP: 2.0000 | Freq: Every day | NASAL | Status: DC | PRN

## 2023-04-16 MED ORDER — METOPROLOL TARTRATE 50 MG PO TABS
50.0000 mg | ORAL_TABLET | Freq: Two times a day (BID) | ORAL | Status: DC
  Administered 2023-04-16 – 2023-04-22 (×11): 50 mg via ORAL
  Filled 2023-04-16 (×13): qty 1

## 2023-04-16 MED ORDER — METOPROLOL TARTRATE 5 MG/5ML IV SOLN
5.0000 mg | INTRAVENOUS | Status: DC | PRN
  Administered 2023-04-16: 5 mg via INTRAVENOUS
  Filled 2023-04-16: qty 5

## 2023-04-16 MED ORDER — HYDRALAZINE HCL 20 MG/ML IJ SOLN
5.0000 mg | INTRAMUSCULAR | Status: DC | PRN
  Filled 2023-04-16: qty 1

## 2023-04-16 MED ORDER — IPRATROPIUM-ALBUTEROL 0.5-2.5 (3) MG/3ML IN SOLN
3.0000 mL | Freq: Four times a day (QID) | RESPIRATORY_TRACT | Status: DC
  Administered 2023-04-16 – 2023-04-17 (×4): 3 mL via RESPIRATORY_TRACT
  Filled 2023-04-16 (×4): qty 3

## 2023-04-16 MED ORDER — CALCITONIN (SALMON) 200 UNIT/ML IJ SOLN
4.0000 [IU]/kg | Freq: Two times a day (BID) | INTRAMUSCULAR | Status: AC
  Administered 2023-04-17: 296 [IU] via SUBCUTANEOUS
  Filled 2023-04-16: qty 1.48

## 2023-04-16 MED ORDER — MONTELUKAST SODIUM 10 MG PO TABS
10.0000 mg | ORAL_TABLET | Freq: Every day | ORAL | Status: DC
  Administered 2023-04-16 – 2023-04-21 (×6): 10 mg via ORAL
  Filled 2023-04-16 (×6): qty 1

## 2023-04-16 NOTE — ED Provider Notes (Signed)
[facility] Provider Note    Event Date/Time   First MD Initiated Contact with Patient 04/16/23 1314     (approximate)   History   Weakness   HPI  PAZ FUENTES is a 82 y.o. female with a history of diastolic CHF, hypothyroidism, COPD, and CKD with secondary hyperparathyroidism, who presents with generalized weakness over the last 8 days, persistent course, associated with nonproductive cough.  The patient also reports some increased shortness of breath.  She denies any focal weakness or numbness in her extremities.  She has no fever, vomiting, abdominal pain, or diarrhea.  She denies any chest pain.  I reviewed the past medical records.  The patient was admitted in late June after presenting with generalized weakness.  Labs at that time showed hypocalcemia and hypomagnesemia which were repleted and normalized prior to discharge.   Physical Exam   Triage Vital Signs: ED Triage Vitals  Encounter Vitals Group     BP 04/16/23 1231 (!) 161/67     Systolic BP Percentile --      Diastolic BP Percentile --      Pulse Rate 04/16/23 1231 62     Resp 04/16/23 1231 17     Temp 04/16/23 1231 98.2 F (36.8 C)     Temp Source 04/16/23 1231 Oral     SpO2 04/16/23 1231 100 %     Weight 04/16/23 1232 163 lb (73.9 kg)     Height 04/16/23 1232 5\' 2"  (1.575 m)     Head Circumference --      Peak Flow --      Pain Score 04/16/23 1232 0     Pain Loc --      Pain Education --      Exclude from Growth Chart --     Most recent vital signs: Vitals:   04/16/23 1400 04/16/23 1500  BP: (!) 198/56 (!) 178/55  Pulse: 67 62  Resp: 12 (!) 23  Temp:    SpO2: 97% 96%     General: Alert and oriented, no distress.  CV:  Good peripheral perfusion.  Resp:  Normal effort.  Lungs CTAB. Abd:  No distention.  Other:  5/5 motor strength and intact sensation all extremities.  Normal coordination and speech.  Dry mucous membranes.   ED Results / Procedures / Treatments    Labs (all labs ordered are listed, but only abnormal results are displayed) Labs Reviewed  BASIC METABOLIC PANEL - Abnormal; Notable for the following components:      Result Value   Sodium 133 (*)    Chloride 96 (*)    Glucose, Bld 111 (*)    BUN 78 (*)    Creatinine, Ser 6.17 (*)    Calcium 14.5 (*)    GFR, Estimated 6 (*)    All other components within normal limits  CBC - Abnormal; Notable for the following components:   RBC 3.70 (*)    Hemoglobin 10.6 (*)    HCT 33.2 (*)    All other components within normal limits  MAGNESIUM - Abnormal; Notable for the following components:   Magnesium 3.9 (*)    All other components within normal limits  SARS CORONAVIRUS 2 BY RT PCR  HEPATIC FUNCTION PANEL  URINALYSIS, ROUTINE W REFLEX MICROSCOPIC  PHOSPHORUS  CBG MONITORING, ED     EKG  ED ECG REPORT I, Dionne Bucy, the attending physician, personally viewed and interpreted this ECG.  Date: 04/16/2023 EKG Time: 1237 Rate:  62 Rhythm: normal sinus rhythm QRS Axis: normal Intervals: normal ST/T Wave abnormalities: LVH with repolarization abnormality Narrative Interpretation: no evidence of acute ischemia    RADIOLOGY  Chest x-ray: I independently viewed and interpreted the images; there is a left lower lung opacity   PROCEDURES:  Critical Care performed: Yes, see critical care procedure note(s)  .Critical Care  Performed by: Dionne Bucy, MD Authorized by: Dionne Bucy, MD   Critical care provider statement:    Critical care time (minutes):  30   Critical care time was exclusive of:  Separately billable procedures and treating other patients   Critical care was necessary to treat or prevent imminent or life-threatening deterioration of the following conditions:  Metabolic crisis   Critical care was time spent personally by me on the following activities:  Development of treatment plan with patient or surrogate, discussions with consultants,  evaluation of patient's response to treatment, examination of patient, ordering and review of laboratory studies, ordering and review of radiographic studies, ordering and performing treatments and interventions, pulse oximetry, re-evaluation of patient's condition, review of old charts and obtaining history from patient or surrogate   Care discussed with: admitting provider      MEDICATIONS ORDERED IN ED: Medications  guaiFENesin (ROBITUSSIN) 100 MG/5ML liquid 15 mL (has no administration in time range)  calcitonin (MIACALCIN) injection 296 Units (has no administration in time range)  albuterol (VENTOLIN HFA) 108 (90 Base) MCG/ACT inhaler 2 puff (has no administration in time range)  ondansetron (ZOFRAN) injection 4 mg (has no administration in time range)  hydrALAZINE (APRESOLINE) injection 5 mg (has no administration in time range)  acetaminophen (TYLENOL) tablet 650 mg (has no administration in time range)  heparin injection 5,000 Units (has no administration in time range)  sodium chloride 0.9 % bolus 1,000 mL (1,000 mLs Intravenous New Bag/Given 04/16/23 1354)     IMPRESSION / MDM / ASSESSMENT AND PLAN / ED COURSE  I reviewed the triage vital signs and the nursing notes.  82 year old female with PMH as noted above and status post recent admission for hypocalcemia on calcitriol now presents with generalized weakness as well as some cough.  On exam she is hypertensive with otherwise normal vital signs.  Neurologic exam is nonfocal.  Mucous membranes are dry.  Lab workup from triage reveals hypercalcemia to 14.5.  Other electrolytes are unremarkable.  There is also AKI with a creatinine of 6.17.  CBC shows no acute findings.  Differential diagnosis includes, but is not limited to, hypercalcemia due to medication, dehydration, kidney disease.  We will give a fluid bolus as the first-line treatment as the patient appears clinically dehydrated.  Since the calcium is greater than 14, I have  also ordered calcitonin.  The patient has a listed allergy to risedronate, so IV bisphosphonate is contraindicated.  We will obtain a chest x-ray and COVID swab to evaluate the cough.  I anticipate inpatient admission.  Patient's presentation is most consistent with acute presentation with potential threat to life or bodily function.  The patient is on the cardiac monitor to evaluate for evidence of arrhythmia and/or significant heart rate changes.  ----------------------------------------- 3:09 PM on 04/16/2023 -----------------------------------------  Chest x-ray shows possible atelectasis vs consolidation.  COVID is negative.  I consulted Dr. Clyde Lundborg from the hospitalist service; based on our discussion he agrees to evaluate the patient for admission.   FINAL CLINICAL IMPRESSION(S) / ED DIAGNOSES   Final diagnoses:  Hypercalcemia     Rx / DC Orders  ED Discharge Orders     None        Note:  This document was prepared using Dragon voice recognition software and may include unintentional dictation errors.    Dionne Bucy, MD 04/16/23 1510

## 2023-04-16 NOTE — Progress Notes (Signed)
Pt went into afib on unit. MD notified. EKG obtained. EKG showed afib with RVR. Metoprolol given. HR back down to 111 after metoprolol. Pt aox4, asymptomatic throughout event. MD saw pt at bedside. Pt resting in bed with no complaints at this time

## 2023-04-16 NOTE — ED Notes (Signed)
Md informed of Calcium of 14.5

## 2023-04-16 NOTE — H&P (Addendum)
History and Physical    [name] ZOX:[id number] DOB: 16-Mar-1941 DOA: 04/16/2023  Referring MD/NP/PA:   PCP: Lauro Regulus, MD   Patient coming from:  The patient is coming from home.     Chief Complaint: Weakness  HPI: [name] is a 82 y.o. female with medical history significant of CKD-4, HTN, HLD, COPD, dCHF, hypothyroidism, OSA on CPAP, depression, who presents with weakness.   Patient was recently hospitalized from 6/21 - 6/25 due to hypocalcemia and hypomagnesemia, which were repleted and normalized prior to discharge. Pt was started on Vitamin D and Calcitriol, and magnesium oxide.  Patient states that she has generalized weakness for almost a week, which which has been progressively worsening.  No unilateral numbness or tinglings in extremities.  No facial droop or slurred speech.  Patient also reports cough with mild shortness of breath, denies chest pain, fever or chills.  She coughs up a lot of clear mucus.  She was seen in urgent care, and had negative PCR for COVID, flu and RSV.  Patient reports nausea, no vomiting, diarrhea or abdominal pain.  Denies symptoms of UTI.   Data reviewed independently and ED Course: pt was found to have calcium of 14.5, magnesium 3.9, worsening renal function with creatinine up from recent 1.92 on 03/27/2023  to 6.17, BUN 78, GFR 6, WBC 7.2, temperature normal, blood pressure 161/67, heart rate 62, RR 17, oxygen saturation 100% on room air.  Renal ultrasound is negative for hydronephrosis.  Patient is placed on telemetry bed for observation.  Chest x-ray: 1. Ill-defined opacity within the lateral left lung base, which may reflect atelectasis or pneumonia. 2. Cardiomegaly. 3. Aortic Atherosclerosis (ICD10-I70.0).     EKG: I have personally reviewed.  Sinus rhythm, QTc 430, LAD, poor IV progression,   Review of Systems:   General: no fevers, chills, no body weight gain, has fatigue HEENT: no blurry vision, hearing changes or sore  throat Respiratory: has dyspnea, coughing, no wheezing CV: no chest pain, no palpitations GI: no nausea, vomiting, abdominal pain, diarrhea, constipation GU: no dysuria, burning on urination, increased urinary frequency, hematuria  Ext: has trace leg edema Neuro: no unilateral weakness, numbness, or tingling, no vision change or hearing loss Skin: no rash, no skin tear. MSK: No muscle spasm, no deformity, no limitation of range of movement in spin Heme: No easy bruising.  Travel history: No recent long distant travel.   Allergy:  Allergies  Allergen Reactions   Ace Inhibitors     Other reaction(s): Unknown   Egg-Derived Products Diarrhea   Other     Other reaction(s): Other (See Comments) Eggs   Prednisone     Other reaction(s): Other (See Comments) joint pain   Risedronate     Other reaction(s): Other (See Comments)   Sulfa Antibiotics Itching and Swelling    Other reaction(s): Other (See Comments)   Sulfasalazine Other (See Comments)    Past Medical History:  Diagnosis Date   Actinic keratosis    Albuminuria    Anemia    Arthritis    Asthma    Basal cell carcinoma 04/12/2017   Above right lateral brow. Nodulocystic type. EDC   Cancer (HCC)    skin   Cataract cortical, senile    CHF (congestive heart failure) (HCC)    Diabetes mellitus without complication (HCC)    GERD (gastroesophageal reflux disease)    Hemorrhoids    History of kidney stones    Hyperlipidemia    Hypertension  Hypothyroidism    Lyme disease    No kidney function    OSA (obstructive sleep apnea)    Osteoporosis    Osteoporosis    Reflux esophagitis    Steatohepatitis    Steatohepatitis     Past Surgical History:  Procedure Laterality Date   ABDOMINAL HYSTERECTOMY     APPENDECTOMY     AV FISTULA PLACEMENT Left 05/19/2022   Procedure: ARTERIOVENOUS (AV) FISTULA CREATION ( BRACHIAL CEPHALIC );  Surgeon: Renford Dills, MD;  Location: [facility] ORS;  Service: Vascular;  Laterality:  Left;   CARDIAC CATHETERIZATION  1980   [facility]   CARDIAC CATHETERIZATION  08/13/2014   [facility]. no significant CAD, normal LVEDP.    CATARACT EXTRACTION     CHOLECYSTECTOMY     COLONOSCOPY     COLONOSCOPY WITH PROPOFOL N/A 12/07/2016   Procedure: COLONOSCOPY WITH PROPOFOL;  Surgeon: Christena Deem, MD;  Location: [facility] ENDOSCOPY;  Service: Endoscopy;  Laterality: N/A;   ESOPHAGOGASTRODUODENOSCOPY     ESOPHAGOGASTRODUODENOSCOPY (EGD) WITH PROPOFOL N/A 12/07/2016   Procedure: ESOPHAGOGASTRODUODENOSCOPY (EGD) WITH PROPOFOL;  Surgeon: Christena Deem, MD;  Location: [facility] ENDOSCOPY;  Service: Endoscopy;  Laterality: N/A;   ESOPHAGOGASTRODUODENOSCOPY (EGD) WITH PROPOFOL N/A 01/07/2018   Procedure: ESOPHAGOGASTRODUODENOSCOPY (EGD) WITH PROPOFOL;  Surgeon: Christena Deem, MD;  Location: [facility] ENDOSCOPY;  Service: Endoscopy;  Laterality: N/A;   EYE SURGERY     HEMORRHOID SURGERY     PARTIAL HYSTERECTOMY     THYROIDECTOMY N/A 08/25/2019   Procedure: THYROIDECTOMY EXTRACTION OF SUBTOTAL COMPONENT; PARATHYROID AUTOTRANSPLANT X1;  Surgeon: Duanne Guess, MD;  Location: [facility] ORS;  Service: General;  Laterality: N/A;  With Nerve Monitoring(RLN)   TONSILLECTOMY      Social History:  reports that she has never smoked. She has never used smokeless tobacco. She reports that she does not drink alcohol and does not use drugs.  Family History:  Family History  Problem Relation Age of Onset   Breast cancer Mother    Heart attack Father      Prior to Admission medications   Medication Sig Start Date End Date Taking? Authorizing Provider  acetaminophen (TYLENOL) 500 MG tablet Take 1,000 mg by mouth every 6 (six) hours as needed (pain).    [provider]  ADVAIR DISKUS 500-50 MCG/ACT AEPB Inhale 1 puff into the lungs 2 (two) times daily. INHALE 1 PUFF TWICE A DAY AS DIRECTED **RINSE MOUTH AFTER USE** 07/26/22   [provider]  albuterol (PROVENTIL HFA;VENTOLIN HFA) 108 (90 BASE) MCG/ACT  inhaler Inhale 2 puffs into the lungs every 6 (six) hours as needed for wheezing or shortness of breath.    [provider]  amLODipine (NORVASC) 5 MG tablet Take 5 mg by mouth daily. 05/23/22   [provider]  azelastine (ASTELIN) 0.1 % nasal spray ONE SPRAY INTO BOTH NOSTRILS TWICE DAILY 09/14/20   [provider]  calcitRIOL (ROCALTROL) 0.5 MCG capsule Take 1 capsule (0.5 mcg total) by mouth 3 (three) times daily. 03/27/23   Arnetha Courser, MD  esomeprazole (NEXIUM) 40 MG packet Take 40 mg by mouth daily before breakfast.    [provider]  fluticasone (FLONASE) 50 MCG/ACT nasal spray Place 2 sprays into both nostrils daily.    [provider]  hydrALAZINE (APRESOLINE) 100 MG tablet Take 100 mg by mouth 2 (two) times daily.    [provider]  ipratropium-albuterol (DUONEB) 0.5-2.5 (3) MG/3ML SOLN Take 3 mLs by nebulization every 6 (six) hours as needed (SOB).  03/27/23   Arnetha Courser, MD  levothyroxine (SYNTHROID) 150 MCG tablet Take 150 mcg by mouth daily. 07/18/22   [provider]  lidocaine (LIDODERM) 5 % Place 1 patch onto the skin every 12 (twelve) hours. Remove & Discard patch within 12 hours or as directed by MD Patient not taking: Reported on 07/10/2022 06/08/22   Sharman Cheek, MD  loperamide (IMODIUM) 2 MG capsule Take 1 capsule (2 mg total) by mouth as needed for diarrhea or loose stools. 08/05/22   Arnetha Courser, MD  magnesium oxide (MAG-OX) 400 (240 Mg) MG tablet Take 2 tablets (800 mg total) by mouth 2 (two) times daily. 03/27/23   Arnetha Courser, MD  meclizine (ANTIVERT) 12.5 MG tablet Take 12.5 mg by mouth 3 (three) times daily as needed. Patient not taking: Reported on 03/24/2023 07/31/22   [provider]  metoprolol tartrate (LOPRESSOR) 50 MG tablet Take 50 mg by mouth 2 (two) times daily.  08/27/17   [provider]  montelukast (SINGULAIR) 10 MG tablet Take 10 mg by mouth at bedtime.    [provider]  Multiple Minerals-Vitamins (CALCIUM & VIT D3 BONE HEALTH PO) Take 1 tablet by mouth daily. 600 mg/ 25 mg    [provider]  ondansetron (ZOFRAN-ODT) 4 MG disintegrating tablet Take 4 mg by mouth every 8 (eight) hours as needed. Patient not taking: Reported on 03/24/2023 07/31/22   [provider]  potassium chloride SA (KLOR-CON M) 20 MEQ tablet Take 20 mEq by mouth 2 (two) times daily. Patient not taking: Reported on 03/24/2023    [provider]  rosuvastatin (CRESTOR) 40 MG tablet Take 40 mg by mouth daily.    [provider]  sertraline (ZOLOFT) 100 MG tablet Take 100 mg by mouth daily.    [provider]  spironolactone (ALDACTONE) 25 MG tablet Take 25 mg by mouth daily. 07/06/22   [provider]  torsemide (DEMADEX) 10 MG tablet Take 10 mg by mouth daily. Take 1-2 tablets daily    [provider]  Vitamin D, Ergocalciferol, (DRISDOL) 1.25 MG (50000 UNIT) CAPS capsule Take 1 capsule (50,000 Units total) by mouth every 7 (seven) days. 03/27/23   Arnetha Courser, MD    Physical Exam: Vitals:   04/16/23 1500 04/16/23 1530 04/16/23 1600 04/16/23 1650  BP: (!) 178/55 (!) 195/51 (!) 174/78 (!) 179/54  Pulse: 62 65 69 72  Resp: (!) 23 (!) 23 (!) 21 18  Temp:    97.9 F (36.6 C)  TempSrc:    Oral  SpO2: 96% 97% 95% 96%  Weight:      Height:       General: Not in acute distress HEENT:       Eyes: PERRL, EOMI, no jaundice       ENT: No discharge from the ears and nose, no pharynx injection, no tonsillar enlargement.        Neck: No JVD, no bruit, no mass felt. Heme: No neck lymph node enlargement. Cardiac: S1/S2, RRR, No murmurs, No gallops or rubs. Respiratory: No rales, wheezing, rhonchi or rubs. GI: Soft, nondistended, nontender, no rebound pain, no organomegaly, BS present. GU: No hematuria Ext: has trace leg edema bilaterally. 1+DP/PT pulse bilaterally. Musculoskeletal: No joint deformities, No joint  redness or warmth, no limitation of ROM in spin. Skin: No rashes.  Neuro: Alert, oriented X3, cranial nerves II-XII grossly intact, moves all extremities normally. Psych: Patient is not psychotic, no suicidal or hemocidal ideation.  Labs on Admission:  I have personally reviewed following labs and imaging studies  CBC: Recent Labs  Lab 04/16/23 1235  WBC 7.2  HGB 10.6*  HCT 33.2*  MCV 89.7  PLT 198   Basic Metabolic Panel: Recent Labs  Lab 04/16/23 1235  NA 133*  K 4.0  CL 96*  CO2 25  GLUCOSE 111*  BUN 78*  CREATININE 6.17*  CALCIUM 14.5*  MG 3.9*  PHOS 5.6*   GFR: Estimated Creatinine Clearance: 6.6 mL/min (A) (by C-G formula based on SCr of 6.17 mg/dL (H)). Liver Function Tests: Recent Labs  Lab 04/16/23 1235  AST 24  ALT 20  ALKPHOS 58  BILITOT 0.8  PROT 8.0  ALBUMIN 4.1   No results for input(s): "LIPASE", "AMYLASE" in the last 168 hours. No results for input(s): "AMMONIA" in the last 168 hours. Coagulation Profile: No results for input(s): "INR", "PROTIME" in the last 168 hours. Cardiac Enzymes: No results for input(s): "CKTOTAL", "CKMB", "CKMBINDEX", "TROPONINI" in the last 168 hours. BNP (last 3 results) No results for input(s): "PROBNP" in the last 8760 hours. HbA1C: No results for input(s): "HGBA1C" in the last 72 hours. CBG: No results for input(s): "GLUCAP" in the last 168 hours. Lipid Profile: No results for input(s): "CHOL", "HDL", "LDLCALC", "TRIG", "CHOLHDL", "LDLDIRECT" in the last 72 hours. Thyroid Function Tests: No results for input(s): "TSH", "T4TOTAL", "FREET4", "T3FREE", "THYROIDAB" in the last 72 hours. Anemia Panel: No results for input(s): "VITAMINB12", "FOLATE", "FERRITIN", "TIBC", "IRON", "RETICCTPCT" in the last 72 hours. Urine analysis:    Component Value Date/Time   COLORURINE YELLOW (A) 04/16/2023 1636   APPEARANCEUR TURBID (A) 04/16/2023 1636   APPEARANCEUR Clear 09/17/2018 1028   LABSPEC 1.008 04/16/2023 1636    LABSPEC 1.013 06/20/2012 1508   PHURINE 8.0 04/16/2023 1636   GLUCOSEU NEGATIVE 04/16/2023 1636   GLUCOSEU Negative 06/20/2012 1508   HGBUR MODERATE (A) 04/16/2023 1636   BILIRUBINUR NEGATIVE 04/16/2023 1636   BILIRUBINUR Negative 09/17/2018 1028   BILIRUBINUR Negative 06/20/2012 1508   KETONESUR NEGATIVE 04/16/2023 1636   PROTEINUR 100 (A) 04/16/2023 1636   NITRITE NEGATIVE 04/16/2023 1636   LEUKOCYTESUR LARGE (A) 04/16/2023 1636   LEUKOCYTESUR Negative 06/20/2012 1508   Sepsis Labs: @LABRCNTIP (procalcitonin:4,lacticidven:4) ) Recent Results (from the past 240 hour(s))  SARS Coronavirus 2 by RT PCR (hospital order, performed in [facility] hospital lab) *cepheid single result test* Anterior Nasal Swab     Status: None   Collection Time: 04/16/23  2:10 PM   Specimen: Anterior Nasal Swab  Result Value Ref Range Status   SARS Coronavirus 2 by RT PCR NEGATIVE NEGATIVE Final    Comment: (NOTE) SARS-CoV-2 target nucleic acids are NOT DETECTED.  The SARS-CoV-2 RNA is generally detectable in upper and lower respiratory specimens during the acute phase of infection. The lowest concentration of SARS-CoV-2 viral copies this assay can detect is 250 copies / mL. A negative result does not preclude SARS-CoV-2 infection and should not be used as the sole basis for treatment or other patient management decisions.  A negative result may occur with improper specimen collection / handling, submission of specimen other than nasopharyngeal swab, presence of viral mutation(s) within the areas targeted by this assay, and inadequate number of viral copies (<250 copies / mL). A negative result must be combined with clinical observations, patient history, and epidemiological information.  Fact Sheet for Patients:   RoadLapTop.co.za  Fact Sheet for Healthcare Providers: http://kim-miller.com/  This test is not yet approved or  cleared by the [place]  FDA and has been authorized for detection and/or diagnosis of SARS-CoV-2 by FDA under an Emergency Use Authorization (EUA).  This EUA will remain in effect (meaning this test can be used) for the duration of the COVID-19 declaration under Section 564(b)(1) of the Act, 21 U.S.C. section 360bbb-3(b)(1), unless the authorization is terminated or revoked sooner.  Performed at [facility], [address] [place], [place] [address]      Radiological Exams on Admission: US RENAL  Result Date: 04/16/2023 CLINICAL DATA:  Acute kidney injury EXAM: RENAL / URINARY TRACT ULTRASOUND COMPLETE COMPARISON:  None Available. FINDINGS: Right Kidney: Renal measurements: 10.2 x 5.6 x 5.2 cm = volume: 156 mL. Echogenicity within normal limits. No mass or hydronephrosis visualized. Benign simple cyst requiring no further follow-up or characterization. Left Kidney: Renal measurements: 11.1 x 5.4 x 4.1 cm = volume: 127 mL. Echogenicity within normal limits. No mass or hydronephrosis visualized. Bladder: Appears normal for degree of bladder distention. Other: None. IMPRESSION: No hydronephrosis. Electronically Signed   By: Jearld Lesch M.D.   On: 04/16/2023 16:02   DG Chest Port 1 View  Result Date: 04/16/2023 CLINICAL DATA:  Provided history: Cough. EXAM: PORTABLE CHEST 1 VIEW COMPARISON:  Prior chest radiograph 03/25/2023 and earlier. FINDINGS: Cardiomegaly. Small ill-defined opacity within the lateral left lung base. No appreciable airspace consolidation on the right. No evidence of pleural effusion or pneumothorax. No acute osseous abnormality identified. Degenerative changes of the spine. IMPRESSION: IMPRESSION 1. Ill-defined opacity within the lateral left lung base, which may reflect atelectasis or pneumonia. 2. Cardiomegaly. 3. Aortic Atherosclerosis (ICD10-I70.0). Electronically Signed   By: Jackey Loge D.O.   On: 04/16/2023 14:37      Assessment/Plan Principal Problem:   Electrolyte  abnormality Active Problems:   Hypercalcemia   Hypermagnesemia   Acute renal failure superimposed on stage 4 chronic kidney disease (HCC)   COPD (chronic obstructive pulmonary disease) (HCC)   Chronic diastolic CHF (congestive heart failure) (HCC)   HLD (hyperlipidemia)   Essential hypertension   OSA on CPAP   Assessment and Plan:   Electrolyte abnormality, hypercalcemia and hypermagnesemia: Calcium of 14.5, magnesium 3.9, potassium 4.0.  Likely due to overcorrection.  Mental status normal.  -Placed on telemetry bed for position -IV fluid: 1 L normal saline was given in ED -Hold calcium and vitamin D supplement -Hold magnesium oxide supplement -Calcitonin 296 unit bid x 2 doses  Acute renal failure superimposed on stage 4 chronic kidney disease (HCC): Renal ultrasound is negative for hydronephrosis.  Urinalysis negative few bacteria. -1 L normal saline -Hold torsemide spironolactone -Consulted Dr. Wynelle Link of renal  COPD (chronic obstructive pulmonary disease) (HCC): patient has cough with a lot of clear mucus production, denies chest pain has mild shortness of breath. CXR showed Ill-defined opacity within the lateral left lung base.  Patient does not have fever or leukocytosis, clinically does not seem to have pneumonia. -Will start Z-Pak empirically -Sputum culture -Bronchodilators  Chronic diastolic CHF (congestive heart failure) (HCC): 2D echo 06/19/2022 showed EF of 60 to 65%.  Patient has trace leg edema, no pulmonary on chest x-ray, clinically does not seem to have CHF exacerbation -Hold torsemide and spironolactone due to worsening renal function -Check BNP  HLD (hyperlipidemia) -Crestor  Essential hypertension -IV hydralazine as needed -Amlodipine, hydralazine, metoprolol  OSA - on CPAP  Addendum: RN reports that pt went into afib, with HR 110s-120s. Possibly due to electrolytes disturbance. -consulted Dr. Rutherford Nail of card -prn metoprolol for heart rate>  125  DVT ppx:  SQ Lovenox  Code Status: Full code per pt  Family Communication: I called her daughter who did not pick up the phone, I left a message to her  Disposition Plan:  Anticipate discharge back to previous environment  Consults called:  Dr. Wynelle Link of renal  Admission status and Level of care: Telemetry Medical:  for obs    Dispo: The patient is from: Home              Anticipated d/c is to: Home              Anticipated d/c date is: 1 day              Patient currently is not medically stable to d/c.    Severity of Illness:  The appropriate patient status for this patient is OBSERVATION. Observation status is judged to be reasonable and necessary in order to provide the required intensity of service to ensure the patient's safety. The patient's presenting symptoms, physical exam findings, and initial radiographic and laboratory data in the context of their medical condition is felt to place them at decreased risk for further clinical deterioration. Furthermore, it is anticipated that the patient will be medically stable for discharge from the hospital within 2 midnights of admission.        Date of Service 04/16/2023    Lorretta Harp [facility]   If 7PM-7AM, please contact night-coverage [email or web address] 04/16/2023, 5:25 PM

## 2023-04-16 NOTE — ED Triage Notes (Addendum)
Pt arrives from the [facility] Urgent care. Per UC, pt has been having weakness with a concerning UTI as well as a cough. They swabbed her for COVID, Flu and RSV and pt was negative. Pt also sts that she has a dialysis shunt in her left arm but she has not started HD yet.

## 2023-04-16 NOTE — Plan of Care (Signed)
   Problem: Clinical Measurements: Goal: Ability to maintain clinical measurements within normal limits will improve Outcome: Progressing   

## 2023-04-17 DIAGNOSIS — R54 Age-related physical debility: Secondary | ICD-10-CM | POA: Diagnosis present

## 2023-04-17 DIAGNOSIS — D631 Anemia in chronic kidney disease: Secondary | ICD-10-CM | POA: Diagnosis present

## 2023-04-17 DIAGNOSIS — J9811 Atelectasis: Secondary | ICD-10-CM | POA: Diagnosis present

## 2023-04-17 DIAGNOSIS — R8271 Bacteriuria: Secondary | ICD-10-CM | POA: Diagnosis present

## 2023-04-17 DIAGNOSIS — J4489 Other specified chronic obstructive pulmonary disease: Secondary | ICD-10-CM | POA: Diagnosis present

## 2023-04-17 DIAGNOSIS — J189 Pneumonia, unspecified organism: Secondary | ICD-10-CM | POA: Diagnosis present

## 2023-04-17 DIAGNOSIS — E878 Other disorders of electrolyte and fluid balance, not elsewhere classified: Secondary | ICD-10-CM

## 2023-04-17 DIAGNOSIS — F32A Depression, unspecified: Secondary | ICD-10-CM | POA: Diagnosis present

## 2023-04-17 DIAGNOSIS — K219 Gastro-esophageal reflux disease without esophagitis: Secondary | ICD-10-CM | POA: Diagnosis present

## 2023-04-17 DIAGNOSIS — K7581 Nonalcoholic steatohepatitis (NASH): Secondary | ICD-10-CM | POA: Diagnosis present

## 2023-04-17 DIAGNOSIS — N179 Acute kidney failure, unspecified: Secondary | ICD-10-CM | POA: Diagnosis present

## 2023-04-17 DIAGNOSIS — N185 Chronic kidney disease, stage 5: Secondary | ICD-10-CM | POA: Diagnosis present

## 2023-04-17 DIAGNOSIS — E890 Postprocedural hypothyroidism: Secondary | ICD-10-CM | POA: Diagnosis present

## 2023-04-17 DIAGNOSIS — E785 Hyperlipidemia, unspecified: Secondary | ICD-10-CM | POA: Diagnosis present

## 2023-04-17 DIAGNOSIS — R531 Weakness: Secondary | ICD-10-CM | POA: Diagnosis present

## 2023-04-17 DIAGNOSIS — N2581 Secondary hyperparathyroidism of renal origin: Secondary | ICD-10-CM | POA: Diagnosis present

## 2023-04-17 DIAGNOSIS — I4891 Unspecified atrial fibrillation: Secondary | ICD-10-CM | POA: Diagnosis not present

## 2023-04-17 DIAGNOSIS — Z8616 Personal history of COVID-19: Secondary | ICD-10-CM | POA: Diagnosis not present

## 2023-04-17 DIAGNOSIS — I5032 Chronic diastolic (congestive) heart failure: Secondary | ICD-10-CM | POA: Diagnosis present

## 2023-04-17 DIAGNOSIS — Z1152 Encounter for screening for COVID-19: Secondary | ICD-10-CM | POA: Diagnosis not present

## 2023-04-17 DIAGNOSIS — E669 Obesity, unspecified: Secondary | ICD-10-CM | POA: Diagnosis present

## 2023-04-17 DIAGNOSIS — E1122 Type 2 diabetes mellitus with diabetic chronic kidney disease: Secondary | ICD-10-CM | POA: Diagnosis present

## 2023-04-17 DIAGNOSIS — M810 Age-related osteoporosis without current pathological fracture: Secondary | ICD-10-CM | POA: Diagnosis present

## 2023-04-17 DIAGNOSIS — I132 Hypertensive heart and chronic kidney disease with heart failure and with stage 5 chronic kidney disease, or end stage renal disease: Secondary | ICD-10-CM | POA: Diagnosis present

## 2023-04-17 LAB — CBC
HCT: 29.7 % — ABNORMAL LOW (ref 36.0–46.0)
Hemoglobin: 9.5 g/dL — ABNORMAL LOW (ref 12.0–15.0)
MCH: 28.3 pg (ref 26.0–34.0)
MCHC: 32 g/dL (ref 30.0–36.0)
MCV: 88.4 fL (ref 80.0–100.0)
Platelets: 194 10*3/uL (ref 150–400)
RBC: 3.36 MIL/uL — ABNORMAL LOW (ref 3.87–5.11)
RDW: 13.7 % (ref 11.5–15.5)
WBC: 7.6 10*3/uL (ref 4.0–10.5)
nRBC: 0 % (ref 0.0–0.2)

## 2023-04-17 LAB — MAGNESIUM: Magnesium: 3.6 mg/dL — ABNORMAL HIGH (ref 1.7–2.4)

## 2023-04-17 LAB — BASIC METABOLIC PANEL
Anion gap: 9 (ref 5–15)
BUN: 75 mg/dL — ABNORMAL HIGH (ref 8–23)
CO2: 27 mmol/L (ref 22–32)
Calcium: 12.9 mg/dL — ABNORMAL HIGH (ref 8.9–10.3)
Chloride: 102 mmol/L (ref 98–111)
Creatinine, Ser: 5.73 mg/dL — ABNORMAL HIGH (ref 0.44–1.00)
GFR, Estimated: 7 mL/min — ABNORMAL LOW (ref >60)
Glucose, Bld: 111 mg/dL — ABNORMAL HIGH (ref 70–99)
Potassium: 4.2 mmol/L (ref 3.5–5.1)
Sodium: 138 mmol/L (ref 135–145)

## 2023-04-17 MED ORDER — ALBUTEROL SULFATE (2.5 MG/3ML) 0.083% IN NEBU
2.5000 mg | INHALATION_SOLUTION | RESPIRATORY_TRACT | Status: DC | PRN
  Administered 2023-04-18 – 2023-04-21 (×2): 2.5 mg via RESPIRATORY_TRACT
  Filled 2023-04-17 (×3): qty 3

## 2023-04-17 MED ORDER — SODIUM CHLORIDE 0.9 % IV SOLN: INTRAVENOUS | Status: AC

## 2023-04-17 MED ORDER — IPRATROPIUM-ALBUTEROL 0.5-2.5 (3) MG/3ML IN SOLN
3.0000 mL | Freq: Three times a day (TID) | RESPIRATORY_TRACT | Status: DC
  Administered 2023-04-17 – 2023-04-19 (×5): 3 mL via RESPIRATORY_TRACT
  Filled 2023-04-17 (×5): qty 3

## 2023-04-17 NOTE — Progress Notes (Signed)
Occupational Therapy Evaluation Patient Details Name: [name] MRN: [id number] DOB: 1941-05-10 Today's Date: 04/17/2023   History of Present Illness [name] is a 82 y.o. female with medical history significant of CKD-4, HTN, HLD, COPD, dCHF, hypothyroidism, OSA on CPAP, depression, who presents with weakness.   Patient was recently hospitalized from 6/21 - 6/25 due to hypocalcemia and hypomagnesemia, which were repleted and normalized prior to discharge.   Clinical Impression   Patient presenting with generalized weakness, limited tolerance to activity due to SOB/coughing upon exertion which impacts safe, efficient ADL performance. Patient lives with spouse (who has dementia), MOD I with ADL and mobility using RW, currently drives and has niece who checks in on her daily. Patient currently functioning below baseline with problems noted in flowsheet. SpO2% 96 on 1L via [place], desats to 80% with stand pivot to BSC, PLB cued and increases to 89-90%. Overall, minA for transfers with RW, anticipate minA for LB ADLs and min guard for UB. Pt fatigues quickly, requires frequent rest breaks and presents with tremors that she reports are her baseline. Short Blessed Test indicates impaired cognition with a score of 11/28 (pt frequently shaking head and saying "I can't remember" when reciting months of year). Patient will benefit from acute OT to increase overall independence in the areas of ADLs, functional mobility, and energy conservation in order to safely discharge.       Recommendations for follow up therapy are one component of a multi-disciplinary discharge planning process, led by the attending physician.  Recommendations may be updated based on patient status, additional functional criteria and insurance authorization.   Assistance Recommended at Discharge Intermittent Supervision/Assistance  Patient can return home with the following A little help with walking and/or transfers;A little help  with bathing/dressing/bathroom;Assistance with cooking/housework;Direct supervision/assist for medications management;Direct supervision/assist for financial management;Help with stairs or ramp for entrance;Assist for transportation    Functional Status Assessment  Patient has had a recent decline in their functional status and demonstrates the ability to make significant improvements in function in a reasonable and predictable amount of time.  Equipment Recommendations  Other (comment) (Defer to next LOC)       Precautions / Restrictions Precautions Precautions: None      Mobility Bed Mobility Overal bed mobility: Needs Assistance Bed Mobility: Sit to Sidelying         Sit to sidelying: Min assist, HOB elevated General bed mobility comments: Assist for BLE mgmt back to bed    Transfers Overall transfer level: Needs assistance Equipment used: Rolling walker (2 wheels) Transfers: Bed to chair/wheelchair/BSC, Sit to/from Stand Sit to Stand: Min assist Stand pivot transfers: Min assist         General transfer comment: Fatigues quickly, coughing and SOB with mobility      Balance Overall balance assessment: Needs assistance Sitting-balance support: Feet supported Sitting balance-Leahy Scale: Good     Standing balance support: Bilateral upper extremity supported, During functional activity, Reliant on assistive device for balance Standing balance-Leahy Scale: Fair                             ADL either performed or assessed with clinical judgement   ADL Overall ADL's : Needs assistance/impaired Eating/Feeding: Set up                       Toilet Transfer: Minimal assistance;Cueing for safety;Stand-pivot;BSC/3in1;Rolling walker (2 wheels)  Functional mobility during ADLs: Minimal assistance;Rolling walker (2 wheels) General ADL Comments: EOB <> BSC transfer with MINA, SOB upon activity, fatigues quickly. Pt would require minA for  LB ADLs and minguard for UB ADLs      Pertinent Vitals/Pain Pain Assessment Pain Assessment: No/denies pain     Hand Dominance     Extremity/Trunk Assessment Upper Extremity Assessment Upper Extremity Assessment: Generalized weakness (BUE tremors)   Lower Extremity Assessment Lower Extremity Assessment: Generalized weakness       Communication Communication Communication: HOH   Cognition Arousal/Alertness: Lethargic Behavior During Therapy: WFL for tasks assessed/performed Overall Cognitive Status: No family/caregiver present to determine baseline cognitive functioning                                 General Comments: Alert to name, situation, DOB and location. Date given May 03, 1982.     General Comments  SpO2% dropping to 80% on 1L via [place], PLB cued, returns to 89-90%. Would not stay above 90% on 1L, pt continues to endorse SOB, increased to 2L and sats 96%. Pt c/o nausea end of eval. RN Alex notified,    Exercises Exercises: Other exercises Other Exercises Other Exercises: Short Blessed Test performed, pt scoring 11/28   Shoulder Instructions      Home Living Family/patient expects to be discharged to:: Private residence Living Arrangements: Spouse/significant other;Other relatives (husband has dementia but still drives, niece calls daily to check in) Available Help at Discharge: Family Type of Home: House Home Access: Stairs to enter Secretary/administrator of Steps: 1 Entrance Stairs-Rails: None Home Layout: Two level;Bed/bath upstairs     Bathroom Shower/Tub: Chief Strategy Officer: Standard     Home Equipment: Agricultural consultant (2 wheels);BSC/3in1;Tub bench;Grab bars - tub/shower;Wheelchair - manual          Prior Functioning/Environment Prior Level of Function : Independent/Modified Independent;Driving             Mobility Comments: uses RW (says HH PT working on balance) ADLs Comments: pt reports she drives, manages  meds and preps meals. assists husband who has dementia        OT Problem List: Decreased strength;Decreased activity tolerance;Impaired balance (sitting and/or standing);Cardiopulmonary status limiting activity      OT Treatment/Interventions: Self-care/ADL training;Neuromuscular education;Energy conservation;Therapeutic exercise;Patient/family education;Balance training;DME and/or AE instruction;Therapeutic activities    OT Goals(Current goals can be found in the care plan section) Acute Rehab OT Goals OT Goal Formulation: With patient Time For Goal Achievement: 05/01/23 Potential to Achieve Goals: Fair  OT Frequency: Min 1X/week       AM-PAC OT "6 Clicks" Daily Activity     Outcome Measure Help from another person eating meals?: None Help from another person taking care of personal grooming?: A Little Help from another person toileting, which includes using toliet, bedpan, or urinal?: A Little Help from another person bathing (including washing, rinsing, drying)?: A Little Help from another person to put on and taking off regular upper body clothing?: A Little Help from another person to put on and taking off regular lower body clothing?: A Little 6 Click Score: 19   End of Session Equipment Utilized During Treatment: Oxygen;Gait belt;Rolling walker (2 wheels) (Desats with activity) Nurse Communication: Other (comment);Mobility status (RN notified of pt's c/o nausea and increased O2 2L via [place])  Activity Tolerance: Patient limited by fatigue Patient left: in bed;with call bell/phone within reach;with bed alarm set  OT Visit Diagnosis: Unsteadiness on feet (R26.81);Muscle weakness (generalized) (M62.81);Other abnormalities of gait and mobility (R26.89)                Time: 1000-1023 OT Time Calculation (min): 23 min Charges:  OT General Charges $OT Visit: 1 Visit OT Evaluation $OT Eval Low Complexity: 1 Low OT Treatments $Self Care/Home Management : 8-22 mins  Evalise Abruzzese L.  Takila Kronberg, OTR/L  04/17/23, 12:32 PM

## 2023-04-17 NOTE — Progress Notes (Signed)
[facility]  ROUNDING NOTE   Subjective:   [name] was admitted to [facility] on 04/16/2023 for Hypercalcemia [E83.52] Electrolyte abnormality [E87.8]  Patient is known to our practice from previous admissions and is followed outpatient by Dr Cherylann Ratel. Patient was seen at the [facility] urgent care for UTI. Complains of fatigue. Denies fever or chills. Does report poor oral intake over the previous couple days. Labs indicate elevated calcium 14.5.   Labs on ED arrival sodium 133, BUN 78, creatinine 6.17 with GFR 6. Hgb 10.6.UA appears turbid with leukocytes, protein and bacteria. Chest xray shows an ill defined opacity in the basilar left lung. Renal US negative for obstruction.   Calcium 12.9 (14.5)  Objective:  Vital signs in last 24 hours:  Temp:  [97.9 F (36.6 C)-99.3 F (37.4 C)] 98.4 F (36.9 C) (07/16 0751) Pulse Rate:  [58-128] 59 (07/16 0751) Resp:  [12-23] 18 (07/16 0751) BP: (126-198)/(43-81) 126/81 (07/16 0751) SpO2:  [88 %-100 %] 96 % (07/16 0801) Weight:  [73.9 kg-77.4 kg] 77.4 kg (07/16 0500)  Weight change:  Filed Weights   04/16/23 1232 04/17/23 0500  Weight: 73.9 kg 77.4 kg    Intake/Output: I/O last 3 completed shifts: In: 1200 [P.O.:200; IV Piggyback:1000] Out: -    Intake/Output this shift:  No intake/output data recorded.  Physical Exam: General: NAD, laying in bed  Head: Normocephalic, atraumatic. Moist oral mucosal membranes  Eyes: Anicteric  Lungs:  Diminished at bases, cough  Heart: Regular rate and rhythm  Abdomen:  Soft, nontender  Extremities:  no peripheral edema.  Neurologic: Nonfocal, moving all four extremities  Skin: No lesions  Access: Left AVF    Basic Metabolic Panel: Recent Labs  Lab 04/16/23 1235 04/17/23 0433  NA 133* 138  K 4.0 4.2  CL 96* 102  CO2 25 27  GLUCOSE 111* 111*  BUN 78* 75*  CREATININE 6.17* 5.73*  CALCIUM 14.5* 12.9*  MG 3.9* 3.6*  PHOS 5.6*  --     Liver Function Tests: Recent Labs   Lab 04/16/23 1235  AST 24  ALT 20  ALKPHOS 58  BILITOT 0.8  PROT 8.0  ALBUMIN 4.1   No results for input(s): "LIPASE", "AMYLASE" in the last 168 hours. No results for input(s): "AMMONIA" in the last 168 hours.  CBC: Recent Labs  Lab 04/16/23 1235 04/17/23 0433  WBC 7.2 7.6  HGB 10.6* 9.5*  HCT 33.2* 29.7*  MCV 89.7 88.4  PLT 198 194    Cardiac Enzymes: No results for input(s): "CKTOTAL", "CKMB", "CKMBINDEX", "TROPONINI" in the last 168 hours.  BNP: Invalid input(s): "POCBNP"  CBG: No results for input(s): "GLUCAP" in the last 168 hours.  Microbiology: Results for orders placed or performed during the hospital encounter of 04/16/23  SARS Coronavirus 2 by RT PCR (hospital order, performed in [facility] hospital lab) *cepheid single result test* Anterior Nasal Swab     Status: None   Collection Time: 04/16/23  2:10 PM   Specimen: Anterior Nasal Swab  Result Value Ref Range Status   SARS Coronavirus 2 by RT PCR NEGATIVE NEGATIVE Final    Comment: (NOTE) SARS-CoV-2 target nucleic acids are NOT DETECTED.  The SARS-CoV-2 RNA is generally detectable in upper and lower respiratory specimens during the acute phase of infection. The lowest concentration of SARS-CoV-2 viral copies this assay can detect is 250 copies / mL. A negative result does not preclude SARS-CoV-2 infection and should not be used as the sole basis for treatment  or other patient management decisions.  A negative result may occur with improper specimen collection / handling, submission of specimen other than nasopharyngeal swab, presence of viral mutation(s) within the areas targeted by this assay, and inadequate number of viral copies (<250 copies / mL). A negative result must be combined with clinical observations, patient history, and epidemiological information.  Fact Sheet for Patients:   RoadLapTop.co.za  Fact Sheet for Healthcare  Providers: http://kim-miller.com/  This test is not yet approved or  cleared by the [place] FDA and has been authorized for detection and/or diagnosis of SARS-CoV-2 by FDA under an Emergency Use Authorization (EUA).  This EUA will remain in effect (meaning this test can be used) for the duration of the COVID-19 declaration under Section 564(b)(1) of the Act, 21 U.S.C. section 360bbb-3(b)(1), unless the authorization is terminated or revoked sooner.  Performed at [facility], [address] [place], [place] [address]     Coagulation Studies: No results for input(s): "LABPROT", "INR" in the last 72 hours.  Urinalysis: Recent Labs    04/16/23 1636  COLORURINE YELLOW*  LABSPEC 1.008  PHURINE 8.0  GLUCOSEU NEGATIVE  HGBUR MODERATE*  BILIRUBINUR NEGATIVE  KETONESUR NEGATIVE  PROTEINUR 100*  NITRITE NEGATIVE  LEUKOCYTESUR LARGE*      Imaging: US RENAL  Result Date: 04/16/2023 CLINICAL DATA:  Acute kidney injury EXAM: RENAL / URINARY TRACT ULTRASOUND COMPLETE COMPARISON:  None Available. FINDINGS: Right Kidney: Renal measurements: 10.2 x 5.6 x 5.2 cm = volume: 156 mL. Echogenicity within normal limits. No mass or hydronephrosis visualized. Benign simple cyst requiring no further follow-up or characterization. Left Kidney: Renal measurements: 11.1 x 5.4 x 4.1 cm = volume: 127 mL. Echogenicity within normal limits. No mass or hydronephrosis visualized. Bladder: Appears normal for degree of bladder distention. Other: None. IMPRESSION: No hydronephrosis. Electronically Signed   By: Jearld Lesch M.D.   On: 04/16/2023 16:02   DG Chest Port 1 View  Result Date: 04/16/2023 CLINICAL DATA:  Provided history: Cough. EXAM: PORTABLE CHEST 1 VIEW COMPARISON:  Prior chest radiograph 03/25/2023 and earlier. FINDINGS: Cardiomegaly. Small ill-defined opacity within the lateral left lung base. No appreciable airspace consolidation on the right. No evidence of  pleural effusion or pneumothorax. No acute osseous abnormality identified. Degenerative changes of the spine. IMPRESSION: IMPRESSION 1. Ill-defined opacity within the lateral left lung base, which may reflect atelectasis or pneumonia. 2. Cardiomegaly. 3. Aortic Atherosclerosis (ICD10-I70.0). Electronically Signed   By: Jackey Loge D.O.   On: 04/16/2023 14:37     Medications:      amLODipine  5 mg Oral Daily   azelastine  1 spray Each Nare BID   azithromycin  250 mg Oral Daily   feeding supplement  237 mL Oral BID BM   heparin  5,000 Units Subcutaneous Q8H   hydrALAZINE  100 mg Oral BID   ipratropium-albuterol  3 mL Nebulization Q6H   levothyroxine  150 mcg Oral Daily   metoprolol tartrate  50 mg Oral BID   mometasone-formoterol  2 puff Inhalation BID   montelukast  10 mg Oral QHS   pantoprazole  40 mg Oral Daily   rosuvastatin  40 mg Oral Daily   sertraline  100 mg Oral Daily   acetaminophen, albuterol, fluticasone, guaiFENesin, hydrALAZINE, loperamide, metoprolol tartrate, ondansetron (ZOFRAN) IV  Assessment/ Plan:  Ms. ELSI STELZER is a 82 y.o.  female with diabetes mellitus type II, hypertension, COPD, hypothyroidism, allergies, chronic congestive heart failure, GERD, sleep apnea, hyperlipidemia who is  admitted to [facility] on 04/16/2023 for Hypercalcemia [E83.52] Electrolyte abnormality [E87.8]  Acute kidney injury on chronic kidney disease stage IV: baseline creatinine of 1.92, GFR of 26 on 03/27/23. Renal US negative for obstruction. No IV contrast exposure. Acute kidney injury likely secondary to infection, UTI, and poor oral intake. Patient received NS 1L bolus. Would consider IV hydration. No acute indication for dialysis.    Hypercalcemia:  PTH of 17 on 04/02/23.  - Calcium 14.5 on admission.  - Corrected calcium 14.4 - Holding all calcium supplements   Hypertension with chronic kidney disease: home regimen of spironolactone, metoprolol, hydralazine, amlodipine, torsemide.  -  Holding torsemide . - Blood pressure stable  Anemia of chronic kidney disease: hemoglobin 9.5. Normocytic.   Diabetes mellitus type II with chronic kidney disease: well controlled with hemoglobin A1c of 5.9% on 01/22/23. Diet controlled   LOS: 0 Tallon Gertz 7/16/202410:57 AM

## 2023-04-17 NOTE — Progress Notes (Addendum)
PT Cancellation Note  Patient Details Name: [name] MRN: [id number] DOB: 1941/08/07   Cancelled Treatment:    Reason Eval/Treat Not Completed: Fatigue/lethargy limiting ability to participate Pt in bed sleeping, awoke to authors voice. Tried multiple times to initiate session with patient, continued refusal for all activities stating " I'm too tired, I'm too weak". Patient tried falling asleep while talking to Chartered loss adjuster. Pt left in bed and with alarm set, notified nursing of patients lethargy. Per discussion with RN with re attempt later.   Re attempted patient at 2 pm, patient still sleeping and awoke to authors voice. Encouraged patient to ambulate with PT and she refused stating she was too tired. Educated on the benefits of getting up and patient requested we try tomorrow after breakfast. Chartered loss adjuster set patient up with lunch and will re attempt tomorrow.   Malachi Carl, SPT   Malachi Carl 04/17/2023, 11:46 AM

## 2023-04-17 NOTE — Progress Notes (Signed)
PROGRESS NOTE    [name]  WUJ:[id number] DOB: 1941/07/19 DOA: 04/16/2023 PCP: Lauro Regulus, MD    Brief Narrative:  82 y.o. female with medical history significant of CKD-4, HTN, HLD, COPD, dCHF, hypothyroidism, OSA on CPAP, depression, who presents with weakness.    Patient was recently hospitalized from 6/21 - 6/25 due to hypocalcemia and hypomagnesemia, which were repleted and normalized prior to discharge. Pt was started on Vitamin D and Calcitriol, and magnesium oxide.  Patient states that she has generalized weakness for almost a week, which which has been progressively worsening.  No unilateral numbness or tinglings in extremities.  No facial droop or slurred speech.  Patient also reports cough with mild shortness of breath, denies chest pain, fever or chills.  She coughs up a lot of clear mucus.  She was seen in urgent care, and had negative PCR for COVID, flu and RSV.  Patient reports nausea, no vomiting, diarrhea or abdominal pain.  7/16: New onset A-fib RVR noted with heart rate 1 10-1 20s on 7/15.  Converted spontaneously after correction of electrolytes.  Assessment & Plan:   Principal Problem:   Electrolyte abnormality Active Problems:   Hypercalcemia   Hypermagnesemia   Acute renal failure superimposed on stage 4 chronic kidney disease (HCC)   COPD (chronic obstructive pulmonary disease) (HCC)   Chronic diastolic CHF (congestive heart failure) (HCC)   HLD (hyperlipidemia)   Essential hypertension   OSA on CPAP  Electrolyte abnormality, hypercalcemia and hypermagnesemia Calcium of 14.5, magnesium 3.9, potassium 4.0.  Likely due to overcorrection.  Mental status normal.  Electrolytes have improved Status post 2 doses of calcitonin Plan: Nephrology on consult Hold calcium and vitamin D supplement Hold magnesium oxide   Acute renal failure superimposed on stage 4 chronic kidney disease (HCC): Renal ultrasound is negative for hydronephrosis.   Urinalysis  negative few bacteria. Baseline creatinine 2.4.  Currently 5.7 Plan: Holding diuretics until evaluated by nephrology  COPD (chronic obstructive pulmonary disease) (HCC) patient has cough with a lot of clear mucus production, denies chest pain has mild shortness of breath. CXR showed Ill-defined opacity within the lateral left lung base.  Patient does not have fever or leukocytosis, clinically does not seem to have pneumonia. Plan: Bronchodilator empiric azithromycin   Chronic diastolic CHF (congestive heart failure) (HCC): 2D echo 06/19/2022 showed EF of 60 to 65%.  Patient has trace leg edema, no pulmonary on chest x-ray, clinically does not seem to have CHF exacerbation Plan: Diuretics on hold BNP 273.  Not necessarily indicative of decompensated heart failure   HLD (hyperlipidemia) PTA rosuvastatin   Essential hypertension -IV hydralazine as needed -Amlodipine, hydralazine, metoprolol   OSA - on CPAP nightly  New onset A-fib Noted on telemetry on 7/15.  Max ventricular rate 120.  Resolved spontaneously without intervention after correction of electrolytes.  Last echocardiogram from September 2023.  Normal ejection fraction with no diastolic dysfunction.  Will maintain on telemetry for now.  Defer cardiology consultation and anticoagulation.  If patient goes back into rapid atrial fibrillation consider cardiology evaluation.   DVT prophylaxis: SQ heparin Code Status: Full Family Communication: None today Disposition Plan: Status is: Inpatient Remains inpatient appropriate because: Severe AKI     Level of care: Telemetry Medical  Consultants:  Nephrology  Procedures:  None  Antimicrobials: Azithromycin   Subjective: Seen and examined.  Resting in bed.  No visible distress  Objective: Vitals:   04/17/23 0256 04/17/23 0500 04/17/23 0751 04/17/23 0801  BP: Marland Kitchen)  137/43  126/81   Pulse: (!) 58  (!) 59   Resp: 19  18   Temp: 99.3 F (37.4 C)  98.4 F (36.9 C)    TempSrc: Oral  Oral   SpO2: 96%  94% 96%  Weight:  77.4 kg    Height:        Intake/Output Summary (Last 24 hours) at 04/17/2023 1116 Last data filed at 04/16/2023 2200 Gross per 24 hour  Intake 1200 ml  Output --  Net 1200 ml   Filed Weights   04/16/23 1232 04/17/23 0500  Weight: 73.9 kg 77.4 kg    Examination:  General exam: NAD.  Appears frail Respiratory system: Clear to auscultation. Respiratory effort normal. Cardiovascular system: S1-S2, RRR, no murmurs, trace pedal edema Gastrointestinal system: Soft, NT/ND, normal bowel sounds Central nervous system: Alert and oriented. No focal neurological deficits. Extremities: Symmetrical deep power bilaterally Skin: No rashes, lesions or ulcers Psychiatry: Judgement and insight appear normal. Mood & affect appropriate.     Data Reviewed: I have personally reviewed following labs and imaging studies  CBC: Recent Labs  Lab 04/16/23 1235 04/17/23 0433  WBC 7.2 7.6  HGB 10.6* 9.5*  HCT 33.2* 29.7*  MCV 89.7 88.4  PLT 198 194   Basic Metabolic Panel: Recent Labs  Lab 04/16/23 1235 04/17/23 0433  NA 133* 138  K 4.0 4.2  CL 96* 102  CO2 25 27  GLUCOSE 111* 111*  BUN 78* 75*  CREATININE 6.17* 5.73*  CALCIUM 14.5* 12.9*  MG 3.9* 3.6*  PHOS 5.6*  --    GFR: Estimated Creatinine Clearance: 7.3 mL/min (A) (by C-G formula based on SCr of 5.73 mg/dL (H)). Liver Function Tests: Recent Labs  Lab 04/16/23 1235  AST 24  ALT 20  ALKPHOS 58  BILITOT 0.8  PROT 8.0  ALBUMIN 4.1   No results for input(s): "LIPASE", "AMYLASE" in the last 168 hours. No results for input(s): "AMMONIA" in the last 168 hours. Coagulation Profile: No results for input(s): "INR", "PROTIME" in the last 168 hours. Cardiac Enzymes: No results for input(s): "CKTOTAL", "CKMB", "CKMBINDEX", "TROPONINI" in the last 168 hours. BNP (last 3 results) No results for input(s): "PROBNP" in the last 8760 hours. HbA1C: No results for input(s):  "HGBA1C" in the last 72 hours. CBG: No results for input(s): "GLUCAP" in the last 168 hours. Lipid Profile: No results for input(s): "CHOL", "HDL", "LDLCALC", "TRIG", "CHOLHDL", "LDLDIRECT" in the last 72 hours. Thyroid Function Tests: No results for input(s): "TSH", "T4TOTAL", "FREET4", "T3FREE", "THYROIDAB" in the last 72 hours. Anemia Panel: No results for input(s): "VITAMINB12", "FOLATE", "FERRITIN", "TIBC", "IRON", "RETICCTPCT" in the last 72 hours. Sepsis Labs: No results for input(s): "PROCALCITON", "LATICACIDVEN" in the last 168 hours.  Recent Results (from the past 240 hour(s))  SARS Coronavirus 2 by RT PCR (hospital order, performed in [facility] hospital lab) *cepheid single result test* Anterior Nasal Swab     Status: None   Collection Time: 04/16/23  2:10 PM   Specimen: Anterior Nasal Swab  Result Value Ref Range Status   SARS Coronavirus 2 by RT PCR NEGATIVE NEGATIVE Final    Comment: (NOTE) SARS-CoV-2 target nucleic acids are NOT DETECTED.  The SARS-CoV-2 RNA is generally detectable in upper and lower respiratory specimens during the acute phase of infection. The lowest concentration of SARS-CoV-2 viral copies this assay can detect is 250 copies / mL. A negative result does not preclude SARS-CoV-2 infection and should not be used as the sole basis for  treatment or other patient management decisions.  A negative result may occur with improper specimen collection / handling, submission of specimen other than nasopharyngeal swab, presence of viral mutation(s) within the areas targeted by this assay, and inadequate number of viral copies (<250 copies / mL). A negative result must be combined with clinical observations, patient history, and epidemiological information.  Fact Sheet for Patients:   RoadLapTop.co.za  Fact Sheet for Healthcare Providers: http://kim-miller.com/  This test is not yet approved or  cleared by the  [place] FDA and has been authorized for detection and/or diagnosis of SARS-CoV-2 by FDA under an Emergency Use Authorization (EUA).  This EUA will remain in effect (meaning this test can be used) for the duration of the COVID-19 declaration under Section 564(b)(1) of the Act, 21 U.S.C. section 360bbb-3(b)(1), unless the authorization is terminated or revoked sooner.  Performed at [facility], [address] [place], [place] [address]          Radiology Studies: US RENAL  Result Date: 04/16/2023 CLINICAL DATA:  Acute kidney injury EXAM: RENAL / URINARY TRACT ULTRASOUND COMPLETE COMPARISON:  None Available. FINDINGS: Right Kidney: Renal measurements: 10.2 x 5.6 x 5.2 cm = volume: 156 mL. Echogenicity within normal limits. No mass or hydronephrosis visualized. Benign simple cyst requiring no further follow-up or characterization. Left Kidney: Renal measurements: 11.1 x 5.4 x 4.1 cm = volume: 127 mL. Echogenicity within normal limits. No mass or hydronephrosis visualized. Bladder: Appears normal for degree of bladder distention. Other: None. IMPRESSION: No hydronephrosis. Electronically Signed   By: Jearld Lesch M.D.   On: 04/16/2023 16:02   DG Chest Port 1 View  Result Date: 04/16/2023 CLINICAL DATA:  Provided history: Cough. EXAM: PORTABLE CHEST 1 VIEW COMPARISON:  Prior chest radiograph 03/25/2023 and earlier. FINDINGS: Cardiomegaly. Small ill-defined opacity within the lateral left lung base. No appreciable airspace consolidation on the right. No evidence of pleural effusion or pneumothorax. No acute osseous abnormality identified. Degenerative changes of the spine. IMPRESSION: IMPRESSION 1. Ill-defined opacity within the lateral left lung base, which may reflect atelectasis or pneumonia. 2. Cardiomegaly. 3. Aortic Atherosclerosis (ICD10-I70.0). Electronically Signed   By: Jackey Loge D.O.   On: 04/16/2023 14:37        Scheduled Meds:  amLODipine  5 mg Oral Daily    azelastine  1 spray Each Nare BID   azithromycin  250 mg Oral Daily   feeding supplement  237 mL Oral BID BM   heparin  5,000 Units Subcutaneous Q8H   hydrALAZINE  100 mg Oral BID   ipratropium-albuterol  3 mL Nebulization Q6H   levothyroxine  150 mcg Oral Daily   metoprolol tartrate  50 mg Oral BID   mometasone-formoterol  2 puff Inhalation BID   montelukast  10 mg Oral QHS   pantoprazole  40 mg Oral Daily   rosuvastatin  40 mg Oral Daily   sertraline  100 mg Oral Daily   Continuous Infusions:   LOS: 0 days    Tresa Moore, MD [facility]   If 7PM-7AM, please contact night-coverage  04/17/2023, 11:16 AM

## 2023-04-18 ENCOUNTER — Inpatient Hospital Stay: Payer: Medicare Other

## 2023-04-18 DIAGNOSIS — E878 Other disorders of electrolyte and fluid balance, not elsewhere classified: Secondary | ICD-10-CM | POA: Diagnosis not present

## 2023-04-18 LAB — RESPIRATORY PANEL BY PCR

## 2023-04-18 LAB — URINALYSIS, COMPLETE (UACMP) WITH MICROSCOPIC
Bilirubin Urine: NEGATIVE
Glucose, UA: NEGATIVE mg/dL
Ketones, ur: NEGATIVE mg/dL
Nitrite: NEGATIVE
Protein, ur: 100 mg/dL — AB
Specific Gravity, Urine: 1.01 (ref 1.005–1.030)
WBC, UA: >50 WBC/hpf (ref 0–5)
pH: 7 (ref 5.0–8.0)

## 2023-04-18 LAB — BASIC METABOLIC PANEL
Anion gap: 10 (ref 5–15)
BUN: 75 mg/dL — ABNORMAL HIGH (ref 8–23)
CO2: 24 mmol/L (ref 22–32)
Calcium: 11.6 mg/dL — ABNORMAL HIGH (ref 8.9–10.3)
Chloride: 100 mmol/L (ref 98–111)
Creatinine, Ser: 5.82 mg/dL — ABNORMAL HIGH (ref 0.44–1.00)
GFR, Estimated: 7 mL/min — ABNORMAL LOW (ref >60)
Glucose, Bld: 137 mg/dL — ABNORMAL HIGH (ref 70–99)
Potassium: 3.4 mmol/L — ABNORMAL LOW (ref 3.5–5.1)
Sodium: 134 mmol/L — ABNORMAL LOW (ref 135–145)

## 2023-04-18 LAB — RENAL FUNCTION PANEL
Albumin: 3.3 g/dL — ABNORMAL LOW (ref 3.5–5.0)
Anion gap: 7 (ref 5–15)
BUN: 69 mg/dL — ABNORMAL HIGH (ref 8–23)
CO2: 24 mmol/L (ref 22–32)
Calcium: 11.4 mg/dL — ABNORMAL HIGH (ref 8.9–10.3)
Chloride: 103 mmol/L (ref 98–111)
Creatinine, Ser: 5.46 mg/dL — ABNORMAL HIGH (ref 0.44–1.00)
GFR, Estimated: 7 mL/min — ABNORMAL LOW (ref >60)
Glucose, Bld: 90 mg/dL (ref 70–99)
Phosphorus: 4.7 mg/dL — ABNORMAL HIGH (ref 2.5–4.6)
Potassium: 3.8 mmol/L (ref 3.5–5.1)
Sodium: 134 mmol/L — ABNORMAL LOW (ref 135–145)

## 2023-04-18 LAB — TSH: TSH: 1.325 u[IU]/mL (ref 0.350–4.500)

## 2023-04-18 LAB — D-DIMER, QUANTITATIVE: D-Dimer, Quant: 0.47 ug/mL-FEU (ref 0.00–0.50)

## 2023-04-18 MED ORDER — ADULT MULTIVITAMIN W/MINERALS CH
1.0000 | ORAL_TABLET | Freq: Every day | ORAL | Status: DC
  Administered 2023-04-19 – 2023-04-22 (×4): 1 via ORAL
  Filled 2023-04-18 (×4): qty 1

## 2023-04-18 MED ORDER — SODIUM CHLORIDE 0.9 % IV SOLN
1.0000 g | INTRAVENOUS | Status: DC
  Administered 2023-04-18 – 2023-04-21 (×4): 1 g via INTRAVENOUS
  Filled 2023-04-18 (×5): qty 10

## 2023-04-18 NOTE — Plan of Care (Signed)

## 2023-04-18 NOTE — Progress Notes (Signed)
Initial Nutrition Assessment  DOCUMENTATION CODES:   Not applicable  INTERVENTION:   Ensure Enlive po BID, each supplement provides 350 kcal and 20 grams of protein.  Magic cup TID with meals, each supplement provides 290 kcal and 9 grams of protein  MVI po daily   Liberalize diet   Pt at high refeed risk; recommend monitor potassium, magnesium and phosphorus labs daily until stable  Daily weights   NUTRITION DIAGNOSIS:   Inadequate oral intake related to acute illness as evidenced by per patient/family report.  GOAL:   Patient will meet greater than or equal to 90% of their needs  MONITOR:   PO intake, Supplement acceptance, Labs, Weight trends, I & O's, Skin  REASON FOR ASSESSMENT:   Malnutrition Screening Tool    ASSESSMENT:   82 y/o female with h/o HTN, CKD IV, OSA, CHF, HLD, COPD, GERD, renal mass, goiter s/p thyroidectomy, hypothyroidsm, depression and gout who is admitted with electrolyte abnormalties.  Met with pt in room today. Pt reports poor appetite and oral intake for several weeks pta. Pt reports that she has been eating fair in hospital; pt reports eating ~50% of meals today. Pt reports eating 50% of her oatmeal and 100% of her applesauce for breakfast this morning. Pt is unsure if she ate any lunch. Pt reports that she did not really like the vanilla Ensure; pt reports that she is willing to try strawberry flavor. Pt is also requesting a menu today. RD discussed with pt the importance of adequate nutrition needed to preserve lean muscle. Pt is willing to try Ensure and Magic Cups. RD will add supplements and MVI to help pt meet her estimated needs. RD will also liberalize pt's diet. Pt is at high refeed risk. Per chart, pt is down 24lbs(13%) over the past 10 months; this is not significant for the time frame.    Medications reviewed and include: azithromycin, synthroid, heparin, protonix, ceftriaxone   Labs reviewed: Na 134(L), K 3.4(L), BUN 75(H), creat  5.82(H) P 5.6(H)- 7/15 Mg 2.6(H)- 7/16 Hgb 9.5(L), Hct 29.7(L)  NUTRITION - FOCUSED PHYSICAL EXAM:  Flowsheet Row Most Recent Value  Orbital Region No depletion  Upper Arm Region No depletion  Thoracic and Lumbar Region No depletion  Buccal Region No depletion  Temple Region No depletion  Clavicle Bone Region No depletion  Clavicle and Acromion Bone Region No depletion  Scapular Bone Region No depletion  Dorsal Hand Mild depletion  Patellar Region Moderate depletion  Anterior Thigh Region Moderate depletion  Posterior Calf Region Moderate depletion  Edema (RD Assessment) None  Hair Reviewed  Eyes Reviewed  Mouth Reviewed  Skin Reviewed  Nails Reviewed   Diet Order:   Diet Order             Diet heart healthy/carb modified Fluid consistency: Thin  Diet effective now                  EDUCATION NEEDS:   Education needs have been addressed  Skin:  Skin Assessment: Reviewed RN Assessment  Last BM:  7/15  Height:   Ht Readings from Last 1 Encounters:  04/16/23 5\' 2"  (1.575 m)    Weight:   Wt Readings from Last 1 Encounters:  04/17/23 77.4 kg    Ideal Body Weight:  50 kg  BMI:  Body mass index is 31.21 kg/m.  Estimated Nutritional Needs:   Kcal:  1600-1800kcal/day  Protein:  80-90g/day  Fluid:  1.4-1.6L/day  Betsey Holiday MS, RD, LDN Please  refer to [facility] for RD and/or RD on-call/weekend/after hours pager

## 2023-04-18 NOTE — Progress Notes (Signed)
[facility]  ROUNDING NOTE   Subjective:   Ms. SHERRILL BUIKEMA was admitted to [facility] on 04/16/2023 for Hypercalcemia [E83.52] Electrolyte abnormality [E87.8] AKI (acute kidney injury) [facility]) [N17.9]  Patient is known to our practice from previous admissions and is followed outpatient by Dr Cherylann Ratel.   Patient is seen resting quietly in bed States she doesn't feel well today, sleepy  Partially completed breakfast tray at bedside.   Calcium 11.6 (12.9) (14.5)  Objective:  Vital signs in last 24 hours:  Temp:  [98 F (36.7 C)-99.1 F (37.3 C)] 98 F (36.7 C) (07/17 0741) Pulse Rate:  [57-64] 61 (07/17 0741) Resp:  [15-19] 19 (07/17 0741) BP: (133-148)/(42-49) 133/42 (07/17 0741) SpO2:  [91 %-96 %] 91 % (07/17 0741)  Weight change:  Filed Weights   04/16/23 1232 04/17/23 0500  Weight: 73.9 kg 77.4 kg    Intake/Output: I/O last 3 completed shifts: In: 815.9 [P.O.:200; I.V.:615.9] Out: 250 [Urine:250]   Intake/Output this shift:  Total I/O In: -  Out: 100 [Urine:100]  Physical Exam: General: Laying in bed, ill appearing  Head: Normocephalic, atraumatic. Dry oral mucosal membranes  Eyes: Anicteric  Lungs:  Diminished at bases, cough  Heart: Regular rate and rhythm  Abdomen:  Soft, nontender  Extremities:  no peripheral edema.  Neurologic: Nonfocal, moving all four extremities  Skin: No lesions, tenting  Access: Left AVF    Basic Metabolic Panel: Recent Labs  Lab 04/16/23 1235 04/17/23 0433 04/18/23 1005  NA 133* 138 134*  K 4.0 4.2 3.4*  CL 96* 102 100  CO2 25 27 24   GLUCOSE 111* 111* 137*  BUN 78* 75* 75*  CREATININE 6.17* 5.73* 5.82*  CALCIUM 14.5* 12.9* 11.6*  MG 3.9* 3.6*  --   PHOS 5.6*  --   --     Liver Function Tests: Recent Labs  Lab 04/16/23 1235  AST 24  ALT 20  ALKPHOS 58  BILITOT 0.8  PROT 8.0  ALBUMIN 4.1   No results for input(s): "LIPASE", "AMYLASE" in the last 168 hours. No results for input(s): "AMMONIA" in the  last 168 hours.  CBC: Recent Labs  Lab 04/16/23 1235 04/17/23 0433  WBC 7.2 7.6  HGB 10.6* 9.5*  HCT 33.2* 29.7*  MCV 89.7 88.4  PLT 198 194    Cardiac Enzymes: No results for input(s): "CKTOTAL", "CKMB", "CKMBINDEX", "TROPONINI" in the last 168 hours.  BNP: Invalid input(s): "POCBNP"  CBG: No results for input(s): "GLUCAP" in the last 168 hours.  Microbiology: Results for orders placed or performed during the hospital encounter of 04/16/23  SARS Coronavirus 2 by RT PCR (hospital order, performed in [facility] hospital lab) *cepheid single result test* Anterior Nasal Swab     Status: None   Collection Time: 04/16/23  2:10 PM   Specimen: Anterior Nasal Swab  Result Value Ref Range Status   SARS Coronavirus 2 by RT PCR NEGATIVE NEGATIVE Final    Comment: (NOTE) SARS-CoV-2 target nucleic acids are NOT DETECTED.  The SARS-CoV-2 RNA is generally detectable in upper and lower respiratory specimens during the acute phase of infection. The lowest concentration of SARS-CoV-2 viral copies this assay can detect is 250 copies / mL. A negative result does not preclude SARS-CoV-2 infection and should not be used as the sole basis for treatment or other patient management decisions.  A negative result may occur with improper specimen collection / handling, submission of specimen other than nasopharyngeal swab, presence of viral mutation(s) within the areas  targeted by this assay, and inadequate number of viral copies (<250 copies / mL). A negative result must be combined with clinical observations, patient history, and epidemiological information.  Fact Sheet for Patients:   RoadLapTop.co.za  Fact Sheet for Healthcare Providers: http://kim-miller.com/  This test is not yet approved or  cleared by the [place] FDA and has been authorized for detection and/or diagnosis of SARS-CoV-2 by FDA under an Emergency Use Authorization  (EUA).  This EUA will remain in effect (meaning this test can be used) for the duration of the COVID-19 declaration under Section 564(b)(1) of the Act, 21 U.S.C. section 360bbb-3(b)(1), unless the authorization is terminated or revoked sooner.  Performed at [facility], [address] [place], [place] [address]     Coagulation Studies: No results for input(s): "LABPROT", "INR" in the last 72 hours.  Urinalysis: Recent Labs    04/16/23 1636  COLORURINE YELLOW*  LABSPEC 1.008  PHURINE 8.0  GLUCOSEU NEGATIVE  HGBUR MODERATE*  BILIRUBINUR NEGATIVE  KETONESUR NEGATIVE  PROTEINUR 100*  NITRITE NEGATIVE  LEUKOCYTESUR LARGE*      Imaging: US RENAL  Result Date: 04/16/2023 CLINICAL DATA:  Acute kidney injury EXAM: RENAL / URINARY TRACT ULTRASOUND COMPLETE COMPARISON:  None Available. FINDINGS: Right Kidney: Renal measurements: 10.2 x 5.6 x 5.2 cm = volume: 156 mL. Echogenicity within normal limits. No mass or hydronephrosis visualized. Benign simple cyst requiring no further follow-up or characterization. Left Kidney: Renal measurements: 11.1 x 5.4 x 4.1 cm = volume: 127 mL. Echogenicity within normal limits. No mass or hydronephrosis visualized. Bladder: Appears normal for degree of bladder distention. Other: None. IMPRESSION: No hydronephrosis. Electronically Signed   By: Jearld Lesch M.D.   On: 04/16/2023 16:02   DG Chest Port 1 View  Result Date: 04/16/2023 CLINICAL DATA:  Provided history: Cough. EXAM: PORTABLE CHEST 1 VIEW COMPARISON:  Prior chest radiograph 03/25/2023 and earlier. FINDINGS: Cardiomegaly. Small ill-defined opacity within the lateral left lung base. No appreciable airspace consolidation on the right. No evidence of pleural effusion or pneumothorax. No acute osseous abnormality identified. Degenerative changes of the spine. IMPRESSION: IMPRESSION 1. Ill-defined opacity within the lateral left lung base, which may reflect atelectasis or pneumonia. 2.  Cardiomegaly. 3. Aortic Atherosclerosis (ICD10-I70.0). Electronically Signed   By: Jackey Loge D.O.   On: 04/16/2023 14:37     Medications:    sodium chloride 50 mL/hr at 04/17/23 1640     amLODipine  5 mg Oral Daily   azelastine  1 spray Each Nare BID   azithromycin  250 mg Oral Daily   feeding supplement  237 mL Oral BID BM   heparin  5,000 Units Subcutaneous Q8H   hydrALAZINE  100 mg Oral BID   ipratropium-albuterol  3 mL Nebulization TID   levothyroxine  150 mcg Oral Daily   metoprolol tartrate  50 mg Oral BID   mometasone-formoterol  2 puff Inhalation BID   montelukast  10 mg Oral QHS   pantoprazole  40 mg Oral Daily   rosuvastatin  40 mg Oral Daily   sertraline  100 mg Oral Daily   acetaminophen, albuterol, fluticasone, guaiFENesin, hydrALAZINE, loperamide, metoprolol tartrate, ondansetron (ZOFRAN) IV  Assessment/ Plan:  Ms. SCOTTLYN MCHANEY is a 82 y.o.  female with diabetes mellitus type II, hypertension, COPD, hypothyroidism, allergies, chronic congestive heart failure, GERD, sleep apnea, hyperlipidemia who is admitted to [facility] on 04/16/2023 for Hypercalcemia [E83.52] Electrolyte abnormality [E87.8] AKI (acute kidney injury) (HCC) [N17.9]  Acute kidney injury on  chronic kidney disease stage IV: baseline creatinine of 1.92, GFR of 26 on 03/27/23. Renal US negative for obstruction. No IV contrast exposure. Acute kidney injury likely secondary to infection, UTI, and poor oral intake. Will continue IV hydration.    Hypercalcemia:  PTH of 17 on 04/02/23.  - Calcium 14.5 on admission.  - Holding all calcium supplements  - Calcium 11.6 today, continue IVF  Hypertension with chronic kidney disease: home regimen of spironolactone, metoprolol, hydralazine, amlodipine, torsemide.  - Holding torsemide . - Blood pressure acceptable for this patient  Anemia of chronic kidney disease: hemoglobin 9.5. Normocytic.   Diabetes mellitus type II with chronic kidney disease: well controlled  with hemoglobin A1c of 5.9% on 01/22/23. Diet controlled   LOS: 1 Arlethia Basso 7/17/202411:51 AM

## 2023-04-18 NOTE — Progress Notes (Signed)
OT Cancellation Note  Patient Details Name: TAHNEE CIFUENTES MRN: [id number] DOB: 1940/12/01   Cancelled Treatment:    Reason Eval/Treat Not Completed: Medical issues which prohibited therapy. RN in room with pt, requests OT return later. Pt needs new IV placement.  Nicole Defino L. Daiwik [place], OTR/L  04/18/23, 1:33 PM

## 2023-04-18 NOTE — Progress Notes (Signed)
PT Cancellation Note  Patient Details Name: [name] MRN: [id number] DOB: May 28, 1941   Cancelled Treatment:    Reason Eval/Treat Not Completed: Fatigue/lethargy limiting ability to participate Patient sleeping upon entering the room. Did not awake to knocking but responded to verbal and tactile cues. Patient inattentive to conversation but able to verbalize she was " too tired". Encouraged patient and educated on the benefits of therapy while in the hospital, patient continued to state she couldn't do it. Patients last calcium value on the 16th was 12.9. Messaged Doctor via secure chat about patient status and refusal. Will re attempt as appropriate.   Malachi Carl, SPT  Malachi Carl 04/18/2023, 9:58 AM

## 2023-04-18 NOTE — Progress Notes (Signed)
PROGRESS NOTE    [name]  ZDG:[id number] DOB: May 06, 1941 DOA: 04/16/2023 PCP: Lauro Regulus, MD    Brief Narrative:  82 y.o. female with medical history significant of CKD-4, HTN, HLD, COPD, dCHF, hypothyroidism, OSA on CPAP, depression, who presents with weakness.    Patient was recently hospitalized from 6/21 - 6/25 due to hypocalcemia and hypomagnesemia, which were repleted and normalized prior to discharge. Pt was started on Vitamin D and Calcitriol, and magnesium oxide.  Patient states that she has generalized weakness for almost a week, which which has been progressively worsening.  No unilateral numbness or tinglings in extremities.  No facial droop or slurred speech.  Patient also reports cough with mild shortness of breath, denies chest pain, fever or chills.  She coughs up a lot of clear mucus.  She was seen in urgent care, and had negative PCR for COVID, flu and RSV.  Patient reports nausea, no vomiting, diarrhea or abdominal pain.  7/16: New onset A-fib RVR noted with heart rate 1 10-1 20s on 7/15.  Converted spontaneously after correction of electrolytes.  Assessment & Plan:   Principal Problem:   Electrolyte abnormality Active Problems:   Hypercalcemia   Hypermagnesemia   Acute renal failure superimposed on stage 4 chronic kidney disease (HCC)   CKD (chronic kidney disease) stage 5, GFR less than 15 ml/min (HCC)   COPD (chronic obstructive pulmonary disease) (HCC)   Chronic diastolic CHF (congestive heart failure) (HCC)   HLD (hyperlipidemia)   Essential hypertension   OSA on CPAP   Type 2 diabetes mellitus with obesity (HCC)   AKI (acute kidney injury) (HCC)  Electrolyte abnormality, hypercalcemia and hypermagnesemia Calcium of 14.5, magnesium 3.9, potassium 4.0 on presentation.  Likely due to overcorrection.  Mental status normal.  Electrolytes have improved, calcium 11.5 today Status post 2 doses of calcitonin Plan: Nephrology following Hold calcium  and vitamin D supplement Hold magnesium oxide PT consult pending   Acute renal failure superimposed on stage 4 chronic kidney disease (HCC): Renal ultrasound is negative for hydronephrosis.   Urinalysis negative few bacteria. Baseline creatinine 2.  Currently 5s Plan: Holding diuretics Continue gentle hydration Nephrology following  COPD (chronic obstructive pulmonary disease) (HCC) patient has cough with a lot of clear mucus production, denies chest pain has mild shortness of breath. CXR showed Ill-defined opacity within the lateral left lung base.  Patient does not have fever or leukocytosis. Covid negative 7/15 Plan: Bronchodilator  empiric azithromycin Add ceftriaxone F/u CT F/u respiratory viral panel, dimer  Dysuria New today Will f/u ua and culture   Chronic diastolic CHF (congestive heart failure) (HCC): 2D echo 06/19/2022 showed EF of 60 to 65%.  Patient has trace leg edema, no pulmonary on chest x-ray, clinically does not seem to have CHF exacerbation Plan: Diuretics on hold BNP 273.  Not necessarily indicative of decompensated heart failure   HLD (hyperlipidemia) PTA rosuvastatin   Essential hypertension -IV hydralazine as needed -Amlodipine, hydralazine, metoprolol   OSA - on CPAP nightly  New onset A-fib Noted on telemetry on 7/15.  Max ventricular rate 120.  Resolved spontaneously without intervention after correction of electrolytes.  Last echocardiogram from September 2023.  Normal ejection fraction with no diastolic dysfunction.  Will maintain on telemetry for now.  Defer cardiology consultation and anticoagulation for now.  If patient goes back into rapid atrial fibrillation consider cardiology evaluation. Will update tte.   DVT prophylaxis: SQ heparin Code Status: Full Family Communication: Daughter theresa updated telephonically 7/17.  She is primary contact (husband has dementia). Requests I leave a message tomorrow if she doesn't answer. Disposition  Plan: Status is: Inpatient Remains inpatient appropriate because: Severe AKI     Level of care: Telemetry Medical  Consultants:  Nephrology  Procedures:  None  Antimicrobials: Azithromycin   Subjective: Seen and examined.  Resting in bed.  No visible distress. Reports ongoing weakness  Objective: Vitals:   04/18/23 0418 04/18/23 0531 04/18/23 0741 04/18/23 1303  BP: (!) 140/49  (!) 133/42   Pulse: 63  61   Resp: 18  19   Temp: 98.8 F (37.1 C)  98 F (36.7 C)   TempSrc: Oral     SpO2: 96% 96% 91% 96%  Weight:      Height:        Intake/Output Summary (Last 24 hours) at 04/18/2023 1324 Last data filed at 04/18/2023 0900 Gross per 24 hour  Intake 615.9 ml  Output 350 ml  Net 265.9 ml   Filed Weights   04/16/23 1232 04/17/23 0500  Weight: 73.9 kg 77.4 kg    Examination:  General exam: NAD.  Appears frail Respiratory system: faint rhonchi. Respiratory effort normal. Cardiovascular system: S1-S2, RRR, no murmurs, trace pedal edema Gastrointestinal system: Soft, NT/ND, normal bowel sounds Central nervous system: Alert and oriented. No focal neurological deficits. Extremities: Symmetrical   power bilaterally Skin: No rashes, lesions or ulcers Psychiatry: Judgement and insight appear normal. Mood & affect appropriate.     Data Reviewed: I have personally reviewed following labs and imaging studies  CBC: Recent Labs  Lab 04/16/23 1235 04/17/23 0433  WBC 7.2 7.6  HGB 10.6* 9.5*  HCT 33.2* 29.7*  MCV 89.7 88.4  PLT 198 194   Basic Metabolic Panel: Recent Labs  Lab 04/16/23 1235 04/17/23 0433 04/18/23 1005  NA 133* 138 134*  K 4.0 4.2 3.4*  CL 96* 102 100  CO2 25 27 24   GLUCOSE 111* 111* 137*  BUN 78* 75* 75*  CREATININE 6.17* 5.73* 5.82*  CALCIUM 14.5* 12.9* 11.6*  MG 3.9* 3.6*  --   PHOS 5.6*  --   --    GFR: Estimated Creatinine Clearance: 7.2 mL/min (A) (by C-G formula based on SCr of 5.82 mg/dL (H)). Liver Function Tests: Recent  Labs  Lab 04/16/23 1235  AST 24  ALT 20  ALKPHOS 58  BILITOT 0.8  PROT 8.0  ALBUMIN 4.1   No results for input(s): "LIPASE", "AMYLASE" in the last 168 hours. No results for input(s): "AMMONIA" in the last 168 hours. Coagulation Profile: No results for input(s): "INR", "PROTIME" in the last 168 hours. Cardiac Enzymes: No results for input(s): "CKTOTAL", "CKMB", "CKMBINDEX", "TROPONINI" in the last 168 hours. BNP (last 3 results) No results for input(s): "PROBNP" in the last 8760 hours. HbA1C: No results for input(s): "HGBA1C" in the last 72 hours. CBG: No results for input(s): "GLUCAP" in the last 168 hours. Lipid Profile: No results for input(s): "CHOL", "HDL", "LDLCALC", "TRIG", "CHOLHDL", "LDLDIRECT" in the last 72 hours. Thyroid Function Tests: No results for input(s): "TSH", "T4TOTAL", "FREET4", "T3FREE", "THYROIDAB" in the last 72 hours. Anemia Panel: No results for input(s): "VITAMINB12", "FOLATE", "FERRITIN", "TIBC", "IRON", "RETICCTPCT" in the last 72 hours. Sepsis Labs: No results for input(s): "PROCALCITON", "LATICACIDVEN" in the last 168 hours.  Recent Results (from the past 240 hour(s))  SARS Coronavirus 2 by RT PCR (hospital order, performed in [facility] hospital lab) *cepheid single result test* Anterior Nasal Swab     Status: None  Collection Time: 04/16/23  2:10 PM   Specimen: Anterior Nasal Swab  Result Value Ref Range Status   SARS Coronavirus 2 by RT PCR NEGATIVE NEGATIVE Final    Comment: (NOTE) SARS-CoV-2 target nucleic acids are NOT DETECTED.  The SARS-CoV-2 RNA is generally detectable in upper and lower respiratory specimens during the acute phase of infection. The lowest concentration of SARS-CoV-2 viral copies this assay can detect is 250 copies / mL. A negative result does not preclude SARS-CoV-2 infection and should not be used as the sole basis for treatment or other patient management decisions.  A negative result may occur with improper  specimen collection / handling, submission of specimen other than nasopharyngeal swab, presence of viral mutation(s) within the areas targeted by this assay, and inadequate number of viral copies (<250 copies / mL). A negative result must be combined with clinical observations, patient history, and epidemiological information.  Fact Sheet for Patients:   RoadLapTop.co.za  Fact Sheet for Healthcare Providers: http://kim-miller.com/  This test is not yet approved or  cleared by the [place] FDA and has been authorized for detection and/or diagnosis of SARS-CoV-2 by FDA under an Emergency Use Authorization (EUA).  This EUA will remain in effect (meaning this test can be used) for the duration of the COVID-19 declaration under Section 564(b)(1) of the Act, 21 U.S.C. section 360bbb-3(b)(1), unless the authorization is terminated or revoked sooner.  Performed at [facility], [address] [place], [place] [address]          Radiology Studies: US RENAL  Result Date: 04/16/2023 CLINICAL DATA:  Acute kidney injury EXAM: RENAL / URINARY TRACT ULTRASOUND COMPLETE COMPARISON:  None Available. FINDINGS: Right Kidney: Renal measurements: 10.2 x 5.6 x 5.2 cm = volume: 156 mL. Echogenicity within normal limits. No mass or hydronephrosis visualized. Benign simple cyst requiring no further follow-up or characterization. Left Kidney: Renal measurements: 11.1 x 5.4 x 4.1 cm = volume: 127 mL. Echogenicity within normal limits. No mass or hydronephrosis visualized. Bladder: Appears normal for degree of bladder distention. Other: None. IMPRESSION: No hydronephrosis. Electronically Signed   By: Jearld Lesch M.D.   On: 04/16/2023 16:02   DG Chest Port 1 View  Result Date: 04/16/2023 CLINICAL DATA:  Provided history: Cough. EXAM: PORTABLE CHEST 1 VIEW COMPARISON:  Prior chest radiograph 03/25/2023 and earlier. FINDINGS: Cardiomegaly. Small  ill-defined opacity within the lateral left lung base. No appreciable airspace consolidation on the right. No evidence of pleural effusion or pneumothorax. No acute osseous abnormality identified. Degenerative changes of the spine. IMPRESSION: IMPRESSION 1. Ill-defined opacity within the lateral left lung base, which may reflect atelectasis or pneumonia. 2. Cardiomegaly. 3. Aortic Atherosclerosis (ICD10-I70.0). Electronically Signed   By: Jackey Loge D.O.   On: 04/16/2023 14:37        Scheduled Meds:  amLODipine  5 mg Oral Daily   azelastine  1 spray Each Nare BID   azithromycin  250 mg Oral Daily   feeding supplement  237 mL Oral BID BM   heparin  5,000 Units Subcutaneous Q8H   hydrALAZINE  100 mg Oral BID   ipratropium-albuterol  3 mL Nebulization TID   levothyroxine  150 mcg Oral Daily   metoprolol tartrate  50 mg Oral BID   mometasone-formoterol  2 puff Inhalation BID   montelukast  10 mg Oral QHS   pantoprazole  40 mg Oral Daily   rosuvastatin  40 mg Oral Daily   sertraline  100 mg Oral Daily  Continuous Infusions:  sodium chloride 50 mL/hr at 04/17/23 1640     LOS: 1 day    Silvano Bilis, MD [facility]   If 7PM-7AM, please contact night-coverage  04/18/2023, 1:24 PM

## 2023-04-18 NOTE — Plan of Care (Signed)
   Problem: Clinical Measurements: Goal: Ability to maintain clinical measurements within normal limits will improve Outcome: Progressing   Problem: Clinical Measurements: Goal: Will remain free from infection Outcome: Progressing   Problem: Clinical Measurements: Goal: Diagnostic test results will improve Outcome: Progressing   Problem: Clinical Measurements: Goal: Respiratory complications will improve Outcome: Progressing   

## 2023-04-19 ENCOUNTER — Inpatient Hospital Stay (HOSPITAL_COMMUNITY)
Admit: 2023-04-19 | Discharge: 2023-04-19 | Disposition: A | Discharge status: Home / Self Care | Payer: Medicare Other | Attending: Obstetrics and Gynecology | Admitting: Obstetrics and Gynecology

## 2023-04-19 DIAGNOSIS — E878 Other disorders of electrolyte and fluid balance, not elsewhere classified: Secondary | ICD-10-CM | POA: Diagnosis not present

## 2023-04-19 DIAGNOSIS — I4891 Unspecified atrial fibrillation: Secondary | ICD-10-CM | POA: Diagnosis not present

## 2023-04-19 LAB — ECHOCARDIOGRAM COMPLETE BUBBLE STUDY
AR max vel: 2.26 cm2
AV Area VTI: 2.4 cm2
AV Area mean vel: 2.38 cm2
AV Mean grad: 7 mmHg
AV Peak grad: 14 mmHg
Ao pk vel: 1.87 m/s
Area-P 1/2: 3.31 cm2
MV VTI: 2.81 cm2
S' Lateral: 3.1 cm

## 2023-04-19 LAB — CBC
HCT: 27.9 % — ABNORMAL LOW (ref 36.0–46.0)
Hemoglobin: 9 g/dL — ABNORMAL LOW (ref 12.0–15.0)
MCH: 28.4 pg (ref 26.0–34.0)
MCHC: 32.3 g/dL (ref 30.0–36.0)
MCV: 88 fL (ref 80.0–100.0)
Platelets: 164 10*3/uL (ref 150–400)
RBC: 3.17 MIL/uL — ABNORMAL LOW (ref 3.87–5.11)
RDW: 13.3 % (ref 11.5–15.5)
WBC: 7.8 10*3/uL (ref 4.0–10.5)
nRBC: 0 % (ref 0.0–0.2)

## 2023-04-19 LAB — URINE CULTURE

## 2023-04-19 LAB — RENAL FUNCTION PANEL
Albumin: 3.2 g/dL — ABNORMAL LOW (ref 3.5–5.0)
Anion gap: 9 (ref 5–15)
BUN: 73 mg/dL — ABNORMAL HIGH (ref 8–23)
CO2: 24 mmol/L (ref 22–32)
Calcium: 11.2 mg/dL — ABNORMAL HIGH (ref 8.9–10.3)
Chloride: 103 mmol/L (ref 98–111)
Creatinine, Ser: 5.02 mg/dL — ABNORMAL HIGH (ref 0.44–1.00)
GFR, Estimated: 8 mL/min — ABNORMAL LOW (ref >60)
Glucose, Bld: 101 mg/dL — ABNORMAL HIGH (ref 70–99)
Phosphorus: 4.3 mg/dL (ref 2.5–4.6)
Potassium: 3.8 mmol/L (ref 3.5–5.1)
Sodium: 136 mmol/L (ref 135–145)

## 2023-04-19 LAB — EXPECTORATED SPUTUM ASSESSMENT W GRAM STAIN, RFLX TO RESP C

## 2023-04-19 LAB — ALBUMIN: Albumin: 3.3 g/dL — ABNORMAL LOW (ref 3.5–5.0)

## 2023-04-19 LAB — STREP PNEUMONIAE URINARY ANTIGEN: Strep Pneumo Urinary Antigen: NEGATIVE

## 2023-04-19 LAB — MAGNESIUM: Magnesium: 2.6 mg/dL — ABNORMAL HIGH (ref 1.7–2.4)

## 2023-04-19 MED ORDER — IPRATROPIUM-ALBUTEROL 0.5-2.5 (3) MG/3ML IN SOLN
3.0000 mL | Freq: Four times a day (QID) | RESPIRATORY_TRACT | Status: DC | PRN
  Administered 2023-04-19 – 2023-04-21 (×2): 3 mL via RESPIRATORY_TRACT
  Filled 2023-04-19: qty 3

## 2023-04-19 MED ORDER — AMLODIPINE BESYLATE 10 MG PO TABS
10.0000 mg | ORAL_TABLET | Freq: Every day | ORAL | Status: DC
  Administered 2023-04-20 – 2023-04-22 (×3): 10 mg via ORAL
  Filled 2023-04-19 (×3): qty 1

## 2023-04-19 NOTE — TOC Initial Note (Signed)
Transition of Care [facility]) - Initial/Assessment Note    Patient Details  Name: [name] MRN: [id number] Date of Birth: Dec 17, 1940  [facility] CM/SW Contact:    Chapman Fitch, RN Phone Number: 04/19/2023, 1:11 PM  Clinical Narrative:                  Met with patient, spouse, and daughter Tammy at bedside  Admitted for: electrolyte abnormality  Admitted from: home with husband PCP: Dareen Piano Current home health/prior home health/DME: [facility] home health Rn PT SW, WC, cane, bsc  PT recommending home health. Patient wishes to return home with resumption of home health  Patient did not qualify for home o2 Daughter Tammy states that she will transport at discharge   Expected Discharge Plan: Home w Home Health Services Barriers to Discharge: Continued Medical Work up   Patient Goals and CMS Choice            Expected Discharge Plan and Services       Living arrangements for the past 2 months: Single Family Home                                      Prior Living Arrangements/Services Living arrangements for the past 2 months: Single Family Home Lives with:: Spouse              Current home services: DME    Activities of Daily Living Home Assistive Devices/Equipment: Shower chair with back, Environmental consultant (specify type), Wheelchair ADL Screening (condition at time of admission) Patient's cognitive ability adequate to safely complete daily activities?: Yes Is the patient deaf or have difficulty hearing?: No Does the patient have difficulty seeing, even when wearing glasses/contacts?: No Does the patient have difficulty concentrating, remembering, or making decisions?: No Patient able to express need for assistance with ADLs?: Yes Does the patient have difficulty dressing or bathing?: Yes Independently performs ADLs?: No Communication: Independent Dressing (OT): Needs assistance Is this a change from baseline?: Change from baseline, expected  to last <3days Grooming: Independent Feeding: Independent Bathing: Needs assistance Is this a change from baseline?: Change from baseline, expected to last <3 days Toileting: Needs assistance Is this a change from baseline?: Change from baseline, expected to last <3 days In/Out Bed: Needs assistance Is this a change from baseline?: Change from baseline, expected to last <3 days Walks in Home: Needs assistance Is this a change from baseline?: Change from baseline, expected to last <3 days Does the patient have difficulty walking or climbing stairs?: Yes Weakness of Legs: Both Weakness of Arms/Hands: Both  Permission Sought/Granted                  Emotional Assessment              Admission diagnosis:  Hypercalcemia [E83.52] Electrolyte abnormality [E87.8] AKI (acute kidney injury) (HCC) [N17.9] Patient Active Problem List   Diagnosis Date Noted   Hypercalcemia 04/16/2023   Hypermagnesemia 04/16/2023   HLD (hyperlipidemia) 04/16/2023   COPD (chronic obstructive pulmonary disease) (HCC) 04/16/2023   Anemia in chronic kidney disease 03/27/2023   Electrolyte abnormality 03/24/2023   CKD (chronic kidney disease) stage 5, 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   Hyperparathyroidism, unspecified (HCC) 08/05/2022   Metabolic acidosis 08/02/2022   Uremia of renal origin 08/02/2022   Uremia 08/02/2022   Acute metabolic encephalopathy 08/02/2022   Chronic diastolic CHF (congestive  heart failure) (HCC) 08/02/2022   Chronic bronchitis (HCC) 08/02/2022   Anemia of chronic kidney failure, stage 5 (HCC) 08/02/2022   Dizziness s/p fall 08/02/2022   AKI (acute kidney injury) (HCC) 06/12/2022   Hypomagnesemia 06/12/2022   Hyperphosphatemia 06/12/2022   Pneumonia due to COVID-19 virus 06/10/2022   Hypocalcemia, symptomatic 06/10/2022   Dyslipidemia 06/10/2022   Depression 06/10/2022   Right shoulder pain 06/10/2022   Generalized weakness    Nausea vomiting and diarrhea     Chronic obstructive pulmonary disease, unspecified (HCC) 10/02/2021   Gouty arthropathy, chronic, 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   Hypothyroidism 09/01/2019   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/25/2019   Multinodular goiter 07/31/2019   Class 2 severe obesity with serious comorbidity in adult [facility]) 12/12/2018   Type 2 diabetes mellitus with obesity (HCC) 09/04/2017   Bilateral lower extremity edema 09/04/2017   Gastroesophageal reflux disease without esophagitis 05/16/2017   Vomiting and diarrhea 12/29/2016   Enteritis 12/29/2016   Acute lower UTI 12/29/2016   Incomplete emptying of bladder 11/01/2016   Urge incontinence 07/14/2015   Acute renal failure superimposed on stage 4 chronic kidney disease (HCC) 08/06/2014   Renal mass, right 08/06/2014   Precordial pain 07/24/2014   Dyspnea 07/24/2014   Essential hypertension 07/24/2014   Hyperlipidemia 07/24/2014   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 [place] [address] Phone: [phone] Fax: [phone]     Social Determinants of Health (SDOH) Social History: SDOH Screenings   Food Insecurity: No Food Insecurity (04/16/2023)  Housing: Low Risk  (04/16/2023)  Transportation Needs: No Transportation Needs (04/16/2023)  Utilities: Not At Risk (04/16/2023)  Financial Resource Strain: Low Risk  (01/22/2023)   [facility] from [facility]  Tobacco Use: Low Risk  (04/16/2023)   SDOH Interventions:     Readmission Risk Interventions    04/19/2023    1:10 PM 03/27/2023   12:56 PM  Readmission Risk Prevention Plan  Transportation Screening Complete Complete  Medication Review (RN Care Manager) Complete Complete  PCP or  Specialist appointment within 3-5 days of discharge  Complete  HRI or Home Care Consult Complete Complete  SW Recovery Care/Counseling Consult Complete Complete  Palliative Care Screening Not Applicable Not Applicable  Skilled Nursing Facility Not Applicable Not Applicable

## 2023-04-19 NOTE — Progress Notes (Signed)
   04/19/23 2105  BiPAP/CPAP/SIPAP  $ Non-Invasive Home Ventilator  Subsequent  BiPAP/CPAP/SIPAP Pt Type Adult  Mask Type Nasal pillows  Respiratory Rate 17 breaths/min  FiO2 (%) 21 %  Patient Home Equipment Yes  Auto Titrate Yes  Safety Check Completed by RT for Home Unit Yes, no issues noted   Patient has home unit at bedside. Unit appears to be in proper working condition, no loose or frayed cords, plugged into red outlet. Patient denies further assistance from RT as she self manages home CPAP.

## 2023-04-19 NOTE — Progress Notes (Signed)
[facility]  ROUNDING NOTE   Subjective:   [name] was admitted to [facility] on 04/16/2023 for Hypercalcemia [E83.52] Electrolyte abnormality [E87.8] AKI (acute kidney injury) [facility]) [N17.9]  Patient is known to our practice from previous admissions and is followed outpatient by Dr Cherylann Ratel.   Patient seen standing at side of bed Preparing to ambulate with mobility tech  Husband and daughter at bedside States she feel stronger today  Calcium 11.2 (11.4) (12.9) (14.5)  Objective:  Vital signs in last 24 hours:  Temp:  [97.8 F (36.6 C)-99 F (37.2 C)] 99 F (37.2 C) (07/18 0858) Pulse Rate:  [56-63] 57 (07/18 0858) Resp:  [18-19] 19 (07/18 0400) BP: (141-184)/(41-51) 162/46 (07/18 0858) SpO2:  [95 %-99 %] 95 % (07/18 0858) Weight:  [78.3 kg] 78.3 kg (07/18 0331)  Weight change:  Filed Weights   04/16/23 1232 04/17/23 0500 04/19/23 0331  Weight: 73.9 kg 77.4 kg 78.3 kg    Intake/Output: I/O last 3 completed shifts: In: 1364.6 [P.O.:120; I.V.:1144.6; IV Piggyback:100] Out: 500 [Urine:500]   Intake/Output this shift:  No intake/output data recorded.  Physical Exam: General: Laying in bed  Head: Normocephalic, atraumatic. Dry oral mucosal membranes  Eyes: Anicteric  Lungs:  Diminished at bases  Heart: Regular rate and rhythm  Abdomen:  Soft, nontender  Extremities:  no peripheral edema.  Neurologic: Nonfocal, moving all four extremities  Skin: No lesions, tenting  Access: Left AVF    Basic Metabolic Panel: Recent Labs  Lab 04/16/23 1235 04/17/23 0433 04/18/23 1005 04/18/23 1356 04/19/23 0358  NA 133* 138 134* 134* 136  K 4.0 4.2 3.4* 3.8 3.8  CL 96* 102 100 103 103  CO2 25 27 24 24 24   GLUCOSE 111* 111* 137* 90 101*  BUN 78* 75* 75* 69* 73*  CREATININE 6.17* 5.73* 5.82* 5.46* 5.02*  CALCIUM 14.5* 12.9* 11.6* 11.4* 11.2*  MG 3.9* 3.6*  --   --  2.6*  PHOS 5.6*  --   --  4.7* 4.3    Liver Function Tests: Recent Labs  Lab  04/16/23 1235 04/18/23 1356 04/19/23 0358  AST 24  --   --   ALT 20  --   --   ALKPHOS 58  --   --   BILITOT 0.8  --   --   PROT 8.0  --   --   ALBUMIN 4.1 3.3* 3.2*  3.3*   No results for input(s): "LIPASE", "AMYLASE" in the last 168 hours. No results for input(s): "AMMONIA" in the last 168 hours.  CBC: Recent Labs  Lab 04/16/23 1235 04/17/23 0433 04/19/23 0358  WBC 7.2 7.6 7.8  HGB 10.6* 9.5* 9.0*  HCT 33.2* 29.7* 27.9*  MCV 89.7 88.4 88.0  PLT 198 194 164    Cardiac Enzymes: No results for input(s): "CKTOTAL", "CKMB", "CKMBINDEX", "TROPONINI" in the last 168 hours.  BNP: Invalid input(s): "POCBNP"  CBG: No results for input(s): "GLUCAP" in the last 168 hours.  Microbiology: Results for orders placed or performed during the hospital encounter of 04/16/23  SARS Coronavirus 2 by RT PCR (hospital order, performed in [facility] hospital lab) *cepheid single result test* Anterior Nasal Swab     Status: None   Collection Time: 04/16/23  2:10 PM   Specimen: Anterior Nasal Swab  Result Value Ref Range Status   SARS Coronavirus 2 by RT PCR NEGATIVE NEGATIVE Final    Comment: (NOTE) SARS-CoV-2 target nucleic acids are NOT DETECTED.  The SARS-CoV-2 RNA  is generally detectable in upper and lower respiratory specimens during the acute phase of infection. The lowest concentration of SARS-CoV-2 viral copies this assay can detect is 250 copies / mL. A negative result does not preclude SARS-CoV-2 infection and should not be used as the sole basis for treatment or other patient management decisions.  A negative result may occur with improper specimen collection / handling, submission of specimen other than nasopharyngeal swab, presence of viral mutation(s) within the areas targeted by this assay, and inadequate number of viral copies (<250 copies / mL). A negative result must be combined with clinical observations, patient history, and epidemiological information.  Fact  Sheet for Patients:   RoadLapTop.co.za  Fact Sheet for Healthcare Providers: http://kim-miller.com/  This test is not yet approved or  cleared by the [place] FDA and has been authorized for detection and/or diagnosis of SARS-CoV-2 by FDA under an Emergency Use Authorization (EUA).  This EUA will remain in effect (meaning this test can be used) for the duration of the COVID-19 declaration under Section 564(b)(1) of the Act, 21 U.S.C. section 360bbb-3(b)(1), unless the authorization is terminated or revoked sooner.  Performed at [facility], [address] [place], [place] [address]   Respiratory (~20 pathogens) panel by PCR     Status: None   Collection Time: 04/18/23  1:27 PM   Specimen: Nasopharyngeal Swab; Respiratory  Result Value Ref Range Status   Adenovirus NOT DETECTED NOT DETECTED Final   Coronavirus 229E NOT DETECTED NOT DETECTED Final    Comment: (NOTE) The Coronavirus on the Respiratory Panel, DOES NOT test for the novel  Coronavirus (2019 nCoV)    Coronavirus HKU1 NOT DETECTED NOT DETECTED Final   Coronavirus NL63 NOT DETECTED NOT DETECTED Final   Coronavirus OC43 NOT DETECTED NOT DETECTED Final   Metapneumovirus NOT DETECTED NOT DETECTED Final   Rhinovirus / Enterovirus NOT DETECTED NOT DETECTED Final   Influenza A NOT DETECTED NOT DETECTED Final   Influenza B NOT DETECTED NOT DETECTED Final   Parainfluenza Virus 1 NOT DETECTED NOT DETECTED Final   Parainfluenza Virus 2 NOT DETECTED NOT DETECTED Final   Parainfluenza Virus 3 NOT DETECTED NOT DETECTED Final   Parainfluenza Virus 4 NOT DETECTED NOT DETECTED Final   Respiratory Syncytial Virus NOT DETECTED NOT DETECTED Final   Bordetella pertussis NOT DETECTED NOT DETECTED Final   Bordetella Parapertussis NOT DETECTED NOT DETECTED Final   Chlamydophila pneumoniae NOT DETECTED NOT DETECTED Final   Mycoplasma pneumoniae NOT DETECTED NOT DETECTED Final     Comment: Performed at [facility], [address], [place], [place] [address]    Coagulation Studies: No results for input(s): "LABPROT", "INR" in the last 72 hours.  Urinalysis: Recent Labs    04/16/23 1636 04/18/23 1327  COLORURINE YELLOW* YELLOW*  LABSPEC 1.008 1.010  PHURINE 8.0 7.0  GLUCOSEU NEGATIVE NEGATIVE  HGBUR MODERATE* SMALL*  BILIRUBINUR NEGATIVE NEGATIVE  KETONESUR NEGATIVE NEGATIVE  PROTEINUR 100* 100*  NITRITE NEGATIVE NEGATIVE  LEUKOCYTESUR LARGE* LARGE*      Imaging: CT CHEST WO CONTRAST  Result Date: 04/18/2023 CLINICAL DATA:  Cough EXAM: CT CHEST WITHOUT CONTRAST TECHNIQUE: Multidetector CT imaging of the chest was performed following the standard protocol without IV contrast. RADIATION DOSE REDUCTION: This exam was performed according to the departmental dose-optimization program which includes automated exposure control, adjustment of the mA and/or kV according to patient size and/or use of iterative reconstruction technique. COMPARISON:  CT 06/10/2022, chest x-ray 04/16/2023, renal ultrasound 02/16/2021 FINDINGS:  Cardiovascular: Limited evaluation without intravenous contrast. Moderate aortic atherosclerosis. No aneurysm. Cardiomegaly. Coronary vascular calcification. No pericardial effusion Mediastinum/Nodes: Midline trachea. No thyroid mass. No suspicious mediastinal lymph nodes. Esophagus within normal limits. Lungs/Pleura: Bandlike densities within the bilateral lower lobes felt consistent with subsegmental atelectasis. No pleural effusion or pneumothorax. Upper Abdomen: No acute finding. Partially visualized right renal cysts, no specific imaging follow-up recommended. Musculoskeletal: No acute or suspicious osseous abnormality. IMPRESSION: 1. Bandlike densities within the bilateral lower lobes felt consistent with subsegmental atelectasis. 2. Cardiomegaly. 3. Aortic atherosclerosis. Aortic Atherosclerosis (ICD10-I70.0). Electronically Signed   By: Jasmine Pang M.D.   On: 04/18/2023 17:41     Medications:    sodium chloride 50 mL/hr at 04/18/23 1413   cefTRIAXone (ROCEPHIN)  IV 1 g (04/18/23 1432)     [START ON 04/20/2023] amLODipine  10 mg Oral Daily   azelastine  1 spray Each Nare BID   azithromycin  250 mg Oral Daily   feeding supplement  237 mL Oral BID BM   heparin  5,000 Units Subcutaneous Q8H   hydrALAZINE  100 mg Oral BID   levothyroxine  150 mcg Oral Daily   metoprolol tartrate  50 mg Oral BID   mometasone-formoterol  2 puff Inhalation BID   montelukast  10 mg Oral QHS   multivitamin with minerals  1 tablet Oral Daily   pantoprazole  40 mg Oral Daily   rosuvastatin  40 mg Oral Daily   sertraline  100 mg Oral Daily   acetaminophen, albuterol, fluticasone, guaiFENesin, hydrALAZINE, ipratropium-albuterol, loperamide, metoprolol tartrate, ondansetron (ZOFRAN) IV  Assessment/ Plan:  [name] is a 82 y.o.  female with diabetes mellitus type II, hypertension, COPD, hypothyroidism, allergies, chronic congestive heart failure, GERD, sleep apnea, hyperlipidemia who is admitted to [facility] on 04/16/2023 for Hypercalcemia [E83.52] Electrolyte abnormality [E87.8] AKI (acute kidney injury) (HCC) [N17.9]  Acute kidney injury on chronic kidney disease stage IV: baseline creatinine of 1.92, GFR of 26 on 03/27/23. Renal US negative for obstruction. No IV contrast exposure. Acute kidney injury likely secondary to infection, UTI, and poor oral intake.  - Creatinine improving with IVF.  - Will continue fluids for now - No acute need for dialysis  Hypercalcemia:  PTH of 17 on 04/02/23.  - Calcium 14.5 on admission.  - Holding all calcium supplements  - Calcium continues to slowly decrease - Continue IVF and monitoring - Calcium goal below 11 before considering discharge.   Hypertension with chronic kidney disease: home regimen of spironolactone, metoprolol, hydralazine, amlodipine, torsemide.  - Holding torsemide . - Blood pressure  stable for this patient   Anemia of chronic kidney disease: hemoglobin 9.0. Normocytic.   Diabetes mellitus type II with chronic kidney disease: well controlled with hemoglobin A1c of 5.9% on 01/22/23. Diet controlled   LOS: 2 Zygmund Passero 7/18/202412:52 PM

## 2023-04-19 NOTE — Progress Notes (Addendum)
Mobility Specialist - Progress Note   04/19/23 1100  Mobility  Activity Ambulated with assistance in hallway  Level of Assistance Standby assist, set-up cues, supervision of patient - no hands on  Assistive Device Front wheel walker  Distance Ambulated (ft) 55 ft  Activity Response Tolerated well  $Mobility charge 1 Mobility     Nurse requested Mobility Specialist to perform oxygen saturation test with pt which includes removing pt from oxygen both at rest and while ambulating.  Below are the results from that testing.     O2 while resting on RA = 99%   O2 while AMB on RA = 91%   O2 while AMB on ?L = N/A    Pt lying in bed upon arrival, utilizing 1L. Pt agreeable to activity. Completed bed mobility modI + extra time. STS and ambulation with close supervision. No LOB. Complaints of SOB with exertion, however O2 sats maintained > 90% throughout activity. Distance limited d/t fatigue. Pt returned to bed with alarm set, needs in reach. Family at bedside. Reported results to nurse.    [name] Mobility Specialist 04/19/23, 11:35 AM

## 2023-04-19 NOTE — Progress Notes (Addendum)
PROGRESS NOTE    [name]  NWG:[id number] DOB: 1941/01/15 DOA: 04/16/2023 PCP: Lauro Regulus, MD    Brief Narrative:  82 y.o. female with medical history significant of CKD-4, HTN, HLD, COPD, dCHF, hypothyroidism, OSA on CPAP, depression, who presents with weakness.    Patient was recently hospitalized from 6/21 - 6/25 due to hypocalcemia and hypomagnesemia, which were repleted and normalized prior to discharge. Pt was started on Vitamin D and Calcitriol, and magnesium oxide.  Patient states that she has generalized weakness for almost a week, which which has been progressively worsening.  No unilateral numbness or tinglings in extremities.  No facial droop or slurred speech.  Patient also reports cough with mild shortness of breath, denies chest pain, fever or chills.  She coughs up a lot of clear mucus.  She was seen in urgent care, and had negative PCR for COVID, flu and RSV.  Patient reports nausea, no vomiting, diarrhea or abdominal pain.  7/16: New onset A-fib RVR noted with heart rate 1 10-1 20s on 7/15.  Converted spontaneously after correction of electrolytes.  Assessment & Plan:   Principal Problem:   Electrolyte abnormality Active Problems:   Hypercalcemia   Hypermagnesemia   Acute renal failure superimposed on stage 4 chronic kidney disease (HCC)   CKD (chronic kidney disease) stage 5, GFR less than 15 ml/min (HCC)   COPD (chronic obstructive pulmonary disease) (HCC)   Chronic diastolic CHF (congestive heart failure) (HCC)   HLD (hyperlipidemia)   Essential hypertension   OSA on CPAP   Type 2 diabetes mellitus with obesity (HCC)   AKI (acute kidney injury) (HCC)  Electrolyte abnormality, hypercalcemia and hypermagnesemia Calcium of 14.5, magnesium 3.9, potassium 4.0 on presentation.  Likely due to overcorrection, recent calcitriol.  Mental status normal.  Electrolytes have improved, corrected ca 11.8 today Plan: Nephrology following Hold calcium and vitamin  D supplement Hold magnesium oxide  Debility PT now advising home health, will plan on that at d/c. Orders have been placed (pt/ot/rn/sw)   Acute renal failure superimposed on stage 4 chronic kidney disease [facility]): Renal ultrasound is negative for hydronephrosis.   Urinalysis negative few bacteria. Baseline creatinine 2.  Currently 5s Plan: Holding diuretics Continue gentle hydration Nephrology following  COPD (chronic obstructive pulmonary disease) (HCC) CAP patient has cough with a lot of clear mucus production, denies chest pain has mild shortness of breath. CXR showed Ill-defined opacity within the lateral left lung base.  Patient does not have fever or leukocytosis. Covid negative 7/15. Rvp neg. Dimer neg. Still with sig cough but it's improving. CT with signs atelectasis Plan: Bronchodilator  empiric azithromycin Added ceftriaxone Start incentive spirometry Will check urine antigens and sputum culture  Dysuria New 7/17, ua consistent w/ possible infection Continuing ceftriaxone as above, f/u culture   Chronic diastolic CHF (congestive heart failure) (HCC): 2D echo 06/19/2022 showed EF of 60 to 65%.  Patient has trace leg edema, no pulmonary on chest x-ray, clinically does not seem to have CHF exacerbation Plan: Diuretics on hold BNP 273.  Not necessarily indicative of decompensated heart failure   HLD (hyperlipidemia) PTA rosuvastatin   Essential hypertension Bp elevated -IV hydralazine as needed -Amlodipine will increase to 10, cont hydralazine, metoprolol   OSA - on CPAP nightly  New onset A-fib Noted on telemetry on 7/15.  Max ventricular rate 120.  Resolved spontaneously without intervention after correction of electrolytes.  Last echocardiogram from September 2023.  Normal ejection fraction with no diastolic dysfunction.  Will  maintain on telemetry for now.  Defer cardiology consultation and anticoagulation for now.  If patient goes back into rapid atrial  fibrillation consider cardiology evaluation. Will update tte, still pending   DVT prophylaxis: SQ heparin Code Status: Full Family Communication: Daughter theresa updated telephonically 7/17. She is primary contact (husband has dementia). Requests I leave a message tomorrow if she doesn't answer. Disposition Plan: Status is: Inpatient Remains inpatient appropriate because: Severe AKI     Level of care: Telemetry Medical  Consultants:  Nephrology  Procedures:  None  Antimicrobials: Azithromycin   Subjective: Seen and examined.  Ongoing cough but thinks breathing is better. Ambulated with pt earlier today. Weakness improving  Objective: Vitals:   04/19/23 0332 04/19/23 0400 04/19/23 0738 04/19/23 0858  BP: (!) 184/50 (!) 165/51  (!) 162/46  Pulse: (!) 58 (!) 56  (!) 57  Resp: 18 19    Temp: 98.8 F (37.1 C)   99 F (37.2 C)  TempSrc: Oral Oral  Oral  SpO2: 97% 97% 99% 95%  Weight:      Height:        Intake/Output Summary (Last 24 hours) at 04/19/2023 1203 Last data filed at 04/18/2023 1930 Gross per 24 hour  Intake 748.67 ml  Output 400 ml  Net 348.67 ml   Filed Weights   04/16/23 1232 04/17/23 0500 04/19/23 0331  Weight: 73.9 kg 77.4 kg 78.3 kg    Examination:  General exam: NAD.  Appears frail Respiratory system: faint rhonchi. Respiratory effort normal. Cardiovascular system: S1-S2, RRR, no murmurs, trace pedal edema Gastrointestinal system: Soft, NT/ND, normal bowel sounds Central nervous system: Alert and oriented. No focal neurological deficits. Extremities: Symmetrical   power bilaterally Skin: No rashes, lesions or ulcers Psychiatry: Judgement and insight appear normal. Mood & affect appropriate.     Data Reviewed: I have personally reviewed following labs and imaging studies  CBC: Recent Labs  Lab 04/16/23 1235 04/17/23 0433 04/19/23 0358  WBC 7.2 7.6 7.8  HGB 10.6* 9.5* 9.0*  HCT 33.2* 29.7* 27.9*  MCV 89.7 88.4 88.0  PLT 198 194  164   Basic Metabolic Panel: Recent Labs  Lab 04/16/23 1235 04/17/23 0433 04/18/23 1005 04/18/23 1356 04/19/23 0358  NA 133* 138 134* 134* 136  K 4.0 4.2 3.4* 3.8 3.8  CL 96* 102 100 103 103  CO2 25 27 24 24 24   GLUCOSE 111* 111* 137* 90 101*  BUN 78* 75* 75* 69* 73*  CREATININE 6.17* 5.73* 5.82* 5.46* 5.02*  CALCIUM 14.5* 12.9* 11.6* 11.4* 11.2*  MG 3.9* 3.6*  --   --  2.6*  PHOS 5.6*  --   --  4.7* 4.3   GFR: Estimated Creatinine Clearance: 8.4 mL/min (A) (by C-G formula based on SCr of 5.02 mg/dL (H)). Liver Function Tests: Recent Labs  Lab 04/16/23 1235 04/18/23 1356 04/19/23 0358  AST 24  --   --   ALT 20  --   --   ALKPHOS 58  --   --   BILITOT 0.8  --   --   PROT 8.0  --   --   ALBUMIN 4.1 3.3* 3.2*  3.3*   No results for input(s): "LIPASE", "AMYLASE" in the last 168 hours. No results for input(s): "AMMONIA" in the last 168 hours. Coagulation Profile: No results for input(s): "INR", "PROTIME" in the last 168 hours. Cardiac Enzymes: No results for input(s): "CKTOTAL", "CKMB", "CKMBINDEX", "TROPONINI" in the last 168 hours. BNP (last 3 results) No results for  input(s): "PROBNP" in the last 8760 hours. HbA1C: No results for input(s): "HGBA1C" in the last 72 hours. CBG: No results for input(s): "GLUCAP" in the last 168 hours. Lipid Profile: No results for input(s): "CHOL", "HDL", "LDLCALC", "TRIG", "CHOLHDL", "LDLDIRECT" in the last 72 hours. Thyroid Function Tests: Recent Labs    04/18/23 1356  TSH 1.325   Anemia Panel: No results for input(s): "VITAMINB12", "FOLATE", "FERRITIN", "TIBC", "IRON", "RETICCTPCT" in the last 72 hours. Sepsis Labs: No results for input(s): "PROCALCITON", "LATICACIDVEN" in the last 168 hours.  Recent Results (from the past 240 hour(s))  SARS Coronavirus 2 by RT PCR (hospital order, performed in [facility] hospital lab) *cepheid single result test* Anterior Nasal Swab     Status: None   Collection Time: 04/16/23  2:10 PM    Specimen: Anterior Nasal Swab  Result Value Ref Range Status   SARS Coronavirus 2 by RT PCR NEGATIVE NEGATIVE Final    Comment: (NOTE) SARS-CoV-2 target nucleic acids are NOT DETECTED.  The SARS-CoV-2 RNA is generally detectable in upper and lower respiratory specimens during the acute phase of infection. The lowest concentration of SARS-CoV-2 viral copies this assay can detect is 250 copies / mL. A negative result does not preclude SARS-CoV-2 infection and should not be used as the sole basis for treatment or other patient management decisions.  A negative result may occur with improper specimen collection / handling, submission of specimen other than nasopharyngeal swab, presence of viral mutation(s) within the areas targeted by this assay, and inadequate number of viral copies (<250 copies / mL). A negative result must be combined with clinical observations, patient history, and epidemiological information.  Fact Sheet for Patients:   RoadLapTop.co.za  Fact Sheet for Healthcare Providers: http://kim-miller.com/  This test is not yet approved or  cleared by the [place] FDA and has been authorized for detection and/or diagnosis of SARS-CoV-2 by FDA under an Emergency Use Authorization (EUA).  This EUA will remain in effect (meaning this test can be used) for the duration of the COVID-19 declaration under Section 564(b)(1) of the Act, 21 U.S.C. section 360bbb-3(b)(1), unless the authorization is terminated or revoked sooner.  Performed at [facility], [address] [place], [place] [address]   Respiratory (~20 pathogens) panel by PCR     Status: None   Collection Time: 04/18/23  1:27 PM   Specimen: Nasopharyngeal Swab; Respiratory  Result Value Ref Range Status   Adenovirus NOT DETECTED NOT DETECTED Final   Coronavirus 229E NOT DETECTED NOT DETECTED Final    Comment: (NOTE) The Coronavirus on the Respiratory  Panel, DOES NOT test for the novel  Coronavirus (2019 nCoV)    Coronavirus HKU1 NOT DETECTED NOT DETECTED Final   Coronavirus NL63 NOT DETECTED NOT DETECTED Final   Coronavirus OC43 NOT DETECTED NOT DETECTED Final   Metapneumovirus NOT DETECTED NOT DETECTED Final   Rhinovirus / Enterovirus NOT DETECTED NOT DETECTED Final   Influenza A NOT DETECTED NOT DETECTED Final   Influenza B NOT DETECTED NOT DETECTED Final   Parainfluenza Virus 1 NOT DETECTED NOT DETECTED Final   Parainfluenza Virus 2 NOT DETECTED NOT DETECTED Final   Parainfluenza Virus 3 NOT DETECTED NOT DETECTED Final   Parainfluenza Virus 4 NOT DETECTED NOT DETECTED Final   Respiratory Syncytial Virus NOT DETECTED NOT DETECTED Final   Bordetella pertussis NOT DETECTED NOT DETECTED Final   Bordetella Parapertussis NOT DETECTED NOT DETECTED Final   Chlamydophila pneumoniae NOT DETECTED NOT DETECTED Final   Mycoplasma  pneumoniae NOT DETECTED NOT DETECTED Final    Comment: Performed at [facility], [address] [place], [place] [address]         Radiology Studies: CT CHEST WO CONTRAST  Result Date: 04/18/2023 CLINICAL DATA:  Cough EXAM: CT CHEST WITHOUT CONTRAST TECHNIQUE: Multidetector CT imaging of the chest was performed following the standard protocol without IV contrast. RADIATION DOSE REDUCTION: This exam was performed according to the departmental dose-optimization program which includes automated exposure control, adjustment of the mA and/or kV according to patient size and/or use of iterative reconstruction technique. COMPARISON:  CT 06/10/2022, chest x-ray 04/16/2023, renal ultrasound 02/16/2021 FINDINGS: Cardiovascular: Limited evaluation without intravenous contrast. Moderate aortic atherosclerosis. No aneurysm. Cardiomegaly. Coronary vascular calcification. No pericardial effusion Mediastinum/Nodes: Midline trachea. No thyroid mass. No suspicious mediastinal lymph nodes. Esophagus within normal limits.  Lungs/Pleura: Bandlike densities within the bilateral lower lobes felt consistent with subsegmental atelectasis. No pleural effusion or pneumothorax. Upper Abdomen: No acute finding. Partially visualized right renal cysts, no specific imaging follow-up recommended. Musculoskeletal: No acute or suspicious osseous abnormality. IMPRESSION: 1. Bandlike densities within the bilateral lower lobes felt consistent with subsegmental atelectasis. 2. Cardiomegaly. 3. Aortic atherosclerosis. Aortic Atherosclerosis (ICD10-I70.0). Electronically Signed   By: Jasmine Pang M.D.   On: 04/18/2023 17:41        Scheduled Meds:  amLODipine  5 mg Oral Daily   azelastine  1 spray Each Nare BID   azithromycin  250 mg Oral Daily   feeding supplement  237 mL Oral BID BM   heparin  5,000 Units Subcutaneous Q8H   hydrALAZINE  100 mg Oral BID   levothyroxine  150 mcg Oral Daily   metoprolol tartrate  50 mg Oral BID   mometasone-formoterol  2 puff Inhalation BID   montelukast  10 mg Oral QHS   multivitamin with minerals  1 tablet Oral Daily   pantoprazole  40 mg Oral Daily   rosuvastatin  40 mg Oral Daily   sertraline  100 mg Oral Daily   Continuous Infusions:  sodium chloride 50 mL/hr at 04/18/23 1413   cefTRIAXone (ROCEPHIN)  IV 1 g (04/18/23 1432)     LOS: 2 days    Silvano Bilis, MD [facility]   If 7PM-7AM, please contact night-coverage  04/19/2023, 12:03 PM

## 2023-04-19 NOTE — Progress Notes (Signed)
Occupational Therapy Treatment Patient Details Name: [name] MRN: [id number] DOB: 01-15-1941 Today's Date: 04/19/2023   History of present illness [name] is a 82 y.o. female with medical history significant of CKD-4, HTN, HLD, COPD, dCHF, hypothyroidism, OSA on CPAP, depression, who presents with weakness.   Patient was recently hospitalized from 6/21 - 6/25 due to hypocalcemia and hypomagnesemia, which were repleted and normalized prior to discharge.   OT comments  Pt seen for OT tx. Pt politely declined OOB 2/2 fatigue and recently up. OT facilitated problem solving and education in ECS with home/routines modifications, activity pacing, use of rest breaks, and AE/DME for bathing, dressing, toileting, and grooming tasks. Pt has a shower chair she uses, has a reacher but did not previously know how to use for Lb dressing. Pt verbalized understanding. Continues to benefit. Based on progress with therapy, updated recommendations. Notified TOC.    Recommendations for follow up therapy are one component of a multi-disciplinary discharge planning process, led by the attending physician.  Recommendations may be updated based on patient status, additional functional criteria and insurance authorization.    Assistance Recommended at Discharge Intermittent Supervision/Assistance  Patient can return home with the following  A little help with walking and/or transfers;A little help with bathing/dressing/bathroom;Assistance with cooking/housework;Direct supervision/assist for medications management;Direct supervision/assist for financial management;Help with stairs or ramp for entrance;Assist for transportation   Equipment Recommendations  None recommended by OT    Recommendations for Other Services      Precautions / Restrictions Precautions Precautions: Fall Restrictions Weight Bearing Restrictions: No       Mobility Bed Mobility               General bed mobility comments:  declined 2/2 fatigue and recently up    Transfers                         Balance                                           ADL either performed or assessed with clinical judgement   ADL                                              Extremity/Trunk Assessment Upper Extremity Assessment Upper Extremity Assessment: Overall WFL for tasks assessed;Generalized weakness   Lower Extremity Assessment Lower Extremity Assessment: Generalized weakness        Vision       Perception     Praxis      Cognition Arousal/Alertness: Awake/alert Behavior During Therapy: WFL for tasks assessed/performed Overall Cognitive Status: Within Functional Limits for tasks assessed                                          Exercises Other Exercises Other Exercises: Pt educated in ECS with home/routines modifications, activity pacing, use of rest breaks, and AE/DME for ADL tasks.    Shoulder Instructions       General Comments      Pertinent Vitals/ Pain       Pain Assessment Pain Assessment: No/denies pain  Home Living Family/patient expects  to be discharged to:: Private residence Living Arrangements: Spouse/significant other;Other relatives Available Help at Discharge: Family Type of Home: House Home Access: Stairs to enter [employer] of Steps: 1 Entrance Stairs-Rails: None Home Layout: Two level;Laundry or work area in basement;Able to live on main level with bedroom/bathroom Alternate Teacher, music of Steps: Flight Alternate Level Stairs-Rails: Right Bathroom Shower/Tub: Chief Strategy Officer: Standard     Home Equipment: Agricultural consultant (2 wheels);BSC/3in1;Tub bench;Grab bars - tub/shower;Wheelchair - manual          Prior Functioning/Environment              Frequency  Min 1X/week        Progress Toward Goals  OT Goals(current goals can now be found in the care  plan section)  Progress towards OT goals: Progressing toward goals  Acute Rehab OT Goals OT Goal Formulation: With patient Time For Goal Achievement: 05/01/23 Potential to Achieve Goals: Good  Plan Discharge plan needs to be updated;Frequency remains appropriate    Co-evaluation                 AM-PAC OT "6 Clicks" Daily Activity     Outcome Measure   Help from another person eating meals?: None Help from another person taking care of personal grooming?: A Little Help from another person toileting, which includes using toliet, bedpan, or urinal?: A Little Help from another person bathing (including washing, rinsing, drying)?: A Little Help from another person to put on and taking off regular upper body clothing?: A Little Help from another person to put on and taking off regular lower body clothing?: A Little 6 Click Score: 19    End of Session    OT Visit Diagnosis: Unsteadiness on feet (R26.81);Muscle weakness (generalized) (M62.81);Other abnormalities of gait and mobility (R26.89)   Activity Tolerance Patient limited by fatigue   Patient Left in bed;with call bell/phone within reach;with bed alarm set   Nurse Communication          Time: 1458-1510 OT Time Calculation (min): 12 min  Charges: OT General Charges $OT Visit: 1 Visit OT Treatments $Self Care/Home Management : 8-22 mins  Arman Filter., MPH, MS, OTR/L ascom [phone] 04/19/23, 3:16 PM

## 2023-04-19 NOTE — Evaluation (Signed)
Physical Therapy Evaluation Patient Details Name: [name] MRN: [id number] DOB: 09-24-1941 Today's Date: 04/19/2023  History of Present Illness  [name] is a 82 y.o. female with medical history significant of CKD-4, HTN, HLD, COPD, dCHF, hypothyroidism, OSA on CPAP, depression, who presents with weakness.   Patient was recently hospitalized from 6/21 - 6/25 due to hypocalcemia and hypomagnesemia, which were repleted and normalized prior to discharge.  Clinical Impression  Pt supine in bed a start of session looking better than author had seen her previously. Patient stated she was feeling better and that she wasn't at tired. Still apprehensive to walk due to weakness after admission. Supine<>sit supervision with increased time needed for completion. Step pivot min guard to bsc , had a small BM and requested max A for hygiene. Pt stated she couldn't reach to wipe. STS from bsc w/ RW min guard. Ambulated 60 ft to end of hallway with shuffle gait pattern, decreased step/stride and narrow BOS.  Pt has deficits with strength, balance, mobility, ROM,and endurance. Returned to bed supervision, left with call bell and alarm set. Pt will continue to receive skilled PT services while admitted and will defer to TOC/care team for updates regarding disposition planning.       Assistance Recommended at Discharge Set up Supervision/Assistance  If plan is discharge home, recommend the following:  Can travel by private vehicle  A little help with walking and/or transfers;A little help with bathing/dressing/bathroom;Assistance with cooking/housework;Assist for transportation;Help with stairs or ramp for entrance        Equipment Recommendations None recommended by PT  Recommendations for Other Services       Functional Status Assessment Patient has had a recent decline in their functional status and demonstrates the ability to make significant improvements in function in a reasonable and  predictable amount of time.     Precautions / Restrictions Precautions Precautions: Fall Restrictions Weight Bearing Restrictions: No      Mobility  Bed Mobility Overal bed mobility: Needs Assistance Bed Mobility: Supine to Sit, Sit to Supine     Supine to sit: Supervision, HOB elevated Sit to supine: Supervision, HOB elevated   General bed mobility comments: Increased time needed for movement.    Transfers Overall transfer level: Needs assistance Equipment used: Rolling walker (2 wheels), None Transfers: Sit to/from Stand, Bed to chair/wheelchair/BSC Sit to Stand: Min guard   Step pivot transfers: Min guard       General transfer comment: Min guard STS from bsc with RW. min guard no AD during step pivot to bsc.    Ambulation/Gait Ambulation/Gait assistance: Min guard Gait Distance (Feet): 60 Feet Assistive device: Rolling walker (2 wheels) Gait Pattern/deviations: Decreased step length - right, Decreased step length - left, Decreased stride length, Shuffle, Narrow base of support       General Gait Details: decreased ambulation speed per patient admission. Shuffling with decreased step length/stride.  Stairs            Wheelchair Mobility     Tilt Bed    Modified Rankin (Stroke Patients Only)       Balance Overall balance assessment: Needs assistance Sitting-balance support: Feet supported Sitting balance-Leahy Scale: Good     Standing balance support: Single extremity supported, Bilateral upper extremity supported, During functional activity Standing balance-Leahy Scale: Fair                               Pertinent  Vitals/Pain Pain Assessment Pain Assessment: No/denies pain    Home Living Family/patient expects to be discharged to:: Private residence Living Arrangements: Spouse/significant other;Other relatives Available Help at Discharge: Family Type of Home: House Home Access: Stairs to enter Entrance Stairs-Rails:  None Entrance Stairs-Number of Steps: 1 Alternate Level Stairs-Number of Steps: Flight Home Layout: Two level;Laundry or work area in basement;Able to live on main level with bedroom/bathroom Home Equipment: Agricultural consultant (2 wheels);BSC/3in1;Tub bench;Grab bars - tub/shower;Wheelchair - manual      Prior Function Prior Level of Function : Independent/Modified Independent;Driving             Mobility Comments: uses RW ADLs Comments: pt reports she drives, manages meds and preps meals. assists husband who has dementia     Hand Dominance        Extremity/Trunk Assessment   Upper Extremity Assessment Upper Extremity Assessment: Overall WFL for tasks assessed;Generalized weakness    Lower Extremity Assessment Lower Extremity Assessment: Generalized weakness       Communication   Communication: No difficulties  Cognition Arousal/Alertness: Awake/alert Behavior During Therapy: WFL for tasks assessed/performed Overall Cognitive Status: Within Functional Limits for tasks assessed                                 General Comments: Aox4        General Comments      Exercises Other Exercises Other Exercises: Max a for hygeine post BM. patient stated she could not reach to wipe.   Assessment/Plan    PT Assessment Patient needs continued PT services  PT Problem List Decreased range of motion;Decreased activity tolerance;Decreased strength;Decreased balance;Decreased mobility       PT Treatment Interventions DME instruction;Gait training;Stair training;Functional mobility training;Therapeutic exercise;Therapeutic activities;Balance training;Patient/family education    PT Goals (Current goals can be found in the Care Plan section)  Acute Rehab PT Goals Patient Stated Goal: To get stronger PT Goal Formulation: With patient Time For Goal Achievement: 05/03/23 Potential to Achieve Goals: Good    Frequency Min 1X/week     Co-evaluation                AM-PAC PT "6 Clicks" Mobility  Outcome Measure Help needed turning from your back to your side while in a flat bed without using bedrails?: A Little Help needed moving from lying on your back to sitting on the side of a flat bed without using bedrails?: A Little Help needed moving to and from a bed to a chair (including a wheelchair)?: A Little Help needed standing up from a chair using your arms (e.g., wheelchair or bedside chair)?: A Little Help needed to walk in hospital room?: A Little Help needed climbing 3-5 steps with a railing? : A Little 6 Click Score: 18    End of Session Equipment Utilized During Treatment: Gait belt;Oxygen Activity Tolerance: Patient tolerated treatment well Patient left: in bed;with call bell/phone within reach;with bed alarm set Nurse Communication: Mobility status PT Visit Diagnosis: Difficulty in walking, not elsewhere classified (R26.2);Unsteadiness on feet (R26.81);Other abnormalities of gait and mobility (R26.89);Muscle weakness (generalized) (M62.81)    Time: 1610-9604 PT Time Calculation (min) (ACUTE ONLY): 31 min   Charges:                 Malachi Carl, SPT   Malachi Carl 04/19/2023, 1:37 PM

## 2023-04-19 NOTE — Progress Notes (Signed)
*  PRELIMINARY RESULTS* Echocardiogram 2D Echocardiogram has been performed.  [name] 04/19/2023, 4:08 PM

## 2023-04-19 NOTE — Plan of Care (Signed)
  Problem: Skin Integrity: Goal: Risk for impaired skin integrity will decrease Outcome: Progressing   Problem: Pain Managment: Goal: General experience of comfort will improve Outcome: Progressing   Problem: Elimination: Goal: Will not experience complications related to bowel motility Outcome: Progressing   Problem: Coping: Goal: Level of anxiety will decrease Outcome: Progressing  Pt stable during the shift and focus on effective breathing; safety maintained and no harmful event occurred.

## 2023-04-20 DIAGNOSIS — E878 Other disorders of electrolyte and fluid balance, not elsewhere classified: Secondary | ICD-10-CM | POA: Diagnosis not present

## 2023-04-20 LAB — CBC
HCT: 31.1 % — ABNORMAL LOW (ref 36.0–46.0)
Hemoglobin: 9.8 g/dL — ABNORMAL LOW (ref 12.0–15.0)
MCH: 27.6 pg (ref 26.0–34.0)
MCHC: 31.5 g/dL (ref 30.0–36.0)
MCV: 87.6 fL (ref 80.0–100.0)
Platelets: 157 10*3/uL (ref 150–400)
RBC: 3.55 MIL/uL — ABNORMAL LOW (ref 3.87–5.11)
RDW: 13.2 % (ref 11.5–15.5)
WBC: 7.1 10*3/uL (ref 4.0–10.5)
nRBC: 0 % (ref 0.0–0.2)

## 2023-04-20 LAB — RENAL FUNCTION PANEL
Albumin: 3.3 g/dL — ABNORMAL LOW (ref 3.5–5.0)
Anion gap: 10 (ref 5–15)
BUN: 66 mg/dL — ABNORMAL HIGH (ref 8–23)
CO2: 23 mmol/L (ref 22–32)
Calcium: 11.2 mg/dL — ABNORMAL HIGH (ref 8.9–10.3)
Chloride: 107 mmol/L (ref 98–111)
Creatinine, Ser: 4.04 mg/dL — ABNORMAL HIGH (ref 0.44–1.00)
GFR, Estimated: 11 mL/min — ABNORMAL LOW (ref >60)
Glucose, Bld: 142 mg/dL — ABNORMAL HIGH (ref 70–99)
Phosphorus: 3.3 mg/dL (ref 2.5–4.6)
Potassium: 3.5 mmol/L (ref 3.5–5.1)
Sodium: 140 mmol/L (ref 135–145)

## 2023-04-20 LAB — MAGNESIUM: Magnesium: 2 mg/dL (ref 1.7–2.4)

## 2023-04-20 LAB — LEGIONELLA PNEUMOPHILA SEROGP 1 UR AG: L. pneumophila Serogp 1 Ur Ag: NEGATIVE

## 2023-04-20 LAB — ALBUMIN: Albumin: 3.3 g/dL — ABNORMAL LOW (ref 3.5–5.0)

## 2023-04-20 LAB — CULTURE, RESPIRATORY W GRAM STAIN

## 2023-04-20 MED ORDER — SODIUM CHLORIDE 0.9 % IV SOLN: INTRAVENOUS | Status: DC

## 2023-04-20 MED ORDER — HYDRALAZINE HCL 50 MG PO TABS
100.0000 mg | ORAL_TABLET | Freq: Three times a day (TID) | ORAL | Status: DC
  Administered 2023-04-20 – 2023-04-22 (×6): 100 mg via ORAL
  Filled 2023-04-20 (×6): qty 2

## 2023-04-20 MED ORDER — POTASSIUM CHLORIDE CRYS ER 20 MEQ PO TBCR
40.0000 meq | EXTENDED_RELEASE_TABLET | Freq: Once | ORAL | Status: AC
  Administered 2023-04-20: 40 meq via ORAL
  Filled 2023-04-20: qty 2

## 2023-04-20 NOTE — Progress Notes (Signed)
Physical Therapy Treatment Patient Details Name: [name] MRN: [id number] DOB: 1941/02/19 Today's Date: 04/20/2023   History of Present Illness [name] is a 82 y.o. female with medical history significant of CKD-4, HTN, HLD, COPD, dCHF, hypothyroidism, OSA on CPAP, depression, who presents with weakness.   Patient was recently hospitalized from 6/21 - 6/25 due to hypocalcemia and hypomagnesemia, which were repleted and normalized prior to discharge.    PT Comments  Received pt on [facility] after Mobility Specialist assisted her OOB. Pt had a large BM with assist provided for hygiene. Good tolerance for gait training with RW (baseline LOF) ~169ft with Supervision, slight fatigue, no LOB or SOB, SpO2 98% upon returning to room. Pt educated on B LE strengthening exercises with good understanding. All needs in reach. Will continue to benefit from PT acutely. Continue per POC.    Assistance Recommended at Discharge Set up Supervision/Assistance  If plan is discharge home, recommend the following:  Can travel by private vehicle    A little help with walking and/or transfers;A little help with bathing/dressing/bathroom;Assistance with cooking/housework;Assist for transportation;Help with stairs or ramp for entrance      Equipment Recommendations  None recommended by PT (Pt has a RW at home)    Recommendations for Other Services       Precautions / Restrictions Precautions Precautions: Fall Restrictions Weight Bearing Restrictions: No     Mobility  Bed Mobility               General bed mobility comments: Not assessed this session    Transfers Overall transfer level: Needs assistance Equipment used: Rolling walker (2 wheels), None Transfers: Sit to/from Stand, Bed to chair/wheelchair/BSC Sit to Stand: Supervision Stand pivot transfers: Supervision Step pivot transfers: Supervision       General transfer comment:  (Good safety awarness)     Ambulation/Gait Ambulation/Gait assistance: Supervision, Min guard Gait Distance (Feet): 160 Feet Assistive device: Rolling walker (2 wheels) Gait Pattern/deviations: Decreased step length - right, Decreased step length - left, Decreased stride length, Shuffle, Narrow base of support Gait velocity: decreased     General Gait Details:  (Goof tolerance, slight fatigue, SpO2 98% on RA after ambulating)   Stairs             Wheelchair Mobility     Tilt Bed    Modified Rankin (Stroke Patients Only)       Balance Overall balance assessment: Needs assistance Sitting-balance support: Feet supported Sitting balance-Leahy Scale: Good     Standing balance support: Bilateral upper extremity supported, During functional activity, Reliant on assistive device for balance Standing balance-Leahy Scale: Fair                              Cognition Arousal/Alertness: Awake/alert Behavior During Therapy: WFL for tasks assessed/performed Overall Cognitive Status: Within Functional Limits for tasks assessed                                 General Comments: Aox4        Exercises General Exercises - Lower Extremity Ankle Circles/Pumps: AROM, Both, 10 reps, Seated Long Arc Quad: AROM, Both, 10 reps, Seated Hip Flexion/Marching: AROM, Both, 10 reps, Seated    General Comments General comments (skin integrity, edema, etc.): Pt educated on role of PT, remaining functional goals, and seated exercises with good understanding  Pertinent Vitals/Pain Pain Assessment Pain Assessment: No/denies pain    Home Living                          Prior Function            PT Goals (current goals can now be found in the care plan section) Acute Rehab PT Goals Patient Stated Goal: To get stronger Progress towards PT goals: Progressing toward goals    Frequency    Min 1X/week      PT Plan      Co-evaluation              AM-PAC  PT "6 Clicks" Mobility   Outcome Measure  Help needed turning from your back to your side while in a flat bed without using bedrails?: A Little Help needed moving from lying on your back to sitting on the side of a flat bed without using bedrails?: A Little Help needed moving to and from a bed to a chair (including a wheelchair)?: A Little Help needed standing up from a chair using your arms (e.g., wheelchair or bedside chair)?: A Little Help needed to walk in hospital room?: A Little Help needed climbing 3-5 steps with a railing? : A Little 6 Click Score: 18    End of Session Equipment Utilized During Treatment: Gait belt Activity Tolerance: Patient tolerated treatment well Patient left: in chair;with call bell/phone within reach;with chair alarm set Nurse Communication: Mobility status PT Visit Diagnosis: Difficulty in walking, not elsewhere classified (R26.2);Unsteadiness on feet (R26.81);Other abnormalities of gait and mobility (R26.89);Muscle weakness (generalized) (M62.81)     Time: 4098-1191 PT Time Calculation (min) (ACUTE ONLY): 36 min  Charges:    $Gait Training: 8-22 mins $Therapeutic Exercise: 8-22 mins PT General Charges $$ ACUTE PT VISIT: 1 Visit                    Zadie Cleverly, PTA  Jannet Askew 04/20/2023, 3:32 PM

## 2023-04-20 NOTE — Plan of Care (Signed)
  Problem: Coping: Goal: Level of anxiety will decrease Outcome: Progressing   Problem: Elimination: Goal: Will not experience complications related to bowel motility Outcome: Progressing   Problem: Pain Managment: Goal: General experience of comfort will improve Outcome: Progressing   Problem: Skin Integrity: Goal: Risk for impaired skin integrity will decrease Outcome: Progressing  Pt stable during the shift and focus on effective breathing; safety maintained and no harmful event occurred.

## 2023-04-20 NOTE — Plan of Care (Signed)

## 2023-04-20 NOTE — Progress Notes (Signed)
Mobility Specialist - Progress Note   04/20/23 1100  Mobility  Activity Ambulated with assistance in room;Transferred to/from [facility]  Level of Assistance Standby assist, set-up cues, supervision of patient - no hands on  Assistive Device Front wheel walker  Distance Ambulated (ft) 12 ft  Activity Response Tolerated well  $Mobility charge 1 Mobility     Pt lying in bed upon arrival, utilizing RA. O2 97% at rest. Pt agreeable to activity with min encouragement. Pt reports soreness in back. Completed bed mobility with independence. Upon standing, pt feels urge to have BM and ambulated to [facility] with supervision. PT entered upon exit.    Filiberto Pinks Mobility Specialist 04/20/23, 11:51 AM

## 2023-04-20 NOTE — Progress Notes (Signed)
[facility]  ROUNDING NOTE   Subjective:   [name] was admitted to [facility] on 04/16/2023 for Hypercalcemia [E83.52] Electrolyte abnormality [E87.8] AKI (acute kidney injury) [facility]) [N17.9]  Patient is known to our practice from previous admissions and is followed outpatient by Dr Cherylann Ratel.   Patient seen laying in bed Alert and oriented Reports soreness but states she slept well overngiht  Calcium 11.2 (11.2) (11.4) (12.9) (14.5)  Objective:  Vital signs in last 24 hours:  Temp:  [97.9 F (36.6 C)-98.7 F (37.1 C)] 98.2 F (36.8 C) (07/19 0920) Pulse Rate:  [55-63] 59 (07/19 0920) Resp:  [16] 16 (07/19 0500) BP: (163-174)/(46-59) 172/59 (07/19 0920) SpO2:  [96 %-99 %] 99 % (07/19 0920) FiO2 (%):  [21 %] 21 % (07/18 2105)  Weight change:  Filed Weights   04/16/23 1232 04/17/23 0500 04/19/23 0331  Weight: 73.9 kg 77.4 kg 78.3 kg    Intake/Output: I/O last 3 completed shifts: In: 600 [P.O.:600] Out: 600 [Urine:600]   Intake/Output this shift:  Total I/O In: 240 [P.O.:240] Out: -   Physical Exam: General: Laying in bed  Head: Normocephalic, atraumatic. Dry oral mucosal membranes  Eyes: Anicteric  Lungs:  Diminished at bases  Heart: Regular rate and rhythm  Abdomen:  Soft, nontender  Extremities:  no peripheral edema.  Neurologic: Nonfocal, moving all four extremities  Skin: No lesions, tenting  Access: Left AVF    Basic Metabolic Panel: Recent Labs  Lab 04/16/23 1235 04/17/23 0433 04/18/23 1005 04/18/23 1356 04/19/23 0358 04/20/23 0800  NA 133* 138 134* 134* 136 140  K 4.0 4.2 3.4* 3.8 3.8 3.5  CL 96* 102 100 103 103 107  CO2 25 27 24 24 24 23   GLUCOSE 111* 111* 137* 90 101* 142*  BUN 78* 75* 75* 69* 73* 66*  CREATININE 6.17* 5.73* 5.82* 5.46* 5.02* 4.04*  CALCIUM 14.5* 12.9* 11.6* 11.4* 11.2* 11.2*  MG 3.9* 3.6*  --   --  2.6* 2.0  PHOS 5.6*  --   --  4.7* 4.3 3.3    Liver Function Tests: Recent Labs  Lab 04/16/23 1235  04/18/23 1356 04/19/23 0358 04/20/23 0800  AST 24  --   --   --   ALT 20  --   --   --   ALKPHOS 58  --   --   --   BILITOT 0.8  --   --   --   PROT 8.0  --   --   --   ALBUMIN 4.1 3.3* 3.2*  3.3* 3.3*  3.3*   No results for input(s): "LIPASE", "AMYLASE" in the last 168 hours. No results for input(s): "AMMONIA" in the last 168 hours.  CBC: Recent Labs  Lab 04/16/23 1235 04/17/23 0433 04/19/23 0358 04/20/23 0800  WBC 7.2 7.6 7.8 7.1  HGB 10.6* 9.5* 9.0* 9.8*  HCT 33.2* 29.7* 27.9* 31.1*  MCV 89.7 88.4 88.0 87.6  PLT 198 194 164 157    Cardiac Enzymes: No results for input(s): "CKTOTAL", "CKMB", "CKMBINDEX", "TROPONINI" in the last 168 hours.  BNP: Invalid input(s): "POCBNP"  CBG: No results for input(s): "GLUCAP" in the last 168 hours.  Microbiology: Results for orders placed or performed during the hospital encounter of 04/16/23  SARS Coronavirus 2 by RT PCR (hospital order, performed in [facility] hospital lab) *cepheid single result test* Anterior Nasal Swab     Status: None   Collection Time: 04/16/23  2:10 PM   Specimen: Anterior Nasal  Swab  Result Value Ref Range Status   SARS Coronavirus 2 by RT PCR NEGATIVE NEGATIVE Final    Comment: (NOTE) SARS-CoV-2 target nucleic acids are NOT DETECTED.  The SARS-CoV-2 RNA is generally detectable in upper and lower respiratory specimens during the acute phase of infection. The lowest concentration of SARS-CoV-2 viral copies this assay can detect is 250 copies / mL. A negative result does not preclude SARS-CoV-2 infection and should not be used as the sole basis for treatment or other patient management decisions.  A negative result may occur with improper specimen collection / handling, submission of specimen other than nasopharyngeal swab, presence of viral mutation(s) within the areas targeted by this assay, and inadequate number of viral copies (<250 copies / mL). A negative result must be combined with  clinical observations, patient history, and epidemiological information.  Fact Sheet for Patients:   RoadLapTop.co.za  Fact Sheet for Healthcare Providers: http://kim-miller.com/  This test is not yet approved or  cleared by the [place] FDA and has been authorized for detection and/or diagnosis of SARS-CoV-2 by FDA under an Emergency Use Authorization (EUA).  This EUA will remain in effect (meaning this test can be used) for the duration of the COVID-19 declaration under Section 564(b)(1) of the Act, 21 U.S.C. section 360bbb-3(b)(1), unless the authorization is terminated or revoked sooner.  Performed at [facility], [address] [place], [place] [address]   Respiratory (~20 pathogens) panel by PCR     Status: None   Collection Time: 04/18/23  1:27 PM   Specimen: Nasopharyngeal Swab; Respiratory  Result Value Ref Range Status   Adenovirus NOT DETECTED NOT DETECTED Final   Coronavirus 229E NOT DETECTED NOT DETECTED Final    Comment: (NOTE) The Coronavirus on the Respiratory Panel, DOES NOT test for the novel  Coronavirus (2019 nCoV)    Coronavirus HKU1 NOT DETECTED NOT DETECTED Final   Coronavirus NL63 NOT DETECTED NOT DETECTED Final   Coronavirus OC43 NOT DETECTED NOT DETECTED Final   Metapneumovirus NOT DETECTED NOT DETECTED Final   Rhinovirus / Enterovirus NOT DETECTED NOT DETECTED Final   Influenza A NOT DETECTED NOT DETECTED Final   Influenza B NOT DETECTED NOT DETECTED Final   Parainfluenza Virus 1 NOT DETECTED NOT DETECTED Final   Parainfluenza Virus 2 NOT DETECTED NOT DETECTED Final   Parainfluenza Virus 3 NOT DETECTED NOT DETECTED Final   Parainfluenza Virus 4 NOT DETECTED NOT DETECTED Final   Respiratory Syncytial Virus NOT DETECTED NOT DETECTED Final   Bordetella pertussis NOT DETECTED NOT DETECTED Final   Bordetella Parapertussis NOT DETECTED NOT DETECTED Final   Chlamydophila pneumoniae NOT  DETECTED NOT DETECTED Final   Mycoplasma pneumoniae NOT DETECTED NOT DETECTED Final    Comment: Performed at [facility], [address], [place], [place] [address]  Urine Culture (for pregnant, neutropenic or urologic patients or patients with an indwelling urinary catheter)     Status: Abnormal   Collection Time: 04/18/23  1:28 PM   Specimen: Urine, Clean Catch  Result Value Ref Range Status   Specimen Description   Final    URINE, CLEAN CATCH Performed at [facility], [address] [place], [place] [address]    Special Requests   Final    NONE Performed at [facility], [address] [place], [place] [address]    Culture MULTIPLE SPECIES PRESENT, SUGGEST RECOLLECTION (A)  Final   Report Status 04/19/2023 FINAL  Final  Expectorated Sputum Assessment w Gram  Stain, Rflx to Resp Cult     Status: None   Collection Time: 04/19/23  2:00 PM   Specimen: Sputum  Result Value Ref Range Status   Specimen Description SPUTUM  Final   Special Requests NONE  Final   Sputum evaluation   Final    THIS SPECIMEN IS ACCEPTABLE FOR SPUTUM CULTURE Performed at [facility], [address] [place], [place] [address]    Report Status 04/19/2023 FINAL  Final  Culture, Respiratory w Gram Stain     Status: None (Preliminary result)   Collection Time: 04/19/23  2:00 PM   Specimen: SPU  Result Value Ref Range Status   Specimen Description   Final    SPUTUM Performed at [facility], [address], [place], [place] [address]    Special Requests   Final    NONE Reflexed from [id number] Performed at [facility], [address] [place], [place] [address]    Gram Stain   Final    FEW SQUAMOUS EPITHELIAL CELLS PRESENT FEW WBC PRESENT, PREDOMINANTLY MONONUCLEAR MODERATE GRAM POSITIVE COCCI IN PAIRS AND CHAINS MODERATE GRAM NEGATIVE RODS FEW YEAST    Culture   Final    TOO YOUNG TO READ Performed at [facility], [address]  [place], [place] [address]    Report Status PENDING  Incomplete    Coagulation Studies: No results for input(s): "LABPROT", "INR" in the last 72 hours.  Urinalysis: Recent Labs    04/18/23 1327  COLORURINE YELLOW*  LABSPEC 1.010  PHURINE 7.0  GLUCOSEU NEGATIVE  HGBUR SMALL*  BILIRUBINUR NEGATIVE  KETONESUR NEGATIVE  PROTEINUR 100*  NITRITE NEGATIVE  LEUKOCYTESUR LARGE*      Imaging: ECHOCARDIOGRAM COMPLETE BUBBLE STUDY  Result Date: 04/19/2023    ECHOCARDIOGRAM REPORT   Patient Name:   [name] Date of Exam: 04/19/2023 Medical Rec #:  [id number]     Height:       62.0 in Accession #:    [id number]    Weight:       172.6 lb Date of Birth:  01-18-1941     BSA:          1.796 m Patient Age:    82 years      BP:           162/46 mmHg Patient Gender: F             HR:           59 bpm. Exam Location:  [facility] Procedure: 2D Echo, Cardiac Doppler and Color Doppler Indications:     Atrial Fibrillation  History:         Patient has prior history of Echocardiogram examinations, most                  recent 06/17/2022. CHF, COPD, Arrythmias:Atrial Fibrillation,                  Signs/Symptoms:Dizziness/Lightheadedness and Dyspnea; Risk                  Factors:Hypertension, Diabetes and Dyslipidemia. CKD.  Sonographer:     Mikki Harbor Referring Phys:  [id number] NOAH BEDFORD UVOZ Diagnosing Phys: Debbe Odea MD IMPRESSIONS  1. Left ventricular ejection fraction, by estimation, is 60 to 65%. The left ventricle has normal function. The left ventricle has no regional wall motion abnormalities. There is mild left ventricular hypertrophy. Left ventricular diastolic parameters are indeterminate.  2. Right ventricular systolic function  is normal. The right ventricular size is mildly enlarged. There is mildly elevated pulmonary artery systolic pressure.  3. Left atrial size was severely dilated.  4. Right atrial size was mildly dilated.  5. The mitral valve is normal in structure. Mild mitral valve  regurgitation.  6. The aortic valve is tricuspid. Aortic valve regurgitation is not visualized.  7. The inferior vena cava is normal in size with greater than 50% respiratory variability, suggesting right atrial pressure of 3 mmHg. FINDINGS  Left Ventricle: Left ventricular ejection fraction, by estimation, is 60 to 65%. The left ventricle has normal function. The left ventricle has no regional wall motion abnormalities. The left ventricular internal cavity size was normal in size. There is  mild left ventricular hypertrophy. Left ventricular diastolic parameters are indeterminate. Right Ventricle: The right ventricular size is mildly enlarged. No increase in right ventricular wall thickness. Right ventricular systolic function is normal. There is mildly elevated pulmonary artery systolic pressure. The tricuspid regurgitant velocity is 2.68 m/s, and with an assumed right atrial pressure of 8 mmHg, the estimated right ventricular systolic pressure is 36.7 mmHg. Left Atrium: Left atrial size was severely dilated. Right Atrium: Right atrial size was mildly dilated. Pericardium: There is no evidence of pericardial effusion. Mitral Valve: The mitral valve is normal in structure. Mild mitral valve regurgitation. MV peak gradient, 5.8 mmHg. The mean mitral valve gradient is 2.0 mmHg. Tricuspid Valve: The tricuspid valve is normal in structure. Tricuspid valve regurgitation is mild. Aortic Valve: The aortic valve is tricuspid. Aortic valve regurgitation is not visualized. Aortic valve mean gradient measures 7.0 mmHg. Aortic valve peak gradient measures 14.0 mmHg. Aortic valve area, by VTI measures 2.40 cm. Pulmonic Valve: The pulmonic valve was normal in structure. Pulmonic valve regurgitation is trivial. Aorta: The aortic root is normal in size and structure. Venous: The inferior vena cava is normal in size with greater than 50% respiratory variability, suggesting right atrial pressure of 3 mmHg. IAS/Shunts: No atrial  level shunt detected by color flow Doppler.  LEFT VENTRICLE PLAX 2D LVIDd:         5.70 cm   Diastology LVIDs:         3.10 cm   LV e' medial:    6.85 cm/s LV PW:         1.50 cm   LV E/e' medial:  14.3 LV IVS:        1.70 cm   LV e' lateral:   7.83 cm/s LVOT diam:     1.90 cm   LV E/e' lateral: 12.5 LV SV:         109 LV SV Index:   61 LVOT Area:     2.84 cm  RIGHT VENTRICLE RV Basal diam:  3.95 cm RV Mid diam:    3.40 cm RV S prime:     19.00 cm/s TAPSE (M-mode): 2.6 cm LEFT ATRIUM              Index        RIGHT ATRIUM           Index LA diam:        5.80 cm  3.23 cm/m   RA Area:     21.20 cm LA Vol (A2C):   137.0 ml 76.29 ml/m  RA Volume:   64.80 ml  36.09 ml/m LA Vol (A4C):   103.0 ml 57.36 ml/m LA Biplane Vol: 130.0 ml 72.39 ml/m  AORTIC VALVE  PULMONIC VALVE AV Area (Vmax):    2.26 cm      PV Vmax:       1.35 m/s AV Area (Vmean):   2.38 cm      PV Peak grad:  7.3 mmHg AV Area (VTI):     2.40 cm AV Vmax:           187.00 cm/s AV Vmean:          119.000 cm/s AV VTI:            0.454 m AV Peak Grad:      14.0 mmHg AV Mean Grad:      7.0 mmHg LVOT Vmax:         149.00 cm/s LVOT Vmean:        100.000 cm/s LVOT VTI:          0.384 m LVOT/AV VTI ratio: 0.85  AORTA Ao Root diam: 3.10 cm MITRAL VALVE                TRICUSPID VALVE MV Area (PHT): 3.31 cm     TR Peak grad:   28.7 mmHg MV Area VTI:   2.81 cm     TR Vmax:        268.00 cm/s MV Peak grad:  5.8 mmHg MV Mean grad:  2.0 mmHg     SHUNTS MV Vmax:       1.20 m/s     Systemic VTI:  0.38 m MV Vmean:      59.7 cm/s    Systemic Diam: 1.90 cm MV Decel Time: 229 msec MV E velocity: 98.00 cm/s MV A velocity: 102.00 cm/s MV E/A ratio:  0.96 Debbe Odea MD Electronically signed by Debbe Odea MD Signature Date/Time: 04/19/2023/5:23:04 PM    Final    CT CHEST WO CONTRAST  Result Date: 04/18/2023 CLINICAL DATA:  Cough EXAM: CT CHEST WITHOUT CONTRAST TECHNIQUE: Multidetector CT imaging of the chest was performed following the  standard protocol without IV contrast. RADIATION DOSE REDUCTION: This exam was performed according to the departmental dose-optimization program which includes automated exposure control, adjustment of the mA and/or kV according to patient size and/or use of iterative reconstruction technique. COMPARISON:  CT 06/10/2022, chest x-ray 04/16/2023, renal ultrasound 02/16/2021 FINDINGS: Cardiovascular: Limited evaluation without intravenous contrast. Moderate aortic atherosclerosis. No aneurysm. Cardiomegaly. Coronary vascular calcification. No pericardial effusion Mediastinum/Nodes: Midline trachea. No thyroid mass. No suspicious mediastinal lymph nodes. Esophagus within normal limits. Lungs/Pleura: Bandlike densities within the bilateral lower lobes felt consistent with subsegmental atelectasis. No pleural effusion or pneumothorax. Upper Abdomen: No acute finding. Partially visualized right renal cysts, no specific imaging follow-up recommended. Musculoskeletal: No acute or suspicious osseous abnormality. IMPRESSION: 1. Bandlike densities within the bilateral lower lobes felt consistent with subsegmental atelectasis. 2. Cardiomegaly. 3. Aortic atherosclerosis. Aortic Atherosclerosis (ICD10-I70.0). Electronically Signed   By: Jasmine Pang M.D.   On: 04/18/2023 17:41     Medications:    sodium chloride 75 mL/hr at 04/20/23 1114   cefTRIAXone (ROCEPHIN)  IV 1 g (04/19/23 1348)     amLODipine  10 mg Oral Daily   azelastine  1 spray Each Nare BID   feeding supplement  237 mL Oral BID BM   heparin  5,000 Units Subcutaneous Q8H   hydrALAZINE  100 mg Oral TID   levothyroxine  150 mcg Oral Daily   metoprolol tartrate  50 mg Oral BID   mometasone-formoterol  2 puff Inhalation BID   montelukast  10 mg Oral  QHS   multivitamin with minerals  1 tablet Oral Daily   pantoprazole  40 mg Oral Daily   rosuvastatin  40 mg Oral Daily   sertraline  100 mg Oral Daily   acetaminophen, albuterol, fluticasone,  guaiFENesin, hydrALAZINE, ipratropium-albuterol, loperamide, metoprolol tartrate, ondansetron (ZOFRAN) IV  Assessment/ Plan:  Ms. KESHONNA VALVO is a 82 y.o.  female with diabetes mellitus type II, hypertension, COPD, hypothyroidism, allergies, chronic congestive heart failure, GERD, sleep apnea, hyperlipidemia who is admitted to [facility] on 04/16/2023 for Hypercalcemia [E83.52] Electrolyte abnormality [E87.8] AKI (acute kidney injury) (HCC) [N17.9]  Acute kidney injury on chronic kidney disease stage IV: baseline creatinine of 1.92, GFR of 26 on 03/27/23. Renal US negative for obstruction. No IV contrast exposure. Acute kidney injury likely secondary to infection, UTI, and poor oral intake.  - Creatinine continues to improve - Continue IVF for now  Hypercalcemia:  PTH of 17 on 04/02/23.  - Calcium 14.5 on admission.  - Holding all calcium supplements  - Calcium the same today, 11.2 - Calcium goal below 11 before considering discharge.   Hypertension with chronic kidney disease: home regimen of spironolactone, metoprolol, hydralazine, amlodipine, torsemide.  - Continue holding torsemide . - Blood pressure remains slightly elevated  Anemia of chronic kidney disease: hemoglobin 9.8. Normocytic.   Diabetes mellitus type II with chronic kidney disease: well controlled with hemoglobin A1c of 5.9% on 01/22/23. Diet controlled   LOS: 3 Katelyne Galster 7/19/202412:35 PM

## 2023-04-20 NOTE — Progress Notes (Signed)
PROGRESS NOTE    [name]  NUU:[id number] DOB: 1940-11-10 DOA: 04/16/2023 PCP: Lauro Regulus, MD    Brief Narrative:  82 y.o. female with medical history significant of CKD-4, HTN, HLD, COPD, dCHF, hypothyroidism, OSA on CPAP, depression, who presents with weakness.    Patient was recently hospitalized from 6/21 - 6/25 due to hypocalcemia and hypomagnesemia, which were repleted and normalized prior to discharge. Pt was started on Vitamin D and Calcitriol, and magnesium oxide.  Patient states that she has generalized weakness for almost a week, which which has been progressively worsening.  No unilateral numbness or tinglings in extremities.  No facial droop or slurred speech.  Patient also reports cough with mild shortness of breath, denies chest pain, fever or chills.  She coughs up a lot of clear mucus.  She was seen in urgent care, and had negative PCR for COVID, flu and RSV.  Patient reports nausea, no vomiting, diarrhea or abdominal pain.  7/16: New onset A-fib RVR noted with heart rate 1 10-1 20s on 7/15.  Converted spontaneously after correction of electrolytes.  Assessment & Plan:   Principal Problem:   Electrolyte abnormality Active Problems:   Hypercalcemia   Hypermagnesemia   Acute renal failure superimposed on stage 4 chronic kidney disease (HCC)   CKD (chronic kidney disease) stage 5, GFR less than 15 ml/min (HCC)   COPD (chronic obstructive pulmonary disease) (HCC)   Chronic diastolic CHF (congestive heart failure) (HCC)   HLD (hyperlipidemia)   Essential hypertension   OSA on CPAP   Type 2 diabetes mellitus with obesity (HCC)   AKI (acute kidney injury) (HCC)  Electrolyte abnormality, hypercalcemia and hypermagnesemia Calcium of 14.5, magnesium 3.9, potassium 4.0 on presentation.  Likely due to overcorrection, recent calcitriol.  Mental status normal.  Electrolytes have improved, corrected ca 11.8 today Plan: Nephrology following Hold calcium and vitamin  D supplement Hold magnesium oxide Continue fluids  Debility PT now advising home health, will plan on that at d/c. Orders have been placed (pt/ot/rn/sw)   Acute renal failure superimposed on stage 4 chronic kidney disease [facility]): Renal ultrasound is negative for hydronephrosis.   Urinalysis negative few bacteria. Baseline creatinine 2.  Currently 4, improving from 5s Plan: Holding diuretics Continue gentle hydration Nephrology following  COPD (chronic obstructive pulmonary disease) (HCC) CAP patient has cough with a lot of clear mucus production, denies chest pain has mild shortness of breath. CXR showed Ill-defined opacity within the lateral left lung base.  Patient does not have fever or leukocytosis. Covid negative 7/15. Rvp neg. Dimer neg. Still with sig cough but it's improving. CT with signs atelectasis Plan: Bronchodilator  empiric azithromycin Added ceftriaxone Start incentive spirometry Will check urine antigens and sputum culture. Strep neg, sputum culture pending, legionella pending  Dysuria New 7/17, ua consistent w/ possible infection. Multiple species on culture.  Continuing ceftriaxone as above   Chronic diastolic CHF (congestive heart failure) (HCC): 2D echo 06/19/2022 showed EF of 60 to 65%.  Patient has trace leg edema, no pulmonary on chest x-ray, clinically does not seem to have CHF exacerbation Plan: Diuretics on hold BNP 273.  Not necessarily indicative of decompensated heart failure   HLD (hyperlipidemia) PTA rosuvastatin   Essential hypertension Bp elevated -IV hydralazine as needed -Amlodipine will increase to 10, cont hydralazine but will increase to tid, cont metoprolol consider switch to coreg   OSA - on CPAP nightly  New onset A-fib Noted on telemetry on 7/15.  Max ventricular rate 120.  Resolved spontaneously without intervention after correction of electrolytes.  Last echocardiogram from September 2023.  Normal ejection fraction with no  diastolic dysfunction.  Will maintain on telemetry for now.  Defer cardiology consultation and anticoagulation for now.  If patient goes back into rapid atrial fibrillation consider cardiology evaluation. Has not had recurrence. Tte with mild phtn, severe LA dilation.   DVT prophylaxis: SQ heparin Code Status: Full Family Communication: Daughter theresa updated telephonically 7/18 Remains inpatient appropriate because: continuing iv fluids and monitoring renal recovery     Level of care: Telemetry Medical  Consultants:  Nephrology  Procedures:  None  Antimicrobials: Azithromycin, ceftriaxone   Subjective: Seen and examined.  Ongoing cough but thinks breathing is better. Ambulated with pt earlier today. Weakness improving  Objective: Vitals:   04/19/23 1559 04/19/23 2000 04/20/23 0500 04/20/23 0920  BP: (!) 163/53 (!) 171/55 (!) 174/46 (!) 172/59  Pulse: (!) 57 63 (!) 55 (!) 59  Resp: 16 16 16    Temp: 98.6 F (37 C) 98.7 F (37.1 C) 97.9 F (36.6 C)   TempSrc:  Oral Oral   SpO2: 99% 96% 98%   Weight:      Height:        Intake/Output Summary (Last 24 hours) at 04/20/2023 1053 Last data filed at 04/20/2023 0800 Gross per 24 hour  Intake 720 ml  Output 600 ml  Net 120 ml   Filed Weights   04/16/23 1232 04/17/23 0500 04/19/23 0331  Weight: 73.9 kg 77.4 kg 78.3 kg    Examination:  General exam: NAD.  Appears frail Respiratory system: faint rhonchi. Respiratory effort normal. Cardiovascular system: S1-S2, RRR, no murmurs, trace pedal edema Gastrointestinal system: Soft, NT/ND, normal bowel sounds Central nervous system: Alert and oriented. No focal neurological deficits. Extremities: Symmetrical   power bilaterally Skin: No rashes, lesions or ulcers Psychiatry: Judgement and insight appear normal. Mood & affect appropriate.     Data Reviewed: I have personally reviewed following labs and imaging studies  CBC: Recent Labs  Lab 04/16/23 1235  04/17/23 0433 04/19/23 0358 04/20/23 0800  WBC 7.2 7.6 7.8 7.1  HGB 10.6* 9.5* 9.0* 9.8*  HCT 33.2* 29.7* 27.9* 31.1*  MCV 89.7 88.4 88.0 87.6  PLT 198 194 164 157   Basic Metabolic Panel: Recent Labs  Lab 04/16/23 1235 04/17/23 0433 04/18/23 1005 04/18/23 1356 04/19/23 0358 04/20/23 0800  NA 133* 138 134* 134* 136 140  K 4.0 4.2 3.4* 3.8 3.8 3.5  CL 96* 102 100 103 103 107  CO2 25 27 24 24 24 23   GLUCOSE 111* 111* 137* 90 101* 142*  BUN 78* 75* 75* 69* 73* 66*  CREATININE 6.17* 5.73* 5.82* 5.46* 5.02* 4.04*  CALCIUM 14.5* 12.9* 11.6* 11.4* 11.2* 11.2*  MG 3.9* 3.6*  --   --  2.6*  --   PHOS 5.6*  --   --  4.7* 4.3 3.3   GFR: Estimated Creatinine Clearance: 10.4 mL/min (A) (by C-G formula based on SCr of 4.04 mg/dL (H)). Liver Function Tests: Recent Labs  Lab 04/16/23 1235 04/18/23 1356 04/19/23 0358 04/20/23 0800  AST 24  --   --   --   ALT 20  --   --   --   ALKPHOS 58  --   --   --   BILITOT 0.8  --   --   --   PROT 8.0  --   --   --   ALBUMIN 4.1 3.3* 3.2*  3.3* 3.3*  No results for input(s): "LIPASE", "AMYLASE" in the last 168 hours. No results for input(s): "AMMONIA" in the last 168 hours. Coagulation Profile: No results for input(s): "INR", "PROTIME" in the last 168 hours. Cardiac Enzymes: No results for input(s): "CKTOTAL", "CKMB", "CKMBINDEX", "TROPONINI" in the last 168 hours. BNP (last 3 results) No results for input(s): "PROBNP" in the last 8760 hours. HbA1C: No results for input(s): "HGBA1C" in the last 72 hours. CBG: No results for input(s): "GLUCAP" in the last 168 hours. Lipid Profile: No results for input(s): "CHOL", "HDL", "LDLCALC", "TRIG", "CHOLHDL", "LDLDIRECT" in the last 72 hours. Thyroid Function Tests: Recent Labs    04/18/23 1356  TSH 1.325   Anemia Panel: No results for input(s): "VITAMINB12", "FOLATE", "FERRITIN", "TIBC", "IRON", "RETICCTPCT" in the last 72 hours. Sepsis Labs: No results for input(s): "PROCALCITON",  "LATICACIDVEN" in the last 168 hours.  Recent Results (from the past 240 hour(s))  SARS Coronavirus 2 by RT PCR (hospital order, performed in [facility] hospital lab) *cepheid single result test* Anterior Nasal Swab     Status: None   Collection Time: 04/16/23  2:10 PM   Specimen: Anterior Nasal Swab  Result Value Ref Range Status   SARS Coronavirus 2 by RT PCR NEGATIVE NEGATIVE Final    Comment: (NOTE) SARS-CoV-2 target nucleic acids are NOT DETECTED.  The SARS-CoV-2 RNA is generally detectable in upper and lower respiratory specimens during the acute phase of infection. The lowest concentration of SARS-CoV-2 viral copies this assay can detect is 250 copies / mL. A negative result does not preclude SARS-CoV-2 infection and should not be used as the sole basis for treatment or other patient management decisions.  A negative result may occur with improper specimen collection / handling, submission of specimen other than nasopharyngeal swab, presence of viral mutation(s) within the areas targeted by this assay, and inadequate number of viral copies (<250 copies / mL). A negative result must be combined with clinical observations, patient history, and epidemiological information.  Fact Sheet for Patients:   RoadLapTop.co.za  Fact Sheet for Healthcare Providers: http://kim-miller.com/  This test is not yet approved or  cleared by the [place] FDA and has been authorized for detection and/or diagnosis of SARS-CoV-2 by FDA under an Emergency Use Authorization (EUA).  This EUA will remain in effect (meaning this test can be used) for the duration of the COVID-19 declaration under Section 564(b)(1) of the Act, 21 U.S.C. section 360bbb-3(b)(1), unless the authorization is terminated or revoked sooner.  Performed at [facility], [address] [place], [place] [address]   Respiratory (~20 pathogens) panel by PCR     Status:  None   Collection Time: 04/18/23  1:27 PM   Specimen: Nasopharyngeal Swab; Respiratory  Result Value Ref Range Status   Adenovirus NOT DETECTED NOT DETECTED Final   Coronavirus 229E NOT DETECTED NOT DETECTED Final    Comment: (NOTE) The Coronavirus on the Respiratory Panel, DOES NOT test for the novel  Coronavirus (2019 nCoV)    Coronavirus HKU1 NOT DETECTED NOT DETECTED Final   Coronavirus NL63 NOT DETECTED NOT DETECTED Final   Coronavirus OC43 NOT DETECTED NOT DETECTED Final   Metapneumovirus NOT DETECTED NOT DETECTED Final   Rhinovirus / Enterovirus NOT DETECTED NOT DETECTED Final   Influenza A NOT DETECTED NOT DETECTED Final   Influenza B NOT DETECTED NOT DETECTED Final   Parainfluenza Virus 1 NOT DETECTED NOT DETECTED Final   Parainfluenza Virus 2 NOT DETECTED NOT DETECTED Final   Parainfluenza Virus  3 NOT DETECTED NOT DETECTED Final   Parainfluenza Virus 4 NOT DETECTED NOT DETECTED Final   Respiratory Syncytial Virus NOT DETECTED NOT DETECTED Final   Bordetella pertussis NOT DETECTED NOT DETECTED Final   Bordetella Parapertussis NOT DETECTED NOT DETECTED Final   Chlamydophila pneumoniae NOT DETECTED NOT DETECTED Final   Mycoplasma pneumoniae NOT DETECTED NOT DETECTED Final    Comment: Performed at [facility], [address], [place], [place] [address]  Urine Culture (for pregnant, neutropenic or urologic patients or patients with an indwelling urinary catheter)     Status: Abnormal   Collection Time: 04/18/23  1:28 PM   Specimen: Urine, Clean Catch  Result Value Ref Range Status   Specimen Description   Final    URINE, CLEAN CATCH Performed at [facility], [address] [place], [place] [address]    Special Requests   Final    NONE Performed at [facility], [address] [place], [place] [address]    Culture MULTIPLE SPECIES PRESENT, SUGGEST RECOLLECTION (A)  Final   Report Status 04/19/2023 FINAL  Final  Expectorated Sputum Assessment  w Gram Stain, Rflx to Resp Cult     Status: None   Collection Time: 04/19/23  2:00 PM   Specimen: Sputum  Result Value Ref Range Status   Specimen Description SPUTUM  Final   Special Requests NONE  Final   Sputum evaluation   Final    THIS SPECIMEN IS ACCEPTABLE FOR SPUTUM CULTURE Performed at [facility], [address], [place], [place] [address]    Report Status 04/19/2023 FINAL  Final  Culture, Respiratory w Gram Stain     Status: None (Preliminary result)   Collection Time: 04/19/23  2:00 PM   Specimen: SPU  Result Value Ref Range Status   Specimen Description   Final    SPUTUM Performed at [facility], [address], [place], [place] [address]    Special Requests   Final    NONE Reflexed from [id number] Performed at [facility], [address] [place], [place] [address]    Gram Stain   Final    FEW SQUAMOUS EPITHELIAL CELLS PRESENT FEW WBC PRESENT, PREDOMINANTLY MONONUCLEAR MODERATE GRAM POSITIVE COCCI IN PAIRS AND CHAINS MODERATE GRAM NEGATIVE RODS FEW YEAST Performed at [facility], [address], [place], [place] [address]    Culture PENDING  Incomplete   Report Status PENDING  Incomplete         Radiology Studies: ECHOCARDIOGRAM COMPLETE BUBBLE STUDY  Result Date: 04/19/2023    ECHOCARDIOGRAM REPORT   Patient Name:   RASHEEDAH REIS [facility] Date of Exam: 04/19/2023 Medical Rec #:  [id number]     Height:       62.0 in Accession #:    [id number]    Weight:       172.6 lb Date of Birth:  02-Feb-1941     BSA:          1.796 m Patient Age:    82 years      BP:           162/46 mmHg Patient Gender: F             HR:           59 bpm. Exam Location:  [facility] Procedure: 2D Echo, Cardiac Doppler and Color Doppler Indications:     Atrial Fibrillation  History:         Patient has prior  history of Echocardiogram examinations, most                  recent 06/17/2022. CHF, COPD, Arrythmias:Atrial Fibrillation,                   Signs/Symptoms:Dizziness/Lightheadedness and Dyspnea; Risk                  Factors:Hypertension, Diabetes and Dyslipidemia. CKD.  Sonographer:     Mikki Harbor Referring Phys:  [id number] Chaney Ingram BEDFORD ZHYQ Diagnosing Phys: Debbe Odea MD IMPRESSIONS  1. Left ventricular ejection fraction, by estimation, is 60 to 65%. The left ventricle has normal function. The left ventricle has no regional wall motion abnormalities. There is mild left ventricular hypertrophy. Left ventricular diastolic parameters are indeterminate.  2. Right ventricular systolic function is normal. The right ventricular size is mildly enlarged. There is mildly elevated pulmonary artery systolic pressure.  3. Left atrial size was severely dilated.  4. Right atrial size was mildly dilated.  5. The mitral valve is normal in structure. Mild mitral valve regurgitation.  6. The aortic valve is tricuspid. Aortic valve regurgitation is not visualized.  7. The inferior vena cava is normal in size with greater than 50% respiratory variability, suggesting right atrial pressure of 3 mmHg. FINDINGS  Left Ventricle: Left ventricular ejection fraction, by estimation, is 60 to 65%. The left ventricle has normal function. The left ventricle has no regional wall motion abnormalities. The left ventricular internal cavity size was normal in size. There is  mild left ventricular hypertrophy. Left ventricular diastolic parameters are indeterminate. Right Ventricle: The right ventricular size is mildly enlarged. No increase in right ventricular wall thickness. Right ventricular systolic function is normal. There is mildly elevated pulmonary artery systolic pressure. The tricuspid regurgitant velocity is 2.68 m/s, and with an assumed right atrial pressure of 8 mmHg, the estimated right ventricular systolic pressure is 36.7 mmHg. Left Atrium: Left atrial size was severely dilated. Right Atrium: Right atrial size was mildly dilated. Pericardium: There is no evidence  of pericardial effusion. Mitral Valve: The mitral valve is normal in structure. Mild mitral valve regurgitation. MV peak gradient, 5.8 mmHg. The mean mitral valve gradient is 2.0 mmHg. Tricuspid Valve: The tricuspid valve is normal in structure. Tricuspid valve regurgitation is mild. Aortic Valve: The aortic valve is tricuspid. Aortic valve regurgitation is not visualized. Aortic valve mean gradient measures 7.0 mmHg. Aortic valve peak gradient measures 14.0 mmHg. Aortic valve area, by VTI measures 2.40 cm. Pulmonic Valve: The pulmonic valve was normal in structure. Pulmonic valve regurgitation is trivial. Aorta: The aortic root is normal in size and structure. Venous: The inferior vena cava is normal in size with greater than 50% respiratory variability, suggesting right atrial pressure of 3 mmHg. IAS/Shunts: No atrial level shunt detected by color flow Doppler.  LEFT VENTRICLE PLAX 2D LVIDd:         5.70 cm   Diastology LVIDs:         3.10 cm   LV e' medial:    6.85 cm/s LV PW:         1.50 cm   LV E/e' medial:  14.3 LV IVS:        1.70 cm   LV e' lateral:   7.83 cm/s LVOT diam:     1.90 cm   LV E/e' lateral: 12.5 LV SV:         109 LV SV Index:   61 LVOT Area:  2.84 cm  RIGHT VENTRICLE RV Basal diam:  3.95 cm RV Mid diam:    3.40 cm RV S prime:     19.00 cm/s TAPSE (M-mode): 2.6 cm LEFT ATRIUM              Index        RIGHT ATRIUM           Index LA diam:        5.80 cm  3.23 cm/m   RA Area:     21.20 cm LA Vol (A2C):   137.0 ml 76.29 ml/m  RA Volume:   64.80 ml  36.09 ml/m LA Vol (A4C):   103.0 ml 57.36 ml/m LA Biplane Vol: 130.0 ml 72.39 ml/m  AORTIC VALVE                     PULMONIC VALVE AV Area (Vmax):    2.26 cm      PV Vmax:       1.35 m/s AV Area (Vmean):   2.38 cm      PV Peak grad:  7.3 mmHg AV Area (VTI):     2.40 cm AV Vmax:           187.00 cm/s AV Vmean:          119.000 cm/s AV VTI:            0.454 m AV Peak Grad:      14.0 mmHg AV Mean Grad:      7.0 mmHg LVOT Vmax:         149.00  cm/s LVOT Vmean:        100.000 cm/s LVOT VTI:          0.384 m LVOT/AV VTI ratio: 0.85  AORTA Ao Root diam: 3.10 cm MITRAL VALVE                TRICUSPID VALVE MV Area (PHT): 3.31 cm     TR Peak grad:   28.7 mmHg MV Area VTI:   2.81 cm     TR Vmax:        268.00 cm/s MV Peak grad:  5.8 mmHg MV Mean grad:  2.0 mmHg     SHUNTS MV Vmax:       1.20 m/s     Systemic VTI:  0.38 m MV Vmean:      59.7 cm/s    Systemic Diam: 1.90 cm MV Decel Time: 229 msec MV E velocity: 98.00 cm/s MV A velocity: 102.00 cm/s MV E/A ratio:  0.96 Debbe Odea MD Electronically signed by Debbe Odea MD Signature Date/Time: 04/19/2023/5:23:04 PM    Final    CT CHEST WO CONTRAST  Result Date: 04/18/2023 CLINICAL DATA:  Cough EXAM: CT CHEST WITHOUT CONTRAST TECHNIQUE: Multidetector CT imaging of the chest was performed following the standard protocol without IV contrast. RADIATION DOSE REDUCTION: This exam was performed according to the departmental dose-optimization program which includes automated exposure control, adjustment of the mA and/or kV according to patient size and/or use of iterative reconstruction technique. COMPARISON:  CT 06/10/2022, chest x-ray 04/16/2023, renal ultrasound 02/16/2021 FINDINGS: Cardiovascular: Limited evaluation without intravenous contrast. Moderate aortic atherosclerosis. No aneurysm. Cardiomegaly. Coronary vascular calcification. No pericardial effusion Mediastinum/Nodes: Midline trachea. No thyroid mass. No suspicious mediastinal lymph nodes. Esophagus within normal limits. Lungs/Pleura: Bandlike densities within the bilateral lower lobes felt consistent with subsegmental atelectasis. No pleural effusion or pneumothorax. Upper Abdomen: No acute finding. Partially visualized right renal cysts,  no specific imaging follow-up recommended. Musculoskeletal: No acute or suspicious osseous abnormality. IMPRESSION: 1. Bandlike densities within the bilateral lower lobes felt consistent with subsegmental  atelectasis. 2. Cardiomegaly. 3. Aortic atherosclerosis. Aortic Atherosclerosis (ICD10-I70.0). Electronically Signed   By: Jasmine Pang M.D.   On: 04/18/2023 17:41        Scheduled Meds:  amLODipine  10 mg Oral Daily   azelastine  1 spray Each Nare BID   feeding supplement  237 mL Oral BID BM   heparin  5,000 Units Subcutaneous Q8H   hydrALAZINE  100 mg Oral BID   levothyroxine  150 mcg Oral Daily   metoprolol tartrate  50 mg Oral BID   mometasone-formoterol  2 puff Inhalation BID   montelukast  10 mg Oral QHS   multivitamin with minerals  1 tablet Oral Daily   pantoprazole  40 mg Oral Daily   potassium chloride  40 mEq Oral Once   rosuvastatin  40 mg Oral Daily   sertraline  100 mg Oral Daily   Continuous Infusions:  sodium chloride     cefTRIAXone (ROCEPHIN)  IV 1 g (04/19/23 1348)     LOS: 3 days    Silvano Bilis, MD [facility]   If 7PM-7AM, please contact night-coverage  04/20/2023, 10:53 AM

## 2023-04-20 NOTE — Care Management Important Message (Signed)
Important Message  Patient Details  Name: [name] MRN: [id number] Date of Birth: June 23, 1941   Medicare Important Message Given:  Yes     Johnell Comings 04/20/2023, 12:02 PM

## 2023-04-21 DIAGNOSIS — E878 Other disorders of electrolyte and fluid balance, not elsewhere classified: Secondary | ICD-10-CM | POA: Diagnosis not present

## 2023-04-21 LAB — RENAL FUNCTION PANEL
Albumin: 3.4 g/dL — ABNORMAL LOW (ref 3.5–5.0)
Anion gap: 6 (ref 5–15)
BUN: 63 mg/dL — ABNORMAL HIGH (ref 8–23)
CO2: 22 mmol/L (ref 22–32)
Calcium: 10.5 mg/dL — ABNORMAL HIGH (ref 8.9–10.3)
Chloride: 109 mmol/L (ref 98–111)
Creatinine, Ser: 3.79 mg/dL — ABNORMAL HIGH (ref 0.44–1.00)
GFR, Estimated: 11 mL/min — ABNORMAL LOW (ref >60)
Glucose, Bld: 94 mg/dL (ref 70–99)
Phosphorus: 2.1 mg/dL — ABNORMAL LOW (ref 2.5–4.6)
Potassium: 4 mmol/L (ref 3.5–5.1)
Sodium: 137 mmol/L (ref 135–145)

## 2023-04-21 LAB — CBC
HCT: 26.8 % — ABNORMAL LOW (ref 36.0–46.0)
Hemoglobin: 8.8 g/dL — ABNORMAL LOW (ref 12.0–15.0)
MCH: 28.3 pg (ref 26.0–34.0)
MCHC: 32.8 g/dL (ref 30.0–36.0)
MCV: 86.2 fL (ref 80.0–100.0)
Platelets: 144 10*3/uL — ABNORMAL LOW (ref 150–400)
RBC: 3.11 MIL/uL — ABNORMAL LOW (ref 3.87–5.11)
RDW: 13.3 % (ref 11.5–15.5)
WBC: 7.3 10*3/uL (ref 4.0–10.5)
nRBC: 0 % (ref 0.0–0.2)

## 2023-04-21 LAB — CULTURE, RESPIRATORY W GRAM STAIN

## 2023-04-21 LAB — MAGNESIUM: Magnesium: 1.8 mg/dL (ref 1.7–2.4)

## 2023-04-21 MED ORDER — DEXAMETHASONE 6 MG PO TABS
6.0000 mg | ORAL_TABLET | Freq: Every day | ORAL | Status: DC
  Administered 2023-04-21 – 2023-04-22 (×2): 6 mg via ORAL
  Filled 2023-04-21 (×2): qty 1

## 2023-04-21 NOTE — Progress Notes (Signed)
[facility]  ROUNDING NOTE   Subjective:   Ms. RANDI POULLARD was admitted to [facility] on 04/16/2023 for Hypercalcemia [E83.52] Electrolyte abnormality [E87.8] AKI (acute kidney injury) [facility]) [N17.9]  Patient is known to our practice from previous admissions and is followed outpatient by Dr Cherylann Ratel.   Patient seen getting back in bed from restroom States she didn't sleep well overnight, room was too warm Appetite appropriate  Calcium 10.5 (11.2) (11.2) (11.4) (12.9) (14.5)  Objective:  Vital signs in last 24 hours:  Temp:  [97.9 F (36.6 C)-98.2 F (36.8 C)] 98.2 F (36.8 C) (07/20 0737) Pulse Rate:  [59-64] 62 (07/20 0737) Resp:  [14-19] 19 (07/20 0737) BP: (157-162)/(43-56) 162/56 (07/20 0737) SpO2:  [97 %-100 %] 98 % (07/20 0737) FiO2 (%):  [21 %] 21 % (07/19 2211) Weight:  [78.9 kg] 78.9 kg (07/20 0500)  Weight change:  Filed Weights   04/17/23 0500 04/19/23 0331 04/21/23 0500  Weight: 77.4 kg 78.3 kg 78.9 kg    Intake/Output: I/O last 3 completed shifts: In: 480 [P.O.:480] Out: 200 [Urine:200]   Intake/Output this shift:  Total I/O In: 240 [P.O.:240] Out: -   Physical Exam: General: Sitting at bedside  Head: Normocephalic, atraumatic. Dry oral mucosal membranes  Eyes: Anicteric  Lungs:  Diminished at bases  Heart: Regular rate and rhythm  Abdomen:  Soft, nontender  Extremities:  no peripheral edema.  Neurologic: Nonfocal, moving all four extremities  Skin: No lesions, tenting  Access: Left AVF    Basic Metabolic Panel: Recent Labs  Lab 04/16/23 1235 04/17/23 0433 04/18/23 1005 04/18/23 1356 04/19/23 0358 04/20/23 0800 04/21/23 0425  NA 133* 138 134* 134* 136 140 137  K 4.0 4.2 3.4* 3.8 3.8 3.5 4.0  CL 96* 102 100 103 103 107 109  CO2 25 27 24 24 24 23 22   GLUCOSE 111* 111* 137* 90 101* 142* 94  BUN 78* 75* 75* 69* 73* 66* 63*  CREATININE 6.17* 5.73* 5.82* 5.46* 5.02* 4.04* 3.79*  CALCIUM 14.5* 12.9* 11.6* 11.4* 11.2* 11.2* 10.5*   MG 3.9* 3.6*  --   --  2.6* 2.0 1.8  PHOS 5.6*  --   --  4.7* 4.3 3.3 2.1*    Liver Function Tests: Recent Labs  Lab 04/16/23 1235 04/18/23 1356 04/19/23 0358 04/20/23 0800 04/21/23 0425  AST 24  --   --   --   --   ALT 20  --   --   --   --   ALKPHOS 58  --   --   --   --   BILITOT 0.8  --   --   --   --   PROT 8.0  --   --   --   --   ALBUMIN 4.1 3.3* 3.2*  3.3* 3.3*  3.3* 3.4*   No results for input(s): "LIPASE", "AMYLASE" in the last 168 hours. No results for input(s): "AMMONIA" in the last 168 hours.  CBC: Recent Labs  Lab 04/16/23 1235 04/17/23 0433 04/19/23 0358 04/20/23 0800 04/21/23 0425  WBC 7.2 7.6 7.8 7.1 7.3  HGB 10.6* 9.5* 9.0* 9.8* 8.8*  HCT 33.2* 29.7* 27.9* 31.1* 26.8*  MCV 89.7 88.4 88.0 87.6 86.2  PLT 198 194 164 157 144*    Cardiac Enzymes: No results for input(s): "CKTOTAL", "CKMB", "CKMBINDEX", "TROPONINI" in the last 168 hours.  BNP: Invalid input(s): "POCBNP"  CBG: No results for input(s): "GLUCAP" in the last 168 hours.  Microbiology: Results for orders  placed or performed during the hospital encounter of 04/16/23  SARS Coronavirus 2 by RT PCR (hospital order, performed in [facility] hospital lab) *cepheid single result test* Anterior Nasal Swab     Status: None   Collection Time: 04/16/23  2:10 PM   Specimen: Anterior Nasal Swab  Result Value Ref Range Status   SARS Coronavirus 2 by RT PCR NEGATIVE NEGATIVE Final    Comment: (NOTE) SARS-CoV-2 target nucleic acids are NOT DETECTED.  The SARS-CoV-2 RNA is generally detectable in upper and lower respiratory specimens during the acute phase of infection. The lowest concentration of SARS-CoV-2 viral copies this assay can detect is 250 copies / mL. A negative result does not preclude SARS-CoV-2 infection and should not be used as the sole basis for treatment or other patient management decisions.  A negative result may occur with improper specimen collection / handling, submission  of specimen other than nasopharyngeal swab, presence of viral mutation(s) within the areas targeted by this assay, and inadequate number of viral copies (<250 copies / mL). A negative result must be combined with clinical observations, patient history, and epidemiological information.  Fact Sheet for Patients:   RoadLapTop.co.za  Fact Sheet for Healthcare Providers: http://kim-miller.com/  This test is not yet approved or  cleared by the [place] FDA and has been authorized for detection and/or diagnosis of SARS-CoV-2 by FDA under an Emergency Use Authorization (EUA).  This EUA will remain in effect (meaning this test can be used) for the duration of the COVID-19 declaration under Section 564(b)(1) of the Act, 21 U.S.C. section 360bbb-3(b)(1), unless the authorization is terminated or revoked sooner.  Performed at [facility], [address] [place], [place] [address]   Respiratory (~20 pathogens) panel by PCR     Status: None   Collection Time: 04/18/23  1:27 PM   Specimen: Nasopharyngeal Swab; Respiratory  Result Value Ref Range Status   Adenovirus NOT DETECTED NOT DETECTED Final   Coronavirus 229E NOT DETECTED NOT DETECTED Final    Comment: (NOTE) The Coronavirus on the Respiratory Panel, DOES NOT test for the novel  Coronavirus (2019 nCoV)    Coronavirus HKU1 NOT DETECTED NOT DETECTED Final   Coronavirus NL63 NOT DETECTED NOT DETECTED Final   Coronavirus OC43 NOT DETECTED NOT DETECTED Final   Metapneumovirus NOT DETECTED NOT DETECTED Final   Rhinovirus / Enterovirus NOT DETECTED NOT DETECTED Final   Influenza A NOT DETECTED NOT DETECTED Final   Influenza B NOT DETECTED NOT DETECTED Final   Parainfluenza Virus 1 NOT DETECTED NOT DETECTED Final   Parainfluenza Virus 2 NOT DETECTED NOT DETECTED Final   Parainfluenza Virus 3 NOT DETECTED NOT DETECTED Final   Parainfluenza Virus 4 NOT DETECTED NOT DETECTED Final    Respiratory Syncytial Virus NOT DETECTED NOT DETECTED Final   Bordetella pertussis NOT DETECTED NOT DETECTED Final   Bordetella Parapertussis NOT DETECTED NOT DETECTED Final   Chlamydophila pneumoniae NOT DETECTED NOT DETECTED Final   Mycoplasma pneumoniae NOT DETECTED NOT DETECTED Final    Comment: Performed at [facility], [address], [place], [place] [address]  Urine Culture (for pregnant, neutropenic or urologic patients or patients with an indwelling urinary catheter)     Status: Abnormal   Collection Time: 04/18/23  1:28 PM   Specimen: Urine, Clean Catch  Result Value Ref Range Status   Specimen Description   Final    URINE, CLEAN CATCH Performed at [facility], [address], [place], [place] [address]  Special Requests   Final    NONE Performed at [facility], [address] [place], [place] [address]    Culture MULTIPLE SPECIES PRESENT, SUGGEST RECOLLECTION (A)  Final   Report Status 04/19/2023 FINAL  Final  Expectorated Sputum Assessment w Gram Stain, Rflx to Resp Cult     Status: None   Collection Time: 04/19/23  2:00 PM   Specimen: Sputum  Result Value Ref Range Status   Specimen Description SPUTUM  Final   Special Requests NONE  Final   Sputum evaluation   Final    THIS SPECIMEN IS ACCEPTABLE FOR SPUTUM CULTURE Performed at [facility], [address], [place], [place] [address]    Report Status 04/19/2023 FINAL  Final  Culture, Respiratory w Gram Stain     Status: None (Preliminary result)   Collection Time: 04/19/23  2:00 PM   Specimen: SPU  Result Value Ref Range Status   Specimen Description   Final    SPUTUM Performed at [facility], [address], [place], [place] [address]    Special Requests   Final    NONE Reflexed from [phone] [phone] Performed at [facility], [address] [place], [place] [address]    Gram Stain   Final    FEW SQUAMOUS EPITHELIAL CELLS PRESENT FEW WBC PRESENT,  PREDOMINANTLY MONONUCLEAR MODERATE GRAM POSITIVE COCCI IN PAIRS AND CHAINS MODERATE GRAM NEGATIVE RODS FEW YEAST    Culture   Final    TOO YOUNG TO READ Performed at [facility], [address], [place], [place] [address]    Report Status PENDING  Incomplete    Coagulation Studies: No results for input(s): "LABPROT", "INR" in the last 72 hours.  Urinalysis: Recent Labs    04/18/23 1327  COLORURINE YELLOW*  LABSPEC 1.010  PHURINE 7.0  GLUCOSEU NEGATIVE  HGBUR SMALL*  BILIRUBINUR NEGATIVE  KETONESUR NEGATIVE  PROTEINUR 100*  NITRITE NEGATIVE  LEUKOCYTESUR LARGE*      Imaging: ECHOCARDIOGRAM COMPLETE BUBBLE STUDY  Result Date: 04/19/2023    ECHOCARDIOGRAM REPORT   Patient Name:   MARIANY MACKINTOSH [facility] Date of Exam: 04/19/2023 Medical Rec #:  [id number]     Height:       62.0 in Accession #:    [id number]    Weight:       172.6 lb Date of Birth:  03-11-1941     BSA:          1.796 m Patient Age:    82 years      BP:           162/46 mmHg Patient Gender: F             HR:           59 bpm. Exam Location:  [facility] Procedure: 2D Echo, Cardiac Doppler and Color Doppler Indications:     Atrial Fibrillation  History:         Patient has prior history of Echocardiogram examinations, most                  recent 06/17/2022. CHF, COPD, Arrythmias:Atrial Fibrillation,                  Signs/Symptoms:Dizziness/Lightheadedness and Dyspnea; Risk                  Factors:Hypertension, Diabetes and Dyslipidemia. CKD.  Sonographer:     Mikki Harbor Referring Phys:  [id number] NOAH BEDFORD WOUK Diagnosing Phys: Debbe Odea  MD IMPRESSIONS  1. Left ventricular ejection fraction, by estimation, is 60 to 65%. The left ventricle has normal function. The left ventricle has no regional wall motion abnormalities. There is mild left ventricular hypertrophy. Left ventricular diastolic parameters are indeterminate.  2. Right ventricular systolic function is normal. The right ventricular size is mildly enlarged.  There is mildly elevated pulmonary artery systolic pressure.  3. Left atrial size was severely dilated.  4. Right atrial size was mildly dilated.  5. The mitral valve is normal in structure. Mild mitral valve regurgitation.  6. The aortic valve is tricuspid. Aortic valve regurgitation is not visualized.  7. The inferior vena cava is normal in size with greater than 50% respiratory variability, suggesting right atrial pressure of 3 mmHg. FINDINGS  Left Ventricle: Left ventricular ejection fraction, by estimation, is 60 to 65%. The left ventricle has normal function. The left ventricle has no regional wall motion abnormalities. The left ventricular internal cavity size was normal in size. There is  mild left ventricular hypertrophy. Left ventricular diastolic parameters are indeterminate. Right Ventricle: The right ventricular size is mildly enlarged. No increase in right ventricular wall thickness. Right ventricular systolic function is normal. There is mildly elevated pulmonary artery systolic pressure. The tricuspid regurgitant velocity is 2.68 m/s, and with an assumed right atrial pressure of 8 mmHg, the estimated right ventricular systolic pressure is 36.7 mmHg. Left Atrium: Left atrial size was severely dilated. Right Atrium: Right atrial size was mildly dilated. Pericardium: There is no evidence of pericardial effusion. Mitral Valve: The mitral valve is normal in structure. Mild mitral valve regurgitation. MV peak gradient, 5.8 mmHg. The mean mitral valve gradient is 2.0 mmHg. Tricuspid Valve: The tricuspid valve is normal in structure. Tricuspid valve regurgitation is mild. Aortic Valve: The aortic valve is tricuspid. Aortic valve regurgitation is not visualized. Aortic valve mean gradient measures 7.0 mmHg. Aortic valve peak gradient measures 14.0 mmHg. Aortic valve area, by VTI measures 2.40 cm. Pulmonic Valve: The pulmonic valve was normal in structure. Pulmonic valve regurgitation is trivial. Aorta: The  aortic root is normal in size and structure. Venous: The inferior vena cava is normal in size with greater than 50% respiratory variability, suggesting right atrial pressure of 3 mmHg. IAS/Shunts: No atrial level shunt detected by color flow Doppler.  LEFT VENTRICLE PLAX 2D LVIDd:         5.70 cm   Diastology LVIDs:         3.10 cm   LV e' medial:    6.85 cm/s LV PW:         1.50 cm   LV E/e' medial:  14.3 LV IVS:        1.70 cm   LV e' lateral:   7.83 cm/s LVOT diam:     1.90 cm   LV E/e' lateral: 12.5 LV SV:         109 LV SV Index:   61 LVOT Area:     2.84 cm  RIGHT VENTRICLE RV Basal diam:  3.95 cm RV Mid diam:    3.40 cm RV S prime:     19.00 cm/s TAPSE (M-mode): 2.6 cm LEFT ATRIUM              Index        RIGHT ATRIUM           Index LA diam:        5.80 cm  3.23 cm/m   RA Area:  21.20 cm LA Vol (A2C):   137.0 ml 76.29 ml/m  RA Volume:   64.80 ml  36.09 ml/m LA Vol (A4C):   103.0 ml 57.36 ml/m LA Biplane Vol: 130.0 ml 72.39 ml/m  AORTIC VALVE                     PULMONIC VALVE AV Area (Vmax):    2.26 cm      PV Vmax:       1.35 m/s AV Area (Vmean):   2.38 cm      PV Peak grad:  7.3 mmHg AV Area (VTI):     2.40 cm AV Vmax:           187.00 cm/s AV Vmean:          119.000 cm/s AV VTI:            0.454 m AV Peak Grad:      14.0 mmHg AV Mean Grad:      7.0 mmHg LVOT Vmax:         149.00 cm/s LVOT Vmean:        100.000 cm/s LVOT VTI:          0.384 m LVOT/AV VTI ratio: 0.85  AORTA Ao Root diam: 3.10 cm MITRAL VALVE                TRICUSPID VALVE MV Area (PHT): 3.31 cm     TR Peak grad:   28.7 mmHg MV Area VTI:   2.81 cm     TR Vmax:        268.00 cm/s MV Peak grad:  5.8 mmHg MV Mean grad:  2.0 mmHg     SHUNTS MV Vmax:       1.20 m/s     Systemic VTI:  0.38 m MV Vmean:      59.7 cm/s    Systemic Diam: 1.90 cm MV Decel Time: 229 msec MV E velocity: 98.00 cm/s MV A velocity: 102.00 cm/s MV E/A ratio:  0.96 Debbe Odea MD Electronically signed by Debbe Odea MD Signature Date/Time:  04/19/2023/5:23:04 PM    Final      Medications:    sodium chloride 50 mL/hr at 04/21/23 1115   cefTRIAXone (ROCEPHIN)  IV 1 g (04/20/23 1422)     amLODipine  10 mg Oral Daily   azelastine  1 spray Each Nare BID   feeding supplement  237 mL Oral BID BM   heparin  5,000 Units Subcutaneous Q8H   hydrALAZINE  100 mg Oral TID   levothyroxine  150 mcg Oral Daily   metoprolol tartrate  50 mg Oral BID   mometasone-formoterol  2 puff Inhalation BID   montelukast  10 mg Oral QHS   multivitamin with minerals  1 tablet Oral Daily   pantoprazole  40 mg Oral Daily   rosuvastatin  40 mg Oral Daily   sertraline  100 mg Oral Daily   acetaminophen, albuterol, fluticasone, guaiFENesin, hydrALAZINE, ipratropium-albuterol, loperamide, metoprolol tartrate, ondansetron (ZOFRAN) IV  Assessment/ Plan:  Ms. BEAUTY PLESS is a 82 y.o.  female with diabetes mellitus type II, hypertension, COPD, hypothyroidism, allergies, chronic congestive heart failure, GERD, sleep apnea, hyperlipidemia who is admitted to [facility] on 04/16/2023 for Hypercalcemia [E83.52] Electrolyte abnormality [E87.8] AKI (acute kidney injury) ([facility]) [N17.9]  Acute kidney injury on chronic kidney disease stage IV: baseline creatinine of 1.92, GFR of 26 on 03/27/23. Renal US negative for obstruction. No IV contrast exposure. Acute kidney  injury likely secondary to infection, UTI, and poor oral intake.  - Creatinine improving with IVF - Appetite appropriate, will decrease IVF to NS 60ml/hr - Will monitor creatinine for an additional day  Hypercalcemia:  PTH of 17 on 04/02/23.  - Calcium 14.5 on admission.  - Holding all calcium supplements  - Calcium 10.5, corrected 11.    Hypertension with chronic kidney disease: home regimen of spironolactone, metoprolol, hydralazine, amlodipine, torsemide.  - Continue holding torsemide .  Anemia of chronic kidney disease: hemoglobin 8.8. Normocytic.  Monitoring Hgb, it's just below desired target.    Diabetes mellitus type II with chronic kidney disease: well controlled with hemoglobin A1c of 5.9% on 01/22/23. Diet controlled   LOS: 4 Safira Proffit 7/20/202411:50 AM

## 2023-04-21 NOTE — Plan of Care (Signed)

## 2023-04-21 NOTE — Progress Notes (Signed)
OT Cancellation Note  Patient Details Name: [name] MRN: [id number] DOB: 12-10-1940   Cancelled Treatment:    Reason Eval/Treat Not Completed: Patient declined, no reason specified. Upon attempt, pt declining, requesting to take a nap. Will re-attempt as schedule permits.   Arman Filter., MPH, MS, OTR/L ascom [phone] 04/21/23, 12:56 PM

## 2023-04-21 NOTE — Progress Notes (Addendum)
PROGRESS NOTE    BAILYN [name]  KZS:[id number] DOB: [birth year] DOA: 04/16/2023 PCP: Lauro Regulus, MD    Brief Narrative:  82 y.o. female with medical history significant of CKD-4, HTN, HLD, COPD, dCHF, hypothyroidism, OSA on CPAP, depression, who presents with weakness.    Patient was recently hospitalized from 6/21 - 6/25 due to hypocalcemia and hypomagnesemia, which were repleted and normalized prior to discharge. Pt was started on Vitamin D and Calcitriol, and magnesium oxide.  Patient states that she has generalized weakness for almost a week, which which has been progressively worsening.  No unilateral numbness or tinglings in extremities.  No facial droop or slurred speech.  Patient also reports cough with mild shortness of breath, denies chest pain, fever or chills.  She coughs up a lot of clear mucus.  She was seen in urgent care, and had negative PCR for COVID, flu and RSV.  Patient reports nausea, no vomiting, diarrhea or abdominal pain.  7/16: New onset A-fib RVR noted with heart rate 1 10-1 20s on 7/15.  Converted spontaneously after correction of electrolytes.  Assessment & Plan:   Principal Problem:   Electrolyte abnormality Active Problems:   Hypercalcemia   Hypermagnesemia   Acute renal failure superimposed on stage 4 chronic kidney disease (HCC)   CKD (chronic kidney disease) stage 5, GFR less than 15 ml/min (HCC)   COPD (chronic obstructive pulmonary disease) (HCC)   Chronic diastolic CHF (congestive heart failure) (HCC)   HLD (hyperlipidemia)   Essential hypertension   OSA on CPAP   Type 2 diabetes mellitus with obesity (HCC)   AKI (acute kidney injury) (HCC)  Electrolyte abnormality, hypercalcemia and hypermagnesemia Calcium of 14.5, magnesium 3.9, potassium 4.0 on presentation.  Likely due to overcorrection, recent calcitriol.  Mental status normal.  Electrolytes have improved, corrected ca 11  today Plan: Nephrology following Hold calcium and vitamin D  supplement Hold magnesium oxide Continue gentle fluids Likely d/c tomorrow  Debility PT now advising home health, will plan on that at d/c. Orders have been placed (pt/ot/rn/sw)   Acute renal failure superimposed on stage 4 chronic kidney disease [facility]): Renal ultrasound is negative for hydronephrosis.   Urinalysis negative few bacteria. Baseline creatinine 2.  Currently 3.79, improving from 5s Plan: Holding diuretics Continue gentle hydration Nephrology following  COPD (chronic obstructive pulmonary disease) (HCC) CAP patient has cough with a lot of clear mucus production, denies chest pain has mild shortness of breath. CXR showed Ill-defined opacity within the lateral left lung base.  Patient does not have fever or leukocytosis. Covid negative 7/15. Rvp neg. Dimer neg. Still with cough but it's improving. CT with signs atelectasis Plan: Bronchodilator  empiric azithromycin Added ceftriaxone today day 3 Will add dex cont incentive spirometry Will check urine antigens and sputum culture. Strep neg, sputum culture pending, legionella pending  Dysuria New 7/17, ua consistent w/ possible infection. Dysuria resolved. Multiple species on culture.  Continuing ceftriaxone as above   Chronic diastolic CHF (congestive heart failure) (HCC): 2D echo 06/19/2022 showed EF of 60 to 65%.  Patient has trace leg edema, no pulmonary on chest x-ray, clinically does not seem to have CHF exacerbation Plan: Diuretics on hold BNP 273.  Not necessarily indicative of decompensated heart failure   HLD (hyperlipidemia) PTA rosuvastatin   Essential hypertension Bp elevated -IV hydralazine as needed -Amlodipine will increase to 10, cont hydralazine but have increased to tid, cont metoprolol consider switch to coreg   OSA - on CPAP nightly  New  onset A-fib Noted on telemetry on 7/15.  Max ventricular rate 120.  Resolved spontaneously without intervention after correction of electrolytes.  Last  echocardiogram from September 2023.  Normal ejection fraction with no diastolic dysfunction.  Will maintain on telemetry for now.  Defer cardiology consultation and anticoagulation for now.  If patient goes back into rapid atrial fibrillation consider cardiology evaluation. Has not had recurrence. Tte with mild phtn, severe LA dilation.   DVT prophylaxis: SQ heparin Code Status: Full Family Communication: Daughter theresa updated telephonically 7/20 Remains inpatient appropriate because: continuing iv fluids and monitoring renal recovery     Level of care: Telemetry Medical  Consultants:  Nephrology  Procedures:  None  Antimicrobials: Azithromycin, ceftriaxone   Subjective: Seen and examined.  Ongoing cough but thinks breathing is better. Sitting at edge of bed. Weakness improving  Objective: Vitals:   04/21/23 0242 04/21/23 0500 04/21/23 0512 04/21/23 0737  BP:   (!) 158/47 (!) 162/56  Pulse:   64 62  Resp:   18 19  Temp:   98.2 F (36.8 C) 98.2 F (36.8 C)  TempSrc:   Oral   SpO2: 98%  97% 98%  Weight:  78.9 kg    Height:        Intake/Output Summary (Last 24 hours) at 04/21/2023 1336 Last data filed at 04/21/2023 0924 Gross per 24 hour  Intake 240 ml  Output --  Net 240 ml   Filed Weights   04/17/23 0500 04/19/23 0331 04/21/23 0500  Weight: 77.4 kg 78.3 kg 78.9 kg    Examination:  General exam: NAD.  Appears frail Respiratory system: faint rhonchi. Respiratory effort normal. Cardiovascular system: S1-S2, RRR, no murmurs, trace pedal edema Gastrointestinal system: Soft, NT/ND, normal bowel sounds Central nervous system: Alert and oriented. No focal neurological deficits. Extremities: Symmetrical   power bilaterally Skin: No rashes, lesions or ulcers Psychiatry: Judgement and insight appear normal. Mood & affect appropriate.     Data Reviewed: I have personally reviewed following labs and imaging studies  CBC: Recent Labs  Lab 04/16/23 1235  04/17/23 0433 04/19/23 0358 04/20/23 0800 04/21/23 0425  WBC 7.2 7.6 7.8 7.1 7.3  HGB 10.6* 9.5* 9.0* 9.8* 8.8*  HCT 33.2* 29.7* 27.9* 31.1* 26.8*  MCV 89.7 88.4 88.0 87.6 86.2  PLT 198 194 164 157 144*   Basic Metabolic Panel: Recent Labs  Lab 04/16/23 1235 04/17/23 0433 04/18/23 1005 04/18/23 1356 04/19/23 0358 04/20/23 0800 04/21/23 0425  NA 133* 138 134* 134* 136 140 137  K 4.0 4.2 3.4* 3.8 3.8 3.5 4.0  CL 96* 102 100 103 103 107 109  CO2 25 27 24 24 24 23 22   GLUCOSE 111* 111* 137* 90 101* 142* 94  BUN 78* 75* 75* 69* 73* 66* 63*  CREATININE 6.17* 5.73* 5.82* 5.46* 5.02* 4.04* 3.79*  CALCIUM 14.5* 12.9* 11.6* 11.4* 11.2* 11.2* 10.5*  MG 3.9* 3.6*  --   --  2.6* 2.0 1.8  PHOS 5.6*  --   --  4.7* 4.3 3.3 2.1*   GFR: Estimated Creatinine Clearance: 11.1 mL/min (A) (by C-G formula based on SCr of 3.79 mg/dL (H)). Liver Function Tests: Recent Labs  Lab 04/16/23 1235 04/18/23 1356 04/19/23 0358 04/20/23 0800 04/21/23 0425  AST 24  --   --   --   --   ALT 20  --   --   --   --   ALKPHOS 58  --   --   --   --  BILITOT 0.8  --   --   --   --   PROT 8.0  --   --   --   --   ALBUMIN 4.1 3.3* 3.2*  3.3* 3.3*  3.3* 3.4*   No results for input(s): "LIPASE", "AMYLASE" in the last 168 hours. No results for input(s): "AMMONIA" in the last 168 hours. Coagulation Profile: No results for input(s): "INR", "PROTIME" in the last 168 hours. Cardiac Enzymes: No results for input(s): "CKTOTAL", "CKMB", "CKMBINDEX", "TROPONINI" in the last 168 hours. BNP (last 3 results) No results for input(s): "PROBNP" in the last 8760 hours. HbA1C: No results for input(s): "HGBA1C" in the last 72 hours. CBG: No results for input(s): "GLUCAP" in the last 168 hours. Lipid Profile: No results for input(s): "CHOL", "HDL", "LDLCALC", "TRIG", "CHOLHDL", "LDLDIRECT" in the last 72 hours. Thyroid Function Tests: Recent Labs    04/18/23 1356  TSH 1.325   Anemia Panel: No results for  input(s): "VITAMINB12", "FOLATE", "FERRITIN", "TIBC", "IRON", "RETICCTPCT" in the last 72 hours. Sepsis Labs: No results for input(s): "PROCALCITON", "LATICACIDVEN" in the last 168 hours.  Recent Results (from the past 240 hour(s))  SARS Coronavirus 2 by RT PCR (hospital order, performed in [facility] hospital lab) *cepheid single result test* Anterior Nasal Swab     Status: None   Collection Time: 04/16/23  2:10 PM   Specimen: Anterior Nasal Swab  Result Value Ref Range Status   SARS Coronavirus 2 by RT PCR NEGATIVE NEGATIVE Final    Comment: (NOTE) SARS-CoV-2 target nucleic acids are NOT DETECTED.  The SARS-CoV-2 RNA is generally detectable in upper and lower respiratory specimens during the acute phase of infection. The lowest concentration of SARS-CoV-2 viral copies this assay can detect is 250 copies / mL. A negative result does not preclude SARS-CoV-2 infection and should not be used as the sole basis for treatment or other patient management decisions.  A negative result may occur with improper specimen collection / handling, submission of specimen other than nasopharyngeal swab, presence of viral mutation(s) within the areas targeted by this assay, and inadequate number of viral copies (<250 copies / mL). A negative result must be combined with clinical observations, patient history, and epidemiological information.  Fact Sheet for Patients:   RoadLapTop.co.za  Fact Sheet for Healthcare Providers: http://kim-miller.com/  This test is not yet approved or  cleared by the [place] FDA and has been authorized for detection and/or diagnosis of SARS-CoV-2 by FDA under an Emergency Use Authorization (EUA).  This EUA will remain in effect (meaning this test can be used) for the duration of the COVID-19 declaration under Section 564(b)(1) of the Act, 21 U.S.C. section 360bbb-3(b)(1), unless the authorization is terminated  or revoked sooner.  Performed at [facility], [address] [place], [place] [address]   Respiratory (~20 pathogens) panel by PCR     Status: None   Collection Time: 04/18/23  1:27 PM   Specimen: Nasopharyngeal Swab; Respiratory  Result Value Ref Range Status   Adenovirus NOT DETECTED NOT DETECTED Final   Coronavirus 229E NOT DETECTED NOT DETECTED Final    Comment: (NOTE) The Coronavirus on the Respiratory Panel, DOES NOT test for the novel  Coronavirus (2019 nCoV)    Coronavirus HKU1 NOT DETECTED NOT DETECTED Final   Coronavirus NL63 NOT DETECTED NOT DETECTED Final   Coronavirus OC43 NOT DETECTED NOT DETECTED Final   Metapneumovirus NOT DETECTED NOT DETECTED Final   Rhinovirus / Enterovirus NOT DETECTED NOT DETECTED Final  Influenza A NOT DETECTED NOT DETECTED Final   Influenza B NOT DETECTED NOT DETECTED Final   Parainfluenza Virus 1 NOT DETECTED NOT DETECTED Final   Parainfluenza Virus 2 NOT DETECTED NOT DETECTED Final   Parainfluenza Virus 3 NOT DETECTED NOT DETECTED Final   Parainfluenza Virus 4 NOT DETECTED NOT DETECTED Final   Respiratory Syncytial Virus NOT DETECTED NOT DETECTED Final   Bordetella pertussis NOT DETECTED NOT DETECTED Final   Bordetella Parapertussis NOT DETECTED NOT DETECTED Final   Chlamydophila pneumoniae NOT DETECTED NOT DETECTED Final   Mycoplasma pneumoniae NOT DETECTED NOT DETECTED Final    Comment: Performed at [facility], [address] [place], [place] [address]  Urine Culture (for pregnant, neutropenic or urologic patients or patients with an indwelling urinary catheter)     Status: Abnormal   Collection Time: 04/18/23  1:28 PM   Specimen: Urine, Clean Catch  Result Value Ref Range Status   Specimen Description   Final    URINE, CLEAN CATCH Performed at [facility], [address], [place], [place] [address]    Special Requests   Final    NONE Performed at [facility], [address]  [place], [place] [address]    Culture MULTIPLE SPECIES PRESENT, SUGGEST RECOLLECTION (A)  Final   Report Status 04/19/2023 FINAL  Final  Expectorated Sputum Assessment w Gram Stain, Rflx to Resp Cult     Status: None   Collection Time: 04/19/23  2:00 PM   Specimen: Sputum  Result Value Ref Range Status   Specimen Description SPUTUM  Final   Special Requests NONE  Final   Sputum evaluation   Final    THIS SPECIMEN IS ACCEPTABLE FOR SPUTUM CULTURE Performed at [facility], [address] [place], [place] [address]    Report Status 04/19/2023 FINAL  Final  Culture, Respiratory w Gram Stain     Status: None (Preliminary result)   Collection Time: 04/19/23  2:00 PM   Specimen: SPU  Result Value Ref Range Status   Specimen Description   Final    SPUTUM Performed at [facility], [address] [place], [place] [address]    Special Requests   Final    NONE Reflexed from [id number] Performed at [facility], [address] [place], [place] [address]    Gram Stain   Final    FEW SQUAMOUS EPITHELIAL CELLS PRESENT FEW WBC PRESENT, PREDOMINANTLY MONONUCLEAR MODERATE GRAM POSITIVE COCCI IN PAIRS AND CHAINS MODERATE GRAM NEGATIVE RODS FEW YEAST    Culture   Final    TOO YOUNG TO READ Performed at [facility], [address] [place], [place] [address]    Report Status PENDING  Incomplete         Radiology Studies: ECHOCARDIOGRAM COMPLETE BUBBLE STUDY  Result Date: 04/19/2023    ECHOCARDIOGRAM REPORT   Patient Name:   [name] [facility] Date of Exam: 04/19/2023 Medical Rec #:  [id number]     Height:       62.0 in Accession #:    [id number]    Weight:       172.6 lb Date of Birth:  [birth year]     BSA:          1.796 m Patient Age:    82 years      BP:           162/46 mmHg Patient Gender: F  HR:           59 bpm. Exam Location:  [facility] Procedure: 2D Echo, Cardiac Doppler and Color Doppler Indications:     Atrial Fibrillation  History:         Patient  has prior history of Echocardiogram examinations, most                  recent 06/17/2022. CHF, COPD, Arrythmias:Atrial Fibrillation,                  Signs/Symptoms:Dizziness/Lightheadedness and Dyspnea; Risk                  Factors:Hypertension, Diabetes and Dyslipidemia. CKD.  Sonographer:     Mikki Harbor Referring Phys:  [id number] Emilio Baylock BEDFORD BJYN Diagnosing Phys: Debbe Odea MD IMPRESSIONS  1. Left ventricular ejection fraction, by estimation, is 60 to 65%. The left ventricle has normal function. The left ventricle has no regional wall motion abnormalities. There is mild left ventricular hypertrophy. Left ventricular diastolic parameters are indeterminate.  2. Right ventricular systolic function is normal. The right ventricular size is mildly enlarged. There is mildly elevated pulmonary artery systolic pressure.  3. Left atrial size was severely dilated.  4. Right atrial size was mildly dilated.  5. The mitral valve is normal in structure. Mild mitral valve regurgitation.  6. The aortic valve is tricuspid. Aortic valve regurgitation is not visualized.  7. The inferior vena cava is normal in size with greater than 50% respiratory variability, suggesting right atrial pressure of 3 mmHg. FINDINGS  Left Ventricle: Left ventricular ejection fraction, by estimation, is 60 to 65%. The left ventricle has normal function. The left ventricle has no regional wall motion abnormalities. The left ventricular internal cavity size was normal in size. There is  mild left ventricular hypertrophy. Left ventricular diastolic parameters are indeterminate. Right Ventricle: The right ventricular size is mildly enlarged. No increase in right ventricular wall thickness. Right ventricular systolic function is normal. There is mildly elevated pulmonary artery systolic pressure. The tricuspid regurgitant velocity is 2.68 m/s, and with an assumed right atrial pressure of 8 mmHg, the estimated right ventricular systolic pressure is  36.7 mmHg. Left Atrium: Left atrial size was severely dilated. Right Atrium: Right atrial size was mildly dilated. Pericardium: There is no evidence of pericardial effusion. Mitral Valve: The mitral valve is normal in structure. Mild mitral valve regurgitation. MV peak gradient, 5.8 mmHg. The mean mitral valve gradient is 2.0 mmHg. Tricuspid Valve: The tricuspid valve is normal in structure. Tricuspid valve regurgitation is mild. Aortic Valve: The aortic valve is tricuspid. Aortic valve regurgitation is not visualized. Aortic valve mean gradient measures 7.0 mmHg. Aortic valve peak gradient measures 14.0 mmHg. Aortic valve area, by VTI measures 2.40 cm. Pulmonic Valve: The pulmonic valve was normal in structure. Pulmonic valve regurgitation is trivial. Aorta: The aortic root is normal in size and structure. Venous: The inferior vena cava is normal in size with greater than 50% respiratory variability, suggesting right atrial pressure of 3 mmHg. IAS/Shunts: No atrial level shunt detected by color flow Doppler.  LEFT VENTRICLE PLAX 2D LVIDd:         5.70 cm   Diastology LVIDs:         3.10 cm   LV e' medial:    6.85 cm/s LV PW:         1.50 cm   LV E/e' medial:  14.3 LV IVS:        1.70 cm  LV e' lateral:   7.83 cm/s LVOT diam:     1.90 cm   LV E/e' lateral: 12.5 LV SV:         109 LV SV Index:   61 LVOT Area:     2.84 cm  RIGHT VENTRICLE RV Basal diam:  3.95 cm RV Mid diam:    3.40 cm RV S prime:     19.00 cm/s TAPSE (M-mode): 2.6 cm LEFT ATRIUM              Index        RIGHT ATRIUM           Index LA diam:        5.80 cm  3.23 cm/m   RA Area:     21.20 cm LA Vol (A2C):   137.0 ml 76.29 ml/m  RA Volume:   64.80 ml  36.09 ml/m LA Vol (A4C):   103.0 ml 57.36 ml/m LA Biplane Vol: 130.0 ml 72.39 ml/m  AORTIC VALVE                     PULMONIC VALVE AV Area (Vmax):    2.26 cm      PV Vmax:       1.35 m/s AV Area (Vmean):   2.38 cm      PV Peak grad:  7.3 mmHg AV Area (VTI):     2.40 cm AV Vmax:            187.00 cm/s AV Vmean:          119.000 cm/s AV VTI:            0.454 m AV Peak Grad:      14.0 mmHg AV Mean Grad:      7.0 mmHg LVOT Vmax:         149.00 cm/s LVOT Vmean:        100.000 cm/s LVOT VTI:          0.384 m LVOT/AV VTI ratio: 0.85  AORTA Ao Root diam: 3.10 cm MITRAL VALVE                TRICUSPID VALVE MV Area (PHT): 3.31 cm     TR Peak grad:   28.7 mmHg MV Area VTI:   2.81 cm     TR Vmax:        268.00 cm/s MV Peak grad:  5.8 mmHg MV Mean grad:  2.0 mmHg     SHUNTS MV Vmax:       1.20 m/s     Systemic VTI:  0.38 m MV Vmean:      59.7 cm/s    Systemic Diam: 1.90 cm MV Decel Time: 229 msec MV E velocity: 98.00 cm/s MV A velocity: 102.00 cm/s MV E/A ratio:  0.96 Debbe Odea MD Electronically signed by Debbe Odea MD Signature Date/Time: 04/19/2023/5:23:04 PM    Final         Scheduled Meds:  amLODipine  10 mg Oral Daily   azelastine  1 spray Each Nare BID   feeding supplement  237 mL Oral BID BM   heparin  5,000 Units Subcutaneous Q8H   hydrALAZINE  100 mg Oral TID   levothyroxine  150 mcg Oral Daily   metoprolol tartrate  50 mg Oral BID   mometasone-formoterol  2 puff Inhalation BID   montelukast  10 mg Oral QHS   multivitamin with minerals  1 tablet Oral Daily   pantoprazole  40 mg Oral Daily   rosuvastatin  40 mg Oral Daily   sertraline  100 mg Oral Daily   Continuous Infusions:  sodium chloride 50 mL/hr at 04/21/23 1115   cefTRIAXone (ROCEPHIN)  IV 1 g (04/20/23 1422)     LOS: 4 days    Silvano Bilis, MD [facility]   If 7PM-7AM, please contact night-coverage  04/21/2023, 1:36 PM

## 2023-04-22 DIAGNOSIS — E878 Other disorders of electrolyte and fluid balance, not elsewhere classified: Secondary | ICD-10-CM | POA: Diagnosis not present

## 2023-04-22 LAB — RENAL FUNCTION PANEL
Albumin: 3 g/dL — ABNORMAL LOW (ref 3.5–5.0)
Anion gap: 7 (ref 5–15)
BUN: 59 mg/dL — ABNORMAL HIGH (ref 8–23)
CO2: 21 mmol/L — ABNORMAL LOW (ref 22–32)
Calcium: 10.2 mg/dL (ref 8.9–10.3)
Chloride: 111 mmol/L (ref 98–111)
Creatinine, Ser: 3.44 mg/dL — ABNORMAL HIGH (ref 0.44–1.00)
GFR, Estimated: 13 mL/min — ABNORMAL LOW (ref >60)
Glucose, Bld: 96 mg/dL (ref 70–99)
Phosphorus: 2.3 mg/dL — ABNORMAL LOW (ref 2.5–4.6)
Potassium: 4.4 mmol/L (ref 3.5–5.1)
Sodium: 139 mmol/L (ref 135–145)

## 2023-04-22 LAB — CBC
HCT: 25.9 % — ABNORMAL LOW (ref 36.0–46.0)
Hemoglobin: 8.4 g/dL — ABNORMAL LOW (ref 12.0–15.0)
MCH: 27.9 pg (ref 26.0–34.0)
MCHC: 32.4 g/dL (ref 30.0–36.0)
MCV: 86 fL (ref 80.0–100.0)
Platelets: 139 10*3/uL — ABNORMAL LOW (ref 150–400)
RBC: 3.01 MIL/uL — ABNORMAL LOW (ref 3.87–5.11)
RDW: 13.6 % (ref 11.5–15.5)
WBC: 5.6 10*3/uL (ref 4.0–10.5)
nRBC: 0 % (ref 0.0–0.2)

## 2023-04-22 MED ORDER — AMLODIPINE BESYLATE 10 MG PO TABS: 5.0000 mg | ORAL_TABLET | Freq: Every day | ORAL | 1 refills | Status: DC

## 2023-04-22 MED ORDER — HYDRALAZINE HCL 100 MG PO TABS: 100.0000 mg | ORAL_TABLET | Freq: Three times a day (TID) | ORAL | 1 refills | Status: DC

## 2023-04-22 NOTE — Plan of Care (Signed)

## 2023-04-22 NOTE — Plan of Care (Signed)

## 2023-04-22 NOTE — Discharge Summary (Signed)
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 female with medical history significant of CKD-4, HTN, HLD, COPD, dCHF, hypothyroidism, OSA on CPAP, depression, who presents with weakness.    Patient was recently hospitalized from 6/21 - 6/25 due to hypocalcemia and hypomagnesemia, which were repleted and normalized prior to discharge. Pt was started on Vitamin D and Calcitriol, and magnesium oxide.  Patient states that she has generalized weakness for almost a week, which which has been progressively worsening.  No unilateral numbness or tinglings in extremities.  No facial droop or slurred speech.  Patient also reports cough with mild shortness of breath, denies chest pain, fever or chills.  She coughs up a lot of clear mucus.  She was seen in urgent care, and had negative PCR for COVID, flu and RSV.  Patient reports nausea, no vomiting, diarrhea or abdominal pain.  Denies symptoms of UTI.    Hospital Course:   Electrolyte  abnormality, hypercalcemia and hypermagnesemia Calcium of 14.5, magnesium 3.9, potassium 4.0 on presentation.  Likely due to overcorrection, recent calcitriol.  Mental status normal.  Electrolytes have improved, calcium now normalized Plan: Hold calcium and vitamin D supplement Hold magnesium oxide   Debility PT now advising home health, will plan on that at d/c. Orders have been placed (pt/ot/rn/sw)   Acute renal failure superimposed on stage 4 chronic kidney disease [facility]): Renal ultrasound is negative for hydronephrosis.   Urinalysis negative few bacteria. Baseline creatinine 2.  Currently 3.44, improving from 5s on presentation. Treated with gentle hydration. Nephrology followed while inpatient Plan: Holding diuretics Close nephrology f/u.   COPD (chronic obstructive pulmonary disease) (HCC) CAP patient has cough with a lot of clear mucus production, denies chest pain has mild shortness of breath. CXR showed Ill-defined opacity within the lateral left lung base.  Patient does not have fever or leukocytosis. Covid negative 7/15. Rvp neg. Dimer neg. Still with cough but it's improving. CT with signs atelectasis. Treated with abx and steroids. Now much improved.   Chronic diastolic CHF (congestive heart failure) (HCC): 2D echo 06/19/2022 showed EF of 60 to 65%.  Patient has trace leg edema, no pulmonary on chest x-ray, clinically does not seem to have CHF exacerbation   Essential hypertension Bp elevated here - diuretics on hold -Amlodipine will increase to 10, cont hydralazine but have increased to tid, cont metoprolol   OSA - on CPAP nightly   New onset A-fib Noted on telemetry on 7/15.  Max ventricular rate 120.  Resolved spontaneously  without intervention after correction of electrolytes.  Tte with mild phtn, severe LA dilation. Monitored on telemetry and had no recurrence. Anticoagulation deferred, outpt cardiology f/u advised  Procedures: none    Consultations: nephrology  Discharge Exam: Vitals:   04/22/23 0840 04/22/23 1300  BP: (!) 190/52 (!) 161/54  Pulse: 60 63  Resp: 18   Temp: 97.6 F (36.4 C)   SpO2: 100%     General exam: NAD.  Appears frail Respiratory system: faint rhonchi. Respiratory effort normal. Cardiovascular system: S1-S2, RRR, no murmurs, trace pedal edema Gastrointestinal system: Soft, NT/ND, normal bowel sounds Central nervous system: Alert and oriented. No focal neurological deficits. Extremities: Symmetrical   power bilaterally Skin: No rashes, lesions or ulcers Psychiatry: Judgement and insight appear normal. Mood & affect appropriate.   Discharge Instructions   Discharge Instructions     Diet - low sodium heart healthy   Complete by: As directed    Increase activity slowly   Complete by: As directed    No wound care   Complete by: As directed       Allergies as of 04/22/2023       Reactions   Ace Inhibitors    Other reaction(s): Unknown   Egg-derived Products Diarrhea   Other    Other reaction(s): Other (See Comments) Eggs   Prednisone    Other reaction(s): Other (See Comments) joint pain   Risedronate    Other reaction(s): Other (See Comments)   Sulfa Antibiotics Itching, Swelling   Other reaction(s): Other (See Comments)   Sulfasalazine Other (See Comments)        Medication List     STOP taking these medications    calcitRIOL 0.5 MCG capsule Commonly known as: ROCALTROL   magnesium oxide 400 (240 Mg) MG tablet Commonly known as: MAG-OX   potassium chloride SA 20 MEQ tablet Commonly known as: KLOR-CON M   spironolactone 25 MG tablet Commonly known as: ALDACTONE   torsemide 10 MG tablet Commonly known as: DEMADEX   Vitamin D (Ergocalciferol) 1.25 MG (50000 UNIT) Caps capsule Commonly known as: DRISDOL       TAKE these medications    acetaminophen 500 MG tablet Commonly known as: TYLENOL Take 1,000 mg by mouth every 6 (six) hours as needed for  mild pain or moderate pain.   albuterol 108 (90 Base) MCG/ACT inhaler Commonly known as: VENTOLIN HFA Inhale 2 puffs into the lungs every 6 (six) hours as needed for wheezing or shortness of breath.   amLODipine 10 MG tablet Commonly known as: NORVASC Take 0.5 tablets (5 mg total) by mouth daily. What changed: medication strength   azelastine 0.1 % nasal spray Commonly known as: ASTELIN Place 1 spray into both nostrils 2 (two) times daily.   CALCIUM & VIT D3 BONE HEALTH PO Take 1 tablet by mouth daily. 600 mg/ 25 mg   esomeprazole 40 MG capsule Commonly known as: NEXIUM Take 40 mg by mouth daily before breakfast.   fluticasone 50 MCG/ACT nasal spray Commonly known as: FLONASE Place 2 sprays into both nostrils daily.   fluticasone-salmeterol 500-50 MCG/ACT Aepb Commonly known as: ADVAIR Inhale 1 puff into the lungs in the morning and at bedtime.   hydrALAZINE 100 MG tablet Commonly known as: APRESOLINE Take 1 tablet (100 mg total) by mouth 3 (three) times daily. What changed: when to take this   ipratropium-albuterol 0.5-2.5 (3) MG/3ML Soln Commonly known as: DUONEB Take 3 mLs by nebulization every 6 (six) hours as needed (SOB).  levothyroxine 150 MCG tablet Commonly known as: SYNTHROID Take 150 mcg by mouth daily.   lidocaine 5 % Commonly known as: Lidoderm Place 1 patch onto the skin every 12 (twelve) hours. Remove & Discard patch within 12 hours or as directed by MD   loperamide 2 MG capsule Commonly known as: IMODIUM Take 1 capsule (2 mg total) by mouth as needed for diarrhea or loose stools.   metoprolol tartrate 50 MG tablet Commonly known as: LOPRESSOR Take 50 mg by mouth 2 (two) times daily.   montelukast 10 MG tablet Commonly known as: SINGULAIR Take 10 mg by mouth at bedtime.   rosuvastatin 40 MG tablet Commonly known as: CRESTOR Take 40 mg by mouth daily.   sertraline 100 MG tablet Commonly known as: ZOLOFT Take 100 mg by mouth daily.        Allergies  Allergen Reactions   Ace Inhibitors     Other reaction(s): Unknown   Egg-Derived Products Diarrhea   Other     Other reaction(s): Other (See Comments) Eggs   Prednisone     Other reaction(s): Other (See Comments) joint pain   Risedronate     Other reaction(s): Other (See Comments)   Sulfa Antibiotics Itching and Swelling    Other reaction(s): Other (See Comments)   Sulfasalazine Other (See Comments)    Follow-up Information     Lauro Regulus, MD Follow up.   Specialty: Internal Medicine Contact information: [address] [place] [address] [phone]         Mady Haagensen, MD Follow up.   Specialty: Nephrology Contact information: [address] [place] [address] [phone]         Iran Ouch, MD Follow up.   Specialty: Cardiology Contact information: [address] [place] [address] [phone]                  The results of significant diagnostics from this hospitalization (including imaging, microbiology, ancillary and laboratory) are listed below for reference.    Significant Diagnostic Studies: ECHOCARDIOGRAM COMPLETE BUBBLE STUDY  Result Date: 04/19/2023    ECHOCARDIOGRAM REPORT   Patient Name:   DELTA PICHON [facility] Date of Exam: 04/19/2023 Medical Rec #:  [id number]     Height:       62.0 in Accession #:    [id number]    Weight:       172.6 lb Date of Birth:  10-16-1940     BSA:          1.796 m Patient Age:    82 years      BP:           162/46 mmHg Patient Gender: F             HR:           59 bpm. Exam Location:  [facility] Procedure: 2D Echo, Cardiac Doppler and Color Doppler Indications:     Atrial Fibrillation  History:         Patient has prior history of Echocardiogram examinations, most                  recent 06/17/2022. CHF, COPD, Arrythmias:Atrial Fibrillation,                  Signs/Symptoms:Dizziness/Lightheadedness and Dyspnea; Risk                  Factors:Hypertension,  Diabetes and Dyslipidemia. CKD.  Sonographer:  Mikki Harbor Referring Phys:  [id number] Mykael Trott BEDFORD Stephanie Littman Diagnosing Phys: Debbe Odea MD IMPRESSIONS  1. Left ventricular ejection fraction, by estimation, is 60 to 65%. The left ventricle has normal function. The left ventricle has no regional wall motion abnormalities. There is mild left ventricular hypertrophy. Left ventricular diastolic parameters are indeterminate.  2. Right ventricular systolic function is normal. The right ventricular size is mildly enlarged. There is mildly elevated pulmonary artery systolic pressure.  3. Left atrial size was severely dilated.  4. Right atrial size was mildly dilated.  5. The mitral valve is normal in structure. Mild mitral valve regurgitation.  6. The aortic valve is tricuspid. Aortic valve regurgitation is not visualized.  7. The inferior vena cava is normal in size with greater than 50% respiratory variability, suggesting right atrial pressure of 3 mmHg. FINDINGS  Left Ventricle: Left ventricular ejection fraction, by estimation, is 60 to 65%. The left ventricle has normal function. The left ventricle has no regional wall motion abnormalities. The left ventricular internal cavity size was normal in size. There is  mild left ventricular hypertrophy. Left ventricular diastolic parameters are indeterminate. Right Ventricle: The right ventricular size is mildly enlarged. No increase in right ventricular wall thickness. Right ventricular systolic function is normal. There is mildly elevated pulmonary artery systolic pressure. The tricuspid regurgitant velocity is 2.68 m/s, and with an assumed right atrial pressure of 8 mmHg, the estimated right ventricular systolic pressure is 36.7 mmHg. Left Atrium: Left atrial size was severely dilated. Right Atrium: Right atrial size was mildly dilated. Pericardium: There is no evidence of pericardial effusion. Mitral Valve: The mitral valve is normal in structure. Mild mitral valve  regurgitation. MV peak gradient, 5.8 mmHg. The mean mitral valve gradient is 2.0 mmHg. Tricuspid Valve: The tricuspid valve is normal in structure. Tricuspid valve regurgitation is mild. Aortic Valve: The aortic valve is tricuspid. Aortic valve regurgitation is not visualized. Aortic valve mean gradient measures 7.0 mmHg. Aortic valve peak gradient measures 14.0 mmHg. Aortic valve area, by VTI measures 2.40 cm. Pulmonic Valve: The pulmonic valve was normal in structure. Pulmonic valve regurgitation is trivial. Aorta: The aortic root is normal in size and structure. Venous: The inferior vena cava is normal in size with greater than 50% respiratory variability, suggesting right atrial pressure of 3 mmHg. IAS/Shunts: No atrial level shunt detected by color flow Doppler.  LEFT VENTRICLE PLAX 2D LVIDd:         5.70 cm   Diastology LVIDs:         3.10 cm   LV e' medial:    6.85 cm/s LV PW:         1.50 cm   LV E/e' medial:  14.3 LV IVS:        1.70 cm   LV e' lateral:   7.83 cm/s LVOT diam:     1.90 cm   LV E/e' lateral: 12.5 LV SV:         109 LV SV Index:   61 LVOT Area:     2.84 cm  RIGHT VENTRICLE RV Basal diam:  3.95 cm RV Mid diam:    3.40 cm RV S prime:     19.00 cm/s TAPSE (M-mode): 2.6 cm LEFT ATRIUM              Index        RIGHT ATRIUM           Index LA diam:        5.80  cm  3.23 cm/m   RA Area:     21.20 cm LA Vol (A2C):   137.0 ml 76.29 ml/m  RA Volume:   64.80 ml  36.09 ml/m LA Vol (A4C):   103.0 ml 57.36 ml/m LA Biplane Vol: 130.0 ml 72.39 ml/m  AORTIC VALVE                     PULMONIC VALVE AV Area (Vmax):    2.26 cm      PV Vmax:       1.35 m/s AV Area (Vmean):   2.38 cm      PV Peak grad:  7.3 mmHg AV Area (VTI):     2.40 cm AV Vmax:           187.00 cm/s AV Vmean:          119.000 cm/s AV VTI:            0.454 m AV Peak Grad:      14.0 mmHg AV Mean Grad:      7.0 mmHg LVOT Vmax:         149.00 cm/s LVOT Vmean:        100.000 cm/s LVOT VTI:          0.384 m LVOT/AV VTI ratio: 0.85  AORTA Ao  Root diam: 3.10 cm MITRAL VALVE                TRICUSPID VALVE MV Area (PHT): 3.31 cm     TR Peak grad:   28.7 mmHg MV Area VTI:   2.81 cm     TR Vmax:        268.00 cm/s MV Peak grad:  5.8 mmHg MV Mean grad:  2.0 mmHg     SHUNTS MV Vmax:       1.20 m/s     Systemic VTI:  0.38 m MV Vmean:      59.7 cm/s    Systemic Diam: 1.90 cm MV Decel Time: 229 msec MV E velocity: 98.00 cm/s MV A velocity: 102.00 cm/s MV E/A ratio:  0.96 Debbe Odea MD Electronically signed by Debbe Odea MD Signature Date/Time: 04/19/2023/5:23:04 PM    Final    CT CHEST WO CONTRAST  Result Date: 04/18/2023 CLINICAL DATA:  Cough EXAM: CT CHEST WITHOUT CONTRAST TECHNIQUE: Multidetector CT imaging of the chest was performed following the standard protocol without IV contrast. RADIATION DOSE REDUCTION: This exam was performed according to the departmental dose-optimization program which includes automated exposure control, adjustment of the mA and/or kV according to patient size and/or use of iterative reconstruction technique. COMPARISON:  CT 06/10/2022, chest x-ray 04/16/2023, renal ultrasound 02/16/2021 FINDINGS: Cardiovascular: Limited evaluation without intravenous contrast. Moderate aortic atherosclerosis. No aneurysm. Cardiomegaly. Coronary vascular calcification. No pericardial effusion Mediastinum/Nodes: Midline trachea. No thyroid mass. No suspicious mediastinal lymph nodes. Esophagus within normal limits. Lungs/Pleura: Bandlike densities within the bilateral lower lobes felt consistent with subsegmental atelectasis. No pleural effusion or pneumothorax. Upper Abdomen: No acute finding. Partially visualized right renal cysts, no specific imaging follow-up recommended. Musculoskeletal: No acute or suspicious osseous abnormality. IMPRESSION: 1. Bandlike densities within the bilateral lower lobes felt consistent with subsegmental atelectasis. 2. Cardiomegaly. 3. Aortic atherosclerosis. Aortic Atherosclerosis (ICD10-I70.0).  Electronically Signed   By: Jasmine Pang M.D.   On: 04/18/2023 17:41   US RENAL  Result Date: 04/16/2023 CLINICAL DATA:  Acute kidney injury EXAM: RENAL / URINARY TRACT ULTRASOUND COMPLETE COMPARISON:  None Available. FINDINGS: Right Kidney: Renal  measurements: 10.2 x 5.6 x 5.2 cm = volume: 156 mL. Echogenicity within normal limits. No mass or hydronephrosis visualized. Benign simple cyst requiring no further follow-up or characterization. Left Kidney: Renal measurements: 11.1 x 5.4 x 4.1 cm = volume: 127 mL. Echogenicity within normal limits. No mass or hydronephrosis visualized. Bladder: Appears normal for degree of bladder distention. Other: None. IMPRESSION: No hydronephrosis. Electronically Signed   By: Jearld Lesch M.D.   On: 04/16/2023 16:02   DG Chest Port 1 View  Result Date: 04/16/2023 CLINICAL DATA:  Provided history: Cough. EXAM: PORTABLE CHEST 1 VIEW COMPARISON:  Prior chest radiograph 03/25/2023 and earlier. FINDINGS: Cardiomegaly. Small ill-defined opacity within the lateral left lung base. No appreciable airspace consolidation on the right. No evidence of pleural effusion or pneumothorax. No acute osseous abnormality identified. Degenerative changes of the spine. IMPRESSION: IMPRESSION 1. Ill-defined opacity within the lateral left lung base, which may reflect atelectasis or pneumonia. 2. Cardiomegaly. 3. Aortic Atherosclerosis (ICD10-I70.0). Electronically Signed   By: Jackey Loge D.O.   On: 04/16/2023 14:37   DG Chest Port 1 View  Result Date: 03/25/2023 CLINICAL DATA:  Shortness of breath EXAM: PORTABLE CHEST 1 VIEW COMPARISON:  08/02/2022 FINDINGS: Cardiac shadow is enlarged. Vascular congestion is noted without significant edema. No focal infiltrate is seen. No bony abnormality is noted. IMPRESSION: Mild CHF. Electronically Signed   By: Alcide Clever M.D.   On: 03/25/2023 09:46    Microbiology: Recent Results (from the past 240 hour(s))  SARS Coronavirus 2 by RT PCR (hospital  order, performed in [facility] hospital lab) *cepheid single result test* Anterior Nasal Swab     Status: None   Collection Time: 04/16/23  2:10 PM   Specimen: Anterior Nasal Swab  Result Value Ref Range Status   SARS Coronavirus 2 by RT PCR NEGATIVE NEGATIVE Final    Comment: (NOTE) SARS-CoV-2 target nucleic acids are NOT DETECTED.  The SARS-CoV-2 RNA is generally detectable in upper and lower respiratory specimens during the acute phase of infection. The lowest concentration of SARS-CoV-2 viral copies this assay can detect is 250 copies / mL. A negative result does not preclude SARS-CoV-2 infection and should not be used as the sole basis for treatment or other patient management decisions.  A negative result may occur with improper specimen collection / handling, submission of specimen other than nasopharyngeal swab, presence of viral mutation(s) within the areas targeted by this assay, and inadequate number of viral copies (<250 copies / mL). A negative result must be combined with clinical observations, patient history, and epidemiological information.  Fact Sheet for Patients:   RoadLapTop.co.za  Fact Sheet for Healthcare Providers: http://kim-miller.com/  This test is not yet approved or  cleared by the [place] FDA and has been authorized for detection and/or diagnosis of SARS-CoV-2 by FDA under an Emergency Use Authorization (EUA).  This EUA will remain in effect (meaning this test can be used) for the duration of the COVID-19 declaration under Section 564(b)(1) of the Act, 21 U.S.C. section 360bbb-3(b)(1), unless the authorization is terminated or revoked sooner.  Performed at [facility], [address] [place], [place] [address]   Respiratory (~20 pathogens) panel by PCR     Status: None   Collection Time: 04/18/23  1:27 PM   Specimen: Nasopharyngeal Swab; Respiratory  Result Value Ref Range Status    Adenovirus NOT DETECTED NOT DETECTED Final   Coronavirus 229E NOT DETECTED NOT DETECTED Final    Comment: (NOTE) The Coronavirus on  the Respiratory Panel, DOES NOT test for the novel  Coronavirus (2019 nCoV)    Coronavirus HKU1 NOT DETECTED NOT DETECTED Final   Coronavirus NL63 NOT DETECTED NOT DETECTED Final   Coronavirus OC43 NOT DETECTED NOT DETECTED Final   Metapneumovirus NOT DETECTED NOT DETECTED Final   Rhinovirus / Enterovirus NOT DETECTED NOT DETECTED Final   Influenza A NOT DETECTED NOT DETECTED Final   Influenza B NOT DETECTED NOT DETECTED Final   Parainfluenza Virus 1 NOT DETECTED NOT DETECTED Final   Parainfluenza Virus 2 NOT DETECTED NOT DETECTED Final   Parainfluenza Virus 3 NOT DETECTED NOT DETECTED Final   Parainfluenza Virus 4 NOT DETECTED NOT DETECTED Final   Respiratory Syncytial Virus NOT DETECTED NOT DETECTED Final   Bordetella pertussis NOT DETECTED NOT DETECTED Final   Bordetella Parapertussis NOT DETECTED NOT DETECTED Final   Chlamydophila pneumoniae NOT DETECTED NOT DETECTED Final   Mycoplasma pneumoniae NOT DETECTED NOT DETECTED Final    Comment: Performed at [facility], [address] [place], [place] [address]  Urine Culture (for pregnant, neutropenic or urologic patients or patients with an indwelling urinary catheter)     Status: Abnormal   Collection Time: 04/18/23  1:28 PM   Specimen: Urine, Clean Catch  Result Value Ref Range Status   Specimen Description   Final    URINE, CLEAN CATCH Performed at [facility], [address] [place], [place] [address]    Special Requests   Final    NONE Performed at [facility], [address], [place], [place] [address]    Culture MULTIPLE SPECIES PRESENT, SUGGEST RECOLLECTION (A)  Final   Report Status 04/19/2023 FINAL  Final  Expectorated Sputum Assessment w Gram Stain, Rflx to Resp Cult     Status: None   Collection Time: 04/19/23  2:00 PM   Specimen: Sputum  Result Value  Ref Range Status   Specimen Description SPUTUM  Final   Special Requests NONE  Final   Sputum evaluation   Final    THIS SPECIMEN IS ACCEPTABLE FOR SPUTUM CULTURE Performed at [facility], [address] [place], [place] [address]    Report Status 04/19/2023 FINAL  Final  Culture, Respiratory w Gram Stain     Status: None (Preliminary result)   Collection Time: 04/19/23  2:00 PM   Specimen: SPU  Result Value Ref Range Status   Specimen Description   Final    SPUTUM Performed at [facility], [address] [place], [place] [address]    Special Requests   Final    NONE Reflexed from [id number] Performed at [facility], [address] [place], [place] [address]    Gram Stain   Final    FEW SQUAMOUS EPITHELIAL CELLS PRESENT FEW WBC PRESENT, PREDOMINANTLY MONONUCLEAR MODERATE GRAM POSITIVE COCCI IN PAIRS AND CHAINS MODERATE GRAM NEGATIVE RODS FEW YEAST    Culture   Final    CULTURE REINCUBATED FOR BETTER GROWTH Performed at [facility], [address] [place], [place] [address]    Report Status PENDING  Incomplete     Labs: Basic Metabolic Panel: Recent Labs  Lab 04/16/23 1235 04/17/23 0433 04/18/23 1005 04/18/23 1356 04/19/23 0358 04/20/23 0800 04/21/23 0425 04/22/23 0436  NA 133* 138   < > 134* 136 140 137 139  K 4.0 4.2   < > 3.8 3.8 3.5 4.0 4.4  CL 96* 102   < > 103 103 107 109 111  CO2 25 27   < > 24 24 23 22  21*  GLUCOSE 111* 111*   < > 90 101* 142* 94 96  BUN 78* 75*   < > 69* 73* 66* 63* 59*  CREATININE 6.17* 5.73*   < > 5.46* 5.02* 4.04* 3.79* 3.44*  CALCIUM 14.5* 12.9*   < > 11.4* 11.2* 11.2* 10.5* 10.2  MG 3.9* 3.6*  --   --  2.6* 2.0 1.8  --   PHOS 5.6*  --   --  4.7* 4.3 3.3 2.1* 2.3*   < > = values in this interval not displayed.   Liver Function Tests: Recent Labs  Lab 04/16/23 1235 04/18/23 1356 04/19/23 0358 04/20/23 0800 04/21/23 0425 04/22/23 0436  AST 24  --   --   --   --   --   ALT 20  --   --    --   --   --   ALKPHOS 58  --   --   --   --   --   BILITOT 0.8  --   --   --   --   --   PROT 8.0  --   --   --   --   --   ALBUMIN 4.1 3.3* 3.2*  3.3* 3.3*  3.3* 3.4* 3.0*   No results for input(s): "LIPASE", "AMYLASE" in the last 168 hours. No results for input(s): "AMMONIA" in the last 168 hours. CBC: Recent Labs  Lab 04/17/23 0433 04/19/23 0358 04/20/23 0800 04/21/23 0425 04/22/23 0436  WBC 7.6 7.8 7.1 7.3 5.6  HGB 9.5* 9.0* 9.8* 8.8* 8.4*  HCT 29.7* 27.9* 31.1* 26.8* 25.9*  MCV 88.4 88.0 87.6 86.2 86.0  PLT 194 164 157 144* 139*   Cardiac Enzymes: No results for input(s): "CKTOTAL", "CKMB", "CKMBINDEX", "TROPONINI" in the last 168 hours. BNP: BNP (last 3 results) Recent Labs    06/10/22 0204 06/16/22 0734 04/16/23 1700  BNP 91.4 805.3* 273.4*    ProBNP (last 3 results) No results for input(s): "PROBNP" in the last 8760 hours.  CBG: No results for input(s): "GLUCAP" in the last 168 hours.     Signed:  Silvano Bilis MD.  [facility] 04/22/2023, 2:19 PM

## 2023-04-22 NOTE — Progress Notes (Signed)
[facility]  ROUNDING NOTE   Subjective:   [name] was admitted to [facility] on 04/16/2023 for Hypercalcemia [E83.52] Electrolyte abnormality [E87.8] AKI (acute kidney injury) [facility]) [N17.9]  Patient is known to our practice from previous admissions and is followed outpatient by Dr Cherylann Ratel.   Patient seen laying in bed Husband at bedside States she rested well overnight  Remains on room air  Calcium 10.2 (10.5) (11.2) (11.2) (11.4) (12.9) (14.5)  Objective:  Vital signs in last 24 hours:  Temp:  [97.6 F (36.4 C)-98.3 F (36.8 C)] 97.6 F (36.4 C) (07/21 0840) Pulse Rate:  [58-66] 60 (07/21 0840) Resp:  [16-18] 18 (07/21 0840) BP: (144-190)/(42-56) 190/52 (07/21 0840) SpO2:  [93 %-100 %] 100 % (07/21 0840) Weight:  [78.6 kg] 78.6 kg (07/21 0403)  Weight change: -0.3 kg Filed Weights   04/19/23 0331 04/21/23 0500 04/22/23 0403  Weight: 78.3 kg 78.9 kg 78.6 kg    Intake/Output: I/O last 3 completed shifts: In: 480 [P.O.:480] Out: -    Intake/Output this shift:  No intake/output data recorded.  Physical Exam: General: Laying in bed  Head: Normocephalic, atraumatic. Dry oral mucosal membranes  Eyes: Anicteric  Lungs:  Diminished at bases  Heart: Regular rate and rhythm  Abdomen:  Soft, nontender  Extremities:  no peripheral edema.  Neurologic: Alert and oriented, moving all four extremities  Skin: No lesions, tenting  Access: Left AVF    Basic Metabolic Panel: Recent Labs  Lab 04/16/23 1235 04/17/23 0433 04/18/23 1005 04/18/23 1356 04/19/23 0358 04/20/23 0800 04/21/23 0425 04/22/23 0436  NA 133* 138   < > 134* 136 140 137 139  K 4.0 4.2   < > 3.8 3.8 3.5 4.0 4.4  CL 96* 102   < > 103 103 107 109 111  CO2 25 27   < > 24 24 23 22  21*  GLUCOSE 111* 111*   < > 90 101* 142* 94 96  BUN 78* 75*   < > 69* 73* 66* 63* 59*  CREATININE 6.17* 5.73*   < > 5.46* 5.02* 4.04* 3.79* 3.44*  CALCIUM 14.5* 12.9*   < > 11.4* 11.2* 11.2* 10.5* 10.2   MG 3.9* 3.6*  --   --  2.6* 2.0 1.8  --   PHOS 5.6*  --   --  4.7* 4.3 3.3 2.1* 2.3*   < > = values in this interval not displayed.    Liver Function Tests: Recent Labs  Lab 04/16/23 1235 04/18/23 1356 04/19/23 0358 04/20/23 0800 04/21/23 0425 04/22/23 0436  AST 24  --   --   --   --   --   ALT 20  --   --   --   --   --   ALKPHOS 58  --   --   --   --   --   BILITOT 0.8  --   --   --   --   --   PROT 8.0  --   --   --   --   --   ALBUMIN 4.1 3.3* 3.2*  3.3* 3.3*  3.3* 3.4* 3.0*   No results for input(s): "LIPASE", "AMYLASE" in the last 168 hours. No results for input(s): "AMMONIA" in the last 168 hours.  CBC: Recent Labs  Lab 04/17/23 0433 04/19/23 0358 04/20/23 0800 04/21/23 0425 04/22/23 0436  WBC 7.6 7.8 7.1 7.3 5.6  HGB 9.5* 9.0* 9.8* 8.8* 8.4*  HCT 29.7* 27.9* 31.1*  26.8* 25.9*  MCV 88.4 88.0 87.6 86.2 86.0  PLT 194 164 157 144* 139*    Cardiac Enzymes: No results for input(s): "CKTOTAL", "CKMB", "CKMBINDEX", "TROPONINI" in the last 168 hours.  BNP: Invalid input(s): "POCBNP"  CBG: No results for input(s): "GLUCAP" in the last 168 hours.  Microbiology: Results for orders placed or performed during the hospital encounter of 04/16/23  SARS Coronavirus 2 by RT PCR (hospital order, performed in [facility] hospital lab) *cepheid single result test* Anterior Nasal Swab     Status: None   Collection Time: 04/16/23  2:10 PM   Specimen: Anterior Nasal Swab  Result Value Ref Range Status   SARS Coronavirus 2 by RT PCR NEGATIVE NEGATIVE Final    Comment: (NOTE) SARS-CoV-2 target nucleic acids are NOT DETECTED.  The SARS-CoV-2 RNA is generally detectable in upper and lower respiratory specimens during the acute phase of infection. The lowest concentration of SARS-CoV-2 viral copies this assay can detect is 250 copies / mL. A negative result does not preclude SARS-CoV-2 infection and should not be used as the sole basis for treatment or other patient  management decisions.  A negative result may occur with improper specimen collection / handling, submission of specimen other than nasopharyngeal swab, presence of viral mutation(s) within the areas targeted by this assay, and inadequate number of viral copies (<250 copies / mL). A negative result must be combined with clinical observations, patient history, and epidemiological information.  Fact Sheet for Patients:   RoadLapTop.co.za  Fact Sheet for Healthcare Providers: http://kim-miller.com/  This test is not yet approved or  cleared by the [place] FDA and has been authorized for detection and/or diagnosis of SARS-CoV-2 by FDA under an Emergency Use Authorization (EUA).  This EUA will remain in effect (meaning this test can be used) for the duration of the COVID-19 declaration under Section 564(b)(1) of the Act, 21 U.S.C. section 360bbb-3(b)(1), unless the authorization is terminated or revoked sooner.  Performed at [facility], [address] [place], [place] [address]   Respiratory (~20 pathogens) panel by PCR     Status: None   Collection Time: 04/18/23  1:27 PM   Specimen: Nasopharyngeal Swab; Respiratory  Result Value Ref Range Status   Adenovirus NOT DETECTED NOT DETECTED Final   Coronavirus 229E NOT DETECTED NOT DETECTED Final    Comment: (NOTE) The Coronavirus on the Respiratory Panel, DOES NOT test for the novel  Coronavirus (2019 nCoV)    Coronavirus HKU1 NOT DETECTED NOT DETECTED Final   Coronavirus NL63 NOT DETECTED NOT DETECTED Final   Coronavirus OC43 NOT DETECTED NOT DETECTED Final   Metapneumovirus NOT DETECTED NOT DETECTED Final   Rhinovirus / Enterovirus NOT DETECTED NOT DETECTED Final   Influenza A NOT DETECTED NOT DETECTED Final   Influenza B NOT DETECTED NOT DETECTED Final   Parainfluenza Virus 1 NOT DETECTED NOT DETECTED Final   Parainfluenza Virus 2 NOT DETECTED NOT DETECTED Final    Parainfluenza Virus 3 NOT DETECTED NOT DETECTED Final   Parainfluenza Virus 4 NOT DETECTED NOT DETECTED Final   Respiratory Syncytial Virus NOT DETECTED NOT DETECTED Final   Bordetella pertussis NOT DETECTED NOT DETECTED Final   Bordetella Parapertussis NOT DETECTED NOT DETECTED Final   Chlamydophila pneumoniae NOT DETECTED NOT DETECTED Final   Mycoplasma pneumoniae NOT DETECTED NOT DETECTED Final    Comment: Performed at [facility], [address] [place], [place] [address]  Urine Culture (for pregnant, neutropenic or urologic patients or patients  with an indwelling urinary catheter)     Status: Abnormal   Collection Time: 04/18/23  1:28 PM   Specimen: Urine, Clean Catch  Result Value Ref Range Status   Specimen Description   Final    URINE, CLEAN CATCH Performed at [facility], [address] [place], [place] [address]    Special Requests   Final    NONE Performed at [facility], [address] [place], [place] [address]    Culture MULTIPLE SPECIES PRESENT, SUGGEST RECOLLECTION (A)  Final   Report Status 04/19/2023 FINAL  Final  Expectorated Sputum Assessment w Gram Stain, Rflx to Resp Cult     Status: None   Collection Time: 04/19/23  2:00 PM   Specimen: Sputum  Result Value Ref Range Status   Specimen Description SPUTUM  Final   Special Requests NONE  Final   Sputum evaluation   Final    THIS SPECIMEN IS ACCEPTABLE FOR SPUTUM CULTURE Performed at [facility], [address] [place], [place] [address]    Report Status 04/19/2023 FINAL  Final  Culture, Respiratory w Gram Stain     Status: None (Preliminary result)   Collection Time: 04/19/23  2:00 PM   Specimen: SPU  Result Value Ref Range Status   Specimen Description   Final    SPUTUM Performed at [facility], [address], [place], [place] [address]    Special Requests   Final    NONE Reflexed from [id number] Performed at [facility], [address] [place], [place] [address]    Gram Stain   Final    FEW SQUAMOUS EPITHELIAL CELLS PRESENT FEW WBC PRESENT, PREDOMINANTLY MONONUCLEAR MODERATE GRAM POSITIVE COCCI IN PAIRS AND CHAINS MODERATE GRAM NEGATIVE RODS FEW YEAST    Culture   Final    CULTURE REINCUBATED FOR BETTER GROWTH Performed at [facility], [address], [place], [place] [address]    Report Status PENDING  Incomplete    Coagulation Studies: No results for input(s): "LABPROT", "INR" in the last 72 hours.  Urinalysis: No results for input(s): "COLORURINE", "LABSPEC", "PHURINE", "GLUCOSEU", "HGBUR", "BILIRUBINUR", "KETONESUR", "PROTEINUR", "UROBILINOGEN", "NITRITE", "LEUKOCYTESUR" in the last 72 hours.  Invalid input(s): "APPERANCEUR"     Imaging: No results found.   Medications:    cefTRIAXone (ROCEPHIN)  IV 1 g (04/21/23 1457)     amLODipine  10 mg Oral Daily   azelastine  1 spray Each Nare BID   dexamethasone  6 mg Oral Daily   feeding supplement  237 mL Oral BID BM   heparin  5,000 Units Subcutaneous Q8H   hydrALAZINE  100 mg Oral TID   levothyroxine  150 mcg Oral Daily   metoprolol tartrate  50 mg Oral BID   mometasone-formoterol  2 puff Inhalation BID   montelukast  10 mg Oral QHS   multivitamin with minerals  1 tablet Oral Daily   pantoprazole  40 mg Oral Daily   rosuvastatin  40 mg Oral Daily   sertraline  100 mg Oral Daily   acetaminophen, albuterol, fluticasone, guaiFENesin, hydrALAZINE, ipratropium-albuterol, loperamide, metoprolol tartrate, ondansetron (ZOFRAN) IV  Assessment/ Plan:  [name] is a 82 y.o.  female with diabetes mellitus type II, hypertension, COPD, hypothyroidism, allergies, chronic congestive heart failure, GERD, sleep apnea, hyperlipidemia who is admitted to [facility] on 04/16/2023 for Hypercalcemia [E83.52] Electrolyte abnormality [E87.8] AKI (acute kidney injury) (HCC) [N17.9]  Acute kidney injury on chronic kidney disease stage IV: baseline  creatinine of  1.92, GFR of 26 on 03/27/23. Renal US negative for obstruction. No IV contrast exposure. Acute kidney injury likely secondary to infection, UTI, and poor oral intake.  - Creatinine continues to improve, 3.44 - Tolerating meals, agree with stopping IVF - Will schedule outpatient appt with our office at discharge.   Hypercalcemia:  PTH of 17 on 04/02/23.  - Calcium 14.5 on admission.  - Holding all calcium supplements  - Calcium 10.2   Hypertension with chronic kidney disease: home regimen of spironolactone, metoprolol, hydralazine, amlodipine, torsemide.  - Continue holding torsemide . - Blood pressure elevated today.  - Agree with stopping IVF and will monitor.   Anemia of chronic kidney disease: hemoglobin 8.4. Normocytic.  Hgb slowly decreasing. May require ESA, will continue to monitor outpatient.    Diabetes mellitus type II with chronic kidney disease: well controlled with hemoglobin A1c of 5.9% on 01/22/23. Diet controlled   LOS: 5 Trae Bovenzi 7/21/202411:40 AM

## 2023-04-23 LAB — CULTURE, RESPIRATORY W GRAM STAIN

## 2023-05-24 ENCOUNTER — Other Ambulatory Visit (INDEPENDENT_AMBULATORY_CARE_PROVIDER_SITE_OTHER): Payer: Self-pay | Admitting: Nurse Practitioner

## 2023-05-24 DIAGNOSIS — N186 End stage renal disease: Secondary | ICD-10-CM

## 2023-05-28 ENCOUNTER — Ambulatory Visit (INDEPENDENT_AMBULATORY_CARE_PROVIDER_SITE_OTHER): Payer: Medicare Other

## 2023-05-28 ENCOUNTER — Ambulatory Visit (INDEPENDENT_AMBULATORY_CARE_PROVIDER_SITE_OTHER): Payer: Medicare Other | Admitting: Vascular Surgery

## 2023-05-28 ENCOUNTER — Encounter (INDEPENDENT_AMBULATORY_CARE_PROVIDER_SITE_OTHER): Payer: Self-pay | Admitting: Vascular Surgery

## 2023-05-28 VITALS — BP 144/58 | HR 58 | Resp 16 | Wt 176.4 lb

## 2023-05-28 DIAGNOSIS — N185 Chronic kidney disease, stage 5: Secondary | ICD-10-CM | POA: Diagnosis not present

## 2023-05-28 DIAGNOSIS — I10 Essential (primary) hypertension: Secondary | ICD-10-CM

## 2023-05-28 DIAGNOSIS — E1169 Type 2 diabetes mellitus with other specified complication: Secondary | ICD-10-CM

## 2023-05-28 DIAGNOSIS — E785 Hyperlipidemia, unspecified: Secondary | ICD-10-CM | POA: Diagnosis not present

## 2023-05-28 DIAGNOSIS — N186 End stage renal disease: Secondary | ICD-10-CM | POA: Diagnosis not present

## 2023-05-28 DIAGNOSIS — J449 Chronic obstructive pulmonary disease, unspecified: Secondary | ICD-10-CM | POA: Diagnosis not present

## 2023-05-28 DIAGNOSIS — E669 Obesity, unspecified: Secondary | ICD-10-CM

## 2023-05-30 ENCOUNTER — Encounter (INDEPENDENT_AMBULATORY_CARE_PROVIDER_SITE_OTHER): Payer: Self-pay | Admitting: Vascular Surgery

## 2023-05-30 NOTE — Progress Notes (Signed)
MRN : [id number]  [name] is a 82 y.o. (29-Jun-1941) female who presents with chief complaint of check access.  History of Present Illness:   The patient returns to the office for followup of their dialysis access.    The dialysis access was created on 05/19/2022 she is still not yet on dialysis.  Access is not being used.   The patient denies redness or swelling at the access site. The patient denies fever or chills at home or while on dialysis.   No recent shortening of the patient's walking distance or new symptoms consistent with claudication.  No history of rest pain symptoms. No new ulcers or wounds of the lower extremities have occurred.   The patient denies amaurosis fugax or recent TIA symptoms. There are no recent neurological changes noted. There is no history of DVT, PE or superficial thrombophlebitis. No recent episodes of angina or shortness of breath documented.    Duplex ultrasound of the AV access shows a patent access.  The previously noted stenosis is not significantly changed compared to last study.  Flow volume today is 1633 cc/min (previous flow volume was 1741 cc/min)  Current Meds  Medication Sig   acetaminophen (TYLENOL) 500 MG tablet Take 1,000 mg by mouth every 6 (six) hours as needed for mild pain or moderate pain.   albuterol (PROVENTIL HFA;VENTOLIN HFA) 108 (90 BASE) MCG/ACT inhaler Inhale 2 puffs into the lungs every 6 (six) hours as needed for wheezing or shortness of breath.   amLODipine (NORVASC) 10 MG tablet Take 0.5 tablets (5 mg total) by mouth daily.   azelastine (ASTELIN) 0.1 % nasal spray Place 1 spray into both nostrils 2 (two) times daily.   esomeprazole (NEXIUM) 40 MG capsule Take 40 mg by mouth daily before breakfast.   fluticasone (FLONASE) 50 MCG/ACT nasal spray Place 2 sprays into both nostrils daily.   fluticasone-salmeterol (ADVAIR) 500-50 MCG/ACT AEPB Inhale 1 puff into the lungs in the morning and at bedtime.    hydrALAZINE (APRESOLINE) 100 MG tablet Take 1 tablet (100 mg total) by mouth 3 (three) times daily.   ipratropium-albuterol (DUONEB) 0.5-2.5 (3) MG/3ML SOLN Take 3 mLs by nebulization every 6 (six) hours as needed (SOB).   levothyroxine (SYNTHROID) 150 MCG tablet Take 150 mcg by mouth daily.   loperamide (IMODIUM) 2 MG capsule Take 1 capsule (2 mg total) by mouth as needed for diarrhea or loose stools.   metoprolol tartrate (LOPRESSOR) 50 MG tablet Take 50 mg by mouth 2 (two) times daily.    montelukast (SINGULAIR) 10 MG tablet Take 10 mg by mouth at bedtime.   Multiple Minerals-Vitamins (CALCIUM & VIT D3 BONE HEALTH PO) Take 1 tablet by mouth daily. 600 mg/ 25 mg   rosuvastatin (CRESTOR) 40 MG tablet Take 40 mg by mouth daily.   sertraline (ZOLOFT) 100 MG tablet Take 100 mg by mouth daily.   torsemide (DEMADEX) 20 MG tablet Take 20 mg by mouth once.    Past Medical History:  Diagnosis Date   Actinic keratosis    Albuminuria    Anemia    Arthritis    Asthma    Basal cell carcinoma 04/12/2017   Above right lateral brow. Nodulocystic type. EDC   Cancer (HCC)    skin   Cataract cortical, senile    CHF (congestive heart failure) (HCC)    Diabetes mellitus without complication (  HCC)    GERD (gastroesophageal reflux disease)    Hemorrhoids    History of kidney stones    Hyperlipidemia    Hypertension    Hypothyroidism    Lyme disease    No kidney function    OSA (obstructive sleep apnea)    Osteoporosis    Osteoporosis    Reflux esophagitis    Steatohepatitis    Steatohepatitis     Past Surgical History:  Procedure Laterality Date   ABDOMINAL HYSTERECTOMY     APPENDECTOMY     AV FISTULA PLACEMENT Left 05/19/2022   Procedure: ARTERIOVENOUS (AV) FISTULA CREATION ( BRACHIAL CEPHALIC );  Surgeon: Renford Dills, MD;  Location: [facility] ORS;  Service: Vascular;  Laterality: Left;   CARDIAC CATHETERIZATION  1980   [facility]   CARDIAC CATHETERIZATION  08/13/2014   [facility]. no  significant CAD, normal LVEDP.    CATARACT EXTRACTION     CHOLECYSTECTOMY     COLONOSCOPY     COLONOSCOPY WITH PROPOFOL N/A 12/07/2016   Procedure: COLONOSCOPY WITH PROPOFOL;  Surgeon: Christena Deem, MD;  Location: [facility] ENDOSCOPY;  Service: Endoscopy;  Laterality: N/A;   ESOPHAGOGASTRODUODENOSCOPY     ESOPHAGOGASTRODUODENOSCOPY (EGD) WITH PROPOFOL N/A 12/07/2016   Procedure: ESOPHAGOGASTRODUODENOSCOPY (EGD) WITH PROPOFOL;  Surgeon: Christena Deem, MD;  Location: [facility] ENDOSCOPY;  Service: Endoscopy;  Laterality: N/A;   ESOPHAGOGASTRODUODENOSCOPY (EGD) WITH PROPOFOL N/A 01/07/2018   Procedure: ESOPHAGOGASTRODUODENOSCOPY (EGD) WITH PROPOFOL;  Surgeon: Christena Deem, MD;  Location: [facility] ENDOSCOPY;  Service: Endoscopy;  Laterality: N/A;   EYE SURGERY     HEMORRHOID SURGERY     PARTIAL HYSTERECTOMY     THYROIDECTOMY N/A 08/25/2019   Procedure: THYROIDECTOMY EXTRACTION OF SUBTOTAL COMPONENT; PARATHYROID AUTOTRANSPLANT X1;  Surgeon: Duanne Guess, MD;  Location: [facility] ORS;  Service: General;  Laterality: N/A;  With Nerve Monitoring(RLN)   TONSILLECTOMY      Social History Social History   Tobacco Use   Smoking status: Never   Smokeless tobacco: Never  Vaping Use   Vaping status: Never Used  Substance Use Topics   Alcohol use: No   Drug use: No    Family History Family History  Problem Relation Age of Onset   Breast cancer Mother    Heart attack Father     Allergies  Allergen Reactions   Ace Inhibitors     Other reaction(s): Unknown   Egg-Derived Products Diarrhea   Other     Other reaction(s): Other (See Comments) Eggs   Prednisone     Other reaction(s): Other (See Comments) joint pain   Risedronate     Other reaction(s): Other (See Comments)   Sulfa Antibiotics Itching and Swelling    Other reaction(s): Other (See Comments)   Sulfasalazine Other (See Comments)     REVIEW OF SYSTEMS (Negative unless checked)  Constitutional: [] Weight loss  [] Fever   [] Chills Cardiac: [] Chest pain   [] Chest pressure   [] Palpitations   [] Shortness of breath when laying flat   [] Shortness of breath with exertion. Vascular:  [] Pain in legs with walking   [] Pain in legs at rest  [] History of DVT   [] Phlebitis   [] Swelling in legs   [] Varicose veins   [] Non-healing ulcers Pulmonary:   [] Uses home oxygen   [] Productive cough   [] Hemoptysis   [] Wheeze  [] COPD   [] Asthma Neurologic:  [] Dizziness   [] Seizures   [] History of stroke   [] History of TIA  [] Aphasia   [] Vissual changes   [] Weakness or numbness in  arm   [] Weakness or numbness in leg Musculoskeletal:   [] Joint swelling   [] Joint pain   [] Low back pain Hematologic:  [] Easy bruising  [] Easy bleeding   [] Hypercoagulable state   [] Anemic Gastrointestinal:  [] Diarrhea   [] Vomiting  [] Gastroesophageal reflux/heartburn   [] Difficulty swallowing. Genitourinary:  [x] Chronic kidney disease   [] Difficult urination  [] Frequent urination   [] Blood in urine Skin:  [] Rashes   [] Ulcers  Psychological:  [] History of anxiety   []  History of major depression.  Physical Examination  Vitals:   05/28/23 1342  BP: (!) 144/58  Pulse: (!) 58  Resp: 16  Weight: 176 lb 6.4 oz (80 kg)   Body mass index is 32.26 kg/m. Gen: WD/WN, NAD Head: [place]/AT, No temporalis wasting.  Ear/Nose/Throat: Hearing grossly intact, nares w/o erythema or drainage Eyes: PER, EOMI, sclera nonicteric.  Neck: Supple, no gross masses or lesions.  No JVD.  Pulmonary:  Good air movement, no audible wheezing, no use of accessory muscles.  Cardiac: RRR, precordium non-hyperdynamic. Vascular:   Left brachiocephalic fistula good thrill good bruit easily palpable Vessel Right Left  Radial Palpable Palpable  Brachial Palpable Palpable  Gastrointestinal: soft, non-distended. No guarding/no peritoneal signs.  Musculoskeletal: M/S 5/5 throughout.  No deformity.  Neurologic: CN 2-12 intact. Pain and light touch intact in extremities.  Symmetrical.  Speech is  fluent. Motor exam as listed above. Psychiatric: Judgment intact, Mood & affect appropriate for pt's clinical situation. Dermatologic: No rashes or ulcers noted.  No changes consistent with cellulitis.   CBC Lab Results  Component Value Date   WBC 5.6 04/22/2023   HGB 8.4 (L) 04/22/2023   HCT 25.9 (L) 04/22/2023   MCV 86.0 04/22/2023   PLT 139 (L) 04/22/2023    BMET    Component Value Date/Time   NA 139 04/22/2023 0436   NA 141 08/11/2014 0850   NA 142 06/20/2012 1353   K 4.4 04/22/2023 0436   K 3.2 (L) 06/20/2012 1353   CL 111 04/22/2023 0436   CL 103 06/20/2012 1353   CO2 21 (L) 04/22/2023 0436   CO2 29 06/20/2012 1353   GLUCOSE 96 04/22/2023 0436   GLUCOSE 84 06/20/2012 1353   BUN 59 (H) 04/22/2023 0436   BUN 16 08/11/2014 0850   BUN 13 06/20/2012 1353   CREATININE 3.44 (H) 04/22/2023 0436   CREATININE 0.65 06/20/2012 1353   CALCIUM 10.2 04/22/2023 0436   CALCIUM 9.4 06/20/2012 1353   GFRNONAA 13 (L) 04/22/2023 0436   GFRNONAA >60 06/20/2012 1353   GFRAA >60 12/31/2016 0318   GFRAA >60 06/20/2012 1353   CrCl cannot be calculated (Patient's most recent lab result is older than the maximum 21 days allowed.).  COAG Lab Results  Component Value Date   INR 1.1 08/11/2014    Radiology No results found.   Assessment/Plan 1. CKD (chronic kidney disease) stage 5, GFR less than 15 ml/min (HCC) Recommend:   The patient is doing well and currently has adequate dialysis access. The patient is not yet on dialysis.   The patient should have a duplex ultrasound of the dialysis access in 6 months. The patient will follow-up with me in the office after each ultrasound  - VAS US DUPLEX DIALYSIS ACCESS (AVF, AVG); Future  2. Hyperlipidemia, unspecified hyperlipidemia type Continue statin as ordered and reviewed, no changes at this time  3. Chronic obstructive pulmonary disease, unspecified COPD type (HCC) Continue pulmonary medications and aerosols as already  ordered, these medications have been reviewed and  there are no changes at this time.   4. Essential hypertension Continue antihypertensive medications as already ordered, these medications have been reviewed and there are no changes at this time.  5. Type 2 diabetes mellitus with obesity (HCC) Continue hypoglycemic medications as already ordered, these medications have been reviewed and there are no changes at this time.  Hgb A1C to be monitored as already arranged by primary service    Levora Dredge, MD  05/30/2023 3:24 PM

## 2023-06-01 ENCOUNTER — Inpatient Hospital Stay
Admission: EM | Admit: 2023-06-01 | Discharge: 2023-06-04 | DRG: 291 | Disposition: A | Discharge status: Home Health | Payer: Medicare Other | Attending: Student | Admitting: Student

## 2023-06-01 ENCOUNTER — Other Ambulatory Visit: Payer: Self-pay

## 2023-06-01 ENCOUNTER — Emergency Department: Payer: Medicare Other

## 2023-06-01 DIAGNOSIS — N185 Chronic kidney disease, stage 5: Secondary | ICD-10-CM | POA: Diagnosis not present

## 2023-06-01 DIAGNOSIS — Z91012 Allergy to eggs: Secondary | ICD-10-CM

## 2023-06-01 DIAGNOSIS — Z79890 Hormone replacement therapy (postmenopausal): Secondary | ICD-10-CM | POA: Diagnosis not present

## 2023-06-01 DIAGNOSIS — Z882 Allergy status to sulfonamides status: Secondary | ICD-10-CM

## 2023-06-01 DIAGNOSIS — Z9089 Acquired absence of other organs: Secondary | ICD-10-CM

## 2023-06-01 DIAGNOSIS — J449 Chronic obstructive pulmonary disease, unspecified: Secondary | ICD-10-CM | POA: Diagnosis present

## 2023-06-01 DIAGNOSIS — Z7951 Long term (current) use of inhaled steroids: Secondary | ICD-10-CM

## 2023-06-01 DIAGNOSIS — E1122 Type 2 diabetes mellitus with diabetic chronic kidney disease: Secondary | ICD-10-CM | POA: Diagnosis present

## 2023-06-01 DIAGNOSIS — I472 Ventricular tachycardia, unspecified: Secondary | ICD-10-CM | POA: Diagnosis present

## 2023-06-01 DIAGNOSIS — I480 Paroxysmal atrial fibrillation: Secondary | ICD-10-CM

## 2023-06-01 DIAGNOSIS — I5033 Acute on chronic diastolic (congestive) heart failure: Secondary | ICD-10-CM | POA: Diagnosis present

## 2023-06-01 DIAGNOSIS — D631 Anemia in chronic kidney disease: Secondary | ICD-10-CM | POA: Diagnosis not present

## 2023-06-01 DIAGNOSIS — Z79899 Other long term (current) drug therapy: Secondary | ICD-10-CM

## 2023-06-01 DIAGNOSIS — G4733 Obstructive sleep apnea (adult) (pediatric): Secondary | ICD-10-CM | POA: Diagnosis not present

## 2023-06-01 DIAGNOSIS — I272 Pulmonary hypertension, unspecified: Secondary | ICD-10-CM | POA: Diagnosis present

## 2023-06-01 DIAGNOSIS — E890 Postprocedural hypothyroidism: Secondary | ICD-10-CM | POA: Diagnosis present

## 2023-06-01 DIAGNOSIS — Z6831 Body mass index (BMI) 31.0-31.9, adult: Secondary | ICD-10-CM

## 2023-06-01 DIAGNOSIS — K7581 Nonalcoholic steatohepatitis (NASH): Secondary | ICD-10-CM | POA: Diagnosis present

## 2023-06-01 DIAGNOSIS — I132 Hypertensive heart and chronic kidney disease with heart failure and with stage 5 chronic kidney disease, or end stage renal disease: Secondary | ICD-10-CM | POA: Diagnosis present

## 2023-06-01 DIAGNOSIS — F32A Depression, unspecified: Secondary | ICD-10-CM | POA: Diagnosis present

## 2023-06-01 DIAGNOSIS — Z803 Family history of malignant neoplasm of breast: Secondary | ICD-10-CM

## 2023-06-01 DIAGNOSIS — E876 Hypokalemia: Secondary | ICD-10-CM | POA: Diagnosis present

## 2023-06-01 DIAGNOSIS — Z8249 Family history of ischemic heart disease and other diseases of the circulatory system: Secondary | ICD-10-CM | POA: Diagnosis not present

## 2023-06-01 DIAGNOSIS — M10041 Idiopathic gout, right hand: Secondary | ICD-10-CM | POA: Diagnosis present

## 2023-06-01 DIAGNOSIS — Z888 Allergy status to other drugs, medicaments and biological substances status: Secondary | ICD-10-CM

## 2023-06-01 DIAGNOSIS — Z85828 Personal history of other malignant neoplasm of skin: Secondary | ICD-10-CM

## 2023-06-01 DIAGNOSIS — E669 Obesity, unspecified: Secondary | ICD-10-CM | POA: Diagnosis present

## 2023-06-01 DIAGNOSIS — E782 Mixed hyperlipidemia: Secondary | ICD-10-CM | POA: Diagnosis present

## 2023-06-01 DIAGNOSIS — Z7901 Long term (current) use of anticoagulants: Secondary | ICD-10-CM

## 2023-06-01 DIAGNOSIS — J4489 Other specified chronic obstructive pulmonary disease: Secondary | ICD-10-CM | POA: Diagnosis present

## 2023-06-01 DIAGNOSIS — N2581 Secondary hyperparathyroidism of renal origin: Secondary | ICD-10-CM | POA: Diagnosis present

## 2023-06-01 DIAGNOSIS — M810 Age-related osteoporosis without current pathological fracture: Secondary | ICD-10-CM | POA: Diagnosis present

## 2023-06-01 DIAGNOSIS — I10 Essential (primary) hypertension: Secondary | ICD-10-CM | POA: Diagnosis not present

## 2023-06-01 LAB — BASIC METABOLIC PANEL
Anion gap: 15 (ref 5–15)
BUN: 70 mg/dL — ABNORMAL HIGH (ref 8–23)
CO2: 21 mmol/L — ABNORMAL LOW (ref 22–32)
Calcium: 7 mg/dL — ABNORMAL LOW (ref 8.9–10.3)
Chloride: 102 mmol/L (ref 98–111)
Creatinine, Ser: 2.26 mg/dL — ABNORMAL HIGH (ref 0.44–1.00)
GFR, Estimated: 21 mL/min — ABNORMAL LOW (ref >60)
Glucose, Bld: 87 mg/dL (ref 70–99)
Potassium: 3.6 mmol/L (ref 3.5–5.1)
Sodium: 138 mmol/L (ref 135–145)

## 2023-06-01 LAB — CBC
HCT: 29.1 % — ABNORMAL LOW (ref 36.0–46.0)
Hemoglobin: 9.2 g/dL — ABNORMAL LOW (ref 12.0–15.0)
MCH: 27.2 pg (ref 26.0–34.0)
MCHC: 31.6 g/dL (ref 30.0–36.0)
MCV: 86.1 fL (ref 80.0–100.0)
Platelets: 186 10*3/uL (ref 150–400)
RBC: 3.38 MIL/uL — ABNORMAL LOW (ref 3.87–5.11)
RDW: 15.5 % (ref 11.5–15.5)
WBC: 9 10*3/uL (ref 4.0–10.5)
nRBC: 0 % (ref 0.0–0.2)

## 2023-06-01 LAB — PHOSPHORUS: Phosphorus: 3.3 mg/dL (ref 2.5–4.6)

## 2023-06-01 LAB — TROPONIN I (HIGH SENSITIVITY)
Troponin I (High Sensitivity): 35 ng/L — ABNORMAL HIGH (ref <18)
Troponin I (High Sensitivity): 36 ng/L — ABNORMAL HIGH (ref <18)

## 2023-06-01 LAB — MAGNESIUM: Magnesium: 0.9 mg/dL — CL (ref 1.7–2.4)

## 2023-06-01 LAB — BRAIN NATRIURETIC PEPTIDE: B Natriuretic Peptide: 558.8 pg/mL — ABNORMAL HIGH (ref 0.0–100.0)

## 2023-06-01 MED ORDER — ALBUTEROL SULFATE (2.5 MG/3ML) 0.083% IN NEBU: 2.5000 mg | INHALATION_SOLUTION | RESPIRATORY_TRACT | Status: DC | PRN

## 2023-06-01 MED ORDER — MOMETASONE FURO-FORMOTEROL FUM 200-5 MCG/ACT IN AERO
2.0000 | INHALATION_SPRAY | Freq: Two times a day (BID) | RESPIRATORY_TRACT | Status: DC
  Administered 2023-06-02 – 2023-06-04 (×5): 2 via RESPIRATORY_TRACT
  Filled 2023-06-01: qty 8.8

## 2023-06-01 MED ORDER — FUROSEMIDE 10 MG/ML IJ SOLN
40.0000 mg | Freq: Once | INTRAMUSCULAR | Status: AC
  Administered 2023-06-01: 40 mg via INTRAVENOUS
  Filled 2023-06-01: qty 4

## 2023-06-01 MED ORDER — PANTOPRAZOLE SODIUM 40 MG PO TBEC
40.0000 mg | DELAYED_RELEASE_TABLET | Freq: Every day | ORAL | Status: DC
  Administered 2023-06-02 – 2023-06-04 (×3): 40 mg via ORAL
  Filled 2023-06-01 (×3): qty 1

## 2023-06-01 MED ORDER — CALCIUM GLUCONATE-NACL 1-0.675 GM/50ML-% IV SOLN
1.0000 g | Freq: Once | INTRAVENOUS | Status: AC
  Administered 2023-06-01: 1000 mg via INTRAVENOUS
  Filled 2023-06-01: qty 50

## 2023-06-01 MED ORDER — ONDANSETRON HCL 4 MG PO TABS: 4.0000 mg | ORAL_TABLET | Freq: Four times a day (QID) | ORAL | Status: DC | PRN

## 2023-06-01 MED ORDER — ACETAMINOPHEN 325 MG PO TABS
650.0000 mg | ORAL_TABLET | Freq: Four times a day (QID) | ORAL | Status: DC | PRN
  Administered 2023-06-04: 650 mg via ORAL
  Filled 2023-06-01: qty 2

## 2023-06-01 MED ORDER — METOPROLOL TARTRATE 50 MG PO TABS
50.0000 mg | ORAL_TABLET | Freq: Two times a day (BID) | ORAL | Status: DC
  Administered 2023-06-01 – 2023-06-04 (×5): 50 mg via ORAL
  Filled 2023-06-01 (×6): qty 1

## 2023-06-01 MED ORDER — LOPERAMIDE HCL 2 MG PO CAPS: 2.0000 mg | ORAL_CAPSULE | ORAL | Status: DC | PRN

## 2023-06-01 MED ORDER — SERTRALINE HCL 50 MG PO TABS
100.0000 mg | ORAL_TABLET | Freq: Every day | ORAL | Status: DC
  Administered 2023-06-02 – 2023-06-04 (×3): 100 mg via ORAL
  Filled 2023-06-01 (×3): qty 2

## 2023-06-01 MED ORDER — ROSUVASTATIN CALCIUM 20 MG PO TABS
40.0000 mg | ORAL_TABLET | Freq: Every day | ORAL | Status: DC
  Administered 2023-06-02 – 2023-06-04 (×3): 40 mg via ORAL
  Filled 2023-06-01 (×3): qty 2

## 2023-06-01 MED ORDER — ONDANSETRON HCL 4 MG/2ML IJ SOLN: 4.0000 mg | Freq: Four times a day (QID) | INTRAMUSCULAR | Status: DC | PRN

## 2023-06-01 MED ORDER — MAGNESIUM SULFATE 2 GM/50ML IV SOLN
2.0000 g | Freq: Once | INTRAVENOUS | Status: AC
  Administered 2023-06-01: 2 g via INTRAVENOUS
  Filled 2023-06-01: qty 50

## 2023-06-01 MED ORDER — ACETAMINOPHEN 650 MG RE SUPP: 650.0000 mg | Freq: Four times a day (QID) | RECTAL | Status: DC | PRN

## 2023-06-01 MED ORDER — HYDRALAZINE HCL 50 MG PO TABS
100.0000 mg | ORAL_TABLET | Freq: Three times a day (TID) | ORAL | Status: DC
  Administered 2023-06-01 – 2023-06-04 (×7): 100 mg via ORAL
  Filled 2023-06-01 (×8): qty 2

## 2023-06-01 MED ORDER — FUROSEMIDE 10 MG/ML IJ SOLN
40.0000 mg | Freq: Two times a day (BID) | INTRAMUSCULAR | Status: DC
  Administered 2023-06-01 – 2023-06-03 (×4): 40 mg via INTRAVENOUS
  Filled 2023-06-01 (×4): qty 4

## 2023-06-01 MED ORDER — HYDRALAZINE HCL 20 MG/ML IJ SOLN
5.0000 mg | INTRAMUSCULAR | Status: DC | PRN
  Filled 2023-06-01: qty 1

## 2023-06-01 MED ORDER — LEVOTHYROXINE SODIUM 50 MCG PO TABS
150.0000 ug | ORAL_TABLET | Freq: Every day | ORAL | Status: DC
  Administered 2023-06-02 – 2023-06-04 (×3): 150 ug via ORAL
  Filled 2023-06-01 (×3): qty 1

## 2023-06-01 MED ORDER — ENOXAPARIN SODIUM 30 MG/0.3ML IJ SOSY
30.0000 mg | PREFILLED_SYRINGE | INTRAMUSCULAR | Status: DC
  Administered 2023-06-01 – 2023-06-02 (×2): 30 mg via SUBCUTANEOUS
  Filled 2023-06-01 (×2): qty 0.3

## 2023-06-01 NOTE — Assessment & Plan Note (Signed)
Received magnesium sulfate IV piggyback in the ED Pharmacy consult for electrolyte management

## 2023-06-01 NOTE — ED Triage Notes (Signed)
Arrives from [facility] for ED evaluaiton. C/O CP and SOB, dizziness, blurred vision.  Hx CHF and Asthma.  Also right hand swelling.  VS wnl.

## 2023-06-01 NOTE — H&P (Signed)
History and Physical    Patient: [name] ZOX:[id number] DOB: [birth year] DOA: 06/01/2023 DOS: the patient was seen and examined on 06/01/2023 PCP: Lauro Regulus, MD  Patient coming from: Home  Chief Complaint:  Chief Complaint  Patient presents with   Chest Pain    HPI: [name] is a 82 y.o. 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  She has intermittent chest discomfort.  She denies nausea, vomiting, palpitations lightheadedness.  She has no cough fever or chills. ED course and data review: Mild tachypnea to 22 with O2 sat 98% on room air, low-grade temp of 99, BP 149/63.  Labs notable for troponin 36 and BNP 558 Creatinine 2.26 with bicarb 21,  calcium 7 and magnesium 0.9, normal potassium and phosphorus Hemoglobin 9.2 which is her baseline EKG, personally reviewed and interpreted showing NSR at 72 with no acute ST-T wave changes Chest x-ray showing  IMPRESSION: Enlarged cardiopericardial silhouette with interstitial changes. Acute process is possible. Recommend follow-up  Patient treated with IV Lasix given calcium gluconate IVPB and magnesium sulfate IVPB Hospitalist consulted for admission.   Review of Systems: As mentioned in the history of present illness. All other systems reviewed and are negative.  Past Medical History:  Diagnosis Date   Actinic keratosis    Albuminuria    Anemia    Arthritis    Asthma    Basal cell carcinoma 04/12/2017   Above right lateral brow. Nodulocystic type. EDC   Cancer (HCC)    skin   Cataract cortical, senile    CHF (congestive heart failure) (HCC)    Diabetes mellitus without complication (HCC)    GERD  (gastroesophageal reflux disease)    Hemorrhoids    History of kidney stones    Hyperlipidemia    Hypertension    Hypothyroidism    Lyme disease    No kidney function    OSA (obstructive sleep apnea)    Osteoporosis    Osteoporosis    Reflux esophagitis    Steatohepatitis    Steatohepatitis    Past Surgical History:  Procedure Laterality Date   ABDOMINAL HYSTERECTOMY     APPENDECTOMY     AV FISTULA PLACEMENT Left 05/19/2022   Procedure: ARTERIOVENOUS (AV) FISTULA CREATION ( BRACHIAL CEPHALIC );  Surgeon: Renford Dills, MD;  Location: [facility] ORS;  Service: Vascular;  Laterality: Left;   CARDIAC CATHETERIZATION  1980   [facility]   CARDIAC CATHETERIZATION  08/13/2014   [facility]. no significant CAD, normal LVEDP.    CATARACT EXTRACTION     CHOLECYSTECTOMY     COLONOSCOPY     COLONOSCOPY WITH PROPOFOL N/A 12/07/2016   Procedure: COLONOSCOPY WITH PROPOFOL;  Surgeon: Christena Deem, MD;  Location: [facility] ENDOSCOPY;  Service: Endoscopy;  Laterality: N/A;   ESOPHAGOGASTRODUODENOSCOPY     ESOPHAGOGASTRODUODENOSCOPY (EGD) WITH PROPOFOL N/A 12/07/2016   Procedure: ESOPHAGOGASTRODUODENOSCOPY (EGD) WITH PROPOFOL;  Surgeon: Christena Deem, MD;  Location: [facility] ENDOSCOPY;  Service: Endoscopy;  Laterality: N/A;   ESOPHAGOGASTRODUODENOSCOPY (EGD) WITH PROPOFOL N/A 01/07/2018   Procedure: ESOPHAGOGASTRODUODENOSCOPY (EGD) WITH PROPOFOL;  Surgeon: Christena Deem, MD;  Location: [facility] ENDOSCOPY;  Service: Endoscopy;  Laterality: N/A;   EYE SURGERY  HEMORRHOID SURGERY     PARTIAL HYSTERECTOMY     THYROIDECTOMY N/A 08/25/2019   Procedure: THYROIDECTOMY EXTRACTION OF SUBTOTAL COMPONENT; PARATHYROID AUTOTRANSPLANT X1;  Surgeon: Duanne Guess, MD;  Location: [facility] ORS;  Service: General;  Laterality: N/A;  With Nerve Monitoring(RLN)   TONSILLECTOMY     Social History:  reports that she has never smoked. She has never used smokeless tobacco. She reports that she does not drink alcohol and does not use  drugs.  Allergies  Allergen Reactions   Ace Inhibitors     Other reaction(s): Unknown   Egg-Derived Products Diarrhea   Other     Other reaction(s): Other (See Comments) Eggs   Prednisone     Other reaction(s): Other (See Comments) joint pain   Risedronate     Other reaction(s): Other (See Comments)   Sulfa Antibiotics Itching and Swelling    Other reaction(s): Other (See Comments)   Sulfasalazine Other (See Comments)    Family History  Problem Relation Age of Onset   Breast cancer Mother    Heart attack Father     Prior to Admission medications   Medication Sig Start Date End Date Taking? Authorizing Provider  acetaminophen (TYLENOL) 500 MG tablet Take 1,000 mg by mouth every 6 (six) hours as needed for mild pain or moderate pain.    [provider]  albuterol (PROVENTIL HFA;VENTOLIN HFA) 108 (90 BASE) MCG/ACT inhaler Inhale 2 puffs into the lungs every 6 (six) hours as needed for wheezing or shortness of breath.    [provider]  amLODipine (NORVASC) 10 MG tablet Take 0.5 tablets (5 mg total) by mouth daily. 04/22/23   Wouk, Wilfred Curtis, MD  azelastine (ASTELIN) 0.1 % nasal spray Place 1 spray into both nostrils 2 (two) times daily.    [provider]  esomeprazole (NEXIUM) 40 MG capsule Take 40 mg by mouth daily before breakfast.    [provider]  fluticasone (FLONASE) 50 MCG/ACT nasal spray Place 2 sprays into both nostrils daily.    [provider]  fluticasone-salmeterol (ADVAIR) 500-50 MCG/ACT AEPB Inhale 1 puff into the lungs in the morning and at bedtime.    [provider]  hydrALAZINE (APRESOLINE) 100 MG tablet Take 1 tablet (100 mg total) by mouth 3 (three) times daily. 04/22/23   Wouk, Wilfred Curtis, MD  ipratropium-albuterol (DUONEB) 0.5-2.5 (3) MG/3ML SOLN Take 3 mLs by nebulization every 6 (six) hours as needed (SOB). 03/27/23   Arnetha Courser, MD  levothyroxine (SYNTHROID) 150 MCG tablet Take 150 mcg by mouth  daily.    [provider]  lidocaine (LIDODERM) 5 % Place 1 patch onto the skin every 12 (twelve) hours. Remove & Discard patch within 12 hours or as directed by MD Patient not taking: Reported on 07/10/2022 06/08/22   Sharman Cheek, MD  loperamide (IMODIUM) 2 MG capsule Take 1 capsule (2 mg total) by mouth as needed for diarrhea or loose stools. 08/05/22   Arnetha Courser, MD  metoprolol tartrate (LOPRESSOR) 50 MG tablet Take 50 mg by mouth 2 (two) times daily.     [provider]  montelukast (SINGULAIR) 10 MG tablet Take 10 mg by mouth at bedtime.    [provider]  Multiple Minerals-Vitamins (CALCIUM & VIT D3 BONE HEALTH PO) Take 1 tablet by mouth daily. 600 mg/ 25 mg    [provider]  rosuvastatin (CRESTOR) 40 MG tablet Take 40 mg by mouth daily.    [provider]  sertraline (ZOLOFT) 100  MG tablet Take 100 mg by mouth daily.    [provider]  torsemide (DEMADEX) 20 MG tablet Take 20 mg by mouth once. 05/09/23 05/08/24  [provider]    Physical Exam: Vitals:   06/01/23 1300 06/01/23 1519 06/01/23 1548 06/01/23 1900  BP: (!) 149/63 (!) 154/57 (!) 148/62 (!) 161/60  Pulse: 74 68 72 67  Resp: (!) 21 20 16  (!) 22  Temp: 99 F (37.2 C) 98.4 F (36.9 C)    TempSrc: Oral Oral    SpO2: 98% 94% 95% 96%   Physical Exam Vitals and nursing note reviewed.  Constitutional:      General: She is not in acute distress. HENT:     Head: Normocephalic and atraumatic.  Cardiovascular:     Rate and Rhythm: Normal rate and regular rhythm.     Heart sounds: Normal heart sounds.  Pulmonary:     Effort: Pulmonary effort is normal.     Breath sounds: Normal breath sounds.  Abdominal:     Palpations: Abdomen is soft.     Tenderness: There is no abdominal tenderness.  Musculoskeletal:     Right lower leg: Edema present.     Left lower leg: Edema present.  Neurological:     Mental Status: Mental status is at baseline.     Labs on  Admission: I have personally reviewed following labs and imaging studies  CBC: Recent Labs  Lab 06/01/23 1300  WBC 9.0  HGB 9.2*  HCT 29.1*  MCV 86.1  PLT 186   Basic Metabolic Panel: Recent Labs  Lab 06/01/23 1300 06/01/23 1529  NA 138  --   K 3.6  --   CL 102  --   CO2 21*  --   GLUCOSE 87  --   BUN 70*  --   CREATININE 2.26*  --   CALCIUM 7.0*  --   MG  --  0.9*  PHOS  --  3.3   GFR: Estimated Creatinine Clearance: 18.8 mL/min (A) (by C-G formula based on SCr of 2.26 mg/dL (H)). Liver Function Tests: No results for input(s): "AST", "ALT", "ALKPHOS", "BILITOT", "PROT", "ALBUMIN" in the last 168 hours. No results for input(s): "LIPASE", "AMYLASE" in the last 168 hours. No results for input(s): "AMMONIA" in the last 168 hours. Coagulation Profile: No results for input(s): "INR", "PROTIME" in the last 168 hours. Cardiac Enzymes: No results for input(s): "CKTOTAL", "CKMB", "CKMBINDEX", "TROPONINI" in the last 168 hours. BNP (last 3 results) No results for input(s): "PROBNP" in the last 8760 hours. HbA1C: No results for input(s): "HGBA1C" in the last 72 hours. CBG: No results for input(s): "GLUCAP" in the last 168 hours. Lipid Profile: No results for input(s): "CHOL", "HDL", "LDLCALC", "TRIG", "CHOLHDL", "LDLDIRECT" in the last 72 hours. Thyroid Function Tests: No results for input(s): "TSH", "T4TOTAL", "FREET4", "T3FREE", "THYROIDAB" in the last 72 hours. Anemia Panel: No results for input(s): "VITAMINB12", "FOLATE", "FERRITIN", "TIBC", "IRON", "RETICCTPCT" in the last 72 hours. Urine analysis:    Component Value Date/Time   COLORURINE YELLOW (A) 04/18/2023 1327   APPEARANCEUR TURBID (A) 04/18/2023 1327   APPEARANCEUR Clear 09/17/2018 1028   LABSPEC 1.010 04/18/2023 1327   LABSPEC 1.013 06/20/2012 1508   PHURINE 7.0 04/18/2023 1327   GLUCOSEU NEGATIVE 04/18/2023 1327   GLUCOSEU Negative 06/20/2012 1508   HGBUR SMALL (A) 04/18/2023 1327   BILIRUBINUR  NEGATIVE 04/18/2023 1327   BILIRUBINUR Negative 09/17/2018 1028   BILIRUBINUR Negative 06/20/2012 1508   KETONESUR NEGATIVE 04/18/2023 1327  PROTEINUR 100 (A) 04/18/2023 1327   NITRITE NEGATIVE 04/18/2023 1327   LEUKOCYTESUR LARGE (A) 04/18/2023 1327   LEUKOCYTESUR Negative 06/20/2012 1508    Radiological Exams on Admission: DG Chest 2 View  Result Date: 06/01/2023 CLINICAL DATA:  Chest pain EXAM: CHEST - 2 VIEW COMPARISON:  X-ray 04/16/2023 and older.  CT 04/18/2023 FINDINGS: Enlarged cardiopericardial silhouette. Calcified aorta. Interstitial changes. No pneumothorax or effusion. No consolidation. Osteopenia. Degenerative changes IMPRESSION: Enlarged cardiopericardial silhouette with interstitial changes. Acute process is possible. Recommend follow-up. Electronically Signed   By: Karen Kays M.D.   On: 06/01/2023 13:42     Data Reviewed: Relevant notes from primary care and specialist visits, past discharge summaries as available in EHR, including Care Everywhere. Prior diagnostic testing as pertinent to current admission diagnoses Updated medications and problem lists for reconciliation ED course, including vitals, labs, imaging, treatment and response to treatment Triage notes, nursing and pharmacy notes and ED provider's notes Notable results as noted in HPI   Assessment and Plan: * Acute on chronic diastolic CHF (congestive heart failure) (HCC) Patient with dyspnea on exertion and lower extremity edema Elevated BNP over 500 and chest x-ray showed the following "Enlarged cardiopericardial silhouette with interstitial changes. Acute process is possible. Recommend follow-up" IV Lasix Continue metoprolol and hydralazine Daily weights with intake and output monitoring Last echo 04/19/2023 showed EF 60 to 65% Can consider limited echo to evaluate pericardial fluid but will await treatment response and nephrology consult  Hypomagnesemia Received magnesium sulfate IV piggyback in  the ED Pharmacy consult for electrolyte management  Hypocalcemia, symptomatic Received calcium gluconate in the ED Pharmacy consult for electrolyte management  COPD (chronic obstructive pulmonary disease) (HCC) Not acutely exacerbated Continue home inhalers with DuoNebs as needed  CKD (chronic kidney disease) stage 5, GFR less than 15 ml/min (HCC) Anemia of CKD 5 Secondary hyperparathyroidism Renal consult requested  OSA on CPAP Continue CPAP nightly  S/P total thyroidectomy Continue levothyroxine 150 mcg daily        DVT prophylaxis: Lovenox  Consults: renal  Advance Care Planning:   Code Status: Prior   Family Communication: none  Disposition Plan: Back to previous home environment  Severity of Illness: The appropriate patient status for this patient is INPATIENT. Inpatient status is judged to be reasonable and necessary in order to provide the required intensity of service to ensure the patient's safety. The patient's presenting symptoms, physical exam findings, and initial radiographic and laboratory data in the context of their chronic comorbidities is felt to place them at high risk for further clinical deterioration. Furthermore, it is not anticipated that the patient will be medically stable for discharge from the hospital within 2 midnights of admission.   * I certify that at the point of admission it is my clinical judgment that the patient will require inpatient hospital care spanning beyond 2 midnights from the point of admission due to high intensity of service, high risk for further deterioration and high frequency of surveillance required.*  Author: Andris Baumann, MD 06/01/2023 7:35 PM  For on call review [email or web address].

## 2023-06-01 NOTE — Assessment & Plan Note (Addendum)
Patient with dyspnea on exertion and lower extremity edema Elevated BNP over 500 and chest x-ray showed the following "Enlarged cardiopericardial silhouette with interstitial changes. Acute process is possible. Recommend follow-up" IV Lasix Continue metoprolol and hydralazine Daily weights with intake and output monitoring Last echo 04/19/2023 showed EF 60 to 65% Can consider limited echo to evaluate pericardial fluid but will await treatment response and nephrology consult

## 2023-06-01 NOTE — Assessment & Plan Note (Signed)
Not acutely exacerbated ?Continue home inhalers with DuoNebs as needed ?

## 2023-06-01 NOTE — Assessment & Plan Note (Signed)
Anemia of CKD 5 Secondary hyperparathyroidism Renal consult requested

## 2023-06-01 NOTE — Assessment & Plan Note (Signed)
Continue CPAP nightly. °

## 2023-06-01 NOTE — Assessment & Plan Note (Signed)
Received calcium gluconate in the ED Pharmacy consult for electrolyte management

## 2023-06-01 NOTE — Assessment & Plan Note (Signed)
Continue levothyroxine 150 mcg daily

## 2023-06-01 NOTE — ED Provider Notes (Signed)
[facility] Provider Note    Event Date/Time   First MD Initiated Contact with Patient 06/01/23 1658     (approximate)   History   Chief Complaint Shortness of Breath  HPI  [name] is a 82 y.o. female with past medical history of hypertension, hyperlipidemia, diabetes, CHF, CKD, and anemia who presents to the ED complaining of shortness of breath.  Patient reports that she has had 2 days of increasing difficulty breathing with swelling in her right arm as well as both of her legs.  In addition, she has had some pressure in the center of her chest that has been constant since yesterday.  She denies any associated fevers or cough, has not had any pain or redness in her legs.  She states she has been taking her diuretic medication as prescribed, reports recent issues with her electrolytes, especially her calcium.     Physical Exam   Triage Vital Signs: ED Triage Vitals  Encounter Vitals Group     BP 06/01/23 1300 (!) 149/63     Systolic BP Percentile --      Diastolic BP Percentile --      Pulse Rate 06/01/23 1300 74     Resp 06/01/23 1300 (!) 21     Temp 06/01/23 1300 99 F (37.2 C)     Temp Source 06/01/23 1300 Oral     SpO2 06/01/23 1300 98 %     Weight --      Height --      Head Circumference --      Peak Flow --      Pain Score 06/01/23 1257 4     Pain Loc --      Pain Education --      Exclude from Growth Chart --     Most recent vital signs: Vitals:   06/01/23 1519 06/01/23 1548  BP: (!) 154/57 (!) 148/62  Pulse: 68 72  Resp: 20 16  Temp: 98.4 F (36.9 C)   SpO2: 94% 95%    Constitutional: Alert and oriented. Eyes: Conjunctivae are normal. Head: Atraumatic. Nose: No congestion/rhinnorhea. Mouth/Throat: Mucous membranes are moist.  Cardiovascular: Normal rate, regular rhythm. Grossly normal heart sounds.  2+ radial pulses bilaterally. Respiratory: Normal respiratory effort.  No retractions. Lungs with crackles to  bilateral bases. Gastrointestinal: Soft and nontender. No distention. Musculoskeletal: No lower extremity tenderness, 2+ pitting edema to knees bilaterally. Neurologic:  Normal speech and language. No gross focal neurologic deficits are appreciated.    ED Results / Procedures / Treatments   Labs (all labs ordered are listed, but only abnormal results are displayed) Labs Reviewed  BASIC METABOLIC PANEL - Abnormal; Notable for the following components:      Result Value   CO2 21 (*)    BUN 70 (*)    Creatinine, Ser 2.26 (*)    Calcium 7.0 (*)    GFR, Estimated 21 (*)    All other components within normal limits  CBC - Abnormal; Notable for the following components:   RBC 3.38 (*)    Hemoglobin 9.2 (*)    HCT 29.1 (*)    All other components within normal limits  BRAIN NATRIURETIC PEPTIDE - Abnormal; Notable for the following components:   B Natriuretic Peptide 558.8 (*)    All other components within normal limits  MAGNESIUM - Abnormal; Notable for the following components:   Magnesium 0.9 (*)    All other components within normal limits  TROPONIN I (HIGH SENSITIVITY) - Abnormal; Notable for the following components:   Troponin I (High Sensitivity) 35 (*)    All other components within normal limits  TROPONIN I (HIGH SENSITIVITY) - Abnormal; Notable for the following components:   Troponin I (High Sensitivity) 36 (*)    All other components within normal limits  PHOSPHORUS     EKG  ED ECG REPORT I, Chesley Noon, the attending physician, personally viewed and interpreted this ECG.   Date: 06/01/2023  EKG Time: 12:58  Rate: 72  Rhythm: normal sinus rhythm  Axis: Normal  Intervals:none  ST&T Change: None  RADIOLOGY Chest x-ray reviewed and interpreted by me with cardiomegaly and pulmonary edema, no focal infiltrate.  PROCEDURES:  Critical Care performed: Yes, see critical care procedure note(s)  .Critical Care  Performed by: Chesley Noon, MD Authorized  by: Chesley Noon, MD   Critical care provider statement:    Critical care time (minutes):  30   Critical care time was exclusive of:  Separately billable procedures and treating other patients and teaching time   Critical care was time spent personally by me on the following activities:  Development of treatment plan with patient or surrogate, discussions with consultants, evaluation of patient's response to treatment, examination of patient, ordering and review of laboratory studies, ordering and review of radiographic studies, ordering and performing treatments and interventions, pulse oximetry, re-evaluation of patient's condition and review of old charts    MEDICATIONS ORDERED IN ED: Medications  furosemide (LASIX) injection 40 mg (has no administration in time range)  calcium gluconate 1 g/ 50 mL sodium chloride IVPB (has no administration in time range)  magnesium sulfate IVPB 2 g 50 mL (has no administration in time range)     IMPRESSION / MDM / ASSESSMENT AND PLAN / ED COURSE  I reviewed the triage vital signs and the nursing notes.                              82 y.o. female with past medical history of hypertension, hyperlipidemia, diabetes, CKD, CHF, and anemia who presents to the ED with increasing difficulty breathing with arm and leg swelling over the past 2 days.  Patient's presentation is most consistent with acute presentation with potential threat to life or bodily function.  Differential diagnosis includes, but is not limited to, ACS, PE, pneumonia, CHF, COPD, anemia, electrolyte abnormality, AKI.  Patient nontoxic-appearing and in no acute distress, vital signs are remarkable for mild tachypnea but otherwise reassuring.  Patient does clinically appear fluid overloaded and chest x-ray shows cardiomegaly with evidence of pulmonary edema, no focal infiltrate noted.  EKG shows no evidence of arrhythmia or ischemia, troponin mildly elevated but stable on recheck and  suspect this is due to CHF.  Patient does have recent significant issues with hypo and hypercalcemia, currently with hypocalcemia and significant hypomagnesemia.  She will require IV diuresis, however this could exacerbate her electrolyte issues, we will need to replete both calcium and magnesium.  Given significant CHF exacerbation with associated electrolyte abnormality, case discussed with hospitalist for admission.      FINAL CLINICAL IMPRESSION(S) / ED DIAGNOSES   Final diagnoses:  Acute on chronic diastolic congestive heart failure (HCC)  Hypomagnesemia  Hypocalcemia     Rx / DC Orders   ED Discharge Orders     None        Note:  This document was prepared using Dragon voice recognition  software and may include unintentional dictation errors.   Chesley Noon, MD 06/01/23 [id number]

## 2023-06-02 DIAGNOSIS — I5033 Acute on chronic diastolic (congestive) heart failure: Secondary | ICD-10-CM | POA: Diagnosis not present

## 2023-06-02 LAB — RENAL FUNCTION PANEL
Albumin: 3.3 g/dL — ABNORMAL LOW (ref 3.5–5.0)
Anion gap: 14 (ref 5–15)
BUN: 70 mg/dL — ABNORMAL HIGH (ref 8–23)
CO2: 23 mmol/L (ref 22–32)
Calcium: 7.3 mg/dL — ABNORMAL LOW (ref 8.9–10.3)
Chloride: 102 mmol/L (ref 98–111)
Creatinine, Ser: 2.21 mg/dL — ABNORMAL HIGH (ref 0.44–1.00)
GFR, Estimated: 22 mL/min — ABNORMAL LOW (ref >60)
Glucose, Bld: 84 mg/dL (ref 70–99)
Phosphorus: 3.1 mg/dL (ref 2.5–4.6)
Potassium: 3.2 mmol/L — ABNORMAL LOW (ref 3.5–5.1)
Sodium: 139 mmol/L (ref 135–145)

## 2023-06-02 LAB — BASIC METABOLIC PANEL
Anion gap: 13 (ref 5–15)
BUN: 70 mg/dL — ABNORMAL HIGH (ref 8–23)
CO2: 24 mmol/L (ref 22–32)
Calcium: 7.3 mg/dL — ABNORMAL LOW (ref 8.9–10.3)
Chloride: 101 mmol/L (ref 98–111)
Creatinine, Ser: 2.21 mg/dL — ABNORMAL HIGH (ref 0.44–1.00)
GFR, Estimated: 22 mL/min — ABNORMAL LOW (ref >60)
Glucose, Bld: 84 mg/dL (ref 70–99)
Potassium: 3.1 mmol/L — ABNORMAL LOW (ref 3.5–5.1)
Sodium: 138 mmol/L (ref 135–145)

## 2023-06-02 LAB — CBC
HCT: 26.1 % — ABNORMAL LOW (ref 36.0–46.0)
Hemoglobin: 8.7 g/dL — ABNORMAL LOW (ref 12.0–15.0)
MCH: 27.7 pg (ref 26.0–34.0)
MCHC: 33.3 g/dL (ref 30.0–36.0)
MCV: 83.1 fL (ref 80.0–100.0)
Platelets: 161 10*3/uL (ref 150–400)
RBC: 3.14 MIL/uL — ABNORMAL LOW (ref 3.87–5.11)
RDW: 15.4 % (ref 11.5–15.5)
WBC: 7.2 10*3/uL (ref 4.0–10.5)
nRBC: 0 % (ref 0.0–0.2)

## 2023-06-02 LAB — URIC ACID: Uric Acid, Serum: 12 mg/dL — ABNORMAL HIGH (ref 2.5–7.1)

## 2023-06-02 LAB — MAGNESIUM: Magnesium: 1.4 mg/dL — ABNORMAL LOW (ref 1.7–2.4)

## 2023-06-02 MED ORDER — ORAL CARE MOUTH RINSE
15.0000 mL | OROMUCOSAL | Status: DC
  Administered 2023-06-02: 15 mL via OROMUCOSAL

## 2023-06-02 MED ORDER — MAGNESIUM SULFATE 2 GM/50ML IV SOLN: 2.0000 g | Freq: Once | INTRAVENOUS | Status: DC

## 2023-06-02 MED ORDER — MAGNESIUM SULFATE 4 GM/100ML IV SOLN
4.0000 g | Freq: Once | INTRAVENOUS | Status: AC
  Administered 2023-06-02: 4 g via INTRAVENOUS
  Filled 2023-06-02: qty 100

## 2023-06-02 MED ORDER — MAGNESIUM SULFATE 4 GM/100ML IV SOLN
4.0000 g | Freq: Once | INTRAVENOUS | Status: DC
  Filled 2023-06-02: qty 100

## 2023-06-02 MED ORDER — ORAL CARE MOUTH RINSE: 15.0000 mL | OROMUCOSAL | Status: DC | PRN

## 2023-06-02 MED ORDER — CALCIUM CARBONATE 1250 (500 CA) MG PO TABS
1.0000 | ORAL_TABLET | Freq: Two times a day (BID) | ORAL | Status: DC
  Administered 2023-06-03 – 2023-06-04 (×3): 1250 mg via ORAL
  Filled 2023-06-02 (×5): qty 1

## 2023-06-02 MED ORDER — POTASSIUM CHLORIDE CRYS ER 20 MEQ PO TBCR: 20.0000 meq | EXTENDED_RELEASE_TABLET | ORAL | Status: DC

## 2023-06-02 MED ORDER — COLCHICINE 0.6 MG PO TABS
0.6000 mg | ORAL_TABLET | Freq: Every day | ORAL | Status: AC
  Administered 2023-06-02 – 2023-06-04 (×3): 0.6 mg via ORAL
  Filled 2023-06-02 (×3): qty 1

## 2023-06-02 MED ORDER — CALCIUM GLUCONATE-NACL 2-0.675 GM/100ML-% IV SOLN
2.0000 g | Freq: Once | INTRAVENOUS | Status: AC
  Administered 2023-06-02: 2000 mg via INTRAVENOUS
  Filled 2023-06-02: qty 100

## 2023-06-02 MED ORDER — POTASSIUM CHLORIDE CRYS ER 20 MEQ PO TBCR
40.0000 meq | EXTENDED_RELEASE_TABLET | Freq: Once | ORAL | Status: AC
  Administered 2023-06-02: 40 meq via ORAL
  Filled 2023-06-02: qty 2

## 2023-06-02 NOTE — Plan of Care (Signed)

## 2023-06-02 NOTE — Progress Notes (Signed)
[facility]  ROUNDING NOTE   Subjective:   [name] is a 82 year old female who was admitted to [facility] on 06/01/2023 for Hypocalcemia [E83.51] Hypomagnesemia [E83.42] CHF (congestive heart failure) (HCC) [I50.9] Acute on chronic diastolic congestive heart failure (HCC) [I50.33]   She presents to the emergency department at the advice of the [facility] clinic for evaluation of chest pain, dizziness, shortness of breath, and right hand edema.  Patient is known to our practice and is followed outpatient by Dr. Cherylann Ratel.  She states she has taken all medications as prescribed.  No known fever or chills.  Reports shortness of breath on exertion, room air at baseline.  Patient remains on room air.  Trace lower extremity edema.  Right hand edema noted, patient describes a burning sensation.  She is concerned of possible gout.  Patient remains at baseline renal function.  Chest x-ray shows interstitial changes.  We have been consulted to monitor kidney disease with electrolyte disturbances.   Objective:  Vital signs in last 24 hours:  Temp:  [97.8 F (36.6 C)-98.7 F (37.1 C)] 97.8 F (36.6 C) (08/31 1153) Pulse Rate:  [64-73] 73 (08/31 1153) Resp:  [16-22] 18 (08/31 1153) BP: (137-171)/(56-73) 141/56 (08/31 1153) SpO2:  [91 %-97 %] 94 % (08/31 1153) Weight:  [78.3 kg] 78.3 kg (08/31 0500)  Weight change:  Filed Weights   06/01/23 2040 06/02/23 0500  Weight: 78.3 kg 78.3 kg    Intake/Output: I/O last 3 completed shifts: In: 480 [P.O.:480] Out: 500 [Urine:500]   Intake/Output this shift:  No intake/output data recorded.  Physical Exam: General: NAD, sitting at bedside  Head: Normocephalic, atraumatic. Moist oral mucosal membranes  Eyes: Anicteric  Neck: Supple  Lungs:  Clear to auscultation, normal effort, room air  Heart: Regular rate and rhythm  Abdomen:  Soft, nontender, obese  Extremities: Trace lower extremity peripheral edema.  Right hand nonpitting edema   Neurologic: Alert and oriented, moving all four extremities  Skin: No lesions       Basic Metabolic Panel: Recent Labs  Lab 06/01/23 1300 06/01/23 1529 06/02/23 0558  NA 138  --  139  138  K 3.6  --  3.2*  3.1*  CL 102  --  102  101  CO2 21*  --  23  24  GLUCOSE 87  --  84  84  BUN 70*  --  70*  70*  CREATININE 2.26*  --  2.21*  2.21*  CALCIUM 7.0*  --  7.3*  7.3*  MG  --  0.9* 1.4*  PHOS  --  3.3 3.1    Liver Function Tests: Recent Labs  Lab 06/02/23 0558  ALBUMIN 3.3*   No results for input(s): "LIPASE", "AMYLASE" in the last 168 hours. No results for input(s): "AMMONIA" in the last 168 hours.  CBC: Recent Labs  Lab 06/01/23 1300 06/02/23 0558  WBC 9.0 7.2  HGB 9.2* 8.7*  HCT 29.1* 26.1*  MCV 86.1 83.1  PLT 186 161    Cardiac Enzymes: No results for input(s): "CKTOTAL", "CKMB", "CKMBINDEX", "TROPONINI" in the last 168 hours.  BNP: Invalid input(s): "POCBNP"  CBG: No results for input(s): "GLUCAP" in the last 168 hours.  Microbiology: Results for orders placed or performed during the hospital encounter of 04/16/23  SARS Coronavirus 2 by RT PCR (hospital order, performed in [facility] hospital lab) *cepheid single result test* Anterior Nasal Swab     Status: None   Collection Time: 04/16/23  2:10 PM  Specimen: Anterior Nasal Swab  Result Value Ref Range Status   SARS Coronavirus 2 by RT PCR NEGATIVE NEGATIVE Final    Comment: (NOTE) SARS-CoV-2 target nucleic acids are NOT DETECTED.  The SARS-CoV-2 RNA is generally detectable in upper and lower respiratory specimens during the acute phase of infection. The lowest concentration of SARS-CoV-2 viral copies this assay can detect is 250 copies / mL. A negative result does not preclude SARS-CoV-2 infection and should not be used as the sole basis for treatment or other patient management decisions.  A negative result may occur with improper specimen collection / handling, submission of  specimen other than nasopharyngeal swab, presence of viral mutation(s) within the areas targeted by this assay, and inadequate number of viral copies (<250 copies / mL). A negative result must be combined with clinical observations, patient history, and epidemiological information.  Fact Sheet for Patients:   RoadLapTop.co.za  Fact Sheet for Healthcare Providers: http://kim-miller.com/  This test is not yet approved or  cleared by the [place] FDA and has been authorized for detection and/or diagnosis of SARS-CoV-2 by FDA under an Emergency Use Authorization (EUA).  This EUA will remain in effect (meaning this test can be used) for the duration of the COVID-19 declaration under Section 564(b)(1) of the Act, 21 U.S.C. section 360bbb-3(b)(1), unless the authorization is terminated or revoked sooner.  Performed at [facility], [address] [place], [place] [address]   Respiratory (~20 pathogens) panel by PCR     Status: None   Collection Time: 04/18/23  1:27 PM   Specimen: Nasopharyngeal Swab; Respiratory  Result Value Ref Range Status   Adenovirus NOT DETECTED NOT DETECTED Final   Coronavirus 229E NOT DETECTED NOT DETECTED Final    Comment: (NOTE) The Coronavirus on the Respiratory Panel, DOES NOT test for the novel  Coronavirus (2019 nCoV)    Coronavirus HKU1 NOT DETECTED NOT DETECTED Final   Coronavirus NL63 NOT DETECTED NOT DETECTED Final   Coronavirus OC43 NOT DETECTED NOT DETECTED Final   Metapneumovirus NOT DETECTED NOT DETECTED Final   Rhinovirus / Enterovirus NOT DETECTED NOT DETECTED Final   Influenza A NOT DETECTED NOT DETECTED Final   Influenza B NOT DETECTED NOT DETECTED Final   Parainfluenza Virus 1 NOT DETECTED NOT DETECTED Final   Parainfluenza Virus 2 NOT DETECTED NOT DETECTED Final   Parainfluenza Virus 3 NOT DETECTED NOT DETECTED Final   Parainfluenza Virus 4 NOT DETECTED NOT DETECTED Final    Respiratory Syncytial Virus NOT DETECTED NOT DETECTED Final   Bordetella pertussis NOT DETECTED NOT DETECTED Final   Bordetella Parapertussis NOT DETECTED NOT DETECTED Final   Chlamydophila pneumoniae NOT DETECTED NOT DETECTED Final   Mycoplasma pneumoniae NOT DETECTED NOT DETECTED Final    Comment: Performed at [facility], [address] [place], [place] [address]  Urine Culture (for pregnant, neutropenic or urologic patients or patients with an indwelling urinary catheter)     Status: Abnormal   Collection Time: 04/18/23  1:28 PM   Specimen: Urine, Clean Catch  Result Value Ref Range Status   Specimen Description   Final    URINE, CLEAN CATCH Performed at [facility], [address] [place], [place] [address]    Special Requests   Final    NONE Performed at [facility], [address] [place], [place] [address]    Culture MULTIPLE SPECIES PRESENT, SUGGEST RECOLLECTION (A)  Final   Report Status 04/19/2023 FINAL  Final  Expectorated Sputum  Assessment w Gram Stain, Rflx to Resp Cult     Status: None   Collection Time: 04/19/23  2:00 PM   Specimen: Sputum  Result Value Ref Range Status   Specimen Description SPUTUM  Final   Special Requests NONE  Final   Sputum evaluation   Final    THIS SPECIMEN IS ACCEPTABLE FOR SPUTUM CULTURE Performed at [facility], [address] [place], [place] [address]    Report Status 04/19/2023 FINAL  Final  Culture, Respiratory w Gram Stain     Status: None   Collection Time: 04/19/23  2:00 PM   Specimen: SPU  Result Value Ref Range Status   Specimen Description   Final    SPUTUM Performed at [facility], [address], [place], [place] [address]    Special Requests   Final    NONE Reflexed from [id number] Performed at [facility], [address] [place], [place] [address]    Gram Stain   Final    FEW SQUAMOUS EPITHELIAL CELLS PRESENT FEW WBC PRESENT, PREDOMINANTLY  MONONUCLEAR MODERATE GRAM POSITIVE COCCI IN PAIRS AND CHAINS MODERATE GRAM NEGATIVE RODS FEW YEAST    Culture   Final    MODERATE Normal respiratory flora-no Staph aureus or Pseudomonas seen Performed at [facility], [address], [place], [place] [address]    Report Status 04/23/2023 FINAL  Final    Coagulation Studies: No results for input(s): "LABPROT", "INR" in the last 72 hours.  Urinalysis: No results for input(s): "COLORURINE", "LABSPEC", "PHURINE", "GLUCOSEU", "HGBUR", "BILIRUBINUR", "KETONESUR", "PROTEINUR", "UROBILINOGEN", "NITRITE", "LEUKOCYTESUR" in the last 72 hours.  Invalid input(s): "APPERANCEUR"    Imaging: DG Chest 2 View  Result Date: 06/01/2023 CLINICAL DATA:  Chest pain EXAM: CHEST - 2 VIEW COMPARISON:  X-ray 04/16/2023 and older.  CT 04/18/2023 FINDINGS: Enlarged cardiopericardial silhouette. Calcified aorta. Interstitial changes. No pneumothorax or effusion. No consolidation. Osteopenia. Degenerative changes IMPRESSION: Enlarged cardiopericardial silhouette with interstitial changes. Acute process is possible. Recommend follow-up. Electronically Signed   By: Karen Kays M.D.   On: 06/01/2023 13:42     Medications:     calcium carbonate  1 tablet Oral BID WC   colchicine  0.6 mg Oral Daily   enoxaparin (LOVENOX) injection  30 mg Subcutaneous Q24H   furosemide  40 mg Intravenous Q12H   hydrALAZINE  100 mg Oral TID   levothyroxine  150 mcg Oral Daily   metoprolol tartrate  50 mg Oral BID   mometasone-formoterol  2 puff Inhalation BID   mouth rinse  15 mL Mouth Rinse 4 times per day   pantoprazole  40 mg Oral Daily   rosuvastatin  40 mg Oral Daily   sertraline  100 mg Oral Daily   acetaminophen **OR** acetaminophen, albuterol, hydrALAZINE, loperamide, ondansetron **OR** ondansetron (ZOFRAN) IV, mouth rinse  Assessment/ Plan:  [name] is a 82 y.o.  female past medical conditions including diastolic heart failure, hypertension,  hypothyroidism, COPD, and chronic kidney disease stage IV.  Patient has been admitted for Hypocalcemia [E83.51] Hypomagnesemia [E83.42] CHF (congestive heart failure) (HCC) [I50.9] Acute on chronic diastolic congestive heart failure (HCC) [I50.33]   Chronic kidney disease stage IV with baseline creatinine 2.6 and GFR of 18 on 05/07/23.  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 Patient followed by Dr Cherylann Ratel.  Remains at baseline renal function. Will monitor during this admission.    Lab Results  Component Value Date   CREATININE 2.21 (H) 06/02/2023  CREATININE 2.21 (H) 06/02/2023   CREATININE 2.26 (H) 06/01/2023    Intake/Output Summary (Last 24 hours) at 06/02/2023 1545 Last data filed at 06/02/2023 0500 Gross per 24 hour  Intake 480 ml  Output 500 ml  Net -20 ml   2. Acute on chronic diastolic heart failure. BNP > 500. Mild lower extremity edema. Chest xray shows enlarged cardiopericardial silhouette with interstitial changes. IV Furosemide ordered twice a day.   3. Hypomagnesia, magnesium 0.9. IV supplementation ordered by primary team. Corrected to 1.4  4. Hypocalcemia, history. IV calcium gluconate ordered in ED. Oral supplementation ordered by primary team.   5. Edema, right hand. Questionable gout. [facility] team has ordered uric acid, 12.0. Have ordered Colchicine 0.6mg  daily for 3 days.    LOS: 1 Brinsley Wence 8/31/20243:45 PM

## 2023-06-02 NOTE — TOC Initial Note (Addendum)
Transition of Care [facility]) - Initial/Assessment Note    Patient Details  Name: [name] MRN: [id number] Date of Birth: 05-25-1941  Transition [facility]) CM/SW Contact:    Liliana Cline, LCSW Phone Number: 06/02/2023, 11:44 AM  Clinical Narrative:                 Spoke with patient's daughter Aggie Cosier via phone for high readmission risk assessment. Patient is from home with her husband who provides transportation. PCP is Dr. Dareen Piano. [facility]acy [facility]. Patient has DME at home. No SNF history.  Patient has [facility] in the past, Aggie Cosier is not sure if they are still active, but states she would like them to follow patient again at time of DC for RN and PT services. CSW called Elnita Maxwell with [facility] who confirmed they are active for RN, and can add PT services. Asked MD for [facility] orders.   Expected Discharge Plan: Home/Self Care Barriers to Discharge: Continued Medical Work up   Patient Goals and CMS Choice   CMS Medicare.gov Compare Post Acute Care list provided to:: Patient Choice offered to / list presented to : Patient      Expected Discharge Plan and Services       Living arrangements for the past 2 months: Single Family Home                           HH Arranged: RN, PT [facility] Agency: [facility] Date [facility] Agency Contacted: 06/02/23   Representative spoke with at [facility] Agency: Elnita Maxwell  Prior Living Arrangements/Services Living arrangements for the past 2 months: Single Family Home Lives with:: Spouse Patient language and need for interpreter reviewed:: Yes Do you feel safe going back to the place where you live?: Yes      Need for Family Participation in Patient Care: Yes (Comment) Care giver support system in place?: Yes (comment) Current home services: DME Criminal Activity/Legal Involvement Pertinent to Current Situation/Hospitalization: No - Comment as needed  Activities of Daily Living Home Assistive Devices/Equipment:  Blood pressure cuff, CBG Meter ADL Screening (condition at time of admission) Patient's cognitive ability adequate to safely complete daily activities?: Yes Is the patient deaf or have difficulty hearing?: No Does the patient have difficulty seeing, even when wearing glasses/contacts?: Yes Does the patient have difficulty concentrating, remembering, or making decisions?: No Does the patient have difficulty dressing or bathing?: Yes Independently performs ADLs?: Yes (appropriate for developmental age) Does the patient have difficulty walking or climbing stairs?: Yes Weakness of Legs: Right Weakness of Arms/Hands: Left  Permission Sought/Granted Permission sought to share information with : Facility Industrial/product designer granted to share information with : Yes, Verbal Permission Granted              Emotional Assessment       Orientation: : Fluctuating Orientation (Suspected and/or reported Sundowners) Alcohol / Substance Use: Not Applicable Psych Involvement: No (comment)  Admission diagnosis:  Hypocalcemia [E83.51] Hypomagnesemia [E83.42] CHF (congestive heart failure) (HCC) [I50.9] Acute on chronic diastolic congestive heart failure (HCC) [I50.33] Patient Active Problem List   Diagnosis Date Noted   Hypercalcemia 04/16/2023   Hypermagnesemia 04/16/2023   HLD (hyperlipidemia) 04/16/2023   COPD (chronic obstructive pulmonary disease) (HCC) 04/16/2023   Anemia in chronic kidney disease 03/27/2023   Electrolyte abnormality 03/24/2023   CKD (chronic kidney disease) stage 5, 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   Hyperparathyroidism, unspecified (HCC)  08/05/2022   Metabolic acidosis 08/02/2022   Uremia of renal origin 08/02/2022   Uremia 08/02/2022   Acute metabolic encephalopathy 08/02/2022   Acute on chronic diastolic CHF (congestive heart failure) (HCC) 08/02/2022   Chronic bronchitis (HCC) 08/02/2022   Anemia of chronic kidney failure, stage 5 (HCC)  08/02/2022   Dizziness s/p fall 08/02/2022   AKI (acute kidney injury) (HCC) 06/12/2022   Hypomagnesemia 06/12/2022   Hyperphosphatemia 06/12/2022   Pneumonia due to COVID-19 virus 06/10/2022   Hypocalcemia, symptomatic 06/10/2022   Dyslipidemia 06/10/2022   Depression 06/10/2022   Right shoulder pain 06/10/2022   Generalized weakness    Nausea vomiting and diarrhea    Chronic obstructive pulmonary disease, unspecified (HCC) 10/02/2021   Gouty arthropathy, chronic, 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   Hypothyroidism 09/01/2019   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/25/2019   Multinodular goiter 07/31/2019   Class 2 severe obesity with serious comorbidity in adult [facility]) 12/12/2018   Type 2 diabetes mellitus with obesity (HCC) 09/04/2017   Bilateral lower extremity edema 09/04/2017   Gastroesophageal reflux disease without esophagitis 05/16/2017   Vomiting and diarrhea 12/29/2016   Enteritis 12/29/2016   Acute lower UTI 12/29/2016   Incomplete emptying of bladder 11/01/2016   Urge incontinence 07/14/2015   Acute renal failure superimposed on stage 4 chronic kidney disease (HCC) 08/06/2014   Renal mass, right 08/06/2014   Precordial pain 07/24/2014   Dyspnea 07/24/2014   Essential hypertension 07/24/2014   Hyperlipidemia 07/24/2014   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 [place] [address] Phone: [phone] Fax: [phone]     Social Determinants of Health (SDOH) Social History: SDOH Screenings   Food Insecurity: Unknown (06/01/2023)  Housing: Low Risk  (06/01/2023)  Transportation Needs: No Transportation Needs (06/01/2023)  Utilities: Not At Risk (06/01/2023)  Financial Resource Strain: Low Risk  (05/04/2023)    Received from [facility]  Tobacco Use: Low Risk  (06/01/2023)   SDOH Interventions:     Readmission Risk Interventions    04/19/2023    1:10 PM 03/27/2023   12:56 PM  Readmission Risk Prevention Plan  Transportation Screening Complete Complete  Medication Review (RN Care Manager) Complete Complete  PCP or Specialist appointment within 3-5 days of discharge  Complete  HRI or Home Care Consult Complete Complete  SW Recovery Care/Counseling Consult Complete Complete  Palliative Care Screening Not Applicable Not Applicable  Skilled Nursing Facility Not Applicable Not Applicable

## 2023-06-02 NOTE — Progress Notes (Signed)
[facility] Progress Note  Patient: [name]    AYT:[id number]  DOA: 06/01/2023     Date of Service: the patient was seen and examined on 06/02/2023  Chief Complaint  Patient presents with   Chest Pain   Brief hospital course: [name] is a 82 y.o. 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  She has intermittent chest discomfort.  She denies nausea, vomiting, palpitations lightheadedness.  She has no cough fever or chills. ED course and data review: Mild tachypnea to 22 with O2 sat 98% on room air, low-grade temp of 99, BP 149/63.  Labs notable for troponin 36 and BNP 558 Creatinine 2.26 with bicarb 21,  calcium 7 and magnesium 0.9, normal potassium and phosphorus Hemoglobin 9.2 which is her baseline EKG, personally reviewed and interpreted showing NSR at 72 with no acute ST-T wave changes Chest x-ray showing  IMPRESSION: Enlarged cardiopericardial silhouette with interstitial changes. Acute process is possible. Recommend follow-up   Patient treated with IV Lasix given calcium gluconate IVPB and magnesium sulfate IVPB Hospitalist consulted for admission.   Assessment and Plan:  Acute on chronic diastolic CHF (congestive heart failure) (HCC) Patient with dyspnea on exertion and lower extremity edema Elevated BNP over 500 and chest x-ray showed the following "Enlarged cardiopericardial silhouette with interstitial changes. Acute process is possible. Recommend follow-up" IV Lasix Continue metoprolol and hydralazine Daily weights with intake and output monitoring Last echo 04/19/2023 showed EF 60 to 65% Can consider limited echo to evaluate pericardial fluid  but will await treatment response and nephrology consult   Hypomagnesemia Received magnesium sulfate IV piggyback in the ED Pharmacy consult for electrolyte management   Hypocalcemia, symptomatic Received calcium gluconate in the ED Started oral calcium supplement twice daily Pharmacy consult for electrolyte management   Paroxysmal A-fib, converted back to sinus rhythm Keep mag >2 and potassium >4 Continue to monitor on telemetry Cardiology consult, we will call cardiology tomorrow a.m., patient may benefit from cardiac monitor as an outpatient   COPD (chronic obstructive pulmonary disease) (HCC) Not acutely exacerbated Continue home inhalers with DuoNebs as needed   CKD (chronic kidney disease) stage 5, GFR less than 15 ml/min (HCC) Anemia of CKD 5 Secondary hyperparathyroidism Nephrology consulted  OSA on CPAP Continue CPAP nightly   S/P total thyroidectomy Continue levothyroxine 150 mcg daily   Gout, complaining of right hand swelling and pain Colchicine 0.6 mg p.o. daily x 3 doses ordered as per nephro Monitor right hand swelling Follow uric acid level   Body mass index is 31.57 kg/m.  Interventions:  Diet: Heart healthy diet DVT Prophylaxis: Subcutaneous Lovenox   Advance goals of care discussion: Full code  Family Communication: family was not  present at bedside, at the time of interview.  The pt provided permission to discuss medical plan with the family. Opportunity was given to ask question and all questions were answered satisfactorily.   Disposition:  Pt is from Home, admitted with diastolic CHF, paroxysmal A-fib, right wrist gout, hypokalemia, hypomagnesemia, still has electrolyte imbalance, which precludes a safe discharge. Discharge to home, when stable, may need 1-2 more days to stay in the hospital.  It is possible to DC tomorrow a.m. if electrolytes  within normal range and remained stable.  Subjective: No significant events overnight, patient  had palpitations early morning due to A-fib with RVR, currently converted back to normal sinus rhythm.  No palpitations or chest pain at this time.  Lower extremity edema resolved.  Patient is complaining of swelling of her right hand and wrist, possible gout.  No any other complaints.   Physical Exam: General: NAD, lying comfortably Appear in no distress, affect appropriate Eyes: PERRLA ENT: Oral Mucosa Clear, moist  Neck: no JVD,  Cardiovascular: S1 and S2 Present, no Murmur,  Respiratory: good respiratory effort, Bilateral Air entry equal and Decreased, no Crackles, no wheezes Abdomen: Bowel Sound present, Soft and no tenderness,  Skin: no rashes Extremities: Mild pedal edema, no calf tenderness.  Right wrist swelling and tenderness, possible gout Neurologic: without any new focal findings Gait not checked due to patient safety concerns  Vitals:   06/02/23 0000 06/02/23 0500 06/02/23 0817 06/02/23 1153  BP: (!) 168/63  137/73 (!) 141/56  Pulse: 70  64 73  Resp:   18 18  Temp: 98.7 F (37.1 C)  98 F (36.7 C) 97.8 F (36.6 C)  TempSrc: Oral     SpO2: 96%  97% 94%  Weight:  78.3 kg    Height:        Intake/Output Summary (Last 24 hours) at 06/02/2023 1516 Last data filed at 06/02/2023 0500 Gross per 24 hour  Intake 480 ml  Output 500 ml  Net -20 ml   Filed Weights   06/01/23 2040 06/02/23 0500  Weight: 78.3 kg 78.3 kg    Data Reviewed: I have personally reviewed and interpreted daily labs, tele strips, imagings as discussed above. I reviewed all nursing notes, pharmacy notes, vitals, pertinent old records I have discussed plan of care as described above with RN and patient/family.  CBC: Recent Labs  Lab 06/01/23 1300 06/02/23 0558  WBC 9.0 7.2  HGB 9.2* 8.7*  HCT 29.1* 26.1*  MCV 86.1 83.1  PLT 186 161   Basic Metabolic Panel: Recent Labs  Lab 06/01/23 1300 06/01/23 1529 06/02/23 0558  NA 138  --  139  138  K 3.6  --  3.2*  3.1*  CL 102  --  102   101  CO2 21*  --  23  24  GLUCOSE 87  --  84  84  BUN 70*  --  70*  70*  CREATININE 2.26*  --  2.21*  2.21*  CALCIUM 7.0*  --  7.3*  7.3*  MG  --  0.9* 1.4*  PHOS  --  3.3 3.1    Studies: No results found.  Scheduled Meds:  colchicine  0.6 mg Oral Daily   enoxaparin (LOVENOX) injection  30 mg Subcutaneous Q24H   furosemide  40 mg Intravenous Q12H   hydrALAZINE  100 mg Oral TID   levothyroxine  150 mcg Oral Daily   metoprolol tartrate  50 mg Oral BID   mometasone-formoterol  2 puff Inhalation BID   mouth rinse  15 mL Mouth Rinse 4 times per day   pantoprazole  40 mg Oral Daily   rosuvastatin  40 mg Oral Daily   sertraline  100 mg Oral Daily   Continuous Infusions: PRN Meds: acetaminophen **OR** acetaminophen, albuterol, hydrALAZINE, loperamide, ondansetron **OR** ondansetron (ZOFRAN) IV, mouth rinse  Time spent: 35 minutes  Author: Gillis Santa. MD [facility] 06/02/2023 3:16 PM  To reach On-call, see care teams to locate the attending and reach  out to them via [email or web address]. If 7PM-7AM, please contact night-coverage If you still have difficulty reaching the attending provider, please page the [facility] (Director on Call) for [facility] on amion for assistance.

## 2023-06-02 NOTE — Consult Note (Addendum)
PHARMACY CONSULT NOTE - ELECTROLYTES  Pharmacy Consult for Electrolyte Monitoring and Replacement   Recent Labs: Height: 5\' 2"  (157.5 cm) Weight: 78.3 kg (172 lb 9.9 oz) IBW/kg (Calculated) : 50.1 Estimated Creatinine Clearance: 19 mL/min (A) (by C-G formula based on SCr of 2.21 mg/dL (H)). Potassium (mmol/L)  Date Value  06/02/2023 3.2 (L)  06/02/2023 3.1 (L)  06/20/2012 3.2 (L)   Magnesium (mg/dL)  Date Value  16/07/9603 1.4 (L)   Calcium (mg/dL)  Date Value  54/06/8118 7.3 (L)  06/02/2023 7.3 (L)   Calcium, Total (mg/dL)  Date Value  14/78/2956 9.4   Albumin (g/dL)  Date Value  21/30/8657 3.3 (L)  06/20/2012 3.8   Phosphorus (mg/dL)  Date Value  84/69/6295 3.1   Sodium (mmol/L)  Date Value  06/02/2023 139  06/02/2023 138  08/11/2014 141  06/20/2012 142   Corrected Ca: 7.9 mg/dL  Assessment  [name] is a 82 y.o. female presenting with shortness of breath and leg swelling . 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 Pharmacy has been consulted to monitor and replace electrolytes.  Diet: starting 8/30 >  MIVF: N/A Pertinent medications: IV lasix 40 BID 8/30 >>  Goal of Therapy: Electrolytes WNL  Plan:  Will order IV Mag 2 gram x 1 (given renal impairment) Will order IV Calcium gluconate 2 gram IV x 1  Will order PO KCL 40 mEQ replacement  Check BMP, Mg,  with AM labs  Thank you for allowing pharmacy to be a part of this patient's care.  Sharen Hones, PharmD, BCPS Clinical Pharmacist   06/02/2023 7:49 AM

## 2023-06-03 ENCOUNTER — Inpatient Hospital Stay: Payer: Medicare Other

## 2023-06-03 DIAGNOSIS — I10 Essential (primary) hypertension: Secondary | ICD-10-CM | POA: Diagnosis not present

## 2023-06-03 DIAGNOSIS — I480 Paroxysmal atrial fibrillation: Secondary | ICD-10-CM | POA: Diagnosis not present

## 2023-06-03 DIAGNOSIS — N185 Chronic kidney disease, stage 5: Secondary | ICD-10-CM

## 2023-06-03 DIAGNOSIS — I5033 Acute on chronic diastolic (congestive) heart failure: Secondary | ICD-10-CM | POA: Diagnosis not present

## 2023-06-03 LAB — BASIC METABOLIC PANEL
Anion gap: 10 (ref 5–15)
BUN: 75 mg/dL — ABNORMAL HIGH (ref 8–23)
CO2: 24 mmol/L (ref 22–32)
Calcium: 7.9 mg/dL — ABNORMAL LOW (ref 8.9–10.3)
Chloride: 102 mmol/L (ref 98–111)
Creatinine, Ser: 2.33 mg/dL — ABNORMAL HIGH (ref 0.44–1.00)
GFR, Estimated: 20 mL/min — ABNORMAL LOW (ref >60)
Glucose, Bld: 103 mg/dL — ABNORMAL HIGH (ref 70–99)
Potassium: 3.4 mmol/L — ABNORMAL LOW (ref 3.5–5.1)
Sodium: 136 mmol/L (ref 135–145)

## 2023-06-03 LAB — CBC
HCT: 26.7 % — ABNORMAL LOW (ref 36.0–46.0)
Hemoglobin: 8.7 g/dL — ABNORMAL LOW (ref 12.0–15.0)
MCH: 27.3 pg (ref 26.0–34.0)
MCHC: 32.6 g/dL (ref 30.0–36.0)
MCV: 83.7 fL (ref 80.0–100.0)
Platelets: 180 10*3/uL (ref 150–400)
RBC: 3.19 MIL/uL — ABNORMAL LOW (ref 3.87–5.11)
RDW: 15.4 % (ref 11.5–15.5)
WBC: 5.9 10*3/uL (ref 4.0–10.5)
nRBC: 0 % (ref 0.0–0.2)

## 2023-06-03 LAB — PHOSPHORUS: Phosphorus: 3.7 mg/dL (ref 2.5–4.6)

## 2023-06-03 LAB — IRON AND TIBC
Iron: 34 ug/dL (ref 28–170)
Saturation Ratios: 12 % (ref 10.4–31.8)
TIBC: 288 ug/dL (ref 250–450)
UIBC: 254 ug/dL

## 2023-06-03 LAB — FOLATE: Folate: 11.9 ng/mL (ref >5.9)

## 2023-06-03 LAB — MAGNESIUM: Magnesium: 2.3 mg/dL (ref 1.7–2.4)

## 2023-06-03 MED ORDER — VITAMIN D (ERGOCALCIFEROL) 1.25 MG (50000 UNIT) PO CAPS
50000.0000 [IU] | ORAL_CAPSULE | ORAL | Status: DC
  Administered 2023-06-03: 50000 [IU] via ORAL
  Filled 2023-06-03: qty 1

## 2023-06-03 MED ORDER — POTASSIUM CHLORIDE CRYS ER 20 MEQ PO TBCR
40.0000 meq | EXTENDED_RELEASE_TABLET | Freq: Once | ORAL | Status: AC
  Administered 2023-06-03: 40 meq via ORAL
  Filled 2023-06-03: qty 2

## 2023-06-03 MED ORDER — APIXABAN 2.5 MG PO TABS
2.5000 mg | ORAL_TABLET | Freq: Two times a day (BID) | ORAL | Status: DC
  Administered 2023-06-03 – 2023-06-04 (×3): 2.5 mg via ORAL
  Filled 2023-06-03 (×3): qty 1

## 2023-06-03 MED ORDER — FUROSEMIDE 40 MG PO TABS
40.0000 mg | ORAL_TABLET | Freq: Two times a day (BID) | ORAL | Status: DC
  Administered 2023-06-03 – 2023-06-04 (×2): 40 mg via ORAL
  Filled 2023-06-03 (×2): qty 1

## 2023-06-03 MED ORDER — VITAMIN B-12 1000 MCG PO TABS
500.0000 ug | ORAL_TABLET | Freq: Every day | ORAL | Status: DC
  Administered 2023-06-03 – 2023-06-04 (×2): 500 ug via ORAL
  Filled 2023-06-03 (×2): qty 1

## 2023-06-03 MED ORDER — AMLODIPINE BESYLATE 5 MG PO TABS
5.0000 mg | ORAL_TABLET | Freq: Every day | ORAL | Status: DC
  Administered 2023-06-03 – 2023-06-04 (×2): 5 mg via ORAL
  Filled 2023-06-03 (×2): qty 1

## 2023-06-03 NOTE — Plan of Care (Signed)

## 2023-06-03 NOTE — Consult Note (Signed)
PHARMACY CONSULT NOTE - ELECTROLYTES  Pharmacy Consult for Electrolyte Monitoring and Replacement   Recent Labs: Height: 5\' 2"  (157.5 cm) Weight: 78.5 kg (173 lb) IBW/kg (Calculated) : 50.1 Estimated Creatinine Clearance: 18.1 mL/min (A) (by C-G formula based on SCr of 2.33 mg/dL (H)). Potassium (mmol/L)  Date Value  06/03/2023 3.4 (L)  06/20/2012 3.2 (L)   Magnesium (mg/dL)  Date Value  16/07/9603 2.3   Calcium (mg/dL)  Date Value  54/06/8118 7.9 (L)   Calcium, Total (mg/dL)  Date Value  14/78/2956 9.4   Albumin (g/dL)  Date Value  21/30/8657 3.3 (L)  06/20/2012 3.8   Phosphorus (mg/dL)  Date Value  84/69/6295 3.7   Sodium (mmol/L)  Date Value  06/03/2023 136  08/11/2014 141  06/20/2012 142   Corrected Ca: 9.96 mg/dL  Assessment  [name] is a 82 y.o. female presenting with shortness of breath and leg swelling . 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 Pharmacy has been consulted to monitor and replace electrolytes.  Diet: starting 8/30 >  MIVF: N/A Pertinent medications: IV lasix 40 BID 8/30 >>  Goal of Therapy: Electrolytes WNL  Plan:  Will order PO KCL 40 mEQ replacement  Check BMP, Mg,  with AM labs  Thank you for allowing pharmacy to be a part of this patient's care.  Bettey Costa, PharmD Clinical Pharmacist   06/03/2023 7:27 AM

## 2023-06-03 NOTE — Consult Note (Signed)
Cardiology Consultation   Patient ID: NASIAH PAWLAK MRN: [id number]; DOB: May 25, 1941  Admit date: 06/01/2023 Date of Consult: 06/03/2023  PCP:  Lauro Regulus, MD    HeartCare Providers Cardiologist:  None        Patient Profile:   [name] is a 82 y.o. 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 Lucianne Muss.  History of Present Illness:   [name] has previously undergone nuclear stress testing which shows moderate fixed inferior lateral defect with moderate reversibility normal ejection fraction.  This was suggestive of a prior infarct with moderate peri-infarct ischemia.  Left heart catheterization (2015) showed minor irregularities with no obstructive disease and normal LVEDP.  She was recently evaluated in the hospital from 04/16/2023 - 04/22/2023 after presenting with generalized weakness and shortness of breath.  This was after a hospitalization from 6/21 - 6/25 due to hypocalcemia and hypomagnesia.  During her most recent hospitalization she continued to have electrolyte abnormalities which was likely given to overcorrection of recent electrolyte abnormalities at her previous hospital admission.  She has chronic debility and PT advised home health that was planned upon her discharge.  She also had acute on chronic kidney failure stage IV nephrology continue to follow while she was inpatient.  Atrial fibrillation was noted on telemetry on 7/15 with max ventricular rate of 120 bpm.  It resolved spontaneously without intervention after correction of electrolytes.  Echocardiogram showed mild pulmonary hypertension and severe left atrial dilatation.  She was monitored on  telemetry throughout the remainder of her hospitalization without recurrence noted.  Anticoagulation was deferred at that point outpatient cardiology was follow-up was advised.  He is considered stable and discharged subsequently on 04/22/2023.  She presented back to [facility] emergency department on 06/01/2023 with complaints of shortness of breath.  Patient stated she had 2 days of increasing difficulty breathing and swelling in her right arm as well as both of her lower extremities.  She stated in addition to the shortness of breath that she had some chest tightness in the center of her chest.  She stated it was constant with no alleviating or aggravating factors. She had denied any associated fevers or cough and states that she had been taking all of his medication as prescribed.  Initial vital signs: Blood pressure 149/63, pulse was 74, respirations of 21, temperature of 99  Pertinent labs: CO2 21, BUN 70, serum creatinine 2.26, calcium of 7, estimated GFR of 21, hemoglobin 9.2, BNP 558.8, magnesium 0.9, high-sensitivity troponin 35 and 36  Imaging: Chest x-ray revealed enlarged cardiopericardial silhouette with interstitial changes, acute process is possible, recommend follow-up  Medications administered in the emergency department: Furosemide 40 mg IVP, calcium gluconate 1 g IVP, magnesium sulfate 2 g IVP  Cardiology was consulted for transient atrial fibrillation as well as HFpEF  Past Medical History:  Diagnosis Date   Actinic keratosis    Albuminuria    Anemia    Arthritis    Asthma    Basal cell carcinoma 04/12/2017   Above right lateral brow. Nodulocystic type. EDC   Cancer (HCC)    skin   Cataract cortical, senile    CHF (congestive heart failure) (HCC)    Diabetes mellitus without complication (  HCC)    GERD (gastroesophageal reflux disease)    Hemorrhoids    History of kidney stones    Hyperlipidemia    Hypertension    Hypothyroidism    Lyme disease    No kidney function     OSA (obstructive sleep apnea)    Osteoporosis    Osteoporosis    Reflux esophagitis    Steatohepatitis    Steatohepatitis     Past Surgical History:  Procedure Laterality Date   ABDOMINAL HYSTERECTOMY     APPENDECTOMY     AV FISTULA PLACEMENT Left 05/19/2022   Procedure: ARTERIOVENOUS (AV) FISTULA CREATION ( BRACHIAL CEPHALIC );  Surgeon: Renford Dills, MD;  Location: [facility] ORS;  Service: Vascular;  Laterality: Left;   CARDIAC CATHETERIZATION  1980   [facility]   CARDIAC CATHETERIZATION  08/13/2014   [facility]. no significant CAD, normal LVEDP.    CATARACT EXTRACTION     CHOLECYSTECTOMY     COLONOSCOPY     COLONOSCOPY WITH PROPOFOL N/A 12/07/2016   Procedure: COLONOSCOPY WITH PROPOFOL;  Surgeon: Christena Deem, MD;  Location: [facility] ENDOSCOPY;  Service: Endoscopy;  Laterality: N/A;   ESOPHAGOGASTRODUODENOSCOPY     ESOPHAGOGASTRODUODENOSCOPY (EGD) WITH PROPOFOL N/A 12/07/2016   Procedure: ESOPHAGOGASTRODUODENOSCOPY (EGD) WITH PROPOFOL;  Surgeon: Christena Deem, MD;  Location: [facility] ENDOSCOPY;  Service: Endoscopy;  Laterality: N/A;   ESOPHAGOGASTRODUODENOSCOPY (EGD) WITH PROPOFOL N/A 01/07/2018   Procedure: ESOPHAGOGASTRODUODENOSCOPY (EGD) WITH PROPOFOL;  Surgeon: Christena Deem, MD;  Location: [facility] ENDOSCOPY;  Service: Endoscopy;  Laterality: N/A;   EYE SURGERY     HEMORRHOID SURGERY     PARTIAL HYSTERECTOMY     THYROIDECTOMY N/A 08/25/2019   Procedure: THYROIDECTOMY EXTRACTION OF SUBTOTAL COMPONENT; PARATHYROID AUTOTRANSPLANT X1;  Surgeon: Duanne Guess, MD;  Location: [facility] ORS;  Service: General;  Laterality: N/A;  With Nerve Monitoring(RLN)   TONSILLECTOMY       Home Medications:  Prior to Admission medications   Medication Sig Start Date End Date Taking? Authorizing Provider  acetaminophen (TYLENOL) 500 MG tablet Take 1,000 mg by mouth every 6 (six) hours as needed for mild pain or moderate pain.   Yes [provider]  albuterol (PROVENTIL HFA;VENTOLIN HFA) 108 (90  BASE) MCG/ACT inhaler Inhale 2 puffs into the lungs every 6 (six) hours as needed for wheezing or shortness of breath.   Yes [provider]  amLODipine (NORVASC) 10 MG tablet Take 0.5 tablets (5 mg total) by mouth daily. 04/22/23  Yes Wouk, Wilfred Curtis, MD  azelastine (ASTELIN) 0.1 % nasal spray Place 1 spray into both nostrils 2 (two) times daily.   Yes [provider]  esomeprazole (NEXIUM) 40 MG capsule Take 40 mg by mouth daily before breakfast.   Yes [provider]  fluticasone (FLONASE) 50 MCG/ACT nasal spray Place 2 sprays into both nostrils daily.   Yes [provider]  fluticasone-salmeterol (ADVAIR) 500-50 MCG/ACT AEPB Inhale 1 puff into the lungs in the morning and at bedtime.   Yes [provider]  hydrALAZINE (APRESOLINE) 100 MG tablet Take 1 tablet (100 mg total) by mouth 3 (three) times daily. 04/22/23  Yes Wouk, Wilfred Curtis, MD  ipratropium-albuterol (DUONEB) 0.5-2.5 (3) MG/3ML SOLN Take 3 mLs by nebulization every 6 (six) hours as needed (SOB). 03/27/23  Yes Arnetha Courser, MD  levothyroxine (SYNTHROID) 150 MCG tablet Take 150 mcg by mouth daily.   Yes [provider]  lidocaine (LIDODERM) 5 % Place 1 patch onto the skin every 12 (twelve) hours. Remove &  Discard patch within 12 hours or as directed by MD 06/08/22  Yes Sharman Cheek, MD  loperamide (IMODIUM) 2 MG capsule Take 1 capsule (2 mg total) by mouth as needed for diarrhea or loose stools. 08/05/22  Yes Arnetha Courser, MD  metoprolol tartrate (LOPRESSOR) 50 MG tablet Take 50 mg by mouth 2 (two) times daily.    Yes [provider]  montelukast (SINGULAIR) 10 MG tablet Take 10 mg by mouth at bedtime.   Yes [provider]  Multiple Minerals-Vitamins (CALCIUM & VIT D3 BONE HEALTH PO) Take 1 tablet by mouth daily. 600 mg/ 25 mg   Yes [provider]  potassium chloride (KLOR-CON) 10 MEQ tablet Take 10 mEq by mouth daily. 05/30/23 05/29/24 Yes [provider]  rosuvastatin (CRESTOR) 40 MG tablet Take 40 mg by mouth daily.   Yes [provider]  sertraline (ZOLOFT) 100 MG tablet Take 100 mg by mouth daily.   Yes [provider]  torsemide (DEMADEX) 20 MG tablet Take 20 mg by mouth once. 05/09/23 05/08/24 Yes [provider]    Inpatient Medications: Scheduled Meds:  calcium carbonate  1 tablet Oral BID WC   colchicine  0.6 mg Oral Daily   vitamin B-12  500 mcg Oral Daily   enoxaparin (LOVENOX) injection  30 mg Subcutaneous Q24H   furosemide  40 mg Intravenous Q12H   hydrALAZINE  100 mg Oral TID   levothyroxine  150 mcg Oral Daily   metoprolol tartrate  50 mg Oral BID   mometasone-formoterol  2 puff Inhalation BID   mouth rinse  15 mL Mouth Rinse 4 times per day   pantoprazole  40 mg Oral Daily   rosuvastatin  40 mg Oral Daily   sertraline  100 mg Oral Daily   Vitamin D (Ergocalciferol)  50,000 Units Oral Q7 days   Continuous Infusions:  PRN Meds: acetaminophen **OR** acetaminophen, albuterol, hydrALAZINE, loperamide, ondansetron **OR** ondansetron (ZOFRAN) IV, mouth rinse  Allergies:    Allergies  Allergen Reactions   Ace Inhibitors     Other reaction(s): Unknown   Egg-Derived Products Diarrhea   Other     Other reaction(s): Other (See Comments) Eggs   Prednisone     Other reaction(s): Other (See Comments) joint pain   Risedronate     Other reaction(s): Other (See Comments)   Sulfa Antibiotics Itching and Swelling    Other reaction(s): Other (See Comments)   Sulfasalazine Other (See Comments)    Social History:   Social History   Socioeconomic History   Marital status: Married    Spouse name: Not on file   Number of children: Not on file   Years of education: Not on file   Highest education level: Not on file  Occupational History   Not on file  Tobacco Use   Smoking status: Never   Smokeless tobacco: Never  Vaping Use   Vaping status: Never Used  Substance and Sexual  Activity   Alcohol use: No   Drug use: No   Sexual activity: Not on file  Other Topics Concern   Not on file  Social History Narrative   Lives at home with husband .   Social Determinants of Health   Financial Resource Strain: Low Risk  (05/04/2023)   Received from [facility]   Overall Financial Resource Strain (CARDIA)    Difficulty of Paying Living Expenses: Not hard at all  Food Insecurity: Unknown (06/01/2023)   Hunger Vital Sign  Worried About Programme researcher, broadcasting/film/video in the Last Year: Never true    [employer] of Food in the Last Year: Patient declined  Transportation Needs: No Transportation Needs (06/01/2023)   PRAPARE - Administrator, Civil Service (Medical): No    Lack of Transportation (Non-Medical): No  Physical Activity: Not on file  Stress: Not on file  Social Connections: Not on file  Intimate Partner Violence: Not At Risk (06/01/2023)   Humiliation, Afraid, Rape, and Kick questionnaire    Fear of Current or Ex-Partner: No    Emotionally Abused: No    Physically Abused: No    Sexually Abused: No    Family History:    Family History  Problem Relation Age of Onset   Breast cancer Mother    Heart attack Father      ROS:  Please see the history of present illness.  Review of Systems  Constitutional:  Positive for malaise/fatigue.  Respiratory:  Positive for shortness of breath.   Cardiovascular:  Positive for palpitations and leg swelling.  Neurological:  Positive for weakness.    All other ROS reviewed and negative.     Physical Exam/Data:   Vitals:   06/02/23 2317 06/03/23 0346 06/03/23 0500 06/03/23 0756  BP: (!) 145/55 (!) 158/55  (!) 157/55  Pulse: 69 70  66  Resp: 20 20  16   Temp:  98 F (36.7 C)  (!) 97.1 F (36.2 C)  TempSrc:  Oral    SpO2: 95% 93%  97%  Weight:   78.5 kg   Height:        Intake/Output Summary (Last 24 hours) at 06/03/2023 1121 Last data filed at 06/03/2023 0600 Gross per 24 hour  Intake --   Output 1000 ml  Net -1000 ml      06/03/2023    5:00 AM 06/02/2023    5:00 AM 06/01/2023    8:40 PM  Last 3 Weights  Weight (lbs) 173 lb 172 lb 9.9 oz 172 lb 9.9 oz  Weight (kg) 78.472 kg 78.3 kg 78.3 kg     Body mass index is 31.64 kg/m.  General:  Well nourished, well developed, in no acute distress HEENT: normal Neck: unable to assess JVD due to body habitus Vascular: No carotid bruits; Distal pulses 2+ bilaterally Cardiac:  normal S1, S2; RRR; II/VI systolic murmur without rubs or gallops Lungs:  clear to auscultation bilaterally, no wheezing, rhonchi or rales, respirations are unlabored at rest on room air Abd: soft, nontender, obese, no hepatomegaly  Ext: Trace pretibial edema Musculoskeletal:  No deformities, BUE and BLE strength normal and equal Skin: warm and dry  Neuro:  CNs 2-12 intact, no focal abnormalities noted Psych:  Normal affect   EKG:  The EKG was personally reviewed and demonstrates: On admission she was sinus rhythm with a rate of 72, yesterday she was noted to be in atrial fibrillation with a rate of 106 with a left axis deviation, spontaneously converted Telemetry:  Telemetry was personally reviewed and demonstrates: Sinus rhythm rate in the 60s with 1 7 beat run of nonsustained V. tach and unifocal PVCs noted overnight  Relevant CV Studies: TTE 04/19/2023 1. Left ventricular ejection fraction, by estimation, is 60 to 65%. The  left ventricle has normal function. The left ventricle has no regional  wall motion abnormalities. There is mild left ventricular hypertrophy.  Left ventricular diastolic parameters  are indeterminate.   2. Right ventricular systolic function is normal. The right ventricular  size is mildly enlarged. There is mildly elevated pulmonary artery  systolic pressure.   3. Left atrial size was severely dilated.   4. Right atrial size was mildly dilated.   5. The mitral valve is normal in structure. Mild mitral valve  regurgitation.    6. The aortic valve is tricuspid. Aortic valve regurgitation is not  visualized.   7. The inferior vena cava is normal in size with greater than 50%  respiratory variability, suggesting right atrial pressure of 3 mmHg.   Laboratory Data:  High Sensitivity Troponin:   Recent Labs  Lab 06/01/23 1300 06/01/23 1529  TROPONINIHS 35* 36*     Chemistry Recent Labs  Lab 06/01/23 1300 06/01/23 1529 06/02/23 0558 06/03/23 0531  NA 138  --  139  138 136  K 3.6  --  3.2*  3.1* 3.4*  CL 102  --  102  101 102  CO2 21*  --  23  24 24   GLUCOSE 87  --  84  84 103*  BUN 70*  --  70*  70* 75*  CREATININE 2.26*  --  2.21*  2.21* 2.33*  CALCIUM 7.0*  --  7.3*  7.3* 7.9*  MG  --  0.9* 1.4* 2.3  GFRNONAA 21*  --  22*  22* 20*  ANIONGAP 15  --  14  13 10     Recent Labs  Lab 06/02/23 0558  ALBUMIN 3.3*   Lipids No results for input(s): "CHOL", "TRIG", "HDL", "LABVLDL", "LDLCALC", "CHOLHDL" in the last 168 hours.  Hematology Recent Labs  Lab 06/01/23 1300 06/02/23 0558 06/03/23 0531  WBC 9.0 7.2 5.9  RBC 3.38* 3.14* 3.19*  HGB 9.2* 8.7* 8.7*  HCT 29.1* 26.1* 26.7*  MCV 86.1 83.1 83.7  MCH 27.2 27.7 27.3  MCHC 31.6 33.3 32.6  RDW 15.5 15.4 15.4  PLT 186 161 180   Thyroid No results for input(s): "TSH", "FREET4" in the last 168 hours.  BNP Recent Labs  Lab 06/01/23 1300  BNP 558.8*    DDimer No results for input(s): "DDIMER" in the last 168 hours.   Radiology/Studies:  DG Chest 2 View  Result Date: 06/01/2023 CLINICAL DATA:  Chest pain EXAM: CHEST - 2 VIEW COMPARISON:  X-ray 04/16/2023 and older.  CT 04/18/2023 FINDINGS: Enlarged cardiopericardial silhouette. Calcified aorta. Interstitial changes. No pneumothorax or effusion. No consolidation. Osteopenia. Degenerative changes IMPRESSION: Enlarged cardiopericardial silhouette with interstitial changes. Acute process is possible. Recommend follow-up. Electronically Signed   By: Karen Kays M.D.   On: 06/01/2023 13:42      Assessment and Plan:   Paroxysmal atrial fibrillation -Patient noted to be in atrial fibrillation: Twelve-lead EKG yesterday -Per chart review she was also in atrial fibrillation on 04/16/2023 -On review of telemetry she has maintained sinus but did have 1 run of NSVT on telemetry overnight -CHA2DS2-VASc score of at least 6 -Patient will require oral anticoagulation for stroke prophylaxis -Previously OAC was deferred due to electrolyte imbalances with correction PAF spontaneously resolved but with kidney function she is at a high risk for repeat electrolyte abnormalities prompting PAF -Has been started on apixaban 2.5 mg twice daily with previous Lovenox discontinued, she meets reduced dosing criteria for age and kidney function -Recommend ZIO monitor on discharge to determine burden of atrial fibrillation -Continue with telemetry monitoring  Acute on chronic HFpEF -Presented with shortness of breath -LVEF exceeded 65% --1 L output in the last 24 hours -Continued on furosemide 40 mg IV twice daily  -BNP 558.8 -  Not a candidate for SGLT2 inhibitor or MRA due to kidney function -Continue with heart failure education -Daily weights, I's and O's, low-sodium diet  Electrolyte derangement (hypomagnesium, hypocalcium, hypokalemia) -2 g of magnesium IVP given corrected to 1.4 -1 g of calcium gluconate given in the emergency department -Serum potassium 3.4 with supplement given -Monitor/trend/replete electrolytes as needed -Recommend keeping potassium closer to 4 less than 5 magnesium 2 -Daily BMP  COPD -Not currently in exacerbation -Continue on PTA bronchodilators  CKD stage V/anemia of chronic disease/secondary hyperparathyroidism -Serum creatinine 2.33 -Baseline creatinine around 2.6 -Monitor urine output -Monitor/trend/replete electrolytes as needed -Avoid nephrotoxic agents were able -Nephrology continues to follow -Daily BMP -Hemoglobin 8.7 -Daily  CBC  Hypertension -Blood pressure 157/55 -Continued on furosemide, hydralazine, metoprolol -Vital signs per unit protocol  Mixed hyperlipidemia -Continued on rosuvastatin 40 mg daily  OSA -Continue with nightly CPAP  Hypothyroidism -Continue on levothyroxine   Risk Assessment/Risk Scores:        New York Heart Association (NYHA) Functional Class NYHA Class II  CHA2DS2-VASc Score = 6   This indicates a 9.7% annual risk of stroke. The patient's score is based upon: CHF History: 1 HTN History: 1 Diabetes History: 1 Stroke History: 0 Vascular Disease History: 0 Age Score: 2 Gender Score: 1         For questions or updates, please contact [facility] Please consult [email or web address] for contact info under    Signed, Tuana Hoheisel, NP  06/03/2023 11:21 AM

## 2023-06-03 NOTE — Progress Notes (Signed)
[facility]  ROUNDING NOTE   Subjective:   [name] is a 82 year old female who was admitted to [facility] on 06/01/2023 for Hypocalcemia [E83.51] Hypomagnesemia [E83.42] CHF (congestive heart failure) (HCC) [I50.9] Acute on chronic diastolic congestive heart failure (HCC) [I50.33]   She presents to the emergency department at the advice of the [facility] clinic for evaluation of chest pain, dizziness, shortness of breath, and right hand edema.  Patient is known to our practice and is followed outpatient by Dr. Cherylann Ratel.  She states she has taken all medications as prescribed.  No known fever or chills.  Reports shortness of breath on exertion, room air at baseline.  Patient remains on room air.  Trace lower extremity edema.  Right hand edema noted, patient describes a burning sensation.  She is concerned of possible gout.  Patient remains close to baseline renal function.  Chest x-ray shows interstitial changes.  Volume status is acceptable. Right hand swelling and pain is much improved but she still feels her right wrist is hot and has some pain in it.   Objective:  Vital signs in last 24 hours:  Temp:  [97.1 F (36.2 C)-98.8 F (37.1 C)] 97.1 F (36.2 C) (09/01 0756) Pulse Rate:  [66-73] 66 (09/01 0756) Resp:  [16-20] 16 (09/01 0756) BP: (128-158)/(47-57) 157/55 (09/01 0756) SpO2:  [93 %-97 %] 97 % (09/01 0756) Weight:  [78.5 kg] 78.5 kg (09/01 0500)  Weight change: 0.172 kg Filed Weights   06/01/23 2040 06/02/23 0500 06/03/23 0500  Weight: 78.3 kg 78.3 kg 78.5 kg    Intake/Output: I/O last 3 completed shifts: In: 480 [P.O.:480] Out: 1500 [Urine:1500]   Intake/Output this shift:  No intake/output data recorded.  Physical Exam: General: NAD, sitting at bedside  Head: Normocephalic, atraumatic. Moist oral mucosal membranes  Eyes: Anicteric  Neck: Supple  Lungs:  Clear to auscultation, normal effort, room air  Heart: Regular rate and rhythm  Abdomen:  Soft,  nontender, obese  Extremities: Trace lower extremity peripheral edema.  Right hand nonpitting edema  Neurologic: Alert and oriented, moving all four extremities  Skin: No lesions       Basic Metabolic Panel: Recent Labs  Lab 06/01/23 1300 06/01/23 1529 06/02/23 0558 06/03/23 0531  NA 138  --  139  138 136  K 3.6  --  3.2*  3.1* 3.4*  CL 102  --  102  101 102  CO2 21*  --  23  24 24   GLUCOSE 87  --  84  84 103*  BUN 70*  --  70*  70* 75*  CREATININE 2.26*  --  2.21*  2.21* 2.33*  CALCIUM 7.0*  --  7.3*  7.3* 7.9*  MG  --  0.9* 1.4* 2.3  PHOS  --  3.3 3.1 3.7    Liver Function Tests: Recent Labs  Lab 06/02/23 0558  ALBUMIN 3.3*   No results for input(s): "LIPASE", "AMYLASE" in the last 168 hours. No results for input(s): "AMMONIA" in the last 168 hours.  CBC: Recent Labs  Lab 06/01/23 1300 06/02/23 0558 06/03/23 0531  WBC 9.0 7.2 5.9  HGB 9.2* 8.7* 8.7*  HCT 29.1* 26.1* 26.7*  MCV 86.1 83.1 83.7  PLT 186 161 180    Cardiac Enzymes: No results for input(s): "CKTOTAL", "CKMB", "CKMBINDEX", "TROPONINI" in the last 168 hours.  BNP: Invalid input(s): "POCBNP"  CBG: No results for input(s): "GLUCAP" in the last 168 hours.  Microbiology: Results for orders placed or performed during the  hospital encounter of 04/16/23  SARS Coronavirus 2 by RT PCR (hospital order, performed in [facility] hospital lab) *cepheid single result test* Anterior Nasal Swab     Status: None   Collection Time: 04/16/23  2:10 PM   Specimen: Anterior Nasal Swab  Result Value Ref Range Status   SARS Coronavirus 2 by RT PCR NEGATIVE NEGATIVE Final    Comment: (NOTE) SARS-CoV-2 target nucleic acids are NOT DETECTED.  The SARS-CoV-2 RNA is generally detectable in upper and lower respiratory specimens during the acute phase of infection. The lowest concentration of SARS-CoV-2 viral copies this assay can detect is 250 copies / mL. A negative result does not preclude SARS-CoV-2  infection and should not be used as the sole basis for treatment or other patient management decisions.  A negative result may occur with improper specimen collection / handling, submission of specimen other than nasopharyngeal swab, presence of viral mutation(s) within the areas targeted by this assay, and inadequate number of viral copies (<250 copies / mL). A negative result must be combined with clinical observations, patient history, and epidemiological information.  Fact Sheet for Patients:   RoadLapTop.co.za  Fact Sheet for Healthcare Providers: http://kim-miller.com/  This test is not yet approved or  cleared by the [place] FDA and has been authorized for detection and/or diagnosis of SARS-CoV-2 by FDA under an Emergency Use Authorization (EUA).  This EUA will remain in effect (meaning this test can be used) for the duration of the COVID-19 declaration under Section 564(b)(1) of the Act, 21 U.S.C. section 360bbb-3(b)(1), unless the authorization is terminated or revoked sooner.  Performed at [facility], [address] [place], [place] [address]   Respiratory (~20 pathogens) panel by PCR     Status: None   Collection Time: 04/18/23  1:27 PM   Specimen: Nasopharyngeal Swab; Respiratory  Result Value Ref Range Status   Adenovirus NOT DETECTED NOT DETECTED Final   Coronavirus 229E NOT DETECTED NOT DETECTED Final    Comment: (NOTE) The Coronavirus on the Respiratory Panel, DOES NOT test for the novel  Coronavirus (2019 nCoV)    Coronavirus HKU1 NOT DETECTED NOT DETECTED Final   Coronavirus NL63 NOT DETECTED NOT DETECTED Final   Coronavirus OC43 NOT DETECTED NOT DETECTED Final   Metapneumovirus NOT DETECTED NOT DETECTED Final   Rhinovirus / Enterovirus NOT DETECTED NOT DETECTED Final   Influenza A NOT DETECTED NOT DETECTED Final   Influenza B NOT DETECTED NOT DETECTED Final   Parainfluenza Virus 1 NOT DETECTED  NOT DETECTED Final   Parainfluenza Virus 2 NOT DETECTED NOT DETECTED Final   Parainfluenza Virus 3 NOT DETECTED NOT DETECTED Final   Parainfluenza Virus 4 NOT DETECTED NOT DETECTED Final   Respiratory Syncytial Virus NOT DETECTED NOT DETECTED Final   Bordetella pertussis NOT DETECTED NOT DETECTED Final   Bordetella Parapertussis NOT DETECTED NOT DETECTED Final   Chlamydophila pneumoniae NOT DETECTED NOT DETECTED Final   Mycoplasma pneumoniae NOT DETECTED NOT DETECTED Final    Comment: Performed at [facility], [address] [place], [place] [address]  Urine Culture (for pregnant, neutropenic or urologic patients or patients with an indwelling urinary catheter)     Status: Abnormal   Collection Time: 04/18/23  1:28 PM   Specimen: Urine, Clean Catch  Result Value Ref Range Status   Specimen Description   Final    URINE, CLEAN CATCH Performed at [facility], [address] [place], [place] [address]    Special Requests  Final    NONE Performed at [facility], [address] [place], [place] [address]    Culture MULTIPLE SPECIES PRESENT, SUGGEST RECOLLECTION (A)  Final   Report Status 04/19/2023 FINAL  Final  Expectorated Sputum Assessment w Gram Stain, Rflx to Resp Cult     Status: None   Collection Time: 04/19/23  2:00 PM   Specimen: Sputum  Result Value Ref Range Status   Specimen Description SPUTUM  Final   Special Requests NONE  Final   Sputum evaluation   Final    THIS SPECIMEN IS ACCEPTABLE FOR SPUTUM CULTURE Performed at [facility], [address], [place], [place] [address]    Report Status 04/19/2023 FINAL  Final  Culture, Respiratory w Gram Stain     Status: None   Collection Time: 04/19/23  2:00 PM   Specimen: SPU  Result Value Ref Range Status   Specimen Description   Final    SPUTUM Performed at [facility], [address], [place], [place] [address]    Special Requests   Final    NONE Reflexed from  [id number] Performed at [facility], [address] [place], [place] [address]    Gram Stain   Final    FEW SQUAMOUS EPITHELIAL CELLS PRESENT FEW WBC PRESENT, PREDOMINANTLY MONONUCLEAR MODERATE GRAM POSITIVE COCCI IN PAIRS AND CHAINS MODERATE GRAM NEGATIVE RODS FEW YEAST    Culture   Final    MODERATE Normal respiratory flora-no Staph aureus or Pseudomonas seen Performed at [facility], [address], [place], [place] [address]    Report Status 04/23/2023 FINAL  Final    Coagulation Studies: No results for input(s): "LABPROT", "INR" in the last 72 hours.  Urinalysis: No results for input(s): "COLORURINE", "LABSPEC", "PHURINE", "GLUCOSEU", "HGBUR", "BILIRUBINUR", "KETONESUR", "PROTEINUR", "UROBILINOGEN", "NITRITE", "LEUKOCYTESUR" in the last 72 hours.  Invalid input(s): "APPERANCEUR"    Imaging: DG Chest 2 View  Result Date: 06/01/2023 CLINICAL DATA:  Chest pain EXAM: CHEST - 2 VIEW COMPARISON:  X-ray 04/16/2023 and older.  CT 04/18/2023 FINDINGS: Enlarged cardiopericardial silhouette. Calcified aorta. Interstitial changes. No pneumothorax or effusion. No consolidation. Osteopenia. Degenerative changes IMPRESSION: Enlarged cardiopericardial silhouette with interstitial changes. Acute process is possible. Recommend follow-up. Electronically Signed   By: Karen Kays M.D.   On: 06/01/2023 13:42     Medications:     calcium carbonate  1 tablet Oral BID WC   colchicine  0.6 mg Oral Daily   vitamin B-12  500 mcg Oral Daily   enoxaparin (LOVENOX) injection  30 mg Subcutaneous Q24H   furosemide  40 mg Intravenous Q12H   hydrALAZINE  100 mg Oral TID   levothyroxine  150 mcg Oral Daily   metoprolol tartrate  50 mg Oral BID   mometasone-formoterol  2 puff Inhalation BID   mouth rinse  15 mL Mouth Rinse 4 times per day   pantoprazole  40 mg Oral Daily   rosuvastatin  40 mg Oral Daily   sertraline  100 mg Oral Daily   Vitamin D (Ergocalciferol)  50,000 Units Oral Q7  days   acetaminophen **OR** acetaminophen, albuterol, hydrALAZINE, loperamide, ondansetron **OR** ondansetron (ZOFRAN) IV, mouth rinse  Assessment/ Plan:  [name] is a 82 y.o.  female past medical conditions including diastolic heart failure, hypertension, hypothyroidism, COPD, and chronic kidney disease stage IV.  Patient has been admitted for Hypocalcemia [E83.51] Hypomagnesemia [E83.42] CHF (congestive heart failure) (HCC) [I50.9] Acute on chronic diastolic congestive heart failure (  HCC) [I50.33]   Chronic kidney disease stage IV with baseline creatinine 2.6 and GFR of 18 on 05/07/23.  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 Patient followed by Dr Cherylann Ratel.  Remains at baseline renal function. Will monitor during this admission.    Lab Results  Component Value Date   CREATININE 2.33 (H) 06/03/2023   CREATININE 2.21 (H) 06/02/2023   CREATININE 2.21 (H) 06/02/2023    Intake/Output Summary (Last 24 hours) at 06/03/2023 1139 Last data filed at 06/03/2023 0600 Gross per 24 hour  Intake --  Output 1000 ml  Net -1000 ml   2. Acute on chronic diastolic heart failure. BNP > 500. Mild lower extremity edema. Chest xray shows enlarged cardiopericardial silhouette with interstitial changes.  Currently receiving IV furosemide twice a day.  Still has trace to 1+ edema.  3. Hypomagnesia, corrected to 2.3 today.  4. Hypocalcemia, history. IV calcium gluconate ordered in ED. Oral supplementation ordered by primary team.   5. Edema, pain right hand. Questionable gout. [facility] team has ordered uric acid, 12.0. Have ordered Colchicine 0.6mg  daily for 3 days.  Symptomatically improved   LOS: 2 Neeti Knudtson 9/1/202411:39 AM

## 2023-06-03 NOTE — Progress Notes (Signed)
[facility] Progress Note  Patient: [name]    ZOX:[id number]  DOA: 06/01/2023     Date of Service: the patient was seen and examined on 06/03/2023  Chief Complaint  Patient presents with   Chest Pain   Brief hospital course: [name] is a 82 y.o. 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  She has intermittent chest discomfort.  She denies nausea, vomiting, palpitations lightheadedness.  She has no cough fever or chills. ED course and data review: Mild tachypnea to 22 with O2 sat 98% on room air, low-grade temp of 99, BP 149/63.  Labs notable for troponin 36 and BNP 558 Creatinine 2.26 with bicarb 21,  calcium 7 and magnesium 0.9, normal potassium and phosphorus Hemoglobin 9.2 which is her baseline EKG, personally reviewed and interpreted showing NSR at 72 with no acute ST-T wave changes Chest x-ray showing  IMPRESSION: Enlarged cardiopericardial silhouette with interstitial changes. Acute process is possible. Recommend follow-up   Patient treated with IV Lasix given calcium gluconate IVPB and magnesium sulfate IVPB Hospitalist consulted for admission.   Assessment and Plan:  Acute on chronic diastolic CHF (congestive heart failure) (HCC) Patient with dyspnea on exertion and lower extremity edema Elevated BNP over 500 and chest x-ray showed the following "Enlarged cardiopericardial silhouette with interstitial changes. Acute process is possible. Recommend follow-up" S/p IV Lasix 40 BID, on 9/1 transition to Lasix 40 mg p.o. twice daily Continue metoprolol and hydralazine Daily weights with intake and output monitoring Last echo 04/19/2023 showed EF 60 to  65% Can consider limited echo to evaluate pericardial fluid but will await treatment response and nephrology consult   Hypomagnesemia Received magnesium sulfate IV piggyback in the ED Pharmacy consult for electrolyte management   Hypocalcemia, symptomatic Received calcium gluconate in the ED Started oral calcium supplement twice daily Pharmacy consult for electrolyte management   Paroxysmal A-fib, converted back to sinus rhythm CHA2DS2-VASc score of at least 6, patient was started on Eliquis 2.5 mg p.o. twice daily as per cardiology.  Continue metoprolol 50 twice daily Keep mag >2 and potassium >4 Continue to monitor on telemetry Cardiology recommended Zio patch as an outpatient, possible antiarrhythmic therapy.  Started DOAC.   COPD (chronic obstructive pulmonary disease) (HCC) Not acutely exacerbated Continue home inhalers with DuoNebs as needed   CKD (chronic kidney disease) stage 5, GFR less than 15 ml/min (HCC) Anemia of CKD 5 Secondary hyperparathyroidism Nephrology consulted  OSA on CPAP Continue CPAP nightly   S/P total thyroidectomy Continue levothyroxine 150 mcg daily   Acute gout flare, complaining of right hand swelling and pain Colchicine 0.6 mg p.o. daily x 3 doses ordered as per nephro right hand swelling improved, uric acid level 12.0, elevated. start allopurinol after resolution of acute gout flare    Body mass index is 31.64 kg/m.  Interventions:  Diet: Heart healthy diet DVT Prophylaxis: Subcutaneous Lovenox   Advance goals of care discussion: Full code  Family Communication: family was not  present at bedside, at the time of interview.  The pt provided permission to discuss medical plan with the family. Opportunity was given to ask question and all questions were answered satisfactorily.   Disposition:  Pt is from  Home, admitted with diastolic CHF, paroxysmal A-fib, right wrist gout, hypokalemia, hypomagnesemia, still has electrolyte  imbalance, which precludes a safe discharge. Discharge to home, most likely tomorrow a.m.  Subjective: No significant events overnight, right hand swelling and tenderness improved, patient is able to make a fist, still has mild tenderness.  No shortness of breath, denied any lower extremity edema.  No any other complaints.   Physical Exam: General: NAD, lying comfortably Appear in no distress, affect appropriate Eyes: PERRLA ENT: Oral Mucosa Clear, moist  Neck: no JVD,  Cardiovascular: S1 and S2 Present, no Murmur,  Respiratory: good respiratory effort, Bilateral Air entry equal and Decreased, no Crackles, no wheezes Abdomen: Bowel Sound present, Soft and no tenderness,  Skin: no rashes Extremities: No pedal edema, no calf tenderness.  Right wrist swelling and tenderness is improving Neurologic: without any new focal findings Gait not checked due to patient safety concerns  Vitals:   06/03/23 0346 06/03/23 0500 06/03/23 0756 06/03/23 1211  BP: (!) 158/55  (!) 157/55 (!) 113/47  Pulse: 70  66 64  Resp: 20  16 16   Temp: 98 F (36.7 C)  (!) 97.1 F (36.2 C) 97.9 F (36.6 C)  TempSrc: Oral     SpO2: 93%  97% 95%  Weight:  78.5 kg    Height:        Intake/Output Summary (Last 24 hours) at 06/03/2023 1533 Last data filed at 06/03/2023 0600 Gross per 24 hour  Intake --  Output 1000 ml  Net -1000 ml   Filed Weights   06/01/23 2040 06/02/23 0500 06/03/23 0500  Weight: 78.3 kg 78.3 kg 78.5 kg    Data Reviewed: I have personally reviewed and interpreted daily labs, tele strips, imagings as discussed above. I reviewed all nursing notes, pharmacy notes, vitals, pertinent old records I have discussed plan of care as described above with RN and patient/family.  CBC: Recent Labs  Lab 06/01/23 1300 06/02/23 0558 06/03/23 0531  WBC 9.0 7.2 5.9  HGB 9.2* 8.7* 8.7*  HCT 29.1* 26.1* 26.7*  MCV 86.1 83.1 83.7  PLT 186 161 180   Basic Metabolic Panel: Recent Labs  Lab  06/01/23 1300 06/01/23 1529 06/02/23 0558 06/03/23 0531  NA 138  --  139  138 136  K 3.6  --  3.2*  3.1* 3.4*  CL 102  --  102  101 102  CO2 21*  --  23  24 24   GLUCOSE 87  --  84  84 103*  BUN 70*  --  70*  70* 75*  CREATININE 2.26*  --  2.21*  2.21* 2.33*  CALCIUM 7.0*  --  7.3*  7.3* 7.9*  MG  --  0.9* 1.4* 2.3  PHOS  --  3.3 3.1 3.7    Studies: DG Hand Complete Right  Result Date: 06/03/2023 CLINICAL DATA:  Right hand pain and swelling for a couple of days. EXAM: RIGHT HAND - COMPLETE 3+ VIEW COMPARISON:  None Available. FINDINGS: Mildly decreased bone mineralization. Mild thumb interphalangeal joint space narrowing and dorsal lateral spurring. Mild-to-moderate second through fifth DIP and mild second through fifth PIP joint space narrowing and peripheral osteophytosis. Mild thumb carpometacarpal joint space narrowing and peripheral osteophytosis. Mild distal radioulnar joint space narrowing and peripheral spurring. No acute fracture or dislocation. 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 Electronically Signed   By: Neita Garnet M.D.   On: 06/03/2023 12:46    Scheduled Meds:  amLODipine  5 mg Oral Daily  apixaban  2.5 mg Oral BID   calcium carbonate  1 tablet Oral BID WC   colchicine  0.6 mg Oral Daily   vitamin B-12  500 mcg Oral Daily   furosemide  40 mg Oral BID   hydrALAZINE  100 mg Oral TID   levothyroxine  150 mcg Oral Daily   metoprolol tartrate  50 mg Oral BID   mometasone-formoterol  2 puff Inhalation BID   pantoprazole  40 mg Oral Daily   rosuvastatin  40 mg Oral Daily   sertraline  100 mg Oral Daily   Vitamin D (Ergocalciferol)  50,000 Units Oral Q7 days   Continuous Infusions: PRN Meds: acetaminophen **OR** acetaminophen, albuterol, hydrALAZINE, loperamide, ondansetron **OR** ondansetron (ZOFRAN) IV  Time spent: 35 minutes  Author: Gillis Santa. MD [facility] 06/03/2023 3:33 PM  To reach On-call, see care teams to  locate the attending and reach out to them via [email or web address]. If 7PM-7AM, please contact night-coverage If you still have difficulty reaching the attending provider, please page the [facility] (Director on Call) for [facility] on amion for assistance.

## 2023-06-04 DIAGNOSIS — I480 Paroxysmal atrial fibrillation: Secondary | ICD-10-CM | POA: Diagnosis not present

## 2023-06-04 DIAGNOSIS — J449 Chronic obstructive pulmonary disease, unspecified: Secondary | ICD-10-CM

## 2023-06-04 DIAGNOSIS — D631 Anemia in chronic kidney disease: Secondary | ICD-10-CM

## 2023-06-04 DIAGNOSIS — I5033 Acute on chronic diastolic (congestive) heart failure: Secondary | ICD-10-CM | POA: Diagnosis not present

## 2023-06-04 DIAGNOSIS — G4733 Obstructive sleep apnea (adult) (pediatric): Secondary | ICD-10-CM

## 2023-06-04 LAB — CBC
HCT: 28 % — ABNORMAL LOW (ref 36.0–46.0)
Hemoglobin: 8.9 g/dL — ABNORMAL LOW (ref 12.0–15.0)
MCH: 27.3 pg (ref 26.0–34.0)
MCHC: 31.8 g/dL (ref 30.0–36.0)
MCV: 85.9 fL (ref 80.0–100.0)
Platelets: 208 10*3/uL (ref 150–400)
RBC: 3.26 MIL/uL — ABNORMAL LOW (ref 3.87–5.11)
RDW: 15.1 % (ref 11.5–15.5)
WBC: 6 10*3/uL (ref 4.0–10.5)
nRBC: 0 % (ref 0.0–0.2)

## 2023-06-04 LAB — BASIC METABOLIC PANEL
Anion gap: 11 (ref 5–15)
BUN: 72 mg/dL — ABNORMAL HIGH (ref 8–23)
CO2: 26 mmol/L (ref 22–32)
Calcium: 8.8 mg/dL — ABNORMAL LOW (ref 8.9–10.3)
Chloride: 107 mmol/L (ref 98–111)
Creatinine, Ser: 2.33 mg/dL — ABNORMAL HIGH (ref 0.44–1.00)
GFR, Estimated: 20 mL/min — ABNORMAL LOW (ref >60)
Glucose, Bld: 113 mg/dL — ABNORMAL HIGH (ref 70–99)
Potassium: 4 mmol/L (ref 3.5–5.1)
Sodium: 139 mmol/L (ref 135–145)

## 2023-06-04 LAB — PHOSPHORUS: Phosphorus: 3.6 mg/dL (ref 2.5–4.6)

## 2023-06-04 LAB — MAGNESIUM: Magnesium: 2.2 mg/dL (ref 1.7–2.4)

## 2023-06-04 MED ORDER — AMLODIPINE BESYLATE 5 MG PO TABS: 5.0000 mg | ORAL_TABLET | Freq: Every day | ORAL | 11 refills | Status: DC

## 2023-06-04 MED ORDER — COLCHICINE 0.3 MG HALF TABLET: 0.3000 mg | ORAL_TABLET | Freq: Every day | ORAL | Status: DC

## 2023-06-04 MED ORDER — VITAMIN D (ERGOCALCIFEROL) 1.25 MG (50000 UNIT) PO CAPS: 50000.0000 [IU] | ORAL_CAPSULE | ORAL | 0 refills | Status: AC

## 2023-06-04 MED ORDER — ALLOPURINOL 100 MG PO TABS: 100.0000 mg | ORAL_TABLET | Freq: Every day | ORAL | 11 refills | Status: DC

## 2023-06-04 MED ORDER — APIXABAN 2.5 MG PO TABS: 2.5000 mg | ORAL_TABLET | Freq: Two times a day (BID) | ORAL | 11 refills | Status: DC

## 2023-06-04 MED ORDER — CYANOCOBALAMIN 500 MCG PO TABS: 500.0000 ug | ORAL_TABLET | Freq: Every day | ORAL | 0 refills | Status: AC

## 2023-06-04 MED ORDER — AMLODIPINE BESYLATE 5 MG PO TABS: 5.0000 mg | ORAL_TABLET | Freq: Two times a day (BID) | ORAL | Status: DC

## 2023-06-04 MED ORDER — ALLOPURINOL 100 MG PO TABS: 100.0000 mg | ORAL_TABLET | Freq: Every day | ORAL | Status: DC

## 2023-06-04 MED ORDER — FUROSEMIDE 40 MG PO TABS: 40.0000 mg | ORAL_TABLET | Freq: Two times a day (BID) | ORAL | 2 refills | Status: DC

## 2023-06-04 MED ORDER — COLCHICINE 0.6 MG PO TABS: 0.3000 mg | ORAL_TABLET | Freq: Every day | ORAL | 0 refills | Status: DC

## 2023-06-04 NOTE — Care Management Important Message (Signed)
Important Message  Patient Details  Name: [name] MRN: [id number] Date of Birth: [birth year]   Medicare Important Message Given:  Yes     Johnell Comings 06/04/2023, 12:44 PM

## 2023-06-04 NOTE — Progress Notes (Signed)
Rounding Note    Patient Name: [name] Date of Encounter: 06/04/2023  [facility] Cardiologist: Humberto Seals  Subjective   Reports feeling much improved today Denies chest pain or shortness of breath Right hand swelling much improved," gout has resolved" Blood pressure mildly elevated Telemetry reviewed, maintaining normal sinus rhythm  Inpatient Medications    Scheduled Meds:  [START ON 06/05/2023] allopurinol  100 mg Oral Daily   amLODipine  5 mg Oral BID   apixaban  2.5 mg Oral BID   calcium carbonate  1 tablet Oral BID WC   [START ON 06/05/2023] colchicine  0.3 mg Oral Daily   vitamin B-12  500 mcg Oral Daily   furosemide  40 mg Oral BID   hydrALAZINE  100 mg Oral TID   levothyroxine  150 mcg Oral Daily   metoprolol tartrate  50 mg Oral BID   mometasone-formoterol  2 puff Inhalation BID   pantoprazole  40 mg Oral Daily   rosuvastatin  40 mg Oral Daily   sertraline  100 mg Oral Daily   Vitamin D (Ergocalciferol)  50,000 Units Oral Q7 days   Continuous Infusions:  PRN Meds: acetaminophen **OR** acetaminophen, albuterol, hydrALAZINE, loperamide, ondansetron **OR** ondansetron (ZOFRAN) IV   Vital Signs    Vitals:   06/03/23 2127 06/04/23 0511 06/04/23 0547 06/04/23 0844  BP: (!) 152/51 (!) 174/59 (!) 166/57 (!) 160/55  Pulse: 68 64 (!) 59 63  Resp: 20 20 20 16   Temp: 97.9 F (36.6 C) (!) 97.5 F (36.4 C)  (!) 97.5 F (36.4 C)  TempSrc:  Oral  Oral  SpO2: 99% 95% 95% 97%  Weight:      Height:       No intake or output data in the 24 hours ending 06/04/23 1143    06/03/2023    5:00 AM 06/02/2023    5:00 AM 06/01/2023    8:40 PM  Last 3 Weights  Weight (lbs) 173 lb 172 lb 9.9 oz 172 lb 9.9 oz  Weight (kg) 78.472 kg 78.3 kg 78.3 kg      Telemetry    Normal sinus rhythm- Personally Reviewed  ECG     - Personally Reviewed  Physical Exam   GEN: No acute distress.   Neck: No JVD Cardiac: RRR, no murmurs, rubs, or gallops.   Respiratory: Clear to auscultation bilaterally. GI: Soft, nontender, non-distended  MS: No edema; No deformity. Neuro:  Nonfocal  Psych: Normal affect   Labs    High Sensitivity Troponin:   Recent Labs  Lab 06/01/23 1300 06/01/23 1529  TROPONINIHS 35* 36*     Chemistry Recent Labs  Lab 06/02/23 0558 06/03/23 0531 06/04/23 0505  NA 139  138 136 139  K 3.2*  3.1* 3.4* 4.0  CL 102  101 102 107  CO2 23  24 24 26   GLUCOSE 84  84 103* 113*  BUN 70*  70* 75* 72*  CREATININE 2.21*  2.21* 2.33* 2.33*  CALCIUM 7.3*  7.3* 7.9* 8.8*  MG 1.4* 2.3 2.2  ALBUMIN 3.3*  --   --   GFRNONAA 22*  22* 20* 20*  ANIONGAP 14  13 10 11     Lipids No results for input(s): "CHOL", "TRIG", "HDL", "LABVLDL", "LDLCALC", "CHOLHDL" in the last 168 hours.  Hematology Recent Labs  Lab 06/02/23 0558 06/03/23 0531 06/04/23 0505  WBC 7.2 5.9 6.0  RBC 3.14* 3.19* 3.26*  HGB 8.7* 8.7* 8.9*  HCT 26.1* 26.7* 28.0*  MCV  83.1 83.7 85.9  MCH 27.7 27.3 27.3  MCHC 33.3 32.6 31.8  RDW 15.4 15.4 15.1  PLT 161 180 208   Thyroid No results for input(s): "TSH", "FREET4" in the last 168 hours.  BNP Recent Labs  Lab 06/01/23 1300  BNP 558.8*    DDimer No results for input(s): "DDIMER" in the last 168 hours.   Radiology    DG Hand Complete Right  Result Date: 06/03/2023 CLINICAL DATA:  Right hand pain and swelling for a couple of days. EXAM: RIGHT HAND - COMPLETE 3+ VIEW COMPARISON:  None Available. FINDINGS: Mildly decreased bone mineralization. Mild thumb interphalangeal joint space narrowing and dorsal lateral spurring. Mild-to-moderate second through fifth DIP and mild second through fifth PIP joint space narrowing and peripheral osteophytosis. Mild thumb carpometacarpal joint space narrowing and peripheral osteophytosis. Mild distal radioulnar joint space narrowing and peripheral spurring. No acute fracture or dislocation. 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  Electronically Signed   By: Neita Garnet M.D.   On: 06/03/2023 12:46    Cardiac Studies   Echo 1. Left ventricular ejection fraction, by estimation, is 60 to 65%. The  left ventricle has normal function. The left ventricle has no regional  wall motion abnormalities. There is mild left ventricular hypertrophy.  Left ventricular diastolic parameters  are indeterminate.   2. Right ventricular systolic function is normal. The right ventricular  size is mildly enlarged. There is mildly elevated pulmonary artery  systolic pressure.   3. Left atrial size was severely dilated.   4. Right atrial size was mildly dilated.   5. The mitral valve is normal in structure. Mild mitral valve  regurgitation.   6. The aortic valve is tricuspid. Aortic valve regurgitation is not  visualized.   7. The inferior vena cava is normal in size with greater than 50%  respiratory variability, suggesting right atrial pressure of 3 mmHg.    Patient Profile     [name] is a 82 y.o. 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, hyperparathyroidism, anemia of chronic disease, HFpEF, hypothyroidism, who is being seen 06/03/2023 for the evaluation of a brief episode of atrial fibrillation and spontaneously converted   Assessment & Plan    Paroxysmal atrial fibrillation Presenting in normal sinus rhythm August 30 -Patient noted to be in atrial fibrillation: June 02, 2023 also in atrial fibrillation on 04/16/2023 -CHA2DS2-VASc score of at least 6 -Continue Eliquis 2.5 twice daily -Continue metoprolol 50 twice daily -Aggressive blood pressure control -Zio monitor will be sent to her house for further monitoring, determine A-fib burden -May need antiarrhythmic therapy for high A-fib burden   Acute on chronic HFpEF Normal ejection fraction -Initially treated with furosemide 40 mg IV twice daily  -BNP 558.8 -Not  a candidate for SGLT2 inhibitor or MRA due to kidney function Was transitioned yesterday to oral Lasix 40 twice daily   Electrolyte derangement (hypomagnesium, hypocalcium, hypokalemia) -2 g of magnesium IVP given corrected to 1.4 -1 g of calcium gluconate given in the emergency department -Serum potassium supplemented   COPD -Not currently in exacerbation -Continue bronchodilators   CKD stage V/anemia of chronic disease/secondary hyperparathyroidism -Serum creatinine 2.33, appears to be slowly improving from last month -Hemoglobin 8.7 Followed by nephrology   Hypertension -Blood pressure 157/55 -Continued on furosemide, hydralazine, metoprolol -Amlodipine up to 5 twice daily   Mixed hyperlipidemia -Continued on rosuvastatin 40 mg daily  OSA -Continue with nightly CPAP   Hypothyroidism -Continue on levothyroxine   Long discussion concerning management of her blood pressure and atrial fibrillation  Total encounter time more than 50 minutes  Greater than 50% was spent in counseling and coordination of care with the patient     For questions or updates, please contact [facility] Please consult [email or web address] for contact info under        Signed, Julien Nordmann, MD  06/04/2023, 11:43 AM

## 2023-06-04 NOTE — Consult Note (Signed)
PHARMACY CONSULT NOTE - ELECTROLYTES  Pharmacy Consult for Electrolyte Monitoring and Replacement   Recent Labs: Height: 5\' 2"  (157.5 cm) Weight: 78.5 kg (173 lb) IBW/kg (Calculated) : 50.1 Estimated Creatinine Clearance: 18.1 mL/min (A) (by C-G formula based on SCr of 2.33 mg/dL (H)). Potassium (mmol/L)  Date Value  06/04/2023 4.0  06/20/2012 3.2 (L)   Magnesium (mg/dL)  Date Value  16/07/9603 2.2   Calcium (mg/dL)  Date Value  54/06/8118 8.8 (L)   Calcium, Total (mg/dL)  Date Value  14/78/2956 9.4   Albumin (g/dL)  Date Value  21/30/8657 3.3 (L)  06/20/2012 3.8   Phosphorus (mg/dL)  Date Value  84/69/6295 3.6   Sodium (mmol/L)  Date Value  06/04/2023 139  08/11/2014 141  06/20/2012 142   Corrected Ca: 9.96 mg/dL  Assessment  [name] is a 82 y.o. female presenting with shortness of breath and leg swelling . 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 Pharmacy has been consulted to monitor and replace electrolytes.  Pertinent medications: furosemide 40mg  po BID   Goal of Therapy: Electrolytes WNL  Plan:  No electrolyte replacement warranted for today Check BMP, Mg,  with AM labs  Thank you for allowing pharmacy to be a part of this patient's care.  Lowella Bandy, PharmD Clinical Pharmacist   06/04/2023 9:32 AM

## 2023-06-04 NOTE — Discharge Summary (Signed)
[facility] Discharge Summary   Patient: [name] ZOX:[id number]  PCP: Lauro Regulus, MD  Date of admission: 06/01/2023   Date of discharge:  06/04/2023     Discharge Diagnoses:  Principal Problem:   Acute on chronic diastolic congestive heart failure (HCC) Active Problems:   Hypocalcemia, symptomatic   Hypomagnesemia   CKD (chronic kidney disease) stage 5, GFR less than 15 ml/min (HCC)   COPD (chronic obstructive pulmonary disease) (HCC)   Anemia of chronic kidney failure, stage 5 (HCC)   OSA on CPAP   S/P total thyroidectomy   Depression   PAF (paroxysmal atrial fibrillation) (HCC)   Admitted From: Home Disposition:  Home   Recommendations for Outpatient Follow-up:  Follow-up with PCP in 1 week Follow-up with nephrologist in 1 week, repeat BMP after 1 week to check renal functions and electrolytes. Follow with cardiologist in 1 to 2 weeks for Zio patch cardiac monitor. Follow up LABS/TEST:  as above   Follow-up Information     Lauro Regulus, MD Follow up in 1 week(s).   Specialty: Internal Medicine Contact information: [address] [place] [address] [phone]         Mosetta Pigeon, MD Follow up in 1 week(s).   Specialty: Nephrology Contact information: [address] [place] [address] [phone]         Antonieta Iba, MD Follow up in 1 week(s).   Specialty: Cardiology Contact information: [address] [place] [address] [phone]                Diet recommendation: Cardiac diet  Activity: The patient is advised to gradually reintroduce usual activities, as tolerated  Discharge Condition: stable  Code Status: Full code   History of present illness: As per the H and P dictated on admission Hospital Course:  [name] is a 82 y.o. female with medical history significant for CKD 5 not on HD with secondary hyperparathyroidism  last seen by nephrology  01/2023, anemia of CKD, diastolic CHF, HTN, hypothyroidism, COPD, hospitalized in June and then in July 2024 with electrolyte abnormalities related to calcium and magnesium who now presents to the ED with shortness of breath and leg swelling.  She has intermittent chest discomfort.  She denies nausea, vomiting, palpitations lightheadedness.  She has no cough fever or chills. ED course and data review: Mild tachypnea to 22 with O2 sat 98% on room air, low-grade temp of 99, BP 149/63.  Labs notable for troponin 36 and BNP 558 Creatinine 2.26 with bicarb 21,  calcium 7 and magnesium 0.9, normal potassium and phosphorus Hemoglobin 9.2 which is her baseline EKG, personally reviewed and interpreted showing NSR at 72 with no acute ST-T wave changes Chest x-ray showing  IMPRESSION: Enlarged cardiopericardial silhouette with interstitial changes. Acute process is possible. Recommend follow-up   Patient treated with IV Lasix given calcium gluconate IVPB and magnesium sulfate IVPB Hospitalist consulted for admission.    Assessment and Plan:   # Acute on chronic diastolic CHF (congestive heart failure) Patient with dyspnea on exertion and lower extremity edema Elevated BNP over 500 and chest x-ray: Enlarged cardiopericardial silhouette with interstitial changes. Acute process is possible. Recommend follow-up. S/p IV Lasix 40 BID, on 9/1 transition to Lasix 40 mg p.o. twice daily. Continue metoprolol and hydralazine. Last echo 04/19/2023 showed EF 60 to 65%.  Nephro was consulted for dialysis, recommended to continue Lasix 40 mg p.o. twice daily, discontinue home torsemide  on discharge.  Continued potassium supplement.  Patient was advised to follow with PCP and nephrology in 1 week and repeat BMP after 1 week. # Paroxysmal A-fib, converted back to sinus rhythm CHA2DS2-VASc score of at least 6, patient was started on Eliquis 2.5 mg p.o. twice daily as per cardiology.  Continue metoprolol 50 twice daily. Keep  mag >2 and potassium >4, s/p Tele monitor, no more events. Cardiology recommended Zio patch as an outpatient, possible antiarrhythmic therapy.  Started DOAC Eliquis 2.5 mg p.o. twice daily.  Due to hypertension increased amlodipine 5 mg p.o. twice daily.  Patient was advised to monitor BP at home and follow with PCP and cardiology for further management. # Hypomagnesemia: Mag repleted and resolved. # Hypocalcemia, symptomatic.Received calcium gluconate in the ED. S/p oral calcium supplement twice daily. Resumed home oral calcium tablets.  Repeat BMP after 1 week and follow with PCP for further management. # COPD (chronic obstructive pulmonary disease): Remained stable, no exacerbation noticed.  Resumed home inhalers. 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   Renal functions remained stable.  Nephrology was consulted.  Recommended to follow-up as an outpatient.   # OSA on CPAP: Continue CPAP nightly # S/P total thyroidectomy: Continue levothyroxine 150 mcg daily # Acute gout flare, complaining of right hand swelling and pain. S/p Colchicine 0.6 mg p.o. daily x 3 doses given as per nephro. right hand swelling improved, uric acid level 12.0, elevated.  Started allopurinol 100 mg p.o. daily and colchicine 0.3 mg p.o. daily for 1 week.   Body mass index is 31.64 kg/m.  Nutrition Interventions:  Patient was ambulatory without any assistance. On the day of the discharge the patient's vitals were stable, and no other acute medical condition were reported by patient. the patient was felt safe to be discharge at Home .  Consultants: Nephrologist and cardiologist Procedures: None  Discharge Exam: General: Appear in no distress, no Rash; Oral Mucosa Clear, moist. Cardiovascular: S1 and S2 Present, no Murmur, Respiratory: normal respiratory effort, Bilateral Air entry present and no Crackles, no wheezes Abdomen: Bowel Sound present, Soft and no tenderness,  no hernia Extremities: no Pedal edema, no calf tenderness Neurology: alert and oriented to time, place, and person affect appropriate.  Filed Weights   06/01/23 2040 06/02/23 0500 06/03/23 0500  Weight: 78.3 kg 78.3 kg 78.5 kg   Vitals:   06/04/23 0844 06/04/23 1144  BP: (!) 160/55 (!) 145/46  Pulse: 63 61  Resp: 16 16  Temp: (!) 97.5 F (36.4 C) 97.8 F (36.6 C)  SpO2: 97% 95%    DISCHARGE MEDICATION: Allergies as of 06/04/2023       Reactions   Ace Inhibitors    Other reaction(s): Unknown   Egg-derived Products Diarrhea   Other    Other reaction(s): Other (See Comments) Eggs   Prednisone    Other reaction(s): Other (See Comments) joint pain   Risedronate    Other reaction(s): Other (See Comments)   Sulfa Antibiotics Itching, Swelling   Other reaction(s): Other (See Comments)   Sulfasalazine Other (See Comments)        Medication List     STOP taking these medications    torsemide 20 MG tablet Commonly known as: DEMADEX       TAKE these medications    acetaminophen 500 MG tablet Commonly known as: TYLENOL Take 1,000 mg by mouth every 6 (six) hours as needed for mild pain or moderate pain.   albuterol 108 (  90 Base) MCG/ACT inhaler Commonly known as: VENTOLIN HFA Inhale 2 puffs into the lungs every 6 (six) hours as needed for wheezing or shortness of breath.   allopurinol 100 MG tablet Commonly known as: ZYLOPRIM Take 1 tablet (100 mg total) by mouth daily. Start taking on: June 05, 2023   amLODipine 5 MG tablet Commonly known as: NORVASC Take 1 tablet (5 mg total) by mouth daily. What changed: medication strength   apixaban 2.5 MG Tabs tablet Commonly known as: ELIQUIS Take 1 tablet (2.5 mg total) by mouth 2 (two) times daily.   azelastine 0.1 % nasal spray Commonly known as: ASTELIN Place 1 spray into both nostrils 2 (two) times daily.   CALCIUM & VIT D3 BONE HEALTH PO Take 1 tablet by mouth daily. 600 mg/ 25 mg   colchicine 0.6 MG  tablet Take 0.5 tablets (0.3 mg total) by mouth daily for 8 days. Start taking on: June 05, 2023   cyanocobalamin 500 MCG tablet Commonly known as: VITAMIN B12 Take 1 tablet (500 mcg total) by mouth daily. Start taking on: June 05, 2023   esomeprazole 40 MG capsule Commonly known as: NEXIUM Take 40 mg by mouth daily before breakfast.   fluticasone 50 MCG/ACT nasal spray Commonly known as: FLONASE Place 2 sprays into both nostrils daily.   fluticasone-salmeterol 500-50 MCG/ACT Aepb Commonly known as: ADVAIR Inhale 1 puff into the lungs in the morning and at bedtime.   furosemide 40 MG tablet Commonly known as: LASIX Take 1 tablet (40 mg total) by mouth 2 (two) times daily.   hydrALAZINE 100 MG tablet Commonly known as: APRESOLINE Take 1 tablet (100 mg total) by mouth 3 (three) times daily.   ipratropium-albuterol 0.5-2.5 (3) MG/3ML Soln Commonly known as: DUONEB Take 3 mLs by nebulization every 6 (six) hours as needed (SOB).   levothyroxine 150 MCG tablet Commonly known as: SYNTHROID Take 150 mcg by mouth daily.   lidocaine 5 % Commonly known as: Lidoderm Place 1 patch onto the skin every 12 (twelve) hours. Remove & Discard patch within 12 hours or as directed by MD   loperamide 2 MG capsule Commonly known as: IMODIUM Take 1 capsule (2 mg total) by mouth as needed for diarrhea or loose stools.   metoprolol tartrate 50 MG tablet Commonly known as: LOPRESSOR Take 50 mg by mouth 2 (two) times daily.   montelukast 10 MG tablet Commonly known as: SINGULAIR Take 10 mg by mouth at bedtime.   potassium chloride 10 MEQ tablet Commonly known as: KLOR-CON Take 10 mEq by mouth daily.   rosuvastatin 40 MG tablet Commonly known as: CRESTOR Take 40 mg by mouth daily.   sertraline 100 MG tablet Commonly known as: ZOLOFT Take 100 mg by mouth daily.   Vitamin D (Ergocalciferol) 1.25 MG (50000 UNIT) Caps capsule Commonly known as: DRISDOL Take 1 capsule (50,000  Units total) by mouth every 7 (seven) days. Start taking on: June 10, 2023       Allergies  Allergen Reactions   Ace Inhibitors     Other reaction(s): Unknown   Egg-Derived Products Diarrhea   Other     Other reaction(s): Other (See Comments) Eggs   Prednisone     Other reaction(s): Other (See Comments) joint pain   Risedronate     Other reaction(s): Other (See Comments)   Sulfa Antibiotics Itching and Swelling    Other reaction(s): Other (See Comments)   Sulfasalazine Other (See Comments)   Discharge Instructions  Call MD for:   Complete by: As directed    GI bleeding   Call MD for:  difficulty breathing, headache or visual disturbances   Complete by: As directed    Call MD for:  extreme fatigue   Complete by: As directed    Call MD for:  persistant dizziness or light-headedness   Complete by: As directed    Call MD for:  persistant nausea and vomiting   Complete by: As directed    Call MD for:  severe uncontrolled pain   Complete by: As directed    Call MD for:  temperature >100.4   Complete by: As directed    Diet - low sodium heart healthy   Complete by: As directed    Discharge instructions   Complete by: As directed    Follow-up with PCP in 1 week Follow-up with nephrologist in 1 week, repeat BMP after 1 week to check renal functions and electrolytes. Follow with cardiologist in 1 to 2 weeks for Zio patch cardiac monitor.   Increase activity slowly   Complete by: As directed        The results of significant diagnostics from this hospitalization (including imaging, microbiology, ancillary and laboratory) are listed below for reference.    Significant Diagnostic Studies: DG Hand Complete Right  Result Date: 06/03/2023 CLINICAL DATA:  Right hand pain and swelling for a couple of days. EXAM: RIGHT HAND - COMPLETE 3+ VIEW COMPARISON:  None Available. FINDINGS: Mildly decreased bone mineralization. Mild thumb interphalangeal joint space narrowing and  dorsal lateral spurring. Mild-to-moderate second through fifth DIP and mild second through fifth PIP joint space narrowing and peripheral osteophytosis. Mild thumb carpometacarpal joint space narrowing and peripheral osteophytosis. Mild distal radioulnar joint space narrowing and peripheral spurring. No acute fracture or dislocation. 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 Electronically Signed   By: Neita Garnet M.D.   On: 06/03/2023 12:46   DG Chest 2 View  Result Date: 06/01/2023 CLINICAL DATA:  Chest pain EXAM: CHEST - 2 VIEW COMPARISON:  X-ray 04/16/2023 and older.  CT 04/18/2023 FINDINGS: Enlarged cardiopericardial silhouette. Calcified aorta. Interstitial changes. No pneumothorax or effusion. No consolidation. Osteopenia. Degenerative changes IMPRESSION: Enlarged cardiopericardial silhouette with interstitial changes. Acute process is possible. Recommend follow-up. Electronically Signed   By: Karen Kays M.D.   On: 06/01/2023 13:42    Microbiology: No results found for this or any previous visit (from the past 240 hour(s)).   Labs: CBC: Recent Labs  Lab 06/01/23 1300 06/02/23 0558 06/03/23 0531 06/04/23 0505  WBC 9.0 7.2 5.9 6.0  HGB 9.2* 8.7* 8.7* 8.9*  HCT 29.1* 26.1* 26.7* 28.0*  MCV 86.1 83.1 83.7 85.9  PLT 186 161 180 208   Basic Metabolic Panel: Recent Labs  Lab 06/01/23 1300 06/01/23 1529 06/02/23 0558 06/03/23 0531 06/04/23 0505  NA 138  --  139  138 136 139  K 3.6  --  3.2*  3.1* 3.4* 4.0  CL 102  --  102  101 102 107  CO2 21*  --  23  24 24 26   GLUCOSE 87  --  84  84 103* 113*  BUN 70*  --  70*  70* 75* 72*  CREATININE 2.26*  --  2.21*  2.21* 2.33* 2.33*  CALCIUM 7.0*  --  7.3*  7.3* 7.9* 8.8*  MG  --  0.9* 1.4* 2.3 2.2  PHOS  --  3.3 3.1 3.7 3.6   Liver Function Tests: Recent Labs  Lab  06/02/23 0558  ALBUMIN 3.3*   No results for input(s): "LIPASE", "AMYLASE" in the last 168 hours. No results for input(s): "AMMONIA"  in the last 168 hours. Cardiac Enzymes: No results for input(s): "CKTOTAL", "CKMB", "CKMBINDEX", "TROPONINI" in the last 168 hours. BNP (last 3 results) Recent Labs    06/16/22 0734 04/16/23 1700 06/01/23 1300  BNP 805.3* 273.4* 558.8*   CBG: No results for input(s): "GLUCAP" in the last 168 hours.  Time spent: 35 minutes  Signed:  Gillis Santa  [facility] 06/04/2023 1:11 PM

## 2023-06-04 NOTE — Progress Notes (Signed)
[facility]  ROUNDING NOTE   Subjective:   [name] is a 82 year old female who was admitted to [facility] on 06/01/2023 for Hypocalcemia [E83.51] Hypomagnesemia [E83.42] CHF (congestive heart failure) (HCC) [I50.9] Acute on chronic diastolic congestive heart failure (HCC) [I50.33]   She presents to the emergency department at the advice of the [facility] clinic for evaluation of chest pain, dizziness, shortness of breath, and right hand edema.  Patient is known to our practice and is followed outpatient by Dr. Cherylann Ratel.   No known fever or chills.  Reports shortness of breath on exertion, room air at baseline.    Patient remains close to baseline renal function.  Chest x-ray shows interstitial changes. Volume status is acceptable.  No leg edema.  Breathing comfortably on room air. Right hand swelling and pain is much improved   Objective:  Vital signs in last 24 hours:  Temp:  [97.5 F (36.4 C)-98.3 F (36.8 C)] 97.5 F (36.4 C) (09/02 0844) Pulse Rate:  [59-73] 63 (09/02 0844) Resp:  [16-20] 16 (09/02 0844) BP: (113-174)/(46-59) 160/55 (09/02 0844) SpO2:  [95 %-99 %] 97 % (09/02 0844)  Weight change:  Filed Weights   06/01/23 2040 06/02/23 0500 06/03/23 0500  Weight: 78.3 kg 78.3 kg 78.5 kg    Intake/Output: I/O last 3 completed shifts: In: -  Out: 1000 [Urine:1000]   Intake/Output this shift:  No intake/output data recorded.  Physical Exam: General: NAD, sitting at bedside  Head: Normocephalic, atraumatic. Moist oral mucosal membranes  Eyes: Anicteric  Neck: Supple  Lungs:  Clear to auscultation, normal effort, room air  Heart: Regular rate and rhythm  Abdomen:  Soft, nontender, obese  Extremities: Trace lower extremity peripheral edema.  Right hand nonpitting edema  Neurologic: Alert and oriented, moving all four extremities  Skin: No lesions       Basic Metabolic Panel: Recent Labs  Lab 06/01/23 1300 06/01/23 1529 06/02/23 0558 06/03/23 0531  06/04/23 0505  NA 138  --  139  138 136 139  K 3.6  --  3.2*  3.1* 3.4* 4.0  CL 102  --  102  101 102 107  CO2 21*  --  23  24 24 26   GLUCOSE 87  --  84  84 103* 113*  BUN 70*  --  70*  70* 75* 72*  CREATININE 2.26*  --  2.21*  2.21* 2.33* 2.33*  CALCIUM 7.0*  --  7.3*  7.3* 7.9* 8.8*  MG  --  0.9* 1.4* 2.3 2.2  PHOS  --  3.3 3.1 3.7 3.6    Liver Function Tests: Recent Labs  Lab 06/02/23 0558  ALBUMIN 3.3*   No results for input(s): "LIPASE", "AMYLASE" in the last 168 hours. No results for input(s): "AMMONIA" in the last 168 hours.  CBC: Recent Labs  Lab 06/01/23 1300 06/02/23 0558 06/03/23 0531 06/04/23 0505  WBC 9.0 7.2 5.9 6.0  HGB 9.2* 8.7* 8.7* 8.9*  HCT 29.1* 26.1* 26.7* 28.0*  MCV 86.1 83.1 83.7 85.9  PLT 186 161 180 208    Cardiac Enzymes: No results for input(s): "CKTOTAL", "CKMB", "CKMBINDEX", "TROPONINI" in the last 168 hours.  BNP: Invalid input(s): "POCBNP"  CBG: No results for input(s): "GLUCAP" in the last 168 hours.  Microbiology: Results for orders placed or performed during the hospital encounter of 04/16/23  SARS Coronavirus 2 by RT PCR (hospital order, performed in [facility] hospital lab) *cepheid single result test* Anterior Nasal Swab     Status:  None   Collection Time: 04/16/23  2:10 PM   Specimen: Anterior Nasal Swab  Result Value Ref Range Status   SARS Coronavirus 2 by RT PCR NEGATIVE NEGATIVE Final    Comment: (NOTE) SARS-CoV-2 target nucleic acids are NOT DETECTED.  The SARS-CoV-2 RNA is generally detectable in upper and lower respiratory specimens during the acute phase of infection. The lowest concentration of SARS-CoV-2 viral copies this assay can detect is 250 copies / mL. A negative result does not preclude SARS-CoV-2 infection and should not be used as the sole basis for treatment or other patient management decisions.  A negative result may occur with improper specimen collection / handling, submission of  specimen other than nasopharyngeal swab, presence of viral mutation(s) within the areas targeted by this assay, and inadequate number of viral copies (<250 copies / mL). A negative result must be combined with clinical observations, patient history, and epidemiological information.  Fact Sheet for Patients:   RoadLapTop.co.za  Fact Sheet for Healthcare Providers: http://kim-miller.com/  This test is not yet approved or  cleared by the [place] FDA and has been authorized for detection and/or diagnosis of SARS-CoV-2 by FDA under an Emergency Use Authorization (EUA).  This EUA will remain in effect (meaning this test can be used) for the duration of the COVID-19 declaration under Section 564(b)(1) of the Act, 21 U.S.C. section 360bbb-3(b)(1), unless the authorization is terminated or revoked sooner.  Performed at [facility], [address] [place], [place] [address]   Respiratory (~20 pathogens) panel by PCR     Status: None   Collection Time: 04/18/23  1:27 PM   Specimen: Nasopharyngeal Swab; Respiratory  Result Value Ref Range Status   Adenovirus NOT DETECTED NOT DETECTED Final   Coronavirus 229E NOT DETECTED NOT DETECTED Final    Comment: (NOTE) The Coronavirus on the Respiratory Panel, DOES NOT test for the novel  Coronavirus (2019 nCoV)    Coronavirus HKU1 NOT DETECTED NOT DETECTED Final   Coronavirus NL63 NOT DETECTED NOT DETECTED Final   Coronavirus OC43 NOT DETECTED NOT DETECTED Final   Metapneumovirus NOT DETECTED NOT DETECTED Final   Rhinovirus / Enterovirus NOT DETECTED NOT DETECTED Final   Influenza A NOT DETECTED NOT DETECTED Final   Influenza B NOT DETECTED NOT DETECTED Final   Parainfluenza Virus 1 NOT DETECTED NOT DETECTED Final   Parainfluenza Virus 2 NOT DETECTED NOT DETECTED Final   Parainfluenza Virus 3 NOT DETECTED NOT DETECTED Final   Parainfluenza Virus 4 NOT DETECTED NOT DETECTED Final    Respiratory Syncytial Virus NOT DETECTED NOT DETECTED Final   Bordetella pertussis NOT DETECTED NOT DETECTED Final   Bordetella Parapertussis NOT DETECTED NOT DETECTED Final   Chlamydophila pneumoniae NOT DETECTED NOT DETECTED Final   Mycoplasma pneumoniae NOT DETECTED NOT DETECTED Final    Comment: Performed at [facility], [address], [place], [place] [address]  Urine Culture (for pregnant, neutropenic or urologic patients or patients with an indwelling urinary catheter)     Status: Abnormal   Collection Time: 04/18/23  1:28 PM   Specimen: Urine, Clean Catch  Result Value Ref Range Status   Specimen Description   Final    URINE, CLEAN CATCH Performed at [facility], [address], [place], [place] [address]    Special Requests   Final    NONE Performed at [facility], [address] [place], [place] [address]    Culture MULTIPLE SPECIES PRESENT, SUGGEST RECOLLECTION (A)  Final  Report Status 04/19/2023 FINAL  Final  Expectorated Sputum Assessment w Gram Stain, Rflx to Resp Cult     Status: None   Collection Time: 04/19/23  2:00 PM   Specimen: Sputum  Result Value Ref Range Status   Specimen Description SPUTUM  Final   Special Requests NONE  Final   Sputum evaluation   Final    THIS SPECIMEN IS ACCEPTABLE FOR SPUTUM CULTURE Performed at [facility], [address] [place], [place] [address]    Report Status 04/19/2023 FINAL  Final  Culture, Respiratory w Gram Stain     Status: None   Collection Time: 04/19/23  2:00 PM   Specimen: SPU  Result Value Ref Range Status   Specimen Description   Final    SPUTUM Performed at [facility], [address] [place], [place] [address]    Special Requests   Final    NONE Reflexed from [id number] Performed at [facility], [address] [place], [place] [address]    Gram Stain   Final    FEW SQUAMOUS EPITHELIAL CELLS PRESENT FEW WBC PRESENT, PREDOMINANTLY  MONONUCLEAR MODERATE GRAM POSITIVE COCCI IN PAIRS AND CHAINS MODERATE GRAM NEGATIVE RODS FEW YEAST    Culture   Final    MODERATE Normal respiratory flora-no Staph aureus or Pseudomonas seen Performed at [facility], [address], [place], [place] [address]    Report Status 04/23/2023 FINAL  Final    Coagulation Studies: No results for input(s): "LABPROT", "INR" in the last 72 hours.  Urinalysis: No results for input(s): "COLORURINE", "LABSPEC", "PHURINE", "GLUCOSEU", "HGBUR", "BILIRUBINUR", "KETONESUR", "PROTEINUR", "UROBILINOGEN", "NITRITE", "LEUKOCYTESUR" in the last 72 hours.  Invalid input(s): "APPERANCEUR"    Imaging: DG Hand Complete Right  Result Date: 06/03/2023 CLINICAL DATA:  Right hand pain and swelling for a couple of days. EXAM: RIGHT HAND - COMPLETE 3+ VIEW COMPARISON:  None Available. FINDINGS: Mildly decreased bone mineralization. Mild thumb interphalangeal joint space narrowing and dorsal lateral spurring. Mild-to-moderate second through fifth DIP and mild second through fifth PIP joint space narrowing and peripheral osteophytosis. Mild thumb carpometacarpal joint space narrowing and peripheral osteophytosis. Mild distal radioulnar joint space narrowing and peripheral spurring. No acute fracture or dislocation. 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 Electronically Signed   By: Neita Garnet M.D.   On: 06/03/2023 12:46     Medications:     [START ON 06/05/2023] allopurinol  100 mg Oral Daily   amLODipine  5 mg Oral BID   apixaban  2.5 mg Oral BID   calcium carbonate  1 tablet Oral BID WC   [START ON 06/05/2023] colchicine  0.3 mg Oral Daily   vitamin B-12  500 mcg Oral Daily   furosemide  40 mg Oral BID   hydrALAZINE  100 mg Oral TID   levothyroxine  150 mcg Oral Daily   metoprolol tartrate  50 mg Oral BID   mometasone-formoterol  2 puff Inhalation BID   pantoprazole  40 mg Oral Daily   rosuvastatin  40 mg Oral Daily   sertraline   100 mg Oral Daily   Vitamin D (Ergocalciferol)  50,000 Units Oral Q7 days   acetaminophen **OR** acetaminophen, albuterol, hydrALAZINE, loperamide, ondansetron **OR** ondansetron (ZOFRAN) IV  Assessment/ Plan:  [name] is a 82 y.o.  female past medical conditions including diastolic heart failure, hypertension, hypothyroidism, COPD, and chronic kidney disease stage IV.  Patient has been admitted for Hypocalcemia [E83.51] Hypomagnesemia [E83.42] CHF (congestive  heart failure) (HCC) [I50.9] Acute on chronic diastolic congestive heart failure (HCC) [I50.33]   Chronic kidney disease stage IV with baseline creatinine 2.6 and GFR of 18 on 05/07/23.  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 Patient followed by Dr Cherylann Ratel.  Remains at baseline renal function. Will monitor during this admission and arrange outpatient follow-up.   Lab Results  Component Value Date   CREATININE 2.33 (H) 06/04/2023   CREATININE 2.33 (H) 06/03/2023   CREATININE 2.21 (H) 06/02/2023   CREATININE 2.21 (H) 06/02/2023   No intake or output data in the 24 hours ending 06/04/23 1051  2. Acute on chronic diastolic heart failure. BNP > 500. Mild lower extremity edema. Chest xray shows enlarged cardiopericardial silhouette with interstitial changes.  -Furosemide has been changed to 40 mg p.o. twice a day.  -Volume status is controlled at present.  3. Hypomagnesia, corrected to 2.2 today.  4. Hypocalcemia, history. IV calcium gluconate ordered in ED. Oral supplementation ordered by primary team.  Level is improving.  5. Edema, pain right hand. Questionable gout.  uric acid, 12.0.  - Colchicine 0.6mg  daily for 3 days.  Symptomatically improved -May be able to restart low-dose allopurinol 100 mg p.o. daily now that acute episode has resolved.   LOS: 3 Wendie Diskin 9/2/202410:51 AM

## 2023-06-05 ENCOUNTER — Telehealth: Payer: Self-pay

## 2023-06-05 ENCOUNTER — Ambulatory Visit: Payer: Medicare Other | Attending: Cardiovascular Disease

## 2023-06-05 DIAGNOSIS — I480 Paroxysmal atrial fibrillation: Secondary | ICD-10-CM

## 2023-06-05 NOTE — Telephone Encounter (Signed)
14 day heart monitor ordered as requested by Dr. Huel Coventry, Tollie Pizza, MD  P Cv Div Burl Triage Discharging from the hospital September 2, possibly September 3 Paroxysmal atrial fibrillation Can we send a Zio monitor to her house for 2 weeks Thx TG

## 2023-06-06 ENCOUNTER — Ambulatory Visit: Payer: Medicare Other | Attending: Cardiology | Admitting: Cardiology

## 2023-06-06 ENCOUNTER — Encounter: Payer: Self-pay | Admitting: Cardiology

## 2023-06-06 VITALS — BP 158/64 | HR 59 | Ht 62.0 in | Wt 171.6 lb

## 2023-06-06 DIAGNOSIS — M109 Gout, unspecified: Secondary | ICD-10-CM | POA: Insufficient documentation

## 2023-06-06 DIAGNOSIS — J449 Chronic obstructive pulmonary disease, unspecified: Secondary | ICD-10-CM | POA: Diagnosis present

## 2023-06-06 DIAGNOSIS — I10 Essential (primary) hypertension: Secondary | ICD-10-CM | POA: Insufficient documentation

## 2023-06-06 DIAGNOSIS — I480 Paroxysmal atrial fibrillation: Secondary | ICD-10-CM | POA: Insufficient documentation

## 2023-06-06 DIAGNOSIS — N184 Chronic kidney disease, stage 4 (severe): Secondary | ICD-10-CM | POA: Insufficient documentation

## 2023-06-06 DIAGNOSIS — E782 Mixed hyperlipidemia: Secondary | ICD-10-CM | POA: Insufficient documentation

## 2023-06-06 DIAGNOSIS — G4733 Obstructive sleep apnea (adult) (pediatric): Secondary | ICD-10-CM | POA: Diagnosis present

## 2023-06-06 DIAGNOSIS — E039 Hypothyroidism, unspecified: Secondary | ICD-10-CM | POA: Diagnosis present

## 2023-06-06 DIAGNOSIS — I5032 Chronic diastolic (congestive) heart failure: Secondary | ICD-10-CM | POA: Diagnosis present

## 2023-06-06 MED ORDER — AMLODIPINE BESYLATE 5 MG PO TABS: 5.0000 mg | ORAL_TABLET | Freq: Two times a day (BID) | ORAL | 11 refills | Status: DC

## 2023-06-06 NOTE — Patient Instructions (Signed)
Medication Instructions:  Your physician recommends the following medication changes.  INCREASE: Amlodipine 5 mg twice daily   *If you need a refill on your cardiac medications before your next appointment, please call your pharmacy*   Lab Work: none If you have labs (blood work) drawn today and your tests are completely normal, you will receive your results only by: MyChart Message (if you have MyChart) OR A paper copy in the mail If you have any lab test that is abnormal or we need to change your treatment, we will call you to review the results.   Testing/Procedures: Your physician has recommended that you wear a Zio monitor 14 days.   This monitor is a medical device that records the heart's electrical activity. Doctors most often use these monitors to diagnose arrhythmias. Arrhythmias are problems with the speed or rhythm of the heartbeat. The monitor is a small device applied to your chest. You can wear one while you do your normal daily activities. While wearing this monitor if you have any symptoms to push the button and record what you felt. Once you have worn this monitor for the period of time provider prescribed (Usually 14 days), you will return the monitor device in the postage paid box. Once it is returned they will download the data collected and provide [place] with a report which the provider will then review and we will call you with those results. Important tips:  Avoid showering during the first 24 hours of wearing the monitor. Avoid excessive sweating to help maximize wear time. Do not submerge the device, no hot tubs, and no swimming pools. Keep any lotions or oils away from the patch. After 24 hours you may shower with the patch on. Take brief showers with your back facing the shower head.  Do not remove patch once it has been placed because that will interrupt data and decrease adhesive wear time. Push the button when you have any symptoms and write down what you were  feeling. Once you have completed wearing your monitor, remove and place into box which has postage paid and place in your outgoing mailbox.  If for some reason you have misplaced your box then call our office and we can provide another box and/or mail it off for you.      Follow-Up: At [facility], you and your health needs are our priority.  As part of our continuing mission to provide you with exceptional heart care, we have created designated Provider Care Teams.  These Care Teams include your primary Cardiologist (physician) and Advanced Practice Providers (APPs -  Physician Assistants and Nurse Practitioners) who all work together to provide you with the care you need, when you need it.  We recommend signing up for the patient portal called "MyChart".  Sign up information is provided on this After Visit Summary.  MyChart is used to connect with patients for Virtual Visits (Telemedicine).  Patients are able to view lab/test results, encounter notes, upcoming appointments, etc.  Non-urgent messages can be sent to your provider as well.   To learn more about what you can do with MyChart, go to [email or web address].    Your next appointment:   6 week(s)  Provider:   Charlsie Quest, NP

## 2023-06-06 NOTE — Progress Notes (Signed)
Cardiology Office Note:  .   Date:  06/06/2023  ID:  [name], DOB 04-Nov-1940, MRN [id number] PCP: Lauro Regulus, MD  [facility] Providers Cardiologist:  None    History of Present Illness: [name]   [name] is a 82 y.o. 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  Patient previously undergone nuclear stress testing which showed moderate fixed inferior lateral defect with moderate reversibility and normal ejection fraction.  This was suggestive of a prior infarct and moderate peri-infarct ischemia.  Left heart catheterization in 2015 showed mild irregularities with no obstructive disease and normal LVEDP.  She was hospitalized at [facility] from 04/16/2023 - 04/21/2020 after presenting with generalized weakness and shortness of breath.  This was after a recent hospitalization from 6/21 through 6/25 due to hypocalcemia and hypomagnesemia.  During her most recent hospitalization she continued to have electrolyte abnormalities which are likely given today for correction of recent electrolyte abnormalities at her previous hospital admission.  She has chronic debility and PT advised home health and was planned upon her discharge.  She has acute on chronic kidney failure stage IV-V nephrology continues to follow.  While she was inpatient atrial fibrillation was noted on telemetry on 715 with max ventricular rate of 120 bpm.  It spontaneously resolved without intervention after correction of electrolytes.  Echocardiogram showed mild pulmonary hypertension and severe left atrial dilatation.  She was monitored on telemetry throughout the remainder of hospitalization without recurrence noted.  Anticoagulation was deferred until outpatient  cardiology follow-up.  She was considered stable and discharged subsequently on 04/22/2023.   Patient presented back to [facility] emergency department on 05/22/2023 with complaints of shortness of breath.  She reported 2 days of increasing difficulty with breathing and swelling to her right arm as well as both of her legs.  Blood pressure 154/57, pulse of 60, respirations of 20, temperature 98.4.  Labs pertinent for CO2 21, BUN of 70, serum creatinine of 2.26, calcium 7.0, estimated GFR 21, hemoglobin 9.2, BNP 558.8, magnesium 0.9, high-sensitivity troponin 36.  Again she was found to have atrial fibrillation with a rate of 106 and spontaneously converted.  She was subsequently placed on apixaban 2.5 mg twice daily for stroke prophylaxis with a CHA2DS2-VASc score of at least 6 with Lovenox discontinued as she met the reduced dosing criteria for age and kidney function.  Recommended a ZIO monitor on discharge to determine her burden of atrial fibrillation.  Unfortunately she was not considered a candidate for SGLT2 inhibitor or MRA nares due to kidney function.  She was subsequently considered stable and discharged home on 06/04/2023.  She returns to clinic today accompanied by her niece. She was just discharged from [facility] on 06/04/23. She states that she has been waking up in the night with her CPAP on thinking that her oxygen has been dropping during her sleep. Continues to have dyspnea on exertion that has unchanged, palpitations that are random but she states she has had them off and on for some time, she is concerned about potentially needing dialysis has upcoming follow-up with nephrology tomorrow.  She is positive for fatigue that is unchanged she relates that to her age.  Was recently started on apixaban at reduced dosing during her last hospitalization and has not  noticed any bleeding with blood or stool in her urine.  She is also concerned about blood pressure being elevated and medication changes that were made  during her recent hospitalization.  She denies chest pain, chest tightness, lightheadedness or dizziness.  States that she has remained compliant with her medications.  ROS: 10 point review of system has been reviewed and considered negative with exception of what is been listed in the HPI.  Studies Reviewed: [name]   EKG Interpretation Date/Time:  Wednesday June 06 2023 11:33:19 EDT Ventricular Rate:  59 PR Interval:  164 QRS Duration:  90 QT Interval:  456 QTC Calculation: 451 R Axis:   -19  Text Interpretation: Sinus bradycardia Moderate voltage criteria for LVH, may be normal variant ( R in aVL , Cornell product ) Possible Anterior infarct , age undetermined When compared with ECG of 02-Jun-2023 08:40, Sinus rhythm has replaced Atrial fibrillation Vent. rate has decreased BY  47 BPM Confirmed by Charlsie Quest ([id number]) on 06/06/2023 11:37:38 AM    TTE 04/19/2023 1. Left ventricular ejection fraction, by estimation, is 60 to 65%. The  left ventricle has normal function. The left ventricle has no regional  wall motion abnormalities. There is mild left ventricular hypertrophy.  Left ventricular diastolic parameters  are indeterminate.   2. Right ventricular systolic function is normal. The right ventricular  size is mildly enlarged. There is mildly elevated pulmonary artery  systolic pressure.   3. Left atrial size was severely dilated.   4. Right atrial size was mildly dilated.   5. The mitral valve is normal in structure. Mild mitral valve  regurgitation.   6. The aortic valve is tricuspid. Aortic valve regurgitation is not  visualized.   7. The inferior vena cava is normal in size with greater than 50%  respiratory variability, suggesting right atrial pressure of 3 mmHg.  Risk Assessment/Calculations:    CHA2DS2-VASc Score = 6   This indicates a 9.7% annual risk of stroke. The patient's score is based upon: CHF History: 1 HTN History: 1 Diabetes History: 1 Stroke History:  0 Vascular Disease History: 0 Age Score: 2 Gender Score: 1    HYPERTENSION CONTROL Vitals:   06/06/23 1122 06/06/23 1135  BP: (!) 174/63 (!) 158/64    The patient's blood pressure is elevated above target today.  In order to address the patient's elevated BP: A current anti-hypertensive medication was adjusted today.          Physical Exam:   VS:  BP (!) 158/64 (BP Location: Left Arm, Patient Position: Sitting, Cuff Size: Normal)   Pulse (!) 59   Ht 5\' 2"  (1.575 m)   Wt 171 lb 9.6 oz (77.8 kg)   SpO2 96%   BMI 31.39 kg/m    Wt Readings from Last 3 Encounters:  06/06/23 171 lb 9.6 oz (77.8 kg)  06/03/23 173 lb (78.5 kg)  05/28/23 176 lb 6.4 oz (80 kg)    GEN: Well nourished, well developed in no acute distress NECK: No JVD; No carotid bruits CARDIAC: RRR, II/VI systolic murmur without rubs or gallops RESPIRATORY:  Clear to auscultation without rales, wheezing or rhonchi  ABDOMEN: Soft, non-tender, non-distended EXTREMITIES:  No edema; No deformity   ASSESSMENT AND PLAN: .   New onset paroxysmal atrial fibrillation found during hospitalization in July and now her most recent hospitalization.  She subsequently converted back to sinus.  EKG today reveals sinus bradycardia with a rate of 59 which is at no acute change from prior  studies that she had checked.  She is continued on apixaban 2.5 mg twice daily for CHA2DS2-VASc score of at least 6 for stroke prophylaxis.  She is on reduced dosing based on age and kidney function.  She is also being placed on a ZIO XT monitor for 2 weeks to determine atrial fibrillation burden and she complains of palpitations regularly she is continued on metoprolol tartrate 50 mg twice daily.  Chronic HFpEF which she has recently been hospitalized several times and presented with shortness of breath.  LVEF on last echocardiogram was 60-65%, no RWMA, mitral regurgitation.  She is continued on 40 mg of furosemide twice daily.  She is not a candidate for  SGLT2 inhibitor or MRA due to kidney function.  Continue with heart failure education.  She is encouraged to weigh herself daily, have no more than 1-1/2 L with fluid in a day and maintain a low-sodium diet.  CKD stage IV follow-up with anemia of chronic disease secondary hyperparathyroidism with a serum creatinine of 2.3.  She has had electrolyte derangements during recent hospitalization requiring electrolytes to be replaced.  She has blood work scheduled with her nephrologist tomorrow so we will defer doing blood work today.  Hemoglobin was stable on discharge at 8.7.  She did has not noted any bleeding since being started on OAC of Eliquis 2.5 mg twice daily.  Primary hypertension with blood pressure today 174/62 with a recheck of 138/64.  She is continued on amlodipine 5 mg with increasing up to twice daily, furosemide 40 mg twice daily, metoprolol titrate 50 mg twice daily, and hydralazine 100 mg 3 times daily.  She has been encouraged to continue to monitor blood pressures at home 1 to 2 hours postmedication administration.  Mixed hyperlipidemia with an LDL of 69.  She is continued on rosuvastatin 40 mg daily.  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  She is continued on colchicine.  This will continue to need to be monitored by her PCP.  Obstructive sleep apnea with a CPAP.  Patient states that she has not had follow-up studies or notified of machine status.  She is requesting assistance with determining if she needs oxygen through her CPAP.  She has been referred to pulmonary for management of her obstructive sleep apnea.  Hypothyroidism where she is continued on levothyroxine.  This continues to be followed by her PCP.  COPD where she has been continued on bronchodilators.  Recent referral just sent to pulmonary for obstructive sleep apnea and continued management of COPD will be recommended.       Dispo: Patient to return to clinic to see MD/APP in 6 weeks or sooner if  needed  Signed, Coleson Kant, NP

## 2023-06-07 DIAGNOSIS — I480 Paroxysmal atrial fibrillation: Secondary | ICD-10-CM

## 2023-06-13 ENCOUNTER — Telehealth: Payer: Self-pay | Admitting: Cardiovascular Disease

## 2023-06-13 ENCOUNTER — Encounter: Payer: Self-pay | Admitting: Internal Medicine

## 2023-06-13 NOTE — Telephone Encounter (Signed)
STAT if HR is under 50 or over 120 (normal HR is 60-100 beats per minute)  What is your heart rate? 53  Do you have a log of your heart rate readings (document readings)?   Do you have any other symptoms? Difficulty breathing and SOB at night. Patient states that will wake up 3 times during the night because of the difficulty breathing. Patient stated she has been feeling light headed and dizziness. Please advise.

## 2023-06-13 NOTE — Telephone Encounter (Signed)
Spoke to patient and she stated that she feels dehydrated. Asked patient is she hydrating according to her nephrologist's recommendations and she stated that she is and that he said she can drink as much water as she likes. Asked patient if her shortness of breath is the same or worsening. Patient stated that it is the same, instructed patient to present to the nearest emergency department if she feels it is worsening. Patient stated that she would go to the ED if it got worse. Instructed patient of the following:  Find position of greatest comfort. For most patients the best position is semi-upright (e.g., sitting up in a comfortable chair or lying back against pillows). * Elevate head of bed (e.g., use pillows or place blocks under bed). * Avoid smoke or fume exposure. * Create a draft (e.g., use a fan directed at the face, or open a window). * Keep room temperature slightly on the cool side. * Limit activities or space activities apart during the day. Prioritize activities. * Use a humidifier.   Patient understood with read back

## 2023-06-15 ENCOUNTER — Ambulatory Visit: Payer: Medicare Other | Admitting: Family Medicine

## 2023-07-22 ENCOUNTER — Emergency Department: Payer: Medicare Other

## 2023-07-22 ENCOUNTER — Other Ambulatory Visit: Payer: Self-pay

## 2023-07-22 DIAGNOSIS — Z7901 Long term (current) use of anticoagulants: Secondary | ICD-10-CM | POA: Diagnosis not present

## 2023-07-22 DIAGNOSIS — Y92812 Truck as the place of occurrence of the external cause: Secondary | ICD-10-CM | POA: Insufficient documentation

## 2023-07-22 DIAGNOSIS — S8011XA Contusion of right lower leg, initial encounter: Secondary | ICD-10-CM | POA: Insufficient documentation

## 2023-07-22 DIAGNOSIS — W228XXA Striking against or struck by other objects, initial encounter: Secondary | ICD-10-CM | POA: Insufficient documentation

## 2023-07-22 DIAGNOSIS — S8991XA Unspecified injury of right lower leg, initial encounter: Secondary | ICD-10-CM | POA: Diagnosis present

## 2023-07-22 NOTE — ED Triage Notes (Signed)
Patient injured her RIGHT ankle / lower leg trying to get into a trunk tonight; She has ice and an ace wrap around her extremity at this time

## 2023-07-23 ENCOUNTER — Emergency Department
Admission: EM | Admit: 2023-07-23 | Discharge: 2023-07-23 | Disposition: A | Discharge status: Home / Self Care | Payer: Medicare Other | Attending: Emergency Medicine | Admitting: Emergency Medicine

## 2023-07-23 DIAGNOSIS — S8011XA Contusion of right lower leg, initial encounter: Secondary | ICD-10-CM

## 2023-07-23 NOTE — ED Provider Notes (Signed)
[facility] Provider Note    Event Date/Time   First MD Initiated Contact with Patient 07/23/23 0036     (approximate)   History   Ankle Injury   HPI [name] is a 82 y.o. female who presents for evaluation of pain in her right lower leg and ankle.  She said she was trying to get into her truck and she slipped and banged the outside of her right lower leg and ankle on the running board.  She has swelling and bruising and it is painful to the touch.  She says she is on a blood thinner (the chart indicates Eliquis) so she was worried that she might be bleeding on the inside.  She is able to bear weight and ambulate although it hurts to do so.  No other injuries.     Physical Exam   Triage Vital Signs: ED Triage Vitals  Encounter Vitals Group     BP 07/22/23 2151 (!) 187/53     Systolic BP Percentile --      Diastolic BP Percentile --      Pulse Rate 07/22/23 2151 (!) 55     Resp 07/22/23 2151 17     Temp 07/22/23 2151 97.6 F (36.4 C)     Temp src --      SpO2 07/22/23 2151 97 %     Weight 07/22/23 2154 77.8 kg (171 lb 8.3 oz)     Height 07/22/23 2154 1.575 m (5\' 2" )     Head Circumference --      Peak Flow --      Pain Score 07/22/23 2154 9     Pain Loc --      Pain Education --      Exclude from Growth Chart --     Most recent vital signs: Vitals:   07/22/23 2151 07/23/23 0110  BP: (!) 187/53 (!) 179/68  Pulse: (!) 55 63  Resp: 17 18  Temp: 97.6 F (36.4 C)   SpO2: 97% 99%    General: Awake, no distress.  CV:  Good peripheral perfusion.  Resp:  Normal effort. Speaking easily and comfortably, no accessory muscle usage nor intercostal retractions.   Abd:  No distention.  Other:  Swelling and ecchymosis of the right lower leg from just inferior to the knee to and including the ankle.  Compartments are soft and easily compressible and there is mild tenderness to palpation.  Consistent with contusion and hematoma.  Normal range of  motion of ankle and nontender foot.   ED Results / Procedures / Treatments   Labs (all labs ordered are listed, but only abnormal results are displayed) Labs Reviewed - No data to display   RADIOLOGY I viewed and interpreted the patient's ankle x-rays and tibia/fibula x-rays.  Soft tissue swelling without fracture or dislocation.  Confirmed by radiologist reports.   PROCEDURES:  Critical Care performed: No  Procedures    IMPRESSION / MDM / ASSESSMENT AND PLAN / ED COURSE  I reviewed the triage vital signs and the nursing notes.                              Differential diagnosis includes, but is not limited to, contusion, hematoma, active extravasation, compartment syndrome, fracture, dislocation.  Patient's presentation is most consistent with acute presentation with potential threat to life or bodily function.  Labs/studies ordered: Ankle x-ray, tibia/fibular x-ray  Interventions/Medications given:  Medications - No data to display  (Note:  hospital course my include additional interventions and/or labs/studies not listed above.)   Reassuring exam, no indication of emergent condition.  Patient has a hematoma and contusion but is able to bear weight and pain is well-controlled, not requiring analgesia.  No indication of compartment syndrome.  Appropriate for Ace wrap to provide gentle pressure and I talked with her about RICE.  Patient and husband are comfortable with discharge and outpatient follow-up.  I gave strict return precautions should she develop new or worsening symptoms that concern her.         FINAL CLINICAL IMPRESSION(S) / ED DIAGNOSES   Final diagnoses:  Contusion of right lower leg, initial encounter     Rx / DC Orders   ED Discharge Orders     None        Note:  This document was prepared using Dragon voice recognition software and may include unintentional dictation errors.   Loleta Rose, MD 07/23/23 [phone]

## 2023-07-23 NOTE — Discharge Instructions (Signed)
Please use the provided elastic wrap (ACE bandage) to provide gentle pressure on your right lower leg, ankle, and foot.  This will help decrease the swelling, or keep it from getting worse.  Keep your leg elevated when possible, and use cold packs to help with the swelling.  Read through the included information.  Follow up with your regular doctor at the next available opportunity.

## 2023-07-24 ENCOUNTER — Ambulatory Visit: Payer: Medicare Other | Attending: Cardiology | Admitting: Cardiology

## 2023-07-24 ENCOUNTER — Encounter: Payer: Self-pay | Admitting: Cardiology

## 2023-07-24 VITALS — BP 142/51 | HR 52 | Ht 62.0 in | Wt 167.0 lb

## 2023-07-24 DIAGNOSIS — I480 Paroxysmal atrial fibrillation: Secondary | ICD-10-CM | POA: Insufficient documentation

## 2023-07-24 DIAGNOSIS — J449 Chronic obstructive pulmonary disease, unspecified: Secondary | ICD-10-CM | POA: Diagnosis present

## 2023-07-24 DIAGNOSIS — S99911S Unspecified injury of right ankle, sequela: Secondary | ICD-10-CM | POA: Insufficient documentation

## 2023-07-24 DIAGNOSIS — N184 Chronic kidney disease, stage 4 (severe): Secondary | ICD-10-CM | POA: Diagnosis present

## 2023-07-24 DIAGNOSIS — E039 Hypothyroidism, unspecified: Secondary | ICD-10-CM | POA: Insufficient documentation

## 2023-07-24 DIAGNOSIS — G4733 Obstructive sleep apnea (adult) (pediatric): Secondary | ICD-10-CM | POA: Diagnosis present

## 2023-07-24 DIAGNOSIS — E782 Mixed hyperlipidemia: Secondary | ICD-10-CM | POA: Insufficient documentation

## 2023-07-24 DIAGNOSIS — I10 Essential (primary) hypertension: Secondary | ICD-10-CM | POA: Insufficient documentation

## 2023-07-24 DIAGNOSIS — I5032 Chronic diastolic (congestive) heart failure: Secondary | ICD-10-CM | POA: Insufficient documentation

## 2023-07-24 NOTE — Progress Notes (Signed)
Cardiology Office Note:  .   Date:  07/24/2023  ID:  [name], DOB [birth year], MRN [id number] PCP: Lauro Regulus, MD  [facility] Providers Cardiologist:  None    History of Present Illness: Marland Kitchen   [name] is a 82 y.o. 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  Previous stress testing showed moderate fixed inferior lateral defect with moderate reversibility normal ejection fraction.  This was suggestive of prior infarct and moderate peri-infarct ischemia.  Left heart catheterization in 2015 showed mild irregularities with no obstructive disease and normal LVEDP.  She was hospitalized at [facility] from 7/15 - 04/22/23 after presenting with generalized weakness and shortness of breath.  This is after recent hospitalization from 621-03/31/2023 due to hypocalcemia and hypomagnesemia.  During recent hospitalization she continued to have electrolyte abnormalities.  She is acute on chronic kidney failure stage IV and V but continues to follow with nephrology.  While she was inpatient atrial fibrillation was noted on telemetry with maximum ventricular rate of 120 bpm.  Spontaneously resolved without intervention after correction of electrolytes.  Echocardiogram showed mild pulmonary hypertension and severe left atrial dilatation.  She was monitored on telemetry throughout the remainder of her hospitalization without recurrence noted.  Anticoagulation was deferred until outpatient cardiology follow-up.  She was considered stable for discharge and discharged on 04/22/2023.  She presented back to the [facility] emergency department on 05/22/2023 with complaints of shortness of breath.  She was found to  have atrial fibrillation with rate of 106 and spontaneously converted.  She was placed on apixaban 2.5 mg twice daily for stroke prophylaxis with a CHA2DS2-VASc score of at least 6.  Recommended a ZIO monitor on discharge to determine her burden of atrial fibrillation.  Unfortunately she is not considered a candidate for SGLT 2 inhibitors or MRA due to kidney function.  She was discharged on 06/04/2023.  She was seen in clinic 06/06/2023.  At that time she was placed on a ZIO XT monitor for 2 weeks to determine atrial fibrillation burden.  She continued to have complaints of palpitations regularly with continued on metoprolol titrate 50 mg twice daily.  She would need to be on uninterrupted anticoagulation for minimum of 4 weeks and then could discuss possible cardioversion if she remained in atrial fibrillation.  She presented to the [facility] emergency department 07/23/2023 with complaints of an ankle injury.  She states she was trying to get into her truck and she slipped and banged the outside of her right lower leg and ankle on the running board.  Swelling and bruising and painful to the touch.  She was on blood thinners so she was worried she might be bleeding.  Blood pressure was suboptimally controlled at 187/53, pulse of 55, respirations of 17, temperature 97.6.  No acute osseous changes noted to imaging.  She was advised last to follow RICE (rest, ice, compression, elevation,).  As well as outpatient follow-up.  She returns to clinic today accompanied by her husband.  She states today that she has not been doing well.  She has shaky, severe fatigue, and is overall having generalized weakness, and continued pain to the right lower extremity.  Was recently evaluated in the emergency department after entering on the  trunk concerning that she had broken her ankle.  She comes in with a wrap today and complains of continued discomfort and swelling.  States she also recently just had blood work completed by her  nephrologist as there was concern for worsening kidney function.  She denies any chest pain, chest tightness, lightheadedness/dizziness, or worsening shortness of breath.  She does note some swelling to the right lower extremity with large amount of bruising under her Ace wraps.  She has been compliant with her apixaban with no missed doses.  She has not noted any bleeding or blood noted in her urine or stool.  States that she has been compliant with rest of her medications as well.  ROS: 10 point review of systems has been reviewed and considered negative with exception what is been listed in the HPI  Studies Reviewed: Marland Kitchen   EKG Interpretation Date/Time:  Tuesday July 24 2023 15:19:46 EDT Ventricular Rate:  52 PR Interval:  156 QRS Duration:  96 QT Interval:  462 QTC Calculation: 429 R Axis:   -19  Text Interpretation: Sinus bradycardia Moderate voltage criteria for LVH, may be normal variant ( R in aVL , Cornell product ) Nonspecific ST abnormality When compared with ECG of 06-Jun-2023 11:33, No significant change was found Confirmed by Charlsie Quest ([id number]) on 07/24/2023 3:20:59 PM    TTE 04/19/2023 1. Left ventricular ejection fraction, by estimation, is 60 to 65%. The  left ventricle has normal function. The left ventricle has no regional  wall motion abnormalities. There is mild left ventricular hypertrophy.  Left ventricular diastolic parameters  are indeterminate.   2. Right ventricular systolic function is normal. The right ventricular  size is mildly enlarged. There is mildly elevated pulmonary artery  systolic pressure.   3. Left atrial size was severely dilated.   4. Right atrial size was mildly dilated.   5. The mitral valve is normal in structure. Mild mitral valve  regurgitation.   6. The aortic valve is tricuspid. Aortic valve regurgitation is not  visualized.   7. The inferior vena cava is normal in size with greater than 50%  respiratory variability, suggesting  right atrial pressure of 3 mmHg.  Risk Assessment/Calculations:    CHA2DS2-VASc Score = 6   This indicates a 9.7% annual risk of stroke. The patient's score is based upon: CHF History: 1 HTN History: 1 Diabetes History: 1 Stroke History: 0 Vascular Disease History: 0 Age Score: 2 Gender Score: 1          Physical Exam:   VS:  BP (!) 142/51 (BP Location: Left Arm, Patient Position: Sitting, Cuff Size: Normal)   Pulse (!) 52   Ht 5\' 2"  (1.575 m)   Wt 167 lb (75.8 kg)   SpO2 95%   BMI 30.54 kg/m    Wt Readings from Last 3 Encounters:  07/24/23 167 lb (75.8 kg)  07/22/23 171 lb 8.3 oz (77.8 kg)  06/06/23 171 lb 9.6 oz (77.8 kg)    GEN: Well nourished, well developed in no acute distress NECK: No JVD; No carotid bruits CARDIAC: RRR, II/VI systolic murmur without rubs or gallops RESPIRATORY:  Clear to auscultation without rales, wheezing or rhonchi  ABDOMEN: Soft, non-tender, non-distended EXTREMITIES: 1+ edema to the right lower extremity with large quantity of bruising, leg was rewrapped with Ace wrap, trace pretibial edema noted to the left lower extremity; No deformity   ASSESSMENT AND PLAN: .   Paroxysmal atrial fibrillation found during hospitalization in July was noted  to be in sinus bradycardia today with a rate of 50 to as well as LVH with no significant change noted from prior studies.  With her complaints of fatigue today her metoprolol has been decreased to 25 mg twice daily.  She is continued on apixaban 2.5 mg twice daily for CHA2DS2-VASc score of at least 6 for stroke prophylaxis.  Chronic HFpEF with last LVEF 60 to 65%, no RWMA, mitral regurgitation.  She is continued on 40 mg of furosemide twice daily.  She is not currently a candidate for SGLT2 inhibitor or MRA due to kidney function.  Continue with heart failure education.  She is encouraged to continue to weigh himself daily, no more than 2 L of fluid in a day and maintain a low-sodium diet.  She appears to be  overall euvolemic on exam today.  On return if she was tolerating reduced dose of metoprolol to tartrate can consolidate Toprol-XL.  Primary hypertension with blood pressure today 142/51.  Patient is visibly uncomfortable.  She is continued on amlodipine 5 mg twice daily, furosemide 40 mg twice daily, hydralazine 100 mg 3 times a day, metoprolol decreased to 25 mg twice daily.  She has been encouraged to continue to monitor blood pressures at home as well 1 to 2 hours postmedication administration.  Mixed hyperlipidemia with an LDL of 69.  She is continued on rosuvastatin 40 mg daily.  CKD stage IV and anemia of chronic kidney disease secondary hyperparathyroidism with a serum creatinine that is increased to 3.71 on labs on 07/19/2023.  She continues to follow with nephrology for her anemia of chronic disease her hemoglobin was last 9.5 which is slight improvement but she has an upcoming appointment with hematology.  She is also not noted any bleeding or concern for bleeding with blood noted in her stool or urine since starting oral anticoagulation of apixaban 2.5 mg daily.  Obstructive sleep apnea with a CPAP.  She has to follow-up with pulmonary for management of her obstructive sleep apnea.  Hypothyroidism where she is continued on levothyroxine managed by her PCP.  COPD where she is continued on bronchodilators.  She is to follow-up with pulmonary for continued management of COPD.  Soft tissue injury to the right lower extremity.  She was recently evaluated in the emergency department with x-ray revealing nondisplaced fractures.  She has been advised to continue with conservative therapy of rhinitis which is rest, ice, compression, and elevation.  She has also been encouraged to take Tylenol for discomfort.       Dispo: Patient to return to clinic to see MD/APP in 4 weeks or sooner if needed to reevaluate symptoms after medication changes made today.  Signed, Wynetta Seith, NP

## 2023-07-24 NOTE — Patient Instructions (Signed)
Medication Instructions:  Your physician has recommended you make the following change in your medication:   Decrease - metoprolol tartrate (LOPRESSOR) 50 MG tablet to half tablet (25 mg) 2 times daily  *If you need a refill on your cardiac medications before your next appointment, please call your pharmacy*  Lab Work: -None ordered  Testing/Procedures: -None ordered  Follow-Up: At [facility], you and your health needs are our priority.  As part of our continuing mission to provide you with exceptional heart care, we have created designated Provider Care Teams.  These Care Teams include your primary Cardiologist (physician) and Advanced Practice Providers (APPs -  Physician Assistants and Nurse Practitioners) who all work together to provide you with the care you need, when you need it.  Your next appointment:   4 week(s)  Provider:   Charlsie Quest, NP    Other Instructions -None

## 2023-07-29 ENCOUNTER — Emergency Department: Payer: Medicare Other

## 2023-07-29 ENCOUNTER — Other Ambulatory Visit: Payer: Self-pay

## 2023-07-29 ENCOUNTER — Inpatient Hospital Stay
Admission: EM | Admit: 2023-07-29 | Discharge: 2023-08-06 | DRG: 264 | Disposition: A | Discharge status: Home Health | Payer: Medicare Other | Attending: Osteopathic Medicine | Admitting: Osteopathic Medicine

## 2023-07-29 DIAGNOSIS — K5900 Constipation, unspecified: Secondary | ICD-10-CM | POA: Diagnosis not present

## 2023-07-29 DIAGNOSIS — N189 Chronic kidney disease, unspecified: Secondary | ICD-10-CM

## 2023-07-29 DIAGNOSIS — K7581 Nonalcoholic steatohepatitis (NASH): Secondary | ICD-10-CM | POA: Diagnosis present

## 2023-07-29 DIAGNOSIS — I2489 Other forms of acute ischemic heart disease: Secondary | ICD-10-CM | POA: Diagnosis present

## 2023-07-29 DIAGNOSIS — N184 Chronic kidney disease, stage 4 (severe): Secondary | ICD-10-CM | POA: Diagnosis present

## 2023-07-29 DIAGNOSIS — Z85828 Personal history of other malignant neoplasm of skin: Secondary | ICD-10-CM

## 2023-07-29 DIAGNOSIS — E890 Postprocedural hypothyroidism: Secondary | ICD-10-CM | POA: Diagnosis present

## 2023-07-29 DIAGNOSIS — I2510 Atherosclerotic heart disease of native coronary artery without angina pectoris: Secondary | ICD-10-CM | POA: Diagnosis present

## 2023-07-29 DIAGNOSIS — I480 Paroxysmal atrial fibrillation: Secondary | ICD-10-CM | POA: Diagnosis not present

## 2023-07-29 DIAGNOSIS — Z8249 Family history of ischemic heart disease and other diseases of the circulatory system: Secondary | ICD-10-CM

## 2023-07-29 DIAGNOSIS — L03115 Cellulitis of right lower limb: Secondary | ICD-10-CM | POA: Diagnosis present

## 2023-07-29 DIAGNOSIS — N179 Acute kidney failure, unspecified: Secondary | ICD-10-CM | POA: Diagnosis present

## 2023-07-29 DIAGNOSIS — I5032 Chronic diastolic (congestive) heart failure: Secondary | ICD-10-CM | POA: Diagnosis present

## 2023-07-29 DIAGNOSIS — Z79899 Other long term (current) drug therapy: Secondary | ICD-10-CM

## 2023-07-29 DIAGNOSIS — I130 Hypertensive heart and chronic kidney disease with heart failure and stage 1 through stage 4 chronic kidney disease, or unspecified chronic kidney disease: Secondary | ICD-10-CM | POA: Diagnosis present

## 2023-07-29 DIAGNOSIS — Z87442 Personal history of urinary calculi: Secondary | ICD-10-CM

## 2023-07-29 DIAGNOSIS — I4891 Unspecified atrial fibrillation: Principal | ICD-10-CM | POA: Diagnosis present

## 2023-07-29 DIAGNOSIS — K219 Gastro-esophageal reflux disease without esophagitis: Secondary | ICD-10-CM | POA: Diagnosis present

## 2023-07-29 DIAGNOSIS — W19XXXA Unspecified fall, initial encounter: Secondary | ICD-10-CM | POA: Diagnosis present

## 2023-07-29 DIAGNOSIS — Z882 Allergy status to sulfonamides status: Secondary | ICD-10-CM

## 2023-07-29 DIAGNOSIS — Z91012 Allergy to eggs: Secondary | ICD-10-CM

## 2023-07-29 DIAGNOSIS — Z7901 Long term (current) use of anticoagulants: Secondary | ICD-10-CM

## 2023-07-29 DIAGNOSIS — S8011XA Contusion of right lower leg, initial encounter: Secondary | ICD-10-CM | POA: Diagnosis present

## 2023-07-29 DIAGNOSIS — E1122 Type 2 diabetes mellitus with diabetic chronic kidney disease: Secondary | ICD-10-CM | POA: Diagnosis present

## 2023-07-29 DIAGNOSIS — E876 Hypokalemia: Secondary | ICD-10-CM | POA: Diagnosis not present

## 2023-07-29 DIAGNOSIS — D631 Anemia in chronic kidney disease: Secondary | ICD-10-CM | POA: Diagnosis present

## 2023-07-29 DIAGNOSIS — M810 Age-related osteoporosis without current pathological fracture: Secondary | ICD-10-CM | POA: Diagnosis present

## 2023-07-29 DIAGNOSIS — Z888 Allergy status to other drugs, medicaments and biological substances status: Secondary | ICD-10-CM

## 2023-07-29 DIAGNOSIS — Z6830 Body mass index (BMI) 30.0-30.9, adult: Secondary | ICD-10-CM

## 2023-07-29 DIAGNOSIS — Z7951 Long term (current) use of inhaled steroids: Secondary | ICD-10-CM

## 2023-07-29 DIAGNOSIS — Z79890 Hormone replacement therapy (postmenopausal): Secondary | ICD-10-CM

## 2023-07-29 DIAGNOSIS — Z9049 Acquired absence of other specified parts of digestive tract: Secondary | ICD-10-CM

## 2023-07-29 DIAGNOSIS — R7989 Other specified abnormal findings of blood chemistry: Secondary | ICD-10-CM

## 2023-07-29 DIAGNOSIS — T502X5A Adverse effect of carbonic-anhydrase inhibitors, benzothiadiazides and other diuretics, initial encounter: Secondary | ICD-10-CM | POA: Diagnosis present

## 2023-07-29 DIAGNOSIS — J4489 Other specified chronic obstructive pulmonary disease: Secondary | ICD-10-CM | POA: Diagnosis present

## 2023-07-29 DIAGNOSIS — E785 Hyperlipidemia, unspecified: Secondary | ICD-10-CM | POA: Diagnosis present

## 2023-07-29 DIAGNOSIS — E669 Obesity, unspecified: Secondary | ICD-10-CM | POA: Diagnosis present

## 2023-07-29 DIAGNOSIS — Z90711 Acquired absence of uterus with remaining cervical stump: Secondary | ICD-10-CM

## 2023-07-29 DIAGNOSIS — J449 Chronic obstructive pulmonary disease, unspecified: Secondary | ICD-10-CM | POA: Diagnosis present

## 2023-07-29 DIAGNOSIS — L089 Local infection of the skin and subcutaneous tissue, unspecified: Secondary | ICD-10-CM

## 2023-07-29 DIAGNOSIS — E039 Hypothyroidism, unspecified: Secondary | ICD-10-CM | POA: Diagnosis present

## 2023-07-29 DIAGNOSIS — I5033 Acute on chronic diastolic (congestive) heart failure: Secondary | ICD-10-CM | POA: Diagnosis present

## 2023-07-29 DIAGNOSIS — D649 Anemia, unspecified: Secondary | ICD-10-CM

## 2023-07-29 DIAGNOSIS — Z803 Family history of malignant neoplasm of breast: Secondary | ICD-10-CM

## 2023-07-29 DIAGNOSIS — G4733 Obstructive sleep apnea (adult) (pediatric): Secondary | ICD-10-CM

## 2023-07-29 LAB — CBC WITH DIFFERENTIAL/PLATELET
Abs Immature Granulocytes: 0.03 10*3/uL (ref 0.00–0.07)
Basophils Absolute: 0 10*3/uL (ref 0.0–0.1)
Basophils Relative: 1 %
Eosinophils Absolute: 0.1 10*3/uL (ref 0.0–0.5)
Eosinophils Relative: 1 %
HCT: 26.1 % — ABNORMAL LOW (ref 36.0–46.0)
Hemoglobin: 8.5 g/dL — ABNORMAL LOW (ref 12.0–15.0)
Immature Granulocytes: 0 %
Lymphocytes Relative: 18 %
Lymphs Abs: 1.6 10*3/uL (ref 0.7–4.0)
MCH: 27.6 pg (ref 26.0–34.0)
MCHC: 32.6 g/dL (ref 30.0–36.0)
MCV: 84.7 fL (ref 80.0–100.0)
Monocytes Absolute: 0.9 10*3/uL (ref 0.1–1.0)
Monocytes Relative: 11 %
Neutro Abs: 6.2 10*3/uL (ref 1.7–7.7)
Neutrophils Relative %: 69 %
Platelets: 204 10*3/uL (ref 150–400)
RBC: 3.08 MIL/uL — ABNORMAL LOW (ref 3.87–5.11)
RDW: 16.5 % — ABNORMAL HIGH (ref 11.5–15.5)
WBC: 8.9 10*3/uL (ref 4.0–10.5)
nRBC: 0 % (ref 0.0–0.2)

## 2023-07-29 LAB — PROTIME-INR
INR: 1.6 — ABNORMAL HIGH (ref 0.8–1.2)
Prothrombin Time: 18.8 s — ABNORMAL HIGH (ref 11.4–15.2)

## 2023-07-29 NOTE — ED Triage Notes (Signed)
Pt to ED via EMS from home, pt c/o shortness of breath since earlier today. Pt is currently being treated for anemia at the cancer center here. Pt denies chest pain, per ems pt with elevated heart rate 160s. Pt reports using albuterol inhaler 30 min PTA.

## 2023-07-30 ENCOUNTER — Emergency Department: Payer: Medicare Other

## 2023-07-30 ENCOUNTER — Other Ambulatory Visit: Payer: Self-pay

## 2023-07-30 ENCOUNTER — Inpatient Hospital Stay: Payer: Medicare Other | Admitting: Internal Medicine

## 2023-07-30 ENCOUNTER — Encounter: Payer: Self-pay | Admitting: Internal Medicine

## 2023-07-30 ENCOUNTER — Inpatient Hospital Stay: Payer: Medicare Other

## 2023-07-30 DIAGNOSIS — G4733 Obstructive sleep apnea (adult) (pediatric): Secondary | ICD-10-CM | POA: Diagnosis present

## 2023-07-30 DIAGNOSIS — E876 Hypokalemia: Secondary | ICD-10-CM

## 2023-07-30 DIAGNOSIS — I480 Paroxysmal atrial fibrillation: Secondary | ICD-10-CM | POA: Diagnosis present

## 2023-07-30 DIAGNOSIS — M810 Age-related osteoporosis without current pathological fracture: Secondary | ICD-10-CM | POA: Diagnosis present

## 2023-07-30 DIAGNOSIS — E890 Postprocedural hypothyroidism: Secondary | ICD-10-CM | POA: Diagnosis present

## 2023-07-30 DIAGNOSIS — J4489 Other specified chronic obstructive pulmonary disease: Secondary | ICD-10-CM | POA: Diagnosis present

## 2023-07-30 DIAGNOSIS — I5032 Chronic diastolic (congestive) heart failure: Secondary | ICD-10-CM | POA: Diagnosis present

## 2023-07-30 DIAGNOSIS — I2510 Atherosclerotic heart disease of native coronary artery without angina pectoris: Secondary | ICD-10-CM | POA: Diagnosis present

## 2023-07-30 DIAGNOSIS — I130 Hypertensive heart and chronic kidney disease with heart failure and stage 1 through stage 4 chronic kidney disease, or unspecified chronic kidney disease: Secondary | ICD-10-CM | POA: Diagnosis present

## 2023-07-30 DIAGNOSIS — L089 Local infection of the skin and subcutaneous tissue, unspecified: Secondary | ICD-10-CM | POA: Diagnosis not present

## 2023-07-30 DIAGNOSIS — D631 Anemia in chronic kidney disease: Secondary | ICD-10-CM | POA: Diagnosis present

## 2023-07-30 DIAGNOSIS — E785 Hyperlipidemia, unspecified: Secondary | ICD-10-CM | POA: Diagnosis present

## 2023-07-30 DIAGNOSIS — E1122 Type 2 diabetes mellitus with diabetic chronic kidney disease: Secondary | ICD-10-CM | POA: Diagnosis present

## 2023-07-30 DIAGNOSIS — I2489 Other forms of acute ischemic heart disease: Secondary | ICD-10-CM | POA: Diagnosis present

## 2023-07-30 DIAGNOSIS — T502X5A Adverse effect of carbonic-anhydrase inhibitors, benzothiadiazides and other diuretics, initial encounter: Secondary | ICD-10-CM | POA: Diagnosis present

## 2023-07-30 DIAGNOSIS — S8011XA Contusion of right lower leg, initial encounter: Secondary | ICD-10-CM | POA: Diagnosis present

## 2023-07-30 DIAGNOSIS — L03115 Cellulitis of right lower limb: Secondary | ICD-10-CM | POA: Diagnosis present

## 2023-07-30 DIAGNOSIS — E669 Obesity, unspecified: Secondary | ICD-10-CM | POA: Diagnosis present

## 2023-07-30 DIAGNOSIS — Z6830 Body mass index (BMI) 30.0-30.9, adult: Secondary | ICD-10-CM | POA: Diagnosis not present

## 2023-07-30 DIAGNOSIS — I4891 Unspecified atrial fibrillation: Secondary | ICD-10-CM | POA: Diagnosis present

## 2023-07-30 DIAGNOSIS — R7989 Other specified abnormal findings of blood chemistry: Secondary | ICD-10-CM

## 2023-07-30 DIAGNOSIS — S8011XD Contusion of right lower leg, subsequent encounter: Secondary | ICD-10-CM | POA: Diagnosis not present

## 2023-07-30 DIAGNOSIS — W19XXXA Unspecified fall, initial encounter: Secondary | ICD-10-CM | POA: Diagnosis present

## 2023-07-30 DIAGNOSIS — K7581 Nonalcoholic steatohepatitis (NASH): Secondary | ICD-10-CM | POA: Diagnosis present

## 2023-07-30 DIAGNOSIS — K219 Gastro-esophageal reflux disease without esophagitis: Secondary | ICD-10-CM | POA: Diagnosis present

## 2023-07-30 DIAGNOSIS — Z7951 Long term (current) use of inhaled steroids: Secondary | ICD-10-CM | POA: Diagnosis not present

## 2023-07-30 DIAGNOSIS — K5900 Constipation, unspecified: Secondary | ICD-10-CM | POA: Diagnosis not present

## 2023-07-30 DIAGNOSIS — N184 Chronic kidney disease, stage 4 (severe): Secondary | ICD-10-CM | POA: Diagnosis present

## 2023-07-30 DIAGNOSIS — N179 Acute kidney failure, unspecified: Secondary | ICD-10-CM | POA: Diagnosis present

## 2023-07-30 LAB — COMPREHENSIVE METABOLIC PANEL
ALT: 19 U/L (ref 0–44)
AST: 30 U/L (ref 15–41)
Albumin: 3.4 g/dL — ABNORMAL LOW (ref 3.5–5.0)
Alkaline Phosphatase: 84 U/L (ref 38–126)
Anion gap: 14 (ref 5–15)
BUN: 95 mg/dL — ABNORMAL HIGH (ref 8–23)
CO2: 21 mmol/L — ABNORMAL LOW (ref 22–32)
Calcium: 10.4 mg/dL — ABNORMAL HIGH (ref 8.9–10.3)
Chloride: 100 mmol/L (ref 98–111)
Creatinine, Ser: 4.57 mg/dL — ABNORMAL HIGH (ref 0.44–1.00)
GFR, Estimated: 9 mL/min — ABNORMAL LOW (ref >60)
Glucose, Bld: 106 mg/dL — ABNORMAL HIGH (ref 70–99)
Potassium: 2.9 mmol/L — ABNORMAL LOW (ref 3.5–5.1)
Sodium: 135 mmol/L (ref 135–145)
Total Bilirubin: 0.9 mg/dL (ref 0.3–1.2)
Total Protein: 7.8 g/dL (ref 6.5–8.1)

## 2023-07-30 LAB — BASIC METABOLIC PANEL
Anion gap: 13 (ref 5–15)
BUN: 105 mg/dL — ABNORMAL HIGH (ref 8–23)
CO2: 22 mmol/L (ref 22–32)
Calcium: 10.2 mg/dL (ref 8.9–10.3)
Chloride: 103 mmol/L (ref 98–111)
Creatinine, Ser: 4.28 mg/dL — ABNORMAL HIGH (ref 0.44–1.00)
GFR, Estimated: 10 mL/min — ABNORMAL LOW (ref >60)
Glucose, Bld: 93 mg/dL (ref 70–99)
Potassium: 3.6 mmol/L (ref 3.5–5.1)
Sodium: 138 mmol/L (ref 135–145)

## 2023-07-30 LAB — BRAIN NATRIURETIC PEPTIDE: B Natriuretic Peptide: 293.4 pg/mL — ABNORMAL HIGH (ref 0.0–100.0)

## 2023-07-30 LAB — MAGNESIUM: Magnesium: 1.7 mg/dL (ref 1.7–2.4)

## 2023-07-30 LAB — TROPONIN I (HIGH SENSITIVITY)
Troponin I (High Sensitivity): 79 ng/L — ABNORMAL HIGH (ref <18)
Troponin I (High Sensitivity): 157 ng/L (ref <18)

## 2023-07-30 MED ORDER — POTASSIUM CHLORIDE CRYS ER 10 MEQ PO TBCR
10.0000 meq | EXTENDED_RELEASE_TABLET | Freq: Every day | ORAL | Status: DC
  Administered 2023-07-30 – 2023-08-06 (×8): 10 meq via ORAL
  Filled 2023-07-30 (×8): qty 1

## 2023-07-30 MED ORDER — FUROSEMIDE 40 MG PO TABS
40.0000 mg | ORAL_TABLET | Freq: Two times a day (BID) | ORAL | Status: DC
  Administered 2023-07-30 (×2): 40 mg via ORAL
  Filled 2023-07-30 (×2): qty 1

## 2023-07-30 MED ORDER — ACETAMINOPHEN 325 MG PO TABS
650.0000 mg | ORAL_TABLET | Freq: Once | ORAL | Status: AC
  Administered 2023-07-30: 650 mg via ORAL
  Filled 2023-07-30: qty 2

## 2023-07-30 MED ORDER — IPRATROPIUM-ALBUTEROL 0.5-2.5 (3) MG/3ML IN SOLN: 3.0000 mL | Freq: Four times a day (QID) | RESPIRATORY_TRACT | Status: DC | PRN

## 2023-07-30 MED ORDER — OXYCODONE-ACETAMINOPHEN 5-325 MG PO TABS
1.0000 | ORAL_TABLET | Freq: Once | ORAL | Status: AC
  Administered 2023-07-30: 1 via ORAL
  Filled 2023-07-30: qty 1

## 2023-07-30 MED ORDER — HYDROCODONE-ACETAMINOPHEN 5-325 MG PO TABS
1.0000 | ORAL_TABLET | ORAL | Status: DC | PRN
  Administered 2023-07-30 – 2023-07-31 (×5): 2 via ORAL
  Administered 2023-07-31 – 2023-08-01 (×2): 1 via ORAL
  Filled 2023-07-30: qty 1
  Filled 2023-07-30 (×6): qty 2

## 2023-07-30 MED ORDER — POTASSIUM CHLORIDE CRYS ER 20 MEQ PO TBCR
40.0000 meq | EXTENDED_RELEASE_TABLET | Freq: Once | ORAL | Status: AC
  Administered 2023-07-30: 40 meq via ORAL
  Filled 2023-07-30: qty 2

## 2023-07-30 MED ORDER — METOPROLOL TARTRATE 25 MG PO TABS
25.0000 mg | ORAL_TABLET | Freq: Two times a day (BID) | ORAL | Status: DC
  Administered 2023-07-30 – 2023-08-06 (×14): 25 mg via ORAL
  Filled 2023-07-30 (×15): qty 1

## 2023-07-30 MED ORDER — SODIUM CHLORIDE 0.9 % IV BOLUS
500.0000 mL | Freq: Once | INTRAVENOUS | Status: AC
Start: 2023-07-30 — End: 2023-07-30
  Administered 2023-07-30: 500 mL via INTRAVENOUS

## 2023-07-30 MED ORDER — ACETAMINOPHEN 650 MG RE SUPP: 650.0000 mg | Freq: Four times a day (QID) | RECTAL | Status: DC | PRN

## 2023-07-30 MED ORDER — METOPROLOL TARTRATE 25 MG PO TABS
25.0000 mg | ORAL_TABLET | ORAL | Status: AC
Start: 2023-07-30 — End: 2023-07-30
  Administered 2023-07-30: 25 mg via ORAL
  Filled 2023-07-30: qty 1

## 2023-07-30 MED ORDER — LEVOTHYROXINE SODIUM 150 MCG PO TABS
150.0000 ug | ORAL_TABLET | Freq: Every day | ORAL | Status: DC
  Administered 2023-07-30 – 2023-08-06 (×8): 150 ug via ORAL
  Filled 2023-07-30: qty 1
  Filled 2023-07-30: qty 3
  Filled 2023-07-30: qty 1
  Filled 2023-07-30 (×2): qty 3
  Filled 2023-07-30 (×2): qty 1
  Filled 2023-07-30: qty 3
  Filled 2023-07-30: qty 1
  Filled 2023-07-30 (×2): qty 3
  Filled 2023-07-30: qty 1
  Filled 2023-07-30 (×2): qty 3
  Filled 2023-07-30 (×2): qty 1

## 2023-07-30 MED ORDER — POTASSIUM CHLORIDE 10 MEQ/100ML IV SOLN
10.0000 meq | Freq: Once | INTRAVENOUS | Status: AC
  Administered 2023-07-30: 10 meq via INTRAVENOUS
  Filled 2023-07-30: qty 100

## 2023-07-30 MED ORDER — SODIUM CHLORIDE 0.9 % IV SOLN
1.0000 g | INTRAVENOUS | Status: AC
  Administered 2023-07-30: 1 g via INTRAVENOUS
  Filled 2023-07-30: qty 10

## 2023-07-30 MED ORDER — ONDANSETRON HCL 4 MG PO TABS
4.0000 mg | ORAL_TABLET | Freq: Four times a day (QID) | ORAL | Status: DC | PRN
  Filled 2023-07-30: qty 1

## 2023-07-30 MED ORDER — ONDANSETRON HCL 4 MG/2ML IJ SOLN: 4.0000 mg | Freq: Four times a day (QID) | INTRAMUSCULAR | Status: DC | PRN

## 2023-07-30 MED ORDER — SODIUM CHLORIDE 0.9 % IV SOLN
1.0000 g | INTRAVENOUS | Status: DC
  Administered 2023-07-30 – 2023-08-05 (×7): 1 g via INTRAVENOUS
  Filled 2023-07-30 (×8): qty 10

## 2023-07-30 MED ORDER — METOPROLOL TARTRATE 5 MG/5ML IV SOLN
5.0000 mg | INTRAVENOUS | Status: DC | PRN
  Administered 2023-07-30: 5 mg via INTRAVENOUS
  Filled 2023-07-30 (×2): qty 5

## 2023-07-30 MED ORDER — ACETAMINOPHEN 325 MG PO TABS
650.0000 mg | ORAL_TABLET | Freq: Four times a day (QID) | ORAL | Status: DC | PRN
  Administered 2023-08-01: 650 mg via ORAL
  Filled 2023-07-30: qty 2

## 2023-07-30 NOTE — Assessment & Plan Note (Signed)
Converted to sinus with IV metoprolol in the ED Continue oral metoprolol Continue apixaban

## 2023-07-30 NOTE — ED Notes (Signed)
Critical Troponin 157 [name], LAB

## 2023-07-30 NOTE — Assessment & Plan Note (Signed)
Not acutely exacerbated ?Continue home inhalers with DuoNebs as needed ?

## 2023-07-30 NOTE — Assessment & Plan Note (Signed)
Received IV and oral repletion in the ED Likely secondary to diuretics Continue to monitor and replete as needed

## 2023-07-30 NOTE — Assessment & Plan Note (Signed)
Patient short of breath, BNP 293, chest x-ray showing cardiomegaly Continue GDMT Daily weights with intake and output monitoring EF 60 to 65% 04/2023

## 2023-07-30 NOTE — Assessment & Plan Note (Signed)
Troponin bump from 79->157 Suspect demand ischemia from rapid A-fib Patient denies chest pain and EKG with no ischemic changes

## 2023-07-30 NOTE — Assessment & Plan Note (Addendum)
Possible infection Will continue ceftriaxone Surgery consulted for consideration of need for hematoma evacuation

## 2023-07-30 NOTE — TOC Initial Note (Signed)
Transition of Care [facility]) - Initial/Assessment Note    Patient Details  Name: [name] MRN: [id number] Date of Birth: [birth year]  Transition of Care [facility]) CM/SW Contact:    Margarito Liner, LCSW Phone Number: 07/30/2023, 10:39 AM  Clinical Narrative: Readmission prevention screen complete. CSW met with patient. Husband at bedside. CSW introduced role and explained that discharge planning would be discussed. PCP is Einar Crow, MD. Husband or niece drive her to appointments. She uses [facility]. No issues obtaining medications. Patient lives home with her husband. She is active with [facility] for RN and PT. Liaison said they can add whatever services she needs at discharge. Patient reports having a wheelchair, RW, rollator, SPC, and shower chair. No further concerns. CSW encouraged patient and her husband to contact CSW as needed. CSW will continue to follow patient and her husband for support and facilitate return home once stable.                 Expected Discharge Plan: Home w Home Health Services Barriers to Discharge: Continued Medical Work up   Patient Goals and CMS Choice     Choice offered to / list presented to : Patient      Expected Discharge Plan and Services     Post Acute Care Choice: Resumption of Svcs/PTA Provider Living arrangements for the past 2 months: Single Family Home                           HH Arranged: RN, PT [facility] Agency: [employer] Home Health Services Date [facility] Agency Contacted: 07/30/23   Representative spoke with at [facility] Agency: Becky Sax  Prior Living Arrangements/Services Living arrangements for the past 2 months: Single Family Home Lives with:: Spouse Patient language and need for interpreter reviewed:: Yes Do you feel safe going back to the place where you live?: Yes      Need for Family Participation in Patient Care: Yes (Comment) Care giver support system in place?: Yes (comment) Current home services: DME,  Home PT, Home RN Criminal Activity/Legal Involvement Pertinent to Current Situation/Hospitalization: No - Comment as needed  Activities of Daily Living   ADL Screening (condition at time of admission) Independently performs ADLs?: No Does the patient have a NEW difficulty with bathing/dressing/toileting/self-feeding that is expected to last >3 days?: No Does the patient have a NEW difficulty with getting in/out of bed, walking, or climbing stairs that is expected to last >3 days?: No Does the patient have a NEW difficulty with communication that is expected to last >3 days?: No Is the patient deaf or have difficulty hearing?: No Does the patient have difficulty seeing, even when wearing glasses/contacts?: No Does the patient have difficulty concentrating, remembering, or making decisions?: No  Permission Sought/Granted Permission sought to share information with : Facility Medical sales representative, Family Supports Permission granted to share information with : Yes, Verbal Permission Granted  Share Information with NAME: Brooklen Thomann, Colletta [place]  Permission granted to share info w AGENCY: [facility]  Permission granted to share info w Relationship: Fayrene Fearing: Husband, Danielle: Niece  Permission granted to share info w Contact Information: Fayrene Fearing: [phone]: [phone]  Emotional Assessment Appearance:: Appears stated age Attitude/Demeanor/Rapport: Engaged, Gracious Affect (typically observed): Accepting, Appropriate, Calm, Pleasant Orientation: : Oriented to Self, Oriented to Place, Oriented to  Time, Oriented to Situation Alcohol / Substance Use: Not Applicable Psych Involvement: No (comment)  Admission diagnosis:  Atrial fibrillation with  rapid ventricular response (HCC) [I48.91] Patient Active Problem List   Diagnosis Date Noted   Atrial fibrillation with rapid ventricular response (HCC) 07/30/2023   Traumatic hematoma of lower leg with infection, right,  initial encounter 07/30/2023   Hypokalemia 07/30/2023   Elevated troponin 07/30/2023   PAF (paroxysmal atrial fibrillation) (HCC) 06/03/2023   Hypercalcemia 04/16/2023   Hypermagnesemia 04/16/2023   HLD (hyperlipidemia) 04/16/2023   COPD (chronic obstructive pulmonary disease) (HCC) 04/16/2023   Anemia in chronic kidney disease 03/27/2023   Electrolyte abnormality 03/24/2023   CKD (chronic kidney disease) stage 5, 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   Hyperparathyroidism, unspecified (HCC) 08/05/2022   Metabolic acidosis 08/02/2022   Uremia of renal origin 08/02/2022   Uremia 08/02/2022   Acute metabolic encephalopathy 08/02/2022   Acute on chronic diastolic congestive heart failure (HCC) 08/02/2022   Chronic bronchitis (HCC) 08/02/2022   Anemia of chronic kidney failure, stage 5 (HCC) 08/02/2022   Dizziness s/p fall 08/02/2022   AKI (acute kidney injury) (HCC) 06/12/2022   Hypomagnesemia 06/12/2022   Hyperphosphatemia 06/12/2022   Pneumonia due to COVID-19 virus 06/10/2022   Hypocalcemia, symptomatic 06/10/2022   Dyslipidemia 06/10/2022   Depression 06/10/2022   Right shoulder pain 06/10/2022   Generalized weakness    Nausea vomiting and diarrhea    Chronic obstructive pulmonary disease, unspecified (HCC) 10/02/2021   Gouty arthropathy, chronic, 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   Hypothyroidism 09/01/2019   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/25/2019   Multinodular goiter 07/31/2019   Class 2 severe obesity with serious comorbidity in adult (HCC) 12/12/2018   Type 2 diabetes mellitus with obesity (HCC) 09/04/2017   Bilateral lower extremity edema 09/04/2017   Gastroesophageal reflux disease without esophagitis 05/16/2017   Vomiting and diarrhea 12/29/2016   Enteritis 12/29/2016   Acute lower UTI 12/29/2016   Incomplete emptying of bladder 11/01/2016   Urge incontinence 07/14/2015   Acute renal failure superimposed on stage 4 chronic kidney disease (HCC) 08/06/2014   Renal mass,  right 08/06/2014   Precordial pain 07/24/2014   Dyspnea 07/24/2014   Essential hypertension 07/24/2014   Hyperlipidemia 07/24/2014   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 - [place], [place] - [address]er [place] [address] Phone: [phone] Fax: [phone]     Social Determinants of Health (SDOH) Social History: SDOH Screenings   Food Insecurity: No Food Insecurity (07/30/2023)  Housing: Low Risk  (07/30/2023)  Transportation Needs: No Transportation Needs (07/30/2023)  Utilities: Not At Risk (07/30/2023)  Financial Resource Strain: Low Risk  (06/12/2023)  Physical Activity: Insufficiently Active (06/12/2023)  Social Connections: Moderately Isolated (06/12/2023)  Stress: No Stress Concern Present (06/12/2023)  Tobacco Use: Low Risk  (07/30/2023)   SDOH Interventions:     Readmission Risk Interventions    07/30/2023   10:37 AM 04/19/2023    1:10 PM 03/27/2023   12:56 PM  Readmission Risk Prevention Plan  Transportation Screening Complete Complete Complete  Medication Review Oceanographer) Complete Complete Complete  PCP or Specialist appointment within 3-5 days of discharge Complete  Complete  HRI or Home Care Consult Complete Complete Complete  SW Recovery Care/Counseling Consult Complete Complete Complete  Palliative Care Screening Not Applicable Not Applicable Not Applicable  Skilled Nursing Facility Not Applicable Not Applicable Not Applicable

## 2023-07-30 NOTE — Progress Notes (Signed)
[facility]  ROUNDING NOTE   Subjective:   [name] IS A 82 y.o. female with past medical conditions including diastolic heart failure, hypertension, hypothyroidism, COPD, and chronic kidney disease stage IV. Patient reports to ED with RLE pain. She has been admitted for Atrial fibrillation with rapid ventricular response [facility]) [I48.91]  Patient is known to our practice and is followed by Dr Cherylann Ratel. He was last seen in office on 07/19/23. Patient reports increased pain in right. States she was getting in her truck and slipped off running board. She was found to be in a fib in ED.  . Labs on ED arrival include potassium 2.9, BUN 95, Creatinine 4.57, BNP 293, troponin 79, increased to 157. Chest x ray shows no acute findings. Right LE xray shows soft tissue swelling.   We have been consulted to manage acute kidney injury.  Objective:  Vital signs in last 24 hours:  Temp:  [98 F (36.7 C)-98.1 F (36.7 C)] 98.1 F (36.7 C) (10/28 0743) Pulse Rate:  [76-126] 87 (10/28 0904) Resp:  [16-25] 16 (10/28 0904) BP: (106-154)/(38-87) 106/64 (10/28 0904) SpO2:  [94 %-98 %] 97 % (10/28 0904) Weight:  [70.7 kg] 70.7 kg (10/27 2319)  Weight change:  Filed Weights   07/29/23 2319  Weight: 70.7 kg    Intake/Output: No intake/output data recorded.   Intake/Output this shift:  No intake/output data recorded.  Physical Exam: General: NAD  Head: Normocephalic, atraumatic. Moist oral mucosal membranes  Eyes: Anicteric  Neck: Supple, trachea midline  Lungs:  Clear to auscultation, room air  Heart: Regular rate and rhythm  Abdomen:  Soft, nontender  Extremities:  1+ peripheral edema.RLE bruising  Neurologic: Nonfocal, moving all four extremities  Skin: RLE abrasions       Basic Metabolic Panel: Recent Labs  Lab 07/29/23 2324  NA 135  K 2.9*  CL 100  CO2 21*  GLUCOSE 106*  BUN 95*  CREATININE 4.57*  CALCIUM 10.4*  MG 1.7    Liver Function Tests: Recent Labs   Lab 07/29/23 2324  AST 30  ALT 19  ALKPHOS 84  BILITOT 0.9  PROT 7.8  ALBUMIN 3.4*   No results for input(s): "LIPASE", "AMYLASE" in the last 168 hours. No results for input(s): "AMMONIA" in the last 168 hours.  CBC: Recent Labs  Lab 07/29/23 2324  WBC 8.9  NEUTROABS 6.2  HGB 8.5*  HCT 26.1*  MCV 84.7  PLT 204    Cardiac Enzymes: No results for input(s): "CKTOTAL", "CKMB", "CKMBINDEX", "TROPONINI" in the last 168 hours.  BNP: Invalid input(s): "POCBNP"  CBG: No results for input(s): "GLUCAP" in the last 168 hours.  Microbiology: Results for orders placed or performed during the hospital encounter of 04/16/23  SARS Coronavirus 2 by RT PCR (hospital order, performed in [facility] hospital lab) *cepheid single result test* Anterior Nasal Swab     Status: None   Collection Time: 04/16/23  2:10 PM   Specimen: Anterior Nasal Swab  Result Value Ref Range Status   SARS Coronavirus 2 by RT PCR NEGATIVE NEGATIVE Final    Comment: (NOTE) SARS-CoV-2 target nucleic acids are NOT DETECTED.  The SARS-CoV-2 RNA is generally detectable in upper and lower respiratory specimens during the acute phase of infection. The lowest concentration of SARS-CoV-2 viral copies this assay can detect is 250 copies / mL. A negative result does not preclude SARS-CoV-2 infection and should not be used as the sole basis for treatment or other patient management  decisions.  A negative result may occur with improper specimen collection / handling, submission of specimen other than nasopharyngeal swab, presence of viral mutation(s) within the areas targeted by this assay, and inadequate number of viral copies (<250 copies / mL). A negative result must be combined with clinical observations, patient history, and epidemiological information.  Fact Sheet for Patients:   RoadLapTop.co.za  Fact Sheet for Healthcare Providers: http://kim-miller.com/  This  test is not yet approved or  cleared by the [place] FDA and has been authorized for detection and/or diagnosis of SARS-CoV-2 by FDA under an Emergency Use Authorization (EUA).  This EUA will remain in effect (meaning this test can be used) for the duration of the COVID-19 declaration under Section 564(b)(1) of the Act, 21 U.S.C. section 360bbb-3(b)(1), unless the authorization is terminated or revoked sooner.  Performed at [facility], [address] [place], [place] [address]   Respiratory (~20 pathogens) panel by PCR     Status: None   Collection Time: 04/18/23  1:27 PM   Specimen: Nasopharyngeal Swab; Respiratory  Result Value Ref Range Status   Adenovirus NOT DETECTED NOT DETECTED Final   Coronavirus 229E NOT DETECTED NOT DETECTED Final    Comment: (NOTE) The Coronavirus on the Respiratory Panel, DOES NOT test for the novel  Coronavirus (2019 nCoV)    Coronavirus HKU1 NOT DETECTED NOT DETECTED Final   Coronavirus NL63 NOT DETECTED NOT DETECTED Final   Coronavirus OC43 NOT DETECTED NOT DETECTED Final   Metapneumovirus NOT DETECTED NOT DETECTED Final   Rhinovirus / Enterovirus NOT DETECTED NOT DETECTED Final   Influenza A NOT DETECTED NOT DETECTED Final   Influenza B NOT DETECTED NOT DETECTED Final   Parainfluenza Virus 1 NOT DETECTED NOT DETECTED Final   Parainfluenza Virus 2 NOT DETECTED NOT DETECTED Final   Parainfluenza Virus 3 NOT DETECTED NOT DETECTED Final   Parainfluenza Virus 4 NOT DETECTED NOT DETECTED Final   Respiratory Syncytial Virus NOT DETECTED NOT DETECTED Final   Bordetella pertussis NOT DETECTED NOT DETECTED Final   Bordetella Parapertussis NOT DETECTED NOT DETECTED Final   Chlamydophila pneumoniae NOT DETECTED NOT DETECTED Final   Mycoplasma pneumoniae NOT DETECTED NOT DETECTED Final    Comment: Performed at [facility], [address] [place], [place] [address]  Urine Culture (for pregnant, neutropenic or urologic patients or  patients with an indwelling urinary catheter)     Status: Abnormal   Collection Time: 04/18/23  1:28 PM   Specimen: Urine, Clean Catch  Result Value Ref Range Status   Specimen Description   Final    URINE, CLEAN CATCH Performed at [facility], [address] [place], [place] [address]    Special Requests   Final    NONE Performed at [facility], [address] [place], [place] [address]    Culture MULTIPLE SPECIES PRESENT, SUGGEST RECOLLECTION (A)  Final   Report Status 04/19/2023 FINAL  Final  Expectorated Sputum Assessment w Gram Stain, Rflx to Resp Cult     Status: None   Collection Time: 04/19/23  2:00 PM   Specimen: Sputum  Result Value Ref Range Status   Specimen Description SPUTUM  Final   Special Requests NONE  Final   Sputum evaluation   Final    THIS SPECIMEN IS ACCEPTABLE FOR SPUTUM CULTURE Performed at [facility], [address] [place], [place] [address]    Report Status 04/19/2023 FINAL  Final  Culture, Respiratory w Gram Stain  Status: None   Collection Time: 04/19/23  2:00 PM   Specimen: SPU  Result Value Ref Range Status   Specimen Description   Final    SPUTUM Performed at [facility], [address], [place], [place] [address]    Special Requests   Final    NONE Reflexed from [id number] Performed at [facility], [address] [place], [place] [address]    Gram Stain   Final    FEW SQUAMOUS EPITHELIAL CELLS PRESENT FEW WBC PRESENT, PREDOMINANTLY MONONUCLEAR MODERATE GRAM POSITIVE COCCI IN PAIRS AND CHAINS MODERATE GRAM NEGATIVE RODS FEW YEAST    Culture   Final    MODERATE Normal respiratory flora-no Staph aureus or Pseudomonas seen Performed at [facility], [address], [place], [place] [address]    Report Status 04/23/2023 FINAL  Final    Coagulation Studies: Recent Labs    07/29/23 2324  LABPROT 18.8*  INR 1.6*    Urinalysis: No results for input(s): "COLORURINE",  "LABSPEC", "PHURINE", "GLUCOSEU", "HGBUR", "BILIRUBINUR", "KETONESUR", "PROTEINUR", "UROBILINOGEN", "NITRITE", "LEUKOCYTESUR" in the last 72 hours.  Invalid input(s): "APPERANCEUR"    Imaging: DG Tibia/Fibula Right  Result Date: 07/30/2023 CLINICAL DATA:  Pain and swelling EXAM: RIGHT TIBIA AND FIBULA - 2 VIEW COMPARISON:  None Available. FINDINGS: No fracture or malalignment. Vascular calcifications. Protuberant soft tissue swelling over the mid to distal anterior lower leg. IMPRESSION: No acute osseous abnormality. Protuberant soft tissue swelling over the mid to distal anterior lower leg. Electronically Signed   By: Jasmine Pang M.D.   On: 07/30/2023 01:20   DG Chest Portable 1 View  Result Date: 07/30/2023 CLINICAL DATA:  Dyspnea EXAM: PORTABLE CHEST 1 VIEW COMPARISON:  06/01/2023 FINDINGS: Cardiomegaly. No acute airspace disease, pleural effusion, or pneumothorax. IMPRESSION: No active disease. Cardiomegaly. Electronically Signed   By: Jasmine Pang M.D.   On: 07/30/2023 00:29     Medications:    cefTRIAXone (ROCEPHIN)  IV      furosemide  40 mg Oral BID   levothyroxine  150 mcg Oral Daily   metoprolol tartrate  25 mg Oral BID   potassium chloride  10 mEq Oral Daily   acetaminophen **OR** acetaminophen, HYDROcodone-acetaminophen, ipratropium-albuterol, ondansetron **OR** ondansetron (ZOFRAN) IV  Assessment/ Plan:  [name] is a 82 y.o.  female with past medical conditions including diastolic heart failure, hypertension, hypothyroidism, COPD, and chronic kidney disease stage IV Patient reports to ED with RLE pain. She has been admitted for Atrial fibrillation with rapid ventricular response (HCC) [I48.91]   Acute Kidney Injury on chronic kidney disease stage IV with baseline creatinine 2.33 and GFR of 20 on 06/03/23.  Acute kidney injury secondary to cardiorenal  syndrome and concurrent illness. Creatinine elevated to 4.57 on admission. Remains on furosemide 40mg  oral  twice daily, fine with this for now. No acute indication for dialysis. Will continue to monitor with treatment of underlying cause.   Lab Results  Component Value Date   CREATININE 4.57 (H) 07/29/2023   CREATININE 2.33 (H) 06/04/2023   CREATININE 2.33 (H) 06/03/2023   No intake or output data in the 24 hours ending 07/30/23 1124  2. Anemia of chronic kidney disease Lab Results  Component Value Date   HGB 8.5 (L) 07/29/2023    Hgb just below desired target. Will monitor for now.   3. Hypokalemia, potassium 2.9. Oral and IV supplementation ordered by primary.   4. Secondary Hyperparathyroidism: with outpatient labs: PTH 7, phosphorus 4.7, calcium 12.4  on 07/19/23.   Lab Results  Component Value Date   CALCIUM 10.4 (H) 07/29/2023   PHOS 3.6 06/04/2023    Bone minerals acceptable. Will continue to monitor during this admission.    LOS: 0 Breylon Sherrow 10/28/202411:24 AM

## 2023-07-30 NOTE — Assessment & Plan Note (Addendum)
Anemia of CKD Creatinine 4.57 up from baseline of 2.33, suspect related to diuretics continue to monitor and avoid nephrotoxins Nephrology consulted

## 2023-07-30 NOTE — ED Provider Notes (Signed)
[facility] Provider Note    Event Date/Time   First MD Initiated Contact with Patient 07/29/23 2317     (approximate)   History   Shortness of Breath   HPI [name] is a 82 y.o. female with extensive chronic medical issues that include but are not limited to paroxysmal atrial fibrillation on chronic anticoagulation with Eliquis, CHF, COPD, chronic kidney disease, chronic anemia, etc.  She presents tonight by EMS for evaluation of rapid heart rate/palpitations and some shortness of breath.  She said that tonight she felt like her heart was racing and she has been a little bit short of breath recently more than usual so she used her albuterol inhaler about 30 minutes before she got to the ED.  She says she does not feel short of breath currently but she can still feel her heart racing.  She denies having any chest pain.  She has been a little bit lightheaded at times but has not passed out.  Of note she states that about 8 days ago she struck the right lower leg on the outside of her truck and still has a large bruise or blood clot on the side of her leg that she has kept wrapped.  She has not had any fever and is able to ambulate but it is very painful for her.     Physical Exam   Triage Vital Signs: ED Triage Vitals  Encounter Vitals Group     BP 07/29/23 2320 122/62     Systolic BP Percentile --      Diastolic BP Percentile --      Pulse Rate 07/29/23 2320 (!) 126     Resp 07/29/23 2320 16     Temp 07/29/23 2320 98 F (36.7 C)     Temp src --      SpO2 07/29/23 2320 98 %     Weight 07/29/23 2319 70.7 kg (155 lb 12.8 oz)     Height 07/29/23 2319 1.575 m (5\' 2" )     Head Circumference --      Peak Flow --      Pain Score 07/29/23 2319 0     Pain Loc --      Pain Education --      Exclude from Growth Chart --     Most recent vital signs: Vitals:   07/30/23 0139 07/30/23 0200  BP:    Pulse: 92 91  Resp:  (!) 22  Temp:    SpO2:  95%     General: Awake, appears chronically ill but not in acute distress.  Alert and oriented. CV:  Good peripheral perfusion.  Irregularly irregular rhythm with tachycardia anywhere from 10 5-1 45 observed during my assessment. Resp:  Normal effort. Speaking easily and comfortably, no accessory muscle usage nor intercostal retractions.  Lungs are clear to auscultation. Abd:  No distention.  No tenderness to palpation of the abdomen. Other:  Patient has a large subacute hematoma and surrounding ecchymosis extending all the way down from her mid right lower extremity to her foot.  There is surrounding erythema, difficult to appreciate venous stasis versus infection.  Tender to palpation.  Consistent with traumatic hematoma rather than DVT or intravascular issue.  See photo below.     ED Results / Procedures / Treatments   Labs (all labs ordered are listed, but only abnormal results are displayed) Labs Reviewed  PROTIME-INR - Abnormal; Notable for the following components:  Result Value   Prothrombin Time 18.8 (*)    INR 1.6 (*)    All other components within normal limits  COMPREHENSIVE METABOLIC PANEL - Abnormal; Notable for the following components:   Potassium 2.9 (*)    CO2 21 (*)    Glucose, Bld 106 (*)    BUN 95 (*)    Creatinine, Ser 4.57 (*)    Calcium 10.4 (*)    Albumin 3.4 (*)    GFR, Estimated 9 (*)    All other components within normal limits  CBC WITH DIFFERENTIAL/PLATELET - Abnormal; Notable for the following components:   RBC 3.08 (*)    Hemoglobin 8.5 (*)    HCT 26.1 (*)    RDW 16.5 (*)    All other components within normal limits  BRAIN NATRIURETIC PEPTIDE - Abnormal; Notable for the following components:   B Natriuretic Peptide 293.4 (*)    All other components within normal limits  TROPONIN I (HIGH SENSITIVITY) - Abnormal; Notable for the following components:   Troponin I (High Sensitivity) 79 (*)    All other components within normal limits  TROPONIN I  (HIGH SENSITIVITY) - Abnormal; Notable for the following components:   Troponin I (High Sensitivity) 157 (*)    All other components within normal limits  MAGNESIUM     EKG  ED ECG REPORT I, Loleta Rose, the attending physician, personally viewed and interpreted this ECG.  Date: 07/29/2023 EKG Time: 23: 20 Rate: 140 Rhythm: Atrial fibrillation with RVR QRS Axis: normal Intervals: normal ST/T Wave abnormalities: Non-specific ST segment / T-wave changes, but no clear evidence of acute ischemia. Narrative Interpretation: no definitive evidence of acute ischemia; does not meet STEMI criteria.  ED ECG REPORT #2 I, Loleta Rose, the attending physician, personally viewed and interpreted this ECG.  Date: 07/30/2023 EKG Time: 01:40 Rate: 92 Rhythm: normal sinus rhythm QRS Axis: normal Intervals: normal, LVH ST/T Wave abnormalities: Non-specific ST segment / T-wave changes, but no clear evidence of acute ischemia. Narrative Interpretation: no definitive evidence of acute ischemia; does not meet STEMI criteria.   RADIOLOGY I viewed and interpreted the patient's 1 view chest x-ray and I see no evidence of pulmonary edema or volume overload or pneumonia.  I also read the radiologist's report, which confirmed no acute findings.  I viewed and interpreted the patient's right lower leg x-rays.  Soft tissue swelling, no bony changes.  Radiologist report confirms   PROCEDURES:  Critical Care performed: Yes, see critical care procedure note(s)  .1-3 Lead EKG Interpretation  Performed by: Loleta Rose, MD Authorized by: Loleta Rose, MD     Interpretation: abnormal     ECG rate:  125   ECG rate assessment: tachycardic     Rhythm: atrial fibrillation     Ectopy: none     Conduction: normal   .Critical Care  Performed by: Loleta Rose, MD Authorized by: Loleta Rose, MD   Critical care provider statement:    Critical care time (minutes):  45   Critical care time was  exclusive of:  Separately billable procedures and treating other patients   Critical care was necessary to treat or prevent imminent or life-threatening deterioration of the following conditions:  Circulatory failure, cardiac failure and renal failure   Critical care was time spent personally by me on the following activities:  Development of treatment plan with patient or surrogate, evaluation of patient's response to treatment, examination of patient, obtaining history from patient or surrogate, ordering and performing treatments  and interventions, ordering and review of laboratory studies, ordering and review of radiographic studies, pulse oximetry, re-evaluation of patient's condition and review of old charts     IMPRESSION / MDM / ASSESSMENT AND PLAN / ED COURSE  I reviewed the triage vital signs and the nursing notes.                              Differential diagnosis includes, but is not limited to, A-fib with RVR, volume depletion, volume overload, worsening anemia, electrolyte or metabolic abnormality, renal dysfunction.  Patient's presentation is most consistent with acute presentation with potential threat to life or bodily function.  Labs/studies ordered: EKG, x-rays of chest and right tibia/fibula, CBC with differential, CMP, BNP, INR, high-sensitivity troponin, magnesium  Interventions/Medications given:  Medications  cefTRIAXone (ROCEPHIN) 1 g in sodium chloride 0.9 % 100 mL IVPB (has no administration in time range)  sodium chloride 0.9 % bolus 500 mL (500 mLs Intravenous Bolus 07/30/23 0050)  potassium chloride 10 mEq in 100 mL IVPB (10 mEq Intravenous New Bag/Given 07/30/23 0052)  potassium chloride SA (KLOR-CON M) CR tablet 40 mEq (40 mEq Oral Given 07/30/23 0046)  oxyCODONE-acetaminophen (PERCOCET/ROXICET) 5-325 MG per tablet 1 tablet (1 tablet Oral Given 07/30/23 0046)  acetaminophen (TYLENOL) tablet 650 mg (650 mg Oral Given 07/30/23 0046)  metoprolol tartrate  (LOPRESSOR) tablet 25 mg (25 mg Oral Given 07/30/23 0139)    (Note:  hospital course my include additional interventions and/or labs/studies not listed above.)   Extensive chronic medical issues with several acute problems.  Labs are most notable for hypokalemia with a potassium of 2.9 which I am repleted with 40 mill equivalents by mouth and 10 mill equivalents by IV.  Her creatinine has jumped from a baseline of approximately 2.3 to nearly 4.6 which is of significant concern and suggest overdiuresis.  Troponin elevated at 79 suggestive of demand ischemia but highly doubt ACS.  Patient is anticoagulated on Eliquis and I will not start heparin at this time.  CBC is notable for a stable though slightly lower than baseline hemoglobin of 8.5, likely anemia of chronic disease in the setting of her kidney dysfunction and she already sees a nephrologist.  No leukocytosis.  I am concerned about the possibility of her right lower leg having an infected hematoma and I have ordered ceftriaxone 1 g IV to begin treatment of possible cellulitis/infected hematoma.  Given her Eliquis status I do not think that emergent intervention such as I&D is appropriate at this time but she may benefit from an orthopedics consult while in the hospital.  Ordering metoprolol 5 mg IV x 3 doses as needed every 5 minutes for rate control.  I ordered a 500 mL normal saline bolus to begin rehydration.  1 Percocet and 650 of Tylenol for the pain in her leg.  Patient will need to be admitted.  The patient is on the cardiac monitor to evaluate for evidence of arrhythmia and/or significant heart rate changes.   Clinical Course as of 07/30/23 0216  Mon Jul 30, 2023  0143 Patient's rate better controlled after 1 dose of IV metoprolol and metoprolol 25 mg p.o. has been given to try to maintain rate control.  She is getting the rest of her medications as previously ordered.  Consulted hospitalist at 1:30 AM for admission. [CF]  0150 Repeat  EKG shows normal sinus rhythm with a heart rate in the 90s [CF]  0213 Consulted  Dr. Para March from the hospitalist service.  We discussed the case in detail and she will admit the patient. [CF]    Clinical Course User Index [CF] Loleta Rose, MD     FINAL CLINICAL IMPRESSION(S) / ED DIAGNOSES   Final diagnoses:  Atrial fibrillation with RVR (HCC)  Acute renal failure superimposed on chronic kidney disease, unspecified acute renal failure type, unspecified CKD stage (HCC)  Chronic anemia  Current use of long term anticoagulation  Demand ischemia (HCC)  Hypokalemia  Traumatic hematoma of lower leg with infection, right, subsequent encounter  Elevated troponin level     Rx / DC Orders   ED Discharge Orders     None        Note:  This document was prepared using Dragon voice recognition software and may include unintentional dictation errors.   Loleta Rose, MD 07/30/23 [phone]

## 2023-07-30 NOTE — Assessment & Plan Note (Signed)
CPAP nightly

## 2023-07-30 NOTE — Progress Notes (Signed)
Progress Note   Patient: [name] Nikkel NWG:[id number] DOB: 10/26/1940 DOA: 07/29/2023     0 DOS: the patient was seen and examined on 07/30/2023    This is a nonbillable encounter  Patient seen and examined at bedside this morning Admitted overnight with complaints of shortness of breath and palpitation. Presented to the emergency room found to have A-fib with RVR as well as worsening renal function.  Noted to also have ecchymosis involving the right leg as well as possible surrounding infection.  General surgeon consulted as well as nephrologist by admitting physician. I have discussed the case with nephrologist who will continue to follow-up with patient I also discussed the case with general surgery team and at this time the leg does need any immediate surgical intervention and they will continue to be on standby if patient needs any surgical intervention. Details of H&P as documented in Dr. Jerrye Beavers Duncan's notes   Vitals:   07/30/23 1100 07/30/23 1200 07/30/23 1500 07/30/23 1617  BP:  (!) 146/55 (!) 136/48 (!) 146/57  Pulse:  94 88 70  Resp:  18 20 16   Temp: 98.7 F (37.1 C) 98.9 F (37.2 C) 98.9 F (37.2 C) 98 F (36.7 C)  TempSrc:  Oral Oral Oral  SpO2:  98% 100% 97%  Weight:      Height:         Author: Loyce Dys, MD 07/30/2023 4:41 PM  For on call review [email or web address].

## 2023-07-30 NOTE — H&P (Signed)
History and Physical    Patient: [name] WUJ:[id number] DOB: [birth year] DOA: 07/29/2023 DOS: the patient was seen and examined on 07/30/2023 PCP: Lauro Regulus, MD  Patient coming from: Home  Chief Complaint:  Chief Complaint  Patient presents with   Shortness of Breath    HPI: [name] is a 82 y.o. female with medical history significant for CKD 4, anemia of CKD, diastolic CHF, HTN, surgical hypothyroidism, gout COPD, OSA on CPAP, diagnosed with A-fib a month ago while hospitalized (8/30 to 9/2) for CHF exacerbation, who is being admitted for A-fib with RVR and CHF exacerbation after presenting with palpitations and shortness of breath that started earlier in the day.  Heart rate was in the 160s with EMS and she was administered albuterol prior to arrival.  Patient denies chest pain, cough, fever or chills.  Of Note, patient was seen in the ED a week prior with a contusion with hematoma of the left lower extremity that she sustained after she hit her leg against the running board of her truck.  She was treated with compressive dressing and discharged.  She denies increased pain or swelling to the area and denies fevers but still has pain with ambulation. ED course and data review: Tachycardic to 126, tachypneic to the mid 20s with otherwise normal vitals. Labs: Troponin 79-157 and BNP 293 WBC 8900, hemoglobin at baseline at 8.5 Creatinine 4.57 up from baseline of 2.33, with bicarb 21 normal anion gap Potassium 2.9 EKG #1, personally viewed and interpreted: A-fib at 140 EKG #2 (s/p IV metoprolol), personally viewed and interpreted with sinus rhythm at 92 Chest x-ray no active disease.  Cardiomegaly  Patient treated with IV metoprolol followed by oral with conversion to NSR.  Also given IV and oral potassium repletion, and given a dose of ceftriaxone for possible infection of left leg hematoma. Hospitalist consulted for admission   Review of Systems: As mentioned in the  history of present illness. All other systems reviewed and are negative.  Past Medical History:  Diagnosis Date   Actinic keratosis    Albuminuria    Anemia    Arthritis    Asthma    Basal cell carcinoma 04/12/2017   Above right lateral brow. Nodulocystic type. EDC   Cancer (HCC)    skin   Cataract cortical, senile    CHF (congestive heart failure) (HCC)    Diabetes mellitus without complication (HCC)    GERD (gastroesophageal reflux disease)    Hemorrhoids    History of kidney stones    Hyperlipidemia    Hypertension    Hypothyroidism    Lyme disease    No kidney function    OSA (obstructive sleep apnea)    Osteoporosis    Osteoporosis    Reflux esophagitis    Steatohepatitis    Steatohepatitis    Past Surgical History:  Procedure Laterality Date   ABDOMINAL HYSTERECTOMY     APPENDECTOMY     AV FISTULA PLACEMENT Left 05/19/2022   Procedure: ARTERIOVENOUS (AV) FISTULA CREATION ( BRACHIAL CEPHALIC );  Surgeon: Renford Dills, MD;  Location: [facility] ORS;  Service: Vascular;  Laterality: Left;   CARDIAC CATHETERIZATION  1980   [facility]   CARDIAC CATHETERIZATION  08/13/2014   [facility]. no significant CAD, normal LVEDP.    CATARACT EXTRACTION     CHOLECYSTECTOMY     COLONOSCOPY     COLONOSCOPY WITH PROPOFOL N/A 12/07/2016   Procedure: COLONOSCOPY WITH PROPOFOL;  Surgeon: Christena Deem, MD;  Location: [facility] ENDOSCOPY;  Service: Endoscopy;  Laterality: N/A;   ESOPHAGOGASTRODUODENOSCOPY     ESOPHAGOGASTRODUODENOSCOPY (EGD) WITH PROPOFOL N/A 12/07/2016   Procedure: ESOPHAGOGASTRODUODENOSCOPY (EGD) WITH PROPOFOL;  Surgeon: Christena Deem, MD;  Location: [facility] ENDOSCOPY;  Service: Endoscopy;  Laterality: N/A;   ESOPHAGOGASTRODUODENOSCOPY (EGD) WITH PROPOFOL N/A 01/07/2018   Procedure: ESOPHAGOGASTRODUODENOSCOPY (EGD) WITH PROPOFOL;  Surgeon: Christena Deem, MD;  Location: [facility] ENDOSCOPY;  Service: Endoscopy;  Laterality: N/A;   EYE SURGERY     HEMORRHOID SURGERY     PARTIAL  HYSTERECTOMY     THYROIDECTOMY N/A 08/25/2019   Procedure: THYROIDECTOMY EXTRACTION OF SUBTOTAL COMPONENT; PARATHYROID AUTOTRANSPLANT X1;  Surgeon: Duanne Guess, MD;  Location: [facility] ORS;  Service: General;  Laterality: N/A;  With Nerve Monitoring(RLN)   TONSILLECTOMY     Social History:  reports that she has never smoked. She has never used smokeless tobacco. She reports that she does not drink alcohol and does not use drugs.  Allergies  Allergen Reactions   Ace Inhibitors     Other reaction(s): Unknown   Egg-Derived Products Diarrhea   Other     Other reaction(s): Other (See Comments) Eggs   Prednisone     Other reaction(s): Other (See Comments) joint pain   Risedronate     Other reaction(s): Other (See Comments)   Sulfa Antibiotics Itching and Swelling    Other reaction(s): Other (See Comments)   Sulfasalazine Other (See Comments)    Family History  Problem Relation Age of Onset   Breast cancer Mother    Heart attack Father     Prior to Admission medications   Medication Sig Start Date End Date Taking? Authorizing Provider  acetaminophen (TYLENOL) 500 MG tablet Take 1,000 mg by mouth every 6 (six) hours as needed for mild pain or moderate pain.    [provider]  albuterol (PROVENTIL HFA;VENTOLIN HFA) 108 (90 BASE) MCG/ACT inhaler Inhale 2 puffs into the lungs every 6 (six) hours as needed for wheezing or shortness of breath.    [provider]  allopurinol (ZYLOPRIM) 100 MG tablet Take 1 tablet (100 mg total) by mouth daily. 06/05/23 06/04/24  Gillis Santa, MD  amLODipine (NORVASC) 5 MG tablet Take 1 tablet (5 mg total) by mouth 2 (two) times daily. 06/06/23 06/05/24  Charlsie Quest, NP  apixaban (ELIQUIS) 2.5 MG TABS tablet Take 1 tablet (2.5 mg total) by mouth 2 (two) times daily. 06/04/23 06/03/24  Gillis Santa, MD  azelastine (ASTELIN) 0.1 % nasal spray Place 1 spray into both nostrils 2 (two) times daily.    [provider]  colchicine 0.6 MG tablet  Take 0.5 tablets (0.3 mg total) by mouth daily for 8 days. 06/05/23 06/13/23  Gillis Santa, MD  cyanocobalamin (VITAMIN B12) 500 MCG tablet Take 1 tablet (500 mcg total) by mouth daily. 06/05/23 09/03/23  Gillis Santa, MD  esomeprazole (NEXIUM) 40 MG capsule Take 40 mg by mouth daily before breakfast.    [provider]  fluticasone (FLONASE) 50 MCG/ACT nasal spray Place 2 sprays into both nostrils daily.    [provider]  fluticasone-salmeterol (ADVAIR) 500-50 MCG/ACT AEPB Inhale 1 puff into the lungs in the morning and at bedtime.    [provider]  furosemide (LASIX) 40 MG tablet Take 1 tablet (40 mg total) by mouth 2 (two) times daily. 06/04/23 09/02/23  Gillis Santa, MD  hydrALAZINE (APRESOLINE) 100 MG tablet Take 1 tablet (100 mg total) by mouth 3 (three) times daily. 04/22/23   Wouk, Anette Riedel  Bedford, MD  ipratropium-albuterol (DUONEB) 0.5-2.5 (3) MG/3ML SOLN Take 3 mLs by nebulization every 6 (six) hours as needed (SOB). 03/27/23   Arnetha Courser, MD  levothyroxine (SYNTHROID) 150 MCG tablet Take 150 mcg by mouth daily.    [provider]  lidocaine (LIDODERM) 5 % Place 1 patch onto the skin every 12 (twelve) hours. Remove & Discard patch within 12 hours or as directed by MD 06/08/22   Sharman Cheek, MD  loperamide (IMODIUM) 2 MG capsule Take 1 capsule (2 mg total) by mouth as needed for diarrhea or loose stools. 08/05/22   Arnetha Courser, MD  metoprolol tartrate (LOPRESSOR) 50 MG tablet Take 25 mg by mouth 2 (two) times daily.    [provider]  montelukast (SINGULAIR) 10 MG tablet Take 10 mg by mouth at bedtime.    [provider]  Multiple Minerals-Vitamins (CALCIUM & VIT D3 BONE HEALTH PO) Take 1 tablet by mouth daily. 600 mg/ 25 mg    [provider]  potassium chloride (KLOR-CON) 10 MEQ tablet Take 10 mEq by mouth daily. 05/30/23 05/29/24  [provider]  rosuvastatin (CRESTOR) 40 MG tablet Take 40 mg by mouth daily.     [provider]  sertraline (ZOLOFT) 100 MG tablet Take 100 mg by mouth daily.    [provider]  Vitamin D, Ergocalciferol, (DRISDOL) 1.25 MG (50000 UNIT) CAPS capsule Take 1 capsule (50,000 Units total) by mouth every 7 (seven) days. 06/10/23 09/08/23  Gillis Santa, MD    Physical Exam: Vitals:   07/30/23 0130 07/30/23 0131 07/30/23 0139 07/30/23 0200  BP: 132/62     Pulse: (!) 117 93 92 91  Resp: 17 (!) 25  (!) 22  Temp:      SpO2: 97% 94%  95%  Weight:      Height:       Physical Exam Vitals and nursing note reviewed.  Constitutional:      General: She is not in acute distress. HENT:     Head: Normocephalic and atraumatic.  Cardiovascular:     Rate and Rhythm: Regular rhythm. Tachycardia present.     Heart sounds: Normal heart sounds.  Pulmonary:     Effort: Tachypnea present.     Breath sounds: Normal breath sounds.  Abdominal:     Palpations: Abdomen is soft.     Tenderness: There is no abdominal tenderness.  Musculoskeletal:     Comments: See picture below.  Firmness to the swollen area with warmth and tenderness  Neurological:     Mental Status: Mental status is at baseline.     Labs on Admission: I have personally reviewed following labs and imaging studies  CBC: Recent Labs  Lab 07/29/23 2324  WBC 8.9  NEUTROABS 6.2  HGB 8.5*  HCT 26.1*  MCV 84.7  PLT 204   Basic Metabolic Panel: Recent Labs  Lab 07/29/23 2324  NA 135  K 2.9*  CL 100  CO2 21*  GLUCOSE 106*  BUN 95*  CREATININE 4.57*  CALCIUM 10.4*  MG 1.7   GFR: Estimated Creatinine Clearance: 8.7 mL/min (A) (by C-G formula based on SCr of 4.57 mg/dL (H)). Liver Function Tests: Recent Labs  Lab 07/29/23 2324  AST 30  ALT 19  ALKPHOS 84  BILITOT 0.9  PROT 7.8  ALBUMIN 3.4*   No results for input(s): "LIPASE", "AMYLASE" in the last 168 hours. No results for input(s): "AMMONIA" in the last 168 hours. Coagulation Profile: Recent Labs  Lab 07/29/23 2324  INR  1.6*   Cardiac Enzymes: No results for input(s): "CKTOTAL", "CKMB", "CKMBINDEX", "TROPONINI" in the last 168 hours. BNP (last 3 results) No results for input(s): "PROBNP" in the last 8760 hours. HbA1C: No results for input(s): "HGBA1C" in the last 72 hours. CBG: No results for input(s): "GLUCAP" in the last 168 hours. Lipid Profile: No results for input(s): "CHOL", "HDL", "LDLCALC", "TRIG", "CHOLHDL", "LDLDIRECT" in the last 72 hours. Thyroid Function Tests: No results for input(s): "TSH", "T4TOTAL", "FREET4", "T3FREE", "THYROIDAB" in the last 72 hours. Anemia Panel: No results for input(s): "VITAMINB12", "FOLATE", "FERRITIN", "TIBC", "IRON", "RETICCTPCT" in the last 72 hours. Urine analysis:    Component Value Date/Time   COLORURINE YELLOW (A) 04/18/2023 1327   APPEARANCEUR TURBID (A) 04/18/2023 1327   APPEARANCEUR Clear 09/17/2018 1028   LABSPEC 1.010 04/18/2023 1327   LABSPEC 1.013 06/20/2012 1508   PHURINE 7.0 04/18/2023 1327   GLUCOSEU NEGATIVE 04/18/2023 1327   GLUCOSEU Negative 06/20/2012 1508   HGBUR SMALL (A) 04/18/2023 1327   BILIRUBINUR NEGATIVE 04/18/2023 1327   BILIRUBINUR Negative 09/17/2018 1028   BILIRUBINUR Negative 06/20/2012 1508   KETONESUR NEGATIVE 04/18/2023 1327   PROTEINUR 100 (A) 04/18/2023 1327   NITRITE NEGATIVE 04/18/2023 1327   LEUKOCYTESUR LARGE (A) 04/18/2023 1327   LEUKOCYTESUR Negative 06/20/2012 1508    Radiological Exams on Admission: DG Tibia/Fibula Right  Result Date: 07/30/2023 CLINICAL DATA:  Pain and swelling EXAM: RIGHT TIBIA AND FIBULA - 2 VIEW COMPARISON:  None Available. FINDINGS: No fracture or malalignment. Vascular calcifications. Protuberant soft tissue swelling over the mid to distal anterior lower leg. IMPRESSION: No acute osseous abnormality. Protuberant soft tissue swelling over the mid to distal anterior lower leg. Electronically Signed   By: Jasmine Pang M.D.   On: 07/30/2023 01:20   DG Chest Portable 1 View  Result  Date: 07/30/2023 CLINICAL DATA:  Dyspnea EXAM: PORTABLE CHEST 1 VIEW COMPARISON:  06/01/2023 FINDINGS: Cardiomegaly. No acute airspace disease, pleural effusion, or pneumothorax. IMPRESSION: No active disease. Cardiomegaly. Electronically Signed   By: Jasmine Pang M.D.   On: 07/30/2023 00:29     Data Reviewed: Relevant notes from primary care and specialist visits, past discharge summaries as available in EHR, including Care Everywhere. Prior diagnostic testing as pertinent to current admission diagnoses Updated medications and problem lists for reconciliation ED course, including vitals, labs, imaging, treatment and response to treatment Triage notes, nursing and pharmacy notes and ED provider's notes Notable results as noted in HPI   Assessment and Plan: * Atrial fibrillation with rapid ventricular response (HCC) Converted to sinus with IV metoprolol in the ED Continue oral metoprolol Continue apixaban  Elevated troponin Troponin bump from 79->157 Suspect demand ischemia from rapid A-fib Patient denies chest pain and EKG with no ischemic changes  Hypokalemia Received IV and oral repletion in the ED Likely secondary to diuretics Continue to monitor and replete as needed  Acute on chronic diastolic congestive heart failure (HCC) Patient short of breath, BNP 293, chest x-ray showing cardiomegaly Continue GDMT Daily weights with intake and output monitoring EF 60 to 65% 04/2023  Acute renal failure superimposed on stage 4 chronic kidney disease (HCC) Anemia of CKD Creatinine 4.57 up from baseline of 2.33, suspect related to diuretics continue to monitor and avoid nephrotoxins Nephrology consulted   Traumatic hematoma of lower leg with infection, right, initial encounter Possible infection Will continue ceftriaxone Surgery consulted for consideration of need for hematoma evacuation  COPD (chronic obstructive pulmonary disease) (HCC) Not acutely exacerbated Continue home  inhalers with DuoNebs as needed  OSA on CPAP CPAP nightly  Hypothyroidism Continue levothyroxine 150 mcg    DVT prophylaxis: elizuis  Consults: Surgery and nephrology  Advance Care Planning:   Code Status: Prior   Family Communication: none  Disposition Plan: Back to previous home environment  Severity of Illness: The appropriate patient status for this patient is INPATIENT. Inpatient status is judged to be reasonable and necessary in order to provide the required intensity of service to ensure the patient's safety. The patient's presenting symptoms, physical exam findings, and initial radiographic and laboratory data in the context of their chronic comorbidities is felt to place them at high risk for further clinical deterioration. Furthermore, it is not anticipated that the patient will be medically stable for discharge from the hospital within 2 midnights of admission.   * I certify that at the point of admission it is my clinical judgment that the patient will require inpatient hospital care spanning beyond 2 midnights from the point of admission due to high intensity of service, high risk for further deterioration and high frequency of surveillance required.*  Author: Andris Baumann, MD 07/30/2023 2:34 AM  For on call review [email or web address].

## 2023-07-30 NOTE — Assessment & Plan Note (Signed)
Continue levothyroxine 150 mcg

## 2023-07-30 NOTE — Consult Note (Signed)
[facility] SURGICAL CONSULTATION NOTE (initial) - cpt: 34742   HISTORY OF PRESENT ILLNESS (HPI):  82 y.o. female presented to [facility] ED yesterday for evaluation of shortness of breath. At that time, patient complained of palpations and worsening SOB. She was found to have Atrial fibrillation with RVR and was admitted to the medicine service. This improved with Metoprolol in the ED. Additionally, patient has a recent ED visit on 10/21 secondary to hitting her right leg on a truck running board while attempting to get into the truck. She developed significant ecchymosis from this. She is on Eliquis. She reports today that this is actually getting better compared to days prior. It is of course still tender. No fever, chills at home. She did report some weeping with dressing changes. She is also on Lasix. Work up in the ED revealed a normal WBC to 8.9K. She did have XR which showed swelling, no soft tissue gas.   Surgery is consulted by hospitalist physician Dr. Lindajo Royal, MD in this context for evaluation and management of right lower leg hematoma and possible infection.  PAST MEDICAL HISTORY (PMH):  Past Medical History:  Diagnosis Date   Actinic keratosis    Albuminuria    Anemia    Arthritis    Asthma    Basal cell carcinoma 04/12/2017   Above right lateral brow. Nodulocystic type. EDC   Cancer (HCC)    skin   Cataract cortical, senile    CHF (congestive heart failure) (HCC)    Diabetes mellitus without complication (HCC)    GERD (gastroesophageal reflux disease)    Hemorrhoids    History of kidney stones    Hyperlipidemia    Hypertension    Hypothyroidism    Lyme disease    No kidney function    OSA (obstructive sleep apnea)    Osteoporosis    Osteoporosis    Reflux esophagitis    Steatohepatitis    Steatohepatitis      PAST SURGICAL HISTORY (PSH):  Past Surgical History:  Procedure Laterality Date   ABDOMINAL HYSTERECTOMY     APPENDECTOMY     AV  FISTULA PLACEMENT Left 05/19/2022   Procedure: ARTERIOVENOUS (AV) FISTULA CREATION ( BRACHIAL CEPHALIC );  Surgeon: Renford Dills, MD;  Location: [facility] ORS;  Service: Vascular;  Laterality: Left;   CARDIAC CATHETERIZATION  1980   [facility]   CARDIAC CATHETERIZATION  08/13/2014   [facility]. no significant CAD, normal LVEDP.    CATARACT EXTRACTION     CHOLECYSTECTOMY     COLONOSCOPY     COLONOSCOPY WITH PROPOFOL N/A 12/07/2016   Procedure: COLONOSCOPY WITH PROPOFOL;  Surgeon: Christena Deem, MD;  Location: [facility] ENDOSCOPY;  Service: Endoscopy;  Laterality: N/A;   ESOPHAGOGASTRODUODENOSCOPY     ESOPHAGOGASTRODUODENOSCOPY (EGD) WITH PROPOFOL N/A 12/07/2016   Procedure: ESOPHAGOGASTRODUODENOSCOPY (EGD) WITH PROPOFOL;  Surgeon: Christena Deem, MD;  Location: [facility] ENDOSCOPY;  Service: Endoscopy;  Laterality: N/A;   ESOPHAGOGASTRODUODENOSCOPY (EGD) WITH PROPOFOL N/A 01/07/2018   Procedure: ESOPHAGOGASTRODUODENOSCOPY (EGD) WITH PROPOFOL;  Surgeon: Christena Deem, MD;  Location: [facility] ENDOSCOPY;  Service: Endoscopy;  Laterality: N/A;   EYE SURGERY     HEMORRHOID SURGERY     PARTIAL HYSTERECTOMY     THYROIDECTOMY N/A 08/25/2019   Procedure: THYROIDECTOMY EXTRACTION OF SUBTOTAL COMPONENT; PARATHYROID AUTOTRANSPLANT X1;  Surgeon: Duanne Guess, MD;  Location: [facility] ORS;  Service: General;  Laterality: N/A;  With Nerve Monitoring(RLN)   TONSILLECTOMY       MEDICATIONS:  Prior to Admission  medications   Medication Sig Start Date End Date Taking? Authorizing Provider  acetaminophen (TYLENOL) 500 MG tablet Take 1,000 mg by mouth every 6 (six) hours as needed for mild pain or moderate pain.   Yes [provider]  albuterol (PROVENTIL HFA;VENTOLIN HFA) 108 (90 BASE) MCG/ACT inhaler Inhale 2 puffs into the lungs every 6 (six) hours as needed for wheezing or shortness of breath.   Yes [provider]  allopurinol (ZYLOPRIM) 100 MG tablet Take 1 tablet (100 mg total) by mouth daily. 06/05/23  06/04/24 Yes Gillis Santa, MD  amLODipine (NORVASC) 5 MG tablet Take 1 tablet (5 mg total) by mouth 2 (two) times daily. 06/06/23 06/05/24 Yes Hammock, Lavonna Rua, NP  apixaban (ELIQUIS) 2.5 MG TABS tablet Take 1 tablet (2.5 mg total) by mouth 2 (two) times daily. 06/04/23 06/03/24 Yes Gillis Santa, MD  azelastine (ASTELIN) 0.1 % nasal spray Place 1 spray into both nostrils 2 (two) times daily.   Yes [provider]  cyanocobalamin (VITAMIN B12) 500 MCG tablet Take 1 tablet (500 mcg total) by mouth daily. 06/05/23 09/03/23 Yes Gillis Santa, MD  esomeprazole (NEXIUM) 40 MG capsule Take 40 mg by mouth daily before breakfast.   Yes [provider]  fluticasone (FLONASE) 50 MCG/ACT nasal spray Place 2 sprays into both nostrils daily.   Yes [provider]  fluticasone-salmeterol (ADVAIR) 500-50 MCG/ACT AEPB Inhale 1 puff into the lungs in the morning and at bedtime.   Yes [provider]  furosemide (LASIX) 40 MG tablet Take 1 tablet (40 mg total) by mouth 2 (two) times daily. 06/04/23 09/02/23 Yes Gillis Santa, MD  hydrALAZINE (APRESOLINE) 100 MG tablet Take 1 tablet (100 mg total) by mouth 3 (three) times daily. 04/22/23  Yes Wouk, Wilfred Curtis, MD  ipratropium-albuterol (DUONEB) 0.5-2.5 (3) MG/3ML SOLN Take 3 mLs by nebulization every 6 (six) hours as needed (SOB). 03/27/23  Yes Arnetha Courser, MD  levothyroxine (SYNTHROID) 150 MCG tablet Take 150 mcg by mouth daily.   Yes [provider]  lidocaine (LIDODERM) 5 % Place 1 patch onto the skin every 12 (twelve) hours. Remove & Discard patch within 12 hours or as directed by MD 06/08/22  Yes Sharman Cheek, MD  loperamide (IMODIUM) 2 MG capsule Take 1 capsule (2 mg total) by mouth as needed for diarrhea or loose stools. 08/05/22  Yes Arnetha Courser, MD  metoprolol tartrate (LOPRESSOR) 50 MG tablet Take 25 mg by mouth 2 (two) times daily.   Yes [provider]  montelukast (SINGULAIR) 10 MG tablet Take 10 mg by mouth at  bedtime.   Yes [provider]  Multiple Minerals-Vitamins (CALCIUM & VIT D3 BONE HEALTH PO) Take 1 tablet by mouth daily. 600 mg/ 25 mg   Yes [provider]  potassium chloride (KLOR-CON) 10 MEQ tablet Take 10 mEq by mouth daily. 05/30/23 05/29/24 Yes [provider]  rosuvastatin (CRESTOR) 40 MG tablet Take 40 mg by mouth daily.   Yes [provider]  sertraline (ZOLOFT) 100 MG tablet Take 100 mg by mouth daily.   Yes [provider]  Vitamin D, Ergocalciferol, (DRISDOL) 1.25 MG (50000 UNIT) CAPS capsule Take 1 capsule (50,000 Units total) by mouth every 7 (seven) days. 06/10/23 09/08/23 Yes Gillis Santa, MD  colchicine 0.6 MG tablet Take 0.5 tablets (0.3 mg total) by mouth daily for 8 days. 06/05/23 06/13/23  Gillis Santa, MD     ALLERGIES:  Allergies  Allergen Reactions   Ace Inhibitors  Other reaction(s): Unknown   Egg-Derived Products Diarrhea   Other     Other reaction(s): Other (See Comments) Eggs   Prednisone     Other reaction(s): Other (See Comments) joint pain   Risedronate     Other reaction(s): Other (See Comments)   Sulfa Antibiotics Itching and Swelling    Other reaction(s): Other (See Comments)   Sulfasalazine Other (See Comments)     SOCIAL HISTORY:  Social History   Socioeconomic History   Marital status: Married    Spouse name: Not on file   Number of children: Not on file   Years of education: Not on file   Highest education level: Associate degree: academic program  Occupational History   Not on file  Tobacco Use   Smoking status: Never   Smokeless tobacco: Never  Vaping Use   Vaping status: Never Used  Substance and Sexual Activity   Alcohol use: No   Drug use: No   Sexual activity: Not on file  Other Topics Concern   Not on file  Social History Narrative   Lives at home with husband .   Social Determinants of Health   Financial Resource Strain: Low Risk  (06/12/2023)   Overall Financial Resource  Strain (CARDIA)    Difficulty of Paying Living Expenses: Not hard at all  Food Insecurity: No Food Insecurity (07/30/2023)   Hunger Vital Sign    Worried About Running Out of Food in the Last Year: Never true    Ran Out of Food in the Last Year: Never true  Transportation Needs: No Transportation Needs (07/30/2023)   PRAPARE - Administrator, Civil Service (Medical): No    Lack of Transportation (Non-Medical): No  Physical Activity: Insufficiently Active (06/12/2023)   Exercise Vital Sign    Days of Exercise per Week: 7 days    Minutes of Exercise per Session: 20 min  Stress: No Stress Concern Present (06/12/2023)   [employer] of Occupational Health - Occupational Stress Questionnaire    Feeling of Stress : Only a little  Social Connections: Moderately Isolated (06/12/2023)   Social Connection and Isolation Panel [NHANES]    Frequency of Communication with Friends and Family: More than three times a week    Frequency of Social Gatherings with Friends and Family: More than three times a week    Attends Religious Services: Never    Database administrator or Organizations: No    Attends Engineer, structural: Not on file    Marital Status: Married  Catering manager Violence: Not At Risk (07/30/2023)   Humiliation, Afraid, Rape, and Kick questionnaire    Fear of Current or Ex-Partner: No    Emotionally Abused: No    Physically Abused: No    Sexually Abused: No     FAMILY HISTORY:  Family History  Problem Relation Age of Onset   Breast cancer Mother    Heart attack Father       REVIEW OF SYSTEMS:  Review of Systems  Constitutional:  Negative for chills and fever.  Respiratory:  Positive for sputum production. Negative for cough.   Cardiovascular:  Positive for chest pain and palpitations.  Gastrointestinal:  Negative for abdominal pain, nausea and vomiting.  Skin:  Negative for itching and rash.       + RLE Hematoma   All other systems reviewed and  are negative.   VITAL SIGNS:  Temp:  [98 F (36.7 C)] 98 F (36.7 C) (10/28 0300) Pulse  Rate:  [77-126] 77 (10/28 0500) Resp:  [16-25] 25 (10/28 0500) BP: (122-141)/(42-87) 122/42 (10/28 0500) SpO2:  [94 %-98 %] 95 % (10/28 0500) Weight:  [70.7 kg] 70.7 kg (10/27 2319)     Height: 5\' 2"  (157.5 cm) Weight: 70.7 kg BMI (Calculated): 28.49   INTAKE/OUTPUT:  No intake/output data recorded.  PHYSICAL EXAM:  Physical Exam Vitals and nursing note reviewed. Exam conducted with a chaperone present.  Constitutional:      General: She is not in acute distress.    Appearance: Normal appearance. She is not ill-appearing.     Comments: Resting in bed; NAD  HENT:     Head: Normocephalic and atraumatic.  Eyes:     General: No scleral icterus.    Conjunctiva/sclera: Conjunctivae normal.  Pulmonary:     Effort: Pulmonary effort is normal. No respiratory distress.  Genitourinary:    Comments: Deferred Musculoskeletal:     Right lower leg: Edema present.     Left lower leg: No edema.       Legs:     Comments: To the right lower extremity there is significant ecchymosis present extending from the proximal shin to the foot, patient reports this is improved. There is an area of softness to the lateral aspect of the lower leg consistent with underlying hematoma. There is no significant erythema to suggest infection. There is no appreciable drainage. Pulses are intact distally.   Skin:    General: Skin is warm and dry.     Coloration: Skin is not jaundiced.     Findings: Ecchymosis present.  Neurological:     General: No focal deficit present.     Mental Status: She is alert and oriented to person, place, and time.  Psychiatric:        Mood and Affect: Mood normal.        Behavior: Behavior normal.      Labs:     Latest Ref Rng & Units 07/29/2023   11:24 PM 06/04/2023    5:05 AM 06/03/2023    5:31 AM  CBC  WBC 4.0 - 10.5 K/uL 8.9  6.0  5.9   Hemoglobin 12.0 - 15.0 g/dL 8.5  8.9  8.7    Hematocrit 36.0 - 46.0 % 26.1  28.0  26.7   Platelets 150 - 400 K/uL 204  208  180       Latest Ref Rng & Units 07/29/2023   11:24 PM 06/04/2023    5:05 AM 06/03/2023    5:31 AM  CMP  Glucose 70 - 99 mg/dL 161  096  045   BUN 8 - 23 mg/dL 95  72  75   Creatinine 0.44 - 1.00 mg/dL 4.09  8.11  9.14   Sodium 135 - 145 mmol/L 135  139  136   Potassium 3.5 - 5.1 mmol/L 2.9  4.0  3.4   Chloride 98 - 111 mmol/L 100  107  102   CO2 22 - 32 mmol/L 21  26  24    Calcium 8.9 - 10.3 mg/dL 78.2  8.8  7.9   Total Protein 6.5 - 8.1 g/dL 7.8     Total Bilirubin 0.3 - 1.2 mg/dL 0.9     Alkaline Phos 38 - 126 U/L 84     AST 15 - 41 U/L 30     ALT 0 - 44 U/L 19        Imaging studies:   XR Right Tib/Fib (07/30/2023) personally reviewed and agree with  radiologist interpretation reviewed below:  IMPRESSION: No acute osseous abnormality. Protuberant soft tissue swelling over the mid to distal anterior lower leg.   Assessment/Plan:  82 y.o. female with traumatic right lower leg hematoma without overt evidence of infection, complicated by pertinent comorbidities including including Afib with RVR requiring anticoagulation, CHF, CKD, HTN.   - Right lower leg evaluated this morning at bedside. There is significant ecchymosis consistent with her reported fall 7 days ago and need for anticoagulation. There is an area of softness to the lateral aspect but I do believe this is more consistent with hematoma. I do not appreciate any evidence of abscess. She is also afebrile and without leukocytosis. Previous erythema noted by EDP seems to be resolved. I do not think she warrants any further imaging and certainly not any procedures (ie: I&D) at this time. Would continue supportive compression wraps and elevation to aid in swelling. Reasonable to complete course of Abx (~7 days) given concern for possible cellulitis. Further management per primary service.    All of the above findings and recommendations were  discussed with the patient, and all of patient's questions were answered to her expressed satisfaction.  Thank you for the opportunity to participate in this patient's care.   -- Lynden Oxford, PA-C [facility] 07/30/2023, 7:15 AM M-F: 7am - 4pm

## 2023-07-31 DIAGNOSIS — S8011XA Contusion of right lower leg, initial encounter: Secondary | ICD-10-CM | POA: Diagnosis not present

## 2023-07-31 DIAGNOSIS — I4891 Unspecified atrial fibrillation: Secondary | ICD-10-CM | POA: Diagnosis not present

## 2023-07-31 DIAGNOSIS — L089 Local infection of the skin and subcutaneous tissue, unspecified: Secondary | ICD-10-CM | POA: Diagnosis not present

## 2023-07-31 LAB — TROPONIN I (HIGH SENSITIVITY): Troponin I (High Sensitivity): 165 ng/L (ref <18)

## 2023-07-31 LAB — BASIC METABOLIC PANEL
Anion gap: 12 (ref 5–15)
BUN: 104 mg/dL — ABNORMAL HIGH (ref 8–23)
CO2: 24 mmol/L (ref 22–32)
Calcium: 10.1 mg/dL (ref 8.9–10.3)
Chloride: 99 mmol/L (ref 98–111)
Creatinine, Ser: 4.54 mg/dL — ABNORMAL HIGH (ref 0.44–1.00)
GFR, Estimated: 9 mL/min — ABNORMAL LOW (ref >60)
Glucose, Bld: 86 mg/dL (ref 70–99)
Potassium: 3.6 mmol/L (ref 3.5–5.1)
Sodium: 135 mmol/L (ref 135–145)

## 2023-07-31 LAB — CBC WITH DIFFERENTIAL/PLATELET
Abs Immature Granulocytes: 0.03 10*3/uL (ref 0.00–0.07)
Basophils Absolute: 0.1 10*3/uL (ref 0.0–0.1)
Basophils Relative: 1 %
Eosinophils Absolute: 0.2 10*3/uL (ref 0.0–0.5)
Eosinophils Relative: 3 %
HCT: 26.5 % — ABNORMAL LOW (ref 36.0–46.0)
Hemoglobin: 8.5 g/dL — ABNORMAL LOW (ref 12.0–15.0)
Immature Granulocytes: 0 %
Lymphocytes Relative: 32 %
Lymphs Abs: 2.2 10*3/uL (ref 0.7–4.0)
MCH: 27.6 pg (ref 26.0–34.0)
MCHC: 32.1 g/dL (ref 30.0–36.0)
MCV: 86 fL (ref 80.0–100.0)
Monocytes Absolute: 0.8 10*3/uL (ref 0.1–1.0)
Monocytes Relative: 12 %
Neutro Abs: 3.6 10*3/uL (ref 1.7–7.7)
Neutrophils Relative %: 52 %
Platelets: 204 10*3/uL (ref 150–400)
RBC: 3.08 MIL/uL — ABNORMAL LOW (ref 3.87–5.11)
RDW: 16.4 % — ABNORMAL HIGH (ref 11.5–15.5)
WBC: 6.9 10*3/uL (ref 4.0–10.5)
nRBC: 0 % (ref 0.0–0.2)

## 2023-07-31 NOTE — Progress Notes (Signed)
[facility]  ROUNDING NOTE   Subjective:   [name] IS A 82 y.o. female with past medical conditions including diastolic heart failure, hypertension, hypothyroidism, COPD, and chronic kidney disease stage IV. Patient reports to ED with RLE pain. She has been admitted for Hypokalemia [E87.6] Elevated troponin level [R79.89] Demand ischemia (HCC) [I24.89] Chronic anemia [D64.9] Atrial fibrillation with rapid ventricular response (HCC) [I48.91] Atrial fibrillation with RVR (HCC) [I48.91] Current use of long term anticoagulation [Z79.01] Traumatic hematoma of lower leg with infection, right, subsequent encounter [S80.11XD, L08.9] Acute renal failure superimposed on chronic kidney disease, unspecified acute renal failure type, unspecified CKD stage (HCC) [N17.9, N18.9]  Patient is known to our practice and is followed by Dr Cherylann Ratel. He was last seen in office on 07/19/23.   Patient seen laying in bed Completed breakfast tray at bedside Denies pain States she feel well today Room air  Objective:  Vital signs in last 24 hours:  Temp:  [97.6 F (36.4 C)-98.9 F (37.2 C)] 97.6 F (36.4 C) (10/29 0300) Pulse Rate:  [63-94] 63 (10/29 0300) Resp:  [15-20] 20 (10/29 0300) BP: (125-146)/(40-57) 125/47 (10/29 0300) SpO2:  [94 %-100 %] 96 % (10/29 0300) Weight:  [72 kg] 72 kg (10/29 0500)  Weight change: 1.33 kg Filed Weights   07/29/23 2319 07/31/23 0500  Weight: 70.7 kg 72 kg    Intake/Output: No intake/output data recorded.   Intake/Output this shift:  No intake/output data recorded.  Physical Exam: General: NAD  Head: Normocephalic, atraumatic. Moist oral mucosal membranes  Eyes: Anicteric  Neck: Supple, trachea midline  Lungs:  Clear to auscultation, room air  Heart: Regular rate and rhythm  Abdomen:  Soft, nontender  Extremities:  1+ peripheral edema.RLE bruising  Neurologic: Nonfocal, moving all four extremities  Skin: RLE abrasions, edema, hot        Basic Metabolic Panel: Recent Labs  Lab 07/29/23 2324 07/30/23 1657 07/31/23 0411  NA 135 138 135  K 2.9* 3.6 3.6  CL 100 103 99  CO2 21* 22 24  GLUCOSE 106* 93 86  BUN 95* 105* 104*  CREATININE 4.57* 4.28* 4.54*  CALCIUM 10.4* 10.2 10.1  MG 1.7  --   --     Liver Function Tests: Recent Labs  Lab 07/29/23 2324  AST 30  ALT 19  ALKPHOS 84  BILITOT 0.9  PROT 7.8  ALBUMIN 3.4*   No results for input(s): "LIPASE", "AMYLASE" in the last 168 hours. No results for input(s): "AMMONIA" in the last 168 hours.  CBC: Recent Labs  Lab 07/29/23 2324 07/31/23 0411  WBC 8.9 6.9  NEUTROABS 6.2 3.6  HGB 8.5* 8.5*  HCT 26.1* 26.5*  MCV 84.7 86.0  PLT 204 204    Cardiac Enzymes: No results for input(s): "CKTOTAL", "CKMB", "CKMBINDEX", "TROPONINI" in the last 168 hours.  BNP: Invalid input(s): "POCBNP"  CBG: No results for input(s): "GLUCAP" in the last 168 hours.  Microbiology: Results for orders placed or performed during the hospital encounter of 04/16/23  SARS Coronavirus 2 by RT PCR (hospital order, performed in [facility] hospital lab) *cepheid single result test* Anterior Nasal Swab     Status: None   Collection Time: 04/16/23  2:10 PM   Specimen: Anterior Nasal Swab  Result Value Ref Range Status   SARS Coronavirus 2 by RT PCR NEGATIVE NEGATIVE Final    Comment: (NOTE) SARS-CoV-2 target nucleic acids are NOT DETECTED.  The SARS-CoV-2 RNA is generally detectable in upper and lower respiratory  specimens during the acute phase of infection. The lowest concentration of SARS-CoV-2 viral copies this assay can detect is 250 copies / mL. A negative result does not preclude SARS-CoV-2 infection and should not be used as the sole basis for treatment or other patient management decisions.  A negative result may occur with improper specimen collection / handling, submission of specimen other than nasopharyngeal swab, presence of viral mutation(s) within the areas  targeted by this assay, and inadequate number of viral copies (<250 copies / mL). A negative result must be combined with clinical observations, patient history, and epidemiological information.  Fact Sheet for Patients:   RoadLapTop.co.za  Fact Sheet for Healthcare Providers: http://kim-miller.com/  This test is not yet approved or  cleared by the [place] FDA and has been authorized for detection and/or diagnosis of SARS-CoV-2 by FDA under an Emergency Use Authorization (EUA).  This EUA will remain in effect (meaning this test can be used) for the duration of the COVID-19 declaration under Section 564(b)(1) of the Act, 21 U.S.C. section 360bbb-3(b)(1), unless the authorization is terminated or revoked sooner.  Performed at [facility], [address] [place], [place] [address]   Respiratory (~20 pathogens) panel by PCR     Status: None   Collection Time: 04/18/23  1:27 PM   Specimen: Nasopharyngeal Swab; Respiratory  Result Value Ref Range Status   Adenovirus NOT DETECTED NOT DETECTED Final   Coronavirus 229E NOT DETECTED NOT DETECTED Final    Comment: (NOTE) The Coronavirus on the Respiratory Panel, DOES NOT test for the novel  Coronavirus (2019 nCoV)    Coronavirus HKU1 NOT DETECTED NOT DETECTED Final   Coronavirus NL63 NOT DETECTED NOT DETECTED Final   Coronavirus OC43 NOT DETECTED NOT DETECTED Final   Metapneumovirus NOT DETECTED NOT DETECTED Final   Rhinovirus / Enterovirus NOT DETECTED NOT DETECTED Final   Influenza A NOT DETECTED NOT DETECTED Final   Influenza B NOT DETECTED NOT DETECTED Final   Parainfluenza Virus 1 NOT DETECTED NOT DETECTED Final   Parainfluenza Virus 2 NOT DETECTED NOT DETECTED Final   Parainfluenza Virus 3 NOT DETECTED NOT DETECTED Final   Parainfluenza Virus 4 NOT DETECTED NOT DETECTED Final   Respiratory Syncytial Virus NOT DETECTED NOT DETECTED Final   Bordetella pertussis NOT  DETECTED NOT DETECTED Final   Bordetella Parapertussis NOT DETECTED NOT DETECTED Final   Chlamydophila pneumoniae NOT DETECTED NOT DETECTED Final   Mycoplasma pneumoniae NOT DETECTED NOT DETECTED Final    Comment: Performed at [facility], [address] [place], [place] [address]  Urine Culture (for pregnant, neutropenic or urologic patients or patients with an indwelling urinary catheter)     Status: Abnormal   Collection Time: 04/18/23  1:28 PM   Specimen: Urine, Clean Catch  Result Value Ref Range Status   Specimen Description   Final    URINE, CLEAN CATCH Performed at [facility], [address] [place], [place] [address]    Special Requests   Final    NONE Performed at [facility], [address] [place], [place] [address]    Culture MULTIPLE SPECIES PRESENT, SUGGEST RECOLLECTION (A)  Final   Report Status 04/19/2023 FINAL  Final  Expectorated Sputum Assessment w Gram Stain, Rflx to Resp Cult     Status: None   Collection Time: 04/19/23  2:00 PM   Specimen: Sputum  Result Value Ref Range Status   Specimen Description SPUTUM  Final   Special Requests NONE  Final   Sputum evaluation   Final    THIS SPECIMEN IS ACCEPTABLE FOR SPUTUM CULTURE Performed at [facility], [address] [place], [place] [address]    Report Status 04/19/2023 FINAL  Final  Culture, Respiratory w Gram Stain     Status: None   Collection Time: 04/19/23  2:00 PM   Specimen: SPU  Result Value Ref Range Status   Specimen Description   Final    SPUTUM Performed at [facility], [address], [place], [place] [address]    Special Requests   Final    NONE Reflexed from [id number] Performed at [facility], [address] [place], [place] [address]    Gram Stain   Final    FEW SQUAMOUS EPITHELIAL CELLS PRESENT FEW WBC PRESENT, PREDOMINANTLY MONONUCLEAR MODERATE GRAM POSITIVE COCCI IN PAIRS AND CHAINS MODERATE GRAM NEGATIVE RODS FEW  YEAST    Culture   Final    MODERATE Normal respiratory flora-no Staph aureus or Pseudomonas seen Performed at [facility], [address], [place], [place] [address]    Report Status 04/23/2023 FINAL  Final    Coagulation Studies: Recent Labs    07/29/23 2324  LABPROT 18.8*  INR 1.6*    Urinalysis: No results for input(s): "COLORURINE", "LABSPEC", "PHURINE", "GLUCOSEU", "HGBUR", "BILIRUBINUR", "KETONESUR", "PROTEINUR", "UROBILINOGEN", "NITRITE", "LEUKOCYTESUR" in the last 72 hours.  Invalid input(s): "APPERANCEUR"    Imaging: DG Tibia/Fibula Right  Result Date: 07/30/2023 CLINICAL DATA:  Pain and swelling EXAM: RIGHT TIBIA AND FIBULA - 2 VIEW COMPARISON:  None Available. FINDINGS: No fracture or malalignment. Vascular calcifications. Protuberant soft tissue swelling over the mid to distal anterior lower leg. IMPRESSION: No acute osseous abnormality. Protuberant soft tissue swelling over the mid to distal anterior lower leg. Electronically Signed   By: Jasmine Pang M.D.   On: 07/30/2023 01:20   DG Chest Portable 1 View  Result Date: 07/30/2023 CLINICAL DATA:  Dyspnea EXAM: PORTABLE CHEST 1 VIEW COMPARISON:  06/01/2023 FINDINGS: Cardiomegaly. No acute airspace disease, pleural effusion, or pneumothorax. IMPRESSION: No active disease. Cardiomegaly. Electronically Signed   By: Jasmine Pang M.D.   On: 07/30/2023 00:29     Medications:    cefTRIAXone (ROCEPHIN)  IV 1 g (07/30/23 2101)    levothyroxine  150 mcg Oral Daily   metoprolol tartrate  25 mg Oral BID   potassium chloride  10 mEq Oral Daily   acetaminophen **OR** acetaminophen, HYDROcodone-acetaminophen, ipratropium-albuterol, ondansetron **OR** ondansetron (ZOFRAN) IV  Assessment/ Plan:  Ms. TARYNN CONTRERA is a 82 y.o.  female with past medical conditions including diastolic heart failure, hypertension, hypothyroidism, COPD, and chronic kidney disease stage IV Patient reports to ED with RLE pain. She has been  admitted for Hypokalemia [E87.6] Elevated troponin level [R79.89] Demand ischemia (HCC) [I24.89] Chronic anemia [D64.9] Atrial fibrillation with rapid ventricular response (HCC) [I48.91] Atrial fibrillation with RVR (HCC) [I48.91] Current use of long term anticoagulation [Z79.01] Traumatic hematoma of lower leg with infection, right, subsequent encounter [S80.11XD, L08.9] Acute renal failure superimposed on chronic kidney disease, unspecified acute renal failure type, unspecified CKD stage (HCC) [N17.9, N18.9]   Acute Kidney Injury on chronic kidney disease stage IV with baseline creatinine 2.33 and GFR of 20 on 06/03/23.  Acute kidney injury secondary to cardiorenal  syndrome and concurrent illness. Creatinine elevated to 4.57 on admission. Remains elevated today. Will hold Furosemide for now and monitor.   Lab Results  Component Value Date   CREATININE 4.54 (H) 07/31/2023  CREATININE 4.28 (H) 07/30/2023   CREATININE 4.57 (H) 07/29/2023   No intake or output data in the 24 hours ending 07/31/23 1002  2. Anemia of chronic kidney disease Lab Results  Component Value Date   HGB 8.5 (L) 07/31/2023    Hgb decreased but stable. Will monitor for now.   3. Hypokalemia, potassium 3.6. corrected with oral and IV supplementation ordered by primary.   4. Secondary Hyperparathyroidism: with outpatient labs: PTH 7, phosphorus 4.7, calcium 12.4 on 07/19/23.   Lab Results  Component Value Date   CALCIUM 10.1 07/31/2023   PHOS 3.6 06/04/2023   Will continue to monitor during this admission.    LOS: 1 Lauralyn Shadowens 10/29/202410:02 AM

## 2023-07-31 NOTE — TOC Progression Note (Signed)
Transition of Care [facility]) - Progression Note    Patient Details  Name: [name] MRN: [id number] Date of Birth: 1941-03-17  Transition [facility] CM/SW Contact  Darolyn Rua, [place] Phone Number: 07/31/2023, 2:47 PM  Clinical Narrative:     10/29: CSW notes SNF recs from PT. Patient informed by CSW, she reports she wants to go home at discharge and resume the home health she had prior to admission Reports she feels she can do better with PT tomorrow, that she took some pain meds.     10/28: Readmission prevention screen complete. CSW met with patient. Husband at bedside. CSW introduced role and explained that discharge planning would be discussed. PCP is Einar Crow, MD. Husband or niece drive her to appointments. She uses [facility]. No issues obtaining medications. Patient lives home with her husband. She is active with [facility] for RN and PT. Liaison said they can add whatever services she needs at discharge. Patient reports having a wheelchair, RW, rollator, SPC, and shower chair. No further concerns. CSW encouraged patient and her husband to contact CSW as needed. CSW will continue to follow patient and her husband for support and facilitate return home once stable.                 Expected Discharge Plan: Home w Home Health Services Barriers to Discharge: Continued Medical Work up  Expected Discharge Plan and Services     Post Acute Care Choice: Resumption of Svcs/PTA Provider Living arrangements for the past 2 months: Single Family Home                           HH Arranged: RN, PT [facility] Agency: [facility] Date [facility] Agency Contacted: 07/30/23   Representative spoke with at [facility] Agency: Becky Sax   Social Determinants of Health (SDOH) Interventions SDOH Screenings   Food Insecurity: No Food Insecurity (07/30/2023)  Housing: Low Risk  (07/30/2023)  Transportation Needs: No Transportation Needs (07/30/2023)   Utilities: Not At Risk (07/30/2023)  Financial Resource Strain: Low Risk  (06/12/2023)  Physical Activity: Insufficiently Active (06/12/2023)  Social Connections: Moderately Isolated (06/12/2023)  Stress: No Stress Concern Present (06/12/2023)  Tobacco Use: Low Risk  (07/30/2023)    Readmission Risk Interventions    07/30/2023   10:37 AM 04/19/2023    1:10 PM 03/27/2023   12:56 PM  Readmission Risk Prevention Plan  Transportation Screening Complete Complete Complete  Medication Review Oceanographer) Complete Complete Complete  PCP or Specialist appointment within 3-5 days of discharge Complete  Complete  HRI or Home Care Consult Complete Complete Complete  SW Recovery Care/Counseling Consult Complete Complete Complete  Palliative Care Screening Not Applicable Not Applicable Not Applicable  Skilled Nursing Facility Not Applicable Not Applicable Not Applicable

## 2023-07-31 NOTE — Progress Notes (Signed)
[facility] SURGICAL PROGRESS NOTE (cpt [phone])  Hospital Day(s): 1.   Interval History: Patient seen and examined, no acute events or new complaints overnight. Patient reports intermittent waxing and waning pain in her right lower leg. Pain medications are helping but she feels she went too long without one. No fever, chills. She continues to remain without leukocytosis; WBC 6.9K.  Review of Systems:  Constitutional: denies fever, chills  HEENT: denies cough or congestion  Respiratory: denies any shortness of breath  Cardiovascular: denies chest pain or palpitations  Genitourinary: denies burning with urination or urinary frequency Musculoskeletal: + fall (history of); + RLE Hematoma   Vital signs in last 24 hours: [min-max] current  Temp:  [97.6 F (36.4 C)-98.9 F (37.2 C)] 97.7 F (36.5 C) (10/29 1217) Pulse Rate:  [63-88] 67 (10/29 1217) Resp:  [15-20] 16 (10/29 1217) BP: (125-146)/(40-57) 142/49 (10/29 1217) SpO2:  [94 %-100 %] 98 % (10/29 1217) Weight:  [72 kg] 72 kg (10/29 0500)     Height: 5\' 2"  (157.5 cm) Weight: 72 kg BMI (Calculated): 29.02   Intake/Output last 2 shifts:  No intake/output data recorded.   Physical Exam:  Constitutional: alert, cooperative and no distress  HENT: normocephalic without obvious abnormality  Eyes: PERRL, EOM's grossly intact and symmetric  Respiratory: breathing non-labored at rest  Integumentary / Musculoskeletal:Stable appearance of large hematoma to the right lower extremity with ecchymosis extending from the proximal calf/shin to the mid-foot, there is appreciable area of softness to the lateral aspect of this, no drainage. Very subtle erythema; improving.     Labs:     Latest Ref Rng & Units 07/31/2023    4:11 AM 07/29/2023   11:24 PM 06/04/2023    5:05 AM  CBC  WBC 4.0 - 10.5 K/uL 6.9  8.9  6.0   Hemoglobin 12.0 - 15.0 g/dL 8.5  8.5  8.9   Hematocrit 36.0 - 46.0 % 26.5  26.1  28.0   Platelets 150 - 400 K/uL  204  204  208       Latest Ref Rng & Units 07/31/2023    4:11 AM 07/30/2023    4:57 PM 07/29/2023   11:24 PM  CMP  Glucose 70 - 99 mg/dL 86  93  086   BUN 8 - 23 mg/dL 578  469  95   Creatinine 0.44 - 1.00 mg/dL 6.29  5.28  4.13   Sodium 135 - 145 mmol/L 135  138  135   Potassium 3.5 - 5.1 mmol/L 3.6  3.6  2.9   Chloride 98 - 111 mmol/L 99  103  100   CO2 22 - 32 mmol/L 24  22  21    Calcium 8.9 - 10.3 mg/dL 24.4  01.0  27.2   Total Protein 6.5 - 8.1 g/dL   7.8   Total Bilirubin 0.3 - 1.2 mg/dL   0.9   Alkaline Phos 38 - 126 U/L   84   AST 15 - 41 U/L   30   ALT 0 - 44 U/L   19      Imaging studies: No new pertinent imaging studies   Assessment/Plan:  82 y.o. female with traumatic right lower leg hematoma with possible cellulitis without abscess, complicated by pertinent comorbidities including including Afib with RVR requiring anticoagulation, CHF, CKD, HTN.    - For now, will continue IV Abx out of caution. Does not appear to be developing abscess.    - Continue to monitor WBC; without  leukocytosis - She remains afebrile - Apin control prnl   - Okay to mobilize as tolerated   - Further management per primary service  - From surgical perspective, she is doing well. No need for surgical intervention. May be prudent to continue observation x24 hours to ensure no worsening. If RLE is stable tomorrow, okay to DC from our perspective with PO Abx. We will be happy to follow up outpatient in 7-10 days.    All of the above findings and recommendations were discussed with the patient, patient's family (husband), and the medical team, and all of patient's and family's questions were answered to their expressed satisfaction.  -- Lynden Oxford, PA-C [facility] 07/31/2023, 12:58 PM M-F: 7am - 4pm

## 2023-07-31 NOTE — Progress Notes (Signed)
Pt's home c-pap in place.

## 2023-07-31 NOTE — Progress Notes (Addendum)
Progress Note   Patient: [name] ZOX:[id number] DOB: Mar 31, 1941 DOA: 07/29/2023     1 DOS: the patient was seen and examined on 07/31/2023    Subjective:  Patient seen and examined at bedside this morning in the presence of the husband Admits to improvement in right leg pain and swelling Denies nausea vomiting abdominal pain or chest pain  Brief hospital course:  [name] is a 82 y.o. female with medical history significant for CKD 4, anemia of CKD, diastolic CHF, HTN, surgical hypothyroidism, gout COPD, OSA on CPAP, diagnosed with A-fib a month ago while hospitalized (8/30 to 9/2) for CHF exacerbation, who comes in with A-fib with RVR and CHF exacerbation after presenting with palpitations and shortness of breath. Of Note, patient was seen in the ED a week prior with a contusion with hematoma of the right lower extremity that she sustained after she hit her leg against the running board of her truck.   Hospitalist consulted for admission   Assessment and Plan:  Acute renal failure superimposed on stage 4 chronic kidney disease (HCC) Anemia of CKD Creatinine 4.57 up from baseline of 2.33, suspect related to diuretics continue to monitor and avoid nephrotoxins Nephrology on board and case discussed Renal function continues to remain high Monitor renal function closely as well as electrolytes   Atrial fibrillation with rapid ventricular response (HCC) Converted to sinus with IV metoprolol in the ED Continue oral metoprolol Continue apixaban   Elevated troponin likely due to demand ischemia Patient with no chest pains EKG did not show any ST segment changes aside A-fib Troponin has remained flat 79->157->165 Continue telemetry monitoring   Hypokalemia-improving Received IV and oral repletion in the ED Likely secondary to diuretics Continue repletion and monitoring   Chronic diastolic congestive heart failure (HCC) Patient short of breath, BNP 293, chest x-ray  showing cardiomegaly Continue metoprolol Daily weights with intake and output monitoring EF 60 to 65% 04/2023 Avoiding nephrotoxic agents   Traumatic hematoma of lower leg with infection, right, initial encounter Continue ceftriaxone Surgery consulted however at this time no surgical intervention being planned   COPD (chronic obstructive pulmonary disease) (HCC) Not acutely exacerbated Continue home inhalers with DuoNebs as needed   OSA on CPAP CPAP nightly   Hypothyroidism Continue levothyroxine 150 mcg       DVT prophylaxis: elizuis   Consults: Surgery and nephrology   Advance Care Planning:   Code Status: Prior    Family Communication: none   Disposition Plan: Back to previous home environment   Physical Exam:  Vitals and nursing note reviewed.  Constitutional:      General: She is not in acute distress. HENT:     Head: Normocephalic and atraumatic.  Cardiovascular:     Rate and Rhythm: Tachycardia improved    Heart sounds: Normal heart sounds.  Pulmonary:     Effort: Tachypnea present.     Breath sounds: Normal breath sounds.  Abdominal:     Palpations: Abdomen is soft.     Tenderness: There is no abdominal tenderness.  Musculoskeletal:     Comments: See picture below.  Firmness to the swollen area with warmth and tenderness  Neurological:     Mental Status: Mental status is at baseline.        Vitals:   07/31/23 0000 07/31/23 0300 07/31/23 0500 07/31/23 1217  BP: (!) 129/44 (!) 125/47  (!) 142/49  Pulse: 65 63  67  Resp: 15 20  16   Temp: 98 F (  36.7 C) 97.6 F (36.4 C)  97.7 F (36.5 C)  TempSrc: Oral Oral  Oral  SpO2: 94% 96%  98%  Weight:   72 kg   Height:       Data Reviewed: I have reviewed the patient's lab report as shown as well as vitals, nephrology documentation, surgeon documentation and case discussed.  Have also reviewed imaging involving the right lower extremity that did not show any osseous pathology except soft tissue  swelling    Latest Ref Rng & Units 07/31/2023    4:11 AM 07/30/2023    4:57 PM 07/29/2023   11:24 PM  BMP  Glucose 70 - 99 mg/dL 86  93  195   BUN 8 - 23 mg/dL 093  267  95   Creatinine 0.44 - 1.00 mg/dL 1.24  5.80  9.98   Sodium 135 - 145 mmol/L 135  138  135   Potassium 3.5 - 5.1 mmol/L 3.6  3.6  2.9   Chloride 98 - 111 mmol/L 99  103  100   CO2 22 - 32 mmol/L 24  22  21    Calcium 8.9 - 10.3 mg/dL 33.8  25.0  53.9        Latest Ref Rng & Units 07/31/2023    4:11 AM 07/29/2023   11:24 PM 06/04/2023    5:05 AM  CBC  WBC 4.0 - 10.5 K/uL 6.9  8.9  6.0   Hemoglobin 12.0 - 15.0 g/dL 8.5  8.5  8.9   Hematocrit 36.0 - 46.0 % 26.5  26.1  28.0   Platelets 150 - 400 K/uL 204  204  208     Family Communication: Discussed with patient's husband present at bedside  Disposition: Status is: Inpatient   Time spent: 58 minutes   Author: Loyce Dys, MD 07/31/2023 12:54 PM  For on call review [email or web address].

## 2023-07-31 NOTE — Plan of Care (Signed)

## 2023-07-31 NOTE — Evaluation (Signed)
Physical Therapy Evaluation Patient Details Name: [name] MRN: [id number] DOB: Oct 07, 1940 Today's Date: 07/31/2023  History of Present Illness  Patient is a 82 y.o. female with medical history significant for CKD 4, anemia of CKD, diastolic CHF, HTN, surgical hypothyroidism, gout COPD, OSA on CPAP, diagnosed with A-fib a month ago while hospitalized (8/30 to 9/2) for CHF exacerbation. Current MD assessment: a-fib with RVR, acute on chronic CHF, acute renal failure, traumatic hematoma on RLE, and hypokalemia.  Clinical Impression  Pt was pleasant and motivated to participate during the session and put forth good effort throughout. Pt is able to perform bed mobility Mod I. Pt is CGA for STS transfers and amb with RW. Once sitting EOB pt endorses dizziness, with HR and SpO2 remaining WFL. Dizziness quickly resolved with ~30 sec of rest seated EOB. She was educated on step-to gait pattern in order to minimize RLE pain when taking steps. Pt able to perform x2 STS's, upon second stand, able to take steps forwards, backwards, and to the side for a total of ~6 feet, with consistent VC's for sequencing and RW management to achieve step-to gait pattern. Pt will benefit from continued PT services upon discharge to safely address deficits listed in patient problem list for decreased caregiver assistance and eventual return to PLOF.       If plan is discharge home, recommend the following: Assist for transportation;A little help with walking and/or transfers;A little help with bathing/dressing/bathroom;Assistance with cooking/housework;Help with stairs or ramp for entrance   Can travel by private vehicle   Yes    Equipment Recommendations None recommended by PT  Recommendations for Other Services       Functional Status Assessment Patient has had a recent decline in their functional status and demonstrates the ability to make significant improvements in function in a reasonable and predictable  amount of time.     Precautions / Restrictions Precautions Precautions: Fall Restrictions Weight Bearing Restrictions: No      Mobility  Bed Mobility Overal bed mobility: Modified Independent Bed Mobility: Supine to Sit, Sit to Supine     Supine to sit: Used rails Sit to supine: Used rails   General bed mobility comments: Mod I with use of rails. Upon sitting EOB, pt endorses dizziness, HR and SpO2 were WFL.    Transfers Overall transfer level: Needs assistance Equipment used: Rolling walker (2 wheels) Transfers: Sit to/from Stand Sit to Stand: Contact guard assist           General transfer comment: Performed x2 STS's, very slow to ascend from lowered surface but needs no physical assist    Ambulation/Gait Ambulation/Gait assistance: Contact guard assist Gait Distance (Feet): 6 Feet Assistive device: Rolling walker (2 wheels) Gait Pattern/deviations: Step-to pattern, Decreased step length - right, Decreased step length - left, Decreased stride length, Decreased weight shift to right, Decreased stance time - right Gait velocity: decreased     General Gait Details: Pt able to take a few steps forwards, backwards, and to the side. With cues for step-to pattern in order to minimize RLE pain  Stairs            Wheelchair Mobility     Tilt Bed    Modified Rankin (Stroke Patients Only)       Balance Overall balance assessment: Needs assistance   Sitting balance-Leahy Scale: Normal       Standing balance-Leahy Scale: Fair Standing balance comment: Static with RW, slight constant lean to L side  Pertinent Vitals/Pain Pain Assessment Pain Assessment: 0-10 Pain Score: 5  Pain Location: RLE, from knee down Pain Descriptors / Indicators: Sore, Tender Pain Intervention(s): Monitored during session, Limited activity within patient's tolerance    Home Living Family/patient expects to be discharged to:: Private  residence Living Arrangements: Spouse/significant other Available Help at Discharge: Family Type of Home: House Home Access: Stairs to enter Entrance Stairs-Rails: None Entrance Stairs-Number of Steps: 1 Alternate Level Stairs-Number of Steps: Flight Home Layout: Two level;Laundry or work area in basement;Able to live on main level with bedroom/bathroom Home Equipment: Agricultural consultant (2 wheels);BSC/3in1;Tub bench;Grab bars - tub/shower;Wheelchair - manual Additional Comments: doe not access stairs in home regularly    Prior Function Prior Level of Function : Independent/Modified Independent             Mobility Comments: Limited community amb with RW, endorses recent fall where she hit her LRE on the truck (reason of admission). Pt reports being on and off with PT services at home. ADLs Comments: Pt reports Ind with showering and bathroom use. Notes her niece and husband drive her to doctors appts and grocery stores     Extremity/Trunk Assessment   Upper Extremity Assessment Upper Extremity Assessment: Overall WFL for tasks assessed    Lower Extremity Assessment Lower Extremity Assessment: Generalized weakness;RLE deficits/detail RLE Deficits / Details: Pain limited, Below knee is heavily bruised with slight swelling       Communication   Communication Communication: No apparent difficulties  Cognition Arousal: Alert Behavior During Therapy: WFL for tasks assessed/performed Overall Cognitive Status: Within Functional Limits for tasks assessed                                          General Comments      Exercises Other Exercises Other Exercises: Pt educated on Step to gait pattern in order to minimize pain when WB'ing through RLE. Pt verbalized understanding   Assessment/Plan    PT Assessment Patient needs continued PT services  PT Problem List Decreased strength;Decreased coordination;Decreased range of motion;Decreased activity  tolerance;Decreased balance;Decreased mobility       PT Treatment Interventions DME instruction;Balance training;Gait training;Stair training;Functional mobility training;Therapeutic activities;Therapeutic exercise    PT Goals (Current goals can be found in the Care Plan section)  Acute Rehab PT Goals Patient Stated Goal: get back to cooking PT Goal Formulation: With patient Time For Goal Achievement: 08/13/23 Potential to Achieve Goals: Good    Frequency Min 1X/week     Co-evaluation               AM-PAC PT "6 Clicks" Mobility  Outcome Measure Help needed turning from your back to your side while in a flat bed without using bedrails?: A Little Help needed moving from lying on your back to sitting on the side of a flat bed without using bedrails?: A Little Help needed moving to and from a bed to a chair (including a wheelchair)?: A Little Help needed standing up from a chair using your arms (e.g., wheelchair or bedside chair)?: A Little Help needed to walk in hospital room?: A Little Help needed climbing 3-5 steps with a railing? : A Little 6 Click Score: 18    End of Session Equipment Utilized During Treatment: Gait belt Activity Tolerance: Patient tolerated treatment well;Patient limited by pain Patient left: in bed;with call bell/phone within reach;with bed alarm set Nurse Communication: Mobility  status PT Visit Diagnosis: Other abnormalities of gait and mobility (R26.89);Difficulty in walking, not elsewhere classified (R26.2);Unsteadiness on feet (R26.81)    Time: 8756-4332 PT Time Calculation (min) (ACUTE ONLY): 28 min   Charges:   PT Evaluation $PT Eval Moderate Complexity: 1 Mod PT Treatments $Gait Training: 8-22 mins PT General Charges $$ ACUTE PT VISIT: 1 Visit         Cecile Sheerer, SPT 07/31/23, 1:00 PM

## 2023-08-01 DIAGNOSIS — S8011XD Contusion of right lower leg, subsequent encounter: Secondary | ICD-10-CM

## 2023-08-01 DIAGNOSIS — S8011XA Contusion of right lower leg, initial encounter: Secondary | ICD-10-CM | POA: Diagnosis not present

## 2023-08-01 DIAGNOSIS — L089 Local infection of the skin and subcutaneous tissue, unspecified: Secondary | ICD-10-CM | POA: Diagnosis not present

## 2023-08-01 LAB — BASIC METABOLIC PANEL
Anion gap: 14 (ref 5–15)
BUN: 99 mg/dL — ABNORMAL HIGH (ref 8–23)
CO2: 21 mmol/L — ABNORMAL LOW (ref 22–32)
Calcium: 9.7 mg/dL (ref 8.9–10.3)
Chloride: 99 mmol/L (ref 98–111)
Creatinine, Ser: 4.35 mg/dL — ABNORMAL HIGH (ref 0.44–1.00)
GFR, Estimated: 10 mL/min — ABNORMAL LOW (ref >60)
Glucose, Bld: 97 mg/dL (ref 70–99)
Potassium: 3.9 mmol/L (ref 3.5–5.1)
Sodium: 134 mmol/L — ABNORMAL LOW (ref 135–145)

## 2023-08-01 LAB — CBC WITH DIFFERENTIAL/PLATELET
Abs Immature Granulocytes: 0.03 10*3/uL (ref 0.00–0.07)
Basophils Absolute: 0.1 10*3/uL (ref 0.0–0.1)
Basophils Relative: 1 %
Eosinophils Absolute: 0.4 10*3/uL (ref 0.0–0.5)
Eosinophils Relative: 4 %
HCT: 27.9 % — ABNORMAL LOW (ref 36.0–46.0)
Hemoglobin: 8.9 g/dL — ABNORMAL LOW (ref 12.0–15.0)
Immature Granulocytes: 0 %
Lymphocytes Relative: 38 %
Lymphs Abs: 3.2 10*3/uL (ref 0.7–4.0)
MCH: 27.1 pg (ref 26.0–34.0)
MCHC: 31.9 g/dL (ref 30.0–36.0)
MCV: 84.8 fL (ref 80.0–100.0)
Monocytes Absolute: 0.8 10*3/uL (ref 0.1–1.0)
Monocytes Relative: 10 %
Neutro Abs: 4 10*3/uL (ref 1.7–7.7)
Neutrophils Relative %: 47 %
Platelets: 245 10*3/uL (ref 150–400)
RBC: 3.29 MIL/uL — ABNORMAL LOW (ref 3.87–5.11)
RDW: 16.1 % — ABNORMAL HIGH (ref 11.5–15.5)
WBC: 8.5 10*3/uL (ref 4.0–10.5)
nRBC: 0 % (ref 0.0–0.2)

## 2023-08-01 MED ORDER — HYDROCODONE-ACETAMINOPHEN 5-325 MG PO TABS
1.0000 | ORAL_TABLET | ORAL | Status: DC | PRN
  Administered 2023-08-01 – 2023-08-02 (×4): 2 via ORAL
  Filled 2023-08-01 (×4): qty 2

## 2023-08-01 NOTE — Progress Notes (Signed)
PROGRESS NOTE    [name]   ZOX:[id number] DOB: [birth year]  DOA: 07/29/2023 Date of Service: 08/01/23 which is hospital day 2  PCP: Lauro Regulus, MD    HPI: [name] is a 82 y.o. female with extensive chronic medical issues that include but are not limited to paroxysmal atrial fibrillation on chronic anticoagulation with Eliquis, CHF, COPD, chronic kidney disease, chronic anemia, etc.  She presents 07/30/23 to ED from home  by EMS for evaluation of rapid heart rate/palpitations and some shortness of breath. Of note she states that about 8 days ago she struck the right lower leg on the outside of her truck and still has a large bruise or blood clot on the side of her leg that she has kept wrapped. She has not had any fever and is able to ambulate but it is very painful for her.   Hospital course / significant events:  10/28: to ED, tachcyardic to 126, Cr up to 4.57 (baseline 2.3), EKG (+)Afib rate 140, then s/p metoprolol converted to NSR. Concern for infected leg hematoma, admitted to hospitalist service. Gen surg consult - no significant concern for infection, no plan for procedures, reasonable to complete abx for possible cellulitis. Nephrolgy consulted 10/29: general surgery advising at least another day monitoring leg though it's looking better today. Holding furosmide.  10/30: leg looking worse, NPO p mn tonight and possible surgical intervention tomorrow. Renal function minimal improvement   Consultants:  General Surgery  Nephrology   Procedures/Surgeries: none      ASSESSMENT & PLAN:   Atrial fibrillation with rapid ventricular response (HCC) Converted to sinus with IV metoprolol in the ED Continue oral metoprolol Continue apixaban  Acute Kidney Injury on chronic kidney disease stage IV with baseline creatinine 2.33 and GFR of 20 on 06/03/23. Likely d/t cardiorenal syndrome  Nephrology following  Ok to continue furosemide 40 mg po  bid  Hypokalemia Replace as needed Monitor BMP   Traumatic hematoma of lower leg without deep infection but with possible mild cellulitis, right, initial encounter Abx continue w/ ceftriaxone   Chronic diastolic congestive heart failure  No exacerbation - SOB likely d/t AFib RVR EF 60 to 65% 04/2023. Patient short of breath, BNP 293, chest x-ray showing cardiomegaly Continue beta blocker Holding diuretics given renal function  Daily weights with intake and output monitoring   COPD (chronic obstructive pulmonary disease) (HCC) Not acutely exacerbated Continue home inhalers with DuoNebs as needed   OSA on CPAP CPAP nightly   Hypothyroidism Continue levothyroxine 150 mcg  Elevated troponin Troponin bump from 79->157 Suspect demand ischemia from rapid A-fib Patient denies chest pain and EKG with no ischemic changes Monitor for chest pain, no intervention at this time   obesity based on BMI: Body mass index is 30.48 kg/m.  Underweight - under 18.5  normal weight - 18.5 to 24.9 overweight - 25 to 29.9 obese - 30 or more   DVT prophylaxis: holding pending possible surgery, cannot reasonable do SCD given leg injury / pain IV fluids: no continuous IV fluids  Nutrition: cardiac diet  Central lines / invasive devices: none  Code Status: FULL CODE ACP documentation reviewed:  none on file in [facility]  TOC needs: will need SNF rehab  Barriers to dispo / significant pending items: possible surgery tomorrow              Subjective / Brief ROS:  Patient reports pain in RLE, feels due for some meds for this Denies CP/SOB.  Denies new weakness.  Tolerating diet.  Reports no concerns w/ urination/defecation.   Family Communication: husband at bedside on rounds     Objective Findings:  Vitals:   08/01/23 0334 08/01/23 0355 08/01/23 0730 08/01/23 1307  BP:  (!) 147/63 (!) 159/52 (!) 161/46  Pulse:  61 63 (!) 57  Resp:  18 20 20   Temp:  98.4 F (36.9 C) 98.3 F  (36.8 C) 97.6 F (36.4 C)  TempSrc:  Oral    SpO2:  96% 95% 96%  Weight: 75.6 kg     Height:        Intake/Output Summary (Last 24 hours) at 08/01/2023 1449 Last data filed at 08/01/2023 1447 Gross per 24 hour  Intake 720 ml  Output --  Net 720 ml   Filed Weights   07/29/23 2319 07/31/23 0500 08/01/23 0334  Weight: 70.7 kg 72 kg 75.6 kg    Examination:  Physical Exam Constitutional:      General: She is not in acute distress. Cardiovascular:     Rate and Rhythm: Normal rate and regular rhythm.  Pulmonary:     Effort: Pulmonary effort is normal.  Musculoskeletal:     Right lower leg: Edema present.     Left lower leg: Edema present.  Skin:    Findings: Ecchymosis (tender, some fluctuance lateral RLE) present.  Neurological:     General: No focal deficit present.     Mental Status: She is alert and oriented to person, place, and time.  Psychiatric:        Mood and Affect: Mood normal.        Behavior: Behavior normal.          Scheduled Medications:   levothyroxine  150 mcg Oral Daily   metoprolol tartrate  25 mg Oral BID   potassium chloride  10 mEq Oral Daily    Continuous Infusions:  cefTRIAXone (ROCEPHIN)  IV 1 g (07/31/23 2046)    PRN Medications:  acetaminophen **OR** acetaminophen, HYDROcodone-acetaminophen, ipratropium-albuterol, ondansetron **OR** ondansetron (ZOFRAN) IV  Antimicrobials from admission:  Anti-infectives (From admission, onward)    Start     Dose/Rate Route Frequency Ordered Stop   07/30/23 2200  cefTRIAXone (ROCEPHIN) 1 g in sodium chloride 0.9 % 100 mL IVPB        1 g 200 mL/hr over 30 Minutes Intravenous Every 24 hours 07/30/23 0249     07/30/23 0115  cefTRIAXone (ROCEPHIN) 1 g in sodium chloride 0.9 % 100 mL IVPB        1 g 200 mL/hr over 30 Minutes Intravenous STAT 07/30/23 0109 07/30/23 0252           Data Reviewed:  I have personally reviewed the following...  CBC: Recent Labs  Lab 07/29/23 2324  07/31/23 0411 08/01/23 0544  WBC 8.9 6.9 8.5  NEUTROABS 6.2 3.6 4.0  HGB 8.5* 8.5* 8.9*  HCT 26.1* 26.5* 27.9*  MCV 84.7 86.0 84.8  PLT 204 204 245   Basic Metabolic Panel: Recent Labs  Lab 07/29/23 2324 07/30/23 1657 07/31/23 0411 08/01/23 0544  NA 135 138 135 134*  K 2.9* 3.6 3.6 3.9  CL 100 103 99 99  CO2 21* 22 24 21*  GLUCOSE 106* 93 86 97  BUN 95* 105* 104* 99*  CREATININE 4.57* 4.28* 4.54* 4.35*  CALCIUM 10.4* 10.2 10.1 9.7  MG 1.7  --   --   --    GFR: Estimated Creatinine Clearance: 9.5 mL/min (A) (by C-G formula based on  SCr of 4.35 mg/dL (H)). Liver Function Tests: Recent Labs  Lab 07/29/23 2324  AST 30  ALT 19  ALKPHOS 84  BILITOT 0.9  PROT 7.8  ALBUMIN 3.4*   No results for input(s): "LIPASE", "AMYLASE" in the last 168 hours. No results for input(s): "AMMONIA" in the last 168 hours. Coagulation Profile: Recent Labs  Lab 07/29/23 2324  INR 1.6*   Cardiac Enzymes: No results for input(s): "CKTOTAL", "CKMB", "CKMBINDEX", "TROPONINI" in the last 168 hours. BNP (last 3 results) No results for input(s): "PROBNP" in the last 8760 hours. HbA1C: No results for input(s): "HGBA1C" in the last 72 hours. CBG: No results for input(s): "GLUCAP" in the last 168 hours. Lipid Profile: No results for input(s): "CHOL", "HDL", "LDLCALC", "TRIG", "CHOLHDL", "LDLDIRECT" in the last 72 hours. Thyroid Function Tests: No results for input(s): "TSH", "T4TOTAL", "FREET4", "T3FREE", "THYROIDAB" in the last 72 hours. Anemia Panel: No results for input(s): "VITAMINB12", "FOLATE", "FERRITIN", "TIBC", "IRON", "RETICCTPCT" in the last 72 hours. Most Recent Urinalysis On File:     Component Value Date/Time   COLORURINE YELLOW (A) 04/18/2023 1327   APPEARANCEUR TURBID (A) 04/18/2023 1327   APPEARANCEUR Clear 09/17/2018 1028   LABSPEC 1.010 04/18/2023 1327   LABSPEC 1.013 06/20/2012 1508   PHURINE 7.0 04/18/2023 1327   GLUCOSEU NEGATIVE 04/18/2023 1327   GLUCOSEU  Negative 06/20/2012 1508   HGBUR SMALL (A) 04/18/2023 1327   BILIRUBINUR NEGATIVE 04/18/2023 1327   BILIRUBINUR Negative 09/17/2018 1028   BILIRUBINUR Negative 06/20/2012 1508   KETONESUR NEGATIVE 04/18/2023 1327   PROTEINUR 100 (A) 04/18/2023 1327   NITRITE NEGATIVE 04/18/2023 1327   LEUKOCYTESUR LARGE (A) 04/18/2023 1327   LEUKOCYTESUR Negative 06/20/2012 1508   Sepsis Labs: @LABRCNTIP (procalcitonin:4,lacticidven:4) Microbiology: No results found for this or any previous visit (from the past 240 hour(s)).    Radiology Studies last 3 days: DG Tibia/Fibula Right  Result Date: 07/30/2023 CLINICAL DATA:  Pain and swelling EXAM: RIGHT TIBIA AND FIBULA - 2 VIEW COMPARISON:  None Available. FINDINGS: No fracture or malalignment. Vascular calcifications. Protuberant soft tissue swelling over the mid to distal anterior lower leg. IMPRESSION: No acute osseous abnormality. Protuberant soft tissue swelling over the mid to distal anterior lower leg. Electronically Signed   By: Jasmine Pang M.D.   On: 07/30/2023 01:20   DG Chest Portable 1 View  Result Date: 07/30/2023 CLINICAL DATA:  Dyspnea EXAM: PORTABLE CHEST 1 VIEW COMPARISON:  06/01/2023 FINDINGS: Cardiomegaly. No acute airspace disease, pleural effusion, or pneumothorax. IMPRESSION: No active disease. Cardiomegaly. Electronically Signed   By: Jasmine Pang M.D.   On: 07/30/2023 00:29           Sunnie Nielsen, DO [facility] 08/01/2023, 2:49 PM    Dictation software may have been used to generate the above note. Typos may occur and escape review in typed/dictated notes. Please contact Dr Lyn Hollingshead directly for clarity if needed.  Staff may message me via secure chat in Epic  but this may not receive an immediate response,  please page me for urgent matters!  If 7PM-7AM, please contact night coverage [email or web address]

## 2023-08-01 NOTE — Progress Notes (Addendum)
Physical Therapy Treatment Patient Details Name: [name] MRN: [id number] DOB: [birth year] Today's Date: 08/01/2023   History of Present Illness Patient is a 82 y.o. female with medical history significant for CKD 4, anemia of CKD, diastolic CHF, HTN, surgical hypothyroidism, gout COPD, OSA on CPAP, diagnosed with A-fib a month ago while hospitalized (8/30 to 9/2) for CHF exacerbation. Current MD assessment: a-fib with RVR, acute on chronic CHF, acute renal failure, traumatic hematoma on RLE, and hypokalemia.    PT Comments  Pt was pleasant and motivated to participate during the session and put forth good effort throughout. Pt needing no physical assist with mobility performed today. She was able to perform x2 short ambulatory bouts in her room, initial bout was to [facility], after, pt able to amb ~15 feet with RW and step-to gait pattern, with self selected weight bearing on her R heel. Pt reports feeling better today and is making improvements in ambulatory distance compared to her prior session. Showing good carryover with step-to patterns, pt performed transfers and gait today with increasing confidence and ease compared to prior session. Pt will benefit from continued PT services upon discharge to safely address deficits listed in patient problem list for decreased caregiver assistance and eventual return to PLOF.      If plan is discharge home, recommend the following: Assist for transportation;A little help with walking and/or transfers;A little help with bathing/dressing/bathroom;Assistance with cooking/housework;Help with stairs or ramp for entrance   Can travel by private vehicle        Equipment Recommendations  None recommended by PT    Recommendations for Other Services       Precautions / Restrictions Precautions Precautions: Fall Restrictions Weight Bearing Restrictions: No     Mobility  Bed Mobility Overal bed mobility: Modified Independent Bed Mobility: Supine to  Sit, Sit to Supine     Supine to sit: Used rails Sit to supine: Used rails   General bed mobility comments: Continues to be mod I with light use of rails    Transfers Overall transfer level: Needs assistance Equipment used: Rolling walker (2 wheels) Transfers: Sit to/from Stand Sit to Stand: Contact guard assist           General transfer comment: Pt performed multiple STS's from variable heights and surfaces    Ambulation/Gait Ambulation/Gait assistance: Contact guard assist Gait Distance (Feet): 5 Feet x1, 15 feet x1 Assistive device: Rolling walker (2 wheels) Gait Pattern/deviations: Step-to pattern, Decreased step length - right, Decreased step length - left, Decreased stride length, Decreased weight shift to right, Decreased stance time - right Gait velocity: decreased     General Gait Details: Pt able to perform initial bout to [facility], steady steps using step-to pattern with self-selected weight bearing on her heel. no lOB or dizziness noted. HR and SpO2 remaining WFL.   Stairs             Wheelchair Mobility     Tilt Bed    Modified Rankin (Stroke Patients Only)       Balance Overall balance assessment: Needs assistance   Sitting balance-Leahy Scale: Normal       Standing balance-Leahy Scale: Fair Standing balance comment: Static standing with RW                            Cognition Arousal: Alert Behavior During Therapy: WFL for tasks assessed/performed Overall Cognitive Status: Within Functional Limits for tasks assessed  Exercises      General Comments        Pertinent Vitals/Pain Pain Assessment Pain Assessment: Faces Faces Pain Scale: Hurts little more Pain Location: RLE, from knee down Pain Descriptors / Indicators: Sore, Tender Pain Intervention(s): Monitored during session, Limited activity within patient's tolerance    Home Living                           Prior Function            PT Goals (current goals can now be found in the care plan section) Progress towards PT goals: Progressing toward goals    Frequency    Min 1X/week      PT Plan      Co-evaluation              AM-PAC PT "6 Clicks" Mobility   Outcome Measure  Help needed turning from your back to your side while in a flat bed without using bedrails?: A Little Help needed moving from lying on your back to sitting on the side of a flat bed without using bedrails?: A Little Help needed moving to and from a bed to a chair (including a wheelchair)?: A Little Help needed standing up from a chair using your arms (e.g., wheelchair or bedside chair)?: A Little Help needed to walk in hospital room?: A Little Help needed climbing 3-5 steps with a railing? : A Little 6 Click Score: 18    End of Session Equipment Utilized During Treatment: Gait belt Activity Tolerance: Patient tolerated treatment well;Patient limited by pain Patient left: in chair;with call bell/phone within reach;with chair alarm set Nurse Communication: Mobility status PT Visit Diagnosis: Other abnormalities of gait and mobility (R26.89);Difficulty in walking, not elsewhere classified (R26.2);Unsteadiness on feet (R26.81)     Time: 5366-4403 PT Time Calculation (min) (ACUTE ONLY): 23 min  Charges:                            Cecile Sheerer, SPT 08/01/23, 11:14 AM   This entire session was performed under direct supervision and direction of a licensed therapist/therapist assistant. I have personally read, edited and approve of the note as written.  Loran Senters, DPT

## 2023-08-01 NOTE — Progress Notes (Signed)
CC: hematoma Subjective: Doing ok, leg still hurting some Objective: Vital signs in last 24 hours: Temp:  [97.8 F (36.6 C)-98.6 F (37 C)] 98.3 F (36.8 C) (10/30 0730) Pulse Rate:  [57-64] 63 (10/30 0730) Resp:  [18-20] 20 (10/30 0730) BP: (143-164)/(49-63) 159/52 (10/30 0730) SpO2:  [95 %-97 %] 95 % (10/30 0730) Weight:  [75.6 kg] 75.6 kg (10/30 0334) Last BM Date : 07/31/23  Intake/Output from previous day: No intake/output data recorded. Intake/Output this shift: Total I/O In: 240 [P.O.:240] Out: -   Physical exam:  Physical Exam:  Constitutional: alert, cooperative and no distress  HENT: normocephalic without obvious abnormality  Respiratory: breathing non-labored at rest  Integumentary / Musculoskeletal: large hematoma to the right lower extremity with ecchymosis extending from the proximal calf/shin to the mid-foot,  it might be more tense that 2 days ago, No evidence of expansion and her foot is warm and well perfused, no evidence of necrotizing infection  Lab Results: CBC  Recent Labs    07/31/23 0411 08/01/23 0544  WBC 6.9 8.5  HGB 8.5* 8.9*  HCT 26.5* 27.9*  PLT 204 245   BMET Recent Labs    07/31/23 0411 08/01/23 0544  NA 135 134*  K 3.6 3.9  CL 99 99  CO2 24 21*  GLUCOSE 86 97  BUN 104* 99*  CREATININE 4.54* 4.35*  CALCIUM 10.1 9.7   PT/INR Recent Labs    07/29/23 2324  LABPROT 18.8*  INR 1.6*   ABG No results for input(s): "PHART", "HCO3" in the last 72 hours.  Invalid input(s): "PCO2", "PO2"  Studies/Results: No results found.  Anti-infectives: Anti-infectives (From admission, onward)    Start     Dose/Rate Route Frequency Ordered Stop   07/30/23 2200  cefTRIAXone (ROCEPHIN) 1 g in sodium chloride 0.9 % 100 mL IVPB        1 g 200 mL/hr over 30 Minutes Intravenous Every 24 hours 07/30/23 0249     07/30/23 0115  cefTRIAXone (ROCEPHIN) 1 g in sodium chloride 0.9 % 100 mL IVPB        1 g 200 mL/hr over 30 Minutes Intravenous  STAT 07/30/23 0109 07/30/23 0252       Assessment/Plan: 82 yo right leg hematoma.  Today seems more tense.  We will watch it.  Definitely not ready for discharge. Precaution we will keep her n.p.o. and reassess tomorrow.  She may need formal I&D of the leg hematoma.  Currently there is no evidence of compartment syndrome and the leg is well-perfused.  Please note I spent 35 minutes in this encounter including personally reviewing medical records, coordinating her care, placing orders and performing documentation   Sterling Big, MD, FACS  08/01/2023

## 2023-08-01 NOTE — Progress Notes (Signed)
[facility]  ROUNDING NOTE   Subjective:   [name] IS A 82 y.o. female with past medical conditions including diastolic heart failure, hypertension, hypothyroidism, COPD, and chronic kidney disease stage IV. Patient reports to ED with RLE pain. She has been admitted for Hypokalemia [E87.6] Elevated troponin level [R79.89] Demand ischemia (HCC) [I24.89] Chronic anemia [D64.9] Atrial fibrillation with rapid ventricular response (HCC) [I48.91] Atrial fibrillation with RVR (HCC) [I48.91] Current use of long term anticoagulation [Z79.01] Traumatic hematoma of lower leg with infection, right, subsequent encounter [S80.11XD, L08.9] Acute renal failure superimposed on chronic kidney disease, unspecified acute renal failure type, unspecified CKD stage (HCC) [N17.9, N18.9]  Patient is known to our practice and is followed by Dr Cherylann Ratel. He was last seen in office on 07/19/23.   Patient seen laying in bed Alert States she feels tired today  PT at bedside awaiting therapy session  Creatinine 4.35  Objective:  Vital signs in last 24 hours:  Temp:  [97.7 F (36.5 C)-98.6 F (37 C)] 98.3 F (36.8 C) (10/30 0730) Pulse Rate:  [57-67] 63 (10/30 0730) Resp:  [16-20] 20 (10/30 0730) BP: (142-164)/(49-63) 159/52 (10/30 0730) SpO2:  [95 %-98 %] 95 % (10/30 0730) Weight:  [75.6 kg] 75.6 kg (10/30 0334)  Weight change: 3.6 kg Filed Weights   07/29/23 2319 07/31/23 0500 08/01/23 0334  Weight: 70.7 kg 72 kg 75.6 kg    Intake/Output: No intake/output data recorded.   Intake/Output this shift:  Total I/O In: 240 [P.O.:240] Out: -   Physical Exam: General: NAD  Head: Normocephalic, atraumatic. Moist oral mucosal membranes  Eyes: Anicteric  Lungs:  Clear to auscultation, room air  Heart: Regular rate and rhythm  Abdomen:  Soft, nontender  Extremities:  trace peripheral edema.RLE bruising  Neurologic: Nonfocal, moving all four extremities  Skin: RLE abrasions, edema,  warm       Basic Metabolic Panel: Recent Labs  Lab 07/29/23 2324 07/30/23 1657 07/31/23 0411 08/01/23 0544  NA 135 138 135 134*  K 2.9* 3.6 3.6 3.9  CL 100 103 99 99  CO2 21* 22 24 21*  GLUCOSE 106* 93 86 97  BUN 95* 105* 104* 99*  CREATININE 4.57* 4.28* 4.54* 4.35*  CALCIUM 10.4* 10.2 10.1 9.7  MG 1.7  --   --   --     Liver Function Tests: Recent Labs  Lab 07/29/23 2324  AST 30  ALT 19  ALKPHOS 84  BILITOT 0.9  PROT 7.8  ALBUMIN 3.4*   No results for input(s): "LIPASE", "AMYLASE" in the last 168 hours. No results for input(s): "AMMONIA" in the last 168 hours.  CBC: Recent Labs  Lab 07/29/23 2324 07/31/23 0411 08/01/23 0544  WBC 8.9 6.9 8.5  NEUTROABS 6.2 3.6 4.0  HGB 8.5* 8.5* 8.9*  HCT 26.1* 26.5* 27.9*  MCV 84.7 86.0 84.8  PLT 204 204 245    Cardiac Enzymes: No results for input(s): "CKTOTAL", "CKMB", "CKMBINDEX", "TROPONINI" in the last 168 hours.  BNP: Invalid input(s): "POCBNP"  CBG: No results for input(s): "GLUCAP" in the last 168 hours.  Microbiology: Results for orders placed or performed during the hospital encounter of 04/16/23  SARS Coronavirus 2 by RT PCR (hospital order, performed in [facility] hospital lab) *cepheid single result test* Anterior Nasal Swab     Status: None   Collection Time: 04/16/23  2:10 PM   Specimen: Anterior Nasal Swab  Result Value Ref Range Status   SARS Coronavirus 2 by RT PCR NEGATIVE  NEGATIVE Final    Comment: (NOTE) SARS-CoV-2 target nucleic acids are NOT DETECTED.  The SARS-CoV-2 RNA is generally detectable in upper and lower respiratory specimens during the acute phase of infection. The lowest concentration of SARS-CoV-2 viral copies this assay can detect is 250 copies / mL. A negative result does not preclude SARS-CoV-2 infection and should not be used as the sole basis for treatment or other patient management decisions.  A negative result may occur with improper specimen collection / handling,  submission of specimen other than nasopharyngeal swab, presence of viral mutation(s) within the areas targeted by this assay, and inadequate number of viral copies (<250 copies / mL). A negative result must be combined with clinical observations, patient history, and epidemiological information.  Fact Sheet for Patients:   RoadLapTop.co.za  Fact Sheet for Healthcare Providers: http://kim-miller.com/  This test is not yet approved or  cleared by the [place] FDA and has been authorized for detection and/or diagnosis of SARS-CoV-2 by FDA under an Emergency Use Authorization (EUA).  This EUA will remain in effect (meaning this test can be used) for the duration of the COVID-19 declaration under Section 564(b)(1) of the Act, 21 U.S.C. section 360bbb-3(b)(1), unless the authorization is terminated or revoked sooner.  Performed at [facility], [address] [place], [place] [address]   Respiratory (~20 pathogens) panel by PCR     Status: None   Collection Time: 04/18/23  1:27 PM   Specimen: Nasopharyngeal Swab; Respiratory  Result Value Ref Range Status   Adenovirus NOT DETECTED NOT DETECTED Final   Coronavirus 229E NOT DETECTED NOT DETECTED Final    Comment: (NOTE) The Coronavirus on the Respiratory Panel, DOES NOT test for the novel  Coronavirus (2019 nCoV)    Coronavirus HKU1 NOT DETECTED NOT DETECTED Final   Coronavirus NL63 NOT DETECTED NOT DETECTED Final   Coronavirus OC43 NOT DETECTED NOT DETECTED Final   Metapneumovirus NOT DETECTED NOT DETECTED Final   Rhinovirus / Enterovirus NOT DETECTED NOT DETECTED Final   Influenza A NOT DETECTED NOT DETECTED Final   Influenza B NOT DETECTED NOT DETECTED Final   Parainfluenza Virus 1 NOT DETECTED NOT DETECTED Final   Parainfluenza Virus 2 NOT DETECTED NOT DETECTED Final   Parainfluenza Virus 3 NOT DETECTED NOT DETECTED Final   Parainfluenza Virus 4 NOT DETECTED NOT  DETECTED Final   Respiratory Syncytial Virus NOT DETECTED NOT DETECTED Final   Bordetella pertussis NOT DETECTED NOT DETECTED Final   Bordetella Parapertussis NOT DETECTED NOT DETECTED Final   Chlamydophila pneumoniae NOT DETECTED NOT DETECTED Final   Mycoplasma pneumoniae NOT DETECTED NOT DETECTED Final    Comment: Performed at [facility], [address], [place], [place] [address]  Urine Culture (for pregnant, neutropenic or urologic patients or patients with an indwelling urinary catheter)     Status: Abnormal   Collection Time: 04/18/23  1:28 PM   Specimen: Urine, Clean Catch  Result Value Ref Range Status   Specimen Description   Final    URINE, CLEAN CATCH Performed at [facility] [address], [place], [place] [address]    Special Requests   Final    NONE Performed at [facility], [address] [place], [place] [address]    Culture MULTIPLE SPECIES PRESENT, SUGGEST RECOLLECTION (A)  Final   Report Status 04/19/2023 FINAL  Final  Expectorated Sputum Assessment w Gram Stain, Rflx to Resp Cult     Status: None   Collection Time: 04/19/23  2:00 PM   Specimen: Sputum  Result Value Ref Range Status   Specimen Description SPUTUM  Final   Special Requests NONE  Final   Sputum evaluation   Final    THIS SPECIMEN IS ACCEPTABLE FOR SPUTUM CULTURE Performed at [facility], [address] [place], [place] [address]    Report Status 04/19/2023 FINAL  Final  Culture, Respiratory w Gram Stain     Status: None   Collection Time: 04/19/23  2:00 PM   Specimen: SPU  Result Value Ref Range Status   Specimen Description   Final    SPUTUM Performed at [facility], [address], [place], [place] [address]    Special Requests   Final    NONE Reflexed from [id number] Performed at [facility], [address] [place], [place] [address]    Gram Stain   Final    FEW SQUAMOUS EPITHELIAL CELLS PRESENT FEW WBC PRESENT,  PREDOMINANTLY MONONUCLEAR MODERATE GRAM POSITIVE COCCI IN PAIRS AND CHAINS MODERATE GRAM NEGATIVE RODS FEW YEAST    Culture   Final    MODERATE Normal respiratory flora-no Staph aureus or Pseudomonas seen Performed at [facility], [address], [place], [place] [address]    Report Status 04/23/2023 FINAL  Final    Coagulation Studies: Recent Labs    07/29/23 2324  LABPROT 18.8*  INR 1.6*    Urinalysis: No results for input(s): "COLORURINE", "LABSPEC", "PHURINE", "GLUCOSEU", "HGBUR", "BILIRUBINUR", "KETONESUR", "PROTEINUR", "UROBILINOGEN", "NITRITE", "LEUKOCYTESUR" in the last 72 hours.  Invalid input(s): "APPERANCEUR"    Imaging: No results found.   Medications:    cefTRIAXone (ROCEPHIN)  IV 1 g (07/31/23 2046)    levothyroxine  150 mcg Oral Daily   metoprolol tartrate  25 mg Oral BID   potassium chloride  10 mEq Oral Daily   acetaminophen **OR** acetaminophen, HYDROcodone-acetaminophen, ipratropium-albuterol, ondansetron **OR** ondansetron (ZOFRAN) IV  Assessment/ Plan:  [name] is a 82 y.o.  female with past medical conditions including diastolic heart failure, hypertension, hypothyroidism, COPD, and chronic kidney disease stage IV Patient reports to ED with RLE pain. She has been admitted for Hypokalemia [E87.6] Elevated troponin level [R79.89] Demand ischemia (HCC) [I24.89] Chronic anemia [D64.9] Atrial fibrillation with rapid ventricular response (HCC) [I48.91] Atrial fibrillation with RVR (HCC) [I48.91] Current use of long term anticoagulation [Z79.01] Traumatic hematoma of lower leg with infection, right, subsequent encounter [S80.11XD, L08.9] Acute renal failure superimposed on chronic kidney disease, unspecified acute renal failure type, unspecified CKD stage (HCC) [N17.9, N18.9]   Acute Kidney Injury on chronic kidney disease stage IV with baseline creatinine 2.33 and GFR of 20 on 06/03/23.  Acute kidney injury secondary to cardiorenal   syndrome and concurrent illness. Creatinine elevated to 4.57 on admission.  . Creatinine decreased today. Diuretics remain held. No acute indication for dialysis.   Lab Results  Component Value Date   CREATININE 4.35 (H) 08/01/2023   CREATININE 4.54 (H) 07/31/2023   CREATININE 4.28 (H) 07/30/2023    Intake/Output Summary (Last 24 hours) at 08/01/2023 1056 Last data filed at 08/01/2023 1053 Gross per 24 hour  Intake 240 ml  Output --  Net 240 ml    2. Anemia of chronic kidney disease Lab Results  Component Value Date   HGB 8.9 (L) 08/01/2023    Hgb borderline. Will monitor for now.   3. Hypokalemia, corrected  4. Secondary Hyperparathyroidism: with outpatient labs: PTH 7, phosphorus 4.7, calcium 12.4 on 07/19/23.   Lab Results  Component Value Date   CALCIUM 9.7 08/01/2023   PHOS 3.6 06/04/2023   Bone minerals within optimal range.   LOS: 2 [name] 10/30/202410:56 AM

## 2023-08-01 NOTE — Plan of Care (Signed)
  Problem: Education: Goal: Ability to demonstrate management of disease process will improve Outcome: Progressing Goal: Ability to verbalize understanding of medication therapies will improve Outcome: Progressing Goal: Individualized Educational Video(s) Outcome: Progressing   Problem: Activity: Goal: Capacity to carry out activities will improve Outcome: Progressing   Problem: Cardiac: Goal: Ability to achieve and maintain adequate cardiopulmonary perfusion will improve Outcome: Progressing   Problem: Education: Goal: Knowledge of General Education information will improve Description: Including pain rating scale, medication(s)/side effects and non-pharmacologic comfort measures Outcome: Progressing   Problem: Health Behavior/Discharge Planning: Goal: Ability to manage health-related needs will improve Outcome: Progressing   Problem: Clinical Measurements: Goal: Ability to maintain clinical measurements within normal limits will improve Outcome: Progressing Goal: Will remain free from infection Outcome: Progressing Goal: Diagnostic test results will improve Outcome: Progressing Goal: Respiratory complications will improve Outcome: Progressing Goal: Cardiovascular complication will be avoided Outcome: Progressing   Problem: Activity: Goal: Risk for activity intolerance will decrease Outcome: Progressing   Problem: Nutrition: Goal: Adequate nutrition will be maintained Outcome: Progressing   Problem: Coping: Goal: Level of anxiety will decrease Outcome: Progressing   Problem: Elimination: Goal: Will not experience complications related to bowel motility Outcome: Progressing Goal: Will not experience complications related to urinary retention Outcome: Progressing   Problem: Pain Management: Goal: General experience of comfort will improve Outcome: Progressing   Problem: Safety: Goal: Ability to remain free from injury will improve Outcome: Progressing    Problem: Skin Integrity: Goal: Risk for impaired skin integrity will decrease Outcome: Progressing

## 2023-08-01 NOTE — Hospital Course (Addendum)
HPI: [name] is a 82 y.o. female with extensive chronic medical issues that include but are not limited to paroxysmal atrial fibrillation on chronic anticoagulation with Eliquis, CHF, COPD, chronic kidney disease, chronic anemia, etc.  She presents 07/30/23 to ED from home  by EMS for evaluation of rapid heart rate/palpitations and some shortness of breath. Of note she states that about 8 days ago she struck the right lower leg on the outside of her truck and still has a large bruise or blood clot on the side of her leg that she has kept wrapped. She has not had any fever and is able to ambulate but it is very painful for her.   Hospital course / significant events:  10/28: to ED, tachcyardic to 126, Cr up to 4.57 (baseline 2.3), EKG (+)Afib rate 140, then s/p metoprolol converted to NSR. Concern for infected leg hematoma, admitted to hospitalist service. Gen surg consult - no significant concern for infection, no plan for procedures, reasonable to complete abx for possible cellulitis. Nephrolgy consulted 10/29: general surgery advising at least another day monitoring leg though it's looking better today. Holding furosmide.  10/30: leg looking worse, NPO p mn tonight and possible surgical intervention tomorrow. Renal function minimal improvement   Consultants:  General Surgery  Nephrology   Procedures/Surgeries: none      ASSESSMENT & PLAN:   Atrial fibrillation with rapid ventricular response (HCC) Converted to sinus with IV metoprolol in the ED Continue oral metoprolol Continue apixaban  Acute Kidney Injury on chronic kidney disease stage IV with baseline creatinine 2.33 and GFR of 20 on 06/03/23. Likely d/t cardiorenal syndrome  Nephrology following  Ok to continue furosemide 40 mg po bid  Hypokalemia Replace as needed Monitor BMP   Traumatic hematoma of lower leg without deep infection but with possible mild cellulitis, right, initial encounter Abx continue w/ ceftriaxone    Chronic diastolic congestive heart failure  No exacerbation - SOB likely d/t AFib RVR EF 60 to 65% 04/2023. Patient short of breath, BNP 293, chest x-ray showing cardiomegaly Continue beta blocker Holding diuretics given renal function  Daily weights with intake and output monitoring   COPD (chronic obstructive pulmonary disease) (HCC) Not acutely exacerbated Continue home inhalers with DuoNebs as needed   OSA on CPAP CPAP nightly   Hypothyroidism Continue levothyroxine 150 mcg  Elevated troponin Troponin bump from 79->157 Suspect demand ischemia from rapid A-fib Patient denies chest pain and EKG with no ischemic changes Monitor for chest pain, no intervention at this time   obesity based on BMI: Body mass index is 30.48 kg/m.  Underweight - under 18.5  normal weight - 18.5 to 24.9 overweight - 25 to 29.9 obese - 30 or more   DVT prophylaxis: holding pending possible surgery, cannot reasonable do SCD given leg injury / pain IV fluids: no continuous IV fluids  Nutrition: cardiac diet  Central lines / invasive devices: none  Code Status: FULL CODE ACP documentation reviewed:  none on file in [facility]  TOC needs: will need SNF rehab  Barriers to dispo / significant pending items: possible surgery tomorrow

## 2023-08-02 ENCOUNTER — Encounter
Admission: EM | Disposition: A | Discharge status: Home Health | Payer: Self-pay | Source: Home / Self Care | Attending: Osteopathic Medicine

## 2023-08-02 ENCOUNTER — Inpatient Hospital Stay: Payer: Medicare Other | Admitting: Anesthesiology

## 2023-08-02 ENCOUNTER — Other Ambulatory Visit: Payer: Self-pay

## 2023-08-02 DIAGNOSIS — S8011XD Contusion of right lower leg, subsequent encounter: Secondary | ICD-10-CM | POA: Diagnosis not present

## 2023-08-02 DIAGNOSIS — S8011XA Contusion of right lower leg, initial encounter: Secondary | ICD-10-CM | POA: Diagnosis not present

## 2023-08-02 DIAGNOSIS — L089 Local infection of the skin and subcutaneous tissue, unspecified: Secondary | ICD-10-CM | POA: Diagnosis not present

## 2023-08-02 HISTORY — PX: HEMATOMA EVACUATION: SHX5118

## 2023-08-02 LAB — BASIC METABOLIC PANEL
Anion gap: 13 (ref 5–15)
BUN: 98 mg/dL — ABNORMAL HIGH (ref 8–23)
CO2: 22 mmol/L (ref 22–32)
Calcium: 9.8 mg/dL (ref 8.9–10.3)
Chloride: 99 mmol/L (ref 98–111)
Creatinine, Ser: 3.86 mg/dL — ABNORMAL HIGH (ref 0.44–1.00)
GFR, Estimated: 11 mL/min — ABNORMAL LOW (ref >60)
Glucose, Bld: 95 mg/dL (ref 70–99)
Potassium: 4 mmol/L (ref 3.5–5.1)
Sodium: 134 mmol/L — ABNORMAL LOW (ref 135–145)

## 2023-08-02 LAB — CBC WITH DIFFERENTIAL/PLATELET
Abs Immature Granulocytes: 0.04 10*3/uL (ref 0.00–0.07)
Basophils Absolute: 0 10*3/uL (ref 0.0–0.1)
Basophils Relative: 0 %
Eosinophils Absolute: 0.1 10*3/uL (ref 0.0–0.5)
Eosinophils Relative: 2 %
HCT: 26.3 % — ABNORMAL LOW (ref 36.0–46.0)
Hemoglobin: 8.8 g/dL — ABNORMAL LOW (ref 12.0–15.0)
Immature Granulocytes: 1 %
Lymphocytes Relative: 35 %
Lymphs Abs: 2.6 10*3/uL (ref 0.7–4.0)
MCH: 27.7 pg (ref 26.0–34.0)
MCHC: 33.5 g/dL (ref 30.0–36.0)
MCV: 82.7 fL (ref 80.0–100.0)
Monocytes Absolute: 0.7 10*3/uL (ref 0.1–1.0)
Monocytes Relative: 9 %
Neutro Abs: 4 10*3/uL (ref 1.7–7.7)
Neutrophils Relative %: 53 %
Platelets: 230 10*3/uL (ref 150–400)
RBC: 3.18 MIL/uL — ABNORMAL LOW (ref 3.87–5.11)
RDW: 15.9 % — ABNORMAL HIGH (ref 11.5–15.5)
WBC: 7.6 10*3/uL (ref 4.0–10.5)
nRBC: 0 % (ref 0.0–0.2)

## 2023-08-02 LAB — GLUCOSE, CAPILLARY: Glucose-Capillary: 95 mg/dL (ref 70–99)

## 2023-08-02 SURGERY — EVACUATION HEMATOMA
Anesthesia: Monitor Anesthesia Care | Site: Leg Lower | Laterality: Right

## 2023-08-02 MED ORDER — LACTATED RINGERS IV SOLN: INTRAVENOUS | Status: DC | PRN

## 2023-08-02 MED ORDER — BUPIVACAINE-EPINEPHRINE (PF) 0.25% -1:200000 IJ SOLN
INTRAMUSCULAR | Status: AC
  Filled 2023-08-02: qty 30

## 2023-08-02 MED ORDER — KETAMINE HCL 50 MG/5ML IJ SOSY
PREFILLED_SYRINGE | INTRAMUSCULAR | Status: AC
  Filled 2023-08-02: qty 5

## 2023-08-02 MED ORDER — PHENYLEPHRINE HCL-NACL 20-0.9 MG/250ML-% IV SOLN
INTRAVENOUS | Status: AC
  Filled 2023-08-02: qty 250

## 2023-08-02 MED ORDER — DEXMEDETOMIDINE HCL IN NACL 80 MCG/20ML IV SOLN
INTRAVENOUS | Status: DC | PRN
Start: 2023-08-02 — End: 2023-08-02
  Administered 2023-08-02: 4 ug via INTRAVENOUS

## 2023-08-02 MED ORDER — OXYCODONE HCL 5 MG PO TABS: 5.0000 mg | ORAL_TABLET | Freq: Once | ORAL | Status: DC | PRN

## 2023-08-02 MED ORDER — ONDANSETRON HCL 4 MG/2ML IJ SOLN
INTRAMUSCULAR | Status: DC | PRN
  Administered 2023-08-02: 4 mg via INTRAVENOUS

## 2023-08-02 MED ORDER — PROPOFOL 10 MG/ML IV BOLUS
INTRAVENOUS | Status: AC
  Filled 2023-08-02: qty 20

## 2023-08-02 MED ORDER — 0.9 % SODIUM CHLORIDE (POUR BTL) OPTIME
TOPICAL | Status: DC | PRN
  Administered 2023-08-02: 1000 mL

## 2023-08-02 MED ORDER — ONDANSETRON HCL 4 MG/2ML IJ SOLN: 4.0000 mg | Freq: Once | INTRAMUSCULAR | Status: DC | PRN

## 2023-08-02 MED ORDER — MORPHINE SULFATE (PF) 2 MG/ML IV SOLN: 2.0000 mg | INTRAVENOUS | Status: DC | PRN

## 2023-08-02 MED ORDER — PROPOFOL 500 MG/50ML IV EMUL
INTRAVENOUS | Status: DC | PRN
  Administered 2023-08-02: 20 mg via INTRAVENOUS
  Administered 2023-08-02: 50 ug/kg/min via INTRAVENOUS
  Administered 2023-08-02: 50 mg via INTRAVENOUS
  Administered 2023-08-02: 10 mg via INTRAVENOUS
  Administered 2023-08-02: 50 mg via INTRAVENOUS

## 2023-08-02 MED ORDER — ACETAMINOPHEN 500 MG PO TABS
1000.0000 mg | ORAL_TABLET | Freq: Four times a day (QID) | ORAL | Status: DC
  Administered 2023-08-02 – 2023-08-06 (×13): 1000 mg via ORAL
  Filled 2023-08-02 (×15): qty 2

## 2023-08-02 MED ORDER — LIDOCAINE HCL (CARDIAC) PF 100 MG/5ML IV SOSY
PREFILLED_SYRINGE | INTRAVENOUS | Status: DC | PRN
  Administered 2023-08-02: 50 mg via INTRAVENOUS

## 2023-08-02 MED ORDER — EPHEDRINE SULFATE-NACL 50-0.9 MG/10ML-% IV SOSY
PREFILLED_SYRINGE | INTRAVENOUS | Status: DC | PRN
  Administered 2023-08-02 (×2): 5 mg via INTRAVENOUS

## 2023-08-02 MED ORDER — OXYCODONE HCL 5 MG/5ML PO SOLN
5.0000 mg | Freq: Once | ORAL | Status: DC | PRN
Start: 2023-08-02 — End: 2023-08-02

## 2023-08-02 MED ORDER — PHENYLEPHRINE HCL-NACL 20-0.9 MG/250ML-% IV SOLN
INTRAVENOUS | Status: DC | PRN
  Administered 2023-08-02: 30 ug/min via INTRAVENOUS

## 2023-08-02 MED ORDER — OXYCODONE HCL 5 MG PO TABS
5.0000 mg | ORAL_TABLET | ORAL | Status: DC | PRN
  Administered 2023-08-02 – 2023-08-03 (×2): 5 mg via ORAL
  Filled 2023-08-02 (×2): qty 1

## 2023-08-02 MED ORDER — PROPOFOL 10 MG/ML IV BOLUS
INTRAVENOUS | Status: AC
Start: 2023-08-02 — End: ?
  Filled 2023-08-02: qty 20

## 2023-08-02 MED ORDER — FENTANYL CITRATE (PF) 100 MCG/2ML IJ SOLN
INTRAMUSCULAR | Status: AC
  Filled 2023-08-02: qty 2

## 2023-08-02 MED ORDER — FENTANYL CITRATE (PF) 100 MCG/2ML IJ SOLN: 25.0000 ug | INTRAMUSCULAR | Status: DC | PRN

## 2023-08-02 MED ORDER — FENTANYL CITRATE (PF) 100 MCG/2ML IJ SOLN
INTRAMUSCULAR | Status: DC | PRN
  Administered 2023-08-02 (×4): 25 ug via INTRAVENOUS

## 2023-08-02 MED ORDER — KETAMINE HCL 50 MG/5ML IJ SOSY
PREFILLED_SYRINGE | INTRAMUSCULAR | Status: DC | PRN
Start: 2023-08-02 — End: 2023-08-02
  Administered 2023-08-02 (×4): 10 mg via INTRAVENOUS

## 2023-08-02 SURGICAL SUPPLY — 28 items
BLADE CLIPPER SURG (BLADE) ×1 IMPLANT
BLADE SURG 15 STRL LF DISP TIS (BLADE) ×1 IMPLANT
BLADE SURG 15 STRL SS (BLADE) ×1
BNDG CMPR 5X4 CHSV STRCH STRL (GAUZE/BANDAGES/DRESSINGS) ×1
BNDG COHESIVE 4X5 TAN STRL LF (GAUZE/BANDAGES/DRESSINGS) IMPLANT
BNDG GAUZE DERMACEA FLUFF 4 (GAUZE/BANDAGES/DRESSINGS) IMPLANT
BNDG GZE DERMACEA 4 6PLY (GAUZE/BANDAGES/DRESSINGS) ×2
DRAPE LAPAROTOMY TRNSV 106X77 (MISCELLANEOUS) ×1 IMPLANT
DRSG TELFA 3X8 NADH STRL (GAUZE/BANDAGES/DRESSINGS) IMPLANT
ELECT CAUTERY BLADE 6.4 (BLADE) ×1 IMPLANT
ELECT REM PT RETURN 9FT ADLT (ELECTROSURGICAL) ×1
ELECTRODE REM PT RTRN 9FT ADLT (ELECTROSURGICAL) ×1 IMPLANT
GAUZE 4X4 16PLY ~~LOC~~+RFID DBL (SPONGE) ×1 IMPLANT
GLOVE BIO SURGEON STRL SZ7 (GLOVE) ×1 IMPLANT
GOWN STRL REUS W/ TWL LRG LVL3 (GOWN DISPOSABLE) ×2 IMPLANT
GOWN STRL REUS W/TWL LRG LVL3 (GOWN DISPOSABLE) ×2
MANIFOLD NEPTUNE II (INSTRUMENTS) ×1 IMPLANT
NDL HYPO 22X1.5 SAFETY MO (MISCELLANEOUS) ×1 IMPLANT
NEEDLE HYPO 22X1.5 SAFETY MO (MISCELLANEOUS) ×1 IMPLANT
NS IRRIG 1000ML POUR BTL (IV SOLUTION) ×1 IMPLANT
PACK BASIN MINOR ARMC (MISCELLANEOUS) ×1 IMPLANT
SOL PREP PVP 2OZ (MISCELLANEOUS)
SOLUTION PREP PVP 2OZ (MISCELLANEOUS) ×1 IMPLANT
SPONGE T-LAP 18X18 ~~LOC~~+RFID (SPONGE) ×1 IMPLANT
SUT SILK 2 0SH CR/8 30 (SUTURE) IMPLANT
SYR 20ML LL LF (SYRINGE) ×1 IMPLANT
TRAP FLUID SMOKE EVACUATOR (MISCELLANEOUS) ×1 IMPLANT
WATER STERILE IRR 500ML POUR (IV SOLUTION) ×1 IMPLANT

## 2023-08-02 NOTE — Progress Notes (Signed)
[facility]  ROUNDING NOTE   Subjective:   [name] IS A 82 y.o. female with past medical conditions including diastolic heart failure, hypertension, hypothyroidism, COPD, and chronic kidney disease stage IV. Patient reports to ED with RLE pain. She has been admitted for Hypokalemia [E87.6] Elevated troponin level [R79.89] Demand ischemia (HCC) [I24.89] Chronic anemia [D64.9] Atrial fibrillation with rapid ventricular response (HCC) [I48.91] Atrial fibrillation with RVR (HCC) [I48.91] Current use of long term anticoagulation [Z79.01] Traumatic hematoma of lower leg with infection, right, subsequent encounter [S80.11XD, L08.9] Acute renal failure superimposed on chronic kidney disease, unspecified acute renal failure type, unspecified CKD stage (HCC) [N17.9, N18.9]  Husband at bedside.   Creatinine 3.96 (4.35)  No acute indication for dialysis at this time.   Hematoma evacuation scheduled for today.    Objective:  Vital signs in last 24 hours:  Temp:  [97.6 F (36.4 C)-98.5 F (36.9 C)] 97.7 F (36.5 C) (10/31 1036) Pulse Rate:  [56-61] 60 (10/31 1036) Resp:  [17-20] 17 (10/31 1036) BP: (147-167)/(39-71) 156/49 (10/31 1036) SpO2:  [95 %-97 %] 96 % (10/31 1036) Weight:  [78.2 kg] 78.2 kg (10/31 0406)  Weight change: 2.6 kg Filed Weights   07/31/23 0500 08/01/23 0334 08/02/23 0406  Weight: 72 kg 75.6 kg 78.2 kg    Intake/Output: I/O last 3 completed shifts: In: 960 [P.O.:960] Out: -    Intake/Output this shift:  No intake/output data recorded.  Physical Exam: General: NAD  Head: Normocephalic, atraumatic. Moist oral mucosal membranes  Eyes: Anicteric  Lungs:  Clear to auscultation, room air  Heart: Regular rate and rhythm  Abdomen:  Soft, nontender  Extremities:  trace peripheral edema.RLE bruising  Neurologic: Nonfocal, moving all four extremities  Skin: RLE abrasions, edema, warm       Basic Metabolic Panel: Recent Labs  Lab  07/29/23 2324 07/30/23 1657 07/31/23 0411 08/01/23 0544 08/02/23 0648  NA 135 138 135 134* 134*  K 2.9* 3.6 3.6 3.9 4.0  CL 100 103 99 99 99  CO2 21* 22 24 21* 22  GLUCOSE 106* 93 86 97 95  BUN 95* 105* 104* 99* 98*  CREATININE 4.57* 4.28* 4.54* 4.35* 3.86*  CALCIUM 10.4* 10.2 10.1 9.7 9.8  MG 1.7  --   --   --   --     Liver Function Tests: Recent Labs  Lab 07/29/23 2324  AST 30  ALT 19  ALKPHOS 84  BILITOT 0.9  PROT 7.8  ALBUMIN 3.4*   No results for input(s): "LIPASE", "AMYLASE" in the last 168 hours. No results for input(s): "AMMONIA" in the last 168 hours.  CBC: Recent Labs  Lab 07/29/23 2324 07/31/23 0411 08/01/23 0544 08/02/23 0648  WBC 8.9 6.9 8.5 7.6  NEUTROABS 6.2 3.6 4.0 4.0  HGB 8.5* 8.5* 8.9* 8.8*  HCT 26.1* 26.5* 27.9* 26.3*  MCV 84.7 86.0 84.8 82.7  PLT 204 204 245 230    Cardiac Enzymes: No results for input(s): "CKTOTAL", "CKMB", "CKMBINDEX", "TROPONINI" in the last 168 hours.  BNP: Invalid input(s): "POCBNP"  CBG: No results for input(s): "GLUCAP" in the last 168 hours.  Microbiology: Results for orders placed or performed during the hospital encounter of 04/16/23  SARS Coronavirus 2 by RT PCR (hospital order, performed in [facility] hospital lab) *cepheid single result test* Anterior Nasal Swab     Status: None   Collection Time: 04/16/23  2:10 PM   Specimen: Anterior Nasal Swab  Result Value Ref Range Status  SARS Coronavirus 2 by RT PCR NEGATIVE NEGATIVE Final    Comment: (NOTE) SARS-CoV-2 target nucleic acids are NOT DETECTED.  The SARS-CoV-2 RNA is generally detectable in upper and lower respiratory specimens during the acute phase of infection. The lowest concentration of SARS-CoV-2 viral copies this assay can detect is 250 copies / mL. A negative result does not preclude SARS-CoV-2 infection and should not be used as the sole basis for treatment or other patient management decisions.  A negative result may occur  with improper specimen collection / handling, submission of specimen other than nasopharyngeal swab, presence of viral mutation(s) within the areas targeted by this assay, and inadequate number of viral copies (<250 copies / mL). A negative result must be combined with clinical observations, patient history, and epidemiological information.  Fact Sheet for Patients:   RoadLapTop.co.za  Fact Sheet for Healthcare Providers: http://kim-miller.com/  This test is not yet approved or  cleared by the [place] FDA and has been authorized for detection and/or diagnosis of SARS-CoV-2 by FDA under an Emergency Use Authorization (EUA).  This EUA will remain in effect (meaning this test can be used) for the duration of the COVID-19 declaration under Section 564(b)(1) of the Act, 21 U.S.C. section 360bbb-3(b)(1), unless the authorization is terminated or revoked sooner.  Performed at [facility], [address] [place], [place] [address]   Respiratory (~20 pathogens) panel by PCR     Status: None   Collection Time: 04/18/23  1:27 PM   Specimen: Nasopharyngeal Swab; Respiratory  Result Value Ref Range Status   Adenovirus NOT DETECTED NOT DETECTED Final   Coronavirus 229E NOT DETECTED NOT DETECTED Final    Comment: (NOTE) The Coronavirus on the Respiratory Panel, DOES NOT test for the novel  Coronavirus (2019 nCoV)    Coronavirus HKU1 NOT DETECTED NOT DETECTED Final   Coronavirus NL63 NOT DETECTED NOT DETECTED Final   Coronavirus OC43 NOT DETECTED NOT DETECTED Final   Metapneumovirus NOT DETECTED NOT DETECTED Final   Rhinovirus / Enterovirus NOT DETECTED NOT DETECTED Final   Influenza A NOT DETECTED NOT DETECTED Final   Influenza B NOT DETECTED NOT DETECTED Final   Parainfluenza Virus 1 NOT DETECTED NOT DETECTED Final   Parainfluenza Virus 2 NOT DETECTED NOT DETECTED Final   Parainfluenza Virus 3 NOT DETECTED NOT DETECTED Final    Parainfluenza Virus 4 NOT DETECTED NOT DETECTED Final   Respiratory Syncytial Virus NOT DETECTED NOT DETECTED Final   Bordetella pertussis NOT DETECTED NOT DETECTED Final   Bordetella Parapertussis NOT DETECTED NOT DETECTED Final   Chlamydophila pneumoniae NOT DETECTED NOT DETECTED Final   Mycoplasma pneumoniae NOT DETECTED NOT DETECTED Final    Comment: Performed at [facility], [address], [place], [place] [address]  Urine Culture (for pregnant, neutropenic or urologic patients or patients with an indwelling urinary catheter)     Status: Abnormal   Collection Time: 04/18/23  1:28 PM   Specimen: Urine, Clean Catch  Result Value Ref Range Status   Specimen Description   Final    URINE, CLEAN CATCH Performed at [facility], [address] [place], [place] [address]    Special Requests   Final    NONE Performed at [facility], [address] [place], [place] [address]    Culture MULTIPLE SPECIES PRESENT, SUGGEST RECOLLECTION (A)  Final   Report Status 04/19/2023 FINAL  Final  Expectorated Sputum Assessment w Gram Stain, Rflx to Resp Cult  Status: None   Collection Time: 04/19/23  2:00 PM   Specimen: Sputum  Result Value Ref Range Status   Specimen Description SPUTUM  Final   Special Requests NONE  Final   Sputum evaluation   Final    THIS SPECIMEN IS ACCEPTABLE FOR SPUTUM CULTURE Performed at [facility], [address], [place], [place] [address]    Report Status 04/19/2023 FINAL  Final  Culture, Respiratory w Gram Stain     Status: None   Collection Time: 04/19/23  2:00 PM   Specimen: SPU  Result Value Ref Range Status   Specimen Description   Final    SPUTUM Performed at [facility], [address], [place], [place] [address]    Special Requests   Final    NONE Reflexed from [id number] Performed at [facility], [address] [place], [place] [address]    Gram Stain   Final    FEW SQUAMOUS EPITHELIAL  CELLS PRESENT FEW WBC PRESENT, PREDOMINANTLY MONONUCLEAR MODERATE GRAM POSITIVE COCCI IN PAIRS AND CHAINS MODERATE GRAM NEGATIVE RODS FEW YEAST    Culture   Final    MODERATE Normal respiratory flora-no Staph aureus or Pseudomonas seen Performed at [facility], [address] [place], [place] [address]    Report Status 04/23/2023 FINAL  Final    Coagulation Studies: No results for input(s): "LABPROT", "INR" in the last 72 hours.   Urinalysis: No results for input(s): "COLORURINE", "LABSPEC", "PHURINE", "GLUCOSEU", "HGBUR", "BILIRUBINUR", "KETONESUR", "PROTEINUR", "UROBILINOGEN", "NITRITE", "LEUKOCYTESUR" in the last 72 hours.  Invalid input(s): "APPERANCEUR"    Imaging: No results found.   Medications:    [MAR Hold] cefTRIAXone (ROCEPHIN)  IV 1 g (08/01/23 2128)    [MAR Hold] levothyroxine  150 mcg Oral Daily   [MAR Hold] metoprolol tartrate  25 mg Oral BID   [MAR Hold] potassium chloride  10 mEq Oral Daily   0.9 % irrigation (POUR BTL), [MAR Hold] acetaminophen **OR** [MAR Hold] acetaminophen, [MAR Hold] HYDROcodone-acetaminophen, [MAR Hold] ipratropium-albuterol, [MAR Hold] ondansetron **OR** [MAR Hold] ondansetron (ZOFRAN) IV  Assessment/ Plan:  [name] is a 82 y.o.  female with past medical conditions including diastolic heart failure, hypertension, hypothyroidism, COPD, and chronic kidney disease stage IV Patient reports to ED with RLE pain. She has been admitted for Hypokalemia [E87.6] Elevated troponin level [R79.89] Demand ischemia (HCC) [I24.89] Chronic anemia [D64.9] Atrial fibrillation with rapid ventricular response (HCC) [I48.91] Atrial fibrillation with RVR (HCC) [I48.91] Current use of long term anticoagulation [Z79.01] Traumatic hematoma of lower leg with infection, right, subsequent encounter [S80.11XD, L08.9] Acute renal failure superimposed on chronic kidney disease, unspecified acute renal failure type, unspecified CKD stage (HCC)  [N17.9, N18.9]   Acute Kidney Injury on chronic kidney disease stage IV with baseline creatinine 2.33 and GFR of 20 on 06/03/23.  Acute kidney injury secondary to cardiorenal  syndrome and concurrent illness. Creatinine elevated to 4.57 on admission.  No indication for dialysis Continue to monitor renal function.   Lab Results  Component Value Date   CREATININE 3.86 (H) 08/02/2023   CREATININE 4.35 (H) 08/01/2023   CREATININE 4.54 (H) 07/31/2023    Intake/Output Summary (Last 24 hours) at 08/02/2023 1152 Last data filed at 08/01/2023 1920 Gross per 24 hour  Intake 720 ml  Output --  Net 720 ml    2. Anemia of chronic kidney disease: with anemia of acute blood loss.  Lab Results  Component Value Date   HGB 8.8 (L)  08/02/2023    Holding ESA at this time  3. Hyponatremia: 134. Secondary to renal insufficiency. No intervention required.    LOS: 3 Jayion Schneck 10/31/202411:52 AM

## 2023-08-02 NOTE — Progress Notes (Signed)
PT Cancellation Note  Patient Details Name: [name] MRN: [id number] DOB: [birth year]   Cancelled Treatment:    Reason Eval/Treat Not Completed: Patient at procedure or test/unavailable Pt out of room for procedure this date, will maintain on PT caseload and continue to see as appropriate.    Malachi Pro, DPT 08/02/2023, 11:46 AM

## 2023-08-02 NOTE — Hospital Course (Addendum)
HPI: [name] is a 82 y.o. female with extensive chronic medical issues that include but are not limited to paroxysmal atrial fibrillation on chronic anticoagulation with Eliquis, CHF, COPD, chronic kidney disease, chronic anemia, etc.  She presents 07/30/23 to ED from home  by EMS for evaluation of rapid heart rate/palpitations and some shortness of breath. Of note she states that about 8 days ago she struck the right lower leg on the outside of her truck and still has a large bruise or blood clot on the side of her leg that she has kept wrapped. She has not had any fever and is able to ambulate but it is very painful for her.    Hospital course / significant events:  10/28: to ED, tachcyardic to 126, Cr up to 4.57 (baseline 2.3), EKG (+)Afib rate 140, then s/p metoprolol converted to NSR. Concern for infected leg hematoma, admitted to hospitalist service. Gen surg consult - no significant concern for infection, no plan for procedures, reasonable to complete abx for possible cellulitis. Nephrolgy consulted 10/29: general surgery advising at least another day monitoring leg though it's looking better today. Holding furosmide.  10/30: leg looking worse, NPO p mn tonight and possible surgical intervention tomorrow. Renal function minimal improvement  10/31:to OR for hematoma/debridement RLE 11/01: holding anticoag thru the weekend, surgery to reevaluate Monday (today is Friday)   Consultants:  General Surgery  Nephrology    Procedures/Surgeries: 08/02/23 evacuation RLE hematoma w/ excisional debridement skin and devitalized muscle            ASSESSMENT & PLAN:   Atrial fibrillation with rapid ventricular response (HCC) Converted to sinus with IV metoprolol in the ED Continue oral metoprolol holding apixaban pending surgery reevaluation 11/04   Acute Kidney Injury on chronic kidney disease stage IV with baseline creatinine 2.33 and GFR of 20 on 06/03/23. Likely d/t cardiorenal  syndrome  Cr 3.52 today, very gradual improvement  Nephrology following  Ok to continue furosemide 40 mg po bid Monitor BMP   Hypokalemia Replace as needed Monitor BMP   Traumatic hematoma of lower leg without deep infection but with possible mild cellulitis, right, initial encounter S/p evacuation RLE hematoma w/ excisional debridement skin and devitalized muscle  Abx continue w/ ceftriaxone  holding apixaban pending surgery reevaluation 11/04   Chronic diastolic congestive heart failure  No exacerbation - SOB likely d/t AFib RVR EF 60 to 65% 04/2023. Patient short of breath, BNP 293, chest x-ray showing cardiomegaly Continue beta blocker diuretics per nephrology - ok  Daily weights with intake and output monitoring   COPD (chronic obstructive pulmonary disease) (HCC) Not acutely exacerbated Continue home inhalers with DuoNebs as needed   OSA on CPAP CPAP nightly   Hypothyroidism Continue levothyroxine 150 mcg   Elevated troponin Suspect demand ischemia from rapid A-fib Patient denies chest pain and EKG with no ischemic changes Monitor for chest pain, no intervention at this time     obesity based on BMI: Body mass index is 30.48 kg/m.  Underweight - under 18.5  normal weight - 18.5 to 24.9 overweight - 25 to 29.9 obese - 30 or more     DVT prophylaxis: holding pending possible surgery, cannot reasonable do SCD given leg injury / pain IV fluids: no continuous IV fluids  Nutrition: cardiac diet  Central lines / invasive devices: none   Code Status: FULL CODE ACP documentation reviewed:  none on file in [facility]   TOC needs: will need SNF rehab  Barriers to dispo / significant pending items: pending surgery reevaluation after the weekend

## 2023-08-02 NOTE — Progress Notes (Signed)
[facility] SURGICAL PROGRESS NOTE  Hospital Day(s): 3.   Post op day(s):  [name]   Interval History:  Patient seen and examined No acute events or new complaints overnight.  Patient reports her pain is improved with ice packs RLE still remains very swollen No fever, chills WBC 7.6K Hgb to 8.8 - stable  Plan for RLE hematoma evacuation this morning with Dr Everlene Farrier   Vital signs in last 24 hours: [min-max] current  Temp:  [97.6 F (36.4 C)-98.5 F (36.9 C)] 98.5 F (36.9 C) (10/31 0403) Pulse Rate:  [57-61] 59 (10/31 0403) Resp:  [18-20] 18 (10/31 0403) BP: (147-167)/(40-71) 167/46 (10/31 0403) SpO2:  [95 %-97 %] 97 % (10/31 0403) Weight:  [78.2 kg] 78.2 kg (10/31 0406)     Height: 5\' 2"  (157.5 cm) Weight: 78.2 kg BMI (Calculated): 31.52   Intake/Output last 2 shifts:  10/30 0701 - 10/31 0700 In: 960 [P.O.:960] Out: -    Physical Exam:  Constitutional: alert, cooperative and no distress  Respiratory: breathing non-labored at rest  Cardiovascular: regular rate and sinus rhythm  Integumentary / Musculoskeletal: Large hematoma to the right lower extremity with ecchymosis extending from the proximal calf/shin to the mid-foot, area of swelling remains tense, no evidence of expansion and her foot is warm and well perfused, no evidence of necrotizing infection   Labs:     Latest Ref Rng & Units 08/02/2023    6:48 AM 08/01/2023    5:44 AM 07/31/2023    4:11 AM  CBC  WBC 4.0 - 10.5 K/uL 7.6  8.5  6.9   Hemoglobin 12.0 - 15.0 g/dL 8.8  8.9  8.5   Hematocrit 36.0 - 46.0 % 26.3  27.9  26.5   Platelets 150 - 400 K/uL 230  245  204       Latest Ref Rng & Units 08/01/2023    5:44 AM 07/31/2023    4:11 AM 07/30/2023    4:57 PM  CMP  Glucose 70 - 99 mg/dL 97  86  93   BUN 8 - 23 mg/dL 99  914  782   Creatinine 0.44 - 1.00 mg/dL 9.56  2.13  0.86   Sodium 135 - 145 mmol/L 134  135  138   Potassium 3.5 - 5.1 mmol/L 3.9  3.6  3.6   Chloride 98 - 111 mmol/L 99  99   103   CO2 22 - 32 mmol/L 21  24  22    Calcium 8.9 - 10.3 mg/dL 9.7  57.8  46.9     Imaging studies: No new pertinent imaging studies   Assessment/Plan:  82 y.o. female with traumatic right lower leg hematoma with possible cellulitis without abscess, complicated by pertinent comorbidities including including Afib with RVR requiring anticoagulation, CHF, CKD, HTN.    - Given continued slow progression and localized tension/swelling of this hematoma, we will plan for hematoma evacuation this morning/afternoon with Dr Everlene Farrier pending OR/Anesthesia availability  - All risks, benefits, and alternatives to above procedure(s) were discussed with the patient, all of her questions were answered to her expressed satisfaction, patient expresses she wishes to proceed, and informed consent was obtained.   - NPO for surgery - IV Abx (Rocephin) - Pain control prn   - Further management per primary service; we will follow    All of the above findings and recommendations were discussed with the patient, patient's family(husband at bedside), and the medical team, and all of their questions were answered to their  expressed satisfaction.  -- Lynden Oxford, PA-C [facility] 08/02/2023, 7:33 AM M-F: 7am - 4pm

## 2023-08-02 NOTE — Op Note (Signed)
  PRE-OPERATIVE DIAGNOSIS: Right leg hematoma   POST-OPERATIVE DIAGNOSIS:  Same   PROCEDURE:   Evacuation of Right leg hematoma Excisional debridement of skin and some devitalized muscle 4 cm2    SURGEON:  Surgeons and Role:    * Montserrath Madding F, MD - Primary   FINDINGS: LArge Right leg hematoma 250cc very friable tissue with raw surfaces making it difficult to obtain hemostasis   ANESTHESIA: Initial MAC converted to General LMA   DICTATION:  Patient was explained about the  procedure in detail, risk benefits possible complications and a consent was obtained. The patient taken to the operating room and placed in the supine. She was prepped and draped in the standard fashion. THere was a large hematoma. Using a knife elliptical incision created, there was a lot of clot. We evacuated the clot, there was some areas of the muscle that were not viable and I performed excisional debridement with cautery. There was persistent oozing from raw area, from the dermis to the muscle. We actually had to extend our incision in a transverse fashion to obtain good exposure. We used cautery to obtain hemostasis from  All raw surfaces in the muscle and subcutaneous tissue. There was no specific named vessel. Irrigation with normal saline and the wound was wrapped w Kerlix and coban wrap.  Needle and laparotomy counts were correct and there were no immediate complications   Leafy Ro, MD

## 2023-08-02 NOTE — Progress Notes (Signed)
PROGRESS NOTE    [name]   NUU:[id number] DOB: 07-23-1941  DOA: 07/29/2023 Date of Service: 08/02/23 which is hospital day 3  PCP: Lauro Regulus, MD       HPI: [name] is a 82 y.o. female with extensive chronic medical issues that include but are not limited to paroxysmal atrial fibrillation on chronic anticoagulation with Eliquis, CHF, COPD, chronic kidney disease, chronic anemia, etc.  She presents 07/30/23 to ED from home  by EMS for evaluation of rapid heart rate/palpitations and some shortness of breath. Of note she states that about 8 days ago she struck the right lower leg on the outside of her truck and still has a large bruise or blood clot on the side of her leg that she has kept wrapped. She has not had any fever and is able to ambulate but it is very painful for her.    Hospital course / significant events:  10/28: to ED, tachcyardic to 126, Cr up to 4.57 (baseline 2.3), EKG (+)Afib rate 140, then s/p metoprolol converted to NSR. Concern for infected leg hematoma, admitted to hospitalist service. Gen surg consult - no significant concern for infection, no plan for procedures, reasonable to complete abx for possible cellulitis. Nephrolgy consulted 10/29: general surgery advising at least another day monitoring leg though it's looking better today. Holding furosmide.  10/30: leg looking worse, NPO p mn tonight and possible surgical intervention tomorrow. Renal function minimal improvement  10/31:to OR for hematoma/debridement RLE   Consultants:  General Surgery  Nephrology    Procedures/Surgeries: 08/02/23 evacuation RLE hematoma w/ excisional debridement skin and devitalized muscle            ASSESSMENT & PLAN:   Atrial fibrillation with rapid ventricular response (HCC) Converted to sinus with IV metoprolol in the ED Continue oral metoprolol Continue apixaban   Acute Kidney Injury on chronic kidney disease stage IV with baseline creatinine 2.33  and GFR of 20 on 06/03/23. Likely d/t cardiorenal syndrome  Cr 3.86 today, very gradual improvement  Nephrology following  Ok to continue furosemide 40 mg po bid Monitor BMP   Hypokalemia Replace as needed Monitor BMP   Traumatic hematoma of lower leg without deep infection but with possible mild cellulitis, right, initial encounter Abx continue w/ ceftriaxone    Chronic diastolic congestive heart failure  No exacerbation - SOB likely d/t AFib RVR EF 60 to 65% 04/2023. Patient short of breath, BNP 293, chest x-ray showing cardiomegaly Continue beta blocker Holding diuretics given renal function  Daily weights with intake and output monitoring   COPD (chronic obstructive pulmonary disease) (HCC) Not acutely exacerbated Continue home inhalers with DuoNebs as needed   OSA on CPAP CPAP nightly   Hypothyroidism Continue levothyroxine 150 mcg   Elevated troponin Troponin bump from 79->157 Suspect demand ischemia from rapid A-fib Patient denies chest pain and EKG with no ischemic changes Monitor for chest pain, no intervention at this time     obesity based on BMI: Body mass index is 30.48 kg/m.  Underweight - under 18.5  normal weight - 18.5 to 24.9 overweight - 25 to 29.9 obese - 30 or more     DVT prophylaxis: holding pending possible surgery, cannot reasonable do SCD given leg injury / pain IV fluids: no continuous IV fluids  Nutrition: cardiac diet  Central lines / invasive devices: none   Code Status: FULL CODE ACP documentation reviewed:  none on file in [facility]   TOC needs: will  need SNF rehab  Barriers to dispo / significant pending items: possible surgery tomorrow                 Subjective / Brief ROS:  Patient reports no concerns other than wants something to eat and help sleep  Denies CP/SOB.  Denies new weakness.  Tolerating diet.  Reports no concerns w/ urination/defecation.   Family Communication: husband at bedside on rounds      Objective Findings:  Vitals:   08/02/23 1300 08/02/23 1306 08/02/23 1315 08/02/23 1330  BP: (!) 169/55 (!) 171/53 (!) 157/48 (!) 162/51  Pulse: 69   63  Resp: (!) 9   14  Temp: 98 F (36.7 C)   97.6 F (36.4 C)  TempSrc:      SpO2: 98%   99%  Weight:      Height:        Intake/Output Summary (Last 24 hours) at 08/02/2023 1443 Last data filed at 08/02/2023 1300 Gross per 24 hour  Intake 780 ml  Output 300 ml  Net 480 ml   Filed Weights   07/31/23 0500 08/01/23 0334 08/02/23 0406  Weight: 72 kg 75.6 kg 78.2 kg    Examination:  Physical Exam Constitutional:      General: She is not in acute distress. Cardiovascular:     Rate and Rhythm: Normal rate and regular rhythm.  Pulmonary:     Effort: Pulmonary effort is normal.  Musculoskeletal:     Right lower leg: Edema present.     Left lower leg: Edema present.  Skin:    Findings: Ecchymosis: tender, some fluctuance lateral RLE.     Comments: Postop Wrapped RLE was not disturbed   Neurological:     General: No focal deficit present.     Mental Status: She is alert and oriented to person, place, and time.  Psychiatric:        Mood and Affect: Mood normal.        Behavior: Behavior normal.          Scheduled Medications:   levothyroxine  150 mcg Oral Daily   metoprolol tartrate  25 mg Oral BID   potassium chloride  10 mEq Oral Daily    Continuous Infusions:  cefTRIAXone (ROCEPHIN)  IV 1 g (08/01/23 2128)    PRN Medications:  acetaminophen **OR** acetaminophen, HYDROcodone-acetaminophen, ipratropium-albuterol, ondansetron **OR** ondansetron (ZOFRAN) IV  Antimicrobials from admission:  Anti-infectives (From admission, onward)    Start     Dose/Rate Route Frequency Ordered Stop   07/30/23 2200  cefTRIAXone (ROCEPHIN) 1 g in sodium chloride 0.9 % 100 mL IVPB        1 g 200 mL/hr over 30 Minutes Intravenous Every 24 hours 07/30/23 0249     07/30/23 0115  cefTRIAXone (ROCEPHIN) 1 g in sodium  chloride 0.9 % 100 mL IVPB        1 g 200 mL/hr over 30 Minutes Intravenous STAT 07/30/23 0109 07/30/23 0252           Data Reviewed:  I have personally reviewed the following...  CBC: Recent Labs  Lab 07/29/23 2324 07/31/23 0411 08/01/23 0544 08/02/23 0648  WBC 8.9 6.9 8.5 7.6  NEUTROABS 6.2 3.6 4.0 4.0  HGB 8.5* 8.5* 8.9* 8.8*  HCT 26.1* 26.5* 27.9* 26.3*  MCV 84.7 86.0 84.8 82.7  PLT 204 204 245 230   Basic Metabolic Panel: Recent Labs  Lab 07/29/23 2324 07/30/23 1657 07/31/23 0411 08/01/23 0544 08/02/23 0648  NA 135  138 135 134* 134*  K 2.9* 3.6 3.6 3.9 4.0  CL 100 103 99 99 99  CO2 21* 22 24 21* 22  GLUCOSE 106* 93 86 97 95  BUN 95* 105* 104* 99* 98*  CREATININE 4.57* 4.28* 4.54* 4.35* 3.86*  CALCIUM 10.4* 10.2 10.1 9.7 9.8  MG 1.7  --   --   --   --    GFR: Estimated Creatinine Clearance: 10.9 mL/min (A) (by C-G formula based on SCr of 3.86 mg/dL (H)). Liver Function Tests: Recent Labs  Lab 07/29/23 2324  AST 30  ALT 19  ALKPHOS 84  BILITOT 0.9  PROT 7.8  ALBUMIN 3.4*   No results for input(s): "LIPASE", "AMYLASE" in the last 168 hours. No results for input(s): "AMMONIA" in the last 168 hours. Coagulation Profile: Recent Labs  Lab 07/29/23 2324  INR 1.6*   Cardiac Enzymes: No results for input(s): "CKTOTAL", "CKMB", "CKMBINDEX", "TROPONINI" in the last 168 hours. BNP (last 3 results) No results for input(s): "PROBNP" in the last 8760 hours. HbA1C: No results for input(s): "HGBA1C" in the last 72 hours. CBG: Recent Labs  Lab 08/02/23 1306  GLUCAP 95   Lipid Profile: No results for input(s): "CHOL", "HDL", "LDLCALC", "TRIG", "CHOLHDL", "LDLDIRECT" in the last 72 hours. Thyroid Function Tests: No results for input(s): "TSH", "T4TOTAL", "FREET4", "T3FREE", "THYROIDAB" in the last 72 hours. Anemia Panel: No results for input(s): "VITAMINB12", "FOLATE", "FERRITIN", "TIBC", "IRON", "RETICCTPCT" in the last 72 hours. Most Recent  Urinalysis On File:     Component Value Date/Time   COLORURINE YELLOW (A) 04/18/2023 1327   APPEARANCEUR TURBID (A) 04/18/2023 1327   APPEARANCEUR Clear 09/17/2018 1028   LABSPEC 1.010 04/18/2023 1327   LABSPEC 1.013 06/20/2012 1508   PHURINE 7.0 04/18/2023 1327   GLUCOSEU NEGATIVE 04/18/2023 1327   GLUCOSEU Negative 06/20/2012 1508   HGBUR SMALL (A) 04/18/2023 1327   BILIRUBINUR NEGATIVE 04/18/2023 1327   BILIRUBINUR Negative 09/17/2018 1028   BILIRUBINUR Negative 06/20/2012 1508   KETONESUR NEGATIVE 04/18/2023 1327   PROTEINUR 100 (A) 04/18/2023 1327   NITRITE NEGATIVE 04/18/2023 1327   LEUKOCYTESUR LARGE (A) 04/18/2023 1327   LEUKOCYTESUR Negative 06/20/2012 1508   Sepsis Labs: @LABRCNTIP (procalcitonin:4,lacticidven:4) Microbiology: No results found for this or any previous visit (from the past 240 hour(s)).    Radiology Studies last 3 days: DG Tibia/Fibula Right  Result Date: 07/30/2023 CLINICAL DATA:  Pain and swelling EXAM: RIGHT TIBIA AND FIBULA - 2 VIEW COMPARISON:  None Available. FINDINGS: No fracture or malalignment. Vascular calcifications. Protuberant soft tissue swelling over the mid to distal anterior lower leg. IMPRESSION: No acute osseous abnormality. Protuberant soft tissue swelling over the mid to distal anterior lower leg. Electronically Signed   By: Jasmine Pang M.D.   On: 07/30/2023 01:20   DG Chest Portable 1 View  Result Date: 07/30/2023 CLINICAL DATA:  Dyspnea EXAM: PORTABLE CHEST 1 VIEW COMPARISON:  06/01/2023 FINDINGS: Cardiomegaly. No acute airspace disease, pleural effusion, or pneumothorax. IMPRESSION: No active disease. Cardiomegaly. Electronically Signed   By: Jasmine Pang M.D.   On: 07/30/2023 00:29           Sunnie Nielsen, DO [facility] 08/02/2023, 2:43 PM    Dictation software may have been used to generate the above note. Typos may occur and escape review in typed/dictated notes. Please contact Dr Lyn Hollingshead directly  for clarity if needed.  Staff may message me via secure chat in Epic  but this may not receive an immediate response,  please  page me for urgent matters!  If 7PM-7AM, please contact night coverage [email or web address]

## 2023-08-02 NOTE — Transfer of Care (Signed)
Immediate Anesthesia Transfer of Care Note  Patient: [name]  Procedure(s) Performed: EVACUATION HEMATOMA (Right: Leg Lower)  Patient Location: PACU  Anesthesia Type:General  Level of Consciousness: awake  Airway & Oxygen Therapy: Patient Spontanous Breathing and Patient connected to face mask oxygen  Post-op Assessment: Report given to RN and Post -op Vital signs reviewed and stable  Post vital signs: Reviewed and stable  Last Vitals:  Vitals Value Taken Time  BP 169/55 08/02/23 1300  Temp    Pulse 68 08/02/23 1308  Resp 13 08/02/23 1308  SpO2 100 % 08/02/23 1308  Vitals shown include unfiled device data.  Last Pain:  Vitals:   08/02/23 1036  TempSrc: Temporal  PainSc: 0-No pain      Patients Stated Pain Goal: 0 (08/01/23 2126)  Complications: No notable events documented.

## 2023-08-02 NOTE — Anesthesia Postprocedure Evaluation (Signed)
Anesthesia Post Note  Patient: [name]  Procedure(s) Performed: EVACUATION HEMATOMA (Right: Leg Lower)  Patient location during evaluation: PACU Anesthesia Type: MAC Level of consciousness: awake Pain management: satisfactory to patient Vital Signs Assessment: post-procedure vital signs reviewed and stable Respiratory status: nonlabored ventilation and patient connected to nasal cannula oxygen Cardiovascular status: blood pressure returned to baseline Anesthetic complications: no   No notable events documented.   Last Vitals:  Vitals:   08/02/23 1315 08/02/23 1330  BP: (!) 157/48 (!) 162/51  Pulse:  63  Resp:  14  Temp:  36.4 C  SpO2:  99%    Last Pain:  Vitals:   08/02/23 1330  TempSrc:   PainSc: 4                  VAN STAVEREN,Shakaya Bhullar

## 2023-08-02 NOTE — Anesthesia Preprocedure Evaluation (Signed)
Anesthesia Evaluation  Patient identified by MRN, date of birth, ID band Patient awake    Reviewed: Allergy & Precautions, NPO status , Patient's Chart, lab work & pertinent test results  History of Anesthesia Complications (+) DIFFICULT AIRWAY and history of anesthetic complications  Airway Mallampati: III  TM Distance: <3 FB Neck ROM: full    Dental  (+) Teeth Intact   Pulmonary neg pulmonary ROS, COPD,  COPD inhaler   Pulmonary exam normal  + decreased breath sounds      Cardiovascular Exercise Tolerance: Poor hypertension, Pt. on medications +CHF  negative cardio ROS Normal cardiovascular exam+ dysrhythmias Atrial Fibrillation  Rhythm:Irregular Rate:Abnormal     Neuro/Psych    Depression    negative neurological ROS  negative psych ROS   GI/Hepatic negative GI ROS, Neg liver ROS,GERD  Medicated,,  Endo/Other  negative endocrine ROSdiabetesHypothyroidism    Renal/GU CRFRenal disease  negative genitourinary   Musculoskeletal  (+) Arthritis ,    Abdominal  (+) + obese  Peds negative pediatric ROS (+)  Hematology negative hematology ROS (+)   Anesthesia Other Findings Past Medical History: No date: Actinic keratosis No date: Albuminuria No date: Anemia No date: Arthritis No date: Asthma 04/12/2017: Basal cell carcinoma     Comment:  Above right lateral brow. Nodulocystic type. EDC No date: Cancer [facility])     Comment:  skin No date: Cataract cortical, senile No date: CHF (congestive heart failure) (HCC) No date: Diabetes mellitus without complication (HCC) No date: GERD (gastroesophageal reflux disease) No date: Hemorrhoids No date: History of kidney stones No date: Hyperlipidemia No date: Hypertension No date: Hypothyroidism No date: Lyme disease No date: No kidney function No date: OSA (obstructive sleep apnea) No date: Osteoporosis No date: Osteoporosis No date: Reflux esophagitis No date:  Steatohepatitis No date: Steatohepatitis  Past Surgical History: No date: ABDOMINAL HYSTERECTOMY No date: APPENDECTOMY 05/19/2022: AV FISTULA PLACEMENT; Left     Comment:  Procedure: ARTERIOVENOUS (AV) FISTULA CREATION (               BRACHIAL CEPHALIC );  Surgeon: Renford Dills, MD;                Location: [facility] ORS;  Service: Vascular;  Laterality:               Left; 1980: CARDIAC CATHETERIZATION     Comment:  [facility] 08/13/2014: CARDIAC CATHETERIZATION     Comment:  [facility]. no significant CAD, normal LVEDP.  No date: CATARACT EXTRACTION No date: CHOLECYSTECTOMY No date: COLONOSCOPY 12/07/2016: COLONOSCOPY WITH PROPOFOL; N/A     Comment:  Procedure: COLONOSCOPY WITH PROPOFOL;  Surgeon: Christena Deem, MD;  Location: [facility] ENDOSCOPY;  Service:               Endoscopy;  Laterality: N/A; No date: ESOPHAGOGASTRODUODENOSCOPY 12/07/2016: ESOPHAGOGASTRODUODENOSCOPY (EGD) WITH PROPOFOL; N/A     Comment:  Procedure: ESOPHAGOGASTRODUODENOSCOPY (EGD) WITH               PROPOFOL;  Surgeon: Christena Deem, MD;  Location:               [facility] ENDOSCOPY;  Service: Endoscopy;  Laterality: N/A; 01/07/2018: ESOPHAGOGASTRODUODENOSCOPY (EGD) WITH PROPOFOL; N/A     Comment:  Procedure: ESOPHAGOGASTRODUODENOSCOPY (EGD) WITH               PROPOFOL;  Surgeon: Christena Deem, MD;  Location:  [facility] ENDOSCOPY;  Service: Endoscopy;  Laterality: N/A; No date: EYE SURGERY No date: HEMORRHOID SURGERY No date: PARTIAL HYSTERECTOMY 08/25/2019: THYROIDECTOMY; N/A     Comment:  Procedure: THYROIDECTOMY EXTRACTION OF SUBTOTAL               COMPONENT; PARATHYROID AUTOTRANSPLANT X1;  Surgeon:               Duanne Guess, MD;  Location: [facility] ORS;  Service:               General;  Laterality: N/A;  With Nerve Monitoring(RLN) No date: TONSILLECTOMY  BMI    Body Mass Index: 31.53 kg/m      Reproductive/Obstetrics negative OB ROS                              Anesthesia Physical Anesthesia Plan  ASA: 4  Anesthesia Plan: MAC   Post-op Pain Management:    Induction: Intravenous  PONV Risk Score and Plan:   Airway Management Planned: Natural Airway and Nasal Cannula  Additional Equipment:   Intra-op Plan:   Post-operative Plan:   Informed Consent: I have reviewed the patients History and Physical, chart, labs and discussed the procedure including the risks, benefits and alternatives for the proposed anesthesia with the patient or authorized representative who has indicated his/her understanding and acceptance.       Plan Discussed with: CRNA and Surgeon  Anesthesia Plan Comments:        Anesthesia Quick Evaluation

## 2023-08-03 ENCOUNTER — Encounter: Payer: Self-pay | Admitting: Surgery

## 2023-08-03 DIAGNOSIS — S8011XD Contusion of right lower leg, subsequent encounter: Secondary | ICD-10-CM | POA: Diagnosis not present

## 2023-08-03 DIAGNOSIS — L089 Local infection of the skin and subcutaneous tissue, unspecified: Secondary | ICD-10-CM | POA: Diagnosis not present

## 2023-08-03 LAB — PROTIME-INR
INR: 1.2 (ref 0.8–1.2)
Prothrombin Time: 15.7 s — ABNORMAL HIGH (ref 11.4–15.2)

## 2023-08-03 LAB — COMPREHENSIVE METABOLIC PANEL
ALT: 30 U/L (ref 0–44)
AST: 41 U/L (ref 15–41)
Albumin: 2.8 g/dL — ABNORMAL LOW (ref 3.5–5.0)
Alkaline Phosphatase: 83 U/L (ref 38–126)
Anion gap: 12 (ref 5–15)
BUN: 94 mg/dL — ABNORMAL HIGH (ref 8–23)
CO2: 23 mmol/L (ref 22–32)
Calcium: 9.6 mg/dL (ref 8.9–10.3)
Chloride: 102 mmol/L (ref 98–111)
Creatinine, Ser: 3.52 mg/dL — ABNORMAL HIGH (ref 0.44–1.00)
GFR, Estimated: 12 mL/min — ABNORMAL LOW (ref >60)
Glucose, Bld: 97 mg/dL (ref 70–99)
Potassium: 4.5 mmol/L (ref 3.5–5.1)
Sodium: 137 mmol/L (ref 135–145)
Total Bilirubin: 0.5 mg/dL (ref 0.3–1.2)
Total Protein: 6.5 g/dL (ref 6.5–8.1)

## 2023-08-03 LAB — CBC
HCT: 25 % — ABNORMAL LOW (ref 36.0–46.0)
Hemoglobin: 8.1 g/dL — ABNORMAL LOW (ref 12.0–15.0)
MCH: 28 pg (ref 26.0–34.0)
MCHC: 32.4 g/dL (ref 30.0–36.0)
MCV: 86.5 fL (ref 80.0–100.0)
Platelets: 231 10*3/uL (ref 150–400)
RBC: 2.89 MIL/uL — ABNORMAL LOW (ref 3.87–5.11)
RDW: 15.9 % — ABNORMAL HIGH (ref 11.5–15.5)
WBC: 7.1 10*3/uL (ref 4.0–10.5)
nRBC: 0 % (ref 0.0–0.2)

## 2023-08-03 MED ORDER — OXYCODONE HCL 5 MG PO TABS
5.0000 mg | ORAL_TABLET | ORAL | Status: DC
  Administered 2023-08-03 – 2023-08-06 (×21): 5 mg via ORAL
  Filled 2023-08-03 (×22): qty 1

## 2023-08-03 NOTE — Progress Notes (Signed)
[facility]  ROUNDING NOTE   Subjective:   [name] IS A 82 y.o. female with past medical conditions including diastolic heart failure, hypertension, hypothyroidism, COPD, and chronic kidney disease stage IV. Patient reports to ED with RLE pain. She has been admitted for Hypokalemia [E87.6] Elevated troponin level [R79.89] Demand ischemia (HCC) [I24.89] Chronic anemia [D64.9] Atrial fibrillation with rapid ventricular response (HCC) [I48.91] Atrial fibrillation with RVR (HCC) [I48.91] Current use of long term anticoagulation [Z79.01] Traumatic hematoma of lower leg with infection, right, subsequent encounter [S80.11XD, L08.9] Acute renal failure superimposed on chronic kidney disease, unspecified acute renal failure type, unspecified CKD stage (HCC) [N17.9, N18.9]  Patient resting quietly States, due to pain, she was unable to rest overnight Partially completed breakfast tray at bedside  Creatinine 3.52   Objective:  Vital signs in last 24 hours:  Temp:  [97.6 F (36.4 C)-98 F (36.7 C)] 98 F (36.7 C) (11/01 0417) Pulse Rate:  [59-70] 59 (11/01 0417) Resp:  [9-18] 15 (11/01 0417) BP: (145-171)/(48-56) 150/51 (11/01 0417) SpO2:  [97 %-99 %] 97 % (11/01 0417) Weight:  [78 kg] 78 kg (11/01 0500)  Weight change: -0.2 kg Filed Weights   08/01/23 0334 08/02/23 0406 08/03/23 0500  Weight: 75.6 kg 78.2 kg 78 kg    Intake/Output: I/O last 3 completed shifts: In: 300 [P.O.:300] Out: 300 [Blood:300]   Intake/Output this shift:  Total I/O In: 360 [P.O.:360] Out: -   Physical Exam: General: NAD  Head: Normocephalic, atraumatic. Moist oral mucosal membranes  Eyes: Anicteric  Lungs:  Clear to auscultation, room air  Heart: Regular rate and rhythm  Abdomen:  Soft, nontender  Extremities:  trace peripheral edema.RLE bruising  Neurologic: Alert, moving all four extremities  Skin: RLE abrasions,bandaged       Basic Metabolic Panel: Recent Labs  Lab  07/29/23 2324 07/30/23 1657 07/31/23 0411 08/01/23 0544 08/02/23 0648 08/03/23 0232  NA 135 138 135 134* 134* 137  K 2.9* 3.6 3.6 3.9 4.0 4.5  CL 100 103 99 99 99 102  CO2 21* 22 24 21* 22 23  GLUCOSE 106* 93 86 97 95 97  BUN 95* 105* 104* 99* 98* 94*  CREATININE 4.57* 4.28* 4.54* 4.35* 3.86* 3.52*  CALCIUM 10.4* 10.2 10.1 9.7 9.8 9.6  MG 1.7  --   --   --   --   --     Liver Function Tests: Recent Labs  Lab 07/29/23 2324 08/03/23 0232  AST 30 41  ALT 19 30  ALKPHOS 84 83  BILITOT 0.9 0.5  PROT 7.8 6.5  ALBUMIN 3.4* 2.8*   No results for input(s): "LIPASE", "AMYLASE" in the last 168 hours. No results for input(s): "AMMONIA" in the last 168 hours.  CBC: Recent Labs  Lab 07/29/23 2324 07/31/23 0411 08/01/23 0544 08/02/23 0648 08/03/23 0232  WBC 8.9 6.9 8.5 7.6 7.1  NEUTROABS 6.2 3.6 4.0 4.0  --   HGB 8.5* 8.5* 8.9* 8.8* 8.1*  HCT 26.1* 26.5* 27.9* 26.3* 25.0*  MCV 84.7 86.0 84.8 82.7 86.5  PLT 204 204 245 230 231    Cardiac Enzymes: No results for input(s): "CKTOTAL", "CKMB", "CKMBINDEX", "TROPONINI" in the last 168 hours.  BNP: Invalid input(s): "POCBNP"  CBG: Recent Labs  Lab 08/02/23 1306  GLUCAP 95    Microbiology: Results for orders placed or performed during the hospital encounter of 04/16/23  SARS Coronavirus 2 by RT PCR (hospital order, performed in [facility] hospital lab) *cepheid single result test*  Anterior Nasal Swab     Status: None   Collection Time: 04/16/23  2:10 PM   Specimen: Anterior Nasal Swab  Result Value Ref Range Status   SARS Coronavirus 2 by RT PCR NEGATIVE NEGATIVE Final    Comment: (NOTE) SARS-CoV-2 target nucleic acids are NOT DETECTED.  The SARS-CoV-2 RNA is generally detectable in upper and lower respiratory specimens during the acute phase of infection. The lowest concentration of SARS-CoV-2 viral copies this assay can detect is 250 copies / mL. A negative result does not preclude SARS-CoV-2 infection and  should not be used as the sole basis for treatment or other patient management decisions.  A negative result may occur with improper specimen collection / handling, submission of specimen other than nasopharyngeal swab, presence of viral mutation(s) within the areas targeted by this assay, and inadequate number of viral copies (<250 copies / mL). A negative result must be combined with clinical observations, patient history, and epidemiological information.  Fact Sheet for Patients:   RoadLapTop.co.za  Fact Sheet for Healthcare Providers: http://kim-miller.com/  This test is not yet approved or  cleared by the [place] FDA and has been authorized for detection and/or diagnosis of SARS-CoV-2 by FDA under an Emergency Use Authorization (EUA).  This EUA will remain in effect (meaning this test can be used) for the duration of the COVID-19 declaration under Section 564(b)(1) of the Act, 21 U.S.C. section 360bbb-3(b)(1), unless the authorization is terminated or revoked sooner.  Performed at [facility], [address] [place], [place] [address]   Respiratory (~20 pathogens) panel by PCR     Status: None   Collection Time: 04/18/23  1:27 PM   Specimen: Nasopharyngeal Swab; Respiratory  Result Value Ref Range Status   Adenovirus NOT DETECTED NOT DETECTED Final   Coronavirus 229E NOT DETECTED NOT DETECTED Final    Comment: (NOTE) The Coronavirus on the Respiratory Panel, DOES NOT test for the novel  Coronavirus (2019 nCoV)    Coronavirus HKU1 NOT DETECTED NOT DETECTED Final   Coronavirus NL63 NOT DETECTED NOT DETECTED Final   Coronavirus OC43 NOT DETECTED NOT DETECTED Final   Metapneumovirus NOT DETECTED NOT DETECTED Final   Rhinovirus / Enterovirus NOT DETECTED NOT DETECTED Final   Influenza A NOT DETECTED NOT DETECTED Final   Influenza B NOT DETECTED NOT DETECTED Final   Parainfluenza Virus 1 NOT DETECTED NOT DETECTED  Final   Parainfluenza Virus 2 NOT DETECTED NOT DETECTED Final   Parainfluenza Virus 3 NOT DETECTED NOT DETECTED Final   Parainfluenza Virus 4 NOT DETECTED NOT DETECTED Final   Respiratory Syncytial Virus NOT DETECTED NOT DETECTED Final   Bordetella pertussis NOT DETECTED NOT DETECTED Final   Bordetella Parapertussis NOT DETECTED NOT DETECTED Final   Chlamydophila pneumoniae NOT DETECTED NOT DETECTED Final   Mycoplasma pneumoniae NOT DETECTED NOT DETECTED Final    Comment: Performed at [facility], [address], [place], [place] [address]  Urine Culture (for pregnant, neutropenic or urologic patients or patients with an indwelling urinary catheter)     Status: Abnormal   Collection Time: 04/18/23  1:28 PM   Specimen: Urine, Clean Catch  Result Value Ref Range Status   Specimen Description   Final    URINE, CLEAN CATCH Performed at [facility], [address], [place], [place] [address]    Special Requests   Final    NONE Performed at [facility], [address], [place], [place] [address]    Culture  MULTIPLE SPECIES PRESENT, SUGGEST RECOLLECTION (A)  Final   Report Status 04/19/2023 FINAL  Final  Expectorated Sputum Assessment w Gram Stain, Rflx to Resp Cult     Status: None   Collection Time: 04/19/23  2:00 PM   Specimen: Sputum  Result Value Ref Range Status   Specimen Description SPUTUM  Final   Special Requests NONE  Final   Sputum evaluation   Final    THIS SPECIMEN IS ACCEPTABLE FOR SPUTUM CULTURE Performed at [facility], [address] [place], [place] [address]    Report Status 04/19/2023 FINAL  Final  Culture, Respiratory w Gram Stain     Status: None   Collection Time: 04/19/23  2:00 PM   Specimen: SPU  Result Value Ref Range Status   Specimen Description   Final    SPUTUM Performed at [facility], [address] [place], [place] [address]    Special Requests   Final    NONE Reflexed from [id number] Performed at  [facility], [address] [place], [place] [address]    Gram Stain   Final    FEW SQUAMOUS EPITHELIAL CELLS PRESENT FEW WBC PRESENT, PREDOMINANTLY MONONUCLEAR MODERATE GRAM POSITIVE COCCI IN PAIRS AND CHAINS MODERATE GRAM NEGATIVE RODS FEW YEAST    Culture   Final    MODERATE Normal respiratory flora-no Staph aureus or Pseudomonas seen Performed at [facility], [address], [place], [place] [address]    Report Status 04/23/2023 FINAL  Final    Coagulation Studies: Recent Labs    08/03/23 0232  LABPROT 15.7*  INR 1.2     Urinalysis: No results for input(s): "COLORURINE", "LABSPEC", "PHURINE", "GLUCOSEU", "HGBUR", "BILIRUBINUR", "KETONESUR", "PROTEINUR", "UROBILINOGEN", "NITRITE", "LEUKOCYTESUR" in the last 72 hours.  Invalid input(s): "APPERANCEUR"    Imaging: No results found.   Medications:    cefTRIAXone (ROCEPHIN)  IV 1 g (08/02/23 2109)    acetaminophen  1,000 mg Oral Q6H   levothyroxine  150 mcg Oral Daily   metoprolol tartrate  25 mg Oral BID   oxyCODONE  5 mg Oral Q4H   potassium chloride  10 mEq Oral Daily   ipratropium-albuterol, morphine injection, ondansetron **OR** ondansetron (ZOFRAN) IV  Assessment/ Plan:  [name] is a 82 y.o.  female with past medical conditions including diastolic heart failure, hypertension, hypothyroidism, COPD, and chronic kidney disease stage IV Patient reports to ED with RLE pain. She has been admitted for Hypokalemia [E87.6] Elevated troponin level [R79.89] Demand ischemia (HCC) [I24.89] Chronic anemia [D64.9] Atrial fibrillation with rapid ventricular response (HCC) [I48.91] Atrial fibrillation with RVR (HCC) [I48.91] Current use of long term anticoagulation [Z79.01] Traumatic hematoma of lower leg with infection, right, subsequent encounter [S80.11XD, L08.9] Acute renal failure superimposed on chronic kidney disease, unspecified acute renal failure type, unspecified CKD stage (HCC)  [N17.9, N18.9]   Acute Kidney Injury on chronic kidney disease stage IV with baseline creatinine 2.33 and GFR of 20 on 06/03/23.  Acute kidney injury secondary to cardiorenal  syndrome and concurrent illness. Creatinine elevated to 4.57 on admission.  Creatinine has improved. No acute indication for dialysis. Will monitor. Diuretics remain held  Lab Results  Component Value Date   CREATININE 3.52 (H) 08/03/2023   CREATININE 3.86 (H) 08/02/2023   CREATININE 4.35 (H) 08/01/2023    Intake/Output Summary (Last 24 hours) at 08/03/2023 1213 Last data filed at 08/03/2023 1102 Gross per 24 hour  Intake 420 ml  Output 300 ml  Net 120  ml    2. Anemia of chronic kidney disease: with anemia of acute blood loss.  Lab Results  Component Value Date   HGB 8.1 (L) 08/03/2023    Hgb decreased today. Will monitor for rebound from procedure  3. Hyponatremia: 137 today. Secondary to renal insufficiency.    LOS: 4 Annemarie Sebree 11/1/202412:13 PM

## 2023-08-03 NOTE — Progress Notes (Signed)
[facility] SURGICAL PROGRESS NOTE  Hospital Day(s): 4.   Post op day(s): 1 Day Post-Op.   Interval History:  Patient seen and examined No acute events or new complaints overnight.  Patient reports significant RLE pain; needing pain medications  No fever, chills WBC 7.1K Hgb to 8.1 (from 8.8 yesterday)   Vital signs in last 24 hours: [min-max] current  Temp:  [97.6 F (36.4 C)-98 F (36.7 C)] 98 F (36.7 C) (11/01 0417) Pulse Rate:  [59-70] 59 (11/01 0417) Resp:  [9-18] 15 (11/01 0417) BP: (145-171)/(48-56) 150/51 (11/01 0417) SpO2:  [96 %-99 %] 97 % (11/01 0417) Weight:  [78 kg] 78 kg (11/01 0500)     Height: 5\' 2"  (157.5 cm) Weight: 78 kg BMI (Calculated): 31.44   Intake/Output last 2 shifts:  10/31 0701 - 11/01 0700 In: 60 [P.O.:60] Out: 300 [Blood:300]   Physical Exam:  Constitutional: alert, cooperative and no distress  Respiratory: breathing non-labored at rest  Cardiovascular: regular rate and sinus rhythm  Integumentary / Musculoskeletal: RLE wrapped with Coban. She is neurovascularly intact distally, DP/PT pulses 2+   Labs:     Latest Ref Rng & Units 08/03/2023    2:32 AM 08/02/2023    6:48 AM 08/01/2023    5:44 AM  CBC  WBC 4.0 - 10.5 K/uL 7.1  7.6  8.5   Hemoglobin 12.0 - 15.0 g/dL 8.1  8.8  8.9   Hematocrit 36.0 - 46.0 % 25.0  26.3  27.9   Platelets 150 - 400 K/uL 231  230  245       Latest Ref Rng & Units 08/03/2023    2:32 AM 08/02/2023    6:48 AM 08/01/2023    5:44 AM  CMP  Glucose 70 - 99 mg/dL 97  95  97   BUN 8 - 23 mg/dL 94  98  99   Creatinine 0.44 - 1.00 mg/dL 1.61  0.96  0.45   Sodium 135 - 145 mmol/L 137  134  134   Potassium 3.5 - 5.1 mmol/L 4.5  4.0  3.9   Chloride 98 - 111 mmol/L 102  99  99   CO2 22 - 32 mmol/L 23  22  21    Calcium 8.9 - 10.3 mg/dL 9.6  9.8  9.7   Total Protein 6.5 - 8.1 g/dL 6.5     Total Bilirubin 0.3 - 1.2 mg/dL 0.5     Alkaline Phos 38 - 126 U/L 83     AST 15 - 41 U/L 41     ALT 0 - 44  U/L 30       Imaging studies: No new pertinent imaging studies   Assessment/Plan:  82 y.o. female with traumatic right lower leg hematoma with possible cellulitis without abscess, complicated by pertinent comorbidities including including Afib with RVR requiring anticoagulation, CHF, CKD, HTN.    - We will leave current dressing in place until Monday (11/04) secondary to significant oozing intra-operatively. We will formally reassess wound at that time.  - Pain control prn; I will change oxycodone to q4 as scheduled  - Monitor H&H  - Further management per primary service; we will follow    All of the above findings and recommendations were discussed with the patient, patient's family (husband at bedside), and the medical team, and all of their questions were answered to their expressed satisfaction.  -- Lynden Oxford, PA-C [facility] 08/03/2023, 7:35 AM M-F: 7am - 4pm

## 2023-08-03 NOTE — Progress Notes (Signed)
PROGRESS NOTE    [name]   BMW:[id number] DOB: [birth year]  DOA: 07/29/2023 Date of Service: 08/03/23 which is hospital day 4  PCP: Lauro Regulus, MD       HPI: [name] is a 82 y.o. female with extensive chronic medical issues that include but are not limited to paroxysmal atrial fibrillation on chronic anticoagulation with Eliquis, CHF, COPD, chronic kidney disease, chronic anemia, etc.  She presents 07/30/23 to ED from home  by EMS for evaluation of rapid heart rate/palpitations and some shortness of breath. Of note she states that about 8 days ago she struck the right lower leg on the outside of her truck and still has a large bruise or blood clot on the side of her leg that she has kept wrapped. She has not had any fever and is able to ambulate but it is very painful for her.    Hospital course / significant events:  10/28: to ED, tachcyardic to 126, Cr up to 4.57 (baseline 2.3), EKG (+)Afib rate 140, then s/p metoprolol converted to NSR. Concern for infected leg hematoma, admitted to hospitalist service. Gen surg consult - no significant concern for infection, no plan for procedures, reasonable to complete abx for possible cellulitis. Nephrolgy consulted 10/29: general surgery advising at least another day monitoring leg though it's looking better today. Holding furosmide.  10/30: leg looking worse, NPO p mn tonight and possible surgical intervention tomorrow. Renal function minimal improvement  10/31:to OR for hematoma/debridement RLE 11/01: holding anticoag thru the weekend, surgery to reevaluate Monday (today is Friday)   Consultants:  General Surgery  Nephrology    Procedures/Surgeries: 08/02/23 evacuation RLE hematoma w/ excisional debridement skin and devitalized muscle            ASSESSMENT & PLAN:   Atrial fibrillation with rapid ventricular response (HCC) Converted to sinus with IV metoprolol in the ED Continue oral metoprolol holding apixaban  pending surgery reevaluation 11/04   Acute Kidney Injury on chronic kidney disease stage IV with baseline creatinine 2.33 and GFR of 20 on 06/03/23. Likely d/t cardiorenal syndrome  Cr 3.52 today, very gradual improvement  Nephrology following  Ok to continue furosemide 40 mg po bid Monitor BMP   Hypokalemia Replace as needed Monitor BMP   Traumatic hematoma of lower leg without deep infection but with possible mild cellulitis, right, initial encounter S/p evacuation RLE hematoma w/ excisional debridement skin and devitalized muscle  Abx continue w/ ceftriaxone  holding apixaban pending surgery reevaluation 11/04   Chronic diastolic congestive heart failure  No exacerbation - SOB likely d/t AFib RVR EF 60 to 65% 04/2023. Patient short of breath, BNP 293, chest x-ray showing cardiomegaly Continue beta blocker diuretics per nephrology - ok  Daily weights with intake and output monitoring   COPD (chronic obstructive pulmonary disease) (HCC) Not acutely exacerbated Continue home inhalers with DuoNebs as needed   OSA on CPAP CPAP nightly   Hypothyroidism Continue levothyroxine 150 mcg   Elevated troponin Suspect demand ischemia from rapid A-fib Patient denies chest pain and EKG with no ischemic changes Monitor for chest pain, no intervention at this time     obesity based on BMI: Body mass index is 30.48 kg/m.  Underweight - under 18.5  normal weight - 18.5 to 24.9 overweight - 25 to 29.9 obese - 30 or more     DVT prophylaxis: holding pending possible surgery, cannot reasonable do SCD given leg injury / pain IV fluids: no continuous IV fluids  Nutrition: cardiac diet  Central lines / invasive devices: none   Code Status: FULL CODE ACP documentation reviewed:  none on file in [facility]   TOC needs: will need SNF rehab  Barriers to dispo / significant pending items: pending surgery reevaluation after the weekend                 Subjective / Brief ROS:   Patient reports she's just tired today but did sleep better last night  Denies CP/SOB.  Denies new weakness.  Tolerating diet.  Reports no concerns w/ urination/defecation.   Family Communication: none at this time     Objective Findings:  Vitals:   08/02/23 2012 08/03/23 0417 08/03/23 0500 08/03/23 1306  BP: (!) 148/56 (!) 150/51  (!) 147/64  Pulse: 70 (!) 59  62  Resp: 18 15  18   Temp: 97.9 F (36.6 C) 98 F (36.7 C)  98.5 F (36.9 C)  TempSrc: Oral Oral    SpO2: 97% 97%  99%  Weight:   78 kg   Height:        Intake/Output Summary (Last 24 hours) at 08/03/2023 1428 Last data filed at 08/03/2023 1102 Gross per 24 hour  Intake 360 ml  Output --  Net 360 ml   Filed Weights   08/01/23 0334 08/02/23 0406 08/03/23 0500  Weight: 75.6 kg 78.2 kg 78 kg    Examination:  Physical Exam Constitutional:      General: She is not in acute distress. Cardiovascular:     Rate and Rhythm: Normal rate and regular rhythm.  Pulmonary:     Effort: Pulmonary effort is normal.  Musculoskeletal:     Right lower leg: Edema present.     Left lower leg: Edema present.  Skin:    Findings: Ecchymosis: tender, some fluctuance lateral RLE.     Comments: Postop Wrapped RLE was not disturbed   Neurological:     General: No focal deficit present.     Mental Status: She is alert and oriented to person, place, and time.  Psychiatric:        Mood and Affect: Mood normal.        Behavior: Behavior normal.          Scheduled Medications:   acetaminophen  1,000 mg Oral Q6H   levothyroxine  150 mcg Oral Daily   metoprolol tartrate  25 mg Oral BID   oxyCODONE  5 mg Oral Q4H   potassium chloride  10 mEq Oral Daily    Continuous Infusions:  cefTRIAXone (ROCEPHIN)  IV 1 g (08/02/23 2109)    PRN Medications:  ipratropium-albuterol, morphine injection, ondansetron **OR** ondansetron (ZOFRAN) IV  Antimicrobials from admission:  Anti-infectives (From admission, onward)    Start      Dose/Rate Route Frequency Ordered Stop   07/30/23 2200  cefTRIAXone (ROCEPHIN) 1 g in sodium chloride 0.9 % 100 mL IVPB        1 g 200 mL/hr over 30 Minutes Intravenous Every 24 hours 07/30/23 0249     07/30/23 0115  cefTRIAXone (ROCEPHIN) 1 g in sodium chloride 0.9 % 100 mL IVPB        1 g 200 mL/hr over 30 Minutes Intravenous STAT 07/30/23 0109 07/30/23 0252           Data Reviewed:  I have personally reviewed the following...  CBC: Recent Labs  Lab 07/29/23 2324 07/31/23 0411 08/01/23 0544 08/02/23 0648 08/03/23 0232  WBC 8.9 6.9 8.5 7.6 7.1  NEUTROABS  6.2 3.6 4.0 4.0  --   HGB 8.5* 8.5* 8.9* 8.8* 8.1*  HCT 26.1* 26.5* 27.9* 26.3* 25.0*  MCV 84.7 86.0 84.8 82.7 86.5  PLT 204 204 245 230 231   Basic Metabolic Panel: Recent Labs  Lab 07/29/23 2324 07/30/23 1657 07/31/23 0411 08/01/23 0544 08/02/23 0648 08/03/23 0232  NA 135 138 135 134* 134* 137  K 2.9* 3.6 3.6 3.9 4.0 4.5  CL 100 103 99 99 99 102  CO2 21* 22 24 21* 22 23  GLUCOSE 106* 93 86 97 95 97  BUN 95* 105* 104* 99* 98* 94*  CREATININE 4.57* 4.28* 4.54* 4.35* 3.86* 3.52*  CALCIUM 10.4* 10.2 10.1 9.7 9.8 9.6  MG 1.7  --   --   --   --   --    GFR: Estimated Creatinine Clearance: 11.9 mL/min (A) (by C-G formula based on SCr of 3.52 mg/dL (H)). Liver Function Tests: Recent Labs  Lab 07/29/23 2324 08/03/23 0232  AST 30 41  ALT 19 30  ALKPHOS 84 83  BILITOT 0.9 0.5  PROT 7.8 6.5  ALBUMIN 3.4* 2.8*   No results for input(s): "LIPASE", "AMYLASE" in the last 168 hours. No results for input(s): "AMMONIA" in the last 168 hours. Coagulation Profile: Recent Labs  Lab 07/29/23 2324 08/03/23 0232  INR 1.6* 1.2   Cardiac Enzymes: No results for input(s): "CKTOTAL", "CKMB", "CKMBINDEX", "TROPONINI" in the last 168 hours. BNP (last 3 results) No results for input(s): "PROBNP" in the last 8760 hours. HbA1C: No results for input(s): "HGBA1C" in the last 72 hours. CBG: Recent Labs  Lab  08/02/23 1306  GLUCAP 95   Lipid Profile: No results for input(s): "CHOL", "HDL", "LDLCALC", "TRIG", "CHOLHDL", "LDLDIRECT" in the last 72 hours. Thyroid Function Tests: No results for input(s): "TSH", "T4TOTAL", "FREET4", "T3FREE", "THYROIDAB" in the last 72 hours. Anemia Panel: No results for input(s): "VITAMINB12", "FOLATE", "FERRITIN", "TIBC", "IRON", "RETICCTPCT" in the last 72 hours. Most Recent Urinalysis On File:     Component Value Date/Time   COLORURINE YELLOW (A) 04/18/2023 1327   APPEARANCEUR TURBID (A) 04/18/2023 1327   APPEARANCEUR Clear 09/17/2018 1028   LABSPEC 1.010 04/18/2023 1327   LABSPEC 1.013 06/20/2012 1508   PHURINE 7.0 04/18/2023 1327   GLUCOSEU NEGATIVE 04/18/2023 1327   GLUCOSEU Negative 06/20/2012 1508   HGBUR SMALL (A) 04/18/2023 1327   BILIRUBINUR NEGATIVE 04/18/2023 1327   BILIRUBINUR Negative 09/17/2018 1028   BILIRUBINUR Negative 06/20/2012 1508   KETONESUR NEGATIVE 04/18/2023 1327   PROTEINUR 100 (A) 04/18/2023 1327   NITRITE NEGATIVE 04/18/2023 1327   LEUKOCYTESUR LARGE (A) 04/18/2023 1327   LEUKOCYTESUR Negative 06/20/2012 1508   Sepsis Labs: @LABRCNTIP (procalcitonin:4,lacticidven:4) Microbiology: No results found for this or any previous visit (from the past 240 hour(s)).    Radiology Studies last 3 days: No results found.         Sunnie Nielsen, DO [facility] 08/03/2023, 2:28 PM    Dictation software may have been used to generate the above note. Typos may occur and escape review in typed/dictated notes. Please contact Dr Lyn Hollingshead directly for clarity if needed.  Staff may message me via secure chat in Epic  but this may not receive an immediate response,  please page me for urgent matters!  If 7PM-7AM, please contact night coverage [email or web address]

## 2023-08-04 DIAGNOSIS — L089 Local infection of the skin and subcutaneous tissue, unspecified: Secondary | ICD-10-CM | POA: Diagnosis not present

## 2023-08-04 DIAGNOSIS — S8011XD Contusion of right lower leg, subsequent encounter: Secondary | ICD-10-CM | POA: Diagnosis not present

## 2023-08-04 LAB — TYPE AND SCREEN
ABO/RH(D): O POS
Antibody Screen: NEGATIVE
Unit division: 0
Unit division: 0

## 2023-08-04 LAB — BPAM RBC
Blood Product Expiration Date: 202411282359
Blood Product Expiration Date: 202411282359
Unit Type and Rh: 5100
Unit Type and Rh: 5100

## 2023-08-04 LAB — PREPARE RBC (CROSSMATCH)

## 2023-08-04 MED ORDER — POLYETHYLENE GLYCOL 3350 17 G PO PACK
17.0000 g | PACK | Freq: Every day | ORAL | Status: DC
  Administered 2023-08-04 – 2023-08-06 (×2): 17 g via ORAL
  Filled 2023-08-04 (×3): qty 1

## 2023-08-04 MED ORDER — SENNOSIDES-DOCUSATE SODIUM 8.6-50 MG PO TABS
2.0000 | ORAL_TABLET | Freq: Every day | ORAL | Status: DC
  Administered 2023-08-04 – 2023-08-05 (×2): 2 via ORAL
  Filled 2023-08-04 (×2): qty 2

## 2023-08-04 NOTE — Progress Notes (Signed)
PROGRESS NOTE    KELLY RANIERI   ZOX:[id number] DOB: 23-Mar-1941  DOA: 07/29/2023 Date of Service: 08/04/23 which is hospital day 5  PCP: Lauro Regulus, MD       HPI: [name] is a 82 y.o. female with extensive chronic medical issues that include but are not limited to paroxysmal atrial fibrillation on chronic anticoagulation with Eliquis, CHF, COPD, chronic kidney disease, chronic anemia, etc.  She presents 07/30/23 to ED from home  by EMS for evaluation of rapid heart rate/palpitations and some shortness of breath. Of note she states that about 8 days ago she struck the right lower leg on the outside of her truck and still has a large bruise or blood clot on the side of her leg that she has kept wrapped. She has not had any fever and is able to ambulate but it is very painful for her.    Hospital course / significant events:  10/28: to ED, tachcyardic to 126, Cr up to 4.57 (baseline 2.3), EKG (+)Afib rate 140, then s/p metoprolol converted to NSR. Concern for infected leg hematoma, admitted to hospitalist service. Gen surg consult - no significant concern for infection, no plan for procedures, reasonable to complete abx for possible cellulitis. Nephrolgy consulted 10/29: general surgery advising at least another day monitoring leg though it's looking better today. Holding furosmide.  10/30: leg looking worse, NPO p mn tonight and possible surgical intervention tomorrow. Renal function minimal improvement  10/31:to OR for hematoma/debridement RLE 11/01-11/02: holding anticoag thru the weekend, surgery to reevaluate Monday 11/04  Consultants:  General Surgery  Nephrology    Procedures/Surgeries: 08/02/23 evacuation RLE hematoma w/ excisional debridement skin and devitalized muscle            ASSESSMENT & PLAN:   Atrial fibrillation with rapid ventricular response (HCC) Converted to sinus with IV metoprolol in the ED Continue oral metoprolol holding apixaban pending  surgery reevaluation 11/04   Acute Kidney Injury on chronic kidney disease stage IV with baseline creatinine 2.33 and GFR of 20 on 06/03/23. Likely d/t cardiorenal syndrome  Cr 3.52 today, very gradual improvement  Nephrology following  Ok to continue furosemide 40 mg po bid Monitor BMP   Hypokalemia Replace as needed Monitor BMP   Traumatic hematoma of lower leg without deep infection but with possible mild cellulitis, right, initial encounter S/p evacuation RLE hematoma w/ excisional debridement skin and devitalized muscle  Abx continue w/ ceftriaxone  holding apixaban pending surgery reevaluation 11/04   Chronic diastolic congestive heart failure  No exacerbation - SOB likely d/t AFib RVR EF 60 to 65% 04/2023. Patient short of breath, BNP 293, chest x-ray showing cardiomegaly Continue beta blocker diuretics per nephrology - ok  Daily weights with intake and output monitoring   COPD (chronic obstructive pulmonary disease) (HCC) Not acutely exacerbated Continue home inhalers with DuoNebs as needed   OSA on CPAP CPAP nightly   Hypothyroidism Continue levothyroxine 150 mcg   Elevated troponin Suspect demand ischemia from rapid A-fib Patient denies chest pain and EKG with no ischemic changes Monitor for chest pain, no intervention at this time   Constipation Bowel regimen Miralax + Senna-Docusate    obesity based on BMI: Body mass index is 30.48 kg/m.  Underweight - under 18.5  normal weight - 18.5 to 24.9 overweight - 25 to 29.9 obese - 30 or more     DVT prophylaxis: holding pending possible surgery, cannot reasonable do SCD given leg injury / pain IV fluids:  no continuous IV fluids  Nutrition: cardiac diet  Central lines / invasive devices: none   Code Status: FULL CODE ACP documentation reviewed:  none on file in [facility]   TOC needs: will need SNF rehab  Barriers to dispo / significant pending items: pending surgery reevaluation after the weekend                  Subjective / Brief ROS:  Patient reports concern about preventing constipation, otherwise no complaints   Family Communication: none at this time     Objective Findings:  Vitals:   08/04/23 0059 08/04/23 0355 08/04/23 0848 08/04/23 1226  BP: (!) 149/58 (!) 142/56 (!) 160/58 (!) 157/58  Pulse:   61 (!) 56  Resp: 18 20 16 20   Temp: 98.5 F (36.9 C) 98.6 F (37 C) 98.7 F (37.1 C) 98.2 F (36.8 C)  TempSrc: Oral Oral Oral Oral  SpO2: 96% 94% 94% 96%  Weight:      Height:        Intake/Output Summary (Last 24 hours) at 08/04/2023 1344 Last data filed at 08/04/2023 0500 Gross per 24 hour  Intake 500 ml  Output 600 ml  Net -100 ml   Filed Weights   08/01/23 0334 08/02/23 0406 08/03/23 0500  Weight: 75.6 kg 78.2 kg 78 kg    Examination:  Physical Exam Constitutional:      General: She is not in acute distress. Cardiovascular:     Rate and Rhythm: Normal rate and regular rhythm.  Pulmonary:     Effort: Pulmonary effort is normal.  Skin:    Findings: Ecchymosis: tender, some fluctuance lateral RLE.     Comments: Postop Wrapped RLE was not disturbed   Psychiatric:        Behavior: Behavior normal.          Scheduled Medications:   acetaminophen  1,000 mg Oral Q6H   levothyroxine  150 mcg Oral Daily   metoprolol tartrate  25 mg Oral BID   oxyCODONE  5 mg Oral Q4H   polyethylene glycol  17 g Oral Daily   potassium chloride  10 mEq Oral Daily   senna-docusate  2 tablet Oral QHS    Continuous Infusions:  cefTRIAXone (ROCEPHIN)  IV Stopped (08/03/23 2201)    PRN Medications:  ipratropium-albuterol, morphine injection, ondansetron **OR** ondansetron (ZOFRAN) IV  Antimicrobials from admission:  Anti-infectives (From admission, onward)    Start     Dose/Rate Route Frequency Ordered Stop   07/30/23 2200  cefTRIAXone (ROCEPHIN) 1 g in sodium chloride 0.9 % 100 mL IVPB        1 g 200 mL/hr over 30 Minutes Intravenous Every 24 hours  07/30/23 0249     07/30/23 0115  cefTRIAXone (ROCEPHIN) 1 g in sodium chloride 0.9 % 100 mL IVPB        1 g 200 mL/hr over 30 Minutes Intravenous STAT 07/30/23 0109 07/30/23 0252           Data Reviewed:  I have personally reviewed the following...  CBC: Recent Labs  Lab 07/29/23 2324 07/31/23 0411 08/01/23 0544 08/02/23 0648 08/03/23 0232  WBC 8.9 6.9 8.5 7.6 7.1  NEUTROABS 6.2 3.6 4.0 4.0  --   HGB 8.5* 8.5* 8.9* 8.8* 8.1*  HCT 26.1* 26.5* 27.9* 26.3* 25.0*  MCV 84.7 86.0 84.8 82.7 86.5  PLT 204 204 245 230 231   Basic Metabolic Panel: Recent Labs  Lab 07/29/23 2324 07/30/23 1657 07/31/23 0411 08/01/23 0544 08/02/23  1610 08/03/23 0232  NA 135 138 135 134* 134* 137  K 2.9* 3.6 3.6 3.9 4.0 4.5  CL 100 103 99 99 99 102  CO2 21* 22 24 21* 22 23  GLUCOSE 106* 93 86 97 95 97  BUN 95* 105* 104* 99* 98* 94*  CREATININE 4.57* 4.28* 4.54* 4.35* 3.86* 3.52*  CALCIUM 10.4* 10.2 10.1 9.7 9.8 9.6  MG 1.7  --   --   --   --   --    GFR: Estimated Creatinine Clearance: 11.9 mL/min (A) (by C-G formula based on SCr of 3.52 mg/dL (H)). Liver Function Tests: Recent Labs  Lab 07/29/23 2324 08/03/23 0232  AST 30 41  ALT 19 30  ALKPHOS 84 83  BILITOT 0.9 0.5  PROT 7.8 6.5  ALBUMIN 3.4* 2.8*   No results for input(s): "LIPASE", "AMYLASE" in the last 168 hours. No results for input(s): "AMMONIA" in the last 168 hours. Coagulation Profile: Recent Labs  Lab 07/29/23 2324 08/03/23 0232  INR 1.6* 1.2   Cardiac Enzymes: No results for input(s): "CKTOTAL", "CKMB", "CKMBINDEX", "TROPONINI" in the last 168 hours. BNP (last 3 results) No results for input(s): "PROBNP" in the last 8760 hours. HbA1C: No results for input(s): "HGBA1C" in the last 72 hours. CBG: Recent Labs  Lab 08/02/23 1306  GLUCAP 95   Lipid Profile: No results for input(s): "CHOL", "HDL", "LDLCALC", "TRIG", "CHOLHDL", "LDLDIRECT" in the last 72 hours. Thyroid Function Tests: No results for  input(s): "TSH", "T4TOTAL", "FREET4", "T3FREE", "THYROIDAB" in the last 72 hours. Anemia Panel: No results for input(s): "VITAMINB12", "FOLATE", "FERRITIN", "TIBC", "IRON", "RETICCTPCT" in the last 72 hours. Most Recent Urinalysis On File:     Component Value Date/Time   COLORURINE YELLOW (A) 04/18/2023 1327   APPEARANCEUR TURBID (A) 04/18/2023 1327   APPEARANCEUR Clear 09/17/2018 1028   LABSPEC 1.010 04/18/2023 1327   LABSPEC 1.013 06/20/2012 1508   PHURINE 7.0 04/18/2023 1327   GLUCOSEU NEGATIVE 04/18/2023 1327   GLUCOSEU Negative 06/20/2012 1508   HGBUR SMALL (A) 04/18/2023 1327   BILIRUBINUR NEGATIVE 04/18/2023 1327   BILIRUBINUR Negative 09/17/2018 1028   BILIRUBINUR Negative 06/20/2012 1508   KETONESUR NEGATIVE 04/18/2023 1327   PROTEINUR 100 (A) 04/18/2023 1327   NITRITE NEGATIVE 04/18/2023 1327   LEUKOCYTESUR LARGE (A) 04/18/2023 1327   LEUKOCYTESUR Negative 06/20/2012 1508   Sepsis Labs: @LABRCNTIP (procalcitonin:4,lacticidven:4) Microbiology: No results found for this or any previous visit (from the past 240 hour(s)).    Radiology Studies last 3 days: No results found.         Sunnie Nielsen, DO [facility] 08/04/2023, 1:44 PM    Dictation software may have been used to generate the above note. Typos may occur and escape review in typed/dictated notes. Please contact Dr Lyn Hollingshead directly for clarity if needed.  Staff may message me via secure chat in Epic  but this may not receive an immediate response,  please page me for urgent matters!  If 7PM-7AM, please contact night coverage [email or web address]

## 2023-08-04 NOTE — Plan of Care (Signed)
  Problem: Clinical Measurements: Goal: Will remain free from infection Outcome: Progressing   Problem: Clinical Measurements: Goal: Respiratory complications will improve Outcome: Progressing   Problem: Clinical Measurements: Goal: Cardiovascular complication will be avoided Outcome: Progressing   Problem: Pain Management: Goal: General experience of comfort will improve Outcome: Progressing   Problem: Safety: Goal: Ability to remain free from injury will improve Outcome: Progressing

## 2023-08-04 NOTE — Progress Notes (Signed)
[facility]  PROGRESS NOTE   Subjective:   Patient seen at bedside.  Afebrile today. Ate better today.  Objective:  Vital signs: Blood pressure (!) 157/58, pulse (!) 56, temperature 98.2 F (36.8 C), temperature source Oral, resp. rate 20, height 5\' 2"  (1.575 m), weight 78 kg, SpO2 96%.  Intake/Output Summary (Last 24 hours) at 08/04/2023 1450 Last data filed at 08/04/2023 0500 Gross per 24 hour  Intake 500 ml  Output 600 ml  Net -100 ml   Filed Weights   08/01/23 0334 08/02/23 0406 08/03/23 0500  Weight: 75.6 kg 78.2 kg 78 kg     Physical Exam: General:  No acute distress  Head:  Normocephalic, atraumatic. Moist oral mucosal membranes  Eyes:  Anicteric  Neck:  Supple  Lungs:   Clear to auscultation, normal effort  Heart:  S1S2 no rubs  Abdomen:   Soft, nontender, bowel sounds present  Extremities:  peripheral edema.  Neurologic:  Awake, alert, following commands  Skin:  No lesions  Access:     Basic Metabolic Panel: Recent Labs  Lab 07/29/23 2324 07/30/23 1657 07/31/23 0411 08/01/23 0544 08/02/23 0648 08/03/23 0232  NA 135 138 135 134* 134* 137  K 2.9* 3.6 3.6 3.9 4.0 4.5  CL 100 103 99 99 99 102  CO2 21* 22 24 21* 22 23  GLUCOSE 106* 93 86 97 95 97  BUN 95* 105* 104* 99* 98* 94*  CREATININE 4.57* 4.28* 4.54* 4.35* 3.86* 3.52*  CALCIUM 10.4* 10.2 10.1 9.7 9.8 9.6  MG 1.7  --   --   --   --   --    GFR: Estimated Creatinine Clearance: 11.9 mL/min (A) (by C-G formula based on SCr of 3.52 mg/dL (H)).  Liver Function Tests: Recent Labs  Lab 07/29/23 2324 08/03/23 0232  AST 30 41  ALT 19 30  ALKPHOS 84 83  BILITOT 0.9 0.5  PROT 7.8 6.5  ALBUMIN 3.4* 2.8*   No results for input(s): "LIPASE", "AMYLASE" in the last 168 hours. No results for input(s): "AMMONIA" in the last 168 hours.  CBC: Recent Labs  Lab 07/29/23 2324 07/31/23 0411 08/01/23 0544 08/02/23 0648 08/03/23 0232  WBC 8.9 6.9 8.5 7.6 7.1  NEUTROABS 6.2 3.6 4.0 4.0   --   HGB 8.5* 8.5* 8.9* 8.8* 8.1*  HCT 26.1* 26.5* 27.9* 26.3* 25.0*  MCV 84.7 86.0 84.8 82.7 86.5  PLT 204 204 245 230 231     HbA1C: Hgb A1c MFr Bld  Date/Time Value Ref Range Status  06/14/2022 05:06 AM 6.0 (H) 4.8 - 5.6 % Final    Comment:    (NOTE) Pre diabetes:          5.7%-6.4%  Diabetes:              >6.4%  Glycemic control for   <7.0% adults with diabetes   08/25/2019 06:19 PM 6.4 (H) 4.8 - 5.6 % Final    Comment:    (NOTE) Pre diabetes:          5.7%-6.4% Diabetes:              >6.4% Glycemic control for   <7.0% adults with diabetes     Urinalysis: No results for input(s): "COLORURINE", "LABSPEC", "PHURINE", "GLUCOSEU", "HGBUR", "BILIRUBINUR", "KETONESUR", "PROTEINUR", "UROBILINOGEN", "NITRITE", "LEUKOCYTESUR" in the last 72 hours.  Invalid input(s): "APPERANCEUR"    Imaging: No results found.   Medications:    cefTRIAXone (ROCEPHIN)  IV Stopped (08/03/23 2201)  acetaminophen  1,000 mg Oral Q6H   levothyroxine  150 mcg Oral Daily   metoprolol tartrate  25 mg Oral BID   oxyCODONE  5 mg Oral Q4H   polyethylene glycol  17 g Oral Daily   potassium chloride  10 mEq Oral Daily   senna-docusate  2 tablet Oral QHS    Assessment/ Plan:     82 year old female with history of hypertension, coronary artery disease, congestive heart failure, COPD, chronic kidney disease stage IV now admitted with history of shortness of breath, palpitations found to have hypokalemia.  #1: Acute kidney injury on chronic kidney disease: She has a baseline creatinine of 2.33 with a GFR of 20 cc/min.  Creatinine slowly but steadily improving.  Urine output is marginal.  Will check renal profile tomorrow.  Continue to monitor closely.  #2: Anemia: Anemia most likely secondary to chronic kidney disease.  Needs oral iron supplementation.  #3: Hypokalemia: Continue to supplement potassium.  #4: Hypertension: Continue metoprolol as ordered.  Blood pressure is better  today.   Labs and medications reviewed. Will continue to follow along with you.   LOS: 5 Lorain Childes, MD [facility] 11/2/20242:50 PM

## 2023-08-04 NOTE — Progress Notes (Signed)
PT Cancellation Note  Patient Details Name: [name] MRN: [id number] DOB: [birth year]   Cancelled Treatment:    Reason Eval/Treat Not Completed: Patient declined, no reason specified. TA x2 this afternoon. Pt received in bed and did not agree to PT session. Pt reported that pain in RLE has been increasing as she wished that she had taken her medications earlier when asked. Pt additionally reported feeling lightheaded with mobility and fatigued so wanted to rest her body. Pt states that she has been up today to use BSC and she ate her lunch seated at EOB which all felt okay. Pt is now able to fully plant foot onto ground when WB vs self-PWB by only using her right heel when making contact with the ground. Will re-attempt at another time to further assess mobility.   Rami Waddle [employer] SPT, LAT, ATC  Tayvon Culley [place] 08/04/2023, 3:12 PM

## 2023-08-05 DIAGNOSIS — S8011XD Contusion of right lower leg, subsequent encounter: Secondary | ICD-10-CM | POA: Diagnosis not present

## 2023-08-05 DIAGNOSIS — L089 Local infection of the skin and subcutaneous tissue, unspecified: Secondary | ICD-10-CM | POA: Diagnosis not present

## 2023-08-05 LAB — CBC WITH DIFFERENTIAL/PLATELET
Abs Immature Granulocytes: 0.03 10*3/uL (ref 0.00–0.07)
Basophils Absolute: 0 10*3/uL (ref 0.0–0.1)
Basophils Relative: 1 %
Eosinophils Absolute: 0.1 10*3/uL (ref 0.0–0.5)
Eosinophils Relative: 2 %
HCT: 22.8 % — ABNORMAL LOW (ref 36.0–46.0)
Hemoglobin: 7.3 g/dL — ABNORMAL LOW (ref 12.0–15.0)
Immature Granulocytes: 1 %
Lymphocytes Relative: 38 %
Lymphs Abs: 2.3 10*3/uL (ref 0.7–4.0)
MCH: 27.8 pg (ref 26.0–34.0)
MCHC: 32 g/dL (ref 30.0–36.0)
MCV: 86.7 fL (ref 80.0–100.0)
Monocytes Absolute: 0.5 10*3/uL (ref 0.1–1.0)
Monocytes Relative: 9 %
Neutro Abs: 3 10*3/uL (ref 1.7–7.7)
Neutrophils Relative %: 49 %
Platelets: 242 10*3/uL (ref 150–400)
RBC: 2.63 MIL/uL — ABNORMAL LOW (ref 3.87–5.11)
RDW: 16.2 % — ABNORMAL HIGH (ref 11.5–15.5)
WBC: 6 10*3/uL (ref 4.0–10.5)
nRBC: 0 % (ref 0.0–0.2)

## 2023-08-05 LAB — COMPREHENSIVE METABOLIC PANEL
ALT: 25 U/L (ref 0–44)
AST: 28 U/L (ref 15–41)
Albumin: 2.7 g/dL — ABNORMAL LOW (ref 3.5–5.0)
Alkaline Phosphatase: 75 U/L (ref 38–126)
Anion gap: 9 (ref 5–15)
BUN: 83 mg/dL — ABNORMAL HIGH (ref 8–23)
CO2: 24 mmol/L (ref 22–32)
Calcium: 9.7 mg/dL (ref 8.9–10.3)
Chloride: 106 mmol/L (ref 98–111)
Creatinine, Ser: 3.19 mg/dL — ABNORMAL HIGH (ref 0.44–1.00)
GFR, Estimated: 14 mL/min — ABNORMAL LOW (ref >60)
Glucose, Bld: 96 mg/dL (ref 70–99)
Potassium: 4.6 mmol/L (ref 3.5–5.1)
Sodium: 139 mmol/L (ref 135–145)
Total Bilirubin: 0.4 mg/dL (ref 0.3–1.2)
Total Protein: 6.3 g/dL — ABNORMAL LOW (ref 6.5–8.1)

## 2023-08-05 LAB — GLUCOSE, CAPILLARY: Glucose-Capillary: 94 mg/dL (ref 70–99)

## 2023-08-05 MED ORDER — CALCIUM CARBONATE ANTACID 500 MG PO CHEW: 1.0000 | CHEWABLE_TABLET | Freq: Three times a day (TID) | ORAL | Status: DC | PRN

## 2023-08-05 MED ORDER — PANTOPRAZOLE SODIUM 40 MG PO TBEC
40.0000 mg | DELAYED_RELEASE_TABLET | Freq: Every day | ORAL | Status: DC
  Administered 2023-08-05 – 2023-08-06 (×2): 40 mg via ORAL
  Filled 2023-08-05 (×2): qty 1

## 2023-08-05 NOTE — Plan of Care (Signed)
  Problem: Education: Goal: Ability to demonstrate management of disease process will improve Outcome: Progressing Goal: Ability to verbalize understanding of medication therapies will improve Outcome: Progressing Goal: Individualized Educational Video(s) Outcome: Progressing   Problem: Activity: Goal: Capacity to carry out activities will improve Outcome: Progressing   Problem: Cardiac: Goal: Ability to achieve and maintain adequate cardiopulmonary perfusion will improve Outcome: Progressing   Problem: Education: Goal: Knowledge of General Education information will improve Description: Including pain rating scale, medication(s)/side effects and non-pharmacologic comfort measures Outcome: Progressing   Problem: Health Behavior/Discharge Planning: Goal: Ability to manage health-related needs will improve Outcome: Progressing   Problem: Clinical Measurements: Goal: Ability to maintain clinical measurements within normal limits will improve Outcome: Progressing Goal: Will remain free from infection Outcome: Progressing Goal: Diagnostic test results will improve Outcome: Progressing Goal: Respiratory complications will improve Outcome: Progressing Goal: Cardiovascular complication will be avoided Outcome: Progressing   Problem: Activity: Goal: Risk for activity intolerance will decrease Outcome: Progressing   Problem: Nutrition: Goal: Adequate nutrition will be maintained Outcome: Progressing   Problem: Coping: Goal: Level of anxiety will decrease Outcome: Progressing   Problem: Elimination: Goal: Will not experience complications related to bowel motility Outcome: Progressing Goal: Will not experience complications related to urinary retention Outcome: Progressing   Problem: Pain Management: Goal: General experience of comfort will improve Outcome: Progressing   Problem: Safety: Goal: Ability to remain free from injury will improve Outcome: Progressing    Problem: Skin Integrity: Goal: Risk for impaired skin integrity will decrease Outcome: Progressing

## 2023-08-05 NOTE — Progress Notes (Signed)
PROGRESS NOTE    [name]   VHQ:[id number] DOB: Apr 04, 1941  DOA: 07/29/2023 Date of Service: 08/05/23 which is hospital day 6  PCP: Lauro Regulus, MD       HPI: [name] is a 82 y.o. female with extensive chronic medical issues that include but are not limited to paroxysmal atrial fibrillation on chronic anticoagulation with Eliquis, CHF, COPD, chronic kidney disease, chronic anemia, etc.  [name] presents 07/30/23 to ED from home  by EMS for evaluation of rapid heart rate/palpitations and some shortness of breath. Of note [name] states that about 8 days ago [name] struck the right lower leg on the outside of [name] truck and still has a large bruise or blood clot on the side of [name] leg that [name] has kept wrapped. [name] has not had any fever and is able to ambulate but it is very painful for [name].    Hospital course / significant events:  10/28: to ED, tachcyardic to 126, Cr up to 4.57 (baseline 2.3), EKG (+)Afib rate 140, then s/p metoprolol converted to NSR. Concern for infected leg hematoma, admitted to hospitalist service. Gen surg consult - no significant concern for infection, no plan for procedures, reasonable to complete abx for possible cellulitis. Nephrolgy consulted 10/29: general surgery advising at least another day monitoring leg though it's looking better today. Holding furosmide.  10/30: leg looking worse, NPO p mn tonight and possible surgical intervention tomorrow. Renal function minimal improvement  10/31:to OR for hematoma/debridement RLE 11/01-11/03: holding anticoag thru the weekend, surgery to reevaluate Monday 11/04  Consultants:  General Surgery  Nephrology    Procedures/Surgeries: 08/02/23 evacuation RLE hematoma w/ excisional debridement skin and devitalized muscle            ASSESSMENT & PLAN:   Atrial fibrillation with rapid ventricular response (HCC) Converted to sinus with IV metoprolol in the ED Continue oral metoprolol holding apixaban pending  surgery reevaluation 11/04   Acute Kidney Injury on chronic kidney disease stage IV with baseline creatinine 2.33 and GFR of 20 on 06/03/23. Likely d/t cardiorenal syndrome  Cr 3.52 today, very gradual improvement  Nephrology following  Ok to continue furosemide 40 mg po bid Monitor BMP   Hypokalemia Replace as needed Monitor BMP   Traumatic hematoma of lower leg without deep infection but with possible mild cellulitis, right, initial encounter S/p evacuation RLE hematoma w/ excisional debridement skin and devitalized muscle  Abx continue w/ ceftriaxone  holding apixaban pending surgery reevaluation 11/04   Chronic diastolic congestive heart failure  No exacerbation - SOB likely d/t AFib RVR EF 60 to 65% 04/2023. Patient short of breath, BNP 293, chest x-ray showing cardiomegaly Continue beta blocker diuretics per nephrology - ok  Daily weights with intake and output monitoring   COPD (chronic obstructive pulmonary disease) (HCC) Not acutely exacerbated Continue home inhalers with DuoNebs as needed   OSA on CPAP CPAP nightly   Hypothyroidism Continue levothyroxine 150 mcg   Elevated troponin Suspect demand ischemia from rapid A-fib Patient denies chest pain and EKG with no ischemic changes Monitor for chest pain, no intervention at this time   Constipation Bowel regimen Miralax + Senna-Docusate    GERD Added PPI and Tums   obesity based on BMI: Body mass index is 30.48 kg/m.  Underweight - under 18.5  normal weight - 18.5 to 24.9 overweight - 25 to 29.9 obese - 30 or more     DVT prophylaxis: holding pending possible surgery, cannot reasonable do SCD  given leg injury / pain IV fluids: no continuous IV fluids  Nutrition: cardiac diet  Central lines / invasive devices: none   Code Status: FULL CODE ACP documentation reviewed:  none on file in [facility]CA   TOC needs: will need SNF rehab  Barriers to dispo / significant pending items: pending surgery reevaluation  after the weekend                 Subjective / Brief ROS:  Patient reports concern about preventing constipation, otherwise no complaints   Family Communication: none at this time     Objective Findings:  Vitals:   08/05/23 0400 08/05/23 0424 08/05/23 0850 08/05/23 1205  BP: 137/63  (!) 158/42 (!) 146/44  Pulse: 61  (!) 56 62  Resp:   20 20  Temp: 98 F (36.7 C)  98.3 F (36.8 C) 98.5 F (36.9 C)  TempSrc: Oral  Oral Oral  SpO2:   97% 97%  Weight:  76.8 kg    Height:       No intake or output data in the 24 hours ending 08/05/23 1240  Filed Weights   08/02/23 0406 08/03/23 0500 08/05/23 0424  Weight: 78.2 kg 78 kg 76.8 kg    Examination:  Physical Exam Constitutional:      General: [name]. Cardiovascular:     Rate and Rhythm: Normal rate and regular rhythm.  Pulmonary:     Effort: Pulmonary effort is normal.  Skin:    Findings: Ecchymosis: tender, some fluctuance lateral RLE.     Comments: Postop Wrapped RLE was not disturbed   Neurological:     General: No focal deficit present.     Mental Status: [name].  Psychiatric:        Behavior: Behavior normal.          Scheduled Medications:   acetaminophen  1,000 mg Oral Q6H   levothyroxine  150 mcg Oral Daily   metoprolol tartrate  25 mg Oral BID   oxyCODONE  5 mg Oral Q4H   pantoprazole  40 mg Oral Daily   polyethylene glycol  17 g Oral Daily   potassium chloride  10 mEq Oral Daily   senna-docusate  2 tablet Oral QHS    Continuous Infusions:  cefTRIAXone (ROCEPHIN)  IV 1 g (08/04/23 2219)    PRN Medications:  calcium carbonate, ipratropium-albuterol, morphine injection, ondansetron **OR** ondansetron (ZOFRAN) IV  Antimicrobials from admission:  Anti-infectives (From admission, onward)    Start     Dose/Rate Route Frequency Ordered Stop   07/30/23 2200  cefTRIAXone (ROCEPHIN) 1 g in sodium chloride 0.9 % 100 mL IVPB        1 g 200 mL/hr over 30 Minutes  Intravenous Every 24 hours 07/30/23 0249     07/30/23 0115  cefTRIAXone (ROCEPHIN) 1 g in sodium chloride 0.9 % 100 mL IVPB        1 g 200 mL/hr over 30 Minutes Intravenous STAT 07/30/23 0109 07/30/23 0252           Data Reviewed:  I have personally reviewed the following...  CBC: Recent Labs  Lab 07/29/23 2324 07/31/23 0411 08/01/23 0544 08/02/23 0648 08/03/23 0232 08/05/23 0438  WBC 8.9 6.9 8.5 7.6 7.1 6.0  NEUTROABS 6.2 3.6 4.0 4.0  --  3.0  HGB 8.5* 8.5* 8.9* 8.8* 8.1* 7.3*  HCT 26.1* 26.5* 27.9* 26.3* 25.0* 22.8*  MCV 84.7 86.0 84.8 82.7 86.5 86.7  PLT 204 204  245 230 231 242   Basic Metabolic Panel: Recent Labs  Lab 07/29/23 2324 07/30/23 1657 07/31/23 0411 08/01/23 0544 08/02/23 0648 08/03/23 0232 08/05/23 0438  NA 135   < > 135 134* 134* 137 139  K 2.9*   < > 3.6 3.9 4.0 4.5 4.6  CL 100   < > 99 99 99 102 106  CO2 21*   < > 24 21* 22 23 24   GLUCOSE 106*   < > 86 97 95 97 96  BUN 95*   < > 104* 99* 98* 94* 83*  CREATININE 4.57*   < > 4.54* 4.35* 3.86* 3.52* 3.19*  CALCIUM 10.4*   < > 10.1 9.7 9.8 9.6 9.7  MG 1.7  --   --   --   --   --   --    < > = values in this interval not displayed.   GFR: Estimated Creatinine Clearance: 13.1 mL/min (A) (by C-G formula based on SCr of 3.19 mg/dL (H)). Liver Function Tests: Recent Labs  Lab 07/29/23 2324 08/03/23 0232 08/05/23 0438  AST 30 41 28  ALT 19 30 25   ALKPHOS 84 83 75  BILITOT 0.9 0.5 0.4  PROT 7.8 6.5 6.3*  ALBUMIN 3.4* 2.8* 2.7*   No results for input(s): "LIPASE", "AMYLASE" in the last 168 hours. No results for input(s): "AMMONIA" in the last 168 hours. Coagulation Profile: Recent Labs  Lab 07/29/23 2324 08/03/23 0232  INR 1.6* 1.2   Cardiac Enzymes: No results for input(s): "CKTOTAL", "CKMB", "CKMBINDEX", "TROPONINI" in the last 168 hours. BNP (last 3 results) No results for input(s): "PROBNP" in the last 8760 hours. HbA1C: No results for input(s): "HGBA1C" in the last 72  hours. CBG: Recent Labs  Lab 08/02/23 1306  GLUCAP 95   Lipid Profile: No results for input(s): "CHOL", "HDL", "LDLCALC", "TRIG", "CHOLHDL", "LDLDIRECT" in the last 72 hours. Thyroid Function Tests: No results for input(s): "TSH", "T4TOTAL", "FREET4", "T3FREE", "THYROIDAB" in the last 72 hours. Anemia Panel: No results for input(s): "VITAMINB12", "FOLATE", "FERRITIN", "TIBC", "IRON", "RETICCTPCT" in the last 72 hours. Most Recent Urinalysis On File:     Component Value Date/Time   COLORURINE YELLOW (A) 04/18/2023 1327   APPEARANCEUR TURBID (A) 04/18/2023 1327   APPEARANCEUR Clear 09/17/2018 1028   LABSPEC 1.010 04/18/2023 1327   LABSPEC 1.013 06/20/2012 1508   PHURINE 7.0 04/18/2023 1327   GLUCOSEU NEGATIVE 04/18/2023 1327   GLUCOSEU Negative 06/20/2012 1508   HGBUR SMALL (A) 04/18/2023 1327   BILIRUBINUR NEGATIVE 04/18/2023 1327   BILIRUBINUR Negative 09/17/2018 1028   BILIRUBINUR Negative 06/20/2012 1508   KETONESUR NEGATIVE 04/18/2023 1327   PROTEINUR 100 (A) 04/18/2023 1327   NITRITE NEGATIVE 04/18/2023 1327   LEUKOCYTESUR LARGE (A) 04/18/2023 1327   LEUKOCYTESUR Negative 06/20/2012 1508   Sepsis Labs: @LABRCNTIP (procalcitonin:4,lacticidven:4) Microbiology: No results found for this or any previous visit (from the past 240 hour(s)).    Radiology Studies last 3 days: No results found.         Sunnie Nielsen, DO [facility] 08/05/2023, 12:40 PM    Dictation software may have been used to generate the above note. Typos may occur and escape review in typed/dictated notes. Please contact Dr Lyn Hollingshead directly for clarity if needed.  Staff may message me via secure chat in Epic  but this may not receive an immediate response,  please page me for urgent matters!  If 7PM-7AM, please contact night coverage [email or web address]

## 2023-08-05 NOTE — Evaluation (Signed)
Physical Therapy Re-Evaluation Patient Details Name: [name] MRN: [id number] DOB: 1941-01-10 Today's Date: 08/05/2023  History of Present Illness  Patient is a 82 y.o. female with medical history significant for CKD 4, anemia of CKD, diastolic CHF, HTN, surgical hypothyroidism, gout COPD, OSA on CPAP, diagnosed with A-fib a month ago while hospitalized (8/30 to 9/2) for CHF exacerbation. Current MD assessment: a-fib with RVR, acute on chronic CHF, acute renal failure, traumatic hematoma on RLE, and hypokalemia.   Clinical Impression  Pt received in bed and agreed to re-evaluation PT session. Pt performed bed mobility IND, STS ModI, and amb to [facility] CGA without the use of AD. Pt amb in hallway and back to room with the use of RW (2wheels) CGA with tolerable RLE discomfort. Halfway during amb, pt reported fatigue and requested to return back to bed to complete the session. Pt vitals during session read: 93% Spo2 on RA and 68 bpm while standing. Pt tolerated Tx well today and will continue to benefit from skilled PT sessions to improve activity tolerance, endurance, and functional mobility to maximize safety following D/C.      If plan is discharge home, recommend the following: Assist for transportation;A little help with walking and/or transfers;A little help with bathing/dressing/bathroom;Assistance with cooking/housework;Help with stairs or ramp for entrance   Can travel by private vehicle   Yes    Equipment Recommendations None recommended by PT  Recommendations for Other Services       Functional Status Assessment Patient has had a recent decline in their functional status and demonstrates the ability to make significant improvements in function in a reasonable and predictable amount of time.     Precautions / Restrictions Precautions Precautions: Fall Restrictions Weight Bearing Restrictions: Yes RLE Weight Bearing: Weight bearing as tolerated      Mobility  Bed  Mobility Overal bed mobility: Independent             General bed mobility comments: Pt performed bed mobility IND. Pt did not report any s/sx relative to dizziness while seated EOB.    Transfers Overall transfer level: Modified independent Equipment used: None Transfers: Sit to/from Stand Sit to Stand: Modified independent (Device/Increase time)           General transfer comment: Pt performed STS with SUP. Pt used hands to push off of bed. Pt did not report s/sx relative to dizziness when standing.    Ambulation/Gait Ambulation/Gait assistance: Contact guard assist Gait Distance (Feet): 100 Feet Assistive device: Rolling walker (2 wheels) Gait Pattern/deviations: Step-through pattern, Decreased stance time - right, Decreased step length - right Gait velocity: decreased     General Gait Details: Pt amb in hallway with the use of RW (2wheels) CGA. Pt reported fatigue during amb and returned to room to finish session in bed.  Stairs            Wheelchair Mobility     Tilt Bed    Modified Rankin (Stroke Patients Only)       Balance Overall balance assessment: Needs assistance Sitting-balance support: Feet supported Sitting balance-Leahy Scale: Normal     Standing balance support: Bilateral upper extremity supported, During functional activity Standing balance-Leahy Scale: Good                               Pertinent Vitals/Pain Pain Assessment Pain Assessment: Faces Faces Pain Scale: Hurts a little bit Pain Location: RLE, from knee down Pain  Descriptors / Indicators: Sore, Tender Pain Intervention(s): Monitored during session, Ice applied    Home Living Family/patient expects to be discharged to:: Private residence Living Arrangements: Spouse/significant other Available Help at Discharge: Family Type of Home: House Home Access: Stairs to enter Entrance Stairs-Rails: None Entrance Stairs-Number of Steps: 1 Alternate Level  Stairs-Number of Steps: Flight Home Layout: Two level;Laundry or work area in basement;Able to live on main level with bedroom/bathroom Home Equipment: Agricultural consultant (2 wheels);BSC/3in1;Tub bench;Grab bars - tub/shower;Wheelchair - manual Additional Comments: does not access stairs in home regularly    Prior Function Prior Level of Function : Independent/Modified Independent             Mobility Comments: Limited community amb with RW, endorses recent fall where she hit her LRE on the truck (reason of admission). Pt reports being on and off with PT services at home. ADLs Comments: Pt reports Ind with showering and bathroom use. Notes her niece and husband drive her to doctors appts and grocery stores     Extremity/Trunk Assessment   Upper Extremity Assessment Upper Extremity Assessment: Overall WFL for tasks assessed    Lower Extremity Assessment Lower Extremity Assessment: RLE deficits/detail RLE Deficits / Details: Pain limited, Below knee       Communication   Communication Communication: No apparent difficulties Cueing Techniques: Verbal cues  Cognition Arousal: Alert Behavior During Therapy: WFL for tasks assessed/performed Overall Cognitive Status: Within Functional Limits for tasks assessed                                 General Comments: AOx4. Pt pleasant and willing to participate in PT session.        General Comments      Exercises     Assessment/Plan    PT Assessment Patient needs continued PT services  PT Problem List Decreased activity tolerance;Decreased mobility;Pain       PT Treatment Interventions Gait training;Functional mobility training;Modalities    PT Goals (Current goals can be found in the Care Plan section)  Acute Rehab PT Goals Patient Stated Goal: To go home PT Goal Formulation: With patient Time For Goal Achievement: 08/19/23 Potential to Achieve Goals: Good    Frequency Min 1X/week     Co-evaluation                AM-PAC PT "6 Clicks" Mobility  Outcome Measure Help needed turning from your back to your side while in a flat bed without using bedrails?: None Help needed moving from lying on your back to sitting on the side of a flat bed without using bedrails?: None Help needed moving to and from a bed to a chair (including a wheelchair)?: A Little Help needed standing up from a chair using your arms (e.g., wheelchair or bedside chair)?: A Little Help needed to walk in hospital room?: A Little Help needed climbing 3-5 steps with a railing? : A Lot 6 Click Score: 19    End of Session Equipment Utilized During Treatment: Gait belt Activity Tolerance: Patient tolerated treatment well;Patient limited by pain Patient left: in bed;with call bell/phone within reach Nurse Communication: Mobility status PT Visit Diagnosis: Other abnormalities of gait and mobility (R26.89);Difficulty in walking, not elsewhere classified (R26.2);Unsteadiness on feet (R26.81);Pain Pain - Right/Left: Right Pain - part of body: Leg    Time: 1045-1103 PT Time Calculation (min) (ACUTE ONLY): 18 min   Charges:   PT Evaluation $PT Eval  Low Complexity: 1 Low PT Treatments $Gait Training: 8-22 mins PT General Charges $$ ACUTE PT VISIT: 1 Visit         Youssouf Shipley Sauvignon Howard SPT, LAT, ATC  Yanelly Cantrelle Sauvignon-Howard 08/05/2023, 1:56 PM

## 2023-08-05 NOTE — Progress Notes (Signed)
[facility]  PROGRESS NOTE   Subjective:   Patient feels much better today. Vitals are stable.  She is afebrile today.  Objective:  Vital signs: Blood pressure (!) 146/44, pulse 62, temperature 98.5 F (36.9 C), temperature source Oral, resp. rate 20, height 5\' 2"  (1.575 m), weight 76.8 kg, SpO2 97%. No intake or output data in the 24 hours ending 08/05/23 1453 Filed Weights   08/02/23 0406 08/03/23 0500 08/05/23 0424  Weight: 78.2 kg 78 kg 76.8 kg     Physical Exam: General:  No acute distress  Head:  Normocephalic, atraumatic. Moist oral mucosal membranes  Eyes:  Anicteric  Neck:  Supple  Lungs:   Clear to auscultation, normal effort  Heart:  S1S2 no rubs  Abdomen:   Soft, nontender, bowel sounds present  Extremities:  peripheral edema.  Neurologic:  Awake, alert, following commands  Skin:  No lesions  Access:     Basic Metabolic Panel: Recent Labs  Lab 07/29/23 2324 07/30/23 1657 07/31/23 0411 08/01/23 0544 08/02/23 0648 08/03/23 0232 08/05/23 0438  NA 135   < > 135 134* 134* 137 139  K 2.9*   < > 3.6 3.9 4.0 4.5 4.6  CL 100   < > 99 99 99 102 106  CO2 21*   < > 24 21* 22 23 24   GLUCOSE 106*   < > 86 97 95 97 96  BUN 95*   < > 104* 99* 98* 94* 83*  CREATININE 4.57*   < > 4.54* 4.35* 3.86* 3.52* 3.19*  CALCIUM 10.4*   < > 10.1 9.7 9.8 9.6 9.7  MG 1.7  --   --   --   --   --   --    < > = values in this interval not displayed.   GFR: Estimated Creatinine Clearance: 13.1 mL/min (A) (by C-G formula based on SCr of 3.19 mg/dL (H)).  Liver Function Tests: Recent Labs  Lab 07/29/23 2324 08/03/23 0232 08/05/23 0438  AST 30 41 28  ALT 19 30 25   ALKPHOS 84 83 75  BILITOT 0.9 0.5 0.4  PROT 7.8 6.5 6.3*  ALBUMIN 3.4* 2.8* 2.7*   No results for input(s): "LIPASE", "AMYLASE" in the last 168 hours. No results for input(s): "AMMONIA" in the last 168 hours.  CBC: Recent Labs  Lab 07/29/23 2324 07/31/23 0411 08/01/23 0544 08/02/23 0648  08/03/23 0232 08/05/23 0438  WBC 8.9 6.9 8.5 7.6 7.1 6.0  NEUTROABS 6.2 3.6 4.0 4.0  --  3.0  HGB 8.5* 8.5* 8.9* 8.8* 8.1* 7.3*  HCT 26.1* 26.5* 27.9* 26.3* 25.0* 22.8*  MCV 84.7 86.0 84.8 82.7 86.5 86.7  PLT 204 204 245 230 231 242     HbA1C: Hgb A1c MFr Bld  Date/Time Value Ref Range Status  06/14/2022 05:06 AM 6.0 (H) 4.8 - 5.6 % Final    Comment:    (NOTE) Pre diabetes:          5.7%-6.4%  Diabetes:              >6.4%  Glycemic control for   <7.0% adults with diabetes   08/25/2019 06:19 PM 6.4 (H) 4.8 - 5.6 % Final    Comment:    (NOTE) Pre diabetes:          5.7%-6.4% Diabetes:              >6.4% Glycemic control for   <7.0% adults with diabetes     Urinalysis: No results  for input(s): "COLORURINE", "LABSPEC", "PHURINE", "GLUCOSEU", "HGBUR", "BILIRUBINUR", "KETONESUR", "PROTEINUR", "UROBILINOGEN", "NITRITE", "LEUKOCYTESUR" in the last 72 hours.  Invalid input(s): "APPERANCEUR"    Imaging: No results found.   Medications:    cefTRIAXone (ROCEPHIN)  IV 1 g (08/04/23 2219)    acetaminophen  1,000 mg Oral Q6H   levothyroxine  150 mcg Oral Daily   metoprolol tartrate  25 mg Oral BID   oxyCODONE  5 mg Oral Q4H   pantoprazole  40 mg Oral Daily   polyethylene glycol  17 g Oral Daily   potassium chloride  10 mEq Oral Daily   senna-docusate  2 tablet Oral QHS    Assessment/ Plan:     82 year old female with history of hypertension, coronary artery disease, congestive heart failure, COPD, chronic kidney disease stage IV now admitted with history of shortness of breath, palpitations found to have hypokalemia.   #1: Acute kidney injury on chronic kidney disease: She has a baseline creatinine of 2.33 with a GFR of 20 cc/min.  Creatinine slowly but steadily improving.  Urine output is marginal.  Will check renal profile tomorrow.  Continue to monitor closely.   #2: Anemia: Anemia most likely secondary to chronic kidney disease.  Needs oral iron  supplementation.   #3: Hypokalemia: Continue to supplement potassium.   #4: Hypertension: Continue metoprolol as ordered.  Blood pressure is better today.  #5: COPD/congestive heart failure: Diuretics are on hold at this time.  Can give as needed dose of furosemide IV.  Advised on the importance of fluid restriction.   Labs and medications reviewed. Will continue to follow along with you.   LOS: 6 Lorain Childes, MD [facility] 11/3/20242:53 PM

## 2023-08-05 NOTE — Plan of Care (Signed)
Progressing towards goals

## 2023-08-06 DIAGNOSIS — I4891 Unspecified atrial fibrillation: Secondary | ICD-10-CM | POA: Diagnosis not present

## 2023-08-06 LAB — BASIC METABOLIC PANEL
Anion gap: 10 (ref 5–15)
BUN: 79 mg/dL — ABNORMAL HIGH (ref 8–23)
CO2: 24 mmol/L (ref 22–32)
Calcium: 9.4 mg/dL (ref 8.9–10.3)
Chloride: 102 mmol/L (ref 98–111)
Creatinine, Ser: 3.26 mg/dL — ABNORMAL HIGH (ref 0.44–1.00)
GFR, Estimated: 14 mL/min — ABNORMAL LOW (ref >60)
Glucose, Bld: 88 mg/dL (ref 70–99)
Potassium: 4.9 mmol/L (ref 3.5–5.1)
Sodium: 136 mmol/L (ref 135–145)

## 2023-08-06 MED ORDER — OXYCODONE HCL 5 MG PO TABS: 5.0000 mg | ORAL_TABLET | Freq: Four times a day (QID) | ORAL | 0 refills | Status: DC | PRN

## 2023-08-06 MED ORDER — CALCIUM CARBONATE ANTACID 500 MG PO CHEW: 1.0000 | CHEWABLE_TABLET | Freq: Three times a day (TID) | ORAL | Status: DC | PRN

## 2023-08-06 MED ORDER — CEPHALEXIN 500 MG PO CAPS
500.0000 mg | ORAL_CAPSULE | Freq: Two times a day (BID) | ORAL | 0 refills | Status: AC
Start: 2023-08-07 — End: 2023-08-10

## 2023-08-06 MED ORDER — POLYETHYLENE GLYCOL 3350 17 G PO PACK: 17.0000 g | PACK | Freq: Every day | ORAL | Status: DC | PRN

## 2023-08-06 MED ORDER — SENNOSIDES-DOCUSATE SODIUM 8.6-50 MG PO TABS: 2.0000 | ORAL_TABLET | Freq: Every evening | ORAL | Status: DC | PRN

## 2023-08-06 MED ORDER — AMLODIPINE BESYLATE 5 MG PO TABS: 5.0000 mg | ORAL_TABLET | Freq: Every day | ORAL | Status: DC

## 2023-08-06 NOTE — TOC Transition Note (Signed)
Transition of Care [facility]) - CM/SW Discharge Note   Patient Details  Name: [name] MRN: [id number] Date of Birth: 06/22/1941  Transition of Care [facility]) CM/SW Contact:  Truddie Hidden, RN Phone Number: 08/06/2023, 4:01 PM   Clinical Narrative:    Sherron Monday with Elnita Maxwell from [place] to notify of patient's discharge home today.   TOC signing off.       Barriers to Discharge: Continued Medical Work up   Patient Goals and CMS Choice   Choice offered to / list presented to : Patient  Discharge Placement                         Discharge Plan and Services Additional resources added to the After Visit Summary for       Post Acute Care Choice: Resumption of Svcs/PTA Provider                    HH Arranged: RN, PT [facility] Agency: [facility] Date [facility] Agency Contacted: 07/30/23   Representative spoke with at [facility] Agency: Becky Sax  Social Determinants of Health (SDOH) Interventions SDOH Screenings   Food Insecurity: No Food Insecurity (07/30/2023)  Housing: Low Risk  (07/30/2023)  Transportation Needs: No Transportation Needs (07/30/2023)  Utilities: Not At Risk (07/30/2023)  Financial Resource Strain: Low Risk  (06/12/2023)  Physical Activity: Insufficiently Active (06/12/2023)  Social Connections: Moderately Isolated (06/12/2023)  Stress: No Stress Concern Present (06/12/2023)  Tobacco Use: Low Risk  (07/30/2023)     Readmission Risk Interventions    07/30/2023   10:37 AM 04/19/2023    1:10 PM 03/27/2023   12:56 PM  Readmission Risk Prevention Plan  Transportation Screening Complete Complete Complete  Medication Review Oceanographer) Complete Complete Complete  PCP or Specialist appointment within 3-5 days of discharge Complete  Complete  HRI or Home Care Consult Complete Complete Complete  SW Recovery Care/Counseling Consult Complete Complete Complete  Palliative Care Screening Not Applicable Not Applicable Not Applicable  Skilled Nursing  Facility Not Applicable Not Applicable Not Applicable

## 2023-08-06 NOTE — TOC Progression Note (Signed)
Transition of Care [facility]) - Progression Note    Patient Details  Name: VIVIENNE SANGIOVANNI MRN: [id number] Date of Birth: 09/17/1941  Transition of Care [facility]) CM/SW Contact  Truddie Hidden, RN Phone Number: 08/06/2023, 3:37 PM  Clinical Narrative:    TOC continuing to follow patient's progress throughout discharge planning.   Expected Discharge Plan: Home w Home Health Services Barriers to Discharge: Continued Medical Work up  Expected Discharge Plan and Services     Post Acute Care Choice: Resumption of Svcs/PTA Provider Living arrangements for the past 2 months: Single Family Home                           HH Arranged: RN, PT [facility] Agency: [facility] Date [facility] Agency Contacted: 07/30/23   Representative spoke with at [facility] Agency: Becky Sax   Social Determinants of Health (SDOH) Interventions SDOH Screenings   Food Insecurity: No Food Insecurity (07/30/2023)  Housing: Low Risk  (07/30/2023)  Transportation Needs: No Transportation Needs (07/30/2023)  Utilities: Not At Risk (07/30/2023)  Financial Resource Strain: Low Risk  (06/12/2023)  Physical Activity: Insufficiently Active (06/12/2023)  Social Connections: Moderately Isolated (06/12/2023)  Stress: No Stress Concern Present (06/12/2023)  Tobacco Use: Low Risk  (07/30/2023)    Readmission Risk Interventions    07/30/2023   10:37 AM 04/19/2023    1:10 PM 03/27/2023   12:56 PM  Readmission Risk Prevention Plan  Transportation Screening Complete Complete Complete  Medication Review Oceanographer) Complete Complete Complete  PCP or Specialist appointment within 3-5 days of discharge Complete  Complete  HRI or Home Care Consult Complete Complete Complete  SW Recovery Care/Counseling Consult Complete Complete Complete  Palliative Care Screening Not Applicable Not Applicable Not Applicable  Skilled Nursing Facility Not Applicable Not Applicable Not Applicable

## 2023-08-06 NOTE — Plan of Care (Signed)
  Problem: Education: Goal: Ability to demonstrate management of disease process will improve 08/06/2023 1636 by Lars Pinks, RN Outcome: Progressing 08/06/2023 1447 by Lars Pinks, RN Outcome: Progressing Goal: Ability to verbalize understanding of medication therapies will improve 08/06/2023 1636 by Lars Pinks, RN Outcome: Progressing 08/06/2023 1447 by Lars Pinks, RN Outcome: Progressing Goal: Individualized Educational Video(s) 08/06/2023 1636 by Lars Pinks, RN Outcome: Progressing 08/06/2023 1447 by Lars Pinks, RN Outcome: Progressing   Problem: Activity: Goal: Capacity to carry out activities will improve 08/06/2023 1636 by Lars Pinks, RN Outcome: Progressing 08/06/2023 1447 by Lars Pinks, RN Outcome: Progressing   Problem: Cardiac: Goal: Ability to achieve and maintain adequate cardiopulmonary perfusion will improve 08/06/2023 1636 by Lars Pinks, RN Outcome: Progressing 08/06/2023 1447 by Lars Pinks, RN Outcome: Progressing   Problem: Education: Goal: Knowledge of General Education information will improve Description: Including pain rating scale, medication(s)/side effects and non-pharmacologic comfort measures 08/06/2023 1636 by Lars Pinks, RN Outcome: Progressing 08/06/2023 1447 by Lars Pinks, RN Outcome: Progressing   Problem: Health Behavior/Discharge Planning: Goal: Ability to manage health-related needs will improve 08/06/2023 1636 by Lars Pinks, RN Outcome: Progressing 08/06/2023 1447 by Lars Pinks, RN Outcome: Progressing   Problem: Clinical Measurements: Goal: Ability to maintain clinical measurements within normal limits will improve 08/06/2023 1636 by Lars Pinks, RN Outcome: Progressing 08/06/2023 1447 by Lars Pinks, RN Outcome: Progressing Goal: Will remain free from infection 08/06/2023 1636 by Lars Pinks, RN Outcome: Progressing 08/06/2023 1447 by Lars Pinks, RN Outcome: Progressing Goal: Diagnostic test results will improve 08/06/2023 1636 by Lars Pinks, RN Outcome: Progressing 08/06/2023 1447 by Lars Pinks, RN Outcome: Progressing Goal: Respiratory complications will improve 08/06/2023 1636 by Lars Pinks, RN Outcome: Progressing 08/06/2023 1447 by Lars Pinks, RN Outcome: Progressing Goal: Cardiovascular complication will be avoided 08/06/2023 1636 by Lars Pinks, RN Outcome: Progressing 08/06/2023 1447 by Lars Pinks, RN Outcome: Progressing   Problem: Activity: Goal: Risk for activity intolerance will decrease 08/06/2023 1636 by Lars Pinks, RN Outcome: Progressing 08/06/2023 1447 by Lars Pinks, RN Outcome: Progressing   Problem: Nutrition: Goal: Adequate nutrition will be maintained 08/06/2023 1636 by Lars Pinks, RN Outcome: Progressing 08/06/2023 1447 by Lars Pinks, RN Outcome: Progressing   Problem: Coping: Goal: Level of anxiety will decrease 08/06/2023 1636 by Lars Pinks, RN Outcome: Progressing 08/06/2023 1447 by Lars Pinks, RN Outcome: Progressing   Problem: Elimination: Goal: Will not experience complications related to bowel motility 08/06/2023 1636 by Lars Pinks, RN Outcome: Progressing 08/06/2023 1447 by Lars Pinks, RN Outcome: Progressing Goal: Will not experience complications related to urinary retention 08/06/2023 1636 by Lars Pinks, RN Outcome: Progressing 08/06/2023 1447 by Lars Pinks, RN Outcome: Progressing   Problem: Pain Management: Goal: General experience of comfort will improve 08/06/2023 1636 by Lars Pinks, RN Outcome: Progressing 08/06/2023 1447 by Lars Pinks, RN Outcome: Progressing   Problem: Safety: Goal: Ability to remain free from injury will improve 08/06/2023 1636 by Lars Pinks, RN Outcome: Progressing 08/06/2023 1447 by Lars Pinks, RN Outcome: Progressing    Problem: Skin Integrity: Goal: Risk for impaired skin integrity will decrease 08/06/2023 1636 by Lars Pinks, RN Outcome: Progressing 08/06/2023 1447 by Lars Pinks, RN Outcome: Progressing

## 2023-08-06 NOTE — Discharge Summary (Signed)
Physician Discharge Summary   Patient: [name] MRN: [id number]  DOB: Dec 20, 1940   Admit:     Date of Admission: 07/29/2023 Admitted from: home   Discharge: Date of discharge: 08/06/23 Disposition: Home health Condition at discharge: good  CODE STATUS: FULL CODE     Discharge Physician: Sunnie Nielsen, DO [facility]     PCP: Lauro Regulus, MD  Recommendations for Outpatient Follow-up:  Follow up with PCP Lauro Regulus, MD in 1-2 weeks Follow up w/ general surgery [facility] as scheduled  Please obtain labs/tests: BMP in 2-3 days if possible w/ home health RN, otherwise w/ PCP asap / within 1 week     Discharge Instructions     Diet - low sodium heart healthy   Complete by: As directed    Discharge wound care:   Complete by: As directed    Place non-adherent dressing superficially in wound bed, can transition to Aquacel Ag as well which can be changed every few days. Wrap with Kerlix and Coban. Dressing can be changed every few days as needed.   Increase activity slowly   Complete by: As directed          Discharge Diagnoses: Principal Problem:   Atrial fibrillation with rapid ventricular response (HCC) Active Problems:   Acute on chronic diastolic congestive heart failure (HCC)   Hypokalemia   Elevated troponin   Acute renal failure superimposed on stage 4 chronic kidney disease (HCC)   Traumatic hematoma of lower leg with infection, right, subsequent encounter   OSA on CPAP   COPD (chronic obstructive pulmonary disease) (HCC)   Hypothyroidism   Anemia in chronic kidney disease          HPI: [name] is a 82 y.o. female with extensive chronic medical issues that include but are not limited to paroxysmal atrial fibrillation on chronic anticoagulation with Eliquis, CHF, COPD, chronic kidney disease, chronic anemia, etc.  She presents 07/30/23 to ED from home  by EMS for evaluation of rapid heart rate/palpitations  and some shortness of breath. Of note she states that about 8 days ago she struck the right lower leg on the outside of her truck and still has a large bruise or blood clot on the side of her leg that she has kept wrapped. She has not had any fever and is able to ambulate but it is very painful for her.    Hospital course / significant events:  10/28: to ED, tachcyardic to 126, Cr up to 4.57 (baseline 2.3), EKG (+)Afib rate 140, then s/p metoprolol converted to NSR. Concern for infected leg hematoma, admitted to hospitalist service. Gen surg consult - no significant concern for infection, no plan for procedures, reasonable to complete abx for possible cellulitis. Nephrolgy consulted 10/29: general surgery advising at least another day monitoring leg though it's looking better today. Holding furosmide.  10/30: leg looking worse, NPO p mn tonight and possible surgical intervention tomorrow. Renal function minimal improvement  10/31:to OR for hematoma/debridement RLE 11/01-11/03: holding anticoag thru the weekend, surgery to reevaluate Monday 11/04 11/04: wound healing, does not appear infected, wound care as below, general surgery has cleared for discharge home, home health is confirmed   Consultants:  General Surgery  Nephrology    Procedures/Surgeries: 08/02/23 evacuation RLE hematoma w/ excisional debridement skin and devitalized muscle            ASSESSMENT & PLAN:   Atrial fibrillation with rapid ventricular response -  resolved Continue oral metoprolol holding apixaban pending surgery reevaluation 11/04 --> can restart tomorrow    Acute Kidney Injury on chronic kidney disease stage IV with baseline creatinine 2.33 and GFR of 20 on 06/03/23. Likely d/t cardiorenal syndrome  Cr 3.26 today, very gradual improvement  Nephrology following  Ok to continue furosemide 40 mg po bid Monitor BMP outpatient    Hypokalemia Replace as needed Monitor BMP   Traumatic hematoma of lower leg  without deep infection but with possible mild cellulitis, right, initial encounter S/p evacuation RLE hematoma w/ excisional debridement skin and devitalized muscle  Abx continue w/ ceftriaxone --> keflex on discharge to complete 10 days total    Chronic diastolic congestive heart failure  No exacerbation - SOB likely d/t AFib RVR EF 60 to 65% 04/2023. Patient short of breath, BNP 293, chest x-ray showing cardiomegaly Continue beta blocker diuretics per nephrology - ok  Daily weights    COPD (chronic obstructive pulmonary disease) (HCC) Not acutely exacerbated Continue home inhalers with DuoNebs as needed   OSA on CPAP CPAP nightly   Hypothyroidism Continue levothyroxine 150 mcg   Elevated troponin Suspect demand ischemia from rapid A-fib Patient denies chest pain and EKG with no ischemic changes Monitor for chest pain, no intervention at this time   Constipation Bowel regimen Miralax + Senna-Docusate    GERD PPI daily  Tums prn  obesity based on BMI: Body mass index is 30.48 kg/m.  Underweight - under 18.5  normal weight - 18.5 to 24.9 overweight - 25 to 29.9 obese - 30 or more            Discharge Instructions  Allergies as of 08/06/2023       Reactions   Ace Inhibitors    Other reaction(s): Unknown   Egg-derived Products Diarrhea   Other    Other reaction(s): Other (See Comments) Eggs   Prednisone    Other reaction(s): Other (See Comments) joint pain   Risedronate    Other reaction(s): Other (See Comments)   Sulfa Antibiotics Itching, Swelling   Other reaction(s): Other (See Comments)   Sulfasalazine Other (See Comments)        Medication List     STOP taking these medications    allopurinol 100 MG tablet Commonly known as: ZYLOPRIM   azelastine 0.1 % nasal spray Commonly known as: ASTELIN   colchicine 0.6 MG tablet       TAKE these medications    acetaminophen 500 MG tablet Commonly known as: TYLENOL Take 1,000 mg by mouth  every 6 (six) hours as needed for mild pain or moderate pain.   albuterol 108 (90 Base) MCG/ACT inhaler Commonly known as: VENTOLIN HFA Inhale 2 puffs into the lungs every 6 (six) hours as needed for wheezing or shortness of breath.   amLODipine 5 MG tablet Commonly known as: NORVASC Take 1 tablet (5 mg total) by mouth daily. What changed: when to take this   apixaban 2.5 MG Tabs tablet Commonly known as: ELIQUIS Take 1 tablet (2.5 mg total) by mouth 2 (two) times daily.   CALCIUM & VIT D3 BONE HEALTH PO Take 1 tablet by mouth daily. 600 mg/ 25 mg   calcium carbonate 500 MG chewable tablet Commonly known as: TUMS - dosed in mg elemental calcium Chew 1-2 tablets (200-400 mg of elemental calcium total) by mouth 3 (three) times daily as needed for indigestion or heartburn.   cephALEXin 500 MG capsule Commonly known as: KEFLEX Take 1 capsule (500 mg  total) by mouth 2 (two) times daily for 3 days. Start taking on: August 07, 2023   cyanocobalamin 500 MCG tablet Commonly known as: VITAMIN B12 Take 1 tablet (500 mcg total) by mouth daily.   esomeprazole 40 MG capsule Commonly known as: NEXIUM Take 40 mg by mouth daily before breakfast.   fluticasone 50 MCG/ACT nasal spray Commonly known as: FLONASE Place 2 sprays into both nostrils daily.   fluticasone-salmeterol 500-50 MCG/ACT Aepb Commonly known as: ADVAIR Inhale 1 puff into the lungs in the morning and at bedtime.   furosemide 40 MG tablet Commonly known as: LASIX Take 1 tablet (40 mg total) by mouth 2 (two) times daily.   hydrALAZINE 100 MG tablet Commonly known as: APRESOLINE Take 1 tablet (100 mg total) by mouth 3 (three) times daily.   ipratropium-albuterol 0.5-2.5 (3) MG/3ML Soln Commonly known as: DUONEB Take 3 mLs by nebulization every 6 (six) hours as needed (SOB).   levothyroxine 150 MCG tablet Commonly known as: SYNTHROID Take 150 mcg by mouth daily.   lidocaine 5 % Commonly known as: Lidoderm Place  1 patch onto the skin every 12 (twelve) hours. Remove & Discard patch within 12 hours or as directed by MD   loperamide 2 MG capsule Commonly known as: IMODIUM Take 1 capsule (2 mg total) by mouth as needed for diarrhea or loose stools.   metoprolol tartrate 50 MG tablet Commonly known as: LOPRESSOR Take 25 mg by mouth 2 (two) times daily.   montelukast 10 MG tablet Commonly known as: SINGULAIR Take 10 mg by mouth at bedtime.   oxyCODONE 5 MG immediate release tablet Commonly known as: Oxy IR/ROXICODONE Take 1 tablet (5 mg total) by mouth every 6 (six) hours as needed for severe pain (pain score 7-10).   polyethylene glycol 17 g packet Commonly known as: MIRALAX / GLYCOLAX Take 17 g by mouth daily as needed for mild constipation or moderate constipation.   potassium chloride 10 MEQ tablet Commonly known as: KLOR-CON Take 10 mEq by mouth daily.   rosuvastatin 40 MG tablet Commonly known as: CRESTOR Take 40 mg by mouth daily.   senna-docusate 8.6-50 MG tablet Commonly known as: Senokot-S Take 2 tablets by mouth at bedtime as needed for mild constipation.   sertraline 100 MG tablet Commonly known as: ZOLOFT Take 100 mg by mouth daily.   Vitamin D (Ergocalciferol) 1.25 MG (50000 UNIT) Caps capsule Commonly known as: DRISDOL Take 1 capsule (50,000 Units total) by mouth every 7 (seven) days.               Discharge Care Instructions  (From admission, onward)           Start     Ordered   08/06/23 0000  Discharge wound care:       Comments: Place non-adherent dressing superficially in wound bed, can transition to Aquacel Ag as well which can be changed every few days. Wrap with Kerlix and Coban. Dressing can be changed every few days as needed.   08/06/23 1618             Follow-up Information     Donovan Kail, PA-C Follow up on 08/15/2023.   Specialty: Physician Assistant Contact information: [address] [place]  [address] [phone]         Lauro Regulus, MD. Schedule an appointment as soon as possible for a visit.   Specialty: Internal Medicine Why: hospital follow up as soon as possible - monitor kidney  function and blood pressure, ensure specialsit follow ups w/ general surgery and w/ nephrology if renal function not improving Contact information: [address] [place] [address] [phone]                 Allergies  Allergen Reactions   Ace Inhibitors     Other reaction(s): Unknown   Egg-Derived Products Diarrhea   Other     Other reaction(s): Other (See Comments) Eggs   Prednisone     Other reaction(s): Other (See Comments) joint pain   Risedronate     Other reaction(s): Other (See Comments)   Sulfa Antibiotics Itching and Swelling    Other reaction(s): Other (See Comments)   Sulfasalazine Other (See Comments)     Subjective: pt reports no concerns today, pain is controlled, asks about home health resources and requests previous company, otherwise no complaints    Discharge Exam: BP (!) 169/48 (BP Location: Right Arm)   Pulse (!) 58   Temp 98.6 F (37 C) (Oral)   Resp 20   Ht 5\' 2"  (1.575 m)   Wt 73.3 kg   SpO2 97%   BMI 29.56 kg/m  General: Pt is alert, awake, not in acute distress Cardiovascular: irreg/irreg, rate normal, no rubs, no gallops Respiratory: CTA bilaterally, no wheezing, no rhonchi Abdominal: Soft, NT, ND, bowel sounds + Extremities: trace bilateral lower extremity edema, no cyanosis - see photos lateral RLE below          The results of significant diagnostics from this hospitalization (including imaging, microbiology, ancillary and laboratory) are listed below for reference.     Microbiology: No results found for this or any previous visit (from the past 240 hour(s)).   Labs: BNP (last 3 results) Recent Labs    04/16/23 1700 06/01/23 1300 07/29/23 2324  BNP 273.4* 558.8* 293.4*   Basic Metabolic  Panel: Recent Labs  Lab 08/01/23 0544 08/02/23 0648 08/03/23 0232 08/05/23 0438 08/06/23 0420  NA 134* 134* 137 139 136  K 3.9 4.0 4.5 4.6 4.9  CL 99 99 102 106 102  CO2 21* 22 23 24 24   GLUCOSE 97 95 97 96 88  BUN 99* 98* 94* 83* 79*  CREATININE 4.35* 3.86* 3.52* 3.19* 3.26*  CALCIUM 9.7 9.8 9.6 9.7 9.4   Liver Function Tests: Recent Labs  Lab 08/03/23 0232 08/05/23 0438  AST 41 28  ALT 30 25  ALKPHOS 83 75  BILITOT 0.5 0.4  PROT 6.5 6.3*  ALBUMIN 2.8* 2.7*   No results for input(s): "LIPASE", "AMYLASE" in the last 168 hours. No results for input(s): "AMMONIA" in the last 168 hours. CBC: Recent Labs  Lab 07/31/23 0411 08/01/23 0544 08/02/23 0648 08/03/23 0232 08/05/23 0438  WBC 6.9 8.5 7.6 7.1 6.0  NEUTROABS 3.6 4.0 4.0  --  3.0  HGB 8.5* 8.9* 8.8* 8.1* 7.3*  HCT 26.5* 27.9* 26.3* 25.0* 22.8*  MCV 86.0 84.8 82.7 86.5 86.7  PLT 204 245 230 231 242   Cardiac Enzymes: No results for input(s): "CKTOTAL", "CKMB", "CKMBINDEX", "TROPONINI" in the last 168 hours. BNP: Invalid input(s): "POCBNP" CBG: Recent Labs  Lab 08/02/23 1306 08/05/23 1539  GLUCAP 95 94   D-Dimer No results for input(s): "DDIMER" in the last 72 hours. Hgb A1c No results for input(s): "HGBA1C" in the last 72 hours. Lipid Profile No results for input(s): "CHOL", "HDL", "LDLCALC", "TRIG", "CHOLHDL", "LDLDIRECT" in the last 72 hours. Thyroid function studies No results for input(s): "TSH", "T4TOTAL", "T3FREE", "THYROIDAB" in the  last 72 hours.  Invalid input(s): "FREET3" Anemia work up No results for input(s): "VITAMINB12", "FOLATE", "FERRITIN", "TIBC", "IRON", "RETICCTPCT" in the last 72 hours. Urinalysis    Component Value Date/Time   COLORURINE YELLOW (A) 04/18/2023 1327   APPEARANCEUR TURBID (A) 04/18/2023 1327   APPEARANCEUR Clear 09/17/2018 1028   LABSPEC 1.010 04/18/2023 1327   LABSPEC 1.013 06/20/2012 1508   PHURINE 7.0 04/18/2023 1327   GLUCOSEU NEGATIVE 04/18/2023 1327    GLUCOSEU Negative 06/20/2012 1508   HGBUR SMALL (A) 04/18/2023 1327   BILIRUBINUR NEGATIVE 04/18/2023 1327   BILIRUBINUR Negative 09/17/2018 1028   BILIRUBINUR Negative 06/20/2012 1508   KETONESUR NEGATIVE 04/18/2023 1327   PROTEINUR 100 (A) 04/18/2023 1327   NITRITE NEGATIVE 04/18/2023 1327   LEUKOCYTESUR LARGE (A) 04/18/2023 1327   LEUKOCYTESUR Negative 06/20/2012 1508   Sepsis Labs Recent Labs  Lab 08/01/23 0544 08/02/23 0648 08/03/23 0232 08/05/23 0438  WBC 8.5 7.6 7.1 6.0   Microbiology No results found for this or any previous visit (from the past 240 hour(s)). Imaging DG Tibia/Fibula Right  Result Date: 07/30/2023 CLINICAL DATA:  Pain and swelling EXAM: RIGHT TIBIA AND FIBULA - 2 VIEW COMPARISON:  None Available. FINDINGS: No fracture or malalignment. Vascular calcifications. Protuberant soft tissue swelling over the mid to distal anterior lower leg. IMPRESSION: No acute osseous abnormality. Protuberant soft tissue swelling over the mid to distal anterior lower leg. Electronically Signed   By: Jasmine Pang M.D.   On: 07/30/2023 01:20   DG Chest Portable 1 View  Result Date: 07/30/2023 CLINICAL DATA:  Dyspnea EXAM: PORTABLE CHEST 1 VIEW COMPARISON:  06/01/2023 FINDINGS: Cardiomegaly. No acute airspace disease, pleural effusion, or pneumothorax. IMPRESSION: No active disease. Cardiomegaly. Electronically Signed   By: Jasmine Pang M.D.   On: 07/30/2023 00:29      Time coordinating discharge: over 30 minutes  SIGNED:  Sunnie Nielsen DO [facility]

## 2023-08-06 NOTE — Plan of Care (Signed)
  Problem: Education: Goal: Ability to demonstrate management of disease process will improve Outcome: Progressing Goal: Ability to verbalize understanding of medication therapies will improve Outcome: Progressing Goal: Individualized Educational Video(s) Outcome: Progressing   Problem: Activity: Goal: Capacity to carry out activities will improve Outcome: Progressing   Problem: Cardiac: Goal: Ability to achieve and maintain adequate cardiopulmonary perfusion will improve Outcome: Progressing   Problem: Education: Goal: Knowledge of General Education information will improve Description: Including pain rating scale, medication(s)/side effects and non-pharmacologic comfort measures Outcome: Progressing   Problem: Health Behavior/Discharge Planning: Goal: Ability to manage health-related needs will improve Outcome: Progressing   Problem: Clinical Measurements: Goal: Ability to maintain clinical measurements within normal limits will improve Outcome: Progressing Goal: Will remain free from infection Outcome: Progressing Goal: Diagnostic test results will improve Outcome: Progressing Goal: Respiratory complications will improve Outcome: Progressing Goal: Cardiovascular complication will be avoided Outcome: Progressing   Problem: Activity: Goal: Risk for activity intolerance will decrease Outcome: Progressing   Problem: Nutrition: Goal: Adequate nutrition will be maintained Outcome: Progressing   Problem: Coping: Goal: Level of anxiety will decrease Outcome: Progressing   Problem: Elimination: Goal: Will not experience complications related to bowel motility Outcome: Progressing Goal: Will not experience complications related to urinary retention Outcome: Progressing   Problem: Pain Management: Goal: General experience of comfort will improve Outcome: Progressing   Problem: Safety: Goal: Ability to remain free from injury will improve Outcome: Progressing    Problem: Skin Integrity: Goal: Risk for impaired skin integrity will decrease Outcome: Progressing

## 2023-08-06 NOTE — Progress Notes (Signed)
[facility] SURGICAL PROGRESS NOTE  Hospital Day(s): 7.   Post op day(s): 4 Days Post-Op.   Interval History:  Patient seen and examined No acute events or new complaints overnight.  Patient reports she is doing well Pain in RLE improved No fever, chills She is ambulating better   Vital signs in last 24 hours: [min-max] current  Temp:  [98 F (36.7 C)-98.7 F (37.1 C)] 98 F (36.7 C) (11/04 0314) Pulse Rate:  [54-62] 55 (11/04 0314) Resp:  [20-23] 23 (11/04 0314) BP: (138-167)/(34-52) 167/52 (11/04 0314) SpO2:  [96 %-100 %] 98 % (11/04 0314) Weight:  [73.3 kg] 73.3 kg (11/04 0314)     Height: 5\' 2"  (157.5 cm) Weight: 73.3 kg BMI (Calculated): 29.55   Intake/Output last 2 shifts:  11/03 0701 - 11/04 0700 In: 440 [P.O.:240; IV Piggyback:200] Out: -    Physical Exam:  Constitutional: alert, cooperative and no distress  Respiratory: breathing non-labored at rest  Cardiovascular: regular rate and sinus rhythm  Integumentary / Musculoskeletal: Irregular shaped wound to right lower extremity, wound bed appears healthy with old blood, no active bleeding, ecchymosis improved. Dressing replaced; NVI distally.   RLE Wound (08/06/2023):    Labs:     Latest Ref Rng & Units 08/05/2023    4:38 AM 08/03/2023    2:32 AM 08/02/2023    6:48 AM  CBC  WBC 4.0 - 10.5 K/uL 6.0  7.1  7.6   Hemoglobin 12.0 - 15.0 g/dL 7.3  8.1  8.8   Hematocrit 36.0 - 46.0 % 22.8  25.0  26.3   Platelets 150 - 400 K/uL 242  231  230       Latest Ref Rng & Units 08/06/2023    4:20 AM 08/05/2023    4:38 AM 08/03/2023    2:32 AM  CMP  Glucose 70 - 99 mg/dL 88  96  97   BUN 8 - 23 mg/dL 79  83  94   Creatinine 0.44 - 1.00 mg/dL 1.61  0.96  0.45   Sodium 135 - 145 mmol/L 136  139  137   Potassium 3.5 - 5.1 mmol/L 4.9  4.6  4.5   Chloride 98 - 111 mmol/L 102  106  102   CO2 22 - 32 mmol/L 24  24  23    Calcium 8.9 - 10.3 mg/dL 9.4  9.7  9.6   Total Protein 6.5 - 8.1 g/dL  6.3  6.5    Total Bilirubin 0.3 - 1.2 mg/dL  0.4  0.5   Alkaline Phos 38 - 126 U/L  75  83   AST 15 - 41 U/L  28  41   ALT 0 - 44 U/L  25  30     Imaging studies: No new pertinent imaging studies   Assessment/Plan:  82 y.o. female with traumatic right lower leg hematoma with possible cellulitis without abscess, complicated by pertinent comorbidities including including Afib with RVR requiring anticoagulation, CHF, CKD, HTN.    - Wound Care: Place non-adherent dressing superficially in wound bed, can transition to Aquacel Ag as well which can be changed every few days. This was wrapped with Kerlix and Coban. Dressing can be changed every few days as needed.   - Further management per primary service   - Okay for discharge from surgical perspective. I will be happy to follow up with her in ~1 week for wound check   All of the above findings and recommendations were discussed with the  patient, patient's family (husband at bedside), and the medical team, and all of their questions were answered to their expressed satisfaction.  -- Lynden Oxford, PA-C [facility] 08/06/2023, 7:47 AM M-F: 7am - 4pm

## 2023-08-06 NOTE — Progress Notes (Signed)
[facility]  ROUNDING NOTE   Subjective:   [name] IS A 82 y.o. female with past medical conditions including diastolic heart failure, hypertension, hypothyroidism, COPD, and chronic kidney disease stage IV. Patient reports to ED with RLE pain. She has been admitted for Hypokalemia [E87.6] Elevated troponin level [R79.89] Demand ischemia (HCC) [I24.89] Chronic anemia [D64.9] Atrial fibrillation with rapid ventricular response (HCC) [I48.91] Atrial fibrillation with RVR (HCC) [I48.91] Current use of long term anticoagulation [Z79.01] Traumatic hematoma of lower leg with infection, right, subsequent encounter [S80.11XD, L08.9] Acute renal failure superimposed on chronic kidney disease, unspecified acute renal failure type, unspecified CKD stage (HCC) [N17.9, N18.9]  Patient sitting up in bed Completed breakfast tray at bedside Alert and oriented No family present States she feels well No complaints to offer  Creatinine 3.26   Objective:  Vital signs in last 24 hours:  Temp:  [98 F (36.7 C)-98.7 F (37.1 C)] 98.6 F (37 C) (11/04 1231) Pulse Rate:  [54-58] 58 (11/04 1231) Resp:  [20-23] 20 (11/04 1231) BP: (138-189)/(34-52) 169/48 (11/04 1231) SpO2:  [95 %-100 %] 97 % (11/04 1231) Weight:  [73.3 kg] 73.3 kg (11/04 0314)  Weight change: -3.5 kg Filed Weights   08/03/23 0500 08/05/23 0424 08/06/23 0314  Weight: 78 kg 76.8 kg 73.3 kg    Intake/Output: I/O last 3 completed shifts: In: 440 [P.O.:240; IV Piggyback:200] Out: -    Intake/Output this shift:  No intake/output data recorded.  Physical Exam: General: NAD  Head: Normocephalic, atraumatic. Moist oral mucosal membranes  Eyes: Anicteric  Lungs:  Clear to auscultation, room air  Heart: Regular rate and rhythm  Abdomen:  Soft, nontender  Extremities:  trace peripheral edema.RLE bandage  Neurologic: Alert, moving all four extremities  Skin: RLE abrasions,bandaged       Basic Metabolic  Panel: Recent Labs  Lab 08/01/23 0544 08/02/23 0648 08/03/23 0232 08/05/23 0438 08/06/23 0420  NA 134* 134* 137 139 136  K 3.9 4.0 4.5 4.6 4.9  CL 99 99 102 106 102  CO2 21* 22 23 24 24   GLUCOSE 97 95 97 96 88  BUN 99* 98* 94* 83* 79*  CREATININE 4.35* 3.86* 3.52* 3.19* 3.26*  CALCIUM 9.7 9.8 9.6 9.7 9.4    Liver Function Tests: Recent Labs  Lab 08/03/23 0232 08/05/23 0438  AST 41 28  ALT 30 25  ALKPHOS 83 75  BILITOT 0.5 0.4  PROT 6.5 6.3*  ALBUMIN 2.8* 2.7*   No results for input(s): "LIPASE", "AMYLASE" in the last 168 hours. No results for input(s): "AMMONIA" in the last 168 hours.  CBC: Recent Labs  Lab 07/31/23 0411 08/01/23 0544 08/02/23 0648 08/03/23 0232 08/05/23 0438  WBC 6.9 8.5 7.6 7.1 6.0  NEUTROABS 3.6 4.0 4.0  --  3.0  HGB 8.5* 8.9* 8.8* 8.1* 7.3*  HCT 26.5* 27.9* 26.3* 25.0* 22.8*  MCV 86.0 84.8 82.7 86.5 86.7  PLT 204 245 230 231 242    Cardiac Enzymes: No results for input(s): "CKTOTAL", "CKMB", "CKMBINDEX", "TROPONINI" in the last 168 hours.  BNP: Invalid input(s): "POCBNP"  CBG: Recent Labs  Lab 08/02/23 1306 08/05/23 1539  GLUCAP 95 94    Microbiology: Results for orders placed or performed during the hospital encounter of 04/16/23  SARS Coronavirus 2 by RT PCR (hospital order, performed in [facility] hospital lab) *cepheid single result test* Anterior Nasal Swab     Status: None   Collection Time: 04/16/23  2:10 PM   Specimen: Anterior Nasal  Swab  Result Value Ref Range Status   SARS Coronavirus 2 by RT PCR NEGATIVE NEGATIVE Final    Comment: (NOTE) SARS-CoV-2 target nucleic acids are NOT DETECTED.  The SARS-CoV-2 RNA is generally detectable in upper and lower respiratory specimens during the acute phase of infection. The lowest concentration of SARS-CoV-2 viral copies this assay can detect is 250 copies / mL. A negative result does not preclude SARS-CoV-2 infection and should not be used as the sole basis for  treatment or other patient management decisions.  A negative result may occur with improper specimen collection / handling, submission of specimen other than nasopharyngeal swab, presence of viral mutation(s) within the areas targeted by this assay, and inadequate number of viral copies (<250 copies / mL). A negative result must be combined with clinical observations, patient history, and epidemiological information.  Fact Sheet for Patients:   RoadLapTop.co.za  Fact Sheet for Healthcare Providers: http://kim-miller.com/  This test is not yet approved or  cleared by the [place] FDA and has been authorized for detection and/or diagnosis of SARS-CoV-2 by FDA under an Emergency Use Authorization (EUA).  This EUA will remain in effect (meaning this test can be used) for the duration of the COVID-19 declaration under Section 564(b)(1) of the Act, 21 U.S.C. section 360bbb-3(b)(1), unless the authorization is terminated or revoked sooner.  Performed at [facility], [address] [place], [place] [address]   Respiratory (~20 pathogens) panel by PCR     Status: None   Collection Time: 04/18/23  1:27 PM   Specimen: Nasopharyngeal Swab; Respiratory  Result Value Ref Range Status   Adenovirus NOT DETECTED NOT DETECTED Final   Coronavirus 229E NOT DETECTED NOT DETECTED Final    Comment: (NOTE) The Coronavirus on the Respiratory Panel, DOES NOT test for the novel  Coronavirus (2019 nCoV)    Coronavirus HKU1 NOT DETECTED NOT DETECTED Final   Coronavirus NL63 NOT DETECTED NOT DETECTED Final   Coronavirus OC43 NOT DETECTED NOT DETECTED Final   Metapneumovirus NOT DETECTED NOT DETECTED Final   Rhinovirus / Enterovirus NOT DETECTED NOT DETECTED Final   Influenza A NOT DETECTED NOT DETECTED Final   Influenza B NOT DETECTED NOT DETECTED Final   Parainfluenza Virus 1 NOT DETECTED NOT DETECTED Final   Parainfluenza Virus 2 NOT  DETECTED NOT DETECTED Final   Parainfluenza Virus 3 NOT DETECTED NOT DETECTED Final   Parainfluenza Virus 4 NOT DETECTED NOT DETECTED Final   Respiratory Syncytial Virus NOT DETECTED NOT DETECTED Final   Bordetella pertussis NOT DETECTED NOT DETECTED Final   Bordetella Parapertussis NOT DETECTED NOT DETECTED Final   Chlamydophila pneumoniae NOT DETECTED NOT DETECTED Final   Mycoplasma pneumoniae NOT DETECTED NOT DETECTED Final    Comment: Performed at [facility], [address] [place], [place] [address]  Urine Culture (for pregnant, neutropenic or urologic patients or patients with an indwelling urinary catheter)     Status: Abnormal   Collection Time: 04/18/23  1:28 PM   Specimen: Urine, Clean Catch  Result Value Ref Range Status   Specimen Description   Final    URINE, CLEAN CATCH Performed at [facility], [address] [place], [place] [address]    Special Requests   Final    NONE Performed at [facility], [address] [place], [place] [address]    Culture MULTIPLE SPECIES PRESENT, SUGGEST RECOLLECTION (A)  Final   Report Status 04/19/2023 FINAL  Final  Expectorated Sputum Assessment w Gram  Stain, Rflx to Resp Cult     Status: None   Collection Time: 04/19/23  2:00 PM   Specimen: Sputum  Result Value Ref Range Status   Specimen Description SPUTUM  Final   Special Requests NONE  Final   Sputum evaluation   Final    THIS SPECIMEN IS ACCEPTABLE FOR SPUTUM CULTURE Performed at [facility], [address] [place], [place] [address]    Report Status 04/19/2023 FINAL  Final  Culture, Respiratory w Gram Stain     Status: None   Collection Time: 04/19/23  2:00 PM   Specimen: SPU  Result Value Ref Range Status   Specimen Description   Final    SPUTUM Performed at [facility], [address], [place], [place] [address]    Special Requests   Final    NONE Reflexed from [id number] Performed at [facility], [address] [place], [place] [address]    Gram Stain   Final    FEW SQUAMOUS EPITHELIAL CELLS PRESENT FEW WBC PRESENT, PREDOMINANTLY MONONUCLEAR MODERATE GRAM POSITIVE COCCI IN PAIRS AND CHAINS MODERATE GRAM NEGATIVE RODS FEW YEAST    Culture   Final    MODERATE Normal respiratory flora-no Staph aureus or Pseudomonas seen Performed at [facility], [address], [place], [place] [address]    Report Status 04/23/2023 FINAL  Final    Coagulation Studies: No results for input(s): "LABPROT", "INR" in the last 72 hours.    Urinalysis: No results for input(s): "COLORURINE", "LABSPEC", "PHURINE", "GLUCOSEU", "HGBUR", "BILIRUBINUR", "KETONESUR", "PROTEINUR", "UROBILINOGEN", "NITRITE", "LEUKOCYTESUR" in the last 72 hours.  Invalid input(s): "APPERANCEUR"    Imaging: No results found.   Medications:    cefTRIAXone (ROCEPHIN)  IV Stopped (08/05/23 2117)    acetaminophen  1,000 mg Oral Q6H   levothyroxine  150 mcg Oral Daily   metoprolol tartrate  25 mg Oral BID   oxyCODONE  5 mg Oral Q4H   pantoprazole  40 mg Oral Daily   polyethylene glycol  17 g Oral Daily   potassium chloride  10 mEq Oral Daily   senna-docusate  2 tablet Oral QHS   calcium carbonate, ipratropium-albuterol, morphine injection, ondansetron **OR** ondansetron (ZOFRAN) IV  Assessment/ Plan:  [name] is a 82 y.o.  female with past medical conditions including diastolic heart failure, hypertension, hypothyroidism, COPD, and chronic kidney disease stage IV Patient reports to ED with RLE pain. She has been admitted for Hypokalemia [E87.6] Elevated troponin level [R79.89] Demand ischemia (HCC) [I24.89] Chronic anemia [D64.9] Atrial fibrillation with rapid ventricular response (HCC) [I48.91] Atrial fibrillation with RVR (HCC) [I48.91] Current use of long term anticoagulation [Z79.01] Traumatic hematoma of lower leg with infection, right, subsequent encounter [S80.11XD, L08.9] Acute renal failure  superimposed on chronic kidney disease, unspecified acute renal failure type, unspecified CKD stage (HCC) [N17.9, N18.9]   Acute Kidney Injury on chronic kidney disease stage IV with baseline creatinine 2.33 and GFR of 20 on 06/03/23.  Acute kidney injury secondary to cardiorenal  syndrome and concurrent illness. Creatinine elevated to 4.57 on admission.  The creatinine remains elevated beyond stated baseline, it has stabilized.  Patient making adequate urine output.  Diuretics remain held at this time.  No acute indication for dialysis.  Will continue to monitor patient during this admission and at discharge.  Lab Results  Component Value Date   CREATININE 3.26 (H) 08/06/2023   CREATININE 3.19 (H) 08/05/2023   CREATININE 3.52 (H) 08/03/2023  Intake/Output Summary (Last 24 hours) at 08/06/2023 1608 Last data filed at 08/06/2023 0000 Gross per 24 hour  Intake 440 ml  Output --  Net 440 ml    2. Anemia of chronic kidney disease: with anemia of acute blood loss.  Lab Results  Component Value Date   HGB 7.3 (L) 08/05/2023    Hemoglobin remains decreased.  Will continue to monitor.  3. Hyponatremia: Corrected   LOS: 7 Thadd Apuzzo 11/4/20244:08 PM

## 2023-08-06 NOTE — Care Management Important Message (Signed)
Important Message  Patient Details  Name: [name] MRN: [id number] Date of Birth: Apr 15, 1941   Important Message Given:  Yes - Medicare IM     Verita Schneiders Keyonte Cookston 08/06/2023, 3:34 PM

## 2023-08-13 ENCOUNTER — Encounter: Payer: Self-pay | Admitting: Surgery

## 2023-08-13 ENCOUNTER — Ambulatory Visit (INDEPENDENT_AMBULATORY_CARE_PROVIDER_SITE_OTHER): Payer: Medicare Other | Admitting: Surgery

## 2023-08-13 VITALS — BP 156/69 | HR 75 | Temp 98.5°F | Ht 62.0 in | Wt 160.4 lb

## 2023-08-13 DIAGNOSIS — Z09 Encounter for follow-up examination after completed treatment for conditions other than malignant neoplasm: Secondary | ICD-10-CM

## 2023-08-13 DIAGNOSIS — S8011XD Contusion of right lower leg, subsequent encounter: Secondary | ICD-10-CM

## 2023-08-13 NOTE — Progress Notes (Signed)
[name] is 11 days out from evacuation of a right leg hematoma.  She is doing better.  No fevers no chills.  Changing dressing daily.  PE NAD Right leg w wound measuring 8 x 6 cm.  Healing well no evidence of infection.  Applied telfa and wrapped  A/p Doing well  No infection Continue wound care

## 2023-08-13 NOTE — Patient Instructions (Signed)

## 2023-08-14 ENCOUNTER — Inpatient Hospital Stay: Payer: Medicare Other

## 2023-08-14 ENCOUNTER — Inpatient Hospital Stay: Payer: Medicare Other | Attending: Internal Medicine | Admitting: Internal Medicine

## 2023-08-14 ENCOUNTER — Encounter: Payer: Self-pay | Admitting: Internal Medicine

## 2023-08-14 VITALS — BP 142/62 | HR 64 | Temp 98.6°F | Wt 161.0 lb

## 2023-08-14 DIAGNOSIS — I129 Hypertensive chronic kidney disease with stage 1 through stage 4 chronic kidney disease, or unspecified chronic kidney disease: Secondary | ICD-10-CM | POA: Diagnosis present

## 2023-08-14 DIAGNOSIS — N184 Chronic kidney disease, stage 4 (severe): Secondary | ICD-10-CM | POA: Insufficient documentation

## 2023-08-14 DIAGNOSIS — D649 Anemia, unspecified: Secondary | ICD-10-CM

## 2023-08-14 DIAGNOSIS — D631 Anemia in chronic kidney disease: Secondary | ICD-10-CM | POA: Diagnosis not present

## 2023-08-14 LAB — CBC WITH DIFFERENTIAL/PLATELET
Abs Immature Granulocytes: 0.02 10*3/uL (ref 0.00–0.07)
Basophils Absolute: 0 10*3/uL (ref 0.0–0.1)
Basophils Relative: 1 %
Eosinophils Absolute: 0.1 10*3/uL (ref 0.0–0.5)
Eosinophils Relative: 1 %
HCT: 25.3 % — ABNORMAL LOW (ref 36.0–46.0)
Hemoglobin: 8 g/dL — ABNORMAL LOW (ref 12.0–15.0)
Immature Granulocytes: 0 %
Lymphocytes Relative: 40 %
Lymphs Abs: 3.1 10*3/uL (ref 0.7–4.0)
MCH: 27.4 pg (ref 26.0–34.0)
MCHC: 31.6 g/dL (ref 30.0–36.0)
MCV: 86.6 fL (ref 80.0–100.0)
Monocytes Absolute: 0.6 10*3/uL (ref 0.1–1.0)
Monocytes Relative: 8 %
Neutro Abs: 3.9 10*3/uL (ref 1.7–7.7)
Neutrophils Relative %: 50 %
Platelets: 232 10*3/uL (ref 150–400)
RBC: 2.92 MIL/uL — ABNORMAL LOW (ref 3.87–5.11)
RDW: 17.8 % — ABNORMAL HIGH (ref 11.5–15.5)
WBC: 7.8 10*3/uL (ref 4.0–10.5)
nRBC: 0 % (ref 0.0–0.2)

## 2023-08-14 LAB — IRON AND TIBC
Iron: 70 ug/dL (ref 28–170)
Saturation Ratios: 19 % (ref 10.4–31.8)
TIBC: 372 ug/dL (ref 250–450)
UIBC: 302 ug/dL

## 2023-08-14 LAB — FERRITIN: Ferritin: 91 ng/mL (ref 11–307)

## 2023-08-14 LAB — VITAMIN B12: Vitamin B-12: 690 pg/mL (ref 180–914)

## 2023-08-14 NOTE — Progress Notes (Signed)
[facility]  [phone] [phone]  ID: [name] OB: 07/05/1941  MR#: [id number]  VZD#:[id number]  Patient Care Team: Lauro Regulus, MD as PCP - General (Internal Medicine) Michaelyn Barter, MD as Consulting Physician (Oncology)  REFERRING PROVIDER: Dr. Cherylann Ratel  REASON FOR REFERRAL: anemia of CKD  HPI: [name] is a 82 y.o. female with past medical history of anemia, skin cancer, CHF, diabetes, GERD, hypertension, hyperlipidemia, hypothyroidism, CKD stage IV, OSA, osteoporosis referred to hematology for management of normocytic anemia.  Patient was seen today accompanied by her niece.  She follows with Dr. Cherylann Ratel.  Has history of CKD stage IV which has been attributed to her chronic hypertension and prior diabetes.  SPEP/IFE checked in 2022 was negative.  Reports feeling fatigue, shortness of breath on exertion and chest pain for a year now which has been progressively worse. Labs reviewed.  Has been getting progressively anemic.  CBC from 08/05/2023 showed hemoglobin of 7.3.    REVIEW OF SYSTEMS:   ROS  As per HPI. Otherwise, a complete review of systems is negative.  PAST MEDICAL HISTORY: Past Medical History:  Diagnosis Date   Actinic keratosis    Albuminuria    Anemia    Arthritis    Asthma    Basal cell carcinoma 04/12/2017   Above right lateral brow. Nodulocystic type. EDC   Cancer (HCC)    skin   Cataract cortical, senile    CHF (congestive heart failure) (HCC)    Diabetes mellitus without complication (HCC)    GERD (gastroesophageal reflux disease)    Hemorrhoids    History of kidney stones    Hyperlipidemia    Hypertension    Hypothyroidism    Lyme disease    No kidney function    OSA (obstructive sleep apnea)    Osteoporosis    Osteoporosis    Reflux esophagitis    Steatohepatitis    Steatohepatitis     PAST SURGICAL HISTORY: Past Surgical History:  Procedure Laterality Date   ABDOMINAL  HYSTERECTOMY     APPENDECTOMY     AV FISTULA PLACEMENT Left 05/19/2022   Procedure: ARTERIOVENOUS (AV) FISTULA CREATION ( BRACHIAL CEPHALIC );  Surgeon: Renford Dills, MD;  Location: [facility] ORS;  Service: Vascular;  Laterality: Left;   CARDIAC CATHETERIZATION  1980   [facility]   CARDIAC CATHETERIZATION  08/13/2014   [facility]. no significant CAD, normal LVEDP.    CATARACT EXTRACTION     CHOLECYSTECTOMY     COLONOSCOPY     COLONOSCOPY WITH PROPOFOL N/A 12/07/2016   Procedure: COLONOSCOPY WITH PROPOFOL;  Surgeon: Christena Deem, MD;  Location: [facility] ENDOSCOPY;  Service: Endoscopy;  Laterality: N/A;   ESOPHAGOGASTRODUODENOSCOPY     ESOPHAGOGASTRODUODENOSCOPY (EGD) WITH PROPOFOL N/A 12/07/2016   Procedure: ESOPHAGOGASTRODUODENOSCOPY (EGD) WITH PROPOFOL;  Surgeon: Christena Deem, MD;  Location: [facility] ENDOSCOPY;  Service: Endoscopy;  Laterality: N/A;   ESOPHAGOGASTRODUODENOSCOPY (EGD) WITH PROPOFOL N/A 01/07/2018   Procedure: ESOPHAGOGASTRODUODENOSCOPY (EGD) WITH PROPOFOL;  Surgeon: Christena Deem, MD;  Location: [facility] ENDOSCOPY;  Service: Endoscopy;  Laterality: N/A;   EYE SURGERY     HEMATOMA EVACUATION Right 08/02/2023   Procedure: EVACUATION HEMATOMA;  Surgeon: Leafy Ro, MD;  Location: [facility] ORS;  Service: General;  Laterality: Right;   HEMORRHOID SURGERY     PARTIAL HYSTERECTOMY     THYROIDECTOMY N/A 08/25/2019   Procedure: THYROIDECTOMY EXTRACTION OF SUBTOTAL COMPONENT; PARATHYROID AUTOTRANSPLANT X1;  Surgeon: Duanne Guess, MD;  Location: [facility] ORS;  Service: General;  Laterality: N/A;  With Nerve Monitoring(RLN)   TONSILLECTOMY      FAMILY HISTORY: Family History  Problem Relation Age of Onset   Breast cancer Mother    Heart attack Father     HEALTH MAINTENANCE: Social History   Tobacco Use   Smoking status: Never   Smokeless tobacco: Never  Vaping Use   Vaping status: Never Used  Substance Use Topics   Alcohol use: No   Drug use: No     Allergies  Allergen  Reactions   Ace Inhibitors     Other reaction(s): Unknown   Egg-Derived Products Diarrhea   Other     Other reaction(s): Other (See Comments) Eggs   Prednisone     Other reaction(s): Other (See Comments) joint pain   Risedronate     Other reaction(s): Other (See Comments)   Sulfa Antibiotics Itching and Swelling    Other reaction(s): Other (See Comments)   Sulfasalazine Other (See Comments)    Current Outpatient Medications  Medication Sig Dispense Refill   acetaminophen (TYLENOL) 500 MG tablet Take 1,000 mg by mouth every 6 (six) hours as needed for mild pain or moderate pain.     albuterol (PROVENTIL HFA;VENTOLIN HFA) 108 (90 BASE) MCG/ACT inhaler Inhale 2 puffs into the lungs every 6 (six) hours as needed for wheezing or shortness of breath.     amLODipine (NORVASC) 5 MG tablet Take 1 tablet (5 mg total) by mouth daily.     apixaban (ELIQUIS) 2.5 MG TABS tablet Take 1 tablet (2.5 mg total) by mouth 2 (two) times daily. 60 tablet 11   calcium carbonate (TUMS - DOSED IN MG ELEMENTAL CALCIUM) 500 MG chewable tablet Chew 1-2 tablets (200-400 mg of elemental calcium total) by mouth 3 (three) times daily as needed for indigestion or heartburn.     cyanocobalamin (VITAMIN B12) 500 MCG tablet Take 1 tablet (500 mcg total) by mouth daily. 90 tablet 0   esomeprazole (NEXIUM) 40 MG capsule Take 40 mg by mouth daily before breakfast.     fluticasone (FLONASE) 50 MCG/ACT nasal spray Place 2 sprays into both nostrils daily.     fluticasone-salmeterol (ADVAIR) 500-50 MCG/ACT AEPB Inhale 1 puff into the lungs in the morning and at bedtime.     furosemide (LASIX) 40 MG tablet Take 1 tablet (40 mg total) by mouth 2 (two) times daily. 60 tablet 2   hydrALAZINE (APRESOLINE) 100 MG tablet Take 1 tablet (100 mg total) by mouth 3 (three) times daily. 270 tablet 1   ipratropium-albuterol (DUONEB) 0.5-2.5 (3) MG/3ML SOLN Take 3 mLs by nebulization every 6 (six) hours as needed (SOB). 360 mL 0   levothyroxine  (SYNTHROID) 150 MCG tablet Take 150 mcg by mouth daily.     lidocaine (LIDODERM) 5 % Place 1 patch onto the skin every 12 (twelve) hours. Remove & Discard patch within 12 hours or as directed by MD 15 patch 0   loperamide (IMODIUM) 2 MG capsule Take 1 capsule (2 mg total) by mouth as needed for diarrhea or loose stools. 30 capsule 0   metoprolol tartrate (LOPRESSOR) 50 MG tablet Take 25 mg by mouth 2 (two) times daily.     montelukast (SINGULAIR) 10 MG tablet Take 10 mg by mouth at bedtime.     Multiple Minerals-Vitamins (CALCIUM & VIT D3 BONE HEALTH PO) Take 1 tablet by mouth daily. 600 mg/ 25 mg     polyethylene glycol (MIRALAX / GLYCOLAX) 17 g packet Take 17 g  by mouth daily as needed for mild constipation or moderate constipation.     potassium chloride (KLOR-CON) 10 MEQ tablet Take 10 mEq by mouth daily.     rosuvastatin (CRESTOR) 40 MG tablet Take 40 mg by mouth daily.     senna-docusate (SENOKOT-S) 8.6-50 MG tablet Take 2 tablets by mouth at bedtime as needed for mild constipation.     sertraline (ZOLOFT) 100 MG tablet Take 100 mg by mouth daily.     Vitamin D, Ergocalciferol, (DRISDOL) 1.25 MG (50000 UNIT) CAPS capsule Take 1 capsule (50,000 Units total) by mouth every 7 (seven) days. 12 capsule 0   No current facility-administered medications for this visit.    OBJECTIVE: Vitals:   08/14/23 1110  BP: (!) 142/62  Pulse: 64  Temp: 98.6 F (37 C)  SpO2: 99%     Body mass index is 29.45 kg/m.      General: Well-developed, well-nourished, no acute distress. Eyes: Pink conjunctiva, anicteric sclera. HEENT: Normocephalic, moist mucous membranes, clear oropharnyx. Lungs: Clear to auscultation bilaterally. Heart: Regular rate and rhythm. No rubs, murmurs, or gallops. Abdomen: Soft, nontender, nondistended. No organomegaly noted, normoactive bowel sounds. Musculoskeletal: No edema, cyanosis, or clubbing. Neuro: Alert, answering all questions appropriately. Cranial nerves grossly  intact. Skin: No rashes or petechiae noted. Psych: Normal affect. Lymphatics: No cervical, calvicular, axillary or inguinal LAD.   LAB RESULTS:  Lab Results  Component Value Date   NA 136 08/06/2023   K 4.9 08/06/2023   CL 102 08/06/2023   CO2 24 08/06/2023   GLUCOSE 88 08/06/2023   BUN 79 (H) 08/06/2023   CREATININE 3.26 (H) 08/06/2023   CALCIUM 9.4 08/06/2023   PROT 6.3 (L) 08/05/2023   ALBUMIN 2.7 (L) 08/05/2023   AST 28 08/05/2023   ALT 25 08/05/2023   ALKPHOS 75 08/05/2023   BILITOT 0.4 08/05/2023   GFRNONAA 14 (L) 08/06/2023   GFRAA >60 12/31/2016    Lab Results  Component Value Date   WBC 7.8 08/14/2023   NEUTROABS 3.9 08/14/2023   HGB 8.0 (L) 08/14/2023   HCT 25.3 (L) 08/14/2023   MCV 86.6 08/14/2023   PLT 232 08/14/2023    Lab Results  Component Value Date   TIBC 288 06/03/2023   FERRITIN 97 06/15/2022   FERRITIN 106 06/14/2022   FERRITIN 82 06/13/2022   IRONPCTSAT 12 06/03/2023     STUDIES: DG Tibia/Fibula Right  Result Date: 07/30/2023 CLINICAL DATA:  Pain and swelling EXAM: RIGHT TIBIA AND FIBULA - 2 VIEW COMPARISON:  None Available. FINDINGS: No fracture or malalignment. Vascular calcifications. Protuberant soft tissue swelling over the mid to distal anterior lower leg. IMPRESSION: No acute osseous abnormality. Protuberant soft tissue swelling over the mid to distal anterior lower leg. Electronically Signed   By: Jasmine Pang M.D.   On: 07/30/2023 01:20   DG Chest Portable 1 View  Result Date: 07/30/2023 CLINICAL DATA:  Dyspnea EXAM: PORTABLE CHEST 1 VIEW COMPARISON:  06/01/2023 FINDINGS: Cardiomegaly. No acute airspace disease, pleural effusion, or pneumothorax. IMPRESSION: No active disease. Cardiomegaly. Electronically Signed   By: Jasmine Pang M.D.   On: 07/30/2023 00:29   DG Ankle Complete Right  Result Date: 07/22/2023 CLINICAL DATA:  Recent fall with twisting injury, initial encounter EXAM: RIGHT ANKLE - COMPLETE 3+ VIEW COMPARISON:   None Available. FINDINGS: No acute fracture or dislocation is noted. Calcaneal spurring and tarsal degenerative changes are seen. Considerable soft tissue swelling is noted laterally and anteriorly in the distal lower leg and ankle  consistent with the recent injury. IMPRESSION: Considerable soft tissue swelling as described without acute bony abnormality. Electronically Signed   By: Alcide Clever M.D.   On: 07/22/2023 23:03   DG Tibia/Fibula Right  Result Date: 07/22/2023 CLINICAL DATA:  Injury due to a fall.  Unable to bear weight. EXAM: RIGHT TIBIA AND FIBULA - 2 VIEW COMPARISON:  Right ankle 07/22/2023. FINDINGS: Mild degenerative changes suggested in the right knee and right ankle. No evidence of acute fracture or dislocation. No focal bone lesion or bone destruction. Vascular calcifications in the soft tissues. IMPRESSION: No acute bony abnormalities. Degenerative changes in the knee and ankle. Electronically Signed   By: Burman Nieves M.D.   On: 07/22/2023 23:03    ASSESSMENT AND PLAN:   [name] is a 82 y.o. female with pmh of anemia, skin cancer, CHF, diabetes, GERD, hypertension, hyperlipidemia, hypothyroidism, CKD stage IV, OSA, osteoporosis referred to hematology for management of normocytic anemia.  # Normocytic anemia # CKD stage IV -Likely secondary to CKD.  Cannot rule out other etiologies such as nutritional deficiencies. Has history of CKD stage IV which has been attributed to her chronic hypertension and prior diabetes.  SPEP/IFE checked in 2022 was negative.  -Discussed with the patient about various etiologies for anemia such as nutritional deficiencies, paraproteinemia, chronic kidney disease.  Will obtain iron levels and B12.  If she is iron deficient, we will consider treating that first either with IV iron or oral both options were discussed.  If iron levels are normal, we will proceed with Aranesp injections 200 mcg every 4 weeks.  If no response will escalate to 300.   Side effects such as hypertension, increased risk of thromboembolic event and cardiovascular event if hemoglobin exceeded 11 was discussed.  Patient would like me to discuss the labs with her niece next week.  Will schedule a phone visit.  Orders Placed This Encounter  Procedures   CBC with Differential/Platelet   Iron and TIBC   Ferritin   Vitamin B12   Erythropoietin   Multiple Myeloma Panel (SPEP&IFE w/QIG)   Kappa/lambda light chains   RTC in 1 week via phone visit.  Patient expressed understanding and was in agreement with this plan. She also understands that She can call clinic at any time with any questions, concerns, or complaints.   I spent a total of 45 minutes reviewing chart data, face-to-face evaluation with the patient, counseling and coordination of care as detailed above.  Michaelyn Barter, MD   08/14/2023 12:25 PM

## 2023-08-14 NOTE — Progress Notes (Signed)
Patient has dizziness sometimes

## 2023-08-15 LAB — KAPPA/LAMBDA LIGHT CHAINS
Kappa free light chain: 68 mg/L — ABNORMAL HIGH (ref 3.3–19.4)
Kappa, lambda light chain ratio: 1.25 (ref 0.26–1.65)
Lambda free light chains: 54.5 mg/L — ABNORMAL HIGH (ref 5.7–26.3)

## 2023-08-15 LAB — ERYTHROPOIETIN: Erythropoietin: 23.9 m[IU]/mL — ABNORMAL HIGH (ref 2.6–18.5)

## 2023-08-19 LAB — MULTIPLE MYELOMA PANEL, SERUM
Albumin SerPl Elph-Mcnc: 3.2 g/dL (ref 2.9–4.4)
Albumin/Glob SerPl: 1 (ref 0.7–1.7)
Alpha 1: 0.2 g/dL (ref 0.0–0.4)
Alpha2 Glob SerPl Elph-Mcnc: 1 g/dL (ref 0.4–1.0)
B-Globulin SerPl Elph-Mcnc: 1.1 g/dL (ref 0.7–1.3)
Gamma Glob SerPl Elph-Mcnc: 1 g/dL (ref 0.4–1.8)
Globulin, Total: 3.4 g/dL (ref 2.2–3.9)
IgA: 231 mg/dL (ref 64–422)
IgG (Immunoglobin G), Serum: 1053 mg/dL (ref 586–1602)
IgM (Immunoglobulin M), Srm: 55 mg/dL (ref 26–217)
Total Protein ELP: 6.6 g/dL (ref 6.0–8.5)

## 2023-08-21 ENCOUNTER — Inpatient Hospital Stay: Payer: Medicare Other | Admitting: Internal Medicine

## 2023-08-21 DIAGNOSIS — D631 Anemia in chronic kidney disease: Secondary | ICD-10-CM | POA: Diagnosis not present

## 2023-08-21 DIAGNOSIS — N184 Chronic kidney disease, stage 4 (severe): Secondary | ICD-10-CM

## 2023-08-21 NOTE — Progress Notes (Signed)
[facility]  [phone] [phone]  I connected with Roshena Beilstein Raucci on 08/21/23 at  3:30 PM EST by phone and verified that I am speaking with the correct person using two identifiers.   I discussed the limitations, risks, security and privacy concerns of performing an evaluation and management service by telemedicine and the availability of in-person appointments. I also discussed with the patient that there may be a patient responsible charge related to this service. The patient expressed understanding and agreed to proceed.   Other persons participating in the visit and their role in the encounter:    Patient's location:  spoke with niece Duwayne Heck, patient provided permission to discuss care.  Provider's location: clinic   Chief Complaint: discuss labs  ID: QWANISHA CIOLINO OB: March 26, 1941  MR#: [id number]  AOZ#:[id number]  Patient Care Team: Lauro Regulus, MD as PCP - General (Internal Medicine) Michaelyn Barter, MD as Consulting Physician (Oncology)  REFERRING PROVIDER: Dr. Cherylann Ratel  REASON FOR REFERRAL: anemia of CKD  HPI: BELLINA BUREAU is a 82 y.o. female with past medical history of anemia, skin cancer, CHF, diabetes, GERD, hypertension, hyperlipidemia, hypothyroidism, CKD stage IV, OSA, osteoporosis referred to hematology for management of normocytic anemia.  Patient was seen today accompanied by her niece.  She follows with Dr. Cherylann Ratel.  Has history of CKD stage IV which has been attributed to her chronic hypertension and prior diabetes.  SPEP/IFE checked in 2022 was negative.  Reports feeling fatigue, shortness of breath on exertion and chest pain for a year now which has been progressively worse. Labs reviewed.  Has been getting progressively anemic.  CBC from 08/05/2023 showed hemoglobin of 7.3.  Interval history Connected with niece over the phone. Pt not available. However, patient provided permission to discuss all the care with  [facility].  Duwayne Heck denies any new changes with patient's health.   REVIEW OF SYSTEMS:   Review of Systems  Constitutional:  Positive for malaise/fatigue.    As per HPI. Otherwise, a complete review of systems is negative.  PAST MEDICAL HISTORY: Past Medical History:  Diagnosis Date   Actinic keratosis    Albuminuria    Anemia    Arthritis    Asthma    Basal cell carcinoma 04/12/2017   Above right lateral brow. Nodulocystic type. EDC   Cancer (HCC)    skin   Cataract cortical, senile    CHF (congestive heart failure) (HCC)    Diabetes mellitus without complication (HCC)    GERD (gastroesophageal reflux disease)    Hemorrhoids    History of kidney stones    Hyperlipidemia    Hypertension    Hypothyroidism    Lyme disease    No kidney function    OSA (obstructive sleep apnea)    Osteoporosis    Osteoporosis    Reflux esophagitis    Steatohepatitis    Steatohepatitis     PAST SURGICAL HISTORY: Past Surgical History:  Procedure Laterality Date   ABDOMINAL HYSTERECTOMY     APPENDECTOMY     AV FISTULA PLACEMENT Left 05/19/2022   Procedure: ARTERIOVENOUS (AV) FISTULA CREATION ( BRACHIAL CEPHALIC );  Surgeon: Renford Dills, MD;  Location: [facility] ORS;  Service: Vascular;  Laterality: Left;   CARDIAC CATHETERIZATION  1980   [facility]   CARDIAC CATHETERIZATION  08/13/2014   [facility]. no significant CAD, normal LVEDP.    CATARACT EXTRACTION     CHOLECYSTECTOMY     COLONOSCOPY     COLONOSCOPY WITH PROPOFOL  N/A 12/07/2016   Procedure: COLONOSCOPY WITH PROPOFOL;  Surgeon: Christena Deem, MD;  Location: [facility] ENDOSCOPY;  Service: Endoscopy;  Laterality: N/A;   ESOPHAGOGASTRODUODENOSCOPY     ESOPHAGOGASTRODUODENOSCOPY (EGD) WITH PROPOFOL N/A 12/07/2016   Procedure: ESOPHAGOGASTRODUODENOSCOPY (EGD) WITH PROPOFOL;  Surgeon: Christena Deem, MD;  Location: [facility] ENDOSCOPY;  Service: Endoscopy;  Laterality: N/A;   ESOPHAGOGASTRODUODENOSCOPY (EGD) WITH PROPOFOL N/A 01/07/2018    Procedure: ESOPHAGOGASTRODUODENOSCOPY (EGD) WITH PROPOFOL;  Surgeon: Christena Deem, MD;  Location: [facility] ENDOSCOPY;  Service: Endoscopy;  Laterality: N/A;   EYE SURGERY     HEMATOMA EVACUATION Right 08/02/2023   Procedure: EVACUATION HEMATOMA;  Surgeon: Leafy Ro, MD;  Location: [facility] ORS;  Service: General;  Laterality: Right;   HEMORRHOID SURGERY     PARTIAL HYSTERECTOMY     THYROIDECTOMY N/A 08/25/2019   Procedure: THYROIDECTOMY EXTRACTION OF SUBTOTAL COMPONENT; PARATHYROID AUTOTRANSPLANT X1;  Surgeon: Duanne Guess, MD;  Location: [facility] ORS;  Service: General;  Laterality: N/A;  With Nerve Monitoring(RLN)   TONSILLECTOMY      FAMILY HISTORY: Family History  Problem Relation Age of Onset   Breast cancer Mother    Heart attack Father     HEALTH MAINTENANCE: Social History   Tobacco Use   Smoking status: Never   Smokeless tobacco: Never  Vaping Use   Vaping status: Never Used  Substance Use Topics   Alcohol use: No   Drug use: No     Allergies  Allergen Reactions   Ace Inhibitors     Other reaction(s): Unknown   Egg-Derived Products Diarrhea   Other     Other reaction(s): Other (See Comments) Eggs   Prednisone     Other reaction(s): Other (See Comments) joint pain   Risedronate     Other reaction(s): Other (See Comments)   Sulfa Antibiotics Itching and Swelling    Other reaction(s): Other (See Comments)   Sulfasalazine Other (See Comments)    Current Outpatient Medications  Medication Sig Dispense Refill   acetaminophen (TYLENOL) 500 MG tablet Take 1,000 mg by mouth every 6 (six) hours as needed for mild pain or moderate pain.     albuterol (PROVENTIL HFA;VENTOLIN HFA) 108 (90 BASE) MCG/ACT inhaler Inhale 2 puffs into the lungs every 6 (six) hours as needed for wheezing or shortness of breath.     amLODipine (NORVASC) 5 MG tablet Take 1 tablet (5 mg total) by mouth daily.     apixaban (ELIQUIS) 2.5 MG TABS tablet Take 1 tablet (2.5 mg total) by mouth 2  (two) times daily. 60 tablet 11   calcium carbonate (TUMS - DOSED IN MG ELEMENTAL CALCIUM) 500 MG chewable tablet Chew 1-2 tablets (200-400 mg of elemental calcium total) by mouth 3 (three) times daily as needed for indigestion or heartburn.     cyanocobalamin (VITAMIN B12) 500 MCG tablet Take 1 tablet (500 mcg total) by mouth daily. 90 tablet 0   esomeprazole (NEXIUM) 40 MG capsule Take 40 mg by mouth daily before breakfast.     fluticasone (FLONASE) 50 MCG/ACT nasal spray Place 2 sprays into both nostrils daily.     fluticasone-salmeterol (ADVAIR) 500-50 MCG/ACT AEPB Inhale 1 puff into the lungs in the morning and at bedtime.     furosemide (LASIX) 40 MG tablet Take 1 tablet (40 mg total) by mouth 2 (two) times daily. 60 tablet 2   hydrALAZINE (APRESOLINE) 100 MG tablet Take 1 tablet (100 mg total) by mouth 3 (three) times daily. 270 tablet 1   ipratropium-albuterol (  DUONEB) 0.5-2.5 (3) MG/3ML SOLN Take 3 mLs by nebulization every 6 (six) hours as needed (SOB). 360 mL 0   levothyroxine (SYNTHROID) 150 MCG tablet Take 150 mcg by mouth daily.     lidocaine (LIDODERM) 5 % Place 1 patch onto the skin every 12 (twelve) hours. Remove & Discard patch within 12 hours or as directed by MD 15 patch 0   loperamide (IMODIUM) 2 MG capsule Take 1 capsule (2 mg total) by mouth as needed for diarrhea or loose stools. 30 capsule 0   metoprolol tartrate (LOPRESSOR) 50 MG tablet Take 25 mg by mouth 2 (two) times daily.     montelukast (SINGULAIR) 10 MG tablet Take 10 mg by mouth at bedtime.     Multiple Minerals-Vitamins (CALCIUM & VIT D3 BONE HEALTH PO) Take 1 tablet by mouth daily. 600 mg/ 25 mg     polyethylene glycol (MIRALAX / GLYCOLAX) 17 g packet Take 17 g by mouth daily as needed for mild constipation or moderate constipation.     potassium chloride (KLOR-CON) 10 MEQ tablet Take 10 mEq by mouth daily.     rosuvastatin (CRESTOR) 40 MG tablet Take 40 mg by mouth daily.     senna-docusate (SENOKOT-S) 8.6-50 MG  tablet Take 2 tablets by mouth at bedtime as needed for mild constipation.     sertraline (ZOLOFT) 100 MG tablet Take 100 mg by mouth daily.     Vitamin D, Ergocalciferol, (DRISDOL) 1.25 MG (50000 UNIT) CAPS capsule Take 1 capsule (50,000 Units total) by mouth every 7 (seven) days. 12 capsule 0   No current facility-administered medications for this visit.    OBJECTIVE: There were no vitals filed for this visit.    There is no height or weight on file to calculate BMI.      Physical exam not performed. Virtual visit.    LAB RESULTS:  Lab Results  Component Value Date   NA 136 08/06/2023   K 4.9 08/06/2023   CL 102 08/06/2023   CO2 24 08/06/2023   GLUCOSE 88 08/06/2023   BUN 79 (H) 08/06/2023   CREATININE 3.26 (H) 08/06/2023   CALCIUM 9.4 08/06/2023   PROT 6.3 (L) 08/05/2023   ALBUMIN 2.7 (L) 08/05/2023   AST 28 08/05/2023   ALT 25 08/05/2023   ALKPHOS 75 08/05/2023   BILITOT 0.4 08/05/2023   GFRNONAA 14 (L) 08/06/2023   GFRAA >60 12/31/2016    Lab Results  Component Value Date   WBC 7.8 08/14/2023   NEUTROABS 3.9 08/14/2023   HGB 8.0 (L) 08/14/2023   HCT 25.3 (L) 08/14/2023   MCV 86.6 08/14/2023   PLT 232 08/14/2023    Lab Results  Component Value Date   TIBC 372 08/14/2023   TIBC 288 06/03/2023   FERRITIN 91 08/14/2023   FERRITIN 97 06/15/2022   FERRITIN 106 06/14/2022   IRONPCTSAT 19 08/14/2023   IRONPCTSAT 12 06/03/2023     STUDIES: DG Tibia/Fibula Right  Result Date: 07/30/2023 CLINICAL DATA:  Pain and swelling EXAM: RIGHT TIBIA AND FIBULA - 2 VIEW COMPARISON:  None Available. FINDINGS: No fracture or malalignment. Vascular calcifications. Protuberant soft tissue swelling over the mid to distal anterior lower leg. IMPRESSION: No acute osseous abnormality. Protuberant soft tissue swelling over the mid to distal anterior lower leg. Electronically Signed   By: Jasmine Pang M.D.   On: 07/30/2023 01:20   DG Chest Portable 1 View  Result Date:  07/30/2023 CLINICAL DATA:  Dyspnea EXAM: PORTABLE CHEST 1 VIEW COMPARISON:  06/01/2023 FINDINGS: Cardiomegaly. No acute airspace disease, pleural effusion, or pneumothorax. IMPRESSION: No active disease. Cardiomegaly. Electronically Signed   By: Jasmine Pang M.D.   On: 07/30/2023 00:29   DG Ankle Complete Right  Result Date: 07/22/2023 CLINICAL DATA:  Recent fall with twisting injury, initial encounter EXAM: RIGHT ANKLE - COMPLETE 3+ VIEW COMPARISON:  None Available. FINDINGS: No acute fracture or dislocation is noted. Calcaneal spurring and tarsal degenerative changes are seen. Considerable soft tissue swelling is noted laterally and anteriorly in the distal lower leg and ankle consistent with the recent injury. IMPRESSION: Considerable soft tissue swelling as described without acute bony abnormality. Electronically Signed   By: Alcide Clever M.D.   On: 07/22/2023 23:03   DG Tibia/Fibula Right  Result Date: 07/22/2023 CLINICAL DATA:  Injury due to a fall.  Unable to bear weight. EXAM: RIGHT TIBIA AND FIBULA - 2 VIEW COMPARISON:  Right ankle 07/22/2023. FINDINGS: Mild degenerative changes suggested in the right knee and right ankle. No evidence of acute fracture or dislocation. No focal bone lesion or bone destruction. Vascular calcifications in the soft tissues. IMPRESSION: No acute bony abnormalities. Degenerative changes in the knee and ankle. Electronically Signed   By: Burman Nieves M.D.   On: 07/22/2023 23:03    ASSESSMENT AND PLAN:   JANEIA SPANBAUER is a 82 y.o. female with pmh of anemia, skin cancer, CHF, diabetes, GERD, hypertension, hyperlipidemia, hypothyroidism, CKD stage IV, OSA, osteoporosis referred to hematology for management of normocytic anemia.  # Normocytic anemia # CKD stage IV -Likely secondary to CKD.  Has history of CKD stage IV which has been attributed to her chronic hypertension and prior diabetes.  SPEP/IFE checked in 2022 was negative.  -Iron panel/b12/folate  normal. SPEP/IFE no M protein. Kappa/lambda proportionately elevated likely due to CKD. EPO 23 low for the degree of anemia. Hb 7.3. Discussed about starting on Aranesp 200 mcg ever y4 weeks. Side effects such as HTN, increased risk of thromboembolic events or CV events for Hb >11 discussed. Agreeable to proceed. Will schedule for aranesp/lab in 1 week. F/u in 5 weeks for labs/aranesp.   Orders Placed This Encounter  Procedures   Hemoglobin and Hematocrit (Cancer Center Only)   RTC in 1 week lab/aranesp 5 weeks md visit, lab, aranesp  Spent 20 mins discussing the information.   Patient expressed understanding and was in agreement with this plan. She also understands that She can call clinic at any time with any questions, concerns, or complaints.   I spent a total of 30 minutes reviewing chart data, face-to-face evaluation with the patient, counseling and coordination of care as detailed above.  Michaelyn Barter, MD   08/21/2023 5:48 PM

## 2023-08-22 ENCOUNTER — Telehealth: Payer: Self-pay

## 2023-08-22 NOTE — Progress Notes (Unsigned)
Name: [name] MRN: [id number] DOB: 06-08-1941    CHIEF COMPLAINT:  EXCESSIVE DAYTIME SLEEPINESS   HISTORY OF PRESENT ILLNESS: Patient is seen today for problems and issues with sleep related to excessive daytime sleepiness Patient  has been having sleep problems for many years Patient has been having excessive daytime sleepiness for a long time Patient has been having extreme fatigue and tiredness, lack of energy +  very Loud snoring every night + struggling breathe at night and gasps for air + morning headaches + Nonrefreshing sleep  Discussed sleep data and reviewed with patient.  Encouraged proper weight management.  Discussed driving precautions and its relationship with hypersomnolence.  Discussed operating dangerous equipment and its relationship with hypersomnolence.  Discussed sleep hygiene, and benefits of a fixed sleep waked time.  The importance of getting eight or more hours of sleep discussed with patient.  Discussed limiting the use of the computer and television before bedtime.  Decrease naps during the day, so night time sleep will become enhanced.  Limit caffeine, and sleep deprivation.  HTN, stroke, and heart failure are potential risk factors.    EPWORTH SLEEP SCORE***    PAST MEDICAL HISTORY :   has a past medical history of Actinic keratosis, Albuminuria, Anemia, Arthritis, Asthma, Basal cell carcinoma (04/12/2017), Cancer (HCC), Cataract cortical, senile, CHF (congestive heart failure) (HCC), Diabetes mellitus without complication (HCC), GERD (gastroesophageal reflux disease), Hemorrhoids, History of kidney stones, Hyperlipidemia, Hypertension, Hypothyroidism, Lyme disease, No kidney function, OSA (obstructive sleep apnea), Osteoporosis, Osteoporosis, Reflux esophagitis, Steatohepatitis, and Steatohepatitis.  has a past surgical history that includes Appendectomy; Cataract extraction; Tonsillectomy; Cholecystectomy; Partial hysterectomy; Cardiac  catheterization (1980); Cardiac catheterization (08/13/2014); Colonoscopy; Esophagogastroduodenoscopy; Hemorrhoid surgery; Colonoscopy with propofol (N/A, 12/07/2016); Esophagogastroduodenoscopy (egd) with propofol (N/A, 12/07/2016); Abdominal hysterectomy; Eye surgery; Esophagogastroduodenoscopy (egd) with propofol (N/A, 01/07/2018); Thyroidectomy (N/A, 08/25/2019); AV fistula placement (Left, 05/19/2022); and Hematoma evacuation (Right, 08/02/2023). Prior to Admission medications   Medication Sig Start Date End Date Taking? Authorizing Provider  acetaminophen (TYLENOL) 500 MG tablet Take 1,000 mg by mouth every 6 (six) hours as needed for mild pain or moderate pain.    [provider]  albuterol (PROVENTIL HFA;VENTOLIN HFA) 108 (90 BASE) MCG/ACT inhaler Inhale 2 puffs into the lungs every 6 (six) hours as needed for wheezing or shortness of breath.    [provider]  amLODipine (NORVASC) 5 MG tablet Take 1 tablet (5 mg total) by mouth daily. 08/06/23 08/05/24  Sunnie Nielsen, DO  apixaban (ELIQUIS) 2.5 MG TABS tablet Take 1 tablet (2.5 mg total) by mouth 2 (two) times daily. 06/04/23 06/03/24  Gillis Santa, MD  calcium carbonate (TUMS - DOSED IN MG ELEMENTAL CALCIUM) 500 MG chewable tablet Chew 1-2 tablets (200-400 mg of elemental calcium total) by mouth 3 (three) times daily as needed for indigestion or heartburn. 08/06/23   Sunnie Nielsen, DO  cyanocobalamin (VITAMIN B12) 500 MCG tablet Take 1 tablet (500 mcg total) by mouth daily. 06/05/23 09/03/23  Gillis Santa, MD  esomeprazole (NEXIUM) 40 MG capsule Take 40 mg by mouth daily before breakfast.    [provider]  fluticasone (FLONASE) 50 MCG/ACT nasal spray Place 2 sprays into both nostrils daily.    [provider]  fluticasone-salmeterol (ADVAIR) 500-50 MCG/ACT AEPB Inhale 1 puff into the lungs in the morning and at bedtime.    [provider]  furosemide (LASIX) 40 MG tablet Take 1 tablet (40 mg total) by  mouth 2 (two) times daily. 06/04/23 09/02/23  Lucianne Muss,  Dileep, MD  hydrALAZINE (APRESOLINE) 100 MG tablet Take 1 tablet (100 mg total) by mouth 3 (three) times daily. 04/22/23   Wouk, Wilfred Curtis, MD  ipratropium-albuterol (DUONEB) 0.5-2.5 (3) MG/3ML SOLN Take 3 mLs by nebulization every 6 (six) hours as needed (SOB). 03/27/23   Arnetha Courser, MD  levothyroxine (SYNTHROID) 150 MCG tablet Take 150 mcg by mouth daily.    [provider]  lidocaine (LIDODERM) 5 % Place 1 patch onto the skin every 12 (twelve) hours. Remove & Discard patch within 12 hours or as directed by MD 06/08/22   Sharman Cheek, MD  loperamide (IMODIUM) 2 MG capsule Take 1 capsule (2 mg total) by mouth as needed for diarrhea or loose stools. 08/05/22   Arnetha Courser, MD  metoprolol tartrate (LOPRESSOR) 50 MG tablet Take 25 mg by mouth 2 (two) times daily.    [provider]  montelukast (SINGULAIR) 10 MG tablet Take 10 mg by mouth at bedtime.    [provider]  Multiple Minerals-Vitamins (CALCIUM & VIT D3 BONE HEALTH PO) Take 1 tablet by mouth daily. 600 mg/ 25 mg    [provider]  polyethylene glycol (MIRALAX / GLYCOLAX) 17 g packet Take 17 g by mouth daily as needed for mild constipation or moderate constipation. 08/06/23   Sunnie Nielsen, DO  potassium chloride (KLOR-CON) 10 MEQ tablet Take 10 mEq by mouth daily. 05/30/23 05/29/24  [provider]  rosuvastatin (CRESTOR) 40 MG tablet Take 40 mg by mouth daily.    [provider]  senna-docusate (SENOKOT-S) 8.6-50 MG tablet Take 2 tablets by mouth at bedtime as needed for mild constipation. 08/06/23   Sunnie Nielsen, DO  sertraline (ZOLOFT) 100 MG tablet Take 100 mg by mouth daily.    [provider]  Vitamin D, Ergocalciferol, (DRISDOL) 1.25 MG (50000 UNIT) CAPS capsule Take 1 capsule (50,000 Units total) by mouth every 7 (seven) days. 06/10/23 09/08/23  Gillis Santa, MD   Allergies  Allergen Reactions   Ace  Inhibitors     Other reaction(s): Unknown   Egg-Derived Products Diarrhea   Other     Other reaction(s): Other (See Comments) Eggs   Prednisone     Other reaction(s): Other (See Comments) joint pain   Risedronate     Other reaction(s): Other (See Comments)   Sulfa Antibiotics Itching and Swelling    Other reaction(s): Other (See Comments)   Sulfasalazine Other (See Comments)    FAMILY HISTORY:  family history includes Breast cancer in her mother; Heart attack in her father. SOCIAL HISTORY:  reports that she has never smoked. She has never used smokeless tobacco. She reports that she does not drink alcohol and does not use drugs.   Review of Systems:  Gen:  Denies  fever, sweats, chills weight loss  HEENT: Denies blurred vision, double vision, ear pain, eye pain, hearing loss, nose bleeds, sore throat Cardiac:  No dizziness, chest pain or heaviness, chest tightness,edema, No JVD Resp:   No cough, -sputum production, -shortness of breath,-wheezing, -hemoptysis,  Gi: Denies swallowing difficulty, stomach pain, nausea or vomiting, diarrhea, constipation, bowel incontinence Gu:  Denies bladder incontinence, burning urine Ext:   Denies Joint pain, stiffness or swelling Skin: Denies  skin rash, easy bruising or bleeding or hives Endoc:  Denies polyuria, polydipsia , polyphagia or weight change Psych:   Denies depression, insomnia or hallucinations  Other:  All other systems negative   ALL OTHER ROS ARE NEGATIVE    VITAL SIGNS: @VSRANGES @  Physical Examination:   General Appearance: No distress  EYES PERRLA, EOM intact.   NECK Supple, No JVD Pulmonary: normal breath sounds, No wheezing.  CardiovascularNormal S1,S2.  No m/r/g.   Abdomen: Benign, Soft, non-tender. Skin:   warm, no rashes, no ecchymosis  Extremities: normal, no cyanosis, clubbing. Neuro:without focal findings,  speech normal  PSYCHIATRIC: Mood, affect within normal limits.   ALL OTHER ROS ARE  NEGATIVE    ASSESSMENT AND PLAN SYNOPSIS  Patient with signs and symptoms of excessive daytime sleepiness with probable underlying diagnosis of obstructive sleep apnea in the setting of obesity and deconditioned state   Recommend Sleep Study for definitve diagnosis  Obesity -recommend significant weight loss -recommend changing diet  Deconditioned state -Recommend increased daily activity and exercise   MEDICATION ADJUSTMENTS/LABS AND TESTS ORDERED: Recommend Sleep Study Recommend weight loss   CURRENT MEDICATIONS REVIEWED AT LENGTH WITH PATIENT TODAY   Patient  satisfied with Plan of action and management. All questions answered  Follow up  3 months  Total Time Spent  45 mins   Wallis Bamberg Santiago Glad, M.D.  [facility]  Medical Director [facility] Medical Director [facility] Cardio-Pulmonary Department

## 2023-08-22 NOTE — Telephone Encounter (Addendum)
Spoke to patient. She stated that she had sleep study 20 years ago. She has machine since.  Nothing further needed.

## 2023-08-23 ENCOUNTER — Telehealth: Payer: Self-pay | Admitting: Internal Medicine

## 2023-08-23 ENCOUNTER — Encounter: Payer: Self-pay | Admitting: Internal Medicine

## 2023-08-23 ENCOUNTER — Ambulatory Visit: Payer: Medicare Other | Admitting: Internal Medicine

## 2023-08-23 ENCOUNTER — Encounter: Payer: Medicare Other | Attending: Physician Assistant | Admitting: Physician Assistant

## 2023-08-23 VITALS — BP 126/60 | HR 67 | Temp 97.6°F | Ht 62.0 in | Wt 160.6 lb

## 2023-08-23 DIAGNOSIS — L97812 Non-pressure chronic ulcer of other part of right lower leg with fat layer exposed: Secondary | ICD-10-CM | POA: Insufficient documentation

## 2023-08-23 DIAGNOSIS — S81811A Laceration without foreign body, right lower leg, initial encounter: Secondary | ICD-10-CM | POA: Diagnosis not present

## 2023-08-23 DIAGNOSIS — I509 Heart failure, unspecified: Secondary | ICD-10-CM | POA: Insufficient documentation

## 2023-08-23 DIAGNOSIS — J449 Chronic obstructive pulmonary disease, unspecified: Secondary | ICD-10-CM

## 2023-08-23 DIAGNOSIS — J383 Other diseases of vocal cords: Secondary | ICD-10-CM | POA: Diagnosis not present

## 2023-08-23 DIAGNOSIS — E11622 Type 2 diabetes mellitus with other skin ulcer: Secondary | ICD-10-CM | POA: Diagnosis present

## 2023-08-23 DIAGNOSIS — W228XXA Striking against or struck by other objects, initial encounter: Secondary | ICD-10-CM | POA: Diagnosis not present

## 2023-08-23 DIAGNOSIS — G4733 Obstructive sleep apnea (adult) (pediatric): Secondary | ICD-10-CM

## 2023-08-23 DIAGNOSIS — J441 Chronic obstructive pulmonary disease with (acute) exacerbation: Secondary | ICD-10-CM

## 2023-08-23 DIAGNOSIS — Z7901 Long term (current) use of anticoagulants: Secondary | ICD-10-CM | POA: Diagnosis not present

## 2023-08-23 DIAGNOSIS — J4489 Other specified chronic obstructive pulmonary disease: Secondary | ICD-10-CM | POA: Diagnosis not present

## 2023-08-23 DIAGNOSIS — I480 Paroxysmal atrial fibrillation: Secondary | ICD-10-CM | POA: Diagnosis not present

## 2023-08-23 DIAGNOSIS — N184 Chronic kidney disease, stage 4 (severe): Secondary | ICD-10-CM | POA: Diagnosis not present

## 2023-08-23 DIAGNOSIS — I132 Hypertensive heart and chronic kidney disease with heart failure and with stage 5 chronic kidney disease, or end stage renal disease: Secondary | ICD-10-CM | POA: Insufficient documentation

## 2023-08-23 DIAGNOSIS — E1122 Type 2 diabetes mellitus with diabetic chronic kidney disease: Secondary | ICD-10-CM | POA: Diagnosis not present

## 2023-08-23 MED ORDER — PREDNISONE 10 MG PO TABS: 10.0000 mg | ORAL_TABLET | Freq: Every day | ORAL | 0 refills | Status: DC

## 2023-08-23 MED ORDER — AZITHROMYCIN 250 MG PO TABS: ORAL_TABLET | ORAL | 0 refills | Status: DC

## 2023-08-23 MED ORDER — PREDNISONE 10 MG PO TABS
40.0000 mg | ORAL_TABLET | Freq: Every day | ORAL | 1 refills | Status: DC
Start: 2023-08-23 — End: 2023-08-23

## 2023-08-23 MED ORDER — PREDNISONE 10 MG PO TABS: ORAL_TABLET | ORAL | 0 refills | Status: DC

## 2023-08-23 NOTE — Telephone Encounter (Signed)
[facility] needs Prednisone clarification. Doctor prescribed 5 pills but the directions say take 4 daily with breakfast. They also have a note in their system that states patient is allergic to prednisone.

## 2023-08-23 NOTE — Telephone Encounter (Signed)
Per Dr. Belia Heman verbally- prednisone 10mg  daily x5d. Okay to take low dose prednisone.  Rx has been corrected.  Amy with [facility] is aware and voiced her understanding.  Nothing further needed.

## 2023-08-23 NOTE — Telephone Encounter (Signed)
Spoke to Amy with [facility]. She is wanting to verify prednisone instructions. Rx stated 4 tablets (40mg ) daily for 10 day but only 5 tablets were sent in.  She also stated that patient has a reaction to prednisone.   Dr. Belia Heman, please advise. Thanks

## 2023-08-23 NOTE — Patient Instructions (Signed)
We will order Sleep Study in the in the lab to make sure everything is working properly  We will Refer to Ear Nose and Throat Doctor to look at Vocal cords  We will order Lung function tests  Start Prednisone 10 mg daily for 5 days Start Z pak Antibiotics  Continue Inhalers as prescribed  Avoid secondhand smoke Avoid SICK contacts Recommend  Masking  when appropriate Recommend Keep up-to-date with vaccinations

## 2023-08-23 NOTE — Progress Notes (Signed)
SHAMIYA, RATZLOFF ([id number]) [phone] [email or web address] Page 1 of 5 Visit Report for 08/23/2023 Abuse Risk Screen Details Patient Name: Date of Service: [name], [name] 08/23/2023 8:15 A M Medical Record Number: [id number] Patient Account Number: [id number] Date of Birth/Sex: Treating RN: [birth year] (82 y.o. [name] Primary Care Ona Roehrs: Einar Crow Other Clinician: Referring Gevon Markus: Treating Shakema Surita/Extender: Johny Shears in Treatment: 0 Abuse Risk Screen Items Answer ABUSE RISK SCREEN: Has anyone close to you tried to hurt or harm you recentlyo No Do you feel uncomfortable with anyone in your familyo No Has anyone forced you do things that you didnt want to doo No Electronic Signature(s) Signed: 08/23/2023 4:35:52 PM By: Angelina Pih Entered By: Angelina Pih on 08/23/2023 08:40:50 -------------------------------------------------------------------------------- Activities of Daily Living Details Patient Name: Date of Service: [name], [name] 08/23/2023 8:15 A M Medical Record Number: [id number] Patient Account Number: [id number] Date of Birth/Sex: Treating RN: [birth year] (82 y.o. [name] Primary Care Reuben Knoblock: Einar Crow Other Clinician: Referring Britanny Marksberry: Treating Finley Chevez/Extender: Johny Shears in Treatment: 0 Activities of Daily Living Items Answer Activities of Daily Living (Please select one for each item) Drive Automobile Not Able T Medications ake Need Assistance Use T elephone Completely Able Care for Appearance Completely Able Use T oilet Completely Able Bath / Shower Completely Able Dress Self Completely Able Feed Self Completely Able Walk Need Assistance Get In / Out Bed Completely Able Housework Need Assistance [name], SCARFF E ([id number]) [phone] [email or web address] Page 2 of 5 Prepare Meals Need Assistance Handle Money Need  Assistance Shop for Self Need Assistance Electronic Signature(s) Signed: 08/23/2023 4:35:52 PM By: Angelina Pih Entered By: Angelina Pih on 08/23/2023 08:41:25 -------------------------------------------------------------------------------- Education Screening Details Patient Name: Date of Service: [name] Broach E. 08/23/2023 8:15 A M Medical Record Number: [id number] Patient Account Number: [id number] Date of Birth/Sex: Treating RN: 1941/01/05 (82 y.o. [name] Primary Care Seneca Gadbois: Einar Crow Other Clinician: Referring Aisley Whan: Treating Shaquala Broeker/Extender: Johny Shears in Treatment: 0 Primary Learner Assessed: Patient Learning Preferences/Education Level/Primary Language Learning Preference: Explanation, Demonstration, Video, Communication Board, Printed Material Preferred Language: English Cognitive Barrier Language Barrier: No Translator Needed: No Memory Deficit: No Emotional Barrier: No Cultural/Religious Beliefs Affecting Medical Care: No Physical Barrier Impaired Vision: Yes Glasses Impaired Hearing: No Decreased Hand dexterity: No Knowledge/Comprehension Knowledge Level: High Comprehension Level: High Ability to understand written instructions: High Ability to understand verbal instructions: High Motivation Anxiety Level: Calm Cooperation: Cooperative Education Importance: Acknowledges Need Interest in Health Problems: Asks Questions Perception: Coherent Willingness to Engage in Self-Management High Activities: Readiness to Engage in Self-Management High Activities: Electronic Signature(s) Signed: 08/23/2023 4:35:52 PM By: Angelina Pih Entered By: Angelina Pih on 08/23/2023 08:42:09 Tri, Alben Spittle ([id number]) [id number] Page 3 of 5 -------------------------------------------------------------------------------- Fall Risk Assessment Details Patient Name: Date of  Service: [name], [name] 08/23/2023 8:15 A M Medical Record Number: [id number] Patient Account Number: [id number] Date of Birth/Sex: Treating RN: Jun 06, 1941 (82 y.o. [name] Primary Care Javyon Fontan: Einar Crow Other Clinician: Referring Vernie Piet: Treating Jovani Flury/Extender: Johny Shears in Treatment: 0 Fall Risk Assessment Items Have you had 2 or more falls in the last 12 monthso 0 No Have you had any fall that resulted in injury in the last 12 monthso 0 No FALLS RISK SCREEN History of falling - immediate or within 3 months 0 No Secondary diagnosis (Do you have 2 or more medical diagnoseso) 0 No Ambulatory aid None/bed rest/wheelchair/nurse 0 No  Crutches/cane/walker 15 Yes Furniture 0 No Intravenous therapy Access/Saline/Heparin Lock 0 No Gait/Transferring Normal/ bed rest/ wheelchair 0 Yes Weak (short steps with or without shuffle, stooped but able to lift head while walking, may seek 0 No support from furniture) Impaired (short steps with shuffle, may have difficulty arising from chair, head down, impaired 0 No balance) Mental Status Oriented to own ability 0 Yes Electronic Signature(s) Signed: 08/23/2023 4:35:52 PM By: Angelina Pih Entered By: Angelina Pih on 08/23/2023 08:42:27 -------------------------------------------------------------------------------- Foot Assessment Details Patient Name: Date of Service: [name] Broach E. 08/23/2023 8:15 A M Medical Record Number: [id number] Patient Account Number: [id number] Date of Birth/Sex: Treating RN: 12/12/1940 (82 y.o. [name] Primary Care Adelisa Satterwhite: Einar Crow Other Clinician: Referring Marquette Blodgett: Treating Sukhman Martine/Extender: Johny Shears in Treatment: 0 Foot Assessment Items Site Locations Hayzlee, [place] E ([id number]) [phone] [id number] Page 4 of 5 + = Sensation present, - = Sensation absent, C = Callus, U =  Ulcer R = Redness, W = Warmth, M = Maceration, PU = Pre-ulcerative lesion F = Fissure, S = Swelling, D = Dryness Assessment Right: Left: Other Deformity: No No Prior Foot Ulcer: No No Prior Amputation: No No Charcot Joint: No No Ambulatory Status: Ambulatory With Help Assistance Device: Cane Gait: Steady Electronic Signature(s) Signed: 08/23/2023 4:35:52 PM By: Angelina Pih Entered By: Angelina Pih on 08/23/2023 08:46:48 -------------------------------------------------------------------------------- Nutrition Risk Screening Details Patient Name: Date of Service: DILPREET, LOVINS 08/23/2023 8:15 A M Medical Record Number: [id number] Patient Account Number: [id number] Date of Birth/Sex: Treating RN: Aug 25, 1941 (82 y.o. [name] Primary Care Giovonni Poirier: Einar Crow Other Clinician: Referring Adasyn Mcadams: Treating Carlitos Bottino/Extender: Johny Shears in Treatment: 0 Height (in): 62 Weight (lbs): 160 Body Mass Index (BMI): 29.3 Nutrition Risk Screening Items Score Screening NUTRITION RISK SCREEN: I have an illness or condition that made me change the kind and/or amount of food I eat 0 No I eat fewer than two meals per day 0 No I eat few fruits and vegetables, or milk products 0 No I have three or more drinks of beer, liquor or wine almost every day 0 No I have tooth or mouth problems that make it hard for me to eat 0 No AVIANCE, LANDRON E ([id number]) [id number] Page 5 of 5 I don't always have enough money to buy the food I need 0 No I eat alone most of the time 0 No I take three or more different prescribed or over-the-counter drugs a day 0 No Without wanting to, I have lost or gained 10 pounds in the last six months 0 No I am not always physically able to shop, cook and/or feed myself 0 No Nutrition Protocols Good Risk Protocol 0 No interventions needed Moderate Risk Protocol High Risk Proctocol Risk Level:  Good Risk Score: 0 Electronic Signature(s) Signed: 08/23/2023 4:35:52 PM By: Angelina Pih Entered By: Angelina Pih on 08/23/2023 08:42:33

## 2023-08-23 NOTE — Progress Notes (Signed)
MEHJABEEN, TORRIS ([id number]) [id number] Page 1 of 10 Visit Report for 08/23/2023 Allergy List Details Patient Name: Date of Service: De, Mink [place] 08/23/2023 8:15 A M Medical Record Number: [id number] Patient Account Number: [id number] Date of Birth/Sex: Treating RN: 1941-09-16 (82 y.o. [name] Primary Care Lyric Hoar: Einar Crow Other Clinician: Referring Milind Raether: Treating Deshawnda Acrey/Extender: Johny Shears in Treatment: 0 Allergies Active Allergies ACE Inhibitors egg prednisone risedronate sodium Sulfa (Sulfonamide Antibiotics) sulfasalazine Allergy Notes Electronic Signature(s) Signed: 08/23/2023 8:57:49 AM By: Angelina Pih Entered By: Angelina Pih on 08/23/2023 08:57:49 -------------------------------------------------------------------------------- Arrival Information Details Patient Name: Date of Service: [name]. 08/23/2023 8:15 A M Medical Record Number: [id number] Patient Account Number: [id number] Date of Birth/Sex: Treating RN: Feb 11, 1941 (82 y.o. [name] Primary Care Yesly Gerety: Einar Crow Other Clinician: Referring Chriselda Leppert: Treating Tameeka Luo/Extender: Johny Shears in Treatment: 0 Visit Information Patient Arrived: [place] Arrival Time: 08:33 Accompanied By: family Transfer Assistance: None GERARDETTE, WILLSEY ([id number]) [id number] Page 2 of 10 Patient Identification Verified: Yes Secondary Verification Process Completed: Yes Patient Has Alerts: Yes Patient Alerts: Patient on Blood Thinner Fistula LUE type 2 diabetic Electronic Signature(s) Signed: 08/23/2023 10:56:44 AM By: Angelina Pih Entered By: Angelina Pih on 08/23/2023 10:56:44 -------------------------------------------------------------------------------- Clinic Level of Care Assessment Details Patient Name: Date of Service: ABRIENNE, TURRILL  08/23/2023 8:15 A M Medical Record Number: [id number] Patient Account Number: [id number] Date of Birth/Sex: Treating RN: [birth year] (82 y.o. [name] Primary Care Alyannah Sanks: Einar Crow Other Clinician: Referring Keeton Kassebaum: Treating Yvonnie Schinke/Extender: Johny Shears in Treatment: 0 Clinic Level of Care Assessment Items TOOL 1 Quantity Score []  - 0 Use when EandM and Procedure is performed on INITIAL visit ASSESSMENTS - Nursing Assessment / Reassessment X- 1 20 General Physical Exam (combine w/ comprehensive assessment (listed just below) when performed on new pt. evals) X- 1 25 Comprehensive Assessment (HX, ROS, Risk Assessments, Wounds Hx, etc.) ASSESSMENTS - Wound and Skin Assessment / Reassessment []  - 0 Dermatologic / Skin Assessment (not related to wound area) ASSESSMENTS - Ostomy and/or Continence Assessment and Care []  - 0 Incontinence Assessment and Management []  - 0 Ostomy Care Assessment and Management (repouching, etc.) PROCESS - Coordination of Care X - Simple Patient / Family Education for ongoing care 1 15 []  - 0 Complex (extensive) Patient / Family Education for ongoing care X- 1 10 Staff obtains Chiropractor, Records, T Results / Process Orders est []  - 0 Staff telephones HHA, Nursing Homes / Clarify orders / etc []  - 0 Routine Transfer to another Facility (non-emergent condition) []  - 0 Routine Hospital Admission (non-emergent condition) X- 1 15 New Admissions / Manufacturing engineer / Ordering NPWT Apligraf, etc. , []  - 0 Emergency Hospital Admission (emergent condition) PROCESS - Special Needs []  - 0 Pediatric / Minor Patient Management []  - 0 Isolation Patient Management []  - 0 Hearing / Language / Visual special needs []  - 0 Assessment of Community assistance (transportation, D/C planning, etc.) []  - 0 Additional assistance / Altered mentation SULEY, KINCADE E ([id number]) [phone].pdf  Page 3 of 10 []  - 0 Support Surface(s) Assessment (bed, cushion, seat, etc.) INTERVENTIONS - Miscellaneous []  - 0 External ear exam []  - 0 Patient Transfer (multiple staff / Nurse, adult / Similar devices) []  - 0 Simple Staple / Suture removal (25 or less) []  - 0 Complex Staple / Suture removal (26 or more) []  - 0 Hypo/Hyperglycemic Management (do not check if billed separately) []  - 0  Ankle / Brachial Index (ABI) - do not check if billed separately Has the patient been seen at the hospital within the last three years: Yes Total Score: 85 Level Of Care: New/Established - Level 3 Electronic Signature(s) Signed: 08/23/2023 4:35:52 PM By: Angelina Pih Entered By: Angelina Pih on 08/23/2023 10:07:14 -------------------------------------------------------------------------------- Encounter Discharge Information Details Patient Name: Date of Service: [name]. 08/23/2023 8:15 A M Medical Record Number: [id number] Patient Account Number: [id number] Date of Birth/Sex: Treating RN: [birth year] (82 y.o. [name] Primary Care Shakeerah Gradel: Einar Crow Other Clinician: Referring Owais Pruett: Treating Tvisha Schwoerer/Extender: Johny Shears in Treatment: 0 Encounter Discharge Information Items Post Procedure Vitals Discharge Condition: Stable Temperature (F): 98.1 Ambulatory Status: Cane Pulse (bpm): 62 Discharge Destination: Home Respiratory Rate (breaths/min): 18 Transportation: Private Auto Blood Pressure (mmHg): 161/44 Accompanied By: self family in [place] Schedule Follow-up Appointment: Yes Clinical Summary of Care: Electronic Signature(s) Signed: 08/23/2023 10:12:09 AM By: Angelina Pih Entered By: Angelina Pih on 08/23/2023 10:12:08 -------------------------------------------------------------------------------- Lower Extremity Assessment Details Patient Name: Date of Service: DAMA, BOGGAN 08/23/2023 8:15 A LAURALYN, HEART  ([id number]) [id number] Page 4 of 10 Medical Record Number: [id number] Patient Account Number: [id number] Date of Birth/Sex: Treating RN: [birth year] (82 y.o. [name] Primary Care Jeweliana Dudgeon: Einar Crow Other Clinician: Referring Rashema Seawright: Treating Evangaline Jou/Extender: Johny Shears in Treatment: 0 Edema Assessment Assessed: [Left: No] [Right: No] Edema: [Left: N] [Right: o] Calf Left: Right: Point of Measurement: 33 cm From Medial Instep 31 cm Ankle Left: Right: Point of Measurement: 11 cm From Medial Instep 20.5 cm Vascular Assessment Pulses: Dorsalis Pedis Palpable: [Right:Yes] Posterior Tibial Palpable: [Right:Yes] Extremity colors, hair growth, and conditions: Extremity Color: [Right:Normal] Hair Growth on Extremity: [Right:No] Temperature of Extremity: [Right:Warm > 3 seconds] Toe Nail Assessment Left: Right: Thick: No Discolored: No Deformed: No Improper Length and Hygiene: No Electronic Signature(s) Signed: 08/23/2023 4:35:52 PM By: Angelina Pih Entered By: Angelina Pih on 08/23/2023 08:52:50 -------------------------------------------------------------------------------- Multi Wound Chart Details Patient Name: Date of Service: [name]. 08/23/2023 8:15 A M Medical Record Number: [id number] Patient Account Number: [id number] Date of Birth/Sex: Treating RN: Feb 09, 1941 (82 y.o. [name] Primary Care Edson Deridder: Einar Crow Other Clinician: Referring Emilliano Dilworth: Treating Jan Walters/Extender: Johny Shears in Treatment: 0 Vital Signs Height(in): 62 Pulse(bpm): 62 Weight(lbs): 160 Blood Pressure(mmHg): 161/44 Body Mass Index(BMI): 29.3 Temperature(F): 98.1 Respiratory Rate(breaths/min): 18 [place], Delsy E ([id number]) [id number] Page 5 of 10 [1:Photos:] [N/A:N/A] Right, Lateral Lower Leg N/A N/A Wound Location: Skin  Tear/Laceration N/A N/A Wounding Event: Skin Tear N/A N/A Primary Etiology: Anemia, Asthma, Chronic Obstructive N/A N/A Comorbid History: Pulmonary Disease (COPD), Sleep Apnea, Arrhythmia, Congestive Heart Failure, Hypertension, Type II Diabetes, End Stage Renal Disease, Gout 08/21/2023 N/A N/A Date Acquired: 0 N/A N/A Weeks of Treatment: Open N/A N/A Wound Status: No N/A N/A Wound Recurrence: 6.1x6.2x0.5 N/A N/A Measurements L x W x D (cm) 29.704 N/A N/A A (cm) : rea 14.852 N/A N/A Volume (cm) : 12 Starting Position 1 (o'clock): 2 Ending Position 1 (o'clock): 0.5 Maximum Distance 1 (cm): Yes N/A N/A Undermining: Full Thickness Without Exposed N/A N/A Classification: Support Structures Medium N/A N/A Exudate A mount: Serosanguineous N/A N/A Exudate Type: red, brown N/A N/A Exudate Color: Small (1-33%) N/A N/A Granulation A mount: Red N/A N/A Granulation Quality: Large (67-100%) N/A N/A Necrotic A mount: Eschar, Adherent Slough N/A N/A Necrotic Tissue: Fat Layer (Subcutaneous Tissue): Yes N/A N/A Exposed Structures: None N/A N/A  Epithelialization: Debridement - Excisional N/A N/A Debridement: Pre-procedure Verification/Time Out 09:19 N/A N/A Taken: Lidocaine 4% Topical Solution N/A N/A Pain Control: Necrotic/Eschar, Subcutaneous, N/A N/A Tissue Debrided: Slough Skin/Subcutaneous Tissue N/A N/A Level: 29.69 N/A N/A Debridement A (sq cm): rea Curette N/A N/A Instrument: Moderate N/A N/A Bleeding: Pressure N/A N/A Hemostasis Achieved: Debridement Treatment Response: Procedure was tolerated well N/A N/A Post Debridement Measurements L x 6.1x6.2x0.5 N/A N/A W x D (cm) 14.852 N/A N/A Post Debridement Volume: (cm) Debridement N/A N/A Procedures Performed: Treatment Notes Electronic Signature(s) Signed: 08/23/2023 4:35:52 PM By: Angelina Pih Entered By: Angelina Pih on 08/23/2023 09:26:44 Graveline, Alben Spittle ([id number])  [id number] Page 6 of 10 -------------------------------------------------------------------------------- Multi-Disciplinary Care Plan Details Patient Name: Date of Service: [place], [place] 08/23/2023 8:15 A M Medical Record Number: [id number] Patient Account Number: [id number] Date of Birth/Sex: Treating RN: 08/11/1941 (82 y.o. [name] Primary Care Emanuella Nickle: Einar Crow Other Clinician: Referring Amarissa Koerner: Treating Rea Reser/Extender: Johny Shears in Treatment: 0 Active Inactive Necrotic Tissue Nursing Diagnoses: Impaired tissue integrity related to necrotic/devitalized tissue Knowledge deficit related to management of necrotic/devitalized tissue Goals: Necrotic/devitalized tissue will be minimized in the wound bed Date Initiated: 08/23/2023 Target Resolution Date: 10/18/2023 Goal Status: Active Patient/caregiver will verbalize understanding of reason and process for debridement of necrotic tissue Date Initiated: 08/23/2023 Date Inactivated: 08/23/2023 Target Resolution Date: 08/23/2023 Goal Status: Met Interventions: Assess patient pain level pre-, during and post procedure and prior to discharge Provide education on necrotic tissue and debridement process Treatment Activities: Apply topical anesthetic as ordered : 08/23/2023 Excisional debridement : 08/23/2023 Notes: Wound/Skin Impairment Nursing Diagnoses: Impaired tissue integrity Knowledge deficit related to ulceration/compromised skin integrity Goals: Ulcer/skin breakdown will have a volume reduction of 30% by week 4 Date Initiated: 08/23/2023 Target Resolution Date: 09/20/2023 Goal Status: Active Ulcer/skin breakdown will have a volume reduction of 50% by week 8 Date Initiated: 08/23/2023 Target Resolution Date: 10/18/2023 Goal Status: Active Ulcer/skin breakdown will have a volume reduction of 80% by week 12 Date Initiated: 08/23/2023 Target  Resolution Date: 11/15/2023 Goal Status: Active Ulcer/skin breakdown will heal within 14 weeks Date Initiated: 08/23/2023 Target Resolution Date: 11/29/2023 Goal Status: Active Interventions: Assess patient/caregiver ability to obtain necessary supplies Assess patient/caregiver ability to perform ulcer/skin care regimen upon admission and as needed Assess ulceration(s) every visit Provide education on ulcer and skin care Notes: Electronic Signature(s) Signed: 08/23/2023 10:08:54 AM By: Angelina Pih Entered By: Angelina Pih on 08/23/2023 10:08:54 Birkel, Alben Spittle ([id number]) [id number] Page 7 of 10 -------------------------------------------------------------------------------- Pain Assessment Details Patient Name: Date of Service: PEYTYN, PETRUCELLI 08/23/2023 8:15 A M Medical Record Number: [id number] Patient Account Number: [id number] Date of Birth/Sex: Treating RN: [birth year] (82 y.o. [name] Primary Care Chantell Kunkler: Einar Crow Other Clinician: Referring Keats Kingry: Treating Jerremy Maione/Extender: Johny Shears in Treatment: 0 Active Problems Location of Pain Severity and Description of Pain Patient Has Paino No Site Locations Rate the pain. Current Pain Level: 0 Pain Management and Medication Current Pain Management: Electronic Signature(s) Signed: 08/23/2023 4:35:52 PM By: Angelina Pih Entered By: Angelina Pih on 08/23/2023 08:33:55 -------------------------------------------------------------------------------- Patient/Caregiver Education Details Patient Name: Date of Service: Vivianne Master 11/21/2024andnbsp8:15 A M Medical Record Number: [id number] Patient Account Number: [id number] Date of Birth/Gender: Treating RN: Mar 09, 1941 (82 y.o. [name] Primary Care Physician: Einar Crow Other Clinician: Referring Physician: Treating Physician/Extender: Johny Shears in Treatment: 0 Pion, Alben Spittle ([id number]) [id number] Page 8 of 10 Education Assessment Education  Provided To: Patient Education Topics Provided Welcome [facility]New [facility] Packet: o Handouts: [facility] P[facility] form, Welcome T [facility] o Methods: Explain/Verbal Responses: State content correctly Wound Debridement: Handouts: Wound Debridement Methods: Explain/Verbal Responses: State content correctly Wound/Skin Impairment: Handouts: Caring for Your Ulcer Methods: Explain/Verbal Responses: State content correctly Electronic Signature(s) Signed: 08/23/2023 4:35:52 PM By: Angelina Pih Entered By: Angelina Pih on 08/23/2023 10:10:45 -------------------------------------------------------------------------------- Wound Assessment Details Patient Name: Date of Service: [name]. 08/23/2023 8:15 A M Medical Record Number: [id number] Patient Account Number: [id number] Date of Birth/Sex: Treating RN: 12-16-1940 (82 y.o. [name] Primary Care Tris Howell: Einar Crow Other Clinician: Referring Aylinn Rydberg: Treating Leesha Veno/Extender: Johny Shears in Treatment: 0 Wound Status Wound Number: 1 Primary Skin T ear Etiology: Wound Location: Right, Lateral Lower Leg Wound Open Wounding Event: Skin Tear/Laceration Status: Date Acquired: 08/21/2023 Comorbid Anemia, Asthma, Chronic Obstructive Pulmonary Disease (COPD), Weeks Of Treatment: 0 History: Sleep Apnea, Arrhythmia, Congestive Heart Failure, Hypertension, Clustered Wound: No Type II Diabetes, End Stage Renal Disease, Gout Photos Wound Measurements Dacy, Aneyah E ([id number]) Length: (cm) 6.1 Width: (cm) 6.2 Depth: (cm) 0.5 Area: (cm) 29.704 Volume: (cm) 14.852 [id number] Page 9 of 10 % Reduction in Area: % Reduction in Volume: Epithelialization: None Tunneling:  No Undermining: Yes Starting Position (o'clock): 12 Ending Position (o'clock): 2 Maximum Distance: (cm) 0.5 Wound Description Classification: Full Thickness Without Exposed Support Structures Exudate Amount: Medium Exudate Type: Serosanguineous Exudate Color: red, brown Foul Odor After Cleansing: No Slough/Fibrino Yes Wound Bed Granulation Amount: Small (1-33%) Exposed Structure Granulation Quality: Red Fat Layer (Subcutaneous Tissue) Exposed: Yes Necrotic Amount: Large (67-100%) Necrotic Quality: Eschar, Adherent Slough Treatment Notes Wound #1 (Lower Leg) Wound Laterality: Right, Lateral Cleanser [place] Ancillary Kit - 15 Day Supply Discharge Instruction: Use supplies as instructed; Kit contains: (15) Saline Bullets; (15) 3x3 Gauze; 15 pr Gloves Wound Cleanser Discharge Instruction: Wash your hands with soap and water. Remove old dressing, discard into plastic bag and place into trash. Cleanse the wound with Wound Cleanser prior to applying a clean dressing using gauze sponges, not tissues or cotton balls. Do not scrub or use excessive force. Pat dry using gauze sponges, not tissue or cotton balls. Peri-Wound Care Topical Primary Dressing Xeroform 5x9-HBD (in/in) Discharge Instruction: cut small strip to pack into area of depth and undermining Apply Xeroform 5x9-HBD (in/in) as directed Secondary Dressing ABD Pad 5x9 (in/in) Discharge Instruction: Cover with ABD pad Secured With Medipore T - 20M Medipore H Soft Cloth Surgical T ape ape, 2x2 (in/yd) Kerlix Roll Sterile or Non-Sterile 6-ply 4.5x4 (yd/yd) Discharge Instruction: Apply Kerlix as directed Compression Wrap Compression Stockings Add-Ons Electronic Signature(s) Signed: 08/23/2023 4:35:52 PM By: Angelina Pih Previous Signature: 08/23/2023 9:13:28 AM Version By: Allen Derry PA-C Entered By: Angelina Pih on 08/23/2023 09:26:31 Kelter, Alben Spittle ([id number]) [id number] Page 10 of  10 -------------------------------------------------------------------------------- Vitals Details Patient Name: Date of Service: [place], [place] 08/23/2023 8:15 A M Medical Record Number: [id number] Patient Account Number: [id number] Date of Birth/Sex: Treating RN: 04-27-1941 (82 y.o. [name] Primary Care Atley Neubert: Einar Crow Other Clinician: Referring Schelly Chuba: Treating Deronda Christian/Extender: Johny Shears in Treatment: 0 Vital Signs Time Taken: 08:30 Temperature (F): 98.1 Height (in): 62 Pulse (bpm): 62 Source: Stated Respiratory Rate (breaths/min): 18 Weight (lbs): 160 Blood Pressure (mmHg): 161/44 Source: Stated Reference Range: 80 - 120 mg / dl Body Mass Index (BMI): 29.3 Electronic Signature(s) Signed: 08/23/2023 4:35:52 PM By: Roger Shelter,  Caitlin Entered By: Angelina Pih on 08/23/2023 08:34:16

## 2023-08-24 NOTE — Progress Notes (Signed)
SATANYA, ZIEMKE ([id number]) [id number] Page 1 of 10 Visit Report for 08/23/2023 Chief Complaint Document Details Patient Name: Date of Service: [name], [name] 08/23/2023 8:15 A M Medical Record Number: [id number] Patient Account Number: [id number] Date of Birth/Sex: Treating RN: December 30, 1940 (82 y.o. [name] Primary Care Provider: Einar Crow Other Clinician: Referring Provider: Treating Provider/Extender: Johny Shears in Treatment: 0 Information Obtained from: Patient Chief Complaint Right LE Ulcer Electronic Signature(s) Signed: 08/23/2023 9:12:57 AM By: Allen Derry PA-C Entered By: Allen Derry on 08/23/2023 06:12:57 -------------------------------------------------------------------------------- Debridement Details Patient Name: Date of Service: [name] Broach E. 08/23/2023 8:15 A M Medical Record Number: [id number] Patient Account Number: [id number] Date of Birth/Sex: Treating RN: [birth year] (82 y.o. [name] Primary Care Provider: Einar Crow Other Clinician: Referring Provider: Treating Provider/Extender: Johny Shears in Treatment: 0 Debridement Performed for Assessment: Wound #1 Right,Lateral Lower Leg Performed By: Physician Allen Derry, PA-C The following information was scribed by: Angelina Pih The information was scribed for: Allen Derry Debridement Type: Debridement Level of Consciousness (Pre-procedure): Awake and Alert Pre-procedure Verification/Time Out Yes - 09:19 Taken: Pain Control: Lidocaine 4% T opical Solution Percent of Wound Bed Debrided: 100% T Area Debrided (cm): otal 29.69 Tissue and other material debrided: Viable, Non-Viable, Eschar, Slough, Subcutaneous, Slough Level: Skin/Subcutaneous Tissue Debridement Description: Excisional Instrument: Curette Bleeding: Moderate Hemostasis Achieved: Pressure Response to Treatment: Procedure was  tolerated well Level of Consciousness [name], [name] ([id number]) [id number] Page 2 of 10 Level of Consciousness (Post- Awake and Alert procedure): Post Debridement Measurements of Total Wound Length: (cm) 6.1 Width: (cm) 6.2 Depth: (cm) 0.5 Volume: (cm) 14.852 Character of Wound/Ulcer Post Debridement: Stable Post Procedure Diagnosis Same as Pre-procedure Electronic Signature(s) Signed: 08/23/2023 4:35:52 PM By: Angelina Pih Signed: 08/23/2023 6:01:43 PM By: Allen Derry PA-C Entered By: Angelina Pih on 08/23/2023 06:26:04 -------------------------------------------------------------------------------- HPI Details Patient Name: Date of Service: [name], [name] Rector E. 08/23/2023 8:15 A M Medical Record Number: [id number] Patient Account Number: [id number] Date of Birth/Sex: Treating RN: 17-Dec-1940 (82 y.o. [name] Primary Care Provider: Einar Crow Other Clinician: Referring Provider: Treating Provider/Extender: Johny Shears in Treatment: 0 History of Present Illness HPI Description: 08-23-2023 upon evaluation today patient presents for initial inspection here in our clinic due to an issue that she has been having with a skin tear of the right lateral lower extremity where she tells me that a storm door was going back into her leg causing a significant skin tear. With her leg hurting she unfortunately noted around July 23, 2023 that she was having a regular heart rate where it was jumping up going back down and it was so erratic and irregular she ended up calling an ambulance to take her to the hospital this ended up with a hospital stent that went from the July 23, 2023 through August 06, 2023. During the course of that hospital stay on 08-02-2023 she had an OR debridement it sounds like she may have had a significant hematoma based on what I am seeing and hearing. Following that time she has been  using some Xeroform here and there and that has helped to some degree right now she tells me she has not been using that as she did not have any more of that at this point. She did have an x-ray on 07-30-2023 which was negative for any signs of fracture or acute bony abnormality. Patient does have a history of diabetes mellitus type 2,  hypertension, COPD, chronic kidney disease stage IV, atrial fibrillation, long-term use of anticoagulant therapy. Electronic Signature(s) Signed: 08/23/2023 5:59:28 PM By: Allen Derry PA-C Entered By: Allen Derry on 08/23/2023 14:59:28 Physical Exam Details -------------------------------------------------------------------------------- [name] ([id number]) [id number] Page 3 of 10 Patient Name: Date of Service: [name] 08/23/2023 8:15 A M Medical Record Number: [id number] Patient Account Number: [id number] Date of Birth/Sex: Treating RN: [birth year] (82 y.o. [name] Primary Care Provider: Einar Crow Other Clinician: Referring Provider: Treating Provider/Extender: Johny Shears in Treatment: 0 Constitutional sitting or standing blood pressure is within target range for patient.. pulse regular and within target range for patient.Marland Kitchen respirations regular, non-labored and within target range for patient.Marland Kitchen temperature within target range for patient.. Well-nourished and well-hydrated in no acute distress. Eyes conjunctiva clear no eyelid edema noted. pupils equal round and reactive to light and accommodation. Ears, Nose, Mouth, and Throat no gross abnormality of ear auricles or external auditory canals. normal hearing noted during conversation. mucus membranes moist. Respiratory normal breathing without difficulty. Cardiovascular 2+ dorsalis pedis/posterior tibialis pulses. no clubbing, cyanosis, significant edema, <3 sec cap refill. Musculoskeletal normal gait and posture. no  significant deformity or arthritic changes, no loss or range of motion, no clubbing. Psychiatric this patient is able to make decisions and demonstrates good insight into disease process. Alert and Oriented x 3. pleasant and cooperative. Notes Upon inspection patient's wound bed actually showed signs of necrotic tissue noted over the surface of the wound especially more medial. This is where it actually ended up being the deepest. I did perform debridement clearway necrotic debris she tolerated that today without any significant pain or complication I was able to do this not completely but pretty thoroughly without causing significant trauma or bleeding which again with the Eliquis and wanted to be very careful of it and I want her to over bleed as far as that is concerned and cause other issues. Electronic Signature(s) Signed: 08/23/2023 6:00:11 PM By: Allen Derry PA-C Entered By: Allen Derry on 08/23/2023 15:00:11 -------------------------------------------------------------------------------- Physician Orders Details Patient Name: Date of Service: [name], [name] E. 08/23/2023 8:15 A M Medical Record Number: [id number] Patient Account Number: [id number] Date of Birth/Sex: Treating RN: Sep 23, 1941 (82 y.o. [name] Primary Care Provider: Einar Crow Other Clinician: Referring Provider: Treating Provider/Extender: Johny Shears in Treatment: 0 The following information was scribed by: Angelina Pih The information was scribed for: Allen Derry Verbal / Phone Orders: No Diagnosis Coding ICD-10 Coding Code Description [phone] Laceration without foreign body, right lower leg, initial encounter E11.622 Type 2 diabetes mellitus with other skin ulcer L97.812 Non-pressure chronic ulcer of other part of right lower leg with fat layer exposed I10 Essential (primary) hypertension J44.89 Other specified chronic obstructive pulmonary disease LALLIE, LATOURETTE  ([id number]) [id number] Page 4 of 10 N18.4 Chronic kidney disease, stage 4 (severe) I48.0 Paroxysmal atrial fibrillation Z79.01 Long term (current) use of anticoagulants Follow-up Appointments Return Appointment in 1 week. Bathing/ Shower/ Hygiene May shower; gently cleanse wound with antibacterial soap, rinse and pat dry prior to dressing wounds - Use Dial original GOLD soap to clean wound after showering No tub bath. Anesthetic (Use 'Patient Medications' Section for Anesthetic Order Entry) Lidocaine applied to wound bed Edema Control - Orders / Instructions Elevate, Exercise Daily and A void Standing for Long Periods of Time. Elevate legs to the level of the heart and pump ankles as often as possible Elevate leg(s) parallel to the floor when  sitting. DO YOUR BEST to sleep in the bed at night. DO NOT sleep in your recliner. Long hours of sitting in a recliner leads to swelling of the legs and/or potential wounds on your backside. Wound Treatment Wound #1 - Lower Leg Wound Laterality: Right, Lateral Cleanser: Byram Ancillary Kit - 15 Day Supply (DME) (Generic) 1 x Per Day/15 Days Discharge Instructions: Use supplies as instructed; Kit contains: (15) Saline Bullets; (15) 3x3 Gauze; 15 pr Gloves Cleanser: Wound Cleanser 1 x Per Day/15 Days Discharge Instructions: Wash your hands with soap and water. Remove old dressing, discard into plastic bag and place into trash. Cleanse the wound with Wound Cleanser prior to applying a clean dressing using gauze sponges, not tissues or cotton balls. Do not scrub or use excessive force. Pat dry using gauze sponges, not tissue or cotton balls. Prim Dressing: Xeroform 5x9-HBD (in/in) 1 x Per Day/15 Days ary Discharge Instructions: cut small strip to pack into area of depth and undermining Apply Xeroform 5x9-HBD (in/in) as directed Secondary Dressing: ABD Pad 5x9 (in/in) (DME) (Generic) 1 x Per Day/15 Days Discharge  Instructions: Cover with ABD pad Secured With: Medipore T - 47M Medipore H Soft Cloth Surgical T ape ape, 2x2 (in/yd) (DME) (Generic) 1 x Per Day/15 Days Secured With: Kerlix Roll Sterile or Non-Sterile 6-ply 4.5x4 (yd/yd) (DME) (Generic) 1 x Per Day/15 Days Discharge Instructions: Apply Kerlix as directed Electronic Signature(s) Signed: 08/23/2023 4:35:52 PM By: Angelina Pih Signed: 08/23/2023 6:01:43 PM By: Allen Derry PA-C Entered By: Angelina Pih on 08/23/2023 06:42:29 -------------------------------------------------------------------------------- Problem List Details Patient Name: Date of Service: [name] Broach E. 08/23/2023 8:15 A M Medical Record Number: [id number] Patient Account Number: [id number] Date of Birth/Sex: Treating RN: 27-Oct-1940 (82 y.o. [name] Primary Care Provider: Einar Crow Other Clinician: Referring Provider: Treating Provider/Extender: Johny Shears in Treatment: 0 Active Problems ICD-10 SAMRIDDHI, SMELLEY ([id number]) [id number] Page 5 of 10 Encounter Code Description Active Date MDM Diagnosis S81.811A Laceration without foreign body, right lower leg, initial encounter 08/23/2023 No Yes E11.622 Type 2 diabetes mellitus with other skin ulcer 08/23/2023 No Yes L97.812 Non-pressure chronic ulcer of other part of right lower leg with fat layer 08/23/2023 No Yes exposed I10 Essential (primary) hypertension 08/23/2023 No Yes J44.89 Other specified chronic obstructive pulmonary disease 08/23/2023 No Yes N18.4 Chronic kidney disease, stage 4 (severe) 08/23/2023 No Yes I48.0 Paroxysmal atrial fibrillation 08/23/2023 No Yes Z79.01 Long term (current) use of anticoagulants 08/23/2023 No Yes Inactive Problems Resolved Problems Electronic Signature(s) Signed: 08/23/2023 9:12:46 AM By: Allen Derry PA-C Entered By: Allen Derry on 08/23/2023  06:12:46 -------------------------------------------------------------------------------- Progress Note Details Patient Name: Date of Service: Calixto, [name] Rector E. 08/23/2023 8:15 A M Medical Record Number: [id number] Patient Account Number: [id number] Date of Birth/Sex: Treating RN: [birth year] (82 y.o. [name] Primary Care Provider: Einar Crow Other Clinician: Referring Provider: Treating Provider/Extender: Johny Shears in Treatment: 0 Subjective Chief Complaint Information obtained from Patient Right LE Ulcer History of Present Illness (HPI) 08-23-2023 upon evaluation today patient presents for initial inspection here in our clinic due to an issue that she has been having with a skin tear of the right YALANDA, COMEAU ([id number]) [id number] Page 6 of 10 lateral lower extremity where she tells me that a storm door was going back into her leg causing a significant skin tear. With her leg hurting she unfortunately noted around July 23, 2023 that she was having a regular heart rate where it was jumping  up going back down and it was so erratic and irregular she ended up calling an ambulance to take her to the hospital this ended up with a hospital stent that went from the July 23, 2023 through August 06, 2023. During the course of that hospital stay on 08-02-2023 she had an OR debridement it sounds like she may have had a significant hematoma based on what I am seeing and hearing. Following that time she has been using some Xeroform here and there and that has helped to some degree right now she tells me she has not been using that as she did not have any more of that at this point. She did have an x-ray on 07-30-2023 which was negative for any signs of fracture or acute bony abnormality. Patient does have a history of diabetes mellitus type 2, hypertension, COPD, chronic kidney disease stage IV, atrial fibrillation,  long-term use of anticoagulant therapy. Patient History Information obtained from Patient, Chart. Allergies ACE Inhibitors, egg, prednisone, risedronate sodium, Sulfa (Sulfonamide Antibiotics), sulfasalazine Social History Never smoker, Alcohol Use - Never, Drug Use - No History, Caffeine Use - Daily - coffee. 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 Blood sugar is not tested. 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 Eyes Complains or has symptoms of Glasses / Contacts. Ear/Nose/Mouth/Throat chronic bronchitis Gastrointestinal Complains or has symptoms of Frequent diarrhea - if she eats something with egg in it, Nausea, Vomiting. Endocrine Complains or has symptoms of Thyroid disease, thyroid removed Immunological Denies complaints or symptoms of Hives, Itching. Integumentary (Skin) Complains or has symptoms of Breakdown, Swelling. Neurologic Denies complaints or symptoms of Numbness/parasthesias, Focal/Weakness. Psychiatric Denies complaints or symptoms of Anxiety, Claustrophobia. Objective Constitutional sitting or standing blood pressure is within target range for patient.. pulse regular and within target range for patient.Marland Kitchen respirations regular, non-labored and within target range for patient.Marland Kitchen temperature within target range  for patient.. Well-nourished and well-hydrated in no acute distress. Vitals Time Taken: 8:30 AM, Height: 62 in, Source: Stated, Weight: 160 lbs, Source: Stated, BMI: 29.3, Temperature: 98.1 F, Pulse: 62 bpm, Respiratory Rate: 18 breaths/min, Blood Pressure: 161/44 mmHg. Eyes conjunctiva clear no eyelid edema noted. pupils equal round and reactive to light and accommodation. Ears, Nose, Mouth, and Throat no gross abnormality of ear auricles or external auditory canals. normal hearing noted during conversation. mucus membranes moist. Respiratory normal breathing without difficulty. [name], [name] ([id number]) [id number] Page 7 of 10 Cardiovascular 2+ dorsalis pedis/posterior tibialis pulses. no clubbing, cyanosis, significant edema, Musculoskeletal normal gait and posture. no significant deformity or arthritic changes, no loss or range of motion, no clubbing. Psychiatric this patient is able to make decisions and demonstrates good insight into disease process. Alert and Oriented x 3. pleasant and cooperative. General Notes: Upon inspection patient's wound bed actually showed signs of necrotic tissue noted over the surface of the wound especially more medial. This is where it actually ended up being the deepest. I did perform debridement clearway necrotic debris she tolerated that  today without any significant pain or complication I was able to do this not completely but pretty thoroughly without causing significant trauma or bleeding which again with the Eliquis and wanted to be very careful of it and I want her to over bleed as far as that is concerned and cause other issues. Integumentary (Hair, Skin) Wound #1 status is Open. Original cause of wound was Skin T ear/Laceration. The date acquired was: 08/21/2023. The wound is located on the Right,Lateral Lower Leg. The wound measures 6.1cm length x 6.2cm width x 0.5cm depth; 29.704cm^2 area and 14.852cm^3 volume. There  is Fat Layer (Subcutaneous Tissue) exposed. There is no tunneling noted, however, there is undermining starting at 12:00 and ending at 2:00 with a maximum distance of 0.5cm. There is a medium amount of serosanguineous drainage noted. There is small (1-33%) red granulation within the wound bed. There is a large (67-100%) amount of necrotic tissue within the wound bed including Eschar and Adherent Slough. Assessment Active Problems ICD-10 Laceration without foreign body, right lower leg, initial encounter Type 2 diabetes mellitus with other skin ulcer Non-pressure chronic ulcer of other part of right lower leg with fat layer exposed Essential (primary) hypertension Other specified chronic obstructive pulmonary disease Chronic kidney disease, stage 4 (severe) Paroxysmal atrial fibrillation Long term (current) use of anticoagulants Procedures Wound #1 Pre-procedure diagnosis of Wound #1 is a Skin T located on the Right,Lateral Lower Leg . There was a Excisional Skin/Subcutaneous Tissue Debridement ear with a total area of 29.69 sq cm performed by Allen Derry, PA-C. With the following instrument(s): Curette to remove Viable and Non-Viable tissue/material. Material removed includes Eschar, Subcutaneous Tissue, and Slough after achieving pain control using Lidocaine 4% T opical Solution. No specimens were taken. A time out was conducted at 09:19, prior to the start of the procedure. A Moderate amount of bleeding was controlled with Pressure. The procedure was tolerated well. Post Debridement Measurements: 6.1cm length x 6.2cm width x 0.5cm depth; 14.852cm^3 volume. Character of Wound/Ulcer Post Debridement is stable. Post procedure Diagnosis Wound #1: Same as Pre-Procedure Plan Follow-up Appointments: Return Appointment in 1 week. Bathing/ Shower/ Hygiene: May shower; gently cleanse wound with antibacterial soap, rinse and pat dry prior to dressing wounds - Use Dial original GOLD soap to clean  wound after showering No tub bath. Anesthetic (Use 'Patient Medications' Section for Anesthetic Order Entry): Lidocaine applied to wound bed Edema Control - Orders / Instructions: Elevate, Exercise Daily and Avoid Standing for Long Periods of Time. Elevate legs to the level of the heart and pump ankles as often as possible Elevate leg(s) parallel to the floor when sitting. DO YOUR BEST to sleep in the bed at night. DO NOT sleep in your recliner. Long hours of sitting in a recliner leads to swelling of the legs and/or potential wounds on your backside. WOUND #1: - Lower Leg Wound Laterality: Right, Lateral Cleanser: Byram Ancillary Kit - 15 Day Supply (DME) (Generic) 1 x Per Day/15 Days Discharge Instructions: Use supplies as instructed; Kit contains: (15) Saline Bullets; (15) 3x3 Gauze; 15 pr Gloves Cleanser: Wound Cleanser 1 x Per Day/15 Days Discharge Instructions: Wash your hands with soap and water. Remove old dressing, discard into plastic bag and place into trash. Cleanse the wound with Wound Cleanser prior to applying a clean dressing using gauze sponges, not tissues or cotton balls. Do not scrub or use excessive force. Pat dry using gauze sponges, not tissue or cotton balls. Prim Dressing: Xeroform 5x9-HBD (in/in) 1 x Per  Day/15 Days ary Discharge Instructions: cut small strip to pack into area of depth and undermining Apply Xeroform 5x9-HBD (in/in) as directed Secondary Dressing: ABD Pad 5x9 (in/in) (DME) (Generic) 1 x Per Day/15 Days Discharge Instructions: Cover with ABD pad Secured With: Medipore T - 52M Medipore H Soft Cloth Surgical T ape ape, 2x2 (in/yd) (DME) (Generic) 1 x Per Day/15 Days Secured With: Kerlix Roll Sterile or Non-Sterile 6-ply 4.5x4 (yd/yd) (DME) (Generic) 1 x Per Day/15 Days [name], [name] ([id number]) [id number] Page 8 of 10 Discharge Instructions: Apply Kerlix as directed 1. I would recommend based on what we are seeing that we  have the patient go to continue to monitor for any signs of infection or worsening. Based on what I am seeing right now I do believe that the best thing may be for this week to utilize the Xeroform gauze dressing. I think this is probably can be the optimal treatment plan and this will help soften up some of the necrotic debris. 2. I am going to recommend as well that we have the patient continue to monitor for any signs of infection or worsening. With that being said I do believe that the Xeroform gauze is really doing quite well. 3. I am also going to suggest that we have the patient continue with ABD pad to cover and roll gauze to secure in place. We will see patient back for reevaluation in 1 week here in the clinic. If anything worsens or changes patient will contact our office for additional recommendations. Electronic Signature(s) Signed: 08/23/2023 6:00:58 PM By: Allen Derry PA-C Entered By: Allen Derry on 08/23/2023 15:00:57 -------------------------------------------------------------------------------- ROS/PFSH Details Patient Name: Date of Service: [name], [name] E. 08/23/2023 8:15 A M Medical Record Number: [id number] Patient Account Number: [id number] Date of Birth/Sex: Treating RN: Mar 01, 1941 (82 y.o. [name] Primary Care Provider: Einar Crow Other Clinician: Referring Provider: Treating Provider/Extender: Johny Shears in Treatment: 0 Information Obtained From Patient Chart Constitutional Symptoms (General Health) Complaints and Symptoms: Positive for: Marked Weight Change - gradually lost weight Eyes Complaints and Symptoms: Positive for: Glasses / Contacts Gastrointestinal Complaints and Symptoms: Positive for: Frequent diarrhea - if she eats something with egg in it; Nausea; Vomiting Endocrine Complaints and Symptoms: Positive for: Thyroid disease Review of System Notes: thyroid removed Medical History: Positive for:  Type II Diabetes Time with diabetes: 10+ years Treated with: Diet Blood sugar tested every day: No Immunological Complaints and Symptoms: Negative for: Hives; Itching [name], [name] ([id number]) [id number] Page 9 of 10 Integumentary (Skin) Complaints and Symptoms: Positive for: Breakdown; Swelling Neurologic Complaints and Symptoms: Negative for: Numbness/parasthesias; Focal/Weakness Psychiatric Complaints and Symptoms: Negative for: Anxiety; Claustrophobia Ear/Nose/Mouth/Throat Complaints and Symptoms: Review of System Notes: chronic bronchitis Hematologic/Lymphatic Medical History: Positive for: Anemia Respiratory Medical History: Positive for: Asthma; Chronic Obstructive Pulmonary Disease (COPD); Sleep Apnea Cardiovascular Medical History: Positive for: Arrhythmia - aa fib; Congestive Heart Failure; Hypertension Genitourinary Medical History: Positive for: 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 Past Medical History Notes: 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 Musculoskeletal Medical History: Positive for: Gout Past Medical History Notes: osteoporosis Oncologic Immunizations Pneumococcal Vaccine: Received Pneumococcal Vaccination: Yes Received Pneumococcal Vaccination On or After 60th Birthday: Yes Implantable Devices None Family and Social History Never smoker; Alcohol Use: Never; Drug Use: No History; Caffeine Use: Daily - coffee Electronic Signature(s) Signed: 08/23/2023 4:35:52 PM By: Angelina Pih Signed: 08/23/2023 6:01:43 PM By: Allen Derry PA-C Entered By: Angelina Pih on 08/23/2023 05:40:39 [name], [name] Dandy  E ([id number]) [id number] Page 10 of 10 -------------------------------------------------------------------------------- SuperBill Details Patient Name: Date of Service: [name], [name] 08/23/2023 Medical Record Number: [id number] Patient Account Number: [id number] Date of Birth/Sex: Treating  RN: 31-Aug-1941 (82 y.o. 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 Paroxysmal atrial fibrillation Z79.01 Long term (current) use of anticoagulants Facility Procedures : CPT4 Code: 47425956 Description: 99213 - WOUND CARE VISIT-LEV 3 EST PT Modifier: Quantity: 1 : CPT4 Code: 38756433 Description: 11042 - DEB SUBQ TISSUE 20 SQ CM/< ICD-10 Diagnosis Description L97.812 Non-pressure chronic ulcer of other part of right lower leg with fat layer expo Modifier: sed Quantity: 1 : CPT4 Code: 29518841 Description: 11045 - DEB SUBQ TISS EA ADDL 20CM ICD-10 Diagnosis Description L97.812 Non-pressure chronic ulcer of other part of right lower leg with fat layer expo Modifier: sed Quantity: 1 Physician Procedures : CPT4 Code Description Modifier 6606301 WC PHYS LEVEL 3 NEW PT 25 ICD-10 Diagnosis Description S81.811A Laceration without foreign body, right lower leg, initial encounter E11.622 Type 2 diabetes mellitus with other skin ulcer L97.812 Non-pressure  chronic ulcer of other part of right lower leg with fat layer exposed I10 Essential (primary) hypertension Quantity: 1 : 11042 11042 - WC PHYS SUBQ TISS 20 SQ CM ICD-10 Diagnosis Description L97.812 Non-pressure chronic ulcer of other part of right lower leg with fat layer exposed Quantity: 1 : 6010932 11045 - WC PHYS SUBQ TISS EA ADDL 20 CM ICD-10 Diagnosis Description L97.812 Non-pressure  chronic ulcer of other part of right lower leg with fat layer exposed Quantity: 1 Electronic Signature(s) Signed: 08/23/2023 6:01:18 PM By: Allen Derry PA-C Entered By: Allen Derry on 08/23/2023 15:01:18

## 2023-08-27 ENCOUNTER — Encounter: Payer: Medicare Other | Admitting: Physician Assistant

## 2023-08-27 DIAGNOSIS — E11622 Type 2 diabetes mellitus with other skin ulcer: Secondary | ICD-10-CM | POA: Diagnosis not present

## 2023-08-27 NOTE — Progress Notes (Addendum)
KAROLYNE, BUECHEL ([id number]) [id number] Page 1 of 9 Visit Report for 08/27/2023 Arrival Information Details Patient Name: Date of Service: [place], [place] 08/27/2023 8:30 A M Medical Record Number: [id number] Patient Account Number: [id number] Date of Birth/Sex: Treating RN: 05/23/1941 (82 y.o. Ginette Pitman Primary Care Daun Rens: Einar Crow Other Clinician: Referring Aftan Vint: Treating Lamario Mani/Extender: Johny Shears in Treatment: 0 Visit Information History Since Last Visit Added or deleted any medications: No Patient Arrived: Cane Any new allergies or adverse reactions: No Arrival Time: 08:33 Has Dressing in Place as Prescribed: Yes Accompanied By: self Pain Present Now: No Transfer Assistance: None Patient Identification Verified: Yes Secondary Verification Process Completed: Yes Patient Has Alerts: Yes Patient Alerts: Patient on Blood Thinner Fistula LUE type 2 diabetic Electronic Signature(s) Signed: 08/27/2023 4:30:50 PM By: Midge Aver MSN RN CNS WTA Entered By: Midge Aver on 08/27/2023 05:37:59 -------------------------------------------------------------------------------- Clinic Level of Care Assessment Details Patient Name: Date of Service: P[place], [name] 08/27/2023 8:30 A M Medical Record Number: [id number] Patient Account Number: [id number] Date of Birth/Sex: Treating RN: 24-Sep-1941 (82 y.o. Ginette Pitman Primary Care Jennefer Kopp: Einar Crow Other Clinician: Referring Hugh Garrow: Treating Adaiah Morken/Extender: Johny Shears in Treatment: 0 Clinic Level of Care Assessment Items TOOL 4 Quantity Score X- 1 0 Use when only an EandM is performed on FOLLOW-UP visit ASSESSMENTS - Nursing Assessment / Reassessment X- 1 10 Reassessment of Co-morbidities (includes updates in patient status) X- 1 5 Reassessment of Adherence to Treatment Plan ASSESSMENTS - Wound and Skin A  ssessment / Reassessment X - Simple Wound Assessment / Reassessment - one wound 1 5 Kienitz, Zakyria E ([id number]) [id number] Page 2 of 9 []  - 0 Complex Wound Assessment / Reassessment - multiple wounds []  - 0 Dermatologic / Skin Assessment (not related to wound area) ASSESSMENTS - Focused Assessment []  - 0 Circumferential Edema Measurements - multi extremities []  - 0 Nutritional Assessment / Counseling / Intervention []  - 0 Lower Extremity Assessment (monofilament, tuning fork, pulses) []  - 0 Peripheral Arterial Disease Assessment (using hand held doppler) ASSESSMENTS - Ostomy and/or Continence Assessment and Care []  - 0 Incontinence Assessment and Management []  - 0 Ostomy Care Assessment and Management (repouching, etc.) PROCESS - Coordination of Care X - Simple Patient / Family Education for ongoing care 1 15 []  - 0 Complex (extensive) Patient / Family Education for ongoing care X- 1 10 Staff obtains Chiropractor, Records, T Results / Process Orders est []  - 0 Staff telephones HHA, Nursing Homes / Clarify orders / etc []  - 0 Routine Transfer to another Facility (non-emergent condition) []  - 0 Routine Hospital Admission (non-emergent condition) []  - 0 New Admissions / Manufacturing engineer / Ordering NPWT Apligraf, etc. , []  - 0 Emergency Hospital Admission (emergent condition) X- 1 10 Simple Discharge Coordination []  - 0 Complex (extensive) Discharge Coordination PROCESS - Special Needs []  - 0 Pediatric / Minor Patient Management []  - 0 Isolation Patient Management []  - 0 Hearing / Language / Visual special needs []  - 0 Assessment of Community assistance (transportation, D/C planning, etc.) []  - 0 Additional assistance / Altered mentation []  - 0 Support Surface(s) Assessment (bed, cushion, seat, etc.) INTERVENTIONS - Wound Cleansing / Measurement X - Simple Wound Cleansing - one wound 1 5 []  - 0 Complex Wound Cleansing - multiple  wounds X- 1 5 Wound Imaging (photographs - any number of wounds) []  - 0 Wound Tracing (instead of photographs) X- 1 5 Simple Wound Measurement - one  wound []  - 0 Complex Wound Measurement - multiple wounds INTERVENTIONS - Wound Dressings []  - 0 Small Wound Dressing one or multiple wounds X- 1 15 Medium Wound Dressing one or multiple wounds []  - 0 Large Wound Dressing one or multiple wounds []  - 0 Application of Medications - topical []  - 0 Application of Medications - injection INTERVENTIONS - Miscellaneous []  - 0 External ear exam []  - 0 Specimen Collection (cultures, biopsies, blood, body fluids, etc.) []  - 0 Specimen(s) / Culture(s) sent or taken to Lab for analysis CEDRIANA, GOATES E ([id number]) [id number] Page 3 of 9 []  - 0 Patient Transfer (multiple staff / Nurse, adult / Similar devices) []  - 0 Simple Staple / Suture removal (25 or less) []  - 0 Complex Staple / Suture removal (26 or more) []  - 0 Hypo / Hyperglycemic Management (close monitor of Blood Glucose) []  - 0 Ankle / Brachial Index (ABI) - do not check if billed separately X- 1 5 Vital Signs Has the patient been seen at the hospital within the last three years: Yes Total Score: 90 Level Of Care: New/Established - Level 3 Electronic Signature(s) Signed: 08/27/2023 4:30:50 PM By: Midge Aver MSN RN CNS WTA Entered By: Midge Aver on 08/27/2023 06:23:48 -------------------------------------------------------------------------------- Encounter Discharge Information Details Patient Name: Date of Service: [name]. 08/27/2023 8:30 A M Medical Record Number: [id number] Patient Account Number: [id number] Date of Birth/Sex: Treating RN: 15-Apr-1941 (82 y.o. Ginette Pitman Primary Care Camp Gopal: Einar Crow Other Clinician: Referring Breshae Belcher: Treating Tyshawna Alarid/Extender: Johny Shears in Treatment: 0 Encounter Discharge Information Items Discharge  Condition: Stable Ambulatory Status: Ambulatory Discharge Destination: Home Transportation: Private Auto Accompanied By: self Schedule Follow-up Appointment: Yes Clinical Summary of Care: Electronic Signature(s) Signed: 08/27/2023 4:30:50 PM By: Midge Aver MSN RN CNS WTA Entered By: Midge Aver on 08/27/2023 06:25:00 -------------------------------------------------------------------------------- Lower Extremity Assessment Details Patient Name: Date of Service: Jacobowitz, [name] E. 08/27/2023 8:30 A M Medical Record Number: [id number] Patient Account Number: [id number] Date of Birth/Sex: Treating RN: 08-Apr-1941 (82 y.o. Ginette Pitman Primary Care Aljean Horiuchi: Einar Crow Other Clinician: NINAMARIE, KEENON ([id number]) [id number] Page 4 of 9 Referring Chisom Muntean: Treating Caden Fatica/Extender: Johny Shears in Treatment: 0 Edema Assessment Assessed: [Left: No] [Right: Yes] [Left: Edema] [Right: :] Calf Left: Right: Point of Measurement: 33 cm From Medial Instep 31 cm Ankle Left: Right: Point of Measurement: 11 cm From Medial Instep 20.35 cm Vascular Assessment Pulses: Dorsalis Pedis Palpable: [Right:Yes] Extremity colors, hair growth, and conditions: Extremity Color: [Right:Normal] Hair Growth on Extremity: [Right:No] Temperature of Extremity: [Right:Warm] Capillary Refill: [Right:< 3 seconds] Dependent Rubor: [Right:No] Blanched when Elevated: [Right:No No] Toe Nail Assessment Left: Right: Thick: No Discolored: No Deformed: No Improper Length and Hygiene: No Electronic Signature(s) Signed: 08/27/2023 4:30:50 PM By: Midge Aver MSN RN CNS WTA Entered By: Midge Aver on 08/27/2023 05:46:00 -------------------------------------------------------------------------------- Multi Wound Chart Details Patient Name: Date of Service: [name]. 08/27/2023 8:30 A M Medical Record Number: [id number] Patient Account Number:  [id number] Date of Birth/Sex: Treating RN: 20-Nov-1940 (82 y.o. Ginette Pitman Primary Care Arisbel Maione: Einar Crow Other Clinician: Referring Jordany Russett: Treating Ashima Shrake/Extender: Johny Shears in Treatment: 0 Vital Signs Height(in): 62 Pulse(bpm): 61 Weight(lbs): 160 Blood Pressure(mmHg): 149/59 Body Mass Index(BMI): 29.3 Temperature(F): 97.9 Respiratory Rate(breaths/min): 18 Vanduzer, Morning E ([id number]) [1:Photos:] [N/A:N/A] Right, Lateral Lower Leg N/A N/A Wound Location: Skin Tear/Laceration N/A N/A Wounding Event: Skin Tear N/A N/A Primary Etiology: Anemia, Asthma, Chronic Obstructive  N/A N/A Comorbid History: Pulmonary Disease (COPD), Sleep Apnea, Arrhythmia, Congestive Heart Failure, Hypertension, Type II Diabetes, End Stage Renal Disease, Gout 08/21/2023 N/A N/A Date Acquired: 0 N/A N/A Weeks of Treatment: Open N/A N/A Wound Status: No N/A N/A Wound Recurrence: 5.5x5x0.5 N/A N/A Measurements L x W x D (cm) 21.598 N/A N/A A (cm) : rea 10.799 N/A N/A Volume (cm) : 27.30% N/A N/A % Reduction in Area: 27.30% N/A N/A % Reduction in Volume: Full Thickness Without Exposed N/A N/A Classification: Support Structures Medium N/A N/A Exudate A mount: Serosanguineous N/A N/A Exudate Type: red, brown N/A N/A Exudate Color: Small (1-33%) N/A N/A Granulation A mount: Red N/A N/A Granulation Quality: Large (67-100%) N/A N/A Necrotic A mount: Eschar, Adherent Slough N/A N/A Necrotic Tissue: Fat Layer (Subcutaneous Tissue): Yes N/A N/A Exposed Structures: None N/A N/A Epithelialization: Treatment Notes Electronic Signature(s) Signed: 08/27/2023 4:30:50 PM By: Midge Aver MSN RN CNS WTA Entered By: Midge Aver on 08/27/2023 06:11:50 -------------------------------------------------------------------------------- Multi-Disciplinary Care Plan Details Patient Name: Date of Service: [name]. 08/27/2023 8:30 A M Medical  Record Number: [id number] Patient Account Number: [id number] Date of Birth/Sex: Treating RN: 1941-06-16 (82 y.o. Ginette Pitman Primary Care Latisa Belay: Einar Crow Other Clinician: Referring Kelsay Haggard: Treating Ilene Witcher/Extender: Johny Shears in Treatment: 0 Active Inactive Necrotic Tissue Nursing Diagnoses: Impaired tissue integrity related to necrotic/devitalized tissue Knowledge deficit related to management of necrotic/devitalized tissue MYLISHA, SANANTONIO ([id number]) [phone].pdf Page 6 of 9 Goals: Necrotic/devitalized tissue will be minimized in the wound bed Date Initiated: 08/23/2023 Target Resolution Date: 10/18/2023 Goal Status: Active Patient/caregiver will verbalize understanding of reason and process for debridement of necrotic tissue Date Initiated: 08/23/2023 Date Inactivated: 08/23/2023 Target Resolution Date: 08/23/2023 Goal Status: Met Interventions: Assess patient pain level pre-, during and post procedure and prior to discharge Provide education on necrotic tissue and debridement process Treatment Activities: Apply topical anesthetic as ordered : 08/23/2023 Excisional debridement : 08/23/2023 Notes: Wound/Skin Impairment Nursing Diagnoses: Impaired tissue integrity Knowledge deficit related to ulceration/compromised skin integrity Goals: Ulcer/skin breakdown will have a volume reduction of 30% by week 4 Date Initiated: 08/23/2023 Target Resolution Date: 09/20/2023 Goal Status: Active Ulcer/skin breakdown will have a volume reduction of 50% by week 8 Date Initiated: 08/23/2023 Target Resolution Date: 10/18/2023 Goal Status: Active Ulcer/skin breakdown will have a volume reduction of 80% by week 12 Date Initiated: 08/23/2023 Target Resolution Date: 11/15/2023 Goal Status: Active Ulcer/skin breakdown will heal within 14 weeks Date Initiated: 08/23/2023 Target Resolution Date: 11/29/2023 Goal Status:  Active Interventions: Assess patient/caregiver ability to obtain necessary supplies Assess patient/caregiver ability to perform ulcer/skin care regimen upon admission and as needed Assess ulceration(s) every visit Provide education on ulcer and skin care Notes: Electronic Signature(s) Signed: 08/27/2023 4:30:50 PM By: Midge Aver MSN RN CNS WTA Entered By: Midge Aver on 08/27/2023 06:24:18 -------------------------------------------------------------------------------- Pain Assessment Details Patient Name: Date of Service: [name]. 08/27/2023 8:30 A M Medical Record Number: [id number] Patient Account Number: [id number] Date of Birth/Sex: Treating RN: 1941-01-02 (82 y.o. Ginette Pitman Primary Care Adonus Uselman: Einar Crow Other Clinician: Referring Joeanne Robicheaux: Treating Macall Mccroskey/Extender: Johny Shears in Treatment: 0 Active Problems [place], Alben Spittle ([id number]) [id number] Page 7 of 9 Location of Pain Severity and Description of Pain Patient Has Paino No Site Locations Pain Management and Medication Current Pain Management: Electronic Signature(s) Signed: 08/27/2023 4:30:50 PM By: Midge Aver MSN RN CNS WTA Entered By: Midge Aver on 08/27/2023 05:44:06 -------------------------------------------------------------------------------- Patient/Caregiver Education Details Patient  Name: Date of Service: Camily, Freid [place] 11/25/2024andnbsp8:30 A M Medical Record Number: [id number] Patient Account Number: [id number] Date of Birth/Gender: Treating RN: 16-Jul-1941 (82 y.o. Ginette Pitman Primary Care Physician: Einar Crow Other Clinician: Referring Physician: Treating Physician/Extender: Johny Shears in Treatment: 0 Education Assessment Education Provided To: Patient Education Topics Provided Wound/Skin Impairment: Handouts: Caring for Your Ulcer Methods: Explain/Verbal Responses: State  content correctly Electronic Signature(s) Signed: 08/27/2023 4:30:50 PM By: Midge Aver MSN RN CNS WTA Entered By: Midge Aver on 08/27/2023 40:98:11 Tritch, Alben Spittle ([id number]) [id number] Page 8 of 9 -------------------------------------------------------------------------------- Wound Assessment Details Patient Name: Date of Service: AMILAH, KOPELMAN 08/27/2023 8:30 A M Medical Record Number: [id number] Patient Account Number: [id number] Date of Birth/Sex: Treating RN: 1940-10-13 (82 y.o. Ginette Pitman Primary Care Bryse Blanchette: Einar Crow Other Clinician: Referring Myca Perno: Treating Slevin Gunby/Extender: Johny Shears in Treatment: 0 Wound Status Wound Number: 1 Primary Skin T ear Etiology: Wound Location: Right, Lateral Lower Leg Wound Open Wounding Event: Skin Tear/Laceration Status: Date Acquired: 08/21/2023 Comorbid Anemia, Asthma, Chronic Obstructive Pulmonary Disease (COPD), Weeks Of Treatment: 0 History: Sleep Apnea, Arrhythmia, Congestive Heart Failure, Hypertension, Clustered Wound: No Type II Diabetes, End Stage Renal Disease, Gout Photos Wound Measurements Length: (cm) 5.5 Width: (cm) 5 Depth: (cm) 0.5 Area: (cm) 21.598 Volume: (cm) 10.799 % Reduction in Area: 27.3% % Reduction in Volume: 27.3% Epithelialization: None Wound Description Classification: Full Thickness Without Exposed Support Structures Exudate Amount: Medium Exudate Type: Serosanguineous Exudate Color: red, brown Foul Odor After Cleansing: No Slough/Fibrino Yes Wound Bed Granulation Amount: Small (1-33%) Exposed Structure Granulation Quality: Red Fat Layer (Subcutaneous Tissue) Exposed: Yes Necrotic Amount: Large (67-100%) Necrotic Quality: Eschar, Adherent Slough Treatment Notes Wound #1 (Lower Leg) Wound Laterality: Right, Lateral Cleanser [place] Ancillary Kit - 15 Day Supply Discharge Instruction: Use supplies as instructed; Kit  contains: (15) Saline Bullets; (15) 3x3 Gauze; 15 pr Gloves Wound Cleanser Discharge Instruction: Wash your hands with soap and water. Remove old dressing, discard into plastic bag and place into trash. Cleanse the wound with Wound Cleanser prior to applying a clean dressing using gauze sponges, not tissues or cotton balls. Do not scrub or use excessive force. Pat dry using gauze sponges, not tissue or cotton balls. NAWAL, RIGOLI ([id number]) [id number] Page 9 of 9 Peri-Wound Care Topical Primary Dressing Xeroform 5x9-HBD (in/in) Discharge Instruction: cut small strip to pack into area of depth and undermining Apply Xeroform 5x9-HBD (in/in) as directed Secondary Dressing ABD Pad 5x9 (in/in) Discharge Instruction: Cover with ABD pad Secured With Medipore T - 13M Medipore H Soft Cloth Surgical T ape ape, 2x2 (in/yd) Kerlix Roll Sterile or Non-Sterile 6-ply 4.5x4 (yd/yd) Discharge Instruction: Apply Kerlix as directed Compression Wrap Compression Stockings Add-Ons Electronic Signature(s) Signed: 08/27/2023 4:30:50 PM By: Midge Aver MSN RN CNS WTA Entered By: Midge Aver on 08/27/2023 05:45:17 -------------------------------------------------------------------------------- Vitals Details Patient Name: Date of Service: Galen [place], [name] E. 08/27/2023 8:30 A M Medical Record Number: [id number] Patient Account Number: [id number] Date of Birth/Sex: Treating RN: 1940-12-01 (82 y.o. Ginette Pitman Primary Care Ahmod Gillespie: Einar Crow Other Clinician: Referring Ary Rudnick: Treating Elois Averitt/Extender: Johny Shears in Treatment: 0 Vital Signs Time Taken: 08:40 Temperature (F): 97.9 Height (in): 62 Pulse (bpm): 61 Weight (lbs): 160 Respiratory Rate (breaths/min): 18 Body Mass Index (BMI): 29.3 Blood Pressure (mmHg): 149/59 Reference Range: 80 - 120 mg / dl Electronic Signature(s) Signed: 08/27/2023 4:30:50 PM By: Midge Aver MSN RN  CNS WTA Entered  By: Midge Aver on 08/27/2023 05:41:05

## 2023-08-27 NOTE — Progress Notes (Addendum)
[name], [name] ([id number]) [phone].pdf Page 1 of 6 Visit Report for 08/27/2023 Chief Complaint Document Details Patient Name: Date of Service: [name], [name] [place] 08/27/2023 8:30 A M Medical Record Number: [id number] Patient Account Number: [id number] Date of Birth/Sex: Treating RN: October 05, 1940 (82 y.o. Ginette Pitman Primary Care Provider: Einar Crow Other Clinician: Referring Provider: Treating Provider/Extender: Johny Shears in Treatment: 0 Information Obtained from: Patient Chief Complaint Right LE Ulcer Electronic Signature(s) Signed: 08/27/2023 8:31:56 AM By: Allen Derry PA-C Entered By: Allen Derry on 08/27/2023 05:31:55 -------------------------------------------------------------------------------- HPI Details Patient Name: Date of Service: [name], [name] 08/27/2023 8:30 A M Medical Record Number: [id number] Patient Account Number: [id number] Date of Birth/Sex: Treating RN: 19-Feb-1941 (82 y.o. Ginette Pitman Primary Care Provider: Einar Crow Other Clinician: Referring Provider: Treating Provider/Extender: Johny Shears in Treatment: 0 History of Present Illness HPI Description: 08-23-2023 upon evaluation today patient presents for initial inspection here in our clinic due to an issue that she has been having with a skin tear of the right lateral lower extremity where she tells me that a storm door was going back into her leg causing a significant skin tear. With her leg hurting she unfortunately noted around July 23, 2023 that she was having a regular heart rate where it was jumping up going back down and it was so erratic and irregular she ended up calling an ambulance to take her to the hospital this ended up with a hospital stent that went from the July 23, 2023 through August 06, 2023. During the course of that hospital stay on 08-02-2023 she had an OR debridement it sounds  like she may have had a significant hematoma based on what I am seeing and hearing. Following that time she has been using some Xeroform here and there and that has helped to some degree right now she tells me she has not been using that as she did not have any more of that at this point. She did have an x-ray on 07-30-2023 which was negative for any signs of fracture or acute bony abnormality. Patient does have a history of diabetes mellitus type 2, hypertension, COPD, chronic kidney disease stage IV, atrial fibrillation, long-term use of anticoagulant therapy. 08-27-2023 upon evaluation today patient appears to be doing well currently in regard to her wound. This is actually showing signs of good improvement she had a lot of bleeding last week after debridement this was necessary but nonetheless she tells me that it really did bleed a lot. She was able to finally get it stop she did get her supplies she was very appreciative of this as well. Fortunately I do not see any signs of active infection locally or systemically at this time. ALIAHNA, WEINGARTEN ([id number]) [phone].pdf Page 2 of 6 Electronic Signature(s) Signed: 08/27/2023 9:20:15 AM By: Allen Derry PA-C Entered By: Allen Derry on 08/27/2023 06:20:14 -------------------------------------------------------------------------------- Physical Exam Details Patient Name: Date of Service: [name], [name] 08/27/2023 8:30 A M Medical Record Number: [id number] Patient Account Number: [id number] Date of Birth/Sex: Treating RN: 1941/01/28 (82 y.o. Ginette Pitman Primary Care Provider: Einar Crow Other Clinician: Referring Provider: Treating Provider/Extender: Johny Shears in Treatment: 0 Constitutional Well-nourished and well-hydrated in no acute distress. Respiratory normal breathing without difficulty. Psychiatric this patient is able to make decisions and demonstrates good insight  into disease process. Alert and Oriented x 3. pleasant and cooperative. Notes Upon inspection patient's wound bed actually showed signs  of good granulation and epithelization at this point. Fortunately I do not see any evidence of worsening overall and I believe that the patient is making good headway here towards closure which is great news. Electronic Signature(s) Signed: 08/27/2023 9:20:25 AM By: Allen Derry PA-C Entered By: Allen Derry on 08/27/2023 06:20:25 -------------------------------------------------------------------------------- Physician Orders Details Patient Name: Date of Service: [name] E. 08/27/2023 8:30 A M Medical Record Number: [id number] Patient Account Number: [id number] Date of Birth/Sex: Treating RN: 11-14-1940 (82 y.o. Ginette Pitman Primary Care Provider: Einar Crow Other Clinician: Referring Provider: Treating Provider/Extender: Johny Shears in Treatment: 0 The following information was scribed by: Midge Aver The information was scribed for: Allen Derry Verbal / Phone Orders: No Diagnosis Coding ICD-10 Coding Code Description LAURE, [name] ([id number]) [phone].pdf Page 3 of 6 [phone] Laceration without foreign body, right lower leg, initial encounter E11.622 Type 2 diabetes mellitus with other skin ulcer L97.812 Non-pressure chronic ulcer of other part of right lower leg with fat layer exposed I10 Essential (primary) hypertension J44.89 Other specified chronic obstructive pulmonary disease N18.4 Chronic kidney disease, stage 4 (severe) I48.0 Paroxysmal atrial fibrillation Z79.01 Long term (current) use of anticoagulants Follow-up Appointments Return Appointment in 1 week. Bathing/ Shower/ Hygiene May shower; gently cleanse wound with antibacterial soap, rinse and pat dry prior to dressing wounds - Use Dial original GOLD soap to clean wound after showering No tub bath. Anesthetic  (Use 'Patient Medications' Section for Anesthetic Order Entry) Lidocaine applied to wound bed Edema Control - Orders / Instructions Elevate, Exercise Daily and A void Standing for Long Periods of Time. Elevate legs to the level of the heart and pump ankles as often as possible Elevate leg(s) parallel to the floor when sitting. DO YOUR BEST to sleep in the bed at night. DO NOT sleep in your recliner. Long hours of sitting in a recliner leads to swelling of the legs and/or potential wounds on your backside. Wound Treatment Wound #1 - Lower Leg Wound Laterality: Right, Lateral Cleanser: Byram Ancillary Kit - 15 Day Supply (Generic) 1 x Per Day/15 Days Discharge Instructions: Use supplies as instructed; Kit contains: (15) Saline Bullets; (15) 3x3 Gauze; 15 pr Gloves Cleanser: Wound Cleanser 1 x Per Day/15 Days Discharge Instructions: Wash your hands with soap and water. Remove old dressing, discard into plastic bag and place into trash. Cleanse the wound with Wound Cleanser prior to applying a clean dressing using gauze sponges, not tissues or cotton balls. Do not scrub or use excessive force. Pat dry using gauze sponges, not tissue or cotton balls. Prim Dressing: Xeroform 5x9-HBD (in/in) 1 x Per Day/15 Days ary Discharge Instructions: cut small strip to pack into area of depth and undermining Apply Xeroform 5x9-HBD (in/in) as directed Secondary Dressing: ABD Pad 5x9 (in/in) (Generic) 1 x Per Day/15 Days Discharge Instructions: Cover with ABD pad Secured With: Medipore T - 33M Medipore H Soft Cloth Surgical T ape ape, 2x2 (in/yd) (Generic) 1 x Per Day/15 Days Secured With: Kerlix Roll Sterile or Non-Sterile 6-ply 4.5x4 (yd/yd) (Generic) 1 x Per Day/15 Days Discharge Instructions: Apply Kerlix as directed Electronic Signature(s) Signed: 08/27/2023 3:42:23 PM By: Allen Derry PA-C Signed: 08/27/2023 4:30:50 PM By: Midge Aver MSN RN CNS WTA Entered By: Midge Aver on 08/27/2023  06:22:55 -------------------------------------------------------------------------------- Problem List Details Patient Name: Date of Service: [name]. 08/27/2023 8:30 A M Medical Record Number: [id number] Patient Account Number: [id number] Date of Birth/Sex: Treating RN: [birth year] (81 y.o. Ginette Pitman  Primary Care Provider: Einar Crow Other Clinician: Referring Provider: Treating Provider/Extender: Alvino Chapel McMullen, [place] E ([id number]) [id number] Page 4 of 6 Weeks in Treatment: 0 Active Problems ICD-10 Encounter Code Description Active Date MDM Diagnosis S81.811A Laceration without foreign body, right lower leg, initial encounter 08/23/2023 No Yes E11.622 Type 2 diabetes mellitus with other skin ulcer 08/23/2023 No Yes L97.812 Non-pressure chronic ulcer of other part of right lower leg with fat layer 08/23/2023 No Yes exposed I10 Essential (primary) hypertension 08/23/2023 No Yes J44.89 Other specified chronic obstructive pulmonary disease 08/23/2023 No Yes N18.4 Chronic kidney disease, stage 4 (severe) 08/23/2023 No Yes I48.0 Paroxysmal atrial fibrillation 08/23/2023 No Yes Z79.01 Long term (current) use of anticoagulants 08/23/2023 No Yes Inactive Problems Resolved Problems Electronic Signature(s) Signed: 08/27/2023 8:31:53 AM By: Allen Derry PA-C Entered By: Allen Derry on 08/27/2023 05:31:53 -------------------------------------------------------------------------------- Progress Note Details Patient Name: Date of Service: Sweis, Jaquita [name] E. 08/27/2023 8:30 A M Medical Record Number: [id number] Patient Account Number: [id number] Date of Birth/Sex: Treating RN: 1940-12-09 (82 y.o. Ginette Pitman Primary Care Provider: Einar Crow Other Clinician: Referring Provider: Treating Provider/Extender: Johny Shears in Treatment: 0 Subjective Orzechowski, Alben Spittle ([id number])  [id number] Page 5 of 6 Chief Complaint Information obtained from Patient Right LE Ulcer History of Present Illness (HPI) 08-23-2023 upon evaluation today patient presents for initial inspection here in our clinic due to an issue that she has been having with a skin tear of the right lateral lower extremity where she tells me that a storm door was going back into her leg causing a significant skin tear. With her leg hurting she unfortunately noted around July 23, 2023 that she was having a regular heart rate where it was jumping up going back down and it was so erratic and irregular she ended up calling an ambulance to take her to the hospital this ended up with a hospital stent that went from the July 23, 2023 through August 06, 2023. During the course of that hospital stay on 08-02-2023 she had an OR debridement it sounds like she may have had a significant hematoma based on what I am seeing and hearing. Following that time she has been using some Xeroform here and there and that has helped to some degree right now she tells me she has not been using that as she did not have any more of that at this point. She did have an x-ray on 07-30-2023 which was negative for any signs of fracture or acute bony abnormality. Patient does have a history of diabetes mellitus type 2, hypertension, COPD, chronic kidney disease stage IV, atrial fibrillation, long-term use of anticoagulant therapy. 08-27-2023 upon evaluation today patient appears to be doing well currently in regard to her wound. This is actually showing signs of good improvement she had a lot of bleeding last week after debridement this was necessary but nonetheless she tells me that it really did bleed a lot. She was able to finally get it stop she did get her supplies she was very appreciative of this as well. Fortunately I do not see any signs of active infection locally or systemically at  this time. Objective Constitutional Well-nourished and well-hydrated in no acute distress. Vitals Time Taken: 8:40 AM, Height: 62 in, Weight: 160 lbs, BMI: 29.3, Temperature: 97.9 F, Pulse: 61 bpm, Respiratory Rate: 18 breaths/min, Blood Pressure: 149/59 mmHg. Respiratory normal breathing without difficulty. Psychiatric this patient is able to make decisions and demonstrates good insight into disease  process. Alert and Oriented x 3. pleasant and cooperative. General Notes: Upon inspection patient's wound bed actually showed signs of good granulation and epithelization at this point. Fortunately I do not see any evidence of worsening overall and I believe that the patient is making good headway here towards closure which is great news. Integumentary (Hair, Skin) Wound #1 status is Open. Original cause of wound was Skin T ear/Laceration. The date acquired was: 08/21/2023. The wound is located on the Right,Lateral Lower Leg. The wound measures 5.5cm length x 5cm width x 0.5cm depth; 21.598cm^2 area and 10.799cm^3 volume. There is Fat Layer (Subcutaneous Tissue) exposed. There is a medium amount of serosanguineous drainage noted. There is small (1-33%) red granulation within the wound bed. There is a large (67-100%) amount of necrotic tissue within the wound bed including Eschar and Adherent Slough. Assessment Active Problems ICD-10 Laceration without foreign body, right lower leg, initial encounter Type 2 diabetes mellitus with other skin ulcer Non-pressure chronic ulcer of other part of right lower leg with fat layer exposed Essential (primary) hypertension Other specified chronic obstructive pulmonary disease Chronic kidney disease, stage 4 (severe) Paroxysmal atrial fibrillation Long term (current) use of anticoagulants Plan 1. I would recommend that we have the patient going continue to monitor for any signs of infection or worsening. Based on what I see I do feel like there  were making really good headway here towards closure. 2. I am going to recommend as well the patient should continue to utilize the Xeroform gauze dressing which I think is still the best way to go over seeing a lot of new granulation tissue at the same time there is still some necrotic tissue but I avoided debridement today due to the issues with bleeding that occurred last week we want to try to see if the medicine will loosen this up a little bit more already it looks significantly improved. We will see patient back for reevaluation in 2 weeks here in the clinic. If anything worsens or changes patient will contact our office for additional JEANINNE, BARBANO ([id number]) [phone].pdf Page 6 of 6 recommendations. Electronic Signature(s) Signed: 08/27/2023 9:21:07 AM By: Allen Derry PA-C Entered By: Allen Derry on 08/27/2023 06:21:06 -------------------------------------------------------------------------------- SuperBill Details Patient Name: Date of Service: [place], [name] 08/27/2023 Medical Record Number: [id number] Patient Account Number: [id number] Date of Birth/Sex: Treating RN: 21-May-1941 (82 y.o. Ginette Pitman Primary Care Provider: Einar Crow Other Clinician: Referring Provider: Treating Provider/Extender: Johny Shears in Treatment: 0 Diagnosis Coding ICD-10 Codes Code Description [phone] Laceration without foreign body, right lower leg, initial encounter E11.622 Type 2 diabetes mellitus with other skin ulcer L97.812 Non-pressure chronic ulcer of other part of right lower leg with fat layer exposed I10 Essential (primary) hypertension J44.89 Other specified chronic obstructive pulmonary disease N18.4 Chronic kidney disease, stage 4 (severe) I48.0 Paroxysmal atrial fibrillation Z79.01 Long term (current) use of anticoagulants Facility Procedures : CPT4 Code: [id number] Description: 99213 - WOUND CARE VISIT-LEV 3 EST  PT Modifier: Quantity: 1 Physician Procedures : CPT4 Code Description Modifier [id number] 99213 - WC PHYS LEVEL 3 - EST PT ICD-10 Diagnosis Description S81.811A Laceration without foreign body, right lower leg, initial encounter E11.622 Type 2 diabetes mellitus with other skin ulcer L97.812  Non-pressure chronic ulcer of other part of right lower leg with fat layer exposed I10 Essential (primary) hypertension Quantity: 1 Electronic Signature(s) Signed: 08/27/2023 3:42:23 PM By: Allen Derry PA-C Signed: 08/27/2023 4:30:50 PM By: Midge Aver MSN RN CNS WTA  Previous Signature: 08/27/2023 9:23:12 AM Version By: Allen Derry PA-C Entered By: Midge Aver on 08/27/2023 06:24:08

## 2023-08-28 ENCOUNTER — Telehealth: Payer: Self-pay

## 2023-08-28 ENCOUNTER — Inpatient Hospital Stay: Payer: Medicare Other

## 2023-08-28 VITALS — BP 154/46 | HR 52

## 2023-08-28 DIAGNOSIS — G4733 Obstructive sleep apnea (adult) (pediatric): Secondary | ICD-10-CM

## 2023-08-28 DIAGNOSIS — D631 Anemia in chronic kidney disease: Secondary | ICD-10-CM

## 2023-08-28 DIAGNOSIS — I129 Hypertensive chronic kidney disease with stage 1 through stage 4 chronic kidney disease, or unspecified chronic kidney disease: Secondary | ICD-10-CM | POA: Diagnosis not present

## 2023-08-28 LAB — HEMOGLOBIN AND HEMATOCRIT (CANCER CENTER ONLY)
HCT: 26.6 % — ABNORMAL LOW (ref 36.0–46.0)
Hemoglobin: 8.5 g/dL — ABNORMAL LOW (ref 12.0–15.0)

## 2023-08-28 MED ORDER — DARBEPOETIN ALFA 200 MCG/0.4ML IJ SOSY
200.0000 ug | PREFILLED_SYRINGE | Freq: Once | INTRAMUSCULAR | Status: AC
  Administered 2023-08-28: 200 ug via SUBCUTANEOUS
  Filled 2023-08-28: qty 0.4

## 2023-08-28 NOTE — Telephone Encounter (Signed)
-----   Message from Erin Fulling sent at 08/28/2023  1:55 PM EST ----- Regarding: RE: Didi we have old sleep study for this patient Lets plan for split night.. ----- Message ----- From: Rosaland Lao, CMA Sent: 08/27/2023   3:46 PM EST To: Erin Fulling, MD Subject: FW: Didi we have old sleep study for this pa#   ----- Message ----- From: Lilian Kapur Sent: 08/27/2023   3:41 PM EST To: Rosaland Lao, CMA; Bonney Leitz, CMA Subject: Didi we have old sleep study for this patient  Dr. Belia Heman order Cpap Titration Study we will need old sleep study or order Split night  Thanks Synetta Fail

## 2023-08-28 NOTE — Telephone Encounter (Signed)
ATC patient to make her aware of need for split night. No answer with no option to leave vm received recording to enter remote access code.  Will call back.

## 2023-08-29 NOTE — Telephone Encounter (Signed)
ATC patient to make her aware of need for split night. No answer with no option to leave vm received recording to enter remote access code.  Will try one more time.

## 2023-09-03 ENCOUNTER — Ambulatory Visit: Payer: Self-pay

## 2023-09-04 ENCOUNTER — Ambulatory Visit: Payer: Medicare Other | Admitting: Cardiology

## 2023-09-04 NOTE — Telephone Encounter (Signed)
I have notified the patient and placed the order for the split night sleep study.  Nothing further needed.

## 2023-09-07 ENCOUNTER — Ambulatory Visit: Payer: Medicare Other

## 2023-09-07 ENCOUNTER — Ambulatory Visit
Admission: RE | Admit: 2023-09-07 | Discharge: 2023-09-07 | Disposition: A | Discharge status: Home / Self Care | Payer: Medicare Other | Source: Ambulatory Visit | Attending: Physician Assistant | Admitting: Physician Assistant

## 2023-09-07 VITALS — BP 151/64 | HR 51 | Temp 98.1°F | Resp 17

## 2023-09-07 DIAGNOSIS — R051 Acute cough: Secondary | ICD-10-CM | POA: Diagnosis not present

## 2023-09-07 DIAGNOSIS — J209 Acute bronchitis, unspecified: Secondary | ICD-10-CM | POA: Diagnosis not present

## 2023-09-07 MED ORDER — AZITHROMYCIN 250 MG PO TABS: ORAL_TABLET | ORAL | 0 refills | Status: AC

## 2023-09-07 NOTE — Discharge Instructions (Signed)
Use your inhaler 2 puffs every 4 hours

## 2023-09-07 NOTE — ED Triage Notes (Signed)
Cough, SOB with exertion x 3 weeks. Taking robitussin and mucinex with no relief of symptoms.

## 2023-09-07 NOTE — ED Provider Notes (Signed)
[name]    CSN: [id number] Arrival date & time: 09/07/23  1152      History   Chief Complaint Chief Complaint  Patient presents with   Cough    Entered by patient    HPI [name] is a 82 y.o. female.   Patient complains of a cough and congestion.  Patient reports that the cough began 3 weeks ago and has continued.  Patient reports that she has a history of lung infections.  Patient reports that she has COPD.  Patient has not been exposed to flu or COVID.  Patient reports she has not had a fever today.  She denies current shortness of breath.  She is not having any pain in her chest.  Patient states she does use an albuterol inhaler.  The history is provided by the patient. No language interpreter was used.  Cough   Past Medical History:  Diagnosis Date   Actinic keratosis    Albuminuria    Anemia    Arthritis    Asthma    Basal cell carcinoma 04/12/2017   Above right lateral brow. Nodulocystic type. EDC   Cancer (HCC)    skin   Cataract cortical, senile    CHF (congestive heart failure) (HCC)    Diabetes mellitus without complication (HCC)    GERD (gastroesophageal reflux disease)    Hemorrhoids    History of kidney stones    Hyperlipidemia    Hypertension    Hypothyroidism    Lyme disease    No kidney function    OSA (obstructive sleep apnea)    Osteoporosis    Osteoporosis    Reflux esophagitis    Steatohepatitis    Steatohepatitis     Patient Active Problem List   Diagnosis Date Noted   Atrial fibrillation with rapid ventricular response (HCC) 07/30/2023   Traumatic hematoma of lower leg with infection, right, subsequent encounter 07/30/2023   Hypokalemia 07/30/2023   Elevated troponin 07/30/2023   PAF (paroxysmal atrial fibrillation) (HCC) 06/03/2023   Hypercalcemia 04/16/2023   Hypermagnesemia 04/16/2023   HLD (hyperlipidemia) 04/16/2023   COPD (chronic obstructive pulmonary disease) (HCC) 04/16/2023   Anemia in chronic kidney  disease 03/27/2023   Electrolyte abnormality 03/24/2023   CKD (chronic kidney disease) stage 5, 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   Hyperparathyroidism, unspecified (HCC) 08/05/2022   Metabolic acidosis 08/02/2022   Uremia of renal origin 08/02/2022   Uremia 08/02/2022   Acute metabolic encephalopathy 08/02/2022   Acute on chronic diastolic congestive heart failure (HCC) 08/02/2022   Chronic bronchitis (HCC) 08/02/2022   Anemia of chronic kidney failure, stage 5 (HCC) 08/02/2022   Dizziness s/p fall 08/02/2022   AKI (acute kidney injury) (HCC) 06/12/2022   Hypomagnesemia 06/12/2022   Hyperphosphatemia 06/12/2022   Pneumonia due to COVID-19 virus 06/10/2022   Hypocalcemia, symptomatic 06/10/2022   Dyslipidemia 06/10/2022   Depression 06/10/2022   Right shoulder pain 06/10/2022   Generalized weakness    Nausea vomiting and diarrhea    Chronic obstructive pulmonary disease, unspecified (HCC) 10/02/2021   Gouty arthropathy, chronic, 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   Hypothyroidism 09/01/2019   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/25/2019   Multinodular goiter 07/31/2019   Class 2 severe obesity with serious comorbidity in adult [facility]) 12/12/2018   Type 2 diabetes mellitus with obesity (HCC) 09/04/2017   Bilateral lower extremity edema 09/04/2017   Gastroesophageal reflux disease without esophagitis 05/16/2017   Vomiting and diarrhea 12/29/2016   Enteritis 12/29/2016   Acute lower  UTI 12/29/2016   Incomplete emptying of bladder 11/01/2016   Urge incontinence 07/14/2015   Acute renal failure superimposed on stage 4 chronic kidney disease (HCC) 08/06/2014   Renal mass, right 08/06/2014   Precordial pain 07/24/2014   Dyspnea 07/24/2014   Essential hypertension 07/24/2014   Hyperlipidemia 07/24/2014   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 no significant CAD, normal LVEDP.    CATARACT EXTRACTION     CHOLECYSTECTOMY     COLONOSCOPY     COLONOSCOPY WITH PROPOFOL N/A 12/07/2016   Procedure: COLONOSCOPY WITH PROPOFOL;  Surgeon: Christena Deem, MD;  Location: [facility] ENDOSCOPY;  Service: Endoscopy;  Laterality: N/A;   ESOPHAGOGASTRODUODENOSCOPY     ESOPHAGOGASTRODUODENOSCOPY (EGD) WITH PROPOFOL N/A 12/07/2016   Procedure: ESOPHAGOGASTRODUODENOSCOPY (EGD) WITH PROPOFOL;  Surgeon: Christena Deem, MD;  Location: [facility] ENDOSCOPY;  Service: Endoscopy;  Laterality: N/A;   ESOPHAGOGASTRODUODENOSCOPY (EGD) WITH PROPOFOL N/A 01/07/2018   Procedure: ESOPHAGOGASTRODUODENOSCOPY (EGD) WITH PROPOFOL;  Surgeon: Christena Deem, MD;  Location: [facility] ENDOSCOPY;  Service: Endoscopy;  Laterality: N/A;   EYE SURGERY     HEMATOMA EVACUATION Right 08/02/2023   Procedure: EVACUATION HEMATOMA;  Surgeon: Leafy Ro, MD;  Location: [facility] ORS;  Service: General;  Laterality: Right;   HEMORRHOID SURGERY     PARTIAL HYSTERECTOMY     THYROIDECTOMY N/A 08/25/2019   Procedure: THYROIDECTOMY EXTRACTION OF SUBTOTAL COMPONENT; PARATHYROID AUTOTRANSPLANT X1;  Surgeon: Duanne Guess, MD;  Location: [facility] ORS;  Service: General;  Laterality: N/A;  With Nerve Monitoring(RLN)   TONSILLECTOMY      OB History   No obstetric history on file.      Home Medications    Prior to Admission medications   Medication Sig Start Date End Date Taking? Authorizing Provider  albuterol (PROVENTIL HFA;VENTOLIN HFA) 108 (90 BASE) MCG/ACT inhaler Inhale 2 puffs into the lungs every 6 (six)  hours as needed for wheezing or shortness of breath.   Yes [provider]  amLODipine (NORVASC) 5 MG tablet Take 1 tablet (5 mg total) by mouth daily. 08/06/23 08/05/24 Yes Sunnie Nielsen, DO  apixaban (ELIQUIS) 2.5 MG TABS tablet Take 1 tablet (2.5 mg total) by mouth 2 (two) times daily. 06/04/23 06/03/24 Yes Gillis Santa, MD  azithromycin (ZITHROMAX Z-PAK) 250 MG tablet Take 2 tablets (500 mg) on  Day 1,  followed by 1 tablet (250 mg) once daily on Days 2 through 5. 09/07/23 09/12/23 Yes Lorianna Spadaccini, Lonia Skinner, PA-C  fluticasone-salmeterol (ADVAIR) 500-50 MCG/ACT AEPB Inhale 1 puff into the lungs in the morning and at bedtime.   Yes [provider]  hydrALAZINE (APRESOLINE) 100 MG tablet Take 1 tablet (100 mg total) by mouth 3 (three) times daily. 04/22/23  Yes Wouk, Wilfred Curtis, MD  levothyroxine (SYNTHROID) 150 MCG tablet Take 150 mcg by mouth daily.   Yes [provider]  metoprolol tartrate (LOPRESSOR) 50 MG tablet Take 25 mg by mouth 2 (two) times daily.  Yes [provider]  montelukast (SINGULAIR) 10 MG tablet Take 10 mg by mouth at bedtime.   Yes [provider]  potassium chloride (KLOR-CON) 10 MEQ tablet Take 10 mEq by mouth daily. 05/30/23 05/29/24 Yes [provider]  rosuvastatin (CRESTOR) 40 MG tablet Take 40 mg by mouth daily.   Yes [provider]  sertraline (ZOLOFT) 100 MG tablet Take 100 mg by mouth daily.   Yes [provider]  Vitamin D, Ergocalciferol, (DRISDOL) 1.25 MG (50000 UNIT) CAPS capsule Take 1 capsule (50,000 Units total) by mouth every 7 (seven) days. 06/10/23 09/08/23 Yes Gillis Santa, MD  acetaminophen (TYLENOL) 500 MG tablet Take 1,000 mg by mouth every 6 (six) hours as needed for mild pain or moderate pain.    [provider]  calcium carbonate (TUMS - DOSED IN MG ELEMENTAL CALCIUM) 500 MG chewable tablet Chew 1-2 tablets (200-400 mg of elemental calcium total) by mouth 3 (three) times daily as  needed for indigestion or heartburn. 08/06/23   Sunnie Nielsen, DO  esomeprazole (NEXIUM) 40 MG capsule Take 40 mg by mouth daily before breakfast.    [provider]  fluticasone (FLONASE) 50 MCG/ACT nasal spray Place 2 sprays into both nostrils daily.    [provider]  furosemide (LASIX) 40 MG tablet Take 1 tablet (40 mg total) by mouth 2 (two) times daily. 06/04/23 09/02/23  Gillis Santa, MD  ipratropium-albuterol (DUONEB) 0.5-2.5 (3) MG/3ML SOLN Take 3 mLs by nebulization every 6 (six) hours as needed (SOB). 03/27/23   Arnetha Courser, MD  lidocaine (LIDODERM) 5 % Place 1 patch onto the skin every 12 (twelve) hours. Remove & Discard patch within 12 hours or as directed by MD 06/08/22   Sharman Cheek, MD  loperamide (IMODIUM) 2 MG capsule Take 1 capsule (2 mg total) by mouth as needed for diarrhea or loose stools. 08/05/22   Arnetha Courser, MD  Multiple Minerals-Vitamins (CALCIUM & VIT D3 BONE HEALTH PO) Take 1 tablet by mouth daily. 600 mg/ 25 mg    [provider]  polyethylene glycol (MIRALAX / GLYCOLAX) 17 g packet Take 17 g by mouth daily as needed for mild constipation or moderate constipation. 08/06/23   Sunnie Nielsen, DO  predniSONE (DELTASONE) 10 MG tablet 1 tablet daily 08/23/23   Erin Fulling, MD  senna-docusate (SENOKOT-S) 8.6-50 MG tablet Take 2 tablets by mouth at bedtime as needed for mild constipation. 08/06/23   Sunnie Nielsen, DO    Family History Family History  Problem Relation Age of Onset   Breast cancer Mother    Heart attack Father     Social History Social History   Tobacco Use   Smoking status: Never   Smokeless tobacco: Never  Vaping Use   Vaping status: Never Used  Substance Use Topics   Alcohol use: No   Drug use: No     Allergies   Ace inhibitors, Egg-derived products, Other, Prednisone, Risedronate, Sulfa antibiotics, and Sulfasalazine   Review of Systems Review of Systems  Respiratory:  Positive for cough.    All other systems reviewed and are negative.    Physical Exam Triage Vital Signs ED Triage Vitals  Encounter Vitals Group     BP 09/07/23 1230 (!) 151/64     Systolic BP Percentile --      Diastolic BP Percentile --      Pulse Rate 09/07/23 1230 (!) 51     Resp 09/07/23 1230 17     Temp 09/07/23 1230 98.1  F (36.7 C)     Temp Source 09/07/23 1230 Oral     SpO2 09/07/23 1230 95 %     Weight --      Height --      Head Circumference --      Peak Flow --      Pain Score 09/07/23 1241 0     Pain Loc --      Pain Education --      Exclude from Growth Chart --    No data found.  Updated Vital Signs BP (!) 151/64 (BP Location: Left Arm)   Pulse (!) 51   Temp 98.1 F (36.7 C) (Oral)   Resp 17   SpO2 95%   Visual Acuity Right Eye Distance:   Left Eye Distance:   Bilateral Distance:    Right Eye Near:   Left Eye Near:    Bilateral Near:     Physical Exam Vitals and nursing note reviewed.  Constitutional:      Appearance: She is well-developed.  HENT:     Head: Normocephalic.     Nose: Nose normal.  Cardiovascular:     Rate and Rhythm: Normal rate.  Pulmonary:     Effort: Pulmonary effort is normal.  Abdominal:     General: There is no distension.  Musculoskeletal:        General: Normal range of motion.     Cervical back: Normal range of motion.  Skin:    General: Skin is warm.  Neurological:     General: No focal deficit present.     Mental Status: She is alert and oriented to person, place, and time.      UC Treatments / Results  Labs (all labs ordered are listed, but only abnormal results are displayed) Labs Reviewed - No data to display  EKG   Radiology DG Chest 2 View  Result Date: 09/07/2023 CLINICAL DATA:  cough EXAM: CHEST - 2 VIEW COMPARISON:  CXR 07/30/23 FINDINGS: Cardiomegaly. No pleural effusion. No pneumothorax. Prominent bilateral interstitial opacities could represent pulmonary venous congestion. No radiographically apparent  displaced rib fractures. Visualized upper abdomen is unremarkable. There is exaggerated thoracic kyphosis. Vertebral body heights are maintained. IMPRESSION: Cardiomegaly with prominent bilateral interstitial opacities, which could represent pulmonary venous congestion. Electronically Signed   By: Lorenza Cambridge M.D.   On: 09/07/2023 13:59    Procedures Procedures (including critical care time)  Medications Ordered in UC Medications - No data to display  Initial Impression / Assessment and Plan / UC Course  I have reviewed the triage vital signs and the nursing notes.  Pertinent labs & imaging results that were available during my care of the patient were reviewed by me and considered in my medical decision making (see chart for details).     Chest x-ray shows bilateral interstitial opacities radiologist reports this could represent pulmonary venous congestion.  Patient's symptoms concerning for exacerbation of COPD/bronchitis.  I will treat patient with Zithromax.  She is advised to follow-up with her primary care physician for recheck. Final Clinical Impressions(s) / UC Diagnoses   Final diagnoses:  Acute cough  Acute bronchitis, unspecified organism     Discharge Instructions      Use your inhaler 2 puffs every 4 hours    ED Prescriptions     Medication Sig Dispense Auth. Provider   azithromycin (ZITHROMAX Z-PAK) 250 MG tablet Take 2 tablets (500 mg) on  Day 1,  followed by 1 tablet (250 mg) once daily  on Days 2 through 5. 6 each Elson Areas, PA-C      PDMP not reviewed this encounter. An After Visit Summary was printed and given to the patient.       Elson Areas, [place] 09/07/23 1451

## 2023-09-10 ENCOUNTER — Encounter: Payer: Medicare Other | Attending: Physician Assistant | Admitting: Physician Assistant

## 2023-09-10 DIAGNOSIS — E11622 Type 2 diabetes mellitus with other skin ulcer: Secondary | ICD-10-CM | POA: Insufficient documentation

## 2023-09-10 DIAGNOSIS — X58XXXA Exposure to other specified factors, initial encounter: Secondary | ICD-10-CM | POA: Diagnosis not present

## 2023-09-10 DIAGNOSIS — Z7901 Long term (current) use of anticoagulants: Secondary | ICD-10-CM | POA: Diagnosis not present

## 2023-09-10 DIAGNOSIS — L97812 Non-pressure chronic ulcer of other part of right lower leg with fat layer exposed: Secondary | ICD-10-CM | POA: Insufficient documentation

## 2023-09-10 DIAGNOSIS — S81811A Laceration without foreign body, right lower leg, initial encounter: Secondary | ICD-10-CM | POA: Insufficient documentation

## 2023-09-10 DIAGNOSIS — I480 Paroxysmal atrial fibrillation: Secondary | ICD-10-CM | POA: Insufficient documentation

## 2023-09-10 DIAGNOSIS — J4489 Other specified chronic obstructive pulmonary disease: Secondary | ICD-10-CM | POA: Insufficient documentation

## 2023-09-10 DIAGNOSIS — I132 Hypertensive heart and chronic kidney disease with heart failure and with stage 5 chronic kidney disease, or end stage renal disease: Secondary | ICD-10-CM | POA: Diagnosis not present

## 2023-09-10 DIAGNOSIS — N186 End stage renal disease: Secondary | ICD-10-CM | POA: Insufficient documentation

## 2023-09-10 DIAGNOSIS — G4730 Sleep apnea, unspecified: Secondary | ICD-10-CM | POA: Diagnosis not present

## 2023-09-10 DIAGNOSIS — N184 Chronic kidney disease, stage 4 (severe): Secondary | ICD-10-CM | POA: Diagnosis not present

## 2023-09-10 DIAGNOSIS — I509 Heart failure, unspecified: Secondary | ICD-10-CM | POA: Insufficient documentation

## 2023-09-10 DIAGNOSIS — E1122 Type 2 diabetes mellitus with diabetic chronic kidney disease: Secondary | ICD-10-CM | POA: Diagnosis not present

## 2023-09-10 NOTE — Progress Notes (Addendum)
[name] [name] ([id number]) [id number] Page 1 of 9 Visit Report for 09/10/2023 Arrival Information Details Patient Name: Date of Service: [name] [name] 09/10/2023 9:45 A M Medical Record Number: [id number] Patient Account Number: [id number] Date of Birth/Sex: Treating RN: 1940/11/11 (82 y.o. [name] Primary Care Keziah Avis: Einar Crow Other Clinician: Referring Lizvette Lightsey: Treating Bernhard Koskinen/Extender: Johny Shears in Treatment: 2 Visit Information History Since Last Visit Added or deleted any medications: Yes Patient Arrived: Wheel Chair Any new allergies or adverse reactions: No Arrival Time: 10:13 Had a fall or experienced change in No Accompanied By: family activities of daily living that may affect Transfer Assistance: None risk of falls: Patient Identification Verified: Yes Hospitalized since last visit: No Secondary Verification Process Completed: Yes Has Dressing in Place as Prescribed: Yes Patient Has Alerts: Yes Has Compression in Place as Prescribed: No Patient Alerts: Patient on Blood Thinner Pain Present Now: No Fistula LUE type 2 diabetic Electronic Signature(s) Signed: 09/10/2023 4:14:58 PM By: Angelina Pih Entered By: Angelina Pih on 09/10/2023 10:14:14 -------------------------------------------------------------------------------- Clinic Level of Care Assessment Details Patient Name: Date of Service: [name] [name] 09/10/2023 9:45 A M Medical Record Number: [id number] Patient Account Number: [id number] Date of Birth/Sex: Treating RN: 09/21/1941 (82 y.o. [name] Primary Care Xylon Croom: Einar Crow Other Clinician: Referring Nidhi Jacome: Treating Jontavious Commons/Extender: Johny Shears in Treatment: 2 Clinic Level of Care Assessment Items TOOL 1 Quantity Score []  - 0 Use when EandM and Procedure is performed on INITIAL visit ASSESSMENTS - Nursing  Assessment / Reassessment []  - 0 General Physical Exam (combine w/ comprehensive assessment (listed just below) when performed on new pt. evals) []  - 0 Comprehensive Assessment (HX, ROS, Risk Assessments, Wounds Hx, etc.) ASSESSMENTS - Wound and Skin Assessment / Reassessment []  - 0 Dermatologic / Skin Assessment (not related to wound area) [name], [name] [name] ([id number]) [id number] Page 2 of 9 ASSESSMENTS - Ostomy and/or Continence Assessment and Care []  - 0 Incontinence Assessment and Management []  - 0 Ostomy Care Assessment and Management (repouching, etc.) PROCESS - Coordination of Care []  - 0 Simple Patient / Family Education for ongoing care []  - 0 Complex (extensive) Patient / Family Education for ongoing care []  - 0 Staff obtains Chiropractor, Records, T Results / Process Orders est []  - 0 Staff telephones HHA, Nursing Homes / Clarify orders / etc []  - 0 Routine Transfer to another Facility (non-emergent condition) []  - 0 Routine Hospital Admission (non-emergent condition) []  - 0 New Admissions / Manufacturing engineer / Ordering NPWT Apligraf, etc. , []  - 0 Emergency Hospital Admission (emergent condition) PROCESS - Special Needs []  - 0 Pediatric / Minor Patient Management []  - 0 Isolation Patient Management []  - 0 Hearing / Language / Visual special needs []  - 0 Assessment of Community assistance (transportation, D/C planning, etc.) []  - 0 Additional assistance / Altered mentation []  - 0 Support Surface(s) Assessment (bed, cushion, seat, etc.) INTERVENTIONS - Miscellaneous []  - 0 External ear exam []  - 0 Patient Transfer (multiple staff / Nurse, adult / Similar devices) []  - 0 Simple Staple / Suture removal (25 or less) []  - 0 Complex Staple / Suture removal (26 or more) []  - 0 Hypo/Hyperglycemic Management (do not check if billed separately) []  - 0 Ankle / Brachial Index (ABI) - do not check if billed separately Has the patient  been seen at the hospital within the last three years: Yes Total Score: 0 Level Of Care: ____ Electronic Signature(s) Signed: 09/10/2023  4:14:58 PM By: Angelina Pih Entered By: Angelina Pih on 09/10/2023 10:43:25 -------------------------------------------------------------------------------- Encounter Discharge Information Details Patient Name: Date of Service: [name], [name] 09/10/2023 9:45 A M Medical Record Number: [id number] Patient Account Number: [id number] Date of Birth/Sex: Treating RN: 15-Apr-1941 (82 y.o. [name] Primary Care Alyzza Andringa: Einar Crow Other Clinician: Referring Aadhya Bustamante: Treating Helder Crisafulli/Extender: Johny Shears in Treatment: 2 Encounter Discharge Information Items Post Procedure [name], [name] ([id number]) [id number] Page 3 of 9 Discharge Condition: Stable Temperature (F): 97.7 Ambulatory Status: Wheelchair Pulse (bpm): 71 Discharge Destination: Home Respiratory Rate (breaths/min): 20 Transportation: Private Auto Blood Pressure (mmHg): 182/66 Accompanied By: family Schedule Follow-up Appointment: Yes Clinical Summary of Care: Electronic Signature(s) Signed: 09/10/2023 4:14:58 PM By: Angelina Pih Entered By: Angelina Pih on 09/10/2023 10:44:27 -------------------------------------------------------------------------------- Lower Extremity Assessment Details Patient Name: Date of Service: [name], [name] 09/10/2023 9:45 A M Medical Record Number: [id number] Patient Account Number: [id number] Date of Birth/Sex: Treating RN: 1941/09/27 (82 y.o. [name] Primary Care Obdulia Steier: Einar Crow Other Clinician: Referring Luci Bellucci: Treating Neeka Urista/Extender: Johny Shears in Treatment: 2 Edema Assessment Assessed: [Left: No] [Right: No] Edema: [Left: N] [Right: o] Vascular Assessment Pulses: Dorsalis Pedis Palpable:  [Right:Yes] Extremity colors, hair growth, and conditions: Extremity Color: [Right:Normal] Hair Growth on Extremity: [Right:No] Temperature of Extremity: [Right:Warm < 3 seconds] Toe Nail Assessment Left: Right: Thick: No Discolored: No Deformed: No Improper Length and Hygiene: No Electronic Signature(s) Signed: 09/10/2023 4:14:58 PM By: Angelina Pih Entered By: Angelina Pih on 09/10/2023 10:17:51 Lowenstein, Alben Spittle ([id number]) [id number] Page 4 of 9 -------------------------------------------------------------------------------- Multi Wound Chart Details Patient Name: Date of Service: [name], [name] 09/10/2023 9:45 A M Medical Record Number: [id number] Patient Account Number: [id number] Date of Birth/Sex: Treating RN: June 22, 1941 (82 y.o. [name] Primary Care Mikaeel Petrow: Einar Crow Other Clinician: Referring Isla Sabree: Treating Bridgit Eynon/Extender: Johny Shears in Treatment: 2 Vital Signs Height(in): 62 Pulse(bpm): 71 Weight(lbs): 160 Blood Pressure(mmHg): 182/66 Body Mass Index(BMI): 29.3 Temperature(F): 97.7 Respiratory Rate(breaths/min): 20 [1:Photos:] [N/A:N/A] Right, Lateral Lower Leg N/A N/A Wound Location: Skin Tear/Laceration N/A N/A Wounding Event: Skin Tear N/A N/A Primary Etiology: Anemia, Asthma, Chronic Obstructive N/A N/A Comorbid History: Pulmonary Disease (COPD), Sleep Apnea, Arrhythmia, Congestive Heart Failure, Hypertension, Type II Diabetes, End Stage Renal Disease, Gout 08/21/2023 N/A N/A Date Acquired: 2 N/A N/A Weeks of Treatment: Open N/A N/A Wound Status: No N/A N/A Wound Recurrence: 5.3x5.5x0.2 N/A N/A Measurements L x W x D (cm) 22.894 N/A N/A A (cm) : rea 4.579 N/A N/A Volume (cm) : 22.90% N/A N/A % Reduction in Area: 69.20% N/A N/A % Reduction in Volume: Full Thickness Without Exposed N/A N/A Classification: Support Structures Medium N/A N/A Exudate A  mount: Serosanguineous N/A N/A Exudate Type: red, brown N/A N/A Exudate Color: Small (1-33%) N/A N/A Granulation A mount: Red, Hyper-granulation N/A N/A Granulation Quality: Large (67-100%) N/A N/A Necrotic A mount: Fat Layer (Subcutaneous Tissue): Yes N/A N/A Exposed Structures: None N/A N/A Epithelialization: Treatment Notes Electronic Signature(s) Signed: 09/10/2023 4:14:58 PM By: Angelina Pih Entered By: Angelina Pih on 09/10/2023 10:18:05 [place], Alben Spittle ([id number]) [id number] Page 5 of 9 -------------------------------------------------------------------------------- Multi-Disciplinary Care Plan Details Patient Name: Date of Service: [place], [name] 09/10/2023 9:45 A M Medical Record Number: [id number] Patient Account Number: [id number] Date of Birth/Sex: Treating RN: Dec 07, 1940 (82 y.o. [name] Primary Care Kaelin Holford: Einar Crow Other Clinician: Referring Eldoris Beiser: Treating Allin Frix/Extender: Johny Shears in Treatment: 2  Active Inactive Necrotic Tissue Nursing Diagnoses: Impaired tissue integrity related to necrotic/devitalized tissue Knowledge deficit related to management of necrotic/devitalized tissue Goals: Necrotic/devitalized tissue will be minimized in the wound bed Date Initiated: 08/23/2023 Target Resolution Date: 10/18/2023 Goal Status: Active Patient/caregiver will verbalize understanding of reason and process for debridement of necrotic tissue Date Initiated: 08/23/2023 Date Inactivated: 08/23/2023 Target Resolution Date: 08/23/2023 Goal Status: Met Interventions: Assess patient pain level pre-, during and post procedure and prior to discharge Provide education on necrotic tissue and debridement process Treatment Activities: Apply topical anesthetic as ordered : 08/23/2023 Excisional debridement : 08/23/2023 Notes: Wound/Skin Impairment Nursing Diagnoses: Impaired tissue  integrity Knowledge deficit related to ulceration/compromised skin integrity Goals: Ulcer/skin breakdown will have a volume reduction of 30% by week 4 Date Initiated: 08/23/2023 Target Resolution Date: 09/20/2023 Goal Status: Active Ulcer/skin breakdown will have a volume reduction of 50% by week 8 Date Initiated: 08/23/2023 Target Resolution Date: 10/18/2023 Goal Status: Active Ulcer/skin breakdown will have a volume reduction of 80% by week 12 Date Initiated: 08/23/2023 Target Resolution Date: 11/15/2023 Goal Status: Active Ulcer/skin breakdown will heal within 14 weeks Date Initiated: 08/23/2023 Target Resolution Date: 11/29/2023 Goal Status: Active Interventions: Assess patient/caregiver ability to obtain necessary supplies Assess patient/caregiver ability to perform ulcer/skin care regimen upon admission and as needed Assess ulceration(s) every visit Provide education on ulcer and skin care Notes: [name], [name] ([id number]) [id number].pdf Page 6 of 9 Electronic Signature(s) Signed: 09/10/2023 4:14:58 PM By: Angelina Pih Entered By: Angelina Pih on 09/10/2023 10:43:43 -------------------------------------------------------------------------------- Pain Assessment Details Patient Name: Date of Service: [name], [name] 09/10/2023 9:45 A M Medical Record Number: [id number] Patient Account Number: [id number] Date of Birth/Sex: Treating RN: 1941-05-16 (82 y.o. [name] Primary Care Magnolia Mattila: Einar Crow Other Clinician: Referring Kimika Streater: Treating Jeston Junkins/Extender: Johny Shears in Treatment: 2 Active Problems Location of Pain Severity and Description of Pain Patient Has Paino No Site Locations Pain Management and Medication Current Pain Management: Electronic Signature(s) Signed: 09/10/2023 4:14:58 PM By: Angelina Pih Entered By: Angelina Pih on 09/10/2023 10:14:50 Patient/Caregiver Education  Details -------------------------------------------------------------------------------- Jettie Pagan ([id number]) [id number] Page 7 of 9 Patient Name: Date of Service: [name] [name] [name] 12/9/2024andnbsp9:45 A M Medical Record Number: [id number] Patient Account Number: [id number] Date of Birth/Gender: Treating RN: August 20, 1941 (82 y.o. [name] Primary Care Physician: Einar Crow Other Clinician: Referring Physician: Treating Physician/Extender: Johny Shears in Treatment: 2 Education Assessment Education Provided To: Patient Education Topics Provided Wound/Skin Impairment: Handouts: Caring for Your Ulcer Methods: Explain/Verbal Responses: State content correctly Electronic Signature(s) Signed: 09/10/2023 4:14:58 PM By: Angelina Pih Entered By: Angelina Pih on 09/10/2023 10:43:51 -------------------------------------------------------------------------------- Wound Assessment Details Patient Name: Date of Service: [name] Broach [name]. 09/10/2023 9:45 A M Medical Record Number: [id number] Patient Account Number: [id number] Date of Birth/Sex: Treating RN: 1941-01-01 (82 y.o. [name] Primary Care Kalei Mckillop: Einar Crow Other Clinician: Referring Tyasia Packard: Treating Dondra Rhett/Extender: Johny Shears in Treatment: 2 Wound Status Wound Number: 1 Primary Skin T ear Etiology: Wound Location: Right, Lateral Lower Leg Wound Open Wounding Event: Skin Tear/Laceration Status: Date Acquired: 08/21/2023 Comorbid Anemia, Asthma, Chronic Obstructive Pulmonary Disease (COPD), Weeks Of Treatment: 2 History: Sleep Apnea, Arrhythmia, Congestive Heart Failure, Hypertension, Clustered Wound: No Type II Diabetes, End Stage Renal Disease, Gout Photos Wound Measurements Length: (cm) 5.3 Width: (cm) 5.5 Depth: (cm) 0.2 [name] [name] [name] ([id number]) Area: (cm) 22.894 Volume: (cm) 4.579 %  Reduction in Area: 22.9% % Reduction in Volume:  69.2% Epithelialization: None [id number] Page 8 of 9 Tunneling: No Undermining: No Wound Description Classification: Full Thickness Without Exposed Support Structures Exudate Amount: Medium Exudate Type: Serosanguineous Exudate Color: red, brown Foul Odor After Cleansing: No Slough/Fibrino Yes Wound Bed Granulation Amount: Small (1-33%) Exposed Structure Granulation Quality: Red, Hyper-granulation Fat Layer (Subcutaneous Tissue) Exposed: Yes Necrotic Amount: Large (67-100%) Necrotic Quality: Adherent Slough Treatment Notes Wound #1 (Lower Leg) Wound Laterality: Right, Lateral Cleanser [place]ram Ancillary Kit - 15 Day Supply Discharge Instruction: Use supplies as instructed; Kit contains: (15) Saline Bullets; (15) 3x3 Gauze; 15 pr Gloves Wound Cleanser Discharge Instruction: Wash your hands with soap and water. Remove old dressing, discard into plastic bag and place into trash. Cleanse the wound with Wound Cleanser prior to applying a clean dressing using gauze sponges, not tissues or cotton balls. Do not scrub or use excessive force. Pat dry using gauze sponges, not tissue or cotton balls. Peri-Wound Care Topical Primary Dressing Hydrofera Blue Ready Transfer Foam, 4x5 (in/in) Discharge Instruction: Apply Hydrofera Blue Ready to wound bed as directed Secondary Dressing ABD Pad 5x9 (in/in) Discharge Instruction: Cover with ABD pad (BORDER) Zetuvit Plus SILICONE BORDER Dressing 5x5 (in/in) Discharge Instruction: Please do not put silicone bordered dressings under wraps. Use non-bordered dressing only. Secured With Medipore T - 37M Medipore H Soft Cloth Surgical T ape ape, 2x2 (in/yd) Kerlix Roll Sterile or Non-Sterile 6-ply 4.5x4 (yd/yd) Discharge Instruction: Apply Kerlix as directed Compression Wrap Compression Stockings Add-Ons Electronic Signature(s) Signed: 09/10/2023 4:14:58 PM By: Angelina Pih Entered By: Angelina Pih on 09/10/2023 10:17:31 -------------------------------------------------------------------------------- Vitals Details Patient Name: Date of Service: [place] [name] [name] Rector [name]. 09/10/2023 9:45 A M Medical Record Number: [id number] Patient Account Number: [id number] Date of Birth/Sex: Treating RN: [birth year] (82 y.o. [name] [name], [place], [name] [name] ([id number]) [id number] Page 9 of 9 Primary Care Talayla Doyel: Einar Crow Other Clinician: Referring Raenell Mensing: Treating Emigdio Wildeman/Extender: Johny Shears in Treatment: 2 Vital Signs Time Taken: 10:10 Temperature (F): 97.7 Height (in): 62 Pulse (bpm): 71 Weight (lbs): 160 Respiratory Rate (breaths/min): 20 Body Mass Index (BMI): 29.3 Blood Pressure (mmHg): 182/66 Reference Range: 80 - 120 mg / dl Notes pt asymptomatic, pt states have not taken meds yet today Electronic Signature(s) Signed: 09/10/2023 4:14:58 PM By: Angelina Pih Entered By: Angelina Pih on 09/10/2023 10:14:46

## 2023-09-10 NOTE — Progress Notes (Signed)
WAVE, POCOCK ([id number]) [id number] Page 1 of 8 Visit Report for 09/10/2023 Chief Complaint Document Details Patient Name: Date of Service: [name], [name] [place] 09/10/2023 9:45 A M Medical Record Number: [id number] Patient Account Number: [id number] Date of Birth/Sex: Treating RN: [birth year] (82 y.o. [name] Primary Care Provider: Einar Crow Other Clinician: Referring Provider: Treating Provider/Extender: Johny Shears in Treatment: 2 Information Obtained from: Patient Chief Complaint Right LE Ulcer Electronic Signature(s) Signed: 09/10/2023 10:02:36 AM By: Allen Derry PA-C Entered By: Allen Derry on 09/10/2023 10:02:36 -------------------------------------------------------------------------------- Debridement Details Patient Name: Date of Service: [name] Broach E. 09/10/2023 9:45 A M Medical Record Number: [id number] Patient Account Number: [id number] Date of Birth/Sex: Treating RN: [birth year] (82 y.o. [name] Primary Care Provider: Einar Crow Other Clinician: Referring Provider: Treating Provider/Extender: Johny Shears in Treatment: 2 Debridement Performed for Assessment: Wound #1 Right,Lateral Lower Leg Performed By: Physician Allen Derry, PA-C The following information was scribed by: Angelina Pih The information was scribed for: Allen Derry Debridement Type: Debridement Level of Consciousness (Pre-procedure): Awake and Alert Pre-procedure Verification/Time Out Yes - 10:38 Taken: Pain Control: Lidocaine 4% T opical Solution Percent of Wound Bed Debrided: 100% T Area Debrided (cm): otal 22.88 Tissue and other material debrided: Viable, Non-Viable, Slough, Subcutaneous, Slough, Hyper-granulation Level: Skin/Subcutaneous Tissue Debridement Description: Excisional Instrument: Curette Bleeding: Moderate Hemostasis Achieved: Silver Nitrate Response to Treatment:  Procedure was tolerated well Level of Consciousness [name], [name] ([id number]) [id number] Page 2 of 8 Level of Consciousness (Post- Awake and Alert procedure): Post Debridement Measurements of Total Wound Length: (cm) 5.3 Width: (cm) 5.5 Depth: (cm) 0.3 Volume: (cm) 6.868 Character of Wound/Ulcer Post Debridement: Stable Post Procedure Diagnosis Same as Pre-procedure Notes 1 stick used Electronic Signature(s) Signed: 09/10/2023 4:09:48 PM By: Demetria Pore Signed: 09/10/2023 4:14:58 PM By: Angelina Pih Signed: 09/12/2023 7:54:12 PM By: Allen Derry PA-C Entered By: Angelina Pih on 09/10/2023 10:47:00 -------------------------------------------------------------------------------- HPI Details Patient Name: Date of Service: [name], [name] Rector E. 09/10/2023 9:45 A M Medical Record Number: [id number] Patient Account Number: [id number] Date of Birth/Sex: Treating RN: 1941/02/19 (82 y.o. [name] Primary Care Provider: Einar Crow Other Clinician: Referring Provider: Treating Provider/Extender: Johny Shears in Treatment: 2 History of Present Illness HPI Description: 08-23-2023 upon evaluation today patient presents for initial inspection here in our clinic due to an issue that she has been having with a skin tear of the right lateral lower extremity where she tells me that a storm door was going back into her leg causing a significant skin tear. With her leg hurting she unfortunately noted around July 23, 2023 that she was having a regular heart rate where it was jumping up going back down and it was so erratic and irregular she ended up calling an ambulance to take her to the hospital this ended up with a hospital stent that went from the July 23, 2023 through August 06, 2023. During the course of that hospital stay on 08-02-2023 she had an OR debridement it sounds like she may have had a  significant hematoma based on what I am seeing and hearing. Following that time she has been using some Xeroform here and there and that has helped to some degree right now she tells me she has not been using that as she did not have any more of that at this point. She did have an x-ray on 07-30-2023 which was negative for any signs of fracture or acute  bony abnormality. Patient does have a history of diabetes mellitus type 2, hypertension, COPD, chronic kidney disease stage IV, atrial fibrillation, long-term use of anticoagulant therapy. 08-27-2023 upon evaluation today patient appears to be doing well currently in regard to her wound. This is actually showing signs of good improvement she had a lot of bleeding last week after debridement this was necessary but nonetheless she tells me that it really did bleed a lot. She was able to finally get it stop she did get her supplies she was very appreciative of this as well. Fortunately I do not see any signs of active infection locally or systemically at this time. 09-10-2023 upon evaluation today patient appears to be doing better in regard to her wound from a depth perspective although there is some need for debridement here today. I discussed that with her she had quite a bit of hypergranulation and slough buildup. She actually did allow for debridement and I was able to clean away the necrotic debris quite well without complications. Electronic Signature(s) Signed: 09/10/2023 5:38:39 PM By: Allen Derry PA-C Entered By: Allen Derry on 09/10/2023 17:38:39 [name], [name] ([id number]) [id number] Page 3 of 8 -------------------------------------------------------------------------------- Physical Exam Details Patient Name: Date of Service: [name], [name] 09/10/2023 9:45 A M Medical Record Number: [id number] Patient Account Number: [id number] Date of Birth/Sex: Treating RN: [birth year] (82 y.o. [name] Primary  Care Provider: Einar Crow Other Clinician: Referring Provider: Treating Provider/Extender: Johny Shears in Treatment: 2 Constitutional Well-nourished and well-hydrated in no acute distress. Respiratory normal breathing without difficulty. Psychiatric this patient is able to make decisions and demonstrates good insight into disease process. Alert and Oriented x 3. pleasant and cooperative. Notes Patient's wound bed did require sharp debridement I removed slough and biofilm as well as hypergranulation tissue down to good subcutaneous tissue and she tolerated this today without complication. Postdebridement the wound bed is significantly improved I am very pleased with that I did use some silver nitrate for some areas that were bleeding. Electronic Signature(s) Signed: 09/10/2023 5:39:16 PM By: Allen Derry PA-C Entered By: Allen Derry on 09/10/2023 17:39:16 -------------------------------------------------------------------------------- Physician Orders Details Patient Name: Date of Service: [name] Broach E. 09/10/2023 9:45 A M Medical Record Number: [id number] Patient Account Number: [id number] Date of Birth/Sex: Treating RN: [birth year] (82 y.o. [name] Primary Care Provider: Einar Crow Other Clinician: Referring Provider: Treating Provider/Extender: Johny Shears in Treatment: 2 The following information was scribed by: Angelina Pih The information was scribed for: Allen Derry Verbal / Phone Orders: No Diagnosis Coding ICD-10 Coding Code Description [phone] Laceration without foreign body, right lower leg, initial encounter E11.622 Type 2 diabetes mellitus with other skin ulcer L97.812 Non-pressure chronic ulcer of other part of right lower leg with fat layer exposed I10 Essential (primary) hypertension J44.89 Other specified chronic obstructive pulmonary disease SHALIA, SCHOMMER E ([id number])  [id number] Page 4 of 8 N18.4 Chronic kidney disease, stage 4 (severe) I48.0 Paroxysmal atrial fibrillation Z79.01 Long term (current) use of anticoagulants Follow-up Appointments Return Appointment in 1 week. Bathing/ Shower/ Hygiene May shower; gently cleanse wound with antibacterial soap, rinse and pat dry prior to dressing wounds - Use Dial original GOLD soap to clean wound after showering No tub bath. Anesthetic (Use 'Patient Medications' Section for Anesthetic Order Entry) Lidocaine applied to wound bed Edema Control - Orders / Instructions Elevate, Exercise Daily and A void Standing for Long Periods of Time. Elevate legs to the level of the heart and  pump ankles as often as possible Elevate leg(s) parallel to the floor when sitting. DO YOUR BEST to sleep in the bed at night. DO NOT sleep in your recliner. Long hours of sitting in a recliner leads to swelling of the legs and/or potential wounds on your backside. Wound Treatment Wound #1 - Lower Leg Wound Laterality: Right, Lateral Cleanser: Byram Ancillary Kit - 15 Day Supply (Generic) 3 x Per Week/15 Days Discharge Instructions: Use supplies as instructed; Kit contains: (15) Saline Bullets; (15) 3x3 Gauze; 15 pr Gloves Cleanser: Wound Cleanser 3 x Per Week/15 Days Discharge Instructions: Wash your hands with soap and water. Remove old dressing, discard into plastic bag and place into trash. Cleanse the wound with Wound Cleanser prior to applying a clean dressing using gauze sponges, not tissues or cotton balls. Do not scrub or use excessive force. Pat dry using gauze sponges, not tissue or cotton balls. Prim Dressing: Hydrofera Blue Ready Transfer Foam, 4x5 (in/in) (DME) (Generic) 3 x Per Week/15 Days ary Discharge Instructions: Apply Hydrofera Blue Ready to wound bed as directed Secondary Dressing: ABD Pad 5x9 (in/in) 3 x Per Week/15 Days Discharge Instructions: Cover with ABD pad Secondary Dressing:  (BORDER) Zetuvit Plus SILICONE BORDER Dressing 5x5 (in/in) 3 x Per Week/15 Days Discharge Instructions: Please do not put silicone bordered dressings under wraps. Use non-bordered dressing only. Secured With: Medipore T - 12M Medipore H Soft Cloth Surgical T ape ape, 2x2 (in/yd) 3 x Per Week/15 Days Secured With: [employer] or Non-Sterile 6-ply 4.5x4 (yd/yd) 3 x Per Week/15 Days Discharge Instructions: Apply Kerlix as directed Electronic Signature(s) Signed: 09/10/2023 4:09:48 PM By: Demetria Pore Signed: 09/10/2023 4:14:58 PM By: Angelina Pih Signed: 09/12/2023 7:54:12 PM By: Allen Derry PA-C Entered By: Angelina Pih on 09/10/2023 12:39:03 -------------------------------------------------------------------------------- Problem List Details Patient Name: Date of Service: [name] Broach E. 09/10/2023 9:45 A M Medical Record Number: [id number] Patient Account Number: [id number] Date of Birth/Sex: Treating RN: 02-19-1941 (82 y.o. [name] Primary Care Provider: Einar Crow Other Clinician: Referring Provider: Treating Provider/Extender: Johny Shears in Treatment: 2 [name], [name] ([id number]) [id number] Page 5 of 8 Active Problems ICD-10 Encounter Code Description Active Date MDM Diagnosis S81.811A Laceration without foreign body, right lower leg, initial encounter 08/23/2023 No Yes E11.622 Type 2 diabetes mellitus with other skin ulcer 08/23/2023 No Yes L97.812 Non-pressure chronic ulcer of other part of right lower leg with fat layer 08/23/2023 No Yes exposed I10 Essential (primary) hypertension 08/23/2023 No Yes J44.89 Other specified chronic obstructive pulmonary disease 08/23/2023 No Yes N18.4 Chronic kidney disease, stage 4 (severe) 08/23/2023 No Yes I48.0 Paroxysmal atrial fibrillation 08/23/2023 No Yes Z79.01 Long term (current) use of anticoagulants 08/23/2023 No Yes Inactive Problems Resolved  Problems Electronic Signature(s) Signed: 09/10/2023 10:02:00 AM By: Allen Derry PA-C Entered By: Allen Derry on 09/10/2023 10:02:00 -------------------------------------------------------------------------------- Progress Note Details Patient Name: Date of Service: [name], [name] Rector E. 09/10/2023 9:45 A M Medical Record Number: [id number] Patient Account Number: [id number] Date of Birth/Sex: Treating RN: 06/18/1941 (82 y.o. [name] Primary Care Provider: Einar Crow Other Clinician: Referring Provider: Treating Provider/Extender: Johny Shears in Treatment: 2 Subjective Chief Complaint Information obtained from Patient Right LE Ulcer [name], [name] ([id number]) [phone].pdf Page 6 of 8 History of Present Illness (HPI) 08-23-2023 upon evaluation today patient presents for initial inspection here in our clinic due to an issue that she has been having with a skin tear of the right lateral lower extremity where  she tells me that a storm door was going back into her leg causing a significant skin tear. With her leg hurting she unfortunately noted around July 23, 2023 that she was having a regular heart rate where it was jumping up going back down and it was so erratic and irregular she ended up calling an ambulance to take her to the hospital this ended up with a hospital stent that went from the July 23, 2023 through August 06, 2023. During the course of that hospital stay on 08-02-2023 she had an OR debridement it sounds like she may have had a significant hematoma based on what I am seeing and hearing. Following that time she has been using some Xeroform here and there and that has helped to some degree right now she tells me she has not been using that as she did not have any more of that at this point. She did have an x-ray on 07-30-2023 which was negative for any signs of fracture or acute bony abnormality. Patient does  have a history of diabetes mellitus type 2, hypertension, COPD, chronic kidney disease stage IV, atrial fibrillation, long-term use of anticoagulant therapy. 08-27-2023 upon evaluation today patient appears to be doing well currently in regard to her wound. This is actually showing signs of good improvement she had a lot of bleeding last week after debridement this was necessary but nonetheless she tells me that it really did bleed a lot. She was able to finally get it stop she did get her supplies she was very appreciative of this as well. Fortunately I do not see any signs of active infection locally or systemically at this time. 09-10-2023 upon evaluation today patient appears to be doing better in regard to her wound from a depth perspective although there is some need for debridement here today. I discussed that with her she had quite a bit of hypergranulation and slough buildup. She actually did allow for debridement and I was able to clean away the necrotic debris quite well without complications. Objective Constitutional Well-nourished and well-hydrated in no acute distress. Vitals Time Taken: 10:10 AM, Height: 62 in, Weight: 160 lbs, BMI: 29.3, Temperature: 97.7 F, Pulse: 71 bpm, Respiratory Rate: 20 breaths/min, Blood Pressure: 182/66 mmHg. General Notes: pt asymptomatic, pt states have not taken meds yet today Respiratory normal breathing without difficulty. Psychiatric this patient is able to make decisions and demonstrates good insight into disease process. Alert and Oriented x 3. pleasant and cooperative. General Notes: Patient's wound bed did require sharp debridement I removed slough and biofilm as well as hypergranulation tissue down to good subcutaneous tissue and she tolerated this today without complication. Postdebridement the wound bed is significantly improved I am very pleased with that I did use some silver nitrate for some areas that were bleeding. Integumentary (Hair,  Skin) Wound #1 status is Open. Original cause of wound was Skin T ear/Laceration. The date acquired was: 08/21/2023. The wound has been in treatment 2 weeks. The wound is located on the Right,Lateral Lower Leg. The wound measures 5.3cm length x 5.5cm width x 0.2cm depth; 22.894cm^2 area and 4.579cm^3 volume. There is Fat Layer (Subcutaneous Tissue) exposed. There is no tunneling or undermining noted. There is a medium amount of serosanguineous drainage noted. There is small (1-33%) red, hyper - granulation within the wound bed. There is a large (67-100%) amount of necrotic tissue within the wound bed including Adherent Slough. Assessment Active Problems ICD-10 Laceration without foreign body, right lower leg, initial encounter  Type 2 diabetes mellitus with other skin ulcer Non-pressure chronic ulcer of other part of right lower leg with fat layer exposed Essential (primary) hypertension Other specified chronic obstructive pulmonary disease Chronic kidney disease, stage 4 (severe) Paroxysmal atrial fibrillation Long term (current) use of anticoagulants Procedures Wound #1 Pre-procedure diagnosis of Wound #1 is a Skin T located on the Right,Lateral Lower Leg . There was a Excisional Skin/Subcutaneous Tissue Debridement ear with a total area of 22.88 sq cm performed by Allen Derry, PA-C. With the following instrument(s): Curette to remove Viable and Non-Viable tissue/material. Material removed includes Subcutaneous Tissue, Slough, and Hyper-granulation after achieving pain control using Lidocaine 4% T opical Solution. No specimens [name], [name] ([id number]) [phone].pdf Page 7 of 8 were taken. A time out was conducted at 10:38, prior to the start of the procedure. A Moderate amount of bleeding was controlled with Silver Nitrate. The procedure was tolerated well. Post Debridement Measurements: 5.3cm length x 5.5cm width x 0.3cm depth; 6.868cm^3 volume. Character of  Wound/Ulcer Post Debridement is stable. Post procedure Diagnosis Wound #1: Same as Pre-Procedure General Notes: 1 stick used. Plan Follow-up Appointments: Return Appointment in 1 week. Bathing/ Shower/ Hygiene: May shower; gently cleanse wound with antibacterial soap, rinse and pat dry prior to dressing wounds - Use Dial original GOLD soap to clean wound after showering No tub bath. Anesthetic (Use 'Patient Medications' Section for Anesthetic Order Entry): Lidocaine applied to wound bed Edema Control - Orders / Instructions: Elevate, Exercise Daily and Avoid Standing for Long Periods of Time. Elevate legs to the level of the heart and pump ankles as often as possible Elevate leg(s) parallel to the floor when sitting. DO YOUR BEST to sleep in the bed at night. DO NOT sleep in your recliner. Long hours of sitting in a recliner leads to swelling of the legs and/or potential wounds on your backside. WOUND #1: - Lower Leg Wound Laterality: Right, Lateral Cleanser: Byram Ancillary Kit - 15 Day Supply (Generic) 3 x Per Week/15 Days Discharge Instructions: Use supplies as instructed; Kit contains: (15) Saline Bullets; (15) 3x3 Gauze; 15 pr Gloves Cleanser: Wound Cleanser 3 x Per Week/15 Days Discharge Instructions: Wash your hands with soap and water. Remove old dressing, discard into plastic bag and place into trash. Cleanse the wound with Wound Cleanser prior to applying a clean dressing using gauze sponges, not tissues or cotton balls. Do not scrub or use excessive force. Pat dry using gauze sponges, not tissue or cotton balls. Prim Dressing: Hydrofera Blue Ready Transfer Foam, 4x5 (in/in) (DME) (Generic) 3 x Per Week/15 Days ary Discharge Instructions: Apply Hydrofera Blue Ready to wound bed as directed Secondary Dressing: ABD Pad 5x9 (in/in) 3 x Per Week/15 Days Discharge Instructions: Cover with ABD pad Secondary Dressing: (BORDER) Zetuvit Plus SILICONE BORDER Dressing 5x5 (in/in) 3 x Per  Week/15 Days Discharge Instructions: Please do not put silicone bordered dressings under wraps. Use non-bordered dressing only. Secured With: Medipore T - 64M Medipore H Soft Cloth Surgical T ape ape, 2x2 (in/yd) 3 x Per Week/15 Days Secured With: [employer] or Non-Sterile 6-ply 4.5x4 (yd/yd) 3 x Per Week/15 Days Discharge Instructions: Apply Kerlix as directed 1. I would recommend based on what we are seeing that we have the patient going to continue to monitor for any signs of worsening or infection. 2. I am also can recommend the patient should continue to monitor for any signs of infection or worsening. Based on what I see I do believe she is  infection free at the moment and I am hopeful the [facility] [place] is good to keep this in a much better controlled as far as the hypergranulation is concerned. We will see patient back for reevaluation in 1 week here in the clinic. If anything worsens or changes patient will contact our office for additional recommendations. Electronic Signature(s) Signed: 09/10/2023 5:40:25 PM By: Allen Derry PA-C Entered By: Allen Derry on 09/10/2023 17:40:25 -------------------------------------------------------------------------------- SuperBill Details Patient Name: Date of Service: [name], [name] E. 09/10/2023 Medical Record Number: [id number] Patient Account Number: [id number] Date of Birth/Sex: Treating RN: 12/16/1940 (82 y.o. [name] Primary Care Provider: Einar Crow Other Clinician: Referring Provider: Treating Provider/Extender: Johny Shears in Treatment: 2 Diagnosis Coding [name], [place] E ([id number]) [id number] Page 8 of 8 ICD-10 Codes Code Description [phone] Laceration without foreign body, right lower leg, initial encounter E11.622 Type 2 diabetes mellitus with other skin ulcer L97.812 Non-pressure chronic ulcer of other part of right lower leg with fat layer  exposed I10 Essential (primary) hypertension J44.89 Other specified chronic obstructive pulmonary disease N18.4 Chronic kidney disease, stage 4 (severe) I48.0 Paroxysmal atrial fibrillation Z79.01 Long term (current) use of anticoagulants Facility Procedures : CPT4 Code: [id number] Description: 11042 - DEB SUBQ TISSUE 20 SQ CM/< ICD-10 Diagnosis Description L97.812 Non-pressure chronic ulcer of other part of right lower leg with fat layer exp Modifier: osed Quantity: 1 : CPT4 Code: [id number] Description: 11045 - DEB SUBQ TISS EA ADDL 20CM ICD-10 Diagnosis Description L97.812 Non-pressure chronic ulcer of other part of right lower leg with fat layer exp Modifier: osed Quantity: 1 Physician Procedures : CPT4 Code Description Modifier 11042 11042 - WC PHYS SUBQ TISS 20 SQ CM ICD-10 Diagnosis Description L97.812 Non-pressure chronic ulcer of other part of right lower leg with fat layer exposed Quantity: 1 : [id number] 11045 - WC PHYS SUBQ TISS EA ADDL 20 CM ICD-10 Diagnosis Description L97.812 Non-pressure chronic ulcer of other part of right lower leg with fat layer exposed Quantity: 1 Electronic Signature(s) Signed: 09/10/2023 5:40:46 PM By: Allen Derry PA-C Entered By: Allen Derry on 09/10/2023 17:40:46

## 2023-09-11 ENCOUNTER — Ambulatory Visit: Payer: Medicare Other | Admitting: Physician Assistant

## 2023-09-12 ENCOUNTER — Encounter: Payer: Self-pay | Admitting: Surgery

## 2023-09-12 ENCOUNTER — Ambulatory Visit: Payer: Medicare Other | Admitting: Surgery

## 2023-09-12 VITALS — BP 116/52 | HR 51 | Temp 98.0°F | Ht 62.0 in | Wt 148.2 lb

## 2023-09-12 DIAGNOSIS — Z09 Encounter for follow-up examination after completed treatment for conditions other than malignant neoplasm: Secondary | ICD-10-CM

## 2023-09-12 DIAGNOSIS — S8011XD Contusion of right lower leg, subsequent encounter: Secondary | ICD-10-CM

## 2023-09-12 NOTE — Progress Notes (Signed)
[name] Is 2 months out from evacuation of right leg hematoma.  She is doing very well.  She is going to a wound care.  No fevers no chills  PE NAD  Measures 5 x 4 cm good granulation tissue.  I removed a purple foam.  I Replace it with Telfa and roll gauze, no infection   A/P Doing well Daily dressing changes RTC prn

## 2023-09-12 NOTE — Patient Instructions (Signed)
Hematoma A hematoma is a collection of blood. A hematoma can happen: Under the skin. In an organ. In a body space. In a joint space. In other tissues. The blood can thicken (clot) to form a lump that you can see and feel. The lump is often hard and may become sore and tender. The lump can be very small or very big. Most hematomas get better in a few days to weeks. However, some of these may be serious and need medical care. What are the causes? This condition is caused by: An injury. Blood that leaks under the skin. Problems from surgeries. Medical conditions that cause bleeding or bruising. What increases the risk? You are more likely to develop this condition if: You are an older adult. You use medicines that thin your blood. You use NSAIDs, such as ibuprofen, often for pain. You play contact sports. What are the signs or symptoms? Symptoms depend on where the hematoma is in your body. If the hematoma is under the skin, there is: A firm lump on the body. Pain and tenderness in the area. Bruising. The skin above the lump may be blue, dark blue, purple-red, or yellowish. If the hematoma is deep in the tissues or body spaces, there may be: Blood in the stomach. This may cause pain in the belly (abdomen), weakness, passing out (fainting), and shortness of breath. Blood in the head. This may cause a headache, weakness, trouble speaking or understanding speech, or passing out. How is this treated? Treatment depends on the cause, size, and location of the hematoma. Treatment may include: Doing nothing. Many hematomas go away on their own without treatment. Surgery or close monitoring. This may be needed for large hematomas or hematomas that affect the body's organs. Medicines. These may be given if a medical condition caused the hematoma. Follow these instructions at home: Managing pain, stiffness, and swelling  If told, put ice on the injured area. To do this: Put ice in a plastic  bag. Place a towel between your skin and the bag. Leave the ice on for 20 minutes, 2-3 times a day for the first two days. If your skin turns bright red, take off the ice right away to prevent skin damage. The risk of skin damage is higher if you cannot feel pain, heat, or cold. If told, put heat on the injured area. Do this as often as told by your doctor. Use the heat source that your doctor recommends, such as a moist heat pack or a heating pad. Place a towel between your skin and the heat source. Leave the heat on for 20-30 minutes. If your skin turns bright red, take off the heat right away to prevent burns. The risk of burns is higher if you cannot feel pain, heat, or cold. Raise the injured area above the level of your heart while you are sitting or lying down. Wrap the affected area with an elastic bandage, if told by your doctor. Do not wrap the bandage too tight. If your hematoma is on a leg or foot and is painful, your doctor may give you crutches. Use them as told by your doctor. General instructions Take over-the-counter and prescription medicines only as told by your doctor. Keep all follow-up visits. Your doctor may want to see how your hematoma is healing with treatment. Contact a doctor if: You have a fever. The swelling or bruising gets worse. You start to get more hematomas. Your pain gets worse. Your pain is not getting better  with medicine. The skin over the hematoma breaks or starts to bleed. Get help right away if: Your hematoma is in your chest or belly and you: Pass out. Feel weak. Become short of breath. You have a hematoma on your scalp that is caused by a fall or injury, and you: Have a headache that gets worse. Have trouble speaking or understanding speech. Become less alert or you pass out. These symptoms may be an emergency. Get help right away. Call 911. Do not wait to see if the symptoms will go away. Do not drive yourself to the hospital This  information is not intended to replace advice given to you by your health care provider. Make sure you discuss any questions you have with your health care provider. Document Revised: 03/13/2022 Document Reviewed: 03/13/2022 [employer] Patient Education  2024 [employer].

## 2023-09-17 ENCOUNTER — Encounter: Payer: Medicare Other | Admitting: Physician Assistant

## 2023-09-17 DIAGNOSIS — S81811A Laceration without foreign body, right lower leg, initial encounter: Secondary | ICD-10-CM | POA: Diagnosis not present

## 2023-09-17 NOTE — Progress Notes (Addendum)
[name], [name] ([id number]) [id number] Page 1 of 8 Visit Report for 09/17/2023 Chief Complaint Document Details Patient Name: Date of Service: [name], [name] [place] 09/17/2023 10:45 A M Medical Record Number: [id number] Patient Account Number: [id number] Date of Birth/Sex: Treating RN: 07/31/1941 (82 y.o. Freddy Finner Primary Care Provider: Einar Crow Other Clinician: Betha Loa Referring Provider: Treating Provider/Extender: Johny Shears in Treatment: 3 Information Obtained from: Patient Chief Complaint Right LE Ulcer Electronic Signature(s) Signed: 09/17/2023 10:59:11 AM By: Allen Derry PA-C Entered By: Allen Derry on 09/17/2023 10:59:11 -------------------------------------------------------------------------------- Debridement Details Patient Name: Date of Service: [name]. 09/17/2023 10:45 A M Medical Record Number: [id number] Patient Account Number: [id number] Date of Birth/Sex: Treating RN: 1941-04-06 (82 y.o. Freddy Finner Primary Care Provider: Einar Crow Other Clinician: Betha Loa Referring Provider: Treating Provider/Extender: Johny Shears in Treatment: 3 Debridement Performed for Assessment: Wound #1 Right,Lateral Lower Leg Performed By: Physician Allen Derry, PA-C Debridement Type: Debridement Level of Consciousness (Pre-procedure): Awake and Alert Pre-procedure Verification/Time Out Yes - 11:24 Taken: Start Time: 11:24 Percent of Wound Bed Debrided: 100% T Area Debrided (cm): otal 15.19 Tissue and other material debrided: Viable, Non-Viable, Slough, Subcutaneous, Biofilm, Slough, Hyper-granulation Level: Skin/Subcutaneous Tissue Debridement Description: Excisional Instrument: Curette Bleeding: Minimum Hemostasis Achieved: Pressure Response to Treatment: Procedure was tolerated well Level of Consciousness (Post- Awake and Alert procedure): [name], [name] ([id number]) [id number] Page 2 of 8 Post Debridement Measurements of Total Wound Length: (cm) 4.3 Width: (cm) 4.5 Depth: (cm) 0.1 Volume: (cm) 1.52 Character of Wound/Ulcer Post Debridement: Stable Post Procedure Diagnosis Same as Pre-procedure Electronic Signature(s) Signed: 09/17/2023 11:36:44 AM By: Allen Derry PA-C Signed: 09/17/2023 3:34:19 PM By: Yevonne Pax RN Entered By: Allen Derry on 09/17/2023 11:36:43 -------------------------------------------------------------------------------- HPI Details Patient Name: Date of Service: [name], [name]. 09/17/2023 10:45 A M Medical Record Number: [id number] Patient Account Number: [id number] Date of Birth/Sex: Treating RN: 07-16-1941 (82 y.o. Freddy Finner Primary Care Provider: Einar Crow Other Clinician: Betha Loa Referring Provider: Treating Provider/Extender: Johny Shears in Treatment: 3 History of Present Illness HPI Description: 08-23-2023 upon evaluation today patient presents for initial inspection here in our clinic due to an issue that she has been having with a skin tear of the right lateral lower extremity where she tells me that a storm door was going back into her leg causing a significant skin tear. With her leg hurting she unfortunately noted around July 23, 2023 that she was having a regular heart rate where it was jumping up going back down and it was so erratic and irregular she ended up calling an ambulance to take her to the hospital this ended up with a hospital stent that went from the July 23, 2023 through August 06, 2023. During the course of that hospital stay on 08-02-2023 she had an OR debridement it sounds like she may have had a significant hematoma based on what I am seeing and hearing. Following that time she has been using some Xeroform here and there and that has helped to some degree right now she tells me she has not been  using that as she did not have any more of that at this point. She did have an x-ray on 07-30-2023 which was negative for any signs of fracture or acute bony abnormality. Patient does have a history of diabetes mellitus type 2, hypertension, COPD, chronic kidney disease stage IV, atrial fibrillation, long-term use of anticoagulant therapy. 08-27-2023  upon evaluation today patient appears to be doing well currently in regard to her wound. This is actually showing signs of good improvement she had a lot of bleeding last week after debridement this was necessary but nonetheless she tells me that it really did bleed a lot. She was able to finally get it stop she did get her supplies she was very appreciative of this as well. Fortunately I do not see any signs of active infection locally or systemically at this time. 09-10-2023 upon evaluation today patient appears to be doing better in regard to her wound from a depth perspective although there is some need for debridement here today. I discussed that with her she had quite a bit of hypergranulation and slough buildup. She actually did allow for debridement and I was able to clean away the necrotic debris quite well without complications. 09-17-2023 upon evaluation today patient appears to be doing well currently in regard to her wound. She has been tolerating the dressing changes without complication. Fortunately I do not see any evidence of worsening overall and I believe that the patient is making good headway here towards closure which is great news. Electronic Signature(s) Signed: 09/17/2023 11:35:25 AM By: Allen Derry PA-C Entered By: Allen Derry on 09/17/2023 11:35:24 [name], [name] ([id number]) [id number] Page 3 of 8 -------------------------------------------------------------------------------- Physical Exam Details Patient Name: Date of Service: [name], [name] 09/17/2023 10:45 A M Medical Record Number:  [id number] Patient Account Number: [id number] Date of Birth/Sex: Treating RN: 05-Dec-1940 (82 y.o. Freddy Finner Primary Care Provider: Einar Crow Other Clinician: Betha Loa Referring Provider: Treating Provider/Extender: Johny Shears in Treatment: 3 Constitutional Well-nourished and well-hydrated in no acute distress. Respiratory normal breathing without difficulty. Psychiatric this patient is able to make decisions and demonstrates good insight into disease process. Alert and Oriented x 3. pleasant and cooperative. Notes Upon inspection patient's wound bed actually did require sharp debridement clearway necrotic debris she tolerated debridement today without complication and postdebridement wound bed appears to be doing much better which is great news there is still some hypergranulation remaining but again she bleeds very easily so I could not be too aggressive nonetheless I am hopeful that the [facility] will continue to should help this with improving and in general we have made great improvement thus far. Electronic Signature(s) Signed: 09/17/2023 11:35:35 AM By: Allen Derry PA-C Entered By: Allen Derry on 09/17/2023 11:35:35 -------------------------------------------------------------------------------- Physician Orders Details Patient Name: Date of Service: [name]. 09/17/2023 10:45 A M Medical Record Number: [id number] Patient Account Number: [id number] Date of Birth/Sex: Treating RN: 05-04-1941 (82 y.o. Freddy Finner Primary Care Provider: Einar Crow Other Clinician: Betha Loa Referring Provider: Treating Provider/Extender: Johny Shears in Treatment: 3 The following information was scribed by: Betha Loa The information was scribed for: Allen Derry Verbal / Phone Orders: No Diagnosis Coding ICD-10 Coding Code Description [phone] Laceration without foreign body, right lower  leg, initial encounter E11.622 Type 2 diabetes mellitus with other skin ulcer L97.812 Non-pressure chronic ulcer of other part of right lower leg with fat layer exposed I10 Essential (primary) hypertension [name], [name] ([id number]) [id number] Page 4 of 8 J44.89 Other specified chronic obstructive pulmonary disease N18.4 Chronic kidney disease, stage 4 (severe) I48.0 Paroxysmal atrial fibrillation Z79.01 Long term (current) use of anticoagulants Follow-up Appointments Return Appointment in 1 week. Bathing/ Shower/ Hygiene May shower; gently cleanse wound with antibacterial soap, rinse and pat dry prior to dressing wounds - Use  Dial original GOLD soap to clean wound after showering No tub bath. Anesthetic (Use 'Patient Medications' Section for Anesthetic Order Entry) Lidocaine applied to wound bed Edema Control - Orders / Instructions Elevate, Exercise Daily and A void Standing for Long Periods of Time. Elevate legs to the level of the heart and pump ankles as often as possible Elevate leg(s) parallel to the floor when sitting. DO YOUR BEST to sleep in the bed at night. DO NOT sleep in your recliner. Long hours of sitting in a recliner leads to swelling of the legs and/or potential wounds on your backside. Wound Treatment Wound #1 - Lower Leg Wound Laterality: Right, Lateral Cleanser: Byram Ancillary Kit - 15 Day Supply (Generic) 3 x Per Week/15 Days Discharge Instructions: Use supplies as instructed; Kit contains: (15) Saline Bullets; (15) 3x3 Gauze; 15 pr Gloves Cleanser: Wound Cleanser 3 x Per Week/15 Days Discharge Instructions: Wash your hands with soap and water. Remove old dressing, discard into plastic bag and place into trash. Cleanse the wound with Wound Cleanser prior to applying a clean dressing using gauze sponges, not tissues or cotton balls. Do not scrub or use excessive force. Pat dry using gauze sponges, not tissue or cotton balls. Prim  Dressing: Hydrofera Blue Ready Transfer Foam, 4x5 (in/in) (Generic) 3 x Per Week/15 Days ary Discharge Instructions: Apply Hydrofera Blue Ready to wound bed as directed Secondary Dressing: ABD Pad 5x9 (in/in) 3 x Per Week/15 Days Discharge Instructions: Cover with ABD pad Secondary Dressing: (BORDER) Zetuvit Plus SILICONE BORDER Dressing 5x5 (in/in) 3 x Per Week/15 Days Discharge Instructions: Please do not put silicone bordered dressings under wraps. Use non-bordered dressing only. Secured With: Medipore T - 16M Medipore H Soft Cloth Surgical T ape ape, 2x2 (in/yd) 3 x Per Week/15 Days Secured With: [employer] or Non-Sterile 6-ply 4.5x4 (yd/yd) 3 x Per Week/15 Days Discharge Instructions: Apply Kerlix as directed Electronic Signature(s) Signed: 09/17/2023 2:04:51 PM By: Betha Loa Signed: 09/17/2023 4:20:01 PM By: Allen Derry PA-C Entered By: Betha Loa on 09/17/2023 11:28:22 -------------------------------------------------------------------------------- Problem List Details Patient Name: Date of Service: [name]. 09/17/2023 10:45 A M Medical Record Number: [id number] Patient Account Number: [id number] Date of Birth/Sex: Treating RN: [birth year] (82 y.o. Freddy Finner Primary Care Provider: Einar Crow Other Clinician: Betha Loa Referring Provider: Treating Provider/Extender: Johny Shears in Treatment: 3 [name], [name] ([id number]) [id number] Page 5 of 8 Active Problems ICD-10 Encounter Code Description Active Date MDM Diagnosis S81.811A Laceration without foreign body, right lower leg, initial encounter 08/23/2023 No Yes E11.622 Type 2 diabetes mellitus with other skin ulcer 08/23/2023 No Yes L97.812 Non-pressure chronic ulcer of other part of right lower leg with fat layer 08/23/2023 No Yes exposed I10 Essential (primary) hypertension 08/23/2023 No Yes J44.89 Other specified chronic  obstructive pulmonary disease 08/23/2023 No Yes N18.4 Chronic kidney disease, stage 4 (severe) 08/23/2023 No Yes I48.0 Paroxysmal atrial fibrillation 08/23/2023 No Yes Z79.01 Long term (current) use of anticoagulants 08/23/2023 No Yes Inactive Problems Resolved Problems Electronic Signature(s) Signed: 09/17/2023 10:59:05 AM By: Allen Derry PA-C Entered By: Allen Derry on 09/17/2023 10:59:05 -------------------------------------------------------------------------------- Progress Note Details Patient Name: Date of Service: [name], [name]. 09/17/2023 10:45 A M Medical Record Number: [id number] Patient Account Number: [id number] Date of Birth/Sex: Treating RN: 1940-12-10 (82 y.o. Freddy Finner Primary Care Provider: Einar Crow Other Clinician: Betha Loa Referring Provider: Treating Provider/Extender: Johny Shears in Treatment: 3 Subjective Chief Complaint Information obtained from Patient Right  LE Ulcer JAYDALEE, HEERY ([id number]) [id number] Page 6 of 8 History of Present Illness (HPI) 08-23-2023 upon evaluation today patient presents for initial inspection here in our clinic due to an issue that she has been having with a skin tear of the right lateral lower extremity where she tells me that a storm door was going back into her leg causing a significant skin tear. With her leg hurting she unfortunately noted around July 23, 2023 that she was having a regular heart rate where it was jumping up going back down and it was so erratic and irregular she ended up calling an ambulance to take her to the hospital this ended up with a hospital stent that went from the July 23, 2023 through August 06, 2023. During the course of that hospital stay on 08-02-2023 she had an OR debridement it sounds like she may have had a significant hematoma based on what I am seeing and hearing. Following that time she has been using some  Xeroform here and there and that has helped to some degree right now she tells me she has not been using that as she did not have any more of that at this point. She did have an x-ray on 07-30-2023 which was negative for any signs of fracture or acute bony abnormality. Patient does have a history of diabetes mellitus type 2, hypertension, COPD, chronic kidney disease stage IV, atrial fibrillation, long-term use of anticoagulant therapy. 08-27-2023 upon evaluation today patient appears to be doing well currently in regard to her wound. This is actually showing signs of good improvement she had a lot of bleeding last week after debridement this was necessary but nonetheless she tells me that it really did bleed a lot. She was able to finally get it stop she did get her supplies she was very appreciative of this as well. Fortunately I do not see any signs of active infection locally or systemically at this time. 09-10-2023 upon evaluation today patient appears to be doing better in regard to her wound from a depth perspective although there is some need for debridement here today. I discussed that with her she had quite a bit of hypergranulation and slough buildup. She actually did allow for debridement and I was able to clean away the necrotic debris quite well without complications. 09-17-2023 upon evaluation today patient appears to be doing well currently in regard to her wound. She has been tolerating the dressing changes without complication. Fortunately I do not see any evidence of worsening overall and I believe that the patient is making good headway here towards closure which is great news. Objective Constitutional Well-nourished and well-hydrated in no acute distress. Vitals Time Taken: 10:50 AM, Height: 62 in, Weight: 160 lbs, BMI: 29.3, Temperature: 97.4 F, Pulse: 53 bpm, Respiratory Rate: 18 breaths/min, Blood Pressure: 128/53 mmHg. Respiratory normal breathing without  difficulty. Psychiatric this patient is able to make decisions and demonstrates good insight into disease process. Alert and Oriented x 3. pleasant and cooperative. General Notes: Upon inspection patient's wound bed actually did require sharp debridement clearway necrotic debris she tolerated debridement today without complication and postdebridement wound bed appears to be doing much better which is great news there is still some hypergranulation remaining but again she bleeds very easily so I could not be too aggressive nonetheless I am hopeful that the [facility] will continue to should help this with improving and in general we have made great improvement thus far. Integumentary (Hair, Skin) Wound #1  status is Open. Original cause of wound was Skin T ear/Laceration. The date acquired was: 08/21/2023. The wound has been in treatment 3 weeks. The wound is located on the Right,Lateral Lower Leg. The wound measures 4.3cm length x 4.5cm width x 0.1cm depth; 15.197cm^2 area and 1.52cm^3 volume. There is Fat Layer (Subcutaneous Tissue) exposed. There is a medium amount of serosanguineous drainage noted. There is small (1-33%) red, hyper - granulation within the wound bed. There is a large (67-100%) amount of necrotic tissue within the wound bed including Adherent Slough. Assessment Active Problems ICD-10 Laceration without foreign body, right lower leg, initial encounter Type 2 diabetes mellitus with other skin ulcer Non-pressure chronic ulcer of other part of right lower leg with fat layer exposed Essential (primary) hypertension Other specified chronic obstructive pulmonary disease Chronic kidney disease, stage 4 (severe) Paroxysmal atrial fibrillation Long term (current) use of anticoagulants Procedures Wound #1 Pre-procedure diagnosis of Wound #1 is a Skin T located on the Right,Lateral Lower Leg . There was a Excisional Skin/Subcutaneous Tissue Debridement ear [name], [name]  ([id number]) [id number] Page 7 of 8 with a total area of 15.19 sq cm performed by Allen Derry, PA-C. With the following instrument(s): Curette to remove Viable and Non-Viable tissue/material. Material removed includes Subcutaneous Tissue, Slough, Biofilm, and Hyper-granulation. A time out was conducted at 11:24, prior to the start of the procedure. A Minimum amount of bleeding was controlled with Pressure. The procedure was tolerated well. Post Debridement Measurements: 4.3cm length x 4.5cm width x 0.1cm depth; 1.52cm^3 volume. Character of Wound/Ulcer Post Debridement is stable. Post procedure Diagnosis Wound #1: Same as Pre-Procedure Plan Follow-up Appointments: Return Appointment in 1 week. Bathing/ Shower/ Hygiene: May shower; gently cleanse wound with antibacterial soap, rinse and pat dry prior to dressing wounds - Use Dial original GOLD soap to clean wound after showering No tub bath. Anesthetic (Use 'Patient Medications' Section for Anesthetic Order Entry): Lidocaine applied to wound bed Edema Control - Orders / Instructions: Elevate, Exercise Daily and Avoid Standing for Long Periods of Time. Elevate legs to the level of the heart and pump ankles as often as possible Elevate leg(s) parallel to the floor when sitting. DO YOUR BEST to sleep in the bed at night. DO NOT sleep in your recliner. Long hours of sitting in a recliner leads to swelling of the legs and/or potential wounds on your backside. WOUND #1: - Lower Leg Wound Laterality: Right, Lateral Cleanser: Byram Ancillary Kit - 15 Day Supply (Generic) 3 x Per Week/15 Days Discharge Instructions: Use supplies as instructed; Kit contains: (15) Saline Bullets; (15) 3x3 Gauze; 15 pr Gloves Cleanser: Wound Cleanser 3 x Per Week/15 Days Discharge Instructions: Wash your hands with soap and water. Remove old dressing, discard into plastic bag and place into trash. Cleanse the wound with Wound Cleanser prior  to applying a clean dressing using gauze sponges, not tissues or cotton balls. Do not scrub or use excessive force. Pat dry using gauze sponges, not tissue or cotton balls. Prim Dressing: Hydrofera Blue Ready Transfer Foam, 4x5 (in/in) (Generic) 3 x Per Week/15 Days ary Discharge Instructions: Apply Hydrofera Blue Ready to wound bed as directed Secondary Dressing: ABD Pad 5x9 (in/in) 3 x Per Week/15 Days Discharge Instructions: Cover with ABD pad Secondary Dressing: (BORDER) Zetuvit Plus SILICONE BORDER Dressing 5x5 (in/in) 3 x Per Week/15 Days Discharge Instructions: Please do not put silicone bordered dressings under wraps. Use non-bordered dressing only. Secured With: Medipore T - 32M Medipore H Soft Cloth Surgical T  ape ape, 2x2 (in/yd) 3 x Per Week/15 Days Secured With: [employer] or Non-Sterile 6-ply 4.5x4 (yd/yd) 3 x Per Week/15 Days Discharge Instructions: Apply Kerlix as directed 1. I would recommend based on what we are seeing that we go ahead and have the patient continue to monitor for any signs of infection or worsening. I am going to suggest specifically that we go ahead and get things moving along with the [facility] I think this is doing a good job we will continue with the debridements and keep the wound clean monitor for any signs of infection. 2. I am also going to recommend the patient should continue to use the bordered foam dressing to cover. We will see patient back for reevaluation in 1 week here in the clinic. If anything worsens or changes patient will contact our office for additional recommendations. Electronic Signature(s) Signed: 09/17/2023 11:38:06 AM By: Allen Derry PA-C Previous Signature: 09/17/2023 11:36:05 AM Version By: Allen Derry PA-C Entered By: Allen Derry on 09/17/2023 11:38:06 -------------------------------------------------------------------------------- SuperBill Details Patient Name: Date of Service: [name], [name].  09/17/2023 Medical Record Number: [id number] Patient Account Number: [id number] Date of Birth/Sex: Treating RN: 1940/11/08 (82 y.o. Freddy Finner Primary Care Provider: Einar Crow Other Clinician: Betha Loa Referring Provider: Treating Provider/Extender: Johny Shears in Treatment: 3 [name], [name] ([id number]) [id number] Page 8 of 8 Diagnosis Coding ICD-10 Codes Code Description [phone] Laceration without foreign body, right lower leg, initial encounter E11.622 Type 2 diabetes mellitus with other skin ulcer L97.812 Non-pressure chronic ulcer of other part of right lower leg with fat layer exposed I10 Essential (primary) hypertension J44.89 Other specified chronic obstructive pulmonary disease N18.4 Chronic kidney disease, stage 4 (severe) I48.0 Paroxysmal atrial fibrillation Z79.01 Long term (current) use of anticoagulants Facility Procedures : CPT4 Code: [id number] Description: 11042 - DEB SUBQ TISSUE 20 SQ CM/< ICD-10 Diagnosis Description L97.812 Non-pressure chronic ulcer of other part of right lower leg with fat layer exp Modifier: osed Quantity: 1 Physician Procedures : CPT4 Code Description Modifier 11042 11042 - WC PHYS SUBQ TISS 20 SQ CM ICD-10 Diagnosis Description L97.812 Non-pressure chronic ulcer of other part of right lower leg with fat layer exposed Quantity: 1 Electronic Signature(s) Signed: 09/17/2023 11:38:14 AM By: Allen Derry PA-C Entered By: Allen Derry on 09/17/2023 11:38:14

## 2023-09-17 NOTE — Progress Notes (Signed)
[name], [name] ([id number]) [id number] Page 1 of 8 Visit Report for 09/17/2023 Arrival Information Details Patient Name: Date of Service: [name], [name] 09/17/2023 10:45 A M Medical Record Number: [id number] Patient Account Number: [id number] Date of Birth/Sex: Treating RN: [birth year] (82 y.o. [name] Primary Care [name]: Einar Crow Other Clinician: Betha Loa Referring Rashan Patient: Treating [name] Visit Information History Since Last Visit Added or deleted any medications: No Patient Arrived: Wheel Chair Any new allergies or adverse reactions: No Arrival Time: 10:49 Had a fall or experienced change in No Accompanied By: family activities of daily living that may affect Transfer Assistance: None risk of falls: Patient Identification Verified: Yes Hospitalized since last visit: No Secondary Verification Process Completed: Yes Has Dressing in Place as Prescribed: Yes Patient Has Alerts: Yes Pain Present Now: No Patient Alerts: Patient on Blood Thinner Fistula LUE type 2 diabetic Electronic Signature(s) Signed: 09/17/2023 2:04:51 PM By: Betha Loa Entered By: Betha Loa on 09/17/2023 10:59:19 -------------------------------------------------------------------------------- Clinic Level of Care Assessment Details Patient Name: Date of Service: [name] 09/17/2023 10:45 A M Medical Record Number: [id number] Patient Account Number: [id number] Date of Birth/Sex: Treating RN: 1941-03-03 (82 y.o. [name] Primary Care Yvetta Drotar: Einar Crow Other Clinician: Betha Loa Referring [name]: Treating [name]/Extender: [name] in [name]: [name] Clinic Level of Care Assessment Items TOOL 1 Quantity Score []  - 0 Use when EandM and Procedure is performed on INITIAL visit ASSESSMENTS - Nursing Assessment /  Reassessment []  - 0 General Physical Exam (combine w/ comprehensive assessment (listed just below) when performed on new pt. evals) []  - 0 Comprehensive Assessment (HX, ROS, Risk Assessments, Wounds Hx, etc.) ASSESSMENTS - Wound and Skin Assessment / Reassessment []  - 0 Dermatologic / Skin Assessment (not related to wound area) [name], [name] ([id number]) [id number] Page 2 of 8 ASSESSMENTS - Ostomy and/or Continence Assessment and Care []  - 0 Incontinence Assessment and Management []  - 0 Ostomy Care Assessment and Management (repouching, etc.) PROCESS - Coordination of Care []  - 0 Simple Patient / Family Education for ongoing care []  - 0 Complex (extensive) Patient / Family Education for ongoing care []  - 0 Staff obtains Chiropractor, Records, T Results / Process Orders est []  - 0 Staff telephones HHA, Nursing Homes / Clarify orders / etc []  - 0 Routine Transfer to another Facility (non-emergent condition) []  - 0 Routine Hospital Admission (non-emergent condition) []  - 0 New Admissions / Manufacturing engineer / Ordering NPWT Apligraf, etc. , []  - 0 Emergency Hospital Admission (emergent condition) PROCESS - Special Needs []  - 0 Pediatric / Minor Patient Management []  - 0 Isolation Patient Management []  - 0 Hearing / Language / Visual special needs []  - 0 Assessment of Community assistance (transportation, D/C planning, etc.) []  - 0 Additional assistance / Altered mentation []  - 0 Support Surface(s) Assessment (bed, cushion, seat, etc.) INTERVENTIONS - Miscellaneous []  - 0 External ear exam []  - 0 Patient Transfer (multiple staff / Nurse, adult / Similar devices) []  - 0 Simple Staple / Suture removal (25 or less) []  - 0 Complex Staple / Suture removal (26 or more) []  - 0 Hypo/Hyperglycemic Management (do not check if billed separately) []  - 0 Ankle / Brachial Index (ABI) - do not check if billed separately Has the patient been seen at  the hospital within the last three years: Yes Total Score: 0 Level Of Care: ____ Electronic Signature(s) Signed: 09/17/2023 2:04:51 PM By:  Betha Loa Entered By: Betha Loa on 09/17/2023 11:28:33 -------------------------------------------------------------------------------- Encounter Discharge Information Details Patient Name: Date of Service: [name], [name] 09/17/2023 10:45 A M Medical Record Number: [id number] Patient Account Number: [id number] Date of Birth/Sex: Treating RN: Feb 18, 1941 (82 y.o. [name] Primary Care Tyeshia Cornforth: Einar Crow Other Clinician: Betha Loa Referring Nataleigh Griffin: Treating [name]/Extender: [name] in [name]: [name] Encounter Discharge Information Items Post Procedure [name] ([id number]) [id number] Page [name] of 8 Discharge Condition: Stable Temperature (F): 97.4 Ambulatory Status: Wheelchair Pulse (bpm): 53 Discharge Destination: Home Respiratory Rate (breaths/min): 18 Transportation: Private Auto Blood Pressure (mmHg): 128/53 Accompanied By: spouse Schedule Follow-up Appointment: Yes Clinical Summary of Care: Electronic Signature(s) Signed: 09/17/2023 2:04:51 PM By: Betha Loa Entered By: Betha Loa on 09/17/2023 11:38:52 -------------------------------------------------------------------------------- Lower Extremity Assessment Details Patient Name: Date of Service: [name], [name] 09/17/2023 10:45 A M Medical Record Number: [id number] Patient Account Number: [id number] Date of Birth/Sex: Treating RN: Oct 11, 1940 (82 y.o. [name] Primary Care Jurgen Groeneveld: Einar Crow Other Clinician: Betha Loa Referring Neilah Fulwider: Treating Zakaria Sedor/Extender: [name] in [name]: [name] Electronic Signature(s) Signed: 09/17/2023 2:04:51 PM By: Betha Loa Signed: 09/17/2023 [name]:34:19 PM By: Yevonne Pax RN Entered By:  Betha Loa on 09/17/2023 11:05:13 -------------------------------------------------------------------------------- Multi Wound Chart Details Patient Name: Date of Service: [name] Broach E. 09/17/2023 10:45 A M Medical Record Number: [id number] Patient Account Number: [id number] Date of Birth/Sex: Treating RN: 1941/08/06 (82 y.o. [name] Primary Care Rilen Shukla: Einar Crow Other Clinician: Betha Loa Referring Eleny Cortez: Treating Lendy Dittrich/Extender: [name] in [name]: [name] Vital Signs Height(in): 62 Pulse(bpm): 53 Weight(lbs): 160 Blood Pressure(mmHg): 128/53 Body Mass Index(BMI): 29.[name] Temperature(F): 97.4 Respiratory Rate(breaths/min): 18 [1:Photos:] [N/A:N/A] Right, Lateral Lower Leg N/A N/A Wound Location: Skin Tear/Laceration N/A N/A Wounding Event: Skin Tear N/A N/A Primary Etiology: Anemia, Asthma, Chronic Obstructive N/A N/A Comorbid History: Pulmonary Disease (COPD), Sleep Apnea, Arrhythmia, Congestive Heart Failure, Hypertension, Type II Diabetes, End Stage Renal Disease, Gout 08/21/2023 N/A N/A Date Acquired: [name] N/A N/A Weeks of [name]: Open N/A N/A Wound Status: No N/A N/A Wound Recurrence: 4.3x4.5x0.1 N/A N/A Measurements L x W x D (cm) 15.197 N/A N/A A (cm) : rea 1.52 N/A N/A Volume (cm) : 48.80% N/A N/A % Reduction in Area: 89.80% N/A N/A % Reduction in Volume: Full Thickness Without Exposed N/A N/A Classification: Support Structures Medium N/A N/A Exudate A mount: Serosanguineous N/A N/A Exudate Type: red, brown N/A N/A Exudate Color: Small (1-33%) N/A N/A Granulation A mount: Red, Hyper-granulation N/A N/A Granulation Quality: Large (67-100%) N/A N/A Necrotic A mount: Fat Layer (Subcutaneous Tissue): Yes N/A N/A Exposed Structures: None N/A N/A Epithelialization: [name] Notes Electronic Signature(s) Signed: 09/17/2023 2:04:51 PM By: Betha Loa Entered By: Betha Loa  on 09/17/2023 11:05:17 -------------------------------------------------------------------------------- Multi-Disciplinary Care Plan Details Patient Name: Date of Service: [name] Broach E. 09/17/2023 10:45 A M Medical Record Number: [id number] Patient Account Number: [id number] Date of Birth/Sex: Treating RN: 05-17-1941 (82 y.o. [name] Primary Care Deryck Hippler: Einar Crow Other Clinician: Betha Loa Referring Theresa Dohrman: Treating Cem Kosman/Extender: [name] in [name]: [name] Active Inactive Necrotic Tissue Nursing Diagnoses: Impaired tissue integrity related to necrotic/devitalized tissue Knowledge deficit related to management of necrotic/devitalized tissue Goals: Necrotic/devitalized tissue will be minimized in the wound bed Date Initiated: 08/23/2023 Target Resolution Date: 10/18/2023 [name], [name] ([id number]) [id number] Page 5 of 8 Goal Status: Active Patient/caregiver will verbalize understanding of reason and process for debridement of  necrotic tissue Date Initiated: 08/23/2023 Date Inactivated: 08/23/2023 Target Resolution Date: 08/23/2023 Goal Status: Met Interventions: Assess patient pain level pre-, during and post procedure and prior to discharge Provide education on necrotic tissue and debridement process [name] Activities: Apply topical anesthetic as ordered : 08/23/2023 Excisional debridement : 08/23/2023 Notes: Wound/Skin Impairment Nursing Diagnoses: Impaired tissue integrity Knowledge deficit related to ulceration/compromised skin integrity Goals: Ulcer/skin breakdown will have a volume reduction of 30% by week 4 Date Initiated: 08/23/2023 Target Resolution Date: 09/20/2023 Goal Status: Active Ulcer/skin breakdown will have a volume reduction of 50% by week 8 Date Initiated: 08/23/2023 Target Resolution Date: 10/18/2023 Goal Status: Active Ulcer/skin breakdown will have a volume  reduction of 80% by week 12 Date Initiated: 08/23/2023 Target Resolution Date: 11/15/2023 Goal Status: Active Ulcer/skin breakdown will heal within 14 weeks Date Initiated: 08/23/2023 Target Resolution Date: 11/29/2023 Goal Status: Active Interventions: Assess patient/caregiver ability to obtain necessary supplies Assess patient/caregiver ability to perform ulcer/skin care regimen upon admission and as needed Assess ulceration(s) every visit Provide education on ulcer and skin care Notes: Electronic Signature(s) Signed: 09/17/2023 2:04:51 PM By: Betha Loa Signed: 09/17/2023 [name]:34:19 PM By: Yevonne Pax RN Entered By: Betha Loa on 09/17/2023 11:28:43 -------------------------------------------------------------------------------- Pain Assessment Details Patient Name: Date of Service: [name] Broach E. 09/17/2023 10:45 A M Medical Record Number: [id number] Patient Account Number: [id number] Date of Birth/Sex: Treating RN: 02/22/1941 (82 y.o. [name] Primary Care Aybree Lanyon: Einar Crow Other Clinician: Betha Loa Referring Travontae Freiberger: Treating Jennalynn Rivard/Extender: [name] in [name]: [name] Active Problems Location of Pain Severity and Description of Pain Patient Has Paino No Site Locations [place], [place] E ([id number]) [id number] Page 6 of 8 Pain Management and Medication Current Pain Management: Electronic Signature(s) Signed: 09/17/2023 2:04:51 PM By: Betha Loa Signed: 09/17/2023 4:17:39 PM By: Angelina Pih Entered By: Betha Loa on 09/17/2023 10:59:27 -------------------------------------------------------------------------------- Patient/Caregiver Education Details Patient Name: Date of Service: [name] 12/16/2024andnbsp10:45 A M Medical Record Number: [id number] Patient Account Number: [id number] Date of Birth/Gender: Treating RN: 1941-04-01 (82 y.o. [name] Primary  Care Physician: Einar Crow Other Clinician: Betha Loa Referring Physician: Treating Physician/Extender: [name] in [name]: [name] Education Assessment Education Provided To: Patient Education Topics Provided Wound/Skin Impairment: Handouts: Other: continue wound care as directed Methods: Explain/Verbal Responses: State content correctly Electronic Signature(s) Signed: 09/17/2023 2:04:51 PM By: Betha Loa Entered By: Betha Loa on 09/17/2023 11:29:37 Mehta, Alben Spittle ([id number]) [id number] Page 7 of 8 -------------------------------------------------------------------------------- Wound Assessment Details Patient Name: Date of Service: [name], [name] 09/17/2023 10:45 A M Medical Record Number: [id number] Patient Account Number: [id number] Date of Birth/Sex: Treating RN: Feb 23, 1941 (82 y.o. [name] Primary Care Lyna Laningham: Einar Crow Other Clinician: Betha Loa Referring Arval Brandstetter: Treating Mariette Cowley/Extender: [name] in [name]: [name] Wound Status Wound Number: 1 Primary Skin T ear Etiology: Wound Location: Right, Lateral Lower Leg Wound Open Wounding Event: Skin Tear/Laceration Status: Date Acquired: 08/21/2023 Comorbid Anemia, Asthma, Chronic Obstructive Pulmonary Disease (COPD), Weeks Of [name]: [name] History: Sleep Apnea, Arrhythmia, Congestive Heart Failure, Hypertension, Clustered Wound: No Type II Diabetes, End Stage Renal Disease, Gout Photos Wound Measurements Length: (cm) 4.[name] Width: (cm) 4.5 Depth: (cm) 0.1 Area: (cm) 15.197 Volume: (cm) 1.52 % Reduction in Area: 48.8% % Reduction in Volume: 89.8% Epithelialization: None Wound Description Classification: Full Thickness Without Exposed Suppor Exudate Amount: Medium Exudate Type: Serosanguineous Exudate Color: red, brown t Structures Foul Odor After Cleansing: No Slough/Fibrino  Yes Wound Bed Granulation  Amount: Small (1-33%) Exposed Structure Granulation Quality: Red, Hyper-granulation Fat Layer (Subcutaneous Tissue) Exposed: Yes Necrotic Amount: Large (67-100%) Necrotic Quality: Adherent Slough [name] Notes Wound #1 (Lower Leg) Wound Laterality: Right, Lateral Cleanser Peri-Wound Care Topical Primary Dressing Secondary Dressing [name], [name] E ([id number]) [id number] Page 8 of 8 Secured With Compression Wrap Compression Stockings Facilities manager) Signed: 09/17/2023 2:04:51 PM By: Betha Loa Signed: 09/17/2023 [name]:34:19 PM By: Yevonne Pax RN Entered By: Betha Loa on 09/17/2023 11:05:03 -------------------------------------------------------------------------------- Vitals Details Patient Name: Date of Service: [name], [name] Rector E. 09/17/2023 10:45 A M Medical Record Number: [id number] Patient Account Number: [id number] Date of Birth/Sex: Treating RN: April 21, 1941 (82 y.o. [name] Primary Care Quinto Tippy: Einar Crow Other Clinician: Betha Loa Referring Julann Mcgilvray: Treating Posey Petrik/Extender: [name] in [name]: [name] Vital Signs Time Taken: 10:50 Temperature (F): 97.4 Height (in): 62 Pulse (bpm): 53 Weight (lbs): 160 Respiratory Rate (breaths/min): 18 Body Mass Index (BMI): 29.[name] Blood Pressure (mmHg): 128/53 Reference Range: 80 - 120 mg / dl Electronic Signature(s) Signed: 09/17/2023 2:04:51 PM By: Betha Loa Entered By: Betha Loa on 09/17/2023 10:59:23

## 2023-09-19 ENCOUNTER — Ambulatory Visit: Payer: Medicare Other | Attending: Otolaryngology

## 2023-09-19 DIAGNOSIS — I4891 Unspecified atrial fibrillation: Secondary | ICD-10-CM | POA: Insufficient documentation

## 2023-09-19 DIAGNOSIS — J4489 Other specified chronic obstructive pulmonary disease: Secondary | ICD-10-CM | POA: Insufficient documentation

## 2023-09-19 DIAGNOSIS — R519 Headache, unspecified: Secondary | ICD-10-CM | POA: Insufficient documentation

## 2023-09-19 DIAGNOSIS — R0683 Snoring: Secondary | ICD-10-CM | POA: Insufficient documentation

## 2023-09-19 DIAGNOSIS — E119 Type 2 diabetes mellitus without complications: Secondary | ICD-10-CM | POA: Insufficient documentation

## 2023-09-19 DIAGNOSIS — I10 Essential (primary) hypertension: Secondary | ICD-10-CM | POA: Insufficient documentation

## 2023-09-19 DIAGNOSIS — G4733 Obstructive sleep apnea (adult) (pediatric): Secondary | ICD-10-CM | POA: Diagnosis not present

## 2023-09-19 DIAGNOSIS — I471 Supraventricular tachycardia, unspecified: Secondary | ICD-10-CM | POA: Diagnosis not present

## 2023-09-21 ENCOUNTER — Other Ambulatory Visit: Payer: Self-pay | Admitting: *Deleted

## 2023-09-21 DIAGNOSIS — D631 Anemia in chronic kidney disease: Secondary | ICD-10-CM

## 2023-09-22 ENCOUNTER — Telehealth (INDEPENDENT_AMBULATORY_CARE_PROVIDER_SITE_OTHER): Payer: Medicare Other | Admitting: Pulmonary Disease

## 2023-09-22 DIAGNOSIS — G4733 Obstructive sleep apnea (adult) (pediatric): Secondary | ICD-10-CM

## 2023-09-22 NOTE — Telephone Encounter (Signed)
Mild OSA AHI only 6 but RDI 30, sig desats OK to continue CPAP

## 2023-09-24 ENCOUNTER — Inpatient Hospital Stay (HOSPITAL_BASED_OUTPATIENT_CLINIC_OR_DEPARTMENT_OTHER): Payer: Medicare Other | Admitting: Internal Medicine

## 2023-09-24 ENCOUNTER — Encounter: Payer: Self-pay | Admitting: Internal Medicine

## 2023-09-24 ENCOUNTER — Inpatient Hospital Stay: Payer: Medicare Other

## 2023-09-24 ENCOUNTER — Ambulatory Visit: Payer: Medicare Other | Admitting: Physician Assistant

## 2023-09-24 ENCOUNTER — Inpatient Hospital Stay: Payer: Medicare Other | Attending: Internal Medicine

## 2023-09-24 VITALS — BP 114/49 | HR 64 | Temp 97.3°F | Resp 16 | Wt 155.0 lb

## 2023-09-24 DIAGNOSIS — N3000 Acute cystitis without hematuria: Secondary | ICD-10-CM

## 2023-09-24 DIAGNOSIS — E1122 Type 2 diabetes mellitus with diabetic chronic kidney disease: Secondary | ICD-10-CM | POA: Insufficient documentation

## 2023-09-24 DIAGNOSIS — D631 Anemia in chronic kidney disease: Secondary | ICD-10-CM | POA: Insufficient documentation

## 2023-09-24 DIAGNOSIS — N184 Chronic kidney disease, stage 4 (severe): Secondary | ICD-10-CM | POA: Insufficient documentation

## 2023-09-24 DIAGNOSIS — D649 Anemia, unspecified: Secondary | ICD-10-CM | POA: Diagnosis not present

## 2023-09-24 DIAGNOSIS — I130 Hypertensive heart and chronic kidney disease with heart failure and stage 1 through stage 4 chronic kidney disease, or unspecified chronic kidney disease: Secondary | ICD-10-CM | POA: Insufficient documentation

## 2023-09-24 DIAGNOSIS — N390 Urinary tract infection, site not specified: Secondary | ICD-10-CM | POA: Insufficient documentation

## 2023-09-24 DIAGNOSIS — R300 Dysuria: Secondary | ICD-10-CM | POA: Diagnosis not present

## 2023-09-24 DIAGNOSIS — R399 Unspecified symptoms and signs involving the genitourinary system: Secondary | ICD-10-CM | POA: Diagnosis not present

## 2023-09-24 LAB — CBC WITH DIFFERENTIAL/PLATELET
Abs Immature Granulocytes: 0.01 10*3/uL (ref 0.00–0.07)
Basophils Absolute: 0 10*3/uL (ref 0.0–0.1)
Basophils Relative: 0 %
Eosinophils Absolute: 0 10*3/uL (ref 0.0–0.5)
Eosinophils Relative: 0 %
HCT: 32.8 % — ABNORMAL LOW (ref 36.0–46.0)
Hemoglobin: 10.3 g/dL — ABNORMAL LOW (ref 12.0–15.0)
Immature Granulocytes: 0 %
Lymphocytes Relative: 28 %
Lymphs Abs: 2.1 10*3/uL (ref 0.7–4.0)
MCH: 28.2 pg (ref 26.0–34.0)
MCHC: 31.4 g/dL (ref 30.0–36.0)
MCV: 89.9 fL (ref 80.0–100.0)
Monocytes Absolute: 0.4 10*3/uL (ref 0.1–1.0)
Monocytes Relative: 5 %
Neutro Abs: 5.2 10*3/uL (ref 1.7–7.7)
Neutrophils Relative %: 67 %
Platelets: 162 10*3/uL (ref 150–400)
RBC: 3.65 MIL/uL — ABNORMAL LOW (ref 3.87–5.11)
RDW: 16.2 % — ABNORMAL HIGH (ref 11.5–15.5)
WBC: 7.8 10*3/uL (ref 4.0–10.5)
nRBC: 0 % (ref 0.0–0.2)

## 2023-09-24 LAB — URINALYSIS, COMPLETE (UACMP) WITH MICROSCOPIC
Bilirubin Urine: NEGATIVE
Glucose, UA: NEGATIVE mg/dL
Ketones, ur: NEGATIVE mg/dL
Nitrite: NEGATIVE
Protein, ur: 100 mg/dL — AB
Specific Gravity, Urine: 1.012 (ref 1.005–1.030)
WBC, UA: >50 WBC/hpf (ref 0–5)
pH: 5 (ref 5.0–8.0)

## 2023-09-24 MED ORDER — NITROFURANTOIN MONOHYD MACRO 100 MG PO CAPS: 100.0000 mg | ORAL_CAPSULE | Freq: Two times a day (BID) | ORAL | 0 refills | Status: AC

## 2023-09-24 NOTE — Progress Notes (Signed)
Patient states starting yesterday urinating more frequent with some burning, and not urinating a lot each time. I gave the patient a urine cup just in case, plus the granddaughter really wants patient to have a urinalysis done.

## 2023-09-24 NOTE — Progress Notes (Signed)
[facility]  [phone] [phone]   ID: Debralee Winiarski Yaney OB: [birth year]  MR#: [id number]  RKY#:[id number]  Patient Care Team: Lauro Regulus, MD as PCP - General (Internal Medicine) Michaelyn Barter, MD as Consulting Physician (Oncology)  REFERRING PROVIDER: Dr. Cherylann Ratel  REASON FOR REFERRAL: anemia of CKD  HPI: [name] is a 82 y.o. female with past medical history of anemia, skin cancer, CHF, diabetes, GERD, hypertension, hyperlipidemia, hypothyroidism, CKD stage IV, OSA, osteoporosis referred to hematology for management of normocytic anemia.  Patient was seen today accompanied by her niece.  She follows with Dr. Cherylann Ratel.  Has history of CKD stage IV which has been attributed to her chronic hypertension and prior diabetes.  SPEP/IFE checked in 2022 was negative.  Reports feeling fatigue, shortness of breath on exertion and chest pain for a year now which has been progressively worse.  Hemoglobin from November 2024 was 7.3.  Started on Aranesp 200 mcg on 08/28/2023.  Hemoglobin today has improved to 10.3 just with the 1 injection.  Interval history Patient was seen today with niece for labs and Aranesp. She reports feeling significantly better after 1 injection of Aranesp.  Her energy has improved.  She is able to drive which she has not done for the past 6 to 8 months.  Complaining of urinary frequency and dysuria.  REVIEW OF SYSTEMS:   Review of Systems  Constitutional:  Positive for malaise/fatigue.    As per HPI. Otherwise, a complete review of systems is negative.  PAST MEDICAL HISTORY: Past Medical History:  Diagnosis Date   Actinic keratosis    Albuminuria    Anemia    Arthritis    Asthma    Basal cell carcinoma 04/12/2017   Above right lateral brow. Nodulocystic type. EDC   Cancer (HCC)    skin   Cataract cortical, senile    CHF (congestive heart failure) (HCC)    Diabetes mellitus without complication (HCC)     GERD (gastroesophageal reflux disease)    Hemorrhoids    History of kidney stones    Hyperlipidemia    Hypertension    Hypothyroidism    Lyme disease    No kidney function    OSA (obstructive sleep apnea)    Osteoporosis    Osteoporosis    Reflux esophagitis    Steatohepatitis    Steatohepatitis     PAST SURGICAL HISTORY: Past Surgical History:  Procedure Laterality Date   ABDOMINAL HYSTERECTOMY     APPENDECTOMY     AV FISTULA PLACEMENT Left 05/19/2022   Procedure: ARTERIOVENOUS (AV) FISTULA CREATION ( BRACHIAL CEPHALIC );  Surgeon: Renford Dills, MD;  Location: [facility] ORS;  Service: Vascular;  Laterality: Left;   CARDIAC CATHETERIZATION  1980   [facility]   CARDIAC CATHETERIZATION  08/13/2014   [facility]. no significant CAD, normal LVEDP.    CATARACT EXTRACTION     CHOLECYSTECTOMY     COLONOSCOPY     COLONOSCOPY WITH PROPOFOL N/A 12/07/2016   Procedure: COLONOSCOPY WITH PROPOFOL;  Surgeon: Christena Deem, MD;  Location: [facility] ENDOSCOPY;  Service: Endoscopy;  Laterality: N/A;   ESOPHAGOGASTRODUODENOSCOPY     ESOPHAGOGASTRODUODENOSCOPY (EGD) WITH PROPOFOL N/A 12/07/2016   Procedure: ESOPHAGOGASTRODUODENOSCOPY (EGD) WITH PROPOFOL;  Surgeon: Christena Deem, MD;  Location: [facility] ENDOSCOPY;  Service: Endoscopy;  Laterality: N/A;   ESOPHAGOGASTRODUODENOSCOPY (EGD) WITH PROPOFOL N/A 01/07/2018   Procedure: ESOPHAGOGASTRODUODENOSCOPY (EGD) WITH PROPOFOL;  Surgeon: Christena Deem, MD;  Location: [facility] ENDOSCOPY;  Service: Endoscopy;  Laterality: N/A;   EYE SURGERY     HEMATOMA EVACUATION Right 08/02/2023   Procedure: EVACUATION HEMATOMA;  Surgeon: Leafy Ro, MD;  Location: [facility] ORS;  Service: General;  Laterality: Right;   HEMORRHOID SURGERY     PARTIAL HYSTERECTOMY     THYROIDECTOMY N/A 08/25/2019   Procedure: THYROIDECTOMY EXTRACTION OF SUBTOTAL COMPONENT; PARATHYROID AUTOTRANSPLANT X1;  Surgeon: Duanne Guess, MD;  Location: [facility] ORS;  Service: General;  Laterality: N/A;  With  Nerve Monitoring(RLN)   TONSILLECTOMY      FAMILY HISTORY: Family History  Problem Relation Age of Onset   Breast cancer Mother    Heart attack Father     HEALTH MAINTENANCE: Social History   Tobacco Use   Smoking status: Never   Smokeless tobacco: Never  Vaping Use   Vaping status: Never Used  Substance Use Topics   Alcohol use: No   Drug use: No     Allergies  Allergen Reactions   Ace Inhibitors     Other reaction(s): Unknown   Egg-Derived Products Diarrhea   Other     Other reaction(s): Other (See Comments) Eggs   Prednisone     Other reaction(s): Other (See Comments) joint pain   Risedronate     Other reaction(s): Other (See Comments)   Sulfa Antibiotics Itching and Swelling    Other reaction(s): Other (See Comments)   Sulfasalazine Other (See Comments)    Current Outpatient Medications  Medication Sig Dispense Refill   nitrofurantoin, macrocrystal-monohydrate, (MACROBID) 100 MG capsule Take 1 capsule (100 mg total) by mouth 2 (two) times daily for 5 days. 10 capsule 0   acetaminophen (TYLENOL) 500 MG tablet Take 1,000 mg by mouth every 6 (six) hours as needed for mild pain or moderate pain.     albuterol (PROVENTIL HFA;VENTOLIN HFA) 108 (90 BASE) MCG/ACT inhaler Inhale 2 puffs into the lungs every 6 (six) hours as needed for wheezing or shortness of breath.     amLODipine (NORVASC) 5 MG tablet Take 1 tablet (5 mg total) by mouth daily.     apixaban (ELIQUIS) 2.5 MG TABS tablet Take 1 tablet (2.5 mg total) by mouth 2 (two) times daily. 60 tablet 11   calcium carbonate (TUMS - DOSED IN MG ELEMENTAL CALCIUM) 500 MG chewable tablet Chew 1-2 tablets (200-400 mg of elemental calcium total) by mouth 3 (three) times daily as needed for indigestion or heartburn.     esomeprazole (NEXIUM) 40 MG capsule Take 40 mg by mouth daily before breakfast.     fluticasone (FLONASE) 50 MCG/ACT nasal spray Place 2 sprays into both nostrils daily.     fluticasone-salmeterol (ADVAIR)  500-50 MCG/ACT AEPB Inhale 1 puff into the lungs in the morning and at bedtime.     furosemide (LASIX) 40 MG tablet Take 1 tablet (40 mg total) by mouth 2 (two) times daily. 60 tablet 2   hydrALAZINE (APRESOLINE) 100 MG tablet Take 1 tablet (100 mg total) by mouth 3 (three) times daily. 270 tablet 1   ipratropium-albuterol (DUONEB) 0.5-2.5 (3) MG/3ML SOLN Take 3 mLs by nebulization every 6 (six) hours as needed (SOB). 360 mL 0   levothyroxine (SYNTHROID) 150 MCG tablet Take 150 mcg by mouth daily.     lidocaine (LIDODERM) 5 % Place 1 patch onto the skin every 12 (twelve) hours. Remove & Discard patch within 12 hours or as directed by MD 15 patch 0   loperamide (IMODIUM) 2 MG capsule Take 1 capsule (2 mg total) by mouth as needed  for diarrhea or loose stools. 30 capsule 0   metoprolol tartrate (LOPRESSOR) 50 MG tablet Take 25 mg by mouth 2 (two) times daily.     montelukast (SINGULAIR) 10 MG tablet Take 10 mg by mouth at bedtime.     Multiple Minerals-Vitamins (CALCIUM & VIT D3 BONE HEALTH PO) Take 1 tablet by mouth daily. 600 mg/ 25 mg     polyethylene glycol (MIRALAX / GLYCOLAX) 17 g packet Take 17 g by mouth daily as needed for mild constipation or moderate constipation.     potassium chloride (KLOR-CON) 10 MEQ tablet Take 10 mEq by mouth daily.     predniSONE (DELTASONE) 10 MG tablet 1 tablet daily 5 tablet 0   rosuvastatin (CRESTOR) 40 MG tablet Take 40 mg by mouth daily.     senna-docusate (SENOKOT-S) 8.6-50 MG tablet Take 2 tablets by mouth at bedtime as needed for mild constipation.     sertraline (ZOLOFT) 100 MG tablet Take 100 mg by mouth daily.     No current facility-administered medications for this visit.    OBJECTIVE: Vitals:   09/24/23 1016  BP: (!) 114/49  Pulse: 64  Resp: 16  Temp: (!) 97.3 F (36.3 C)  SpO2: 98%      Body mass index is 28.35 kg/m.      Physical exam not performed. Virtual visit.    LAB RESULTS:  Lab Results  Component Value Date   NA 136  08/06/2023   K 4.9 08/06/2023   CL 102 08/06/2023   CO2 24 08/06/2023   GLUCOSE 88 08/06/2023   BUN 79 (H) 08/06/2023   CREATININE 3.26 (H) 08/06/2023   CALCIUM 9.4 08/06/2023   PROT 6.3 (L) 08/05/2023   ALBUMIN 2.7 (L) 08/05/2023   AST 28 08/05/2023   ALT 25 08/05/2023   ALKPHOS 75 08/05/2023   BILITOT 0.4 08/05/2023   GFRNONAA 14 (L) 08/06/2023   GFRAA >60 12/31/2016    Lab Results  Component Value Date   WBC 7.8 09/24/2023   NEUTROABS 5.2 09/24/2023   HGB 10.3 (L) 09/24/2023   HCT 32.8 (L) 09/24/2023   MCV 89.9 09/24/2023   PLT 162 09/24/2023    Lab Results  Component Value Date   TIBC 372 08/14/2023   TIBC 288 06/03/2023   FERRITIN 91 08/14/2023   FERRITIN 97 06/15/2022   FERRITIN 106 06/14/2022   IRONPCTSAT 19 08/14/2023   IRONPCTSAT 12 06/03/2023     STUDIES: DG Chest 2 View Result Date: 09/07/2023 CLINICAL DATA:  cough EXAM: CHEST - 2 VIEW COMPARISON:  CXR 07/30/23 FINDINGS: Cardiomegaly. No pleural effusion. No pneumothorax. Prominent bilateral interstitial opacities could represent pulmonary venous congestion. No radiographically apparent displaced rib fractures. Visualized upper abdomen is unremarkable. There is exaggerated thoracic kyphosis. Vertebral body heights are maintained. IMPRESSION: Cardiomegaly with prominent bilateral interstitial opacities, which could represent pulmonary venous congestion. Electronically Signed   By: Lorenza Cambridge M.D.   On: 09/07/2023 13:59    ASSESSMENT AND PLAN:   [name] is a 82 y.o. female with pmh of anemia, skin cancer, CHF, diabetes, GERD, hypertension, hyperlipidemia, hypothyroidism, CKD stage IV, OSA, osteoporosis referred to hematology for management of normocytic anemia.  # Normocytic anemia # CKD stage IV -Likely secondary to CKD.  Has history of CKD stage IV which has been attributed to her chronic hypertension and prior diabetes.  SPEP/IFE checked in 2022 was negative.  -Iron panel/b12/folate normal.  SPEP/IFE no M protein. Kappa/lambda proportionately elevated likely due to CKD. EPO 23  low for the degree of anemia.  -Pretreatment Hb 7.3.  Started on Aranesp 200 mcg on 08/28/2023.  4 weeks later her hemoglobin has improved to 10.3.  I will hold Aranesp today due to robust response with 1 dose.  She is feeling significantly better in terms of the energy.  Follow-up in 4 weeks with labs and possible Aranesp.    # UTI -Patient complaining of urinary frequency and dysuria. -Collect UA and urine culture.  Sent prescription for Macrobid 100 mg twice daily for 5 days.  Will inform the patient if antibiotic needs to be changed based on culture results.  Orders Placed This Encounter  Procedures   Urine Culture   Urinalysis, Complete w Microscopic   CBC with Differential/Platelet   RTC in 4 weeks for MD visit, labs, Aranesp  Patient expressed understanding and was in agreement with this plan. She also understands that She can call clinic at any time with any questions, concerns, or complaints.   I spent a total of 25 minutes reviewing chart data, face-to-face evaluation with the patient, counseling and coordination of care as detailed above.  Michaelyn Barter, MD   09/24/2023 3:27 PM

## 2023-09-24 NOTE — Telephone Encounter (Signed)
Spoke to patient. She is aware of results and voiced her understanding. She stated that she has a cpap and she will continue to use it, however I do not see record of our office originally prescribing cpap.   Dr. Belia Heman, do you want to adjust pressure?

## 2023-09-26 LAB — URINE CULTURE: Culture: >=100000 — AB

## 2023-09-27 ENCOUNTER — Telehealth: Payer: Self-pay

## 2023-09-27 ENCOUNTER — Other Ambulatory Visit: Payer: Self-pay | Admitting: *Deleted

## 2023-09-27 MED ORDER — CEFPODOXIME PROXETIL 100 MG PO TABS: 100.0000 mg | ORAL_TABLET | Freq: Two times a day (BID) | ORAL | 0 refills | Status: DC

## 2023-09-27 NOTE — Telephone Encounter (Signed)
I called and left patient a message on her answering machine stating that Dr. Alena Bills wanted to switch her antibiotic to another antibiotic. I told the patient how much and how often she should be taking the medication.

## 2023-10-01 ENCOUNTER — Telehealth: Payer: Self-pay | Admitting: *Deleted

## 2023-10-01 ENCOUNTER — Encounter: Payer: Self-pay | Admitting: Internal Medicine

## 2023-10-01 NOTE — Telephone Encounter (Signed)
Niece called to see what info for the call. Agarawal had given atb on 12/23 and then when the final results came in she wanted to change it to Vantin 100 mg and take it twice a day.  Only vantin ,She can call if any other questions

## 2023-10-02 ENCOUNTER — Encounter (INDEPENDENT_AMBULATORY_CARE_PROVIDER_SITE_OTHER): Payer: Self-pay

## 2023-10-02 ENCOUNTER — Ambulatory Visit (INDEPENDENT_AMBULATORY_CARE_PROVIDER_SITE_OTHER): Payer: Medicare Other | Admitting: Otolaryngology

## 2023-10-02 VITALS — BP 178/46 | HR 82 | Resp 19 | Ht 62.0 in | Wt 155.0 lb

## 2023-10-02 DIAGNOSIS — B9689 Other specified bacterial agents as the cause of diseases classified elsewhere: Secondary | ICD-10-CM

## 2023-10-02 DIAGNOSIS — R490 Dysphonia: Secondary | ICD-10-CM | POA: Diagnosis not present

## 2023-10-02 DIAGNOSIS — R0982 Postnasal drip: Secondary | ICD-10-CM | POA: Diagnosis not present

## 2023-10-02 DIAGNOSIS — R0981 Nasal congestion: Secondary | ICD-10-CM

## 2023-10-02 DIAGNOSIS — J343 Hypertrophy of nasal turbinates: Secondary | ICD-10-CM

## 2023-10-02 DIAGNOSIS — J309 Allergic rhinitis, unspecified: Secondary | ICD-10-CM

## 2023-10-02 DIAGNOSIS — J383 Other diseases of vocal cords: Secondary | ICD-10-CM

## 2023-10-02 DIAGNOSIS — J341 Cyst and mucocele of nose and nasal sinus: Secondary | ICD-10-CM

## 2023-10-02 MED ORDER — AZELASTINE HCL 0.1 % NA SOLN: 2.0000 | Freq: Two times a day (BID) | NASAL | 12 refills | Status: DC

## 2023-10-02 MED ORDER — FLUTICASONE PROPIONATE 50 MCG/ACT NA SUSP: 2.0000 | Freq: Every day | NASAL | 5 refills | Status: DC

## 2023-10-02 NOTE — Progress Notes (Signed)
 Dear Dr. Isaiah, Here is my assessment for our mutual patient, Brylynn Hanssen. Thank you for allowing me the opportunity to care for your patient. Please do not hesitate to contact me should you have any other questions. Sincerely, Dr. Eldora Blanch  Otolaryngology Clinic Note Referring provider: Dr. Isaiah HPI:  [name] is a 82 y.o. female kindly referred by Dr. Isaiah for evaluation of cough, dysphonia and congestion.  Initial visit (09/2023): Patient reports: she has had some dysphonia after thyroidectomy in Nov 2020 (Dr. Marolyn). Woke up with a hoarse voice, and it has improved but reports comes and goes. She reports it is not getting worse, but stable with intermittent worsening. She reports her main problem with voice projection. Never complete loss but some worsening with use. Coughing or throat clearing is not chronic and not a significant issue except for after her URI a few weeks ago. Patient otherwise denies: - dysphagia, odynophagia, aspiration episodes, need for Heimlich, unintentional weight loss - hemoptysis - ear pain, neck masses - Shortness of breath (new)  No significant voice use requirements, and overall, voice is not significantly bothersome - it is ok for her use currently. She has had to be intubated for a RT Leg injury in Oct 2024.   She reports that more recently, she has had cough and nasal congestion ongoing for about 3-4 weeks. Mucoid anterior rhinorrhea. She is having productive green mucus. Some decline in sense of smell. Some max soreness but no pressure. No frequent sinus infections requiring abx/steroids. Has had an ED visit for it, got azithromycin  and was diagnosed with bronchitis. Of note, she also was prescribed z-pack and pred burst in Nov 2024 for COPD exacerbation (presumed). No fevers. She does feel like things are slowly getting better but not all the way there. She is currently using astelin . No frequent sinus infections generally; no ocular sx including  double vision or decline.  She follows with pulm for OSA eval and bronchitis/COPD and takes advair daily. Dr. Isaiah Referred her for evaluation for this.  She is currently on Vantin  for UTI  H&N Surgery: Total thyroidectomy (2020) Personal or FHx of bleeding dz or anesthesia difficulty: no  GLP-1: no AP/AC: Eliquis   Tobacco: never.  PMHx: T2DM, CKD, HFpEF Post-op hypothyroidism, HTN, Aenima, Secondary hyperPTH, COPD/Bronchitis, OSA  Independent Review of Additional Tests or Records:  Dr. Isaiah (08/2023) Pulm - noted excessive sleepiness, morning headaches, h/o COPD on advair; cough for several weeks, recent intubation; hoarseness and cough with productive sputum; Dx: OSA, COPD, question VF dysfunction; Rx: advair, Z-pack, Pred burst; ref to ENT ED Notes (09/09/2023): for cough/congestion. 3 weeks, h/o lung infections; no fevers, SOB. CXR b/l interstitial opacities; c/f COPD exacerbation; Rx: zithromax  Dr. 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  PMH/Meds/All/SocHx/FamHx/ROS:   Past Medical History:  Diagnosis Date   Actinic keratosis    Albuminuria    Anemia    Arthritis    Asthma    Basal cell carcinoma 04/12/2017   Above right lateral brow. Nodulocystic type. EDC   Cancer (HCC)    skin   Cataract cortical, senile    CHF (congestive heart failure) (HCC)    Diabetes mellitus without complication (HCC)    GERD (gastroesophageal reflux disease)    Hemorrhoids    History of kidney stones  Hyperlipidemia    Hypertension    Hypothyroidism    Lyme disease    No kidney function    OSA (obstructive sleep apnea)    Osteoporosis    Osteoporosis    Reflux esophagitis    Steatohepatitis    Steatohepatitis      Past Surgical  History:  Procedure Laterality Date   ABDOMINAL HYSTERECTOMY     APPENDECTOMY     AV FISTULA PLACEMENT Left 05/19/2022   Procedure: ARTERIOVENOUS (AV) FISTULA CREATION ( BRACHIAL CEPHALIC );  Surgeon: Jama Cordella MATSU, MD;  Location: [facility] ORS;  Service: Vascular;  Laterality: Left;   CARDIAC CATHETERIZATION  1980   [facility]   CARDIAC CATHETERIZATION  08/13/2014   [facility]. no significant CAD, normal LVEDP.    CATARACT EXTRACTION     CHOLECYSTECTOMY     COLONOSCOPY     COLONOSCOPY WITH PROPOFOL  N/A 12/07/2016   Procedure: COLONOSCOPY WITH PROPOFOL ;  Surgeon: Gladis RAYMOND Mariner, MD;  Location: [facility] ENDOSCOPY;  Service: Endoscopy;  Laterality: N/A;   ESOPHAGOGASTRODUODENOSCOPY     ESOPHAGOGASTRODUODENOSCOPY (EGD) WITH PROPOFOL  N/A 12/07/2016   Procedure: ESOPHAGOGASTRODUODENOSCOPY (EGD) WITH PROPOFOL ;  Surgeon: Gladis RAYMOND Mariner, MD;  Location: [facility] ENDOSCOPY;  Service: Endoscopy;  Laterality: N/A;   ESOPHAGOGASTRODUODENOSCOPY (EGD) WITH PROPOFOL  N/A 01/07/2018   Procedure: ESOPHAGOGASTRODUODENOSCOPY (EGD) WITH PROPOFOL ;  Surgeon: Mariner Gladis RAYMOND, MD;  Location: [facility] ENDOSCOPY;  Service: Endoscopy;  Laterality: N/A;   EYE SURGERY     HEMATOMA EVACUATION Right 08/02/2023   Procedure: EVACUATION HEMATOMA;  Surgeon: Jordis Laneta FALCON, MD;  Location: [facility] ORS;  Service: General;  Laterality: Right;   HEMORRHOID SURGERY     PARTIAL HYSTERECTOMY     THYROIDECTOMY N/A 08/25/2019   Procedure: THYROIDECTOMY EXTRACTION OF SUBTOTAL COMPONENT; PARATHYROID  AUTOTRANSPLANT X1;  Surgeon: Marolyn Nest, MD;  Location: [facility] ORS;  Service: General;  Laterality: N/A;  With Nerve Monitoring(RLN)   TONSILLECTOMY      Family History  Problem Relation Age of Onset   Breast cancer Mother    Heart attack Father      Social Connections: Moderately Isolated (06/12/2023)   Social Connection and Isolation Panel [NHANES]    Frequency of Communication with Friends and Family: More than three times a week    Frequency of  Social Gatherings with Friends and Family: More than three times a week    Attends Religious Services: Never    Database Administrator or Organizations: No    Attends Engineer, Structural: Not on file    Marital Status: Married      Current Outpatient Medications:    acetaminophen  (TYLENOL ) 500 MG tablet, Take 1,000 mg by mouth every 6 (six) hours as needed for mild pain or moderate pain., Disp: , Rfl:    albuterol  (PROVENTIL  HFA;VENTOLIN  HFA) 108 (90 BASE) MCG/ACT inhaler, Inhale 2 puffs into the lungs every 6 (six) hours as needed for wheezing or shortness of breath., Disp: , Rfl:    allopurinol  (ZYLOPRIM ) 100 MG tablet, Take 100 mg by mouth daily., Disp: , Rfl:    amLODipine  (NORVASC ) 5 MG tablet, Take 1 tablet (5 mg total) by mouth daily., Disp: , Rfl:    apixaban  (ELIQUIS ) 2.5 MG TABS tablet, Take 1 tablet (2.5 mg total) by mouth 2 (two) times daily., Disp: 60 tablet, Rfl: 11   azelastine  (ASTELIN ) 0.1 % nasal spray, Place 2 sprays into both nostrils 2 (two) times daily. Use in each nostril as directed, Disp: 30 mL, Rfl: 12   calcium  carbonate (  TUMS - DOSED IN MG ELEMENTAL CALCIUM ) 500 MG chewable tablet, Chew 1-2 tablets (200-400 mg of elemental calcium  total) by mouth 3 (three) times daily as needed for indigestion or heartburn. (Patient not taking: Reported on 10/05/2023), Disp: , Rfl:    cefpodoxime  (VANTIN ) 100 MG tablet, Take 1 tablet (100 mg total) by mouth 2 (two) times daily., Disp: 14 tablet, Rfl: 0   esomeprazole  (NEXIUM ) 40 MG capsule, Take 40 mg by mouth daily before breakfast., Disp: , Rfl:    fluticasone -salmeterol (ADVAIR) 500-50 MCG/ACT AEPB, Inhale 1 puff into the lungs in the morning and at bedtime., Disp: , Rfl:    hydrALAZINE  (APRESOLINE ) 100 MG tablet, Take 1 tablet (100 mg total) by mouth 3 (three) times daily., Disp: 270 tablet, Rfl: 1   ipratropium-albuterol  (DUONEB) 0.5-2.5 (3) MG/3ML SOLN, Take 3 mLs by nebulization every 6 (six) hours as needed (SOB).,  Disp: 360 mL, Rfl: 0   levothyroxine  (SYNTHROID ) 150 MCG tablet, Take 150 mcg by mouth daily., Disp: , Rfl:    lidocaine  (LIDODERM ) 5 %, Place 1 patch onto the skin every 12 (twelve) hours. Remove & Discard patch within 12 hours or as directed by MD (Patient not taking: Reported on 10/05/2023), Disp: 15 patch, Rfl: 0   loperamide  (IMODIUM ) 2 MG capsule, Take 1 capsule (2 mg total) by mouth as needed for diarrhea or loose stools., Disp: 30 capsule, Rfl: 0   metoprolol  tartrate (LOPRESSOR ) 50 MG tablet, Take 25 mg by mouth 2 (two) times daily., Disp: , Rfl:    montelukast  (SINGULAIR ) 10 MG tablet, Take 10 mg by mouth at bedtime., Disp: , Rfl:    Multiple Minerals-Vitamins (CALCIUM  & VIT D3 BONE HEALTH PO), Take 1 tablet by mouth daily. 600 mg/ 25 mg, Disp: , Rfl:    polyethylene glycol (MIRALAX  / GLYCOLAX ) 17 g packet, Take 17 g by mouth daily as needed for mild constipation or moderate constipation. (Patient not taking: Reported on 10/05/2023), Disp: , Rfl:    potassium chloride  (KLOR-CON ) 10 MEQ tablet, Take 10 mEq by mouth daily., Disp: , Rfl:    senna-docusate (SENOKOT-S) 8.6-50 MG tablet, Take 2 tablets by mouth at bedtime as needed for mild constipation., Disp: , Rfl:    sertraline  (ZOLOFT ) 100 MG tablet, Take 100 mg by mouth daily., Disp: , Rfl:    fluticasone  (FLONASE ) 50 MCG/ACT nasal spray, Place 2 sprays into both nostrils daily., Disp: 11 mL, Rfl: 5   furosemide  (LASIX ) 40 MG tablet, Take 1 tablet (40 mg total) by mouth 2 (two) times daily., Disp: 60 tablet, Rfl: 4   rosuvastatin  (CRESTOR ) 40 MG tablet, Take 40 mg by mouth daily., Disp: , Rfl:    vitamin B-12 (CYANOCOBALAMIN ) 500 MCG tablet, Take 500 mcg by mouth daily., Disp: , Rfl:    Physical Exam:   BP (!) 178/46 (BP Location: Right Arm, Patient Position: Sitting) Comment: took again and now its 181/68 she took bp medication but is feeling under the weather  Pulse 82   Resp 19   Ht 5' 2 (1.575 m)   Wt 155 lb (70.3 kg)   SpO2 96%    BMI 28.35 kg/m   Salient findings:  CN II-XII intact  Bilateral EAC clear and TM intact with well pneumatized middle ear spaces Anterior rhinoscopy: Septum relatively midline; bilateral inferior turbinates with modest hypertrophy. Nasal endoscopy was indicated to better evaluate the nose and paranasal sinuses, given the patient's history and exam findings and prior mucocele history and symptoms of sinusitis, and  is detailed below. No lesions of oral cavity/oropharynx No obviously palpable neck masses/lymphadenopathy/thyromegaly No respiratory distress or stridor; Voice quality class 2.5 Neck incision well healed  Seprately Identifiable Procedures:  PROCEDURE: Bilateral Diagnostic Rigid Nasal Endoscopy Pre-procedure diagnosis: Concern for chronic sinusitis; left ethmoid mucocele Post-procedure diagnosis: same Indication: See pre-procedure diagnosis and physical exam above Complications: None apparent EBL: 0 mL Anesthesia: Lidocaine  4% and topical decongestant was topically sprayed in each nasal cavity  Description of Procedure:  Patient was identified. A rigid 30 degree endoscope was utilized to evaluate the sinonasal cavities, mucosa, sinus ostia and turbinates and septum.  Overall, signs of minimal mucosal inflammation are noted with modest amount of mucoid secretions along nasal floor and some minimal mucosal edema b/l.  No mucopurulence, polyps, or masses noted.   Right Middle meatus: clear Right SE Recess: clear Left MM: clear Left SE Recess: clear    Photodocumentation was obtained.  CPT CODE -- 68768 - Mod 25  Procedure Note Pre-procedure diagnosis:  Dysphonia, cough Post-procedure diagnosis: Same Procedure: Transnasal Fiberoptic Laryngoscopy, CPT 31575 - Mod 25 Indication: dysphonia, cough Complications: None apparent EBL: 0 mL  The procedure was undertaken to further evaluate the patient's complaint of dysphonia and cough, with mirror exam inadequate for appropriate  examination due to gag reflex and poor patient tolerance  Procedure:  Patient was identified as correct patient. Verbal consent was obtained. The nose was sprayed with oxymetazoline and 4% lidocaine . The The flexible laryngoscope was passed through the nose to view the nasal cavity, pharynx (oropharynx, hypopharynx) and larynx.  The larynx was examined at rest and during multiple phonatory tasks. Documentation was obtained and reviewed with patient. The scope was removed. The patient tolerated the procedure well.  Findings: The nasal cavity and nasopharynx did not reveal any masses or lesions, mucosa appeared to be without obvious lesions. The tongue base, pharyngeal walls, piriform sinuses, vallecula, epiglottis and postcricoid region are normal in appearance. The visualized portion of the subglottis and proximal trachea is widely patent. The vocal folds are mobile bilaterally. There are no lesions on the free edge of the vocal folds nor elsewhere in the larynx worrisome for malignancy but she does have a smal cystic or polypoid lesions on right. No obvious reactive lesion left today but unable to do a strobe due to clinic constraints. Bilateral vocal fold atrophy    Electronically signed by: Eldora KATHEE Blanch, MD 10/06/2023 12:12 PM   Impression & Plans:  [name] is a 82 y.o. female with multiple cormorbidities including COPD now with:  1. Lesion of vocal fold   2. Dysphonia   3. Vocal fold atrophy   4. Post-nasal drip   5. Mucocele of ethmoid sinus   6. Nasal congestion   7. Hypertrophy of both inferior nasal turbinates   8. Allergic rhinitis, unspecified seasonality, unspecified trigger   9. Acute bacterial sinusitis    Does have some dysphonia since thyroidectomy; now with some worsening and TFL shows right polypoid VF lesion. Does not appear malignant, query if related to her coughing and exacerbations. We discussed management for this, but given her age and cormorbidities, she would  like to observe which is reasonable since her voice is adequate for her use. - Options include Speech Rx (may help some) and bx and excision. She would like to forego these and observe - f/u in 3 months  For her ethmoid mucocele and recent sinus infection as well as AR, she is currently getting better after unrelated and related abx  and steroids. She has not had any change in her mucocele in 7 years, so reasonable to observe given she does not want to do anything; no ocular sx - Continue vantin  and finish course - Continue flonase ; will increase astelin  to BID - Discussed steroids but will avoid since feeling better - Daily nasal rinses  F/u in 3 months  See below regarding exact medications prescribed this encounter including dosages and route: Meds ordered this encounter  Medications   fluticasone  (FLONASE ) 50 MCG/ACT nasal spray    Sig: Place 2 sprays into both nostrils daily.    Dispense:  11 mL    Refill:  5   azelastine  (ASTELIN ) 0.1 % nasal spray    Sig: Place 2 sprays into both nostrils 2 (two) times daily. Use in each nostril as directed    Dispense:  30 mL    Refill:  12      Thank you for allowing me the opportunity to care for your patient. Please do not hesitate to contact me should you have any other questions.  Sincerely, Eldora Blanch, MD Otolarynoglogist (ENT), [facility] Phone: [phone] Fax: [phone]  10/06/2023, 12:12 PM   MDM:  Level 4 Complexity/Problems addressed: multiple chronic problems, now with exacerbation and new problems Data complexity: mod - independent review and interpretation of CT imaging; review of notes and labs - Morbidity: mod  - Prescription Drug prescribed or managed: yes

## 2023-10-02 NOTE — Patient Instructions (Signed)
 Continue your antibiotics Use flonsae two sprays each nostril twice per day Use astelin  two sprays each nostril twice per day Aureliano Med Nasal Saline Rinse daily - start nasal saline rinses with NeilMed Bottle available over the counter    Nasal Saline Irrigation instructions: If you choose to make your own salt water solution, You will need: Salt (kosher, canning, or pickling salt) Baking soda Nasal irrigation bottle (i.e. Aureliano Med Sinus Rinse) Measuring spoon ( teaspoon) Distilled / boiled water   Mix solution Mix 1 teaspoon of salt, 1/2 teaspoon of baking soda and 1 cup of water into irrigation bottle ** May use saline packet instead of homemade recipe for this step if you prefer If medicine was prescribed to be mixed with solution, place this into bottle Examples 2 inches of 2% mupirocin  ointment Budesonide solution Position your head: Lean over sink (about 45 degrees) Rotate head (about 45 degrees) so that one nostril is above the other Irrigate Insert tip of irrigation bottle into upper nostril so it forms a comfortable seal Irrigate while breathing through your mouth May remove the straw from the bottle in order to irrigate the entire solution (important if medicine was added) Exhale through nose when finished and blow nose as necessary  Repeat on opposite side with other 1/2 of solution (120 mL) or remake solution if all 240 mL was used on first side Wash irrigation bottle regularly, replace every 3 months

## 2023-10-04 ENCOUNTER — Encounter: Payer: Self-pay | Admitting: Internal Medicine

## 2023-10-04 NOTE — Telephone Encounter (Signed)
 Error

## 2023-10-05 ENCOUNTER — Encounter: Payer: Self-pay | Admitting: Cardiology

## 2023-10-05 ENCOUNTER — Ambulatory Visit: Payer: Medicare Other | Attending: Cardiology | Admitting: Cardiology

## 2023-10-05 VITALS — BP 152/64 | HR 67 | Ht 62.0 in | Wt 166.0 lb

## 2023-10-05 DIAGNOSIS — I5032 Chronic diastolic (congestive) heart failure: Secondary | ICD-10-CM | POA: Insufficient documentation

## 2023-10-05 DIAGNOSIS — N184 Chronic kidney disease, stage 4 (severe): Secondary | ICD-10-CM | POA: Insufficient documentation

## 2023-10-05 DIAGNOSIS — E782 Mixed hyperlipidemia: Secondary | ICD-10-CM | POA: Diagnosis present

## 2023-10-05 DIAGNOSIS — I480 Paroxysmal atrial fibrillation: Secondary | ICD-10-CM | POA: Insufficient documentation

## 2023-10-05 DIAGNOSIS — I10 Essential (primary) hypertension: Secondary | ICD-10-CM | POA: Insufficient documentation

## 2023-10-05 DIAGNOSIS — G4733 Obstructive sleep apnea (adult) (pediatric): Secondary | ICD-10-CM | POA: Insufficient documentation

## 2023-10-05 DIAGNOSIS — J449 Chronic obstructive pulmonary disease, unspecified: Secondary | ICD-10-CM | POA: Diagnosis present

## 2023-10-05 MED ORDER — FUROSEMIDE 40 MG PO TABS: 40.0000 mg | ORAL_TABLET | Freq: Two times a day (BID) | ORAL | 4 refills | Status: DC

## 2023-10-05 NOTE — Patient Instructions (Signed)
 Medication Instructions:  - No changes *If you need a refill on your cardiac medications before your next appointment, please call your pharmacy*  Lab Work: - None ordered  Testing/Procedures: - None ordered  Follow-Up: At [facility], you and your health needs are our priority.  As part of our continuing mission to provide you with exceptional heart care, we have created designated Provider Care Teams.  These Care Teams include your primary Cardiologist (physician) and Advanced Practice Providers (APPs -  Physician Assistants and Nurse Practitioners) who all work together to provide you with the care you need, when you need it.  Your next appointment:   3 month(s)  Provider:   Charlsie Quest, NP

## 2023-10-05 NOTE — Progress Notes (Signed)
 Cardiology Office Note:  .   Date:  10/05/2023  ID:  Ronal [employer], DOB [birth year], MRN [id number] PCP: Patient, No Pcp Per  [facility] Providers Cardiologist:  None    History of Present Illness: SABRA   Caoilainn Sacks Poirier is a 83 y.o. 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   Previous stress testing showed moderate fixed inferior lateral defect with moderate reversibility normal ejection fraction. This was suggestive of prior infarct and moderate peri-infarct ischemia. Left heart catheterization in 2015 showed mild irregularities with no obstructive disease and normal LVEDP.   She was hospitalized at [facility] from 7/15 - 04/22/2023 after presenting with generalized weakness and shortness of breath.  After recent hospitalization from 6/21 - 03/31/2023 due to hypocalcemia and hypomagnesemia.  During recent hospitalization she continued to have electrolyte abnormalities.  With her acute on chronic CKD stage IV and V.  She continued to follow with nephrology.  While she was inpatient atrial fibrillation was noted on telemetry with PACs with ventricular rates of 120 bpm.  Spontaneously resolved without intervention after correction of electrolytes.  Echocardiogram showed mild pulmonary hypertension and severe left atrial dilatation.  She was considered stable for discharge and was discharged on 04/22/2023.  She presented back to [facility] emergency department 05/22/2023 with complaints of shortness of breath.  She was found in atrial fibrillation with rate of 106 and spontaneously converted.  She was placed on apixaban  2.5 mg twice daily for stroke prophylaxis with a CHA2DS2-VASc of at least 6.  He was recommended with a ZIO monitor on discharge to determine the burden of atrial  fibrillation.  Unfortunately she was not considered a candidate for SGLT2 inhibitors MRA due to kidney function.  She was stable for discharge and discharged on 06/04/2023.  ZIO XT monitor was placed for 2 weeks to determine her A-fib burden.  She continued to have complaints of palpitations regularly and was continued on metoprolol  titrate 50 mg twice daily.    She was last seen in clinic 07/24/2023. She stated she had not been doing well.  She had severe to fatigue, generalized weakness, and right lower extremity pain.  With her complaints of fatigue metoprolol  was decreased to 25 mg twice daily and she was continued on apixaban  2.5 mg twice daily for stroke prophylaxis.  There were no other changes that were made to her medication regimen at that time.  And no further testing that was ordered.  She returns to clinic today accompanied by her husband. She states that she has been doing well. She had some shortness of breath coming into the clinic today she relates to the cold air. 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 There is no upcoming procedure scheduled and the plan is to just watch the area. She has been compliant with her medication regimen. Denies any bleeding without any blood noted in her urine or stool.  She states that she was evaluated by urgent care on 09/07/2023 for cough.  Cough panel for approximately 3 weeks.  She was treated with Zithromax  for acute bronchitis.  ROS: 10 point review of systems were reviewed and considered negative except what is been listed in the HPI  Studies Reviewed: SABRA   EKG Interpretation Date/Time:  Friday October 05 2023  10:54:58 EST Ventricular Rate:  67 PR Interval:  178 QRS Duration:  82 QT Interval:  428 QTC Calculation: 452 R Axis:   -18  Text Interpretation: Normal sinus rhythm Minimal voltage criteria for LVH, may be normal variant ( R in aVL ) Possible Anterior infarct , age undetermined When compared with ECG of  30-Jul-2023 01:40, PREVIOUS ECG IS PRESENT Confirmed by Gerard Frederick ([id number]) on 10/05/2023 11:07:23 AM    TTE 04/19/2023 1. Left ventricular ejection fraction, by estimation, is 60 to 65%. The  left ventricle has normal function. The left ventricle has no regional  wall motion abnormalities. There is mild left ventricular hypertrophy.  Left ventricular diastolic parameters  are indeterminate.   2. Right ventricular systolic function is normal. The right ventricular  size is mildly enlarged. There is mildly elevated pulmonary artery  systolic pressure.   3. Left atrial size was severely dilated.   4. Right atrial size was mildly dilated.   5. The mitral valve is normal in structure. Mild mitral valve  regurgitation.   6. The aortic valve is tricuspid. Aortic valve regurgitation is not  visualized.   7. The inferior vena cava is normal in size with greater than 50%  respiratory variability, suggesting right atrial pressure of 3 mmHg.  Risk Assessment/Calculations:    CHA2DS2-VASc Score = 6   This indicates a 9.7% annual risk of stroke. The patient's score is based upon: CHF History: 1 HTN History: 1 Diabetes History: 1 Stroke History: 0 Vascular Disease History: 0 Age Score: 2 Gender Score: 1    HYPERTENSION CONTROL Vitals:   10/05/23 1041 10/05/23 1100  BP: (!) 177/64 (!) 152/64    The patient's blood pressure is elevated above target today.  In order to address the patient's elevated BP: Blood pressure will be monitored at home to determine if medication changes need to be made. (patient had just taken her medications)          Physical Exam:   VS:  BP (!) 152/64 (BP Location: Right Arm, Patient Position: Sitting, Cuff Size: Normal)   Pulse 67   Ht 5' 2 (1.575 m)   Wt 166 lb (75.3 kg)   SpO2 95%   BMI 30.36 kg/m    Wt Readings from Last 3 Encounters:  10/05/23 166 lb (75.3 kg)  10/02/23 155 lb (70.3 kg)  09/24/23 155 lb (70.3 kg)    GEN: Well nourished, well  developed in no acute distress NECK: No JVD; No carotid bruits CARDIAC: RRR, II/VI systolic murmur R/LUSB without rubs or gallops RESPIRATORY:  Clear with diminished bases to auscultation without rales, wheezing or rhonchi  ABDOMEN: Soft, non-tender, obese, non-distended EXTREMITIES:  1+ edema that is improving; No deformity   ASSESSMENT AND PLAN: .   Paroxysmal atrial fibrillation there was noted to be in sinus rhythm today.  She is continued on metoprolol  tartrate 25 mg twice daily and apixaban  2.5 mg twice daily for CHA2DS2-VASc score of at least 6 for stroke prophylaxis.  She denies any bleeding or any blood noted in her stool or urine.  She states that she has been compliant with medication has not missed any doses.  EKG today reveals sinus rhythm with rate of 67 with left axis deviation and LVH with no acute change from prior studies.  Chronic HFpEF with last LVEF 60 to 65%, no RWMA, mitral regurgitation.  She is continued on furosemide  40 mg twice daily.  She is not currently a candidate for SGLT2 inhibitor or  MRA therapy due to kidney function.  Continue with heart failure education.  Can consider consolidating her metoprolol  to tartrate to Toprol -XL but she just had her prescription filled can revisit at next refill.  She has been encouraged to continue to wear herself daily, limit her fluid intake to maintain a low-sodium diet.  Primary hypertension with blood pressure was 177/64 with recheck of 152/64.  Patient is just taking her medications on arrival to the clinic today.  She is continued on amlodipine  5 mg twice daily, furosemide  40 mg twice daily, transplant milligrams 3 times a day, metoprolol  tartrate 25 mg twice daily.  She has been encouraged to continue to monitor blood pressures 1 to 2 hours postmedication administration at home as well.  Mixed hyperlipidemia with LDL 69.  She is continue rosuvastatin  40 mg daily.  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  She continues to follow with nephrology.  Obstructive sleep apnea with CPAP.  She continues to follow with pulmonary for management of her sleep apnea.  COPD where she is continued on bronchodilators.  She just recently finished antibiotic therapy for an acute episode of bronchitis.       Dispo: Patient return to clinic to see MD/APP in 3 months or sooner if needed  Signed, Jalyne Brodzinski, NP

## 2023-10-11 ENCOUNTER — Encounter: Payer: Medicare Other | Attending: Physician Assistant | Admitting: Physician Assistant

## 2023-10-11 DIAGNOSIS — J449 Chronic obstructive pulmonary disease, unspecified: Secondary | ICD-10-CM | POA: Insufficient documentation

## 2023-10-11 DIAGNOSIS — I480 Paroxysmal atrial fibrillation: Secondary | ICD-10-CM | POA: Diagnosis not present

## 2023-10-11 DIAGNOSIS — I132 Hypertensive heart and chronic kidney disease with heart failure and with stage 5 chronic kidney disease, or end stage renal disease: Secondary | ICD-10-CM | POA: Insufficient documentation

## 2023-10-11 DIAGNOSIS — I509 Heart failure, unspecified: Secondary | ICD-10-CM | POA: Insufficient documentation

## 2023-10-11 DIAGNOSIS — N186 End stage renal disease: Secondary | ICD-10-CM | POA: Diagnosis not present

## 2023-10-11 DIAGNOSIS — Z7901 Long term (current) use of anticoagulants: Secondary | ICD-10-CM | POA: Insufficient documentation

## 2023-10-11 DIAGNOSIS — E1122 Type 2 diabetes mellitus with diabetic chronic kidney disease: Secondary | ICD-10-CM | POA: Diagnosis not present

## 2023-10-11 DIAGNOSIS — E11622 Type 2 diabetes mellitus with other skin ulcer: Secondary | ICD-10-CM | POA: Diagnosis present

## 2023-10-11 DIAGNOSIS — L97812 Non-pressure chronic ulcer of other part of right lower leg with fat layer exposed: Secondary | ICD-10-CM | POA: Diagnosis not present

## 2023-10-11 NOTE — Progress Notes (Addendum)
 Herford, Yerlin E ([id number]) [id number] Page 1 of 8 Visit Report for 10/11/2023 Chief Complaint Document Details Patient Name: Date of Service: [name], [name] [place] 10/11/2023 9:45 A M Medical Record Number: [id number] Patient Account Number: [id number] Date of Birth/Sex: Treating RN: [birth year] (83 y.o. [name] Primary Care Provider: PA SANDRA, [place] Other Clinician: Referring Provider: Treating Provider/Extender: Bethena Andre Lenon Layman Devra in Treatment: 7 Information Obtained from: Patient Chief Complaint Right LE Ulcer Electronic Signature(s) Signed: 10/11/2023 9:38:39 AM By: Bethena Andre PA-C Entered By: Bethena Andre on 10/11/2023 09:38:39 -------------------------------------------------------------------------------- Debridement Details Patient Name: Date of Service: [name] [name] MAILLARD E. 10/11/2023 9:45 A M Medical Record Number: [id number] Patient Account Number: [id number] Date of Birth/Sex: Treating RN: [birth year] (83 y.o. [name] Primary Care Provider: PA SANDRA, [place] Other Clinician: Referring Provider: Treating Provider/Extender: Bethena Andre Lenon Layman Devra in Treatment: 7 Debridement Performed for Assessment: Wound #1 Right,Lateral Lower Leg Performed By: Physician Bethena Andre, PA-C The following information was scribed by: Vaughn [name] The information was scribed for: Bethena Andre Debridement Type: Debridement Level of Consciousness (Pre-procedure): Awake and Alert Pre-procedure Verification/Time Out Yes - 10:23 Taken: Pain Control: Lidocaine  4% T opical Solution Percent of Wound Bed Debrided: 100% T Area Debrided (cm): otal 5.3 Tissue and other material debrided: Viable, Non-Viable, Slough, Subcutaneous, Slough, Hyper-granulation Level: Skin/Subcutaneous Tissue Debridement Description: Excisional Instrument: Curette Bleeding: Moderate Hemostasis Achieved: Pressure Response to Treatment: Procedure was tolerated  well Level of Consciousness [name] [id number] Page 2 of 8 Level of Consciousness (Post- Awake and Alert procedure): Post Debridement Measurements of Total Wound Length: (cm) 2.7 Width: (cm) 2.5 Depth: (cm) 0.1 Volume: (cm) 0.53 Character of Wound/Ulcer Post Debridement: Stable Post Procedure Diagnosis Same as Pre-procedure Electronic Signature(s) Signed: 10/11/2023 3:53:53 PM By: Bethena Andre PA-C Signed: 10/11/2023 4:11:19 PM By: Vaughn [name] Entered By: Vaughn [name] on 10/11/2023 10:27:07 -------------------------------------------------------------------------------- HPI Details Patient Name: Date of Service: [name], [name] MAILLARD E. 10/11/2023 9:45 A M Medical Record Number: [id number] Patient Account Number: [id number] Date of Birth/Sex: Treating RN: Nov 06, 1940 (83 y.o. [name] Primary Care Provider: PA TIENT, [place] Other Clinician: Referring Provider: Treating Provider/Extender: Bethena Andre Lenon Layman Devra in Treatment: 7 History of Present Illness HPI Description: 08-23-2023 upon evaluation today patient presents for initial inspection here in our clinic due to an issue that she has been having with a skin tear of the right lateral lower extremity where she tells me that a storm door was going back into her leg causing a significant skin tear. With her leg hurting she unfortunately noted around July 23, 2023 that she was having a regular heart rate where it was jumping up going back down and it was so erratic and irregular she ended up calling an ambulance to take her to the hospital this ended up with a hospital stent that went from the July 23, 2023 through August 06, 2023. During the course of that hospital stay on 08-02-2023 she had an OR debridement it sounds like she may have had a significant hematoma based on what I am seeing and hearing. Following that time she has been using some Xeroform here and  there and that has helped to some degree right now she tells me she has not been using that as she did not have any more of that at this point. She did have an x-ray on 07-30-2023 which was negative for any signs of fracture or acute bony abnormality. Patient does have a history of diabetes  mellitus type 2, hypertension, COPD, chronic kidney disease stage IV, atrial fibrillation, long-term use of anticoagulant therapy. 08-27-2023 upon evaluation today patient appears to be doing well currently in regard to her wound. This is actually showing signs of good improvement she had a lot of bleeding last week after debridement this was necessary but nonetheless she tells me that it really did bleed a lot. She was able to finally get it stop she did get her supplies she was very appreciative of this as well. Fortunately I do not see any signs of active infection locally or systemically at this time. 09-10-2023 upon evaluation today patient appears to be doing better in regard to her wound from a depth perspective although there is some need for debridement here today. I discussed that with her she had quite a bit of hypergranulation and slough buildup. She actually did allow for debridement and I was able to clean away the necrotic debris quite well without complications. 09-17-2023 upon evaluation today patient appears to be doing well currently in regard to her wound. She has been tolerating the dressing changes without complication. Fortunately I do not see any evidence of worsening overall and I believe that the patient is making good headway here towards closure which is great news. 10-10-2022 upon evaluation patient presents after not being here for almost a month. She tells me that the wound started not looking so great so she figured she need to make an appointment to come back in. Explained that we really do want to see her weekly to ensure that things continue along the right path she voiced  understanding. Electronic Signature(s) Signed: 10/11/2023 10:42:39 AM By: Bethena Ferraris PA-C Entered By: Bethena Ferraris on 10/11/2023 10:42:39 [name], RONAL BRAVO ([id number]) [id number].pdf Page 3 of 8 -------------------------------------------------------------------------------- Physical Exam Details Patient Name: Date of Service: [name], [name] 10/11/2023 9:45 A M Medical Record Number: [id number] Patient Account Number: [id number] Date of Birth/Sex: Treating RN: 1940/12/30 (83 y.o. [name] Primary Care Provider: PA SANDRA, [place] Other Clinician: Referring Provider: Treating Provider/Extender: Bethena Ferraris Lenon Layman Devra in Treatment: 7 Constitutional Well-nourished and well-hydrated in no acute distress. Respiratory normal breathing without difficulty. Psychiatric this patient is able to make decisions and demonstrates good insight into disease process. Alert and Oriented x 3. pleasant and cooperative. Notes Upon inspection patient had significant sloughing biofilm buildup as well as quite a bit of hypergranulation that needed to be pared down. Actually did perform debridement down to good subcutaneous tissue of the slough, biofilm, and hypergranulation tissue and postdebridement wound bed appears to be doing much better. Electronic Signature(s) Signed: 10/11/2023 10:43:04 AM By: Bethena Ferraris PA-C Entered By: Bethena Ferraris on 10/11/2023 10:43:04 -------------------------------------------------------------------------------- Physician Orders Details Patient Name: Date of Service: [name], [name] E. 10/11/2023 9:45 A M Medical Record Number: [id number] Patient Account Number: [id number] Date of Birth/Sex: Treating RN: 24-Oct-1940 (83 y.o. [name] Primary Care Provider: PA SANDRA, [place] Other Clinician: Referring Provider: Treating Provider/Extender: Bethena Ferraris Lenon Layman Devra in Treatment: 7 The following information was scribed by:  Vaughn [name] The information was scribed for: Bethena Ferraris Verbal / Phone Orders: No Diagnosis Coding ICD-10 Coding Code Description S81.811A Laceration without foreign body, right lower leg, initial encounter E11.622 Type 2 diabetes mellitus with other skin ulcer L97.812 Non-pressure chronic ulcer of other part of right lower leg with fat layer exposed I10 Essential (primary) hypertension J44.89 Other specified chronic obstructive pulmonary disease [name], [name] E ([id number]) [phone].pdf Page 4 of 8 N18.4  Chronic kidney disease, stage 4 (severe) I48.0 Paroxysmal atrial fibrillation Z79.01 Long term (current) use of anticoagulants Follow-up Appointments Return Appointment in 1 week. Bathing/ Shower/ Hygiene May shower; gently cleanse wound with antibacterial soap, rinse and pat dry prior to dressing wounds - Use Dial original GOLD soap to clean wound after showering No tub bath. Anesthetic (Use 'Patient Medications' Section for Anesthetic Order Entry) Lidocaine  applied to wound bed Edema Control - Orders / Instructions Elevate, Exercise Daily and A void Standing for Long Periods of Time. Elevate legs to the level of the heart and pump ankles as often as possible Elevate leg(s) parallel to the floor when sitting. DO YOUR BEST to sleep in the bed at night. DO NOT sleep in your recliner. Long hours of sitting in a recliner leads to swelling of the legs and/or potential wounds on your backside. Wound Treatment Wound #1 - Lower Leg Wound Laterality: Right, Lateral Cleanser: Byram Ancillary Kit - 15 Day Supply (Generic) 3 x Per Week/15 Days Discharge Instructions: Use supplies as instructed; Kit contains: (15) Saline Bullets; (15) 3x3 Gauze; 15 pr Gloves Cleanser: Wound Cleanser 3 x Per Week/15 Days Discharge Instructions: Wash your hands with soap and water. Remove old dressing, discard into plastic bag and place into trash. Cleanse the wound with Wound  Cleanser prior to applying a clean dressing using gauze sponges, not tissues or cotton balls. Do not scrub or use excessive force. Pat dry using gauze sponges, not tissue or cotton balls. Prim Dressing: Hydrofera Blue Ready Transfer Foam, 4x5 (in/in) (Generic) 3 x Per Week/15 Days ary Discharge Instructions: Apply Hydrofera Blue Ready to wound bed as directed Secondary Dressing: ABD Pad 5x9 (in/in) 3 x Per Week/15 Days Discharge Instructions: Cover with ABD pad Secondary Dressing: (BORDER) Zetuvit Plus SILICONE BORDER Dressing 5x5 (in/in) 3 x Per Week/15 Days Discharge Instructions: Please do not put silicone bordered dressings under wraps. Use non-bordered dressing only. Secured With: Medipore T - 71M Medipore H Soft Cloth Surgical T ape ape, 2x2 (in/yd) 3 x Per Week/15 Days Secured With: [employer] or Non-Sterile 6-ply 4.5x4 (yd/yd) 3 x Per Week/15 Days Discharge Instructions: Apply Kerlix as directed Electronic Signature(s) Signed: 10/11/2023 3:53:53 PM By: Bethena Ferraris PA-C Signed: 10/11/2023 4:11:19 PM By: Vaughn [name] Entered By: Vaughn [name] on 10/11/2023 10:28:15 -------------------------------------------------------------------------------- Problem List Details Patient Name: Date of Service: [name] [name] MAILLARD E. 10/11/2023 9:45 A M Medical Record Number: [id number] Patient Account Number: [id number] Date of Birth/Sex: Treating RN: 1941-09-16 (83 y.o. [name] Primary Care Provider: PA SANDRA, [place] Other Clinician: Referring Provider: Treating Provider/Extender: Bethena Ferraris Lenon Layman Devra in Treatment: [address] [place] E ([id number]) [id number] Page 5 of 8 Active Problems ICD-10 Encounter Code Description Active Date MDM Diagnosis S81.811A Laceration without foreign body, right lower leg, initial encounter 08/23/2023 No Yes E11.622 Type 2 diabetes mellitus with other skin ulcer 08/23/2023 No Yes L97.812 Non-pressure chronic  ulcer of other part of right lower leg with fat layer 08/23/2023 No Yes exposed I10 Essential (primary) hypertension 08/23/2023 No Yes J44.89 Other specified chronic obstructive pulmonary disease 08/23/2023 No Yes N18.4 Chronic kidney disease, stage 4 (severe) 08/23/2023 No Yes I48.0 Paroxysmal atrial fibrillation 08/23/2023 No Yes Z79.01 Long term (current) use of anticoagulants 08/23/2023 No Yes Inactive Problems Resolved Problems Electronic Signature(s) Signed: 10/11/2023 9:38:36 AM By: Bethena Ferraris PA-C Entered By: Bethena Ferraris on 10/11/2023 09:38:36 -------------------------------------------------------------------------------- Progress Note Details Patient Name: Date of Service: Frandsen, [name] MAILLARD E. 10/11/2023 9:45 A M Medical Record Number: [id number]  Patient Account Number: [id number] Date of Birth/Sex: Treating RN: 28-Mar-1941 (83 y.o. [name] Primary Care Provider: PA SANDRA, [place] Other Clinician: Referring Provider: Treating Provider/Extender: Bethena Andre Lenon Layman Devra in Treatment: 7 Subjective Chief Complaint Information obtained from Patient Right LE Ulcer [name], [name] [id number] Page 6 of 8 History of Present Illness (HPI) 08-23-2023 upon evaluation today patient presents for initial inspection here in our clinic due to an issue that she has been having with a skin tear of the right lateral lower extremity where she tells me that a storm door was going back into her leg causing a significant skin tear. With her leg hurting she unfortunately noted around July 23, 2023 that she was having a regular heart rate where it was jumping up going back down and it was so erratic and irregular she ended up calling an ambulance to take her to the hospital this ended up with a hospital stent that went from the July 23, 2023 through August 06, 2023. During the course of that hospital stay on 08-02-2023 she had an OR  debridement it sounds like she may have had a significant hematoma based on what I am seeing and hearing. Following that time she has been using some Xeroform here and there and that has helped to some degree right now she tells me she has not been using that as she did not have any more of that at this point. She did have an x-ray on 07-30-2023 which was negative for any signs of fracture or acute bony abnormality. Patient does have a history of diabetes mellitus type 2, hypertension, COPD, chronic kidney disease stage IV, atrial fibrillation, long-term use of anticoagulant therapy. 08-27-2023 upon evaluation today patient appears to be doing well currently in regard to her wound. This is actually showing signs of good improvement she had a lot of bleeding last week after debridement this was necessary but nonetheless she tells me that it really did bleed a lot. She was able to finally get it stop she did get her supplies she was very appreciative of this as well. Fortunately I do not see any signs of active infection locally or systemically at this time. 09-10-2023 upon evaluation today patient appears to be doing better in regard to her wound from a depth perspective although there is some need for debridement here today. I discussed that with her she had quite a bit of hypergranulation and slough buildup. She actually did allow for debridement and I was able to clean away the necrotic debris quite well without complications. 09-17-2023 upon evaluation today patient appears to be doing well currently in regard to her wound. She has been tolerating the dressing changes without complication. Fortunately I do not see any evidence of worsening overall and I believe that the patient is making good headway here towards closure which is great news. 10-10-2022 upon evaluation patient presents after not being here for almost a month. She tells me that the wound started not looking so great so she figured  she need to make an appointment to come back in. Explained that we really do want to see her weekly to ensure that things continue along the right path she voiced understanding. Objective Constitutional Well-nourished and well-hydrated in no acute distress. Vitals Time Taken: 9:54 AM, Height: 62 in, Weight: 160 lbs, BMI: 29.3, Temperature: 97.6 F, Pulse: 76 bpm, Respiratory Rate: 20 breaths/min, Blood Pressure: 187/68 mmHg. General Notes: pt states has not taken morning BP meds yet  today Respiratory normal breathing without difficulty. Psychiatric this patient is able to make decisions and demonstrates good insight into disease process. Alert and Oriented x 3. pleasant and cooperative. General Notes: Upon inspection patient had significant sloughing biofilm buildup as well as quite a bit of hypergranulation that needed to be pared down. Actually did perform debridement down to good subcutaneous tissue of the slough, biofilm, and hypergranulation tissue and postdebridement wound bed appears to be doing much better. Integumentary (Hair, Skin) Wound #1 status is Open. Original cause of wound was Skin T ear/Laceration. The date acquired was: 08/21/2023. The wound has been in treatment 7 weeks. The wound is located on the Right,Lateral Lower Leg. The wound measures 2.7cm length x 2.5cm width x 0.1cm depth; 5.301cm^2 area and 0.53cm^3 volume. There is Fat Layer (Subcutaneous Tissue) exposed. There is no tunneling or undermining noted. There is a medium amount of serosanguineous drainage noted. There is small (1-33%) red, hyper - granulation within the wound bed. There is a large (67-100%) amount of necrotic tissue within the wound bed including Adherent Slough. Assessment Active Problems ICD-10 Laceration without foreign body, right lower leg, initial encounter Type 2 diabetes mellitus with other skin ulcer Non-pressure chronic ulcer of other part of right lower leg with fat layer  exposed Essential (primary) hypertension Other specified chronic obstructive pulmonary disease Chronic kidney disease, stage 4 (severe) Paroxysmal atrial fibrillation Long term (current) use of anticoagulants [name], [name] E ([id number]) [phone].pdf Page 7 of 8 Procedures Wound #1 Pre-procedure diagnosis of Wound #1 is a Skin T located on the Right,Lateral Lower Leg . There was a Excisional Skin/Subcutaneous Tissue Debridement ear with a total area of 5.3 sq cm performed by Bethena Ferraris, PA-C. With the following instrument(s): Curette to remove Viable and Non-Viable tissue/material. Material removed includes Subcutaneous Tissue, Slough, and Hyper-granulation after achieving pain control using Lidocaine  4% T opical Solution. No specimens were taken. A time out was conducted at 10:23, prior to the start of the procedure. A Moderate amount of bleeding was controlled with Pressure. The procedure was tolerated well. Post Debridement Measurements: 2.7cm length x 2.5cm width x 0.1cm depth; 0.53cm^3 volume. Character of Wound/Ulcer Post Debridement is stable. Post procedure Diagnosis Wound #1: Same as Pre-Procedure Plan Follow-up Appointments: Return Appointment in 1 week. Bathing/ Shower/ Hygiene: May shower; gently cleanse wound with antibacterial soap, rinse and pat dry prior to dressing wounds - Use Dial original GOLD soap to clean wound after showering No tub bath. Anesthetic (Use 'Patient Medications' Section for Anesthetic Order Entry): Lidocaine  applied to wound bed Edema Control - Orders / Instructions: Elevate, Exercise Daily and Avoid Standing for Long Periods of Time. Elevate legs to the level of the heart and pump ankles as often as possible Elevate leg(s) parallel to the floor when sitting. DO YOUR BEST to sleep in the bed at night. DO NOT sleep in your recliner. Long hours of sitting in a recliner leads to swelling of the legs and/or potential wounds on  your backside. WOUND #1: - Lower Leg Wound Laterality: Right, Lateral Cleanser: Byram Ancillary Kit - 15 Day Supply (Generic) 3 x Per Week/15 Days Discharge Instructions: Use supplies as instructed; Kit contains: (15) Saline Bullets; (15) 3x3 Gauze; 15 pr Gloves Cleanser: Wound Cleanser 3 x Per Week/15 Days Discharge Instructions: Wash your hands with soap and water. Remove old dressing, discard into plastic bag and place into trash. Cleanse the wound with Wound Cleanser prior to applying a clean dressing using gauze sponges, not tissues or cotton  balls. Do not scrub or use excessive force. Pat dry using gauze sponges, not tissue or cotton balls. Prim Dressing: Hydrofera Blue Ready Transfer Foam, 4x5 (in/in) (Generic) 3 x Per Week/15 Days ary Discharge Instructions: Apply Hydrofera Blue Ready to wound bed as directed Secondary Dressing: ABD Pad 5x9 (in/in) 3 x Per Week/15 Days Discharge Instructions: Cover with ABD pad Secondary Dressing: (BORDER) Zetuvit Plus SILICONE BORDER Dressing 5x5 (in/in) 3 x Per Week/15 Days Discharge Instructions: Please do not put silicone bordered dressings under wraps. Use non-bordered dressing only. Secured With: Medipore T - 3M Medipore H Soft Cloth Surgical T ape ape, 2x2 (in/yd) 3 x Per Week/15 Days Secured With: [employer] or Non-Sterile 6-ply 4.5x4 (yd/yd) 3 x Per Week/15 Days Discharge Instructions: Apply Kerlix as directed 1. I would recommend based on what we are seeing that we have the patient go ahead and continue to for any signs of infection or worsening overall I feel like we are doing quite well. 2. I am going to recommend as well that the patient should continue to utilize ABD after applying the Hydrofera Blue to the wound bed. 3. I would also recommend the patient should continue to monitor utilize roll gauze to secure in place and then she is using her compression sock over top of this which I think should do well. We will see patient  back for reevaluation in 1 week here in the clinic. If anything worsens or changes patient will contact our office for additional recommendations. Electronic Signature(s) Signed: 10/11/2023 10:44:03 AM By: Bethena Ferraris PA-C Entered By: Bethena Ferraris on 10/11/2023 10:44:02 -------------------------------------------------------------------------------- SuperBill Details Patient Name: Date of Service: Leite, [name] RY E. 10/11/2023 Medical Record Number: [id number] Patient Account Number: [id number] [name], [name] ([id number]) [phone].pdf Page 8 of 8 Date of Birth/Sex: Treating RN: [birth year] (83 y.o. [name] Primary Care Provider: PA SANDRA, [place] Other Clinician: Referring Provider: Treating Provider/Extender: Bethena Ferraris Lenon Layman Devra in Treatment: 7 Diagnosis Coding ICD-10 Codes Code Description [phone] Laceration without foreign body, right lower leg, initial encounter E11.622 Type 2 diabetes mellitus with other skin ulcer L97.812 Non-pressure chronic ulcer of other part of right lower leg with fat layer exposed I10 Essential (primary) hypertension J44.89 Other specified chronic obstructive pulmonary disease N18.4 Chronic kidney disease, stage 4 (severe) I48.0 Paroxysmal atrial fibrillation Z79.01 Long term (current) use of anticoagulants Facility Procedures : CPT4 Code: [id number] Description: 11042 - DEB SUBQ TISSUE 20 SQ CM/< ICD-10 Diagnosis Description L97.812 Non-pressure chronic ulcer of other part of right lower leg with fat layer exp Modifier: osed Quantity: 1 Physician Procedures : CPT4 Code Description Modifier 11042 11042 - WC PHYS SUBQ TISS 20 SQ CM ICD-10 Diagnosis Description L97.812 Non-pressure chronic ulcer of other part of right lower leg with fat layer exposed Quantity: 1 Electronic Signature(s) Signed: 10/11/2023 10:55:40 AM By: Vaughn [name] Signed: 10/11/2023 3:53:53 PM By: Bethena Ferraris PA-C Previous Signature: 10/11/2023  10:44:20 AM Version By: Bethena Ferraris PA-C Entered By: Vaughn [name] on 10/11/2023 10:55:40

## 2023-10-11 NOTE — Progress Notes (Addendum)
 [name] [name] ([id number]) [id number] Page 1 of 9 Visit Report for 10/11/2023 Arrival Information Details Patient Name: Date of Service: [name] [name] 10/11/2023 9:45 A M Medical Record Number: [id number] Patient Account Number: [id number] Date of Birth/Sex: Treating RN: [birth year] (83 y.o. [name] [name] Primary Care Diamante Truszkowski: [place], [place] Other Clinician: Referring Manami Tutor: Treating Rally Ouch/Extender: Bethena Andre Lenon Layman Devra in Treatment: 7 Visit Information History Since Last Visit Added or deleted any medications: Yes Patient Arrived: Ambulatory Any new allergies or adverse reactions: No Arrival Time: 09:54 Had a fall or experienced change in No Accompanied By: self activities of daily living that may affect Transfer Assistance: None risk of falls: Patient Identification Verified: Yes Hospitalized since last visit: No Secondary Verification Process Completed: Yes Has Dressing in Place as Prescribed: Yes Patient Has Alerts: Yes Has Compression in Place as Prescribed: Yes Patient Alerts: Patient on Blood Thinner Pain Present Now: Yes Fistula LUE type 2 diabetic Notes recent UTI per pt Electronic Signature(s) Signed: 10/11/2023 4:11:19 PM By: Vaughn [name] Entered By: Vaughn [name] on 10/11/2023 10:05:12 -------------------------------------------------------------------------------- Clinic Level of Care Assessment Details Patient Name: Date of Service: [name] [name] 10/11/2023 9:45 A M Medical Record Number: [id number] Patient Account Number: [id number] Date of Birth/Sex: Treating RN: 11/23/1940 (83 y.o. [name] [name] Primary Care Unique Sillas: [place], [place] Other Clinician: Referring Khristine Verno: Treating Anastasia Tompson/Extender: Bethena Andre Lenon Layman Devra in Treatment: 7 Clinic Level of Care Assessment Items TOOL 1 Quantity Score []  - 0 Use when EandM and Procedure is performed on INITIAL visit ASSESSMENTS - Nursing  Assessment / Reassessment []  - 0 General Physical Exam (combine w/ comprehensive assessment (listed just below) when performed on new pt. evals) []  - 0 Comprehensive Assessment (HX, ROS, Risk Assessments, Wounds Hx, etc.) [name] [name] [name] [name] [id number] Page 2 of 9 ASSESSMENTS - Wound and Skin Assessment / Reassessment []  - 0 Dermatologic / Skin Assessment (not related to wound area) ASSESSMENTS - Ostomy and/or Continence Assessment and Care []  - 0 Incontinence Assessment and Management []  - 0 Ostomy Care Assessment and Management (repouching, etc.) PROCESS - Coordination of Care []  - 0 Simple Patient / Family Education for ongoing care []  - 0 Complex (extensive) Patient / Family Education for ongoing care []  - 0 Staff obtains Chiropractor, Records, T Results / Process Orders est []  - 0 Staff telephones HHA, Nursing Homes / Clarify orders / etc []  - 0 Routine Transfer to another Facility (non-emergent condition) []  - 0 Routine Hospital Admission (non-emergent condition) []  - 0 New Admissions / Manufacturing Engineer / Ordering NPWT Apligraf, etc. , []  - 0 Emergency Hospital Admission (emergent condition) PROCESS - Special Needs []  - 0 Pediatric / Minor Patient Management []  - 0 Isolation Patient Management []  - 0 Hearing / Language / Visual special needs []  - 0 Assessment of Community assistance (transportation, D/C planning, etc.) []  - 0 Additional assistance / Altered mentation []  - 0 Support Surface(s) Assessment (bed, cushion, seat, etc.) INTERVENTIONS - Miscellaneous []  - 0 External ear exam []  - 0 Patient Transfer (multiple staff / Nurse, Adult / Similar devices) []  - 0 Simple Staple / Suture removal (25 or less) []  - 0 Complex Staple / Suture removal (26 or more) []  - 0 Hypo/Hyperglycemic Management (do not check if billed separately) []  - 0 Ankle / Brachial Index (ABI) - do not check if billed separately Has the patient  been seen at the hospital within the last three years: Yes Total Score: 0 Level Of  Care: ____ Electronic Signature(s) Signed: 10/11/2023 4:11:19 PM By: Vaughn [name] Entered By: Vaughn [name] on 10/11/2023 10:55:33 -------------------------------------------------------------------------------- Encounter Discharge Information Details Patient Name: Date of Service: [name] [name]. 10/11/2023 9:45 A M Medical Record Number: [id number] Patient Account Number: [id number] Date of Birth/Sex: Treating RN: 21-May-1941 (83 y.o. [name] [name] Primary Care Rudra Hobbins: [place], [place] Other Clinician: Referring Yitzchak Kothari: Treating Derion Kreiter/Extender: Kimesha, [place], [place] [name] [id number] Page 3 of 9 Weeks in Treatment: 7 Encounter Discharge Information Items Post Procedure Vitals Discharge Condition: Stable Temperature (F): 97.6 Ambulatory Status: Ambulatory Pulse (bpm): 76 Discharge Destination: Home Respiratory Rate (breaths/min): 18 Transportation: Private Auto Blood Pressure (mmHg): 187/68 Accompanied By: self Schedule Follow-up Appointment: Yes Clinical Summary of Care: Electronic Signature(s) Signed: 10/11/2023 10:58:13 AM By: Vaughn [name] Entered By: Vaughn [name] on 10/11/2023 10:58:13 -------------------------------------------------------------------------------- Lower Extremity Assessment Details Patient Name: Date of Service: [name] [name] [name] [name] 10/11/2023 9:45 A M Medical Record Number: [id number] Patient Account Number: [id number] Date of Birth/Sex: Treating RN: 08-07-1941 (83 y.o. [name] [name] Primary Care Effie Janoski: PA [place], [place] Other Clinician: Referring Oasis Goehring: Treating Virga Haltiwanger/Extender: Bethena Andre Lenon Layman Devra in Treatment: 7 Edema Assessment Assessed: [Left: No] [Right: No] Edema: [Left: Ye] [Right: s] Calf Left: Right: Point of Measurement: 33 cm From Medial Instep 31.5 cm Ankle Left:  Right: Point of Measurement: 11 cm From Medial Instep 22 cm Vascular Assessment Pulses: Dorsalis Pedis Palpable: [Right:Yes] Extremity colors, hair growth, and conditions: Extremity Color: [Right:Normal] Hair Growth on Extremity: [Right:No] Temperature of Extremity: [Right:Warm < 3 seconds] Toe Nail Assessment Left: Right: Thick: No Discolored: No Deformed: No Improper Length and Hygiene: No Electronic Signature(s) Signed: 10/11/2023 4:11:19 PM By: Vaughn [name] [name], [name] [name] [id number] Page 4 of 9 Entered By: Vaughn [name] on 10/11/2023 10:03:52 -------------------------------------------------------------------------------- Multi Wound Chart Details Patient Name: Date of Service: [name] [name] [name] [name] [name] 10/11/2023 9:45 A M Medical Record Number: [id number] Patient Account Number: [id number] Date of Birth/Sex: Treating RN: 10/26/1940 (83 y.o. [name] [name] Primary Care Dossie Swor: PA [place], [place] Other Clinician: Referring Lillymae Duet: Treating Tyke Outman/Extender: Bethena Andre Lenon Layman Devra in Treatment: 7 Vital Signs Height(in): 62 Pulse(bpm): 76 Weight(lbs): 160 Blood Pressure(mmHg): 187/68 Body Mass Index(BMI): 29.3 Temperature(F): 97.6 Respiratory Rate(breaths/min): 20 [1:Photos:] [N/A:N/A] Right, Lateral Lower Leg N/A N/A Wound Location: Skin Tear/Laceration N/A N/A Wounding Event: Skin Tear N/A N/A Primary Etiology: Anemia, Asthma, Chronic Obstructive N/A N/A Comorbid History: Pulmonary Disease (COPD), Sleep Apnea, Arrhythmia, Congestive Heart Failure, Hypertension, Type II Diabetes, End Stage Renal Disease, Gout 08/21/2023 N/A N/A Date Acquired: 7 N/A N/A Weeks of Treatment: Open N/A N/A Wound Status: No N/A N/A Wound Recurrence: 2.7x2.5x0.1 N/A N/A Measurements L x W x D (cm) 5.301 N/A N/A A (cm) : rea 0.53 N/A N/A Volume (cm) : 82.20% N/A N/A % Reduction in Area: 96.40% N/A N/A % Reduction in  Volume: Full Thickness Without Exposed N/A N/A Classification: Support Structures Medium N/A N/A Exudate A mount: Serosanguineous N/A N/A Exudate Type: red, brown N/A N/A Exudate Color: Small (1-33%) N/A N/A Granulation A mount: Red, Hyper-granulation N/A N/A Granulation Quality: Large (67-100%) N/A N/A Necrotic A mount: Fat Layer (Subcutaneous Tissue): Yes N/A N/A Exposed Structures: None N/A N/A Epithelialization: Treatment Notes Electronic Signature(s) Signed: 10/11/2023 4:11:19 PM By: Vaughn [name] Entered By: Vaughn [name] on 10/11/2023 10:23:34 [place], [name] [name] [id number] Page 5 of 9 -------------------------------------------------------------------------------- Multi-Disciplinary Care Plan Details Patient Name: Date of Service: [name] [name] 10/11/2023 9:45 A M Medical Record Number:  [id number] Patient Account Number: [id number] Date of Birth/Sex: Treating RN: Jul 05, 1941 (83 y.o. [name] [name] Primary Care Denette Hass: [place], [place] Other Clinician: Referring Antoin Dargis: Treating Tonjia Parillo/Extender: Bethena Andre Lenon Layman Devra in Treatment: 7 Active Inactive Necrotic Tissue Nursing Diagnoses: Impaired tissue integrity related to necrotic/devitalized tissue Knowledge deficit related to management of necrotic/devitalized tissue Goals: Necrotic/devitalized tissue will be minimized in the wound bed Date Initiated: 08/23/2023 Target Resolution Date: 10/18/2023 Goal Status: Active Patient/caregiver will verbalize understanding of reason and process for debridement of necrotic tissue Date Initiated: 08/23/2023 Date Inactivated: 08/23/2023 Target Resolution Date: 08/23/2023 Goal Status: Met Interventions: Assess patient pain level pre-, during and post procedure and prior to discharge Provide education on necrotic tissue and debridement process Treatment Activities: Apply topical anesthetic as ordered :  08/23/2023 Excisional debridement : 08/23/2023 Notes: Wound/Skin Impairment Nursing Diagnoses: Impaired tissue integrity Knowledge deficit related to ulceration/compromised skin integrity Goals: Ulcer/skin breakdown will have a volume reduction of 30% by week 4 Date Initiated: 08/23/2023 Date Inactivated: 10/11/2023 Target Resolution Date: 09/20/2023 Goal Status: Met Ulcer/skin breakdown will have a volume reduction of 50% by week 8 Date Initiated: 08/23/2023 Target Resolution Date: 10/18/2023 Goal Status: Active Ulcer/skin breakdown will have a volume reduction of 80% by week 12 Date Initiated: 08/23/2023 Target Resolution Date: 11/15/2023 Goal Status: Active Ulcer/skin breakdown will heal within 14 weeks Date Initiated: 08/23/2023 Target Resolution Date: 11/29/2023 Goal Status: Active Interventions: Assess patient/caregiver ability to obtain necessary supplies Assess patient/caregiver ability to perform ulcer/skin care regimen upon admission and as needed Assess ulceration(s) every visit Provide education on ulcer and skin care BRAZIL, VOYTKO ([id number]) [phone].pdf Page 6 of 9 Notes: was not present for week 4 visit. 3 week visit 98.8% healed Electronic Signature(s) Signed: 10/11/2023 10:57:12 AM By: Vaughn [name] Previous Signature: 10/11/2023 10:57:03 AM Version By: Vaughn [name] Entered By: Vaughn [name] on 10/11/2023 10:57:12 -------------------------------------------------------------------------------- Pain Assessment Details Patient Name: Date of Service: [name] [name]. 10/11/2023 9:45 A M Medical Record Number: [id number] Patient Account Number: [id number] Date of Birth/Sex: Treating RN: [birth year] (83 y.o. [name] [name] Primary Care Grant Henkes: [place], [place] Other Clinician: Referring Ludene Stokke: Treating Clyde Zarrella/Extender: Bethena Andre Lenon Layman Devra in Treatment: 7 Active Problems Location of Pain Severity and Description  of Pain Patient Has Paino Yes Site Locations Rate the pain. Current Pain Level: 2 Pain Management and Medication Current Pain Management: Electronic Signature(s) Signed: 10/11/2023 4:11:19 PM By: Vaughn [name] Entered By: [name] [name] [name] [name] on 10/11/2023 09:58:56 [place], [name] [name] [phone].pdf Page 7 of 9 -------------------------------------------------------------------------------- Patient/Caregiver Education Details Patient Name: Date of Service: [name] [name] [name] [name] [name] 1/9/2025andnbsp9:45 A M Medical Record Number: [id number] Patient Account Number: [id number] Date of Birth/Gender: Treating RN: August 17, 1941 (83 y.o. [name] [name] Primary Care Physician: PA [place], [place] Other Clinician: Referring Physician: Treating Physician/Extender: Bethena Andre Lenon Layman Devra in Treatment: 7 Education Assessment Education Provided To: Patient Education Topics Provided Wound Debridement: Handouts: Wound Debridement Methods: Explain/Verbal Responses: State content correctly Wound/Skin Impairment: Handouts: Caring for Your Ulcer Methods: Explain/Verbal Responses: State content correctly Electronic Signature(s) Signed: 10/11/2023 4:11:19 PM By: Vaughn [name] Entered By: Vaughn [name] on 10/11/2023 10:57:26 -------------------------------------------------------------------------------- Wound Assessment Details Patient Name: Date of Service: [name] [name]. 10/11/2023 9:45 A M Medical Record Number: [id number] Patient Account Number: [id number] Date of Birth/Sex: Treating RN: 1941-05-23 (83 y.o. [name] [name] Primary Care Joie Reamer: [place], [place] Other Clinician: Referring Mohanad Carsten: Treating Bonnie Roig/Extender: Bethena Andre Lenon Layman Devra in Treatment: 7 Wound Status Wound Number: 1 Primary Skin T  ear Etiology: Wound Location: Right, Lateral Lower Leg Wound Open Wounding Event: Skin Tear/Laceration Status: Date Acquired:  08/21/2023 Comorbid Anemia, Asthma, Chronic Obstructive Pulmonary Disease (COPD), Weeks Of Treatment: 7 History: Sleep Apnea, Arrhythmia, Congestive Heart Failure, Hypertension, Clustered Wound: No Type II Diabetes, End Stage Renal Disease, Gout [name] [name] [name] [name] [phone].pdf Page 8 of 9 Photos Wound Measurements Length: (cm) 2.7 Width: (cm) 2.5 Depth: (cm) 0.1 Area: (cm) 5.301 Volume: (cm) 0.53 % Reduction in Area: 82.2% % Reduction in Volume: 96.4% Epithelialization: None Tunneling: No Undermining: No Wound Description Classification: Full Thickness Without Exposed Support Structures Exudate Amount: Medium Exudate Type: Serosanguineous Exudate Color: red, brown Foul Odor After Cleansing: No Slough/Fibrino Yes Wound Bed Granulation Amount: Small (1-33%) Exposed Structure Granulation Quality: Red, Hyper-granulation Fat Layer (Subcutaneous Tissue) Exposed: Yes Necrotic Amount: Large (67-100%) Necrotic Quality: Adherent Slough Treatment Notes Wound #1 (Lower Leg) Wound Laterality: Right, Lateral Cleanser Byram Ancillary Kit - 15 Day Supply Discharge Instruction: Use supplies as instructed; Kit contains: (15) Saline Bullets; (15) 3x3 Gauze; 15 pr Gloves Wound Cleanser Discharge Instruction: Wash your hands with soap and water. Remove old dressing, discard into plastic bag and place into trash. Cleanse the wound with Wound Cleanser prior to applying a clean dressing using gauze sponges, not tissues or cotton balls. Do not scrub or use excessive force. Pat dry using gauze sponges, not tissue or cotton balls. Peri-Wound Care Topical Primary Dressing Hydrofera Blue Ready Transfer Foam, 4x5 (in/in) Discharge Instruction: Apply Hydrofera Blue Ready to wound bed as directed Secondary Dressing ABD Pad 5x9 (in/in) Discharge Instruction: Cover with ABD pad (BORDER) Zetuvit Plus SILICONE BORDER Dressing 5x5 (in/in) Discharge Instruction: Please do  not put silicone bordered dressings under wraps. Use non-bordered dressing only. Secured With Medipore T - 11M Medipore H Soft Cloth Surgical T ape ape, 2x2 (in/yd) Kerlix Roll Sterile or Non-Sterile 6-ply 4.5x4 (yd/yd) Discharge Instruction: Apply Kerlix as directed Compression Wrap Compression Stockings Add-Ons Electronic Signature(s) Signed: 10/11/2023 4:11:19 PM By: Vaughn [name] Entered By: [name] [name] [name] [name] on 10/11/2023 10:03:06 [name] [name] [name] [name] [phone].pdf Page 9 of 9 -------------------------------------------------------------------------------- Vitals Details Patient Name: Date of Service: [name] [name] [name] 10/11/2023 9:45 A M Medical Record Number: [id number] Patient Account Number: [id number] Date of Birth/Sex: Treating RN: 10/28/1940 (83 y.o. [name] [name] Primary Care Sheretha Shadd: PA [place], [place] Other Clinician: Referring Gervis Gaba: Treating Blessing Ozga/Extender: Bethena Andre Lenon Layman Devra in Treatment: 7 Vital Signs Time Taken: 09:54 Temperature (F): 97.6 Height (in): 62 Pulse (bpm): 76 Weight (lbs): 160 Respiratory Rate (breaths/min): 20 Body Mass Index (BMI): 29.3 Blood Pressure (mmHg): 187/68 Reference Range: 80 - 120 mg / dl Notes pt states has not taken morning BP meds yet today Electronic Signature(s) Signed: 10/11/2023 4:11:19 PM By: Vaughn [name] Entered By: Vaughn [name] on 10/11/2023 09:58:47

## 2023-10-15 NOTE — Telephone Encounter (Signed)
 Per Sherlene Shams, she has requested a download from [employer].

## 2023-10-17 NOTE — Telephone Encounter (Signed)
 I have notified the patient. Nothing further needed.

## 2023-10-18 ENCOUNTER — Encounter: Payer: Medicare Other | Admitting: Physician Assistant

## 2023-10-18 DIAGNOSIS — E11622 Type 2 diabetes mellitus with other skin ulcer: Secondary | ICD-10-CM | POA: Diagnosis not present

## 2023-10-18 NOTE — Progress Notes (Signed)
YASMEENA, LEIPHART ([id number]) [id number] Page 1 of 8 Visit Report for 10/18/2023 Arrival Information Details Patient Name: Date of Service: GILDE, [place] 10/18/2023 9:45 A M Medical Record Number: [id number] Patient Account Number: [id number] Date of Birth/Sex: Treating RN: [birth year] (83 y.o. [name] Primary Care Ciella Obi: PA TIENT, [place] Other Clinician: Referring Trevian Hayashida: Treating Marena Witts/Extender: Johny Shears in Treatment: 8 Visit Information History Since Last Visit Added or deleted any medications: No Patient Arrived: Ambulatory Any new allergies or adverse reactions: No Arrival Time: 09:50 Had a fall or experienced change in No Accompanied By: self activities of daily living that may affect Transfer Assistance: None risk of falls: Patient Identification Verified: Yes Signs or symptoms of abuse/neglect since last visito No Secondary Verification Process Completed: Yes Hospitalized since last visit: No Patient Requires Transmission-Based Precautions: No Implantable device outside of the clinic excluding No Patient Has Alerts: Yes cellular tissue based products placed in the center Patient Alerts: Patient on Blood Thinner since last visit: Fistula LUE Pain Present Now: No type 2 diabetic Electronic Signature(s) Signed: 10/18/2023 4:05:16 PM By: Angelina Pih Entered By: Angelina Pih on 10/18/2023 09:51:23 -------------------------------------------------------------------------------- Clinic Level of Care Assessment Details Patient Name: Date of Service: ADALI, OMAN 10/18/2023 9:45 A M Medical Record Number: [id number] Patient Account Number: [id number] Date of Birth/Sex: Treating RN: 1941-01-11 (83 y.o. [name] Primary Care Javaria Knapke: PA Zenovia Jordan, [place] Other Clinician: Referring Tylan Briguglio: Treating Azizi Bally/Extender: Johny Shears in Treatment: 8 Clinic Level of Care  Assessment Items TOOL 1 Quantity Score []  - 0 Use when EandM and Procedure is performed on INITIAL visit ASSESSMENTS - Nursing Assessment / Reassessment []  - 0 General Physical Exam (combine w/ comprehensive assessment (listed just below) when performed on new pt. evals) []  - 0 Comprehensive Assessment (HX, ROS, Risk Assessments, Wounds Hx, etc.) ASSESSMENTS - Wound and Skin Assessment / Reassessment SHENEA, MUNSON ([id number]) [id number] Page 2 of 8 []  - 0 Dermatologic / Skin Assessment (not related to wound area) ASSESSMENTS - Ostomy and/or Continence Assessment and Care []  - 0 Incontinence Assessment and Management []  - 0 Ostomy Care Assessment and Management (repouching, etc.) PROCESS - Coordination of Care []  - 0 Simple Patient / Family Education for ongoing care []  - 0 Complex (extensive) Patient / Family Education for ongoing care []  - 0 Staff obtains Chiropractor, Records, T Results / Process Orders est []  - 0 Staff telephones HHA, Nursing Homes / Clarify orders / etc []  - 0 Routine Transfer to another Facility (non-emergent condition) []  - 0 Routine Hospital Admission (non-emergent condition) []  - 0 New Admissions / Manufacturing engineer / Ordering NPWT Apligraf, etc. , []  - 0 Emergency Hospital Admission (emergent condition) PROCESS - Special Needs []  - 0 Pediatric / Minor Patient Management []  - 0 Isolation Patient Management []  - 0 Hearing / Language / Visual special needs []  - 0 Assessment of Community assistance (transportation, D/C planning, etc.) []  - 0 Additional assistance / Altered mentation []  - 0 Support Surface(s) Assessment (bed, cushion, seat, etc.) INTERVENTIONS - Miscellaneous []  - 0 External ear exam []  - 0 Patient Transfer (multiple staff / Nurse, adult / Similar devices) []  - 0 Simple Staple / Suture removal (25 or less) []  - 0 Complex Staple / Suture removal (26 or more) []  - 0 Hypo/Hyperglycemic  Management (do not check if billed separately) []  - 0 Ankle / Brachial Index (ABI) - do not check if billed separately Has the patient been seen  at the hospital within the last three years: Yes Total Score: 0 Level Of Care: ____ Electronic Signature(s) Signed: 10/18/2023 4:05:16 PM By: Angelina Pih Entered By: Angelina Pih on 10/18/2023 10:24:09 -------------------------------------------------------------------------------- Encounter Discharge Information Details Patient Name: Date of Service: [name]. 10/18/2023 9:45 A M Medical Record Number: [id number] Patient Account Number: [id number] Date of Birth/Sex: Treating RN: 06/04/1941 (83 y.o. [name] Primary Care Chevella Pearce: PA Zenovia Jordan, [place] Other Clinician: Referring Avila Albritton: Treating Javionna Leder/Extender: Johny Shears in Treatment: [address] [place] E ([id number]) [id number] Page 3 of 8 Encounter Discharge Information Items Post Procedure Vitals Discharge Condition: Stable Temperature (F): 98.2 Ambulatory Status: Ambulatory Pulse (bpm): 65 Discharge Destination: Home Respiratory Rate (breaths/min): 18 Transportation: Private Auto Blood Pressure (mmHg): 188/51 Accompanied By: self Schedule Follow-up Appointment: Yes Clinical Summary of Care: Electronic Signature(s) Signed: 10/18/2023 4:05:16 PM By: Angelina Pih Entered By: Angelina Pih on 10/18/2023 10:25:10 -------------------------------------------------------------------------------- Lower Extremity Assessment Details Patient Name: Date of Service: LAKERIA, KNABEL 10/18/2023 9:45 A M Medical Record Number: [id number] Patient Account Number: [id number] Date of Birth/Sex: Treating RN: [birth year] (83 y.o. [name] Primary Care Zarah Carbon: PA Zenovia Jordan, [place] Other Clinician: Referring Areil Ottey: Treating Jewelianna Pancoast/Extender: Johny Shears in Treatment: 8 Edema Assessment Assessed:  [Left: No] [Right: No] Edema: [Left: N] [Right: o] Calf Left: Right: Point of Measurement: From Medial Instep 31 cm Ankle Left: Right: Point of Measurement: From Medial Instep 21.5 cm Vascular Assessment Pulses: Dorsalis Pedis Palpable: [Right:Yes] Extremity colors, hair growth, and conditions: Extremity Color: [Right:Normal] Hair Growth on Extremity: [Right:Yes] Temperature of Extremity: [Right:Warm < 3 seconds] Toe Nail Assessment Left: Right: Thick: No Discolored: No Deformed: No Improper Length and Hygiene: No Electronic Signature(s) Signed: 10/18/2023 4:05:16 PM By: Angelina Pih Entered By: Angelina Pih on 10/18/2023 09:57:22 Cupp, Alben Spittle ([id number]) [id number] Page 4 of 8 -------------------------------------------------------------------------------- Multi Wound Chart Details Patient Name: Date of Service: TELLIER, [place] 10/18/2023 9:45 A M Medical Record Number: [id number] Patient Account Number: [id number] Date of Birth/Sex: Treating RN: [birth year] (83 y.o. [name] Primary Care Lauris Serviss: PA TIENT, [place] Other Clinician: Referring Caroline Longie: Treating Tyria Springer/Extender: Johny Shears in Treatment: 8 Vital Signs Height(in): 62 Pulse(bpm): 65 Weight(lbs): 160 Blood Pressure(mmHg): 188/51 Body Mass Index(BMI): 29.3 Temperature(F): 98.2 Respiratory Rate(breaths/min): 17 [1:Photos:] [N/A:N/A] Right, Lateral Lower Leg N/A N/A Wound Location: Skin Tear/Laceration N/A N/A Wounding Event: Skin Tear N/A N/A Primary Etiology: Anemia, Asthma, Chronic Obstructive N/A N/A Comorbid History: Pulmonary Disease (COPD), Sleep Apnea, Arrhythmia, Congestive Heart Failure, Hypertension, Type II Diabetes, End Stage Renal Disease, Gout 08/21/2023 N/A N/A Date Acquired: 8 N/A N/A Weeks of Treatment: Open N/A N/A Wound Status: No N/A N/A Wound Recurrence: 2.3x2.2x0.1 N/A N/A Measurements L x W x D  (cm) 3.974 N/A N/A A (cm) : rea 0.397 N/A N/A Volume (cm) : 86.60% N/A N/A % Reduction in Area: 97.30% N/A N/A % Reduction in Volume: Full Thickness Without Exposed N/A N/A Classification: Support Structures Medium N/A N/A Exudate A mount: Serosanguineous N/A N/A Exudate Type: red, brown N/A N/A Exudate Color: Large (67-100%) N/A N/A Granulation A mount: Red, Hyper-granulation N/A N/A Granulation Quality: Small (1-33%) N/A N/A Necrotic A mount: Fat Layer (Subcutaneous Tissue): Yes N/A N/A Exposed Structures: Medium (34-66%) N/A N/A Epithelialization: Treatment Notes Electronic Signature(s) Signed: 10/18/2023 4:05:16 PM By: Angelina Pih Entered By: Angelina Pih on 10/18/2023 10:21:40 Dunnam, Alben Spittle ([id number]) [id number] Page 5 of 8 -------------------------------------------------------------------------------- Multi-Disciplinary Care Plan Details Patient Name: Date of Service:  Spiker, MA RY E. 10/18/2023 9:45 A M Medical Record Number: [id number] Patient Account Number: [id number] Date of Birth/Sex: Treating RN: 1941-09-21 (83 y.o. [name] Primary Care Mykia Holton: PA Zenovia Jordan, [place] Other Clinician: Referring Nichollas Perusse: Treating Samella Lucchetti/Extender: Johny Shears in Treatment: 8 Active Inactive Wound/Skin Impairment Nursing Diagnoses: Impaired tissue integrity Knowledge deficit related to ulceration/compromised skin integrity Goals: Ulcer/skin breakdown will have a volume reduction of 30% by week 4 Date Initiated: 08/23/2023 Date Inactivated: 10/11/2023 Target Resolution Date: 09/20/2023 Goal Status: Met Ulcer/skin breakdown will have a volume reduction of 50% by week 8 Date Initiated: 08/23/2023 Date Inactivated: 10/18/2023 Target Resolution Date: 10/18/2023 Goal Status: Met Ulcer/skin breakdown will have a volume reduction of 80% by week 12 Date Initiated: 08/23/2023 Target Resolution Date:  11/15/2023 Goal Status: Active Ulcer/skin breakdown will heal within 14 weeks Date Initiated: 08/23/2023 Target Resolution Date: 11/29/2023 Goal Status: Active Interventions: Assess patient/caregiver ability to obtain necessary supplies Assess patient/caregiver ability to perform ulcer/skin care regimen upon admission and as needed Assess ulceration(s) every visit Provide education on ulcer and skin care Notes: was not present for week 4 visit. 3 week visit 98.8% healed Electronic Signature(s) Signed: 10/18/2023 4:05:16 PM By: Angelina Pih Entered By: Angelina Pih on 10/18/2023 10:24:25 -------------------------------------------------------------------------------- Pain Assessment Details Patient Name: Date of Service: SAUNYA, LAUSIER 10/18/2023 9:45 A ELYSIUM, BEJAR ([id number]) [id number] Page 6 of 8 Medical Record Number: [id number] Patient Account Number: [id number] Date of Birth/Sex: Treating RN: 05/15/1941 (83 y.o. [name] Primary Care Pacen Watford: PA Zenovia [place], [place] Other Clinician: Referring Jaslin Novitski: Treating Laneka Mcgrory/Extender: Johny Shears in Treatment: 8 Active Problems Location of Pain Severity and Description of Pain Patient Has Paino No Site Locations With Dressing Change: No Rate the pain. Current Pain Level: 0 Pain Management and Medication Current Pain Management: Electronic Signature(s) Signed: 10/18/2023 4:05:16 PM By: Angelina Pih Entered By: Angelina Pih on 10/18/2023 09:52:05 -------------------------------------------------------------------------------- Patient/Caregiver Education Details Patient Name: Date of Service: Vivianne Master 1/16/2025andnbsp9:45 A M Medical Record Number: [id number] Patient Account Number: [id number] Date of Birth/Gender: Treating RN: 10-25-1940 (83 y.o. [name] Primary Care Physician: PA Zenovia Jordan, [place] Other Clinician: Referring Physician: Treating  Physician/Extender: Johny Shears in Treatment: 8 Education Assessment Education Provided To: Patient Education Topics Provided Wound/Skin Impairment: Handouts: Caring for Your Ulcer Methods: Explain/Verbal Responses: State content correctly LESLI, SWARBRICK ([id number]) [phone].pdf Page 7 of 8 Electronic Signature(s) Signed: 10/18/2023 4:05:16 PM By: Angelina Pih Entered By: Angelina Pih on 10/18/2023 10:24:34 -------------------------------------------------------------------------------- Wound Assessment Details Patient Name: Date of Service: ADALICIA, BOISSELLE 10/18/2023 9:45 A M Medical Record Number: [id number] Patient Account Number: [id number] Date of Birth/Sex: Treating RN: 01-02-1941 (83 y.o. [name] Primary Care Allanah Mcfarland: PA TIENT, [place] Other Clinician: Referring Norvella Loscalzo: Treating Babbie Dondlinger/Extender: Johny Shears in Treatment: 8 Wound Status Wound Number: 1 Primary Skin T ear Etiology: Wound Location: Right, Lateral Lower Leg Wound Open Wounding Event: Skin Tear/Laceration Status: Date Acquired: 08/21/2023 Comorbid Anemia, Asthma, Chronic Obstructive Pulmonary Disease (COPD), Weeks Of Treatment: 8 History: Sleep Apnea, Arrhythmia, Congestive Heart Failure, Hypertension, Clustered Wound: No Type II Diabetes, End Stage Renal Disease, Gout Photos Wound Measurements Length: (cm) 2.3 Width: (cm) 2.2 Depth: (cm) 0.1 Area: (cm) 3.974 Volume: (cm) 0.397 % Reduction in Area: 86.6% % Reduction in Volume: 97.3% Epithelialization: Medium (34-66%) Tunneling: No Undermining: No Wound Description Classification: Full Thickness Without Exposed Suppor Exudate Amount: Medium Exudate Type: Serosanguineous Exudate Color: red, brown t  Structures Foul Odor After Cleansing: No Slough/Fibrino Yes Wound Bed Granulation Amount: Large (67-100%) Exposed Structure Granulation Quality: Red,  Hyper-granulation Fat Layer (Subcutaneous Tissue) Exposed: Yes Necrotic Amount: Small (1-33%) Necrotic Quality: Adherent Slough Treatment Notes Wound #1 (Lower Leg) Wound Laterality: Right, Lateral Cleanser LORITTA, MICHALAK E ([id number]) [id number] Page 8 of 8 [employer] Ancillary Kit - 15 Day Supply Discharge Instruction: Use supplies as instructed; Kit contains: (15) Saline Bullets; (15) 3x3 Gauze; 15 pr Gloves Wound Cleanser Discharge Instruction: Wash your hands with soap and water. Remove old dressing, discard into plastic bag and place into trash. Cleanse the wound with Wound Cleanser prior to applying a clean dressing using gauze sponges, not tissues or cotton balls. Do not scrub or use excessive force. Pat dry using gauze sponges, not tissue or cotton balls. Peri-Wound Care Triamcinolone Acetonide Cream, 0.1%, 15 (g) tube Discharge Instruction: Apply as directed. Topical Primary Dressing Hydrofera Blue Ready Transfer Foam, 4x5 (in/in) Discharge Instruction: Apply Hydrofera Blue Ready to wound bed as directed Secondary Dressing ABD Pad 5x9 (in/in) Discharge Instruction: Cover with ABD pad (BORDER) Zetuvit Plus SILICONE BORDER Dressing 5x5 (in/in) Discharge Instruction: Please do not put silicone bordered dressings under wraps. Use non-bordered dressing only. Secured With Medipore T - 30M Medipore H Soft Cloth Surgical T ape ape, 2x2 (in/yd) Kerlix Roll Sterile or Non-Sterile 6-ply 4.5x4 (yd/yd) Discharge Instruction: Apply Kerlix as directed Compression Wrap Compression Stockings Add-Ons Electronic Signature(s) Signed: 10/18/2023 4:05:16 PM By: Angelina Pih Entered By: Angelina Pih on 10/18/2023 09:57:11 -------------------------------------------------------------------------------- Vitals Details Patient Name: Date of Service: Galen Manila, [name] E. 10/18/2023 9:45 A M Medical Record Number: [id number] Patient Account Number: [id number] Date of  Birth/Sex: Treating RN: 03/02/1941 (83 y.o. [name] Primary Care Gaylin Osoria: PA Zenovia Jordan, [place] Other Clinician: Referring Kresha Abelson: Treating Alyric Parkin/Extender: Johny Shears in Treatment: 8 Vital Signs Time Taken: 09:51 Temperature (F): 98.2 Height (in): 62 Pulse (bpm): 65 Weight (lbs): 160 Respiratory Rate (breaths/min): 17 Body Mass Index (BMI): 29.3 Blood Pressure (mmHg): 188/51 Reference Range: 80 - 120 mg / dl Electronic Signature(s) Signed: 10/18/2023 4:05:16 PM By: Angelina Pih Entered By: Angelina Pih on 10/18/2023 09:51:55

## 2023-10-18 NOTE — Progress Notes (Addendum)
LORAN, RIGOLI ([id number]) [id number] Page 1 of 8 Visit Report for 10/18/2023 Chief Complaint Document Details Patient Name: Date of Service: [name], [name] [place] 10/18/2023 9:45 A M Medical Record Number: [id number] Patient Account Number: [id number] Date of Birth/Sex: Treating RN: March 11, 1941 (83 y.o. [name] Primary Care Provider: PA Zenovia Jordan, [place] Other Clinician: Referring Provider: Treating Provider/Extender: Johny Shears in Treatment: 8 Information Obtained from: Patient Chief Complaint Right LE Ulcer Electronic Signature(s) Signed: 10/18/2023 9:58:47 AM By: Allen Derry PA-C Entered By: Allen Derry on 10/18/2023 09:58:47 -------------------------------------------------------------------------------- Debridement Details Patient Name: Date of Service: [name] Broach [name]. 10/18/2023 9:45 A M Medical Record Number: [id number] Patient Account Number: [id number] Date of Birth/Sex: Treating RN: [birth year] (83 y.o. [name] Primary Care Provider: PA Zenovia Jordan, [place] Other Clinician: Referring Provider: Treating Provider/Extender: Johny Shears in Treatment: 8 Debridement Performed for Assessment: Wound #1 Right,Lateral Lower Leg Performed By: Physician Allen Derry, PA-C The following information was scribed by: Angelina Pih The information was scribed for: Allen Derry Debridement Type: Debridement Level of Consciousness (Pre-procedure): Awake and Alert Pre-procedure Verification/Time Out Yes - 10:22 Taken: Pain Control: Lidocaine 4% T opical Solution Percent of Wound Bed Debrided: 100% T Area Debrided (cm): otal 3.97 Tissue and other material debrided: Viable, Non-Viable, Slough, Subcutaneous, Biofilm, Slough Level: Skin/Subcutaneous Tissue Debridement Description: Excisional Instrument: Curette Bleeding: Moderate Hemostasis Achieved: Pressure Response to Treatment: Procedure was tolerated  well Level of Consciousness Arlie SolomonsNAJAH, MOALA ([id number]) [id number] Page 2 of 8 Level of Consciousness (Post- Awake and Alert procedure): Post Debridement Measurements of Total Wound Length: (cm) 2.3 Width: (cm) 2.2 Depth: (cm) 0.1 Volume: (cm) 0.397 Character of Wound/Ulcer Post Debridement: Stable Post Procedure Diagnosis Same as Pre-procedure Electronic Signature(s) Signed: 10/18/2023 4:05:16 PM By: Angelina Pih Signed: 10/18/2023 6:05:15 PM By: Allen Derry PA-C Entered By: Angelina Pih on 10/18/2023 10:23:10 -------------------------------------------------------------------------------- HPI Details Patient Name: Date of Service: [place], [name] Rector [name]. 10/18/2023 9:45 A M Medical Record Number: [id number] Patient Account Number: [id number] Date of Birth/Sex: Treating RN: 1941-09-01 (83 y.o. [name] Primary Care Provider: PA TIENT, [place] Other Clinician: Referring Provider: Treating Provider/Extender: Johny Shears in Treatment: 8 History of Present Illness HPI Description: 08-23-2023 upon evaluation today patient presents for initial inspection here in our clinic due to an issue that she has been having with a skin tear of the right lateral lower extremity where she tells me that a storm door was going back into her leg causing a significant skin tear. With her leg hurting she unfortunately noted around July 23, 2023 that she was having a regular heart rate where it was jumping up going back down and it was so erratic and irregular she ended up calling an ambulance to take her to the hospital this ended up with a hospital stent that went from the July 23, 2023 through August 06, 2023. During the course of that hospital stay on 08-02-2023 she had an OR debridement it sounds like she may have had a significant hematoma based on what I am seeing and hearing. Following that time she has been using some Xeroform here  and there and that has helped to some degree right now she tells me she has not been using that as she did not have any more of that at this point. She did have an x-ray on 07-30-2023 which was negative for any signs of fracture or acute bony abnormality. Patient does have a history of diabetes  mellitus type 2, hypertension, COPD, chronic kidney disease stage IV, atrial fibrillation, long-term use of anticoagulant therapy. 10-18-2023 upon evaluation today patient appears to be doing well currently in regard to her wound. She has been tolerating the dressing changes without complication and little by little to get this wound much smaller. I did perform debridement today to clearway necrotic debris she tolerated this debridement without complication and postdebridement the wound that is significantly improved. Electronic Signature(s) Signed: 10/18/2023 5:23:14 PM By: Allen Derry PA-C Entered By: Allen Derry on 10/18/2023 17:23:14 Corney, [name] ([id number]) [id number] Page 3 of 8 -------------------------------------------------------------------------------- Physical Exam Details Patient Name: Date of Service: [name], [name] 10/18/2023 9:45 A M Medical Record Number: [id number] Patient Account Number: [id number] Date of Birth/Sex: Treating RN: [birth year] (83 y.o. [name] Primary Care Provider: PA Zenovia Jordan, [place] Other Clinician: Referring Provider: Treating Provider/Extender: Johny Shears in Treatment: 8 Constitutional Well-nourished and well-hydrated in no acute distress. Respiratory normal breathing without difficulty. Psychiatric this patient is able to make decisions and demonstrates good insight into disease process. Alert and Oriented x 3. pleasant and cooperative. Notes Patient's wound currently again postdebridement looks much better there was some bleeding I was able to control this with pressure postdebridement however the  wound is dramatically improved and I think little by little while getting this closed. Electronic Signature(s) Signed: 10/18/2023 5:23:26 PM By: Allen Derry PA-C Entered By: Allen Derry on 10/18/2023 17:23:26 -------------------------------------------------------------------------------- Physician Orders Details Patient Name: Date of Service: [name] Broach [name]. 10/18/2023 9:45 A M Medical Record Number: [id number] Patient Account Number: [id number] Date of Birth/Sex: Treating RN: Dec 26, 1940 (83 y.o. [name] Primary Care Provider: PA Zenovia Jordan, [place] Other Clinician: Referring Provider: Treating Provider/Extender: Johny Shears in Treatment: 8 The following information was scribed by: Angelina Pih The information was scribed for: Allen Derry Verbal / Phone Orders: No Diagnosis Coding ICD-10 Coding Code Description S81.811A Laceration without foreign body, right lower leg, initial encounter E11.622 Type 2 diabetes mellitus with other skin ulcer L97.812 Non-pressure chronic ulcer of other part of right lower leg with fat layer exposed I10 Essential (primary) hypertension J44.89 Other specified chronic obstructive pulmonary disease [name], [name] ([id number]) [id number] Page 4 of 8 N18.4 Chronic kidney disease, stage 4 (severe) I48.0 Paroxysmal atrial fibrillation Z79.01 Long term (current) use of anticoagulants Follow-up Appointments Return Appointment in 1 week. Bathing/ Shower/ Hygiene May shower; gently cleanse wound with antibacterial soap, rinse and pat dry prior to dressing wounds - Use Dial original GOLD soap to clean wound after showering No tub bath. Anesthetic (Use 'Patient Medications' Section for Anesthetic Order Entry) Lidocaine applied to wound bed Edema Control - Orders / Instructions Elevate, Exercise Daily and A void Standing for Long Periods of Time. Elevate legs to the level of the heart and pump ankles as  often as possible Elevate leg(s) parallel to the floor when sitting. DO YOUR BEST to sleep in the bed at night. DO NOT sleep in your recliner. Long hours of sitting in a recliner leads to swelling of the legs and/or potential wounds on your backside. Wound Treatment Wound #1 - Lower Leg Wound Laterality: Right, Lateral Cleanser: Byram Ancillary Kit - 15 Day Supply (Generic) 3 x Per Week/15 Days Discharge Instructions: Use supplies as instructed; Kit contains: (15) Saline Bullets; (15) 3x3 Gauze; 15 pr Gloves Cleanser: Wound Cleanser 3 x Per Week/15 Days Discharge Instructions: Wash your hands with soap and water. Remove old dressing, discard into plastic bag  and place into trash. Cleanse the wound with Wound Cleanser prior to applying a clean dressing using gauze sponges, not tissues or cotton balls. Do not scrub or use excessive force. Pat dry using gauze sponges, not tissue or cotton balls. Peri-Wound Care: Triamcinolone Acetonide Cream, 0.1%, 15 (g) tube 3 x Per Week/15 Days Discharge Instructions: Apply as directed. Prim Dressing: Hydrofera Blue Ready Transfer Foam, 4x5 (in/in) (Generic) 3 x Per Week/15 Days ary Discharge Instructions: Apply Hydrofera Blue Ready to wound bed as directed Secondary Dressing: ABD Pad 5x9 (in/in) 3 x Per Week/15 Days Discharge Instructions: Cover with ABD pad Secured With: Medipore T - 59M Medipore H Soft Cloth Surgical T ape ape, 2x2 (in/yd) 3 x Per Week/15 Days Secured With: [employer] or Non-Sterile 6-ply 4.5x4 (yd/yd) 3 x Per Week/15 Days Discharge Instructions: Apply Kerlix as directed Patient Medications llergies: ACE Inhibitors, egg, prednisone, risedronate sodium, Sulfa (Sulfonamide Antibiotics), sulfasalazine A Notifications Medication Indication Start End 10/18/2023 triamcinolone acetonide DOSE topical 0.1 % ointment - ointment topical applied to the skin around the wound with each dressing change 3 times per week to help with  irritation and itching x 30day Electronic Signature(s) Signed: 10/18/2023 5:25:19 PM By: Allen Derry PA-C Previous Signature: 10/18/2023 4:05:16 PM Version By: Angelina Pih Entered By: Allen Derry on 10/18/2023 17:25:18 Rallis, [name] ([id number]) [id number] Page 5 of 8 -------------------------------------------------------------------------------- Problem List Details Patient Name: Date of Service: [name], [place] 10/18/2023 9:45 A M Medical Record Number: [id number] Patient Account Number: [id number] Date of Birth/Sex: Treating RN: Jan 02, 1941 (83 y.o. [name] Primary Care Provider: PA Zenovia Jordan, [place] Other Clinician: Referring Provider: Treating Provider/Extender: Johny Shears in Treatment: 8 Active Problems ICD-10 Encounter Code Description Active Date MDM Diagnosis S81.811A Laceration without foreign body, right lower leg, initial encounter 08/23/2023 No Yes E11.622 Type 2 diabetes mellitus with other skin ulcer 08/23/2023 No Yes L97.812 Non-pressure chronic ulcer of other part of right lower leg with fat layer 08/23/2023 No Yes exposed I10 Essential (primary) hypertension 08/23/2023 No Yes J44.89 Other specified chronic obstructive pulmonary disease 08/23/2023 No Yes N18.4 Chronic kidney disease, stage 4 (severe) 08/23/2023 No Yes I48.0 Paroxysmal atrial fibrillation 08/23/2023 No Yes Z79.01 Long term (current) use of anticoagulants 08/23/2023 No Yes Inactive Problems Resolved Problems Electronic Signature(s) Signed: 10/18/2023 9:58:19 AM By: Allen Derry PA-C Entered By: Allen Derry on 10/18/2023 09:58:19 -------------------------------------------------------------------------------- Progress Note Details Patient Name: Date of Service: Simonich, [name] Rector [name]. 10/18/2023 9:45 A M Medical Record Number: [id number] Patient Account Number: [id number] [name], [name] ([id number]) [phone].pdf Page 6 of  8 Date of Birth/Sex: Treating RN: 05-14-1941 (83 y.o. [name] Primary Care Provider: Other Clinician: PA Zenovia Jordan, [place] Referring Provider: Treating Provider/Extender: Johny Shears in Treatment: 8 Subjective Chief Complaint Information obtained from Patient Right LE Ulcer History of Present Illness (HPI) 08-23-2023 upon evaluation today patient presents for initial inspection here in our clinic due to an issue that she has been having with a skin tear of the right lateral lower extremity where she tells me that a storm door was going back into her leg causing a significant skin tear. With her leg hurting she unfortunately noted around July 23, 2023 that she was having a regular heart rate where it was jumping up going back down and it was so erratic and irregular she ended up calling an ambulance to take her to the hospital this ended up with a hospital stent that went from the July 23, 2023 through August 06, 2023. During the course of that hospital stay on 08-02-2023 she had an OR debridement it sounds like she may have had a significant hematoma based on what I am seeing and hearing. Following that time she has been using some Xeroform here and there and that has helped to some degree right now she tells me she has not been using that as she did not have any more of that at this point. She did have an x-ray on 07-30-2023 which was negative for any signs of fracture or acute bony abnormality. Patient does have a history of diabetes mellitus type 2, hypertension, COPD, chronic kidney disease stage IV, atrial fibrillation, long-term use of anticoagulant therapy. 10-18-2023 upon evaluation today patient appears to be doing well currently in regard to her wound. She has been tolerating the dressing changes without complication and little by little to get this wound much smaller. I did perform debridement today to clearway necrotic debris she tolerated this  debridement without complication and postdebridement the wound that is significantly improved. Objective Constitutional Well-nourished and well-hydrated in no acute distress. Vitals Time Taken: 9:51 AM, Height: 62 in, Weight: 160 lbs, BMI: 29.3, Temperature: 98.2 F, Pulse: 65 bpm, Respiratory Rate: 17 breaths/min, Blood Pressure: 188/51 mmHg. Respiratory normal breathing without difficulty. Psychiatric this patient is able to make decisions and demonstrates good insight into disease process. Alert and Oriented x 3. pleasant and cooperative. General Notes: Patient's wound currently again postdebridement looks much better there was some bleeding I was able to control this with pressure postdebridement however the wound is dramatically improved and I think little by little while getting this closed. Integumentary (Hair, Skin) Wound #1 status is Open. Original cause of wound was Skin T ear/Laceration. The date acquired was: 08/21/2023. The wound has been in treatment 8 weeks. The wound is located on the Right,Lateral Lower Leg. The wound measures 2.3cm length x 2.2cm width x 0.1cm depth; 3.974cm^2 area and 0.397cm^3 volume. There is Fat Layer (Subcutaneous Tissue) exposed. There is no tunneling or undermining noted. There is a medium amount of serosanguineous drainage noted. There is large (67-100%) red, hyper - granulation within the wound bed. There is a small (1-33%) amount of necrotic tissue within the wound bed including Adherent Slough. Assessment Active Problems ICD-10 Laceration without foreign body, right lower leg, initial encounter Type 2 diabetes mellitus with other skin ulcer Non-pressure chronic ulcer of other part of right lower leg with fat layer exposed Essential (primary) hypertension Other specified chronic obstructive pulmonary disease Chronic kidney disease, stage 4 (severe) Paroxysmal atrial fibrillation Long term (current) use of anticoagulants Procedures NEAL, ROMANCHUK [name] ([id number]) [id number] Page 7 of 8 Wound #1 Pre-procedure diagnosis of Wound #1 is a Skin T located on the Right,Lateral Lower Leg . There was a Excisional Skin/Subcutaneous Tissue Debridement ear with a total area of 3.97 sq cm performed by Allen Derry, PA-C. With the following instrument(s): Curette to remove Viable and Non-Viable tissue/material. Material removed includes Subcutaneous Tissue, Slough, and Biofilm after achieving pain control using Lidocaine 4% T opical Solution. No specimens were taken. A time out was conducted at 10:22, prior to the start of the procedure. A Moderate amount of bleeding was controlled with Pressure. The procedure was tolerated well. Post Debridement Measurements: 2.3cm length x 2.2cm width x 0.1cm depth; 0.397cm^3 volume. Character of Wound/Ulcer Post Debridement is stable. Post procedure Diagnosis Wound #1: Same as Pre-Procedure Plan Follow-up Appointments: Return Appointment in 1 week. Bathing/ Shower/ Hygiene: May shower;  gently cleanse wound with antibacterial soap, rinse and pat dry prior to dressing wounds - Use Dial original GOLD soap to clean wound after showering No tub bath. Anesthetic (Use 'Patient Medications' Section for Anesthetic Order Entry): Lidocaine applied to wound bed Edema Control - Orders / Instructions: Elevate, Exercise Daily and Avoid Standing for Long Periods of Time. Elevate legs to the level of the heart and pump ankles as often as possible Elevate leg(s) parallel to the floor when sitting. DO YOUR BEST to sleep in the bed at night. DO NOT sleep in your recliner. Long hours of sitting in a recliner leads to swelling of the legs and/or potential wounds on your backside. The following medication(s) was prescribed: triamcinolone acetonide topical 0.1 % ointment ointment topical applied to the skin around the wound with each dressing change 3 times per week to help with irritation and itching x  30day starting 10/18/2023 WOUND #1: - Lower Leg Wound Laterality: Right, Lateral Cleanser: Byram Ancillary Kit - 15 Day Supply (Generic) 3 x Per Week/15 Days Discharge Instructions: Use supplies as instructed; Kit contains: (15) Saline Bullets; (15) 3x3 Gauze; 15 pr Gloves Cleanser: Wound Cleanser 3 x Per Week/15 Days Discharge Instructions: Wash your hands with soap and water. Remove old dressing, discard into plastic bag and place into trash. Cleanse the wound with Wound Cleanser prior to applying a clean dressing using gauze sponges, not tissues or cotton balls. Do not scrub or use excessive force. Pat dry using gauze sponges, not tissue or cotton balls. Peri-Wound Care: Triamcinolone Acetonide Cream, 0.1%, 15 (g) tube 3 x Per Week/15 Days Discharge Instructions: Apply as directed. Prim Dressing: Hydrofera Blue Ready Transfer Foam, 4x5 (in/in) (Generic) 3 x Per Week/15 Days ary Discharge Instructions: Apply Hydrofera Blue Ready to wound bed as directed Secondary Dressing: ABD Pad 5x9 (in/in) 3 x Per Week/15 Days Discharge Instructions: Cover with ABD pad Secured With: Medipore T - 74M Medipore H Soft Cloth Surgical T ape ape, 2x2 (in/yd) 3 x Per Week/15 Days Secured With: [employer] Sterile or Non-Sterile 6-ply 4.5x4 (yd/yd) 3 x Per Week/15 Days Discharge Instructions: Apply Kerlix as directed 1. Due to some itching around her leg will use some triamcinolone at the area and I am going to send her in a prescription for this as well. She is in agreement with that plan. We will subsequently see how things do in the next week following this debridement today. 2. I am going to recommend as well that we have the patient continue with the [facility] which I think is really doing quite well. 3 and also can recommend ABD pad to cover followed by roll gauze to secure in place. We will see patient back for reevaluation in 1 week here in the clinic. If anything worsens or changes patient will  contact our office for additional recommendations. Electronic Signature(s) Signed: 10/18/2023 5:25:54 PM By: Allen Derry PA-C Entered By: Allen Derry on 10/18/2023 17:25:54 -------------------------------------------------------------------------------- SuperBill Details Patient Name: Date of Service: [place], [name] 10/18/2023 Medical Record Number: [id number] Patient Account Number: [id number] Date of Birth/Sex: Treating RN: [birth year] (83 y.o. [name], [name], [place] [name] ([id number]) [id number] Page 8 of 8 Primary Care Provider: PA TIENT, [place] Other Clinician: Referring Provider: Treating Provider/Extender: Johny Shears in Treatment: 8 Diagnosis Coding ICD-10 Codes Code Description [phone] Laceration without foreign body, right lower leg, initial encounter E11.622 Type 2 diabetes mellitus with other skin ulcer L97.812 Non-pressure chronic ulcer of other part of right  lower leg with fat layer exposed I10 Essential (primary) hypertension J44.89 Other specified chronic obstructive pulmonary disease N18.4 Chronic kidney disease, stage 4 (severe) I48.0 Paroxysmal atrial fibrillation Z79.01 Long term (current) use of anticoagulants Facility Procedures : CPT4 Code: [id number] Description: 11042 - DEB SUBQ TISSUE 20 SQ CM/< ICD-10 Diagnosis Description L97.812 Non-pressure chronic ulcer of other part of right lower leg with fat layer exp Modifier: osed Quantity: 1 Physician Procedures : CPT4 Code Description Modifier [id number] - WC PHYS LEVEL 4 - EST PT 25 ICD-10 Diagnosis Description S81.811A Laceration without foreign body, right lower leg, initial encounter E11.622 Type 2 diabetes mellitus with other skin ulcer L97.812  Non-pressure chronic ulcer of other part of right lower leg with fat layer exposed I10 Essential (primary) hypertension Quantity: 1 : 11042 11042 - WC PHYS SUBQ TISS 20 SQ CM ICD-10 Diagnosis Description  L97.812 Non-pressure chronic ulcer of other part of right lower leg with fat layer exposed Quantity: 1 Electronic Signature(s) Signed: 10/18/2023 5:27:51 PM By: Allen Derry PA-C Entered By: Allen Derry on 10/18/2023 17:27:51

## 2023-10-22 ENCOUNTER — Inpatient Hospital Stay: Payer: Medicare Other | Attending: Internal Medicine

## 2023-10-22 ENCOUNTER — Inpatient Hospital Stay (HOSPITAL_BASED_OUTPATIENT_CLINIC_OR_DEPARTMENT_OTHER): Payer: Medicare Other | Admitting: Internal Medicine

## 2023-10-22 ENCOUNTER — Inpatient Hospital Stay: Payer: Medicare Other

## 2023-10-22 ENCOUNTER — Encounter: Payer: Self-pay | Admitting: Internal Medicine

## 2023-10-22 VITALS — BP 160/62 | HR 72 | Temp 99.7°F | Resp 14 | Wt 162.0 lb

## 2023-10-22 DIAGNOSIS — D649 Anemia, unspecified: Secondary | ICD-10-CM | POA: Diagnosis not present

## 2023-10-22 DIAGNOSIS — I509 Heart failure, unspecified: Secondary | ICD-10-CM | POA: Diagnosis not present

## 2023-10-22 DIAGNOSIS — D631 Anemia in chronic kidney disease: Secondary | ICD-10-CM

## 2023-10-22 DIAGNOSIS — N185 Chronic kidney disease, stage 5: Secondary | ICD-10-CM | POA: Diagnosis not present

## 2023-10-22 DIAGNOSIS — N184 Chronic kidney disease, stage 4 (severe): Secondary | ICD-10-CM

## 2023-10-22 DIAGNOSIS — N390 Urinary tract infection, site not specified: Secondary | ICD-10-CM | POA: Diagnosis not present

## 2023-10-22 DIAGNOSIS — I130 Hypertensive heart and chronic kidney disease with heart failure and stage 1 through stage 4 chronic kidney disease, or unspecified chronic kidney disease: Secondary | ICD-10-CM | POA: Insufficient documentation

## 2023-10-22 LAB — CBC WITH DIFFERENTIAL/PLATELET
Abs Immature Granulocytes: 0.02 10*3/uL (ref 0.00–0.07)
Basophils Absolute: 0 10*3/uL (ref 0.0–0.1)
Basophils Relative: 1 %
Eosinophils Absolute: 0.2 10*3/uL (ref 0.0–0.5)
Eosinophils Relative: 3 %
HCT: 28.5 % — ABNORMAL LOW (ref 36.0–46.0)
Hemoglobin: 9.1 g/dL — ABNORMAL LOW (ref 12.0–15.0)
Immature Granulocytes: 0 %
Lymphocytes Relative: 37 %
Lymphs Abs: 2.4 10*3/uL (ref 0.7–4.0)
MCH: 27.7 pg (ref 26.0–34.0)
MCHC: 31.9 g/dL (ref 30.0–36.0)
MCV: 86.9 fL (ref 80.0–100.0)
Monocytes Absolute: 0.5 10*3/uL (ref 0.1–1.0)
Monocytes Relative: 8 %
Neutro Abs: 3.3 10*3/uL (ref 1.7–7.7)
Neutrophils Relative %: 51 %
Platelets: 176 10*3/uL (ref 150–400)
RBC: 3.28 MIL/uL — ABNORMAL LOW (ref 3.87–5.11)
RDW: 15.9 % — ABNORMAL HIGH (ref 11.5–15.5)
WBC: 6.4 10*3/uL (ref 4.0–10.5)
nRBC: 0 % (ref 0.0–0.2)

## 2023-10-22 MED ORDER — ESOMEPRAZOLE MAGNESIUM 40 MG PO CPDR: 40.0000 mg | DELAYED_RELEASE_CAPSULE | Freq: Every day | ORAL | 0 refills | Status: DC

## 2023-10-22 MED ORDER — DARBEPOETIN ALFA 150 MCG/0.3ML IJ SOSY
150.0000 ug | PREFILLED_SYRINGE | Freq: Once | INTRAMUSCULAR | Status: AC
Start: 2023-10-22 — End: 2023-10-22
  Administered 2023-10-22: 150 ug via SUBCUTANEOUS
  Filled 2023-10-22: qty 0.3

## 2023-10-22 NOTE — Progress Notes (Unsigned)
Patient was dismissed from her recent PCP from [facility], for unknown reasons. Her power of attorney (niece), has her an appointment with a new PCP next Tuesday. She is about out of refills on her Nexium and wanted to know if you could maybe refill it for her once before she can get to her new doctor? She states that her blood pressure has been reading very high (160/80), and today it was 160/62). She has also been getting really dizzy.

## 2023-10-23 ENCOUNTER — Encounter: Payer: Self-pay | Admitting: Internal Medicine

## 2023-10-23 NOTE — Progress Notes (Signed)
[facility]  [phone] [phone]   ID: Shauntel Keefner Andonian OB: 1941-05-05  MR#: [id number]  QIO#:[id number]  Patient Care Team: Patient, No Pcp Per as PCP - General (General Practice) Michaelyn Barter, MD as Consulting Physician (Oncology)  REFERRING PROVIDER: Dr. Cherylann Ratel  REASON FOR REFERRAL: anemia of CKD  HPI: [name] is a 83 y.o. female with past medical history of anemia, skin cancer, CHF, diabetes, GERD, hypertension, hyperlipidemia, hypothyroidism, CKD stage IV, OSA, osteoporosis referred to hematology for management of normocytic anemia.  Patient was seen today accompanied by her niece.  She follows with Dr. Cherylann Ratel.  Has history of CKD stage IV which has been attributed to her chronic hypertension and prior diabetes.  SPEP/IFE checked in 2022 was negative.  Reports feeling fatigue, shortness of breath on exertion and chest pain for a year now which has been progressively worse.  Hemoglobin from November 2024 was 7.3.  Started on Aranesp 200 mcg on 08/28/2023.  Hemoglobin today has improved to 10.3 just with the 1 injection.  Interval history Patient was seen today with niece for labs and Aranesp. Patient reports significantly better with Aranesp injection.  She is able to drive short distances, able to do laundry and household course without getting tired.  Her urinary symptoms have resolved after antibiotic.  REVIEW OF SYSTEMS:   Review of Systems  Constitutional:  Positive for malaise/fatigue.    As per HPI. Otherwise, a complete review of systems is negative.  PAST MEDICAL HISTORY: Past Medical History:  Diagnosis Date   Actinic keratosis    Albuminuria    Anemia    Arthritis    Asthma    Basal cell carcinoma 04/12/2017   Above right lateral brow. Nodulocystic type. EDC   Cancer (HCC)    skin   Cataract cortical, senile    CHF (congestive heart failure) (HCC)    Diabetes mellitus without complication (HCC)    GERD  (gastroesophageal reflux disease)    Hemorrhoids    History of kidney stones    Hyperlipidemia    Hypertension    Hypothyroidism    Lyme disease    No kidney function    OSA (obstructive sleep apnea)    Osteoporosis    Osteoporosis    Reflux esophagitis    Steatohepatitis    Steatohepatitis     PAST SURGICAL HISTORY: Past Surgical History:  Procedure Laterality Date   ABDOMINAL HYSTERECTOMY     APPENDECTOMY     AV FISTULA PLACEMENT Left 05/19/2022   Procedure: ARTERIOVENOUS (AV) FISTULA CREATION ( BRACHIAL CEPHALIC );  Surgeon: Renford Dills, MD;  Location: [facility] ORS;  Service: Vascular;  Laterality: Left;   CARDIAC CATHETERIZATION  1980   [facility]   CARDIAC CATHETERIZATION  08/13/2014   [facility]. no significant CAD, normal LVEDP.    CATARACT EXTRACTION     CHOLECYSTECTOMY     COLONOSCOPY     COLONOSCOPY WITH PROPOFOL N/A 12/07/2016   Procedure: COLONOSCOPY WITH PROPOFOL;  Surgeon: Christena Deem, MD;  Location: [facility] ENDOSCOPY;  Service: Endoscopy;  Laterality: N/A;   ESOPHAGOGASTRODUODENOSCOPY     ESOPHAGOGASTRODUODENOSCOPY (EGD) WITH PROPOFOL N/A 12/07/2016   Procedure: ESOPHAGOGASTRODUODENOSCOPY (EGD) WITH PROPOFOL;  Surgeon: Christena Deem, MD;  Location: [facility] ENDOSCOPY;  Service: Endoscopy;  Laterality: N/A;   ESOPHAGOGASTRODUODENOSCOPY (EGD) WITH PROPOFOL N/A 01/07/2018   Procedure: ESOPHAGOGASTRODUODENOSCOPY (EGD) WITH PROPOFOL;  Surgeon: Christena Deem, MD;  Location: [facility] ENDOSCOPY;  Service: Endoscopy;  Laterality: N/A;   EYE SURGERY  HEMATOMA EVACUATION Right 08/02/2023   Procedure: EVACUATION HEMATOMA;  Surgeon: Leafy Ro, MD;  Location: [facility] ORS;  Service: General;  Laterality: Right;   HEMORRHOID SURGERY     PARTIAL HYSTERECTOMY     THYROIDECTOMY N/A 08/25/2019   Procedure: THYROIDECTOMY EXTRACTION OF SUBTOTAL COMPONENT; PARATHYROID AUTOTRANSPLANT X1;  Surgeon: Duanne Guess, MD;  Location: [facility] ORS;  Service: General;  Laterality: N/A;  With Nerve  Monitoring(RLN)   TONSILLECTOMY      FAMILY HISTORY: Family History  Problem Relation Age of Onset   Breast cancer Mother    Heart attack Father     HEALTH MAINTENANCE: Social History   Tobacco Use   Smoking status: Never   Smokeless tobacco: Never  Vaping Use   Vaping status: Never Used  Substance Use Topics   Alcohol use: No   Drug use: No     Allergies  Allergen Reactions   Ace Inhibitors     Other reaction(s): Unknown   Egg-Derived Products Diarrhea   Other     Other reaction(s): Other (See Comments) Eggs   Prednisone     Other reaction(s): Other (See Comments) joint pain   Risedronate     Other reaction(s): Other (See Comments)   Sulfa Antibiotics Itching and Swelling    Other reaction(s): Other (See Comments)   Sulfasalazine Other (See Comments)    Current Outpatient Medications  Medication Sig Dispense Refill   esomeprazole (NEXIUM) 40 MG capsule Take by mouth.     acetaminophen (TYLENOL) 500 MG tablet Take 1,000 mg by mouth every 6 (six) hours as needed for mild pain or moderate pain.     albuterol (PROVENTIL HFA;VENTOLIN HFA) 108 (90 BASE) MCG/ACT inhaler Inhale 2 puffs into the lungs every 6 (six) hours as needed for wheezing or shortness of breath.     allopurinol (ZYLOPRIM) 100 MG tablet Take 100 mg by mouth daily.     amLODipine (NORVASC) 5 MG tablet Take 1 tablet (5 mg total) by mouth daily.     apixaban (ELIQUIS) 2.5 MG TABS tablet Take 1 tablet (2.5 mg total) by mouth 2 (two) times daily. 60 tablet 11   azelastine (ASTELIN) 0.1 % nasal spray Place 2 sprays into both nostrils 2 (two) times daily. Use in each nostril as directed 30 mL 12   calcium carbonate (TUMS - DOSED IN MG ELEMENTAL CALCIUM) 500 MG chewable tablet Chew 1-2 tablets (200-400 mg of elemental calcium total) by mouth 3 (three) times daily as needed for indigestion or heartburn. (Patient not taking: Reported on 10/05/2023)     cefpodoxime (VANTIN) 100 MG tablet Take 1 tablet (100 mg  total) by mouth 2 (two) times daily. 14 tablet 0   esomeprazole (NEXIUM) 40 MG capsule Take by mouth.     esomeprazole (NEXIUM) 40 MG capsule Take 1 capsule (40 mg total) by mouth daily before breakfast. 30 capsule 0   fluticasone (FLONASE) 50 MCG/ACT nasal spray Place 2 sprays into both nostrils daily. 11 mL 5   fluticasone-salmeterol (ADVAIR) 500-50 MCG/ACT AEPB Inhale 1 puff into the lungs in the morning and at bedtime.     furosemide (LASIX) 40 MG tablet Take 1 tablet (40 mg total) by mouth 2 (two) times daily. 60 tablet 4   hydrALAZINE (APRESOLINE) 100 MG tablet Take 1 tablet (100 mg total) by mouth 3 (three) times daily. 270 tablet 1   ipratropium-albuterol (DUONEB) 0.5-2.5 (3) MG/3ML SOLN Take 3 mLs by nebulization every 6 (six) hours as needed (SOB). 360 mL 0  levothyroxine (SYNTHROID) 150 MCG tablet Take 150 mcg by mouth daily.     lidocaine (LIDODERM) 5 % Place 1 patch onto the skin every 12 (twelve) hours. Remove & Discard patch within 12 hours or as directed by MD (Patient not taking: Reported on 10/05/2023) 15 patch 0   loperamide (IMODIUM) 2 MG capsule Take 1 capsule (2 mg total) by mouth as needed for diarrhea or loose stools. 30 capsule 0   metoprolol tartrate (LOPRESSOR) 50 MG tablet Take 25 mg by mouth 2 (two) times daily.     montelukast (SINGULAIR) 10 MG tablet Take 10 mg by mouth at bedtime.     Multiple Minerals-Vitamins (CALCIUM & VIT D3 BONE HEALTH PO) Take 1 tablet by mouth daily. 600 mg/ 25 mg     polyethylene glycol (MIRALAX / GLYCOLAX) 17 g packet Take 17 g by mouth daily as needed for mild constipation or moderate constipation. (Patient not taking: Reported on 10/05/2023)     potassium chloride (KLOR-CON) 10 MEQ tablet Take 10 mEq by mouth daily.     rosuvastatin (CRESTOR) 40 MG tablet Take 40 mg by mouth daily.     senna-docusate (SENOKOT-S) 8.6-50 MG tablet Take 2 tablets by mouth at bedtime as needed for mild constipation.     sertraline (ZOLOFT) 100 MG tablet Take 100  mg by mouth daily.     vitamin B-12 (CYANOCOBALAMIN) 500 MCG tablet Take 500 mcg by mouth daily.     No current facility-administered medications for this visit.    OBJECTIVE: Vitals:   10/22/23 1334  BP: (!) 160/62  Pulse: 72  Resp: 14  Temp: 99.7 F (37.6 C)  SpO2: 95%      Body mass index is 29.63 kg/m.      Physical exam not performed. Virtual visit.    LAB RESULTS:  Lab Results  Component Value Date   NA 136 08/06/2023   K 4.9 08/06/2023   CL 102 08/06/2023   CO2 24 08/06/2023   GLUCOSE 88 08/06/2023   BUN 79 (H) 08/06/2023   CREATININE 3.26 (H) 08/06/2023   CALCIUM 9.4 08/06/2023   PROT 6.3 (L) 08/05/2023   ALBUMIN 2.7 (L) 08/05/2023   AST 28 08/05/2023   ALT 25 08/05/2023   ALKPHOS 75 08/05/2023   BILITOT 0.4 08/05/2023   GFRNONAA 14 (L) 08/06/2023   GFRAA >60 12/31/2016    Lab Results  Component Value Date   WBC 6.4 10/22/2023   NEUTROABS 3.3 10/22/2023   HGB 9.1 (L) 10/22/2023   HCT 28.5 (L) 10/22/2023   MCV 86.9 10/22/2023   PLT 176 10/22/2023    Lab Results  Component Value Date   TIBC 372 08/14/2023   TIBC 288 06/03/2023   FERRITIN 91 08/14/2023   FERRITIN 97 06/15/2022   FERRITIN 106 06/14/2022   IRONPCTSAT 19 08/14/2023   IRONPCTSAT 12 06/03/2023     STUDIES: Sleep Study Documents Result Date: 10/04/2023 Ordered by an unspecified provider.   ASSESSMENT AND PLAN:   MONTINA GREENAWALT is a 83 y.o. female with pmh of anemia, skin cancer, CHF, diabetes, GERD, hypertension, hyperlipidemia, hypothyroidism, CKD stage IV, OSA, osteoporosis referred to hematology for management of normocytic anemia.  # Normocytic anemia # CKD stage IV -Likely secondary to CKD.  Has history of CKD stage IV which has been attributed to her chronic hypertension and prior diabetes.  SPEP/IFE checked in 2022 was negative.  -Iron panel/b12/folate normal. SPEP/IFE no M protein. Kappa/lambda proportionately elevated likely due to CKD. EPO 23  low for the degree of  anemia.   -Pretreatment Hb 7.3.  Started on Aranesp 200 mcg on 08/28/2023.  4 weeks later her hemoglobin improved to 10.3.    -Hemoglobin today is 9.1.  Will proceed with Aranesp 150 mcg.  Will dose reduce due to more than 2 g/dL increment in hemoglobin in 4 weeks.  Goal hemoglobin 10-11.  Will schedule her for monthly Aranesp injection with labs.  Iron panel every 3 months.  I will follow-up with her in 4 months.  Patient reports significant improvement in her quality of life with Aranesp injections and improvement in hemoglobin level.  Her blood pressure is 160/62 today which is not new for her.  Will continue to monitor.  # UTI -Urine culture grew Serratia.  Was initially started on Macrobid and then changed to cefpodoxime.  Symptoms have resolved.  Orders Placed This Encounter  Procedures   CBC with Differential (Cancer Center Only)   Ferritin   Iron and TIBC([facility])   Schedule monthly for Aranesp/H&H. RTC 4 months for MD visit, labs, Aranesp.  Patient expressed understanding and was in agreement with this plan. She also understands that She can call clinic at any time with any questions, concerns, or complaints.   I spent a total of 25 minutes reviewing chart data, face-to-face evaluation with the patient, counseling and coordination of care as detailed above.  Michaelyn Barter, MD   10/23/2023 2:33 PM

## 2023-10-25 ENCOUNTER — Encounter: Payer: Medicare Other | Admitting: Physician Assistant

## 2023-10-25 DIAGNOSIS — E11622 Type 2 diabetes mellitus with other skin ulcer: Secondary | ICD-10-CM | POA: Diagnosis not present

## 2023-10-27 ENCOUNTER — Ambulatory Visit
Admission: EM | Admit: 2023-10-27 | Discharge: 2023-10-27 | Disposition: A | Discharge status: Home / Self Care | Payer: Medicare Other | Attending: Emergency Medicine | Admitting: Emergency Medicine

## 2023-10-27 ENCOUNTER — Ambulatory Visit: Payer: Medicare Other

## 2023-10-27 DIAGNOSIS — U071 COVID-19: Secondary | ICD-10-CM | POA: Diagnosis present

## 2023-10-27 DIAGNOSIS — J449 Chronic obstructive pulmonary disease, unspecified: Secondary | ICD-10-CM | POA: Insufficient documentation

## 2023-10-27 LAB — RESP PANEL BY RT-PCR (FLU A&B, COVID) ARPGX2
Influenza A by PCR: NEGATIVE
Influenza B by PCR: NEGATIVE
SARS Coronavirus 2 by RT PCR: POSITIVE — AB

## 2023-10-27 MED ORDER — MOLNUPIRAVIR EUA 200MG CAPSULE: 4.0000 | ORAL_CAPSULE | Freq: Two times a day (BID) | ORAL | 0 refills | Status: AC

## 2023-10-27 MED ORDER — BENZONATATE 100 MG PO CAPS: 200.0000 mg | ORAL_CAPSULE | Freq: Three times a day (TID) | ORAL | 0 refills | Status: DC

## 2023-10-27 MED ORDER — MOLNUPIRAVIR EUA 200MG CAPSULE: 4.0000 | ORAL_CAPSULE | Freq: Two times a day (BID) | ORAL | 0 refills | Status: DC

## 2023-10-27 MED ORDER — PROMETHAZINE-DM 6.25-15 MG/5ML PO SYRP: 5.0000 mL | ORAL_SOLUTION | Freq: Four times a day (QID) | ORAL | 0 refills | Status: DC | PRN

## 2023-10-27 MED ORDER — IPRATROPIUM BROMIDE 0.06 % NA SOLN: 2.0000 | Freq: Four times a day (QID) | NASAL | 12 refills | Status: DC

## 2023-10-27 NOTE — ED Provider Notes (Addendum)
[facility]    CSN: [id number] Arrival date & time: 10/27/23  1103      History   Chief Complaint Chief Complaint  Patient presents with   Dizziness   Weakness    HPI [name] is a 83 y.o. female.   HPI  83 year old female with past medical history significant for OSA on CPAP, CHF, COPD, hypothyroidism, asthma, hypertension, GERD, and diabetes presents for evaluation of respiratory symptoms that began 3 days ago and consist of a fever with a Tmax of 101, runny nose and nasal congestion for clear nasal discharge, cough is productive for thick white sputum, shortness breath, and wheezing.  She also endorses feeling weak and dizziness when she stands up or changes position.  She denies any ear pain, sore throat, nausea, vomiting, or diarrhea.  She reports that she is eating and drinking fluids.  Past Medical History:  Diagnosis Date   Actinic keratosis    Albuminuria    Anemia    Arthritis    Asthma    Basal cell carcinoma 04/12/2017   Above right lateral brow. Nodulocystic type. EDC   Cancer (HCC)    skin   Cataract cortical, senile    CHF (congestive heart failure) (HCC)    Diabetes mellitus without complication (HCC)    GERD (gastroesophageal reflux disease)    Hemorrhoids    History of kidney stones    Hyperlipidemia    Hypertension    Hypothyroidism    Lyme disease    No kidney function    OSA (obstructive sleep apnea)    Osteoporosis    Osteoporosis    Reflux esophagitis    Steatohepatitis    Steatohepatitis     Patient Active Problem List   Diagnosis Date Noted   UTI (urinary tract infection) 09/24/2023   Atrial fibrillation with rapid ventricular response (HCC) 07/30/2023   Traumatic hematoma of lower leg with infection, right, subsequent encounter 07/30/2023   Hypokalemia 07/30/2023   Elevated troponin 07/30/2023   PAF (paroxysmal atrial fibrillation) (HCC) 06/03/2023   Hypercalcemia 04/16/2023   Hypermagnesemia 04/16/2023   HLD  (hyperlipidemia) 04/16/2023   COPD (chronic obstructive pulmonary disease) (HCC) 04/16/2023   Anemia in chronic kidney disease 03/27/2023   Electrolyte abnormality 03/24/2023   CKD (chronic kidney disease) stage 5, 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   Hyperparathyroidism, unspecified (HCC) 08/05/2022   Metabolic acidosis 08/02/2022   Uremia of renal origin 08/02/2022   Uremia 08/02/2022   Acute metabolic encephalopathy 08/02/2022   Acute on chronic diastolic congestive heart failure (HCC) 08/02/2022   Chronic bronchitis (HCC) 08/02/2022   Anemia of chronic kidney failure, stage 5 (HCC) 08/02/2022   Dizziness s/p fall 08/02/2022   AKI (acute kidney injury) (HCC) 06/12/2022   Hypomagnesemia 06/12/2022   Hyperphosphatemia 06/12/2022   Pneumonia due to COVID-19 virus 06/10/2022   Hypocalcemia, symptomatic 06/10/2022   Dyslipidemia 06/10/2022   Depression 06/10/2022   Right shoulder pain 06/10/2022   Generalized weakness    Nausea vomiting and diarrhea    Chronic obstructive pulmonary disease, unspecified (HCC) 10/02/2021   Gouty arthropathy, chronic, 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   Hypothyroidism 09/01/2019   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/25/2019   Multinodular goiter 07/31/2019   Class 2 severe obesity with serious comorbidity in adult [facility]) 12/12/2018   Type 2 diabetes mellitus with obesity (HCC) 09/04/2017   Bilateral lower extremity edema 09/04/2017   Gastroesophageal reflux disease without esophagitis 05/16/2017   Vomiting and diarrhea 12/29/2016   Enteritis 12/29/2016  Acute lower UTI 12/29/2016   Incomplete emptying of bladder 11/01/2016   Urge incontinence 07/14/2015   Acute renal failure superimposed on stage 4 chronic kidney disease (HCC) 08/06/2014   Renal mass, right 08/06/2014   Precordial pain 07/24/2014   Dyspnea 07/24/2014   Essential hypertension 07/24/2014   Hyperlipidemia 07/24/2014   Bladder neoplasm of uncertain malignant potential 07/16/2014   Gross  hematuria 07/16/2014   Asthma 05/17/2014   Osteoporosis 05/17/2014   Steatohepatitis 05/17/2014   OSA on CPAP 05/17/2014    Past Surgical History:  Procedure Laterality Date   ABDOMINAL HYSTERECTOMY     APPENDECTOMY     AV FISTULA PLACEMENT Left 05/19/2022   Procedure: ARTERIOVENOUS (AV) FISTULA CREATION ( BRACHIAL CEPHALIC );  Surgeon: Renford Dills, MD;  Location: [facility] ORS;  Service: Vascular;  Laterality: Left;   CARDIAC CATHETERIZATION  1980   [facility]   CARDIAC CATHETERIZATION  08/13/2014   [facility]. no significant CAD, normal LVEDP.    CATARACT EXTRACTION     CHOLECYSTECTOMY     COLONOSCOPY     COLONOSCOPY WITH PROPOFOL N/A 12/07/2016   Procedure: COLONOSCOPY WITH PROPOFOL;  Surgeon: Christena Deem, MD;  Location: [facility] ENDOSCOPY;  Service: Endoscopy;  Laterality: N/A;   ESOPHAGOGASTRODUODENOSCOPY     ESOPHAGOGASTRODUODENOSCOPY (EGD) WITH PROPOFOL N/A 12/07/2016   Procedure: ESOPHAGOGASTRODUODENOSCOPY (EGD) WITH PROPOFOL;  Surgeon: Christena Deem, MD;  Location: [facility] ENDOSCOPY;  Service: Endoscopy;  Laterality: N/A;   ESOPHAGOGASTRODUODENOSCOPY (EGD) WITH PROPOFOL N/A 01/07/2018   Procedure: ESOPHAGOGASTRODUODENOSCOPY (EGD) WITH PROPOFOL;  Surgeon: Christena Deem, MD;  Location: [facility] ENDOSCOPY;  Service: Endoscopy;  Laterality: N/A;   EYE SURGERY     HEMATOMA EVACUATION Right 08/02/2023   Procedure: EVACUATION HEMATOMA;  Surgeon: Leafy Ro, MD;  Location: [facility] ORS;  Service: General;  Laterality: Right;   HEMORRHOID SURGERY     PARTIAL HYSTERECTOMY     THYROIDECTOMY N/A 08/25/2019   Procedure: THYROIDECTOMY EXTRACTION OF SUBTOTAL COMPONENT; PARATHYROID AUTOTRANSPLANT X1;  Surgeon: Duanne Guess, MD;  Location: [facility] ORS;  Service: General;  Laterality: N/A;  With Nerve Monitoring(RLN)   TONSILLECTOMY      OB History   No obstetric history on file.      Home Medications    Prior to Admission medications   Medication Sig Start Date End Date Taking? Authorizing  Provider  acetaminophen (TYLENOL) 500 MG tablet Take 1,000 mg by mouth every 6 (six) hours as needed for mild pain or moderate pain.   Yes [provider]  albuterol (PROVENTIL HFA;VENTOLIN HFA) 108 (90 BASE) MCG/ACT inhaler Inhale 2 puffs into the lungs every 6 (six) hours as needed for wheezing or shortness of breath.   Yes [provider]  allopurinol (ZYLOPRIM) 100 MG tablet Take 100 mg by mouth daily. 09/17/23  Yes [provider]  amLODipine (NORVASC) 5 MG tablet Take 1 tablet (5 mg total) by mouth daily. 08/06/23 08/05/24 Yes Sunnie Nielsen, DO  apixaban (ELIQUIS) 2.5 MG TABS tablet Take 1 tablet (2.5 mg total) by mouth 2 (two) times daily. 06/04/23 06/03/24 Yes Gillis Santa, MD  azelastine (ASTELIN) 0.1 % nasal spray Place 2 sprays into both nostrils 2 (two) times daily. Use in each nostril as directed 10/02/23 12/31/23 Yes Read Drivers, MD  benzonatate (TESSALON) 100 MG capsule Take 2 capsules (200 mg total) by mouth every 8 (eight) hours. 10/27/23  Yes Becky Augusta, NP  cefpodoxime (VANTIN) 100 MG tablet Take 1 tablet (100 mg total) by mouth 2 (two) times daily.  09/27/23  Yes Michaelyn Barter, MD  esomeprazole (NEXIUM) 40 MG capsule Take by mouth. 10/22/23 10/21/24 Yes Michaelyn Barter, MD  esomeprazole (NEXIUM) 40 MG capsule Take 1 capsule (40 mg total) by mouth daily before breakfast. 10/22/23  Yes Michaelyn Barter, MD  fluticasone (FLONASE) 50 MCG/ACT nasal spray Place 2 sprays into both nostrils daily. 10/02/23 12/31/23 Yes Jovita Kussmaul B, MD  fluticasone-salmeterol (ADVAIR) 500-50 MCG/ACT AEPB Inhale 1 puff into the lungs in the morning and at bedtime.   Yes [provider]  furosemide (LASIX) 40 MG tablet Take 1 tablet (40 mg total) by mouth 2 (two) times daily. 10/05/23 01/03/24 Yes Hammock, Sheri, NP  hydrALAZINE (APRESOLINE) 100 MG tablet Take 1 tablet (100 mg total) by mouth 3 (three) times daily. 04/22/23  Yes Wouk, Wilfred Curtis, MD  ipratropium (ATROVENT)  0.06 % nasal spray Place 2 sprays into both nostrils 4 (four) times daily. 10/27/23  Yes Becky Augusta, NP  ipratropium-albuterol (DUONEB) 0.5-2.5 (3) MG/3ML SOLN Take 3 mLs by nebulization every 6 (six) hours as needed (SOB). 03/27/23  Yes Arnetha Courser, MD  levothyroxine (SYNTHROID) 150 MCG tablet Take 150 mcg by mouth daily.   Yes [provider]  loperamide (IMODIUM) 2 MG capsule Take 1 capsule (2 mg total) by mouth as needed for diarrhea or loose stools. 08/05/22  Yes Arnetha Courser, MD  metoprolol tartrate (LOPRESSOR) 50 MG tablet Take 25 mg by mouth 2 (two) times daily.   Yes [provider]  molnupiravir EUA (LAGEVRIO) 200 mg CAPS capsule Take 4 capsules (800 mg total) by mouth 2 (two) times daily for 5 days. 10/27/23 11/01/23 Yes Becky Augusta, NP  montelukast (SINGULAIR) 10 MG tablet Take 10 mg by mouth at bedtime.   Yes [provider]  Multiple Minerals-Vitamins (CALCIUM & VIT D3 BONE HEALTH PO) Take 1 tablet by mouth daily. 600 mg/ 25 mg   Yes [provider]  potassium chloride (KLOR-CON) 10 MEQ tablet Take 10 mEq by mouth daily. 05/30/23 05/29/24 Yes [provider]  promethazine-dextromethorphan (PROMETHAZINE-DM) 6.25-15 MG/5ML syrup Take 5 mLs by mouth 4 (four) times daily as needed. 10/27/23  Yes Becky Augusta, NP  rosuvastatin (CRESTOR) 40 MG tablet Take 40 mg by mouth daily.   Yes [provider]  senna-docusate (SENOKOT-S) 8.6-50 MG tablet Take 2 tablets by mouth at bedtime as needed for mild constipation. 08/06/23  Yes Sunnie Nielsen, DO  sertraline (ZOLOFT) 100 MG tablet Take 100 mg by mouth daily.   Yes [provider]  vitamin B-12 (CYANOCOBALAMIN) 500 MCG tablet Take 500 mcg by mouth daily.   Yes [provider]  esomeprazole (NEXIUM) 40 MG capsule Take by mouth. 04/29/15 05/16/18  [provider]    Family History Family History  Problem Relation Age of Onset   Breast cancer Mother    Heart attack  Father     Social History Social History   Tobacco Use   Smoking status: Never   Smokeless tobacco: Never  Vaping Use   Vaping status: Never Used  Substance Use Topics   Alcohol use: No   Drug use: No     Allergies   Ace inhibitors, Egg-derived products, Other, Prednisone, Risedronate, Sulfa antibiotics, and Sulfasalazine   Review of Systems Review of Systems  Constitutional:  Positive for fatigue and fever.  HENT:  Positive for congestion and rhinorrhea. Negative for ear pain and sore throat.   Respiratory:  Positive for cough, shortness of breath and wheezing.   Gastrointestinal:  Negative  for diarrhea, nausea and vomiting.  Skin:  Negative for rash.     Physical Exam Triage Vital Signs ED Triage Vitals [10/27/23 1206]  Encounter Vitals Group     BP      Systolic BP Percentile      Diastolic BP Percentile      Pulse      Resp 16     Temp      Temp Source Oral     SpO2      Weight      Height      Head Circumference      Peak Flow      Pain Score      Pain Loc      Pain Education      Exclude from Growth Chart    No data found.  Updated Vital Signs BP (!) 157/50 (BP Location: Right Arm)   Pulse (!) 58   Temp 98.2 F (36.8 C) (Oral)   Resp 16   Wt 162 lb (73.5 kg)   SpO2 98%   BMI 29.63 kg/m   Visual Acuity Right Eye Distance:   Left Eye Distance:   Bilateral Distance:    Right Eye Near:   Left Eye Near:    Bilateral Near:     Physical Exam Vitals and nursing note reviewed.  Constitutional:      Appearance: Normal appearance. She is not ill-appearing.  HENT:     Head: Normocephalic and atraumatic.     Right Ear: Tympanic membrane, ear canal and external ear normal. There is no impacted cerumen.     Left Ear: Tympanic membrane, ear canal and external ear normal. There is no impacted cerumen.     Nose: Congestion and rhinorrhea present.     Comments: Nasal mucosa is edematous and erythematous with scant clear discharge in both nares.     Mouth/Throat:     Mouth: Mucous membranes are moist.     Pharynx: Oropharynx is clear. No oropharyngeal exudate or posterior oropharyngeal erythema.  Cardiovascular:     Rate and Rhythm: Normal rate and regular rhythm.     Pulses: Normal pulses.     Heart sounds: Normal heart sounds. No murmur heard.    No friction rub. No gallop.  Pulmonary:     Effort: Pulmonary effort is normal.     Breath sounds: Normal breath sounds. No wheezing, rhonchi or rales.  Musculoskeletal:     Cervical back: Normal range of motion and neck supple. No tenderness.  Lymphadenopathy:     Cervical: No cervical adenopathy.  Skin:    General: Skin is warm and dry.     Capillary Refill: Capillary refill takes less than 2 seconds.     Findings: No erythema or rash.  Neurological:     General: No focal deficit present.     Mental Status: She is alert and oriented to person, place, and time.      UC Treatments / Results  Labs (all labs ordered are listed, but only abnormal results are displayed) Labs Reviewed  RESP PANEL BY RT-PCR (FLU A&B, COVID) ARPGX2 - Abnormal; Notable for the following components:      Result Value   SARS Coronavirus 2 by RT PCR POSITIVE (*)    All other components within normal limits    EKG   Radiology No results found.  Procedures Procedures (including critical care time)  Medications Ordered in UC Medications - No data to display  Initial Impression / Assessment  and Plan / UC Course  I have reviewed the triage vital signs and the nursing notes.  Pertinent labs & imaging results that were available during my care of the patient were reviewed by me and considered in my medical decision making (see chart for details).   Patient is a nontoxic-appearing 83 year old female presenting for evaluation of 3 days with the respiratory symptoms as outlined HPI above.  She endorses dizziness when she attempts to stand up and change position but not at rest and also associated  weakness.  In the exam room she is alert and oriented x 3 and she is able to speak in full sentence with without dyspnea or tachypnea.  Room air oxygen saturation is 98% and her respiratory rate is 16.  She is afebrile with an oral temp of 98.2.  Her physical exam does reveal inflammation of her upper respiratory tract with scant clear nasal discharge.  Oral mucous membranes are moist but sticky.  No cervical lymphadenopathy appreciated exam.  Cardiopulmonary exam reveals colonic sounds in all fields.  Given patient's cluster symptoms I will order a COVID and flu PCR as well as a chest x-ray to evaluate for the presence of any acute cardiopulmonary pathology.  I suspect that the patient's dizziness is secondary to mild dehydration given her sticky oral mucous membranes.  Chest x-ray independently reviewed and evaluated by me.  Impression: Lung fields are well aerated without evidence of infiltrate or effusion.  Cardiomediastinal silhouette reveals an enlarged heart.  Radiology overread is pending. Radiology impression states no active cardiopulmonary disease.  Stable cardiomegaly.  Respiratory panel is positive for COVID.  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  Atrovent nasal spray to help with the nasal congestion, Tessalon Perles and Promethazine DM cough syrup for cough and congestion.  She can use over-the-counter Tylenol as needed for fever or pain.  ER and return precautions reviewed.   Final Clinical Impressions(s) / UC Diagnoses   Final diagnoses:  Chronic obstructive pulmonary disease, unspecified COPD type (HCC)  COVID-19     Discharge Instructions      CDC guidelines state that you must wear a mask for the first 5 days of symptoms when you are around other people.  After 5 days you no longer need to mask as you are no longer considered infectious.  There is no longer need to quarantine unless you have  a fever.  If you do have a fever then you need to quarantine until you have been fever free for 24 hours without taking Tylenol and/or ibuprofen.  Use over-the-counter Tylenol and/or ibuprofen according to the package instructions as needed for fever and pain.  Use the Atrovent nasal spray, 2 squirts up each nostril every 6 hours, as needed for nasal congestion and runny nose.  Use the Tessalon Perles every 8 hours during the day as needed for cough.  Take them with a small sip of water.  You may experience numbness to the base of your tongue or metallic taste in her mouth, this is normal.  Use the Promethazine DM cough syrup at bedtime as needed for cough and congestion.  Be mindful this medication will make you sleepy.  Take the molnupiravir twice daily for 5 days for treatment of COVID-19.  If you develop any worsening respiratory symptoms such as shortness of breath, shortness of breath at rest, feel as though you cannot catch your  breath, you are unable to speak in full sentences, or, as a late sign, your lips begin turning blue you need to call 911 and go to the ER for evaluation.      ED Prescriptions     Medication Sig Dispense Auth. Provider   benzonatate (TESSALON) 100 MG capsule Take 2 capsules (200 mg total) by mouth every 8 (eight) hours. 21 capsule Becky Augusta, NP   ipratropium (ATROVENT) 0.06 % nasal spray Place 2 sprays into both nostrils 4 (four) times daily. 15 mL Becky Augusta, NP   promethazine-dextromethorphan (PROMETHAZINE-DM) 6.25-15 MG/5ML syrup Take 5 mLs by mouth 4 (four) times daily as needed. 118 mL Becky Augusta, NP   molnupiravir EUA (LAGEVRIO) 200 mg CAPS capsule Take 4 capsules (800 mg total) by mouth 2 (two) times daily for 5 days. 40 capsule Becky Augusta, NP      PDMP not reviewed this encounter.   Becky Augusta, NP 10/27/23 1333    Becky Augusta, NP 10/27/23 1350

## 2023-10-27 NOTE — Discharge Instructions (Addendum)
CDC guidelines state that you must wear a mask for the first 5 days of symptoms when you are around other people.  After 5 days you no longer need to mask as you are no longer considered infectious.  There is no longer need to quarantine unless you have a fever.  If you do have a fever then you need to quarantine until you have been fever free for 24 hours without taking Tylenol and/or ibuprofen.  Use over-the-counter Tylenol and/or ibuprofen according to the package instructions as needed for fever and pain.  Use the Atrovent nasal spray, 2 squirts up each nostril every 6 hours, as needed for nasal congestion and runny nose.  Use the Tessalon Perles every 8 hours during the day as needed for cough.  Take them with a small sip of water.  You may experience numbness to the base of your tongue or metallic taste in her mouth, this is normal.  Use the Promethazine DM cough syrup at bedtime as needed for cough and congestion.  Be mindful this medication will make you sleepy.  Take the molnupiravir twice daily for 5 days for treatment of COVID-19.  If you develop any worsening respiratory symptoms such as shortness of breath, shortness of breath at rest, feel as though you cannot catch your breath, you are unable to speak in full sentences, or, as a late sign, your lips begin turning blue you need to call 911 and go to the ER for evaluation.

## 2023-10-27 NOTE — ED Triage Notes (Signed)
Pt c/o dizziness & weakness x3 days. States also coughing worse at night. Gets dizzy when trying to get up.

## 2023-10-30 ENCOUNTER — Ambulatory Visit: Payer: Medicare Other | Admitting: Family Medicine

## 2023-11-01 ENCOUNTER — Ambulatory Visit: Payer: Medicare Other | Admitting: Physician Assistant

## 2023-11-01 ENCOUNTER — Ambulatory Visit: Payer: Medicare Other | Admitting: Dermatology

## 2023-11-08 ENCOUNTER — Ambulatory Visit: Payer: Medicare Other | Admitting: Physician Assistant

## 2023-11-12 ENCOUNTER — Ambulatory Visit: Payer: Medicare Other | Attending: Internal Medicine

## 2023-11-12 DIAGNOSIS — J449 Chronic obstructive pulmonary disease, unspecified: Secondary | ICD-10-CM

## 2023-11-12 MED ORDER — ALBUTEROL SULFATE (2.5 MG/3ML) 0.083% IN NEBU
2.5000 mg | INHALATION_SOLUTION | Freq: Once | RESPIRATORY_TRACT | Status: AC
  Administered 2023-11-12: 2.5 mg via RESPIRATORY_TRACT
  Filled 2023-11-12: qty 3

## 2023-11-13 LAB — PULMONARY FUNCTION TEST ARMC ONLY
DL/VA % pred: 73 %
DL/VA: 3.04 ml/min/mmHg/L
DLCO unc % pred: 67 %
DLCO unc: 11.81 ml/min/mmHg
FEF 25-75 Post: 1.32 L/s
FEF 25-75 Pre: 1.15 L/s
FEF2575-%Change-Post: 14 %
FEF2575-%Pred-Post: 111 %
FEF2575-%Pred-Pre: 96 %
FEV1-%Change-Post: 2 %
FEV1-%Pred-Post: 86 %
FEV1-%Pred-Pre: 83 %
FEV1-Post: 1.45 L
FEV1-Pre: 1.41 L
FEV1FVC-%Change-Post: 1 %
FEV1FVC-%Pred-Pre: 102 %
FEV6-%Change-Post: 1 %
FEV6-%Pred-Post: 88 %
FEV6-%Pred-Pre: 87 %
FEV6-Post: 1.91 L
FEV6-Pre: 1.89 L
FEV6FVC-%Pred-Post: 106 %
FEV6FVC-%Pred-Pre: 106 %
FVC-%Change-Post: 1 %
FVC-%Pred-Post: 83 %
FVC-%Pred-Pre: 82 %
FVC-Post: 1.91 L
FVC-Pre: 1.89 L
Post FEV1/FVC ratio: 76 %
Post FEV6/FVC ratio: 100 %
Pre FEV1/FVC ratio: 75 %
Pre FEV6/FVC Ratio: 100 %

## 2023-11-14 ENCOUNTER — Encounter: Payer: Self-pay | Admitting: Internal Medicine

## 2023-11-14 ENCOUNTER — Telehealth: Payer: Self-pay

## 2023-11-14 ENCOUNTER — Ambulatory Visit: Payer: Medicare Other | Admitting: Internal Medicine

## 2023-11-14 ENCOUNTER — Telehealth (INDEPENDENT_AMBULATORY_CARE_PROVIDER_SITE_OTHER): Payer: Self-pay | Admitting: Nurse Practitioner

## 2023-11-14 VITALS — BP 120/60 | HR 61 | Temp 98.0°F | Ht 62.0 in | Wt 156.0 lb

## 2023-11-14 DIAGNOSIS — G4733 Obstructive sleep apnea (adult) (pediatric): Secondary | ICD-10-CM | POA: Diagnosis not present

## 2023-11-14 DIAGNOSIS — J449 Chronic obstructive pulmonary disease, unspecified: Secondary | ICD-10-CM | POA: Diagnosis not present

## 2023-11-14 MED ORDER — IPRATROPIUM-ALBUTEROL 0.5-2.5 (3) MG/3ML IN SOLN: 3.0000 mL | Freq: Four times a day (QID) | RESPIRATORY_TRACT | 0 refills | Status: DC | PRN

## 2023-11-14 MED ORDER — FLUTICASONE-SALMETEROL 500-50 MCG/ACT IN AEPB: 1.0000 | INHALATION_SPRAY | Freq: Two times a day (BID) | RESPIRATORY_TRACT | 5 refills | Status: DC

## 2023-11-14 MED ORDER — ALBUTEROL SULFATE HFA 108 (90 BASE) MCG/ACT IN AERS: 2.0000 | INHALATION_SPRAY | Freq: Four times a day (QID) | RESPIRATORY_TRACT | 3 refills | Status: AC | PRN

## 2023-11-14 MED ORDER — FLUTICASONE FUROATE-VILANTEROL 200-25 MCG/ACT IN AEPB: 1.0000 | INHALATION_SPRAY | Freq: Every day | RESPIRATORY_TRACT | 5 refills | Status: DC

## 2023-11-14 NOTE — Patient Instructions (Signed)
Excellent job A+ G[employer]AR!!  Continue CPAP as prescribed Continue inhalers as prescribed  Avoid Allergens and Irritants Avoid secondhand smoke Avoid SICK contacts Recommend  Masking  when appropriate Recommend Keep up-to-date with vaccinations   Be aware of reduced alertness and do not drive or operate heavy machinery if experiencing this or drowsiness.  Exercise encouraged, as tolerated. Encouraged proper weight management.  Important to get eight or more hours of sleep  Limiting the use of the computer and television before bedtime.  Decrease naps during the day, so night time sleep will become enhanced.  Limit caffeine, and sleep deprivation.

## 2023-11-14 NOTE — Telephone Encounter (Signed)
[name] from [facility] called and lvm for pt stating they need re-evaluation of access. Called back attempted both numbers to schedule, no voicemail and unable to leave message

## 2023-11-14 NOTE — Telephone Encounter (Signed)
[employer] not covered.

## 2023-11-14 NOTE — Progress Notes (Signed)
Name: [name] MRN: [id number] DOB: 07/14/1941    CHIEF COMPLAINT:  Assessment of sleep apnea   HISTORY OF PRESENT ILLNESS: Diagnosed with OSA 20 years ago Has mild OSA with an AHI of 6 However patient states that her PCP helps her tremendously Her AHI is reduced to 4 point Auto CPAP 5-20 Patient uses and benefits from therapy   Regarding COPD Pulmonary function test reviewed in detail Patient seems to have small obstructive airways disease No significant abnormality however patient requires Advair as needed   No exacerbation at this time No evidence of heart failure at this time No evidence or signs of infection at this time No respiratory distress No fevers, chills, nausea, vomiting, diarrhea No evidence of lower extremity edema No evidence hemoptysis    PAST MEDICAL HISTORY :   has a past medical history of Actinic keratosis, Albuminuria, Anemia, Arthritis, Asthma, Basal cell carcinoma (04/12/2017), Cancer (HCC), Cataract cortical, senile, CHF (congestive heart failure) (HCC), Diabetes mellitus without complication (HCC), GERD (gastroesophageal reflux disease), Hemorrhoids, History of kidney stones, Hyperlipidemia, Hypertension, Hypothyroidism, Lyme disease, No kidney function, OSA (obstructive sleep apnea), Osteoporosis, Osteoporosis, Reflux esophagitis, Steatohepatitis, and Steatohepatitis.  has a past surgical history that includes Appendectomy; Cataract extraction; Tonsillectomy; Cholecystectomy; Partial hysterectomy; Cardiac catheterization (1980); Cardiac catheterization (08/13/2014); Colonoscopy; Esophagogastroduodenoscopy; Hemorrhoid surgery; Colonoscopy with propofol (N/A, 12/07/2016); Esophagogastroduodenoscopy (egd) with propofol (N/A, 12/07/2016); Abdominal hysterectomy; Eye surgery; Esophagogastroduodenoscopy (egd) with propofol (N/A, 01/07/2018); Thyroidectomy (N/A, 08/25/2019); AV fistula placement (Left, 05/19/2022); and Hematoma evacuation (Right,  08/02/2023). Prior to Admission medications   Medication Sig Start Date End Date Taking? Authorizing Provider  acetaminophen (TYLENOL) 500 MG tablet Take 1,000 mg by mouth every 6 (six) hours as needed for mild pain or moderate pain.    [provider]  albuterol (PROVENTIL HFA;VENTOLIN HFA) 108 (90 BASE) MCG/ACT inhaler Inhale 2 puffs into the lungs every 6 (six) hours as needed for wheezing or shortness of breath.    [provider]  amLODipine (NORVASC) 5 MG tablet Take 1 tablet (5 mg total) by mouth daily. 08/06/23 08/05/24  Sunnie Nielsen, DO  apixaban (ELIQUIS) 2.5 MG TABS tablet Take 1 tablet (2.5 mg total) by mouth 2 (two) times daily. 06/04/23 06/03/24  Gillis Santa, MD  calcium carbonate (TUMS - DOSED IN MG ELEMENTAL CALCIUM) 500 MG chewable tablet Chew 1-2 tablets (200-400 mg of elemental calcium total) by mouth 3 (three) times daily as needed for indigestion or heartburn. 08/06/23   Sunnie Nielsen, DO  cyanocobalamin (VITAMIN B12) 500 MCG tablet Take 1 tablet (500 mcg total) by mouth daily. 06/05/23 09/03/23  Gillis Santa, MD  esomeprazole (NEXIUM) 40 MG capsule Take 40 mg by mouth daily before breakfast.    [provider]  fluticasone (FLONASE) 50 MCG/ACT nasal spray Place 2 sprays into both nostrils daily.    [provider]  fluticasone-salmeterol (ADVAIR) 500-50 MCG/ACT AEPB Inhale 1 puff into the lungs in the morning and at bedtime.    [provider]  furosemide (LASIX) 40 MG tablet Take 1 tablet (40 mg total) by mouth 2 (two) times daily. 06/04/23 09/02/23  Gillis Santa, MD  hydrALAZINE (APRESOLINE) 100 MG tablet Take 1 tablet (100 mg total) by mouth 3 (three) times daily. 04/22/23   Wouk, Wilfred Curtis, MD  ipratropium-albuterol (DUONEB) 0.5-2.5 (3) MG/3ML SOLN Take 3 mLs by nebulization every 6 (six) hours as needed (SOB). 03/27/23   Arnetha Courser, MD  levothyroxine (SYNTHROID) 150 MCG tablet Take 150 mcg by mouth daily.  [provider]  lidocaine (LIDODERM) 5 % Place 1 patch onto the skin every 12 (twelve) hours. Remove & Discard patch within 12 hours or as directed by MD 06/08/22   Sharman Cheek, MD  loperamide (IMODIUM) 2 MG capsule Take 1 capsule (2 mg total) by mouth as needed for diarrhea or loose stools. 08/05/22   Arnetha Courser, MD  metoprolol tartrate (LOPRESSOR) 50 MG tablet Take 25 mg by mouth 2 (two) times daily.    [provider]  montelukast (SINGULAIR) 10 MG tablet Take 10 mg by mouth at bedtime.    [provider]  Multiple Minerals-Vitamins (CALCIUM & VIT D3 BONE HEALTH PO) Take 1 tablet by mouth daily. 600 mg/ 25 mg    [provider]  polyethylene glycol (MIRALAX / GLYCOLAX) 17 g packet Take 17 g by mouth daily as needed for mild constipation or moderate constipation. 08/06/23   Sunnie Nielsen, DO  potassium chloride (KLOR-CON) 10 MEQ tablet Take 10 mEq by mouth daily. 05/30/23 05/29/24  [provider]  rosuvastatin (CRESTOR) 40 MG tablet Take 40 mg by mouth daily.    [provider]  senna-docusate (SENOKOT-S) 8.6-50 MG tablet Take 2 tablets by mouth at bedtime as needed for mild constipation. 08/06/23   Sunnie Nielsen, DO  sertraline (ZOLOFT) 100 MG tablet Take 100 mg by mouth daily.    [provider]  Vitamin D, Ergocalciferol, (DRISDOL) 1.25 MG (50000 UNIT) CAPS capsule Take 1 capsule (50,000 Units total) by mouth every 7 (seven) days. 06/10/23 09/08/23  Gillis Santa, MD   Allergies  Allergen Reactions   Ace Inhibitors     Other reaction(s): Unknown   Egg-Derived Products Diarrhea   Other     Other reaction(s): Other (See Comments) Eggs   Prednisone     Other reaction(s): Other (See Comments) joint pain   Risedronate     Other reaction(s): Other (See Comments)   Sulfa Antibiotics Itching and Swelling    Other reaction(s): Other (See Comments)   Sulfasalazine Other (See Comments)    FAMILY HISTORY:  family history includes  Breast cancer in her mother; Heart attack in her father. SOCIAL HISTORY:  reports that she has never smoked. She has never used smokeless tobacco. She reports that she does not drink alcohol and does not use drugs.    BP 120/60 (BP Location: Left Arm, Patient Position: Sitting, Cuff Size: Normal)   Pulse 61   Temp 98 F (36.7 C) (Temporal)   Ht 5\' 2"  (1.575 m)   Wt 156 lb (70.8 kg)   SpO2 98%   BMI 28.53 kg/m    Review of Systems: Gen:  Denies  fever, sweats, chills weight loss  HEENT: Denies blurred vision, double vision, ear pain, eye pain, hearing loss, nose bleeds, sore throat Cardiac:  No dizziness, chest pain or heaviness, chest tightness,edema, No JVD Resp:   No cough, -sputum production, -shortness of breath,-wheezing, -hemoptysis,  Other:  All other systems negative   Physical Examination:   General Appearance: No distress  EYES PERRLA, EOM intact.   NECK Supple, No JVD Pulmonary: normal breath sounds, No wheezing.  CardiovascularNormal S1,S2.  No m/r/g.   Abdomen: Benign, Soft, non-tender. Neurology UE/LE 5/5 strength, no focal deficits Ext pulses intact, cap refill intact ALL OTHER ROS ARE NEGATIVE   ASSESSMENT AND PLAN SYNOPSIS  83 year old pleasant white female seen today for follow-up assessment for COPD and OSA in setting of obesity and deconditioned state   Assessment of Sleep apnea  Patient has excellent compliance report Discussed in detail with patient OSA is well-controlled with CPAP Continue current prescription Auto CPAP 5-20 AHI reduced to less than 5 Patient states she feels really good with her machine Less fatigue more energy restful sleep Patient with mild OSA at 6   Patient Instructions Continue to use CPAP every night, minimum of 4-6 hours a night.  Change equipment every 30 days or as directed by DME.  Wash your tubing with warm soap and water daily, hang to dry. Wash humidifier portion weekly. Use bottled, distilled water and  change daily   Be aware of reduced alertness and do not drive or operate heavy machinery if experiencing this or drowsiness.  Exercise encouraged, as tolerated. Encouraged proper weight management.  Important to get eight or more hours of sleep  Limiting the use of the computer and television before bedtime.  Decrease naps during the day, so night time sleep will become enhanced.  Limit caffeine, and sleep deprivation.  HTN, stroke, uncontrolled diabetes and heart failure are potential risk factors.  Risk of untreated sleep apnea including cardiac arrhthymias, stroke, DM, pulm HTN.      Assessment of COPD Pulmonary function test reviewed in detail with the patient No significant bronchodilator response Normal ratio FEV1 to FVC ratio 75% predicted FEV1 88% predicted FVC 82% predicted Normal FEV1 and FVC DLCO within normal limits Patient may have some mild obstructive airways disease We will plan for Breo inhaler  Obesity -recommend significant weight loss -recommend changing diet  Deconditioned state -Recommend increased daily activity and exercise   MEDICATION ADJUSTMENTS/LABS AND TESTS ORDERED: Continue Inhalers as prescribed Continue CPAP as prescribed Avoid secondhand smoke Avoid SICK contacts Recommend  Masking  when appropriate Recommend Keep up-to-date with vaccinations   CURRENT MEDICATIONS REVIEWED AT LENGTH WITH PATIENT TODAY   Patient  satisfied with Plan of action and management. All questions answered  Follow up  6 months   Total Time Spent  41  mins   Lucie Leather, M.D.  [facility]  Medical Director [facility] Medical Director [facility] Cardio-Pulmonary Department

## 2023-11-15 ENCOUNTER — Encounter: Payer: Medicare Other | Attending: Physician Assistant | Admitting: Physician Assistant

## 2023-11-15 ENCOUNTER — Other Ambulatory Visit (HOSPITAL_COMMUNITY): Payer: Self-pay

## 2023-11-15 ENCOUNTER — Telehealth: Payer: Self-pay

## 2023-11-15 DIAGNOSIS — L97812 Non-pressure chronic ulcer of other part of right lower leg with fat layer exposed: Secondary | ICD-10-CM | POA: Insufficient documentation

## 2023-11-15 DIAGNOSIS — X58XXXA Exposure to other specified factors, initial encounter: Secondary | ICD-10-CM | POA: Insufficient documentation

## 2023-11-15 DIAGNOSIS — N184 Chronic kidney disease, stage 4 (severe): Secondary | ICD-10-CM | POA: Diagnosis not present

## 2023-11-15 DIAGNOSIS — Z7901 Long term (current) use of anticoagulants: Secondary | ICD-10-CM | POA: Diagnosis not present

## 2023-11-15 DIAGNOSIS — I129 Hypertensive chronic kidney disease with stage 1 through stage 4 chronic kidney disease, or unspecified chronic kidney disease: Secondary | ICD-10-CM | POA: Diagnosis not present

## 2023-11-15 DIAGNOSIS — I480 Paroxysmal atrial fibrillation: Secondary | ICD-10-CM | POA: Insufficient documentation

## 2023-11-15 DIAGNOSIS — E11622 Type 2 diabetes mellitus with other skin ulcer: Secondary | ICD-10-CM | POA: Insufficient documentation

## 2023-11-15 DIAGNOSIS — S81811A Laceration without foreign body, right lower leg, initial encounter: Secondary | ICD-10-CM | POA: Diagnosis present

## 2023-11-15 NOTE — Telephone Encounter (Signed)
*  Pulm  Pharmacy Patient Advocate Encounter   Received notification from CoverMyMeds that prior authorization for Ipratropium-Albuterol 0.5-2.5 (3)MG/3ML solution  is required/requested.   Insurance verification completed.   The patient is insured through [employer] .   Per test claim: PA required; PA submitted to above mentioned insurance via CoverMyMeds Key/confirmation #/EOC [id number] Status is pending

## 2023-11-15 NOTE — Telephone Encounter (Signed)
Patient has picked up Breo. Nothing further needed.

## 2023-11-15 NOTE — Telephone Encounter (Signed)
*  Pulm  Pharmacy Patient Advocate Encounter  Received notification from [employer] that Prior Authorization for Fluticasone-Salmeterol 500-50MCG/ACT aerosol powder  has been APPROVED from 11/15/2023 to 11/13/2024. Ran test claim, Copay is $75.03. This test claim was processed through [facility]- copay amounts may vary at other pharmacies due to pharmacy/plan contracts, or as the patient moves through the different stages of their insurance plan.   PA #/Case ID/Reference #: [name]

## 2023-11-19 NOTE — Telephone Encounter (Signed)
Pharmacy Patient Advocate Encounter  Received notification from [employer] that Prior Authorization for Ipratropium-Albuterol 0.5-2.5 (3)MG/3ML solution  has been DENIED.  Full denial letter will be uploaded to the media tab. See denial reason below.   PA #/Case ID/Reference #: We denied this request under Medicare Part D because: ? The information received does not support approval under your Medicare Part D benefit; however, coverage or payment may be available under your Part A or Part B benefit. The Social Security Act states that a drug prescribed to a Part D eligible individual cannot be considered a covered Part D  drug if payment for the drug is or would be available under Medicare Part A or Part B. Medicare Part B covers the requested drug when used in the home, or a home-type setting, with a nebulizer. Because the information we have shows that you will be using the requested drug in a nebulizer in  your home, it cannot be covered under your Medicare Part D benefit. This request was denied under your Medicare Part D benefit; however, it may be covered under Medicare Part A or Part B. For more information, talk to your prescriber or call 1-800-MEDICARE. We hope this letter helps you understand why we denied your request

## 2023-11-19 NOTE — Telephone Encounter (Signed)
I spoke with the patinet. She said he has already picked up the Duoneb from [facility].  Nothing further needed.

## 2023-11-19 NOTE — Telephone Encounter (Signed)
See telephone encounter from 2/12. Breo was sent in for the patient and she has picked it up.  Nothing further needed.

## 2023-11-22 ENCOUNTER — Inpatient Hospital Stay: Payer: Medicare Other

## 2023-11-22 ENCOUNTER — Ambulatory Visit: Payer: Medicare Other | Admitting: Physician Assistant

## 2023-11-23 ENCOUNTER — Ambulatory Visit: Payer: Medicare Other | Admitting: Physician Assistant

## 2023-11-26 ENCOUNTER — Ambulatory Visit: Payer: Medicare Other | Admitting: Physician Assistant

## 2023-11-27 ENCOUNTER — Inpatient Hospital Stay: Payer: Medicare Other

## 2023-11-27 ENCOUNTER — Inpatient Hospital Stay: Payer: Medicare Other | Attending: Internal Medicine

## 2023-11-27 ENCOUNTER — Telehealth: Payer: Self-pay | Admitting: *Deleted

## 2023-11-27 ENCOUNTER — Ambulatory Visit: Payer: Medicare Other

## 2023-11-27 ENCOUNTER — Ambulatory Visit: Payer: Medicare Other | Admitting: Dermatology

## 2023-11-27 VITALS — BP 145/55

## 2023-11-27 DIAGNOSIS — D631 Anemia in chronic kidney disease: Secondary | ICD-10-CM | POA: Diagnosis present

## 2023-11-27 DIAGNOSIS — N184 Chronic kidney disease, stage 4 (severe): Secondary | ICD-10-CM | POA: Diagnosis present

## 2023-11-27 DIAGNOSIS — I130 Hypertensive heart and chronic kidney disease with heart failure and stage 1 through stage 4 chronic kidney disease, or unspecified chronic kidney disease: Secondary | ICD-10-CM | POA: Insufficient documentation

## 2023-11-27 DIAGNOSIS — K219 Gastro-esophageal reflux disease without esophagitis: Secondary | ICD-10-CM

## 2023-11-27 DIAGNOSIS — S81811A Laceration without foreign body, right lower leg, initial encounter: Secondary | ICD-10-CM | POA: Diagnosis not present

## 2023-11-27 LAB — CBC WITH DIFFERENTIAL (CANCER CENTER ONLY)
Abs Immature Granulocytes: 0.03 10*3/uL (ref 0.00–0.07)
Basophils Absolute: 0.1 10*3/uL (ref 0.0–0.1)
Basophils Relative: 1 %
Eosinophils Absolute: 0.3 10*3/uL (ref 0.0–0.5)
Eosinophils Relative: 4 %
HCT: 29.2 % — ABNORMAL LOW (ref 36.0–46.0)
Hemoglobin: 9.4 g/dL — ABNORMAL LOW (ref 12.0–15.0)
Immature Granulocytes: 0 %
Lymphocytes Relative: 41 %
Lymphs Abs: 2.9 10*3/uL (ref 0.7–4.0)
MCH: 27.4 pg (ref 26.0–34.0)
MCHC: 32.2 g/dL (ref 30.0–36.0)
MCV: 85.1 fL (ref 80.0–100.0)
Monocytes Absolute: 0.5 10*3/uL (ref 0.1–1.0)
Monocytes Relative: 7 %
Neutro Abs: 3.3 10*3/uL (ref 1.7–7.7)
Neutrophils Relative %: 47 %
Platelet Count: 193 10*3/uL (ref 150–400)
RBC: 3.43 MIL/uL — ABNORMAL LOW (ref 3.87–5.11)
RDW: 16.3 % — ABNORMAL HIGH (ref 11.5–15.5)
WBC Count: 7.1 10*3/uL (ref 4.0–10.5)
nRBC: 0 % (ref 0.0–0.2)

## 2023-11-27 LAB — IRON AND TIBC
Iron: 52 ug/dL (ref 28–170)
Saturation Ratios: 14 % (ref 10.4–31.8)
TIBC: 372 ug/dL (ref 250–450)
UIBC: 320 ug/dL

## 2023-11-27 LAB — FERRITIN: Ferritin: 24 ng/mL (ref 11–307)

## 2023-11-27 MED ORDER — DARBEPOETIN ALFA 150 MCG/0.3ML IJ SOSY
150.0000 ug | PREFILLED_SYRINGE | Freq: Once | INTRAMUSCULAR | Status: AC
  Administered 2023-11-27: 150 ug via SUBCUTANEOUS
  Filled 2023-11-27: qty 0.3

## 2023-11-27 MED ORDER — ESOMEPRAZOLE MAGNESIUM 40 MG PO CPDR: 40.0000 mg | DELAYED_RELEASE_CAPSULE | Freq: Every day | ORAL | 2 refills | Status: DC

## 2023-11-27 NOTE — Telephone Encounter (Signed)
 Niece asked if Dr. Alena Bills would send in a nexium for 2 months until she says a new PCP in april

## 2023-11-27 NOTE — Telephone Encounter (Signed)
 Nexium sent to patient's pharmacy. I spoke with pt's niece. Made her aware that med was sent to pharmacy. She inquired if [name] received her aranesp injection. I told her yes, the hgb today was 9.4. (improving, but she still need the injection).

## 2023-11-30 ENCOUNTER — Telehealth: Payer: Self-pay | Admitting: Internal Medicine

## 2023-11-30 ENCOUNTER — Ambulatory Visit: Payer: Medicare Other | Admitting: Family Medicine

## 2023-11-30 LAB — OPHTHALMOLOGY REPORT-SCANNED

## 2023-11-30 NOTE — Telephone Encounter (Signed)
 I left a message with the patient's niece about the lab results.  Patient has provided permission to discuss information with her.  Reviewed labs.  Advised to start Slow Fe once a day with vitamin C with breakfast.

## 2023-12-04 DIAGNOSIS — I7025 Atherosclerosis of native arteries of other extremities with ulceration: Secondary | ICD-10-CM | POA: Insufficient documentation

## 2023-12-04 NOTE — Progress Notes (Unsigned)
 MRN : [id number]  [name] is a 83 y.o. ([birth year]) female who presents with chief complaint of check access.  History of Present Illness:   The patient returns to the office for followup of their dialysis access.    The dialysis access was created on 05/19/2022 she is still not yet on dialysis.  Access is not being used.   The patient denies redness or swelling at the access site. The patient denies fever or chills at home or while on dialysis.   The patient is also being treated for a right lower extremity wound.    The patient denies amaurosis fugax or recent TIA symptoms. There are no recent neurological changes noted. There is no history of DVT, PE or superficial thrombophlebitis. No recent episodes of angina or shortness of breath documented.    Duplex ultrasound of the AV access shows a patent access.  The previously noted stenosis is not significantly changed compared to last study.  Flow volume today is 1633 cc/min (previous flow volume was 1741 cc/min)  No outpatient medications have been marked as taking for the 12/06/23 encounter (Appointment) with Gilda Crease, Latina Craver, MD.    Past Medical History:  Diagnosis Date   Actinic keratosis    Albuminuria    Anemia    Arthritis    Asthma    Basal cell carcinoma 04/12/2017   Above right lateral brow. Nodulocystic type. EDC   Cancer (HCC)    skin   Cataract cortical, senile    CHF (congestive heart failure) (HCC)    Diabetes mellitus without complication (HCC)    GERD (gastroesophageal reflux disease)    Hemorrhoids    History of kidney stones    Hyperlipidemia    Hypertension    Hypothyroidism    Lyme disease    No kidney function    OSA (obstructive sleep apnea)    Osteoporosis    Osteoporosis    Reflux esophagitis    Steatohepatitis    Steatohepatitis     Past Surgical History:  Procedure Laterality Date   ABDOMINAL HYSTERECTOMY     APPENDECTOMY     AV FISTULA PLACEMENT Left  05/19/2022   Procedure: ARTERIOVENOUS (AV) FISTULA CREATION ( BRACHIAL CEPHALIC );  Surgeon: Renford Dills, MD;  Location: [facility] ORS;  Service: Vascular;  Laterality: Left;   CARDIAC CATHETERIZATION  1980   [facility]   CARDIAC CATHETERIZATION  08/13/2014   [facility]. no significant CAD, normal LVEDP.    CATARACT EXTRACTION     CHOLECYSTECTOMY     COLONOSCOPY     COLONOSCOPY WITH PROPOFOL N/A 12/07/2016   Procedure: COLONOSCOPY WITH PROPOFOL;  Surgeon: Christena Deem, MD;  Location: [facility] ENDOSCOPY;  Service: Endoscopy;  Laterality: N/A;   ESOPHAGOGASTRODUODENOSCOPY     ESOPHAGOGASTRODUODENOSCOPY (EGD) WITH PROPOFOL N/A 12/07/2016   Procedure: ESOPHAGOGASTRODUODENOSCOPY (EGD) WITH PROPOFOL;  Surgeon: Christena Deem, MD;  Location: [facility] ENDOSCOPY;  Service: Endoscopy;  Laterality: N/A;   ESOPHAGOGASTRODUODENOSCOPY (EGD) WITH PROPOFOL N/A 01/07/2018   Procedure: ESOPHAGOGASTRODUODENOSCOPY (EGD) WITH PROPOFOL;  Surgeon: Christena Deem, MD;  Location: [facility] ENDOSCOPY;  Service: Endoscopy;  Laterality: N/A;   EYE SURGERY     HEMATOMA EVACUATION Right 08/02/2023   Procedure: EVACUATION HEMATOMA;  Surgeon: Leafy Ro, MD;  Location: [facility] ORS;  Service: General;  Laterality: Right;   HEMORRHOID SURGERY     PARTIAL HYSTERECTOMY  THYROIDECTOMY N/A 08/25/2019   Procedure: THYROIDECTOMY EXTRACTION OF SUBTOTAL COMPONENT; PARATHYROID AUTOTRANSPLANT X1;  Surgeon: Duanne Guess, MD;  Location: [facility] ORS;  Service: General;  Laterality: N/A;  With Nerve Monitoring(RLN)   TONSILLECTOMY      Social History Social History   Tobacco Use   Smoking status: Never   Smokeless tobacco: Never  Vaping Use   Vaping status: Never Used  Substance Use Topics   Alcohol use: No   Drug use: No    Family History Family History  Problem Relation Age of Onset   Breast cancer Mother    Heart attack Father     Allergies  Allergen Reactions   Ace Inhibitors     Other reaction(s): Unknown   Egg-Derived  Products Diarrhea   Other     Other reaction(s): Other (See Comments) Eggs   Prednisone     Other reaction(s): Other (See Comments) joint pain   Risedronate     Other reaction(s): Other (See Comments)   Sulfa Antibiotics Itching and Swelling    Other reaction(s): Other (See Comments)   Sulfasalazine Other (See Comments)     REVIEW OF SYSTEMS (Negative unless checked)  Constitutional: [] Weight loss  [] Fever  [] Chills Cardiac: [] Chest pain   [] Chest pressure   [] Palpitations   [] Shortness of breath when laying flat   [] Shortness of breath with exertion. Vascular:  [] Pain in legs with walking   [] Pain in legs at rest  [] History of DVT   [] Phlebitis   [] Swelling in legs   [] Varicose veins   [] Non-healing ulcers Pulmonary:   [] Uses home oxygen   [] Productive cough   [] Hemoptysis   [] Wheeze  [] COPD   [] Asthma Neurologic:  [] Dizziness   [] Seizures   [] History of stroke   [] History of TIA  [] Aphasia   [] Vissual changes   [] Weakness or numbness in arm   [] Weakness or numbness in leg Musculoskeletal:   [] Joint swelling   [] Joint pain   [] Low back pain Hematologic:  [] Easy bruising  [] Easy bleeding   [] Hypercoagulable state   [] Anemic Gastrointestinal:  [] Diarrhea   [] Vomiting  [] Gastroesophageal reflux/heartburn   [] Difficulty swallowing. Genitourinary:  [x] Chronic kidney disease   [] Difficult urination  [] Frequent urination   [] Blood in urine Skin:  [] Rashes   [] Ulcers  Psychological:  [] History of anxiety   []  History of major depression.  Physical Examination  There were no vitals filed for this visit. There is no height or weight on file to calculate BMI. Gen: WD/WN, NAD Head: [place]/AT, No temporalis wasting.  Ear/Nose/Throat: Hearing grossly intact, nares w/o erythema or drainage Eyes: PER, EOMI, sclera nonicteric.  Neck: Supple, no gross masses or lesions.  No JVD.  Pulmonary:  Good air movement, no audible wheezing, no use of accessory muscles.  Cardiac: RRR, precordium  non-hyperdynamic. Vascular:   *** Vessel Right Left  Radial Palpable Palpable  Brachial Palpable Palpable  Gastrointestinal: soft, non-distended. No guarding/no peritoneal signs.  Musculoskeletal: M/S 5/5 throughout.  No deformity.  Neurologic: CN 2-12 intact. Pain and light touch intact in extremities.  Symmetrical.  Speech is fluent. Motor exam as listed above. Psychiatric: Judgment intact, Mood & affect appropriate for pt's clinical situation. Dermatologic: No rashes or ulcers noted.  No changes consistent with cellulitis.   CBC Lab Results  Component Value Date   WBC 7.1 11/27/2023   HGB 9.4 (L) 11/27/2023   HCT 29.2 (L) 11/27/2023   MCV 85.1 11/27/2023   PLT 193 11/27/2023    BMET    Component  Value Date/Time   NA 136 08/06/2023 0420   NA 141 08/11/2014 0850   NA 142 06/20/2012 1353   K 4.9 08/06/2023 0420   K 3.2 (L) 06/20/2012 1353   CL 102 08/06/2023 0420   CL 103 06/20/2012 1353   CO2 24 08/06/2023 0420   CO2 29 06/20/2012 1353   GLUCOSE 88 08/06/2023 0420   GLUCOSE 84 06/20/2012 1353   BUN 79 (H) 08/06/2023 0420   BUN 16 08/11/2014 0850   BUN 13 06/20/2012 1353   CREATININE 3.26 (H) 08/06/2023 0420   CREATININE 0.65 06/20/2012 1353   CALCIUM 9.4 08/06/2023 0420   CALCIUM 9.4 06/20/2012 1353   GFRNONAA 14 (L) 08/06/2023 0420   GFRNONAA >60 06/20/2012 1353   GFRAA >60 12/31/2016 0318   GFRAA >60 06/20/2012 1353   CrCl cannot be calculated (Patient's most recent lab result is older than the maximum 21 days allowed.).  COAG Lab Results  Component Value Date   INR 1.2 08/03/2023   INR 1.6 (H) 07/29/2023   INR 1.1 08/11/2014    Radiology No results found.   Assessment/Plan There are no diagnoses linked to this encounter.   Levora Dredge, MD  12/04/2023 12:00 PM

## 2023-12-05 ENCOUNTER — Other Ambulatory Visit (INDEPENDENT_AMBULATORY_CARE_PROVIDER_SITE_OTHER): Payer: Self-pay | Admitting: Vascular Surgery

## 2023-12-05 DIAGNOSIS — I7025 Atherosclerosis of native arteries of other extremities with ulceration: Secondary | ICD-10-CM

## 2023-12-06 ENCOUNTER — Ambulatory Visit (INDEPENDENT_AMBULATORY_CARE_PROVIDER_SITE_OTHER)

## 2023-12-06 ENCOUNTER — Ambulatory Visit: Payer: Medicare Other | Admitting: Family Medicine

## 2023-12-06 ENCOUNTER — Ambulatory Visit (INDEPENDENT_AMBULATORY_CARE_PROVIDER_SITE_OTHER): Payer: Medicare Other

## 2023-12-06 ENCOUNTER — Encounter (INDEPENDENT_AMBULATORY_CARE_PROVIDER_SITE_OTHER): Payer: Self-pay | Admitting: Vascular Surgery

## 2023-12-06 ENCOUNTER — Ambulatory Visit (INDEPENDENT_AMBULATORY_CARE_PROVIDER_SITE_OTHER): Payer: Medicare Other | Admitting: Vascular Surgery

## 2023-12-06 ENCOUNTER — Encounter: Payer: Medicare Other | Attending: Physician Assistant | Admitting: Physician Assistant

## 2023-12-06 VITALS — BP 178/60 | HR 57 | Resp 18 | Ht 62.0 in | Wt 160.2 lb

## 2023-12-06 DIAGNOSIS — I7025 Atherosclerosis of native arteries of other extremities with ulceration: Secondary | ICD-10-CM | POA: Diagnosis not present

## 2023-12-06 DIAGNOSIS — I10 Essential (primary) hypertension: Secondary | ICD-10-CM | POA: Insufficient documentation

## 2023-12-06 DIAGNOSIS — N185 Chronic kidney disease, stage 5: Secondary | ICD-10-CM | POA: Diagnosis not present

## 2023-12-06 DIAGNOSIS — S81811A Laceration without foreign body, right lower leg, initial encounter: Secondary | ICD-10-CM | POA: Insufficient documentation

## 2023-12-06 DIAGNOSIS — L97812 Non-pressure chronic ulcer of other part of right lower leg with fat layer exposed: Secondary | ICD-10-CM | POA: Insufficient documentation

## 2023-12-06 DIAGNOSIS — E11621 Type 2 diabetes mellitus with foot ulcer: Secondary | ICD-10-CM | POA: Insufficient documentation

## 2023-12-06 DIAGNOSIS — E1169 Type 2 diabetes mellitus with other specified complication: Secondary | ICD-10-CM

## 2023-12-06 DIAGNOSIS — E785 Hyperlipidemia, unspecified: Secondary | ICD-10-CM

## 2023-12-06 DIAGNOSIS — E669 Obesity, unspecified: Secondary | ICD-10-CM

## 2023-12-06 DIAGNOSIS — J45909 Unspecified asthma, uncomplicated: Secondary | ICD-10-CM

## 2023-12-06 DIAGNOSIS — E11622 Type 2 diabetes mellitus with other skin ulcer: Secondary | ICD-10-CM | POA: Diagnosis present

## 2023-12-07 ENCOUNTER — Ambulatory Visit: Payer: Self-pay | Admitting: Family Medicine

## 2023-12-07 LAB — VAS US ABI WITH/WO TBI
Left ABI: 1.37
Right ABI: 1.21

## 2023-12-19 ENCOUNTER — Ambulatory Visit: Admitting: Physician Assistant

## 2023-12-20 ENCOUNTER — Ambulatory Visit: Payer: Medicare Other

## 2023-12-20 ENCOUNTER — Inpatient Hospital Stay: Payer: Medicare Other

## 2023-12-20 ENCOUNTER — Other Ambulatory Visit: Payer: Medicare Other

## 2023-12-20 ENCOUNTER — Telehealth: Payer: Self-pay | Admitting: Internal Medicine

## 2023-12-20 NOTE — Telephone Encounter (Signed)
 Patient is running fever and request to change lab/injection appointment to Monday. Appointments rescheduled as requested.

## 2023-12-21 ENCOUNTER — Inpatient Hospital Stay
Admission: EM | Admit: 2023-12-21 | Discharge: 2024-01-01 | DRG: 166 | Disposition: A | Discharge status: Home Health | Attending: Hospitalist | Admitting: Hospitalist

## 2023-12-21 ENCOUNTER — Emergency Department

## 2023-12-21 ENCOUNTER — Other Ambulatory Visit: Payer: Self-pay

## 2023-12-21 DIAGNOSIS — Z888 Allergy status to other drugs, medicaments and biological substances status: Secondary | ICD-10-CM

## 2023-12-21 DIAGNOSIS — E039 Hypothyroidism, unspecified: Secondary | ICD-10-CM | POA: Diagnosis not present

## 2023-12-21 DIAGNOSIS — Z9049 Acquired absence of other specified parts of digestive tract: Secondary | ICD-10-CM

## 2023-12-21 DIAGNOSIS — Z7901 Long term (current) use of anticoagulants: Secondary | ICD-10-CM | POA: Diagnosis not present

## 2023-12-21 DIAGNOSIS — I10 Essential (primary) hypertension: Secondary | ICD-10-CM | POA: Diagnosis not present

## 2023-12-21 DIAGNOSIS — T82858A Stenosis of vascular prosthetic devices, implants and grafts, initial encounter: Secondary | ICD-10-CM | POA: Diagnosis not present

## 2023-12-21 DIAGNOSIS — K7581 Nonalcoholic steatohepatitis (NASH): Secondary | ICD-10-CM | POA: Diagnosis present

## 2023-12-21 DIAGNOSIS — I871 Compression of vein: Secondary | ICD-10-CM | POA: Diagnosis present

## 2023-12-21 DIAGNOSIS — Z8249 Family history of ischemic heart disease and other diseases of the circulatory system: Secondary | ICD-10-CM

## 2023-12-21 DIAGNOSIS — K219 Gastro-esophageal reflux disease without esophagitis: Secondary | ICD-10-CM | POA: Insufficient documentation

## 2023-12-21 DIAGNOSIS — E1122 Type 2 diabetes mellitus with diabetic chronic kidney disease: Secondary | ICD-10-CM | POA: Diagnosis present

## 2023-12-21 DIAGNOSIS — Z79890 Hormone replacement therapy (postmenopausal): Secondary | ICD-10-CM

## 2023-12-21 DIAGNOSIS — Z8616 Personal history of COVID-19: Secondary | ICD-10-CM

## 2023-12-21 DIAGNOSIS — Z91012 Allergy to eggs: Secondary | ICD-10-CM

## 2023-12-21 DIAGNOSIS — R0602 Shortness of breath: Secondary | ICD-10-CM | POA: Diagnosis present

## 2023-12-21 DIAGNOSIS — Z66 Do not resuscitate: Secondary | ICD-10-CM | POA: Diagnosis present

## 2023-12-21 DIAGNOSIS — J9601 Acute respiratory failure with hypoxia: Secondary | ICD-10-CM | POA: Diagnosis present

## 2023-12-21 DIAGNOSIS — I5033 Acute on chronic diastolic (congestive) heart failure: Secondary | ICD-10-CM | POA: Diagnosis present

## 2023-12-21 DIAGNOSIS — E890 Postprocedural hypothyroidism: Secondary | ICD-10-CM | POA: Diagnosis present

## 2023-12-21 DIAGNOSIS — R0902 Hypoxemia: Secondary | ICD-10-CM

## 2023-12-21 DIAGNOSIS — Z87442 Personal history of urinary calculi: Secondary | ICD-10-CM

## 2023-12-21 DIAGNOSIS — D696 Thrombocytopenia, unspecified: Secondary | ICD-10-CM | POA: Diagnosis present

## 2023-12-21 DIAGNOSIS — J440 Chronic obstructive pulmonary disease with acute lower respiratory infection: Secondary | ICD-10-CM | POA: Diagnosis present

## 2023-12-21 DIAGNOSIS — L570 Actinic keratosis: Secondary | ICD-10-CM | POA: Diagnosis present

## 2023-12-21 DIAGNOSIS — I132 Hypertensive heart and chronic kidney disease with heart failure and with stage 5 chronic kidney disease, or end stage renal disease: Secondary | ICD-10-CM | POA: Diagnosis present

## 2023-12-21 DIAGNOSIS — J189 Pneumonia, unspecified organism: Principal | ICD-10-CM | POA: Diagnosis present

## 2023-12-21 DIAGNOSIS — Z882 Allergy status to sulfonamides status: Secondary | ICD-10-CM

## 2023-12-21 DIAGNOSIS — Y712 Prosthetic and other implants, materials and accessory cardiovascular devices associated with adverse incidents: Secondary | ICD-10-CM | POA: Diagnosis not present

## 2023-12-21 DIAGNOSIS — E876 Hypokalemia: Secondary | ICD-10-CM | POA: Diagnosis present

## 2023-12-21 DIAGNOSIS — E785 Hyperlipidemia, unspecified: Secondary | ICD-10-CM | POA: Diagnosis present

## 2023-12-21 DIAGNOSIS — I480 Paroxysmal atrial fibrillation: Secondary | ICD-10-CM | POA: Insufficient documentation

## 2023-12-21 DIAGNOSIS — Z992 Dependence on renal dialysis: Secondary | ICD-10-CM | POA: Diagnosis not present

## 2023-12-21 DIAGNOSIS — T82510A Breakdown (mechanical) of surgically created arteriovenous fistula, initial encounter: Secondary | ICD-10-CM | POA: Diagnosis not present

## 2023-12-21 DIAGNOSIS — F32A Depression, unspecified: Secondary | ICD-10-CM | POA: Diagnosis present

## 2023-12-21 DIAGNOSIS — J441 Chronic obstructive pulmonary disease with (acute) exacerbation: Secondary | ICD-10-CM | POA: Diagnosis present

## 2023-12-21 DIAGNOSIS — M810 Age-related osteoporosis without current pathological fracture: Secondary | ICD-10-CM | POA: Diagnosis present

## 2023-12-21 DIAGNOSIS — M1990 Unspecified osteoarthritis, unspecified site: Secondary | ICD-10-CM | POA: Diagnosis present

## 2023-12-21 DIAGNOSIS — Z85828 Personal history of other malignant neoplasm of skin: Secondary | ICD-10-CM

## 2023-12-21 DIAGNOSIS — N186 End stage renal disease: Secondary | ICD-10-CM | POA: Diagnosis present

## 2023-12-21 DIAGNOSIS — Z90711 Acquired absence of uterus with remaining cervical stump: Secondary | ICD-10-CM

## 2023-12-21 DIAGNOSIS — D631 Anemia in chronic kidney disease: Secondary | ICD-10-CM | POA: Diagnosis present

## 2023-12-21 DIAGNOSIS — G4733 Obstructive sleep apnea (adult) (pediatric): Secondary | ICD-10-CM | POA: Diagnosis present

## 2023-12-21 DIAGNOSIS — R040 Epistaxis: Secondary | ICD-10-CM | POA: Diagnosis not present

## 2023-12-21 DIAGNOSIS — Z7951 Long term (current) use of inhaled steroids: Secondary | ICD-10-CM

## 2023-12-21 DIAGNOSIS — Z79899 Other long term (current) drug therapy: Secondary | ICD-10-CM

## 2023-12-21 LAB — RESP PANEL BY RT-PCR (RSV, FLU A&B, COVID)  RVPGX2
Influenza A by PCR: NEGATIVE
Influenza B by PCR: NEGATIVE
Resp Syncytial Virus by PCR: NEGATIVE
SARS Coronavirus 2 by RT PCR: NEGATIVE

## 2023-12-21 LAB — PROTIME-INR
INR: 1.4 — ABNORMAL HIGH (ref 0.8–1.2)
Prothrombin Time: 17.6 s — ABNORMAL HIGH (ref 11.4–15.2)

## 2023-12-21 LAB — CBC
HCT: 27.6 % — ABNORMAL LOW (ref 36.0–46.0)
HCT: 27.3 % — ABNORMAL LOW (ref 36.0–46.0)
Hemoglobin: 8.9 g/dL — ABNORMAL LOW (ref 12.0–15.0)
Hemoglobin: 8.9 g/dL — ABNORMAL LOW (ref 12.0–15.0)
MCH: 28.2 pg (ref 26.0–34.0)
MCH: 28.3 pg (ref 26.0–34.0)
MCHC: 32.2 g/dL (ref 30.0–36.0)
MCHC: 32.6 g/dL (ref 30.0–36.0)
MCV: 87.3 fL (ref 80.0–100.0)
MCV: 86.9 fL (ref 80.0–100.0)
Platelets: 145 10*3/uL — ABNORMAL LOW (ref 150–400)
Platelets: 129 10*3/uL — ABNORMAL LOW (ref 150–400)
RBC: 3.16 MIL/uL — ABNORMAL LOW (ref 3.87–5.11)
RBC: 3.14 MIL/uL — ABNORMAL LOW (ref 3.87–5.11)
RDW: 16.7 % — ABNORMAL HIGH (ref 11.5–15.5)
RDW: 16.9 % — ABNORMAL HIGH (ref 11.5–15.5)
WBC: 6.9 10*3/uL (ref 4.0–10.5)
WBC: 6.3 10*3/uL (ref 4.0–10.5)
nRBC: 0 % (ref 0.0–0.2)
nRBC: 0 % (ref 0.0–0.2)

## 2023-12-21 LAB — BASIC METABOLIC PANEL
Anion gap: 10 (ref 5–15)
BUN: 63 mg/dL — ABNORMAL HIGH (ref 8–23)
CO2: 21 mmol/L — ABNORMAL LOW (ref 22–32)
Calcium: 11.1 mg/dL — ABNORMAL HIGH (ref 8.9–10.3)
Chloride: 105 mmol/L (ref 98–111)
Creatinine, Ser: 2.3 mg/dL — ABNORMAL HIGH (ref 0.44–1.00)
GFR, Estimated: 21 mL/min — ABNORMAL LOW (ref >60)
Glucose, Bld: 135 mg/dL — ABNORMAL HIGH (ref 70–99)
Potassium: 3.2 mmol/L — ABNORMAL LOW (ref 3.5–5.1)
Sodium: 136 mmol/L (ref 135–145)

## 2023-12-21 LAB — LACTIC ACID, PLASMA
Lactic Acid, Venous: 1.3 mmol/L (ref 0.5–1.9)
Lactic Acid, Venous: 1.4 mmol/L (ref 0.5–1.9)
Lactic Acid, Venous: 1.7 mmol/L (ref 0.5–1.9)

## 2023-12-21 LAB — CREATININE, SERUM
Creatinine, Ser: 2.26 mg/dL — ABNORMAL HIGH (ref 0.44–1.00)
GFR, Estimated: 21 mL/min — ABNORMAL LOW (ref >60)

## 2023-12-21 LAB — APTT: aPTT: 38 s — ABNORMAL HIGH (ref 24–36)

## 2023-12-21 LAB — TROPONIN I (HIGH SENSITIVITY)
Troponin I (High Sensitivity): 87 ng/L — ABNORMAL HIGH (ref <18)
Troponin I (High Sensitivity): 102 ng/L (ref <18)

## 2023-12-21 MED ORDER — ENOXAPARIN SODIUM 30 MG/0.3ML IJ SOSY
30.0000 mg | PREFILLED_SYRINGE | INTRAMUSCULAR | Status: DC
  Administered 2023-12-21: 30 mg via SUBCUTANEOUS
  Filled 2023-12-21: qty 0.3

## 2023-12-21 MED ORDER — TRAZODONE HCL 50 MG PO TABS
25.0000 mg | ORAL_TABLET | Freq: Every evening | ORAL | Status: DC | PRN
  Administered 2023-12-22 – 2023-12-26 (×3): 25 mg via ORAL
  Filled 2023-12-21 (×3): qty 1

## 2023-12-21 MED ORDER — MONTELUKAST SODIUM 10 MG PO TABS
10.0000 mg | ORAL_TABLET | Freq: Every day | ORAL | Status: DC
  Administered 2023-12-21 – 2023-12-31 (×11): 10 mg via ORAL
  Filled 2023-12-21 (×11): qty 1

## 2023-12-21 MED ORDER — OYSTER SHELL CALCIUM/D3 500-5 MG-MCG PO TABS
1.0000 | ORAL_TABLET | Freq: Every day | ORAL | Status: DC
  Administered 2023-12-22 – 2024-01-01 (×11): 1 via ORAL
  Filled 2023-12-21 (×11): qty 1

## 2023-12-21 MED ORDER — CYANOCOBALAMIN 500 MCG PO TABS
500.0000 ug | ORAL_TABLET | Freq: Every day | ORAL | Status: DC
  Administered 2023-12-22 – 2024-01-01 (×11): 500 ug via ORAL
  Filled 2023-12-21 (×11): qty 1

## 2023-12-21 MED ORDER — SODIUM CHLORIDE 0.9 % IV SOLN
500.0000 mg | Freq: Once | INTRAVENOUS | Status: AC
  Administered 2023-12-21: 500 mg via INTRAVENOUS
  Filled 2023-12-21 (×2): qty 5

## 2023-12-21 MED ORDER — ASPIRIN 325 MG PO TABS
325.0000 mg | ORAL_TABLET | Freq: Once | ORAL | Status: AC
  Administered 2023-12-21: 325 mg via ORAL
  Filled 2023-12-21: qty 1

## 2023-12-21 MED ORDER — ROSUVASTATIN CALCIUM 10 MG PO TABS
40.0000 mg | ORAL_TABLET | Freq: Every day | ORAL | Status: DC
  Administered 2023-12-22 – 2023-12-25 (×4): 40 mg via ORAL
  Filled 2023-12-21: qty 4
  Filled 2023-12-21: qty 2
  Filled 2023-12-21 (×2): qty 4

## 2023-12-21 MED ORDER — IPRATROPIUM-ALBUTEROL 0.5-2.5 (3) MG/3ML IN SOLN
3.0000 mL | Freq: Four times a day (QID) | RESPIRATORY_TRACT | Status: DC
  Administered 2023-12-22 – 2023-12-25 (×12): 3 mL via RESPIRATORY_TRACT
  Filled 2023-12-21 (×12): qty 3

## 2023-12-21 MED ORDER — LEVOTHYROXINE SODIUM 50 MCG PO TABS: 150.0000 ug | ORAL_TABLET | Freq: Every day | ORAL | Status: DC

## 2023-12-21 MED ORDER — ONDANSETRON HCL 4 MG/2ML IJ SOLN: 4.0000 mg | Freq: Four times a day (QID) | INTRAMUSCULAR | Status: DC | PRN

## 2023-12-21 MED ORDER — SODIUM CHLORIDE 0.9 % IV SOLN: 500.0000 mg | INTRAVENOUS | Status: DC

## 2023-12-21 MED ORDER — SODIUM CHLORIDE 0.9 % IV SOLN: 2.0000 g | INTRAVENOUS | Status: DC

## 2023-12-21 MED ORDER — ALLOPURINOL 100 MG PO TABS
100.0000 mg | ORAL_TABLET | Freq: Every day | ORAL | Status: DC
  Administered 2023-12-22: 100 mg via ORAL
  Filled 2023-12-21: qty 1

## 2023-12-21 MED ORDER — ENOXAPARIN SODIUM 40 MG/0.4ML IJ SOSY: 40.0000 mg | PREFILLED_SYRINGE | INTRAMUSCULAR | Status: DC

## 2023-12-21 MED ORDER — METOPROLOL TARTRATE 50 MG PO TABS
50.0000 mg | ORAL_TABLET | Freq: Two times a day (BID) | ORAL | Status: DC
  Administered 2023-12-21 – 2023-12-31 (×14): 50 mg via ORAL
  Filled 2023-12-21 (×3): qty 1
  Filled 2023-12-21: qty 2
  Filled 2023-12-21 (×8): qty 1
  Filled 2023-12-21: qty 2
  Filled 2023-12-21 (×7): qty 1

## 2023-12-21 MED ORDER — ACETAMINOPHEN 325 MG PO TABS
650.0000 mg | ORAL_TABLET | Freq: Four times a day (QID) | ORAL | Status: DC | PRN
  Administered 2023-12-23 – 2023-12-30 (×3): 650 mg via ORAL
  Filled 2023-12-21 (×3): qty 2

## 2023-12-21 MED ORDER — IPRATROPIUM-ALBUTEROL 0.5-2.5 (3) MG/3ML IN SOLN
6.0000 mL | Freq: Once | RESPIRATORY_TRACT | Status: AC
  Administered 2023-12-21: 6 mL via RESPIRATORY_TRACT
  Filled 2023-12-21: qty 6

## 2023-12-21 MED ORDER — FUROSEMIDE 10 MG/ML IJ SOLN
40.0000 mg | Freq: Once | INTRAMUSCULAR | Status: AC
  Administered 2023-12-21: 40 mg via INTRAVENOUS
  Filled 2023-12-21: qty 4

## 2023-12-21 MED ORDER — SODIUM CHLORIDE 0.9 % IV SOLN
2.0000 g | INTRAVENOUS | Status: AC
  Administered 2023-12-22 – 2023-12-25 (×4): 2 g via INTRAVENOUS
  Filled 2023-12-21 (×4): qty 20

## 2023-12-21 MED ORDER — PANTOPRAZOLE SODIUM 40 MG PO TBEC
40.0000 mg | DELAYED_RELEASE_TABLET | Freq: Every day | ORAL | Status: DC
  Administered 2023-12-22 – 2024-01-01 (×11): 40 mg via ORAL
  Filled 2023-12-21 (×11): qty 1

## 2023-12-21 MED ORDER — FUROSEMIDE 40 MG PO TABS
40.0000 mg | ORAL_TABLET | Freq: Two times a day (BID) | ORAL | Status: DC
  Administered 2023-12-22 – 2023-12-24 (×5): 40 mg via ORAL
  Filled 2023-12-21 (×5): qty 1

## 2023-12-21 MED ORDER — GUAIFENESIN ER 600 MG PO TB12
600.0000 mg | ORAL_TABLET | Freq: Two times a day (BID) | ORAL | Status: DC
  Administered 2023-12-21 – 2024-01-01 (×22): 600 mg via ORAL
  Filled 2023-12-21 (×22): qty 1

## 2023-12-21 MED ORDER — SERTRALINE HCL 50 MG PO TABS
100.0000 mg | ORAL_TABLET | Freq: Every day | ORAL | Status: DC
  Administered 2023-12-22 – 2024-01-01 (×11): 100 mg via ORAL
  Filled 2023-12-21 (×11): qty 2

## 2023-12-21 MED ORDER — HYDRALAZINE HCL 50 MG PO TABS
100.0000 mg | ORAL_TABLET | Freq: Three times a day (TID) | ORAL | Status: DC
  Administered 2023-12-21 – 2024-01-01 (×28): 100 mg via ORAL
  Filled 2023-12-21 (×31): qty 2

## 2023-12-21 MED ORDER — SODIUM CHLORIDE 0.9 % IV SOLN: INTRAVENOUS | Status: AC

## 2023-12-21 MED ORDER — ONDANSETRON HCL 4 MG PO TABS: 4.0000 mg | ORAL_TABLET | Freq: Four times a day (QID) | ORAL | Status: DC | PRN

## 2023-12-21 MED ORDER — MAGNESIUM HYDROXIDE 400 MG/5ML PO SUSP: 30.0000 mL | Freq: Every day | ORAL | Status: DC | PRN

## 2023-12-21 MED ORDER — METHYLPREDNISOLONE SODIUM SUCC 125 MG IJ SOLR
125.0000 mg | Freq: Once | INTRAMUSCULAR | Status: AC
  Administered 2023-12-21: 125 mg via INTRAVENOUS
  Filled 2023-12-21: qty 2

## 2023-12-21 MED ORDER — POTASSIUM CHLORIDE CRYS ER 10 MEQ PO TBCR
10.0000 meq | EXTENDED_RELEASE_TABLET | Freq: Every day | ORAL | Status: DC
  Administered 2023-12-22 – 2023-12-25 (×4): 10 meq via ORAL
  Filled 2023-12-21 (×4): qty 1

## 2023-12-21 MED ORDER — ACETAMINOPHEN 650 MG RE SUPP: 650.0000 mg | Freq: Four times a day (QID) | RECTAL | Status: DC | PRN

## 2023-12-21 MED ORDER — AMLODIPINE BESYLATE 5 MG PO TABS
5.0000 mg | ORAL_TABLET | Freq: Every day | ORAL | Status: DC
  Administered 2023-12-22 – 2024-01-01 (×11): 5 mg via ORAL
  Filled 2023-12-21 (×11): qty 1

## 2023-12-21 MED ORDER — IPRATROPIUM-ALBUTEROL 0.5-2.5 (3) MG/3ML IN SOLN
3.0000 mL | Freq: Once | RESPIRATORY_TRACT | Status: AC
  Administered 2023-12-21: 3 mL via RESPIRATORY_TRACT
  Filled 2023-12-21: qty 3

## 2023-12-21 MED ORDER — SODIUM CHLORIDE 0.9 % IV SOLN
2.0000 g | Freq: Once | INTRAVENOUS | Status: AC
  Administered 2023-12-21: 2 g via INTRAVENOUS
  Filled 2023-12-21: qty 20

## 2023-12-21 MED ORDER — APIXABAN 2.5 MG PO TABS
2.5000 mg | ORAL_TABLET | Freq: Two times a day (BID) | ORAL | Status: DC
  Administered 2023-12-22 – 2024-01-01 (×20): 2.5 mg via ORAL
  Filled 2023-12-21 (×21): qty 1

## 2023-12-21 MED ORDER — HYDROCOD POLI-CHLORPHE POLI ER 10-8 MG/5ML PO SUER: 5.0000 mL | Freq: Two times a day (BID) | ORAL | Status: DC | PRN

## 2023-12-21 NOTE — ED Triage Notes (Signed)
 Pt arrives via EMS from home for SOB. Pt started feeling sick and has had a cough since 12/14/23 and has had a fever since 12/18/23. Pt took 1000 mg of tylenol at home today around 1230. EMS found pt at 90% on RA and came back up to 93% on 4L [place]. Pt is supposed to start dialysis but it has not been set up yet.  EMS vitals: 41 RR 166/78 BP 167 CBG

## 2023-12-21 NOTE — ED Notes (Signed)
 Lab at bedside

## 2023-12-21 NOTE — Assessment & Plan Note (Signed)
-   We will place the patient IV steroid therapy with IV Solu-Medrol as well as nebulized bronchodilator therapy with duonebs q.i.d. and q.4 hours p.r.n.Marland Kitchen - Mucolytic therapy will be provided with Mucinex and antibiotic therapy with IV Rocephin and Zithromax. - O2 protocol will be followed.

## 2023-12-21 NOTE — Assessment & Plan Note (Signed)
-   We will replace potassium and follow level. 

## 2023-12-21 NOTE — Assessment & Plan Note (Signed)
 -  We will continue statin therapy.

## 2023-12-21 NOTE — Assessment & Plan Note (Signed)
-   This is clearly secondary to #1 and #2. - Management as above. - O2 protocol will be followed.

## 2023-12-21 NOTE — Progress Notes (Signed)
 PHARMACIST - PHYSICIAN COMMUNICATION  CONCERNING:  Enoxaparin (Lovenox) for DVT Prophylaxis    RECOMMENDATION: Patient was prescribed enoxaprin 40mg  q24 hours for VTE prophylaxis.   Filed Weights   12/21/23 2014  Weight: 85.9 kg (189 lb 6 oz)    Body mass index is 34.64 kg/m.  Estimated Creatinine Clearance: 19.2 mL/min (A) (by C-G formula based on SCr of 2.3 mg/dL (H)).   Based on [facility] policy patient is candidate for enoxaparin 30mg  every 24 hours based on CrCl <58ml/min  DESCRIPTION: Pharmacy has adjusted enoxaparin dose per [facility] policy.  Patient is now receiving enoxaparin 30 mg every 24 hours    Lowella Bandy, PharmD Clinical Pharmacist  12/21/2023 8:21 PM

## 2023-12-21 NOTE — H&P (Signed)
 [place]   PATIENT NAME: [name]    MR#:  [id number]  DATE OF BIRTH:  1940-12-24  DATE OF ADMISSION:  12/21/2023  PRIMARY CARE PHYSICIAN: Patient, No Pcp Per   Patient is coming from: Home  REQUESTING/REFERRING PHYSICIAN: Trinna Post, MD  CHIEF COMPLAINT:   Chief Complaint  Patient presents with   Shortness of Breath    HISTORY OF PRESENT ILLNESS:  [name] is a 83 y.o. female with medical history significant for asthma, osteoarthritis CHF, type 2 diabetes mellitus, GERD, hypertension, dyslipidemia, hypothyroidism, OSA, stage IV chronic kidney disease, and hypothyroidism who presented to the emergency room with acute onset of productive cough over the last few days with associated chest congestion and fever as well as chills.  She had COVID about a month ago.  She was noted to be mildly hypoxic with pulse currently 90% on room air with EMS.  This improved to 93% on 4 L of O2 by nasal cannula.  She is awaiting initiation of hemodialysis.  No chest pain or palpitations.  No nausea or vomiting or abdominal pain.  No dysuria, oliguria or hematuria or flank pain.  No bleeding diathesis.  ED Course: When the patient came to the ER, BP was 166/61 with respiratory rate of 25, pulse oximetry of 88% on room air and 94% on 3 L of O2 by nasal cannula then 92 to 95% on 2L,  and heart rate of 87 and later 91.  Labs revealed mild hypokalemia of 3.2 and CO2 21 with blood glucose 135, BUN of 63 and creatinine 2.3 with calcium of 11.1.  High-sensitivity troponin was 87 and later 102.  Lactic acid was 1.3 and 0.4.  CBC showed anemia with hemoglobin 8.9 hematocrit 27.6 close to baseline and platelets of 145 compared to 193 last month. EKG as reviewed by me : EKG showed sinus rhythm with a rate of 88 with probable LVH and poor R wave progression. Imaging: 2 view chest x-ray showed enlarged cardiopericardial silhouette with trace edema and small pleural effusions, left greater than  right.  The patient was given IV Rocephin and Zithromax, 325 mg p.o. aspirin, 40 mg of IV Lasix, 2 DuoNebs and 125 mg of IV Solu-Medrol.  She will be admitted to a cardiac telemetry bed for further evaluation and management. PAST MEDICAL HISTORY:   Past Medical History:  Diagnosis Date   Actinic keratosis    Albuminuria    Anemia    Arthritis    Asthma    Basal cell carcinoma 04/12/2017   Above right lateral brow. Nodulocystic type. EDC   Cancer (HCC)    skin   Cataract cortical, senile    CHF (congestive heart failure) (HCC)    Diabetes mellitus without complication (HCC)    GERD (gastroesophageal reflux disease)    Hemorrhoids    History of kidney stones    Hyperlipidemia    Hypertension    Hypothyroidism    Lyme disease    No kidney function    OSA (obstructive sleep apnea)    Osteoporosis    Osteoporosis    Reflux esophagitis    Steatohepatitis    Steatohepatitis     PAST SURGICAL HISTORY:   Past Surgical History:  Procedure Laterality Date   ABDOMINAL HYSTERECTOMY     APPENDECTOMY     AV FISTULA PLACEMENT Left 05/19/2022   Procedure: ARTERIOVENOUS (AV) FISTULA CREATION ( BRACHIAL CEPHALIC );  Surgeon: Renford Dills, MD;  Location: [facility]  ORS;  Service: Vascular;  Laterality: Left;   CARDIAC CATHETERIZATION  1980   [facility]   CARDIAC CATHETERIZATION  08/13/2014   [facility]. no significant CAD, normal LVEDP.    CATARACT EXTRACTION     CHOLECYSTECTOMY     COLONOSCOPY     COLONOSCOPY WITH PROPOFOL N/A 12/07/2016   Procedure: COLONOSCOPY WITH PROPOFOL;  Surgeon: Christena Deem, MD;  Location: [facility] ENDOSCOPY;  Service: Endoscopy;  Laterality: N/A;   ESOPHAGOGASTRODUODENOSCOPY     ESOPHAGOGASTRODUODENOSCOPY (EGD) WITH PROPOFOL N/A 12/07/2016   Procedure: ESOPHAGOGASTRODUODENOSCOPY (EGD) WITH PROPOFOL;  Surgeon: Christena Deem, MD;  Location: [facility] ENDOSCOPY;  Service: Endoscopy;  Laterality: N/A;   ESOPHAGOGASTRODUODENOSCOPY (EGD) WITH PROPOFOL N/A 01/07/2018    Procedure: ESOPHAGOGASTRODUODENOSCOPY (EGD) WITH PROPOFOL;  Surgeon: Christena Deem, MD;  Location: [facility] ENDOSCOPY;  Service: Endoscopy;  Laterality: N/A;   EYE SURGERY     HEMATOMA EVACUATION Right 08/02/2023   Procedure: EVACUATION HEMATOMA;  Surgeon: Leafy Ro, MD;  Location: [facility] ORS;  Service: General;  Laterality: Right;   HEMORRHOID SURGERY     PARTIAL HYSTERECTOMY     THYROIDECTOMY N/A 08/25/2019   Procedure: THYROIDECTOMY EXTRACTION OF SUBTOTAL COMPONENT; PARATHYROID AUTOTRANSPLANT X1;  Surgeon: Duanne Guess, MD;  Location: [facility] ORS;  Service: General;  Laterality: N/A;  With Nerve Monitoring(RLN)   TONSILLECTOMY      SOCIAL HISTORY:   Social History   Tobacco Use   Smoking status: Never   Smokeless tobacco: Never  Substance Use Topics   Alcohol use: No    FAMILY HISTORY:   Family History  Problem Relation Age of Onset   Breast cancer Mother    Heart attack Father     DRUG ALLERGIES:   Allergies  Allergen Reactions   Ace Inhibitors     Other reaction(s): Unknown   Egg-Derived Products Diarrhea   Other     Other reaction(s): Other (See Comments) Eggs   Prednisone     Other reaction(s): Other (See Comments) joint pain   Risedronate     Other reaction(s): Other (See Comments)   Sulfa Antibiotics Itching and Swelling    Other reaction(s): Other (See Comments)   Sulfasalazine Other (See Comments)    REVIEW OF SYSTEMS:   ROS As per history of present illness. All pertinent systems were reviewed above. Constitutional, HEENT, cardiovascular, respiratory, GI, GU, musculoskeletal, neuro, psychiatric, endocrine, integumentary and hematologic systems were reviewed and are otherwise negative/unremarkable except for positive findings mentioned above in the HPI.   MEDICATIONS AT HOME:   Prior to Admission medications   Medication Sig Start Date End Date Taking? Authorizing Provider  acetaminophen (TYLENOL) 500 MG tablet Take 1,000 mg by mouth every 6  (six) hours as needed for mild pain or moderate pain.    [provider]  albuterol (VENTOLIN HFA) 108 (90 Base) MCG/ACT inhaler Inhale 2 puffs into the lungs every 6 (six) hours as needed for wheezing or shortness of breath. 11/14/23   Erin Fulling, MD  allopurinol (ZYLOPRIM) 100 MG tablet Take 100 mg by mouth daily. 09/17/23   [provider]  amLODipine (NORVASC) 5 MG tablet Take 1 tablet (5 mg total) by mouth daily. 08/06/23 08/05/24  Sunnie Nielsen, DO  apixaban (ELIQUIS) 2.5 MG TABS tablet Take 1 tablet (2.5 mg total) by mouth 2 (two) times daily. 06/04/23 06/03/24  Gillis Santa, MD  azelastine (ASTELIN) 0.1 % nasal spray Place 2 sprays into both nostrils 2 (two) times daily. Use in each nostril as directed 10/02/23 12/31/23  Read Drivers, MD  benzonatate (TESSALON) 100 MG capsule Take 2 capsules (200 mg total) by mouth every 8 (eight) hours. 10/27/23   Becky Augusta, NP  cefpodoxime (VANTIN) 100 MG tablet Take 1 tablet (100 mg total) by mouth 2 (two) times daily. 09/27/23   Michaelyn Barter, MD  esomeprazole (NEXIUM) 40 MG capsule Take 1 capsule (40 mg total) by mouth daily before breakfast. 11/27/23   Michaelyn Barter, MD  fluticasone (FLONASE) 50 MCG/ACT nasal spray Place 2 sprays into both nostrils daily. 10/02/23 12/31/23  Read Drivers, MD  fluticasone furoate-vilanterol (BREO ELLIPTA) 200-25 MCG/ACT AEPB Inhale 1 puff into the lungs daily. 11/14/23   Erin Fulling, MD  fluticasone-salmeterol (ADVAIR) 500-50 MCG/ACT AEPB Inhale 1 puff into the lungs in the morning and at bedtime. 11/14/23   Erin Fulling, MD  furosemide (LASIX) 40 MG tablet Take 1 tablet (40 mg total) by mouth 2 (two) times daily. 10/05/23 01/03/24  Charlsie Quest, NP  hydrALAZINE (APRESOLINE) 100 MG tablet Take 1 tablet (100 mg total) by mouth 3 (three) times daily. 04/22/23   Wouk, Wilfred Curtis, MD  ipratropium (ATROVENT) 0.06 % nasal spray Place 2 sprays into both nostrils 4 (four) times daily. 10/27/23   Becky Augusta, NP  ipratropium-albuterol (DUONEB) 0.5-2.5 (3) MG/3ML SOLN Take 3 mLs by nebulization every 6 (six) hours as needed (SOB). 11/14/23   Erin Fulling, MD  levothyroxine (SYNTHROID) 150 MCG tablet Take 150 mcg by mouth daily.    [provider]  loperamide (IMODIUM) 2 MG capsule Take 1 capsule (2 mg total) by mouth as needed for diarrhea or loose stools. 08/05/22   Arnetha Courser, MD  metoprolol tartrate (LOPRESSOR) 50 MG tablet Take 25 mg by mouth 2 (two) times daily.    [provider]  montelukast (SINGULAIR) 10 MG tablet Take 10 mg by mouth at bedtime.    [provider]  Multiple Minerals-Vitamins (CALCIUM & VIT D3 BONE HEALTH PO) Take 1 tablet by mouth daily. 600 mg/ 25 mg    [provider]  potassium chloride (KLOR-CON) 10 MEQ tablet Take 10 mEq by mouth daily. 05/30/23 05/29/24  [provider]  promethazine-dextromethorphan (PROMETHAZINE-DM) 6.25-15 MG/5ML syrup Take 5 mLs by mouth 4 (four) times daily as needed. 10/27/23   Becky Augusta, NP  rosuvastatin (CRESTOR) 40 MG tablet Take 40 mg by mouth daily.    [provider]  senna-docusate (SENOKOT-S) 8.6-50 MG tablet Take 2 tablets by mouth at bedtime as needed for mild constipation. 08/06/23   Sunnie Nielsen, DO  sertraline (ZOLOFT) 100 MG tablet Take 100 mg by mouth daily.    [provider]  vitamin B-12 (CYANOCOBALAMIN) 500 MCG tablet Take 500 mcg by mouth daily.    [provider]      VITAL SIGNS:  Blood pressure (!) 135/52, pulse 87, temperature 98.2 F (36.8 C), temperature source Oral, resp. rate (!) 26, height 5\' 2"  (1.575 m), weight 85.9 kg, SpO2 92%.  PHYSICAL EXAMINATION:  Physical Exam  GENERAL:  83 y.o.-year-old patient lying in the bed with no acute distress.  EYES: Pupils equal, round, reactive to light and accommodation. No scleral icterus. Extraocular muscles intact.  HEENT: Head atraumatic, normocephalic. Oropharynx and nasopharynx clear.   NECK:  Supple, no jugular venous distention. No thyroid enlargement, no tenderness.  LUNGS: Diminished bibasilar breath sounds with bibasal crackles.  Diffuse expiratory wheezes with diminished expiratory airflow and harsh vesicular breathing.  No use of accessory muscles of respiration.  CARDIOVASCULAR: Regular rate  and rhythm, S1, S2 normal. No murmurs, rubs, or gallops.  ABDOMEN: Soft, nondistended, nontender. Bowel sounds present. No organomegaly or mass.  EXTREMITIES: No pedal edema, cyanosis, or clubbing.  NEUROLOGIC: Cranial nerves II through XII are intact. Muscle strength 5/5 in all extremities. Sensation intact. Gait not checked.  PSYCHIATRIC: The patient is alert and oriented x 3.  Normal affect and good eye contact. SKIN: No obvious rash, lesion, or ulcer.   LABORATORY PANEL:   CBC Recent Labs  Lab 12/21/23 2017  WBC 6.3  HGB 8.9*  HCT 27.3*  PLT 129*   ------------------------------------------------------------------------------------------------------------------  Chemistries  Recent Labs  Lab 12/21/23 1436 12/21/23 2017  NA 136  --   K 3.2*  --   CL 105  --   CO2 21*  --   GLUCOSE 135*  --   BUN 63*  --   CREATININE 2.30* 2.26*  CALCIUM 11.1*  --    ------------------------------------------------------------------------------------------------------------------  Cardiac Enzymes No results for input(s): "TROPONINI" in the last 168 hours. ------------------------------------------------------------------------------------------------------------------  RADIOLOGY:  DG Chest 2 View Result Date: 12/21/2023 CLINICAL DATA:  Shortness of breath.  Cough since March 14th. EXAM: CHEST - 2 VIEW COMPARISON:  X-ray 10/27/2023 and older FINDINGS: Enlarged cardiopericardial silhouette with vascular congestion. Some interstitial edema suggested. Small bilateral pleural effusions are seen, left greater than right. No pneumothorax. Film is under penetrated. Overlapping  cardiac leads. IMPRESSION: Enlarged cardiopericardial silhouette with vascular shin and trace edema. Small pleural effusions, left greater than right. Electronically Signed   By: Karen Kays M.D.   On: 12/21/2023 17:19      IMPRESSION AND PLAN:  Assessment and Plan: * CAP (community acquired pneumonia) - The patient was admitted to a cardiac telemetry bed. - Will continue antibiotic therapy with IV Rocephin and Zithromax. - Mucolytic therapy be provided as well as duo nebs q.i.d. and q.4 hours p.r.n. - We will follow blood cultures.   COPD with acute exacerbation (HCC) - We will place the patient IV steroid therapy with IV Solu-Medrol as well as nebulized bronchodilator therapy with duonebs q.i.d. and q.4 hours p.r.n.Marland Kitchen - Mucolytic therapy will be provided with Mucinex and antibiotic therapy with IV Rocephin and Zithromax. - O2 protocol will be followed.   Acute respiratory failure with hypoxia (HCC) - This is clearly secondary to #1 and #2. - Management as above. - O2 protocol will be followed.   Hypokalemia - We will replace potassium and follow level.  Essential hypertension - We will continue antihypertensive therapy.  Depression - We will continue Zoloft.  GERD without esophagitis - We will continue PPI therapy.  Paroxysmal atrial fibrillation (HCC) - We will continue Eliquis and Lopressor.  Dyslipidemia - We will continue statin therapy.  Hypothyroidism - We will continue Synthroid.    DVT prophylaxis:.  Eliquis. Advanced Care Planning:  Code Status: The patient is DNR and DNI.  This was discussed with her. Family Communication:  The plan of care was discussed in details with the patient (and family). I answered all questions. The patient agreed to proceed with the above mentioned plan. Further management will depend upon hospital course. Disposition Plan: Back to previous home environment Consults called: none. All the records are reviewed and case  discussed with ED provider.  Status is: Inpatient  At the time of the admission, it appears that the appropriate admission status for this patient is inpatient.  This is judged to be reasonable and necessary in order to provide the required intensity of service to ensure  the patient's safety given the presenting symptoms, physical exam findings and initial radiographic and laboratory data in the context of comorbid conditions.  The patient requires inpatient status due to high intensity of service, high risk of further deterioration and high frequency of surveillance required.  I certify that at the time of admission, it is my clinical judgment that the patient will require inpatient hospital care extending more than 2 midnights.                            Dispo: The patient is from: Home              Anticipated d/c is to: Home              Patient currently is not medically stable to d/c.              Difficult to place patient: No  Hannah Beat M.D on 12/21/2023 at 9:07 PM  [facility]   From 7 PM-7 AM, contact night-coverage [email or web address]  CC: Primary care physician; Patient, No Pcp Per

## 2023-12-21 NOTE — ED Provider Notes (Signed)
 [facility] Provider Note    Event Date/Time   First MD Initiated Contact with Patient 12/21/23 1507     (approximate)   History   Shortness of Breath   HPI  [name] is a 83 year old female with history of T2DM, COPD, OSA, CKD, HTN, anemia presenting to the emergency department for evaluation of shortness of breath.  For the past few days, patient has had a productive cough with associated fevers, congestion.  Reports that she had COVID about a month ago.  She took Tylenol around 1230.  Patient was found to be 90% on room air with EMS, improved to 93% on 4 L.  Is awaiting initiation of dialysis.    Physical Exam   Triage Vital Signs: ED Triage Vitals  Encounter Vitals Group     BP 12/21/23 1433 (!) 166/61     Systolic BP Percentile --      Diastolic BP Percentile --      Pulse Rate 12/21/23 1433 87     Resp 12/21/23 1434 (!) 25     Temp 12/21/23 1434 98.9 F (37.2 C)     Temp Source 12/21/23 1434 Oral     SpO2 12/21/23 1433 (!) 88 %     Weight --      Height --      Head Circumference --      Peak Flow --      Pain Score 12/21/23 1434 0     Pain Loc --      Pain Education --      Exclude from Growth Chart --     Most recent vital signs: Vitals:   12/21/23 1434 12/21/23 1854  BP:    Pulse:    Resp: (!) 25   Temp: 98.9 F (37.2 C) 98.2 F (36.8 C)  SpO2:       General: Awake, interactive  CV:  Regular rate, good peripheral perfusion.  Resp:  Tachypneic with mildly labored respirations, diminished breath sounds with occasional wheezing Abd:  Nondistended, soft, nontender to palpation Neuro:  Symmetric facial movement, fluid speech   ED Results / Procedures / Treatments   Labs (all labs ordered are listed, but only abnormal results are displayed) Labs Reviewed  BASIC METABOLIC PANEL - Abnormal; Notable for the following components:      Result Value   Potassium 3.2 (*)    CO2 21 (*)    Glucose, Bld 135 (*)    BUN 63  (*)    Creatinine, Ser 2.30 (*)    Calcium 11.1 (*)    GFR, Estimated 21 (*)    All other components within normal limits  CBC - Abnormal; Notable for the following components:   RBC 3.16 (*)    Hemoglobin 8.9 (*)    HCT 27.6 (*)    RDW 16.7 (*)    Platelets 145 (*)    All other components within normal limits  TROPONIN I (HIGH SENSITIVITY) - Abnormal; Notable for the following components:   Troponin I (High Sensitivity) 87 (*)    All other components within normal limits  TROPONIN I (HIGH SENSITIVITY) - Abnormal; Notable for the following components:   Troponin I (High Sensitivity) 102 (*)    All other components within normal limits  RESP PANEL BY RT-PCR (RSV, FLU A&B, COVID)  RVPGX2  CULTURE, BLOOD (ROUTINE X 2)  CULTURE, BLOOD (ROUTINE X 2)  LACTIC ACID, PLASMA  LACTIC ACID, PLASMA     EKG  EKG independently reviewed interpreted by myself (ER attending) demonstrates:  EKG demonstrate sinus rhythm rate of 88, PR 179, QRS 97, QTc 484  RADIOLOGY Imaging independently reviewed and interpreted by myself demonstrates:  CXR with vascular congestion and pleural effusions, no obvious focal consolidation  PROCEDURES:  Critical Care performed: Yes, see critical care procedure note(s)  CRITICAL CARE Performed by: Trinna Post   Total critical care time: 31 minutes  Critical care time was exclusive of separately billable procedures and treating other patients.  Critical care was necessary to treat or prevent imminent or life-threatening deterioration.  Critical care was time spent personally by me on the following activities: development of treatment plan with patient and/or surrogate as well as nursing, discussions with consultants, evaluation of patient's response to treatment, examination of patient, obtaining history from patient or surrogate, ordering and performing treatments and interventions, ordering and review of laboratory studies, ordering and review of radiographic  studies, pulse oximetry and re-evaluation of patient's condition.   Procedures   MEDICATIONS ORDERED IN ED: Medications  cefTRIAXone (ROCEPHIN) 2 g in sodium chloride 0.9 % 100 mL IVPB (2 g Intravenous New Bag/Given 12/21/23 1849)  azithromycin (ZITHROMAX) 500 mg in sodium chloride 0.9 % 250 mL IVPB (has no administration in time range)  furosemide (LASIX) injection 40 mg (has no administration in time range)  ipratropium-albuterol (DUONEB) 0.5-2.5 (3) MG/3ML nebulizer solution 3 mL (has no administration in time range)  ipratropium-albuterol (DUONEB) 0.5-2.5 (3) MG/3ML nebulizer solution 6 mL (6 mLs Nebulization Given 12/21/23 1538)  methylPREDNISolone sodium succinate (SOLU-MEDROL) 125 mg/2 mL injection 125 mg (125 mg Intravenous Given 12/21/23 1537)  aspirin tablet 325 mg (325 mg Oral Given 12/21/23 1849)     IMPRESSION / MDM / ASSESSMENT AND PLAN / ED COURSE  I reviewed the triage vital signs and the nursing notes.  Differential diagnosis includes, but is not limited to, pneumonia, viral illness, COPD exacerbation, fluid buildup in the setting of CKD, new onset CHF  Patient's presentation is most consistent with acute presentation with potential threat to life or bodily function.  83 year old female presenting with shortness of breath.  Tachypneic and hypoxic on presentation with wheezing.  Labs with stable to improved CKD, stable anemia.  Troponin is elevated at 87, increased to 102 on repeat.  Patient was treated for COPD exacerbation with steroids and nebs.  With her troponin elevation she was given aspirin, but I suspect that this is demand related in the setting of her respiratory complaints.  Her x-Talaya Lamprecht was interpreted by radiology as possible vascular congestion, though I do think multifocal pneumonia is a consideration based on her clinical history of fever and cough.  Ordered for antibiotics with Rocephin and azithromycin, but does not meet sepsis criteria.  Will trial Lasix for  possible volume overload.  Remains on 3 L O2 to maintain sats in the mid 90s here with continued increased work of breathing.  In the setting this, do think she is appropriate for admission.  Will reach out to hospitalist team.      FINAL CLINICAL IMPRESSION(S) / ED DIAGNOSES   Final diagnoses:  COPD exacerbation (HCC)  Hypoxia     Rx / DC Orders   ED Discharge Orders     None        Note:  This document was prepared using Dragon voice recognition software and may include unintentional dictation errors.   Trinna Post, MD 12/21/23 [phone]

## 2023-12-21 NOTE — Assessment & Plan Note (Signed)
 -  We will continue Synthroid.

## 2023-12-21 NOTE — Assessment & Plan Note (Signed)
-   We will continue Zoloft 

## 2023-12-21 NOTE — Assessment & Plan Note (Signed)
 -  We will continue PPI therapy

## 2023-12-21 NOTE — Assessment & Plan Note (Signed)
-   The patient was admitted to a cardiac telemetry bed. - Will continue antibiotic therapy with IV Rocephin and Zithromax. - Mucolytic therapy be provided as well as duo nebs q.i.d. and q.4 hours p.r.n. - We will follow blood cultures.

## 2023-12-21 NOTE — ED Notes (Signed)
 Attempted twice for blood cultures and was only able to get one aerobic culture. Lab called to send phlebotomist for remaining bloodwork

## 2023-12-21 NOTE — ED Notes (Signed)
 Patient transported to X-ray

## 2023-12-21 NOTE — Assessment & Plan Note (Signed)
-  We will continue Eliquis and Lopressor.

## 2023-12-21 NOTE — Assessment & Plan Note (Signed)
-   We will continue antihypertensive therapy.

## 2023-12-22 DIAGNOSIS — J189 Pneumonia, unspecified organism: Secondary | ICD-10-CM | POA: Diagnosis not present

## 2023-12-22 LAB — CBC
HCT: 25.1 % — ABNORMAL LOW (ref 36.0–46.0)
Hemoglobin: 8 g/dL — ABNORMAL LOW (ref 12.0–15.0)
MCH: 28.2 pg (ref 26.0–34.0)
MCHC: 31.9 g/dL (ref 30.0–36.0)
MCV: 88.4 fL (ref 80.0–100.0)
Platelets: 138 10*3/uL — ABNORMAL LOW (ref 150–400)
RBC: 2.84 MIL/uL — ABNORMAL LOW (ref 3.87–5.11)
RDW: 17 % — ABNORMAL HIGH (ref 11.5–15.5)
WBC: 5 10*3/uL (ref 4.0–10.5)
nRBC: 0 % (ref 0.0–0.2)

## 2023-12-22 LAB — BASIC METABOLIC PANEL
Anion gap: 8 (ref 5–15)
BUN: 68 mg/dL — ABNORMAL HIGH (ref 8–23)
CO2: 19 mmol/L — ABNORMAL LOW (ref 22–32)
Calcium: 10.6 mg/dL — ABNORMAL HIGH (ref 8.9–10.3)
Chloride: 109 mmol/L (ref 98–111)
Creatinine, Ser: 2.38 mg/dL — ABNORMAL HIGH (ref 0.44–1.00)
GFR, Estimated: 20 mL/min — ABNORMAL LOW (ref >60)
Glucose, Bld: 182 mg/dL — ABNORMAL HIGH (ref 70–99)
Potassium: 3.4 mmol/L — ABNORMAL LOW (ref 3.5–5.1)
Sodium: 136 mmol/L (ref 135–145)

## 2023-12-22 LAB — RESPIRATORY PANEL BY PCR

## 2023-12-22 LAB — CBC WITH DIFFERENTIAL/PLATELET
Abs Immature Granulocytes: 0.02 10*3/uL (ref 0.00–0.07)
Basophils Absolute: 0 10*3/uL (ref 0.0–0.1)
Basophils Relative: 0 %
Eosinophils Absolute: 0.1 10*3/uL (ref 0.0–0.5)
Eosinophils Relative: 1 %
HCT: 25.2 % — ABNORMAL LOW (ref 36.0–46.0)
Hemoglobin: 8 g/dL — ABNORMAL LOW (ref 12.0–15.0)
Immature Granulocytes: 0 %
Lymphocytes Relative: 11 %
Lymphs Abs: 0.6 10*3/uL — ABNORMAL LOW (ref 0.7–4.0)
MCH: 28.2 pg (ref 26.0–34.0)
MCHC: 31.7 g/dL (ref 30.0–36.0)
MCV: 88.7 fL (ref 80.0–100.0)
Monocytes Absolute: 0.2 10*3/uL (ref 0.1–1.0)
Monocytes Relative: 5 %
Neutro Abs: 4 10*3/uL (ref 1.7–7.7)
Neutrophils Relative %: 83 %
Platelets: 134 10*3/uL — ABNORMAL LOW (ref 150–400)
RBC: 2.84 MIL/uL — ABNORMAL LOW (ref 3.87–5.11)
RDW: 17.2 % — ABNORMAL HIGH (ref 11.5–15.5)
WBC: 4.9 10*3/uL (ref 4.0–10.5)
nRBC: 0 % (ref 0.0–0.2)

## 2023-12-22 LAB — PROCALCITONIN: Procalcitonin: 0.35 ng/mL

## 2023-12-22 LAB — BRAIN NATRIURETIC PEPTIDE: B Natriuretic Peptide: 1766.6 pg/mL — ABNORMAL HIGH (ref 0.0–100.0)

## 2023-12-22 MED ORDER — FLUTICASONE FUROATE-VILANTEROL 100-25 MCG/ACT IN AEPB
1.0000 | INHALATION_SPRAY | Freq: Every day | RESPIRATORY_TRACT | Status: DC
  Administered 2023-12-22 – 2024-01-01 (×11): 1 via RESPIRATORY_TRACT
  Filled 2023-12-22 (×2): qty 28

## 2023-12-22 MED ORDER — PREDNISONE 20 MG PO TABS
40.0000 mg | ORAL_TABLET | Freq: Every day | ORAL | Status: AC
  Administered 2023-12-23 – 2023-12-26 (×4): 40 mg via ORAL
  Filled 2023-12-22 (×5): qty 2

## 2023-12-22 MED ORDER — LEVOTHYROXINE SODIUM 50 MCG PO TABS
150.0000 ug | ORAL_TABLET | Freq: Every day | ORAL | Status: DC
  Administered 2023-12-23 – 2024-01-01 (×10): 150 ug via ORAL
  Filled 2023-12-22 (×10): qty 1

## 2023-12-22 MED ORDER — AZITHROMYCIN 250 MG PO TABS
500.0000 mg | ORAL_TABLET | Freq: Every day | ORAL | Status: AC
  Administered 2023-12-22 – 2023-12-23 (×2): 500 mg via ORAL
  Filled 2023-12-22 (×2): qty 2

## 2023-12-22 NOTE — ED Notes (Signed)
 Pt linens soiled. Complete bed change complete.

## 2023-12-22 NOTE — ED Notes (Signed)
 Pt assisted with plugging in cipap. RT consulted to help pt with cipap and oxygen set up. Pt still on 4L [place].

## 2023-12-22 NOTE — Progress Notes (Signed)
 Progress Note   Patient: Mollyann Halbert Porcher WUJ:[id number] DOB: 21-Apr-1941 DOA: 12/21/2023     1 DOS: the patient was seen and examined on 12/22/2023   Brief hospital course: Rhapsody Wolven Halleck is a 83 y.o. female with medical history significant for asthma, osteoarthritis CHF, type 2 diabetes mellitus, GERD, hypertension, dyslipidemia, hypothyroidism, OSA, stage IV chronic kidney disease, and hypothyroidism who presented to the emergency room with productive cough over the last few days with associated chest congestion, fever, as well as chills.  She had COVID about a month ago.  She was noted to be mildly hypoxic with pulse oximeter reading 90% on room air with EMS.  This improved to 93% on 4 L of O2 by nasal cannula.  She is currently awaiting initiation of hemodialysis, but makes urine.  No chest pain or palpitations.  No nausea or vomiting or abdominal pain.  No dysuria, oliguria or hematuria or flank pain.  No bleeding diathesis.      Sometimes feels like food gets stuck in throat, liquids as well.   This started about 6 weeks ago. Has to swallow mulitple times and chew food up fine.   Take one tablet of lasix in the morning.   Assessment and Plan: * CAP (community acquired pneumonia) Patient presented with productive cough and fever. She had minimal oxygen requirement and was afebrile at the hospital. She had no leukocytosis with elevated procalcitonin. CXR with no focal consolidation but sm pleural effusions and vascular congestion. She was started on CAP coverage with ceftriaxone and azithromycin.   Given noted fevers at home and elevated procalcitonin will continue antibiotics.   COPD exacerbation (HCC) Patient with recent PFTs, with FEV1/FVC ratio normal, FEV1 and FVC normal. She had no significant bronchodilator response. She did however note improvement in her symptoms with broncho dilators. Her pulmonologist felt she could have mild obstructive airway disease.  -Continue breo inhaler  while inpatient + PRN nebs  -Mucinex DM for cough  -Continue steroids with prednisone 40mg  for four more days   Acute respiratory failure with hypoxia (HCC) Likely secondary to above. Possible component of exacerbation of HFpEF as well (BNP elevated and noted to have interstitial edema/vascular congestion on admission CXR) Wean O2 as able  HFpEF exacerbation  Clinical and laboratory evidence of volume overload. Continue antihypertensives. Continue lasix 40mg  BID.  Not a candidate for SGLT2 inh given renal function  Strict I/Os Daily weights   CKD stage 4 Non oliguric     Latest Ref Rng & Units 12/22/2023    4:03 AM 12/21/2023    8:17 PM 12/21/2023    2:36 PM  BMP  Glucose 70 - 99 mg/dL 956   213   BUN 8 - 23 mg/dL 68   63   Creatinine 0.86 - 1.00 mg/dL 5.78  4.69  6.29   Sodium 135 - 145 mmol/L 136   136   Potassium 3.5 - 5.1 mmol/L 3.4   3.2   Chloride 98 - 111 mmol/L 109   105   CO2 22 - 32 mmol/L 19   21   Calcium 8.9 - 10.3 mg/dL 52.8   41.3    Creatinine is stable/mildly improved. Continue with lasix. She will be starting HD soon.   Normocytic anemia  Thrombocytopenia -mild     Latest Ref Rng & Units 12/22/2023    4:03 AM 12/21/2023    8:17 PM 12/21/2023    2:36 PM  CBC  WBC 4.0 - 10.5 K/uL 4.0 - 10.5  K/uL 5.0    4.9  6.3  6.9   Hemoglobin 12.0 - 15.0 g/dL 16.1 - 09.6 g/dL 8.0    8.0  8.9  8.9   Hematocrit 36.0 - 46.0 % 36.0 - 46.0 % 25.1    25.2  27.3  27.6   Platelets 150 - 400 K/uL 150 - 400 K/uL 138    134  129  145    No obvious evidence of bleeding.  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  Also mild thrombocytopenia. -Check vitamin B12, folate, iron panel  Hypokalemia - Replete   Essential hypertension Continue amlodipine and hydralazine.  Depression Continue home Zoloft.  GERD without esophagitis Continue home PPI therapy.  Paroxysmal atrial fibrillation (HCC) In sinus rhythm.  Continue metoprolol tartrate and  apixaban  Dyslipidemia Continue statin therapy  Hypothyroidism Continue home Synthroid.        Subjective: Feels shortness of breath and cough are minimally improved.   Physical Exam: Vitals:   12/22/23 1130 12/22/23 1200 12/22/23 1330 12/22/23 1400  BP: (!) 162/66 (!) 182/69 (!) 125/96 (!) 152/74  Pulse: 74 70 75 72  Resp: (!) 33 (!) 23 18 (!) 21  Temp: 98 F (36.7 C)     TempSrc:      SpO2: 95% 98% 93% 94%  Weight:      Height:       Constitutional: In no distress.  Cardiovascular: Normal rate, regular rhythm. No lower extremity edema. LUE AV fistula Pulmonary: Non labored breathing on [place], no wheezing, faint rales at the bases.  Abdominal: Soft. Normal bowel sounds. Non distended and non tender Musculoskeletal: Normal range of motion.     Neurological: Alert and oriented to person, place, and time. Non focal  Skin: Skin is warm and dry.   Data Reviewed:     Latest Ref Rng & Units 12/22/2023    4:03 AM 12/21/2023    8:17 PM 12/21/2023    2:36 PM  CBC  WBC 4.0 - 10.5 K/uL 4.0 - 10.5 K/uL 5.0    4.9  6.3  6.9   Hemoglobin 12.0 - 15.0 g/dL 04.5 - 40.9 g/dL 8.0    8.0  8.9  8.9   Hematocrit 36.0 - 46.0 % 36.0 - 46.0 % 25.1    25.2  27.3  27.6   Platelets 150 - 400 K/uL 150 - 400 K/uL 138    134  129  145       Latest Ref Rng & Units 12/22/2023    4:03 AM 12/21/2023    8:17 PM 12/21/2023    2:36 PM  BMP  Glucose 70 - 99 mg/dL 811   914   BUN 8 - 23 mg/dL 68   63   Creatinine 7.82 - 1.00 mg/dL 9.56  2.13  0.86   Sodium 135 - 145 mmol/L 136   136   Potassium 3.5 - 5.1 mmol/L 3.4   3.2   Chloride 98 - 111 mmol/L 109   105   CO2 22 - 32 mmol/L 19   21   Calcium 8.9 - 10.3 mg/dL 57.8   46.9      Family Communication: Niece and Spouse at bedside   Disposition: Status is: Inpatient Remains inpatient appropriate because: management of dyspnea/increased O2 requirement   Planned Discharge Destination:  Pending clinical course     Time spent: 35  minutes  Author: Marolyn Haller, MD 12/22/2023 3:46 PM  For on call review [email or web address].

## 2023-12-22 NOTE — Evaluation (Signed)
 Clinical/Bedside Swallow Evaluation Patient Details  Name: [name] MRN: [id number] Date of Birth: [birth year]  Today's Date: 12/22/2023 Time: SLP Start Time (ACUTE ONLY): 1330 SLP Stop Time (ACUTE ONLY): 1400 SLP Time Calculation (min) (ACUTE ONLY): 30 min  Past Medical History:  Past Medical History:  Diagnosis Date   Actinic keratosis    Albuminuria    Anemia    Arthritis    Asthma    Basal cell carcinoma 04/12/2017   Above right lateral brow. Nodulocystic type. EDC   Cancer (HCC)    skin   Cataract cortical, senile    CHF (congestive heart failure) (HCC)    Diabetes mellitus without complication (HCC)    GERD (gastroesophageal reflux disease)    Hemorrhoids    History of kidney stones    Hyperlipidemia    Hypertension    Hypothyroidism    Lyme disease    No kidney function    OSA (obstructive sleep apnea)    Osteoporosis    Osteoporosis    Reflux esophagitis    Steatohepatitis    Steatohepatitis    Past Surgical History:  Past Surgical History:  Procedure Laterality Date   ABDOMINAL HYSTERECTOMY     APPENDECTOMY     AV FISTULA PLACEMENT Left 05/19/2022   Procedure: ARTERIOVENOUS (AV) FISTULA CREATION ( BRACHIAL CEPHALIC );  Surgeon: Renford Dills, MD;  Location: [facility] ORS;  Service: Vascular;  Laterality: Left;   CARDIAC CATHETERIZATION  1980   [facility]   CARDIAC CATHETERIZATION  08/13/2014   [facility]. no significant CAD, normal LVEDP.    CATARACT EXTRACTION     CHOLECYSTECTOMY     COLONOSCOPY     COLONOSCOPY WITH PROPOFOL N/A 12/07/2016   Procedure: COLONOSCOPY WITH PROPOFOL;  Surgeon: Christena Deem, MD;  Location: [facility] ENDOSCOPY;  Service: Endoscopy;  Laterality: N/A;   ESOPHAGOGASTRODUODENOSCOPY     ESOPHAGOGASTRODUODENOSCOPY (EGD) WITH PROPOFOL N/A 12/07/2016   Procedure: ESOPHAGOGASTRODUODENOSCOPY (EGD) WITH PROPOFOL;  Surgeon: Christena Deem, MD;  Location: [facility] ENDOSCOPY;  Service: Endoscopy;  Laterality: N/A;   ESOPHAGOGASTRODUODENOSCOPY (EGD)  WITH PROPOFOL N/A 01/07/2018   Procedure: ESOPHAGOGASTRODUODENOSCOPY (EGD) WITH PROPOFOL;  Surgeon: Christena Deem, MD;  Location: [facility] ENDOSCOPY;  Service: Endoscopy;  Laterality: N/A;   EYE SURGERY     HEMATOMA EVACUATION Right 08/02/2023   Procedure: EVACUATION HEMATOMA;  Surgeon: Leafy Ro, MD;  Location: [facility] ORS;  Service: General;  Laterality: Right;   HEMORRHOID SURGERY     PARTIAL HYSTERECTOMY     THYROIDECTOMY N/A 08/25/2019   Procedure: THYROIDECTOMY EXTRACTION OF SUBTOTAL COMPONENT; PARATHYROID AUTOTRANSPLANT X1;  Surgeon: Duanne Guess, MD;  Location: [facility] ORS;  Service: General;  Laterality: N/A;  With Nerve Monitoring(RLN)   TONSILLECTOMY     HPI:  [name] is a 83 y.o. female with medical history significant for asthma, osteoarthritis CHF, type 2 diabetes mellitus, GERD, hypertension, dyslipidemia, hypothyroidism, OSA, stage IV chronic kidney disease, and hypothyroidism who presented to the emergency room with acute onset of productive cough over the last few days with associated chest congestion and fever as well as chills.  She had COVID about a month ago.  She was noted to be mildly hypoxic with pulse currently 90% on room air with EMS.  This improved to 93% on 4 L of O2 by nasal cannula.  She is awaiting initiation of hemodialysis.  No chest pain or palpitations.  No nausea or vomiting or abdominal pain.  No dysuria, oliguria or hematuria or flank pain.  No  bleeding diathesis. Pt is currently on a regular solids and thin liquids diet. Nasal canula set to 3L O2. CXR 3/21: Enlarged cardiopericardial silhouette with vascular shin and trace edema. Small pleural effusions, left greater than right. Per ENT report 09/2023: Does have some dysphonia since thyroidectomy 2020; now with some worsening and Transnasal Fiberoptic Laryngoscopy shows right polypoid VF lesion.    Assessment / Plan / Recommendation  Clinical Impression  Pt seen for bedside swallow assessment in the  setting of concern for dysphagia (pt with choking episode with thin liquids this AM). Pt seen with trials of thin liquids (via straw) and regular solids. No overt or subtle s/sx pharyngeal dysphagia noted. No change to vocal quality across trials. O2 saturations maintained at greater than 95 for duration of session. Pt with productive cough, noted prior to PO trials, though not noted during PO intake. Respiratory rate spiked to mid 30's following sequential sips via straw (likely related to lengthened apnea following sequential swallows). No fluctuations in respiratory rate with cues for single sips. Oral phase grossly intact- with complete manipulation and clearance of regular solid from oral cavity. Education shared regarding monitoring endurance and breathing during PO intake (especially given current respiratory status) and compliance with esophageal precautions in the setting of baseline GERD. Pt reported understanding; pt also demonstrating insight for poor positioning leading to choking event this AM.   Pt's current respiratory status, deconditioning, and hx of GERD increases pt's risk for aspiration during and after swallow. Therefore, recommend precautions (slow rate, SINGLE sips, small bites, elevated HOB during and after intake, alert for PO intake). Given pt's insight and awareness of risk and provided education, no follow up acute SLP services indicated. MD and RN aware of SLP recommendations. SLP Visit Diagnosis: Dysphagia, unspecified (R13.10)    Aspiration Risk  Mild aspiration risk    Diet Recommendation   Age appropriate regular;Thin  Medication Administration: Whole meds with liquid    Other  Recommendations Recommended Consults: Consider GI evaluation (if GERD symptoms worsen) Oral Care Recommendations: Oral care BID    Recommendations for follow up therapy are one component of a multi-disciplinary discharge planning process, led by the attending physician.  Recommendations may be  updated based on patient status, additional functional criteria and insurance authorization.  Follow up Recommendations No SLP follow up      Assistance Recommended at Discharge  Intermittent supervision for compensatory strategies  Functional Status Assessment Patient has not had a recent decline in their functional status (in regards to oropharyngeal function)    Swallow Study   General Date of Onset: 12/22/23 HPI: [name] is a 83 y.o. female with medical history significant for asthma, osteoarthritis CHF, type 2 diabetes mellitus, GERD, hypertension, dyslipidemia, hypothyroidism, OSA, stage IV chronic kidney disease, and hypothyroidism who presented to the emergency room with acute onset of productive cough over the last few days with associated chest congestion and fever as well as chills.  She had COVID about a month ago.  She was noted to be mildly hypoxic with pulse currently 90% on room air with EMS.  This improved to 93% on 4 L of O2 by nasal cannula.  She is awaiting initiation of hemodialysis.  No chest pain or palpitations.  No nausea or vomiting or abdominal pain.  No dysuria, oliguria or hematuria or flank pain.  No bleeding diathesis. Pt is currently on a regular solids and thin liquids diet. Nasal canula set to 3L O2. CXR 3/21: Enlarged cardiopericardial silhouette with vascular  shin and trace edema. Small pleural effusions, left greater than right. Per ENT report 09/2023: Does have some dysphonia since thyroidectomy 2020; now with some worsening and Transnasal Fiberoptic Laryngoscopy shows right polypoid VF lesion. Type of Study: Bedside Swallow Evaluation Previous Swallow Assessment: none in chart Diet Prior to this Study: Regular;Thin liquids (Level 0) Temperature Spikes Noted: No (Temp 98.2; WBC 4.9) Respiratory Status: Nasal cannula (3L) History of Recent Intubation: No Behavior/Cognition: Cooperative;Alert;Pleasant mood Oral Cavity Assessment: Within Functional  Limits Oral Care Completed by SLP: Yes Oral Cavity - Dentition: Adequate natural dentition Vision: Functional for self-feeding Self-Feeding Abilities: Able to feed self Patient Positioning: Upright in bed Baseline Vocal Quality: Normal Volitional Cough: Strong;Congested (productive) Volitional Swallow: Able to elicit    Oral/Motor/Sensory Function Overall Oral Motor/Sensory Function: Within functional limits   Ice Chips Ice chips: Not tested   Thin Liquid Thin Liquid: Within functional limits Presentation: Straw    Nectar Thick Nectar Thick Liquid: Not tested   Honey Thick Honey Thick Liquid: Not tested   Puree Puree: Not tested   Solid     Solid: Within functional limits     [place] Brandyn Thien Clapp, MS, CCC-SLP Speech Language Pathologist Rehab Services; [facility] [place] [phone] (ascom)   [place] J Clapp 12/22/2023,3:09 PM

## 2023-12-23 DIAGNOSIS — E785 Hyperlipidemia, unspecified: Secondary | ICD-10-CM

## 2023-12-23 DIAGNOSIS — J441 Chronic obstructive pulmonary disease with (acute) exacerbation: Secondary | ICD-10-CM | POA: Diagnosis not present

## 2023-12-23 DIAGNOSIS — I480 Paroxysmal atrial fibrillation: Secondary | ICD-10-CM

## 2023-12-23 DIAGNOSIS — E039 Hypothyroidism, unspecified: Secondary | ICD-10-CM

## 2023-12-23 DIAGNOSIS — J189 Pneumonia, unspecified organism: Secondary | ICD-10-CM | POA: Diagnosis not present

## 2023-12-23 DIAGNOSIS — F32A Depression, unspecified: Secondary | ICD-10-CM

## 2023-12-23 DIAGNOSIS — K219 Gastro-esophageal reflux disease without esophagitis: Secondary | ICD-10-CM

## 2023-12-23 DIAGNOSIS — J9601 Acute respiratory failure with hypoxia: Secondary | ICD-10-CM | POA: Diagnosis not present

## 2023-12-23 DIAGNOSIS — E876 Hypokalemia: Secondary | ICD-10-CM | POA: Diagnosis not present

## 2023-12-23 LAB — CBC
HCT: 26.7 % — ABNORMAL LOW (ref 36.0–46.0)
Hemoglobin: 8.8 g/dL — ABNORMAL LOW (ref 12.0–15.0)
MCH: 28.6 pg (ref 26.0–34.0)
MCHC: 33 g/dL (ref 30.0–36.0)
MCV: 86.7 fL (ref 80.0–100.0)
Platelets: 200 10*3/uL (ref 150–400)
RBC: 3.08 MIL/uL — ABNORMAL LOW (ref 3.87–5.11)
RDW: 17.2 % — ABNORMAL HIGH (ref 11.5–15.5)
WBC: 9.8 10*3/uL (ref 4.0–10.5)
nRBC: 0 % (ref 0.0–0.2)

## 2023-12-23 LAB — FOLATE: Folate: 6.8 ng/mL (ref >5.9)

## 2023-12-23 LAB — FERRITIN: Ferritin: 99 ng/mL (ref 11–307)

## 2023-12-23 LAB — RENAL FUNCTION PANEL
Albumin: 3 g/dL — ABNORMAL LOW (ref 3.5–5.0)
Anion gap: 6 (ref 5–15)
BUN: 78 mg/dL — ABNORMAL HIGH (ref 8–23)
CO2: 23 mmol/L (ref 22–32)
Calcium: 10.1 mg/dL (ref 8.9–10.3)
Chloride: 110 mmol/L (ref 98–111)
Creatinine, Ser: 2.44 mg/dL — ABNORMAL HIGH (ref 0.44–1.00)
GFR, Estimated: 19 mL/min — ABNORMAL LOW (ref >60)
Glucose, Bld: 86 mg/dL (ref 70–99)
Phosphorus: 2.9 mg/dL (ref 2.5–4.6)
Potassium: 3.4 mmol/L — ABNORMAL LOW (ref 3.5–5.1)
Sodium: 139 mmol/L (ref 135–145)

## 2023-12-23 LAB — IRON AND TIBC
Iron: 32 ug/dL (ref 28–170)
Saturation Ratios: 11 % (ref 10.4–31.8)
TIBC: 302 ug/dL (ref 250–450)
UIBC: 270 ug/dL

## 2023-12-23 LAB — MRSA NEXT GEN BY PCR, NASAL: MRSA by PCR Next Gen: NOT DETECTED

## 2023-12-23 LAB — VITAMIN B12: Vitamin B-12: 622 pg/mL (ref 180–914)

## 2023-12-23 MED ORDER — FOLIC ACID 1 MG PO TABS
1.0000 mg | ORAL_TABLET | Freq: Every day | ORAL | Status: DC
  Administered 2023-12-23 – 2024-01-01 (×10): 1 mg via ORAL
  Filled 2023-12-23 (×10): qty 1

## 2023-12-23 MED ORDER — ALBUTEROL SULFATE (2.5 MG/3ML) 0.083% IN NEBU
2.5000 mg | INHALATION_SOLUTION | RESPIRATORY_TRACT | Status: DC | PRN
  Administered 2023-12-24 – 2023-12-25 (×3): 2.5 mg via RESPIRATORY_TRACT
  Filled 2023-12-23 (×3): qty 3

## 2023-12-23 MED ORDER — IPRATROPIUM-ALBUTEROL 0.5-2.5 (3) MG/3ML IN SOLN
3.0000 mL | RESPIRATORY_TRACT | Status: DC | PRN
  Administered 2023-12-23: 3 mL via RESPIRATORY_TRACT
  Filled 2023-12-23: qty 3

## 2023-12-23 MED ORDER — SALINE SPRAY 0.65 % NA SOLN
1.0000 | NASAL | Status: DC | PRN
  Administered 2023-12-23: 1 via NASAL
  Filled 2023-12-23: qty 44

## 2023-12-23 NOTE — Plan of Care (Signed)
  Problem: Respiratory: Goal: Ability to maintain adequate ventilation will improve Outcome: Progressing   Problem: Education: Goal: Knowledge of General Education information will improve Description: Including pain rating scale, medication(s)/side effects and non-pharmacologic comfort measures Outcome: Progressing   Problem: Clinical Measurements: Goal: Ability to maintain clinical measurements within normal limits will improve Outcome: Progressing   Problem: Activity: Goal: Risk for activity intolerance will decrease Outcome: Progressing   Problem: Safety: Goal: Ability to remain free from injury will improve Outcome: Progressing    Problem: Respiratory: Goal: Ability to maintain a clear airway will improve Outcome: Progressing

## 2023-12-23 NOTE — Evaluation (Signed)
 Physical Therapy Evaluation Patient Details Name: [name] MRN: [id number] DOB: 28-Apr-1941 Today's Date: 12/23/2023  History of Present Illness  83 y.o. female with medical history significant for asthma, osteoarthritis CHF, type 2 diabetes mellitus, GERD, hypertension, dyslipidemia, hypothyroidism, OSA, stage IV chronic kidney disease, and hypothyroidism who presented to the emergency room with productive cough over the last few days with associated chest congestion, fever, as well as chills.  She had COVID about a month ago.  She was noted to be mildly hypoxic with pulse oximeter reading 90% on room air with EMS.  Clinical Impression  Pt was mobile and confident with activity, able to ambulate ~125 ft with smooth and consistent cadence, no LOBs but relatively quick to fatigue with SpO2 dropping from mid 90s to 87% on 2L.  She did not have any LOBs or need direct assist with mobility but is not at her baseline and will benefit from continued PT to address functional limitations.        If plan is discharge home, recommend the following: Assist for transportation;Assistance with cooking/housework   Can travel by private vehicle        Equipment Recommendations None recommended by PT  Recommendations for Other Services       Functional Status Assessment Patient has had a recent decline in their functional status and demonstrates the ability to make significant improvements in function in a reasonable and predictable amount of time.     Precautions / Restrictions Precautions Precautions: Fall Restrictions Weight Bearing Restrictions Per Provider Order: No      Mobility  Bed Mobility               General bed mobility comments: in recliner pre/post session    Transfers Overall transfer level: Modified independent Equipment used: Rolling walker (2 wheels)               General transfer comment: easily able to rise w/o assist, does not require UE/AD to maintain  balance    Ambulation/Gait Ambulation/Gait assistance: Supervision Gait Distance (Feet): 125 Feet Assistive device: Rolling walker (2 wheels)         General Gait Details: Pt was able to assume safe and consistent cadence with light walker use.  On 2L t/o the effort with sats slowly dropping down to 87% (from low/mid 90s), recovering to 94% after ~2 minutes rest.  Pt with no LOBs or overt safety issues, limited by fatigue.  Stairs            Wheelchair Mobility     Tilt Bed    Modified Rankin (Stroke Patients Only)       Balance Overall balance assessment: Modified Independent                                           Pertinent Vitals/Pain Pain Assessment Pain Assessment: No/denies pain    Home Living Family/patient expects to be discharged to:: Private residence Living Arrangements: Spouse/significant other Available Help at Discharge: Family Type of Home: House Home Access: Stairs to enter Entrance Stairs-Rails: None Entrance Stairs-Number of Steps: 1 Alternate Level Stairs-Number of Steps: Flight Home Layout: Two level;Laundry or work area in basement;Able to live on main level with bedroom/bathroom Home Equipment: Agricultural consultant (2 wheels);BSC/3in1;Tub bench;Grab bars - tub/shower;Wheelchair - manual Additional Comments: does not access stairs in home regularly    Prior Function Prior Level of  Function : Independent/Modified Independent             Mobility Comments: amb with no AD (reports she will use cane or walker if she is feeling off.) ADLs Comments: MOD I feeding, grooming, bathing (with shower chair), dressing; pt cooks, neice helps w cleaning, med management; pt drives     Extremity/Trunk Assessment   Upper Extremity Assessment Upper Extremity Assessment: Overall WFL for tasks assessed (age appropriate)    Lower Extremity Assessment Lower Extremity Assessment: Overall WFL for tasks assessed (age appropriate)        Communication   Communication Communication: No apparent difficulties    Cognition Arousal: Alert Behavior During Therapy: WFL for tasks assessed/performed   PT - Cognitive impairments: No apparent impairments                         Following commands: Intact       Cueing Cueing Techniques: Verbal cues     General Comments General comments (skin integrity, edema, etc.): Pt pleasant and eager to see how well she can move, had expected fatigue with prolonged ambulation but ultimately safe and appropriate    Exercises     Assessment/Plan    PT Assessment Patient needs continued PT services  PT Problem List Decreased strength;Decreased activity tolerance;Cardiopulmonary status limiting activity       PT Treatment Interventions DME instruction;Gait training;Stair training;Functional mobility training;Therapeutic activities;Therapeutic exercise;Balance training;Patient/family education    PT Goals (Current goals can be found in the Care Plan section)       Frequency Min 1X/week     Co-evaluation               AM-PAC PT "6 Clicks" Mobility  Outcome Measure Help needed turning from your back to your side while in a flat bed without using bedrails?: None Help needed moving from lying on your back to sitting on the side of a flat bed without using bedrails?: None Help needed moving to and from a bed to a chair (including a wheelchair)?: A Little Help needed standing up from a chair using your arms (e.g., wheelchair or bedside chair)?: A Little Help needed to walk in hospital room?: A Little Help needed climbing 3-5 steps with a railing? : A Little 6 Click Score: 20    End of Session Equipment Utilized During Treatment: Gait belt Activity Tolerance: Patient limited by fatigue Patient left: with call bell/phone within reach;with chair alarm set;with family/visitor present Nurse Communication: Mobility status PT Visit Diagnosis: Muscle weakness  (generalized) (M62.81);Difficulty in walking, not elsewhere classified (R26.2)    Time: 1100-1120 PT Time Calculation (min) (ACUTE ONLY): 20 min   Charges:   PT Evaluation $PT Eval Low Complexity: 1 Low PT Treatments $Gait Training: 8-22 mins PT General Charges $$ ACUTE PT VISIT: 1 Visit         Malachi Pro, DPT 12/23/2023, 12:41 PM

## 2023-12-23 NOTE — Progress Notes (Signed)
 Progress Note   Patient: [name] [id number]:[id number] DOB: [birth year] DOA: 12/21/2023     2 DOS: the patient was seen and examined on 12/23/2023   Brief hospital course: [name] is a 83 y.o. female with medical history significant for asthma, osteoarthritis CHF, type 2 diabetes mellitus, GERD, hypertension, dyslipidemia, hypothyroidism, OSA, stage IV chronic kidney disease, and hypothyroidism who presented to the emergency room with productive cough over the last few days with associated chest congestion, fever, as well as chills.  She had COVID about a month ago.  She was noted to be mildly hypoxic with pulse oximeter reading 90% on room air with EMS.  This improved to 93% on 4 L of O2 by nasal cannula.  She is currently awaiting initiation of hemodialysis, but makes urine.  No chest pain or palpitations.  No nausea or vomiting or abdominal pain.  No dysuria, oliguria or hematuria or flank pain.  No bleeding diathesis.   Sometimes feels like food gets stuck in throat, liquids as well.  This started about 6 weeks ago. Has to swallow mulitple times and chew food up fine.   3/23: Continue to require up to 6 L of oxygen, she noticed intermittent desaturation for a while at home but does not have oxygen.  PT is recommending home health.  Will  Assessment and Plan: * CAP (community acquired pneumonia) Patient presented with productive cough and fever. She had 2 oxygen requirement and was afebrile at the hospital. She had no leukocytosis with elevated procalcitonin. CXR with no focal consolidation but sm pleural effusions and vascular congestion. She was started on CAP coverage with ceftriaxone and azithromycin.   Given noted fevers at home and elevated procalcitonin will continue antibiotics.   COPD exacerbation (HCC) Patient with recent PFTs, with FEV1/FVC ratio normal, FEV1 and FVC normal. She had no significant bronchodilator response. She did however note improvement in her symptoms with  broncho dilators. Her pulmonologist felt she could have mild obstructive airway disease.  -Continue breo inhaler while inpatient + PRN nebs  -Mucinex DM for cough  -Continue steroids with prednisone 40mg  for total of 5 days  Acute respiratory failure with hypoxia (HCC) Likely secondary to above. Possible component of exacerbation of HFpEF as well (BNP elevated and noted to have interstitial edema/vascular congestion on admission CXR) Wean O2 as able will Patient might need oxygen to go home as intermittent hypoxia has been noted for a while. -Will will ambulate with pulse ox tomorrow  HFpEF exacerbation  Clinical and laboratory evidence of volume overload. Continue antihypertensives.  Patient had pretty much normal echocardiogram 6 months ago. -Continue lasix 40mg  p.o. BID.  Not a candidate for SGLT2 inh given renal function  Strict I/Os Daily weights   CKD stage 4 Non oliguric     Latest Ref Rng & Units 12/23/2023    3:49 AM 12/22/2023    4:03 AM 12/21/2023    8:17 PM  BMP  Glucose 70 - 99 mg/dL 86  956    BUN 8 - 23 mg/dL 78  68    Creatinine 2.13 - 1.00 mg/dL 0.86  5.78  4.69   Sodium 135 - 145 mmol/L 139  136    Potassium 3.5 - 5.1 mmol/L 3.4  3.4    Chloride 98 - 111 mmol/L 110  109    CO2 22 - 32 mmol/L 23  19    Calcium 8.9 - 10.3 mg/dL 62.9  52.8     Creatinine with slight increase  today but remained within her baseline.  Continue with lasix. She will be starting HD soon.  Monitor renal function Avoid nephrotoxins  Normocytic anemia  Thrombocytopenia -mild     Latest Ref Rng & Units 12/23/2023    3:49 AM 12/22/2023    4:03 AM 12/21/2023    8:17 PM  CBC  WBC 4.0 - 10.5 K/uL 9.8  5.0    4.9  6.3   Hemoglobin 12.0 - 15.0 g/dL 8.8  8.0    8.0  8.9   Hematocrit 36.0 - 46.0 % 26.7  25.1    25.2  27.3   Platelets 150 - 400 K/uL 200  138    134  129    No obvious evidence of bleeding.  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  Also mild thrombocytopenia. -Anemia  panel with borderline folic acid -Patient get Aranesp at home -Started on folic acid supplement  Hypokalemia - Replete   Essential hypertension Continue amlodipine and hydralazine.  Depression Continue home Zoloft.  GERD without esophagitis Continue home PPI therapy.  Paroxysmal atrial fibrillation (HCC) In sinus rhythm.  Continue metoprolol tartrate and apixaban  Dyslipidemia Continue statin therapy  Hypothyroidism Continue home Synthroid.  Subjective: Patient was sitting in chair when seen today.  Continue to feel little short of breath.  Physical Exam: Vitals:   12/23/23 0751 12/23/23 0832 12/23/23 1124 12/23/23 1222  BP: (!) 161/63  (!) 149/48   Pulse: 71  62   Resp: 19  19   Temp: 98.6 F (37 C)  98.7 F (37.1 C)   TempSrc:      SpO2: 97% 94% 97% 96%  Weight:      Height:       General.  Frail elderly lady, who in no acute distress. Pulmonary.  Few basal crackles bilaterally, normal respiratory effort. CV.  Regular rate and rhythm, no JVD, rub or murmur. Abdomen.  Soft, nontender, nondistended, BS positive. CNS.  Alert and oriented .  No focal neurologic deficit. Extremities.  No edema, no cyanosis, pulses intact and symmetrical.   Data Reviewed:     Latest Ref Rng & Units 12/23/2023    3:49 AM 12/22/2023    4:03 AM 12/21/2023    8:17 PM  CBC  WBC 4.0 - 10.5 K/uL 9.8  5.0    4.9  6.3   Hemoglobin 12.0 - 15.0 g/dL 8.8  8.0    8.0  8.9   Hematocrit 36.0 - 46.0 % 26.7  25.1    25.2  27.3   Platelets 150 - 400 K/uL 200  138    134  129       Latest Ref Rng & Units 12/23/2023    3:49 AM 12/22/2023    4:03 AM 12/21/2023    8:17 PM  BMP  Glucose 70 - 99 mg/dL 86  161    BUN 8 - 23 mg/dL 78  68    Creatinine 0.96 - 1.00 mg/dL 0.45  4.09  8.11   Sodium 135 - 145 mmol/L 139  136    Potassium 3.5 - 5.1 mmol/L 3.4  3.4    Chloride 98 - 111 mmol/L 110  109    CO2 22 - 32 mmol/L 23  19    Calcium 8.9 - 10.3 mg/dL 91.4  78.2       Family  Communication:   Disposition: Status is: Inpatient Remains inpatient appropriate because: management of dyspnea/increased O2 requirement   Planned Discharge Destination: Home with home health  DVT prophylaxis.  Eliquis Time spent: 45 minutes  This record has been created using Conservation officer, historic buildings. Errors have been sought and corrected,but may not always be located. Such creation errors do not reflect on the standard of care.   Author: Arnetha Courser, MD 12/23/2023 1:26 PM  For on call review [email or web address].

## 2023-12-23 NOTE — Plan of Care (Signed)
  Problem: Fluid Volume: Goal: Hemodynamic stability will improve Outcome: Progressing   Problem: Education: Goal: Knowledge of General Education information will improve Description: Including pain rating scale, medication(s)/side effects and non-pharmacologic comfort measures Outcome: Progressing   Problem: Activity: Goal: Risk for activity intolerance will decrease Outcome: Progressing

## 2023-12-23 NOTE — Evaluation (Addendum)
 Occupational Therapy Evaluation Patient Details Name: [name] MRN: [id number] DOB: [birth year] Today's Date: 12/23/2023   History of Present Illness   Pt is an 83 year old female presents to the ED with productive cough over the last few days with associated chest congestion, fever, as well as chills, Admitted with CAP, COPD exacerbation, Acute respiratory failure with hypoxia, HFpEF exacerbation     PMH significant for asthma, osteoarthritis CHF, type 2 diabetes mellitus, GERD, hypertension, dyslipidemia, hypothyroidism, OSA, stage IV chronic kidney disease, and hypothyroidism     Clinical Impressions Chart reviewed, pt greeted semi supine in bed, alert and oriented x4, agreeable to OT evaluation. PTA pt reports she has been MOD I-I for ADL (sponge bathes daily and shower 1x per week with DME), assist for IADL as needed (niece cleans the house, manages meds) but pt drives, cooks. She has help from niece daily if needed, cares for her husband who has dementia with IADLs. Pt presents with deficits in endurance, activity tolerance, balance, affecting safe and optimal ADL completion. Bed mobility completed with supervision, STS with supervision, amb short distances in room approx 5' with RW with CGA to bsc and then to chair. Managed LB dressing with CGA, toileting with CGA. Pt is performing ADL below PLOF, Pt will benefit from acute OT to address functional deficits and to facilitate optimal ADL performance. Pt is left in chair, all needs met. OT will follow acutely.    Spo2 >90% on 7L via HFNC throughout      If plan is discharge home, recommend the following:   A little help with walking and/or transfers;A little help with bathing/dressing/bathroom;Help with stairs or ramp for entrance;Assistance with cooking/housework     Functional Status Assessment   Patient has had a recent decline in their functional status and demonstrates the ability to make significant improvements in function  in a reasonable and predictable amount of time.     Equipment Recommendations   None recommended by OT;Other (comment) (pt has recommended equipment)     Recommendations for Other Services         Precautions/Restrictions   Precautions Precautions: Fall Recall of Precautions/Restrictions: Intact Restrictions Weight Bearing Restrictions Per Provider Order: No     Mobility Bed Mobility Overal bed mobility: Needs Assistance Bed Mobility: Supine to Sit     Supine to sit: Supervision, HOB elevated          Transfers Overall transfer level: Needs assistance Equipment used: Rolling walker (2 wheels) Transfers: Sit to/from Stand Sit to Stand: Supervision                  Balance Overall balance assessment: Needs assistance Sitting-balance support: Feet supported Sitting balance-Leahy Scale: Good     Standing balance support: Bilateral upper extremity supported, During functional activity, Reliant on assistive device for balance Standing balance-Leahy Scale: Fair                             ADL either performed or assessed with clinical judgement   ADL Overall ADL's : Needs assistance/impaired Eating/Feeding: Set up;Sitting   Grooming: Sitting;Set up               Lower Body Dressing: Contact guard assist;Sit to/from stand Lower Body Dressing Details (indicate cue type and reason): depends Toilet Transfer: Contact guard assist;Rolling walker (2 wheels);BSC/3in1   Toileting- Clothing Manipulation and Hygiene: Supervision/safety;Sitting/lateral lean       Functional mobility during  ADLs: Contact guard assist;Rolling walker (2 wheels) (approx 5' in room, multiple transfers; pt still on 7L via HFNC so out of room mobility limited at this time)       Vision Patient Visual Report: No change from baseline       Perception         Praxis         Pertinent Vitals/Pain Pain Assessment Pain Assessment: No/denies pain      Extremity/Trunk Assessment Upper Extremity Assessment Upper Extremity Assessment: Overall WFL for tasks assessed   Lower Extremity Assessment Lower Extremity Assessment: Overall WFL for tasks assessed       Communication Communication Communication: No apparent difficulties   Cognition Arousal: Alert Behavior During Therapy: WFL for tasks assessed/performed Cognition: No apparent impairments                               Following commands: Intact       Cueing  General Comments   Cueing Techniques: Verbal cues  Pt pleasant and eager to see how well she can move, had expected fatigue with prolonged ambulation but ultimately safe and appropriate   Exercises Other Exercises Other Exercises: edu re: role of OT, role of rehab, discharge recommendations, DME use for safe ADL completion   Shoulder Instructions      Home Living Family/patient expects to be discharged to:: Private residence Living Arrangements: Spouse/significant other Available Help at Discharge: Family Type of Home: House Home Access: Stairs to enter [employer] of Steps: 1 Entrance Stairs-Rails: None Home Layout: Two level;Laundry or work area in basement;Able to live on main level with bedroom/bathroom Alternate Teacher, music of Steps: Flight Alternate Level Stairs-Rails: Right Bathroom Shower/Tub: Chief Strategy Officer: Standard Bathroom Accessibility: Yes   Home Equipment: Agricultural consultant (2 wheels);BSC/3in1;Tub bench;Grab bars - tub/shower;Wheelchair - manual   Additional Comments: does not access stairs in home regularly      Prior Functioning/Environment Prior Level of Function : Independent/Modified Independent             Mobility Comments: amb with no AD (reports she will use cane or walker if she is feeling off.) ADLs Comments: MOD I feeding, grooming, bathing (with shower chair), dressing; pt cooks, neice helps w cleaning, med  management; pt drives    OT Problem List: Decreased activity tolerance;Decreased knowledge of use of DME or AE;Impaired balance (sitting and/or standing)   OT Treatment/Interventions: Self-care/ADL training;DME and/or AE instruction;Therapeutic activities;Balance training;Therapeutic exercise;Patient/family education;Energy conservation      OT Goals(Current goals can be found in the care plan section)   Acute Rehab OT Goals Patient Stated Goal: go home OT Goal Formulation: With patient Time For Goal Achievement: 01/06/24 Potential to Achieve Goals: Good ADL Goals Pt Will Perform Grooming: with modified independence;sitting Pt Will Perform Lower Body Dressing: with modified independence;sitting/lateral leans;sit to/from stand Pt Will Transfer to Toilet: with modified independence;ambulating Pt Will Perform Toileting - Clothing Manipulation and hygiene: with modified independence;sitting/lateral leans;sit to/from stand   OT Frequency:  Min 2X/week    Co-evaluation              AM-PAC OT "6 Clicks" Daily Activity     Outcome Measure Help from another person eating meals?: None Help from another person taking care of personal grooming?: None Help from another person toileting, which includes using toliet, bedpan, or urinal?: None Help from another person bathing (including washing, rinsing, drying)?: A Little Help  from another person to put on and taking off regular upper body clothing?: None Help from another person to put on and taking off regular lower body clothing?: A Little 6 Click Score: 22   End of Session Equipment Utilized During Treatment: Rolling walker (2 wheels);Oxygen;Gait belt Nurse Communication: Mobility status  Activity Tolerance: Patient tolerated treatment well Patient left: in chair;with call bell/phone within reach;with chair alarm set  OT Visit Diagnosis: Other abnormalities of gait and mobility (R26.89)                Time: 1020-1046 OT Time  Calculation (min): 26 min Charges:  OT General Charges $OT Visit: 1 Visit OT Evaluation $OT Eval Moderate Complexity: 1 Mod  Oleta Mouse, OTD OTR/L  12/23/23, 1:05 PM

## 2023-12-24 ENCOUNTER — Inpatient Hospital Stay

## 2023-12-24 DIAGNOSIS — J441 Chronic obstructive pulmonary disease with (acute) exacerbation: Secondary | ICD-10-CM | POA: Diagnosis not present

## 2023-12-24 DIAGNOSIS — J9601 Acute respiratory failure with hypoxia: Secondary | ICD-10-CM | POA: Diagnosis not present

## 2023-12-24 DIAGNOSIS — E876 Hypokalemia: Secondary | ICD-10-CM | POA: Diagnosis not present

## 2023-12-24 DIAGNOSIS — J189 Pneumonia, unspecified organism: Secondary | ICD-10-CM | POA: Diagnosis not present

## 2023-12-24 LAB — RENAL FUNCTION PANEL
Albumin: 2.9 g/dL — ABNORMAL LOW (ref 3.5–5.0)
Anion gap: 16 — ABNORMAL HIGH (ref 5–15)
BUN: 85 mg/dL — ABNORMAL HIGH (ref 8–23)
CO2: 22 mmol/L (ref 22–32)
Calcium: 10 mg/dL (ref 8.9–10.3)
Chloride: 101 mmol/L (ref 98–111)
Creatinine, Ser: 2.88 mg/dL — ABNORMAL HIGH (ref 0.44–1.00)
GFR, Estimated: 16 mL/min — ABNORMAL LOW (ref >60)
Glucose, Bld: 124 mg/dL — ABNORMAL HIGH (ref 70–99)
Phosphorus: 2.7 mg/dL (ref 2.5–4.6)
Potassium: 3.7 mmol/L (ref 3.5–5.1)
Sodium: 139 mmol/L (ref 135–145)

## 2023-12-24 LAB — HEPATITIS B SURFACE ANTIGEN: Hepatitis B Surface Ag: NONREACTIVE

## 2023-12-24 NOTE — Plan of Care (Signed)

## 2023-12-24 NOTE — TOC Progression Note (Addendum)
 Transition of Care [facility]) - Progression Note    Patient Details  Name: [name] MRN: [id number] Date of Birth: 1941-06-25  Transition of Care [facility]) CM/SW Contact  Truddie Hidden, RN Phone Number: 12/24/2023, 9:57 AM  Clinical Narrative:    RNCM spoke with patient regarding therapy's recommendation for [facility] PT/ OT. Patient is agreeable to [facility] PT/OT and would like [facility] for her choice of agency. Patient advised the accepting agency will contact her directly to scheduled SOC within 48 post discharge. Patient stated "[facility]" will transport her home.  Referral sent to [facility] to [facility] for [facility] referral.           Expected Discharge Plan and Services                                               Social Determinants of Health (SDOH) Interventions SDOH Screenings   Food Insecurity: No Food Insecurity (12/22/2023)  Housing: Low Risk  (12/22/2023)  Transportation Needs: No Transportation Needs (12/22/2023)  Utilities: Not At Risk (12/22/2023)  Depression (PHQ2-9): Low Risk  (08/14/2023)  Financial Resource Strain: Low Risk  (06/12/2023)  Physical Activity: Insufficiently Active (06/12/2023)  Social Connections: Moderately Isolated (12/22/2023)  Stress: No Stress Concern Present (06/12/2023)  Tobacco Use: Low Risk  (12/21/2023)    Readmission Risk Interventions    07/30/2023   10:37 AM 04/19/2023    1:10 PM 03/27/2023   12:56 PM  Readmission Risk Prevention Plan  Transportation Screening Complete Complete Complete  Medication Review Oceanographer) Complete Complete Complete  PCP or Specialist appointment within 3-5 days of discharge Complete  Complete  HRI or Home Care Consult Complete Complete Complete  SW Recovery Care/Counseling Consult Complete Complete Complete  Palliative Care Screening Not Applicable Not Applicable Not Applicable  Skilled Nursing Facility Not Applicable Not Applicable Not Applicable

## 2023-12-24 NOTE — Progress Notes (Signed)
 Progress Note   Patient: [name] [id number] DOB: [birth year] DOA: 12/21/2023     3 DOS: the patient was seen and examined on 12/24/2023   Brief hospital course: [name] is a 83 y.o. female with medical history significant for asthma, osteoarthritis CHF, type 2 diabetes mellitus, GERD, hypertension, dyslipidemia, hypothyroidism, OSA, stage IV chronic kidney disease, and hypothyroidism who presented to the emergency room with productive cough over the last few days with associated chest congestion, fever, as well as chills.  She had COVID about a month ago.  She was noted to be mildly hypoxic with pulse oximeter reading 90% on room air with EMS.  This improved to 93% on 4 L of O2 by nasal cannula.  She is currently awaiting initiation of hemodialysis, but makes urine.  No chest pain or palpitations.  No nausea or vomiting or abdominal pain.  No dysuria, oliguria or hematuria or flank pain.  No bleeding diathesis.   Sometimes feels like food gets stuck in throat, liquids as well.  This started about 6 weeks ago. Has to swallow mulitple times and chew food up fine.   3/23: Continue to require up to 6 L of oxygen, she noticed intermittent desaturation for a while at home but does not have oxygen.  PT is recommending home health.  3/24: Oxygen requirement improved to 4 L, worsening renal function-nephrology was also consulted.  Holding p.o. Lasix today.  Assessment and Plan: * CAP (community acquired pneumonia) Patient presented with productive cough and fever. She had 2 oxygen requirement and was afebrile at the hospital. She had no leukocytosis with elevated procalcitonin. CXR with no focal consolidation but sm pleural effusions and vascular congestion. She was started on CAP coverage with ceftriaxone and azithromycin.   Given noted fevers at home and elevated procalcitonin will continue antibiotics.   COPD exacerbation (HCC) Patient with recent PFTs, with FEV1/FVC ratio normal, FEV1  and FVC normal. She had no significant bronchodilator response. She did however note improvement in her symptoms with broncho dilators. Her pulmonologist felt she could have mild obstructive airway disease.  -Continue breo inhaler while inpatient + PRN nebs  -Mucinex DM for cough  -Continue steroids with prednisone 40mg  for total of 5 days  Acute respiratory failure with hypoxia (HCC) Likely secondary to above. Possible component of exacerbation of HFpEF as well (BNP elevated and noted to have interstitial edema/vascular congestion on admission CXR) Wean O2 as able will Patient might need oxygen to go home as intermittent hypoxia has been noted for a while. -Will will ambulate with pulse ox tomorrow  HFpEF exacerbation  Clinical and laboratory evidence of volume overload. Continue antihypertensives.  Patient had pretty much normal echocardiogram 6 months ago. -Holding Lasix today due to increasing creatinine Not a candidate for SGLT2 inh given renal function  Strict I/Os Daily weights   CKD stage 4 Non oliguric     Latest Ref Rng & Units 12/24/2023    4:47 AM 12/23/2023    3:49 AM 12/22/2023    4:03 AM  BMP  Glucose 70 - 99 mg/dL 366  86  440   BUN 8 - 23 mg/dL 85  78  68   Creatinine 0.44 - 1.00 mg/dL 3.47  4.25  9.56   Sodium 135 - 145 mmol/L 139  139  136   Potassium 3.5 - 5.1 mmol/L 3.7  3.4  3.4   Chloride 98 - 111 mmol/L 101  110  109   CO2 22 - 32  mmol/L 22  23  19    Calcium 8.9 - 10.3 mg/dL 40.9  81.1  91.4    Creatinine slowly worsening. Holding Lasix. Nephrology was consulted Monitor renal function Avoid nephrotoxins  Normocytic anemia  Thrombocytopenia -mild     Latest Ref Rng & Units 12/23/2023    3:49 AM 12/22/2023    4:03 AM 12/21/2023    8:17 PM  CBC  WBC 4.0 - 10.5 K/uL 9.8  5.0    4.9  6.3   Hemoglobin 12.0 - 15.0 g/dL 8.8  8.0    8.0  8.9   Hematocrit 36.0 - 46.0 % 26.7  25.1    25.2  27.3   Platelets 150 - 400 K/uL 200  138    134  129    No  obvious evidence of bleeding.  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  Also mild thrombocytopenia. -Anemia panel with borderline folic acid -Patient get Aranesp at home -Started on folic acid supplement  Hypokalemia - Replete   Essential hypertension Continue amlodipine and hydralazine.  Depression Continue home Zoloft.  GERD without esophagitis Continue home PPI therapy.  Paroxysmal atrial fibrillation (HCC) In sinus rhythm.  Continue metoprolol tartrate and apixaban  Dyslipidemia Continue statin therapy  Hypothyroidism Continue home Synthroid.  Subjective: Patient was seen and examined today.  Denies any chest pain or shortness of breath.  Physical Exam: Vitals:   12/24/23 0451 12/24/23 0740 12/24/23 1142 12/24/23 1306  BP:  (!) 147/61 (!) 152/45   Pulse:  69 67   Resp:      Temp:  97.6 F (36.4 C) 97.6 F (36.4 C)   TempSrc:      SpO2:  97% 98% 95%  Weight: 74.1 kg     Height:       General.  Frail elderly lady, in no acute distress. Pulmonary.  Lungs clear bilaterally, normal respiratory effort. CV.  Regular rate and rhythm, no JVD, rub or murmur. Abdomen.  Soft, nontender, nondistended, BS positive. CNS.  Alert and oriented .  No focal neurologic deficit. Extremities.  No edema, no cyanosis, pulses intact and symmetrical.   Data Reviewed:     Latest Ref Rng & Units 12/23/2023    3:49 AM 12/22/2023    4:03 AM 12/21/2023    8:17 PM  CBC  WBC 4.0 - 10.5 K/uL 9.8  5.0    4.9  6.3   Hemoglobin 12.0 - 15.0 g/dL 8.8  8.0    8.0  8.9   Hematocrit 36.0 - 46.0 % 26.7  25.1    25.2  27.3   Platelets 150 - 400 K/uL 200  138    134  129       Latest Ref Rng & Units 12/24/2023    4:47 AM 12/23/2023    3:49 AM 12/22/2023    4:03 AM  BMP  Glucose 70 - 99 mg/dL 782  86  956   BUN 8 - 23 mg/dL 85  78  68   Creatinine 0.44 - 1.00 mg/dL 2.13  0.86  5.78   Sodium 135 - 145 mmol/L 139  139  136   Potassium 3.5 - 5.1 mmol/L 3.7  3.4  3.4   Chloride 98 - 111  mmol/L 101  110  109   CO2 22 - 32 mmol/L 22  23  19    Calcium 8.9 - 10.3 mg/dL 46.9  62.9  52.8      Family Communication: Discussed with husband at bedside  Disposition:  Status is: Inpatient Remains inpatient appropriate because: management of dyspnea/increased O2 requirement   Planned Discharge Destination: Home with home health  DVT prophylaxis.  Eliquis Time spent: 44 minutes  This record has been created using Conservation officer, historic buildings. Errors have been sought and corrected,but may not always be located. Such creation errors do not reflect on the standard of care.   Author: Arnetha Courser, MD 12/24/2023 3:26 PM  For on call review [email or web address].

## 2023-12-24 NOTE — Progress Notes (Signed)
 Physical Therapy Treatment Patient Details Name: [name] MRN: [id number] DOB: [birth year] Today's Date: 12/24/2023   History of Present Illness Pt is an 83 year old female presents to the ED with productive cough over the last few days with associated chest congestion, fever, as well as chills, Admitted with CAP, COPD exacerbation, Acute respiratory failure with hypoxia, HFpEF exacerbation     PMH significant for asthma, osteoarthritis CHF, type 2 diabetes mellitus, GERD, hypertension, dyslipidemia, hypothyroidism, OSA, stage IV chronic kidney disease, and hypothyroidism    PT Comments  OOB without assist.  Stands and walk 200' x 1 and 100' x 1 with RW and supervision/cgx 1 and assist to manage O2 tank.  She is on 4 lpm today and maintains sats >92% with mobility.  She stated she did not wear O2 at baseline at home.  Pt remains highly motivated and happy to get up to walk today.   If plan is discharge home, recommend the following: Assist for transportation;Assistance with cooking/housework   Can travel by private vehicle        Equipment Recommendations  None recommended by PT    Recommendations for Other Services       Precautions / Restrictions Precautions Precautions: Fall Recall of Precautions/Restrictions: Intact Restrictions Weight Bearing Restrictions Per Provider Order: No     Mobility  Bed Mobility Overal bed mobility: Modified Independent       Supine to sit: Modified independent (Device/Increase time)       Patient Response: Cooperative  Transfers Overall transfer level: Needs assistance Equipment used: Rolling walker (2 wheels) Transfers: Sit to/from Stand Sit to Stand: Supervision                Ambulation/Gait Ambulation/Gait assistance: Supervision Gait Distance (Feet): 200 Feet Assistive device: Rolling walker (2 wheels) Gait Pattern/deviations: Step-through pattern       General Gait Details: 200' x 1, 100' x 1  sats >92% on 4 lpm  O2   Stairs             Wheelchair Mobility     Tilt Bed Tilt Bed Patient Response: Cooperative  Modified Rankin (Stroke Patients Only)       Balance Overall balance assessment: Needs assistance Sitting-balance support: Feet supported Sitting balance-Leahy Scale: Good     Standing balance support: Bilateral upper extremity supported, During functional activity, Reliant on assistive device for balance Standing balance-Leahy Scale: Fair                              Communication    Cognition Arousal: Alert Behavior During Therapy: WFL for tasks assessed/performed   PT - Cognitive impairments: No apparent impairments                                Cueing    Exercises      General Comments        Pertinent Vitals/Pain Pain Assessment Pain Assessment: No/denies pain    Home Living                          Prior Function            PT Goals (current goals can now be found in the care plan section) Progress towards PT goals: Progressing toward goals    Frequency    Min 1X/week  PT Plan      Co-evaluation              AM-PAC PT "6 Clicks" Mobility   Outcome Measure  Help needed turning from your back to your side while in a flat bed without using bedrails?: None Help needed moving from lying on your back to sitting on the side of a flat bed without using bedrails?: None Help needed moving to and from a bed to a chair (including a wheelchair)?: None Help needed standing up from a chair using your arms (e.g., wheelchair or bedside chair)?: None Help needed to walk in hospital room?: A Little Help needed climbing 3-5 steps with a railing? : A Little 6 Click Score: 22    End of Session Equipment Utilized During Treatment: Gait belt Activity Tolerance: Patient tolerated treatment well Patient left: in bed;with bed alarm set;with call bell/phone within reach;with family/visitor present Nurse  Communication: Mobility status PT Visit Diagnosis: Muscle weakness (generalized) (M62.81);Difficulty in walking, not elsewhere classified (R26.2)     Time: 5784-6962 PT Time Calculation (min) (ACUTE ONLY): 16 min  Charges:    $Gait Training: 8-22 mins PT General Charges $$ ACUTE PT VISIT: 1 Visit                   Danielle Dess, PTA 12/24/23, 3:40 PM

## 2023-12-24 NOTE — Care Management Important Message (Signed)
 Important Message  Patient Details  Name: [name] MRN: [id number] Date of Birth: 07/06/1941   Important Message Given:  Yes - Medicare IM     Cristela Blue, CMA 12/24/2023, 12:51 PM

## 2023-12-25 ENCOUNTER — Ambulatory Visit: Admitting: Physician Assistant

## 2023-12-25 DIAGNOSIS — E876 Hypokalemia: Secondary | ICD-10-CM | POA: Diagnosis not present

## 2023-12-25 DIAGNOSIS — J189 Pneumonia, unspecified organism: Secondary | ICD-10-CM | POA: Diagnosis not present

## 2023-12-25 DIAGNOSIS — J441 Chronic obstructive pulmonary disease with (acute) exacerbation: Secondary | ICD-10-CM | POA: Diagnosis not present

## 2023-12-25 DIAGNOSIS — J9601 Acute respiratory failure with hypoxia: Secondary | ICD-10-CM | POA: Diagnosis not present

## 2023-12-25 LAB — RENAL FUNCTION PANEL
Albumin: 3.4 g/dL — ABNORMAL LOW (ref 3.5–5.0)
Anion gap: 9 (ref 5–15)
BUN: 91 mg/dL — ABNORMAL HIGH (ref 8–23)
CO2: 25 mmol/L (ref 22–32)
Calcium: 10.3 mg/dL (ref 8.9–10.3)
Chloride: 105 mmol/L (ref 98–111)
Creatinine, Ser: 2.63 mg/dL — ABNORMAL HIGH (ref 0.44–1.00)
GFR, Estimated: 18 mL/min — ABNORMAL LOW (ref >60)
Glucose, Bld: 91 mg/dL (ref 70–99)
Phosphorus: 2.7 mg/dL (ref 2.5–4.6)
Potassium: 3.3 mmol/L — ABNORMAL LOW (ref 3.5–5.1)
Sodium: 139 mmol/L (ref 135–145)

## 2023-12-25 LAB — HEPATITIS B SURFACE ANTIBODY, QUANTITATIVE: Hep B S AB Quant (Post): <3.5 m[IU]/mL — ABNORMAL LOW

## 2023-12-25 LAB — CBC
HCT: 29.3 % — ABNORMAL LOW (ref 36.0–46.0)
Hemoglobin: 9.3 g/dL — ABNORMAL LOW (ref 12.0–15.0)
MCH: 27.5 pg (ref 26.0–34.0)
MCHC: 31.7 g/dL (ref 30.0–36.0)
MCV: 86.7 fL (ref 80.0–100.0)
Platelets: 225 10*3/uL (ref 150–400)
RBC: 3.38 MIL/uL — ABNORMAL LOW (ref 3.87–5.11)
RDW: 16.6 % — ABNORMAL HIGH (ref 11.5–15.5)
WBC: 9.2 10*3/uL (ref 4.0–10.5)
nRBC: 0 % (ref 0.0–0.2)

## 2023-12-25 LAB — HEPATITIS B CORE ANTIBODY, TOTAL: HEP B CORE AB: NEGATIVE

## 2023-12-25 MED ORDER — PENTAFLUOROPROP-TETRAFLUOROETH EX AERO: 1.0000 | INHALATION_SPRAY | CUTANEOUS | Status: DC | PRN

## 2023-12-25 MED ORDER — LIDOCAINE-PRILOCAINE 2.5-2.5 % EX CREA: 1.0000 | TOPICAL_CREAM | CUTANEOUS | Status: DC | PRN

## 2023-12-25 MED ORDER — CHLORHEXIDINE GLUCONATE CLOTH 2 % EX PADS
6.0000 | MEDICATED_PAD | Freq: Every day | CUTANEOUS | Status: DC
  Administered 2023-12-25 – 2024-01-01 (×8): 6 via TOPICAL

## 2023-12-25 MED ORDER — IPRATROPIUM-ALBUTEROL 0.5-2.5 (3) MG/3ML IN SOLN
3.0000 mL | Freq: Three times a day (TID) | RESPIRATORY_TRACT | Status: DC
  Administered 2023-12-25 – 2023-12-27 (×5): 3 mL via RESPIRATORY_TRACT
  Filled 2023-12-25 (×5): qty 3

## 2023-12-25 MED ORDER — HEPARIN SODIUM (PORCINE) 1000 UNIT/ML DIALYSIS: 1000.0000 [IU] | INTRAMUSCULAR | Status: DC | PRN

## 2023-12-25 MED ORDER — ROSUVASTATIN CALCIUM 10 MG PO TABS
10.0000 mg | ORAL_TABLET | Freq: Every day | ORAL | Status: DC
  Administered 2023-12-26 – 2024-01-01 (×7): 10 mg via ORAL
  Filled 2023-12-25 (×7): qty 1

## 2023-12-25 MED ORDER — LOPERAMIDE HCL 2 MG PO CAPS
2.0000 mg | ORAL_CAPSULE | ORAL | Status: DC | PRN
Start: 2023-12-25 — End: 2023-12-25

## 2023-12-25 NOTE — Plan of Care (Signed)

## 2023-12-25 NOTE — Progress Notes (Signed)
 Progress Note   Patient: [name] ZOX:[id number] DOB: 04-Nov-1940 DOA: 12/21/2023     4 DOS: the patient was seen and examined on 12/25/2023   Brief hospital course: Aviella Disbrow Hiers is a 83 y.o. female with medical history significant for asthma, osteoarthritis CHF, type 2 diabetes mellitus, GERD, hypertension, dyslipidemia, hypothyroidism, OSA, stage IV chronic kidney disease, and hypothyroidism who presented to the emergency room with productive cough over the last few days with associated chest congestion, fever, as well as chills.  She had COVID about a month ago.  She was noted to be mildly hypoxic with pulse oximeter reading 90% on room air with EMS.  This improved to 93% on 4 L of O2 by nasal cannula.  She is currently awaiting initiation of hemodialysis, but makes urine.  No chest pain or palpitations.  No nausea or vomiting or abdominal pain.  No dysuria, oliguria or hematuria or flank pain.  No bleeding diathesis.   Sometimes feels like food gets stuck in throat, liquids as well.  This started about 6 weeks ago. Has to swallow mulitple times and chew food up fine.   3/23: Continue to require up to 6 L of oxygen, she noticed intermittent desaturation for a while at home but does not have oxygen.  PT is recommending home health.  3/24: Oxygen requirement improved to 4 L, worsening renal function-nephrology was also consulted.  Holding p.o. Lasix today.  3/25: Continue to require 4 to 5 L of oxygen.  She is being started on dialysis today.  Assessment and Plan: * CAP (community acquired pneumonia) Patient presented with productive cough and fever. She had 2 oxygen requirement and was afebrile at the hospital. She had no leukocytosis with elevated procalcitonin. CXR with no focal consolidation but sm pleural effusions and vascular congestion. She was started on CAP coverage with ceftriaxone and azithromycin-will complete 5-day course  Given noted fevers at home and elevated procalcitonin  will continue antibiotics.   COPD exacerbation (HCC) Patient with recent PFTs, with FEV1/FVC ratio normal, FEV1 and FVC normal. She had no significant bronchodilator response. She did however note improvement in her symptoms with broncho dilators. Her pulmonologist felt she could have mild obstructive airway disease.  -Continue breo inhaler while inpatient + PRN nebs  -Mucinex DM for cough  -Continue steroids with prednisone 40mg  for total of 5 days  Acute respiratory failure with hypoxia (HCC) Likely secondary to above. Possible component of exacerbation of HFpEF as well (BNP elevated and noted to have interstitial edema/vascular congestion on admission CXR) Wean O2 as able will Patient might need oxygen to go home as intermittent hypoxia has been noted for a while. -Will ambulate with pulse ox tomorrow  HFpEF exacerbation  Clinical and laboratory evidence of volume overload. Continue antihypertensives.  Patient had pretty much normal echocardiogram 6 months ago. -Holding Lasix today due to increasing creatinine Not a candidate for SGLT2 inh given renal function  Strict I/Os Daily weights   CKD stage 4 Non oliguric  Due to worsening creatinine despite holding Lasix nephrology was consulted and patient is being started on dialysis today she already had a fistula in place in the preparation as outpatient. Monitor renal function Avoid nephrotoxins  Normocytic anemia  Thrombocytopenia -mild  No obvious evidence of bleeding.  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  Also mild thrombocytopenia. -Anemia panel with borderline folic acid -Patient get Aranesp at home -Started on folic acid supplement  Hypokalemia - Replete   Essential hypertension Continue amlodipine and  hydralazine.  Depression Continue home Zoloft.  GERD without esophagitis Continue home PPI therapy.  Paroxysmal atrial fibrillation (HCC) In sinus rhythm.  Continue metoprolol tartrate and  apixaban  Dyslipidemia Continue statin therapy  Hypothyroidism Continue home Synthroid.  Subjective: Patient was having some bleeding from nose likely due to oxygen, denies any shortness of breath.  Physical Exam: Vitals:   12/25/23 0500 12/25/23 0748 12/25/23 1205 12/25/23 1354  BP:  (!) 156/50 (!) 145/55 (!) 145/47  Pulse:  63 62 60  Resp:    (!) 30  Temp:  97.6 F (36.4 C) 97.8 F (36.6 C) (!) 97.5 F (36.4 C)  TempSrc:    Oral  SpO2:  95% (!) 89% 98%  Weight: 74 kg     Height:       General.  Frail elderly lady, in no acute distress. Pulmonary.  Lungs clear bilaterally, normal respiratory effort. CV.  Regular rate and rhythm, no JVD, rub or murmur. Abdomen.  Soft, nontender, nondistended, BS positive. CNS.  Alert and oriented .  No focal neurologic deficit. Extremities.  No edema, no cyanosis, pulses intact and symmetrical.  Data Reviewed:     Latest Ref Rng & Units 12/25/2023    9:40 AM 12/23/2023    3:49 AM 12/22/2023    4:03 AM  CBC  WBC 4.0 - 10.5 K/uL 9.2  9.8  5.0    4.9   Hemoglobin 12.0 - 15.0 g/dL 9.3  8.8  8.0    8.0   Hematocrit 36.0 - 46.0 % 29.3  26.7  25.1    25.2   Platelets 150 - 400 K/uL 225  200  138    134       Latest Ref Rng & Units 12/25/2023    9:40 AM 12/24/2023    4:47 AM 12/23/2023    3:49 AM  BMP  Glucose 70 - 99 mg/dL 91  865  86   BUN 8 - 23 mg/dL 91  85  78   Creatinine 0.44 - 1.00 mg/dL 7.84  6.96  2.95   Sodium 135 - 145 mmol/L 139  139  139   Potassium 3.5 - 5.1 mmol/L 3.3  3.7  3.4   Chloride 98 - 111 mmol/L 105  101  110   CO2 22 - 32 mmol/L 25  22  23    Calcium 8.9 - 10.3 mg/dL 28.4  13.2  44.0      Family Communication:   Disposition: Status is: Inpatient Remains inpatient appropriate because: management of dyspnea/increased O2 requirement   Planned Discharge Destination: Home with home health  DVT prophylaxis.  Eliquis Time spent: 43 minutes  This record has been created using Manufacturing engineer. Errors have been sought and corrected,but may not always be located. Such creation errors do not reflect on the standard of care.   Author: Arnetha Courser, MD 12/25/2023 2:21 PM  For on call review [email or web address].

## 2023-12-25 NOTE — Progress Notes (Signed)
 [facility]  ROUNDING NOTE   Subjective:   [name] is a 83 y.o. female with past medical conditions including diastolic heart failure, hypertension, hypothyroidism, COPD, and chronic kidney disease stage IV. Patient presents to ED with shortness of breath and has been admitted for Hypoxia [R09.02] COPD exacerbation (HCC) [J44.1] CAP (community acquired pneumonia) [J18.9]  Patient is known to our practice and receives outpatient follow-up with Dr. Cherylann Ratel.  Patient was initially scheduled to begin outpatient dialysis treatments and was awaiting confirmation from outpatient clinic, [facility].  Patient states she has not felt well for about a week, reports fever during that time as well.  States she has had a cough.  Reports generalized malaise and weakness.  Labs on ED arrival concerning for potassium 3.2, serum bicarb 21, BUN 63, creatinine 2.30 with GFR 21, troponin 87, and hemoglobin 8.9.  Respiratory panel negative for influenza, COVID-19, and RSV.  Chest x-ray shows cardiopericardial silhouette with vascular shin and trace edema.  Also small pleural effusions left greater than right.  We have been consulted to evaluate need for dialysis initiation at this time.    Objective:  Vital signs in last 24 hours:  Temp:  [97.6 F (36.4 C)-97.8 F (36.6 C)] 97.8 F (36.6 C) (03/25 1205) Pulse Rate:  [62-71] 62 (03/25 1205) Resp:  [17-18] 17 (03/25 0420) BP: (145-158)/(47-64) 145/55 (03/25 1205) SpO2:  [89 %-98 %] 89 % (03/25 1205) Weight:  [74 kg] 74 kg (03/25 0500)  Weight change: -0.1 kg Filed Weights   12/23/23 0500 12/24/23 0451 12/25/23 0500  Weight: 74.7 kg 74.1 kg 74 kg    Intake/Output: I/O last 3 completed shifts: In: 480 [P.O.:480] Out: 500 [Urine:500]   Intake/Output this shift:  Total I/O In: 480 [P.O.:480] Out: -   Physical Exam: General: NAD  Head: Normocephalic, atraumatic. Moist oral mucosal membranes  Eyes: Anicteric  Lungs:   Basilar crackles.   Heart: Regular rate and rhythm  Abdomen:  Soft, nontender  Extremities:  No peripheral edema.  Neurologic: Nonfocal, moving all four extremities  Skin: No lesions  Access: Lt AVF    Basic Metabolic Panel: Recent Labs  Lab 12/21/23 1436 12/21/23 2017 12/22/23 0403 12/23/23 0349 12/24/23 0447 12/25/23 0940  NA 136  --  136 139 139 139  K 3.2*  --  3.4* 3.4* 3.7 3.3*  CL 105  --  109 110 101 105  CO2 21*  --  19* 23 22 25   GLUCOSE 135*  --  182* 86 124* 91  BUN 63*  --  68* 78* 85* 91*  CREATININE 2.30* 2.26* 2.38* 2.44* 2.88* 2.63*  CALCIUM 11.1*  --  10.6* 10.1 10.0 10.3  PHOS  --   --   --  2.9 2.7 2.7    Liver Function Tests: Recent Labs  Lab 12/23/23 0349 12/24/23 0447 12/25/23 0940  ALBUMIN 3.0* 2.9* 3.4*   No results for input(s): "LIPASE", "AMYLASE" in the last 168 hours. No results for input(s): "AMMONIA" in the last 168 hours.  CBC: Recent Labs  Lab 12/21/23 1436 12/21/23 2017 12/22/23 0403 12/23/23 0349 12/25/23 0940  WBC 6.9 6.3 4.9  5.0 9.8 9.2  NEUTROABS  --   --  4.0  --   --   HGB 8.9* 8.9* 8.0*  8.0* 8.8* 9.3*  HCT 27.6* 27.3* 25.2*  25.1* 26.7* 29.3*  MCV 87.3 86.9 88.7  88.4 86.7 86.7  PLT 145* 129* 134*  138* 200 225    Cardiac  Enzymes: No results for input(s): "CKTOTAL", "CKMB", "CKMBINDEX", "TROPONINI" in the last 168 hours.  BNP: Invalid input(s): "POCBNP"  CBG: No results for input(s): "GLUCAP" in the last 168 hours.  Microbiology: Results for orders placed or performed during the hospital encounter of 12/21/23  Resp panel by RT-PCR (RSV, Flu A&B, Covid) Anterior Nasal Swab     Status: None   Collection Time: 12/21/23  3:30 PM   Specimen: Anterior Nasal Swab  Result Value Ref Range Status   SARS Coronavirus 2 by RT PCR NEGATIVE NEGATIVE Final    Comment: (NOTE) SARS-CoV-2 target nucleic acids are NOT DETECTED.  The SARS-CoV-2 RNA is generally detectable in upper respiratory specimens during the  acute phase of infection. The lowest concentration of SARS-CoV-2 viral copies this assay can detect is 138 copies/mL. A negative result does not preclude SARS-Cov-2 infection and should not be used as the sole basis for treatment or other patient management decisions. A negative result may occur with  improper specimen collection/handling, submission of specimen other than nasopharyngeal swab, presence of viral mutation(s) within the areas targeted by this assay, and inadequate number of viral copies(<138 copies/mL). A negative result must be combined with clinical observations, patient history, and epidemiological information. The expected result is Negative.  Fact Sheet for Patients:  BloggerCourse.com  Fact Sheet for Healthcare Providers:  SeriousBroker.it  This test is no t yet approved or cleared by the [place] FDA and  has been authorized for detection and/or diagnosis of SARS-CoV-2 by FDA under an Emergency Use Authorization (EUA). This EUA will remain  in effect (meaning this test can be used) for the duration of the COVID-19 declaration under Section 564(b)(1) of the Act, 21 U.S.C.section 360bbb-3(b)(1), unless the authorization is terminated  or revoked sooner.       Influenza A by PCR NEGATIVE NEGATIVE Final   Influenza B by PCR NEGATIVE NEGATIVE Final    Comment: (NOTE) The Xpert Xpress SARS-CoV-2/FLU/RSV plus assay is intended as an aid in the diagnosis of influenza from Nasopharyngeal swab specimens and should not be used as a sole basis for treatment. Nasal washings and aspirates are unacceptable for Xpert Xpress SARS-CoV-2/FLU/RSV testing.  Fact Sheet for Patients: BloggerCourse.com  Fact Sheet for Healthcare Providers: SeriousBroker.it  This test is not yet approved or cleared by the [place] FDA and has been authorized for detection and/or  diagnosis of SARS-CoV-2 by FDA under an Emergency Use Authorization (EUA). This EUA will remain in effect (meaning this test can be used) for the duration of the COVID-19 declaration under Section 564(b)(1) of the Act, 21 U.S.C. section 360bbb-3(b)(1), unless the authorization is terminated or revoked.     Resp Syncytial Virus by PCR NEGATIVE NEGATIVE Final    Comment: (NOTE) Fact Sheet for Patients: BloggerCourse.com  Fact Sheet for Healthcare Providers: SeriousBroker.it  This test is not yet approved or cleared by the [place] FDA and has been authorized for detection and/or diagnosis of SARS-CoV-2 by FDA under an Emergency Use Authorization (EUA). This EUA will remain in effect (meaning this test can be used) for the duration of the COVID-19 declaration under Section 564(b)(1) of the Act, 21 U.S.C. section 360bbb-3(b)(1), unless the authorization is terminated or revoked.  Performed at [facility], [address] [place], [place] [address]   Blood Culture (routine x 2)     Status: None (Preliminary result)   Collection Time: 12/21/23  5:19 PM   Specimen: BLOOD  Result Value Ref Range Status   Specimen  Description BLOOD BLOOD RIGHT HAND  Final   Special Requests   Final    BOTTLES DRAWN AEROBIC ONLY Blood Culture adequate volume   Culture   Final    NO GROWTH 3 DAYS Performed at [facility], [address] [place], [place] [address]    Report Status PENDING  Incomplete  Blood Culture (routine x 2)     Status: None (Preliminary result)   Collection Time: 12/21/23  5:20 PM   Specimen: BLOOD  Result Value Ref Range Status   Specimen Description BLOOD BLOOD RIGHT ARM  Final   Special Requests   Final    BOTTLES DRAWN AEROBIC AND ANAEROBIC Blood Culture adequate volume   Culture   Final    NO GROWTH 3 DAYS Performed at [facility], [address], [place], [place] [address]     Report Status PENDING  Incomplete  Respiratory (~20 pathogens) panel by PCR     Status: None   Collection Time: 12/22/23  2:55 PM   Specimen: Nasopharyngeal Swab; Respiratory  Result Value Ref Range Status   Adenovirus NOT DETECTED NOT DETECTED Final   Coronavirus 229E NOT DETECTED NOT DETECTED Final    Comment: (NOTE) The Coronavirus on the Respiratory Panel, DOES NOT test for the novel  Coronavirus (2019 nCoV)    Coronavirus HKU1 NOT DETECTED NOT DETECTED Final   Coronavirus NL63 NOT DETECTED NOT DETECTED Final   Coronavirus OC43 NOT DETECTED NOT DETECTED Final   Metapneumovirus NOT DETECTED NOT DETECTED Final   Rhinovirus / Enterovirus NOT DETECTED NOT DETECTED Final   Influenza A NOT DETECTED NOT DETECTED Final   Influenza B NOT DETECTED NOT DETECTED Final   Parainfluenza Virus 1 NOT DETECTED NOT DETECTED Final   Parainfluenza Virus 2 NOT DETECTED NOT DETECTED Final   Parainfluenza Virus 3 NOT DETECTED NOT DETECTED Final   Parainfluenza Virus 4 NOT DETECTED NOT DETECTED Final   Respiratory Syncytial Virus NOT DETECTED NOT DETECTED Final   Bordetella pertussis NOT DETECTED NOT DETECTED Final   Bordetella Parapertussis NOT DETECTED NOT DETECTED Final   Chlamydophila pneumoniae NOT DETECTED NOT DETECTED Final   Mycoplasma pneumoniae NOT DETECTED NOT DETECTED Final    Comment: Performed at [facility], [address] [place], [place] [address]  MRSA Next Gen by PCR, Nasal     Status: None   Collection Time: 12/23/23  9:05 AM   Specimen: Nasal Mucosa; Nasal Swab  Result Value Ref Range Status   MRSA by PCR Next Gen NOT DETECTED NOT DETECTED Final    Comment: (NOTE) The GeneXpert MRSA Assay (FDA approved for NASAL specimens only), is one component of a comprehensive MRSA colonization surveillance program. It is not intended to diagnose MRSA infection nor to guide or monitor treatment for MRSA infections. Test performance is not FDA approved in patients less than 98  years old. Performed at [facility], [address] [place], [place] [address]     Coagulation Studies: No results for input(s): "LABPROT", "INR" in the last 72 hours.  Urinalysis: No results for input(s): "COLORURINE", "LABSPEC", "PHURINE", "GLUCOSEU", "HGBUR", "BILIRUBINUR", "KETONESUR", "PROTEINUR", "UROBILINOGEN", "NITRITE", "LEUKOCYTESUR" in the last 72 hours.  Invalid input(s): "APPERANCEUR"    Imaging: No results found.   Medications:    cefTRIAXone (ROCEPHIN)  IV 2 g (12/24/23 1842)    amLODipine  5 mg Oral Daily   apixaban  2.5 mg Oral BID   calcium-vitamin D  1 tablet Oral Daily   Chlorhexidine Gluconate Cloth  6 each Topical Q0600   cyanocobalamin  500 mcg Oral Daily   fluticasone furoate-vilanterol  1 puff Inhalation Daily   folic acid  1 mg Oral Daily   guaiFENesin  600 mg Oral BID   hydrALAZINE  100 mg Oral TID   ipratropium-albuterol  3 mL Nebulization TID   levothyroxine  150 mcg Oral Q0600   metoprolol tartrate  50 mg Oral BID   montelukast  10 mg Oral QHS   pantoprazole  40 mg Oral Daily   potassium chloride  10 mEq Oral Daily   predniSONE  40 mg Oral Q breakfast   [START ON 12/26/2023] rosuvastatin  10 mg Oral Daily   sertraline  100 mg Oral Daily   acetaminophen **OR** acetaminophen, albuterol, chlorpheniramine-HYDROcodone, heparin, lidocaine-prilocaine, magnesium hydroxide, ondansetron **OR** ondansetron (ZOFRAN) IV, pentafluoroprop-tetrafluoroeth, sodium chloride, traZODone  Assessment/ Plan:  [name] is a 83 y.o.  female with past medical conditions including diastolic heart failure, hypertension, hypothyroidism, COPD, and chronic kidney disease stage IV.   End-stage renal disease requiring hemodialysis.  Patient was originally scheduled to be set up outpatient however became symptomatic.  Will initiate hemodialysis during this admission.  First treatment today, second treatment scheduled for tomorrow.  Will confirm  outpatient placement at [facility]  2.  Acute respiratory failure, workup in progress for community-acquired pneumonia.  Chest x-ray shows vascular shin and trace edema with small pleural effusions, left greater than right.  Primary team has ordered IV Rocephin and azithromycin.  Supportive care.  3. Anemia of chronic kidney diseas Lab Results  Component Value Date   HGB 9.3 (L) 12/25/2023    Hemoglobin within optimal range.  Would consider low-dose EPO.  Will monitor for now.  4. Secondary Hyperparathyroidism: with outpatient labs: PTH 12, phosphorus 6.5, calcium 10 on 12/13/2023.   Lab Results  Component Value Date   CALCIUM 10.3 12/25/2023   PHOS 2.7 12/25/2023    Will continue to monitor bone minerals during this admission.  5.  Hypertension with chronic kidney disease.  Home regimen includes amlodipine, furosemide, hydralazine, and metoprolol.  Currently receiving these medications except furosemide.   LOS: 4 Hema Lanza 3/25/20251:39 PM

## 2023-12-25 NOTE — Progress Notes (Signed)
 Hemodialysis Note:  Received patient in bed to unit. Alert and oriented. Informed consent singed and in chart.  Treatment initiated: 1415 Treatment completed: 1615  Access used: Left Fistula Access issues: None  Patient tolerated well. Transported back to room, alert without acute distress. Report given to patient's RN.  Total UF removed: 0 Medications given: None  Post HD weight: 71.6 Kg  [facility]

## 2023-12-26 DIAGNOSIS — J189 Pneumonia, unspecified organism: Secondary | ICD-10-CM | POA: Diagnosis not present

## 2023-12-26 DIAGNOSIS — J441 Chronic obstructive pulmonary disease with (acute) exacerbation: Secondary | ICD-10-CM | POA: Diagnosis not present

## 2023-12-26 DIAGNOSIS — E876 Hypokalemia: Secondary | ICD-10-CM | POA: Diagnosis not present

## 2023-12-26 DIAGNOSIS — J9601 Acute respiratory failure with hypoxia: Secondary | ICD-10-CM | POA: Diagnosis not present

## 2023-12-26 LAB — CULTURE, BLOOD (ROUTINE X 2)
Culture: NO GROWTH
Culture: NO GROWTH
Special Requests: ADEQUATE
Special Requests: ADEQUATE

## 2023-12-26 MED ORDER — BISACODYL 10 MG RE SUPP: 10.0000 mg | Freq: Every day | RECTAL | Status: DC | PRN

## 2023-12-26 MED ORDER — EPOETIN ALFA-EPBX 4000 UNIT/ML IJ SOLN
4000.0000 [IU] | Freq: Once | INTRAMUSCULAR | Status: AC
Start: 2023-12-26 — End: 2023-12-27
  Administered 2023-12-27: 4000 [IU] via INTRAVENOUS
  Filled 2023-12-26: qty 1

## 2023-12-26 MED ORDER — DOCUSATE SODIUM 100 MG PO CAPS
100.0000 mg | ORAL_CAPSULE | Freq: Every day | ORAL | Status: AC | PRN
Start: 2023-12-26 — End: ?
  Administered 2023-12-26 – 2024-01-01 (×3): 100 mg via ORAL
  Filled 2023-12-26 (×3): qty 1

## 2023-12-26 MED ORDER — PENTAFLUOROPROP-TETRAFLUOROETH EX AERO
INHALATION_SPRAY | CUTANEOUS | Status: AC
  Filled 2023-12-26: qty 30

## 2023-12-26 NOTE — Progress Notes (Signed)
 Occupational Therapy Treatment Patient Details Name: [name] MRN: [id number] DOB: 1941/08/11 Today's Date: 12/26/2023   History of present illness Pt is an 83 year old female presents to the ED with productive cough over the last few days with associated chest congestion, fever, as well as chills, Admitted with CAP, COPD exacerbation, Acute respiratory failure with hypoxia, HFpEF exacerbation     PMH significant for asthma, osteoarthritis CHF, type 2 diabetes mellitus, GERD, hypertension, dyslipidemia, hypothyroidism, OSA, stage IV chronic kidney disease, and hypothyroidism   OT comments  Chart reviewed to date, pt greeted sitting on edge of bed, alert and oriented x4, agreeable to OT tx session targeting improving functional activity tolerance in preparation for ADL tasks. Improvements noted throughout, with pt amb in room/hallway approx 100' with RW with CGA-supervision, toilet transfer with CGA-supervision, toileting with supervision, grooming tasks with SET UP. VSS on 3L via [place] throughout. Discussed safe ADL completion with DME after discharge, all questions answered within scope. Pt is making progress towards goals, discharge recommendation remains appropriate.       If plan is discharge home, recommend the following:  A little help with walking and/or transfers;A little help with bathing/dressing/bathroom;Help with stairs or ramp for entrance;Assistance with cooking/housework   Equipment Recommendations  None recommended by OT;Other (comment) (pt has recommended equipment)    Recommendations for Other Services      Precautions / Restrictions Precautions Precautions: Fall Recall of Precautions/Restrictions: Intact Restrictions Weight Bearing Restrictions Per Provider Order: No       Mobility Bed Mobility               General bed mobility comments: NT on edge of bed pre/post session    Transfers Overall transfer level: Needs assistance Equipment used: Rolling  walker (2 wheels) Transfers: Sit to/from Stand Sit to Stand: Supervision                 Balance Overall balance assessment: Needs assistance Sitting-balance support: Feet supported Sitting balance-Leahy Scale: Good     Standing balance support: Bilateral upper extremity supported, During functional activity, Reliant on assistive device for balance Standing balance-Leahy Scale: Fair                             ADL either performed or assessed with clinical judgement   ADL Overall ADL's : Needs assistance/impaired Eating/Feeding: Set up;Sitting   Grooming: Set up;Sitting               Lower Body Dressing: Contact guard assist;Sit to/from stand Lower Body Dressing Details (indicate cue type and reason): depends Toilet Transfer: Supervision/safety;Contact guard assist;Ambulation;Regular Toilet;Rolling walker (2 wheels)   Toileting- Clothing Manipulation and Hygiene: Supervision/safety;Sitting/lateral lean       Functional mobility during ADLs: Supervision/safety;Contact guard assist;Rolling walker (2 wheels) (approx 100')      Extremity/Trunk Assessment              Vision       Perception     Praxis     Communication Communication Communication: No apparent difficulties   Cognition Arousal: Alert Behavior During Therapy: WFL for tasks assessed/performed Cognition: No apparent impairments                               Following commands: Intact        Cueing   Cueing Techniques: Verbal cues  Exercises Other Exercises Other Exercises:  edu re: role of OT, role of rehab, discharge recommendations, DME use for safe ADL completion    Shoulder Instructions       General Comments spo2 >90% on 3L via [place] throughout, no SOB noted    Pertinent Vitals/ Pain       Pain Assessment Pain Assessment: No/denies pain  Home Living                                          Prior Functioning/Environment               Frequency  Min 2X/week        Progress Toward Goals  OT Goals(current goals can now be found in the care plan section)  Progress towards OT goals: Progressing toward goals  Acute Rehab OT Goals Time For Goal Achievement: 01/06/24  Plan      Co-evaluation                 AM-PAC OT "6 Clicks" Daily Activity     Outcome Measure   Help from another person eating meals?: None Help from another person taking care of personal grooming?: None Help from another person toileting, which includes using toliet, bedpan, or urinal?: None Help from another person bathing (including washing, rinsing, drying)?: A Little Help from another person to put on and taking off regular upper body clothing?: None Help from another person to put on and taking off regular lower body clothing?: A Little 6 Click Score: 22    End of Session Equipment Utilized During Treatment: Rolling walker (2 wheels);Oxygen;Gait belt  OT Visit Diagnosis: Other abnormalities of gait and mobility (R26.89)   Activity Tolerance Patient tolerated treatment well   Patient Left with call bell/phone within reach;Other (comment) (sitting on edge of bed)   Nurse Communication          Time: 8657-8469 OT Time Calculation (min): 19 min  Charges: OT General Charges $OT Visit: 1 Visit OT Treatments $Self Care/Home Management : 8-22 mins  Oleta Mouse, OTD OTR/L  12/26/23, 1:00 PM

## 2023-12-26 NOTE — Progress Notes (Signed)
 Attempted to cannulate patient's HD Access 3x but was unsuccessful. She had a tiny infiltration on the bottom part of the access. Kept arm elevated and applied ice pack. HD NP was notified.

## 2023-12-26 NOTE — Progress Notes (Addendum)
 PT Cancellation Note  Patient Details Name: [name] MRN: [id number] DOB: 12/09/1940   Cancelled Treatment:     Pt is currently off floor in HD. Author will personally return tomorrow morning to progress pt with PT/OOB activity.    Rushie Chestnut 12/26/2023, 2:07 PM

## 2023-12-26 NOTE — Progress Notes (Signed)
 [facility]  ROUNDING NOTE   Subjective:   [name] is a 83 y.o. female with past medical conditions including diastolic heart failure, hypertension, hypothyroidism, COPD, and chronic kidney disease stage IV. Patient presents to ED with shortness of breath and has been admitted for Hypoxia [R09.02] COPD exacerbation (HCC) [J44.1] CAP (community acquired pneumonia) [J18.9]  Patient is known to our practice and receives outpatient follow-up with Dr. Cherylann Ratel.  Patient was initially scheduled to begin outpatient dialysis treatments and was awaiting confirmation from outpatient clinic, [facility].    Patient seen sitting at bedside Continues to complain of mild shortness of breath Tolerated initial dialysis treatment well yesterday  Second treatment scheduled for later today   Objective:  Vital signs in last 24 hours:  Temp:  [97.5 F (36.4 C)-98.9 F (37.2 C)] 98.9 F (37.2 C) (03/26 1159) Pulse Rate:  [50-63] 50 (03/26 1159) Resp:  [16-31] 18 (03/26 1159) BP: (135-154)/(44-66) 149/44 (03/26 1159) SpO2:  [95 %-100 %] 97 % (03/26 1307) Weight:  [71.6 kg-73.6 kg] 73.6 kg (03/26 0438)  Weight change: -2.4 kg Filed Weights   12/25/23 1354 12/25/23 1620 12/26/23 0438  Weight: 71.6 kg 71.6 kg 73.6 kg    Intake/Output: I/O last 3 completed shifts: In: 480 [P.O.:480] Out: 0    Intake/Output this shift:  No intake/output data recorded.  Physical Exam: General: NAD  Head: Normocephalic, atraumatic. Moist oral mucosal membranes  Eyes: Anicteric  Lungs:  Basilar crackles, normal effort.   Heart: Regular rate and rhythm  Abdomen:  Soft, nontender  Extremities:  No peripheral edema.  Neurologic: Nonfocal, moving all four extremities  Skin: No lesions  Access: Lt AVF    Basic Metabolic Panel: Recent Labs  Lab 12/21/23 1436 12/21/23 2017 12/22/23 0403 12/23/23 0349 12/24/23 0447 12/25/23 0940  NA 136  --  136 139 139 139  K 3.2*  --  3.4* 3.4*  3.7 3.3*  CL 105  --  109 110 101 105  CO2 21*  --  19* 23 22 25   GLUCOSE 135*  --  182* 86 124* 91  BUN 63*  --  68* 78* 85* 91*  CREATININE 2.30* 2.26* 2.38* 2.44* 2.88* 2.63*  CALCIUM 11.1*  --  10.6* 10.1 10.0 10.3  PHOS  --   --   --  2.9 2.7 2.7    Liver Function Tests: Recent Labs  Lab 12/23/23 0349 12/24/23 0447 12/25/23 0940  ALBUMIN 3.0* 2.9* 3.4*   No results for input(s): "LIPASE", "AMYLASE" in the last 168 hours. No results for input(s): "AMMONIA" in the last 168 hours.  CBC: Recent Labs  Lab 12/21/23 1436 12/21/23 2017 12/22/23 0403 12/23/23 0349 12/25/23 0940  WBC 6.9 6.3 4.9  5.0 9.8 9.2  NEUTROABS  --   --  4.0  --   --   HGB 8.9* 8.9* 8.0*  8.0* 8.8* 9.3*  HCT 27.6* 27.3* 25.2*  25.1* 26.7* 29.3*  MCV 87.3 86.9 88.7  88.4 86.7 86.7  PLT 145* 129* 134*  138* 200 225    Cardiac Enzymes: No results for input(s): "CKTOTAL", "CKMB", "CKMBINDEX", "TROPONINI" in the last 168 hours.  BNP: Invalid input(s): "POCBNP"  CBG: No results for input(s): "GLUCAP" in the last 168 hours.  Microbiology: Results for orders placed or performed during the hospital encounter of 12/21/23  Resp panel by RT-PCR (RSV, Flu A&B, Covid) Anterior Nasal Swab     Status: None   Collection Time: 12/21/23  3:30 PM  Specimen: Anterior Nasal Swab  Result Value Ref Range Status   SARS Coronavirus 2 by RT PCR NEGATIVE NEGATIVE Final    Comment: (NOTE) SARS-CoV-2 target nucleic acids are NOT DETECTED.  The SARS-CoV-2 RNA is generally detectable in upper respiratory specimens during the acute phase of infection. The lowest concentration of SARS-CoV-2 viral copies this assay can detect is 138 copies/mL. A negative result does not preclude SARS-Cov-2 infection and should not be used as the sole basis for treatment or other patient management decisions. A negative result may occur with  improper specimen collection/handling, submission of specimen other than nasopharyngeal  swab, presence of viral mutation(s) within the areas targeted by this assay, and inadequate number of viral copies(<138 copies/mL). A negative result must be combined with clinical observations, patient history, and epidemiological information. The expected result is Negative.  Fact Sheet for Patients:  BloggerCourse.com  Fact Sheet for Healthcare Providers:  SeriousBroker.it  This test is no t yet approved or cleared by the [place] FDA and  has been authorized for detection and/or diagnosis of SARS-CoV-2 by FDA under an Emergency Use Authorization (EUA). This EUA will remain  in effect (meaning this test can be used) for the duration of the COVID-19 declaration under Section 564(b)(1) of the Act, 21 U.S.C.section 360bbb-3(b)(1), unless the authorization is terminated  or revoked sooner.       Influenza A by PCR NEGATIVE NEGATIVE Final   Influenza B by PCR NEGATIVE NEGATIVE Final    Comment: (NOTE) The Xpert Xpress SARS-CoV-2/FLU/RSV plus assay is intended as an aid in the diagnosis of influenza from Nasopharyngeal swab specimens and should not be used as a sole basis for treatment. Nasal washings and aspirates are unacceptable for Xpert Xpress SARS-CoV-2/FLU/RSV testing.  Fact Sheet for Patients: BloggerCourse.com  Fact Sheet for Healthcare Providers: SeriousBroker.it  This test is not yet approved or cleared by the [place] FDA and has been authorized for detection and/or diagnosis of SARS-CoV-2 by FDA under an Emergency Use Authorization (EUA). This EUA will remain in effect (meaning this test can be used) for the duration of the COVID-19 declaration under Section 564(b)(1) of the Act, 21 U.S.C. section 360bbb-3(b)(1), unless the authorization is terminated or revoked.     Resp Syncytial Virus by PCR NEGATIVE NEGATIVE Final    Comment: (NOTE) Fact Sheet for  Patients: BloggerCourse.com  Fact Sheet for Healthcare Providers: SeriousBroker.it  This test is not yet approved or cleared by the [place] FDA and has been authorized for detection and/or diagnosis of SARS-CoV-2 by FDA under an Emergency Use Authorization (EUA). This EUA will remain in effect (meaning this test can be used) for the duration of the COVID-19 declaration under Section 564(b)(1) of the Act, 21 U.S.C. section 360bbb-3(b)(1), unless the authorization is terminated or revoked.  Performed at [facility], [address] [place], [place] [address]   Blood Culture (routine x 2)     Status: None   Collection Time: 12/21/23  5:19 PM   Specimen: BLOOD  Result Value Ref Range Status   Specimen Description BLOOD BLOOD RIGHT HAND  Final   Special Requests   Final    BOTTLES DRAWN AEROBIC ONLY Blood Culture adequate volume   Culture   Final    NO GROWTH 5 DAYS Performed at [facility], [address], [place], [place] [address]    Report Status 12/26/2023 FINAL  Final  Blood Culture (routine x 2)     Status: None   Collection Time:  12/21/23  5:20 PM   Specimen: BLOOD  Result Value Ref Range Status   Specimen Description BLOOD BLOOD RIGHT ARM  Final   Special Requests   Final    BOTTLES DRAWN AEROBIC AND ANAEROBIC Blood Culture adequate volume   Culture   Final    NO GROWTH 5 DAYS Performed at [facility], [address] [place], [place] [address]    Report Status 12/26/2023 FINAL  Final  Respiratory (~20 pathogens) panel by PCR     Status: None   Collection Time: 12/22/23  2:55 PM   Specimen: Nasopharyngeal Swab; Respiratory  Result Value Ref Range Status   Adenovirus NOT DETECTED NOT DETECTED Final   Coronavirus 229E NOT DETECTED NOT DETECTED Final    Comment: (NOTE) The Coronavirus on the Respiratory Panel, DOES NOT test for the novel  Coronavirus (2019 nCoV)    Coronavirus  HKU1 NOT DETECTED NOT DETECTED Final   Coronavirus NL63 NOT DETECTED NOT DETECTED Final   Coronavirus OC43 NOT DETECTED NOT DETECTED Final   Metapneumovirus NOT DETECTED NOT DETECTED Final   Rhinovirus / Enterovirus NOT DETECTED NOT DETECTED Final   Influenza A NOT DETECTED NOT DETECTED Final   Influenza B NOT DETECTED NOT DETECTED Final   Parainfluenza Virus 1 NOT DETECTED NOT DETECTED Final   Parainfluenza Virus 2 NOT DETECTED NOT DETECTED Final   Parainfluenza Virus 3 NOT DETECTED NOT DETECTED Final   Parainfluenza Virus 4 NOT DETECTED NOT DETECTED Final   Respiratory Syncytial Virus NOT DETECTED NOT DETECTED Final   Bordetella pertussis NOT DETECTED NOT DETECTED Final   Bordetella Parapertussis NOT DETECTED NOT DETECTED Final   Chlamydophila pneumoniae NOT DETECTED NOT DETECTED Final   Mycoplasma pneumoniae NOT DETECTED NOT DETECTED Final    Comment: Performed at [facility], [address] [place], [place] [address]  MRSA Next Gen by PCR, Nasal     Status: None   Collection Time: 12/23/23  9:05 AM   Specimen: Nasal Mucosa; Nasal Swab  Result Value Ref Range Status   MRSA by PCR Next Gen NOT DETECTED NOT DETECTED Final    Comment: (NOTE) The GeneXpert MRSA Assay (FDA approved for NASAL specimens only), is one component of a comprehensive MRSA colonization surveillance program. It is not intended to diagnose MRSA infection nor to guide or monitor treatment for MRSA infections. Test performance is not FDA approved in patients less than 77 years old. Performed at [facility], [address] [place], [place] [address]     Coagulation Studies: No results for input(s): "LABPROT", "INR" in the last 72 hours.  Urinalysis: No results for input(s): "COLORURINE", "LABSPEC", "PHURINE", "GLUCOSEU", "HGBUR", "BILIRUBINUR", "KETONESUR", "PROTEINUR", "UROBILINOGEN", "NITRITE", "LEUKOCYTESUR" in the last 72 hours.  Invalid input(s): "APPERANCEUR"    Imaging: No  results found.   Medications:      amLODipine  5 mg Oral Daily   apixaban  2.5 mg Oral BID   calcium-vitamin D  1 tablet Oral Daily   Chlorhexidine Gluconate Cloth  6 each Topical Q0600   cyanocobalamin  500 mcg Oral Daily   fluticasone furoate-vilanterol  1 puff Inhalation Daily   folic acid  1 mg Oral Daily   guaiFENesin  600 mg Oral BID   hydrALAZINE  100 mg Oral TID   ipratropium-albuterol  3 mL Nebulization TID   levothyroxine  150 mcg Oral Q0600   metoprolol tartrate  50 mg Oral BID   montelukast  10 mg Oral QHS   pantoprazole  40 mg Oral Daily   rosuvastatin  10 mg Oral Daily   sertraline  100 mg Oral Daily   acetaminophen **OR** acetaminophen, albuterol, chlorpheniramine-HYDROcodone, magnesium hydroxide, ondansetron **OR** ondansetron (ZOFRAN) IV, sodium chloride, traZODone  Assessment/ Plan:  [name] is a 83 y.o.  female with past medical conditions including diastolic heart failure, hypertension, hypothyroidism, COPD, and chronic kidney disease stage IV.   End-stage renal disease requiring hemodialysis.  Patient was originally scheduled to be set up outpatient however became symptomatic.   Patient received initial dialysis treatment yesterday, tolerated well.  Will perform second dialysis treatment today with light UF, 0.5 to 1 L as tolerated.  Next treatment scheduled for tomorrow. Dialysis coordinator notified of desired clinic, [facility], will confirm placement as it was initiated outpatient.  2.  Acute respiratory failure, workup in progress for community-acquired pneumonia.  Chest x-ray shows vascular shin and trace edema with small pleural effusions, left greater than right.  Primary team has received IV Rocephin and azithromycin.  Continue supportive care.  3. Anemia of chronic kidney diseas Lab Results  Component Value Date   HGB 9.3 (L) 12/25/2023    Hemoglobin within optimal range.  Will order Epogen 4000 units IV with dialysis.Marland Kitchen  4.  Secondary Hyperparathyroidism: with outpatient labs: PTH 12, phosphorus 6.5, calcium 10 on 12/13/2023.   Lab Results  Component Value Date   CALCIUM 10.3 12/25/2023   PHOS 2.7 12/25/2023    Calcium and phosphorus within desired range.  Furosemide for  5.  Hypertension with chronic kidney disease.  Home regimen includes amlodipine, furosemide, hydralazine, and metoprolol.  For some mild remains healed.   LOS: 5 [name] 3/26/20251:36 PM

## 2023-12-26 NOTE — Plan of Care (Signed)

## 2023-12-26 NOTE — Progress Notes (Signed)
 Progress Note   Patient: [name] ZOX:[id number] DOB: March 24, 1941 DOA: 12/21/2023     5 DOS: the patient was seen and examined on 12/26/2023   Brief hospital course: [name] is a 83 y.o. female with medical history significant for asthma, osteoarthritis CHF, type 2 diabetes mellitus, GERD, hypertension, dyslipidemia, hypothyroidism, OSA, stage IV chronic kidney disease, and hypothyroidism who presented to the emergency room with productive cough over the last few days with associated chest congestion, fever, as well as chills.  She had COVID about a month ago.  She was noted to be mildly hypoxic with pulse oximeter reading 90% on room air with EMS.  This improved to 93% on 4 L of O2 by nasal cannula.  She is currently awaiting initiation of hemodialysis, but makes urine.  No chest pain or palpitations.  No nausea or vomiting or abdominal pain.  No dysuria, oliguria or hematuria or flank pain.  No bleeding diathesis.   Sometimes feels like food gets stuck in throat, liquids as well.  This started about 6 weeks ago. Has to swallow mulitple times and chew food up fine.   3/23: Continue to require up to 6 L of oxygen, she noticed intermittent desaturation for a while at home but does not have oxygen.  PT is recommending home health.  3/24: Oxygen requirement improved to 4 L, worsening renal function-nephrology was also consulted.  Holding p.o. Lasix today.  3/25: Continue to require 4 to 5 L of oxygen.  She is being started on dialysis today.  3/26: Clinically seems improving, remained on 4 L of oxygen.  Had 1 session of dialysis yesterday, going for second today.  Assessment and Plan: * CAP (community acquired pneumonia) Patient presented with productive cough and fever. She had 2 oxygen requirement and was afebrile at the hospital. She had no leukocytosis with elevated procalcitonin. CXR with no focal consolidation but sm pleural effusions and vascular congestion.  Completed antibiotics  course.  COPD exacerbation (HCC) Patient with recent PFTs, with FEV1/FVC ratio normal, FEV1 and FVC normal. She had no significant bronchodilator response. She did however note improvement in her symptoms with broncho dilators. Her pulmonologist felt she could have mild obstructive airway disease.  -Continue breo inhaler while inpatient + PRN nebs  -Mucinex DM for cough  -Continue steroids with prednisone 40mg  for total of 5 days  Acute respiratory failure with hypoxia (HCC) Likely secondary to above. Possible component of exacerbation of HFpEF as well (BNP elevated and noted to have interstitial edema/vascular congestion on admission CXR) Wean O2 as able will Patient might need oxygen to go home as intermittent hypoxia has been noted for a while. -Will ambulate with pulse ox tomorrow  HFpEF exacerbation  Clinical and laboratory evidence of volume overload. Continue antihypertensives.  Patient had pretty much normal echocardiogram 6 months ago. -Holding Lasix today due to increasing creatinine Not a candidate for SGLT2 inh given renal function  Strict I/Os Daily weights   CKD stage 4 Non oliguric  Due to worsening creatinine despite holding Lasix nephrology was consulted and patient is being started on dialysis yesterday, she already had a fistula in place in the preparation as outpatient. Monitor renal function Avoid nephrotoxins  Normocytic anemia  Thrombocytopenia -mild  No obvious evidence of bleeding.  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  Also mild thrombocytopenia. -Anemia panel with borderline folic acid -Patient get Aranesp at home -Started on folic acid supplement  Hypokalemia - Replete   Essential hypertension Continue amlodipine and  hydralazine.  Depression Continue home Zoloft.  GERD without esophagitis Continue home PPI therapy.  Paroxysmal atrial fibrillation (HCC) In sinus rhythm.  Continue metoprolol tartrate and apixaban  Dyslipidemia Continue  statin therapy  Hypothyroidism Continue home Synthroid.  Subjective: Patient was seen and examined today.  She appears happy stating that she had a good night sleep.  No new concern.  Going for second session of dialysis.  Physical Exam: Vitals:   12/26/23 1159 12/26/23 1307 12/26/23 1346 12/26/23 1557  BP: (!) 149/44  (!) 144/49 (!) 135/47  Pulse: (!) 50  61 61  Resp: 18  18 19   Temp: 98.9 F (37.2 C)  97.7 F (36.5 C) 98.6 F (37 C)  TempSrc:   Oral   SpO2: 99% 97% 97% 97%  Weight:   73.5 kg   Height:       General.  Frail elderly lady, in no acute distress. Pulmonary.  Lungs clear bilaterally, normal respiratory effort. CV.  Regular rate and rhythm, no JVD, rub or murmur. Abdomen.  Soft, nontender, nondistended, BS positive. CNS.  Alert and oriented .  No focal neurologic deficit. Extremities.  No edema, no cyanosis, pulses intact and symmetrical. Psychiatry.  Judgment and insight appears normal.   Data Reviewed:     Latest Ref Rng & Units 12/25/2023    9:40 AM 12/23/2023    3:49 AM 12/22/2023    4:03 AM  CBC  WBC 4.0 - 10.5 K/uL 9.2  9.8  5.0    4.9   Hemoglobin 12.0 - 15.0 g/dL 9.3  8.8  8.0    8.0   Hematocrit 36.0 - 46.0 % 29.3  26.7  25.1    25.2   Platelets 150 - 400 K/uL 225  200  138    134       Latest Ref Rng & Units 12/25/2023    9:40 AM 12/24/2023    4:47 AM 12/23/2023    3:49 AM  BMP  Glucose 70 - 99 mg/dL 91  147  86   BUN 8 - 23 mg/dL 91  85  78   Creatinine 0.44 - 1.00 mg/dL 8.29  5.62  1.30   Sodium 135 - 145 mmol/L 139  139  139   Potassium 3.5 - 5.1 mmol/L 3.3  3.7  3.4   Chloride 98 - 111 mmol/L 105  101  110   CO2 22 - 32 mmol/L 25  22  23    Calcium 8.9 - 10.3 mg/dL 86.5  78.4  69.6      Family Communication: Discussed with husband at bedside  Disposition: Status is: Inpatient Remains inpatient appropriate because: management of dyspnea/increased O2 requirement   Planned Discharge Destination: Home with home health  DVT  prophylaxis.  Eliquis Time spent: 44 minutes  This record has been created using Conservation officer, historic buildings. Errors have been sought and corrected,but may not always be located. Such creation errors do not reflect on the standard of care.   Author: Arnetha Courser, MD 12/26/2023 4:19 PM  For on call review [email or web address].

## 2023-12-27 DIAGNOSIS — J189 Pneumonia, unspecified organism: Secondary | ICD-10-CM | POA: Diagnosis not present

## 2023-12-27 LAB — RENAL FUNCTION PANEL
Albumin: 2.8 g/dL — ABNORMAL LOW (ref 3.5–5.0)
Anion gap: 11 (ref 5–15)
BUN: 78 mg/dL — ABNORMAL HIGH (ref 8–23)
CO2: 24 mmol/L (ref 22–32)
Calcium: 8.8 mg/dL — ABNORMAL LOW (ref 8.9–10.3)
Chloride: 101 mmol/L (ref 98–111)
Creatinine, Ser: 2.41 mg/dL — ABNORMAL HIGH (ref 0.44–1.00)
GFR, Estimated: 20 mL/min — ABNORMAL LOW (ref >60)
Glucose, Bld: 87 mg/dL (ref 70–99)
Phosphorus: 2.6 mg/dL (ref 2.5–4.6)
Potassium: 3.5 mmol/L (ref 3.5–5.1)
Sodium: 136 mmol/L (ref 135–145)

## 2023-12-27 LAB — CBC
HCT: 24.2 % — ABNORMAL LOW (ref 36.0–46.0)
Hemoglobin: 7.9 g/dL — ABNORMAL LOW (ref 12.0–15.0)
MCH: 27.9 pg (ref 26.0–34.0)
MCHC: 32.6 g/dL (ref 30.0–36.0)
MCV: 85.5 fL (ref 80.0–100.0)
Platelets: 219 10*3/uL (ref 150–400)
RBC: 2.83 MIL/uL — ABNORMAL LOW (ref 3.87–5.11)
RDW: 16.3 % — ABNORMAL HIGH (ref 11.5–15.5)
WBC: 8.9 10*3/uL (ref 4.0–10.5)
nRBC: 0 % (ref 0.0–0.2)

## 2023-12-27 MED ORDER — IPRATROPIUM-ALBUTEROL 0.5-2.5 (3) MG/3ML IN SOLN
3.0000 mL | Freq: Two times a day (BID) | RESPIRATORY_TRACT | Status: DC
  Administered 2023-12-27 – 2023-12-28 (×3): 3 mL via RESPIRATORY_TRACT
  Filled 2023-12-27 (×4): qty 3

## 2023-12-27 MED ORDER — EPOETIN ALFA-EPBX 4000 UNIT/ML IJ SOLN
INTRAMUSCULAR | Status: AC
  Filled 2023-12-27: qty 1

## 2023-12-27 MED ORDER — EPOETIN ALFA-EPBX 4000 UNIT/ML IJ SOLN
4000.0000 [IU] | INTRAMUSCULAR | Status: DC
  Administered 2023-12-28: 4000 [IU] via INTRAVENOUS
  Filled 2023-12-27 (×4): qty 1

## 2023-12-27 NOTE — Progress Notes (Signed)
 [facility]  ROUNDING NOTE   Subjective:   [name] is a 83 y.o. female with past medical conditions including diastolic heart failure, hypertension, hypothyroidism, COPD, and chronic kidney disease stage IV. Patient presents to ED with shortness of breath and has been admitted for Hypoxia [R09.02] COPD exacerbation (HCC) [J44.1] CAP (community acquired pneumonia) [J18.9]  Patient is known to our practice and receives outpatient follow-up with Dr. Cherylann Ratel.  Patient was initially scheduled to begin outpatient dialysis treatments and was awaiting confirmation from outpatient clinic, [facility].    Patient seen and evaluated during dialysis   HEMODIALYSIS FLOWSHEET:  Blood Flow Rate (mL/min): 199 mL/min Arterial Pressure (mmHg): -119.59 mmHg Venous Pressure (mmHg): 158.58 mmHg TMP (mmHg): 39.79 mmHg Ultrafiltration Rate (mL/min): 1198 mL/min Dialysate Flow Rate (mL/min): 299 ml/min  No concerns or complaints at this time Tolerating treatment well   Objective:  Vital signs in last 24 hours:  Temp:  [97.7 F (36.5 C)-98.6 F (37 C)] 97.9 F (36.6 C) (03/27 1106) Pulse Rate:  [52-66] 55 (03/27 1222) Resp:  [17-22] 18 (03/27 1219) BP: (135-169)/(42-55) 139/42 (03/27 1219) SpO2:  [93 %-100 %] 98 % (03/27 1219) Weight:  [70.3 kg-73.5 kg] 70.3 kg (03/27 1139)  Weight change: 1.9 kg Filed Weights   12/27/23 0519 12/27/23 0818 12/27/23 1139  Weight: 73 kg 71.4 kg 70.3 kg    Intake/Output: No intake/output data recorded.   Intake/Output this shift:  Total I/O In: -  Out: 1000 [Other:1000]  Physical Exam: General: NAD  Head: Normocephalic, atraumatic. Moist oral mucosal membranes  Eyes: Anicteric  Lungs:  Faint crackles., normal effort.   Heart: Regular rate and rhythm  Abdomen:  Soft, nontender  Extremities:  No peripheral edema.  Neurologic: Nonfocal, moving all four extremities  Skin: No lesions  Access: Lt AVF    Basic Metabolic  Panel: Recent Labs  Lab 12/22/23 0403 12/23/23 0349 12/24/23 0447 12/25/23 0940 12/27/23 0748  NA 136 139 139 139 136  K 3.4* 3.4* 3.7 3.3* 3.5  CL 109 110 101 105 101  CO2 19* 23 22 25 24   GLUCOSE 182* 86 124* 91 87  BUN 68* 78* 85* 91* 78*  CREATININE 2.38* 2.44* 2.88* 2.63* 2.41*  CALCIUM 10.6* 10.1 10.0 10.3 8.8*  PHOS  --  2.9 2.7 2.7 2.6    Liver Function Tests: Recent Labs  Lab 12/23/23 0349 12/24/23 0447 12/25/23 0940 12/27/23 0748  ALBUMIN 3.0* 2.9* 3.4* 2.8*   No results for input(s): "LIPASE", "AMYLASE" in the last 168 hours. No results for input(s): "AMMONIA" in the last 168 hours.  CBC: Recent Labs  Lab 12/21/23 2017 12/22/23 0403 12/23/23 0349 12/25/23 0940 12/27/23 0748  WBC 6.3 4.9  5.0 9.8 9.2 8.9  NEUTROABS  --  4.0  --   --   --   HGB 8.9* 8.0*  8.0* 8.8* 9.3* 7.9*  HCT 27.3* 25.2*  25.1* 26.7* 29.3* 24.2*  MCV 86.9 88.7  88.4 86.7 86.7 85.5  PLT 129* 134*  138* 200 225 219    Cardiac Enzymes: No results for input(s): "CKTOTAL", "CKMB", "CKMBINDEX", "TROPONINI" in the last 168 hours.  BNP: Invalid input(s): "POCBNP"  CBG: No results for input(s): "GLUCAP" in the last 168 hours.  Microbiology: Results for orders placed or performed during the hospital encounter of 12/21/23  Resp panel by RT-PCR (RSV, Flu A&B, Covid) Anterior Nasal Swab     Status: None   Collection Time: 12/21/23  3:30 PM   Specimen: Anterior  Nasal Swab  Result Value Ref Range Status   SARS Coronavirus 2 by RT PCR NEGATIVE NEGATIVE Final    Comment: (NOTE) SARS-CoV-2 target nucleic acids are NOT DETECTED.  The SARS-CoV-2 RNA is generally detectable in upper respiratory specimens during the acute phase of infection. The lowest concentration of SARS-CoV-2 viral copies this assay can detect is 138 copies/mL. A negative result does not preclude SARS-Cov-2 infection and should not be used as the sole basis for treatment or other patient management decisions. A  negative result may occur with  improper specimen collection/handling, submission of specimen other than nasopharyngeal swab, presence of viral mutation(s) within the areas targeted by this assay, and inadequate number of viral copies(<138 copies/mL). A negative result must be combined with clinical observations, patient history, and epidemiological information. The expected result is Negative.  Fact Sheet for Patients:  BloggerCourse.com  Fact Sheet for Healthcare Providers:  SeriousBroker.it  This test is no t yet approved or cleared by the [place] FDA and  has been authorized for detection and/or diagnosis of SARS-CoV-2 by FDA under an Emergency Use Authorization (EUA). This EUA will remain  in effect (meaning this test can be used) for the duration of the COVID-19 declaration under Section 564(b)(1) of the Act, 21 U.S.C.section 360bbb-3(b)(1), unless the authorization is terminated  or revoked sooner.       Influenza A by PCR NEGATIVE NEGATIVE Final   Influenza B by PCR NEGATIVE NEGATIVE Final    Comment: (NOTE) The Xpert Xpress SARS-CoV-2/FLU/RSV plus assay is intended as an aid in the diagnosis of influenza from Nasopharyngeal swab specimens and should not be used as a sole basis for treatment. Nasal washings and aspirates are unacceptable for Xpert Xpress SARS-CoV-2/FLU/RSV testing.  Fact Sheet for Patients: BloggerCourse.com  Fact Sheet for Healthcare Providers: SeriousBroker.it  This test is not yet approved or cleared by the [place] FDA and has been authorized for detection and/or diagnosis of SARS-CoV-2 by FDA under an Emergency Use Authorization (EUA). This EUA will remain in effect (meaning this test can be used) for the duration of the COVID-19 declaration under Section 564(b)(1) of the Act, 21 U.S.C. section 360bbb-3(b)(1), unless the authorization  is terminated or revoked.     Resp Syncytial Virus by PCR NEGATIVE NEGATIVE Final    Comment: (NOTE) Fact Sheet for Patients: BloggerCourse.com  Fact Sheet for Healthcare Providers: SeriousBroker.it  This test is not yet approved or cleared by the [place] FDA and has been authorized for detection and/or diagnosis of SARS-CoV-2 by FDA under an Emergency Use Authorization (EUA). This EUA will remain in effect (meaning this test can be used) for the duration of the COVID-19 declaration under Section 564(b)(1) of the Act, 21 U.S.C. section 360bbb-3(b)(1), unless the authorization is terminated or revoked.  Performed at [facility], [address] [place], [place] [address]   Blood Culture (routine x 2)     Status: None   Collection Time: 12/21/23  5:19 PM   Specimen: BLOOD  Result Value Ref Range Status   Specimen Description BLOOD BLOOD RIGHT HAND  Final   Special Requests   Final    BOTTLES DRAWN AEROBIC ONLY Blood Culture adequate volume   Culture   Final    NO GROWTH 5 DAYS Performed at [facility], [address], [place], [place] [address]    Report Status 12/26/2023 FINAL  Final  Blood Culture (routine x 2)     Status: None   Collection Time: 12/21/23  5:20 PM   Specimen: BLOOD  Result Value Ref Range Status   Specimen Description BLOOD BLOOD RIGHT ARM  Final   Special Requests   Final    BOTTLES DRAWN AEROBIC AND ANAEROBIC Blood Culture adequate volume   Culture   Final    NO GROWTH 5 DAYS Performed at [facility], [address] [place], [place] [address]    Report Status 12/26/2023 FINAL  Final  Respiratory (~20 pathogens) panel by PCR     Status: None   Collection Time: 12/22/23  2:55 PM   Specimen: Nasopharyngeal Swab; Respiratory  Result Value Ref Range Status   Adenovirus NOT DETECTED NOT DETECTED Final   Coronavirus 229E NOT DETECTED NOT DETECTED Final    Comment:  (NOTE) The Coronavirus on the Respiratory Panel, DOES NOT test for the novel  Coronavirus (2019 nCoV)    Coronavirus HKU1 NOT DETECTED NOT DETECTED Final   Coronavirus NL63 NOT DETECTED NOT DETECTED Final   Coronavirus OC43 NOT DETECTED NOT DETECTED Final   Metapneumovirus NOT DETECTED NOT DETECTED Final   Rhinovirus / Enterovirus NOT DETECTED NOT DETECTED Final   Influenza A NOT DETECTED NOT DETECTED Final   Influenza B NOT DETECTED NOT DETECTED Final   Parainfluenza Virus 1 NOT DETECTED NOT DETECTED Final   Parainfluenza Virus 2 NOT DETECTED NOT DETECTED Final   Parainfluenza Virus 3 NOT DETECTED NOT DETECTED Final   Parainfluenza Virus 4 NOT DETECTED NOT DETECTED Final   Respiratory Syncytial Virus NOT DETECTED NOT DETECTED Final   Bordetella pertussis NOT DETECTED NOT DETECTED Final   Bordetella Parapertussis NOT DETECTED NOT DETECTED Final   Chlamydophila pneumoniae NOT DETECTED NOT DETECTED Final   Mycoplasma pneumoniae NOT DETECTED NOT DETECTED Final    Comment: Performed at [facility], [address], [place], [place] [address]  MRSA Next Gen by PCR, Nasal     Status: None   Collection Time: 12/23/23  9:05 AM   Specimen: Nasal Mucosa; Nasal Swab  Result Value Ref Range Status   MRSA by PCR Next Gen NOT DETECTED NOT DETECTED Final    Comment: (NOTE) The GeneXpert MRSA Assay (FDA approved for NASAL specimens only), is one component of a comprehensive MRSA colonization surveillance program. It is not intended to diagnose MRSA infection nor to guide or monitor treatment for MRSA infections. Test performance is not FDA approved in patients less than 85 years old. Performed at [facility], [address] [place], [place] [address]     Coagulation Studies: No results for input(s): "LABPROT", "INR" in the last 72 hours.  Urinalysis: No results for input(s): "COLORURINE", "LABSPEC", "PHURINE", "GLUCOSEU", "HGBUR", "BILIRUBINUR", "KETONESUR", "PROTEINUR",  "UROBILINOGEN", "NITRITE", "LEUKOCYTESUR" in the last 72 hours.  Invalid input(s): "APPERANCEUR"    Imaging: No results found.   Medications:      amLODipine  5 mg Oral Daily   apixaban  2.5 mg Oral BID   calcium-vitamin D  1 tablet Oral Daily   Chlorhexidine Gluconate Cloth  6 each Topical Q0600   cyanocobalamin  500 mcg Oral Daily   fluticasone furoate-vilanterol  1 puff Inhalation Daily   folic acid  1 mg Oral Daily   guaiFENesin  600 mg Oral BID   hydrALAZINE  100 mg Oral TID   ipratropium-albuterol  3 mL Nebulization BID   levothyroxine  150 mcg Oral Q0600   metoprolol tartrate  50 mg Oral BID   montelukast  10 mg Oral QHS   pantoprazole  40 mg  Oral Daily   rosuvastatin  10 mg Oral Daily   sertraline  100 mg Oral Daily   acetaminophen **OR** acetaminophen, albuterol, bisacodyl, chlorpheniramine-HYDROcodone, docusate sodium, ondansetron **OR** ondansetron (ZOFRAN) IV, sodium chloride, traZODone  Assessment/ Plan:  [name] is a 83 y.o.  female with past medical conditions including diastolic heart failure, hypertension, hypothyroidism, COPD, and chronic kidney disease stage IV.   End-stage renal disease requiring hemodialysis.  Patient was originally scheduled to be set up outpatient however became symptomatic.   Unable to perform dialysis treatment yesterday due to torturous access.  Undergoing treatment today, no concerns.  UF goal 1 L as tolerated.  Next treatment scheduled for Friday. Dialysis coordinator notified of desired clinic, [facility], will confirm placement  2.  Acute respiratory failure, workup in progress for community-acquired pneumonia.  Chest x-ray shows vascular shin and trace edema with small pleural effusions, left greater than right.  Primary team has received IV Rocephin and azithromycin.  Continue supportive care.  3. Anemia of chronic kidney diseas Lab Results  Component Value Date   HGB 7.9 (L) 12/27/2023    Hemoglobin  within optimal range.  Will order Epogen 4000 units IV with next dialysis.Marland Kitchen  4. Secondary Hyperparathyroidism: with outpatient labs: PTH 12, phosphorus 6.5, calcium 10 on 12/13/2023.   Lab Results  Component Value Date   CALCIUM 8.8 (L) 12/27/2023   PHOS 2.6 12/27/2023    Will continue to monitor bone minerals.  5.  Hypertension with chronic kidney disease.  Home regimen includes amlodipine, furosemide, hydralazine, and metoprolol.  Furosemide held at this time.   LOS: 6 Franck Vinal 3/27/20251:26 PM

## 2023-12-27 NOTE — Progress Notes (Signed)
 PROGRESS NOTE    [name]  UXL:[id number] DOB: 04-Apr-1941 DOA: 12/21/2023 PCP: Patient, No Pcp Per  [id number]  LOS: 6 days   Brief hospital course:   Assessment & Plan: [name] is a 83 y.o. female with medical history significant for asthma, osteoarthritis CHF, type 2 diabetes mellitus, GERD, hypertension, dyslipidemia, hypothyroidism, OSA, stage IV chronic kidney disease, and hypothyroidism who presented to the emergency room with productive cough over the last few days with associated chest congestion, fever, as well as chills.  She had COVID about a month ago.  She was noted to be mildly hypoxic with pulse oximeter reading 90% on room air with EMS.  This improved to 93% on 4 L of O2 by nasal cannula.  She is currently awaiting initiation of hemodialysis, but makes urine.    * CAP (community acquired pneumonia) Patient presented with productive cough and fever. She had new oxygen requirement and was afebrile at the hospital. She had no leukocytosis with elevated procalcitonin. CXR with no focal consolidation but sm pleural effusions and vascular congestion.  Completed antibiotics course.   COPD exacerbation (HCC) Patient with recent PFTs, with FEV1/FVC ratio normal, FEV1 and FVC normal. She had no significant bronchodilator response. She did however note improvement in her symptoms with broncho dilators. Her pulmonologist felt she could have mild obstructive airway disease.  --completed steroid burst --cont bronchodilator   Acute respiratory failure with hypoxia (HCC) Likely secondary to above. Possible component of exacerbation of HFpEF as well (BNP elevated and noted to have interstitial edema/vascular congestion on admission CXR) --currently on 4L --Continue supplemental O2 to keep sats >=90%, wean as tolerated --walk test prior to discharge   Acute on chronic HFpEF exacerbation  Clinical and laboratory evidence of volume overload. Continue antihypertensives.  Patient  had pretty much normal echocardiogram 6 months ago. -Holding Lasix today due to increasing creatinine --dialysis for volume removal   CKD stage 4 now ESRD Due to worsening creatinine despite holding Lasix nephrology was consulted and patient is being started on dialysis, she already had a fistula in place in the preparation as outpatient. --need to confirm outpatient dialysis placement   Normocytic anemia  Thrombocytopenia -mild  No obvious evidence of bleeding.  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  Also mild thrombocytopenia. -Anemia panel with borderline folic acid -Patient get Aranesp at home --cont folic acid suppl (new)   Hypokalemia --correct with dialysis   Essential hypertension --cont amlodipine, hydralazine, Lopressor   Depression Continue home Zoloft.   GERD without esophagitis Continue home PPI therapy.   Paroxysmal atrial fibrillation (HCC) In sinus rhythm.   Continue metoprolol tartrate and apixaban   Dyslipidemia Continue statin therapy   Hypothyroidism Continue home Synthroid.   DVT prophylaxis: [id number] Code Status: DNR  Family Communication:  Level of care: Telemetry Cardiac Dispo:   The patient is from: home Anticipated d/c is to: home Anticipated d/c date is: when outpatient dialysis placement confirmed   Subjective and Interval History:  Pt reported some dyspnea, though improved.   Objective: Vitals:   12/27/23 1219 12/27/23 1222 12/27/23 1431 12/27/23 1951  BP: (!) 139/42  (!) 120/56 (!) 118/41  Pulse: (!) 55 (!) 55 (!) 55 (!) 55  Resp: 18  18 20   Temp: 98 F (36.7 C)  98.4 F (36.9 C) 97.9 F (36.6 C)  TempSrc: Oral     SpO2: 98%  99% 98%  Weight:      Height:  Intake/Output Summary (Last 24 hours) at 12/27/2023 1957 Last data filed at 12/27/2023 1900 Gross per 24 hour  Intake 240 ml  Output 1000 ml  Net -760 ml   Filed Weights   12/27/23 0519 12/27/23 0818 12/27/23 1139  Weight: 73 kg 71.4 kg 70.3 kg     Examination:   Constitutional: NAD, AAOx3 HEENT: conjunctivae and lids normal, EOMI CV: No cyanosis.   RESP: normal respiratory effort, on 4L Neuro: II - XII grossly intact.   Psych: Normal mood and affect.  Appropriate judgement and reason   Data Reviewed: I have personally reviewed labs and imaging studies  Time spent: 50 minutes  Darlin Priestly, MD [facility] If 7PM-7AM, please contact night-coverage 12/27/2023, 7:57 PM

## 2023-12-27 NOTE — Progress Notes (Signed)
  Received patient in bed to unit.   Informed consent signed and in chart.    TX duration:2.5hrs     Transported back to floor  Hand-off given to patient's nurse. No c/o and no acute distress noted    Access used: L AVF Access issues: none    Total UF removed: 1.0L Medication(s) given: retacrit Post HD VS: wnl Post HD weight: 70.3kg     Lynann Beaver  [facility]

## 2023-12-27 NOTE — Plan of Care (Signed)
   Problem: Fluid Volume: Goal: Hemodynamic stability will improve Outcome: Progressing   Problem: Clinical Measurements: Goal: Diagnostic test results will improve Outcome: Progressing Goal: Signs and symptoms of infection will decrease Outcome: Progressing   Problem: Respiratory: Goal: Ability to maintain adequate ventilation will improve Outcome: Progressing

## 2023-12-27 NOTE — Plan of Care (Signed)

## 2023-12-27 NOTE — Care Management Important Message (Signed)
 Important Message  Patient Details  Name: [name] MRN: [id number] Date of Birth: [birth year]   Important Message Given:  Yes - Medicare IM     Satina Jerrell, Stephan Minister 12/27/2023, 2:51 PM

## 2023-12-27 NOTE — Progress Notes (Signed)
 Physical Therapy Treatment Patient Details Name: [name] MRN: [id number] DOB: [birth year] Today's Date: 12/27/2023   History of Present Illness Pt is an 83 year old female presents to the ED with productive cough over the last few days with associated chest congestion, fever, as well as chills, Admitted with CAP, COPD exacerbation, Acute respiratory failure with hypoxia, HFpEF exacerbation     PMH significant for asthma, osteoarthritis CHF, type 2 diabetes mellitus, GERD, hypertension, dyslipidemia, hypothyroidism, OSA, stage IV chronic kidney disease, and hypothyroidism    PT Comments  Pt was long sitting in bed talking on phone upon arrival. She is A and O x 4 and extremely pleasant and motivated. Pt was easily and safely able to exit bed, stand to RW and tolerate ambulate>200 ft. Pt was on 2 L o2 throughout session with sao2 > 92%. Acute PT will continue to follow and progress per current POC. Dc recs remain appropriate. Pt will benefit from continued skilled PT to maximize her independence and safety with all ADLs.    If plan is discharge home, recommend the following: Assist for transportation;Assistance with cooking/housework     Equipment Recommendations  None recommended by PT       Precautions / Restrictions Precautions Precautions: Fall Recall of Precautions/Restrictions: Intact Restrictions Weight Bearing Restrictions Per Provider Order: No     Mobility  Bed Mobility Overal bed mobility: Modified Independent Bed Mobility: Supine to Sit  Supine to sit: Modified independent (Device/Increase time), HOB elevated   Transfers Overall transfer level: Needs assistance Equipment used: Rolling walker (2 wheels) Transfers: Sit to/from Stand Sit to Stand: Supervision   Ambulation/Gait Ambulation/Gait assistance: Supervision Gait Distance (Feet): 200 Feet Assistive device: Rolling walker (2 wheels) Gait Pattern/deviations: WFL(Within Functional Limits) Gait velocity: WNL   General Gait Details: Pt was easily and safely able to ambulate ~ 200 ft with RW on 2L o2. sao2 >92%. no LOB or SOB. pt tolerates gait well.    Balance Overall balance assessment: Needs assistance Sitting-balance support: Feet supported Sitting balance-Leahy Scale: Good     Standing balance support: Bilateral upper extremity supported, During functional activity Standing balance-Leahy Scale: Good       Communication Communication Communication: No apparent difficulties  Cognition Arousal: Alert Behavior During Therapy: WFL for tasks assessed/performed   PT - Cognitive impairments: No apparent impairments   Following commands: Intact      Cueing Cueing Techniques: Verbal cues     General Comments General comments (skin integrity, edema, etc.): sao2 >92% on 2 L o2      Pertinent Vitals/Pain Pain Assessment Pain Assessment: No/denies pain     PT Goals (current goals can now be found in the care plan section) Acute Rehab PT Goals Patient Stated Goal: get better so I can get home Progress towards PT goals: Progressing toward goals    Frequency    Min 1X/week       AM-PAC PT "6 Clicks" Mobility   Outcome Measure  Help needed turning from your back to your side while in a flat bed without using bedrails?: None Help needed moving from lying on your back to sitting on the side of a flat bed without using bedrails?: None Help needed moving to and from a bed to a chair (including a wheelchair)?: None Help needed standing up from a chair using your arms (e.g., wheelchair or bedside chair)?: None Help needed to walk in hospital room?: A Little Help needed climbing 3-5 steps with a railing? : A Little 6  Click Score: 22    End of Session Equipment Utilized During Treatment: Oxygen (2 L) Activity Tolerance: Patient tolerated treatment well Patient left: in chair;with call bell/phone within reach;with chair alarm set Nurse Communication: Mobility status PT Visit  Diagnosis: Muscle weakness (generalized) (M62.81);Difficulty in walking, not elsewhere classified (R26.2)     Time: 2956-2130 PT Time Calculation (min) (ACUTE ONLY): 19 min  Charges:    $Gait Training: 8-22 mins PT General Charges $$ ACUTE PT VISIT: 1 Visit                     Jetta Lout PTA 12/27/23, 1:43 PM

## 2023-12-27 NOTE — TOC Progression Note (Signed)
 Transition of Care [facility]) - Progression Note    Patient Details  Name: [name] MRN: [id number] Date of Birth: 03-14-1941  [facility] CM/SW Contact  Margarito Liner, LCSW Phone Number: 12/27/2023, 12:52 PM  Clinical Narrative:   Patient confirmed she has not established care with her new PCP yet. Per chart review, she has an appointment in April. [facility] liaison is aware. [employer] technologist has agreed to cover orders until she has established care with new provider.  Expected Discharge Plan and Services                                               Social Determinants of Health (SDOH) Interventions SDOH Screenings   Food Insecurity: No Food Insecurity (12/22/2023)  Housing: Low Risk  (12/22/2023)  Transportation Needs: No Transportation Needs (12/22/2023)  Utilities: Not At Risk (12/22/2023)  Depression (PHQ2-9): Low Risk  (08/14/2023)  Financial Resource Strain: Low Risk  (06/12/2023)  Physical Activity: Insufficiently Active (06/12/2023)  Social Connections: Moderately Isolated (12/22/2023)  Stress: No Stress Concern Present (06/12/2023)  Tobacco Use: Low Risk  (12/21/2023)    Readmission Risk Interventions    07/30/2023   10:37 AM 04/19/2023    1:10 PM 03/27/2023   12:56 PM  Readmission Risk Prevention Plan  Transportation Screening Complete Complete Complete  Medication Review Oceanographer) Complete Complete Complete  PCP or Specialist appointment within 3-5 days of discharge Complete  Complete  HRI or Home Care Consult Complete Complete Complete  SW Recovery Care/Counseling Consult Complete Complete Complete  Palliative Care Screening Not Applicable Not Applicable Not Applicable  Skilled Nursing Facility Not Applicable Not Applicable Not Applicable

## 2023-12-28 DIAGNOSIS — J189 Pneumonia, unspecified organism: Secondary | ICD-10-CM | POA: Diagnosis not present

## 2023-12-28 LAB — RENAL FUNCTION PANEL
Albumin: 2.9 g/dL — ABNORMAL LOW (ref 3.5–5.0)
Anion gap: 10 (ref 5–15)
BUN: 64 mg/dL — ABNORMAL HIGH (ref 8–23)
CO2: 25 mmol/L (ref 22–32)
Calcium: 8.6 mg/dL — ABNORMAL LOW (ref 8.9–10.3)
Chloride: 98 mmol/L (ref 98–111)
Creatinine, Ser: 2.49 mg/dL — ABNORMAL HIGH (ref 0.44–1.00)
GFR, Estimated: 19 mL/min — ABNORMAL LOW (ref >60)
Glucose, Bld: 87 mg/dL (ref 70–99)
Phosphorus: 2.4 mg/dL — ABNORMAL LOW (ref 2.5–4.6)
Potassium: 3.9 mmol/L (ref 3.5–5.1)
Sodium: 133 mmol/L — ABNORMAL LOW (ref 135–145)

## 2023-12-28 LAB — CBC
HCT: 26 % — ABNORMAL LOW (ref 36.0–46.0)
Hemoglobin: 8.4 g/dL — ABNORMAL LOW (ref 12.0–15.0)
MCH: 27.8 pg (ref 26.0–34.0)
MCHC: 32.3 g/dL (ref 30.0–36.0)
MCV: 86.1 fL (ref 80.0–100.0)
Platelets: 212 10*3/uL (ref 150–400)
RBC: 3.02 MIL/uL — ABNORMAL LOW (ref 3.87–5.11)
RDW: 16.3 % — ABNORMAL HIGH (ref 11.5–15.5)
WBC: 9.7 10*3/uL (ref 4.0–10.5)
nRBC: 0 % (ref 0.0–0.2)

## 2023-12-28 MED ORDER — ALTEPLASE 2 MG IJ SOLR: 2.0000 mg | Freq: Once | INTRAMUSCULAR | Status: DC | PRN

## 2023-12-28 MED ORDER — PENTAFLUOROPROP-TETRAFLUOROETH EX AERO: 1.0000 | INHALATION_SPRAY | CUTANEOUS | Status: DC | PRN

## 2023-12-28 MED ORDER — HEPARIN SODIUM (PORCINE) 1000 UNIT/ML DIALYSIS: 25.0000 [IU]/kg | INTRAMUSCULAR | Status: DC | PRN

## 2023-12-28 MED ORDER — HEPARIN SODIUM (PORCINE) 1000 UNIT/ML DIALYSIS: 1000.0000 [IU] | INTRAMUSCULAR | Status: DC | PRN

## 2023-12-28 MED ORDER — LIDOCAINE-PRILOCAINE 2.5-2.5 % EX CREA: 1.0000 | TOPICAL_CREAM | CUTANEOUS | Status: DC | PRN

## 2023-12-28 NOTE — Plan of Care (Signed)

## 2023-12-28 NOTE — Plan of Care (Signed)
  Problem: Fluid Volume: Goal: Hemodynamic stability will improve Outcome: Progressing   Problem: Clinical Measurements: Goal: Diagnostic test results will improve Outcome: Progressing Goal: Signs and symptoms of infection will decrease Outcome: Progressing   Problem: Clinical Measurements: Goal: Signs and symptoms of infection will decrease Outcome: Progressing   Problem: Respiratory: Goal: Ability to maintain adequate ventilation will improve Outcome: Progressing   Problem: Education: Goal: Knowledge of General Education information will improve Description: Including pain rating scale, medication(s)/side effects and non-pharmacologic comfort measures Outcome: Progressing

## 2023-12-28 NOTE — Progress Notes (Signed)
 PROGRESS NOTE    [name]  KVQ:[id number] DOB: Apr 04, 1941 DOA: 12/21/2023 PCP: Patient, No Pcp Per  233A/233A-AA  LOS: 7 days   Brief hospital course:   Assessment & Plan: [name] is a 83 y.o. female with medical history significant for asthma, osteoarthritis CHF, type 2 diabetes mellitus, GERD, hypertension, dyslipidemia, hypothyroidism, OSA, stage IV chronic kidney disease, and hypothyroidism who presented to the emergency room with productive cough over the last few days with associated chest congestion, fever, as well as chills.  She had COVID about a month ago.  She was noted to be mildly hypoxic with pulse oximeter reading 90% on room air with EMS.  This improved to 93% on 4 L of O2 by nasal cannula.  She is currently awaiting initiation of hemodialysis, but makes urine.    * CAP (community acquired pneumonia) Patient presented with productive cough and fever. She had new oxygen requirement and was afebrile at the hospital. She had no leukocytosis with elevated procalcitonin. CXR with no focal consolidation but sm pleural effusions and vascular congestion.  Completed antibiotics course.   COPD exacerbation (HCC) Patient with recent PFTs, with FEV1/FVC ratio normal, FEV1 and FVC normal. She had no significant bronchodilator response. She did however note improvement in her symptoms with broncho dilators. Her pulmonologist felt she could have mild obstructive airway disease.  --completed steroid burst --cont bronchodilator and DuoNeb   Acute respiratory failure with hypoxia (HCC) Likely secondary to above. Possible component of exacerbation of HFpEF as well (BNP elevated and noted to have interstitial edema/vascular congestion on admission CXR) --weaned down to RA today.   Acute on chronic HFpEF exacerbation  Clinical and laboratory evidence of volume overload. Continue antihypertensives.  Patient had pretty much normal echocardiogram 6 months ago. -Holding Lasix due to  increasing creatinine --dialysis for volume removal   CKD stage 4 now ESRD Due to worsening creatinine despite holding Lasix nephrology was consulted and patient is being started on dialysis, she already had a fistula in place in the preparation as outpatient. --need to confirm outpatient dialysis placement   Normocytic anemia  Thrombocytopenia -mild  No obvious evidence of bleeding.  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  Also mild thrombocytopenia. -Anemia panel with borderline folic acid -Patient get Aranesp at home --cont folic acid suppl (new)   Hypokalemia --correct with dialysis   Essential hypertension --cont amlodipine, hydralazine, Lopressor   Depression Continue home Zoloft.   GERD without esophagitis --cont PPI   Paroxysmal atrial fibrillation (HCC) In sinus rhythm.   Continue metoprolol tartrate and apixaban   Dyslipidemia Continue statin therapy   Hypothyroidism Continue home Synthroid.   DVT prophylaxis: IE:[id number] Code Status: DNR  Family Communication: husband updated at bedside today Level of care: Telemetry Cardiac Dispo:   The patient is from: home Anticipated d/c is to: home Anticipated d/c date is: when outpatient dialysis placement confirmed   Subjective and Interval History:  Ambulation test today showed pt was sating well on room air with walking.   Objective: Vitals:   12/28/23 0800 12/28/23 1221 12/28/23 1335 12/28/23 1730  BP: (!) 176/51  (!) 133/41 (!) 126/45  Pulse: 60  (!) 57 (!) 51  Resp: (!) 21  16 17   Temp:   97.7 F (36.5 C)   TempSrc:      SpO2:  97% 95% 97%  Weight:      Height:        Intake/Output Summary (Last 24 hours) at 12/28/2023 1921 Last data  filed at 12/28/2023 1700 Gross per 24 hour  Intake 480 ml  Output 700 ml  Net -220 ml   Filed Weights   12/27/23 1139 12/28/23 0500 12/28/23 0756  Weight: 70.3 kg 72.8 kg 71.9 kg    Examination:   Constitutional: NAD, AAOx3 HEENT: conjunctivae and lids  normal, EOMI CV: No cyanosis.   RESP: normal respiratory effort, on RA Neuro: II - XII grossly intact.   Psych: Normal mood and affect.  Appropriate judgement and reason   Data Reviewed: I have personally reviewed labs and imaging studies  Time spent: 35 minutes  Darlin Priestly, MD [facility] If 7PM-7AM, please contact night-coverage 12/28/2023, 7:21 PM

## 2023-12-28 NOTE — Progress Notes (Signed)
 Oxygen Requirements are as follows:  Patient @ rest on Room Air: 97% Patient ambulating on Room Air with exertion: 94%

## 2023-12-28 NOTE — Progress Notes (Signed)
 Pt down to unit for dialysis.  Arterial needle cannulated well.  Venous needle cannulated x3 all blood clots pulled.  Warm towel placed on arm after second venous attempt.  Clots pulled again.  NP made aware and decision to send pt back to their room and try dialysis again in the morning.  Report given to primary nurse and informed nurse that tomorrow morning before pt comes to dialysis to please wrap pts AVF arm in warm compresses to help with cannulation .

## 2023-12-28 NOTE — Progress Notes (Signed)
 [facility]  ROUNDING NOTE   Subjective:   [name] is a 83 y.o. female with past medical conditions including diastolic heart failure, hypertension, hypothyroidism, COPD, and chronic kidney disease stage IV. Patient presents to ED with shortness of breath and has been admitted for Hypoxia [R09.02] COPD exacerbation (HCC) [J44.1] CAP (community acquired pneumonia) [J18.9]  Patient is known to our practice and receives outpatient follow-up with Dr. Cherylann Ratel.  Patient was initially scheduled to begin outpatient dialysis treatments and was awaiting confirmation from outpatient clinic, [facility].    Patient seen and evaluated in dialysis suite. Pleasant demeanor Denies pain or discomfort  HD RN having concerns with fistula. Arterial placed, venous concerns.    Objective:  Vital signs in last 24 hours:  Temp:  [97.9 F (36.6 C)-98.5 F (36.9 C)] 98.4 F (36.9 C) (03/28 0756) Pulse Rate:  [55-61] 60 (03/28 0800) Resp:  [18-21] 21 (03/28 0800) BP: (118-176)/(40-56) 176/51 (03/28 0800) SpO2:  [95 %-99 %] 97 % (03/28 0756) Weight:  [70.3 kg-72.8 kg] 71.9 kg (03/28 0756)  Weight change: -2.1 kg Filed Weights   12/27/23 1139 12/28/23 0500 12/28/23 0756  Weight: 70.3 kg 72.8 kg 71.9 kg    Intake/Output: I/O last 3 completed shifts: In: 240 [P.O.:240] Out: 1000 [Other:1000]   Intake/Output this shift:  Total I/O In: -  Out: 200 [Urine:200]  Physical Exam: General: NAD  Head: Normocephalic, atraumatic. Moist oral mucosal membranes  Eyes: Anicteric  Lungs:  Diminished., normal effort.   Heart: Regular rate and rhythm  Abdomen:  Soft, nontender  Extremities:  No peripheral edema.  Neurologic: Alert, moving all four extremities  Skin: No lesions  Access: Lt AVF (bruised)     Basic Metabolic Panel: Recent Labs  Lab 12/23/23 0349 12/24/23 0447 12/25/23 0940 12/27/23 0748 12/28/23 0846  NA 139 139 139 136 133*  K 3.4* 3.7 3.3* 3.5 3.9  CL 110  101 105 101 98  CO2 23 22 25 24 25   GLUCOSE 86 124* 91 87 87  BUN 78* 85* 91* 78* 64*  CREATININE 2.44* 2.88* 2.63* 2.41* 2.49*  CALCIUM 10.1 10.0 10.3 8.8* 8.6*  PHOS 2.9 2.7 2.7 2.6 2.4*    Liver Function Tests: Recent Labs  Lab 12/23/23 0349 12/24/23 0447 12/25/23 0940 12/27/23 0748 12/28/23 0846  ALBUMIN 3.0* 2.9* 3.4* 2.8* 2.9*   No results for input(s): "LIPASE", "AMYLASE" in the last 168 hours. No results for input(s): "AMMONIA" in the last 168 hours.  CBC: Recent Labs  Lab 12/22/23 0403 12/23/23 0349 12/25/23 0940 12/27/23 0748 12/28/23 0846  WBC 4.9  5.0 9.8 9.2 8.9 9.7  NEUTROABS 4.0  --   --   --   --   HGB 8.0*  8.0* 8.8* 9.3* 7.9* 8.4*  HCT 25.2*  25.1* 26.7* 29.3* 24.2* 26.0*  MCV 88.7  88.4 86.7 86.7 85.5 86.1  PLT 134*  138* 200 225 219 212    Cardiac Enzymes: No results for input(s): "CKTOTAL", "CKMB", "CKMBINDEX", "TROPONINI" in the last 168 hours.  BNP: Invalid input(s): "POCBNP"  CBG: No results for input(s): "GLUCAP" in the last 168 hours.  Microbiology: Results for orders placed or performed during the hospital encounter of 12/21/23  Resp panel by RT-PCR (RSV, Flu A&B, Covid) Anterior Nasal Swab     Status: None   Collection Time: 12/21/23  3:30 PM   Specimen: Anterior Nasal Swab  Result Value Ref Range Status   SARS Coronavirus 2 by RT PCR NEGATIVE NEGATIVE Final  Comment: (NOTE) SARS-CoV-2 target nucleic acids are NOT DETECTED.  The SARS-CoV-2 RNA is generally detectable in upper respiratory specimens during the acute phase of infection. The lowest concentration of SARS-CoV-2 viral copies this assay can detect is 138 copies/mL. A negative result does not preclude SARS-Cov-2 infection and should not be used as the sole basis for treatment or other patient management decisions. A negative result may occur with  improper specimen collection/handling, submission of specimen other than nasopharyngeal swab, presence of viral  mutation(s) within the areas targeted by this assay, and inadequate number of viral copies(<138 copies/mL). A negative result must be combined with clinical observations, patient history, and epidemiological information. The expected result is Negative.  Fact Sheet for Patients:  BloggerCourse.com  Fact Sheet for Healthcare Providers:  SeriousBroker.it  This test is no t yet approved or cleared by the [place] FDA and  has been authorized for detection and/or diagnosis of SARS-CoV-2 by FDA under an Emergency Use Authorization (EUA). This EUA will remain  in effect (meaning this test can be used) for the duration of the COVID-19 declaration under Section 564(b)(1) of the Act, 21 U.S.C.section 360bbb-3(b)(1), unless the authorization is terminated  or revoked sooner.       Influenza A by PCR NEGATIVE NEGATIVE Final   Influenza B by PCR NEGATIVE NEGATIVE Final    Comment: (NOTE) The Xpert Xpress SARS-CoV-2/FLU/RSV plus assay is intended as an aid in the diagnosis of influenza from Nasopharyngeal swab specimens and should not be used as a sole basis for treatment. Nasal washings and aspirates are unacceptable for Xpert Xpress SARS-CoV-2/FLU/RSV testing.  Fact Sheet for Patients: BloggerCourse.com  Fact Sheet for Healthcare Providers: SeriousBroker.it  This test is not yet approved or cleared by the [place] FDA and has been authorized for detection and/or diagnosis of SARS-CoV-2 by FDA under an Emergency Use Authorization (EUA). This EUA will remain in effect (meaning this test can be used) for the duration of the COVID-19 declaration under Section 564(b)(1) of the Act, 21 U.S.C. section 360bbb-3(b)(1), unless the authorization is terminated or revoked.     Resp Syncytial Virus by PCR NEGATIVE NEGATIVE Final    Comment: (NOTE) Fact Sheet for  Patients: BloggerCourse.com  Fact Sheet for Healthcare Providers: SeriousBroker.it  This test is not yet approved or cleared by the [place] FDA and has been authorized for detection and/or diagnosis of SARS-CoV-2 by FDA under an Emergency Use Authorization (EUA). This EUA will remain in effect (meaning this test can be used) for the duration of the COVID-19 declaration under Section 564(b)(1) of the Act, 21 U.S.C. section 360bbb-3(b)(1), unless the authorization is terminated or revoked.  Performed at [facility], [address] [place], [place] [address]   Blood Culture (routine x 2)     Status: None   Collection Time: 12/21/23  5:19 PM   Specimen: BLOOD  Result Value Ref Range Status   Specimen Description BLOOD BLOOD RIGHT HAND  Final   Special Requests   Final    BOTTLES DRAWN AEROBIC ONLY Blood Culture adequate volume   Culture   Final    NO GROWTH 5 DAYS Performed at [facility], [address] [place], [place] [address]    Report Status 12/26/2023 FINAL  Final  Blood Culture (routine x 2)     Status: None   Collection Time: 12/21/23  5:20 PM   Specimen: BLOOD  Result Value Ref Range Status   Specimen Description BLOOD BLOOD RIGHT ARM  Final  Special Requests   Final    BOTTLES DRAWN AEROBIC AND ANAEROBIC Blood Culture adequate volume   Culture   Final    NO GROWTH 5 DAYS Performed at [facility], [address] [place], [place] [address]    Report Status 12/26/2023 FINAL  Final  Respiratory (~20 pathogens) panel by PCR     Status: None   Collection Time: 12/22/23  2:55 PM   Specimen: Nasopharyngeal Swab; Respiratory  Result Value Ref Range Status   Adenovirus NOT DETECTED NOT DETECTED Final   Coronavirus 229E NOT DETECTED NOT DETECTED Final    Comment: (NOTE) The Coronavirus on the Respiratory Panel, DOES NOT test for the novel  Coronavirus (2019 nCoV)    Coronavirus  HKU1 NOT DETECTED NOT DETECTED Final   Coronavirus NL63 NOT DETECTED NOT DETECTED Final   Coronavirus OC43 NOT DETECTED NOT DETECTED Final   Metapneumovirus NOT DETECTED NOT DETECTED Final   Rhinovirus / Enterovirus NOT DETECTED NOT DETECTED Final   Influenza A NOT DETECTED NOT DETECTED Final   Influenza B NOT DETECTED NOT DETECTED Final   Parainfluenza Virus 1 NOT DETECTED NOT DETECTED Final   Parainfluenza Virus 2 NOT DETECTED NOT DETECTED Final   Parainfluenza Virus 3 NOT DETECTED NOT DETECTED Final   Parainfluenza Virus 4 NOT DETECTED NOT DETECTED Final   Respiratory Syncytial Virus NOT DETECTED NOT DETECTED Final   Bordetella pertussis NOT DETECTED NOT DETECTED Final   Bordetella Parapertussis NOT DETECTED NOT DETECTED Final   Chlamydophila pneumoniae NOT DETECTED NOT DETECTED Final   Mycoplasma pneumoniae NOT DETECTED NOT DETECTED Final    Comment: Performed at [facility], [address], [place], [place] [address]  MRSA Next Gen by PCR, Nasal     Status: None   Collection Time: 12/23/23  9:05 AM   Specimen: Nasal Mucosa; Nasal Swab  Result Value Ref Range Status   MRSA by PCR Next Gen NOT DETECTED NOT DETECTED Final    Comment: (NOTE) The GeneXpert MRSA Assay (FDA approved for NASAL specimens only), is one component of a comprehensive MRSA colonization surveillance program. It is not intended to diagnose MRSA infection nor to guide or monitor treatment for MRSA infections. Test performance is not FDA approved in patients less than 66 years old. Performed at [facility], [address] [place], [place] [address]     Coagulation Studies: No results for input(s): "LABPROT", "INR" in the last 72 hours.  Urinalysis: No results for input(s): "COLORURINE", "LABSPEC", "PHURINE", "GLUCOSEU", "HGBUR", "BILIRUBINUR", "KETONESUR", "PROTEINUR", "UROBILINOGEN", "NITRITE", "LEUKOCYTESUR" in the last 72 hours.  Invalid input(s): "APPERANCEUR"    Imaging: No  results found.   Medications:      amLODipine  5 mg Oral Daily   apixaban  2.5 mg Oral BID   calcium-vitamin D  1 tablet Oral Daily   Chlorhexidine Gluconate Cloth  6 each Topical Q0600   cyanocobalamin  500 mcg Oral Daily   epoetin alfa-epbx (RETACRIT) injection  4,000 Units Intravenous Q M,W,F-1800   fluticasone furoate-vilanterol  1 puff Inhalation Daily   folic acid  1 mg Oral Daily   guaiFENesin  600 mg Oral BID   hydrALAZINE  100 mg Oral TID   ipratropium-albuterol  3 mL Nebulization BID   levothyroxine  150 mcg Oral Q0600   metoprolol tartrate  50 mg Oral BID   montelukast  10 mg Oral QHS   pantoprazole  40 mg Oral Daily   rosuvastatin  10 mg Oral Daily  sertraline  100 mg Oral Daily   acetaminophen **OR** acetaminophen, albuterol, bisacodyl, chlorpheniramine-HYDROcodone, docusate sodium, ondansetron **OR** ondansetron (ZOFRAN) IV, sodium chloride, traZODone  Assessment/ Plan:  Ms. TANASHIA CIESLA is a 83 y.o.  female with past medical conditions including diastolic heart failure, hypertension, hypothyroidism, COPD, and chronic kidney disease stage IV.   End-stage renal disease requiring hemodialysis.  Patient was originally scheduled to be set up outpatient however became symptomatic.   Attempted dialysis today, unable to access fistula. Multiple thrombus from venous. Will allow access to rest and reattempt in am.  Dialysis coordinator notified of desired clinic, [facility], will confirm placement  2.  Acute respiratory failure, workup in progress for community-acquired pneumonia.  Chest x-ray shows vascular shin and trace edema with small pleural effusions, left greater than right.  Primary team has received IV Rocephin and azithromycin.  Respiratory status has improved.   3. Anemia of chronic kidney diseas Lab Results  Component Value Date   HGB 8.4 (L) 12/28/2023    Hemoglobin within optimal range.  ContinueEpogen 4000 units IV with next dialysis.Marland Kitchen  4.  Secondary Hyperparathyroidism: with outpatient labs: PTH 12, phosphorus 6.5, calcium 10 on 12/13/2023.   Lab Results  Component Value Date   CALCIUM 8.6 (L) 12/28/2023   PHOS 2.4 (L) 12/28/2023    Calcium and phosphorus within optimal range.   5.  Hypertension with chronic kidney disease.  Home regimen includes amlodipine, furosemide, hydralazine, and metoprolol.  Furosemide held at this time.   LOS: 7 Eliya Geiman 3/28/202511:26 AM

## 2023-12-28 NOTE — Progress Notes (Signed)
 OT Cancellation Note  Patient Details Name: [name] MRN: [id number] DOB: 11/30/1940   Cancelled Treatment:    Reason Eval/Treat Not Completed: Patient at procedure or test/ unavailable. Pt at dialysis. Will follow up as able.   Arman Filter., MPH, MS, OTR/L ascom [phone] 12/28/23, 8:03 AM

## 2023-12-28 NOTE — Progress Notes (Signed)
 Mobility Specialist - Progress Note  Pre-mobility: HR 61, SpO2 98% During mobility: HR 73, SpO2 93% Post-mobility: SPO2 95%   12/28/23 1110  Mobility  Activity Ambulated with assistance in hallway  Level of Assistance Standby assist, set-up cues, supervision of patient - no hands on  Assistive Device Front wheel walker  Distance Ambulated (ft) 200 ft  Activity Response Tolerated well  Mobility visit 1 Mobility  Mobility Specialist Start Time (ACUTE ONLY) 1055  Mobility Specialist Stop Time (ACUTE ONLY) 1106  Mobility Specialist Time Calculation (min) (ACUTE ONLY) 11 min   Pt sitting in the recliner upon entry, utilizing 2L [place]. Pt motivated and agreeable to amb this date, denied pain. Pt STS to RW ModI and amb ~200 ft in the hallway with supervision, tolerated well. Pt returned to the room, left seated in the recliner with needs within reach. RN and student present at bedside.  Zetta Bills Mobility Specialist 12/28/23 11:15 AM

## 2023-12-29 DIAGNOSIS — J189 Pneumonia, unspecified organism: Secondary | ICD-10-CM | POA: Diagnosis not present

## 2023-12-29 LAB — CBC
HCT: 27.5 % — ABNORMAL LOW (ref 36.0–46.0)
Hemoglobin: 8.6 g/dL — ABNORMAL LOW (ref 12.0–15.0)
MCH: 27.8 pg (ref 26.0–34.0)
MCHC: 31.3 g/dL (ref 30.0–36.0)
MCV: 89 fL (ref 80.0–100.0)
Platelets: 231 10*3/uL (ref 150–400)
RBC: 3.09 MIL/uL — ABNORMAL LOW (ref 3.87–5.11)
RDW: 16.4 % — ABNORMAL HIGH (ref 11.5–15.5)
WBC: 9 10*3/uL (ref 4.0–10.5)
nRBC: 0 % (ref 0.0–0.2)

## 2023-12-29 LAB — RENAL FUNCTION PANEL
Albumin: 3.1 g/dL — ABNORMAL LOW (ref 3.5–5.0)
Anion gap: 10 (ref 5–15)
BUN: 72 mg/dL — ABNORMAL HIGH (ref 8–23)
CO2: 24 mmol/L (ref 22–32)
Calcium: 9.2 mg/dL (ref 8.9–10.3)
Chloride: 99 mmol/L (ref 98–111)
Creatinine, Ser: 2.42 mg/dL — ABNORMAL HIGH (ref 0.44–1.00)
GFR, Estimated: 19 mL/min — ABNORMAL LOW (ref >60)
Glucose, Bld: 89 mg/dL (ref 70–99)
Phosphorus: 2.4 mg/dL — ABNORMAL LOW (ref 2.5–4.6)
Potassium: 4.1 mmol/L (ref 3.5–5.1)
Sodium: 133 mmol/L — ABNORMAL LOW (ref 135–145)

## 2023-12-29 MED ORDER — PENTAFLUOROPROP-TETRAFLUOROETH EX AERO
INHALATION_SPRAY | CUTANEOUS | Status: AC
  Filled 2023-12-29: qty 30

## 2023-12-29 NOTE — Progress Notes (Signed)
 This RN placed nurse driven order for IV insertion d/t poor vasculature. Barkley Bruns, RN IV Nurse deferred IV placement and Dr. Fran Lowes said okay for patient to have IV.

## 2023-12-29 NOTE — Progress Notes (Addendum)
 Hemodialysis Note:  Received patient in bed to unit. Alert and oriented. Informed consent singed and in chart.  Access used: Left Fistula Access issues: Unable to dialyze the patient due to venous clots. Nephrologist and NP HD notified.  Transported back to room, alert without acute distress. Report given to patient's RN.   [facility]

## 2023-12-29 NOTE — Plan of Care (Signed)

## 2023-12-29 NOTE — Plan of Care (Signed)

## 2023-12-29 NOTE — Progress Notes (Signed)
 PROGRESS NOTE    [name]  ZOX:[id number] DOB: Oct 25, 1940 DOA: 12/21/2023 PCP: Patient, No Pcp Per  233A/233A-AA  LOS: 8 days   Brief hospital course:   Assessment & Plan: [name] is a 83 y.o. female with medical history significant for asthma, osteoarthritis CHF, type 2 diabetes mellitus, GERD, hypertension, dyslipidemia, hypothyroidism, OSA, stage IV chronic kidney disease, and hypothyroidism who presented to the emergency room with productive cough over the last few days with associated chest congestion, fever, as well as chills.  She had COVID about a month ago.  She was noted to be mildly hypoxic with pulse oximeter reading 90% on room air with EMS.  This improved to 93% on 4 L of O2 by nasal cannula.  She is currently awaiting initiation of hemodialysis, but makes urine.    * CAP (community acquired pneumonia) Patient presented with productive cough and fever. She had new oxygen requirement and was afebrile at the hospital. She had no leukocytosis with elevated procalcitonin. CXR with no focal consolidation but sm pleural effusions and vascular congestion.  Completed antibiotics course.   COPD exacerbation (HCC) Patient with recent PFTs, with FEV1/FVC ratio normal, FEV1 and FVC normal. She had no significant bronchodilator response. She did however note improvement in her symptoms with broncho dilators. Her pulmonologist felt she could have mild obstructive airway disease.  --completed steroid burst --cont bronchodilator and DuoNeb   Acute respiratory failure with hypoxia (HCC) Likely secondary to above. Possible component of exacerbation of HFpEF as well (BNP elevated and noted to have interstitial edema/vascular congestion on admission CXR) --weaned down to RA.   Acute on chronic HFpEF exacerbation  Clinical and laboratory evidence of volume overload. Continue antihypertensives.  Patient had pretty much normal echocardiogram 6 months ago. -Holding Lasix due to  increasing creatinine --HD for volume removal   CKD stage 4 now ESRD Due to worsening creatinine despite holding Lasix nephrology was consulted and patient is being started on dialysis, she already had a fistula in place in the preparation as outpatient. --need to confirm outpatient dialysis placement  AV fistula malfunction Unable to dialyze today due to venous infiltrate --vascular consult placed by nephro   Normocytic anemia  Thrombocytopenia -mild  No obvious evidence of bleeding.  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  Also mild thrombocytopenia. -Anemia panel with borderline folic acid -Patient get Aranesp at home --cont folic acid suppl (new)   Hypokalemia --correct with dialysis   Essential hypertension --cont amlodipine, hydralazine, Lopressor   Depression Continue home Zoloft.   GERD without esophagitis --cont PPI   Paroxysmal atrial fibrillation (HCC) In sinus rhythm.   --cont lopressor and Eliquis   Dyslipidemia Continue statin therapy   Hypothyroidism Continue home Synthroid.   DVT prophylaxis: [id number] Code Status: DNR  Family Communication: husband updated at bedside today Level of care: Telemetry Cardiac Dispo:   The patient is from: home Anticipated d/c is to: home Anticipated d/c date is: 1-2 days   Subjective and Interval History:  Pt reported doing ok.  Attempted dialysis today, unable due to venous infiltrate.    Objective: Vitals:   12/29/23 0900 12/29/23 1033 12/29/23 1240 12/29/23 1519  BP: (!) 154/44  111/84 (!) 123/45  Pulse:  (!) 55 60 62  Resp: 20  18 18   Temp:   98 F (36.7 C) 97.7 F (36.5 C)  TempSrc:   Oral Oral  SpO2: 96%  97% 93%  Weight:      Height:  Intake/Output Summary (Last 24 hours) at 12/29/2023 1745 Last data filed at 12/29/2023 1500 Gross per 24 hour  Intake --  Output 1750 ml  Net -1750 ml   Filed Weights   12/28/23 0756 12/29/23 0531 12/29/23 0800  Weight: 71.9 kg 72.8 kg 72.1 kg     Examination:   Constitutional: NAD, AAOx3 HEENT: conjunctivae and lids normal, EOMI CV: No cyanosis.   RESP: normal respiratory effort Neuro: II - XII grossly intact.   Psych: Normal mood and affect.  Appropriate judgement and reason   Data Reviewed: I have personally reviewed labs and imaging studies  Time spent: 35 minutes  Darlin Priestly, MD [facility] If 7PM-7AM, please contact night-coverage 12/29/2023, 5:45 PM

## 2023-12-29 NOTE — Progress Notes (Signed)
 [facility]  ROUNDING NOTE   Subjective:   [name] is a 83 y.o. female with past medical conditions including diastolic heart failure, hypertension, hypothyroidism, COPD, and chronic kidney disease stage IV. Patient presents to ED with shortness of breath and has been admitted for Hypoxia [R09.02] COPD exacerbation (HCC) [J44.1] CAP (community acquired pneumonia) [J18.9]  Patient is known to our practice and receives outpatient follow-up with Dr. Cherylann Ratel.  Patient was initially scheduled to begin outpatient dialysis treatments and was awaiting confirmation from outpatient clinic, [facility].    Patient seen sitting up in bed Alert Unable to perform dialysis today due to venous infiltrate Patient complains of sore left arm   Objective:  Vital signs in last 24 hours:  Temp:  [97.1 F (36.2 C)-98 F (36.7 C)] 98 F (36.7 C) (03/29 1240) Pulse Rate:  [51-60] 60 (03/29 1240) Resp:  [16-26] 18 (03/29 1240) BP: (111-165)/(41-84) 111/84 (03/29 1240) SpO2:  [94 %-97 %] 97 % (03/29 1240) Weight:  [72.1 kg-72.8 kg] 72.1 kg (03/29 0800)  Weight change: 0.5 kg Filed Weights   12/28/23 0756 12/29/23 0531 12/29/23 0800  Weight: 71.9 kg 72.8 kg 72.1 kg    Intake/Output: I/O last 3 completed shifts: In: 480 [P.O.:480] Out: 1650 [Urine:1650]   Intake/Output this shift:  No intake/output data recorded.  Physical Exam: General: NAD  Head: Normocephalic, atraumatic. Moist oral mucosal membranes  Eyes: Anicteric  Lungs:  Diminished., normal effort.   Heart: Regular rate and rhythm  Abdomen:  Soft, nontender  Extremities:  No peripheral edema.  Neurologic: Alert, moving all four extremities  Skin: No lesions  Access: Lt AVF (bruised)     Basic Metabolic Panel: Recent Labs  Lab 12/24/23 0447 12/25/23 0940 12/27/23 0748 12/28/23 0846 12/29/23 0852  NA 139 139 136 133* 133*  K 3.7 3.3* 3.5 3.9 4.1  CL 101 105 101 98 99  CO2 22 25 24 25 24   GLUCOSE  124* 91 87 87 89  BUN 85* 91* 78* 64* 72*  CREATININE 2.88* 2.63* 2.41* 2.49* 2.42*  CALCIUM 10.0 10.3 8.8* 8.6* 9.2  PHOS 2.7 2.7 2.6 2.4* 2.4*    Liver Function Tests: Recent Labs  Lab 12/24/23 0447 12/25/23 0940 12/27/23 0748 12/28/23 0846 12/29/23 0852  ALBUMIN 2.9* 3.4* 2.8* 2.9* 3.1*   No results for input(s): "LIPASE", "AMYLASE" in the last 168 hours. No results for input(s): "AMMONIA" in the last 168 hours.  CBC: Recent Labs  Lab 12/23/23 0349 12/25/23 0940 12/27/23 0748 12/28/23 0846 12/29/23 0852  WBC 9.8 9.2 8.9 9.7 9.0  HGB 8.8* 9.3* 7.9* 8.4* 8.6*  HCT 26.7* 29.3* 24.2* 26.0* 27.5*  MCV 86.7 86.7 85.5 86.1 89.0  PLT 200 225 219 212 231    Cardiac Enzymes: No results for input(s): "CKTOTAL", "CKMB", "CKMBINDEX", "TROPONINI" in the last 168 hours.  BNP: Invalid input(s): "POCBNP"  CBG: No results for input(s): "GLUCAP" in the last 168 hours.  Microbiology: Results for orders placed or performed during the hospital encounter of 12/21/23  Resp panel by RT-PCR (RSV, Flu A&B, Covid) Anterior Nasal Swab     Status: None   Collection Time: 12/21/23  3:30 PM   Specimen: Anterior Nasal Swab  Result Value Ref Range Status   SARS Coronavirus 2 by RT PCR NEGATIVE NEGATIVE Final    Comment: (NOTE) SARS-CoV-2 target nucleic acids are NOT DETECTED.  The SARS-CoV-2 RNA is generally detectable in upper respiratory specimens during the acute phase of infection. The lowest concentration  of SARS-CoV-2 viral copies this assay can detect is 138 copies/mL. A negative result does not preclude SARS-Cov-2 infection and should not be used as the sole basis for treatment or other patient management decisions. A negative result may occur with  improper specimen collection/handling, submission of specimen other than nasopharyngeal swab, presence of viral mutation(s) within the areas targeted by this assay, and inadequate number of viral copies(<138 copies/mL). A negative  result must be combined with clinical observations, patient history, and epidemiological information. The expected result is Negative.  Fact Sheet for Patients:  BloggerCourse.com  Fact Sheet for Healthcare Providers:  SeriousBroker.it  This test is no t yet approved or cleared by the [place] FDA and  has been authorized for detection and/or diagnosis of SARS-CoV-2 by FDA under an Emergency Use Authorization (EUA). This EUA will remain  in effect (meaning this test can be used) for the duration of the COVID-19 declaration under Section 564(b)(1) of the Act, 21 U.S.C.section 360bbb-3(b)(1), unless the authorization is terminated  or revoked sooner.       Influenza A by PCR NEGATIVE NEGATIVE Final   Influenza B by PCR NEGATIVE NEGATIVE Final    Comment: (NOTE) The Xpert Xpress SARS-CoV-2/FLU/RSV plus assay is intended as an aid in the diagnosis of influenza from Nasopharyngeal swab specimens and should not be used as a sole basis for treatment. Nasal washings and aspirates are unacceptable for Xpert Xpress SARS-CoV-2/FLU/RSV testing.  Fact Sheet for Patients: BloggerCourse.com  Fact Sheet for Healthcare Providers: SeriousBroker.it  This test is not yet approved or cleared by the [place] FDA and has been authorized for detection and/or diagnosis of SARS-CoV-2 by FDA under an Emergency Use Authorization (EUA). This EUA will remain in effect (meaning this test can be used) for the duration of the COVID-19 declaration under Section 564(b)(1) of the Act, 21 U.S.C. section 360bbb-3(b)(1), unless the authorization is terminated or revoked.     Resp Syncytial Virus by PCR NEGATIVE NEGATIVE Final    Comment: (NOTE) Fact Sheet for Patients: BloggerCourse.com  Fact Sheet for Healthcare Providers: SeriousBroker.it  This  test is not yet approved or cleared by the [place] FDA and has been authorized for detection and/or diagnosis of SARS-CoV-2 by FDA under an Emergency Use Authorization (EUA). This EUA will remain in effect (meaning this test can be used) for the duration of the COVID-19 declaration under Section 564(b)(1) of the Act, 21 U.S.C. section 360bbb-3(b)(1), unless the authorization is terminated or revoked.  Performed at [facility], [address] [place], [place] [address]   Blood Culture (routine x 2)     Status: None   Collection Time: 12/21/23  5:19 PM   Specimen: BLOOD  Result Value Ref Range Status   Specimen Description BLOOD BLOOD RIGHT HAND  Final   Special Requests   Final    BOTTLES DRAWN AEROBIC ONLY Blood Culture adequate volume   Culture   Final    NO GROWTH 5 DAYS Performed at [facility], [address], [place], [place] [address]    Report Status 12/26/2023 FINAL  Final  Blood Culture (routine x 2)     Status: None   Collection Time: 12/21/23  5:20 PM   Specimen: BLOOD  Result Value Ref Range Status   Specimen Description BLOOD BLOOD RIGHT ARM  Final   Special Requests   Final    BOTTLES DRAWN AEROBIC AND ANAEROBIC Blood Culture adequate volume   Culture   Final    NO  GROWTH 5 DAYS Performed at [facility], [address] [place], [place] [address]    Report Status 12/26/2023 FINAL  Final  Respiratory (~20 pathogens) panel by PCR     Status: None   Collection Time: 12/22/23  2:55 PM   Specimen: Nasopharyngeal Swab; Respiratory  Result Value Ref Range Status   Adenovirus NOT DETECTED NOT DETECTED Final   Coronavirus 229E NOT DETECTED NOT DETECTED Final    Comment: (NOTE) The Coronavirus on the Respiratory Panel, DOES NOT test for the novel  Coronavirus (2019 nCoV)    Coronavirus HKU1 NOT DETECTED NOT DETECTED Final   Coronavirus NL63 NOT DETECTED NOT DETECTED Final   Coronavirus OC43 NOT DETECTED NOT DETECTED Final    Metapneumovirus NOT DETECTED NOT DETECTED Final   Rhinovirus / Enterovirus NOT DETECTED NOT DETECTED Final   Influenza A NOT DETECTED NOT DETECTED Final   Influenza B NOT DETECTED NOT DETECTED Final   Parainfluenza Virus 1 NOT DETECTED NOT DETECTED Final   Parainfluenza Virus 2 NOT DETECTED NOT DETECTED Final   Parainfluenza Virus 3 NOT DETECTED NOT DETECTED Final   Parainfluenza Virus 4 NOT DETECTED NOT DETECTED Final   Respiratory Syncytial Virus NOT DETECTED NOT DETECTED Final   Bordetella pertussis NOT DETECTED NOT DETECTED Final   Bordetella Parapertussis NOT DETECTED NOT DETECTED Final   Chlamydophila pneumoniae NOT DETECTED NOT DETECTED Final   Mycoplasma pneumoniae NOT DETECTED NOT DETECTED Final    Comment: Performed at [facility], [address] [place], [place] [address]  MRSA Next Gen by PCR, Nasal     Status: None   Collection Time: 12/23/23  9:05 AM   Specimen: Nasal Mucosa; Nasal Swab  Result Value Ref Range Status   MRSA by PCR Next Gen NOT DETECTED NOT DETECTED Final    Comment: (NOTE) The GeneXpert MRSA Assay (FDA approved for NASAL specimens only), is one component of a comprehensive MRSA colonization surveillance program. It is not intended to diagnose MRSA infection nor to guide or monitor treatment for MRSA infections. Test performance is not FDA approved in patients less than 55 years old. Performed at [facility], [address] [place], [place] [address]     Coagulation Studies: No results for input(s): "LABPROT", "INR" in the last 72 hours.  Urinalysis: No results for input(s): "COLORURINE", "LABSPEC", "PHURINE", "GLUCOSEU", "HGBUR", "BILIRUBINUR", "KETONESUR", "PROTEINUR", "UROBILINOGEN", "NITRITE", "LEUKOCYTESUR" in the last 72 hours.  Invalid input(s): "APPERANCEUR"    Imaging: No results found.   Medications:      amLODipine  5 mg Oral Daily   apixaban  2.5 mg Oral BID   calcium-vitamin D  1 tablet Oral Daily    Chlorhexidine Gluconate Cloth  6 each Topical Q0600   cyanocobalamin  500 mcg Oral Daily   epoetin alfa-epbx (RETACRIT) injection  4,000 Units Intravenous Q M,W,F-1800   fluticasone furoate-vilanterol  1 puff Inhalation Daily   folic acid  1 mg Oral Daily   guaiFENesin  600 mg Oral BID   hydrALAZINE  100 mg Oral TID   ipratropium-albuterol  3 mL Nebulization BID   levothyroxine  150 mcg Oral Q0600   metoprolol tartrate  50 mg Oral BID   montelukast  10 mg Oral QHS   pantoprazole  40 mg Oral Daily   rosuvastatin  10 mg Oral Daily   sertraline  100 mg Oral Daily   acetaminophen **OR** acetaminophen, albuterol, bisacodyl, chlorpheniramine-HYDROcodone, docusate sodium, ondansetron **OR** ondansetron (ZOFRAN) IV, sodium chloride, traZODone  Assessment/ Plan:  [name] is a 83 y.o.  female with past medical conditions including diastolic heart failure, hypertension, hypothyroidism, COPD, and chronic kidney disease stage IV.   End-stage renal disease requiring hemodialysis.  Patient was originally scheduled to be set up outpatient however became symptomatic.   Attempted dialysis today, unable due to venous infiltrate. Will have vascular evaluate fistula and determine intervention.  Dialysis coordinator notified of desired clinic, [facility], will confirm placement  2.  Acute respiratory failure, workup in progress for community-acquired pneumonia.  Chest x-ray shows vascular shin and trace edema with small pleural effusions, left greater than right.  Primary team has received IV Rocephin and azithromycin.  Respiratory status improved and patient remains on room air.   3. Anemia of chronic kidney diseas Lab Results  Component Value Date   HGB 8.6 (L) 12/29/2023    Hemoglobin within optimal range.  Continue low dose Epogen with next dialysis.Marland Kitchen  4. Secondary Hyperparathyroidism: with outpatient labs: PTH 12, phosphorus 6.5, calcium 10 on 12/13/2023.   Lab Results  Component  Value Date   CALCIUM 9.2 12/29/2023   PHOS 2.4 (L) 12/29/2023    Calcium and phosphorus within optimal range.   5.  Hypertension with chronic kidney disease.  Home regimen includes amlodipine, furosemide, hydralazine, and metoprolol.  Furosemide held.   LOS: 8 [name] 3/29/20251:22 PM

## 2023-12-30 DIAGNOSIS — J189 Pneumonia, unspecified organism: Secondary | ICD-10-CM | POA: Diagnosis not present

## 2023-12-30 DIAGNOSIS — N186 End stage renal disease: Secondary | ICD-10-CM | POA: Diagnosis not present

## 2023-12-30 DIAGNOSIS — Z992 Dependence on renal dialysis: Secondary | ICD-10-CM | POA: Diagnosis not present

## 2023-12-30 MED ORDER — ACETAMINOPHEN 500 MG PO TABS
1000.0000 mg | ORAL_TABLET | Freq: Three times a day (TID) | ORAL | Status: DC | PRN
  Administered 2023-12-30 – 2024-01-01 (×4): 1000 mg via ORAL
  Filled 2023-12-30 (×4): qty 2

## 2023-12-30 NOTE — Consult Note (Signed)
 [facility]  Patient name: [name] MRN: [id number] DOB: May 17, 1941 Sex: female   REQUESTING PROVIDER:    renal   REASON FOR CONSULT:    Fistula infiltrate  HISTORY OF PRESENT ILLNESS:   [name] is a 83 y.o. female, who is status post left brachiocephalic fistula by Dr. Gilda Crease on 05/19/2022.  He last saw her in the beginning of March 2025 for a right leg wound which has been healing.  This was secondary to a car door injury.  Her fistula had a mild stenosis.  Flow volumes were 1544 cc/min.  The patient recently started hemodialysis.  She has had several infiltrates.  PAST MEDICAL HISTORY    Past Medical History:  Diagnosis Date   Actinic keratosis    Albuminuria    Anemia    Arthritis    Asthma    Basal cell carcinoma 04/12/2017   Above right lateral brow. Nodulocystic type. EDC   Cancer (HCC)    skin   Cataract cortical, senile    CHF (congestive heart failure) (HCC)    Diabetes mellitus without complication (HCC)    GERD (gastroesophageal reflux disease)    Hemorrhoids    History of kidney stones    Hyperlipidemia    Hypertension    Hypothyroidism    Lyme disease    No kidney function    OSA (obstructive sleep apnea)    Osteoporosis    Osteoporosis    Reflux esophagitis    Steatohepatitis    Steatohepatitis      FAMILY HISTORY   Family History  Problem Relation Age of Onset   Breast cancer Mother    Heart attack Father     SOCIAL HISTORY:   Social History   Socioeconomic History   Marital status: Married    Spouse name: Not on file   Number of children: Not on file   Years of education: Not on file   Highest education level: Associate degree: academic program  Occupational History   Not on file  Tobacco Use   Smoking status: Never   Smokeless tobacco: Never  Vaping Use   Vaping status: Never Used  Substance and Sexual Activity   Alcohol use: No   Drug use: No    Sexual activity: Not on file  Other Topics Concern   Not on file  Social History Narrative   Lives at home with husband .   Social Drivers of Corporate investment banker Strain: Low Risk  (06/12/2023)   Overall Financial Resource Strain (CARDIA)    Difficulty of Paying Living Expenses: Not hard at all  Food Insecurity: No Food Insecurity (12/22/2023)   Hunger Vital Sign    Worried About Running Out of Food in the Last Year: Never true    Ran Out of Food in the Last Year: Never true  Transportation Needs: No Transportation Needs (12/22/2023)   PRAPARE - Administrator, Civil Service (Medical): No    Lack of Transportation (Non-Medical): No  Physical Activity: Insufficiently Active (06/12/2023)   Exercise Vital Sign    Days of Exercise per Week: 7 days    Minutes of Exercise per Session: 20 min  Stress: No Stress Concern Present (06/12/2023)   [employer] of Occupational Health - Occupational Stress Questionnaire    Feeling of Stress : Only a little  Social Connections: Moderately Isolated (12/22/2023)   Social Connection and Isolation Panel [NHANES]    Frequency of Communication  with Friends and Family: More than three times a week    Frequency of Social Gatherings with Friends and Family: More than three times a week    Attends Religious Services: Never    Database administrator or Organizations: No    Attends Banker Meetings: Never    Marital Status: Married  Catering manager Violence: Not At Risk (12/22/2023)   Humiliation, Afraid, Rape, and Kick questionnaire    Fear of Current or Ex-Partner: No    Emotionally Abused: No    Physically Abused: No    Sexually Abused: No    ALLERGIES:    Allergies  Allergen Reactions   Ace Inhibitors     Other reaction(s): Unknown   Egg-Derived Products Diarrhea   Other     Other reaction(s): Other (See Comments) Eggs   Prednisone     Other reaction(s): Other (See Comments) joint pain   Risedronate      Other reaction(s): Other (See Comments)   Sulfa Antibiotics Itching and Swelling    Other reaction(s): Other (See Comments)   Sulfasalazine Other (See Comments)    CURRENT MEDICATIONS:    Current Facility-Administered Medications  Medication Dose Route Frequency Provider Last Rate Last Admin   acetaminophen (TYLENOL) tablet 650 mg  650 mg Oral Q6H PRN Mansy, Jan A, MD   650 mg at 12/23/23 2207   Or   acetaminophen (TYLENOL) suppository 650 mg  650 mg Rectal Q6H PRN Mansy, Jan A, MD       albuterol (PROVENTIL) (2.5 MG/3ML) 0.083% nebulizer solution 2.5 mg  2.5 mg Nebulization Q2H PRN Arnetha Courser, MD   2.5 mg at 12/25/23 0306   amLODipine (NORVASC) tablet 5 mg  5 mg Oral Daily Mansy, Jan A, MD   5 mg at 12/30/23 1610   apixaban (ELIQUIS) tablet 2.5 mg  2.5 mg Oral BID Mansy, Jan A, MD   2.5 mg at 12/30/23 0950   bisacodyl (DULCOLAX) suppository 10 mg  10 mg Rectal Daily PRN Manuela Schwartz, NP       calcium-vitamin D (OSCAL WITH D) 500-5 MG-MCG per tablet 1 tablet  1 tablet Oral Daily Mansy, Jan A, MD   1 tablet at 12/30/23 9604   Chlorhexidine Gluconate Cloth 2 % PADS 6 each  6 each Topical Q0600 Wendee Beavers, NP   6 each at 12/30/23 0501   chlorpheniramine-HYDROcodone (TUSSIONEX) 10-8 MG/5ML suspension 5 mL  5 mL Oral Q12H PRN Mansy, Jan A, MD       cyanocobalamin (VITAMIN B12) tablet 500 mcg  500 mcg Oral Daily Mansy, Jan A, MD   500 mcg at 12/30/23 5409   docusate sodium (COLACE) capsule 100 mg  100 mg Oral Daily PRN Manuela Schwartz, NP   100 mg at 12/27/23 2125   epoetin alfa-epbx (RETACRIT) injection 4,000 Units  4,000 Units Intravenous Q M,W,F-1800 Breeze, Gery Pray, NP   4,000 Units at 12/28/23 1904   fluticasone furoate-vilanterol (BREO ELLIPTA) 100-25 MCG/ACT 1 puff  1 puff Inhalation Daily Marolyn Haller, MD   1 puff at 12/30/23 0954   folic acid (FOLVITE) tablet 1 mg  1 mg Oral Daily Arnetha Courser, MD   1 mg at 12/30/23 0950   guaiFENesin (MUCINEX) 12 hr tablet 600 mg   600 mg Oral BID Mansy, Jan A, MD   600 mg at 12/30/23 0950   hydrALAZINE (APRESOLINE) tablet 100 mg  100 mg Oral TID Mansy, Jan A, MD   100 mg at 12/30/23 [phone]  levothyroxine (SYNTHROID) tablet 150 mcg  150 mcg Oral Q0600 Otelia Sergeant, RPH   150 mcg at 12/30/23 1610   metoprolol tartrate (LOPRESSOR) tablet 50 mg  50 mg Oral BID Mansy, Jan A, MD   50 mg at 12/30/23 0951   montelukast (SINGULAIR) tablet 10 mg  10 mg Oral QHS Mansy, Jan A, MD   10 mg at 12/29/23 2116   ondansetron [facility]) tablet 4 mg  4 mg Oral Q6H PRN Mansy, Jan A, MD       Or   ondansetron [facility]) injection 4 mg  4 mg Intravenous Q6H PRN Mansy, Jan A, MD       pantoprazole (PROTONIX) EC tablet 40 mg  40 mg Oral Daily Mansy, Jan A, MD   40 mg at 12/30/23 9604   rosuvastatin (CRESTOR) tablet 10 mg  10 mg Oral Daily Wendee Beavers, NP   10 mg at 12/30/23 5409   sertraline (ZOLOFT) tablet 100 mg  100 mg Oral Daily Mansy, Jan A, MD   100 mg at 12/30/23 8119   sodium chloride (OCEAN) 0.65 % nasal spray 1 spray  1 spray Each Nare PRN Arnetha Courser, MD   1 spray at 12/23/23 2205   traZODone (DESYREL) tablet 25 mg  25 mg Oral QHS PRN Mansy, Jan A, MD   25 mg at 12/26/23 2138    REVIEW OF SYSTEMS:   [X]  denotes positive finding, [ ]  denotes negative finding Cardiac  Comments:  Chest pain or chest pressure:    Shortness of breath upon exertion:    Short of breath when lying flat:    Irregular heart rhythm:        Vascular    Pain in calf, thigh, or hip brought on by ambulation:    Pain in feet at night that wakes you up from your sleep:     Blood clot in your veins:    Leg swelling:         Pulmonary    Oxygen at home:    Productive cough:     Wheezing:         Neurologic    Sudden weakness in arms or legs:     Sudden numbness in arms or legs:     Sudden onset of difficulty speaking or slurred speech:    Temporary loss of vision in one eye:     Problems with dizziness:         Gastrointestinal    Blood in  stool:      Vomited blood:         Genitourinary    Burning when urinating:     Blood in urine:        Psychiatric    Major depression:         Hematologic    Bleeding problems:    Problems with blood clotting too easily:        Skin    Rashes or ulcers:        Constitutional    Fever or chills:     PHYSICAL EXAM:   Vitals:   12/29/23 2311 12/30/23 0414 12/30/23 0500 12/30/23 0819  BP: (!) 139/48 (!) 142/51  (!) 157/47  Pulse: 63 62  61  Resp: 16 17  18   Temp: 98 F (36.7 C) 97.7 F (36.5 C)  97.7 F (36.5 C)  TempSrc: Oral Oral  Oral  SpO2: 97% 98%  95%  Weight:   76.6 kg   Height:  GENERAL: The patient is a well-nourished female, in no acute distress. The vital signs are documented above. CARDIAC: There is a regular rate and rhythm.  VASCULAR: Palpable thrill within the left brachiocephalic fistula which is very tender with surrounding ecchymosis PULMONARY: Nonlabored respirations MUSCULOSKELETAL: There are no major deformities or cyanosis. NEUROLOGIC: No focal weakness or paresthesias are detected. SKIN: There are no ulcers or rashes noted. PSYCHIATRIC: The patient has a normal affect.  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  She has had attempts at cannulation of her fistula in the outpatient setting which were unsuccessful.  She now has significant ecchymosis and tenderness on her arm.  There is a known stenosis from duplex of her fistula.  The neck step is to proceed with left brachiocephalic fistulogram.  Simultaneously we will place a tunneled dialysis catheter to let her arm rest.  She will need to be n.p.o. after midnight with plans for procedure tomorrow on Monday.   Charlena Cross, MD, FACS [facility] [phone] Pager [phone]

## 2023-12-30 NOTE — Progress Notes (Signed)
 PROGRESS NOTE    YUETTE PUTNAM  YNW:[id number] DOB: 04/27/1941 DOA: 12/21/2023 PCP: Patient, No Pcp Per  [id number]  LOS: 9 days   Brief hospital course:   Assessment & Plan: [name] is a 83 y.o. female with medical history significant for asthma, osteoarthritis CHF, type 2 diabetes mellitus, GERD, hypertension, dyslipidemia, hypothyroidism, OSA, stage IV chronic kidney disease, and hypothyroidism who presented to the emergency room with productive cough over the last few days with associated chest congestion, fever, as well as chills.  She had COVID about a month ago.  She was noted to be mildly hypoxic with pulse oximeter reading 90% on room air with EMS.  This improved to 93% on 4 L of O2 by nasal cannula.  She is currently awaiting initiation of hemodialysis, but makes urine.    * CAP (community acquired pneumonia) Patient presented with productive cough and fever. She had new oxygen requirement and was afebrile at the hospital. She had no leukocytosis with elevated procalcitonin. CXR with no focal consolidation but sm pleural effusions and vascular congestion.  Completed antibiotics course.   COPD exacerbation (HCC) Patient with recent PFTs, with FEV1/FVC ratio normal, FEV1 and FVC normal. She had no significant bronchodilator response. She did however note improvement in her symptoms with broncho dilators. Her pulmonologist felt she could have mild obstructive airway disease.  --completed steroid burst --cont bronchodilator and DuoNeb   Acute respiratory failure with hypoxia (HCC) Likely secondary to above. Possible component of exacerbation of HFpEF as well (BNP elevated and noted to have interstitial edema/vascular congestion on admission CXR) --weaned down to RA.   Acute on chronic HFpEF exacerbation  Clinical and laboratory evidence of volume overload. Continue antihypertensives.  Patient had pretty much normal echocardiogram 6 months ago. -Holding Lasix due to  increasing creatinine --HD for volume removal   CKD stage 4 now ESRD Due to worsening creatinine despite holding Lasix nephrology was consulted and patient is being started on dialysis, she already had a fistula in place in the preparation as outpatient. --need to confirm outpatient dialysis placement  AV fistula malfunction Unable to dialyze today due to venous infiltrate --vascular intervention tomorrow   Normocytic anemia  Thrombocytopenia -mild  No obvious evidence of bleeding.  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  Also mild thrombocytopenia. -Anemia panel with borderline folic acid -Patient get Aranesp at home --cont folic acid suppl (new)   Hypokalemia --correct with dialysis   Essential hypertension --cont amlodipine, hydralazine, Lopressor   Depression --cont zoloft   GERD without esophagitis --cont PPI   Paroxysmal atrial fibrillation (HCC) In sinus rhythm.   --cont lopressor and Eliquis   Dyslipidemia Continue statin therapy   Hypothyroidism --cont Synthroid   DVT prophylaxis: MV:HQIONGE Code Status: DNR  Family Communication: husband updated at bedside today Level of care: Telemetry Cardiac Dispo:   The patient is from: home Anticipated d/c is to: home Anticipated d/c date is: whenever outpatient dialysis is set up   Subjective and Interval History:  Pt reported her fistula hurt.   Objective: Vitals:   12/30/23 0819 12/30/23 1209 12/30/23 1526 12/30/23 1559  BP: (!) 157/47 (!) 138/42 (!) 117/33 (!) 122/46  Pulse: 61 (!) 56 (!) 57 (!) 55  Resp: 18 17 18    Temp: 97.7 F (36.5 C) 98.4 F (36.9 C) 97.7 F (36.5 C)   TempSrc: Oral Oral Oral   SpO2: 95% 97% 96%   Weight:      Height:  Intake/Output Summary (Last 24 hours) at 12/30/2023 1646 Last data filed at 12/30/2023 0900 Gross per 24 hour  Intake 600 ml  Output 1000 ml  Net -400 ml   Filed Weights   12/29/23 0531 12/29/23 0800 12/30/23 0500  Weight: 72.8 kg 72.1 kg 76.6  kg    Examination:   Constitutional: NAD, AAOx3 HEENT: conjunctivae and lids normal, EOMI CV: No cyanosis.   RESP: normal respiratory effort, on RA Extremities: bruising in her LUE where the fistula is SKIN: warm, dry Neuro: II - XII grossly intact.   Psych: Normal mood and affect.  Appropriate judgement and reason   Data Reviewed: I have personally reviewed labs and imaging studies  Time spent: 35 minutes  Darlin Priestly, MD [facility] If 7PM-7AM, please contact night-coverage 12/30/2023, 4:46 PM

## 2023-12-30 NOTE — Progress Notes (Signed)
 [facility]  ROUNDING NOTE   Subjective:   [name] is a 83 y.o. female with past medical conditions including diastolic heart failure, hypertension, hypothyroidism, COPD, and chronic kidney disease stage IV. Patient presents to ED with shortness of breath and has been admitted for Hypoxia [R09.02] COPD exacerbation (HCC) [J44.1] CAP (community acquired pneumonia) [J18.9]  Patient is known to our practice and receives outpatient follow-up with Dr. Cherylann Ratel.  Patient was initially scheduled to begin outpatient dialysis treatments and was awaiting confirmation from outpatient clinic, [facility].    Patient seen resting in bed No family present Alert and oriented Currently eating breakfast Left arm remains sore   Objective:  Vital signs in last 24 hours:  Temp:  [97.7 F (36.5 C)-98.4 F (36.9 C)] 98.4 F (36.9 C) (03/30 1209) Pulse Rate:  [56-64] 56 (03/30 1209) Resp:  [16-18] 17 (03/30 1209) BP: (123-157)/(42-51) 138/42 (03/30 1209) SpO2:  [93 %-98 %] 97 % (03/30 1209) FiO2 (%):  [21 %] 21 % (03/29 2311) Weight:  [76.6 kg] 76.6 kg (03/30 0500)  Weight change: 0.2 kg Filed Weights   12/29/23 0531 12/29/23 0800 12/30/23 0500  Weight: 72.8 kg 72.1 kg 76.6 kg    Intake/Output: I/O last 3 completed shifts: In: -  Out: 2750 [Urine:2750]   Intake/Output this shift:  Total I/O In: 600 [P.O.:600] Out: -   Physical Exam: General: NAD  Head: Normocephalic, atraumatic. Moist oral mucosal membranes  Eyes: Anicteric  Lungs:  Diminished., normal effort.   Heart: Regular rate and rhythm  Abdomen:  Soft, nontender  Extremities:  No peripheral edema.  Neurologic: Alert, moving all four extremities  Skin: No lesions  Access: Lt AVF (bruised)     Basic Metabolic Panel: Recent Labs  Lab 12/24/23 0447 12/25/23 0940 12/27/23 0748 12/28/23 0846 12/29/23 0852  NA 139 139 136 133* 133*  K 3.7 3.3* 3.5 3.9 4.1  CL 101 105 101 98 99  CO2 22 25 24 25  24   GLUCOSE 124* 91 87 87 89  BUN 85* 91* 78* 64* 72*  CREATININE 2.88* 2.63* 2.41* 2.49* 2.42*  CALCIUM 10.0 10.3 8.8* 8.6* 9.2  PHOS 2.7 2.7 2.6 2.4* 2.4*    Liver Function Tests: Recent Labs  Lab 12/24/23 0447 12/25/23 0940 12/27/23 0748 12/28/23 0846 12/29/23 0852  ALBUMIN 2.9* 3.4* 2.8* 2.9* 3.1*   No results for input(s): "LIPASE", "AMYLASE" in the last 168 hours. No results for input(s): "AMMONIA" in the last 168 hours.  CBC: Recent Labs  Lab 12/25/23 0940 12/27/23 0748 12/28/23 0846 12/29/23 0852  WBC 9.2 8.9 9.7 9.0  HGB 9.3* 7.9* 8.4* 8.6*  HCT 29.3* 24.2* 26.0* 27.5*  MCV 86.7 85.5 86.1 89.0  PLT 225 219 212 231    Cardiac Enzymes: No results for input(s): "CKTOTAL", "CKMB", "CKMBINDEX", "TROPONINI" in the last 168 hours.  BNP: Invalid input(s): "POCBNP"  CBG: No results for input(s): "GLUCAP" in the last 168 hours.  Microbiology: Results for orders placed or performed during the hospital encounter of 12/21/23  Resp panel by RT-PCR (RSV, Flu A&B, Covid) Anterior Nasal Swab     Status: None   Collection Time: 12/21/23  3:30 PM   Specimen: Anterior Nasal Swab  Result Value Ref Range Status   SARS Coronavirus 2 by RT PCR NEGATIVE NEGATIVE Final    Comment: (NOTE) SARS-CoV-2 target nucleic acids are NOT DETECTED.  The SARS-CoV-2 RNA is generally detectable in upper respiratory specimens during the acute phase of infection. The lowest  concentration of SARS-CoV-2 viral copies this assay can detect is 138 copies/mL. A negative result does not preclude SARS-Cov-2 infection and should not be used as the sole basis for treatment or other patient management decisions. A negative result may occur with  improper specimen collection/handling, submission of specimen other than nasopharyngeal swab, presence of viral mutation(s) within the areas targeted by this assay, and inadequate number of viral copies(<138 copies/mL). A negative result must be combined  with clinical observations, patient history, and epidemiological information. The expected result is Negative.  Fact Sheet for Patients:  BloggerCourse.com  Fact Sheet for Healthcare Providers:  SeriousBroker.it  This test is no t yet approved or cleared by the [place] FDA and  has been authorized for detection and/or diagnosis of SARS-CoV-2 by FDA under an Emergency Use Authorization (EUA). This EUA will remain  in effect (meaning this test can be used) for the duration of the COVID-19 declaration under Section 564(b)(1) of the Act, 21 U.S.C.section 360bbb-3(b)(1), unless the authorization is terminated  or revoked sooner.       Influenza A by PCR NEGATIVE NEGATIVE Final   Influenza B by PCR NEGATIVE NEGATIVE Final    Comment: (NOTE) The Xpert Xpress SARS-CoV-2/FLU/RSV plus assay is intended as an aid in the diagnosis of influenza from Nasopharyngeal swab specimens and should not be used as a sole basis for treatment. Nasal washings and aspirates are unacceptable for Xpert Xpress SARS-CoV-2/FLU/RSV testing.  Fact Sheet for Patients: BloggerCourse.com  Fact Sheet for Healthcare Providers: SeriousBroker.it  This test is not yet approved or cleared by the [place] FDA and has been authorized for detection and/or diagnosis of SARS-CoV-2 by FDA under an Emergency Use Authorization (EUA). This EUA will remain in effect (meaning this test can be used) for the duration of the COVID-19 declaration under Section 564(b)(1) of the Act, 21 U.S.C. section 360bbb-3(b)(1), unless the authorization is terminated or revoked.     Resp Syncytial Virus by PCR NEGATIVE NEGATIVE Final    Comment: (NOTE) Fact Sheet for Patients: BloggerCourse.com  Fact Sheet for Healthcare Providers: SeriousBroker.it  This test is not yet approved  or cleared by the [place] FDA and has been authorized for detection and/or diagnosis of SARS-CoV-2 by FDA under an Emergency Use Authorization (EUA). This EUA will remain in effect (meaning this test can be used) for the duration of the COVID-19 declaration under Section 564(b)(1) of the Act, 21 U.S.C. section 360bbb-3(b)(1), unless the authorization is terminated or revoked.  Performed at [facility], [address] [place], [place] [address]   Blood Culture (routine x 2)     Status: None   Collection Time: 12/21/23  5:19 PM   Specimen: BLOOD  Result Value Ref Range Status   Specimen Description BLOOD BLOOD RIGHT HAND  Final   Special Requests   Final    BOTTLES DRAWN AEROBIC ONLY Blood Culture adequate volume   Culture   Final    NO GROWTH 5 DAYS Performed at [facility], [address], [place], [place] [address]    Report Status 12/26/2023 FINAL  Final  Blood Culture (routine x 2)     Status: None   Collection Time: 12/21/23  5:20 PM   Specimen: BLOOD  Result Value Ref Range Status   Specimen Description BLOOD BLOOD RIGHT ARM  Final   Special Requests   Final    BOTTLES DRAWN AEROBIC AND ANAEROBIC Blood Culture adequate volume   Culture   Final  NO GROWTH 5 DAYS Performed at [facility], [address] [place], [place] [address]    Report Status 12/26/2023 FINAL  Final  Respiratory (~20 pathogens) panel by PCR     Status: None   Collection Time: 12/22/23  2:55 PM   Specimen: Nasopharyngeal Swab; Respiratory  Result Value Ref Range Status   Adenovirus NOT DETECTED NOT DETECTED Final   Coronavirus 229E NOT DETECTED NOT DETECTED Final    Comment: (NOTE) The Coronavirus on the Respiratory Panel, DOES NOT test for the novel  Coronavirus (2019 nCoV)    Coronavirus HKU1 NOT DETECTED NOT DETECTED Final   Coronavirus NL63 NOT DETECTED NOT DETECTED Final   Coronavirus OC43 NOT DETECTED NOT DETECTED Final   Metapneumovirus NOT  DETECTED NOT DETECTED Final   Rhinovirus / Enterovirus NOT DETECTED NOT DETECTED Final   Influenza A NOT DETECTED NOT DETECTED Final   Influenza B NOT DETECTED NOT DETECTED Final   Parainfluenza Virus 1 NOT DETECTED NOT DETECTED Final   Parainfluenza Virus 2 NOT DETECTED NOT DETECTED Final   Parainfluenza Virus 3 NOT DETECTED NOT DETECTED Final   Parainfluenza Virus 4 NOT DETECTED NOT DETECTED Final   Respiratory Syncytial Virus NOT DETECTED NOT DETECTED Final   Bordetella pertussis NOT DETECTED NOT DETECTED Final   Bordetella Parapertussis NOT DETECTED NOT DETECTED Final   Chlamydophila pneumoniae NOT DETECTED NOT DETECTED Final   Mycoplasma pneumoniae NOT DETECTED NOT DETECTED Final    Comment: Performed at [facility], [address], [place], [place] [address]  MRSA Next Gen by PCR, Nasal     Status: None   Collection Time: 12/23/23  9:05 AM   Specimen: Nasal Mucosa; Nasal Swab  Result Value Ref Range Status   MRSA by PCR Next Gen NOT DETECTED NOT DETECTED Final    Comment: (NOTE) The GeneXpert MRSA Assay (FDA approved for NASAL specimens only), is one component of a comprehensive MRSA colonization surveillance program. It is not intended to diagnose MRSA infection nor to guide or monitor treatment for MRSA infections. Test performance is not FDA approved in patients less than 25 years old. Performed at [facility], [address] [place], [place] [address]     Coagulation Studies: No results for input(s): "LABPROT", "INR" in the last 72 hours.  Urinalysis: No results for input(s): "COLORURINE", "LABSPEC", "PHURINE", "GLUCOSEU", "HGBUR", "BILIRUBINUR", "KETONESUR", "PROTEINUR", "UROBILINOGEN", "NITRITE", "LEUKOCYTESUR" in the last 72 hours.  Invalid input(s): "APPERANCEUR"    Imaging: No results found.   Medications:      amLODipine  5 mg Oral Daily   apixaban  2.5 mg Oral BID   calcium-vitamin D  1 tablet Oral Daily   Chlorhexidine Gluconate  Cloth  6 each Topical Q0600   cyanocobalamin  500 mcg Oral Daily   epoetin alfa-epbx (RETACRIT) injection  4,000 Units Intravenous Q M,W,F-1800   fluticasone furoate-vilanterol  1 puff Inhalation Daily   folic acid  1 mg Oral Daily   guaiFENesin  600 mg Oral BID   hydrALAZINE  100 mg Oral TID   levothyroxine  150 mcg Oral Q0600   metoprolol tartrate  50 mg Oral BID   montelukast  10 mg Oral QHS   pantoprazole  40 mg Oral Daily   rosuvastatin  10 mg Oral Daily   sertraline  100 mg Oral Daily   acetaminophen **OR** acetaminophen, albuterol, bisacodyl, chlorpheniramine-HYDROcodone, docusate sodium, ondansetron **OR** ondansetron (ZOFRAN) IV, sodium chloride, traZODone  Assessment/ Plan:  [name] is a  83 y.o.  female with past medical conditions including diastolic heart failure, hypertension, hypothyroidism, COPD, and chronic kidney disease stage IV.   End-stage renal disease requiring hemodialysis.  Patient was originally scheduled to be set up outpatient however became symptomatic.   Appreciate vascular consult and will plan for angiogram tomorrow.  Will defer dialysis at this time and lieu of these findings.  2.  Acute respiratory failure, workup in progress for community-acquired pneumonia.  Chest x-ray shows vascular shin and trace edema with small pleural effusions, left greater than right.  Primary team has received IV Rocephin and azithromycin.  Room air  3. Anemia of chronic kidney diseas Lab Results  Component Value Date   HGB 8.6 (L) 12/29/2023    Hemoglobin at goal  Continue low dose Epogen with next dialysis.Marland Kitchen  4. Secondary Hyperparathyroidism: with outpatient labs: PTH 12, phosphorus 6.5, calcium 10 on 12/13/2023.   Lab Results  Component Value Date   CALCIUM 9.2 12/29/2023   PHOS 2.4 (L) 12/29/2023    Will continue to monitor bone minerals during this admission.   5.  Hypertension with chronic kidney disease.  Home regimen includes amlodipine, furosemide,  hydralazine, and metoprolol.  Furosemide held.   LOS: 9 Baylynn Shifflett 3/30/20252:23 PM

## 2023-12-30 NOTE — H&P (View-Only) (Signed)
 Vascular and Vein Specialist of [facility]  Patient name: [name] MRN: [id number] DOB: May 17, 1941 Sex: female   REQUESTING PROVIDER:    renal   REASON FOR CONSULT:    Fistula infiltrate  HISTORY OF PRESENT ILLNESS:   [name] is a 83 y.o. female, who is status post left brachiocephalic fistula by Dr. Gilda Crease on 05/19/2022.  He last saw her in the beginning of March 2025 for a right leg wound which has been healing.  This was secondary to a car door injury.  Her fistula had a mild stenosis.  Flow volumes were 1544 cc/min.  The patient recently started hemodialysis.  She has had several infiltrates.  PAST MEDICAL HISTORY    Past Medical History:  Diagnosis Date   Actinic keratosis    Albuminuria    Anemia    Arthritis    Asthma    Basal cell carcinoma 04/12/2017   Above right lateral brow. Nodulocystic type. EDC   Cancer (HCC)    skin   Cataract cortical, senile    CHF (congestive heart failure) (HCC)    Diabetes mellitus without complication (HCC)    GERD (gastroesophageal reflux disease)    Hemorrhoids    History of kidney stones    Hyperlipidemia    Hypertension    Hypothyroidism    Lyme disease    No kidney function    OSA (obstructive sleep apnea)    Osteoporosis    Osteoporosis    Reflux esophagitis    Steatohepatitis    Steatohepatitis      FAMILY HISTORY   Family History  Problem Relation Age of Onset   Breast cancer Mother    Heart attack Father     SOCIAL HISTORY:   Social History   Socioeconomic History   Marital status: Married    Spouse name: Not on file   Number of children: Not on file   Years of education: Not on file   Highest education level: Associate degree: academic program  Occupational History   Not on file  Tobacco Use   Smoking status: Never   Smokeless tobacco: Never  Vaping Use   Vaping status: Never Used  Substance and Sexual Activity   Alcohol use: No   Drug use: No    Sexual activity: Not on file  Other Topics Concern   Not on file  Social History Narrative   Lives at home with husband .   Social Drivers of Corporate investment banker Strain: Low Risk  (06/12/2023)   Overall Financial Resource Strain (CARDIA)    Difficulty of Paying Living Expenses: Not hard at all  Food Insecurity: No Food Insecurity (12/22/2023)   Hunger Vital Sign    Worried About Running Out of Food in the Last Year: Never true    Ran Out of Food in the Last Year: Never true  Transportation Needs: No Transportation Needs (12/22/2023)   PRAPARE - Administrator, Civil Service (Medical): No    Lack of Transportation (Non-Medical): No  Physical Activity: Insufficiently Active (06/12/2023)   Exercise Vital Sign    Days of Exercise per Week: 7 days    Minutes of Exercise per Session: 20 min  Stress: No Stress Concern Present (06/12/2023)   [employer] of Occupational Health - Occupational Stress Questionnaire    Feeling of Stress : Only a little  Social Connections: Moderately Isolated (12/22/2023)   Social Connection and Isolation Panel [NHANES]    Frequency of Communication  with Friends and Family: More than three times a week    Frequency of Social Gatherings with Friends and Family: More than three times a week    Attends Religious Services: Never    Database administrator or Organizations: No    Attends Banker Meetings: Never    Marital Status: Married  Catering manager Violence: Not At Risk (12/22/2023)   Humiliation, Afraid, Rape, and Kick questionnaire    Fear of Current or Ex-Partner: No    Emotionally Abused: No    Physically Abused: No    Sexually Abused: No    ALLERGIES:    Allergies  Allergen Reactions   Ace Inhibitors     Other reaction(s): Unknown   Egg-Derived Products Diarrhea   Other     Other reaction(s): Other (See Comments) Eggs   Prednisone     Other reaction(s): Other (See Comments) joint pain   Risedronate      Other reaction(s): Other (See Comments)   Sulfa Antibiotics Itching and Swelling    Other reaction(s): Other (See Comments)   Sulfasalazine Other (See Comments)    CURRENT MEDICATIONS:    Current Facility-Administered Medications  Medication Dose Route Frequency Provider Last Rate Last Admin   acetaminophen (TYLENOL) tablet 650 mg  650 mg Oral Q6H PRN Mansy, Jan A, MD   650 mg at 12/23/23 2207   Or   acetaminophen (TYLENOL) suppository 650 mg  650 mg Rectal Q6H PRN Mansy, Jan A, MD       albuterol (PROVENTIL) (2.5 MG/3ML) 0.083% nebulizer solution 2.5 mg  2.5 mg Nebulization Q2H PRN Arnetha Courser, MD   2.5 mg at 12/25/23 0306   amLODipine (NORVASC) tablet 5 mg  5 mg Oral Daily Mansy, Jan A, MD   5 mg at 12/30/23 1610   apixaban (ELIQUIS) tablet 2.5 mg  2.5 mg Oral BID Mansy, Jan A, MD   2.5 mg at 12/30/23 0950   bisacodyl (DULCOLAX) suppository 10 mg  10 mg Rectal Daily PRN Manuela Schwartz, NP       calcium-vitamin D (OSCAL WITH D) 500-5 MG-MCG per tablet 1 tablet  1 tablet Oral Daily Mansy, Jan A, MD   1 tablet at 12/30/23 9604   Chlorhexidine Gluconate Cloth 2 % PADS 6 each  6 each Topical Q0600 Wendee Beavers, NP   6 each at 12/30/23 0501   chlorpheniramine-HYDROcodone (TUSSIONEX) 10-8 MG/5ML suspension 5 mL  5 mL Oral Q12H PRN Mansy, Jan A, MD       cyanocobalamin (VITAMIN B12) tablet 500 mcg  500 mcg Oral Daily Mansy, Jan A, MD   500 mcg at 12/30/23 5409   docusate sodium (COLACE) capsule 100 mg  100 mg Oral Daily PRN Manuela Schwartz, NP   100 mg at 12/27/23 2125   epoetin alfa-epbx (RETACRIT) injection 4,000 Units  4,000 Units Intravenous Q M,W,F-1800 Breeze, Gery Pray, NP   4,000 Units at 12/28/23 1904   fluticasone furoate-vilanterol (BREO ELLIPTA) 100-25 MCG/ACT 1 puff  1 puff Inhalation Daily Marolyn Haller, MD   1 puff at 12/30/23 0954   folic acid (FOLVITE) tablet 1 mg  1 mg Oral Daily Arnetha Courser, MD   1 mg at 12/30/23 0950   guaiFENesin (MUCINEX) 12 hr tablet 600 mg   600 mg Oral BID Mansy, Jan A, MD   600 mg at 12/30/23 0950   hydrALAZINE (APRESOLINE) tablet 100 mg  100 mg Oral TID Mansy, Jan A, MD   100 mg at 12/30/23 [phone]  levothyroxine (SYNTHROID) tablet 150 mcg  150 mcg Oral Q0600 Otelia Sergeant, RPH   150 mcg at 12/30/23 1610   metoprolol tartrate (LOPRESSOR) tablet 50 mg  50 mg Oral BID Mansy, Jan A, MD   50 mg at 12/30/23 0951   montelukast (SINGULAIR) tablet 10 mg  10 mg Oral QHS Mansy, Jan A, MD   10 mg at 12/29/23 2116   ondansetron [facility]) tablet 4 mg  4 mg Oral Q6H PRN Mansy, Jan A, MD       Or   ondansetron [facility]) injection 4 mg  4 mg Intravenous Q6H PRN Mansy, Jan A, MD       pantoprazole (PROTONIX) EC tablet 40 mg  40 mg Oral Daily Mansy, Jan A, MD   40 mg at 12/30/23 9604   rosuvastatin (CRESTOR) tablet 10 mg  10 mg Oral Daily Wendee Beavers, NP   10 mg at 12/30/23 5409   sertraline (ZOLOFT) tablet 100 mg  100 mg Oral Daily Mansy, Jan A, MD   100 mg at 12/30/23 8119   sodium chloride (OCEAN) 0.65 % nasal spray 1 spray  1 spray Each Nare PRN Arnetha Courser, MD   1 spray at 12/23/23 2205   traZODone (DESYREL) tablet 25 mg  25 mg Oral QHS PRN Mansy, Jan A, MD   25 mg at 12/26/23 2138    REVIEW OF SYSTEMS:   [X]  denotes positive finding, [ ]  denotes negative finding Cardiac  Comments:  Chest pain or chest pressure:    Shortness of breath upon exertion:    Short of breath when lying flat:    Irregular heart rhythm:        Vascular    Pain in calf, thigh, or hip brought on by ambulation:    Pain in feet at night that wakes you up from your sleep:     Blood clot in your veins:    Leg swelling:         Pulmonary    Oxygen at home:    Productive cough:     Wheezing:         Neurologic    Sudden weakness in arms or legs:     Sudden numbness in arms or legs:     Sudden onset of difficulty speaking or slurred speech:    Temporary loss of vision in one eye:     Problems with dizziness:         Gastrointestinal    Blood in  stool:      Vomited blood:         Genitourinary    Burning when urinating:     Blood in urine:        Psychiatric    Major depression:         Hematologic    Bleeding problems:    Problems with blood clotting too easily:        Skin    Rashes or ulcers:        Constitutional    Fever or chills:     PHYSICAL EXAM:   Vitals:   12/29/23 2311 12/30/23 0414 12/30/23 0500 12/30/23 0819  BP: (!) 139/48 (!) 142/51  (!) 157/47  Pulse: 63 62  61  Resp: 16 17  18   Temp: 98 F (36.7 C) 97.7 F (36.5 C)  97.7 F (36.5 C)  TempSrc: Oral Oral  Oral  SpO2: 97% 98%  95%  Weight:   76.6 kg   Height:  GENERAL: The patient is a well-nourished female, in no acute distress. The vital signs are documented above. CARDIAC: There is a regular rate and rhythm.  VASCULAR: Palpable thrill within the left brachiocephalic fistula which is very tender with surrounding ecchymosis PULMONARY: Nonlabored respirations MUSCULOSKELETAL: There are no major deformities or cyanosis. NEUROLOGIC: No focal weakness or paresthesias are detected. SKIN: There are no ulcers or rashes noted. PSYCHIATRIC: The patient has a normal affect.  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  She has had attempts at cannulation of her fistula in the outpatient setting which were unsuccessful.  She now has significant ecchymosis and tenderness on her arm.  There is a known stenosis from duplex of her fistula.  The neck step is to proceed with left brachiocephalic fistulogram.  Simultaneously we will place a tunneled dialysis catheter to let her arm rest.  She will need to be n.p.o. after midnight with plans for procedure tomorrow on Monday.   Charlena Cross, MD, FACS [facility] Vein Specialists of [facility] [phone] Pager [phone]

## 2023-12-30 NOTE — Plan of Care (Signed)

## 2023-12-31 ENCOUNTER — Ambulatory Visit (INDEPENDENT_AMBULATORY_CARE_PROVIDER_SITE_OTHER): Payer: Medicare Other

## 2023-12-31 ENCOUNTER — Ambulatory Visit: Payer: Medicare Other | Admitting: Dermatology

## 2023-12-31 ENCOUNTER — Encounter
Admission: EM | Disposition: A | Discharge status: Home Health | Payer: Self-pay | Source: Home / Self Care | Attending: Hospitalist

## 2023-12-31 DIAGNOSIS — T82858A Stenosis of vascular prosthetic devices, implants and grafts, initial encounter: Secondary | ICD-10-CM

## 2023-12-31 DIAGNOSIS — Z992 Dependence on renal dialysis: Secondary | ICD-10-CM

## 2023-12-31 DIAGNOSIS — N186 End stage renal disease: Secondary | ICD-10-CM

## 2023-12-31 DIAGNOSIS — J189 Pneumonia, unspecified organism: Secondary | ICD-10-CM | POA: Diagnosis not present

## 2023-12-31 HISTORY — PX: DIALYSIS/PERMA CATHETER INSERTION: CATH118288

## 2023-12-31 HISTORY — PX: A/V FISTULAGRAM: CATH118298

## 2023-12-31 LAB — CBC
HCT: 25.5 % — ABNORMAL LOW (ref 36.0–46.0)
Hemoglobin: 8.4 g/dL — ABNORMAL LOW (ref 12.0–15.0)
MCH: 27.9 pg (ref 26.0–34.0)
MCHC: 32.9 g/dL (ref 30.0–36.0)
MCV: 84.7 fL (ref 80.0–100.0)
Platelets: 209 10*3/uL (ref 150–400)
RBC: 3.01 MIL/uL — ABNORMAL LOW (ref 3.87–5.11)
RDW: 16.7 % — ABNORMAL HIGH (ref 11.5–15.5)
WBC: 7.8 10*3/uL (ref 4.0–10.5)
nRBC: 0 % (ref 0.0–0.2)

## 2023-12-31 LAB — BASIC METABOLIC PANEL WITH GFR
Anion gap: 12 (ref 5–15)
BUN: 81 mg/dL — ABNORMAL HIGH (ref 8–23)
CO2: 22 mmol/L (ref 22–32)
Calcium: 9.1 mg/dL (ref 8.9–10.3)
Chloride: 101 mmol/L (ref 98–111)
Creatinine, Ser: 3.02 mg/dL — ABNORMAL HIGH (ref 0.44–1.00)
GFR, Estimated: 15 mL/min — ABNORMAL LOW (ref >60)
Glucose, Bld: 85 mg/dL (ref 70–99)
Potassium: 4.4 mmol/L (ref 3.5–5.1)
Sodium: 135 mmol/L (ref 135–145)

## 2023-12-31 LAB — MAGNESIUM: Magnesium: 1.4 mg/dL — ABNORMAL LOW (ref 1.7–2.4)

## 2023-12-31 SURGERY — A/V FISTULAGRAM
Anesthesia: Moderate Sedation

## 2023-12-31 MED ORDER — HEPARIN SODIUM (PORCINE) 1000 UNIT/ML IJ SOLN
INTRAMUSCULAR | Status: DC | PRN
Start: 2023-12-31 — End: 2023-12-31
  Administered 2023-12-31: 3000 [IU] via INTRAVENOUS

## 2023-12-31 MED ORDER — CEFAZOLIN SODIUM-DEXTROSE 1-4 GM/50ML-% IV SOLN
INTRAVENOUS | Status: DC | PRN
  Administered 2023-12-31: 1 g via INTRAVENOUS

## 2023-12-31 MED ORDER — DIPHENHYDRAMINE HCL 50 MG/ML IJ SOLN
50.0000 mg | Freq: Once | INTRAMUSCULAR | Status: DC | PRN
Start: 2023-12-31 — End: 2023-12-31

## 2023-12-31 MED ORDER — MIDAZOLAM HCL 2 MG/2ML IJ SOLN
INTRAMUSCULAR | Status: AC
  Filled 2023-12-31: qty 2

## 2023-12-31 MED ORDER — HEPARIN (PORCINE) IN NACL 1000-0.9 UT/500ML-% IV SOLN
INTRAVENOUS | Status: DC | PRN
  Administered 2023-12-31: 500 mL

## 2023-12-31 MED ORDER — FENTANYL CITRATE PF 50 MCG/ML IJ SOSY: 12.5000 ug | PREFILLED_SYRINGE | Freq: Once | INTRAMUSCULAR | Status: DC | PRN

## 2023-12-31 MED ORDER — CEFAZOLIN SODIUM-DEXTROSE 1-4 GM/50ML-% IV SOLN
INTRAVENOUS | Status: AC
Start: 2023-12-31 — End: ?
  Filled 2023-12-31: qty 50

## 2023-12-31 MED ORDER — HEPARIN SODIUM (PORCINE) 10000 UNIT/ML IJ SOLN
INTRAMUSCULAR | Status: DC | PRN
  Administered 2023-12-31: 10000 [IU]

## 2023-12-31 MED ORDER — MIDAZOLAM HCL 2 MG/2ML IJ SOLN
INTRAMUSCULAR | Status: DC | PRN
  Administered 2023-12-31: .5 mg via INTRAVENOUS

## 2023-12-31 MED ORDER — FENTANYL CITRATE (PF) 100 MCG/2ML IJ SOLN
INTRAMUSCULAR | Status: DC | PRN
  Administered 2023-12-31: 25 ug via INTRAVENOUS

## 2023-12-31 MED ORDER — IODIXANOL 320 MG/ML IV SOLN
INTRAVENOUS | Status: DC | PRN
  Administered 2023-12-31: 45 mL

## 2023-12-31 MED ORDER — FAMOTIDINE 20 MG PO TABS: 40.0000 mg | ORAL_TABLET | Freq: Once | ORAL | Status: DC | PRN

## 2023-12-31 MED ORDER — CEFAZOLIN SODIUM-DEXTROSE 1-4 GM/50ML-% IV SOLN: 1.0000 g | INTRAVENOUS | Status: DC

## 2023-12-31 MED ORDER — MIDAZOLAM HCL 2 MG/ML PO SYRP: 8.0000 mg | ORAL_SOLUTION | Freq: Once | ORAL | Status: DC | PRN

## 2023-12-31 MED ORDER — METHYLPREDNISOLONE SODIUM SUCC 125 MG IJ SOLR: 125.0000 mg | Freq: Once | INTRAMUSCULAR | Status: DC | PRN

## 2023-12-31 MED ORDER — MAGNESIUM SULFATE 4 GM/100ML IV SOLN
4.0000 g | Freq: Once | INTRAVENOUS | Status: AC
  Administered 2023-12-31: 4 g via INTRAVENOUS
  Filled 2023-12-31: qty 100

## 2023-12-31 MED ORDER — FENTANYL CITRATE PF 50 MCG/ML IJ SOSY
PREFILLED_SYRINGE | INTRAMUSCULAR | Status: AC
  Filled 2023-12-31: qty 1

## 2023-12-31 MED ORDER — SODIUM CHLORIDE 0.9 % IV SOLN: INTRAVENOUS | Status: DC

## 2023-12-31 MED ORDER — LIDOCAINE-EPINEPHRINE (PF) 1 %-1:200000 IJ SOLN
INTRAMUSCULAR | Status: DC | PRN
  Administered 2023-12-31: 30 mL

## 2023-12-31 MED ORDER — HEPARIN SODIUM (PORCINE) 1000 UNIT/ML IJ SOLN
INTRAMUSCULAR | Status: AC
  Filled 2023-12-31: qty 10

## 2023-12-31 SURGICAL SUPPLY — 17 items
BALLN LUTONIX AV 9X40X75 (BALLOONS) ×1 IMPLANT
BALLN LUTONIX DCB 7X60X130 (BALLOONS) ×1 IMPLANT
BALLOON LUTONIX AV 9X40X75 (BALLOONS) IMPLANT
BALLOON LUTONIX DCB 7X60X130 (BALLOONS) IMPLANT
CANNULA 5F STIFF (CANNULA) IMPLANT
CATH CANNON HEMO 15FR 23CM (HEMODIALYSIS SUPPLIES) IMPLANT
COVER PROBE ULTRASOUND 5X96 (MISCELLANEOUS) IMPLANT
DERMABOND ADVANCED .7 DNX12 (GAUZE/BANDAGES/DRESSINGS) IMPLANT
DEVICE PRESTO INFLATION (MISCELLANEOUS) IMPLANT
DRAPE BRACHIAL (DRAPES) IMPLANT
PACK ANGIOGRAPHY (CUSTOM PROCEDURE TRAY) ×1 IMPLANT
SHEATH BRITE TIP 6FRX5.5 (SHEATH) IMPLANT
SHEATH BRITE TIP 8FR 5.5 (SHEATH) IMPLANT
STENT VIABAHN 10X5X120 (Permanent Stent) IMPLANT
SUT MNCRL AB 4-0 PS2 18 (SUTURE) IMPLANT
SUT PROLENE 0 CT 1 30 (SUTURE) IMPLANT
WIRE SUPRACORE 190CM (WIRE) IMPLANT

## 2023-12-31 NOTE — Progress Notes (Signed)
 PROGRESS NOTE    [name]  UJW:[id number] DOB: [birth year] DOA: 12/21/2023 PCP: Patient, No Pcp Per  233A/233A-AA  LOS: 10 days   Brief hospital course:   Assessment & Plan: [name] is a 83 y.o. female with medical history significant for asthma, osteoarthritis CHF, type 2 diabetes mellitus, GERD, hypertension, dyslipidemia, hypothyroidism, OSA, stage IV chronic kidney disease, and hypothyroidism who presented to the emergency room with productive cough over the last few days with associated chest congestion, fever, as well as chills.  She had COVID about a month ago.  She was noted to be mildly hypoxic with pulse oximeter reading 90% on room air with EMS.  This improved to 93% on 4 L of O2 by nasal cannula.  She is currently awaiting initiation of hemodialysis, but makes urine.    * CAP (community acquired pneumonia) Patient presented with productive cough and fever. She had new oxygen requirement and was afebrile at the hospital. She had no leukocytosis with elevated procalcitonin. CXR with no focal consolidation but sm pleural effusions and vascular congestion.  Completed antibiotics course.   COPD exacerbation (HCC) Patient with recent PFTs, with FEV1/FVC ratio normal, FEV1 and FVC normal. She had no significant bronchodilator response. She did however note improvement in her symptoms with broncho dilators. Her pulmonologist felt she could have mild obstructive airway disease.  --completed steroid burst --cont bronchodilator and DuoNeb   Acute respiratory failure with hypoxia (HCC) Likely secondary to above. Possible component of exacerbation of HFpEF as well (BNP elevated and noted to have interstitial edema/vascular congestion on admission CXR) --weaned down to RA.   Acute on chronic HFpEF exacerbation  Clinical and laboratory evidence of volume overload. Continue antihypertensives.  Patient had pretty much normal echocardiogram 6 months ago. -Holding Lasix due to  increasing creatinine --HD for volume removal   CKD stage 4 now ESRD Due to worsening creatinine despite holding Lasix nephrology was consulted and patient is being started on dialysis, she already had a fistula in place in the preparation as outpatient. --outpatient dialysis placement set up  AV fistula malfunction Unable to dialyze today due to venous infiltrate --Vascular intervention today --Permcath placement   Normocytic anemia  Thrombocytopenia -mild  No obvious evidence of bleeding.  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  Also mild thrombocytopenia. -Anemia panel with borderline folic acid -Patient get Aranesp at home --cont folic acid suppl (new)   Hypokalemia --correct with dialysis   Essential hypertension --cont amlodipine, hydralazine, Lopressor   Depression --cont zoloft   GERD without esophagitis --cont PPI   Paroxysmal atrial fibrillation (HCC) In sinus rhythm.   --cont lopressor and Eliquis   Dyslipidemia Continue statin therapy   Hypothyroidism --cont Synthroid  Hypomag --monitor and supplement PRN   DVT prophylaxis: FA:OZHYQMV Code Status: DNR  Family Communication:  Level of care: Telemetry Cardiac Dispo:   The patient is from: home Anticipated d/c is to: home Anticipated d/c date is: tomorrow   Subjective and Interval History:  Pt underwent vascular intervention of her AVF today, and PermCath placement.   Objective: Vitals:   12/31/23 1315 12/31/23 1330 12/31/23 1702 12/31/23 1727  BP: (!) 141/41 (!) 113/42 (!) 132/42 (!) 135/40  Pulse: (!) 48 (!) 58 (!) 55 (!) 52  Resp: 19 18    Temp:  97.8 F (36.6 C)  98.4 F (36.9 C)  TempSrc:  Oral    SpO2: 94% 92%  98%  Weight:      Height:  Intake/Output Summary (Last 24 hours) at 12/31/2023 1758 Last data filed at 12/31/2023 1722 Gross per 24 hour  Intake --  Output 1300 ml  Net -1300 ml   Filed Weights   12/29/23 0800 12/30/23 0500 12/31/23 0500  Weight: 72.1 kg  76.6 kg 76.6 kg    Examination:   Constitutional: NAD, AAOx3 HEENT: conjunctivae and lids normal, EOMI CV: No cyanosis.   RESP: normal respiratory effort, on RA Neuro: II - XII grossly intact.   Psych: Normal mood and affect.  Appropriate judgement and reason   Data Reviewed: I have personally reviewed labs and imaging studies  Time spent: 35 minutes  Darlin Priestly, MD [facility] If 7PM-7AM, please contact night-coverage 12/31/2023, 5:58 PM

## 2023-12-31 NOTE — Op Note (Signed)
 OPERATIVE NOTE    PRE-OPERATIVE DIAGNOSIS: 1. ESRD 2.  Infiltrated left upper extremity AV fistula precluding current use  POST-OPERATIVE DIAGNOSIS: same as above  PROCEDURE: Ultrasound guidance for vascular access to the left internal jugular vein Fluoroscopic guidance for placement of catheter Placement of a 23 cm tip to cuff tunneled hemodialysis catheter via the left internal jugular vein  SURGEON: Festus Barren, MD  ANESTHESIA:  Local with Moderate conscious sedation for approximately 39 minutes using 0.5 mg of Versed and 25 mcg of Fentanyl (for both procedures)  ESTIMATED BLOOD LOSS: 10 cc  FLUORO TIME: 2.4 minutes (for both procedures)  CONTRAST: none  FINDING(S): 1.  Patent left internal jugular vein  SPECIMEN(S):  None  INDICATIONS:   [name] is a 83 y.o. female who presents with renal failure and an infiltrated left upper extremity AV fistula that precludes current use.  The patient needs long term dialysis access for their ESRD, and a Permcath is necessary.  Risks and benefits are discussed and informed consent is obtained.    DESCRIPTION: After obtaining full informed written consent, the patient was brought back to the vascular suited. The patient's left neck and chest were sterilely prepped and draped in a sterile surgical field was created. Moderate conscious sedation was administered during a face to face encounter with the patient throughout the procedure with my supervision of the RN administering medicines and monitoring the patient's vital signs, pulse oximetry, telemetry and mental status throughout from the start of the procedure until the patient was taken to the recovery room.  The left internal jugular vein was visualized with ultrasound and found to be patent. It was then accessed under direct ultrasound guidance and a permanent image was recorded. A wire was placed. After skin nick and dilatation, the peel-away sheath was placed over the wire. I then  turned my attention to an area under the clavicle. Approximately 1-2 fingerbreadths below the clavicle a small counterincision was created and tunneled from the subclavicular incision to the access site. Using fluoroscopic guidance, a 23 centimeter tip to cuff tunneled hemodialysis catheter was selected, and tunneled from the subclavicular incision to the access site. It was then placed through the peel-away sheath and the peel-away sheath was removed. Using fluoroscopic guidance the catheter tips were parked in the right atrium. The appropriate distal connectors were placed. It withdrew blood well and flushed easily with heparinized saline and a concentrated heparin solution was then placed. It was secured to the chest wall with 2 Prolene sutures. The access incision was closed single 4-0 Monocryl. A 4-0 Monocryl pursestring suture was placed around the exit site. Sterile dressings were placed. The patient tolerated the procedure well and was taken to the recovery room in stable condition.  COMPLICATIONS: None  CONDITION: Stable  Festus Barren  12/31/2023, 12:52 PM   This note was created with Dragon Medical transcription system. Any errors in dictation are purely unintentional.

## 2023-12-31 NOTE — Plan of Care (Signed)

## 2023-12-31 NOTE — Interval H&P Note (Signed)
 History and Physical Interval Note:  12/31/2023 11:16 AM  [name]  has presented today for surgery, with the diagnosis of esrd.  The various methods of treatment have been discussed with the patient and family. After consideration of risks, benefits and other options for treatment, the patient has consented to  Procedure(s): A/V Fistulagram (N/A) DIALYSIS/PERMA CATHETER INSERTION (N/A) as a surgical intervention.  The patient's history has been reviewed, patient examined, no change in status, stable for surgery.  I have reviewed the patient's chart and labs.  Questions were answered to the patient's satisfaction.     Festus Barren

## 2023-12-31 NOTE — Progress Notes (Signed)
 PT Cancellation Note  Patient Details Name: [name] MRN: [id number] DOB: 06-18-1941   Cancelled Treatment:    Reason Eval/Treat Not Completed: Patient at procedure or test/unavailable.  PT will continue to follow pt as pt is available.   Hortencia Conradi, PTA  12/31/23, 9:35 AM

## 2023-12-31 NOTE — Progress Notes (Signed)
 Occupational Therapy Treatment Patient Details Name: [name] MRN: [id number] DOB: [birth year] Today's Date: 12/31/2023   History of present illness Pt is an 83 year old female presents to the ED with productive cough over the last few days with associated chest congestion, fever, as well as chills, Admitted with CAP, COPD exacerbation, Acute respiratory failure with hypoxia, HFpEF exacerbation     PMH significant for asthma, osteoarthritis CHF, type 2 diabetes mellitus, GERD, hypertension, dyslipidemia, hypothyroidism, OSA, stage IV chronic kidney disease, and hypothyroidism   OT comments  Pt is seated on EOB on arrival. Pleasant and agreeable to OT session. She denies pain. Pt performed LB dressing to don/doff socks with SUP seated EOB, STS with SUP to RW and ambulated ~140 feet mostly using RW with SUP and one standing rest break, then progressed to HHA x1 for ~30 feet and without UE support last 5 feet back to bed with CGA. Pt reports use of shower tub bench at home when needed/feeling dizzy and verbalizes good understanding of DME/AE/AD and when to utilize. She reports her niece is her caregiver at home.  Pt returned to seated EOB with all needs in place and will cont to require skilled acute OT services to maximize her safety and IND to return to PLOF.       If plan is discharge home, recommend the following:  A little help with walking and/or transfers;A little help with bathing/dressing/bathroom;Help with stairs or ramp for entrance;Assistance with cooking/housework   Equipment Recommendations  None recommended by OT    Recommendations for Other Services      Precautions / Restrictions Precautions Precautions: Fall Restrictions Weight Bearing Restrictions Per Provider Order: No       Mobility Bed Mobility               General bed mobility comments: NT on edge of bed pre/post session    Transfers Overall transfer level: Needs assistance Equipment used: Rolling  walker (2 wheels) Transfers: Sit to/from Stand Sit to Stand: Supervision           General transfer comment: SUP for STS from EOB and SUP for mobility using RW progressing to HHA with CGA then without HHA and CGA     Balance Overall balance assessment: Needs assistance Sitting-balance support: Feet supported Sitting balance-Leahy Scale: Good     Standing balance support: No upper extremity supported, During functional activity Standing balance-Leahy Scale: Fair Standing balance comment: CGA without RW; SUP with RW                           ADL either performed or assessed with clinical judgement   ADL Overall ADL's : Needs assistance/impaired     Grooming: Supervision/safety;Standing Grooming Details (indicate cue type and reason): at sink in room             Lower Body Dressing: Supervision/safety;Sitting/lateral leans Lower Body Dressing Details (indicate cue type and reason): to don socks             Functional mobility during ADLs: Supervision/safety;Contact guard assist General ADL Comments: SUP using RW ~100 feet and CGA via HHA for ~30 feet and without HHA for ~5 feet    Extremity/Trunk Assessment              Vision       Perception     Praxis     Communication Communication Communication: No apparent difficulties   Cognition Arousal: Alert  Behavior During Therapy: [facility] for tasks assessed/performed                                 Following commands: Intact        Cueing   Cueing Techniques: Verbal cues  Exercises      Shoulder Instructions       General Comments on RA    Pertinent Vitals/ Pain       Pain Assessment Pain Assessment: No/denies pain  Home Living                                          Prior Functioning/Environment              Frequency  Min 2X/week        Progress Toward Goals  OT Goals(current goals can now be found in the care plan section)   Progress towards OT goals: Progressing toward goals  Acute Rehab OT Goals Patient Stated Goal: return home OT Goal Formulation: With patient Time For Goal Achievement: 01/06/24 Potential to Achieve Goals: Good  Plan      Co-evaluation                 AM-PAC OT "6 Clicks" Daily Activity     Outcome Measure   Help from another person eating meals?: None Help from another person taking care of personal grooming?: None Help from another person toileting, which includes using toliet, bedpan, or urinal?: None Help from another person bathing (including washing, rinsing, drying)?: A Little Help from another person to put on and taking off regular upper body clothing?: None Help from another person to put on and taking off regular lower body clothing?: A Little 6 Click Score: 22    End of Session Equipment Utilized During Treatment: Rolling walker (2 wheels);Gait belt  OT Visit Diagnosis: Other abnormalities of gait and mobility (R26.89)   Activity Tolerance Patient tolerated treatment well   Patient Left with call bell/phone within reach;in bed (seated EOB)   Nurse Communication Mobility status        Time: 1610-9604 OT Time Calculation (min): 23 min  Charges: OT General Charges $OT Visit: 1 Visit OT Treatments $Self Care/Home Management : 8-22 mins $Therapeutic Activity: 8-22 mins  Kristofer Schaffert, OTR/L  12/31/23, 11:09 AM   [name] [name] 12/31/2023, 11:07 AM

## 2023-12-31 NOTE — Op Note (Signed)
 [facility]    OPERATIVE NOTE   PROCEDURE: 1.   Left brachiocephalic arteriovenous fistula cannulation under ultrasound guidance 2.   Left arm fistulagram including central venogram 3.   Percutaneous transluminal angioplasty of mid upper arm cephalic vein with 7 mm diameter Lutonix drug-coated angioplasty balloon 4.   Stent placement to the left upper arm cephalic vein with 10 mm diameter by 5 cm length Viabahn stent for significant stenosis after angioplasty  PRE-OPERATIVE DIAGNOSIS: 1. ESRD 2. Poorly functional left brachiocephalic AVF  POST-OPERATIVE DIAGNOSIS: same as above   SURGEON: Festus Barren, MD  ANESTHESIA: local with MCS  ESTIMATED BLOOD LOSS: 5 cc  FINDING(S): 80% stenosis of the mid upper arm cephalic vein.  The remainder of the left brachiocephalic AV fistula was widely patent as was the central venous circulation  SPECIMEN(S):  None  CONTRAST: 45 cc  FLUORO TIME: 2.4 minutes (for both procedures)  MODERATE CONSCIOUS SEDATION TIME: Approximately 39 minutes with 0.5 mg of Versed and 25 mcg of Fentanyl (for both procedures)  INDICATIONS: [name] is a 83 y.o. female who presents with malfunctioning left brachiocephalic arteriovenous fistula.  She has had a large amount of infiltration in the left upper extremity after attempts at using this fistula.  She will also have a PermCath placed today which will be dictated separately.  The patient is scheduled for left arm fistulagram.  The patient is aware the risks include but are not limited to: bleeding, infection, thrombosis of the cannulated access, and possible anaphylactic reaction to the contrast.  The patient is aware of the risks of the procedure and elects to proceed forward.  DESCRIPTION: After full informed written consent was obtained, the patient was brought back to the angiography suite and placed supine upon the angiography table.  The patient was connected to monitoring  equipment. Moderate conscious sedation was administered with a face to face encounter with the patient throughout the procedure with my supervision of the RN administering medicines and monitoring the patient's vital signs and mental status throughout from the start of the procedure until the patient was taken to the recovery room. The left arm was prepped and draped in the standard fashion for a percutaneous access intervention.  Under ultrasound guidance, the perianastomotic portion of the left brachiocephalic arteriovenous fistula was cannulated with a micropuncture needle under direct ultrasound guidance where it was patent and a permanent image was performed.  The microwire was advanced into the fistula and the needle was exchanged for the a microsheath.  I then upsized to a 6 Fr Sheath and imaging was performed.  Hand injections were completed to image the access including the central venous system. This demonstrated 80% stenosis of the mid upper arm cephalic vein.  The remainder of the left brachiocephalic AV fistula was widely patent as was the central venous circulation.  Based on the images, this patient will need intervention to the stenosis.  With the significant infiltration in the left arm, I still think she will need a PermCath for the next couple of weeks. I then gave the patient 3000 units of intravenous heparin.  I then crossed the stenosis with a Magic Tourqe wire.  Based on the imaging, a 7 mm x 6 cm Lutonix drug-coated angioplasty balloon was selected.  The balloon was centered around the upper arm cephalic vein stenosis and inflated to 12 ATM for 1 minute(s).  On completion imaging, a greater than 70% residual stenosis was present.  I then upsized  to an 8 [place] sheath and selected a 10 mm diameter by 5 cm length Viabahn stent and deployed it across the stenosis.  This was postdilated with a 9 mm balloon with excellent angiographic completion result and less than 10% residual  stenosis.   Based on the completion imaging, no further intervention is necessary to the fistula.  A PermCath will be placed and will be dictated separately.  The wire and balloon were removed from the sheath.  A 4-0 Monocryl purse-string suture was sewn around the sheath.  The sheath was removed while tying down the suture.  A sterile bandage was applied to the puncture site.  COMPLICATIONS: None  CONDITION: Stable   Festus Barren  12/31/2023 12:45 PM   This note was created with Dragon Medical transcription system. Any errors in dictation are purely unintentional.

## 2023-12-31 NOTE — Progress Notes (Signed)
 [facility]  ROUNDING NOTE   Subjective:   [name] is a 83 y.o. female with past medical conditions including diastolic heart failure, hypertension, hypothyroidism, COPD, and chronic kidney disease stage IV. Patient presents to ED with shortness of breath and has been admitted for Hypoxia [R09.02] COPD exacerbation (HCC) [J44.1] CAP (community acquired pneumonia) [J18.9]  Patient is known to our practice and receives outpatient follow-up with Dr. Cherylann Ratel.    Patient seen ambulating in room with therapy Room air Appears well, in good spirits N.p.o. for vascular procedure   Objective:  Vital signs in last 24 hours:  Temp:  [97.7 F (36.5 C)-98.4 F (36.9 C)] 98 F (36.7 C) (03/31 1109) Pulse Rate:  [52-59] 54 (03/31 1109) Resp:  [17-18] 18 (03/31 0334) BP: (117-165)/(33-46) 165/37 (03/31 1109) SpO2:  [94 %-97 %] 95 % (03/31 1109) Weight:  [76.6 kg] 76.6 kg (03/31 0500)  Weight change: 4.5 kg Filed Weights   12/29/23 0800 12/30/23 0500 12/31/23 0500  Weight: 72.1 kg 76.6 kg 76.6 kg    Intake/Output: I/O last 3 completed shifts: In: 600 [P.O.:600] Out: 1800 [Urine:1800]   Intake/Output this shift:  No intake/output data recorded.  Physical Exam: General: NAD  Head: Normocephalic, atraumatic. Moist oral mucosal membranes  Eyes: Anicteric  Lungs:  Diminished., normal effort.   Heart: Regular rate and rhythm  Abdomen:  Soft, nontender  Extremities:  No peripheral edema.  Neurologic: Alert, moving all four extremities  Skin: No lesions  Access: Lt AVF (bruised)     Basic Metabolic Panel: Recent Labs  Lab 12/25/23 0940 12/27/23 0748 12/28/23 0846 12/29/23 0852 12/31/23 0454  NA 139 136 133* 133* 135  K 3.3* 3.5 3.9 4.1 4.4  CL 105 101 98 99 101  CO2 25 24 25 24 22   GLUCOSE 91 87 87 89 85  BUN 91* 78* 64* 72* 81*  CREATININE 2.63* 2.41* 2.49* 2.42* 3.02*  CALCIUM 10.3 8.8* 8.6* 9.2 9.1  MG  --   --   --   --  1.4*  PHOS 2.7 2.6 2.4*  2.4*  --     Liver Function Tests: Recent Labs  Lab 12/25/23 0940 12/27/23 0748 12/28/23 0846 12/29/23 0852  ALBUMIN 3.4* 2.8* 2.9* 3.1*   No results for input(s): "LIPASE", "AMYLASE" in the last 168 hours. No results for input(s): "AMMONIA" in the last 168 hours.  CBC: Recent Labs  Lab 12/25/23 0940 12/27/23 0748 12/28/23 0846 12/29/23 0852 12/31/23 0454  WBC 9.2 8.9 9.7 9.0 7.8  HGB 9.3* 7.9* 8.4* 8.6* 8.4*  HCT 29.3* 24.2* 26.0* 27.5* 25.5*  MCV 86.7 85.5 86.1 89.0 84.7  PLT 225 219 212 231 209    Cardiac Enzymes: No results for input(s): "CKTOTAL", "CKMB", "CKMBINDEX", "TROPONINI" in the last 168 hours.  BNP: Invalid input(s): "POCBNP"  CBG: No results for input(s): "GLUCAP" in the last 168 hours.  Microbiology: Results for orders placed or performed during the hospital encounter of 12/21/23  Resp panel by RT-PCR (RSV, Flu A&B, Covid) Anterior Nasal Swab     Status: None   Collection Time: 12/21/23  3:30 PM   Specimen: Anterior Nasal Swab  Result Value Ref Range Status   SARS Coronavirus 2 by RT PCR NEGATIVE NEGATIVE Final    Comment: (NOTE) SARS-CoV-2 target nucleic acids are NOT DETECTED.  The SARS-CoV-2 RNA is generally detectable in upper respiratory specimens during the acute phase of infection. The lowest concentration of SARS-CoV-2 viral copies this assay can detect is 138  copies/mL. A negative result does not preclude SARS-Cov-2 infection and should not be used as the sole basis for treatment or other patient management decisions. A negative result may occur with  improper specimen collection/handling, submission of specimen other than nasopharyngeal swab, presence of viral mutation(s) within the areas targeted by this assay, and inadequate number of viral copies(<138 copies/mL). A negative result must be combined with clinical observations, patient history, and epidemiological information. The expected result is Negative.  Fact Sheet for  Patients:  BloggerCourse.com  Fact Sheet for Healthcare Providers:  SeriousBroker.it  This test is no t yet approved or cleared by the [place] FDA and  has been authorized for detection and/or diagnosis of SARS-CoV-2 by FDA under an Emergency Use Authorization (EUA). This EUA will remain  in effect (meaning this test can be used) for the duration of the COVID-19 declaration under Section 564(b)(1) of the Act, 21 U.S.C.section 360bbb-3(b)(1), unless the authorization is terminated  or revoked sooner.       Influenza A by PCR NEGATIVE NEGATIVE Final   Influenza B by PCR NEGATIVE NEGATIVE Final    Comment: (NOTE) The Xpert Xpress SARS-CoV-2/FLU/RSV plus assay is intended as an aid in the diagnosis of influenza from Nasopharyngeal swab specimens and should not be used as a sole basis for treatment. Nasal washings and aspirates are unacceptable for Xpert Xpress SARS-CoV-2/FLU/RSV testing.  Fact Sheet for Patients: BloggerCourse.com  Fact Sheet for Healthcare Providers: SeriousBroker.it  This test is not yet approved or cleared by the [place] FDA and has been authorized for detection and/or diagnosis of SARS-CoV-2 by FDA under an Emergency Use Authorization (EUA). This EUA will remain in effect (meaning this test can be used) for the duration of the COVID-19 declaration under Section 564(b)(1) of the Act, 21 U.S.C. section 360bbb-3(b)(1), unless the authorization is terminated or revoked.     Resp Syncytial Virus by PCR NEGATIVE NEGATIVE Final    Comment: (NOTE) Fact Sheet for Patients: BloggerCourse.com  Fact Sheet for Healthcare Providers: SeriousBroker.it  This test is not yet approved or cleared by the [place] FDA and has been authorized for detection and/or diagnosis of SARS-CoV-2 by FDA under an Emergency  Use Authorization (EUA). This EUA will remain in effect (meaning this test can be used) for the duration of the COVID-19 declaration under Section 564(b)(1) of the Act, 21 U.S.C. section 360bbb-3(b)(1), unless the authorization is terminated or revoked.  Performed at [facility], [address] [place], [place] [address]   Blood Culture (routine x 2)     Status: None   Collection Time: 12/21/23  5:19 PM   Specimen: BLOOD  Result Value Ref Range Status   Specimen Description BLOOD BLOOD RIGHT HAND  Final   Special Requests   Final    BOTTLES DRAWN AEROBIC ONLY Blood Culture adequate volume   Culture   Final    NO GROWTH 5 DAYS Performed at [facility], [address], [place], [place] [address]    Report Status 12/26/2023 FINAL  Final  Blood Culture (routine x 2)     Status: None   Collection Time: 12/21/23  5:20 PM   Specimen: BLOOD  Result Value Ref Range Status   Specimen Description BLOOD BLOOD RIGHT ARM  Final   Special Requests   Final    BOTTLES DRAWN AEROBIC AND ANAEROBIC Blood Culture adequate volume   Culture   Final    NO GROWTH 5 DAYS Performed at [facility], [address], [place], [place] [address]    Report Status 12/26/2023 FINAL  Final  Respiratory (~20 pathogens) panel by PCR     Status: None   Collection Time: 12/22/23  2:55 PM   Specimen: Nasopharyngeal Swab; Respiratory  Result Value Ref Range Status   Adenovirus NOT DETECTED NOT DETECTED Final   Coronavirus 229E NOT DETECTED NOT DETECTED Final    Comment: (NOTE) The Coronavirus on the Respiratory Panel, DOES NOT test for the novel  Coronavirus (2019 nCoV)    Coronavirus HKU1 NOT DETECTED NOT DETECTED Final   Coronavirus NL63 NOT DETECTED NOT DETECTED Final   Coronavirus OC43 NOT DETECTED NOT DETECTED Final   Metapneumovirus NOT DETECTED NOT DETECTED Final   Rhinovirus / Enterovirus NOT DETECTED NOT DETECTED Final   Influenza A NOT DETECTED NOT DETECTED Final    Influenza B NOT DETECTED NOT DETECTED Final   Parainfluenza Virus 1 NOT DETECTED NOT DETECTED Final   Parainfluenza Virus 2 NOT DETECTED NOT DETECTED Final   Parainfluenza Virus 3 NOT DETECTED NOT DETECTED Final   Parainfluenza Virus 4 NOT DETECTED NOT DETECTED Final   Respiratory Syncytial Virus NOT DETECTED NOT DETECTED Final   Bordetella pertussis NOT DETECTED NOT DETECTED Final   Bordetella Parapertussis NOT DETECTED NOT DETECTED Final   Chlamydophila pneumoniae NOT DETECTED NOT DETECTED Final   Mycoplasma pneumoniae NOT DETECTED NOT DETECTED Final    Comment: Performed at [facility], [address] [place], [place] [address]  MRSA Next Gen by PCR, Nasal     Status: None   Collection Time: 12/23/23  9:05 AM   Specimen: Nasal Mucosa; Nasal Swab  Result Value Ref Range Status   MRSA by PCR Next Gen NOT DETECTED NOT DETECTED Final    Comment: (NOTE) The GeneXpert MRSA Assay (FDA approved for NASAL specimens only), is one component of a comprehensive MRSA colonization surveillance program. It is not intended to diagnose MRSA infection nor to guide or monitor treatment for MRSA infections. Test performance is not FDA approved in patients less than 58 years old. Performed at [facility], [address] [place], [place] [address]     Coagulation Studies: No results for input(s): "LABPROT", "INR" in the last 72 hours.  Urinalysis: No results for input(s): "COLORURINE", "LABSPEC", "PHURINE", "GLUCOSEU", "HGBUR", "BILIRUBINUR", "KETONESUR", "PROTEINUR", "UROBILINOGEN", "NITRITE", "LEUKOCYTESUR" in the last 72 hours.  Invalid input(s): "APPERANCEUR"    Imaging: No results found.   Medications:    sodium chloride 10 mL/hr at 12/31/23 1111    ceFAZolin (ANCEF) IV     magnesium sulfate bolus IVPB       [MAR Hold] amLODipine  5 mg Oral Daily   [MAR Hold] apixaban  2.5 mg Oral BID   [MAR Hold] calcium-vitamin D  1 tablet Oral Daily   [MAR Hold] Chlorhexidine  Gluconate Cloth  6 each Topical Q0600   [MAR Hold] cyanocobalamin  500 mcg Oral Daily   [MAR Hold] epoetin alfa-epbx (RETACRIT) injection  4,000 Units Intravenous Q M,W,F-1800   [MAR Hold] fluticasone furoate-vilanterol  1 puff Inhalation Daily   [MAR Hold] folic acid  1 mg Oral Daily   [MAR Hold] guaiFENesin  600 mg Oral BID   [MAR Hold] hydrALAZINE  100 mg Oral TID   [MAR Hold] levothyroxine  150 mcg Oral Q0600   [MAR Hold] metoprolol tartrate  50 mg Oral BID   [MAR Hold] montelukast  10 mg Oral QHS   [MAR Hold] pantoprazole  40 mg Oral Daily   [  MAR Hold] rosuvastatin  10 mg Oral Daily   [MAR Hold] sertraline  100 mg Oral Daily   [MAR Hold] acetaminophen, [MAR Hold] albuterol, [MAR Hold] bisacodyl, [MAR Hold] chlorpheniramine-HYDROcodone, diphenhydrAMINE, [MAR Hold] docusate sodium, famotidine, fentaNYL (SUBLIMAZE) injection, fentaNYL, methylPREDNISolone (SOLU-MEDROL) injection, midazolam, midazolam, [MAR Hold] ondansetron **OR** [MAR Hold] ondansetron (ZOFRAN) IV, [MAR Hold] sodium chloride, [MAR Hold] traZODone  Assessment/ Plan:  [name] is a 83 y.o.  female with past medical conditions including diastolic heart failure, hypertension, hypothyroidism, COPD, and chronic kidney disease stage IV.   End-stage renal disease requiring hemodialysis.  Patient was originally scheduled to be set up outpatient however became symptomatic.   Last dialysis treatment received on Thursday.  Patient continues to have adequate urine output.  Appreciate vascular procedure performing angiogram later today.  Dialysis coordinator has confirmed outpatient dialysis at [facility] on a MWF schedule, start date 01/02/24.  If stable postprocedure, patient cleared to discharge and continue dialysis at assigned outpatient clinic.  2.  Acute respiratory failure, workup in progress for community-acquired pneumonia.  Chest x-ray shows vascular shin and trace edema with small pleural effusions, left greater  than right.  Primary team has received IV Rocephin and azithromycin.  Remains on room air with no signs of distress.  3. Anemia of chronic kidney diseas Lab Results  Component Value Date   HGB 8.4 (L) 12/31/2023    Hemoglobin slightly decreased.  Continue low dose Epogen with dialysis.Marland Kitchen  4. Secondary Hyperparathyroidism: with outpatient labs: PTH 12, phosphorus 6.5, calcium 10 on 12/13/2023.   Lab Results  Component Value Date   CALCIUM 9.1 12/31/2023   PHOS 2.4 (L) 12/29/2023    Bone minerals acceptable.  Mild hypophosphatemia noted.  Will monitor for now.  5.  Hypertension with chronic kidney disease.  Home regimen includes amlodipine, furosemide, hydralazine, and metoprolol.  Furosemide held.   LOS: 10 [name] 3/31/202512:04 PM

## 2023-12-31 NOTE — Plan of Care (Signed)
  Problem: Respiratory: Goal: Ability to maintain adequate ventilation will improve Outcome: Progressing   Problem: Health Behavior/Discharge Planning: Goal: Ability to manage health-related needs will improve Outcome: Progressing   Problem: Nutrition: Goal: Adequate nutrition will be maintained Outcome: Progressing   Problem: Elimination: Goal: Will not experience complications related to bowel motility Outcome: Progressing

## 2024-01-01 ENCOUNTER — Encounter: Payer: Self-pay | Admitting: Vascular Surgery

## 2024-01-01 DIAGNOSIS — Z9889 Other specified postprocedural states: Secondary | ICD-10-CM

## 2024-01-01 MED ORDER — FOLIC ACID 1 MG PO TABS: 1.0000 mg | ORAL_TABLET | Freq: Every day | ORAL | Status: AC

## 2024-01-01 MED ORDER — BENZONATATE 100 MG PO CAPS: 200.0000 mg | ORAL_CAPSULE | Freq: Three times a day (TID) | ORAL | Status: DC | PRN

## 2024-01-01 MED ORDER — EPOETIN ALFA-EPBX 4000 UNIT/ML IJ SOLN: 4000.0000 [IU] | INTRAMUSCULAR | Status: AC

## 2024-01-01 MED ORDER — MONTELUKAST SODIUM 10 MG PO TABS: 10.0000 mg | ORAL_TABLET | Freq: Every day | ORAL | 2 refills | Status: DC

## 2024-01-01 NOTE — Plan of Care (Signed)

## 2024-01-01 NOTE — TOC Transition Note (Addendum)
 Transition of Care [facility]) - Discharge Note   Patient Details  Name: [name] MRN: [id number] Date of Birth: 1941/05/26  Transition of [facility]) CM/SW Contact:  Truddie Hidden, RN Phone Number: 01/01/2024, 11:47 AM   Clinical Narrative:     Spoke with patient regarding HH. She was advised HH was arranged with [facility] and they would be in contact with her within 48 hours of dicsharge to schedule her SOC. Patient  reports not having reliable transportation for dialysis. She was advised resources would be provided for transportation.  Discharge notification sent to [facility] from [facility] for transportation added to AVS.  1:45pm Spoke with patient's niece, Lelon Mast to advised resources for transportation have been added to the AVS. She was also advised the Child psychotherapist at [employer] may be able to assist with transportation.   TOC signing off.          Patient Goals and CMS Choice            Discharge Placement                       Discharge Plan and Services Additional resources added to the After Visit Summary for                                       Social Drivers of Health (SDOH) Interventions SDOH Screenings   Food Insecurity: No Food Insecurity (12/22/2023)  Housing: Low Risk  (12/22/2023)  Transportation Needs: No Transportation Needs (12/22/2023)  Utilities: Not At Risk (12/22/2023)  Depression (PHQ2-9): Low Risk  (08/14/2023)  Financial Resource Strain: Low Risk  (06/12/2023)  Physical Activity: Insufficiently Active (06/12/2023)  Social Connections: Moderately Isolated (12/22/2023)  Stress: No Stress Concern Present (06/12/2023)  Tobacco Use: Low Risk  (12/21/2023)     Readmission Risk Interventions    07/30/2023   10:37 AM 04/19/2023    1:10 PM 03/27/2023   12:56 PM  Readmission Risk Prevention Plan  Transportation Screening Complete Complete Complete  Medication Review Oceanographer) Complete Complete Complete  PCP or  Specialist appointment within 3-5 days of discharge Complete  Complete  HRI or Home Care Consult Complete Complete Complete  SW Recovery Care/Counseling Consult Complete Complete Complete  Palliative Care Screening Not Applicable Not Applicable Not Applicable  Skilled Nursing Facility Not Applicable Not Applicable Not Applicable

## 2024-01-01 NOTE — Progress Notes (Signed)
 Progress Note    01/01/2024 11:12 AM 1 Day Post-Op  Subjective:  [name] is an 83 yo female now POD #1 from left upper extremity fistulagram with angioplasty and dialysis perma catheter placement to left chest. Patient is resting comfortably in bed. Patient endorses left arm is sore with little pain but tolerable. No complaints overnight. Vitals all remain stable.    Vitals:   01/01/24 0323 01/01/24 0817  BP: (!) 146/46 (!) 151/45  Pulse: 60 (!) 53  Resp: (!) 24   Temp: 97.7 F (36.5 C) 97.7 F (36.5 C)  SpO2: 98% 98%   Physical Exam: Cardiac:  RRR, Normal S1, S2. No murmurs Lungs:  Clear throughout on auscultation, non labored, no rales rhonchi or wheezing.  Incisions:  None Bandaid to left upper extremity puncture site.  Extremities:  Palpable pulses throughout. No edema. Left A/V Fistula site with erythema and without edema. Palpable Fistula pulse with thrill and bruit.  Abdomen:  Positive bowel sounds throughout, soft, non tender and non distended.  Neurologic: AAOX4 answers all questions and follows commands.   CBC    Component Value Date/Time   WBC 7.8 12/31/2023 0454   RBC 3.01 (L) 12/31/2023 0454   HGB 8.4 (L) 12/31/2023 0454   HGB 9.4 (L) 11/27/2023 1322   HGB 15.2 06/20/2012 1353   HCT 25.5 (L) 12/31/2023 0454   HCT 43.6 06/20/2012 1353   PLT 209 12/31/2023 0454   PLT 193 11/27/2023 1322   PLT 174 06/20/2012 1353   MCV 84.7 12/31/2023 0454   MCV 83 06/20/2012 1353   MCH 27.9 12/31/2023 0454   MCHC 32.9 12/31/2023 0454   RDW 16.7 (H) 12/31/2023 0454   RDW 13.7 08/11/2014 0850   RDW 13.7 06/20/2012 1353   LYMPHSABS 0.6 (L) 12/22/2023 0403   LYMPHSABS 2.4 08/11/2014 0850   MONOABS 0.2 12/22/2023 0403   EOSABS 0.1 12/22/2023 0403   EOSABS 0.4 08/11/2014 0850   BASOSABS 0.0 12/22/2023 0403   BASOSABS 0.1 08/11/2014 0850    BMET    Component Value Date/Time   NA 135 12/31/2023 0454   NA 141 08/11/2014 0850   NA 142 06/20/2012 1353   K 4.4  12/31/2023 0454   K 3.2 (L) 06/20/2012 1353   CL 101 12/31/2023 0454   CL 103 06/20/2012 1353   CO2 22 12/31/2023 0454   CO2 29 06/20/2012 1353   GLUCOSE 85 12/31/2023 0454   GLUCOSE 84 06/20/2012 1353   BUN 81 (H) 12/31/2023 0454   BUN 16 08/11/2014 0850   BUN 13 06/20/2012 1353   CREATININE 3.02 (H) 12/31/2023 0454   CREATININE 0.65 06/20/2012 1353   CALCIUM 9.1 12/31/2023 0454   CALCIUM 9.4 06/20/2012 1353   GFRNONAA 15 (L) 12/31/2023 0454   GFRNONAA >60 06/20/2012 1353   GFRAA >60 12/31/2016 0318   GFRAA >60 06/20/2012 1353    INR    Component Value Date/Time   INR 1.4 (H) 12/21/2023 2149     Intake/Output Summary (Last 24 hours) at 01/01/2024 1112 Last data filed at 01/01/2024 1059 Gross per 24 hour  Intake 360 ml  Output 1000 ml  Net -640 ml     Assessment/Plan:  83 y.o. female is s/p from left upper extremity fistulagram with angioplasty and dialysis perma catheter placement to left chest. 1 Day Post-Op   PLAN Okay per vascular Surgery to discharge to home today.  Vascular surgery recommends to NOT use the fistula for 3-4 weeks and let heal from all  the bruising. Please use patients perm cath in left chest for dialysis until then. Will schedule follow up in 3-4 weeks for fistula check and possible perm cath removal.   DVT prophylaxis:  Heparin with dialysis   Marcie Bal [facility] 01/01/2024 11:12 AM

## 2024-01-01 NOTE — Progress Notes (Signed)
 [facility]  ROUNDING NOTE   Subjective:   [name] is a 83 y.o. female with past medical conditions including diastolic heart failure, hypertension, hypothyroidism, COPD, and chronic kidney disease stage IV. Patient presents to ED with shortness of breath and has been admitted for Hypoxia [R09.02] COPD exacerbation (HCC) [J44.1] CAP (community acquired pneumonia) [J18.9]  Patient is known to our practice and receives outpatient follow-up with Dr. Cherylann Ratel.    Patient seen sitting at side of bed Remains on room air, denies shortness of breath Adequate appetite, denies nausea vomiting Lower extremity edema greatly improved from admission Voices concerns of transportation to dialysis   Objective:  Vital signs in last 24 hours:  Temp:  [97.4 F (36.3 C)-98.4 F (36.9 C)] 97.5 F (36.4 C) (04/01 1200) Pulse Rate:  [50-60] 55 (04/01 1200) Resp:  [18-24] 24 (04/01 0323) BP: (124-151)/(39-46) 130/39 (04/01 1200) SpO2:  [95 %-98 %] 96 % (04/01 1200) Weight:  [73 kg] 73 kg (04/01 0505)  Weight change: -3.571 kg Filed Weights   12/30/23 0500 12/31/23 0500 01/01/24 0505  Weight: 76.6 kg 76.6 kg 73 kg    Intake/Output: I/O last 3 completed shifts: In: -  Out: 1800 [Urine:1800]   Intake/Output this shift:  Total I/O In: 360 [P.O.:360] Out: -   Physical Exam: General: NAD  Head: Normocephalic, atraumatic. Moist oral mucosal membranes  Eyes: Anicteric  Lungs:  Diminished., normal effort.   Heart: Regular rate and rhythm  Abdomen:  Soft, nontender  Extremities:  No peripheral edema.  Neurologic: Alert, moving all four extremities  Skin: No lesions  Access: Lt AVF (bruised), left IJ tunnel catheter    Basic Metabolic Panel: Recent Labs  Lab 12/27/23 0748 12/28/23 0846 12/29/23 0852 12/31/23 0454  NA 136 133* 133* 135  K 3.5 3.9 4.1 4.4  CL 101 98 99 101  CO2 24 25 24 22   GLUCOSE 87 87 89 85  BUN 78* 64* 72* 81*  CREATININE 2.41* 2.49* 2.42*  3.02*  CALCIUM 8.8* 8.6* 9.2 9.1  MG  --   --   --  1.4*  PHOS 2.6 2.4* 2.4*  --     Liver Function Tests: Recent Labs  Lab 12/27/23 0748 12/28/23 0846 12/29/23 0852  ALBUMIN 2.8* 2.9* 3.1*   No results for input(s): "LIPASE", "AMYLASE" in the last 168 hours. No results for input(s): "AMMONIA" in the last 168 hours.  CBC: Recent Labs  Lab 12/27/23 0748 12/28/23 0846 12/29/23 0852 12/31/23 0454  WBC 8.9 9.7 9.0 7.8  HGB 7.9* 8.4* 8.6* 8.4*  HCT 24.2* 26.0* 27.5* 25.5*  MCV 85.5 86.1 89.0 84.7  PLT 219 212 231 209    Cardiac Enzymes: No results for input(s): "CKTOTAL", "CKMB", "CKMBINDEX", "TROPONINI" in the last 168 hours.  BNP: Invalid input(s): "POCBNP"  CBG: No results for input(s): "GLUCAP" in the last 168 hours.  Microbiology: Results for orders placed or performed during the hospital encounter of 12/21/23  Resp panel by RT-PCR (RSV, Flu A&B, Covid) Anterior Nasal Swab     Status: None   Collection Time: 12/21/23  3:30 PM   Specimen: Anterior Nasal Swab  Result Value Ref Range Status   SARS Coronavirus 2 by RT PCR NEGATIVE NEGATIVE Final    Comment: (NOTE) SARS-CoV-2 target nucleic acids are NOT DETECTED.  The SARS-CoV-2 RNA is generally detectable in upper respiratory specimens during the acute phase of infection. The lowest concentration of SARS-CoV-2 viral copies this assay can detect is 138 copies/mL. A  negative result does not preclude SARS-Cov-2 infection and should not be used as the sole basis for treatment or other patient management decisions. A negative result may occur with  improper specimen collection/handling, submission of specimen other than nasopharyngeal swab, presence of viral mutation(s) within the areas targeted by this assay, and inadequate number of viral copies(<138 copies/mL). A negative result must be combined with clinical observations, patient history, and epidemiological information. The expected result is  Negative.  Fact Sheet for Patients:  BloggerCourse.com  Fact Sheet for Healthcare Providers:  SeriousBroker.it  This test is no t yet approved or cleared by the [place] FDA and  has been authorized for detection and/or diagnosis of SARS-CoV-2 by FDA under an Emergency Use Authorization (EUA). This EUA will remain  in effect (meaning this test can be used) for the duration of the COVID-19 declaration under Section 564(b)(1) of the Act, 21 U.S.C.section 360bbb-3(b)(1), unless the authorization is terminated  or revoked sooner.       Influenza A by PCR NEGATIVE NEGATIVE Final   Influenza B by PCR NEGATIVE NEGATIVE Final    Comment: (NOTE) The Xpert Xpress SARS-CoV-2/FLU/RSV plus assay is intended as an aid in the diagnosis of influenza from Nasopharyngeal swab specimens and should not be used as a sole basis for treatment. Nasal washings and aspirates are unacceptable for Xpert Xpress SARS-CoV-2/FLU/RSV testing.  Fact Sheet for Patients: BloggerCourse.com  Fact Sheet for Healthcare Providers: SeriousBroker.it  This test is not yet approved or cleared by the [place] FDA and has been authorized for detection and/or diagnosis of SARS-CoV-2 by FDA under an Emergency Use Authorization (EUA). This EUA will remain in effect (meaning this test can be used) for the duration of the COVID-19 declaration under Section 564(b)(1) of the Act, 21 U.S.C. section 360bbb-3(b)(1), unless the authorization is terminated or revoked.     Resp Syncytial Virus by PCR NEGATIVE NEGATIVE Final    Comment: (NOTE) Fact Sheet for Patients: BloggerCourse.com  Fact Sheet for Healthcare Providers: SeriousBroker.it  This test is not yet approved or cleared by the [place] FDA and has been authorized for detection and/or diagnosis of  SARS-CoV-2 by FDA under an Emergency Use Authorization (EUA). This EUA will remain in effect (meaning this test can be used) for the duration of the COVID-19 declaration under Section 564(b)(1) of the Act, 21 U.S.C. section 360bbb-3(b)(1), unless the authorization is terminated or revoked.  Performed at [facility], [address] [place], [place] [address]   Blood Culture (routine x 2)     Status: None   Collection Time: 12/21/23  5:19 PM   Specimen: BLOOD  Result Value Ref Range Status   Specimen Description BLOOD BLOOD RIGHT HAND  Final   Special Requests   Final    BOTTLES DRAWN AEROBIC ONLY Blood Culture adequate volume   Culture   Final    NO GROWTH 5 DAYS Performed at [facility], [address], [place], [place] [address]    Report Status 12/26/2023 FINAL  Final  Blood Culture (routine x 2)     Status: None   Collection Time: 12/21/23  5:20 PM   Specimen: BLOOD  Result Value Ref Range Status   Specimen Description BLOOD BLOOD RIGHT ARM  Final   Special Requests   Final    BOTTLES DRAWN AEROBIC AND ANAEROBIC Blood Culture adequate volume   Culture   Final    NO GROWTH 5 DAYS Performed at [facility], [address]  [place], [place] [address]    Report Status 12/26/2023 FINAL  Final  Respiratory (~20 pathogens) panel by PCR     Status: None   Collection Time: 12/22/23  2:55 PM   Specimen: Nasopharyngeal Swab; Respiratory  Result Value Ref Range Status   Adenovirus NOT DETECTED NOT DETECTED Final   Coronavirus 229E NOT DETECTED NOT DETECTED Final    Comment: (NOTE) The Coronavirus on the Respiratory Panel, DOES NOT test for the novel  Coronavirus (2019 nCoV)    Coronavirus HKU1 NOT DETECTED NOT DETECTED Final   Coronavirus NL63 NOT DETECTED NOT DETECTED Final   Coronavirus OC43 NOT DETECTED NOT DETECTED Final   Metapneumovirus NOT DETECTED NOT DETECTED Final   Rhinovirus / Enterovirus NOT DETECTED NOT DETECTED Final   Influenza  A NOT DETECTED NOT DETECTED Final   Influenza B NOT DETECTED NOT DETECTED Final   Parainfluenza Virus 1 NOT DETECTED NOT DETECTED Final   Parainfluenza Virus 2 NOT DETECTED NOT DETECTED Final   Parainfluenza Virus 3 NOT DETECTED NOT DETECTED Final   Parainfluenza Virus 4 NOT DETECTED NOT DETECTED Final   Respiratory Syncytial Virus NOT DETECTED NOT DETECTED Final   Bordetella pertussis NOT DETECTED NOT DETECTED Final   Bordetella Parapertussis NOT DETECTED NOT DETECTED Final   Chlamydophila pneumoniae NOT DETECTED NOT DETECTED Final   Mycoplasma pneumoniae NOT DETECTED NOT DETECTED Final    Comment: Performed at [facility], [address], [place], [place] [address]  MRSA Next Gen by PCR, Nasal     Status: None   Collection Time: 12/23/23  9:05 AM   Specimen: Nasal Mucosa; Nasal Swab  Result Value Ref Range Status   MRSA by PCR Next Gen NOT DETECTED NOT DETECTED Final    Comment: (NOTE) The GeneXpert MRSA Assay (FDA approved for NASAL specimens only), is one component of a comprehensive MRSA colonization surveillance program. It is not intended to diagnose MRSA infection nor to guide or monitor treatment for MRSA infections. Test performance is not FDA approved in patients less than 63 years old. Performed at [facility], [address] [place], [place] [address]     Coagulation Studies: No results for input(s): "LABPROT", "INR" in the last 72 hours.  Urinalysis: No results for input(s): "COLORURINE", "LABSPEC", "PHURINE", "GLUCOSEU", "HGBUR", "BILIRUBINUR", "KETONESUR", "PROTEINUR", "UROBILINOGEN", "NITRITE", "LEUKOCYTESUR" in the last 72 hours.  Invalid input(s): "APPERANCEUR"    Imaging: PERIPHERAL VASCULAR CATHETERIZATION Result Date: 12/31/2023 See surgical note for result.    Medications:       amLODipine  5 mg Oral Daily   apixaban  2.5 mg Oral BID   calcium-vitamin D  1 tablet Oral Daily   Chlorhexidine Gluconate Cloth  6 each Topical  Q0600   cyanocobalamin  500 mcg Oral Daily   epoetin alfa-epbx (RETACRIT) injection  4,000 Units Intravenous Q M,W,F-1800   fluticasone furoate-vilanterol  1 puff Inhalation Daily   folic acid  1 mg Oral Daily   guaiFENesin  600 mg Oral BID   hydrALAZINE  100 mg Oral TID   levothyroxine  150 mcg Oral Q0600   metoprolol tartrate  50 mg Oral BID   montelukast  10 mg Oral QHS   pantoprazole  40 mg Oral Daily   rosuvastatin  10 mg Oral Daily   sertraline  100 mg Oral Daily   acetaminophen, albuterol, bisacodyl, chlorpheniramine-HYDROcodone, docusate sodium, ondansetron **OR** ondansetron (ZOFRAN) IV, sodium chloride, traZODone  Assessment/ Plan:  [name] is a 83 y.o.  female with  past medical conditions including diastolic heart failure, hypertension, hypothyroidism, COPD, and chronic kidney disease stage IV.   End-stage renal disease requiring hemodialysis.  Patient was originally scheduled to be set up outpatient however became symptomatic.   Last dialysis treatment received on Thursday.  Appreciate vascular procedure performing angiogram and placing needed stents. Also placed left chest permcath to allow for 2 week rest of fistula.  Dialysis coordinator has confirmed outpatient dialysis at [facility] on a MWF schedule, start date 01/02/24.    2.  Acute respiratory failure, workup in progress for community-acquired pneumonia.  Chest x-ray shows vascular shin and trace edema with small pleural effusions, left greater than right.  Primary team has received IV Rocephin and azithromycin.  Remains on room air   3. Anemia of chronic kidney diseas Lab Results  Component Value Date   HGB 8.4 (L) 12/31/2023    Continue low dose Epogen with dialysis.. Will receive Mircera outpatient.   4. Secondary Hyperparathyroidism: with outpatient labs: PTH 12, phosphorus 6.5, calcium 10 on 12/13/2023.   Lab Results  Component Value Date   CALCIUM 9.1 12/31/2023   PHOS 2.4 (L) 12/29/2023     Mild hypophosphatemia noted.  Will monitor for now.  5.  Hypertension with chronic kidney disease.  Home regimen includes amlodipine, furosemide, hydralazine, and metoprolol.  Furosemide held.   LOS: 11 Jessen Siegman 4/1/20252:30 PM

## 2024-01-01 NOTE — Progress Notes (Signed)
Pt does not have a legal guardian  °

## 2024-01-01 NOTE — Care Management Important Message (Signed)
 Important Message  Patient Details  Name: [name] MRN: [id number] Date of Birth: 05/14/1941   Important Message Given:  Yes - Medicare IM     Marcell Anger 01/01/2024, 12:18 PM

## 2024-01-01 NOTE — Discharge Instructions (Signed)
 Transportation Resources  Agency Name: [facility] Agency Address: [address] [place], [place] [address] Phone: [phone] Email: [email or web address] Website: [email or web address] Service(s) Offered: Housing services, self-sufficiency, congregate meal program, weatherization program, Field seismologist program, emergency food assistance,  housing counseling, home ownership program, wheels-towork program.  Agency Name: [facility] [employer] [phone]) Address: [address], [place], [place] [address] Phone: [phone] Website: [email or web address] Service(s) Offered: Transportation for [employer], subscription and demand response; Dial-a-Ride for citizens 83 years of age or older.  Agency Name: Department [employer] Services Address: [address] [place], [place] [address] Phone: [phone] Service(s) Offered: Child support services; child welfare services; food stamps; Medicaid; work first family assistance; and aid with fuel,  rent, food and medicine, transportation assistance.  Agency Name: Disabled [employer] (DAV) Transportation  Network Phone: [phone] Service(s) Offered: Transports veterans to the [facility] medical center. Call  forty-eight hours in advance and leave the name, telephone  number, date, and time of appointment. Veteran will be  contacted by the driver the day before the appointment to  arrange a pick up point   Transportation Resources  Agency Name: [facility] Agency Address: [address] [place], [place] [address] Phone: [phone] Email: [email or web address] Website: [email or web address] Service(s) Offered: Housing services, self-sufficiency, congregate meal program, weatherization program, Field seismologist program, emergency food assistance,  housing counseling, home ownership program, wheels-towork  program.  Agency Name: [facility] [employer] [phone]) Address: [address], [place], [place] [address] Phone: [phone] Website: [email or web address] Service(s) Offered: Transportation for [employer], subscription and demand response; Dial-a-Ride for citizens 83 years of age or older.  Agency Name: Department of Social Services Address: [address] [place], [place] [address] Phone: [phone] Service(s) Offered: Child support services; child welfare services; food stamps; Medicaid; work first family assistance; and aid with fuel,  rent, food and medicine, transportation assistance.  Agency Name: Disabled [employer] (DAV) Transportation  Network Phone: [phone] Service(s) Offered: Transports veterans to the [facility] medical center. Call  forty-eight hours in advance and leave the name, telephone  number, date, and time of appointment. Veteran will be  contacted by the driver the day before the appointment to  arrange a pick up point    [employer] currently provides door to door services. ACTA connects with PART daily for services to [facility]. ACTA also performs contract services to [employer] operates 27 vehicles, all but 3 mini-vans are equipped with lifts for special needs as well as the general public. [employer] drivers are each CDL certified and trained in First Aid and CPR. [employer] was established in 2002 by [employer]. An independent Industrial/product designer. ACT[employer] operates via Cytogeneticist with required Research scientist (physical sciences) from [place]. ACT[employer] provides over 80,000 passenger trips each year, including [facility] and [employer] sites.  Call at least by 11 AM one business day prior to needing transportation  [employer].                      [place], [place] [address]     Office  Hours: Monday-Friday  8 AM - 5 PM

## 2024-01-01 NOTE — Progress Notes (Signed)
 Physical Therapy Treatment Patient Details Name: [name] MRN: [id number] DOB: 1941/05/23 Today's Date: 01/01/2024   History of Present Illness Pt is an 83 year old female presents to the ED with productive cough over the last few days with associated chest congestion, fever, as well as chills, Admitted with CAP, COPD exacerbation, Acute respiratory failure with hypoxia, HFpEF exacerbation     PMH significant for asthma, osteoarthritis CHF, type 2 diabetes mellitus, GERD, hypertension, dyslipidemia, hypothyroidism, OSA, stage IV chronic kidney disease, and hypothyroidism    PT Comments  Pt sleepy today, agreeable to session, able to push AMB distance to >348ft, but legs begin to tire. Pt very happy to be DC to home later today, reports confidence in managing her own mobility safely upon DC. Pt in bed at end of session, CPAP brought close by as requested to help prepare for a nap.     If plan is discharge home, recommend the following: Assist for transportation;Assistance with cooking/housework   Can travel by private vehicle        Equipment Recommendations  None recommended by PT    Recommendations for Other Services       Precautions / Restrictions Precautions Precautions: Fall Recall of Precautions/Restrictions: Intact Restrictions Weight Bearing Restrictions Per Provider Order: No     Mobility  Bed Mobility Overal bed mobility: Modified Independent Bed Mobility: Supine to Sit, Sit to Supine     Supine to sit: Modified independent (Device/Increase time) Sit to supine: Modified independent (Device/Increase time)        Transfers Overall transfer level: Needs assistance Equipment used: None Transfers: Sit to/from Stand Sit to Stand: Supervision           General transfer comment: no LOB    Ambulation/Gait Ambulation/Gait assistance: Contact guard assist, Supervision Gait Distance (Feet): 340 Feet Assistive device: Rolling walker (2 wheels) Gait  Pattern/deviations: WFL(Within Functional Limits)       General Gait Details: legs beginnig to feel weak over last 38ft.   Stairs             Wheelchair Mobility     Tilt Bed    Modified [name] (Stroke Patients Only)       Balance                                            Communication    Cognition Arousal: Alert Behavior During Therapy: WFL for tasks assessed/performed   PT - Cognitive impairments: No apparent impairments                                Cueing    Exercises      General Comments        Pertinent Vitals/Pain Pain Assessment Pain Assessment: No/denies pain    Home Living                          Prior Function            PT Goals (current goals can now be found in the care plan section) Acute Rehab PT Goals Patient Stated Goal: get better so I can get home Progress towards PT goals: Progressing toward goals    Frequency    Min 1X/week      PT Plan  Co-evaluation              AM-PAC PT "6 Clicks" Mobility   Outcome Measure  Help needed turning from your back to your side while in a flat bed without using bedrails?: None Help needed moving from lying on your back to sitting on the side of a flat bed without using bedrails?: None Help needed moving to and from a bed to a chair (including a wheelchair)?: None Help needed standing up from a chair using your arms (e.g., wheelchair or bedside chair)?: None Help needed to walk in hospital room?: A Little Help needed climbing 3-5 steps with a railing? : A Little 6 Click Score: 22    End of Session Equipment Utilized During Treatment: Gait belt Activity Tolerance: Patient tolerated treatment well;No increased pain;Patient limited by fatigue Patient left: with call bell/phone within reach;in bed Nurse Communication: Mobility status PT Visit Diagnosis: Muscle weakness (generalized) (M62.81);Difficulty in walking, not  elsewhere classified (R26.2)     Time: 6433-2951 PT Time Calculation (min) (ACUTE ONLY): 10 min  Charges:    $Therapeutic Activity: 8-22 mins PT General Charges $$ ACUTE PT VISIT: 1 Visit                    11:50 AM, 01/01/24 Rosamaria Lints, PT, DPT Physical Therapist - [facility]  [phone] (ASCOM)    [name] 01/01/2024, 11:49 AM

## 2024-01-01 NOTE — Discharge Summary (Signed)
 Physician Discharge Summary   [name]  female DOB: 1940/12/13  ZOX:[id number]  PCP: Patient, No Pcp Per  Admit date: 12/21/2023 Discharge date: 01/01/2024  Admitted From: home Disposition:  home Niece updated on the phone prior to discharge. Home Health: Yes CODE STATUS: DNR  Discharge Instructions     No wound care   Complete by: As directed       Hospital Course:  For full details, please see H&P, progress notes, consult notes and ancillary notes.  Briefly,  [name] is a 83 y.o. female with medical history significant for asthma, CHF, type 2 diabetes mellitus, hypertension, OSA, stage IV chronic kidney disease, who presented to the emergency room with productive cough over the last few days with associated chest congestion, fever, as well as chills.     * CAP (community acquired pneumonia) Patient presented with productive cough and fever. She had new oxygen requirement and was afebrile at the hospital. She had no leukocytosis with elevated procalcitonin. CXR with no focal consolidation but sm pleural effusions and vascular congestion.  Completed 5 days of ceftriaxone and 3 days of azithro.   COPD exacerbation (HCC) Patient with recent PFTs, with FEV1/FVC ratio normal, FEV1 and FVC normal. She had no significant bronchodilator response. She did however note improvement in her symptoms with broncho dilators. Her pulmonologist felt she could have mild obstructive airway disease.  --completed steroid burst --cont home bronchodilator   Acute respiratory failure with hypoxia (HCC) O2 sats 88% on room air on presentation, and needed 2-4L O2.  weaned down to RA prior to discharge.   Acute on chronic HFpEF exacerbation  Clinical and laboratory evidence of volume overload.  Patient had pretty much normal echocardiogram 6 months ago. --HD for volume removal --home lasix held due to Cr increase, to be resumed after discharge per nephro.   CKD stage 4 now ESRD Due to  worsening creatinine despite holding Lasix nephrology was consulted and patient was started on dialysis.  Pt already had a fistula in place in the preparation as outpatient, however, it malfunctioned, so PermCath inserted.   --outpatient dialysis placement set up.  TOC requested to give information on transportation assistance.   AV fistula malfunction Unable to dialyze due to venous infiltrate --Vascular performed angioplasty and stent placement.     Normocytic anemia  No obvious evidence of bleeding.  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   -Anemia panel with borderline folic acid --started on folic acid suppl    Thrombocytopenia -mild  --normalized prior to discharge.  Hypokalemia --corrected with dialysis   Essential hypertension --cont amlodipine, hydralazine, Lopressor --home lasix held due to Cr increase, to be resumed after discharge per nephro.   Depression --cont zoloft   GERD without esophagitis --cont PPI   Paroxysmal atrial fibrillation (HCC) In sinus rhythm.   --cont lopressor and Eliquis   Dyslipidemia Continue statin therapy   Hypothyroidism --cont Synthroid   Hypomag --monitored and supplemented PRN   Discharge Diagnoses:  Principal Problem:   CAP (community acquired pneumonia) Active Problems:   COPD exacerbation (HCC)   Acute respiratory failure with hypoxia (HCC)   Hypokalemia   Essential hypertension   Depression   Hypothyroidism   Dyslipidemia   Paroxysmal atrial fibrillation (HCC)   GERD without esophagitis   ESRD on dialysis (HCC)   30 Day Unplanned Readmission Risk Score    Flowsheet Row ED to Hosp-Admission (Current) from 12/21/2023 in [facility]  30 Day Unplanned Readmission  Risk Score (%) 45.23 Filed at 01/01/2024 0802       This score is the patient's risk of an unplanned readmission within 30 days of being discharged (0 -100%). The score is based on dignosis, age, lab data, medications, orders, and  past utilization.   Low:  0-14.9   Medium: 15-21.9   High: 22-29.9   Extreme: 30 and above         Discharge Instructions:  Allergies as of 01/01/2024       Reactions   Ace Inhibitors    Other reaction(s): Unknown   Egg-derived Products Diarrhea   Other    Other reaction(s): Other (See Comments) Eggs   Prednisone    Other reaction(s): Other (See Comments) joint pain   Risedronate    Other reaction(s): Other (See Comments)   Sulfa Antibiotics Itching, Swelling   Other reaction(s): Other (See Comments)   Sulfasalazine Other (See Comments)        Medication List     STOP taking these medications    fluticasone-salmeterol 500-50 MCG/ACT Aepb Commonly known as: ADVAIR   potassium chloride 10 MEQ tablet Commonly known as: KLOR-CON       TAKE these medications    acetaminophen 500 MG tablet Commonly known as: TYLENOL Take 1,000 mg by mouth every 6 (six) hours as needed for mild pain or moderate pain.   albuterol 108 (90 Base) MCG/ACT inhaler Commonly known as: VENTOLIN HFA Inhale 2 puffs into the lungs every 6 (six) hours as needed for wheezing or shortness of breath.   allopurinol 100 MG tablet Commonly known as: ZYLOPRIM Take 100 mg by mouth daily.   amLODipine 5 MG tablet Commonly known as: NORVASC Take 1 tablet (5 mg total) by mouth daily.   apixaban 2.5 MG Tabs tablet Commonly known as: ELIQUIS Take 1 tablet (2.5 mg total) by mouth 2 (two) times daily.   azelastine 0.1 % nasal spray Commonly known as: ASTELIN Place 2 sprays into both nostrils 2 (two) times daily. Use in each nostril as directed   benzonatate 100 MG capsule Commonly known as: TESSALON Take 2 capsules (200 mg total) by mouth 3 (three) times daily as needed for cough. Home med. What changed:  when to take this reasons to take this additional instructions   CALCIUM & VIT D3 BONE HEALTH PO Take 1 tablet by mouth daily. 600 mg/ 25 mg   epoetin alfa-epbx 4000 UNIT/ML  injection Commonly known as: RETACRIT Inject 1 mL (4,000 Units total) into the vein every Monday, Wednesday, and Friday at 6 PM. Start taking on: January 02, 2024   esomeprazole 40 MG capsule Commonly known as: NEXIUM Take 1 capsule (40 mg total) by mouth daily before breakfast.   ferrous sulfate 325 (65 FE) MG tablet Take 325 mg by mouth daily with breakfast.   fluticasone 50 MCG/ACT nasal spray Commonly known as: FLONASE Place 2 sprays into both nostrils daily.   fluticasone furoate-vilanterol 200-25 MCG/ACT Aepb Commonly known as: Breo Ellipta Inhale 1 puff into the lungs daily.   folic acid 1 MG tablet Commonly known as: FOLVITE Take 1 tablet (1 mg total) by mouth daily.   furosemide 40 MG tablet Commonly known as: LASIX Take 1 tablet (40 mg total) by mouth 2 (two) times daily.   hydrALAZINE 100 MG tablet Commonly known as: APRESOLINE Take 1 tablet (100 mg total) by mouth 3 (three) times daily.   ipratropium 0.06 % nasal spray Commonly known as: ATROVENT Place 2 sprays into both  nostrils 4 (four) times daily.   ipratropium-albuterol 0.5-2.5 (3) MG/3ML Soln Commonly known as: DUONEB Take 3 mLs by nebulization every 6 (six) hours as needed (SOB).   levothyroxine 75 MCG tablet Commonly known as: SYNTHROID Take 75 mcg by mouth daily.   loperamide 2 MG capsule Commonly known as: IMODIUM Take 1 capsule (2 mg total) by mouth as needed for diarrhea or loose stools.   metoprolol tartrate 50 MG tablet Commonly known as: LOPRESSOR Take 25 mg by mouth 2 (two) times daily.   montelukast 10 MG tablet Commonly known as: SINGULAIR Take 1 tablet (10 mg total) by mouth at bedtime.   promethazine-dextromethorphan 6.25-15 MG/5ML syrup Commonly known as: PROMETHAZINE-DM Take 5 mLs by mouth 4 (four) times daily as needed.   rosuvastatin 40 MG tablet Commonly known as: CRESTOR Take 40 mg by mouth daily.   senna-docusate 8.6-50 MG tablet Commonly known as: Senokot-S Take 2  tablets by mouth at bedtime as needed for mild constipation.   sertraline 100 MG tablet Commonly known as: ZOLOFT Take 100 mg by mouth daily.   vitamin B-12 500 MCG tablet Commonly known as: CYANOCOBALAMIN Take 500 mcg by mouth daily.         Follow-up Information     Your primary care doctor Follow up.                  Allergies  Allergen Reactions   Ace Inhibitors     Other reaction(s): Unknown   Egg-Derived Products Diarrhea   Other     Other reaction(s): Other (See Comments) Eggs   Prednisone     Other reaction(s): Other (See Comments) joint pain   Risedronate     Other reaction(s): Other (See Comments)   Sulfa Antibiotics Itching and Swelling    Other reaction(s): Other (See Comments)   Sulfasalazine Other (See Comments)     The results of significant diagnostics from this hospitalization (including imaging, microbiology, ancillary and laboratory) are listed below for reference.   Consultations:   Procedures/Studies: PERIPHERAL VASCULAR CATHETERIZATION Result Date: 12/31/2023 See surgical note for result.  DG Chest 2 View Result Date: 12/21/2023 CLINICAL DATA:  Shortness of breath.  Cough since March 14th. EXAM: CHEST - 2 VIEW COMPARISON:  X-ray 10/27/2023 and older FINDINGS: Enlarged cardiopericardial silhouette with vascular congestion. Some interstitial edema suggested. Small bilateral pleural effusions are seen, left greater than right. No pneumothorax. Film is under penetrated. Overlapping cardiac leads. IMPRESSION: Enlarged cardiopericardial silhouette with vascular shin and trace edema. Small pleural effusions, left greater than right. Electronically Signed   By: Karen Kays M.D.   On: 12/21/2023 17:19   VAS [place] ABI WITH/WO TBI Result Date: 12/07/2023  LOWER EXTREMITY DOPPLER STUDY Patient Name:  [name]  Date of Exam:   12/06/2023 Medical Rec #: [id number]      Accession #:    [id number] Date of Birth: 01-12-1941      Patient Gender: F Patient  Age:   84 years Exam Location:  [facility] Procedure:      VAS [place] ABI WITH/WO TBI Referring Phys: Levora Dredge --------------------------------------------------------------------------------  Indications: Ulceration.  Performing Technologist: Debbe Bales RVS  Examination Guidelines: A complete evaluation includes at minimum, Doppler waveform signals and systolic blood pressure reading at the level of bilateral brachial, anterior tibial, and posterior tibial arteries, when vessel segments are accessible. Bilateral testing is considered an integral part of a complete examination. Photoelectric Plethysmograph (PPG) waveforms and toe systolic pressure readings are included  as required and additional duplex testing as needed. Limited examinations for reoccurring indications may be performed as noted.  ABI Findings: +---------+------------------+-----+---------+--------+ Right    Rt Pressure (mmHg)IndexWaveform Comment  +---------+------------------+-----+---------+--------+ Brachial 174                                      +---------+------------------+-----+---------+--------+ ATA      210               1.21 triphasic         +---------+------------------+-----+---------+--------+ PTA      211               1.21 triphasic         +---------+------------------+-----+---------+--------+ Great Toe153               0.88 Normal            +---------+------------------+-----+---------+--------+ +---------+------------------+-----+---------+-------+ Left     Lt Pressure (mmHg)IndexWaveform Comment +---------+------------------+-----+---------+-------+ ATA      238               1.37 triphasic        +---------+------------------+-----+---------+-------+ PTA      231               1.33 triphasic        +---------+------------------+-----+---------+-------+ Great Toe210               1.21 Normal            +---------+------------------+-----+---------+-------+ +-------+-----------+-----------+------------+------------+ ABI/TBIToday's ABIToday's TBIPrevious ABIPrevious TBI +-------+-----------+-----------+------------+------------+ Right  1.21       .88                                 +-------+-----------+-----------+------------+------------+ Left   1.37       1.21                                +-------+-----------+-----------+------------+------------+  Summary: Right: Resting right ankle-brachial index is within normal range. The right toe-brachial index is normal. Left: Resting left ankle-brachial index is within normal range. The left toe-brachial index is normal. *See table(s) above for measurements and observations.  Electronically signed by Levora Dredge MD on 12/07/2023 at 8:32:35 AM.    Final    VAS US DUPLEX DIALYSIS ACCESS (AVF, AVG) Result Date: 12/07/2023 DIALYSIS ACCESS Patient Name:  [name] [facility]  Date of Exam:   12/06/2023 Medical Rec #: [id number]      Accession #:    [id number] Date of Birth: 02/18/1941      Patient Gender: F Patient Age:   76 years Exam Location:  [facility] Procedure:      VAS US DUPLEX DIALYSIS ACCESS (AVF, AVG) Referring Phys: Levora Dredge --------------------------------------------------------------------------------  Access Site: Left Upper Extremity. Access Type: Brachial-cephalic AVF. History: Fistula is not currently being used. Comparison Study: 05/28/2023 Performing Technologist: Debbe Bales RVS  Examination Guidelines: A complete evaluation includes B-mode imaging, spectral Doppler, color Doppler, and power Doppler as needed of all accessible portions of each vessel. Unilateral testing is considered an integral part of a complete examination. Limited examinations for reoccurring indications may be performed as noted.  Findings: +--------------------+----------+-----------------+--------+ AVF                 PSV (cm/s)Flow Vol  (mL/min)Comments +--------------------+----------+-----------------+--------+  Native artery inflow   238          1544                +--------------------+----------+-----------------+--------+ AVF Anastomosis        438                              +--------------------+----------+-----------------+--------+  +---------------+----------+-------------+----------+--------+ OUTFLOW VEIN   PSV (cm/s)Diameter (cm)Depth (cm)Describe +---------------+----------+-------------+----------+--------+ Subclavian vein   211                                    +---------------+----------+-------------+----------+--------+ Confluence        368                                    +---------------+----------+-------------+----------+--------+ Shoulder          216                                    +---------------+----------+-------------+----------+--------+ Prox UA           346                                    +---------------+----------+-------------+----------+--------+ Mid UA            189                                    +---------------+----------+-------------+----------+--------+ Dist UA           322                                    +---------------+----------+-------------+----------+--------+  +--------------+-------------+---------+---------+---------+-------------------+               Diameter (cm)  Depth  Branching   PSV       Flow Volume                                  (cm)             (cm/s)       (ml/min)       +--------------+-------------+---------+---------+---------+-------------------+ Lt Rad Art                                      93                        Dist                                                                      +--------------+-------------+---------+---------+---------+-------------------+  Summary: The Left Brachial Cephalic AVF  appears to be patent throughout; Flow Volume appears to be Normal as  Well.  *See table(s) above for measurements and observations.  Diagnosing physician: Levora Dredge MD Electronically signed by Levora Dredge MD on 12/07/2023 at 8:32:30 AM.   --------------------------------------------------------------------------------   Final       Labs: BNP (last 3 results) Recent Labs    06/01/23 1300 07/29/23 2324 12/22/23 0403  BNP 558.8* 293.4* 1,766.6*   Basic Metabolic Panel: Recent Labs  Lab 12/27/23 0748 12/28/23 0846 12/29/23 0852 12/31/23 0454  NA 136 133* 133* 135  K 3.5 3.9 4.1 4.4  CL 101 98 99 101  CO2 24 25 24 22   GLUCOSE 87 87 89 85  BUN 78* 64* 72* 81*  CREATININE 2.41* 2.49* 2.42* 3.02*  CALCIUM 8.8* 8.6* 9.2 9.1  MG  --   --   --  1.4*  PHOS 2.6 2.4* 2.4*  --    Liver Function Tests: Recent Labs  Lab 12/27/23 0748 12/28/23 0846 12/29/23 0852  ALBUMIN 2.8* 2.9* 3.1*   No results for input(s): "LIPASE", "AMYLASE" in the last 168 hours. No results for input(s): "AMMONIA" in the last 168 hours. CBC: Recent Labs  Lab 12/27/23 0748 12/28/23 0846 12/29/23 0852 12/31/23 0454  WBC 8.9 9.7 9.0 7.8  HGB 7.9* 8.4* 8.6* 8.4*  HCT 24.2* 26.0* 27.5* 25.5*  MCV 85.5 86.1 89.0 84.7  PLT 219 212 231 209   Cardiac Enzymes: No results for input(s): "CKTOTAL", "CKMB", "CKMBINDEX", "TROPONINI" in the last 168 hours. BNP: Invalid input(s): "POCBNP" CBG: No results for input(s): "GLUCAP" in the last 168 hours. D-Dimer No results for input(s): "DDIMER" in the last 72 hours. Hgb A1c No results for input(s): "HGBA1C" in the last 72 hours. Lipid Profile No results for input(s): "CHOL", "HDL", "LDLCALC", "TRIG", "CHOLHDL", "LDLDIRECT" in the last 72 hours. Thyroid function studies No results for input(s): "TSH", "T4TOTAL", "T3FREE", "THYROIDAB" in the last 72 hours.  Invalid input(s): "FREET3" Anemia work up No results for input(s): "VITAMINB12", "FOLATE", "FERRITIN", "TIBC", "IRON", "RETICCTPCT" in the last 72 hours. Urinalysis     Component Value Date/Time   COLORURINE YELLOW (A) 09/24/2023 1050   APPEARANCEUR CLOUDY (A) 09/24/2023 1050   APPEARANCEUR Clear 09/17/2018 1028   LABSPEC 1.012 09/24/2023 1050   LABSPEC 1.013 06/20/2012 1508   PHURINE 5.0 09/24/2023 1050   GLUCOSEU NEGATIVE 09/24/2023 1050   GLUCOSEU Negative 06/20/2012 1508   HGBUR SMALL (A) 09/24/2023 1050   BILIRUBINUR NEGATIVE 09/24/2023 1050   BILIRUBINUR Negative 09/17/2018 1028   BILIRUBINUR Negative 06/20/2012 1508   KETONESUR NEGATIVE 09/24/2023 1050   PROTEINUR 100 (A) 09/24/2023 1050   NITRITE NEGATIVE 09/24/2023 1050   LEUKOCYTESUR LARGE (A) 09/24/2023 1050   LEUKOCYTESUR Negative 06/20/2012 1508   Sepsis Labs Recent Labs  Lab 12/27/23 0748 12/28/23 0846 12/29/23 0852 12/31/23 0454  WBC 8.9 9.7 9.0 7.8   Microbiology Recent Results (from the past 240 hours)  Respiratory (~20 pathogens) panel by PCR     Status: None   Collection Time: 12/22/23  2:55 PM   Specimen: Nasopharyngeal Swab; Respiratory  Result Value Ref Range Status   Adenovirus NOT DETECTED NOT DETECTED Final   Coronavirus 229E NOT DETECTED NOT DETECTED Final    Comment: (NOTE) The Coronavirus on the Respiratory Panel, DOES NOT test for the novel  Coronavirus (2019 nCoV)    Coronavirus HKU1 NOT DETECTED NOT DETECTED Final   Coronavirus NL63 NOT DETECTED NOT DETECTED Final   Coronavirus OC43 NOT DETECTED NOT DETECTED Final  Metapneumovirus NOT DETECTED NOT DETECTED Final   Rhinovirus / Enterovirus NOT DETECTED NOT DETECTED Final   Influenza A NOT DETECTED NOT DETECTED Final   Influenza B NOT DETECTED NOT DETECTED Final   Parainfluenza Virus 1 NOT DETECTED NOT DETECTED Final   Parainfluenza Virus 2 NOT DETECTED NOT DETECTED Final   Parainfluenza Virus 3 NOT DETECTED NOT DETECTED Final   Parainfluenza Virus 4 NOT DETECTED NOT DETECTED Final   Respiratory Syncytial Virus NOT DETECTED NOT DETECTED Final   Bordetella pertussis NOT DETECTED NOT DETECTED Final    Bordetella Parapertussis NOT DETECTED NOT DETECTED Final   Chlamydophila pneumoniae NOT DETECTED NOT DETECTED Final   Mycoplasma pneumoniae NOT DETECTED NOT DETECTED Final    Comment: Performed at [facility], [address], [place], [place] [address]  MRSA Next Gen by PCR, Nasal     Status: None   Collection Time: 12/23/23  9:05 AM   Specimen: Nasal Mucosa; Nasal Swab  Result Value Ref Range Status   MRSA by PCR Next Gen NOT DETECTED NOT DETECTED Final    Comment: (NOTE) The GeneXpert MRSA Assay (FDA approved for NASAL specimens only), is one component of a comprehensive MRSA colonization surveillance program. It is not intended to diagnose MRSA infection nor to guide or monitor treatment for MRSA infections. Test performance is not FDA approved in patients less than 39 years old. Performed at [facility], [address] [place], [place] [address]      Total time spend on discharging this patient, including the last patient exam, discussing the hospital stay, instructions for ongoing care as it relates to all pertinent caregivers, as well as preparing the medical discharge records, prescriptions, and/or referrals as applicable, is 60 minutes.    Darlin Priestly, MD  [facility] 01/01/2024, 10:49 AM

## 2024-01-04 ENCOUNTER — Ambulatory Visit: Payer: Medicare Other | Admitting: Cardiology

## 2024-01-07 ENCOUNTER — Ambulatory Visit: Payer: Medicare Other | Admitting: Physician Assistant

## 2024-01-13 NOTE — Progress Notes (Unsigned)
 Cardiology Clinic Note   Date: 01/17/2024 ID: [name], DOB April 06, 1941, MRN [id number]  Primary Cardiologist:  [name] Kirks, MD  Chief Complaint   [name] is a 83 y.o. female who presents to the clinic today for hospital follow up.   Patient Profile   [name] is followed by Dr. Alvenia [name] for the history outlined below.      Past medical history significant for: Nonobstructive CAD. LHC 08/13/2014 (angina): Ostial D2 20%.  Ostial LCx 20%. Chronic HFpEF. Echo 04/19/2023: EF 60 to 65%.  No RWMA.  Mild LVH.  Indeterminate diastolic parameters.  Normal RV function.  Mild RVH.  Mildly elevated PA pressure, RVSP 36.7 mmHg.  Severe LAE.  Mild RAE.  Mild MR. PAF. Onset July 2024. 14-day ZIO 06/25/2023: HR 45 to 154 bpm, average 56 bpm.  2 runs of NSVT fastest lasting 4 beats max rate 154 bpm, longest lasting 8 beats average rate 129 bpm.  43 runs of SVT fastest lasting 6 beats max rate 150 bpm, longest lasting 12.7 seconds average rate 103 bpm.  Idioventricular rhythm was present.  Rare ectopy.  Patient triggered events associated with NSR. Hypertension. Hyperlipidemia. OSA. COPD. GERD. T2DM. Hypothyroidism. ESRD on HD. MWF.  In summary, patient was first evaluated by Dr. Alvenia [name] on 07/24/2014 for chest pain and shortness of breath at the request of Dr. Aram [name].  She underwent nuclear stress testing which is a moderate risk study demonstrating mid apical, inferior lateral perfusion defect with mild reversibility suggestive of mild to moderate infarct with mild peri-infarct ischemia.  She underwent LHC which showed mild nonobstructive CAD as detailed above.  Patient underwent hospital admission 03/23/2023 to 03/31/2023 due to hypocalcemia and hypomagnesia.  She was rehospitalized 04/16/2023 to 04/22/2023 with generalized weakness and shortness of breath.  She was found to have A-fib on telemetry.  Echo demonstrated normal LV function as detailed above.  She presented back to [facility]  emergency department on 05/22/2023 with complaints of shortness of breath.  She was found to be in A-fib with a rate of 106 bpm.  She spontaneously converted.  She was placed on Eliquis 2.5 mg twice daily for stroke prophylaxis.  She was placed on a 14-day ZIO which demonstrated runs of NSVT and SVT as detailed above.  Patient was last seen in the office by [name] Cockayne, NP on 10/05/2023 for routine follow-up.  She was doing well at that time.  She complained of dyspnea coming into the clinic that she related to the cold air.  She had been dealing with a cough for several weeks and was treated with Zithromax for acute bronchitis in December 2024.  Patient underwent hospital admission from 12/21/2023 to 01/01/2024 for pneumonia, COPD exacerbation, CHF exacerbation.  Patient presented to the ED on 12/21/2023 with complaints of shortness of breath, cough, fever.  Upon EMS arrival SpO2 was 90% on room air improving to 93% on 4 L.  Plan to start patient on dialysis to help manage volume.  Her hospital course was complicated by AV fistula malfunction and she underwent angioplasty and stent placement with vascular surgery.  She was discharged on 01/01/2024 with plan to begin outpatient dialysis.     History of Present Illness    Today, patient is accompanied by her daughter. She reports doing well since hospital discharge. She is undergoing hemodialysis MWF with good tolerance. She was instructed to hold hydralazine until after dialysis and has not had issues with BP dropping since. She denies shortness of  breath or lower extremity edema since starting dialysis. She does get somewhat winded with heavier exertion but takes a few deep breaths and it resolves. She stays active at home walking and doing laundry which requires her to navigate a steep flight of stairs. She is scheduled to start home PT today. She is not having any chest pain, pressure or tightness. She reports occasional palpitations described as heart racing  with exertion that resolves quickly with rest.     ROS: All other systems reviewed and are otherwise negative except as noted in History of Present Illness.  EKGs/Labs Reviewed    EKG Interpretation Date/Time:  Thursday January 17 2024 09:17:55 EDT Ventricular Rate:  59 PR Interval:  172 QRS Duration:  88 QT Interval:  474 QTC Calculation: 469 R Axis:   -7  Text Interpretation: Sinus bradycardia Minimal voltage criteria for LVH, may be normal variant ( R in aVL ) Nonspecific ST abnormality When compared with ECG of 21-Dec-2023 14:37, PREVIOUS ECG IS PRESENT Confirmed by Morey Ar [phone]) on 01/17/2024 9:23:48 AM   08/05/2023: ALT 25; AST 28 12/31/2023: BUN 81; Creatinine, Ser 3.02; Potassium 4.4; Sodium 135   12/31/2023: Hemoglobin 8.4; WBC 7.8   04/18/2023: TSH 1.325   12/22/2023: B Natriuretic Peptide 1,766.6   Risk Assessment/Calculations     CHA2DS2-VASc Score = 6   This indicates a 9.7% annual risk of stroke. The patient's score is based upon: CHF History: 1 HTN History: 1 Diabetes History: 1 Stroke History: 0 Vascular Disease History: 0 Age Score: 2 Gender Score: 1         Physical Exam    VS:  BP (!) 140/58 (BP Location: Right Arm)   Pulse (!) 59   Ht 5\' 2"  (1.575 m)   Wt 160 lb (72.6 kg)   SpO2 96%   BMI 29.26 kg/m  , BMI Body mass index is 29.26 kg/m.  GEN: Well nourished, well developed, in no acute distress. Neck: No JVD or carotid bruits. Cardiac:  RRR. 2/6 systolic murmur. No rubs or gallops.   Respiratory:  Respirations regular and unlabored. Clear to auscultation without rales, wheezing or rhonchi. GI: Soft, nontender, nondistended. Extremities: Radials/DP/PT 2+ and equal bilaterally. No clubbing or cyanosis. No edema.  Skin: Warm and dry, no rash. Neuro: Strength intact.  Assessment & Plan   Nonobstructive CAD LHC November 2015 showed ostial D2 20%, ostial LCx 20%.  Patient denies chest pain, pressure or tightness. She is active at  home walking and doing laundry which requires her to navigate a steep flight of steps.   -Continue amlodipine, metoprolol, rosuvastatin.  Patient not on aspirin secondary to Eliquis.  Chronic HFpEF Echo July 2024 showed EF 60 to 65%, mild LVH/RVH, normal RV function, mildly elevated PA pressure, severe LAE, mild RAE, mild MR.  Patient denies shortness of breath or lower extremity edema since starting dialysis. She reports dyspnea with heavier exertion that resolves with deep breathing.  Euvolemic and well compensated on exam. -Continue metoprolol, hydralazine, Lasix.  Patient not a candidate for SGLT2i or MRA secondary to kidney function.  Palpitations/PAF Onset July 2024.  14-day ZIO September 2024 showed HR 45 to 154 bpm, average 56 bpm, 2 runs of NSVT, 43 runs of SVT, rare ectopy.  Denies spontaneous bleeding concerns.  Patient reports occasional palpitations described as heart racing with exertion that resolves quickly with rest.  -Continue metoprolol, Eliquis. Appropriate Eliquis dose.  Hypertension BP today 140/58. No report of headaches or dizziness.  -Continue amlodipine, hydralazine,  metoprolol.  ESRD Undergoing hemodialysis MWF. She continues to make urine.  She is holding hydralazine until after dialysis secondary to hypotension during treatment.  -Continue to follow with nephrology.  Disposition: Return in 3 months or sooner as needed.          Signed, [name]. Arshan Jabs, DNP, NP-C

## 2024-01-17 ENCOUNTER — Encounter: Payer: Self-pay | Admitting: Student

## 2024-01-17 ENCOUNTER — Ambulatory Visit: Attending: Student | Admitting: Student

## 2024-01-17 VITALS — BP 140/58 | HR 59 | Ht 62.0 in | Wt 160.0 lb

## 2024-01-17 DIAGNOSIS — I2510 Atherosclerotic heart disease of native coronary artery without angina pectoris: Secondary | ICD-10-CM | POA: Diagnosis present

## 2024-01-17 DIAGNOSIS — I10 Essential (primary) hypertension: Secondary | ICD-10-CM

## 2024-01-17 DIAGNOSIS — I480 Paroxysmal atrial fibrillation: Secondary | ICD-10-CM

## 2024-01-17 DIAGNOSIS — Z992 Dependence on renal dialysis: Secondary | ICD-10-CM | POA: Diagnosis present

## 2024-01-17 DIAGNOSIS — N186 End stage renal disease: Secondary | ICD-10-CM

## 2024-01-17 DIAGNOSIS — R002 Palpitations: Secondary | ICD-10-CM

## 2024-01-17 DIAGNOSIS — I5032 Chronic diastolic (congestive) heart failure: Secondary | ICD-10-CM | POA: Diagnosis present

## 2024-01-17 NOTE — Patient Instructions (Signed)
 Medication Instructions:  Your physician recommends that you continue on your current medications as directed. Please refer to the Current Medication list given to you today.  *If you need a refill on your cardiac medications before your next appointment, please call your pharmacy*   Follow-Up: At [facility], you and your health needs are our priority.  As part of our continuing mission to provide you with exceptional heart care, our providers are all part of one team.  This team includes your primary Cardiologist (physician) and Advanced Practice Providers or APPs (Physician Assistants and Nurse Practitioners) who all work together to provide you with the care you need, when you need it.  Your next appointment:   3 month(s)  Provider:   Morey Ar, NP    We recommend signing up for the patient portal called "MyChart".  Sign up information is provided on this After Visit Summary.  MyChart is used to connect with patients for Virtual Visits (Telemedicine).  Patients are able to view lab/test results, encounter notes, upcoming appointments, etc.  Non-urgent messages can be sent to your provider as well.   To learn more about what you can do with MyChart, go to [email or web address].

## 2024-01-21 ENCOUNTER — Inpatient Hospital Stay: Payer: Medicare Other | Attending: Internal Medicine

## 2024-01-21 ENCOUNTER — Inpatient Hospital Stay: Payer: Medicare Other

## 2024-01-24 ENCOUNTER — Telehealth: Payer: Self-pay | Admitting: Cardiovascular Disease

## 2024-01-24 NOTE — Telephone Encounter (Signed)
 STAT if HR is under 50 or over 120  (normal HR is 60-100 beats per minute)  What is your heart rate? 56 HR  Do you have a log of your heart rate readings (document readings)? no  Do you have any other symptoms? No, pt is asymptomatic but she just wanted to call and report it since home visit today

## 2024-01-24 NOTE — Telephone Encounter (Signed)
 Called and spoke with niece about pt report of HR being 58, it seems this is a chronic problem with the patient, being she has a history of bradycardia and is typically running in the 50's. Niece reports no issues at this time and was advised to call back if anything arises.

## 2024-01-28 ENCOUNTER — Other Ambulatory Visit (INDEPENDENT_AMBULATORY_CARE_PROVIDER_SITE_OTHER): Payer: Self-pay | Admitting: Nurse Practitioner

## 2024-01-28 DIAGNOSIS — N186 End stage renal disease: Secondary | ICD-10-CM

## 2024-01-29 ENCOUNTER — Ambulatory Visit (INDEPENDENT_AMBULATORY_CARE_PROVIDER_SITE_OTHER): Admitting: Otolaryngology

## 2024-01-29 ENCOUNTER — Encounter (INDEPENDENT_AMBULATORY_CARE_PROVIDER_SITE_OTHER): Payer: Self-pay

## 2024-01-29 VITALS — BP 152/64 | HR 74 | Ht 62.0 in | Wt 162.0 lb

## 2024-01-29 DIAGNOSIS — H9190 Unspecified hearing loss, unspecified ear: Secondary | ICD-10-CM

## 2024-01-29 DIAGNOSIS — R0981 Nasal congestion: Secondary | ICD-10-CM

## 2024-01-29 DIAGNOSIS — R0982 Postnasal drip: Secondary | ICD-10-CM | POA: Diagnosis not present

## 2024-01-29 DIAGNOSIS — J383 Other diseases of vocal cords: Secondary | ICD-10-CM

## 2024-01-29 DIAGNOSIS — J341 Cyst and mucocele of nose and nasal sinus: Secondary | ICD-10-CM

## 2024-01-29 DIAGNOSIS — R490 Dysphonia: Secondary | ICD-10-CM

## 2024-01-29 NOTE — Patient Instructions (Signed)
 Use flonase  spray two sprays each nostril twice per day; then right after, use astelin  nasal spray two sprays each nostril twice per day

## 2024-01-29 NOTE — Progress Notes (Signed)
 Dear Dr. Alva Jewels, Here is my assessment for our mutual patient, [name]. Thank you for allowing me the opportunity to care for your patient. Please do not hesitate to contact me should you have any other questions. Sincerely, Dr. Milon Aloe  Otolaryngology Clinic Note Referring provider: Dr. Alva Jewels HPI:  [name] is a 83 y.o. female kindly referred by Dr. Alva Jewels for evaluation of cough, dysphonia and congestion.  Initial visit (09/2023): Patient reports: she has had some dysphonia after thyroidectomy in Nov 2020 (Dr. Leslye Rast). Woke up with a hoarse voice, and it has improved but reports "comes and goes". She reports it is not getting worse, but stable with intermittent worsening. She reports her main problem is with voice projection. Never complete loss but some worsening with use. Coughing or throat clearing is not chronic and not a significant issue except for after her URI a few weeks ago. Patient otherwise denies: - dysphagia, odynophagia, aspiration episodes, need for Heimlich, unintentional weight loss - hemoptysis - ear pain, neck masses - Shortness of breath (new)  No significant voice use requirements, and overall, voice is not significantly bothersome - it is ok for her use currently. She has had to be intubated for a RT Leg injury in Oct 2024.   She reports that more recently, she has had cough and nasal congestion ongoing for about 3-4 weeks. Mucoid anterior rhinorrhea. She is having productive green mucus. Some decline in sense of smell. Some max soreness but no pressure. No frequent sinus infections requiring abx/steroids. Has had an ED visit for it, got azithromycin  and was diagnosed with bronchitis. Of note, she also was prescribed z-pack and pred burst in Nov 2024 for COPD exacerbation (presumed). No fevers. She does feel like things are slowly getting better but not all the way there. She is currently using astelin . No frequent sinus infections generally; no ocular  sx including double vision or decline.  She follows with pulm for OSA eval and bronchitis/COPD and takes advair daily. Dr. Auston Left Referred her for evaluation for this.  She is currently on Vantin  for UTI  --------------------------------------------------------- 01/29/2024 Returns for follow up. She reports that she is using the flonase  and astelin . Intermittent rinse use. These have helped her with her dysphonia and post nasal drip. No ocular symptoms, vision change, or CRS symptoms including facial pressure/pain, discolored drainage.  Of note, her dysphonia got temporarily worse after she was admitted for COPD exacerbation and now is on dialysis. Minimal cough, no significant throat clearing. Her voice continues to be adequate for her use she reports, and she again reported that she would not like to pursue any intervention for it as her husband has had had health issues and she has a lot going on in her life currently. No issues with swallowing.  Of note, she also reports that she also has long-standing b/l HL (worse after her PNA) with some bilateral ear fullness (resolved). Gradual decline. Denies ear pain, drainage, vertigo, tinnitus or frequent ear infections or issues. She would like to have her hearing checked  H&N Surgery: Total thyroidectomy (2020) Personal or FHx of bleeding dz or anesthesia difficulty: no  GLP-1: no AP/AC: Eliquis   Tobacco: never.  PMHx: T2DM, CKD, HFpEF, Post-op hypothyroidism, HTN, Aenima, Secondary hyperPTH, COPD/Bronchitis, OSA  Independent Review of Additional Tests or Records:  Dr. Auston Left (08/2023) Pulm - noted excessive sleepiness, morning headaches, h/o COPD on advair; cough for several weeks, recent intubation; hoarseness and cough with productive sputum; Dx: OSA, COPD, question VF dysfunction;  Rx: advair, Z-pack, Pred burst; ref to ENT ED Notes (09/09/2023): for cough/congestion. 3 weeks, h/o lung infections; no fevers, SOB. CXR b/l interstitial opacities;  c/f COPD exacerbation; Rx: zithromax  Dr. 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  01/01/2024 [facility] Discharge summary (Dr. Gordy Lauber): admitted for productive cough and fevers and chills, dx with CAP with new O2 requirement and COPD exacerbation and HFpEF exacerbatio nand ESRD.  Labs 12/31/2023: CBC and BMP: EBC 7.8, Plt 209, BUN/Cr 81/3.02 PMH/Meds/All/SocHx/FamHx/ROS:   Past Medical History:  Diagnosis Date   Actinic keratosis    Albuminuria    Anemia    Arthritis    Asthma    Basal cell carcinoma 04/12/2017   Above right lateral brow. Nodulocystic type. EDC   Cancer (HCC)    skin   Cataract cortical, senile    CHF (congestive heart failure) (HCC)    Diabetes mellitus without complication (HCC)    GERD (gastroesophageal reflux disease)    Hemorrhoids    History of kidney stones    Hyperlipidemia    Hypertension    Hypothyroidism    Lyme disease    No kidney function    OSA (obstructive sleep apnea)    Osteoporosis    Osteoporosis    Reflux esophagitis    Steatohepatitis    Steatohepatitis      Past Surgical History:  Procedure Laterality Date   A/V FISTULAGRAM N/A 12/31/2023   Procedure: A/V Fistulagram;  Surgeon: Celso College, MD;  Location: [facility] INVASIVE CV LAB;  Service: Cardiovascular;  Laterality: N/A;   ABDOMINAL HYSTERECTOMY     APPENDECTOMY     AV FISTULA PLACEMENT Left 05/19/2022   Procedure: ARTERIOVENOUS (AV) FISTULA CREATION ( BRACHIAL CEPHALIC );  Surgeon: Jackquelyn Mass, MD;  Location: [facility] ORS;  Service: Vascular;  Laterality: Left;   CARDIAC CATHETERIZATION  1980   [facility]   CARDIAC CATHETERIZATION  08/13/2014   [facility]. no significant CAD, normal LVEDP.    CATARACT EXTRACTION     CHOLECYSTECTOMY     COLONOSCOPY      COLONOSCOPY WITH PROPOFOL  N/A 12/07/2016   Procedure: COLONOSCOPY WITH PROPOFOL ;  Surgeon: Deveron Fly, MD;  Location: [facility] ENDOSCOPY;  Service: Endoscopy;  Laterality: N/A;   DIALYSIS/PERMA CATHETER INSERTION N/A 12/31/2023   Procedure: DIALYSIS/PERMA CATHETER INSERTION;  Surgeon: Celso College, MD;  Location: [facility] INVASIVE CV LAB;  Service: Cardiovascular;  Laterality: N/A;   ESOPHAGOGASTRODUODENOSCOPY     ESOPHAGOGASTRODUODENOSCOPY (EGD) WITH PROPOFOL  N/A 12/07/2016   Procedure: ESOPHAGOGASTRODUODENOSCOPY (EGD) WITH PROPOFOL ;  Surgeon: Deveron Fly, MD;  Location: [facility] ENDOSCOPY;  Service: Endoscopy;  Laterality: N/A;   ESOPHAGOGASTRODUODENOSCOPY (EGD) WITH PROPOFOL  N/A 01/07/2018   Procedure: ESOPHAGOGASTRODUODENOSCOPY (EGD) WITH PROPOFOL ;  Surgeon: Deveron Fly, MD;  Location: [facility] ENDOSCOPY;  Service: Endoscopy;  Laterality: N/A;   EYE SURGERY     HEMATOMA EVACUATION Right 08/02/2023   Procedure: EVACUATION HEMATOMA;  Surgeon: Alben Alma, MD;  Location: [facility] ORS;  Service: General;  Laterality: Right;   HEMORRHOID SURGERY     PARTIAL HYSTERECTOMY     THYROIDECTOMY N/A 08/25/2019   Procedure: THYROIDECTOMY EXTRACTION OF SUBTOTAL COMPONENT; PARATHYROID  AUTOTRANSPLANT X1;  Surgeon: Mercy Stall, MD;  Location: [facility] ORS;  Service: General;  Laterality: N/A;  With Nerve Monitoring(RLN)   TONSILLECTOMY  Family History  Problem Relation Age of Onset   Breast cancer Mother    Heart attack Father      Social Connections: Moderately Isolated (12/22/2023)   Social Connection and Isolation Panel [NHANES]    Frequency of Communication with Friends and Family: More than three times a week    Frequency of Social Gatherings with Friends and Family: More than three times a week    Attends Religious Services: Never    Database administrator or Organizations: No    Attends Engineer, structural: Never    Marital Status: Married      Current Outpatient Medications:     acetaminophen  (TYLENOL ) 500 MG tablet, Take 1,000 mg by mouth every 6 (six) hours as needed for mild pain or moderate pain., Disp: , Rfl:    albuterol  (VENTOLIN  HFA) 108 (90 Base) MCG/ACT inhaler, Inhale 2 puffs into the lungs every 6 (six) hours as needed for wheezing or shortness of breath., Disp: 18 g, Rfl: 3   allopurinol  (ZYLOPRIM ) 100 MG tablet, Take 100 mg by mouth daily., Disp: , Rfl:    amLODipine  (NORVASC ) 5 MG tablet, Take 1 tablet (5 mg total) by mouth daily., Disp: , Rfl:    apixaban  (ELIQUIS ) 2.5 MG TABS tablet, Take 1 tablet (2.5 mg total) by mouth 2 (two) times daily., Disp: 60 tablet, Rfl: 11   benzonatate  (TESSALON ) 100 MG capsule, Take 2 capsules (200 mg total) by mouth 3 (three) times daily as needed for cough. Home med., Disp: , Rfl:    epoetin  alfa-epbx (RETACRIT ) 4000 UNIT/ML injection, Inject 1 mL (4,000 Units total) into the vein every Monday, Wednesday, and Friday at 6 PM., Disp: , Rfl:    esomeprazole  (NEXIUM ) 40 MG capsule, Take 1 capsule (40 mg total) by mouth daily before breakfast., Disp: 30 capsule, Rfl: 2   ferrous sulfate 325 (65 FE) MG tablet, Take 325 mg by mouth daily with breakfast., Disp: , Rfl:    fluticasone  furoate-vilanterol (BREO ELLIPTA ) 200-25 MCG/ACT AEPB, Inhale 1 puff into the lungs daily., Disp: 30 each, Rfl: 5   folic acid  (FOLVITE ) 1 MG tablet, Take 1 tablet (1 mg total) by mouth daily., Disp: , Rfl:    hydrALAZINE  (APRESOLINE ) 100 MG tablet, Take 1 tablet (100 mg total) by mouth 3 (three) times daily., Disp: 270 tablet, Rfl: 1   ipratropium (ATROVENT ) 0.06 % nasal spray, Place 2 sprays into both nostrils 4 (four) times daily., Disp: 15 mL, Rfl: 12   ipratropium-albuterol  (DUONEB) 0.5-2.5 (3) MG/3ML SOLN, Take 3 mLs by nebulization every 6 (six) hours as needed (SOB)., Disp: 360 mL, Rfl: 0   levothyroxine  (SYNTHROID ) 75 MCG tablet, Take 75 mcg by mouth daily., Disp: , Rfl:    lidocaine -prilocaine  (EMLA ) cream, as directed. Before dialysis, Disp:  , Rfl:    loperamide  (IMODIUM ) 2 MG capsule, Take 1 capsule (2 mg total) by mouth as needed for diarrhea or loose stools., Disp: 30 capsule, Rfl: 0   metoprolol  tartrate (LOPRESSOR ) 50 MG tablet, Take 25 mg by mouth 2 (two) times daily., Disp: , Rfl:    montelukast  (SINGULAIR ) 10 MG tablet, Take 1 tablet (10 mg total) by mouth at bedtime., Disp: 30 tablet, Rfl: 2   Multiple Minerals-Vitamins (CALCIUM  & VIT D3 BONE HEALTH PO), Take 1 tablet by mouth daily. 600 mg/ 25 mg, Disp: , Rfl:    promethazine -dextromethorphan  (PROMETHAZINE -DM) 6.25-15 MG/5ML syrup, Take 5 mLs by mouth 4 (four) times daily as needed., Disp: 118 mL, Rfl: 0  rosuvastatin  (CRESTOR ) 40 MG tablet, Take 40 mg by mouth daily., Disp: , Rfl:    senna-docusate (SENOKOT-S) 8.6-50 MG tablet, Take 2 tablets by mouth at bedtime as needed for mild constipation., Disp: , Rfl:    sertraline  (ZOLOFT ) 100 MG tablet, Take 100 mg by mouth daily., Disp: , Rfl:    vitamin B-12 (CYANOCOBALAMIN ) 500 MCG tablet, Take 500 mcg by mouth daily., Disp: , Rfl:    azelastine  (ASTELIN ) 0.1 % nasal spray, Place 2 sprays into both nostrils 2 (two) times daily. Use in each nostril as directed, Disp: 30 mL, Rfl: 12   fluticasone  (FLONASE ) 50 MCG/ACT nasal spray, Place 2 sprays into both nostrils daily., Disp: 11 mL, Rfl: 5   furosemide  (LASIX ) 40 MG tablet, Take 1 tablet (40 mg total) by mouth 2 (two) times daily., Disp: 60 tablet, Rfl: 4   Physical Exam:   BP (!) 152/64 (BP Location: Right Arm, Patient Position: Sitting, Cuff Size: Large)   Pulse 74   Ht 5\' 2"  (1.575 m)   Wt 162 lb (73.5 kg)   SpO2 90%   BMI 29.63 kg/m   Salient findings:  CN II-XII intact  Bilateral EAC clear and TM intact with well pneumatized middle ear spaces Anterior rhinoscopy: Septum relatively midline; bilateral inferior turbinates with modest hypertrophy.  Weber mid [employer] 512 AC>BC b/l No lesions of oral cavity/oropharynx No obviously palpable neck  masses/lymphadenopathy/thyromegaly No respiratory distress or stridor; Voice quality class 2.5, mild cough today; TFL was indicated to better evaluate the proximal airway, given the patient's history and exam findings, and is detailed below. Neck incision well healed  Seprately Identifiable Procedures:   Procedure Note (Today) Pre-procedure diagnosis:  Dysphonia, cough, vocal fold lesion Post-procedure diagnosis: Same Procedure: Transnasal Fiberoptic Laryngoscopy, CPT 31575 - Mod 25 Indication: dysphonia, cough, vocal fold lesion Complications: None apparent EBL: 0 mL  The procedure was undertaken to further evaluate the patient's complaint of dysphonia and cough and vocal fold lesion. with mirror exam inadequate for appropriate examination due to gag reflex and poor patient tolerance  Procedure:  Patient was identified as correct patient. Verbal consent was obtained. The nose was sprayed with oxymetazoline and 4% lidocaine . The The flexible laryngoscope was passed through the nose to view the nasal cavity, pharynx (oropharynx, hypopharynx) and larynx.  The larynx was examined at rest and during multiple phonatory tasks. Documentation was obtained and reviewed with patient. The scope was removed. The patient tolerated the procedure well.  Findings: The nasal cavity and nasopharynx did not reveal any masses or lesions, mucosa appeared to be without obvious lesions. The tongue base, pharyngeal walls, piriform sinuses, vallecula, epiglottis and postcricoid region are normal in appearance. The visualized portion of the subglottis and proximal trachea is widely patent. The vocal folds are mobile bilaterally. There are no lesions on the free edge of the vocal folds nor elsewhere in the larynx worrisome for malignancy but she does have a small cystic or polypoid lesion on right - this is stable. No obvious reactive lesion left today but unable to again do a strobe due to clinic constraints. Bilateral  vocal fold atrophy.    Electronically signed by: Evelina Hippo, MD 01/29/2024 7:32 PM   Prior procedures: PROCEDURE: Bilateral Diagnostic Rigid Nasal Endoscopy Prior, not today Description of Procedure:  Patient was identified. A rigid 30 degree endoscope was utilized to evaluate the sinonasal cavities, mucosa, sinus ostia and turbinates and septum.  Overall, signs of minimal mucosal inflammation are noted with modest amount  of mucoid secretions along nasal floor and some minimal mucosal edema b/l.  No mucopurulence, polyps, or masses noted.   Right Middle meatus: clear Right SE Recess: clear Left MM: clear Left SE Recess: clear    Impression & Plans:  [name] is a 83 y.o. female with multiple cormorbidities including COPD now with:  1. Subjective hearing loss   2. Dysphonia   3. Lesion of vocal fold   4. Vocal fold atrophy   5. Mucocele of ethmoid sinus   6. Post-nasal drip   7. Nasal congestion    Does have some dysphonia since thyroidectomy; stable, TFL shows stable right polypoid VF lesion. Does not appear malignant, query if related to her coughing and exacerbations. We discussed management for this, but given her age and cormorbidities, she would like to observe which is reasonable since her voice is adequate for her use. - Options include Speech Rx (may help some) and bx and excision. She would like to forego these and observe  For her ethmoid mucocele: she has not had any change in her mucocele in 7 years, so reasonable to observe given she does not want to do anything; no ocular sx. No CRS sx currently - Continue flonase  BID; continue astelin  to BID - Daily nasal rinses  HL: likley presbycusis and prior fullness likely related to ETD; will get HT  F/u in 6 months, first available HT; sooner if any concerns  See below regarding exact medications prescribed this encounter including dosages and route: No orders of the defined types were placed in this  encounter.   Thank you for allowing me the opportunity to care for your patient. Please do not hesitate to contact me should you have any other questions.  Sincerely, Milon Aloe, MD Otolaryngologist (ENT), [facility] Phone: [phone] Fax: [phone]  01/29/2024, 7:32 PM   I have personally spent 42 minutes involved in face-to-face and non-face-to-face activities for this patient on the day of the visit.  Professional time spent excludes any procedures performed but includes the following activities, in addition to those noted in the documentation: preparing to see the patient (review of extensive outside documentation and results), performing a medically appropriate examination, counseling, addressing multiple medical problems, documenting in the electronic health record

## 2024-01-31 ENCOUNTER — Encounter (INDEPENDENT_AMBULATORY_CARE_PROVIDER_SITE_OTHER): Payer: Self-pay | Admitting: Nurse Practitioner

## 2024-01-31 ENCOUNTER — Ambulatory Visit (INDEPENDENT_AMBULATORY_CARE_PROVIDER_SITE_OTHER): Admitting: Nurse Practitioner

## 2024-01-31 ENCOUNTER — Ambulatory Visit (INDEPENDENT_AMBULATORY_CARE_PROVIDER_SITE_OTHER)

## 2024-01-31 VITALS — BP 163/63 | HR 59 | Resp 17 | Ht 62.0 in | Wt 159.0 lb

## 2024-01-31 DIAGNOSIS — E669 Obesity, unspecified: Secondary | ICD-10-CM | POA: Diagnosis not present

## 2024-01-31 DIAGNOSIS — N186 End stage renal disease: Secondary | ICD-10-CM | POA: Diagnosis not present

## 2024-01-31 DIAGNOSIS — I10 Essential (primary) hypertension: Secondary | ICD-10-CM

## 2024-01-31 DIAGNOSIS — E1169 Type 2 diabetes mellitus with other specified complication: Secondary | ICD-10-CM | POA: Diagnosis not present

## 2024-02-03 ENCOUNTER — Encounter (INDEPENDENT_AMBULATORY_CARE_PROVIDER_SITE_OTHER): Payer: Self-pay | Admitting: Nurse Practitioner

## 2024-02-03 NOTE — Progress Notes (Signed)
 Subjective:    Patient ID: [name], female    DOB: [birth year], 83 y.o.   MRN: [id number] Chief Complaint  Patient presents with   Venous Insufficiency    The patient returns to the office for followup status post intervention of their dialysis access on 12/13/2023 of his left brachiocephalic AV fistula.   Following the intervention the access function has significantly improved, with better flow rates and improved KT/V. The patient has not been experiencing increased bleeding times following decannulation and the patient denies increased recirculation. The patient denies an increase in arm swelling. At the present time the patient denies hand pain.  No recent shortening of the patient's walking distance or new symptoms consistent with claudication.  No history of rest pain symptoms. No new ulcers or wounds of the lower extremities have occurred.  The patient denies amaurosis fugax or recent TIA symptoms. There are no recent neurological changes noted. There is no history of DVT, PE or superficial thrombophlebitis. No recent episodes of angina or shortness of breath documented.   Duplex ultrasound of the AV access shows a patent access.  The previously noted stenosis is improved compared to last study.  Flow volume today is 1631 cc/min (previous flow volume was 1544 cc/min)       Review of Systems  Neurological:  Positive for weakness.  All other systems reviewed and are negative.      Objective:   Physical Exam Vitals reviewed.  HENT:     Head: Normocephalic.  Cardiovascular:     Rate and Rhythm: Normal rate.     Pulses:          Radial pulses are 2+ on the left side.     Arteriovenous access: Left arteriovenous access is present.    Comments: Good thrill and bruit Pulmonary:     Effort: Pulmonary effort is normal.  Skin:    General: Skin is warm and dry.  Neurological:     Mental Status: She is alert and oriented to person, place, and time.  Psychiatric:         Mood and Affect: Mood normal.        Behavior: Behavior normal.        Thought Content: Thought content normal.        Judgment: Judgment normal.     BP (!) 163/63 (BP Location: Right Arm, Patient Position: Sitting, Cuff Size: Normal)   Pulse (!) 59   Resp 17   Ht 5\' 2"  (1.575 m)   Wt 159 lb (72.1 kg)   BMI 29.08 kg/m   Past Medical History:  Diagnosis Date   Actinic keratosis    Albuminuria    Anemia    Arthritis    Asthma    Basal cell carcinoma 04/12/2017   Above right lateral brow. Nodulocystic type. EDC   Cancer (HCC)    skin   Cataract cortical, senile    CHF (congestive heart failure) (HCC)    Diabetes mellitus without complication (HCC)    GERD (gastroesophageal reflux disease)    Hemorrhoids    History of kidney stones    Hyperlipidemia    Hypertension    Hypothyroidism    Lyme disease    No kidney function    OSA (obstructive sleep apnea)    Osteoporosis    Osteoporosis    Reflux esophagitis    Steatohepatitis    Steatohepatitis     Social History   Socioeconomic History   Marital status: Married  Spouse name: Not on file   Number of children: Not on file   Years of education: Not on file   Highest education level: Associate degree: academic program  Occupational History   Not on file  Tobacco Use   Smoking status: Never   Smokeless tobacco: Never  Vaping Use   Vaping status: Never Used  Substance and Sexual Activity   Alcohol use: No   Drug use: No   Sexual activity: Not on file  Other Topics Concern   Not on file  Social History Narrative   Lives at home with husband .   Social Drivers of Corporate investment banker Strain: Low Risk  (06/12/2023)   Overall Financial Resource Strain (CARDIA)    Difficulty of Paying Living Expenses: Not hard at all  Food Insecurity: No Food Insecurity (12/22/2023)   Hunger Vital Sign    Worried About Running Out of Food in the Last Year: Never true    Ran Out of Food in the Last Year: Never true   Transportation Needs: No Transportation Needs (12/22/2023)   PRAPARE - Administrator, Civil Service (Medical): No    Lack of Transportation (Non-Medical): No  Physical Activity: Insufficiently Active (06/12/2023)   Exercise Vital Sign    Days of Exercise per Week: 7 days    Minutes of Exercise per Session: 20 min  Stress: No Stress Concern Present (06/12/2023)   [employer] of Occupational Health - Occupational Stress Questionnaire    Feeling of Stress : Only a little  Social Connections: Moderately Isolated (12/22/2023)   Social Connection and Isolation Panel [NHANES]    Frequency of Communication with Friends and Family: More than three times a week    Frequency of Social Gatherings with Friends and Family: More than three times a week    Attends Religious Services: Never    Database administrator or Organizations: No    Attends Banker Meetings: Never    Marital Status: Married  Catering manager Violence: Not At Risk (12/22/2023)   Humiliation, Afraid, Rape, and Kick questionnaire    Fear of Current or Ex-Partner: No    Emotionally Abused: No    Physically Abused: No    Sexually Abused: No    Past Surgical History:  Procedure Laterality Date   A/V FISTULAGRAM N/A 12/31/2023   Procedure: A/V Fistulagram;  Surgeon: Celso College, MD;  Location: [facility];  Service: Cardiovascular;  Laterality: N/A;   ABDOMINAL HYSTERECTOMY     APPENDECTOMY     AV FISTULA PLACEMENT Left 05/19/2022   Procedure: ARTERIOVENOUS (AV) FISTULA CREATION ( BRACHIAL CEPHALIC );  Surgeon: Jackquelyn Mass, MD;  Location: [facility] ORS;  Service: Vascular;  Laterality: Left;   CARDIAC CATHETERIZATION  1980   [facility]   CARDIAC CATHETERIZATION  08/13/2014   [facility]. no significant CAD, normal LVEDP.    CATARACT EXTRACTION     CHOLECYSTECTOMY     COLONOSCOPY     COLONOSCOPY WITH PROPOFOL  N/A 12/07/2016   Procedure: COLONOSCOPY WITH PROPOFOL ;  Surgeon: Deveron Fly, MD;   Location: [facility] ENDOSCOPY;  Service: Endoscopy;  Laterality: N/A;   DIALYSIS/PERMA CATHETER INSERTION N/A 12/31/2023   Procedure: DIALYSIS/PERMA CATHETER INSERTION;  Surgeon: Celso College, MD;  Location: [facility];  Service: Cardiovascular;  Laterality: N/A;   ESOPHAGOGASTRODUODENOSCOPY     ESOPHAGOGASTRODUODENOSCOPY (EGD) WITH PROPOFOL  N/A 12/07/2016   Procedure: ESOPHAGOGASTRODUODENOSCOPY (EGD) WITH PROPOFOL ;  Surgeon: Deveron Fly, MD;  Location:  [facility] ENDOSCOPY;  Service: Endoscopy;  Laterality: N/A;   ESOPHAGOGASTRODUODENOSCOPY (EGD) WITH PROPOFOL  N/A 01/07/2018   Procedure: ESOPHAGOGASTRODUODENOSCOPY (EGD) WITH PROPOFOL ;  Surgeon: Deveron Fly, MD;  Location: [facility] ENDOSCOPY;  Service: Endoscopy;  Laterality: N/A;   EYE SURGERY     HEMATOMA EVACUATION Right 08/02/2023   Procedure: EVACUATION HEMATOMA;  Surgeon: Alben Alma, MD;  Location: [facility] ORS;  Service: General;  Laterality: Right;   HEMORRHOID SURGERY     PARTIAL HYSTERECTOMY     THYROIDECTOMY N/A 08/25/2019   Procedure: THYROIDECTOMY EXTRACTION OF SUBTOTAL COMPONENT; PARATHYROID  AUTOTRANSPLANT X1;  Surgeon: Mercy Stall, MD;  Location: [facility] ORS;  Service: General;  Laterality: N/A;  With Nerve Monitoring(RLN)   TONSILLECTOMY      Family History  Problem Relation Age of Onset   Cancer Mother        breast   Kidney disease Mother    Breast cancer Mother    Hypertension Father    Heart attack Father     Allergies  Allergen Reactions   Ace Inhibitors     Other reaction(s): Unknown   Egg-Derived Products Diarrhea   Other     Other reaction(s): Other (See Comments) Eggs   Prednisone      Other reaction(s): Other (See Comments) joint pain   Risedronate     Other reaction(s): Other (See Comments)   Sulfa Antibiotics Itching and Swelling    Other reaction(s): Other (See Comments)   Sulfasalazine Other (See Comments)       Latest Ref Rng & Units 12/31/2023    4:54 AM 12/29/2023    8:52 AM 12/28/2023     8:46 AM  CBC  WBC 4.0 - 10.5 K/uL 7.8  9.0  9.7   Hemoglobin 12.0 - 15.0 g/dL 8.4  8.6  8.4   Hematocrit 36.0 - 46.0 % 25.5  27.5  26.0   Platelets 150 - 400 K/uL 209  231  212       CMP     Component Value Date/Time   NA 135 12/31/2023 0454   NA 141 08/11/2014 0850   NA 142 06/20/2012 1353   K 4.4 12/31/2023 0454   K 3.2 (L) 06/20/2012 1353   CL 101 12/31/2023 0454   CL 103 06/20/2012 1353   CO2 22 12/31/2023 0454   CO2 29 06/20/2012 1353   GLUCOSE 85 12/31/2023 0454   GLUCOSE 84 06/20/2012 1353   BUN 81 (H) 12/31/2023 0454   BUN 16 08/11/2014 0850   BUN 13 06/20/2012 1353   CREATININE 3.02 (H) 12/31/2023 0454   CREATININE 0.65 06/20/2012 1353   CALCIUM  9.1 12/31/2023 0454   CALCIUM  9.4 06/20/2012 1353   PROT 6.3 (L) 08/05/2023 0438   PROT 7.4 06/20/2012 1353   ALBUMIN 3.1 (L) 12/29/2023 0852   ALBUMIN 3.8 06/20/2012 1353   AST 28 08/05/2023 0438   AST 32 06/20/2012 1353   ALT 25 08/05/2023 0438   ALT 44 06/20/2012 1353   ALKPHOS 75 08/05/2023 0438   ALKPHOS 106 06/20/2012 1353   BILITOT 0.4 08/05/2023 0438   BILITOT 0.6 06/20/2012 1353   GFRNONAA 15 (L) 12/31/2023 0454   GFRNONAA >60 06/20/2012 1353     VAS US  ABI WITH/WO TBI Result Date: 12/07/2023  LOWER EXTREMITY DOPPLER STUDY Patient Name:  [name]  Date of Exam:   12/06/2023 Medical Rec #: [id number]      Accession #:    [id number] Date of Birth: June 10, 1941      Patient Gender:  F Patient Age:   80 years Exam Location:  [facility] Procedure:      VAS US  ABI WITH/WO TBI Referring Phys: Devon Fogo --------------------------------------------------------------------------------  Indications: Ulceration.  Performing Technologist: Tonie Franks RVS  Examination Guidelines: A complete evaluation includes at minimum, Doppler waveform signals and systolic blood pressure reading at the level of bilateral brachial, anterior tibial, and posterior tibial arteries, when vessel segments are  accessible. Bilateral testing is considered an integral part of a complete examination. Photoelectric Plethysmograph (PPG) waveforms and toe systolic pressure readings are included as required and additional duplex testing as needed. Limited examinations for reoccurring indications may be performed as noted.  ABI Findings: +---------+------------------+-----+---------+--------+ Right    Rt Pressure (mmHg)IndexWaveform Comment  +---------+------------------+-----+---------+--------+ Brachial 174                                      +---------+------------------+-----+---------+--------+ ATA      210               1.21 triphasic         +---------+------------------+-----+---------+--------+ PTA      211               1.21 triphasic         +---------+------------------+-----+---------+--------+ Great Toe153               0.88 Normal            +---------+------------------+-----+---------+--------+ +---------+------------------+-----+---------+-------+ Left     Lt Pressure (mmHg)IndexWaveform Comment +---------+------------------+-----+---------+-------+ ATA      238               1.37 triphasic        +---------+------------------+-----+---------+-------+ PTA      231               1.33 triphasic        +---------+------------------+-----+---------+-------+ Great Toe210               1.21 Normal           +---------+------------------+-----+---------+-------+ +-------+-----------+-----------+------------+------------+ ABI/TBIToday's ABIToday's TBIPrevious ABIPrevious TBI +-------+-----------+-----------+------------+------------+ Right  1.21       .88                                 +-------+-----------+-----------+------------+------------+ Left   1.37       1.21                                +-------+-----------+-----------+------------+------------+  Summary: Right: Resting right ankle-brachial index is within normal range. The right  toe-brachial index is normal. Left: Resting left ankle-brachial index is within normal range. The left toe-brachial index is normal. *See table(s) above for measurements and observations.  Electronically signed by Devon Fogo MD on 12/07/2023 at 8:32:35 AM.    Final        Assessment & Plan:   1. ESRD (end stage renal disease) (HCC) (Primary) Recommend:  The patient is doing well and currently has adequate dialysis access. The patient's dialysis center is not reporting any access issues. Flow pattern is stable when compared to the prior ultrasound.  The patient should have a duplex ultrasound of the dialysis access in 6 months. The patient will follow-up with me in the office after each ultrasound    2. Essential hypertension Continue  antihypertensive medications as already ordered, these medications have been reviewed and there are no changes at this time.  3. Type 2 diabetes mellitus with obesity (HCC) Continue hypoglycemic medications as already ordered, these medications have been reviewed and there are no changes at this time.  Hgb A1C to be monitored as already arranged by primary service   Current Outpatient Medications on File Prior to Visit  Medication Sig Dispense Refill   acetaminophen  (TYLENOL ) 500 MG tablet Take 1,000 mg by mouth every 6 (six) hours as needed for mild pain or moderate pain.     albuterol  (VENTOLIN  HFA) 108 (90 Base) MCG/ACT inhaler Inhale 2 puffs into the lungs every 6 (six) hours as needed for wheezing or shortness of breath. 18 g 3   allopurinol  (ZYLOPRIM ) 100 MG tablet Take 100 mg by mouth daily.     amLODipine  (NORVASC ) 5 MG tablet Take 1 tablet (5 mg total) by mouth daily.     apixaban  (ELIQUIS ) 2.5 MG TABS tablet Take 1 tablet (2.5 mg total) by mouth 2 (two) times daily. 60 tablet 11   epoetin  alfa-epbx (RETACRIT ) 4000 UNIT/ML injection Inject 1 mL (4,000 Units total) into the vein every Monday, Wednesday, and Friday at 6 PM.     esomeprazole   (NEXIUM ) 40 MG capsule Take 1 capsule (40 mg total) by mouth daily before breakfast. 30 capsule 2   ferrous sulfate 325 (65 FE) MG tablet Take 325 mg by mouth daily with breakfast.     fluticasone  furoate-vilanterol (BREO ELLIPTA ) 200-25 MCG/ACT AEPB Inhale 1 puff into the lungs daily. 30 each 5   folic acid  (FOLVITE ) 1 MG tablet Take 1 tablet (1 mg total) by mouth daily.     hydrALAZINE  (APRESOLINE ) 100 MG tablet Take 1 tablet (100 mg total) by mouth 3 (three) times daily. 270 tablet 1   ipratropium (ATROVENT ) 0.06 % nasal spray Place 2 sprays into both nostrils 4 (four) times daily. 15 mL 12   ipratropium-albuterol  (DUONEB) 0.5-2.5 (3) MG/3ML SOLN Take 3 mLs by nebulization every 6 (six) hours as needed (SOB). 360 mL 0   levothyroxine  (SYNTHROID ) 75 MCG tablet Take 75 mcg by mouth daily.     lidocaine -prilocaine  (EMLA ) cream as directed. Before dialysis     loperamide  (IMODIUM ) 2 MG capsule Take 1 capsule (2 mg total) by mouth as needed for diarrhea or loose stools. 30 capsule 0   metoprolol  tartrate (LOPRESSOR ) 50 MG tablet Take 25 mg by mouth 2 (two) times daily.     metoprolol  tartrate (LOPRESSOR ) 50 MG tablet 25 tablet 2 TIMES DAILY (route: oral)     montelukast  (SINGULAIR ) 10 MG tablet Take 1 tablet (10 mg total) by mouth at bedtime. 30 tablet 2   Multiple Minerals-Vitamins (CALCIUM  & VIT D3 BONE HEALTH PO) Take 1 tablet by mouth daily. 600 mg/ 25 mg     rosuvastatin  (CRESTOR ) 40 MG tablet Take 40 mg by mouth daily.     senna-docusate (SENOKOT-S) 8.6-50 MG tablet Take 2 tablets by mouth at bedtime as needed for mild constipation.     sertraline  (ZOLOFT ) 100 MG tablet Take 100 mg by mouth daily.     vitamin B-12 (CYANOCOBALAMIN ) 500 MCG tablet Take 500 mcg by mouth daily.     azelastine  (ASTELIN ) 0.1 % nasal spray Place 2 sprays into both nostrils 2 (two) times daily. Use in each nostril as directed 30 mL 12   fluticasone  (FLONASE ) 50 MCG/ACT nasal spray Place 2 sprays into both nostrils  daily. 11 mL  5   furosemide  (LASIX ) 40 MG tablet Take 1 tablet (40 mg total) by mouth 2 (two) times daily. 60 tablet 4   No current facility-administered medications on file prior to visit.    There are no Patient Instructions on file for this visit. No follow-ups on file.   Montanna Mcbain E Aerica Rincon, NP

## 2024-02-03 NOTE — Progress Notes (Incomplete)
 Subjective:    Patient ID: [name], female    DOB: 08/13/1941, 83 y.o.   MRN: [id number] Chief Complaint  Patient presents with  . Venous Insufficiency    HPI  Review of Systems     Objective:   Physical Exam  BP (!) 163/63 (BP Location: Right Arm, Patient Position: Sitting, Cuff Size: Normal)   Pulse (!) 59   Resp 17   Ht 5\' 2"  (1.575 m)   Wt 159 lb (72.1 kg)   BMI 29.08 kg/m   Past Medical History:  Diagnosis Date  . Actinic keratosis   . Albuminuria   . Anemia   . Arthritis   . Asthma   . Basal cell carcinoma 04/12/2017   Above right lateral brow. Nodulocystic type. EDC  . Cancer (HCC)    skin  . Cataract cortical, senile   . CHF (congestive heart failure) (HCC)   . Diabetes mellitus without complication (HCC)   . GERD (gastroesophageal reflux disease)   . Hemorrhoids   . History of kidney stones   . Hyperlipidemia   . Hypertension   . Hypothyroidism   . Lyme disease   . 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 OSA (obstructive sleep apnea)   . Osteoporosis   . Osteoporosis   . Reflux esophagitis   . Steatohepatitis   . Steatohepatitis     Social History   Socioeconomic History  . Marital status: Married    Spouse name: Not on file  . Number of children: Not on file  . Years of education: Not on file  . Highest education level: Associate degree: academic program  Occupational History  . Not on file  Tobacco Use  . Smoking status: Never  . Smokeless tobacco: Never  Vaping Use  . Vaping status: Never Used  Substance and Sexual Activity  . Alcohol use: No  . Drug use: No  . Sexual activity: Not on file  Other Topics Concern  . Not on file  Social History Narrative   Lives at home with husband .   Social Drivers of Corporate investment banker Strain: Low Risk  (06/12/2023)   Overall Financial Resource Strain (CARDIA)   . Difficulty of Paying Living Expenses: Not hard at all  Food Insecurity: No Food Insecurity (12/22/2023)   Hunger Vital Sign    . Worried About Programme researcher, broadcasting/film/video in the Last Year: Never true   . Ran Out of Food in the Last Year: Never true  Transportation Needs: No Transportation Needs (12/22/2023)   PRAPARE - Transportation   . Lack of Transportation (Medical): No   . Lack of Transportation (Non-Medical): No  Physical Activity: Insufficiently Active (06/12/2023)   Exercise Vital Sign   . Days of Exercise per Week: 7 days   . Minutes of Exercise per Session: 20 min  Stress: No Stress Concern Present (06/12/2023)   [employer] of Occupational Health - Occupational Stress Questionnaire   . Feeling of Stress : Only a little  Social Connections: Moderately Isolated (12/22/2023)   Social Connection and Isolation Panel [NHANES]   . Frequency of Communication with Friends and Family: More than three times a week   . Frequency of Social Gatherings with Friends and Family: More than three times a week   . Attends Religious Services: Never   . Active Member of Clubs or Organizations: No   . Attends Banker Meetings: Never   . Marital Status: Married  Catering manager Violence: Not  At Risk (12/22/2023)   Humiliation, Afraid, Rape, and Kick questionnaire   . Fear of Current or Ex-Partner: No   . Emotionally Abused: No   . Physically Abused: No   . Sexually Abused: No    Past Surgical History:  Procedure Laterality Date  . A/V FISTULAGRAM N/A 12/31/2023   Procedure: A/V Fistulagram;  Surgeon: Celso College, MD;  Location: [facility] INVASIVE [facility];  Service: Cardiovascular;  Laterality: N/A;  . ABDOMINAL HYSTERECTOMY    . APPENDECTOMY    . AV FISTULA PLACEMENT Left 05/19/2022   Procedure: ARTERIOVENOUS (AV) FISTULA CREATION ( BRACHIAL CEPHALIC );  Surgeon: Jackquelyn Mass, MD;  Location: [facility] ORS;  Service: Vascular;  Laterality: Left;  . CARDIAC CATHETERIZATION  1980   [facility]  . CARDIAC CATHETERIZATION  08/13/2014   [facility]. no significant CAD, normal LVEDP.   Aaron Aas CATARACT EXTRACTION    . CHOLECYSTECTOMY     . COLONOSCOPY    . COLONOSCOPY WITH PROPOFOL  N/A 12/07/2016   Procedure: COLONOSCOPY WITH PROPOFOL ;  Surgeon: Deveron Fly, MD;  Location: [facility] ENDOSCOPY;  Service: Endoscopy;  Laterality: N/A;  . DIALYSIS/PERMA CATHETER INSERTION N/A 12/31/2023   Procedure: DIALYSIS/PERMA CATHETER INSERTION;  Surgeon: Celso College, MD;  Location: [facility] INV[facility] CV [facility];  Service: Cardiovascular;  Laterality: N/A;  . ESOPHAGOGASTRODUODENOSCOPY    . ESOPHAGOGASTRODUODENOSCOPY (EGD) WITH PROPOFOL  N/A 12/07/2016   Procedure: ESOPHAGOGASTRODUODENOSCOPY (EGD) WITH PROPOFOL ;  Surgeon: Deveron Fly, MD;  [facility];  Service: Endoscopy;  Laterality: N/A;  . ESOPHAGOGASTRODUODENOSCOPY (EGD) WITH PROPOFOL  N/A 01/07/2018   Procedure: ESOPHAGOGASTRODUODENOSCOPY (EGD) WITH PROPOFOL ;  Surgeon: Deveron Fly, MD;  Location: [facility] ENDOSCOPY;  Service: Endoscopy;  Laterality: N/A;  . EYE SURGERY    . HEMATOMA EVACUATION Right 08/02/2023   Procedure: EVACUATION HEMATOMA;  Surgeon: Alben Alma, MD;  Location: [facility] ORS;  Service: General;  Laterality: Right;  . HEMORRHOID SURGERY    . PARTIAL HYSTERECTOMY    . THYROIDECTOMY N/A 08/25/2019   Procedure: THYROIDECTOMY EXTRACTION OF SUBTOTAL COMPONENT; PARATHYROID  AUTOTRANSPLANT X1;  Surgeon: Mercy Stall, MD;  Location: [facility] ORS;  Service: General;  Laterality: N/A;  With Nerve Monitoring(RLN)  . TONSILLECTOMY      Family History  Problem Relation Age of Onset  . Cancer Mother        breast  . Kidney disease Mother   . Breast cancer Mother   . Hypertension Father   . Heart attack Father     Allergies  Allergen Reactions  . Ace Inhibitors     Other reaction(s): Unknown  . Egg-Derived Products Diarrhea  . Other     Other reaction(s): Other (See Comments) Eggs  . Prednisone      Other reaction(s): Other (See Comments) joint pain  . Risedronate     Other reaction(s): Other (See Comments)  . Sulfa Antibiotics Itching and Swelling    Other  reaction(s): Other (See Comments)  . Sulfasalazine Other (See Comments)       Latest Ref Rng & Units 12/31/2023    4:54 AM 12/29/2023    8:52 AM 12/28/2023    8:46 AM  CBC  WBC 4.0 - 10.5 K/uL 7.8  9.0  9.7   Hemoglobin 12.0 - 15.0 g/dL 8.4  8.6  8.4   Hematocrit 36.0 - 46.0 % 25.5  27.5  26.0   Platelets 150 - 400 K/uL 209  231  212       CMP     Component Value Date/Time   NA  135 12/31/2023 0454   NA 141 08/11/2014 0850   NA 142 06/20/2012 1353   K 4.4 12/31/2023 0454   K 3.2 (L) 06/20/2012 1353   CL 101 12/31/2023 0454   CL 103 06/20/2012 1353   CO2 22 12/31/2023 0454   CO2 29 06/20/2012 1353   GLUCOSE 85 12/31/2023 0454   GLUCOSE 84 06/20/2012 1353   BUN 81 (H) 12/31/2023 0454   BUN 16 08/11/2014 0850   BUN 13 06/20/2012 1353   CREATININE 3.02 (H) 12/31/2023 0454   CREATININE 0.65 06/20/2012 1353   CALCIUM  9.1 12/31/2023 0454   CALCIUM  9.4 06/20/2012 1353   PROT 6.3 (L) 08/05/2023 0438   PROT 7.4 06/20/2012 1353   ALBUMIN 3.1 (L) 12/29/2023 0852   ALBUMIN 3.8 06/20/2012 1353   AST 28 08/05/2023 0438   AST 32 06/20/2012 1353   ALT 25 08/05/2023 0438   ALT 44 06/20/2012 1353   ALKPHOS 75 08/05/2023 0438   ALKPHOS 106 06/20/2012 1353   BILITOT 0.4 08/05/2023 0438   BILITOT 0.6 06/20/2012 1353   GFRNONAA 15 (L) 12/31/2023 0454   GFRNONAA >60 06/20/2012 1353     VAS US  ABI WITH/WO TBI Result Date: 12/07/2023  LOWER EXTREMITY DOPPLER STUDY Patient Name:  KATHLENA DALMAU  Date of Exam:   12/06/2023 Medical Rec #: [id number]      Accession #:    [id number] Date of Birth: 10/05/1940      Patient Gender: F Patient Age:   73 years Exam Location:  [facility] Procedure:      VAS US  ABI WITH/WO TBI Referring Phys: Devon Fogo --------------------------------------------------------------------------------  Indications: Ulceration.  Performing Technologist: Tonie Franks RVS  Examination Guidelines: A complete evaluation includes at minimum, Doppler waveform  signals and systolic blood pressure reading at the level of bilateral brachial, anterior tibial, and posterior tibial arteries, when vessel segments are accessible. Bilateral testing is considered an integral part of a complete examination. Photoelectric Plethysmograph (PPG) waveforms and toe systolic pressure readings are included as required and additional duplex testing as needed. Limited examinations for reoccurring indications may be performed as noted.  ABI Findings: +---------+------------------+-----+---------+--------+ Right    Rt Pressure (mmHg)IndexWaveform Comment  +---------+------------------+-----+---------+--------+ Brachial 174                                      +---------+------------------+-----+---------+--------+ ATA      210               1.21 triphasic         +---------+------------------+-----+---------+--------+ PTA      211               1.21 triphasic         +---------+------------------+-----+---------+--------+ Great Toe153               0.88 Normal            +---------+------------------+-----+---------+--------+ +---------+------------------+-----+---------+-------+ Left     Lt Pressure (mmHg)IndexWaveform Comment +---------+------------------+-----+---------+-------+ ATA      238               1.37 triphasic        +---------+------------------+-----+---------+-------+ PTA      231               1.33 triphasic        +---------+------------------+-----+---------+-------+ Rosaline Coma  1.21 Normal           +---------+------------------+-----+---------+-------+ +-------+-----------+-----------+------------+------------+ ABI/TBIToday's ABIToday's TBIPrevious ABIPrevious TBI +-------+-----------+-----------+------------+------------+ Right  1.21       .88                                 +-------+-----------+-----------+------------+------------+ Left   1.37       1.21                                 +-------+-----------+-----------+------------+------------+  Summary: Right: Resting right ankle-brachial index is within normal range. The right toe-brachial index is normal. Left: Resting left ankle-brachial index is within normal range. The left toe-brachial index is normal. *See table(s) above for measurements and observations.  Electronically signed by Devon Fogo MD on 12/07/2023 at 8:32:35 AM.    Final        Assessment & Plan:   1. ESRD (end stage renal disease) (HCC) (Primary) Recommend:  The patient is doing well and currently has adequate dialysis access. The patient's dialysis center is not reporting any access issues. Flow pattern is stable when compared to the prior ultrasound.  The patient should have a duplex ultrasound of the dialysis access in 6 months. The patient will follow-up with me in the office after each ultrasound    2. Essential hypertension Continue antihypertensive medications as already ordered, these medications have been reviewed and there are no changes at this time.  3. Type 2 diabetes mellitus with obesity (HCC) Continue hypoglycemic medications as already ordered, these medications have been reviewed and there are no changes at this time.  Hgb A1C to be monitored as already arranged by primary service   Current Outpatient Medications on File Prior to Visit  Medication Sig Dispense Refill  . acetaminophen  (TYLENOL ) 500 MG tablet Take 1,000 mg by mouth every 6 (six) hours as needed for mild pain or moderate pain.    . albuterol  (VENTOLIN  HFA) 108 (90 Base) MCG/ACT inhaler Inhale 2 puffs into the lungs every 6 (six) hours as needed for wheezing or shortness of breath. 18 g 3  . allopurinol  (ZYLOPRIM ) 100 MG tablet Take 100 mg by mouth daily.    . amLODipine  (NORVASC ) 5 MG tablet Take 1 tablet (5 mg total) by mouth daily.    . apixaban  (ELIQUIS ) 2.5 MG TABS tablet Take 1 tablet (2.5 mg total) by mouth 2 (two) times daily. 60 tablet 11  . epoetin   alfa-epbx (RETACRIT ) 4000 UNIT/ML injection Inject 1 mL (4,000 Units total) into the vein every Monday, Wednesday, and Friday at 6 PM.    . esomeprazole  (NEXIUM ) 40 MG capsule Take 1 capsule (40 mg total) by mouth daily before breakfast. 30 capsule 2  . ferrous sulfate 325 (65 FE) MG tablet Take 325 mg by mouth daily with breakfast.    . fluticasone  furoate-vilanterol (BREO ELLIPTA ) 200-25 MCG/ACT AEPB Inhale 1 puff into the lungs daily. 30 each 5  . folic acid  (FOLVITE ) 1 MG tablet Take 1 tablet (1 mg total) by mouth daily.    . hydrALAZINE  (APRESOLINE ) 100 MG tablet Take 1 tablet (100 mg total) by mouth 3 (three) times daily. 270 tablet 1  . ipratropium (ATROVENT ) 0.06 % nasal spray Place 2 sprays into both nostrils 4 (four) times daily. 15 mL 12  . ipratropium-albuterol  (DUONEB) 0.5-2.5 (3) MG/3ML SOLN Take 3 mLs by nebulization every 6 (six)  hours as needed (SOB). 360 mL 0  . levothyroxine  (SYNTHROID ) 75 MCG tablet Take 75 mcg by mouth daily.    . lidocaine -prilocaine  (EMLA ) cream as directed. Before dialysis    . loperamide  (IMODIUM ) 2 MG capsule Take 1 capsule (2 mg total) by mouth as needed for diarrhea or loose stools. 30 capsule 0  . metoprolol  tartrate (LOPRESSOR ) 50 MG tablet Take 25 mg by mouth 2 (two) times daily.    . metoprolol  tartrate (LOPRESSOR ) 50 MG tablet 25 tablet 2 TIMES DAILY (route: oral)    . montelukast  (SINGULAIR ) 10 MG tablet Take 1 tablet (10 mg total) by mouth at bedtime. 30 tablet 2  . Multiple Minerals-Vitamins (CALCIUM  & VIT D3 BONE HEALTH PO) Take 1 tablet by mouth daily. 600 mg/ 25 mg    . rosuvastatin  (CRESTOR ) 40 MG tablet Take 40 mg by mouth daily.    Aaron Aas senna-docusate (SENOKOT-S) 8.6-50 MG tablet Take 2 tablets by mouth at bedtime as needed for mild constipation.    . sertraline  (ZOLOFT ) 100 MG tablet Take 100 mg by mouth daily.    . vitamin B-12 (CYANOCOBALAMIN ) 500 MCG tablet Take 500 mcg by mouth daily.    . azelastine  (ASTELIN ) 0.1 % nasal spray Place 2  sprays into both nostrils 2 (two) times daily. Use in each nostril as directed 30 mL 12  . fluticasone  (FLONASE ) 50 MCG/ACT nasal spray Place 2 sprays into both nostrils daily. 11 mL 5  . furosemide  (LASIX ) 40 MG tablet Take 1 tablet (40 mg total) by mouth 2 (two) times daily. 60 tablet 4   No current facility-administered medications on file prior to visit.    There are no Patient Instructions on file for this visit. No follow-ups on file.   Jovanni Eckhart E Primrose Oler, NP

## 2024-02-14 ENCOUNTER — Ambulatory Visit: Admitting: Physician Assistant

## 2024-02-14 ENCOUNTER — Other Ambulatory Visit: Payer: Self-pay | Admitting: Physician Assistant

## 2024-02-14 ENCOUNTER — Ambulatory Visit: Payer: Self-pay

## 2024-02-14 ENCOUNTER — Encounter: Payer: Self-pay | Admitting: Physician Assistant

## 2024-02-14 VITALS — BP 110/44 | HR 65 | Resp 16 | Ht 62.0 in | Wt 162.0 lb

## 2024-02-14 DIAGNOSIS — J449 Chronic obstructive pulmonary disease, unspecified: Secondary | ICD-10-CM

## 2024-02-14 DIAGNOSIS — E1169 Type 2 diabetes mellitus with other specified complication: Secondary | ICD-10-CM

## 2024-02-14 DIAGNOSIS — Z992 Dependence on renal dialysis: Secondary | ICD-10-CM

## 2024-02-14 DIAGNOSIS — I10 Essential (primary) hypertension: Secondary | ICD-10-CM | POA: Diagnosis not present

## 2024-02-14 DIAGNOSIS — F32A Depression, unspecified: Secondary | ICD-10-CM

## 2024-02-14 DIAGNOSIS — D638 Anemia in other chronic diseases classified elsewhere: Secondary | ICD-10-CM

## 2024-02-14 DIAGNOSIS — Z7689 Persons encountering health services in other specified circumstances: Secondary | ICD-10-CM

## 2024-02-14 DIAGNOSIS — J302 Other seasonal allergic rhinitis: Secondary | ICD-10-CM

## 2024-02-14 DIAGNOSIS — N186 End stage renal disease: Secondary | ICD-10-CM

## 2024-02-14 DIAGNOSIS — I480 Paroxysmal atrial fibrillation: Secondary | ICD-10-CM

## 2024-02-14 DIAGNOSIS — E039 Hypothyroidism, unspecified: Secondary | ICD-10-CM

## 2024-02-14 DIAGNOSIS — G4733 Obstructive sleep apnea (adult) (pediatric): Secondary | ICD-10-CM

## 2024-02-14 DIAGNOSIS — E669 Obesity, unspecified: Secondary | ICD-10-CM

## 2024-02-14 DIAGNOSIS — K219 Gastro-esophageal reflux disease without esophagitis: Secondary | ICD-10-CM

## 2024-02-14 NOTE — Telephone Encounter (Addendum)
 Pt is depressing/increased tearfulness. Very emotional.  Husband has been placed in a facility Children isnt being sensitive to her emotions.  First attempt reached niece and she did not call for patient. Second attempt called patient and no answer.   Third attempt; left vm.

## 2024-02-14 NOTE — Progress Notes (Signed)
 New patient visit  Patient: [name]   DOB: May 21, 1941   [name]  MRN: [id number] Visit Date: 02/14/2024  Today's healthcare provider: Blane Bunting, PA-C   Chief Complaint  Patient presents with   New Patient (Initial Visit)    NP/ Est Care   Subjective    [name] is a 83 y.o. female who presents today as a new patient to establish care.   Discussed the use of AI scribe software for clinical note transcription with the patient, who gave verbal consent to proceed.  History of Present Illness [name] is an 83 year old female with chronic kidney disease, heart disease, and COPD who presents for establishment of care and management of her chronic conditions. She is accompanied by her niece, [name].  She is on dialysis due to chronic kidney disease, which has improved her independence and ability to drive. Hormone injections are administered during dialysis. Her heart disease includes atrial fibrillation, managed with metoprolol  and apixaban . Blood pressure is variable, often low during dialysis, and managed with amlodipine  and hydralazine . She has COPD, using Breo for maintenance and albuterol  as a rescue inhaler, with a nebulizer for severe episodes. Anemia is treated with injections at dialysis and ferrous sulfate. She takes Nexium  for acid reflux due to a hiatal hernia and avoids exacerbating foods. Seasonal allergies are managed with Flonase  and montelukast . Levothyroxine  is taken for thyroid  issues. Arthritis pain is managed with Tylenol , and she takes allopurinol  for gout. Sertraline  is used for depression. She has sleep apnea and uses a CPAP machine. Socially, she lives alone, with her husband in care, and is supported by her niece, [name], who checks on her regularly.    Past Medical History:  Diagnosis Date   Actinic keratosis    Albuminuria    Anemia    Arthritis    Asthma    Basal cell carcinoma 04/12/2017   Above right lateral brow. Nodulocystic  type. EDC   Cancer (HCC)    skin   Cataract cortical, senile    CHF (congestive heart failure) (HCC)    Diabetes mellitus without complication (HCC)    GERD (gastroesophageal reflux disease)    Hemorrhoids    History of kidney stones    Hyperlipidemia    Hypertension    Hypothyroidism    Lyme disease    No kidney function    OSA (obstructive sleep apnea)    Osteoporosis    Osteoporosis    Reflux esophagitis    Steatohepatitis    Steatohepatitis    Past Surgical History:  Procedure Laterality Date   A/V FISTULAGRAM N/A 12/31/2023   Procedure: A/V Fistulagram;  Surgeon: Celso College, MD;  Location: [facility] INVASIVE CV LAB;  Service: Cardiovascular;  Laterality: N/A;   ABDOMINAL HYSTERECTOMY     APPENDECTOMY     AV FISTULA PLACEMENT Left 05/19/2022   Procedure: ARTERIOVENOUS (AV) FISTULA CREATION ( BRACHIAL CEPHALIC );  Surgeon: Jackquelyn Mass, MD;  Location: [facility] ORS;  Service: Vascular;  Laterality: Left;   CARDIAC CATHETERIZATION  1980   [facility]   CARDIAC CATHETERIZATION  08/13/2014   [facility]. no significant CAD, normal LVEDP.    CATARACT EXTRACTION     CHOLECYSTECTOMY     COLONOSCOPY     COLONOSCOPY WITH PROPOFOL  N/A 12/07/2016   Procedure: COLONOSCOPY WITH PROPOFOL ;  Surgeon: Deveron Fly, MD;  Location: [facility] ENDOSCOPY;  Service: Endoscopy;  Laterality: N/A;   DIALYSIS/PERMA CATHETER INSERTION N/A 12/31/2023   Procedure: DIALYSIS/PERMA  CATHETER INSERTION;  Surgeon: Celso College, MD;  Location: [facility] INVASIVE CV LAB;  Service: Cardiovascular;  Laterality: N/A;   ESOPHAGOGASTRODUODENOSCOPY     ESOPHAGOGASTRODUODENOSCOPY (EGD) WITH PROPOFOL  N/A 12/07/2016   Procedure: ESOPHAGOGASTRODUODENOSCOPY (EGD) WITH PROPOFOL ;  Surgeon: Deveron Fly, MD;  Location: [facility] ENDOSCOPY;  Service: Endoscopy;  Laterality: N/A;   ESOPHAGOGASTRODUODENOSCOPY (EGD) WITH PROPOFOL  N/A 01/07/2018   Procedure: ESOPHAGOGASTRODUODENOSCOPY (EGD) WITH PROPOFOL ;  Surgeon: Deveron Fly, MD;  Location: [facility]  ENDOSCOPY;  Service: Endoscopy;  Laterality: N/A;   EYE SURGERY     HEMATOMA EVACUATION Right 08/02/2023   Procedure: EVACUATION HEMATOMA;  Surgeon: Alben Alma, MD;  Location: [facility] ORS;  Service: General;  Laterality: Right;   HEMORRHOID SURGERY     PARTIAL HYSTERECTOMY     THYROIDECTOMY N/A 08/25/2019   Procedure: THYROIDECTOMY EXTRACTION OF SUBTOTAL COMPONENT; PARATHYROID  AUTOTRANSPLANT X1;  Surgeon: Mercy Stall, MD;  Location: [facility] ORS;  Service: General;  Laterality: N/A;  With Nerve Monitoring(RLN)   TONSILLECTOMY     Family Status  Relation Name Status   Mother  Deceased   Father  Deceased  No partnership data on file   Family History  Problem Relation Age of Onset   Cancer Mother        breast   Kidney disease Mother    Breast cancer Mother    Hypertension Father    Heart attack Father    Social History   Socioeconomic History   Marital status: Married    Spouse name: Not on file   Number of children: Not on file   Years of education: Not on file   Highest education level: Associate degree: academic program  Occupational History   Not on file  Tobacco Use   Smoking status: Never   Smokeless tobacco: Never  Vaping Use   Vaping status: Never Used  Substance and Sexual Activity   Alcohol use: No   Drug use: No   Sexual activity: Not on file  Other Topics Concern   Not on file  Social History Narrative   Lives at home with husband .   Social Drivers of Corporate investment banker Strain: Low Risk  (06/12/2023)   Overall Financial Resource Strain (CARDIA)    Difficulty of Paying Living Expenses: Not hard at all  Food Insecurity: No Food Insecurity (12/22/2023)   Hunger Vital Sign    Worried About Running Out of Food in the Last Year: Never true    Ran Out of Food in the Last Year: Never true  Transportation Needs: No Transportation Needs (12/22/2023)   PRAPARE - Administrator, Civil Service (Medical): No    Lack of Transportation  (Non-Medical): No  Physical Activity: Insufficiently Active (06/12/2023)   Exercise Vital Sign    Days of Exercise per Week: 7 days    Minutes of Exercise per Session: 20 min  Stress: No Stress Concern Present (06/12/2023)   [employer] of Occupational Health - Occupational Stress Questionnaire    Feeling of Stress : Only a little  Social Connections: Moderately Isolated (12/22/2023)   Social Connection and Isolation Panel [NHANES]    Frequency of Communication with Friends and Family: More than three times a week    Frequency of Social Gatherings with Friends and Family: More than three times a week    Attends Religious Services: Never    Database administrator or Organizations: No    Attends Banker Meetings: Never    Marital  Status: Married   Outpatient Medications Prior to Visit  Medication Sig   acetaminophen  (TYLENOL ) 500 MG tablet Take 1,000 mg by mouth every 6 (six) hours as needed for mild pain or moderate pain.   albuterol  (VENTOLIN  HFA) 108 (90 Base) MCG/ACT inhaler Inhale 2 puffs into the lungs every 6 (six) hours as needed for wheezing or shortness of breath.   allopurinol  (ZYLOPRIM ) 100 MG tablet Take 100 mg by mouth daily.   amLODipine  (NORVASC ) 5 MG tablet Take 1 tablet (5 mg total) by mouth daily.   apixaban  (ELIQUIS ) 2.5 MG TABS tablet Take 1 tablet (2.5 mg total) by mouth 2 (two) times daily.   epoetin  alfa-epbx (RETACRIT ) 4000 UNIT/ML injection Inject 1 mL (4,000 Units total) into the vein every Monday, Wednesday, and Friday at 6 PM.   esomeprazole  (NEXIUM ) 40 MG capsule Take 1 capsule (40 mg total) by mouth daily before breakfast.   ferrous sulfate 325 (65 FE) MG tablet Take 325 mg by mouth daily with breakfast.   fluticasone  furoate-vilanterol (BREO ELLIPTA ) 200-25 MCG/ACT AEPB Inhale 1 puff into the lungs daily.   folic acid  (FOLVITE ) 1 MG tablet Take 1 tablet (1 mg total) by mouth daily.   hydrALAZINE  (APRESOLINE ) 100 MG tablet Take 1 tablet (100  mg total) by mouth 3 (three) times daily.   ipratropium (ATROVENT ) 0.06 % nasal spray Place 2 sprays into both nostrils 4 (four) times daily.   ipratropium-albuterol  (DUONEB) 0.5-2.5 (3) MG/3ML SOLN Take 3 mLs by nebulization every 6 (six) hours as needed (SOB).   levothyroxine  (SYNTHROID ) 75 MCG tablet Take 75 mcg by mouth daily.   lidocaine -prilocaine  (EMLA ) cream as directed. Before dialysis   loperamide  (IMODIUM ) 2 MG capsule Take 1 capsule (2 mg total) by mouth as needed for diarrhea or loose stools.   metoprolol  tartrate (LOPRESSOR ) 50 MG tablet Take 25 mg by mouth 2 (two) times daily.   metoprolol  tartrate (LOPRESSOR ) 50 MG tablet 25 tablet 2 TIMES DAILY (route: oral)   montelukast  (SINGULAIR ) 10 MG tablet Take 1 tablet (10 mg total) by mouth at bedtime.   Multiple Minerals-Vitamins (CALCIUM  & VIT D3 BONE HEALTH PO) Take 1 tablet by mouth daily. 600 mg/ 25 mg   rosuvastatin  (CRESTOR ) 40 MG tablet Take 40 mg by mouth daily.   senna-docusate (SENOKOT-S) 8.6-50 MG tablet Take 2 tablets by mouth at bedtime as needed for mild constipation.   sertraline  (ZOLOFT ) 100 MG tablet Take 100 mg by mouth daily.   vitamin B-12 (CYANOCOBALAMIN ) 500 MCG tablet Take 500 mcg by mouth daily.   azelastine  (ASTELIN ) 0.1 % nasal spray Place 2 sprays into both nostrils 2 (two) times daily. Use in each nostril as directed   fluticasone  (FLONASE ) 50 MCG/ACT nasal spray Place 2 sprays into both nostrils daily.   furosemide  (LASIX ) 40 MG tablet Take 1 tablet (40 mg total) by mouth 2 (two) times daily.   No facility-administered medications prior to visit.   Allergies  Allergen Reactions   Ace Inhibitors     Other reaction(s): Unknown   Egg-Derived Products Diarrhea   Other     Other reaction(s): Other (See Comments) Eggs   Prednisone      Other reaction(s): Other (See Comments) joint pain   Risedronate     Other reaction(s): Other (See Comments)   Sulfa Antibiotics Itching and Swelling    Other  reaction(s): Other (See Comments)   Sulfasalazine Other (See Comments)    Immunization History  Administered Date(s) Administered   Fluad Quad(high Dose  65+) 08/26/2019   Influenza Inj Mdck Quad Pf 08/15/2017, 08/12/2020, 06/28/2022   Influenza, Mdck, Trivalent,PF 6+ MOS(egg free) 07/25/2023   Influenza-Unspecified 08/15/2017, 08/15/2017, 08/15/2018, 09/02/2021   Pneumococcal Conjugate-13 03/26/2017   Pneumococcal Polysaccharide-23 08/26/2019   Tdap 09/21/2015    Health Maintenance  Topic Date Due   Medicare Annual Wellness (AWV)  Never done   COVID-19 Vaccine (1) Never done   FOOT EXAM  Never done   OPHTHALMOLOGY EXAM  Never done   Zoster Vaccines- Shingrix (1 of 2) Never done   DEXA SCAN  Never done   HEMOGLOBIN A1C  12/13/2022   INFLUENZA VACCINE  05/02/2024   DTaP/Tdap/Td (2 - Td or Tdap) 09/20/2025   Pneumonia Vaccine 73+ Years old  Completed   HPV VACCINES  Aged Out   Meningococcal B Vaccine  Aged Out    Patient Care Team: Siraj Dermody, PA-C as PCP - General (Physician Assistant) Wenona Hamilton, MD as PCP - Cardiology (Cardiology) Loreatha Rodney, MD as Consulting Physician (Oncology) Cleve Dale, MD as Consulting Physician (Pulmonary Disease)  Review of Systems Except see HPI       Objective    BP (!) 110/44 (BP Location: Right Arm, Patient Position: Sitting, Cuff Size: Normal)   Pulse 65   Resp 16   Ht 5\' 2"  (1.575 m)   Wt 162 lb (73.5 kg)   SpO2 96%   BMI 29.63 kg/m     Physical Exam Vitals reviewed.  Constitutional:      General: She is not in acute distress.    Appearance: Normal appearance. She is well-developed. She is not diaphoretic.  HENT:     Head: Normocephalic and atraumatic.  Eyes:     General: No scleral icterus.    Conjunctiva/sclera: Conjunctivae normal.  Neck:     Thyroid : No thyromegaly.  Cardiovascular:     Rate and Rhythm: Normal rate and regular rhythm.     Pulses: Normal pulses.     Heart sounds: Normal heart  sounds. No murmur heard. Pulmonary:     Effort: Pulmonary effort is normal. No respiratory distress.     Breath sounds: Normal breath sounds. No wheezing, rhonchi or rales.  Musculoskeletal:     Cervical back: Neck supple.     Right lower leg: No edema.     Left lower leg: No edema.  Lymphadenopathy:     Cervical: No cervical adenopathy.  Skin:    General: Skin is warm and dry.     Findings: No rash.  Neurological:     Mental Status: She is alert and oriented to person, place, and time. Mental status is at baseline.  Psychiatric:        Mood and Affect: Mood normal.        Behavior: Behavior normal.     Depression Screen    08/14/2023   11:17 AM  PHQ 2/9 Scores  PHQ - 2 Score 0   No results found for any visits on 02/14/24.  Assessment & Plan      Assessment & Plan End-stage renal disease on dialysis Managed with dialysis, improving independence and well-being. Fluid management and anemia addressed with erythropoietin . - Continue dialysis as scheduled. - Administer erythropoietin  injections during dialysis. - Monitor fluid removal during dialysis.  MWF  Anemia in chronic kidney disease Managed with erythropoietin  injections and iron supplementation. - Continue erythropoietin  injections during dialysis. - Continue iron supplementation.  Hypertension Chronic Fluctuating blood pressure managed around dialysis sessions. Medications include amlodipine , hydralazine , and metoprolol . -  Continue amlodipine  5mg , hydralazine  100, and metoprolol  50mg . - Avoid taking blood pressure medication on the morning of dialysis. - Monitor blood pressure regularly.  Atrial fibrillation Managed with metoprolol  for rhythm control and apixaban  for anticoagulation. - Continue metoprolol  for rhythm control. - Continue apixaban  2.5 bid for anticoagulation.  Chronic obstructive pulmonary disease (COPD) Managed with Breo for maintenance and albuterol  for rescue. Nebulizer available if  needed. - Continue Breo for maintenance. - Use albuterol  as a rescue inhaler as needed. - Dispose of any unused or incorrect inhalers. - Utilize nebulizer with albuterol  if necessary.  Asthma Chronic Coexists with COPD, managed with Breo and albuterol . - Continue Breo for maintenance. - Use albuterol  as a rescue inhaler as needed. Will follow-up  Gastroesophageal reflux disease (GERD) Managed with Nexium . Symptoms worsen without medication. Lifestyle modifications advised. - Continue Nexium . - Avoid spicy foods, chocolate, caffeine, and acidic foods. - Avoid lying down immediately after eating. Will follow-up  Hypothyroidism Managed with levothyroxine . Regular thyroid  function monitoring necessary. - Continue levothyroxine  . - Monitor thyroid  function periodically. Will follow-up  Depression Chronic Managed with sertraline  100. Emotional support and counseling encouraged. - Continue sertraline . - Encourage regular counseling and emotional support. - Consider non-pharmacological approaches for insomnia.  Obstructive sleep apnea Managed with CPAP. Recent sleep study confirmed settings. - Continue CPAP therapy. - Follow up with sleep specialist as needed.  Encounter to establish care Welcomed to our clinic Reviewed past medical hx, social hx, family hx and surgical hx Pt advised to send all vaccination records or screening   ESRD on dialysis (HCC)  - CBC with Differential/Platelet - Comprehensive metabolic panel with GFR - Hemoglobin A1c - Lipid panel - TSH  Essential hypertension - CBC with Differential/Platelet - Comprehensive metabolic panel with GFR - Hemoglobin A1c - Lipid panel - TSH   Type 2 diabetes mellitus with obesity (HCC)  - CBC with Differential/Platelet - Comprehensive metabolic panel with GFR - Hemoglobin A1c - Lipid panel - TSH  Seasonal allergies Allergic Rhinitis: - Positive skin test 11/2022: [place] grass, dust mites, mold -  Avoidance measures discussed. - Use nasal saline rinses before nose sprays such as with Neilmed Sinus Rinse bottle.  Use distilled water.   - Use Flonase  2 sprays each nostril daily. Aim upward and outward. - Use Azelastine  1-2 sprays each nostril twice daily as needed. Aim upward and outward. - Use Monelukast 10 mg daily.   Rosuvastatin /hld Allopurinol  /gout Will discuss at follow-up   Return in about 6 weeks (around 03/27/2024) for chronic disease f/u.    The patient was advised to call back or seek an in-person evaluation if the symptoms worsen or if the condition fails to improve as anticipated.  I discussed the assessment and treatment plan with the patient. The patient was provided an opportunity to ask questions and all were answered. The patient agreed with the plan and demonstrated an understanding of the instructions.  I, Elliona Doddridge, PA-C have reviewed all documentation for this visit. The documentation on  02/14/2024   for the exam, diagnosis, procedures, and orders are all accurate and complete.  Blane Bunting, [facility], MMS [facility] [phone] (phone) [phone] (fax)  [facility]

## 2024-02-14 NOTE — Telephone Encounter (Unsigned)
 Copied from CRM [id number]. Topic: Clinical - Medication Refill >> Feb 14, 2024  4:30 PM Felizardo Hotter wrote: Medication: sertraline  (ZOLOFT ) 100 MG tablet  Has the patient contacted their pharmacy? Yes (Agent: If no, request that the patient contact the pharmacy for the refill. If patient does not wish to contact the pharmacy document the reason why and proceed with request.) (Agent: If yes, when and what did the pharmacy advise?)  This is the patient's preferred pharmacy:  [facility] - [place], [place] - [address] [place] [address] Phone: [phone] Fax: [phone]  Is this the correct pharmacy for this prescription? Yes If no, delete pharmacy and type the correct one.   Has the prescription been filled recently? Yes  Is the patient out of the medication? Yes  Has the patient been seen for an appointment in the last year OR does the patient have an upcoming appointment? Yes  Can we respond through MyChart? Yes  Agent: Please be advised that Rx refills may take up to 3 business days. We ask that you follow-up with your pharmacy.

## 2024-02-18 ENCOUNTER — Other Ambulatory Visit: Payer: Self-pay

## 2024-02-18 DIAGNOSIS — D631 Anemia in chronic kidney disease: Secondary | ICD-10-CM

## 2024-02-19 ENCOUNTER — Inpatient Hospital Stay: Payer: Medicare Other

## 2024-02-19 ENCOUNTER — Ambulatory Visit: Payer: Medicare Other | Admitting: Internal Medicine

## 2024-02-19 ENCOUNTER — Inpatient Hospital Stay: Payer: Medicare Other | Admitting: Nurse Practitioner

## 2024-02-19 ENCOUNTER — Encounter (INDEPENDENT_AMBULATORY_CARE_PROVIDER_SITE_OTHER): Payer: Self-pay

## 2024-02-22 LAB — LIPID PANEL
Chol/HDL Ratio: 3.3 ratio (ref 0.0–4.4)
Cholesterol, Total: 132 mg/dL (ref 100–199)
HDL: 40 mg/dL (ref >39)
LDL Chol Calc (NIH): 62 mg/dL (ref 0–99)
Triglycerides: 176 mg/dL — ABNORMAL HIGH (ref 0–149)
VLDL Cholesterol Cal: 30 mg/dL (ref 5–40)

## 2024-02-22 LAB — COMPREHENSIVE METABOLIC PANEL WITH GFR
ALT: 11 IU/L (ref 0–32)
AST: 19 IU/L (ref 0–40)
Albumin: 3.8 g/dL (ref 3.7–4.7)
Alkaline Phosphatase: 100 IU/L (ref 44–121)
BUN/Creatinine Ratio: 10 — ABNORMAL LOW (ref 12–28)
BUN: 25 mg/dL (ref 8–27)
Bilirubin Total: 0.5 mg/dL (ref 0.0–1.2)
CO2: 26 mmol/L (ref 20–29)
Calcium: 8.9 mg/dL (ref 8.7–10.3)
Chloride: 100 mmol/L (ref 96–106)
Creatinine, Ser: 2.59 mg/dL — ABNORMAL HIGH (ref 0.57–1.00)
Globulin, Total: 2.3 g/dL (ref 1.5–4.5)
Glucose: 89 mg/dL (ref 70–99)
Potassium: 3.5 mmol/L (ref 3.5–5.2)
Sodium: 142 mmol/L (ref 134–144)
Total Protein: 6.1 g/dL (ref 6.0–8.5)
eGFR: 18 mL/min/{1.73_m2} — ABNORMAL LOW (ref >59)

## 2024-02-22 LAB — CBC WITH DIFFERENTIAL/PLATELET
Basophils Absolute: 0.1 10*3/uL (ref 0.0–0.2)
Basos: 1 %
EOS (ABSOLUTE): 0.4 10*3/uL (ref 0.0–0.4)
Eos: 5 %
Hematocrit: 28 % — ABNORMAL LOW (ref 34.0–46.6)
Hemoglobin: 9 g/dL — ABNORMAL LOW (ref 11.1–15.9)
Immature Grans (Abs): 0 10*3/uL (ref 0.0–0.1)
Immature Granulocytes: 0 %
Lymphocytes Absolute: 2.1 10*3/uL (ref 0.7–3.1)
Lymphs: 28 %
MCH: 28.9 pg (ref 26.6–33.0)
MCHC: 32.1 g/dL (ref 31.5–35.7)
MCV: 90 fL (ref 79–97)
Monocytes Absolute: 0.5 10*3/uL (ref 0.1–0.9)
Monocytes: 7 %
Neutrophils Absolute: 4.4 10*3/uL (ref 1.4–7.0)
Neutrophils: 59 %
Platelets: 163 10*3/uL (ref 150–450)
RBC: 3.11 x10E6/uL — ABNORMAL LOW (ref 3.77–5.28)
RDW: 14.1 % (ref 11.7–15.4)
WBC: 7.5 10*3/uL (ref 3.4–10.8)

## 2024-02-22 LAB — HEMOGLOBIN A1C
Est. average glucose Bld gHb Est-mCnc: 108 mg/dL
Hgb A1c MFr Bld: 5.4 % (ref 4.8–5.6)

## 2024-02-22 LAB — TSH: TSH: 3.51 u[IU]/mL (ref 0.450–4.500)

## 2024-02-26 ENCOUNTER — Ambulatory Visit

## 2024-02-26 ENCOUNTER — Ambulatory Visit: Admitting: Nurse Practitioner

## 2024-02-26 ENCOUNTER — Ambulatory Visit: Payer: Self-pay | Admitting: Physician Assistant

## 2024-02-26 ENCOUNTER — Other Ambulatory Visit

## 2024-03-03 ENCOUNTER — Other Ambulatory Visit: Payer: Self-pay | Admitting: Cardiology

## 2024-03-04 ENCOUNTER — Ambulatory Visit: Payer: Self-pay

## 2024-03-04 NOTE — Telephone Encounter (Signed)
 Chief Complaint: SOB Symptoms: productive white cough Frequency: x 2 weeks Pertinent Negatives: Patient denies fever, URI sx, severe SOB, CP Disposition: [] ED /[] Urgent Care (no appt availability in office) / [x] Appointment(In office/virtual)/ []  [place] [facility]/ [] Home Care/ [] Refused Recommended Disposition /[] [place] [facility]/ []  Follow-up with PCP Additional Notes: Pt caregiver Aleda Hurl calling with pt in background c/o SOB and nocturnal hypoxia x 2 weeks. Pt reports also has been having productive white cough and has been waking up in the middle of the night with SOB and noticing hypoxia (as low as 81 on CPAP). Pt states that she self recovers and SpO2 is below on RA. Pt reports taking medications as instructed, as well as duoneb. Triager reinforced duoneb usage. Triager does not appreciate audible SOB/wheezing during call. Pt is speaking in full sentences. Scheduled patient on the next available Acute Vist appt on 03/06/2024 with alternate provider. Caregiver Patient verbalized understanding and to call back with worsening symptoms.        Copied from CRM [id number]. Topic: Clinical - Red Word Triage >> Mar 04, 2024  4:35 PM Everette C wrote: [facility] that prompted transfer to Nurse Triage: Caller/Agency: Franceen Inches  Callback Number: [phone] Service Requested: Physical Therapy Any new concerns about the patient? Yes  Patient's resting HR is 57 bpm,  Patient had/has sob even with wearing CPAP regularly When patient checks their oxygen it has been as low as 81% Reason for Disposition  Worsening sputum (i.e., increased amount of sputum or change to yellow-green color)  Answer Assessment - Initial Assessment Questions E2C2 Pulmonary Triage - Initial Assessment Questions "Chief Complaint (e.g., cough, sob, wheezing, fever, chills, sweat or additional symptoms) *Go to specific symptom protocol after initial questions. SOB despite CPAP at night Endorses prodiucive white  cough  "How long have symptoms been present?" 2 weeks  Have you tested for COVID or Flu? Note: If not, ask patient if a home test can be taken. If so, instruct patient to call back for positive results. No  MEDICINES:   "Have you used any OTC meds to help with symptoms?" No If yes, ask "What medications?" N/a  "Have you used your inhalers/maintenance medication?" Yes If yes, "What medications?" Breo 1 puffs twice a day DuoNeb - twice a day recently that provides relief  If inhaler, ask "How many puffs and how often?" Note: Review instructions on medication in the chart. See above  OXYGEN: "Do you wear supplemental oxygen?" Yes If yes, "How many liters are you supposed to use?" 2L PRN  "Do you monitor your oxygen levels?" Yes If yes, "What is your reading (oxygen level) today?" 95 RA Pt reports SOB at night and wakes up and checks her SpO2 and has seen it as low as 81 - pt states CPAP is on and is functioning correctly  "What is your usual oxygen saturation reading?"  (Note: Pulmonary O2 sats should be 90% or greater) Mid-90s   1. MAIN CONCERN OR SYMPTOM: "What's your main concern?" (e.g., low oxygen level, breathing difficulty) "What question do you have?"     hypoxia 2.  OXYGEN EQUIPMENT:  Are you having trouble with your oxygen equipment?  (e.g., cannula, mask, tubing, tank, concentrator)     denies  8. BREATHING DIFFICULTY: "Are you having any difficulty breathing?" If Yes, ask: "How bad is it?"  (e.g., none, mild, moderate, severe)   - MILD: No SOB at rest, mild SOB with walking, speaks normally in sentences, able to lie down, no retractions,  pulse < 100.   - MODERATE: SOB at rest, SOB with minimal exertion and prefers to sit, cannot lie down flat, speaks in phrases, mild retractions, audible wheezing, pulse 100-120.   - SEVERE: Very SOB at rest, speaks in single words, struggling to breathe, sitting hunched forward, retractions, pulse > 120      SOB at night 9. OTHER  SYMPTOMS: "Do you have any other symptoms?" (e.g., fever, change in sputum)     White sputum  Protocols used: COPD Oxygen Monitoring and Hypoxia-A-AH

## 2024-03-05 ENCOUNTER — Emergency Department
Admission: EM | Admit: 2024-03-05 | Discharge: 2024-03-05 | Disposition: A | Discharge status: Home / Self Care | Attending: Emergency Medicine | Admitting: Emergency Medicine

## 2024-03-05 ENCOUNTER — Emergency Department

## 2024-03-05 ENCOUNTER — Other Ambulatory Visit: Payer: Self-pay

## 2024-03-05 DIAGNOSIS — N186 End stage renal disease: Secondary | ICD-10-CM | POA: Insufficient documentation

## 2024-03-05 DIAGNOSIS — E1122 Type 2 diabetes mellitus with diabetic chronic kidney disease: Secondary | ICD-10-CM | POA: Insufficient documentation

## 2024-03-05 DIAGNOSIS — J45909 Unspecified asthma, uncomplicated: Secondary | ICD-10-CM | POA: Diagnosis not present

## 2024-03-05 DIAGNOSIS — I132 Hypertensive heart and chronic kidney disease with heart failure and with stage 5 chronic kidney disease, or end stage renal disease: Secondary | ICD-10-CM | POA: Insufficient documentation

## 2024-03-05 DIAGNOSIS — J441 Chronic obstructive pulmonary disease with (acute) exacerbation: Secondary | ICD-10-CM | POA: Diagnosis not present

## 2024-03-05 DIAGNOSIS — E039 Hypothyroidism, unspecified: Secondary | ICD-10-CM | POA: Insufficient documentation

## 2024-03-05 DIAGNOSIS — I509 Heart failure, unspecified: Secondary | ICD-10-CM | POA: Diagnosis not present

## 2024-03-05 DIAGNOSIS — Z7901 Long term (current) use of anticoagulants: Secondary | ICD-10-CM | POA: Diagnosis not present

## 2024-03-05 DIAGNOSIS — R059 Cough, unspecified: Secondary | ICD-10-CM | POA: Diagnosis not present

## 2024-03-05 DIAGNOSIS — R0602 Shortness of breath: Secondary | ICD-10-CM | POA: Diagnosis present

## 2024-03-05 DIAGNOSIS — Z85828 Personal history of other malignant neoplasm of skin: Secondary | ICD-10-CM | POA: Insufficient documentation

## 2024-03-05 DIAGNOSIS — Z992 Dependence on renal dialysis: Secondary | ICD-10-CM | POA: Diagnosis not present

## 2024-03-05 DIAGNOSIS — E876 Hypokalemia: Secondary | ICD-10-CM | POA: Insufficient documentation

## 2024-03-05 LAB — TROPONIN I (HIGH SENSITIVITY)
Troponin I (High Sensitivity): 26 ng/L — ABNORMAL HIGH (ref <18)
Troponin I (High Sensitivity): 28 ng/L — ABNORMAL HIGH (ref <18)

## 2024-03-05 LAB — RESP PANEL BY RT-PCR (RSV, FLU A&B, COVID)  RVPGX2
Influenza A by PCR: NEGATIVE
Influenza B by PCR: NEGATIVE
Resp Syncytial Virus by PCR: NEGATIVE
SARS Coronavirus 2 by RT PCR: NEGATIVE

## 2024-03-05 LAB — CBC
HCT: 24.8 % — ABNORMAL LOW (ref 36.0–46.0)
Hemoglobin: 8 g/dL — ABNORMAL LOW (ref 12.0–15.0)
MCH: 29.7 pg (ref 26.0–34.0)
MCHC: 32.3 g/dL (ref 30.0–36.0)
MCV: 92.2 fL (ref 80.0–100.0)
Platelets: 136 10*3/uL — ABNORMAL LOW (ref 150–400)
RBC: 2.69 MIL/uL — ABNORMAL LOW (ref 3.87–5.11)
RDW: 15.9 % — ABNORMAL HIGH (ref 11.5–15.5)
WBC: 5.8 10*3/uL (ref 4.0–10.5)
nRBC: 0 % (ref 0.0–0.2)

## 2024-03-05 LAB — BASIC METABOLIC PANEL WITH GFR
Anion gap: 14 (ref 5–15)
BUN: 14 mg/dL (ref 8–23)
CO2: 29 mmol/L (ref 22–32)
Calcium: 8.4 mg/dL — ABNORMAL LOW (ref 8.9–10.3)
Chloride: 94 mmol/L — ABNORMAL LOW (ref 98–111)
Creatinine, Ser: 1.33 mg/dL — ABNORMAL HIGH (ref 0.44–1.00)
GFR, Estimated: 40 mL/min — ABNORMAL LOW (ref >60)
Glucose, Bld: 173 mg/dL — ABNORMAL HIGH (ref 70–99)
Potassium: 2.9 mmol/L — ABNORMAL LOW (ref 3.5–5.1)
Sodium: 137 mmol/L (ref 135–145)

## 2024-03-05 MED ORDER — ALBUTEROL SULFATE (2.5 MG/3ML) 0.083% IN NEBU
5.0000 mg | INHALATION_SOLUTION | Freq: Once | RESPIRATORY_TRACT | Status: AC
  Administered 2024-03-05: 5 mg via RESPIRATORY_TRACT
  Filled 2024-03-05: qty 6

## 2024-03-05 MED ORDER — GUAIFENESIN ER 600 MG PO TB12: 600.0000 mg | ORAL_TABLET | Freq: Two times a day (BID) | ORAL | 0 refills | Status: AC

## 2024-03-05 MED ORDER — PREDNISONE 20 MG PO TABS
40.0000 mg | ORAL_TABLET | Freq: Every day | ORAL | 0 refills | Status: DC
Start: 2024-03-05 — End: 2024-03-12

## 2024-03-05 MED ORDER — DOXYCYCLINE HYCLATE 100 MG PO TABS
100.0000 mg | ORAL_TABLET | Freq: Once | ORAL | Status: AC
  Administered 2024-03-05: 100 mg via ORAL
  Filled 2024-03-05: qty 1

## 2024-03-05 MED ORDER — DOXYCYCLINE HYCLATE 100 MG PO CAPS: 100.0000 mg | ORAL_CAPSULE | Freq: Two times a day (BID) | ORAL | 0 refills | Status: AC

## 2024-03-05 MED ORDER — METHYLPREDNISOLONE SODIUM SUCC 125 MG IJ SOLR
125.0000 mg | INTRAMUSCULAR | Status: AC
  Administered 2024-03-05: 125 mg via INTRAVENOUS
  Filled 2024-03-05: qty 2

## 2024-03-05 MED ORDER — IPRATROPIUM-ALBUTEROL 0.5-2.5 (3) MG/3ML IN SOLN
3.0000 mL | Freq: Once | RESPIRATORY_TRACT | Status: AC
  Administered 2024-03-05: 3 mL via RESPIRATORY_TRACT
  Filled 2024-03-05: qty 3

## 2024-03-05 MED ORDER — POTASSIUM CHLORIDE CRYS ER 20 MEQ PO TBCR
40.0000 meq | EXTENDED_RELEASE_TABLET | Freq: Once | ORAL | Status: AC
  Administered 2024-03-05: 40 meq via ORAL
  Filled 2024-03-05: qty 2

## 2024-03-05 NOTE — Telephone Encounter (Signed)
 This patient is [name] the patient.  She has multiple comorbidities to include ESRD, cardiac disease with chronic heart failure and other issues.  My impression is that she would be best evaluated in the emergency room.  She required admission in April for similar episode.  Defer to Dr. Auston Left as to patient disposition.

## 2024-03-05 NOTE — ED Notes (Signed)
 Pt bed adjusted and gown tied in back. No other comfort measures requested at this time.

## 2024-03-05 NOTE — ED Triage Notes (Signed)
 Pt comes in via pov with complaints of shortness of breath. Pt states that her oxygen, and HR have been up and down the past few. Pt has no complaints of chest pain, but at times she feels chest tightness, none reported at this time. Pt with shortness of breath with exertion. Pt had dialysis this morning, and completed her treatment. Pt is alert and oriented x 4 with no signs of acute distress at this time.

## 2024-03-05 NOTE — Telephone Encounter (Signed)
 Spoke to Aldine Humphreys on [employer]. She reports patient is at dialysis. She will take patient to to the ED around 4pm. NFN.

## 2024-03-05 NOTE — ED Provider Notes (Signed)
 [facility] Provider Note    Event Date/Time   First MD Initiated Contact with Patient 03/05/24 1727     (approximate)   History   Chief Complaint: Shortness of Breath   HPI  ALITZEL COOKSON is a 83 y.o. female with a history of COPD, paroxysmal atrial fibrillation, GERD, ESRD on hemodialysis, hypertension, diabetes who comes ED complaining of shortness of breath for the past few days, waxing waning symptoms.  Denies chest pain or fever.  Does have increased cough.  Had a complete dialysis session this morning.        Past Medical History:  Diagnosis Date   Actinic keratosis    Albuminuria    Anemia    Arthritis    Asthma    Basal cell carcinoma 04/12/2017   Above right lateral brow. Nodulocystic type. EDC   Cancer (HCC)    skin   Cataract cortical, senile    CHF (congestive heart failure) (HCC)    Diabetes mellitus without complication (HCC)    GERD (gastroesophageal reflux disease)    Hemorrhoids    History of kidney stones    Hyperlipidemia    Hypertension    Hypothyroidism    Lyme disease    No kidney function    OSA (obstructive sleep apnea)    Osteoporosis    Osteoporosis    Reflux esophagitis    Steatohepatitis    Steatohepatitis     Current Outpatient Rx   Order #: [id number] Class: Normal   Order #: [id number] Class: Normal   Order #: [id number] Class: Normal   Order #: [id number] Class: Historical Med   Order #: [id number] Class: Normal   Order #: [id number] Class: Historical Med   Order #: [id number] Class: No Print   Order #: [id number] Class: Normal   Order #: [id number] Class: Normal   Order #: [id number] Class: No Print   Order #: [id number] Class: Normal   Order #: [id number] Class: Historical Med   Order #: [id number] Class: Normal   Order #: [id number] Class: Normal   Order #: [id number] Class: OTC   Order #: [id number] Class: Normal   Order #: [id number] Class: Normal   Order #: [id number] Class: Normal   Order #: [id number] Class: Normal    Order #: [id number] Class: Historical Med   Order #: [id number] Class: Historical Med   Order #: [id number] Class: Normal   Order #: [id number] Class: Historical Med   Order #: [id number] Class: Historical Med   Order #: [id number] Class: Normal   Order #: [id number] Class: Historical Med   Order #: [id number] Class: Historical Med   Order #: [id number] Class: OTC   Order #: [id number] Class: Normal   Order #: [id number] Class: Historical Med    Past Surgical History:  Procedure Laterality Date   A/V FISTULAGRAM N/A 12/31/2023   Procedure: A/V Fistulagram;  Surgeon: Celso College, MD;  Location: [facility] INV[facility]IVE [facility];  Service: Cardiovascular;  Laterality: N/A;   ABDOMINAL HYSTERECTOMY     APPENDECTOMY     AV FISTULA PLACEMENT Left 05/19/2022   Procedure: ARTERIOVENOUS (AV) FISTULA CREATION ( BRACHIAL CEPHALIC );  Surgeon: Jackquelyn Mass, MD;  Location: [facility] ORS;  Service: Vascular;  Laterality: Left;   CARDIAC CATHETERIZATION  1980   [facility]   CARDIAC CATHETERIZATION  08/13/2014   [facility]. no significant CAD, normal LVEDP.    CATARACT EXTRACTION     CHOLECYSTECTOMY     COLONOSCOPY     COLONOSCOPY WITH PROPOFOL  N/A 12/07/2016   Procedure: COLONOSCOPY WITH PROPOFOL ;  Surgeon: Deveron Fly, MD;  Location: [facility] ENDOSCOPY;  Service: Endoscopy;  Laterality: N/A;  DIALYSIS/PERMA CATHETER INSERTION N/A 12/31/2023   Procedure: DIALYSIS/PERMA CATHETER INSERTION;  Surgeon: Celso College, MD;  Location: [facility];  Service: Cardiovascular;  Laterality: N/A;   ESOPHAGOGASTRODUODENOSCOPY     ESOPHAGOGASTRODUODENOSCOPY (EGD) WITH PROPOFOL  N/A 12/07/2016   Procedure: ESOPHAGOGASTRODUODENOSCOPY (EGD) WITH PROPOFOL ;  Surgeon: Deveron Fly, MD;  Location: [facility] ENDOSCOPY;  Service: Endoscopy;  Laterality: N/A;   ESOPHAGOGASTRODUODENOSCOPY (EGD) WITH PROPOFOL  N/A 01/07/2018   Procedure: ESOPHAGOGASTRODUODENOSCOPY (EGD) WITH PROPOFOL ;  Surgeon: Deveron Fly, MD;  Location: [facility] ENDOSCOPY;  Service: Endoscopy;   Laterality: N/A;   EYE SURGERY     HEMATOMA EVACUATION Right 08/02/2023   Procedure: EVACUATION HEMATOMA;  Surgeon: Alben Alma, MD;  Location: [facility] ORS;  Service: General;  Laterality: Right;   HEMORRHOID SURGERY     PARTIAL HYSTERECTOMY     THYROIDECTOMY N/A 08/25/2019   Procedure: THYROIDECTOMY EXTRACTION OF SUBTOTAL COMPONENT; PARATHYROID  AUTOTRANSPLANT X1;  Surgeon: Mercy Stall, MD;  Location: [facility] ORS;  Service: General;  Laterality: N/A;  With Nerve Monitoring(RLN)   TONSILLECTOMY      Physical Exam   Triage Vital Signs: ED Triage Vitals  Encounter Vitals Group     BP 03/05/24 1712 (!) 136/44     Systolic BP Percentile --      Diastolic BP Percentile --      Pulse Rate 03/05/24 1712 63     Resp 03/05/24 1712 19     Temp 03/05/24 1712 98.3 F (36.8 C)     Temp src --      SpO2 03/05/24 1712 96 %     Weight 03/05/24 1707 162 lb 0.6 oz (73.5 kg)     Height 03/05/24 1707 5\' 2"  (1.575 m)     Head Circumference --      Peak Flow --      Pain Score 03/05/24 1707 0     Pain Loc --      Pain Education --      Exclude from Growth Chart --     Most recent vital signs: Vitals:   03/05/24 2100 03/05/24 2130  BP: (!) 151/52 (!) 152/47  Pulse: 77 78  Resp: 15 (!) 22  Temp:    SpO2: 91% 95%    General: Awake, no distress.  CV:  Good peripheral perfusion.  Regular rate, normal distal pulses Resp:  Normal effort.  Initial air entry and bronchial breath sounds on the left.  No frank wheezing.  Mildly prolonged expiratory phase Abd:  No distention.  Soft nontender Other:  No lower extremity edema   ED Results / Procedures / Treatments   Labs (all labs ordered are listed, but only abnormal results are displayed) Labs Reviewed  BASIC METABOLIC PANEL WITH GFR - Abnormal; Notable for the following components:      Result Value   Potassium 2.9 (*)    Chloride 94 (*)    Glucose, Bld 173 (*)    Creatinine, Ser 1.33 (*)    Calcium  8.4 (*)    GFR, Estimated 40 (*)     All other components within normal limits  CBC - Abnormal; Notable for the following components:   RBC 2.69 (*)    Hemoglobin 8.0 (*)    HCT 24.8 (*)    RDW 15.9 (*)    Platelets 136 (*)    All other components within normal limits  TROPONIN I (HIGH SENSITIVITY) - Abnormal; Notable for the following components:   Troponin I (High Sensitivity) 26 (*)  All other components within normal limits  TROPONIN I (HIGH SENSITIVITY) - Abnormal; Notable for the following components:   Troponin I (High Sensitivity) 28 (*)    All other components within normal limits  RESP PANEL BY RT-PCR (RSV, FLU A&B, COVID)  [id number]     EKG Interpreted by me Sinus rhythm rate of 62.  Normal axis, first-degree AV block.  Poor R wave progression.  No acute ischemic changes.   RADIOLOGY Chest x-ray interpreted by me, unremarkable.  Radiology report reviewed   PROCEDURES:  Procedures   MEDICATIONS ORDERED IN ED: Medications  methylPREDNISolone  sodium succinate (SOLU-MEDROL ) 125 mg/2 mL injection 125 mg (125 mg Intravenous Given 03/05/24 1800)  ipratropium-albuterol  (DUONEB) 0.5-2.5 (3) MG/3ML nebulizer solution 3 mL (3 mLs Nebulization Given 03/05/24 1800)  albuterol  (PROVENTIL ) (2.5 MG/3ML) 0.083% nebulizer solution 5 mg (5 mg Nebulization Given 03/05/24 1800)  doxycycline  (VIBRA -TABS) tablet 100 mg (100 mg Oral Given 03/05/24 2158)  potassium chloride  SA (KLOR-CON  M) CR tablet 40 mEq (40 mEq Oral Given 03/05/24 2158)     IMPRESSION / MDM / ASSESSMENT AND PLAN / ED COURSE  I reviewed the triage vital signs and the nursing notes.  DDx: COPD exacerbation, pleural effusion, pulmonary edema, pneumonia, pneumothorax, COVID, influenza, electrolyte derangement, non-STEMI  Patient's presentation is most consistent with acute presentation with potential threat to life or bodily function.  Patient presents with shortness of breath with unremarkable vital signs.  Not in respiratory distress.  Suspect primary  pulmonary disease, likely COPD.  Will give Solu-Medrol  bronchodilators and reassess while obtaining labs and chest x-ray.   ----------------------------------------- 10:00 PM on 03/05/2024 ----------------------------------------- Feeling better.  Repeat lung auscultation demonstrates improved air movement, no wheezing.  Considered admission, but patient feels much better and wants to go home which I think is reasonable.  Will start on doxycycline , prednisone  course, follow-up with her pulmonologist.      FINAL CLINICAL IMPRESSION(S) / ED DIAGNOSES   Final diagnoses:  COPD exacerbation (HCC)     Rx / DC Orders   ED Discharge Orders          Ordered    predniSONE  (DELTASONE ) 20 MG tablet  Daily with breakfast        03/05/24 2157    doxycycline  (VIBRAMYCIN ) 100 MG capsule  2 times daily        03/05/24 2157    guaiFENesin  (MUCINEX ) 600 MG 12 hr tablet  2 times daily        03/05/24 2157             Note:  This document was prepared using Dragon voice recognition software and may include unintentional dictation errors.   Jacquie Maudlin, MD 03/05/24 2203

## 2024-03-05 NOTE — ED Notes (Signed)
 ED Provider at bedside.

## 2024-03-06 ENCOUNTER — Ambulatory Visit: Admitting: Pulmonary Disease

## 2024-03-09 ENCOUNTER — Emergency Department

## 2024-03-09 ENCOUNTER — Inpatient Hospital Stay
Admission: EM | Admit: 2024-03-09 | Discharge: 2024-03-12 | DRG: 291 | Disposition: A | Discharge status: Home / Self Care | Attending: Internal Medicine | Admitting: Internal Medicine

## 2024-03-09 DIAGNOSIS — L570 Actinic keratosis: Secondary | ICD-10-CM | POA: Diagnosis present

## 2024-03-09 DIAGNOSIS — I081 Rheumatic disorders of both mitral and tricuspid valves: Secondary | ICD-10-CM | POA: Diagnosis present

## 2024-03-09 DIAGNOSIS — Z1152 Encounter for screening for COVID-19: Secondary | ICD-10-CM

## 2024-03-09 DIAGNOSIS — K7581 Nonalcoholic steatohepatitis (NASH): Secondary | ICD-10-CM | POA: Diagnosis present

## 2024-03-09 DIAGNOSIS — D631 Anemia in chronic kidney disease: Secondary | ICD-10-CM | POA: Diagnosis present

## 2024-03-09 DIAGNOSIS — Z8701 Personal history of pneumonia (recurrent): Secondary | ICD-10-CM

## 2024-03-09 DIAGNOSIS — I132 Hypertensive heart and chronic kidney disease with heart failure and with stage 5 chronic kidney disease, or end stage renal disease: Secondary | ICD-10-CM | POA: Diagnosis not present

## 2024-03-09 DIAGNOSIS — Z841 Family history of disorders of kidney and ureter: Secondary | ICD-10-CM

## 2024-03-09 DIAGNOSIS — M1990 Unspecified osteoarthritis, unspecified site: Secondary | ICD-10-CM | POA: Diagnosis present

## 2024-03-09 DIAGNOSIS — M810 Age-related osteoporosis without current pathological fracture: Secondary | ICD-10-CM | POA: Diagnosis present

## 2024-03-09 DIAGNOSIS — E785 Hyperlipidemia, unspecified: Secondary | ICD-10-CM | POA: Diagnosis present

## 2024-03-09 DIAGNOSIS — E1169 Type 2 diabetes mellitus with other specified complication: Secondary | ICD-10-CM | POA: Diagnosis present

## 2024-03-09 DIAGNOSIS — Z90711 Acquired absence of uterus with remaining cervical stump: Secondary | ICD-10-CM

## 2024-03-09 DIAGNOSIS — Z91012 Allergy to eggs: Secondary | ICD-10-CM

## 2024-03-09 DIAGNOSIS — E1122 Type 2 diabetes mellitus with diabetic chronic kidney disease: Secondary | ICD-10-CM | POA: Diagnosis present

## 2024-03-09 DIAGNOSIS — Z992 Dependence on renal dialysis: Secondary | ICD-10-CM

## 2024-03-09 DIAGNOSIS — Z882 Allergy status to sulfonamides status: Secondary | ICD-10-CM

## 2024-03-09 DIAGNOSIS — Z888 Allergy status to other drugs, medicaments and biological substances status: Secondary | ICD-10-CM

## 2024-03-09 DIAGNOSIS — R5381 Other malaise: Secondary | ICD-10-CM | POA: Diagnosis present

## 2024-03-09 DIAGNOSIS — I480 Paroxysmal atrial fibrillation: Secondary | ICD-10-CM | POA: Diagnosis present

## 2024-03-09 DIAGNOSIS — R0600 Dyspnea, unspecified: Secondary | ICD-10-CM | POA: Diagnosis present

## 2024-03-09 DIAGNOSIS — K219 Gastro-esophageal reflux disease without esophagitis: Secondary | ICD-10-CM | POA: Diagnosis present

## 2024-03-09 DIAGNOSIS — R0602 Shortness of breath: Principal | ICD-10-CM

## 2024-03-09 DIAGNOSIS — J45901 Unspecified asthma with (acute) exacerbation: Secondary | ICD-10-CM | POA: Diagnosis present

## 2024-03-09 DIAGNOSIS — E669 Obesity, unspecified: Secondary | ICD-10-CM | POA: Diagnosis present

## 2024-03-09 DIAGNOSIS — J441 Chronic obstructive pulmonary disease with (acute) exacerbation: Secondary | ICD-10-CM | POA: Diagnosis present

## 2024-03-09 DIAGNOSIS — Z79899 Other long term (current) drug therapy: Secondary | ICD-10-CM

## 2024-03-09 DIAGNOSIS — I5033 Acute on chronic diastolic (congestive) heart failure: Secondary | ICD-10-CM | POA: Diagnosis present

## 2024-03-09 DIAGNOSIS — J9811 Atelectasis: Secondary | ICD-10-CM | POA: Diagnosis present

## 2024-03-09 DIAGNOSIS — Z79890 Hormone replacement therapy (postmenopausal): Secondary | ICD-10-CM

## 2024-03-09 DIAGNOSIS — J9601 Acute respiratory failure with hypoxia: Secondary | ICD-10-CM | POA: Diagnosis present

## 2024-03-09 DIAGNOSIS — N186 End stage renal disease: Secondary | ICD-10-CM | POA: Diagnosis present

## 2024-03-09 DIAGNOSIS — I272 Pulmonary hypertension, unspecified: Secondary | ICD-10-CM | POA: Diagnosis present

## 2024-03-09 DIAGNOSIS — E890 Postprocedural hypothyroidism: Secondary | ICD-10-CM | POA: Diagnosis present

## 2024-03-09 DIAGNOSIS — Z87442 Personal history of urinary calculi: Secondary | ICD-10-CM

## 2024-03-09 DIAGNOSIS — Z66 Do not resuscitate: Secondary | ICD-10-CM | POA: Diagnosis present

## 2024-03-09 DIAGNOSIS — G4733 Obstructive sleep apnea (adult) (pediatric): Secondary | ICD-10-CM | POA: Diagnosis present

## 2024-03-09 DIAGNOSIS — E039 Hypothyroidism, unspecified: Secondary | ICD-10-CM | POA: Diagnosis present

## 2024-03-09 DIAGNOSIS — I10 Essential (primary) hypertension: Secondary | ICD-10-CM | POA: Diagnosis present

## 2024-03-09 DIAGNOSIS — Z9049 Acquired absence of other specified parts of digestive tract: Secondary | ICD-10-CM

## 2024-03-09 DIAGNOSIS — Z7951 Long term (current) use of inhaled steroids: Secondary | ICD-10-CM

## 2024-03-09 DIAGNOSIS — Z8249 Family history of ischemic heart disease and other diseases of the circulatory system: Secondary | ICD-10-CM

## 2024-03-09 DIAGNOSIS — Z7901 Long term (current) use of anticoagulants: Secondary | ICD-10-CM

## 2024-03-09 DIAGNOSIS — M109 Gout, unspecified: Secondary | ICD-10-CM | POA: Diagnosis present

## 2024-03-09 DIAGNOSIS — Z85828 Personal history of other malignant neoplasm of skin: Secondary | ICD-10-CM

## 2024-03-09 LAB — TROPONIN I (HIGH SENSITIVITY)
Troponin I (High Sensitivity): 37 ng/L — ABNORMAL HIGH (ref <18)
Troponin I (High Sensitivity): 43 ng/L — ABNORMAL HIGH (ref <18)

## 2024-03-09 LAB — RESP PANEL BY RT-PCR (RSV, FLU A&B, COVID)  RVPGX2
Influenza A by PCR: NEGATIVE
Influenza B by PCR: NEGATIVE
Resp Syncytial Virus by PCR: NEGATIVE
SARS Coronavirus 2 by RT PCR: NEGATIVE

## 2024-03-09 LAB — BASIC METABOLIC PANEL WITH GFR
Anion gap: 13 (ref 5–15)
BUN: 75 mg/dL — ABNORMAL HIGH (ref 8–23)
CO2: 23 mmol/L (ref 22–32)
Calcium: 9 mg/dL (ref 8.9–10.3)
Chloride: 99 mmol/L (ref 98–111)
Creatinine, Ser: 3.21 mg/dL — ABNORMAL HIGH (ref 0.44–1.00)
GFR, Estimated: 14 mL/min — ABNORMAL LOW (ref >60)
Glucose, Bld: 133 mg/dL — ABNORMAL HIGH (ref 70–99)
Potassium: 3.8 mmol/L (ref 3.5–5.1)
Sodium: 135 mmol/L (ref 135–145)

## 2024-03-09 LAB — CBC
HCT: 26.1 % — ABNORMAL LOW (ref 36.0–46.0)
Hemoglobin: 8.5 g/dL — ABNORMAL LOW (ref 12.0–15.0)
MCH: 30 pg (ref 26.0–34.0)
MCHC: 32.6 g/dL (ref 30.0–36.0)
MCV: 92.2 fL (ref 80.0–100.0)
Platelets: 193 10*3/uL (ref 150–400)
RBC: 2.83 MIL/uL — ABNORMAL LOW (ref 3.87–5.11)
RDW: 16.5 % — ABNORMAL HIGH (ref 11.5–15.5)
WBC: 8.6 10*3/uL (ref 4.0–10.5)
nRBC: 0 % (ref 0.0–0.2)

## 2024-03-09 LAB — BLOOD GAS, VENOUS
Acid-Base Excess: 6.7 mmol/L — ABNORMAL HIGH (ref 0.0–2.0)
Bicarbonate: 30.4 mmol/L — ABNORMAL HIGH (ref 20.0–28.0)
O2 Saturation: 96.8 %
Patient temperature: 37
pCO2, Ven: 39 mmHg — ABNORMAL LOW (ref 44–60)
pH, Ven: 7.5 — ABNORMAL HIGH (ref 7.25–7.43)
pO2, Ven: 77 mmHg — ABNORMAL HIGH (ref 32–45)

## 2024-03-09 LAB — PROCALCITONIN: Procalcitonin: 0.1 ng/mL

## 2024-03-09 LAB — BRAIN NATRIURETIC PEPTIDE: B Natriuretic Peptide: 2527.2 pg/mL — ABNORMAL HIGH (ref 0.0–100.0)

## 2024-03-09 MED ORDER — ACETAMINOPHEN 650 MG RE SUPP: 650.0000 mg | Freq: Four times a day (QID) | RECTAL | Status: DC | PRN

## 2024-03-09 MED ORDER — METHYLPREDNISOLONE SODIUM SUCC 125 MG IJ SOLR
125.0000 mg | Freq: Once | INTRAMUSCULAR | Status: AC
  Administered 2024-03-09: 125 mg via INTRAVENOUS
  Filled 2024-03-09: qty 2

## 2024-03-09 MED ORDER — ONDANSETRON HCL 4 MG/2ML IJ SOLN: 4.0000 mg | Freq: Four times a day (QID) | INTRAMUSCULAR | Status: DC | PRN

## 2024-03-09 MED ORDER — IPRATROPIUM-ALBUTEROL 0.5-2.5 (3) MG/3ML IN SOLN
3.0000 mL | Freq: Once | RESPIRATORY_TRACT | Status: AC
  Administered 2024-03-09: 3 mL via RESPIRATORY_TRACT
  Filled 2024-03-09: qty 3

## 2024-03-09 MED ORDER — SERTRALINE HCL 50 MG PO TABS
100.0000 mg | ORAL_TABLET | Freq: Every day | ORAL | Status: DC
  Administered 2024-03-10 – 2024-03-12 (×3): 100 mg via ORAL
  Filled 2024-03-09 (×3): qty 2

## 2024-03-09 MED ORDER — APIXABAN 2.5 MG PO TABS
2.5000 mg | ORAL_TABLET | Freq: Two times a day (BID) | ORAL | Status: DC
  Administered 2024-03-09 – 2024-03-12 (×6): 2.5 mg via ORAL
  Filled 2024-03-09 (×7): qty 1

## 2024-03-09 MED ORDER — LEVOTHYROXINE SODIUM 75 MCG PO TABS
75.0000 ug | ORAL_TABLET | Freq: Every day | ORAL | Status: DC
  Administered 2024-03-10 – 2024-03-12 (×3): 75 ug via ORAL
  Filled 2024-03-09: qty 1
  Filled 2024-03-09: qty 3
  Filled 2024-03-09: qty 1
  Filled 2024-03-09: qty 3
  Filled 2024-03-09 (×2): qty 1

## 2024-03-09 MED ORDER — IPRATROPIUM-ALBUTEROL 0.5-2.5 (3) MG/3ML IN SOLN
3.0000 mL | Freq: Four times a day (QID) | RESPIRATORY_TRACT | Status: DC
Start: 2024-03-09 — End: 2024-03-11
  Administered 2024-03-10 – 2024-03-11 (×6): 3 mL via RESPIRATORY_TRACT
  Filled 2024-03-09 (×6): qty 3

## 2024-03-09 MED ORDER — ALBUTEROL SULFATE (2.5 MG/3ML) 0.083% IN NEBU
2.5000 mg | INHALATION_SOLUTION | RESPIRATORY_TRACT | Status: DC | PRN
  Filled 2024-03-09: qty 3

## 2024-03-09 MED ORDER — METOPROLOL TARTRATE 25 MG PO TABS
25.0000 mg | ORAL_TABLET | Freq: Two times a day (BID) | ORAL | Status: DC
  Administered 2024-03-09 – 2024-03-12 (×5): 25 mg via ORAL
  Filled 2024-03-09 (×5): qty 1

## 2024-03-09 MED ORDER — DOXYCYCLINE HYCLATE 100 MG PO TABS
100.0000 mg | ORAL_TABLET | Freq: Once | ORAL | Status: AC
  Administered 2024-03-09: 100 mg via ORAL
  Filled 2024-03-09: qty 1

## 2024-03-09 MED ORDER — FUROSEMIDE 10 MG/ML IJ SOLN
40.0000 mg | Freq: Two times a day (BID) | INTRAMUSCULAR | Status: DC
  Administered 2024-03-10 – 2024-03-12 (×5): 40 mg via INTRAVENOUS
  Filled 2024-03-09 (×5): qty 4

## 2024-03-09 MED ORDER — FUROSEMIDE 10 MG/ML IJ SOLN
40.0000 mg | Freq: Once | INTRAMUSCULAR | Status: AC
  Administered 2024-03-09: 40 mg via INTRAVENOUS
  Filled 2024-03-09: qty 4

## 2024-03-09 MED ORDER — PREDNISONE 20 MG PO TABS
40.0000 mg | ORAL_TABLET | Freq: Every day | ORAL | Status: DC
  Administered 2024-03-11 – 2024-03-12 (×2): 40 mg via ORAL
  Filled 2024-03-09 (×2): qty 2

## 2024-03-09 MED ORDER — HYDROCODONE-ACETAMINOPHEN 5-325 MG PO TABS: 1.0000 | ORAL_TABLET | ORAL | Status: DC | PRN

## 2024-03-09 MED ORDER — SODIUM CHLORIDE 0.9 % IV SOLN
1.0000 g | Freq: Once | INTRAVENOUS | Status: AC
  Administered 2024-03-09: 1 g via INTRAVENOUS
  Filled 2024-03-09: qty 10

## 2024-03-09 MED ORDER — METHYLPREDNISOLONE SODIUM SUCC 40 MG IJ SOLR
40.0000 mg | Freq: Two times a day (BID) | INTRAMUSCULAR | Status: AC
  Administered 2024-03-10 (×2): 40 mg via INTRAVENOUS
  Filled 2024-03-09 (×2): qty 1

## 2024-03-09 MED ORDER — ACETAMINOPHEN 325 MG PO TABS
650.0000 mg | ORAL_TABLET | Freq: Four times a day (QID) | ORAL | Status: DC | PRN
Start: 2024-03-09 — End: 2024-03-12

## 2024-03-09 MED ORDER — NITROGLYCERIN 2 % TD OINT
1.0000 [in_us] | TOPICAL_OINTMENT | Freq: Once | TRANSDERMAL | Status: AC
  Administered 2024-03-09: 1 [in_us] via TOPICAL
  Filled 2024-03-09: qty 1

## 2024-03-09 MED ORDER — ONDANSETRON HCL 4 MG PO TABS
4.0000 mg | ORAL_TABLET | Freq: Four times a day (QID) | ORAL | Status: DC | PRN
Start: 2024-03-09 — End: 2024-03-12

## 2024-03-09 MED ORDER — GUAIFENESIN ER 600 MG PO TB12
600.0000 mg | ORAL_TABLET | Freq: Two times a day (BID) | ORAL | Status: DC
  Administered 2024-03-09 – 2024-03-12 (×6): 600 mg via ORAL
  Filled 2024-03-09 (×6): qty 1

## 2024-03-09 NOTE — ED Notes (Signed)
 Vallarie Gauze MD at bedside, verbal order for 1in nitroglycerin paste and 40mg  lasix  IVP.

## 2024-03-09 NOTE — Assessment & Plan Note (Signed)
Nephrology consult for continuation of dialysis 

## 2024-03-09 NOTE — H&P (Signed)
 History and Physical    Patient: [name] ONG:[id number] DOB: August 25, 1941 DOA: 03/09/2024 DOS: the patient was seen and examined on 03/09/2024 PCP: Ostwalt, Janna, PA-C  Patient coming from: Home  Chief Complaint:  Chief Complaint  Patient presents with   Shortness of Breath    HPI: [name] is a 83 y.o. female with medical history significant for CKD 4, ESRD, initiated on dialysis a couple months ago, diastolic CHF, HTN, surgical hypothyroidism, gout, COPD, OSA on CPAP  paroxysmal atrial fibrillation on  Eliquis , being admitted with shortness of breath secondary to suspected fluid overload after presenting to the ED for the second time in 2 days.  On her first visit she was treated for COPD and prescribed a dose of steroids however she states she continues to have shortness of breath and feel poorly.  She has been compliant with her dialysis however states they have not been taking a lot of fluid off.  She denies chest pain, fever or chills. Past discharge summary  from 3/21 to 4/1 , admitted with acute respiratory failure of multifactorial etiology related to CAP/COPD/CHF exacerbation.  She required O2 up to 4 L but was weaned to room air.  She initiated dialysis during that hospitalization. In the ED BP 162/60 and tachypneic to 30 but maintaining O2 sats in the mid 90s on room air.  Labs notable for troponin 37 and BNP 2527 with normal WBC and negative respiratory viral panel.  VBG was unremarkable with venous pH of 7.5 and low pCO2 of 39.  Hemoglobin near baseline at 8.5, BNP in keeping with dialysis status with normal potassium and bicarb EKG showing sinus rhythm at 77 with no acute ST abnormalities and chest x-ray showed worsening right greater than left bibasilar atelectasis with a small left pleural effusion. Patient was treated with DuoNebs, methylprednisolone  and given a dose of IV Lasix  she was also empirically started on ceftriaxone  and doxycycline  for possible acute bronchitis.   Admission requested     Review of Systems: As mentioned in the history of present illness. All other systems reviewed and are negative.  Past Medical History:  Diagnosis Date   Actinic keratosis    Albuminuria    Anemia    Arthritis    Asthma    Basal cell carcinoma 04/12/2017   Above right lateral brow. Nodulocystic type. EDC   Cancer (HCC)    skin   Cataract cortical, senile    CHF (congestive heart failure) (HCC)    Diabetes mellitus without complication (HCC)    GERD (gastroesophageal reflux disease)    Hemorrhoids    History of kidney stones    Hyperlipidemia    Hypertension    Hypothyroidism    Lyme disease    No kidney function    OSA (obstructive sleep apnea)    Osteoporosis    Osteoporosis    Reflux esophagitis    Steatohepatitis    Steatohepatitis    Past Surgical History:  Procedure Laterality Date   A/V FISTULAGRAM N/A 12/31/2023   Procedure: A/V Fistulagram;  Surgeon: Celso College, MD;  Location: [facility] INVASIVE CV LAB;  Service: Cardiovascular;  Laterality: N/A;   ABDOMINAL HYSTERECTOMY     APPENDECTOMY     AV FISTULA PLACEMENT Left 05/19/2022   Procedure: ARTERIOVENOUS (AV) FISTULA CREATION ( BRACHIAL CEPHALIC );  Surgeon: Jackquelyn Mass, MD;  Location: [facility] ORS;  Service: Vascular;  Laterality: Left;   CARDIAC CATHETERIZATION  1980   [facility]   CARDIAC CATHETERIZATION  08/13/2014   [facility]. no significant CAD, normal LVEDP.    CATARACT EXTRACTION     CHOLECYSTECTOMY     COLONOSCOPY     COLONOSCOPY WITH PROPOFOL  N/A 12/07/2016   Procedure: COLONOSCOPY WITH PROPOFOL ;  Surgeon: Deveron Fly, MD;  Location: [facility] ENDOSCOPY;  Service: Endoscopy;  Laterality: N/A;   DIALYSIS/PERMA CATHETER INSERTION N/A 12/31/2023   Procedure: DIALYSIS/PERMA CATHETER INSERTION;  Surgeon: Celso College, MD;  Location: [facility] INVASIVE CV LAB;  Service: Cardiovascular;  Laterality: N/A;   ESOPHAGOGASTRODUODENOSCOPY     ESOPHAGOGASTRODUODENOSCOPY (EGD) WITH PROPOFOL  N/A 12/07/2016    Procedure: ESOPHAGOGASTRODUODENOSCOPY (EGD) WITH PROPOFOL ;  Surgeon: Deveron Fly, MD;  Location: [facility] ENDOSCOPY;  Service: Endoscopy;  Laterality: N/A;   ESOPHAGOGASTRODUODENOSCOPY (EGD) WITH PROPOFOL  N/A 01/07/2018   Procedure: ESOPHAGOGASTRODUODENOSCOPY (EGD) WITH PROPOFOL ;  Surgeon: Deveron Fly, MD;  Location: [facility] ENDOSCOPY;  Service: Endoscopy;  Laterality: N/A;   EYE SURGERY     HEMATOMA EVACUATION Right 08/02/2023   Procedure: EVACUATION HEMATOMA;  Surgeon: Alben Alma, MD;  Location: [facility] ORS;  Service: General;  Laterality: Right;   HEMORRHOID SURGERY     PARTIAL HYSTERECTOMY     THYROIDECTOMY N/A 08/25/2019   Procedure: THYROIDECTOMY EXTRACTION OF SUBTOTAL COMPONENT; PARATHYROID  AUTOTRANSPLANT X1;  Surgeon: Mercy Stall, MD;  Location: [facility] ORS;  Service: General;  Laterality: N/A;  With Nerve Monitoring(RLN)   TONSILLECTOMY     Social History:  reports that she has never smoked. She has never used smokeless tobacco. She reports that she does not drink alcohol and does not use drugs.  Allergies  Allergen Reactions   Ace Inhibitors     Other reaction(s): Unknown   Egg-Derived Products Diarrhea   Other     Other reaction(s): Other (See Comments) Eggs   Prednisone      Other reaction(s): Other (See Comments) joint pain   Risedronate     Other reaction(s): Other (See Comments)   Sulfa Antibiotics Itching and Swelling    Other reaction(s): Other (See Comments)   Sulfasalazine Other (See Comments)    Family History  Problem Relation Age of Onset   Cancer Mother        breast   Kidney disease Mother    Breast cancer Mother    Hypertension Father    Heart attack Father     Prior to Admission medications   Medication Sig Start Date End Date Taking? Authorizing Provider  acetaminophen  (TYLENOL ) 500 MG tablet Take 1,000 mg by mouth every 6 (six) hours as needed for mild pain or moderate pain.    [provider]  albuterol  (VENTOLIN  HFA) 108 (90  Base) MCG/ACT inhaler Inhale 2 puffs into the lungs every 6 (six) hours as needed for wheezing or shortness of breath. 11/14/23   Kasa, Kurian, MD  allopurinol  (ZYLOPRIM ) 100 MG tablet Take 100 mg by mouth daily. 09/17/23   [provider]  amLODipine  (NORVASC ) 5 MG tablet Take 1 tablet (5 mg total) by mouth daily. 08/06/23 08/05/24  Alexander, Natalie, DO  apixaban  (ELIQUIS ) 2.5 MG TABS tablet Take 1 tablet (2.5 mg total) by mouth 2 (two) times daily. 06/04/23 06/03/24  Althia Atlas, MD  azelastine  (ASTELIN ) 0.1 % nasal spray Place 2 sprays into both nostrils 2 (two) times daily. Use in each nostril as directed 10/02/23 01/17/24  Evelina Hippo, MD  doxycycline  (VIBRAMYCIN ) 100 MG capsule Take 1 capsule (100 mg total) by mouth 2 (two) times daily for 10 days. 03/05/24 03/15/24  Jacquie Maudlin, MD  epoetin   alfa-epbx (RETACRIT ) 4000 UNIT/ML injection Inject 1 mL (4,000 Units total) into the vein every Monday, Wednesday, and Friday at 6 PM. 01/02/24   Garrison Kanner, MD  esomeprazole  (NEXIUM ) 40 MG capsule Take 1 capsule (40 mg total) by mouth daily before breakfast. 11/27/23   Agrawal, Kavita, MD  ferrous sulfate 325 (65 FE) MG tablet Take 325 mg by mouth daily with breakfast.    [provider]  fluticasone  (FLONASE ) 50 MCG/ACT nasal spray Place 2 sprays into both nostrils daily. 10/02/23 01/17/24  Evelina Hippo, MD  fluticasone  furoate-vilanterol (BREO ELLIPTA ) 200-25 MCG/ACT AEPB Inhale 1 puff into the lungs daily. 11/14/23   Kasa, Kurian, MD  folic acid  (FOLVITE ) 1 MG tablet Take 1 tablet (1 mg total) by mouth daily. 01/01/24   Garrison Kanner, MD  furosemide  (LASIX ) 40 MG tablet TAKE ONE (1) TABLET BY MOUTH TWO TIMES PER DAY 03/04/24   Wenona Hamilton, MD  guaiFENesin  (MUCINEX ) 600 MG 12 hr tablet Take 1 tablet (600 mg total) by mouth 2 (two) times daily for 15 days. 03/05/24 03/20/24  Jacquie Maudlin, MD  hydrALAZINE  (APRESOLINE ) 100 MG tablet Take 1 tablet (100 mg total) by mouth 3 (three) times  daily. 04/22/23   Wouk, Haynes Lips, MD  ipratropium (ATROVENT ) 0.06 % nasal spray Place 2 sprays into both nostrils 4 (four) times daily. 10/27/23   Kent Pear, NP  ipratropium-albuterol  (DUONEB) 0.5-2.5 (3) MG/3ML SOLN Take 3 mLs by nebulization every 6 (six) hours as needed (SOB). 11/14/23   Kasa, Kurian, MD  levothyroxine  (SYNTHROID ) 75 MCG tablet Take 75 mcg by mouth daily.    [provider]  lidocaine -prilocaine  (EMLA ) cream as directed. Before dialysis 01/02/24   [provider]  loperamide  (IMODIUM ) 2 MG capsule Take 1 capsule (2 mg total) by mouth as needed for diarrhea or loose stools. 08/05/22   Amin, Sumayya, MD  metoprolol  tartrate (LOPRESSOR ) 50 MG tablet Take 25 mg by mouth 2 (two) times daily.    [provider]  metoprolol  tartrate (LOPRESSOR ) 50 MG tablet 25 tablet 2 TIMES DAILY (route: oral) 01/01/24   [provider]  montelukast  (SINGULAIR ) 10 MG tablet Take 1 tablet (10 mg total) by mouth at bedtime. 01/01/24   Garrison Kanner, MD  Multiple Minerals-Vitamins (CALCIUM  & VIT D3 BONE HEALTH PO) Take 1 tablet by mouth daily. 600 mg/ 25 mg    [provider]  predniSONE  (DELTASONE ) 20 MG tablet Take 2 tablets (40 mg total) by mouth daily with breakfast for 4 days. 03/05/24 03/09/24  Jacquie Maudlin, MD  rosuvastatin  (CRESTOR ) 40 MG tablet Take 40 mg by mouth daily.    [provider]  senna-docusate (SENOKOT-S) 8.6-50 MG tablet Take 2 tablets by mouth at bedtime as needed for mild constipation. 08/06/23   Alexander, Natalie, DO  sertraline  (ZOLOFT ) 100 MG tablet TAKE 1 TABLET BY MOUTH DAILY 02/15/24   Ostwalt, Janna, PA-C  vitamin B-12 (CYANOCOBALAMIN ) 500 MCG tablet Take 500 mcg by mouth daily.    [provider]    Physical Exam: Vitals:   03/09/24 1842 03/09/24 1852 03/09/24 1858 03/09/24 1900  BP: (!) 162/60 (!) 182/79  (!) 163/58  Pulse: 79 82  75  Resp: (!) 30   (!) 36  Temp: 98.4 F (36.9 C)     SpO2: 95% 94% 95% 95%    Physical Exam Vitals and nursing note reviewed.  Constitutional:      General: She is not in acute distress.    Comments: Patient  sitting upright at edge of stretcher, conversational dyspnea  HENT:     Head: Normocephalic and atraumatic.  Cardiovascular:     Rate and Rhythm: Normal rate and regular rhythm.     Heart sounds: Normal heart sounds.  Pulmonary:     Effort: Tachypnea and prolonged expiration present.     Breath sounds: Wheezing present.  Abdominal:     Palpations: Abdomen is soft.     Tenderness: There is no abdominal tenderness.  Musculoskeletal:     Right lower leg: No edema.     Left lower leg: No edema.  Neurological:     Mental Status: Mental status is at baseline.     Labs on Admission: I have personally reviewed following labs and imaging studies  CBC: Recent Labs  Lab 03/05/24 1747 03/09/24 1857  WBC 5.8 8.6  HGB 8.0* 8.5*  HCT 24.8* 26.1*  MCV 92.2 92.2  PLT 136* 193   Basic Metabolic Panel: Recent Labs  Lab 03/05/24 1747 03/09/24 1857  NA 137 135  K 2.9* 3.8  CL 94* 99  CO2 29 23  GLUCOSE 173* 133*  BUN 14 75*  CREATININE 1.33* 3.21*  CALCIUM  8.4* 9.0   GFR: Estimated Creatinine Clearance: 12.5 mL/min (A) (by C-G formula based on SCr of 3.21 mg/dL (H)). Liver Function Tests: No results for input(s): "AST", "ALT", "ALKPHOS", "BILITOT", "PROT", "ALBUMIN" in the last 168 hours. No results for input(s): "LIPASE", "AMYLASE" in the last 168 hours. No results for input(s): "AMMONIA" in the last 168 hours. Coagulation Profile: No results for input(s): "INR", "PROTIME" in the last 168 hours. Cardiac Enzymes: No results for input(s): "CKTOTAL", "CKMB", "CKMBINDEX", "TROPONINI" in the last 168 hours. BNP (last 3 results) No results for input(s): "PROBNP" in the last 8760 hours. HbA1C: No results for input(s): "HGBA1C" in the last 72 hours. CBG: No results for input(s): "GLUCAP" in the last 168 hours. Lipid Profile: No results for  input(s): "CHOL", "HDL", "LDLCALC", "TRIG", "CHOLHDL", "LDLDIRECT" in the last 72 hours. Thyroid  Function Tests: No results for input(s): "TSH", "T4TOTAL", "FREET4", "T3FREE", "THYROIDAB" in the last 72 hours. Anemia Panel: No results for input(s): "VITAMINB12", "FOLATE", "FERRITIN", "TIBC", "IRON", "RETICCTPCT" in the last 72 hours. Urine analysis:    Component Value Date/Time   COLORURINE YELLOW (A) 09/24/2023 1050   APPEARANCEUR CLOUDY (A) 09/24/2023 1050   APPEARANCEUR Clear 09/17/2018 1028   LABSPEC 1.012 09/24/2023 1050   LABSPEC 1.013 06/20/2012 1508   PHURINE 5.0 09/24/2023 1050   GLUCOSEU NEGATIVE 09/24/2023 1050   GLUCOSEU Negative 06/20/2012 1508   HGBUR SMALL (A) 09/24/2023 1050   BILIRUBINUR NEGATIVE 09/24/2023 1050   BILIRUBINUR Negative 09/17/2018 1028   BILIRUBINUR Negative 06/20/2012 1508   KETONESUR NEGATIVE 09/24/2023 1050   PROTEINUR 100 (A) 09/24/2023 1050   NITRITE NEGATIVE 09/24/2023 1050   LEUKOCYTESUR LARGE (A) 09/24/2023 1050   LEUKOCYTESUR Negative 06/20/2012 1508    Radiological Exams on Admission: DG Chest Port 1 View Result Date: 03/09/2024 CLINICAL DATA:  Shortness of breath EXAM: PORTABLE CHEST 1 VIEW COMPARISON:  03/05/2024 FINDINGS: Unchanged position of dual lumen catheter with tip in the SVC. The tips are separated with 1 oriented slightly superiorly. Unchanged cardiomegaly. Worsening right greater than left bibasilar atelectasis. Small left pleural effusion. IMPRESSION: 1. Worsening right greater than left bibasilar atelectasis. 2. Small left pleural effusion. Electronically Signed   By: Juanetta Nordmann M.D.   On: 03/09/2024 19:42   Data Reviewed for HPI: Relevant notes from primary care and specialist visits, past  discharge summaries as available in EHR, including Care Everywhere. Prior diagnostic testing as pertinent to current admission diagnoses Updated medications and problem lists for reconciliation ED course, including vitals, labs, imaging,  treatment and response to treatment Triage notes, nursing and pharmacy notes and ED provider's notes Notable results as noted above in HPI      Assessment and Plan: * Acute dyspnea Multifactorial:?  Acute bronchitis, CHF, small pleural effusion, atelectasis, recurrent pneumonia/symptomatic anemia Not hypoxic Will get procalcitonin to see if antibiotics are necessary--> less than 0.10 Will treat for COPD and CHF exacerbation/fluid overload Expecting some improvement with dialysis Please see management of the each individual problem  COPD with acute exacerbation (HCC) History of CAP 12/21/2023 Schedule and as needed nebulized bronchodilators IV steroids Flutter valve, incentive spirometer Received Rocephin  and doxycycline  in the ED Will hold off on further antibiotics pending procalcitonin  Acute on chronic diastolic congestive heart failure (HCC) Small pleural effusion Expecting improvement with dialysis Nephrology consulted for dialysis IV Lasix  Continue home metoprolol , hydralazine   Paroxysmal atrial fibrillation (HCC) Continue Eliquis   ESRD on dialysis [facility]) Nephrology consult for continuation of dialysis  OSA (obstructive sleep apnea) Continue CPAP  Essential hypertension Will hold amlodipine  Continue metoprolol   Anemia in chronic kidney disease Hemoglobin at baseline    DVT prophylaxis: eliquis   Consults: renal  Advance Care Planning:   Code Status: Prior   Family Communication: none  Disposition Plan: Back to previous home environment  Severity of Illness: The appropriate patient status for this patient is OBSERVATION. Observation status is judged to be reasonable and necessary in order to provide the required intensity of service to ensure the patient's safety. The patient's presenting symptoms, physical exam findings, and initial radiographic and laboratory data in the context of their medical condition is felt to place them at decreased risk for  further clinical deterioration. Furthermore, it is anticipated that the patient will be medically stable for discharge from the hospital within 2 midnights of admission.   Author: Lanetta Pion, MD 03/09/2024 9:04 PM  For on call review [email or web address].

## 2024-03-09 NOTE — ED Notes (Signed)
Duncan MD at bedside 

## 2024-03-09 NOTE — Assessment & Plan Note (Signed)
 Will hold amlodipine  Continue metoprolol 

## 2024-03-09 NOTE — Assessment & Plan Note (Signed)
 Continue Eliquis .

## 2024-03-09 NOTE — ED Provider Notes (Signed)
 [facility] Provider Note    Event Date/Time   First MD Initiated Contact with Patient 03/09/24 1853     (approximate)   History   Shortness of Breath   HPI  [name] is a 83 y.o. female with 3 of COPD who comes in with concerns for shortness of breath.  Patient has had worsening shortness of breath worse with exertion.  She also has some chest pain occasionally.  She does report to dialysis on Monday Wednesday Friday with her last dialysis treatment on Friday.  I reviewed a note where patient was seen on 6/4 and treated with steroids, DuoNebs, doxy.  Patient reports that she went home and she has been taking these medications but she has not been feeling better.  She continues to feel short of breath she tried taking her breathing treatments without much improvement.  She reports that at dialysis they do not take any fluid off.  She denies any chest pain at this time.  She does report sometimes she has a little bit of shoulder pain but this is an intermittent issue and denies really any at this time.  She is on Eliquis  and reports being compliant with it.    Physical Exam   Triage Vital Signs: ED Triage Vitals  Encounter Vitals Group     BP 03/09/24 1842 (!) 162/60     Systolic BP Percentile --      Diastolic BP Percentile --      Pulse Rate 03/09/24 1842 79     Resp 03/09/24 1842 (!) 30     Temp 03/09/24 1842 98.4 F (36.9 C)     Temp src --      SpO2 03/09/24 1842 95 %     Weight --      Height --      Head Circumference --      Peak Flow --      Pain Score 03/09/24 1841 0     Pain Loc --      Pain Education --      Exclude from Growth Chart --     Most recent vital signs: Vitals:   03/09/24 1842  BP: (!) 162/60  Pulse: 79  Resp: (!) 30  Temp: 98.4 F (36.9 C)  SpO2: 95%     General: Awake, no distress.  CV:  Good peripheral perfusion.  Resp:  Increased work of breathing clear lungs Abd:  No distention.  Soft and  nontender Other:  Dialysis catheter noted   ED Results / Procedures / Treatments   Labs (all labs ordered are listed, but only abnormal results are displayed) Labs Reviewed  BASIC METABOLIC PANEL WITH GFR  CBC  TROPONIN I (HIGH SENSITIVITY)     EKG  My interpretation of EKG:  Sinus rate of 77 without any ST elevation or T wave inversions, normal intervals  RADIOLOGY I have reviewed the xray personally and interpreted atelectasis noted   PROCEDURES:  Critical Care performed: No  Procedures   MEDICATIONS ORDERED IN ED: Medications  metoprolol  tartrate (LOPRESSOR ) tablet 25 mg (25 mg Oral Given 03/09/24 2132)  sertraline  (ZOLOFT ) tablet 100 mg (has no administration in time range)  levothyroxine  (SYNTHROID ) tablet 75 mcg (has no administration in time range)  apixaban  (ELIQUIS ) tablet 2.5 mg (2.5 mg Oral Given 03/09/24 2144)  guaiFENesin  (MUCINEX ) 12 hr tablet 600 mg (600 mg Oral Given 03/09/24 2132)  acetaminophen  (TYLENOL ) tablet 650 mg (has no administration in time range)  Or  acetaminophen  (TYLENOL ) suppository 650 mg (has no administration in time range)  ondansetron  (ZOFRAN ) tablet 4 mg (has no administration in time range)    Or  ondansetron  (ZOFRAN ) injection 4 mg (has no administration in time range)  methylPREDNISolone  sodium succinate (SOLU-MEDROL ) 40 mg/mL injection 40 mg (has no administration in time range)    Followed by  predniSONE  (DELTASONE ) tablet 40 mg (has no administration in time range)  ipratropium-albuterol  (DUONEB) 0.5-2.5 (3) MG/3ML nebulizer solution 3 mL (3 mLs Nebulization Not Given 03/09/24 2128)  albuterol  (PROVENTIL ) (2.5 MG/3ML) 0.083% nebulizer solution 2.5 mg (has no administration in time range)  furosemide  (LASIX ) injection 40 mg (has no administration in time range)  HYDROcodone -acetaminophen  (NORCO/VICODIN) 5-325 MG per tablet 1-2 tablet (has no administration in time range)  ipratropium-albuterol  (DUONEB) 0.5-2.5 (3) MG/3ML  nebulizer solution 3 mL (3 mLs Nebulization Given 03/09/24 1916)  ipratropium-albuterol  (DUONEB) 0.5-2.5 (3) MG/3ML nebulizer solution 3 mL (3 mLs Nebulization Given 03/09/24 2053)  ipratropium-albuterol  (DUONEB) 0.5-2.5 (3) MG/3ML nebulizer solution 3 mL (3 mLs Nebulization Given 03/09/24 2053)  methylPREDNISolone  sodium succinate (SOLU-MEDROL ) 125 mg/2 mL injection 125 mg (125 mg Intravenous Given 03/09/24 2049)  furosemide  (LASIX ) injection 40 mg (40 mg Intravenous Given 03/09/24 2051)  cefTRIAXone  (ROCEPHIN ) 1 g in sodium chloride  0.9 % 100 mL IVPB (0 g Intravenous Stopped 03/09/24 2123)  doxycycline  (VIBRA -TABS) tablet 100 mg (100 mg Oral Given 03/09/24 2053)     IMPRESSION / MDM / ASSESSMENT AND PLAN / ED COURSE  I reviewed the triage vital signs and the nursing notes.   Patient's presentation is most consistent with acute presentation with potential threat to life or bodily function.   Patient comes in with concerns for shortness of breath she does have increased respiratory rate oxygen levels are about 90% will get blood work to evaluate for hypercapnia, x-ray did not like pneumonia, pneumothorax, pneumonia, cardiac markers to evaluate for ACS  BMP shows elevated creatinine but patient is on dialysis.  Her troponins are relatively flat going from 37-43 slight elevation could be from known dialysis that she really denied any chest pain to me.  Her VBG without any evidence of hypercapnia.  Her BNP is elevated she is on Lasix  will trial some IV Lasix  holding fluids secondary concern for fluid overload.  COVID, flu were negative hemoglobin stable white count is normal but will cover with antibiotics.  She does report some relief with the DuoNeb and does have a history of asthma so we will cover with steroids, antibiotics  The patient is on the cardiac monitor to evaluate for evidence of arrhythmia and/or significant heart rate changes.      FINAL CLINICAL IMPRESSION(S) / ED DIAGNOSES   Final  diagnoses:  SOB (shortness of breath)  Exacerbation of asthma, unspecified asthma severity, unspecified whether persistent  ESRD (end stage renal disease) on dialysis [facility])     Rx / DC Orders   ED Discharge Orders     None        Note:  This document was prepared using Dragon voice recognition software and may include unintentional dictation errors.   Lubertha Rush, MD 03/09/24 2151

## 2024-03-09 NOTE — Hospital Course (Signed)
 [name]

## 2024-03-09 NOTE — Assessment & Plan Note (Signed)
 History of CAP 12/21/2023 Schedule and as needed nebulized bronchodilators IV steroids Flutter valve, incentive spirometer Received Rocephin  and doxycycline  in the ED Will hold off on further antibiotics pending procalcitonin

## 2024-03-09 NOTE — ED Notes (Signed)
 Pt assisted onto bedside commode.

## 2024-03-09 NOTE — ED Triage Notes (Signed)
 Pt c/o SOB, worsening with exertion. Pt also states that upon exertion she begins experiencing CP which radiates to her back. Pt is MWF dialysis with last tx this Friday.

## 2024-03-09 NOTE — ED Notes (Signed)
 Pt assisted to bedside commode

## 2024-03-09 NOTE — Assessment & Plan Note (Signed)
 Hemoglobin at baseline

## 2024-03-09 NOTE — Assessment & Plan Note (Addendum)
 Multifactorial:?  Acute bronchitis, CHF, small pleural effusion, atelectasis, recurrent pneumonia/symptomatic anemia Not hypoxic Will get procalcitonin to see if antibiotics are necessary--> less than 0.10 Will treat for COPD and CHF exacerbation/fluid overload Expecting some improvement with dialysis Please see management of the each individual problem

## 2024-03-09 NOTE — ED Notes (Signed)
 Pt c/o of increased shortness of breath stating, "I cannot breath." This RN attempting to coach pt to take slow deep breaths. RT called and made aware.

## 2024-03-09 NOTE — Assessment & Plan Note (Signed)
 Continue CPAP.

## 2024-03-09 NOTE — Assessment & Plan Note (Signed)
 Small pleural effusion Expecting improvement with dialysis Nephrology consulted for dialysis IV Lasix  Continue home metoprolol , hydralazine 

## 2024-03-10 ENCOUNTER — Encounter: Payer: Self-pay | Admitting: Internal Medicine

## 2024-03-10 ENCOUNTER — Other Ambulatory Visit: Payer: Self-pay

## 2024-03-10 DIAGNOSIS — I132 Hypertensive heart and chronic kidney disease with heart failure and with stage 5 chronic kidney disease, or end stage renal disease: Secondary | ICD-10-CM | POA: Diagnosis present

## 2024-03-10 DIAGNOSIS — I081 Rheumatic disorders of both mitral and tricuspid valves: Secondary | ICD-10-CM | POA: Diagnosis present

## 2024-03-10 DIAGNOSIS — I5033 Acute on chronic diastolic (congestive) heart failure: Secondary | ICD-10-CM | POA: Diagnosis present

## 2024-03-10 DIAGNOSIS — E890 Postprocedural hypothyroidism: Secondary | ICD-10-CM | POA: Diagnosis present

## 2024-03-10 DIAGNOSIS — E1122 Type 2 diabetes mellitus with diabetic chronic kidney disease: Secondary | ICD-10-CM | POA: Diagnosis present

## 2024-03-10 DIAGNOSIS — J9601 Acute respiratory failure with hypoxia: Secondary | ICD-10-CM | POA: Diagnosis present

## 2024-03-10 DIAGNOSIS — R0600 Dyspnea, unspecified: Secondary | ICD-10-CM | POA: Diagnosis not present

## 2024-03-10 DIAGNOSIS — Z992 Dependence on renal dialysis: Secondary | ICD-10-CM | POA: Diagnosis not present

## 2024-03-10 DIAGNOSIS — E669 Obesity, unspecified: Secondary | ICD-10-CM | POA: Diagnosis present

## 2024-03-10 DIAGNOSIS — E785 Hyperlipidemia, unspecified: Secondary | ICD-10-CM | POA: Diagnosis present

## 2024-03-10 DIAGNOSIS — Z66 Do not resuscitate: Secondary | ICD-10-CM | POA: Diagnosis present

## 2024-03-10 DIAGNOSIS — I480 Paroxysmal atrial fibrillation: Secondary | ICD-10-CM | POA: Diagnosis present

## 2024-03-10 DIAGNOSIS — D631 Anemia in chronic kidney disease: Secondary | ICD-10-CM | POA: Diagnosis present

## 2024-03-10 DIAGNOSIS — Z79890 Hormone replacement therapy (postmenopausal): Secondary | ICD-10-CM | POA: Diagnosis not present

## 2024-03-10 DIAGNOSIS — R0602 Shortness of breath: Secondary | ICD-10-CM | POA: Diagnosis present

## 2024-03-10 DIAGNOSIS — J441 Chronic obstructive pulmonary disease with (acute) exacerbation: Secondary | ICD-10-CM | POA: Diagnosis present

## 2024-03-10 DIAGNOSIS — Z8249 Family history of ischemic heart disease and other diseases of the circulatory system: Secondary | ICD-10-CM | POA: Diagnosis not present

## 2024-03-10 DIAGNOSIS — K7581 Nonalcoholic steatohepatitis (NASH): Secondary | ICD-10-CM | POA: Diagnosis present

## 2024-03-10 DIAGNOSIS — J45901 Unspecified asthma with (acute) exacerbation: Secondary | ICD-10-CM | POA: Diagnosis present

## 2024-03-10 DIAGNOSIS — N186 End stage renal disease: Secondary | ICD-10-CM | POA: Diagnosis present

## 2024-03-10 DIAGNOSIS — J9811 Atelectasis: Secondary | ICD-10-CM | POA: Diagnosis present

## 2024-03-10 DIAGNOSIS — G4733 Obstructive sleep apnea (adult) (pediatric): Secondary | ICD-10-CM | POA: Diagnosis present

## 2024-03-10 DIAGNOSIS — Z1152 Encounter for screening for COVID-19: Secondary | ICD-10-CM | POA: Diagnosis not present

## 2024-03-10 DIAGNOSIS — M109 Gout, unspecified: Secondary | ICD-10-CM | POA: Diagnosis present

## 2024-03-10 DIAGNOSIS — R0609 Other forms of dyspnea: Secondary | ICD-10-CM | POA: Diagnosis not present

## 2024-03-10 DIAGNOSIS — I272 Pulmonary hypertension, unspecified: Secondary | ICD-10-CM | POA: Diagnosis present

## 2024-03-10 DIAGNOSIS — Z7901 Long term (current) use of anticoagulants: Secondary | ICD-10-CM | POA: Diagnosis not present

## 2024-03-10 LAB — RESPIRATORY PANEL BY PCR

## 2024-03-10 LAB — BASIC METABOLIC PANEL WITH GFR
Anion gap: 10 (ref 5–15)
BUN: 76 mg/dL — ABNORMAL HIGH (ref 8–23)
CO2: 23 mmol/L (ref 22–32)
Calcium: 8.9 mg/dL (ref 8.9–10.3)
Chloride: 102 mmol/L (ref 98–111)
Creatinine, Ser: 3.01 mg/dL — ABNORMAL HIGH (ref 0.44–1.00)
GFR, Estimated: 15 mL/min — ABNORMAL LOW (ref >60)
Glucose, Bld: 150 mg/dL — ABNORMAL HIGH (ref 70–99)
Potassium: 3.6 mmol/L (ref 3.5–5.1)
Sodium: 135 mmol/L (ref 135–145)

## 2024-03-10 LAB — CBC
HCT: 25.8 % — ABNORMAL LOW (ref 36.0–46.0)
Hemoglobin: 8.4 g/dL — ABNORMAL LOW (ref 12.0–15.0)
MCH: 30.3 pg (ref 26.0–34.0)
MCHC: 32.6 g/dL (ref 30.0–36.0)
MCV: 93.1 fL (ref 80.0–100.0)
Platelets: 196 10*3/uL (ref 150–400)
RBC: 2.77 MIL/uL — ABNORMAL LOW (ref 3.87–5.11)
RDW: 16.5 % — ABNORMAL HIGH (ref 11.5–15.5)
WBC: 8.2 10*3/uL (ref 4.0–10.5)
nRBC: 0 % (ref 0.0–0.2)

## 2024-03-10 LAB — CBG MONITORING, ED
Glucose-Capillary: 112 mg/dL — ABNORMAL HIGH (ref 70–99)
Glucose-Capillary: 144 mg/dL — ABNORMAL HIGH (ref 70–99)

## 2024-03-10 LAB — GLUCOSE, CAPILLARY: Glucose-Capillary: 116 mg/dL — ABNORMAL HIGH (ref 70–99)

## 2024-03-10 LAB — HEPATITIS B SURFACE ANTIGEN: Hepatitis B Surface Ag: NONREACTIVE

## 2024-03-10 MED ORDER — EPOETIN ALFA-EPBX 4000 UNIT/ML IJ SOLN
4000.0000 [IU] | INTRAMUSCULAR | Status: DC
  Administered 2024-03-10: 4000 [IU] via INTRAVENOUS
  Filled 2024-03-10: qty 1

## 2024-03-10 MED ORDER — CHLORHEXIDINE GLUCONATE CLOTH 2 % EX PADS
6.0000 | MEDICATED_PAD | Freq: Every day | CUTANEOUS | Status: DC
  Administered 2024-03-11 – 2024-03-12 (×2): 6 via TOPICAL
  Filled 2024-03-10: qty 6

## 2024-03-10 MED ORDER — MONTELUKAST SODIUM 10 MG PO TABS
10.0000 mg | ORAL_TABLET | Freq: Every day | ORAL | Status: DC
  Administered 2024-03-10 – 2024-03-11 (×2): 10 mg via ORAL
  Filled 2024-03-10 (×2): qty 1

## 2024-03-10 MED ORDER — EPOETIN ALFA-EPBX 4000 UNIT/ML IJ SOLN: 4000.0000 [IU] | INTRAMUSCULAR | Status: DC

## 2024-03-10 MED ORDER — ROSUVASTATIN CALCIUM 10 MG PO TABS
40.0000 mg | ORAL_TABLET | Freq: Every day | ORAL | Status: DC
  Administered 2024-03-10 – 2024-03-12 (×3): 40 mg via ORAL
  Filled 2024-03-10: qty 2
  Filled 2024-03-10 (×2): qty 4

## 2024-03-10 MED ORDER — INSULIN ASPART 100 UNIT/ML IJ SOLN
0.0000 [IU] | Freq: Three times a day (TID) | INTRAMUSCULAR | Status: DC
  Administered 2024-03-11: 1 [IU] via SUBCUTANEOUS
  Filled 2024-03-10: qty 1

## 2024-03-10 MED ORDER — EPOETIN ALFA-EPBX 4000 UNIT/ML IJ SOLN
INTRAMUSCULAR | Status: AC
  Filled 2024-03-10: qty 1

## 2024-03-10 MED ORDER — HYDRALAZINE HCL 50 MG PO TABS
100.0000 mg | ORAL_TABLET | Freq: Three times a day (TID) | ORAL | Status: DC
  Administered 2024-03-10 – 2024-03-12 (×6): 100 mg via ORAL
  Filled 2024-03-10 (×6): qty 2

## 2024-03-10 MED ORDER — FLUTICASONE FUROATE-VILANTEROL 200-25 MCG/ACT IN AEPB
1.0000 | INHALATION_SPRAY | Freq: Every day | RESPIRATORY_TRACT | Status: DC
  Administered 2024-03-10 – 2024-03-12 (×3): 1 via RESPIRATORY_TRACT
  Filled 2024-03-10 (×2): qty 28

## 2024-03-10 MED ORDER — METOPROLOL TARTRATE 25 MG PO TABS: 25.0000 mg | ORAL_TABLET | Freq: Two times a day (BID) | ORAL | Status: DC

## 2024-03-10 MED ORDER — ALLOPURINOL 100 MG PO TABS
100.0000 mg | ORAL_TABLET | Freq: Every day | ORAL | Status: DC
  Administered 2024-03-10 – 2024-03-12 (×3): 100 mg via ORAL
  Filled 2024-03-10 (×3): qty 1

## 2024-03-10 MED ORDER — AMLODIPINE BESYLATE 5 MG PO TABS
5.0000 mg | ORAL_TABLET | Freq: Every day | ORAL | Status: DC
  Administered 2024-03-10 – 2024-03-12 (×3): 5 mg via ORAL
  Filled 2024-03-10 (×3): qty 1

## 2024-03-10 MED ORDER — FUROSEMIDE 10 MG/ML IJ SOLN: 40.0000 mg | Freq: Two times a day (BID) | INTRAMUSCULAR | Status: DC

## 2024-03-10 MED ORDER — INSULIN ASPART 100 UNIT/ML IJ SOLN: 0.0000 [IU] | Freq: Every day | INTRAMUSCULAR | Status: DC

## 2024-03-10 NOTE — ED Notes (Signed)
 Pt to bedside commode. NADN.

## 2024-03-10 NOTE — ED Notes (Signed)
 Report off to taylor and [employer]

## 2024-03-10 NOTE — Progress Notes (Signed)
 Hemodialysis Note:  Received patient in bed to unit. Alert and oriented. Informed consent singed and in chart.  Treatment initiated: 1227 Treatment completed: 1600  Access used: Left internal jugular catheter Access issues: None  Patient tolerated well. Transported back to room, alert without acute distress. Report given to patient's RN.  Total UF removed: 2 liters Medications given: Retacrit  4000 units IV  Post HD weight: unable to get weight, bed scale not working  [facility]

## 2024-03-10 NOTE — ED Notes (Signed)
 Pt provided with warm blanket, pillow, and adjusted in bed. No other comfort measures requested at this time.

## 2024-03-10 NOTE — ED Notes (Signed)
 Pt to and from bedside commode, transporter at bedside, pt to dialysis.

## 2024-03-10 NOTE — ED Notes (Signed)
Fsbs 144

## 2024-03-10 NOTE — Progress Notes (Addendum)
 PROGRESS NOTE    [name]  WUJ:[id number] DOB: 1941-05-02 DOA: 03/09/2024 PCP: Ostwalt, Janna, PA-C     Brief Narrative:   From admission h and p  [name] is a 83 y.o. female with medical history significant for CKD 4, ESRD, initiated on dialysis a couple months ago, diastolic CHF, HTN, surgical hypothyroidism, gout, COPD, OSA on CPAP  paroxysmal atrial fibrillation on  Eliquis , being admitted with shortness of breath secondary to suspected fluid overload after presenting to the ED for the second time in 2 days.  On her first visit she was treated for COPD and prescribed a dose of steroids however she states she continues to have shortness of breath and feel poorly.  She has been compliant with her dialysis however states they have not been taking a lot of fluid off.  She denies chest pain, fever or chills. Past discharge summary  from 3/21 to 4/1 , admitted with acute respiratory failure of multifactorial etiology related to CAP/COPD/CHF exacerbation.  She required O2 up to 4 L but was weaned to room air.  She initiated dialysis during that hospitalization. In the ED BP 162/60 and tachypneic to 30 but maintaining O2 sats in the mid 90s on room air.  Labs notable for troponin 37 and BNP 2527 with normal WBC and negative respiratory viral panel.  VBG was unremarkable with venous pH of 7.5 and low pCO2 of 39.  Hemoglobin near baseline at 8.5, BNP in keeping with dialysis status with normal potassium and bicarb EKG showing sinus rhythm at 77 with no acute ST abnormalities and chest x-ray showed worsening right greater than left bibasilar atelectasis with a small left pleural effusion. Patient was treated with DuoNebs, methylprednisolone  and given a dose of IV Lasix  she was also empirically started on ceftriaxone  and doxycycline  for possible acute bronchitis.  Admission requested    Assessment & Plan:   Principal Problem:   Acute dyspnea Active Problems:   Acute on chronic diastolic  congestive heart failure (HCC)   COPD with acute exacerbation (HCC)   Paroxysmal atrial fibrillation (HCC)   ESRD on dialysis (HCC)   OSA (obstructive sleep apnea)   Essential hypertension   Type 2 diabetes mellitus with obesity (HCC)   Hypothyroidism   Anemia in chronic kidney disease  # HFpEF With acute exacerbation. Volume mgmt with relatively new dialysis likely playing a role. Does make urine. Unlikely PE as on apixaban  though is on 2.5 bid not 5 - hemodialysis as below - lasix  40 IV bid for home lasix  40 oral daily - update TTE, last was about a year ago  # COPD With possible mild exacerbation, does not appear to have cap - will continue steroids, duonebs - home breo, singulair  - holding on abx  # Acute hypoxic respiratory failure Requiring 2 liters, 2/2 above processes - Organ O2, wean as able  # ESRD Nephrology consulted for hemodialysis  # A-fib, paroxysmal Rate controlled - cont home metop, apixaban   # hypothyroid - home synthroid   # GAD - home sertraline   # OSA - cpap at bedtime  # T2DM Not on meds at home but will be on steroids here - ssi  # HTN Bp elevated - resume home amlodipine , hydralazine , metoprolol   # Anemia of ckd Hgb in the 8s, stable - per nephrology  # Gout No exacerbation - home allopurinol     DVT prophylaxis: apixaban  Code Status: dnr/dni Family Communication: none at bedside, no answer when niece telephoned today  Level  of care: Progressive Status is: Observation    Consultants:  nephrology  Procedures: none  Antimicrobials:  S/p abx in the ER    Subjective: Reports stable dyspnea, occasional cough  Objective: Vitals:   03/10/24 0830 03/10/24 0900 03/10/24 0930 03/10/24 1000  BP: (!) 154/56 (!) 165/66 (!) 156/61 (!) 155/53  Pulse: 63 70 64 68  Resp: (!) 21 17 (!) 26 18  Temp:      TempSrc:      SpO2: 96% 93% 94% 94%    Intake/Output Summary (Last 24 hours) at 03/10/2024 1057 Last data filed at  03/10/2024 1021 Gross per 24 hour  Intake 100 ml  Output 2400 ml  Net -2300 ml   There were no vitals filed for this visit.  Examination:  General exam: Appears calm and comfortable  Respiratory system: rales at bases otherwise clear Cardiovascular system: S1 & S2 heard, RRR.   Gastrointestinal system: Abdomen is nondistended, soft and nontender.   Central nervous system: Alert and oriented. No focal neurological deficits. Extremities: Symmetric 5 x 5 power. Mild edema to knees Skin: No rashes, lesions or ulcers Psychiatry: Judgement and insight appear normal. Mood & affect appropriate.     Data Reviewed: I have personally reviewed following labs and imaging studies  CBC: Recent Labs  Lab 03/05/24 1747 03/09/24 1857 03/10/24 0537  WBC 5.8 8.6 8.2  HGB 8.0* 8.5* 8.4*  HCT 24.8* 26.1* 25.8*  MCV 92.2 92.2 93.1  PLT 136* 193 196   Basic Metabolic Panel: Recent Labs  Lab 03/05/24 1747 03/09/24 1857 03/10/24 0537  NA 137 135 135  K 2.9* 3.8 3.6  CL 94* 99 102  CO2 29 23 23   GLUCOSE 173* 133* 150*  BUN 14 75* 76*  CREATININE 1.33* 3.21* 3.01*  CALCIUM  8.4* 9.0 8.9   GFR: Estimated Creatinine Clearance: 13.3 mL/min (A) (by C-G formula based on SCr of 3.01 mg/dL (H)). Liver Function Tests: No results for input(s): "AST", "ALT", "ALKPHOS", "BILITOT", "PROT", "ALBUMIN" in the last 168 hours. No results for input(s): "LIPASE", "AMYLASE" in the last 168 hours. No results for input(s): "AMMONIA" in the last 168 hours. Coagulation Profile: No results for input(s): "INR", "PROTIME" in the last 168 hours. Cardiac Enzymes: No results for input(s): "CKTOTAL", "CKMB", "CKMBINDEX", "TROPONINI" in the last 168 hours. BNP (last 3 results) No results for input(s): "PROBNP" in the last 8760 hours. HbA1C: No results for input(s): "HGBA1C" in the last 72 hours. CBG: No results for input(s): "GLUCAP" in the last 168 hours. Lipid Profile: No results for input(s): "CHOL", "HDL",  "LDLCALC", "TRIG", "CHOLHDL", "LDLDIRECT" in the last 72 hours. Thyroid  Function Tests: No results for input(s): "TSH", "T4TOTAL", "FREET4", "T3FREE", "THYROIDAB" in the last 72 hours. Anemia Panel: No results for input(s): "VITAMINB12", "FOLATE", "FERRITIN", "TIBC", "IRON", "RETICCTPCT" in the last 72 hours. Urine analysis:    Component Value Date/Time   COLORURINE YELLOW (A) 09/24/2023 1050   APPEARANCEUR CLOUDY (A) 09/24/2023 1050   APPEARANCEUR Clear 09/17/2018 1028   LABSPEC 1.012 09/24/2023 1050   LABSPEC 1.013 06/20/2012 1508   PHURINE 5.0 09/24/2023 1050   GLUCOSEU NEGATIVE 09/24/2023 1050   GLUCOSEU Negative 06/20/2012 1508   HGBUR SMALL (A) 09/24/2023 1050   BILIRUBINUR NEGATIVE 09/24/2023 1050   BILIRUBINUR Negative 09/17/2018 1028   BILIRUBINUR Negative 06/20/2012 1508   KETONESUR NEGATIVE 09/24/2023 1050   PROTEINUR 100 (A) 09/24/2023 1050   NITRITE NEGATIVE 09/24/2023 1050   LEUKOCYTESUR LARGE (A) 09/24/2023 1050   LEUKOCYTESUR Negative 06/20/2012 1508  Sepsis Labs: @LABRCNTIP (procalcitonin:4,lacticidven:4)  ) Recent Results (from the past 240 hours)  Resp panel by RT-PCR (RSV, Flu A&B, Covid) Anterior Nasal Swab     Status: None   Collection Time: 03/05/24  5:47 PM   Specimen: Anterior Nasal Swab  Result Value Ref Range Status   SARS Coronavirus 2 by RT PCR NEGATIVE NEGATIVE Final    Comment: (NOTE) SARS-CoV-2 target nucleic acids are NOT DETECTED.  The SARS-CoV-2 RNA is generally detectable in upper respiratory specimens during the acute phase of infection. The lowest concentration of SARS-CoV-2 viral copies this assay can detect is 138 copies/mL. A negative result does not preclude SARS-Cov-2 infection and should not be used as the sole basis for treatment or other patient management decisions. A negative result may occur with  improper specimen collection/handling, submission of specimen other than nasopharyngeal swab, presence of viral mutation(s)  within the areas targeted by this assay, and inadequate number of viral copies(<138 copies/mL). A negative result must be combined with clinical observations, patient history, and epidemiological information. The expected result is Negative.  Fact Sheet for Patients:  BloggerCourse.com  Fact Sheet for Healthcare Providers:  SeriousBroker.it  This test is no t yet approved or cleared by the [place]  FDA and  has been authorized for detection and/or diagnosis of SARS-CoV-2 by FDA under an Emergency Use Authorization (EUA). This EUA will remain  in effect (meaning this test can be used) for the duration of the COVID-19 declaration under Section 564(b)(1) of the Act, 21 U.S.C.section 360bbb-3(b)(1), unless the authorization is terminated  or revoked sooner.       Influenza A by PCR NEGATIVE NEGATIVE Final   Influenza B by PCR NEGATIVE NEGATIVE Final    Comment: (NOTE) The Xpert Xpress SARS-CoV-2/FLU/RSV plus assay is intended as an aid in the diagnosis of influenza from Nasopharyngeal swab specimens and should not be used as a sole basis for treatment. Nasal washings and aspirates are unacceptable for Xpert Xpress SARS-CoV-2/FLU/RSV testing.  Fact Sheet for Patients: BloggerCourse.com  Fact Sheet for Healthcare Providers: SeriousBroker.it  This test is not yet approved or cleared by the [place]  FDA and has been authorized for detection and/or diagnosis of SARS-CoV-2 by FDA under an Emergency Use Authorization (EUA). This EUA will remain in effect (meaning this test can be used) for the duration of the COVID-19 declaration under Section 564(b)(1) of the Act, 21 U.S.C. section 360bbb-3(b)(1), unless the authorization is terminated or revoked.     Resp Syncytial Virus by PCR NEGATIVE NEGATIVE Final    Comment: (NOTE) Fact Sheet for  Patients: BloggerCourse.com  Fact Sheet for Healthcare Providers: SeriousBroker.it  This test is not yet approved or cleared by the [place]  FDA and has been authorized for detection and/or diagnosis of SARS-CoV-2 by FDA under an Emergency Use Authorization (EUA). This EUA will remain in effect (meaning this test can be used) for the duration of the COVID-19 declaration under Section 564(b)(1) of the Act, 21 U.S.C. section 360bbb-3(b)(1), unless the authorization is terminated or revoked.  Performed at [facility], [address] [place], [place] [address]   Resp panel by RT-PCR (RSV, Flu A&B, Covid) Anterior Nasal Swab     Status: None   Collection Time: 03/09/24  7:18 PM   Specimen: Anterior Nasal Swab  Result Value Ref Range Status   SARS Coronavirus 2 by RT PCR NEGATIVE NEGATIVE Final    Comment: (NOTE) SARS-CoV-2 target nucleic acids are NOT DETECTED.  The SARS-CoV-2 RNA is generally detectable  in upper respiratory specimens during the acute phase of infection. The lowest concentration of SARS-CoV-2 viral copies this assay can detect is 138 copies/mL. A negative result does not preclude SARS-Cov-2 infection and should not be used as the sole basis for treatment or other patient management decisions. A negative result may occur with  improper specimen collection/handling, submission of specimen other than nasopharyngeal swab, presence of viral mutation(s) within the areas targeted by this assay, and inadequate number of viral copies(<138 copies/mL). A negative result must be combined with clinical observations, patient history, and epidemiological information. The expected result is Negative.  Fact Sheet for Patients:  BloggerCourse.com  Fact Sheet for Healthcare Providers:  SeriousBroker.it  This test is no t yet approved or cleared by the [place]   FDA and  has been authorized for detection and/or diagnosis of SARS-CoV-2 by FDA under an Emergency Use Authorization (EUA). This EUA will remain  in effect (meaning this test can be used) for the duration of the COVID-19 declaration under Section 564(b)(1) of the Act, 21 U.S.C.section 360bbb-3(b)(1), unless the authorization is terminated  or revoked sooner.       Influenza A by PCR NEGATIVE NEGATIVE Final   Influenza B by PCR NEGATIVE NEGATIVE Final    Comment: (NOTE) The Xpert Xpress SARS-CoV-2/FLU/RSV plus assay is intended as an aid in the diagnosis of influenza from Nasopharyngeal swab specimens and should not be used as a sole basis for treatment. Nasal washings and aspirates are unacceptable for Xpert Xpress SARS-CoV-2/FLU/RSV testing.  Fact Sheet for Patients: BloggerCourse.com  Fact Sheet for Healthcare Providers: SeriousBroker.it  This test is not yet approved or cleared by the [place]  FDA and has been authorized for detection and/or diagnosis of SARS-CoV-2 by FDA under an Emergency Use Authorization (EUA). This EUA will remain in effect (meaning this test can be used) for the duration of the COVID-19 declaration under Section 564(b)(1) of the Act, 21 U.S.C. section 360bbb-3(b)(1), unless the authorization is terminated or revoked.     Resp Syncytial Virus by PCR NEGATIVE NEGATIVE Final    Comment: (NOTE) Fact Sheet for Patients: BloggerCourse.com  Fact Sheet for Healthcare Providers: SeriousBroker.it  This test is not yet approved or cleared by the [place]  FDA and has been authorized for detection and/or diagnosis of SARS-CoV-2 by FDA under an Emergency Use Authorization (EUA). This EUA will remain in effect (meaning this test can be used) for the duration of the COVID-19 declaration under Section 564(b)(1) of the Act, 21 U.S.C. section  360bbb-3(b)(1), unless the authorization is terminated or revoked.  Performed at [facility], [address] [place], [place] [address]   Respiratory (~20 pathogens) panel by PCR     Status: None   Collection Time: 03/09/24 10:00 PM   Specimen: Nasopharyngeal Swab; Respiratory  Result Value Ref Range Status   Adenovirus NOT DETECTED NOT DETECTED Final   Coronavirus 229E NOT DETECTED NOT DETECTED Final    Comment: (NOTE) The Coronavirus on the Respiratory Panel, DOES NOT test for the novel  Coronavirus (2019 nCoV)    Coronavirus HKU1 NOT DETECTED NOT DETECTED Final   Coronavirus NL63 NOT DETECTED NOT DETECTED Final   Coronavirus OC43 NOT DETECTED NOT DETECTED Final   Metapneumovirus NOT DETECTED NOT DETECTED Final   Rhinovirus / Enterovirus NOT DETECTED NOT DETECTED Final   Influenza A NOT DETECTED NOT DETECTED Final   Influenza B NOT DETECTED NOT DETECTED Final   Parainfluenza Virus 1 NOT DETECTED NOT DETECTED Final   Parainfluenza Virus  2 NOT DETECTED NOT DETECTED Final   Parainfluenza Virus 3 NOT DETECTED NOT DETECTED Final   Parainfluenza Virus 4 NOT DETECTED NOT DETECTED Final   Respiratory Syncytial Virus NOT DETECTED NOT DETECTED Final   Bordetella pertussis NOT DETECTED NOT DETECTED Final   Bordetella Parapertussis NOT DETECTED NOT DETECTED Final   Chlamydophila pneumoniae NOT DETECTED NOT DETECTED Final   Mycoplasma pneumoniae NOT DETECTED NOT DETECTED Final    Comment: Performed at [facility], [address], [place], [place] [address]         Radiology Studies: DG Chest Port 1 View Result Date: 03/09/2024 CLINICAL DATA:  Shortness of breath EXAM: PORTABLE CHEST 1 VIEW COMPARISON:  03/05/2024 FINDINGS: Unchanged position of dual lumen catheter with tip in the SVC. The tips are separated with 1 oriented slightly superiorly. Unchanged cardiomegaly. Worsening right greater than left bibasilar atelectasis. Small left pleural effusion. IMPRESSION: 1.  Worsening right greater than left bibasilar atelectasis. 2. Small left pleural effusion. Electronically Signed   By: Juanetta Nordmann M.D.   On: 03/09/2024 19:42        Scheduled Meds:  apixaban   2.5 mg Oral BID   Chlorhexidine  Gluconate Cloth  6 each Topical Q0600   furosemide   40 mg Intravenous Q12H   guaiFENesin   600 mg Oral BID   ipratropium-albuterol   3 mL Nebulization Q6H   levothyroxine   75 mcg Oral Daily   methylPREDNISolone  (SOLU-MEDROL ) injection  40 mg Intravenous Q12H   Followed by   [name] ON 03/11/2024] predniSONE   40 mg Oral Q breakfast   metoprolol  tartrate  25 mg Oral BID   sertraline   100 mg Oral Daily   Continuous Infusions:   LOS: 0 days     Raymonde Calico, MD [facility]   If 7PM-7AM, please contact night-coverage [email or web address] Password [email or web address] 03/10/2024, 10:57 AM

## 2024-03-10 NOTE — ED Notes (Signed)
 Pt to dialysis.

## 2024-03-10 NOTE — Progress Notes (Signed)
 [facility]  ROUNDING NOTE   Subjective:   [name] is a 83 y.o. female with past medical conditions including diastolic heart failure, hypertension, hypothyroidism, COPD, and end stage renal disease on hemodialysis.  Patient presents to the emergency department complaining of shortness of breath and has been admitted for SOB (shortness of breath) [R06.02] ESRD (end stage renal disease) on dialysis (HCC) [N18.6, Z99.2] Acute dyspnea [R06.00] Exacerbation of asthma, unspecified asthma severity, unspecified whether persistent [J45.901]  Patient is known to our practice and receives outpatient dialysis treatments at [facility], on a MWF schedule, supervised by Dr Rhesa Celeste. She states she has not missed any recent treatments. She is prescribed Prednisone  for a COPD exacerbation on 6/4. Patient feels this is the cause of her shortness of breath. She does appear short of breath with conversation and minimal activity.   Labs on ED arrival unremarkable for renal patient.  Hemoglobin 8.5.  Respiratory panel negative for influenza, COVID-19, and RSV.  Expanded respiratory panel also negative.  Chest x-ray shows worsening bilateral atelectasis, right greater than left, and small pleural effusion.  We have been consulted to manage dialysis needs.   Objective:  Vital signs in last 24 hours:  Temp:  [97.5 F (36.4 C)-98.4 F (36.9 C)] 98 F (36.7 C) (06/09 1217) Pulse Rate:  [57-97] 69 (06/09 1227) Resp:  [13-36] 25 (06/09 1227) BP: (139-182)/(53-108) 162/59 (06/09 1227) SpO2:  [91 %-100 %] 94 % (06/09 1227)  Weight change:  There were no vitals filed for this visit.  Intake/Output: I/O last 3 completed shifts: In: 100 [IV Piggyback:100] Out: 2100 [Urine:2100]   Intake/Output this shift:  Total I/O In: -  Out: 800 [Urine:800]  Physical Exam: General: NAD  Head: Normocephalic, atraumatic. Moist oral mucosal membranes  Eyes: Anicteric  Neck: Supple  Lungs:   Bilateral rales, tachypnea  Heart: Regular rate and rhythm  Abdomen:  Soft, nontender  Extremities: Trace peripheral edema.  Neurologic: Awake, alert, conversant  Skin: Warm,dry, no rash  Access: Right IJ PermCath, left aVF    Basic Metabolic Panel: Recent Labs  Lab 03/05/24 1747 03/09/24 1857 03/10/24 0537  NA 137 135 135  K 2.9* 3.8 3.6  CL 94* 99 102  CO2 29 23 23   GLUCOSE 173* 133* 150*  BUN 14 75* 76*  CREATININE 1.33* 3.21* 3.01*  CALCIUM  8.4* 9.0 8.9    Liver Function Tests: No results for input(s): "AST", "ALT", "ALKPHOS", "BILITOT", "PROT", "ALBUMIN" in the last 168 hours. No results for input(s): "LIPASE", "AMYLASE" in the last 168 hours. No results for input(s): "AMMONIA" in the last 168 hours.  CBC: Recent Labs  Lab 03/05/24 1747 03/09/24 1857 03/10/24 0537  WBC 5.8 8.6 8.2  HGB 8.0* 8.5* 8.4*  HCT 24.8* 26.1* 25.8*  MCV 92.2 92.2 93.1  PLT 136* 193 196    Cardiac Enzymes: No results for input(s): "CKTOTAL", "CKMB", "CKMBINDEX", "TROPONINI" in the last 168 hours.  BNP: Invalid input(s): "POCBNP"  CBG: Recent Labs  Lab 03/10/24 1201  GLUCAP 112*    Microbiology: Results for orders placed or performed during the hospital encounter of 03/09/24  Resp panel by RT-PCR (RSV, Flu A&B, Covid) Anterior Nasal Swab     Status: None   Collection Time: 03/09/24  7:18 PM   Specimen: Anterior Nasal Swab  Result Value Ref Range Status   SARS Coronavirus 2 by RT PCR NEGATIVE NEGATIVE Final    Comment: (NOTE) SARS-CoV-2 target nucleic acids are NOT DETECTED.  The SARS-CoV-2 RNA  is generally detectable in upper respiratory specimens during the acute phase of infection. The lowest concentration of SARS-CoV-2 viral copies this assay can detect is 138 copies/mL. A negative result does not preclude SARS-Cov-2 infection and should not be used as the sole basis for treatment or other patient management decisions. A negative result may occur with  improper  specimen collection/handling, submission of specimen other than nasopharyngeal swab, presence of viral mutation(s) within the areas targeted by this assay, and inadequate number of viral copies(<138 copies/mL). A negative result must be combined with clinical observations, patient history, and epidemiological information. The expected result is Negative.  Fact Sheet for Patients:  BloggerCourse.com  Fact Sheet for Healthcare Providers:  SeriousBroker.it  This test is no t yet approved or cleared by the [place]  FDA and  has been authorized for detection and/or diagnosis of SARS-CoV-2 by FDA under an Emergency Use Authorization (EUA). This EUA will remain  in effect (meaning this test can be used) for the duration of the COVID-19 declaration under Section 564(b)(1) of the Act, 21 U.S.C.section 360bbb-3(b)(1), unless the authorization is terminated  or revoked sooner.       Influenza A by PCR NEGATIVE NEGATIVE Final   Influenza B by PCR NEGATIVE NEGATIVE Final    Comment: (NOTE) The Xpert Xpress SARS-CoV-2/FLU/RSV plus assay is intended as an aid in the diagnosis of influenza from Nasopharyngeal swab specimens and should not be used as a sole basis for treatment. Nasal washings and aspirates are unacceptable for Xpert Xpress SARS-CoV-2/FLU/RSV testing.  Fact Sheet for Patients: BloggerCourse.com  Fact Sheet for Healthcare Providers: SeriousBroker.it  This test is not yet approved or cleared by the [place]  FDA and has been authorized for detection and/or diagnosis of SARS-CoV-2 by FDA under an Emergency Use Authorization (EUA). This EUA will remain in effect (meaning this test can be used) for the duration of the COVID-19 declaration under Section 564(b)(1) of the Act, 21 U.S.C. section 360bbb-3(b)(1), unless the authorization is terminated or revoked.     Resp  Syncytial Virus by PCR NEGATIVE NEGATIVE Final    Comment: (NOTE) Fact Sheet for Patients: BloggerCourse.com  Fact Sheet for Healthcare Providers: SeriousBroker.it  This test is not yet approved or cleared by the [place]  FDA and has been authorized for detection and/or diagnosis of SARS-CoV-2 by FDA under an Emergency Use Authorization (EUA). This EUA will remain in effect (meaning this test can be used) for the duration of the COVID-19 declaration under Section 564(b)(1) of the Act, 21 U.S.C. section 360bbb-3(b)(1), unless the authorization is terminated or revoked.  Performed at [facility], [address] [place], [place] [address]   Respiratory (~20 pathogens) panel by PCR     Status: None   Collection Time: 03/09/24 10:00 PM   Specimen: Nasopharyngeal Swab; Respiratory  Result Value Ref Range Status   Adenovirus NOT DETECTED NOT DETECTED Final   Coronavirus 229E NOT DETECTED NOT DETECTED Final    Comment: (NOTE) The Coronavirus on the Respiratory Panel, DOES NOT test for the novel  Coronavirus (2019 nCoV)    Coronavirus HKU1 NOT DETECTED NOT DETECTED Final   Coronavirus NL63 NOT DETECTED NOT DETECTED Final   Coronavirus OC43 NOT DETECTED NOT DETECTED Final   Metapneumovirus NOT DETECTED NOT DETECTED Final   Rhinovirus / Enterovirus NOT DETECTED NOT DETECTED Final   Influenza A NOT DETECTED NOT DETECTED Final   Influenza B NOT DETECTED NOT DETECTED Final   Parainfluenza Virus 1 NOT DETECTED NOT DETECTED Final  Parainfluenza Virus 2 NOT DETECTED NOT DETECTED Final   Parainfluenza Virus 3 NOT DETECTED NOT DETECTED Final   Parainfluenza Virus 4 NOT DETECTED NOT DETECTED Final   Respiratory Syncytial Virus NOT DETECTED NOT DETECTED Final   Bordetella pertussis NOT DETECTED NOT DETECTED Final   Bordetella Parapertussis NOT DETECTED NOT DETECTED Final   Chlamydophila pneumoniae NOT DETECTED NOT DETECTED Final    Mycoplasma pneumoniae NOT DETECTED NOT DETECTED Final    Comment: Performed at [facility], [address] [place], [place] [address]    Coagulation Studies: No results for input(s): "LABPROT", "INR" in the last 72 hours.  Urinalysis: No results for input(s): "COLORURINE", "LABSPEC", "PHURINE", "GLUCOSEU", "HGBUR", "BILIRUBINUR", "KETONESUR", "PROTEINUR", "UROBILINOGEN", "NITRITE", "LEUKOCYTESUR" in the last 72 hours.  Invalid input(s): "APPERANCEUR"    Imaging: DG Chest Port 1 View Result Date: 03/09/2024 CLINICAL DATA:  Shortness of breath EXAM: PORTABLE CHEST 1 VIEW COMPARISON:  03/05/2024 FINDINGS: Unchanged position of dual lumen catheter with tip in the SVC. The tips are separated with 1 oriented slightly superiorly. Unchanged cardiomegaly. Worsening right greater than left bibasilar atelectasis. Small left pleural effusion. IMPRESSION: 1. Worsening right greater than left bibasilar atelectasis. 2. Small left pleural effusion. Electronically Signed   By: Juanetta Nordmann M.D.   On: 03/09/2024 19:42     Medications:     allopurinol   100 mg Oral Daily   amLODipine   5 mg Oral Daily   apixaban   2.5 mg Oral BID   Chlorhexidine  Gluconate Cloth  6 each Topical Q0600   fluticasone  furoate-vilanterol  1 puff Inhalation Daily   furosemide   40 mg Intravenous Q12H   guaiFENesin   600 mg Oral BID   hydrALAZINE   100 mg Oral TID   insulin  aspart  0-5 Units Subcutaneous QHS   insulin  aspart  0-6 Units Subcutaneous TID WC   ipratropium-albuterol   3 mL Nebulization Q6H   levothyroxine   75 mcg Oral Daily   methylPREDNISolone  (SOLU-MEDROL ) injection  40 mg Intravenous Q12H   Followed by   [name] ON 03/11/2024] predniSONE   40 mg Oral Q breakfast   metoprolol  tartrate  25 mg Oral BID   montelukast   10 mg Oral QHS   rosuvastatin   40 mg Oral Daily   sertraline   100 mg Oral Daily   acetaminophen  **OR** acetaminophen , albuterol , ondansetron  **OR** ondansetron  (ZOFRAN ) IV  Assessment/  Plan:  [name] is a 83 y.o.  female with past medical conditions including diastolic heart failure, hypertension, hypothyroidism, COPD, and end stage renal disease on hemodialysis.  She presents to the emergency department complaining of shortness of breath and has been admitted for SOB (shortness of breath) [R06.02] ESRD (end stage renal disease) on dialysis (HCC) [N18.6, Z99.2] Acute dyspnea [R06.00] Exacerbation of asthma, unspecified asthma severity, unspecified whether persistent [J45.901]   End-stage renal disease on hemodialysis.  Last treatment received on Friday.  Patient will receive dialysis today, UF goal 2 liters as tolerated.  Next treatment scheduled for Wednesday.  2.  Acute respiratory failure, patient on 2 L nasal cannula.  Chest x-ray shows bilateral atelectasis, right greater than left along with small pleural effusion.  Will attempt fluid removal with dialysis.  3. Anemia of chronic kidney disease Lab Results  Component Value Date   HGB 8.4 (L) 03/10/2024    Hemoglobin below optimal range.  Will order low-dose Retacrit  4000 units with dialysis.  4. Secondary Hyperparathyroidism: with outpatient labs: PTH 12, phosphorus 6.5, calcium  10 on 12/13/2023.   Lab Results  Component  Value Date   CALCIUM  8.9 03/10/2024   PHOS 2.4 (L) 12/29/2023    Calcium  acceptable at this time.  Will continue to monitor bone minerals.  5.  Hypertension with chronic kidney disease Home regimen includes amlodipine , furosemide , hydralazine , and metoprolol .  All have been continued during this admission.    LOS: 0 [name] 6/9/20251:44 PM

## 2024-03-11 ENCOUNTER — Inpatient Hospital Stay (HOSPITAL_COMMUNITY)
Admit: 2024-03-11 | Discharge: 2024-03-11 | Disposition: A | Discharge status: Home / Self Care | Attending: Obstetrics and Gynecology | Admitting: Obstetrics and Gynecology

## 2024-03-11 DIAGNOSIS — I132 Hypertensive heart and chronic kidney disease with heart failure and with stage 5 chronic kidney disease, or end stage renal disease: Secondary | ICD-10-CM

## 2024-03-11 DIAGNOSIS — M1990 Unspecified osteoarthritis, unspecified site: Secondary | ICD-10-CM

## 2024-03-11 DIAGNOSIS — D631 Anemia in chronic kidney disease: Secondary | ICD-10-CM

## 2024-03-11 DIAGNOSIS — E039 Hypothyroidism, unspecified: Secondary | ICD-10-CM

## 2024-03-11 DIAGNOSIS — I5033 Acute on chronic diastolic (congestive) heart failure: Secondary | ICD-10-CM | POA: Diagnosis not present

## 2024-03-11 DIAGNOSIS — R0609 Other forms of dyspnea: Secondary | ICD-10-CM | POA: Diagnosis not present

## 2024-03-11 DIAGNOSIS — K219 Gastro-esophageal reflux disease without esophagitis: Secondary | ICD-10-CM

## 2024-03-11 DIAGNOSIS — J449 Chronic obstructive pulmonary disease, unspecified: Secondary | ICD-10-CM

## 2024-03-11 DIAGNOSIS — N186 End stage renal disease: Secondary | ICD-10-CM

## 2024-03-11 DIAGNOSIS — I480 Paroxysmal atrial fibrillation: Secondary | ICD-10-CM

## 2024-03-11 DIAGNOSIS — E1122 Type 2 diabetes mellitus with diabetic chronic kidney disease: Secondary | ICD-10-CM | POA: Diagnosis not present

## 2024-03-11 DIAGNOSIS — J9601 Acute respiratory failure with hypoxia: Secondary | ICD-10-CM

## 2024-03-11 DIAGNOSIS — E785 Hyperlipidemia, unspecified: Secondary | ICD-10-CM

## 2024-03-11 DIAGNOSIS — R0600 Dyspnea, unspecified: Secondary | ICD-10-CM | POA: Diagnosis not present

## 2024-03-11 LAB — ECHOCARDIOGRAM COMPLETE
AR max vel: 2.31 cm2
AV Area VTI: 2.34 cm2
AV Area mean vel: 2.29 cm2
AV Mean grad: 6 mmHg
AV Peak grad: 11.2 mmHg
Ao pk vel: 1.67 m/s
Area-P 1/2: 5.54 cm2
Height: 62 in
MV M vel: 4.98 m/s
MV Peak grad: 99.2 mmHg
MV VTI: 2.17 cm2
S' Lateral: 4.1 cm
Weight: 2550.28 [oz_av]

## 2024-03-11 LAB — BASIC METABOLIC PANEL WITH GFR
Anion gap: 10 (ref 5–15)
BUN: 55 mg/dL — ABNORMAL HIGH (ref 8–23)
CO2: 28 mmol/L (ref 22–32)
Calcium: 8.8 mg/dL — ABNORMAL LOW (ref 8.9–10.3)
Chloride: 98 mmol/L (ref 98–111)
Creatinine, Ser: 2.38 mg/dL — ABNORMAL HIGH (ref 0.44–1.00)
GFR, Estimated: 20 mL/min — ABNORMAL LOW (ref >60)
Glucose, Bld: 145 mg/dL — ABNORMAL HIGH (ref 70–99)
Potassium: 3.7 mmol/L (ref 3.5–5.1)
Sodium: 136 mmol/L (ref 135–145)

## 2024-03-11 LAB — GLUCOSE, CAPILLARY
Glucose-Capillary: 110 mg/dL — ABNORMAL HIGH (ref 70–99)
Glucose-Capillary: 71 mg/dL (ref 70–99)
Glucose-Capillary: 189 mg/dL — ABNORMAL HIGH (ref 70–99)
Glucose-Capillary: 138 mg/dL — ABNORMAL HIGH (ref 70–99)

## 2024-03-11 MED ORDER — IPRATROPIUM-ALBUTEROL 0.5-2.5 (3) MG/3ML IN SOLN
3.0000 mL | Freq: Two times a day (BID) | RESPIRATORY_TRACT | Status: DC
  Administered 2024-03-11 – 2024-03-12 (×2): 3 mL via RESPIRATORY_TRACT
  Filled 2024-03-11 (×2): qty 3

## 2024-03-11 MED ORDER — ALTEPLASE 2 MG IJ SOLR: 2.0000 mg | Freq: Once | INTRAMUSCULAR | Status: DC | PRN

## 2024-03-11 MED ORDER — HEPARIN SODIUM (PORCINE) 1000 UNIT/ML DIALYSIS: 1000.0000 [IU] | INTRAMUSCULAR | Status: DC | PRN

## 2024-03-11 NOTE — Progress Notes (Signed)
 [facility]  ROUNDING NOTE   Subjective:   [name] is a 83 y.o. female with past medical conditions including diastolic heart failure, hypertension, hypothyroidism, COPD, and end stage renal disease on hemodialysis.  Patient presents to the emergency department complaining of shortness of breath and has been admitted for SOB (shortness of breath) [R06.02] ESRD (end stage renal disease) on dialysis (HCC) [N18.6, Z99.2] Acute dyspnea [R06.00] Exacerbation of asthma, unspecified asthma severity, unspecified whether persistent [J45.901]  Patient is known to our practice and receives outpatient dialysis treatments at [facility], on a MWF schedule, supervised by Dr Rhesa Celeste.   Patient seen sitting at side of bed Denies shortness of breath at this time but states she does experience with activity Interested in oxygen availability at home Tolerated dialysis well yesterday   Objective:  Vital signs in last 24 hours:  Temp:  [97.7 F (36.5 C)-98.4 F (36.9 C)] 98.2 F (36.8 C) (06/10 1230) Pulse Rate:  [54-70] 62 (06/10 1230) Resp:  [15-35] 15 (06/10 1230) BP: (129-165)/(45-64) 145/53 (06/10 1230) SpO2:  [95 %-99 %] 99 % (06/10 1230) Weight:  [72.3 kg] 72.3 kg (06/10 0524)  Weight change:  Filed Weights   03/10/24 2135 03/11/24 0524  Weight: 72.3 kg 72.3 kg    Intake/Output: I/O last 3 completed shifts: In: 220 [P.O.:120; IV Piggyback:100] Out: 5500 [Urine:3500; Other:2000]   Intake/Output this shift:  Total I/O In: 240 [P.O.:240] Out: -   Physical Exam: General: NAD  Head: Normocephalic, atraumatic. Moist oral mucosal membranes  Eyes: Anicteric  Lungs:  Clear to auscultation, normal effort  Heart: Regular rate and rhythm  Abdomen:  Soft, nontender  Extremities: Trace peripheral edema.  Neurologic: Awake, alert, conversant  Skin: Warm,dry, no rash  Access: Right IJ PermCath, left aVF    Basic Metabolic Panel: Recent Labs  Lab 03/05/24 1747  03/09/24 1857 03/10/24 0537 03/11/24 0600  NA 137 135 135 136  K 2.9* 3.8 3.6 3.7  CL 94* 99 102 98  CO2 29 23 23 28   GLUCOSE 173* 133* 150* 145*  BUN 14 75* 76* 55*  CREATININE 1.33* 3.21* 3.01* 2.38*  CALCIUM  8.4* 9.0 8.9 8.8*    Liver Function Tests: No results for input(s): "AST", "ALT", "ALKPHOS", "BILITOT", "PROT", "ALBUMIN" in the last 168 hours. No results for input(s): "LIPASE", "AMYLASE" in the last 168 hours. No results for input(s): "AMMONIA" in the last 168 hours.  CBC: Recent Labs  Lab 03/05/24 1747 03/09/24 1857 03/10/24 0537  WBC 5.8 8.6 8.2  HGB 8.0* 8.5* 8.4*  HCT 24.8* 26.1* 25.8*  MCV 92.2 92.2 93.1  PLT 136* 193 196    Cardiac Enzymes: No results for input(s): "CKTOTAL", "CKMB", "CKMBINDEX", "TROPONINI" in the last 168 hours.  BNP: Invalid input(s): "POCBNP"  CBG: Recent Labs  Lab 03/10/24 1201 03/10/24 1658 03/10/24 2225 03/11/24 0828 03/11/24 1232  GLUCAP 112* 144* 116* 110* 71    Microbiology: Results for orders placed or performed during the hospital encounter of 03/09/24  Resp panel by RT-PCR (RSV, Flu A&B, Covid) Anterior Nasal Swab     Status: None   Collection Time: 03/09/24  7:18 PM   Specimen: Anterior Nasal Swab  Result Value Ref Range Status   SARS Coronavirus 2 by RT PCR NEGATIVE NEGATIVE Final    Comment: (NOTE) SARS-CoV-2 target nucleic acids are NOT DETECTED.  The SARS-CoV-2 RNA is generally detectable in upper respiratory specimens during the acute phase of infection. The lowest concentration of SARS-CoV-2 viral copies this  assay can detect is 138 copies/mL. A negative result does not preclude SARS-Cov-2 infection and should not be used as the sole basis for treatment or other patient management decisions. A negative result may occur with  improper specimen collection/handling, submission of specimen other than nasopharyngeal swab, presence of viral mutation(s) within the areas targeted by this assay, and  inadequate number of viral copies(<138 copies/mL). A negative result must be combined with clinical observations, patient history, and epidemiological information. The expected result is Negative.  Fact Sheet for Patients:  BloggerCourse.com  Fact Sheet for Healthcare Providers:  SeriousBroker.it  This test is no t yet approved or cleared by the [place]  FDA and  has been authorized for detection and/or diagnosis of SARS-CoV-2 by FDA under an Emergency Use Authorization (EUA). This EUA will remain  in effect (meaning this test can be used) for the duration of the COVID-19 declaration under Section 564(b)(1) of the Act, 21 U.S.C.section 360bbb-3(b)(1), unless the authorization is terminated  or revoked sooner.       Influenza A by PCR NEGATIVE NEGATIVE Final   Influenza B by PCR NEGATIVE NEGATIVE Final    Comment: (NOTE) The Xpert Xpress SARS-CoV-2/FLU/RSV plus assay is intended as an aid in the diagnosis of influenza from Nasopharyngeal swab specimens and should not be used as a sole basis for treatment. Nasal washings and aspirates are unacceptable for Xpert Xpress SARS-CoV-2/FLU/RSV testing.  Fact Sheet for Patients: BloggerCourse.com  Fact Sheet for Healthcare Providers: SeriousBroker.it  This test is not yet approved or cleared by the [place]  FDA and has been authorized for detection and/or diagnosis of SARS-CoV-2 by FDA under an Emergency Use Authorization (EUA). This EUA will remain in effect (meaning this test can be used) for the duration of the COVID-19 declaration under Section 564(b)(1) of the Act, 21 U.S.C. section 360bbb-3(b)(1), unless the authorization is terminated or revoked.     Resp Syncytial Virus by PCR NEGATIVE NEGATIVE Final    Comment: (NOTE) Fact Sheet for Patients: BloggerCourse.com  Fact Sheet for Healthcare  Providers: SeriousBroker.it  This test is not yet approved or cleared by the [place]  FDA and has been authorized for detection and/or diagnosis of SARS-CoV-2 by FDA under an Emergency Use Authorization (EUA). This EUA will remain in effect (meaning this test can be used) for the duration of the COVID-19 declaration under Section 564(b)(1) of the Act, 21 U.S.C. section 360bbb-3(b)(1), unless the authorization is terminated or revoked.  Performed at [facility], [address] [place], [place] [address]   Respiratory (~20 pathogens) panel by PCR     Status: None   Collection Time: 03/09/24 10:00 PM   Specimen: Nasopharyngeal Swab; Respiratory  Result Value Ref Range Status   Adenovirus NOT DETECTED NOT DETECTED Final   Coronavirus 229E NOT DETECTED NOT DETECTED Final    Comment: (NOTE) The Coronavirus on the Respiratory Panel, DOES NOT test for the novel  Coronavirus (2019 nCoV)    Coronavirus HKU1 NOT DETECTED NOT DETECTED Final   Coronavirus NL63 NOT DETECTED NOT DETECTED Final   Coronavirus OC43 NOT DETECTED NOT DETECTED Final   Metapneumovirus NOT DETECTED NOT DETECTED Final   Rhinovirus / Enterovirus NOT DETECTED NOT DETECTED Final   Influenza A NOT DETECTED NOT DETECTED Final   Influenza B NOT DETECTED NOT DETECTED Final   Parainfluenza Virus 1 NOT DETECTED NOT DETECTED Final   Parainfluenza Virus 2 NOT DETECTED NOT DETECTED Final   Parainfluenza Virus 3 NOT DETECTED NOT DETECTED Final  Parainfluenza Virus 4 NOT DETECTED NOT DETECTED Final   Respiratory Syncytial Virus NOT DETECTED NOT DETECTED Final   Bordetella pertussis NOT DETECTED NOT DETECTED Final   Bordetella Parapertussis NOT DETECTED NOT DETECTED Final   Chlamydophila pneumoniae NOT DETECTED NOT DETECTED Final   Mycoplasma pneumoniae NOT DETECTED NOT DETECTED Final    Comment: Performed at [facility], [address] [place], [place] [address]    Coagulation  Studies: No results for input(s): "LABPROT", "INR" in the last 72 hours.  Urinalysis: No results for input(s): "COLORURINE", "LABSPEC", "PHURINE", "GLUCOSEU", "HGBUR", "BILIRUBINUR", "KETONESUR", "PROTEINUR", "UROBILINOGEN", "NITRITE", "LEUKOCYTESUR" in the last 72 hours.  Invalid input(s): "APPERANCEUR"    Imaging: ECHOCARDIOGRAM COMPLETE Result Date: 03/11/2024    ECHOCARDIOGRAM REPORT   Patient Name:   KIRSTA PROBERT [facility] Date of Exam: 03/11/2024 Medical Rec #:  [id number]     Height:       62.0 in Accession #:    [id number]    Weight:       159.4 lb Date of Birth:  [birth year]     BSA:          1.736 m Patient Age:    83 years      BP:           142/51 mmHg Patient Gender: F             HR:           65 bpm. Exam Location:  [facility] Procedure: 2D Echo, Color Doppler and Cardiac Doppler (Both Spectral and Color            Flow Doppler were utilized during procedure). Indications:     Dyspnea R06.00  History:         Patient has prior history of Echocardiogram examinations, most                  recent 04/19/2023. Risk Factors:Hypertension and Dyslipidemia.  Sonographer:     Broadus Canes Referring Phys:  [id number] [facility] Diagnosing Phys: Sammy Crisp MD IMPRESSIONS  1. Left ventricular ejection fraction, by estimation, is 55 to 60%. The left ventricle has normal function. The left ventricle has no regional wall motion abnormalities. The left ventricular internal cavity size was severely dilated. There is mild left ventricular hypertrophy. Left ventricular diastolic parameters are consistent with Grade III diastolic dysfunction (restrictive). Elevated left atrial pressure.  2. Right ventricular systolic function is normal. The right ventricular size is normal. There is moderately elevated pulmonary artery systolic pressure.  3. Left atrial size was severely dilated.  4. The mitral valve is abnormal. Severe mitral valve regurgitation. No evidence of mitral stenosis.  5. Tricuspid valve regurgitation is mild to  moderate.  6. The aortic valve was not well visualized. Aortic valve regurgitation is not visualized. No aortic stenosis is present.  7. The inferior vena cava is normal in size with greater than 50% respiratory variability, suggesting right atrial pressure of 3 mmHg. Comparison(s): A prior study was performed on 04/19/2023. Changes from prior study are noted. Mitral regurgitation is significantly worse on today's study. FINDINGS  Left Ventricle: Left ventricular ejection fraction, by estimation, is 55 to 60%. The left ventricle has normal function. The left ventricle has no regional wall motion abnormalities. The left ventricular internal cavity size was severely dilated. There is mild left ventricular hypertrophy. Left ventricular diastolic parameters are consistent with Grade III diastolic dysfunction (restrictive). Elevated left atrial pressure. Right Ventricle: The right ventricular size is normal.  No increase in right ventricular wall thickness. Right ventricular systolic function is normal. There is moderately elevated pulmonary artery systolic pressure. The tricuspid regurgitant velocity is 3.71 m/s, and with an assumed right atrial pressure of 3 mmHg, the estimated right ventricular systolic pressure is 58.1 mmHg. Left Atrium: Left atrial size was severely dilated. Right Atrium: Right atrial size was normal in size. Pericardium: Trivial pericardial effusion is present. Mitral Valve: The mitral valve is abnormal. Mild mitral annular calcification. Severe mitral valve regurgitation, with posteriorly-directed jet. No evidence of mitral valve stenosis. MV peak gradient, 12.2 mmHg. The mean mitral valve gradient is 5.0 mmHg. Tricuspid Valve: The tricuspid valve is normal in structure. Tricuspid valve regurgitation is mild to moderate. Aortic Valve: The aortic valve was not well visualized. Aortic valve regurgitation is not visualized. No aortic stenosis is present. Aortic valve mean gradient measures 6.0 mmHg.  Aortic valve peak gradient measures 11.2 mmHg. Aortic valve area, by VTI measures 2.34 cm. Pulmonic Valve: The pulmonic valve was not well visualized. Pulmonic valve regurgitation is not visualized. No evidence of pulmonic stenosis. Aorta: The aortic root is normal in size and structure. Pulmonary Artery: The pulmonary artery is not well seen. Venous: The inferior vena cava is normal in size with greater than 50% respiratory variability, suggesting right atrial pressure of 3 mmHg. IAS/Shunts: The interatrial septum was not well visualized.  LEFT VENTRICLE PLAX 2D LVIDd:         6.20 cm   Diastology LVIDs:         4.10 cm   LV e' medial:    5.00 cm/s LV PW:         1.30 cm   LV E/e' medial:  29.6 LV IVS:        1.21 cm   LV e' lateral:   7.94 cm/s LVOT diam:     2.00 cm   LV E/e' lateral: 18.6 LV SV:         97 LV SV Index:   56 LVOT Area:     3.14 cm  RIGHT VENTRICLE RV Basal diam:  3.40 cm RV Mid diam:    3.30 cm RV S prime:     12.90 cm/s TAPSE (M-mode): 2.1 cm LEFT ATRIUM              Index        RIGHT ATRIUM          Index LA diam:        5.50 cm  3.17 cm/m   RA Area:     8.14 cm LA Vol (A2C):   124.0 ml 71.43 ml/m  RA Volume:   13.30 ml 7.66 ml/m LA Vol (A4C):   170.0 ml 97.93 ml/m LA Biplane Vol: 156.0 ml 89.87 ml/m  AORTIC VALVE AV Area (Vmax):    2.31 cm AV Area (Vmean):   2.29 cm AV Area (VTI):     2.34 cm AV Vmax:           167.00 cm/s AV Vmean:          116.000 cm/s AV VTI:            0.416 m AV Peak Grad:      11.2 mmHg AV Mean Grad:      6.0 mmHg LVOT Vmax:         123.00 cm/s LVOT Vmean:        84.600 cm/s LVOT VTI:          0.310  m LVOT/AV VTI ratio: 0.75  AORTA Ao Root diam: 3.00 cm MITRAL VALVE                TRICUSPID VALVE MV Area (PHT): 5.54 cm     TR Peak grad:   55.1 mmHg MV Area VTI:   2.17 cm     TR Vmax:        371.00 cm/s MV Peak grad:  12.2 mmHg MV Mean grad:  5.0 mmHg     SHUNTS MV Vmax:       1.75 m/s     Systemic VTI:  0.31 m MV Vmean:      101.0 cm/s   Systemic Diam: 2.00  cm MV Decel Time: 137 msec MR Peak grad: 99.2 mmHg MR Mean grad: 68.0 mmHg MR Vmax:      498.00 cm/s MR Vmean:     389.0 cm/s MR PISA:      5.64 cm MV E velocity: 148.00 cm/s MV A velocity: 64.50 cm/s MV E/A ratio:  2.29 Sammy Crisp MD Electronically signed by Sammy Crisp MD Signature Date/Time: 03/11/2024/12:39:38 PM    Final    DG Chest Port 1 View Result Date: 03/09/2024 CLINICAL DATA:  Shortness of breath EXAM: PORTABLE CHEST 1 VIEW COMPARISON:  03/05/2024 FINDINGS: Unchanged position of dual lumen catheter with tip in the SVC. The tips are separated with 1 oriented slightly superiorly. Unchanged cardiomegaly. Worsening right greater than left bibasilar atelectasis. Small left pleural effusion. IMPRESSION: 1. Worsening right greater than left bibasilar atelectasis. 2. Small left pleural effusion. Electronically Signed   By: Juanetta Nordmann M.D.   On: 03/09/2024 19:42     Medications:     allopurinol   100 mg Oral Daily   amLODipine   5 mg Oral Daily   apixaban   2.5 mg Oral BID   Chlorhexidine  Gluconate Cloth  6 each Topical Q0600   epoetin  alfa-epbx (RETACRIT ) injection  4,000 Units Intravenous Q M,W,F-1800   fluticasone  furoate-vilanterol  1 puff Inhalation Daily   furosemide   40 mg Intravenous Q12H   guaiFENesin   600 mg Oral BID   hydrALAZINE   100 mg Oral TID   insulin  aspart  0-5 Units Subcutaneous QHS   insulin  aspart  0-6 Units Subcutaneous TID WC   ipratropium-albuterol   3 mL Nebulization Q6H   levothyroxine   75 mcg Oral Daily   metoprolol  tartrate  25 mg Oral BID   montelukast   10 mg Oral QHS   predniSONE   40 mg Oral Q breakfast   rosuvastatin   40 mg Oral Daily   sertraline   100 mg Oral Daily   acetaminophen  **OR** acetaminophen , albuterol , alteplase , heparin , ondansetron  **OR** ondansetron  (ZOFRAN ) IV  Assessment/ Plan:  Ms. GRENDA LORA is a 83 y.o.  female with past medical conditions including diastolic heart failure, hypertension, hypothyroidism, COPD, and end  stage renal disease on hemodialysis.  She presents to the emergency department complaining of shortness of breath and has been admitted for SOB (shortness of breath) [R06.02] ESRD (end stage renal disease) on dialysis (HCC) [N18.6, Z99.2] Acute dyspnea [R06.00] Exacerbation of asthma, unspecified asthma severity, unspecified whether persistent [J45.901]   End-stage renal disease on hemodialysis.  Patient received dialysis yesterday, UF 2 L achieved.  Next treatment scheduled for Wednesday.  2.  Acute respiratory failure, patient on 2 L nasal cannula.  Chest x-ray shows bilateral atelectasis, right greater than left along with small pleural effusion.  Patient remains on 2 L nasal cannula however normal breathing effort has been achieved.  3. Anemia of chronic kidney disease Lab Results  Component Value Date   HGB 8.4 (L) 03/10/2024    Hemoglobin below optimal range.  Continue Retacrit  4000 units with dialysis.  4. Secondary Hyperparathyroidism: with outpatient labs: PTH 12, phosphorus 6.5, calcium  10 on 12/13/2023.   Lab Results  Component Value Date   CALCIUM  8.8 (L) 03/11/2024   PHOS 2.4 (L) 12/29/2023    Calcium  at goal.  Will obtain updated phosphorus in a.m. with dialysis.  5.  Hypertension with chronic kidney disease Home regimen includes amlodipine , furosemide , hydralazine , and metoprolol .  All have been continued during this admission.  Blood pressure remained stable.    LOS: 1 Humbert Morozov 6/10/202512:59 PM

## 2024-03-11 NOTE — Plan of Care (Signed)
  Problem: Education: Goal: Ability to describe self-care measures that may prevent or decrease complications (Diabetes Survival Skills Education) will improve Outcome: Progressing Goal: Individualized Educational Video(s) Outcome: Progressing   Problem: Coping: Goal: Ability to adjust to condition or change in health will improve Outcome: Progressing   Problem: Fluid Volume: Goal: Ability to maintain a balanced intake and output will improve Outcome: Progressing   Problem: Health Behavior/Discharge Planning: Goal: Ability to identify and utilize available resources and services will improve Outcome: Progressing Goal: Ability to manage health-related needs will improve Outcome: Progressing   Problem: Metabolic: Goal: Ability to maintain appropriate glucose levels will improve Outcome: Progressing   Problem: Nutritional: Goal: Maintenance of adequate nutrition will improve Outcome: Progressing Goal: Progress toward achieving an optimal weight will improve Outcome: Progressing   Problem: Skin Integrity: Goal: Risk for impaired skin integrity will decrease Outcome: Progressing   Problem: Tissue Perfusion: Goal: Adequacy of tissue perfusion will improve Outcome: Progressing   Problem: Education: Goal: Ability to demonstrate management of disease process will improve Outcome: Progressing Goal: Ability to verbalize understanding of medication therapies will improve Outcome: Progressing Goal: Individualized Educational Video(s) Outcome: Progressing   Problem: Activity: Goal: Capacity to carry out activities will improve Outcome: Progressing   Problem: Cardiac: Goal: Ability to achieve and maintain adequate cardiopulmonary perfusion will improve Outcome: Progressing   Problem: Education: Goal: Knowledge of disease or condition will improve Outcome: Progressing Goal: Knowledge of the prescribed therapeutic regimen will improve Outcome: Progressing Goal:  Individualized Educational Video(s) Outcome: Progressing   Problem: Activity: Goal: Ability to tolerate increased activity will improve Outcome: Progressing Goal: Will verbalize the importance of balancing activity with adequate rest periods Outcome: Progressing   Problem: Respiratory: Goal: Ability to maintain a clear airway will improve Outcome: Progressing Goal: Levels of oxygenation will improve Outcome: Progressing Goal: Ability to maintain adequate ventilation will improve Outcome: Progressing   Problem: Education: Goal: Knowledge of General Education information will improve Description: Including pain rating scale, medication(s)/side effects and non-pharmacologic comfort measures Outcome: Progressing   Problem: Health Behavior/Discharge Planning: Goal: Ability to manage health-related needs will improve Outcome: Progressing   Problem: Clinical Measurements: Goal: Ability to maintain clinical measurements within normal limits will improve Outcome: Progressing Goal: Will remain free from infection Outcome: Progressing Goal: Diagnostic test results will improve Outcome: Progressing Goal: Respiratory complications will improve Outcome: Progressing Goal: Cardiovascular complication will be avoided Outcome: Progressing   Problem: Activity: Goal: Risk for activity intolerance will decrease Outcome: Progressing   Problem: Nutrition: Goal: Adequate nutrition will be maintained Outcome: Progressing   Problem: Coping: Goal: Level of anxiety will decrease Outcome: Progressing   Problem: Elimination: Goal: Will not experience complications related to bowel motility Outcome: Progressing Goal: Will not experience complications related to urinary retention Outcome: Progressing   Problem: Pain Managment: Goal: General experience of comfort will improve and/or be controlled Outcome: Progressing   Problem: Safety: Goal: Ability to remain free from injury will  improve Outcome: Progressing   Problem: Skin Integrity: Goal: Risk for impaired skin integrity will decrease Outcome: Progressing

## 2024-03-11 NOTE — Progress Notes (Signed)
*  PRELIMINARY RESULTS* Echocardiogram 2D Echocardiogram has been performed.  [name] 03/11/2024, 8:23 AM

## 2024-03-11 NOTE — Progress Notes (Signed)
 PROGRESS NOTE    [name]  ZOX:[id number] DOB: 1941/05/08 DOA: 03/09/2024 PCP: Ostwalt, Janna, PA-C     Brief Narrative:   From admission h and p  [name] is a 83 y.o. female with medical history significant for CKD 4, ESRD, initiated on dialysis a couple months ago, diastolic CHF, HTN, surgical hypothyroidism, gout, COPD, OSA on CPAP  paroxysmal atrial fibrillation on  Eliquis , being admitted with shortness of breath secondary to suspected fluid overload after presenting to the ED for the second time in 2 days.  On her first visit she was treated for COPD and prescribed a dose of steroids however she states she continues to have shortness of breath and feel poorly.  She has been compliant with her dialysis however states they have not been taking a lot of fluid off.  She denies chest pain, fever or chills. Past discharge summary  from 3/21 to 4/1 , admitted with acute respiratory failure of multifactorial etiology related to CAP/COPD/CHF exacerbation.  She required O2 up to 4 L but was weaned to room air.  She initiated dialysis during that hospitalization. In the ED BP 162/60 and tachypneic to 30 but maintaining O2 sats in the mid 90s on room air.  Labs notable for troponin 37 and BNP 2527 with normal WBC and negative respiratory viral panel.  VBG was unremarkable with venous pH of 7.5 and low pCO2 of 39.  Hemoglobin near baseline at 8.5, BNP in keeping with dialysis status with normal potassium and bicarb EKG showing sinus rhythm at 77 with no acute ST abnormalities and chest x-ray showed worsening right greater than left bibasilar atelectasis with a small left pleural effusion. Patient was treated with DuoNebs, methylprednisolone  and given a dose of IV Lasix  she was also empirically started on ceftriaxone  and doxycycline  for possible acute bronchitis.  Admission requested    Assessment & Plan:   Principal Problem:   Acute dyspnea Active Problems:   Acute on chronic diastolic  congestive heart failure (HCC)   COPD with acute exacerbation (HCC)   Paroxysmal atrial fibrillation (HCC)   ESRD on dialysis (HCC)   OSA (obstructive sleep apnea)   Essential hypertension   Type 2 diabetes mellitus with obesity (HCC)   Hypothyroidism   Anemia in chronic kidney disease  # HFpEF # pHTN, moderate # Mitral regurgitation, severe # Tricuspid regurgitation, mild/moderate With acute exacerbation. Volume mgmt with relatively new dialysis likely playing a role. Does make urine. Unlikely PE as on apixaban  though is on 2.5 bid not 5. Symptomatically improved. TTE with preserved EF, grade 3 dd, mod pHTN, severe mitral regurg (worsened from prior) - hemodialysis as below - lasix  40 IV bid for home lasix  40 oral daily - outpt cardiology f/u  # Debility Ambulated with nursing today, reports feeling more or less at baseline, so holding on formal PT eval  # COPD With possible mild exacerbation, does not appear to have cap - will continue steroids, duonebs - home breo, singulair  - holding on abx  # Acute hypoxic respiratory failure Requiring 2 liters, 2/2 above processes. Symptomatically improved, still on 2 liters, will ask nursing to perform O2 assessment today - [place] O2, wean as able  # ESRD Nephrology consulted for hemodialysis, received yesterday  # A-fib, paroxysmal Rate controlled - cont home metop, apixaban   # hypothyroid - home synthroid   # GAD - home sertraline   # OSA - cpap at bedtime  # T2DM Not on meds at home but will be  on steroids here - ssi  # HTN Bp elevated on admission, improved - continue home amlodipine , hydralazine , metoprolol   # Anemia of ckd Hgb in the 8s, stable - per nephrology  # Gout No exacerbation - home allopurinol     DVT prophylaxis: apixaban  Code Status: dnr/dni Family Communication: dauther updated @ bedside 6/10  Level of care: Progressive Status is: inpt    Consultants:   nephrology  Procedures: none  Antimicrobials:  S/p abx in the ER    Subjective: Reports dyspnea somewhat improved  Objective: Vitals:   03/11/24 0524 03/11/24 0824 03/11/24 1019 03/11/24 1230  BP:  (!) 135/52 (!) 135/52 (!) 145/53  Pulse:  69 69 62  Resp:  16  15  Temp:  97.7 F (36.5 C)  98.2 F (36.8 C)  TempSrc:  Oral  Oral  SpO2:  97%  99%  Weight: 72.3 kg     Height:        Intake/Output Summary (Last 24 hours) at 03/11/2024 1336 Last data filed at 03/11/2024 1036 Gross per 24 hour  Intake 360 ml  Output 2600 ml  Net -2240 ml   Filed Weights   03/10/24 2135 03/11/24 0524  Weight: 72.3 kg 72.3 kg    Examination:  General exam: Appears calm and comfortable  Respiratory system: rales at bases otherwise clear Cardiovascular system: S1 & S2 heard, RRR.   Gastrointestinal system: Abdomen is nondistended, soft and nontender.   Central nervous system: Alert and oriented. No focal neurological deficits. Extremities: Symmetric 5 x 5 power. Lower extremity edema improved Skin: No rashes, lesions or ulcers Psychiatry: Judgement and insight appear normal. Mood & affect appropriate.     Data Reviewed: I have personally reviewed following labs and imaging studies  CBC: Recent Labs  Lab 03/05/24 1747 03/09/24 1857 03/10/24 0537  WBC 5.8 8.6 8.2  HGB 8.0* 8.5* 8.4*  HCT 24.8* 26.1* 25.8*  MCV 92.2 92.2 93.1  PLT 136* 193 196   Basic Metabolic Panel: Recent Labs  Lab 03/05/24 1747 03/09/24 1857 03/10/24 0537 03/11/24 0600  NA 137 135 135 136  K 2.9* 3.8 3.6 3.7  CL 94* 99 102 98  CO2 29 23 23 28   GLUCOSE 173* 133* 150* 145*  BUN 14 75* 76* 55*  CREATININE 1.33* 3.21* 3.01* 2.38*  CALCIUM  8.4* 9.0 8.9 8.8*   GFR: Estimated Creatinine Clearance: 16.7 mL/min (A) (by C-G formula based on SCr of 2.38 mg/dL (H)). Liver Function Tests: No results for input(s): "AST", "ALT", "ALKPHOS", "BILITOT", "PROT", "ALBUMIN" in the last 168 hours. No results for  input(s): "LIPASE", "AMYLASE" in the last 168 hours. No results for input(s): "AMMONIA" in the last 168 hours. Coagulation Profile: No results for input(s): "INR", "PROTIME" in the last 168 hours. Cardiac Enzymes: No results for input(s): "CKTOTAL", "CKMB", "CKMBINDEX", "TROPONINI" in the last 168 hours. BNP (last 3 results) No results for input(s): "PROBNP" in the last 8760 hours. HbA1C: No results for input(s): "HGBA1C" in the last 72 hours. CBG: Recent Labs  Lab 03/10/24 1201 03/10/24 1658 03/10/24 2225 03/11/24 0828 03/11/24 1232  GLUCAP 112* 144* 116* 110* 71   Lipid Profile: No results for input(s): "CHOL", "HDL", "LDLCALC", "TRIG", "CHOLHDL", "LDLDIRECT" in the last 72 hours. Thyroid  Function Tests: No results for input(s): "TSH", "T4TOTAL", "FREET4", "T3FREE", "THYROIDAB" in the last 72 hours. Anemia Panel: No results for input(s): "VITAMINB12", "FOLATE", "FERRITIN", "TIBC", "IRON", "RETICCTPCT" in the last 72 hours. Urine analysis:    Component Value Date/Time   COLORURINE YELLOW (  A) 09/24/2023 1050   APPEARANCEUR CLOUDY (A) 09/24/2023 1050   APPEARANCEUR Clear 09/17/2018 1028   LABSPEC 1.012 09/24/2023 1050   LABSPEC 1.013 06/20/2012 1508   PHURINE 5.0 09/24/2023 1050   GLUCOSEU NEGATIVE 09/24/2023 1050   GLUCOSEU Negative 06/20/2012 1508   HGBUR SMALL (A) 09/24/2023 1050   BILIRUBINUR NEGATIVE 09/24/2023 1050   BILIRUBINUR Negative 09/17/2018 1028   BILIRUBINUR Negative 06/20/2012 1508   KETONESUR NEGATIVE 09/24/2023 1050   PROTEINUR 100 (A) 09/24/2023 1050   NITRITE NEGATIVE 09/24/2023 1050   LEUKOCYTESUR LARGE (A) 09/24/2023 1050   LEUKOCYTESUR Negative 06/20/2012 1508   Sepsis Labs: @LABRCNTIP (procalcitonin:4,lacticidven:4)  ) Recent Results (from the past 240 hours)  Resp panel by RT-PCR (RSV, Flu A&B, Covid) Anterior Nasal Swab     Status: None   Collection Time: 03/05/24  5:47 PM   Specimen: Anterior Nasal Swab  Result Value Ref Range Status    SARS Coronavirus 2 by RT PCR NEGATIVE NEGATIVE Final    Comment: (NOTE) SARS-CoV-2 target nucleic acids are NOT DETECTED.  The SARS-CoV-2 RNA is generally detectable in upper respiratory specimens during the acute phase of infection. The lowest concentration of SARS-CoV-2 viral copies this assay can detect is 138 copies/mL. A negative result does not preclude SARS-Cov-2 infection and should not be used as the sole basis for treatment or other patient management decisions. A negative result may occur with  improper specimen collection/handling, submission of specimen other than nasopharyngeal swab, presence of viral mutation(s) within the areas targeted by this assay, and inadequate number of viral copies(<138 copies/mL). A negative result must be combined with clinical observations, patient history, and epidemiological information. The expected result is Negative.  Fact Sheet for Patients:  BloggerCourse.com  Fact Sheet for Healthcare Providers:  SeriousBroker.it  This test is no t yet approved or cleared by the [place]  FDA and  has been authorized for detection and/or diagnosis of SARS-CoV-2 by FDA under an Emergency Use Authorization (EUA). This EUA will remain  in effect (meaning this test can be used) for the duration of the COVID-19 declaration under Section 564(b)(1) of the Act, 21 U.S.C.section 360bbb-3(b)(1), unless the authorization is terminated  or revoked sooner.       Influenza A by PCR NEGATIVE NEGATIVE Final   Influenza B by PCR NEGATIVE NEGATIVE Final    Comment: (NOTE) The Xpert Xpress SARS-CoV-2/FLU/RSV plus assay is intended as an aid in the diagnosis of influenza from Nasopharyngeal swab specimens and should not be used as a sole basis for treatment. Nasal washings and aspirates are unacceptable for Xpert Xpress SARS-CoV-2/FLU/RSV testing.  Fact Sheet for  Patients: BloggerCourse.com  Fact Sheet for Healthcare Providers: SeriousBroker.it  This test is not yet approved or cleared by the [place]  FDA and has been authorized for detection and/or diagnosis of SARS-CoV-2 by FDA under an Emergency Use Authorization (EUA). This EUA will remain in effect (meaning this test can be used) for the duration of the COVID-19 declaration under Section 564(b)(1) of the Act, 21 U.S.C. section 360bbb-3(b)(1), unless the authorization is terminated or revoked.     Resp Syncytial Virus by PCR NEGATIVE NEGATIVE Final    Comment: (NOTE) Fact Sheet for Patients: BloggerCourse.com  Fact Sheet for Healthcare Providers: SeriousBroker.it  This test is not yet approved or cleared by the [place]  FDA and has been authorized for detection and/or diagnosis of SARS-CoV-2 by FDA under an Emergency Use Authorization (EUA). This EUA will remain in effect (meaning this test can be used) for  the duration of the COVID-19 declaration under Section 564(b)(1) of the Act, 21 U.S.C. section 360bbb-3(b)(1), unless the authorization is terminated or revoked.  Performed at [facility], [address] [place], [place] [address]   Resp panel by RT-PCR (RSV, Flu A&B, Covid) Anterior Nasal Swab     Status: None   Collection Time: 03/09/24  7:18 PM   Specimen: Anterior Nasal Swab  Result Value Ref Range Status   SARS Coronavirus 2 by RT PCR NEGATIVE NEGATIVE Final    Comment: (NOTE) SARS-CoV-2 target nucleic acids are NOT DETECTED.  The SARS-CoV-2 RNA is generally detectable in upper respiratory specimens during the acute phase of infection. The lowest concentration of SARS-CoV-2 viral copies this assay can detect is 138 copies/mL. A negative result does not preclude SARS-Cov-2 infection and should not be used as the sole basis for treatment or other  patient management decisions. A negative result may occur with  improper specimen collection/handling, submission of specimen other than nasopharyngeal swab, presence of viral mutation(s) within the areas targeted by this assay, and inadequate number of viral copies(<138 copies/mL). A negative result must be combined with clinical observations, patient history, and epidemiological information. The expected result is Negative.  Fact Sheet for Patients:  BloggerCourse.com  Fact Sheet for Healthcare Providers:  SeriousBroker.it  This test is no t yet approved or cleared by the [place]  FDA and  has been authorized for detection and/or diagnosis of SARS-CoV-2 by FDA under an Emergency Use Authorization (EUA). This EUA will remain  in effect (meaning this test can be used) for the duration of the COVID-19 declaration under Section 564(b)(1) of the Act, 21 U.S.C.section 360bbb-3(b)(1), unless the authorization is terminated  or revoked sooner.       Influenza A by PCR NEGATIVE NEGATIVE Final   Influenza B by PCR NEGATIVE NEGATIVE Final    Comment: (NOTE) The Xpert Xpress SARS-CoV-2/FLU/RSV plus assay is intended as an aid in the diagnosis of influenza from Nasopharyngeal swab specimens and should not be used as a sole basis for treatment. Nasal washings and aspirates are unacceptable for Xpert Xpress SARS-CoV-2/FLU/RSV testing.  Fact Sheet for Patients: BloggerCourse.com  Fact Sheet for Healthcare Providers: SeriousBroker.it  This test is not yet approved or cleared by the [place]  FDA and has been authorized for detection and/or diagnosis of SARS-CoV-2 by FDA under an Emergency Use Authorization (EUA). This EUA will remain in effect (meaning this test can be used) for the duration of the COVID-19 declaration under Section 564(b)(1) of the Act, 21 U.S.C. section  360bbb-3(b)(1), unless the authorization is terminated or revoked.     Resp Syncytial Virus by PCR NEGATIVE NEGATIVE Final    Comment: (NOTE) Fact Sheet for Patients: BloggerCourse.com  Fact Sheet for Healthcare Providers: SeriousBroker.it  This test is not yet approved or cleared by the [place]  FDA and has been authorized for detection and/or diagnosis of SARS-CoV-2 by FDA under an Emergency Use Authorization (EUA). This EUA will remain in effect (meaning this test can be used) for the duration of the COVID-19 declaration under Section 564(b)(1) of the Act, 21 U.S.C. section 360bbb-3(b)(1), unless the authorization is terminated or revoked.  Performed at [facility], [address] [place], [place] [address]   Respiratory (~20 pathogens) panel by PCR     Status: None   Collection Time: 03/09/24 10:00 PM   Specimen: Nasopharyngeal Swab; Respiratory  Result Value Ref Range Status   Adenovirus NOT DETECTED NOT DETECTED Final   Coronavirus  229E NOT DETECTED NOT DETECTED Final    Comment: (NOTE) The Coronavirus on the Respiratory Panel, DOES NOT test for the novel  Coronavirus (2019 nCoV)    Coronavirus HKU1 NOT DETECTED NOT DETECTED Final   Coronavirus NL63 NOT DETECTED NOT DETECTED Final   Coronavirus OC43 NOT DETECTED NOT DETECTED Final   Metapneumovirus NOT DETECTED NOT DETECTED Final   Rhinovirus / Enterovirus NOT DETECTED NOT DETECTED Final   Influenza A NOT DETECTED NOT DETECTED Final   Influenza B NOT DETECTED NOT DETECTED Final   Parainfluenza Virus 1 NOT DETECTED NOT DETECTED Final   Parainfluenza Virus 2 NOT DETECTED NOT DETECTED Final   Parainfluenza Virus 3 NOT DETECTED NOT DETECTED Final   Parainfluenza Virus 4 NOT DETECTED NOT DETECTED Final   Respiratory Syncytial Virus NOT DETECTED NOT DETECTED Final   Bordetella pertussis NOT DETECTED NOT DETECTED Final   Bordetella Parapertussis NOT  DETECTED NOT DETECTED Final   Chlamydophila pneumoniae NOT DETECTED NOT DETECTED Final   Mycoplasma pneumoniae NOT DETECTED NOT DETECTED Final    Comment: Performed at [facility], [address], [place], [place] [address]         Radiology Studies: ECHOCARDIOGRAM COMPLETE Result Date: 03/11/2024    ECHOCARDIOGRAM REPORT   Patient Name:   [name] [facility] Date of Exam: 03/11/2024 Medical Rec #:  [id number]     Height:       62.0 in Accession #:    [id number]    Weight:       159.4 lb Date of Birth:  Nov 18, 1940     BSA:          1.736 m Patient Age:    83 years      BP:           142/51 mmHg Patient Gender: F             HR:           65 bpm. Exam Location:  [facility] Procedure: 2D Echo, Color Doppler and Cardiac Doppler (Both Spectral and Color            Flow Doppler were utilized during procedure). Indications:     Dyspnea R06.00  History:         Patient has prior history of Echocardiogram examinations, most                  recent 04/19/2023. Risk Factors:Hypertension and Dyslipidemia.  Sonographer:     Broadus Canes Referring Phys:  [id number] [facility] [name] Diagnosing Phys: Sammy Crisp MD IMPRESSIONS  1. Left ventricular ejection fraction, by estimation, is 55 to 60%. The left ventricle has normal function. The left ventricle has no regional wall motion abnormalities. The left ventricular internal cavity size was severely dilated. There is mild left ventricular hypertrophy. Left ventricular diastolic parameters are consistent with Grade III diastolic dysfunction (restrictive). Elevated left atrial pressure.  2. Right ventricular systolic function is normal. The right ventricular size is normal. There is moderately elevated pulmonary artery systolic pressure.  3. Left atrial size was severely dilated.  4. The mitral valve is abnormal. Severe mitral valve regurgitation. No evidence of mitral stenosis.  5. Tricuspid valve regurgitation is mild to moderate.  6. The aortic valve was not well visualized.  Aortic valve regurgitation is not visualized. No aortic stenosis is present.  7. The inferior vena cava is normal in size with greater than 50% respiratory variability, suggesting right atrial pressure of 3 mmHg. Comparison(s): A prior  study was performed on 04/19/2023. Changes from prior study are noted. Mitral regurgitation is significantly worse on today's study. FINDINGS  Left Ventricle: Left ventricular ejection fraction, by estimation, is 55 to 60%. The left ventricle has normal function. The left ventricle has no regional wall motion abnormalities. The left ventricular internal cavity size was severely dilated. There is mild left ventricular hypertrophy. Left ventricular diastolic parameters are consistent with Grade III diastolic dysfunction (restrictive). Elevated left atrial pressure. Right Ventricle: The right ventricular size is normal. No increase in right ventricular wall thickness. Right ventricular systolic function is normal. There is moderately elevated pulmonary artery systolic pressure. The tricuspid regurgitant velocity is 3.71 m/s, and with an assumed right atrial pressure of 3 mmHg, the estimated right ventricular systolic pressure is 58.1 mmHg. Left Atrium: Left atrial size was severely dilated. Right Atrium: Right atrial size was normal in size. Pericardium: Trivial pericardial effusion is present. Mitral Valve: The mitral valve is abnormal. Mild mitral annular calcification. Severe mitral valve regurgitation, with posteriorly-directed jet. No evidence of mitral valve stenosis. MV peak gradient, 12.2 mmHg. The mean mitral valve gradient is 5.0 mmHg. Tricuspid Valve: The tricuspid valve is normal in structure. Tricuspid valve regurgitation is mild to moderate. Aortic Valve: The aortic valve was not well visualized. Aortic valve regurgitation is not visualized. No aortic stenosis is present. Aortic valve mean gradient measures 6.0 mmHg. Aortic valve peak gradient measures 11.2 mmHg. Aortic  valve area, by VTI measures 2.34 cm. Pulmonic Valve: The pulmonic valve was not well visualized. Pulmonic valve regurgitation is not visualized. No evidence of pulmonic stenosis. Aorta: The aortic root is normal in size and structure. Pulmonary Artery: The pulmonary artery is not well seen. Venous: The inferior vena cava is normal in size with greater than 50% respiratory variability, suggesting right atrial pressure of 3 mmHg. IAS/Shunts: The interatrial septum was not well visualized.  LEFT VENTRICLE PLAX 2D LVIDd:         6.20 cm   Diastology LVIDs:         4.10 cm   LV e' medial:    5.00 cm/s LV PW:         1.30 cm   LV E/e' medial:  29.6 LV IVS:        1.21 cm   LV e' lateral:   7.94 cm/s LVOT diam:     2.00 cm   LV E/e' lateral: 18.6 LV SV:         97 LV SV Index:   56 LVOT Area:     3.14 cm  RIGHT VENTRICLE RV Basal diam:  3.40 cm RV Mid diam:    3.30 cm RV S prime:     12.90 cm/s TAPSE (M-mode): 2.1 cm LEFT ATRIUM              Index        RIGHT ATRIUM          Index LA diam:        5.50 cm  3.17 cm/m   RA Area:     8.14 cm LA Vol (A2C):   124.0 ml 71.43 ml/m  RA Volume:   13.30 ml 7.66 ml/m LA Vol (A4C):   170.0 ml 97.93 ml/m LA Biplane Vol: 156.0 ml 89.87 ml/m  AORTIC VALVE AV Area (Vmax):    2.31 cm AV Area (Vmean):   2.29 cm AV Area (VTI):     2.34 cm AV Vmax:  167.00 cm/s AV Vmean:          116.000 cm/s AV VTI:            0.416 m AV Peak Grad:      11.2 mmHg AV Mean Grad:      6.0 mmHg LVOT Vmax:         123.00 cm/s LVOT Vmean:        84.600 cm/s LVOT VTI:          0.310 m LVOT/AV VTI ratio: 0.75  AORTA Ao Root diam: 3.00 cm MITRAL VALVE                TRICUSPID VALVE MV Area (PHT): 5.54 cm     TR Peak grad:   55.1 mmHg MV Area VTI:   2.17 cm     TR Vmax:        371.00 cm/s MV Peak grad:  12.2 mmHg MV Mean grad:  5.0 mmHg     SHUNTS MV Vmax:       1.75 m/s     Systemic VTI:  0.31 m MV Vmean:      101.0 cm/s   Systemic Diam: 2.00 cm MV Decel Time: 137 msec MR Peak grad: 99.2 mmHg MR  Mean grad: 68.0 mmHg MR Vmax:      498.00 cm/s MR Vmean:     389.0 cm/s MR PISA:      5.64 cm MV E velocity: 148.00 cm/s MV A velocity: 64.50 cm/s MV E/A ratio:  2.29 Veryl Gottron End MD Electronically signed by Sammy Crisp MD Signature Date/Time: 03/11/2024/12:39:38 PM    Final    DG Chest Port 1 View Result Date: 03/09/2024 CLINICAL DATA:  Shortness of breath EXAM: PORTABLE CHEST 1 VIEW COMPARISON:  03/05/2024 FINDINGS: Unchanged position of dual lumen catheter with tip in the SVC. The tips are separated with 1 oriented slightly superiorly. Unchanged cardiomegaly. Worsening right greater than left bibasilar atelectasis. Small left pleural effusion. IMPRESSION: 1. Worsening right greater than left bibasilar atelectasis. 2. Small left pleural effusion. Electronically Signed   By: Juanetta Nordmann M.D.   On: 03/09/2024 19:42        Scheduled Meds:  allopurinol   100 mg Oral Daily   amLODipine   5 mg Oral Daily   apixaban   2.5 mg Oral BID   Chlorhexidine  Gluconate Cloth  6 each Topical Q0600   epoetin  alfa-epbx (RETACRIT ) injection  4,000 Units Intravenous Q M,W,F-1800   fluticasone  furoate-vilanterol  1 puff Inhalation Daily   furosemide   40 mg Intravenous Q12H   guaiFENesin   600 mg Oral BID   hydrALAZINE   100 mg Oral TID   insulin  aspart  0-5 Units Subcutaneous QHS   insulin  aspart  0-6 Units Subcutaneous TID WC   ipratropium-albuterol   3 mL Nebulization Q6H   levothyroxine   75 mcg Oral Daily   metoprolol  tartrate  25 mg Oral BID   montelukast   10 mg Oral QHS   predniSONE   40 mg Oral Q breakfast   rosuvastatin   40 mg Oral Daily   sertraline   100 mg Oral Daily   Continuous Infusions:   LOS: 1 day     Raymonde Calico, MD [facility]   If 7PM-7AM, please contact night-coverage [email or web address] Password [email or web address] 03/11/2024, 1:36 PM

## 2024-03-11 NOTE — Progress Notes (Signed)
 O2 sats 96% on room air at rest prior to ambulation. O2 sats 89% while ambulating. O2 sats 93% on room at rest after ambulation.

## 2024-03-11 NOTE — Progress Notes (Signed)
*  PRELIMINARY RESULTS* Echocardiogram 2D Echocardiogram has been performed.  Broadus Canes 03/11/2024, 8:23 AM

## 2024-03-11 NOTE — Progress Notes (Signed)
 Heart Failure Navigator Progress Note  Assessed for Heart & Vascular TOC clinic readiness.  Unfortunately patient does not meet criteria due to ESRD on hemodialysis.   Navigator will sign off at this time.  Celedonio Coil, RN, BSN [facility] Heart Failure Navigator Secure Chat Only

## 2024-03-11 NOTE — Plan of Care (Signed)
  Problem: Coping: Goal: Ability to adjust to condition or change in health will improve Outcome: Progressing   Problem: Fluid Volume: Goal: Ability to maintain a balanced intake and output will improve Outcome: Progressing   Problem: Skin Integrity: Goal: Risk for impaired skin integrity will decrease Outcome: Progressing   Problem: Education: Goal: Ability to demonstrate management of disease process will improve Outcome: Progressing Goal: Ability to verbalize understanding of medication therapies will improve Outcome: Progressing Goal: Individualized Educational Video(s) Outcome: Progressing   Problem: Education: Goal: Knowledge of disease or condition will improve Outcome: Progressing Goal: Knowledge of the prescribed therapeutic regimen will improve Outcome: Progressing Goal: Individualized Educational Video(s) Outcome: Progressing   Problem: Elimination: Goal: Will not experience complications related to bowel motility Outcome: Progressing Goal: Will not experience complications related to urinary retention Outcome: Progressing   Problem: Pain Managment: Goal: General experience of comfort will improve and/or be controlled Outcome: Progressing

## 2024-03-12 ENCOUNTER — Telehealth: Payer: Self-pay | Admitting: Physician Assistant

## 2024-03-12 DIAGNOSIS — R0600 Dyspnea, unspecified: Secondary | ICD-10-CM | POA: Diagnosis not present

## 2024-03-12 LAB — BASIC METABOLIC PANEL WITH GFR
Anion gap: 12 (ref 5–15)
BUN: 82 mg/dL — ABNORMAL HIGH (ref 8–23)
CO2: 26 mmol/L (ref 22–32)
Calcium: 8.7 mg/dL — ABNORMAL LOW (ref 8.9–10.3)
Chloride: 98 mmol/L (ref 98–111)
Creatinine, Ser: 2.95 mg/dL — ABNORMAL HIGH (ref 0.44–1.00)
GFR, Estimated: 15 mL/min — ABNORMAL LOW (ref >60)
Glucose, Bld: 102 mg/dL — ABNORMAL HIGH (ref 70–99)
Potassium: 3.3 mmol/L — ABNORMAL LOW (ref 3.5–5.1)
Sodium: 136 mmol/L (ref 135–145)

## 2024-03-12 LAB — GLUCOSE, CAPILLARY
Glucose-Capillary: 98 mg/dL (ref 70–99)
Glucose-Capillary: 127 mg/dL — ABNORMAL HIGH (ref 70–99)
Glucose-Capillary: 148 mg/dL — ABNORMAL HIGH (ref 70–99)

## 2024-03-12 LAB — PHOSPHORUS: Phosphorus: 3.5 mg/dL (ref 2.5–4.6)

## 2024-03-12 MED ORDER — PREDNISONE 20 MG PO TABS: 40.0000 mg | ORAL_TABLET | Freq: Every day | ORAL | 0 refills | Status: AC

## 2024-03-12 NOTE — Telephone Encounter (Signed)
 [facility] sent via fax certification paperwork to be completed. Placed in provider mailbox can take 7-10 business days

## 2024-03-12 NOTE — Plan of Care (Signed)
   Problem: Education: Goal: Ability to describe self-care measures that may prevent or decrease complications (Diabetes Survival Skills Education) will improve Outcome: Progressing   Problem: Education: Goal: Individualized Educational Video(s) Outcome: Progressing   Problem: Coping: Goal: Ability to adjust to condition or change in health will improve Outcome: Progressing

## 2024-03-12 NOTE — Progress Notes (Signed)
 O2 sat remains 97 % room air during ambulation in hallway , denies SOB

## 2024-03-12 NOTE — Discharge Summary (Signed)
 Physician Discharge Summary  Robin Petrakis Notarianni PXT:[id number] DOB: 09-20-1941 DOA: 03/09/2024  PCP: Ostwalt, Janna, PA-C  Admit date: 03/09/2024 Discharge date: 03/12/2024  Admitted From: Home Disposition:  Home  Recommendations for Outpatient Follow-up:  Follow up with PCP in 1-2 weeks Follow up with cardiology as directed Follow up with nephrology as directed  Home Health:No  Equipment/Devices:Non   Discharge Condition:Stable  CODE STATUS:DNR- limited  Diet recommendation: Fluid restrict  Brief/Interim Summary:  From admission h and p   [name] is a 83 y.o. female with medical history significant for CKD 4, ESRD, initiated on dialysis a couple months ago, diastolic CHF, HTN, surgical hypothyroidism, gout, COPD, OSA on CPAP  paroxysmal atrial fibrillation on  Eliquis , being admitted with shortness of breath secondary to suspected fluid overload after presenting to the ED for the second time in 2 days.  On her first visit she was treated for COPD and prescribed a dose of steroids however she states she continues to have shortness of breath and feel poorly.  She has been compliant with her dialysis however states they have not been taking a lot of fluid off.  She denies chest pain, fever or chills. Past discharge summary  from 3/21 to 4/1 , admitted with acute respiratory failure of multifactorial etiology related to CAP/COPD/CHF exacerbation.  She required O2 up to 4 L but was weaned to room air.  She initiated dialysis during that hospitalization. In the ED BP 162/60 and tachypneic to 30 but maintaining O2 sats in the mid 90s on room air.  Labs notable for troponin 37 and BNP 2527 with normal WBC and negative respiratory viral panel.  VBG was unremarkable with venous pH of 7.5 and low pCO2 of 39.  Hemoglobin near baseline at 8.5, BNP in keeping with dialysis status with normal potassium and bicarb EKG showing sinus rhythm at 77 with no acute ST abnormalities and chest x-ray showed worsening  right greater than left bibasilar atelectasis with a small left pleural effusion. Patient was treated with DuoNebs, methylprednisolone  and given a dose of IV Lasix  she was also empirically started on ceftriaxone  and doxycycline  for possible acute bronchitis.  Admission requested     Discharge Diagnoses:  Principal Problem:   Acute dyspnea Active Problems:   Acute on chronic diastolic congestive heart failure (HCC)   COPD with acute exacerbation (HCC)   Paroxysmal atrial fibrillation (HCC)   ESRD on dialysis (HCC)   OSA (obstructive sleep apnea)   Essential hypertension   Type 2 diabetes mellitus with obesity (HCC)   Hypothyroidism   Anemia in chronic kidney disease  # HFpEF # pHTN, moderate # Mitral regurgitation, severe # Tricuspid regurgitation, mild/moderate With acute exacerbation. Volume mgmt with relatively new dialysis likely playing a role. Does make urine. Unlikely PE as on apixaban  though is on 2.5 bid not 5. Symptomatically improved. TTE with preserved EF, grade 3 dd, mod pHTN, severe mitral regurg (worsened from prior) Plan: Successfully dialyzed in hospital.  Was on Lasix  40 mg IV twice daily.  Will transition back to oral Lasix  on discharge.  Outpatient cardiology follow-up.  # Debility Feels at baseline at time of discharge.  Declined PT evaluation.   # COPD With possible mild exacerbation, does not appear to have cap Resume home bronchodilator regimen.  Short course of steroids prescribed.   # Acute hypoxic respiratory failure Resolved.  On room air at rest and with ambulation on discharge   # ESRD Nephrology following.  Outpatient HD chair established  Discharge Instructions  Discharge Instructions     Diet - low sodium heart healthy   Complete by: As directed    Increase activity slowly   Complete by: As directed    No wound care   Complete by: As directed       Allergies as of 03/12/2024       Reactions   Ace Inhibitors    Other  reaction(s): Unknown   Egg-derived Products Diarrhea   Other    Other reaction(s): Other (See Comments) Eggs   Prednisone     Other reaction(s): Other (See Comments) joint pain   Risedronate    Other reaction(s): Other (See Comments)   Sulfa Antibiotics Itching, Swelling   Other reaction(s): Other (See Comments)   Sulfasalazine Other (See Comments)        Medication List     STOP taking these medications    ipratropium 0.06 % nasal spray Commonly known as: ATROVENT        TAKE these medications    acetaminophen  500 MG tablet Commonly known as: TYLENOL  Take 1,000 mg by mouth every 6 (six) hours as needed for mild pain or moderate pain.   albuterol  108 (90 Base) MCG/ACT inhaler Commonly known as: VENTOLIN  HFA Inhale 2 puffs into the lungs every 6 (six) hours as needed for wheezing or shortness of breath.   allopurinol  100 MG tablet Commonly known as: ZYLOPRIM  Take 100 mg by mouth daily.   amLODipine  5 MG tablet Commonly known as: NORVASC  Take 1 tablet (5 mg total) by mouth daily.   apixaban  2.5 MG Tabs tablet Commonly known as: ELIQUIS  Take 1 tablet (2.5 mg total) by mouth 2 (two) times daily.   azelastine  0.1 % nasal spray Commonly known as: ASTELIN  Place 2 sprays into both nostrils 2 (two) times daily. Use in each nostril as directed   CALCIUM  & VIT D3 BONE HEALTH PO Take 1 tablet by mouth daily. 600 mg/ 25 mg   doxycycline  100 MG capsule Commonly known as: VIBRAMYCIN  Take 1 capsule (100 mg total) by mouth 2 (two) times daily for 10 days.   epoetin  alfa-epbx 4000 UNIT/ML injection Commonly known as: RETACRIT  Inject 1 mL (4,000 Units total) into the vein every Monday, Wednesday, and Friday at 6 PM.   esomeprazole  40 MG capsule Commonly known as: NEXIUM  Take 1 capsule (40 mg total) by mouth daily before breakfast.   ferrous sulfate 325 (65 FE) MG tablet Take 325 mg by mouth daily with breakfast.   fluticasone  50 MCG/ACT nasal spray Commonly known  as: FLONASE  Place 2 sprays into both nostrils daily.   fluticasone  furoate-vilanterol 200-25 MCG/ACT Aepb Commonly known as: Breo Ellipta  Inhale 1 puff into the lungs daily.   folic acid  1 MG tablet Commonly known as: FOLVITE  Take 1 tablet (1 mg total) by mouth daily.   furosemide  40 MG tablet Commonly known as: LASIX  TAKE ONE (1) TABLET BY MOUTH TWO TIMES PER DAY   guaiFENesin  600 MG 12 hr tablet Commonly known as: Mucinex  Take 1 tablet (600 mg total) by mouth 2 (two) times daily for 15 days.   hydrALAZINE  100 MG tablet Commonly known as: APRESOLINE  Take 1 tablet (100 mg total) by mouth 3 (three) times daily.   ipratropium-albuterol  0.5-2.5 (3) MG/3ML Soln Commonly known as: DUONEB Take 3 mLs by nebulization every 6 (six) hours as needed (SOB).   levothyroxine  75 MCG tablet Commonly known as: SYNTHROID  Take 75 mcg by mouth daily.   levothyroxine  150 MCG tablet Commonly known as:  SYNTHROID  Take 150 mcg by mouth every morning.   lidocaine -prilocaine  cream Commonly known as: EMLA  as directed. Before dialysis   loperamide  2 MG capsule Commonly known as: IMODIUM  Take 1 capsule (2 mg total) by mouth as needed for diarrhea or loose stools.   metoprolol  tartrate 50 MG tablet Commonly known as: LOPRESSOR  Take 25 mg by mouth 2 (two) times daily. What changed: Another medication with the same name was removed. Continue taking this medication, and follow the directions you see here.   montelukast  10 MG tablet Commonly known as: SINGULAIR  Take 1 tablet (10 mg total) by mouth at bedtime.   predniSONE  20 MG tablet Commonly known as: DELTASONE  Take 2 tablets (40 mg total) by mouth daily with breakfast for 2 days. Start taking on: March 13, 2024   rosuvastatin  40 MG tablet Commonly known as: CRESTOR  Take 40 mg by mouth daily.   senna-docusate 8.6-50 MG tablet Commonly known as: Senokot-S Take 2 tablets by mouth at bedtime as needed for mild constipation.   sertraline   100 MG tablet Commonly known as: ZOLOFT  TAKE 1 TABLET BY MOUTH DAILY   vitamin B-12 500 MCG tablet Commonly known as: CYANOCOBALAMIN  Take 500 mcg by mouth daily.        Allergies  Allergen Reactions   Ace Inhibitors     Other reaction(s): Unknown   Egg-Derived Products Diarrhea   Other     Other reaction(s): Other (See Comments) Eggs   Prednisone      Other reaction(s): Other (See Comments) joint pain   Risedronate     Other reaction(s): Other (See Comments)   Sulfa Antibiotics Itching and Swelling    Other reaction(s): Other (See Comments)   Sulfasalazine Other (See Comments)    Consultations: Nephrology   Procedures/Studies: ECHOCARDIOGRAM COMPLETE Result Date: 03/11/2024    ECHOCARDIOGRAM REPORT   Patient Name:   [name] [facility] Date of Exam: 03/11/2024 Medical Rec #:  [id number]     Height:       62.0 in Accession #:    [id number]    Weight:       159.4 lb Date of Birth:  Mar 27, 1941     BSA:          1.736 m Patient Age:    83 years      BP:           142/51 mmHg Patient Gender: F             HR:           65 bpm. Exam Location:  [facility] Procedure: 2D Echo, Color Doppler and Cardiac Doppler (Both Spectral and Color            Flow Doppler were utilized during procedure). Indications:     Dyspnea R06.00  History:         Patient has prior history of Echocardiogram examinations, most                  recent 04/19/2023. Risk Factors:Hypertension and Dyslipidemia.  Sonographer:     Broadus Canes Referring Phys:  [id number] [facility] [facility] Diagnosing Phys: Sammy Crisp MD IMPRESSIONS  1. Left ventricular ejection fraction, by estimation, is 55 to 60%. The left ventricle has normal function. The left ventricle has no regional wall motion abnormalities. The left ventricular internal cavity size was severely dilated. There is mild left ventricular hypertrophy. Left ventricular diastolic parameters are consistent with Grade III diastolic dysfunction (restrictive). Elevated left atrial pressure.   2. Right  ventricular systolic function is normal. The right ventricular size is normal. There is moderately elevated pulmonary artery systolic pressure.  3. Left atrial size was severely dilated.  4. The mitral valve is abnormal. Severe mitral valve regurgitation. No evidence of mitral stenosis.  5. Tricuspid valve regurgitation is mild to moderate.  6. The aortic valve was not well visualized. Aortic valve regurgitation is not visualized. No aortic stenosis is present.  7. The inferior vena cava is normal in size with greater than 50% respiratory variability, suggesting right atrial pressure of 3 mmHg. Comparison(s): A prior study was performed on 04/19/2023. Changes from prior study are noted. Mitral regurgitation is significantly worse on today's study. FINDINGS  Left Ventricle: Left ventricular ejection fraction, by estimation, is 55 to 60%. The left ventricle has normal function. The left ventricle has no regional wall motion abnormalities. The left ventricular internal cavity size was severely dilated. There is mild left ventricular hypertrophy. Left ventricular diastolic parameters are consistent with Grade III diastolic dysfunction (restrictive). Elevated left atrial pressure. Right Ventricle: The right ventricular size is normal. No increase in right ventricular wall thickness. Right ventricular systolic function is normal. There is moderately elevated pulmonary artery systolic pressure. The tricuspid regurgitant velocity is 3.71 m/s, and with an assumed right atrial pressure of 3 mmHg, the estimated right ventricular systolic pressure is 58.1 mmHg. Left Atrium: Left atrial size was severely dilated. Right Atrium: Right atrial size was normal in size. Pericardium: Trivial pericardial effusion is present. Mitral Valve: The mitral valve is abnormal. Mild mitral annular calcification. Severe mitral valve regurgitation, with posteriorly-directed jet. No evidence of mitral valve stenosis. MV peak gradient, 12.2  mmHg. The mean mitral valve gradient is 5.0 mmHg. Tricuspid Valve: The tricuspid valve is normal in structure. Tricuspid valve regurgitation is mild to moderate. Aortic Valve: The aortic valve was not well visualized. Aortic valve regurgitation is not visualized. No aortic stenosis is present. Aortic valve mean gradient measures 6.0 mmHg. Aortic valve peak gradient measures 11.2 mmHg. Aortic valve area, by VTI measures 2.34 cm. Pulmonic Valve: The pulmonic valve was not well visualized. Pulmonic valve regurgitation is not visualized. No evidence of pulmonic stenosis. Aorta: The aortic root is normal in size and structure. Pulmonary Artery: The pulmonary artery is not well seen. Venous: The inferior vena cava is normal in size with greater than 50% respiratory variability, suggesting right atrial pressure of 3 mmHg. IAS/Shunts: The interatrial septum was not well visualized.  LEFT VENTRICLE PLAX 2D LVIDd:         6.20 cm   Diastology LVIDs:         4.10 cm   LV e' medial:    5.00 cm/s LV PW:         1.30 cm   LV E/e' medial:  29.6 LV IVS:        1.21 cm   LV e' lateral:   7.94 cm/s LVOT diam:     2.00 cm   LV E/e' lateral: 18.6 LV SV:         97 LV SV Index:   56 LVOT Area:     3.14 cm  RIGHT VENTRICLE RV Basal diam:  3.40 cm RV Mid diam:    3.30 cm RV S prime:     12.90 cm/s TAPSE (M-mode): 2.1 cm LEFT ATRIUM              Index        RIGHT ATRIUM  Index LA diam:        5.50 cm  3.17 cm/m   RA Area:     8.14 cm LA Vol (A2C):   124.0 ml 71.43 ml/m  RA Volume:   13.30 ml 7.66 ml/m LA Vol (A4C):   170.0 ml 97.93 ml/m LA Biplane Vol: 156.0 ml 89.87 ml/m  AORTIC VALVE AV Area (Vmax):    2.31 cm AV Area (Vmean):   2.29 cm AV Area (VTI):     2.34 cm AV Vmax:           167.00 cm/s AV Vmean:          116.000 cm/s AV VTI:            0.416 m AV Peak Grad:      11.2 mmHg AV Mean Grad:      6.0 mmHg LVOT Vmax:         123.00 cm/s LVOT Vmean:        84.600 cm/s LVOT VTI:          0.310 m LVOT/AV VTI ratio: 0.75   AORTA Ao Root diam: 3.00 cm MITRAL VALVE                TRICUSPID VALVE MV Area (PHT): 5.54 cm     TR Peak grad:   55.1 mmHg MV Area VTI:   2.17 cm     TR Vmax:        371.00 cm/s MV Peak grad:  12.2 mmHg MV Mean grad:  5.0 mmHg     SHUNTS MV Vmax:       1.75 m/s     Systemic VTI:  0.31 m MV Vmean:      101.0 cm/s   Systemic Diam: 2.00 cm MV Decel Time: 137 msec MR Peak grad: 99.2 mmHg MR Mean grad: 68.0 mmHg MR Vmax:      498.00 cm/s MR Vmean:     389.0 cm/s MR PISA:      5.64 cm MV E velocity: 148.00 cm/s MV A velocity: 64.50 cm/s MV E/A ratio:  2.29 Veryl Gottron End MD Electronically signed by Sammy Crisp MD Signature Date/Time: 03/11/2024/12:39:38 PM    Final    DG Chest Port 1 View Result Date: 03/09/2024 CLINICAL DATA:  Shortness of breath EXAM: PORTABLE CHEST 1 VIEW COMPARISON:  03/05/2024 FINDINGS: Unchanged position of dual lumen catheter with tip in the SVC. The tips are separated with 1 oriented slightly superiorly. Unchanged cardiomegaly. Worsening right greater than left bibasilar atelectasis. Small left pleural effusion. IMPRESSION: 1. Worsening right greater than left bibasilar atelectasis. 2. Small left pleural effusion. Electronically Signed   By: Juanetta Nordmann M.D.   On: 03/09/2024 19:42   DG Chest Port 1 View Result Date: 03/05/2024 CLINICAL DATA:  Shortness of breath EXAM: PORTABLE CHEST 1 VIEW COMPARISON:  Chest x-ray 12/21/2023 FINDINGS: Cardiac silhouette is markedly enlarged, unchanged. There is some strandy opacities in the lung bases favored is atelectasis. There is no focal lung infiltrate, pleural effusion or pneumothorax. There is a new left-sided central venous catheter with distal tip ending in the SVC. No acute fractures are seen. IMPRESSION: 1. Markedly enlarged cardiac silhouette, unchanged. 2. Strandy opacities in the lung bases favored is atelectasis. Electronically Signed   By: Tyron Gallon M.D.   On: 03/05/2024 17:39      Subjective: Seen and examined the day of  discharge.  Discharge Exam: Vitals:   03/12/24 1230 03/12/24 1302  BP: (!) 129/43 (!) 128/42  Pulse: (!) 54 (!) 56  Resp: (!) 29 20  Temp:  98.6 F (37 C)  SpO2: 98% 97%   Vitals:   03/12/24 1200 03/12/24 1230 03/12/24 1302 03/12/24 1340  BP: (!) 128/49 (!) 129/43 (!) 128/42   Pulse: (!) 55 (!) 54 (!) 56   Resp: (!) 22 (!) 29 20   Temp:   98.6 F (37 C)   TempSrc:   Oral   SpO2: 97% 98% 97%   Weight:    71.5 kg  Height:        General: Pt is alert, awake, not in acute distress Cardiovascular: RRR, S1/S2 +, no rubs, no gallops Respiratory: CTA bilaterally, no wheezing, no rhonchi Abdominal: Soft, NT, ND, bowel sounds + Extremities: no edema, no cyanosis    The results of significant diagnostics from this hospitalization (including imaging, microbiology, ancillary and laboratory) are listed below for reference.     Microbiology: Recent Results (from the past 240 hours)  Resp panel by RT-PCR (RSV, Flu A&B, Covid) Anterior Nasal Swab     Status: None   Collection Time: 03/05/24  5:47 PM   Specimen: Anterior Nasal Swab  Result Value Ref Range Status   SARS Coronavirus 2 by RT PCR NEGATIVE NEGATIVE Final    Comment: (NOTE) SARS-CoV-2 target nucleic acids are NOT DETECTED.  The SARS-CoV-2 RNA is generally detectable in upper respiratory specimens during the acute phase of infection. The lowest concentration of SARS-CoV-2 viral copies this assay can detect is 138 copies/mL. A negative result does not preclude SARS-Cov-2 infection and should not be used as the sole basis for treatment or other patient management decisions. A negative result may occur with  improper specimen collection/handling, submission of specimen other than nasopharyngeal swab, presence of viral mutation(s) within the areas targeted by this assay, and inadequate number of viral copies(<138 copies/mL). A negative result must be combined with clinical observations, patient history, and  epidemiological information. The expected result is Negative.  Fact Sheet for Patients:  BloggerCourse.com  Fact Sheet for Healthcare Providers:  SeriousBroker.it  This test is no t yet approved or cleared by the [place]  FDA and  has been authorized for detection and/or diagnosis of SARS-CoV-2 by FDA under an Emergency Use Authorization (EUA). This EUA will remain  in effect (meaning this test can be used) for the duration of the COVID-19 declaration under Section 564(b)(1) of the Act, 21 U.S.C.section 360bbb-3(b)(1), unless the authorization is terminated  or revoked sooner.       Influenza A by PCR NEGATIVE NEGATIVE Final   Influenza B by PCR NEGATIVE NEGATIVE Final    Comment: (NOTE) The Xpert Xpress SARS-CoV-2/FLU/RSV plus assay is intended as an aid in the diagnosis of influenza from Nasopharyngeal swab specimens and should not be used as a sole basis for treatment. Nasal washings and aspirates are unacceptable for Xpert Xpress SARS-CoV-2/FLU/RSV testing.  Fact Sheet for Patients: BloggerCourse.com  Fact Sheet for Healthcare Providers: SeriousBroker.it  This test is not yet approved or cleared by the [place]  FDA and has been authorized for detection and/or diagnosis of SARS-CoV-2 by FDA under an Emergency Use Authorization (EUA). This EUA will remain in effect (meaning this test can be used) for the duration of the COVID-19 declaration under Section 564(b)(1) of the Act, 21 U.S.C. section 360bbb-3(b)(1), unless the authorization is terminated or revoked.     Resp Syncytial Virus by PCR NEGATIVE NEGATIVE Final    Comment: (NOTE) Fact Sheet for Patients: BloggerCourse.com  Fact Sheet  for Healthcare Providers: SeriousBroker.it  This test is not yet approved or cleared by the [place]  FDA and has been  authorized for detection and/or diagnosis of SARS-CoV-2 by FDA under an Emergency Use Authorization (EUA). This EUA will remain in effect (meaning this test can be used) for the duration of the COVID-19 declaration under Section 564(b)(1) of the Act, 21 U.S.C. section 360bbb-3(b)(1), unless the authorization is terminated or revoked.  Performed at [facility], [address] [place], [place] [address]   Resp panel by RT-PCR (RSV, Flu A&B, Covid) Anterior Nasal Swab     Status: None   Collection Time: 03/09/24  7:18 PM   Specimen: Anterior Nasal Swab  Result Value Ref Range Status   SARS Coronavirus 2 by RT PCR NEGATIVE NEGATIVE Final    Comment: (NOTE) SARS-CoV-2 target nucleic acids are NOT DETECTED.  The SARS-CoV-2 RNA is generally detectable in upper respiratory specimens during the acute phase of infection. The lowest concentration of SARS-CoV-2 viral copies this assay can detect is 138 copies/mL. A negative result does not preclude SARS-Cov-2 infection and should not be used as the sole basis for treatment or other patient management decisions. A negative result may occur with  improper specimen collection/handling, submission of specimen other than nasopharyngeal swab, presence of viral mutation(s) within the areas targeted by this assay, and inadequate number of viral copies(<138 copies/mL). A negative result must be combined with clinical observations, patient history, and epidemiological information. The expected result is Negative.  Fact Sheet for Patients:  BloggerCourse.com  Fact Sheet for Healthcare Providers:  SeriousBroker.it  This test is no t yet approved or cleared by the [place]  FDA and  has been authorized for detection and/or diagnosis of SARS-CoV-2 by FDA under an Emergency Use Authorization (EUA). This EUA will remain  in effect (meaning this test can be used) for the duration of  the COVID-19 declaration under Section 564(b)(1) of the Act, 21 U.S.C.section 360bbb-3(b)(1), unless the authorization is terminated  or revoked sooner.       Influenza A by PCR NEGATIVE NEGATIVE Final   Influenza B by PCR NEGATIVE NEGATIVE Final    Comment: (NOTE) The Xpert Xpress SARS-CoV-2/FLU/RSV plus assay is intended as an aid in the diagnosis of influenza from Nasopharyngeal swab specimens and should not be used as a sole basis for treatment. Nasal washings and aspirates are unacceptable for Xpert Xpress SARS-CoV-2/FLU/RSV testing.  Fact Sheet for Patients: BloggerCourse.com  Fact Sheet for Healthcare Providers: SeriousBroker.it  This test is not yet approved or cleared by the [place]  FDA and has been authorized for detection and/or diagnosis of SARS-CoV-2 by FDA under an Emergency Use Authorization (EUA). This EUA will remain in effect (meaning this test can be used) for the duration of the COVID-19 declaration under Section 564(b)(1) of the Act, 21 U.S.C. section 360bbb-3(b)(1), unless the authorization is terminated or revoked.     Resp Syncytial Virus by PCR NEGATIVE NEGATIVE Final    Comment: (NOTE) Fact Sheet for Patients: BloggerCourse.com  Fact Sheet for Healthcare Providers: SeriousBroker.it  This test is not yet approved or cleared by the [place]  FDA and has been authorized for detection and/or diagnosis of SARS-CoV-2 by FDA under an Emergency Use Authorization (EUA). This EUA will remain in effect (meaning this test can be used) for the duration of the COVID-19 declaration under Section 564(b)(1) of the Act, 21 U.S.C. section 360bbb-3(b)(1), unless the authorization is terminated or revoked.  Performed at [facility], [address] [place], [place] [address]   Respiratory (~20 pathogens) panel by PCR     Status: None    Collection Time: 03/09/24 10:00 PM   Specimen: Nasopharyngeal Swab; Respiratory  Result Value Ref Range Status   Adenovirus NOT DETECTED NOT DETECTED Final   Coronavirus 229E NOT DETECTED NOT DETECTED Final    Comment: (NOTE) The Coronavirus on the Respiratory Panel, DOES NOT test for the novel  Coronavirus (2019 nCoV)    Coronavirus HKU1 NOT DETECTED NOT DETECTED Final   Coronavirus NL63 NOT DETECTED NOT DETECTED Final   Coronavirus OC43 NOT DETECTED NOT DETECTED Final   Metapneumovirus NOT DETECTED NOT DETECTED Final   Rhinovirus / Enterovirus NOT DETECTED NOT DETECTED Final   Influenza A NOT DETECTED NOT DETECTED Final   Influenza B NOT DETECTED NOT DETECTED Final   Parainfluenza Virus 1 NOT DETECTED NOT DETECTED Final   Parainfluenza Virus 2 NOT DETECTED NOT DETECTED Final   Parainfluenza Virus 3 NOT DETECTED NOT DETECTED Final   Parainfluenza Virus 4 NOT DETECTED NOT DETECTED Final   Respiratory Syncytial Virus NOT DETECTED NOT DETECTED Final   Bordetella pertussis NOT DETECTED NOT DETECTED Final   Bordetella Parapertussis NOT DETECTED NOT DETECTED Final   Chlamydophila pneumoniae NOT DETECTED NOT DETECTED Final   Mycoplasma pneumoniae NOT DETECTED NOT DETECTED Final    Comment: Performed at [facility], [address] [place], [place] [address]     Labs: BNP (last 3 results) Recent Labs    07/29/23 2324 12/22/23 0403 03/09/24 1918  BNP 293.4* 1,766.6* 2,527.2*   Basic Metabolic Panel: Recent Labs  Lab 03/05/24 1747 03/09/24 1857 03/10/24 0537 03/11/24 0600 03/12/24 0610  NA 137 135 135 136 136  K 2.9* 3.8 3.6 3.7 3.3*  CL 94* 99 102 98 98  CO2 29 23 23 28 26   GLUCOSE 173* 133* 150* 145* 102*  BUN 14 75* 76* 55* 82*  CREATININE 1.33* 3.21* 3.01* 2.38* 2.95*  CALCIUM  8.4* 9.0 8.9 8.8* 8.7*  PHOS  --   --   --   --  3.5   Liver Function Tests: No results for input(s): AST, ALT, ALKPHOS, BILITOT, PROT, ALBUMIN in the last 168 hours. No  results for input(s): LIPASE, AMYLASE in the last 168 hours. No results for input(s): AMMONIA in the last 168 hours. CBC: Recent Labs  Lab 03/05/24 1747 03/09/24 1857 03/10/24 0537  WBC 5.8 8.6 8.2  HGB 8.0* 8.5* 8.4*  HCT 24.8* 26.1* 25.8*  MCV 92.2 92.2 93.1  PLT 136* 193 196   Cardiac Enzymes: No results for input(s): CKTOTAL, CKMB, CKMBINDEX, TROPONINI in the last 168 hours. BNP: Invalid input(s): POCBNP CBG: Recent Labs  Lab 03/11/24 1703 03/11/24 2124 03/12/24 0815 03/12/24 1333 03/12/24 1413  GLUCAP 189* 138* 98 127* 148*   D-Dimer No results for input(s): DDIMER in the last 72 hours. Hgb A1c No results for input(s): HGBA1C in the last 72 hours. Lipid Profile No results for input(s): CHOL, HDL, LDLCALC, TRIG, CHOLHDL, LDLDIRECT in the last 72 hours. Thyroid  function studies No results for input(s): TSH, T4TOTAL, T3FREE, THYROIDAB in the last 72 hours.  Invalid input(s): FREET3 Anemia work up No results for input(s): VITAMINB12, FOLATE, FERRITIN, TIBC, IRON, RETICCTPCT in the last 72 hours. Urinalysis    Component Value Date/Time   COLORURINE YELLOW (A) 09/24/2023 1050   APPEARANCEUR CLOUDY (A) 09/24/2023 1050   APPEARANCEUR Clear 09/17/2018 1028   LABSPEC 1.012 09/24/2023 1050   LABSPEC 1.013 06/20/2012 1508  PHURINE 5.0 09/24/2023 1050   GLUCOSEU NEGATIVE 09/24/2023 1050   GLUCOSEU Negative 06/20/2012 1508   HGBUR SMALL (A) 09/24/2023 1050   BILIRUBINUR NEGATIVE 09/24/2023 1050   BILIRUBINUR Negative 09/17/2018 1028   BILIRUBINUR Negative 06/20/2012 1508   KETONESUR NEGATIVE 09/24/2023 1050   PROTEINUR 100 (A) 09/24/2023 1050   NITRITE NEGATIVE 09/24/2023 1050   LEUKOCYTESUR LARGE (A) 09/24/2023 1050   LEUKOCYTESUR Negative 06/20/2012 1508   Sepsis Labs Recent Labs  Lab 03/05/24 1747 03/09/24 1857 03/10/24 0537  WBC 5.8 8.6 8.2   Microbiology Recent Results (from the past 240 hours)   Resp panel by RT-PCR (RSV, Flu A&B, Covid) Anterior Nasal Swab     Status: None   Collection Time: 03/05/24  5:47 PM   Specimen: Anterior Nasal Swab  Result Value Ref Range Status   SARS Coronavirus 2 by RT PCR NEGATIVE NEGATIVE Final    Comment: (NOTE) SARS-CoV-2 target nucleic acids are NOT DETECTED.  The SARS-CoV-2 RNA is generally detectable in upper respiratory specimens during the acute phase of infection. The lowest concentration of SARS-CoV-2 viral copies this assay can detect is 138 copies/mL. A negative result does not preclude SARS-Cov-2 infection and should not be used as the sole basis for treatment or other patient management decisions. A negative result may occur with  improper specimen collection/handling, submission of specimen other than nasopharyngeal swab, presence of viral mutation(s) within the areas targeted by this assay, and inadequate number of viral copies(<138 copies/mL). A negative result must be combined with clinical observations, patient history, and epidemiological information. The expected result is Negative.  Fact Sheet for Patients:  BloggerCourse.com  Fact Sheet for Healthcare Providers:  SeriousBroker.it  This test is no t yet approved or cleared by the [place]  FDA and  has been authorized for detection and/or diagnosis of SARS-CoV-2 by FDA under an Emergency Use Authorization (EUA). This EUA will remain  in effect (meaning this test can be used) for the duration of the COVID-19 declaration under Section 564(b)(1) of the Act, 21 U.S.C.section 360bbb-3(b)(1), unless the authorization is terminated  or revoked sooner.       Influenza A by PCR NEGATIVE NEGATIVE Final   Influenza B by PCR NEGATIVE NEGATIVE Final    Comment: (NOTE) The Xpert Xpress SARS-CoV-2/FLU/RSV plus assay is intended as an aid in the diagnosis of influenza from Nasopharyngeal swab specimens and should not be used  as a sole basis for treatment. Nasal washings and aspirates are unacceptable for Xpert Xpress SARS-CoV-2/FLU/RSV testing.  Fact Sheet for Patients: BloggerCourse.com  Fact Sheet for Healthcare Providers: SeriousBroker.it  This test is not yet approved or cleared by the [place]  FDA and has been authorized for detection and/or diagnosis of SARS-CoV-2 by FDA under an Emergency Use Authorization (EUA). This EUA will remain in effect (meaning this test can be used) for the duration of the COVID-19 declaration under Section 564(b)(1) of the Act, 21 U.S.C. section 360bbb-3(b)(1), unless the authorization is terminated or revoked.     Resp Syncytial Virus by PCR NEGATIVE NEGATIVE Final    Comment: (NOTE) Fact Sheet for Patients: BloggerCourse.com  Fact Sheet for Healthcare Providers: SeriousBroker.it  This test is not yet approved or cleared by the [place]  FDA and has been authorized for detection and/or diagnosis of SARS-CoV-2 by FDA under an Emergency Use Authorization (EUA). This EUA will remain in effect (meaning this test can be used) for the duration of the COVID-19 declaration under Section 564(b)(1) of the Act, 21 U.S.C. section  360bbb-3(b)(1), unless the authorization is terminated or revoked.  Performed at [facility], [address] [place], [place] [address]   Resp panel by RT-PCR (RSV, Flu A&B, Covid) Anterior Nasal Swab     Status: None   Collection Time: 03/09/24  7:18 PM   Specimen: Anterior Nasal Swab  Result Value Ref Range Status   SARS Coronavirus 2 by RT PCR NEGATIVE NEGATIVE Final    Comment: (NOTE) SARS-CoV-2 target nucleic acids are NOT DETECTED.  The SARS-CoV-2 RNA is generally detectable in upper respiratory specimens during the acute phase of infection. The lowest concentration of SARS-CoV-2 viral copies this assay can detect  is 138 copies/mL. A negative result does not preclude SARS-Cov-2 infection and should not be used as the sole basis for treatment or other patient management decisions. A negative result may occur with  improper specimen collection/handling, submission of specimen other than nasopharyngeal swab, presence of viral mutation(s) within the areas targeted by this assay, and inadequate number of viral copies(<138 copies/mL). A negative result must be combined with clinical observations, patient history, and epidemiological information. The expected result is Negative.  Fact Sheet for Patients:  BloggerCourse.com  Fact Sheet for Healthcare Providers:  SeriousBroker.it  This test is no t yet approved or cleared by the [place]  FDA and  has been authorized for detection and/or diagnosis of SARS-CoV-2 by FDA under an Emergency Use Authorization (EUA). This EUA will remain  in effect (meaning this test can be used) for the duration of the COVID-19 declaration under Section 564(b)(1) of the Act, 21 U.S.C.section 360bbb-3(b)(1), unless the authorization is terminated  or revoked sooner.       Influenza A by PCR NEGATIVE NEGATIVE Final   Influenza B by PCR NEGATIVE NEGATIVE Final    Comment: (NOTE) The Xpert Xpress SARS-CoV-2/FLU/RSV plus assay is intended as an aid in the diagnosis of influenza from Nasopharyngeal swab specimens and should not be used as a sole basis for treatment. Nasal washings and aspirates are unacceptable for Xpert Xpress SARS-CoV-2/FLU/RSV testing.  Fact Sheet for Patients: BloggerCourse.com  Fact Sheet for Healthcare Providers: SeriousBroker.it  This test is not yet approved or cleared by the [place]  FDA and has been authorized for detection and/or diagnosis of SARS-CoV-2 by FDA under an Emergency Use Authorization (EUA). This EUA will remain in effect  (meaning this test can be used) for the duration of the COVID-19 declaration under Section 564(b)(1) of the Act, 21 U.S.C. section 360bbb-3(b)(1), unless the authorization is terminated or revoked.     Resp Syncytial Virus by PCR NEGATIVE NEGATIVE Final    Comment: (NOTE) Fact Sheet for Patients: BloggerCourse.com  Fact Sheet for Healthcare Providers: SeriousBroker.it  This test is not yet approved or cleared by the [place]  FDA and has been authorized for detection and/or diagnosis of SARS-CoV-2 by FDA under an Emergency Use Authorization (EUA). This EUA will remain in effect (meaning this test can be used) for the duration of the COVID-19 declaration under Section 564(b)(1) of the Act, 21 U.S.C. section 360bbb-3(b)(1), unless the authorization is terminated or revoked.  Performed at [facility], [address] [place], [place] [address]   Respiratory (~20 pathogens) panel by PCR     Status: None   Collection Time: 03/09/24 10:00 PM   Specimen: Nasopharyngeal Swab; Respiratory  Result Value Ref Range Status   Adenovirus NOT DETECTED NOT DETECTED Final   Coronavirus 229E NOT DETECTED NOT DETECTED Final    Comment: (NOTE) The Coronavirus on the  Respiratory Panel, DOES NOT test for the novel  Coronavirus (2019 nCoV)    Coronavirus HKU1 NOT DETECTED NOT DETECTED Final   Coronavirus NL63 NOT DETECTED NOT DETECTED Final   Coronavirus OC43 NOT DETECTED NOT DETECTED Final   Metapneumovirus NOT DETECTED NOT DETECTED Final   Rhinovirus / Enterovirus NOT DETECTED NOT DETECTED Final   Influenza A NOT DETECTED NOT DETECTED Final   Influenza B NOT DETECTED NOT DETECTED Final   Parainfluenza Virus 1 NOT DETECTED NOT DETECTED Final   Parainfluenza Virus 2 NOT DETECTED NOT DETECTED Final   Parainfluenza Virus 3 NOT DETECTED NOT DETECTED Final   Parainfluenza Virus 4 NOT DETECTED NOT DETECTED Final   Respiratory Syncytial  Virus NOT DETECTED NOT DETECTED Final   Bordetella pertussis NOT DETECTED NOT DETECTED Final   Bordetella Parapertussis NOT DETECTED NOT DETECTED Final   Chlamydophila pneumoniae NOT DETECTED NOT DETECTED Final   Mycoplasma pneumoniae NOT DETECTED NOT DETECTED Final    Comment: Performed at [facility], [address] [place], [place] [address]     Time coordinating discharge: Over 30 minutes  SIGNED:   Tiajuana Fluke, MD  [facility] 03/12/2024, 2:31 PM Pager   If 7PM-7AM, please contact night-coverage

## 2024-03-12 NOTE — Progress Notes (Signed)
 Pt goal not met d/t pt c/o weak. UF off 500 ml. Breeze. Shantelle  NP notified.  03/12/24 1302  Vitals  Temp 98.6 F (37 C)  Temp Source Oral  BP (!) 128/42  MAP (mmHg) 66  BP Location Left Arm  BP Method Automatic  Patient Position (if appropriate) Lying  Pulse Rate (!) 56  ECG Heart Rate (!) 56  Resp 20  Oxygen Therapy  SpO2 97 %  O2 Device Room Air  During Treatment Monitoring  Blood Flow Rate (mL/min) 0 mL/min  Arterial Pressure (mmHg) 0 mmHg  Venous Pressure (mmHg) 0 mmHg  TMP (mmHg) 13.13 mmHg  Ultrafiltration Rate (mL/min) 0 mL/min  Dialysate Flow Rate (mL/min) 300 ml/min  Dialysate Potassium Concentration 4  Dialysate Calcium  Concentration 2.25  Duration of HD Treatment -hour(s) 3.33 hour(s)  Cumulative Fluid Removed (mL) per Treatment  411.42  HD Safety Checks Performed Yes  Intra-Hemodialysis Comments Tx completed  Post Treatment  Dialyzer Clearance Lightly streaked  Hemodialysis Intake (mL) 0 mL  Liters Processed 80  Fluid Removed (mL) 500 mL  Tolerated HD Treatment Yes  Post-Hemodialysis Comments see notes  Fistula / Graft Left Upper arm  No placement date or time found.   Placed prior to admission: Yes  Orientation: Left  Access Location: Upper arm  Site Condition No complications  Fistula / Graft Assessment Present;Thrill;Bruit  Status Deaccessed  Drainage Description None  Hemodialysis Catheter Left Internal jugular Double lumen Permanent (Tunneled)  Placement Date/Time: 12/31/23 1225   Serial / Lot #: [id number]  Expiration Date: 08/01/26  Time Out: Correct patient;Correct site;Correct procedure  Maximum sterile barrier precautions: Hand hygiene;Large sterile sheet;Sterile probe cover;Cap;Mask;St...  Site Condition No complications  Blue Lumen Status Heparin  locked  Red Lumen Status Heparin  locked  Catheter fill solution Heparin  1000 units/ml  Catheter fill volume (Arterial) 2.2 cc  Catheter fill volume (Venous) 2.2  Dressing Type Gauze/Drain  sponge;Transparent  Dressing Status Antimicrobial disc/dressing in place;Clean, Dry, Intact  Interventions New dressing  Drainage Description None  Dressing Change Due 03/17/24  Post treatment catheter status Capped and Clamped

## 2024-03-12 NOTE — Progress Notes (Signed)
 [facility]  ROUNDING NOTE   Subjective:   [name] is a 83 y.o. female with past medical conditions including diastolic heart failure, hypertension, hypothyroidism, COPD, and end stage renal disease on hemodialysis.  Patient presents to the emergency department complaining of shortness of breath and has been admitted for SOB (shortness of breath) [R06.02] ESRD (end stage renal disease) on dialysis (HCC) [N18.6, Z99.2] Acute dyspnea [R06.00] Exacerbation of asthma, unspecified asthma severity, unspecified whether persistent [J45.901]  Patient is known to our practice and receives outpatient dialysis treatments at [facility], on a MWF schedule, supervised by Dr Rhesa Celeste.   Update Patient seen and evaluated during dialysis   HEMODIALYSIS FLOWSHEET:  Blood Flow Rate (mL/min): 400 mL/min Arterial Pressure (mmHg): -175.34 mmHg Venous Pressure (mmHg): 180.41 mmHg TMP (mmHg): 8.68 mmHg Ultrafiltration Rate (mL/min): 543 mL/min Dialysate Flow Rate (mL/min): 300 ml/min  Patient appears well Denies any further shortness of breath   Objective:  Vital signs in last 24 hours:  Temp:  [97.4 F (36.3 C)-98.2 F (36.8 C)] 97.8 F (36.6 C) (06/11 0932) Pulse Rate:  [55-72] 58 (06/11 1100) Resp:  [14-30] 18 (06/11 1100) BP: (128-159)/(35-60) 134/48 (06/11 1130) SpO2:  [89 %-99 %] 99 % (06/11 1100) Weight:  [72 kg] 72 kg (06/11 0932)  Weight change: -0.318 kg Filed Weights   03/11/24 0524 03/12/24 0532 03/12/24 0932  Weight: 72.3 kg 72 kg 72 kg    Intake/Output: I/O last 3 completed shifts: In: 600 [P.O.:600] Out: 450 [Urine:450]   Intake/Output this shift:  Total I/O In: -  Out: 200 [Urine:200]  Physical Exam: General: NAD  Head: Normocephalic, atraumatic. Moist oral mucosal membranes  Eyes: Anicteric  Lungs:  Clear to auscultation, normal effort  Heart: Regular rate and rhythm  Abdomen:  Soft, nontender  Extremities: Trace peripheral edema.   Neurologic: Awake, alert, conversant  Skin: Warm,dry, no rash  Access: Right IJ PermCath, left aVF    Basic Metabolic Panel: Recent Labs  Lab 03/05/24 1747 03/09/24 1857 03/10/24 0537 03/11/24 0600 03/12/24 0610  NA 137 135 135 136 136  K 2.9* 3.8 3.6 3.7 3.3*  CL 94* 99 102 98 98  CO2 29 23 23 28 26   GLUCOSE 173* 133* 150* 145* 102*  BUN 14 75* 76* 55* 82*  CREATININE 1.33* 3.21* 3.01* 2.38* 2.95*  CALCIUM  8.4* 9.0 8.9 8.8* 8.7*    Liver Function Tests: No results for input(s): AST, ALT, ALKPHOS, BILITOT, PROT, ALBUMIN in the last 168 hours. No results for input(s): LIPASE, AMYLASE in the last 168 hours. No results for input(s): AMMONIA in the last 168 hours.  CBC: Recent Labs  Lab 03/05/24 1747 03/09/24 1857 03/10/24 0537  WBC 5.8 8.6 8.2  HGB 8.0* 8.5* 8.4*  HCT 24.8* 26.1* 25.8*  MCV 92.2 92.2 93.1  PLT 136* 193 196    Cardiac Enzymes: No results for input(s): CKTOTAL, CKMB, CKMBINDEX, TROPONINI in the last 168 hours.  BNP: Invalid input(s): POCBNP  CBG: Recent Labs  Lab 03/11/24 0828 03/11/24 1232 03/11/24 1703 03/11/24 2124 03/12/24 0815  GLUCAP 110* 71 189* 138* 98    Microbiology: Results for orders placed or performed during the hospital encounter of 03/09/24  Resp panel by RT-PCR (RSV, Flu A&B, Covid) Anterior Nasal Swab     Status: None   Collection Time: 03/09/24  7:18 PM   Specimen: Anterior Nasal Swab  Result Value Ref Range Status   SARS Coronavirus 2 by RT PCR NEGATIVE NEGATIVE Final  Comment: (NOTE) SARS-CoV-2 target nucleic acids are NOT DETECTED.  The SARS-CoV-2 RNA is generally detectable in upper respiratory specimens during the acute phase of infection. The lowest concentration of SARS-CoV-2 viral copies this assay can detect is 138 copies/mL. A negative result does not preclude SARS-Cov-2 infection and should not be used as the sole basis for treatment or other patient management decisions.  A negative result may occur with  improper specimen collection/handling, submission of specimen other than nasopharyngeal swab, presence of viral mutation(s) within the areas targeted by this assay, and inadequate number of viral copies(<138 copies/mL). A negative result must be combined with clinical observations, patient history, and epidemiological information. The expected result is Negative.  Fact Sheet for Patients:  BloggerCourse.com  Fact Sheet for Healthcare Providers:  SeriousBroker.it  This test is no t yet approved or cleared by the [place]  FDA and  has been authorized for detection and/or diagnosis of SARS-CoV-2 by FDA under an Emergency Use Authorization (EUA). This EUA will remain  in effect (meaning this test can be used) for the duration of the COVID-19 declaration under Section 564(b)(1) of the Act, 21 U.S.C.section 360bbb-3(b)(1), unless the authorization is terminated  or revoked sooner.       Influenza A by PCR NEGATIVE NEGATIVE Final   Influenza B by PCR NEGATIVE NEGATIVE Final    Comment: (NOTE) The Xpert Xpress SARS-CoV-2/FLU/RSV plus assay is intended as an aid in the diagnosis of influenza from Nasopharyngeal swab specimens and should not be used as a sole basis for treatment. Nasal washings and aspirates are unacceptable for Xpert Xpress SARS-CoV-2/FLU/RSV testing.  Fact Sheet for Patients: BloggerCourse.com  Fact Sheet for Healthcare Providers: SeriousBroker.it  This test is not yet approved or cleared by the [place]  FDA and has been authorized for detection and/or diagnosis of SARS-CoV-2 by FDA under an Emergency Use Authorization (EUA). This EUA will remain in effect (meaning this test can be used) for the duration of the COVID-19 declaration under Section 564(b)(1) of the Act, 21 U.S.C. section 360bbb-3(b)(1), unless the authorization  is terminated or revoked.     Resp Syncytial Virus by PCR NEGATIVE NEGATIVE Final    Comment: (NOTE) Fact Sheet for Patients: BloggerCourse.com  Fact Sheet for Healthcare Providers: SeriousBroker.it  This test is not yet approved or cleared by the [place]  FDA and has been authorized for detection and/or diagnosis of SARS-CoV-2 by FDA under an Emergency Use Authorization (EUA). This EUA will remain in effect (meaning this test can be used) for the duration of the COVID-19 declaration under Section 564(b)(1) of the Act, 21 U.S.C. section 360bbb-3(b)(1), unless the authorization is terminated or revoked.  Performed at [facility], [address] [place], [place] [address]   Respiratory (~20 pathogens) panel by PCR     Status: None   Collection Time: 03/09/24 10:00 PM   Specimen: Nasopharyngeal Swab; Respiratory  Result Value Ref Range Status   Adenovirus NOT DETECTED NOT DETECTED Final   Coronavirus 229E NOT DETECTED NOT DETECTED Final    Comment: (NOTE) The Coronavirus on the Respiratory Panel, DOES NOT test for the novel  Coronavirus (2019 nCoV)    Coronavirus HKU1 NOT DETECTED NOT DETECTED Final   Coronavirus NL63 NOT DETECTED NOT DETECTED Final   Coronavirus OC43 NOT DETECTED NOT DETECTED Final   Metapneumovirus NOT DETECTED NOT DETECTED Final   Rhinovirus / Enterovirus NOT DETECTED NOT DETECTED Final   Influenza A NOT DETECTED NOT DETECTED Final   Influenza B NOT DETECTED NOT  DETECTED Final   Parainfluenza Virus 1 NOT DETECTED NOT DETECTED Final   Parainfluenza Virus 2 NOT DETECTED NOT DETECTED Final   Parainfluenza Virus 3 NOT DETECTED NOT DETECTED Final   Parainfluenza Virus 4 NOT DETECTED NOT DETECTED Final   Respiratory Syncytial Virus NOT DETECTED NOT DETECTED Final   Bordetella pertussis NOT DETECTED NOT DETECTED Final   Bordetella Parapertussis NOT DETECTED NOT DETECTED Final   Chlamydophila  pneumoniae NOT DETECTED NOT DETECTED Final   Mycoplasma pneumoniae NOT DETECTED NOT DETECTED Final    Comment: Performed at [facility], [address], [place], [place] [address]    Coagulation Studies: No results for input(s): LABPROT, INR in the last 72 hours.  Urinalysis: No results for input(s): COLORURINE, LABSPEC, PHURINE, GLUCOSEU, HGBUR, BILIRUBINUR, KETONESUR, PROTEINUR, UROBILINOGEN, NITRITE, LEUKOCYTESUR in the last 72 hours.  Invalid input(s): APPERANCEUR    Imaging: ECHOCARDIOGRAM COMPLETE Result Date: 03/11/2024    ECHOCARDIOGRAM REPORT   Patient Name:   JOLIANA CLAFLIN [facility] Date of Exam: 03/11/2024 Medical Rec #:  [id number]     Height:       62.0 in Accession #:    [id number]    Weight:       159.4 lb Date of Birth:  June 09, 1941     BSA:          1.736 m Patient Age:    83 years      BP:           142/51 mmHg Patient Gender: F             HR:           65 bpm. Exam Location:  [facility] Procedure: 2D Echo, Color Doppler and Cardiac Doppler (Both Spectral and Color            Flow Doppler were utilized during procedure). Indications:     Dyspnea R06.00  History:         Patient has prior history of Echocardiogram examinations, most                  recent 04/19/2023. Risk Factors:Hypertension and Dyslipidemia.  Sonographer:     Broadus Canes Referring Phys:  [id number] [facility] [facility] Diagnosing Phys: Sammy Crisp MD IMPRESSIONS  1. Left ventricular ejection fraction, by estimation, is 55 to 60%. The left ventricle has normal function. The left ventricle has no regional wall motion abnormalities. The left ventricular internal cavity size was severely dilated. There is mild left ventricular hypertrophy. Left ventricular diastolic parameters are consistent with Grade III diastolic dysfunction (restrictive). Elevated left atrial pressure.  2. Right ventricular systolic function is normal. The right ventricular size is normal. There is moderately elevated pulmonary artery  systolic pressure.  3. Left atrial size was severely dilated.  4. The mitral valve is abnormal. Severe mitral valve regurgitation. No evidence of mitral stenosis.  5. Tricuspid valve regurgitation is mild to moderate.  6. The aortic valve was not well visualized. Aortic valve regurgitation is not visualized. No aortic stenosis is present.  7. The inferior vena cava is normal in size with greater than 50% respiratory variability, suggesting right atrial pressure of 3 mmHg. Comparison(s): A prior study was performed on 04/19/2023. Changes from prior study are noted. Mitral regurgitation is significantly worse on today's study. FINDINGS  Left Ventricle: Left ventricular ejection fraction, by estimation, is 55 to 60%. The left ventricle has normal function. The left ventricle has no regional wall motion abnormalities. The left ventricular internal cavity  size was severely dilated. There is mild left ventricular hypertrophy. Left ventricular diastolic parameters are consistent with Grade III diastolic dysfunction (restrictive). Elevated left atrial pressure. Right Ventricle: The right ventricular size is normal. No increase in right ventricular wall thickness. Right ventricular systolic function is normal. There is moderately elevated pulmonary artery systolic pressure. The tricuspid regurgitant velocity is 3.71 m/s, and with an assumed right atrial pressure of 3 mmHg, the estimated right ventricular systolic pressure is 58.1 mmHg. Left Atrium: Left atrial size was severely dilated. Right Atrium: Right atrial size was normal in size. Pericardium: Trivial pericardial effusion is present. Mitral Valve: The mitral valve is abnormal. Mild mitral annular calcification. Severe mitral valve regurgitation, with posteriorly-directed jet. No evidence of mitral valve stenosis. MV peak gradient, 12.2 mmHg. The mean mitral valve gradient is 5.0 mmHg. Tricuspid Valve: The tricuspid valve is normal in structure. Tricuspid valve  regurgitation is mild to moderate. Aortic Valve: The aortic valve was not well visualized. Aortic valve regurgitation is not visualized. No aortic stenosis is present. Aortic valve mean gradient measures 6.0 mmHg. Aortic valve peak gradient measures 11.2 mmHg. Aortic valve area, by VTI measures 2.34 cm. Pulmonic Valve: The pulmonic valve was not well visualized. Pulmonic valve regurgitation is not visualized. No evidence of pulmonic stenosis. Aorta: The aortic root is normal in size and structure. Pulmonary Artery: The pulmonary artery is not well seen. Venous: The inferior vena cava is normal in size with greater than 50% respiratory variability, suggesting right atrial pressure of 3 mmHg. IAS/Shunts: The interatrial septum was not well visualized.  LEFT VENTRICLE PLAX 2D LVIDd:         6.20 cm   Diastology LVIDs:         4.10 cm   LV e' medial:    5.00 cm/s LV PW:         1.30 cm   LV E/e' medial:  29.6 LV IVS:        1.21 cm   LV e' lateral:   7.94 cm/s LVOT diam:     2.00 cm   LV E/e' lateral: 18.6 LV SV:         97 LV SV Index:   56 LVOT Area:     3.14 cm  RIGHT VENTRICLE RV Basal diam:  3.40 cm RV Mid diam:    3.30 cm RV S prime:     12.90 cm/s TAPSE (M-mode): 2.1 cm LEFT ATRIUM              Index        RIGHT ATRIUM          Index LA diam:        5.50 cm  3.17 cm/m   RA Area:     8.14 cm LA Vol (A2C):   124.0 ml 71.43 ml/m  RA Volume:   13.30 ml 7.66 ml/m LA Vol (A4C):   170.0 ml 97.93 ml/m LA Biplane Vol: 156.0 ml 89.87 ml/m  AORTIC VALVE AV Area (Vmax):    2.31 cm AV Area (Vmean):   2.29 cm AV Area (VTI):     2.34 cm AV Vmax:           167.00 cm/s AV Vmean:          116.000 cm/s AV VTI:            0.416 m AV Peak Grad:      11.2 mmHg AV Mean Grad:      6.0 mmHg LVOT  Vmax:         123.00 cm/s LVOT Vmean:        84.600 cm/s LVOT VTI:          0.310 m LVOT/AV VTI ratio: 0.75  AORTA Ao Root diam: 3.00 cm MITRAL VALVE                TRICUSPID VALVE MV Area (PHT): 5.54 cm     TR Peak grad:   55.1 mmHg  MV Area VTI:   2.17 cm     TR Vmax:        371.00 cm/s MV Peak grad:  12.2 mmHg MV Mean grad:  5.0 mmHg     SHUNTS MV Vmax:       1.75 m/s     Systemic VTI:  0.31 m MV Vmean:      101.0 cm/s   Systemic Diam: 2.00 cm MV Decel Time: 137 msec MR Peak grad: 99.2 mmHg MR Mean grad: 68.0 mmHg MR Vmax:      498.00 cm/s MR Vmean:     389.0 cm/s MR PISA:      5.64 cm MV E velocity: 148.00 cm/s MV A velocity: 64.50 cm/s MV E/A ratio:  2.29 Christopher End MD Electronically signed by Sammy Crisp MD Signature Date/Time: 03/11/2024/12:39:38 PM    Final      Medications:     allopurinol   100 mg Oral Daily   amLODipine   5 mg Oral Daily   apixaban   2.5 mg Oral BID   Chlorhexidine  Gluconate Cloth  6 each Topical Q0600   epoetin  alfa-epbx (RETACRIT ) injection  4,000 Units Intravenous Q M,W,F-1800   fluticasone  furoate-vilanterol  1 puff Inhalation Daily   furosemide   40 mg Intravenous Q12H   guaiFENesin   600 mg Oral BID   hydrALAZINE   100 mg Oral TID   insulin  aspart  0-5 Units Subcutaneous QHS   insulin  aspart  0-6 Units Subcutaneous TID WC   ipratropium-albuterol   3 mL Nebulization BID   levothyroxine   75 mcg Oral Daily   metoprolol  tartrate  25 mg Oral BID   montelukast   10 mg Oral QHS   predniSONE   40 mg Oral Q breakfast   rosuvastatin   40 mg Oral Daily   sertraline   100 mg Oral Daily   acetaminophen  **OR** acetaminophen , albuterol , alteplase , heparin , ondansetron  **OR** ondansetron  (ZOFRAN ) IV  Assessment/ Plan:  [name] is a 83 y.o.  female with past medical conditions including diastolic heart failure, hypertension, hypothyroidism, COPD, and end stage renal disease on hemodialysis.  She presents to the emergency department complaining of shortness of breath and has been admitted for SOB (shortness of breath) [R06.02] ESRD (end stage renal disease) on dialysis (HCC) [N18.6, Z99.2] Acute dyspnea [R06.00] Exacerbation of asthma, unspecified asthma severity, unspecified whether  persistent [J45.901]   End-stage renal disease on hemodialysis.  Receiving dialysis today, UF 1L as tolerated. Next treatment scheduled for Friday.  Will defer discharge plan to primary team.  If stable, patient cleared from renal stents to discharge after treatment.  2.  Acute respiratory failure, patient on 2 L nasal cannula.  Chest x-ray shows bilateral atelectasis, right greater than left along with small pleural effusion.  Denies any current shortness of breath.  3. Anemia of chronic kidney disease Lab Results  Component Value Date   HGB 8.4 (L) 03/10/2024    Continue Retacrit  4000 units with dialysis.  4. Secondary Hyperparathyroidism: with outpatient labs: PTH 12, phosphorus 6.5, calcium  10 on 12/13/2023.  Lab Results  Component Value Date   CALCIUM  8.7 (L) 03/12/2024   PHOS 2.4 (L) 12/29/2023    Calcium  at goal.  Awaiting updated phosphorus  5.  Hypertension with chronic kidney disease Home regimen includes amlodipine , furosemide , hydralazine , and metoprolol .  All have been continued during this admission.  Blood pressure 134/48 during dialysis    LOS: 2 Jhordan Mckibben 6/11/202511:48 AM

## 2024-03-20 ENCOUNTER — Ambulatory Visit (INDEPENDENT_AMBULATORY_CARE_PROVIDER_SITE_OTHER): Admitting: Nurse Practitioner

## 2024-03-20 ENCOUNTER — Encounter (INDEPENDENT_AMBULATORY_CARE_PROVIDER_SITE_OTHER)

## 2024-03-27 ENCOUNTER — Encounter: Payer: Self-pay | Admitting: Physician Assistant

## 2024-03-27 ENCOUNTER — Ambulatory Visit (INDEPENDENT_AMBULATORY_CARE_PROVIDER_SITE_OTHER): Admitting: Physician Assistant

## 2024-03-27 VITALS — BP 145/48 | HR 69 | Resp 16 | Ht 62.0 in | Wt 166.0 lb

## 2024-03-27 DIAGNOSIS — R5381 Other malaise: Secondary | ICD-10-CM

## 2024-03-27 DIAGNOSIS — Z09 Encounter for follow-up examination after completed treatment for conditions other than malignant neoplasm: Secondary | ICD-10-CM | POA: Diagnosis not present

## 2024-03-27 DIAGNOSIS — I10 Essential (primary) hypertension: Secondary | ICD-10-CM | POA: Diagnosis not present

## 2024-03-27 DIAGNOSIS — J449 Chronic obstructive pulmonary disease, unspecified: Secondary | ICD-10-CM

## 2024-03-27 DIAGNOSIS — E039 Hypothyroidism, unspecified: Secondary | ICD-10-CM

## 2024-03-27 DIAGNOSIS — J9601 Acute respiratory failure with hypoxia: Secondary | ICD-10-CM

## 2024-03-27 DIAGNOSIS — Z992 Dependence on renal dialysis: Secondary | ICD-10-CM

## 2024-03-27 DIAGNOSIS — I071 Rheumatic tricuspid insufficiency: Secondary | ICD-10-CM

## 2024-03-27 DIAGNOSIS — N186 End stage renal disease: Secondary | ICD-10-CM | POA: Diagnosis not present

## 2024-03-27 DIAGNOSIS — G4733 Obstructive sleep apnea (adult) (pediatric): Secondary | ICD-10-CM

## 2024-03-27 DIAGNOSIS — F32A Depression, unspecified: Secondary | ICD-10-CM

## 2024-03-27 DIAGNOSIS — E7849 Other hyperlipidemia: Secondary | ICD-10-CM

## 2024-03-27 DIAGNOSIS — I340 Nonrheumatic mitral (valve) insufficiency: Secondary | ICD-10-CM

## 2024-03-28 NOTE — Progress Notes (Signed)
 Established patient visit  Patient: [name]   DOB: [birth year]   [name]  MRN: [id number] Visit Date: 03/27/2024  Today's healthcare provider: Jolynn Spencer, PA-C   Chief Complaint  Patient presents with   Follow-up    F/u on Chronic Disease    Subjective     HPI     Follow-up    Additional comments: F/u on Chronic Disease       Last edited by Marylen Odella CROME, CMA on 03/27/2024  1:46 PM.       Discussed the use of AI scribe software for clinical note transcription with the patient, who gave verbal consent to proceed.  History of Present Illness [name] is an 83 year old female with chronic kidney disease and hypertension who presents with concerns about low blood pressure and recent hospitalization for shortness of breath.  She experiences consistently low blood pressure, especially during dialysis, with readings around 112/48 mmHg. She avoids antihypertensive medication on dialysis days to prevent further hypotension. She was recently hospitalized for shortness of breath and treated with antibiotics and prednisone , which she associates with fluid retention. She uses a nebulizer daily and an inhaler as needed, with no current wheezing or dyspnea.  She attends dialysis thrice weekly and is transitioning to a new vascular access site. She prefers a facility in [place] for better care and convenience. She takes Lasix  twice daily for fluid management and experiences frequent urination.  Her medication regimen includes apixaban , levothyroxine , metoprolol , montelukast , sertraline , B12 supplements, calcium , vitamin D , and acid reflux medication. She has discontinued azelastine  and doxycycline . She uses a CPAP machine twice daily for sleep apnea and occasionally requires oxygen during dialysis. She drives herself to appointments but is cautious when feeling dizzy. Her blood sugar ranges from 108 to 121 mg/dL, with no diabetes medication.       03/27/2024    3:18 PM  02/15/2024   10:33 AM 08/14/2023   11:17 AM  Depression screen PHQ 2/9  Decreased Interest 0 1 0  Down, Depressed, Hopeless 0 1 0  PHQ - 2 Score 0 2 0  Altered sleeping 1 3   Tired, decreased energy 1 2   Change in appetite 0 0   Feeling bad or failure about yourself  0 0   Trouble concentrating 0 0   Moving slowly or fidgety/restless 0 3   Suicidal thoughts 0 0   PHQ-9 Score 2 10   Difficult doing work/chores Not difficult at all Not difficult at all       03/27/2024    3:18 PM 02/15/2024   10:34 AM  GAD 7 : Generalized Anxiety Score  Nervous, Anxious, on Edge 0 0  Control/stop worrying 0 0  Worry too much - different things 0   Trouble relaxing 0 2  Restless 0 3  Easily annoyed or irritable 0 0  Afraid - awful might happen 0 0  Total GAD 7 Score 0   Anxiety Difficulty Not difficult at all Not difficult at all    Medications: Outpatient Medications Prior to Visit  Medication Sig   acetaminophen  (TYLENOL ) 500 MG tablet Take 1,000 mg by mouth every 6 (six) hours as needed for mild pain or moderate pain.   albuterol  (VENTOLIN  HFA) 108 (90 Base) MCG/ACT inhaler Inhale 2 puffs into the lungs every 6 (six) hours as needed for wheezing or shortness of breath.   allopurinol  (ZYLOPRIM ) 100 MG tablet Take 100 mg by mouth daily.   amLODipine  (  NORVASC ) 5 MG tablet Take 1 tablet (5 mg total) by mouth daily.   apixaban  (ELIQUIS ) 2.5 MG TABS tablet Take 1 tablet (2.5 mg total) by mouth 2 (two) times daily.   azelastine  (ASTELIN ) 0.1 % nasal spray Place 2 sprays into both nostrils 2 (two) times daily. Use in each nostril as directed   epoetin  alfa-epbx (RETACRIT ) 4000 UNIT/ML injection Inject 1 mL (4,000 Units total) into the vein every Monday, Wednesday, and Friday at 6 PM.   esomeprazole  (NEXIUM ) 40 MG capsule Take 1 capsule (40 mg total) by mouth daily before breakfast.   ferrous sulfate 325 (65 FE) MG tablet Take 325 mg by mouth daily with breakfast.   fluticasone  (FLONASE ) 50 MCG/ACT  nasal spray Place 2 sprays into both nostrils daily.   fluticasone  furoate-vilanterol (BREO ELLIPTA ) 200-25 MCG/ACT AEPB Inhale 1 puff into the lungs daily.   folic acid  (FOLVITE ) 1 MG tablet Take 1 tablet (1 mg total) by mouth daily.   furosemide  (LASIX ) 40 MG tablet TAKE ONE (1) TABLET BY MOUTH TWO TIMES PER DAY   hydrALAZINE  (APRESOLINE ) 100 MG tablet Take 1 tablet (100 mg total) by mouth 3 (three) times daily.   ipratropium-albuterol  (DUONEB) 0.5-2.5 (3) MG/3ML SOLN Take 3 mLs by nebulization every 6 (six) hours as needed (SOB).   levothyroxine  (SYNTHROID ) 150 MCG tablet Take 150 mcg by mouth every morning.   levothyroxine  (SYNTHROID ) 75 MCG tablet Take 75 mcg by mouth daily.   lidocaine -prilocaine  (EMLA ) cream as directed. Before dialysis   loperamide  (IMODIUM ) 2 MG capsule Take 1 capsule (2 mg total) by mouth as needed for diarrhea or loose stools.   metoprolol  tartrate (LOPRESSOR ) 50 MG tablet Take 25 mg by mouth 2 (two) times daily.   montelukast  (SINGULAIR ) 10 MG tablet Take 1 tablet (10 mg total) by mouth at bedtime.   Multiple Minerals-Vitamins (CALCIUM  & VIT D3 BONE HEALTH PO) Take 1 tablet by mouth daily. 600 mg/ 25 mg   rosuvastatin  (CRESTOR ) 40 MG tablet Take 40 mg by mouth daily.   senna-docusate (SENOKOT-S) 8.6-50 MG tablet Take 2 tablets by mouth at bedtime as needed for mild constipation.   sertraline  (ZOLOFT ) 100 MG tablet TAKE 1 TABLET BY MOUTH DAILY   vitamin B-12 (CYANOCOBALAMIN ) 500 MCG tablet Take 500 mcg by mouth daily.   No facility-administered medications prior to visit.    Review of Systems All negative Except see HPI   {Insert previous labs (optional):23779} {See past labs  Heme  Chem  Endocrine  Serology  Results Review (optional):1}   Objective    BP (!) 145/48 (BP Location: Right Arm, Patient Position: Sitting, Cuff Size: Normal)   Pulse 69   Resp 16   Ht 5' 2 (1.575 m)   Wt 166 lb (75.3 kg)   SpO2 95%   BMI 30.36 kg/m  {Insert last BP/Wt  (optional):23777}{See vitals history (optional):1}   Physical Exam Vitals reviewed.  Constitutional:      General: She is not in acute distress.    Appearance: Normal appearance. She is well-developed. She is not diaphoretic.  HENT:     Head: Normocephalic and atraumatic.   Eyes:     General: No scleral icterus.    Conjunctiva/sclera: Conjunctivae normal.   Neck:     Thyroid : No thyromegaly.   Cardiovascular:     Rate and Rhythm: Normal rate and regular rhythm.     Pulses: Normal pulses.     Heart sounds: Normal heart sounds. No murmur heard. Pulmonary:  Effort: Pulmonary effort is normal. No respiratory distress.     Breath sounds: Normal breath sounds. No wheezing, rhonchi or rales.   Musculoskeletal:     Cervical back: Neck supple.     Right lower leg: No edema.     Left lower leg: No edema.  Lymphadenopathy:     Cervical: No cervical adenopathy.   Skin:    General: Skin is warm and dry.     Findings: No rash.   Neurological:     Mental Status: She is alert and oriented to person, place, and time. Mental status is at baseline.   Psychiatric:        Mood and Affect: Mood normal.        Behavior: Behavior normal.      No results found for any visits on 03/27/24.      Assessment & Plan Hypertension  Chronic, unstable today Continue to take amlodipine  5 Mg, furosemide  40 Mg, hydralazine  100 Mg, metoprolol  50 Mg Experiences hypotension post-dialysis with readings around 112/48 mmHg. Avoids antihypertensives pre-dialysis. - Monitor blood pressure post-dialysis. - Avoid antihypertensives before dialysis. Continue low-salt diet and exercise as tolerated Will follow-up  Hyperlipidemia Chronic Continue taking rosuvastatin /Crestor  40 Mg daily Continue lifestyle modifications We will follow-up  Depression/anxiety    03/27/2024    3:18 PM 02/15/2024   10:33 AM 08/14/2023   11:17 AM  PHQ9 SCORE ONLY  PHQ-9 Total Score 2 10 0  Chronic and  stable Continue taking Zoloft  100 Mg daily Will follow-up  Hypothyroidism Chronic Stable Continue taking levothyroxine  150 mcg? Will follow-up   Fluid Overload Improved fluid retention was due to prednisone , was managed with dialysis and furosemide . Patient was discharge on 03/12/24, she was admitted to hospital for heart failure with preserved ejection fraction hypertension mitral regurgitation, tricuspid regurgitation - Continue dialysis three times a week. - Administer furosemide  40 Mg bid as prescribed. - Follow up with cardiology and nephrology. Patient refused continue with physical therapy as she feels she is at baseline  Chronic Kidney Disease (CKD) Undergoes dialysis thrice weekly. Transitioning care to a new vascular center in [place]. - Continue dialysis schedule. - Follow up with nephrology and vascular surgery. - Ensure all medical records are sent to primary care. Will follow-up  Chronic Obstructive Pulmonary Disease (COPD) Manages COPD with nebulizer and inhaler.  DuoNeb 0.5-2.5 and albuterol  inhaler Recently treated for dyspnea at the [facility]. No current wheezing or dyspnea.  Normal pulmonary exam - Continue using nebulizer and inhaler.  Completed a course of steroids in hospital - Follow up with pulmonology as needed. Patient stopped taking nasal sprays Sleep Apnea Uses CPAP machine twice daily. Occasionally requires supplemental oxygen during dialysis. - Continue using CPAP machine. - Monitor oxygen levels during dialysis. - Follow up with pulmonology.  General Health Maintenance Advised on healthy lifestyle, diet, and physical activity. Monitoring blood glucose and scheduling routine blood work is important. - Schedule blood work including lipids, metabolic panel, electrolytes, liver and kidney function, and A1c. - Encourage healthy eating and regular physical activity. - Monitor blood glucose levels.  Follow-up Advised to follow up with specialists for  multiple health conditions. Coordinating care and sharing medical records with primary care is emphasized. - Follow up with cardiology, nephrology, pulmonology, and vascular surgery. - Ensure all medical records are sent to primary care. - Schedule follow-up with primary care in one month.    No orders of the defined types were placed in this encounter.   Return for chronic disease f/u  in 4-6 weeks.   The patient was advised to call back or seek an in-person evaluation if the symptoms worsen or if the condition fails to improve as anticipated.  I discussed the assessment and treatment plan with the patient. The patient was provided an opportunity to ask questions and all were answered. The patient agreed with the plan and demonstrated an understanding of the instructions.  I, Mahari Strahm, PA-C have reviewed all documentation for this visit. The documentation on 03/27/2024  for the exam, diagnosis, procedures, and orders are all accurate and complete.  Jolynn Spencer, [facility], MMS [facility] [phone] (phone) [phone] (fax)  [facility]

## 2024-03-29 DIAGNOSIS — R5381 Other malaise: Secondary | ICD-10-CM | POA: Insufficient documentation

## 2024-04-01 ENCOUNTER — Other Ambulatory Visit: Payer: Self-pay | Admitting: Obstetrics and Gynecology

## 2024-04-07 NOTE — Progress Notes (Deleted)
 Cardiology Clinic Note   Date: 04/07/2024 ID: [name], DOB 1941-07-03, MRN [id number]  Primary Cardiologist:  Deatrice Cage, MD  Chief Complaint   [name] is a 83 y.o. female who presents to the clinic today for ***  Patient Profile   [name] is followed by *** for the history outlined below.       Past medical history significant for: Nonobstructive CAD. LHC 08/13/2014 (angina): Ostial D2 20%.  Ostial LCx 20%. Chronic HFpEF. Echo 04/19/2023: EF 60 to 65%.  No RWMA.  Mild LVH.  Indeterminate diastolic parameters.  Normal RV function.  Mild RVH.  Mildly elevated PA pressure, RVSP 36.7 mmHg.  Severe LAE.  Mild RAE.  Mild MR.*** Echo 03/11/2024: EF 55 to 60%.  No RWMA.  Severely dilated internal LV cavity.  Mild LVH.  Grade III DD.  Elevated LA pressure.  Normal RV size/function.  Moderately elevated PA pressure RVSP 58.1 mmHg.  Severe LAE.  Severe MR.  No mitral stenosis.  Mild to moderate TR. PAF. Onset July 2024. 14-day ZIO 06/25/2023: HR 45 to 154 bpm, average 56 bpm.  2 runs of NSVT fastest lasting 4 beats max rate 154 bpm, longest lasting 8 beats average rate 129 bpm.  43 runs of SVT fastest lasting 6 beats max rate 150 bpm, longest lasting 12.7 seconds average rate 103 bpm.  Idioventricular rhythm was present.  Rare ectopy.  Patient triggered events associated with NSR. Hypertension. Hyperlipidemia. OSA. COPD. GERD. T2DM. Hypothyroidism. ESRD on HD. MWF.  In summary, patient was first evaluated by Dr. Cage on 07/24/2014 for chest pain and shortness of breath at the request of Dr. Ike.  She underwent nuclear stress testing which is a moderate risk study demonstrating mid apical, inferior lateral perfusion defect with mild reversibility suggestive of mild to moderate infarct with mild peri-infarct ischemia.  She underwent LHC which showed mild nonobstructive CAD as detailed above.  Patient underwent hospital admission 03/23/2023 to 03/31/2023 due to hypocalcemia  and hypomagnesia.  She was rehospitalized 04/16/2023 to 04/22/2023 with generalized weakness and shortness of breath.  She was found to have A-fib on telemetry.  Echo demonstrated normal LV function as detailed above.  She presented back to [facility] emergency department on 05/22/2023 with complaints of shortness of breath.  She was found to be in A-fib with a rate of 106 bpm.  She spontaneously converted.  She was placed on Eliquis  2.5 mg twice daily for stroke prophylaxis.  She was placed on a 14-day ZIO which demonstrated runs of NSVT and SVT as detailed above.  Patient underwent hospital admission from 12/21/2023 to 01/01/2024 for pneumonia, COPD exacerbation, CHF exacerbation.  Patient presented to the ED on 12/21/2023 with complaints of shortness of breath, cough, fever.  Upon EMS arrival SpO2 was 90% on room air improving to 93% on 4 L.  Plan to start patient on dialysis to help manage volume.  Her hospital course was complicated by AV fistula malfunction and she underwent angioplasty and stent placement with vascular surgery.  She was discharged on 01/01/2024 with plan to begin outpatient dialysis.   Patient was last seen in the office by me on 01/17/2024 for hospital follow up.  She was tolerating hemodialysis well with no issues of hypotension since holding hydralazine  after dialysis.  She was pending home PT.  She reported occasional palpitations described as heart racing with exertion that resolved with rest.  Patient was seen in the ED on 03/05/2024 with complaints of shortness of  breath.  EKG demonstrated sinus rhythm.  She was given breathing treatments with improvement of symptoms and discharged on doxycycline .  She was found to be hypokalemic and potassium was repleted with p.o. potassium.  She again presented to the ED on 03/09/2024 with complaints of worsening shortness of breath and chest pain radiating to back.  Initial labs: WBC 8.6, hemoglobin 8.5, hematocrit 26.1, sodium 135, potassium 3.8, creatinine  3.21, BUN 75, BNP 2527.  Troponin 37>> 43.  Respiratory panel negative.  Chest x-ray showed worsening right greater than left bibasilar atelectasis, small left pleural effusion.  She was admitted for COPD/CHF exacerbation.  He was treated with breathing treatments and given a dose of IV Lasix .  Echo demonstrated EF 55 to 60% (further details above).  She was discharged on 03/12/2024.     History of Present Illness    Today, patient ***  Nonobstructive CAD LHC November 2015 showed ostial D2 20%, ostial LCx 20%.  Patient*** -Continue amlodipine , metoprolol , rosuvastatin .  Patient not on aspirin  secondary to Eliquis .   Chronic HFpEF Echo echo June 2025 showed EF 55 to 60%, mild LVH, normal RV size/function, moderately elevated PA pressure, Grade III DD, severe LAE, severe MR, mild-moderate TR.  Recent hospital admission for heart failure/COPD exacerbation early June 2025.  Patient*** Euvolemic and well compensated on exam.  -Continue metoprolol , hydralazine , Lasix .  Patient not a candidate for SGLT2i or MRA secondary to kidney function. - Volume managed by hemodialysis.   Palpitations/PAF Onset July 2024.  14-day ZIO September 2024 showed HR 45 to 154 bpm, average 56 bpm, 2 runs of NSVT, 43 runs of SVT, rare ectopy.  Denies spontaneous bleeding concerns.  Patient reports occasional palpitations described as heart racing with exertion that resolves quickly with rest.*** -Continue metoprolol , Eliquis . Appropriate Eliquis  dose.   Hypertension BP today***. No report of headaches or dizziness.  -Continue amlodipine , hydralazine , metoprolol .   ESRD Undergoing hemodialysis MWF. She continues to make urine.  She is holding hydralazine  until after dialysis secondary to hypotension during treatment.*** -Continue to follow with nephrology.  ROS: All other systems reviewed and are otherwise negative except as noted in History of Present Illness.  EKGs/Labs Reviewed        02/21/2024: ALT 11; AST  19 03/12/2024: BUN 82; Creatinine, Ser 2.95; Potassium 3.3; Sodium 136   03/10/2024: Hemoglobin 8.4; WBC 8.2   02/21/2024: TSH 3.510   03/09/2024: B Natriuretic Peptide 2,527.2  ***  Risk Assessment/Calculations    {Does this patient have ATRIAL FIBRILLATION?:[phone]} No BP recorded.  {Refresh Note OR Click here to enter BP  :1}***        Physical Exam    VS:  There were no vitals taken for this visit. , BMI There is no height or weight on file to calculate BMI.  GEN: Well nourished, well developed, in no acute distress. Neck: No JVD or carotid bruits. Cardiac: *** RRR. *** No murmur. No rubs or gallops.   Respiratory:  Respirations regular and unlabored. Clear to auscultation without rales, wheezing or rhonchi. GI: Soft, nontender, nondistended. Extremities: Radials/DP/PT 2+ and equal bilaterally. No clubbing or cyanosis. No edema ***  Skin: Warm and dry, no rash. Neuro: Strength intact.  Assessment & Plan   ***  Disposition: ***     {Are you ordering a CV Procedure (e.g. stress test, cath, DCCV, TEE, etc)?   Press F2        :[id number]}   Signed, Barnie HERO. Hyun Marsalis, DNP, NP-C

## 2024-04-10 ENCOUNTER — Ambulatory Visit: Admitting: Student

## 2024-04-11 NOTE — Discharge Summary (Signed)
 [facility] Medicine Discharge Summary  Admit Date: 04/08/2024 Discharge Date: 04/11/2024  4:12 PM  Admitting Physician: Tinnie Jacquetta Door, MD Discharge Physician: FONDA BETTYANN EVES, MD  Primary Care Provider: Dionne Binder, Phone [phone]  Discharge Destination: Home with home health: RN/PT, o2 added to CPAP at night due to nocturnal hypoxia  Admission Diagnoses:  Shortness of breath [R06.02] ESRD (end stage renal disease) (CMS/HHS-HCC) [N18.6] Asthma, unspecified asthma severity, unspecified whether complicated, unspecified whether persistent (HHS-HCC) [J45.909] Acute on chronic congestive heart failure, unspecified heart failure type (CMS/HHS-HCC) [I50.9]  Discharge Diagnoses:  Principal Problem:   Volume overload Active Problems:   HTN (hypertension), benign   Combined hyperlipidemia   Type 2 diabetes mellitus with stage 5 chronic kidney disease not on chronic dialysis, without long-term current use of insulin  (CMS/HHS-HCC)   OSA on CPAP   Gastroesophageal reflux disease without esophagitis   Hypothyroidism, acquired   Acute on chronic diastolic CHF (congestive heart failure), NYHA class 3 (CMS/HHS-HCC)   Gouty arthropathy, chronic, 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 *  Primary Diagnosis: Admitted for SOB in setting of incomplete HD, likely acute HFpEF treated with dialysis and diuresis.   Changes Made (with rationale):  ADDED Torsemide  40 mg PO, DC lasix  ADDED o2 at night with her CPAP for OSA/nocturnal hypoxia  To-Do List (incidental findings, follow-up studies, etc.): F/u with HD next week as outpt  Anticipatory Guidance for Outpatient Care:  Pt's fistulagram showed a patient AV fistula with prior patent stent.     Results Pending at Discharge:  None Please see phone numbers at end of this summary for lab contact information.   Follow-up/Care Transition Plan: Sched. appts: No future appointments.  Follow-up  info: No follow-up provider specified.     Allergies/Intolerances:  Allergies  Allergen Reactions  . Ace Inhibitors Unknown  . Actonel [Risedronate] Unknown  . Egg Diarrhea  . Prednisone  Other (See Comments)    Dry socket right hip and prednisone  makes right hip hurt  . Sulfa (Sulfonamide Antibiotics) Swelling    Joint swelling and itching  . Sulfasalazine Other (See Comments)    Other reaction(s): Other (See Comments)     New Adverse Drug Events: none  Medications:     Current Discharge Medication List     START taking these medications      Instructions  nystatin 100,000 unit/gram powder Quantity: 30 g Refills: 0  Commonly known as: MYCOSTATIN Apply topically 3 (three) times daily Last time this was given: April 11, 2024 12:55 PM   TORsemide  20 MG tablet Quantity: 60 tablet Refills: 0 Start taking on: April 12, 2024  Commonly known as: DEMADEX  Take 2 tablets (40 mg total) by mouth once daily Last time this was given: 40 mg on April 11, 2024 11:06 AM       These medications have been CHANGED      Instructions  metoprolol  TARTrate 50 MG tablet Quantity: 180 tablet Refills: 1 What changed: how much to take  Commonly known as: LOPRESSOR  TAKE 1 TABLET BY MOUTH TWICE DAILY Doctor's comments: PATIENT REQUESTING REFILL; WE RECEIVED CANCELLATION MESSAGE IN OCTOBER; PLEASE VERIFY & SEND UPDATED RX Last time this was given: 50 mg on April 11, 2024 11:14 AM       CONTINUE taking these medications      Instructions  acetaminophen  500 MG tablet Refills: 0  Commonly known as: TYLENOL  Take 1,000 mg by mouth at bedtime Last time this was given: 650 mg on  April 09, 2024  9:48 PM   ADVAIR DISKUS 500-50 mcg/dose diskus inhaler Quantity: 60 each Refills: 3 Generic drug: fluticasone  propion-salmeteroL  INHALE 1 PUFF TWICE A DAY AS DIRECTED **RINSE MOUTH AFTER USE**   albuterol  MDI (PROVENTIL , VENTOLIN , PROAIR ) HFA 90 mcg/actuation inhaler Quantity: 8.5 g Refills:  1  2 INHALATIONS INTO LUNGS EVERY 6 HOURS AS NEEDED FOR WHEEZING Doctor's comments: PT OUT OF REFILLS - Please dispense the generic or branded medication that is covered by plan at lowest cost to patient.   allopurinoL  100 MG tablet Refills: 0  Commonly known as: ZYLOPRIM  Take 100 mg by mouth once daily Last time this was given: 100 mg on April 11, 2024 11:07 AM   amLODIPine  5 MG tablet Refills: 0  Commonly known as: NORVASC  Take 5 mg by mouth once daily Last time this was given: 5 mg on April 10, 2024  8:32 AM   apixaban  2.5 mg tablet Refills: 0  Commonly known as: ELIQUIS  Take 2.5 mg by mouth every 12 (twelve) hours Last time this was given: 2.5 mg on April 11, 2024 11:07 AM   azelastine  137 mcg nasal spray Quantity: 30 mL Refills: 11  Commonly known as: ASTELIN  ONE SPRAY INTO BOTH NOSTRILS TWICE DAILY Doctor's comments: PT REQUEST REFILLS Last time this was given: 1 spray on April 11, 2024 11:17 AM   calcitRIOL  0.5 MCG capsule Refills: 0  Commonly known as: ROCALTROL  Take 0.5 mcg by mouth 3 (three) times a day Last time this was given: 0.5 mcg on April 11, 2024  3:21 PM   esomeprazole  40 MG DR capsule Quantity: 90 capsule Refills: 1  Commonly known as: NexIUM  TAKE 1 CAPSULE (40 MG) BY MOUTH EVERY DAY   fluticasone  propionate 50 mcg/actuation nasal spray Quantity: 16 g Refills: 2  Commonly known as: FLONASE  TAKE 2 SPRAYS INTO EACH NOSTRIL EVERY DAY Doctor's comments: PT REQUEST REFILLS PLEASE Last time this was given: 2 sprays on April 11, 2024 11:16 AM   levothyroxine  150 MCG tablet Quantity: 90 tablet Refills: 11  Commonly known as: SYNTHROID  TAKE 1 TABLET BY MOUTH ONCE DAILY. TAKE ON AN EMPTY STOMACH WITH A GLASS OF WATER AT LEAST 30-60 MINUTES BEFORE BREAKFAST Doctor's comments: PT. REQUESTING REFILLS. THANKS. IF DENIED PLEASE CONTACT PT. Last time this was given: 150 mcg on April 11, 2024  6:00 AM   miscellaneous medical supply Misc Quantity: 1  each Refills: 0  Tub transfer bench   montelukast  10 mg tablet Quantity: 90 tablet Refills: 1  Commonly known as: SINGULAIR  take 1 tablet by mouth nightly Doctor's comments: NEED REFILL Last time this was given: 10 mg on April 10, 2024  8:52 PM   potassium chloride  10 MEQ ER tablet Quantity: 30 tablet Refills: 5  Commonly known as: KLOR-CON  Take 1 tablet (10 mEq total) by mouth once daily Last time this was given: Ask your nurse or doctor   rosuvastatin  40 MG tablet Quantity: 90 tablet Refills: 3  Commonly known as: CRESTOR  Take 1 tablet (40 mg total) by mouth every evening   sertraline  100 MG tablet Quantity: 90 tablet Refills: 1  Commonly known as: ZOLOFT  take 1 tablet by mouth daily Last time this was given: 100 mg on April 11, 2024 11:07 AM       ASK your doctor about these medications      Instructions  BREO ELLIPTA  200-25 mcg/dose Dsdv Refills: 0 Generic drug: fluticasone  furoate-vilanteroL  Inhale 2 inhalations into the lungs once daily  ferrous sulfate 325 (65 FE) MG tablet Refills: 0  Take 325 mg by mouth daily with breakfast   folic acid  1 MG tablet Refills: 0  Commonly known as: FOLVITE  Take 1 mg by mouth once daily   FUROsemide  40 MG tablet Refills: 0  Commonly known as: LASIX  Take 40 mg by mouth 2 (two) times daily   hydrALAZINE  100 MG tablet Quantity: 90 tablet Refills: 0 Ask about: Which instructions should I use?  Commonly known as: APRESOLINE  Take 1 tablet (100 mg total) by mouth 3 (three) times daily Last time this was given: Ask your nurse or doctor   hydrALAZINE  100 MG tablet Refills: 0 Ask about: Which instructions should I use?  Commonly known as: APRESOLINE  Take 100 mg by mouth 3 (three) times daily Last time this was given: Ask your nurse or doctor   IMODIUM  A-D ORAL Refills: 0  Take 1-2 tablets by mouth as needed   ipratropium-albuteroL  nebulizer solution Refills: 0  Commonly known as: DUO-NEB Take 3 mLs by  nebulization 4 (four) times daily as needed for Wheezing Last time this was given: 3 mLs on April 11, 2024 11:52 AM   MULTIVITAMIN ORAL Refills: 0  Take 1 tablet by mouth once daily   sennosides-docusate 8.6-50 mg tablet Refills: 0  Commonly known as: SENOKOT-S Take 2 tablets by mouth as needed for Constipation   vitamin B-12 500 MCG tablet Refills: 0 Generic drug: cyanocobalamin   Take 500 mcg by mouth once daily         Brief History of Present Illness:  [name] is a 83 y.o. female with PMH COPD (not on home O2), HFpEF (EF 55% on 03/2024), DM2 (last A1c 5.4% 02/21/24), HTN, OSA on CPAP, ESRD on HD MWF, L AV fistula fistula malfunction who was seen today by [facility] to address her fistula but was sent to ED for progressive SOB concerning for fluid overload.  She notes having chronic DOE for 3 years but with worsening orthopnea over last couple days. Couldn't sleep the last two night and had to sit upright. Her home pulse oximeter was reading 83%. Endorses chronic dry cough and wheezing over the last few days not improved with increasing her nebulizer use from two to six times daily. Denies SOB at rest currently. No fever/chills, URI symptoms, chest pain, abdominal complaints, leg swelling. Had some looser stools this morning. Of note, last dialysis session Monday 7/7 was stopped an hour early due to cramping and was given fluids. She tells me pre-maturely stopping dialysis happens most times. She still urinates well and takes lasix  40mg  twice daily at home. _____________________   Hospital Course by Problem:  83 y.o. female with PMH COPD (not on home O2), HFpEF (EF 55% on 03/2024), DM2 (last A1c 5.4% 02/21/24), HTN, OSA on CPAP, ESRD on HD MWF, L AV fistula fistula malfunction who was seen today by [facility] to address her fistula but was sent to ED for progressive SOB concerning for fluid overload.   # Acute respiratory failure with  hypoxia  # Acute on chronic HFpEF exacerbation  Clinical and laboratory evidence of volume overload. Had outside echocardiogram on 03/11/24 that showed EF of 55 to 60%, no regional wall motion abnormalities and normal RV systolic function. Echo here similar.  - Admission CXR noting pulmonary edema - O2 sats reportedly 83% at home prior to presentation, currently on 2L O2 to goal sats >93% - She takes home lasix  40mg  twice daily,  will switch to torsemide  40mg  daily per nephrology - HD for volume removal - Continue home lopressor  - Further GDMT limited by ESRD  # ESRD on HD MWF via L IJ PC Currently using PermCath for current HD sessions.  - Unclear dry weight but weighed 160 lbs in 09/2023, currently admission weight is 166 lbs - She makes good urine so will restart diuretic here - Nephrology consulted for HD tomorrow and may need additional sessions - Hold antihypertensives on HD days given history of hypotension after dialysis - Continue calcitriol    # LUE AV fistula malfunction   Per 12/21/23 note from [facility], patient already had a fistula in place in the preparation as outpatient, however, it malfunctioned so vascular performed angioplasty and stent placement. Seen today by [facility] for non maturing left arm fistula and marked site to assist with continued cannulation attempts.  - Per vascular note, follow up with vascular in 2-3 weeks as outpt.  - fistulogram 7/10 with formal result pending, but per pt no indication necessary.    # COPD  Patient with recent outside PFTs, with FEV1/FVC ratio normal, FEV1 and FVC normal. She had no significant bronchodilator response. She did however note improvement in her symptoms with broncho dilators. Her pulmonologist felt she could have mild obstructive airway disease. Currently without productive cough or wheezing to suggest a COPD flare. CXR more consistent with pulmonary edema from volume overload in CHF and ESRD. - Continue  home bronchodilators (advair) and prn albuterol  - PRN duonebs  - No indication for steroids at this time   # Essential hypertension - Continue amlodipine , hydralazine , lopressor  - c/w Torsemide  40mg  daily per neph   # Paroxysmal atrial fibrillation  In sinus rhythm.  - Continue lopressor  and renally dosed eliquis    # Obstructive sleep apnea - Resume nightly CPAP - Endorses CPAP compliance at home, asks about getting o2 at home to use at night. O2 dropped as low as 75% overnight, will rx home o2 to be given with cpap at night.   # Normocytic anemia  No obvious evidence of bleeding. Hemoglobin 8.4 on arrival with baseline ranging from 9-10. 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  - Check anemia panel   - Epo per neph  # Dyslipidemia - Continue statin therapy with crestor     # Type 2 Diabetes - Outside Ha1C in May was 5.4%, not on diabetic medications - SSI and POCT glucoses ACHS - Hypoglycemia protocol  # Hypothyroidism - Continue synthroid  - Last TSH normal in 2024  # GERD without esophagitis - Continue PPI   # Gout - No flares - Continue home allopurinol    # Depression - Stable on zoloft   # Hypokalemia - Monitor and supplement PRN    # Allergic rhinitis - Continue home nasal spray and singulair        Surgeries and Procedures Performed:  IR fistulogram as below  _____________________  Discharge Exam:  BP (!) 153/53 (BP Location: Right upper arm, Patient Position: Lying)   Pulse 60   Temp 36.4 C (97.6 F) (Oral)   Resp 18   Ht 157.5 cm (5' 2)   Wt 74 kg (163 lb 2.3 oz)   SpO2 96%   BMI 29.84 kg/m  O2 Device: Nasal cannula (04/10/24 2227) General: alert, cooperative, in NAD Eyes: conjunctiva clear, anicteric sclera HENT: oropharynx clear, moist mucous membranes Neck: JVD not elevated, no adenopathy, supple, symmetrical, trachea midline, and no thyromegaly CV: regular rate and rhythm, without murmurs, rubs or  gallops, systolic murmur LLSB Resp:  mild wet crackles at lung bases, no wheezing heard Abd: soft, nontender, nondistended, normoactive bowel sounds  Rectal: deferred Ext: no lower extremity edema, LUE AV fistula with thrill  Skin: no rashes or lesions Psych: oriented to time, place and person, mood and affect are appropriate Neuro: Grossly normal  Pertinent Lab Testing: Recent Labs  Lab 04/08/24 1629 04/09/24 0201 04/11/24 0635  NA 138 139 135  K 3.3* 3.1* 3.4*  CL 99 101 99  CO2 28 28 22   BUN 24* 31* 38*  CREATININE 2.7* 2.8* 3.4*  GLUCOSE 90 108 146*  CALCIUM  9.7 8.8 9.7   Recent Labs  Lab 04/08/24 1629  AST 24  ALT 19  ALKPHOS 67  TBILI 1.1    Recent Labs  Lab 04/08/24 1629 04/09/24 0201 04/11/24 0635  WBC 6.1 6.6 5.9  HGB 8.4* 8.1* 8.1*  HCT 26.0* 25.4* 25.7*  PLT 189 165 196   Recent Labs  Lab 04/08/24 1629  APTT 33.0  INR 1.3*     Other Pertinent Labs:  NA  Micro:  NA    Pertinent Imaging:  IR dialysis fistulagram Result Date: 04/10/2024 EXAM: IR dialysis fistulagram INDICATION: fluid overload Acute on chronic congestive heart failure, unspecified heart failure type (CMS/HHS-HCC) [I50.9 (ICD-10-CM)] ESRD (end stage renal disease) (CMS/HHS-HCC) [W81.3 (ICD-10-CM)] SEDATION / ANESTHESIA / MEDICATION: Level of sedation/anesthesia: Local Anesthesia Only Sedation / Anesthesia / Medication Administered:  iodixanoL  (VISIPAQUE ) 320 mg iodine /mL injection 7,040 mg - 22 mL (Totals for administrations occurring from 1108 to 1202 on 04/10/24) FLUOROSCOPY DOSE: Total fluoroscopy time: 00:42 minutes Dose area product: 4110 mGy-cm2 Air kerma: 10:80 mGy COMPLICATIONS: None immediate PREPROCEDURE: Informed written consent was obtained after a discussion of the risks, benefits, and alternatives to the procedure.  A Time-Out was performed immediately prior to the procedure. TECHNIQUE AND FINDINGS: The patient was positioned supine on the fluoroscopy table.  The access was prepped and draped in the usual  sterile fashion.  The fistula was palpated to determine the optimal access site.   Following local anesthetic, the fistula was accessed using a micropuncture kit.  The access was obtained in the antegrade direction and 4 French micropuncture sheath was placed.  Imaging was performed of the outflow circuit to the level of the right heart. Left upper arm access with a brachiocephalic configuration.  The cannulation segment is widely patent with no flow-limiting stenosis.  Unfortunately, the patient's undergone stenting of the distal cannulation zone.  The proximal and distal ends of the stent have been marked on the skin surface and should be avoided at the time of any future cannulation.  The stent measures approximately 5 cm in length.  The more central portion of the cephalic vein is patent.  The cephalic arch is patent.  There is a left IJ dialysis.  The left brachiocephalic vein and SVC are both widely patent. The sheath was removed and hemostasis was achieved with manual pressure.  The patient tolerated the procedure well.      Left upper arm brachiocephalic fistulogram.  The patient's undergone stenting of the cannulation segment.  The proximal and distal ends of the stent have been marked on the skin surface and ideally this segment would be avoided at the time of any future cannulation.  There is no flow-limiting stenosis requiring angioplasty. I, Lamar Levander Fluke, MD, attest that I personally performed the procedure.   Echo complete Result Date: 04/09/2024  [facility]                         [place]                                                                                    [facility]                                [id number]                                                                               DOB: 07-28-1941  Age: 65                  ECHO-DOPPLER REPORT                             Date: 04/09/2024                                                                                   Female                                                                                    Inpatient                                                                       LOCATION: [facility]  MD1: LAUREN LAE CHEN              STUDY: ECHO COMPLETE                             SOUND QLTY: Moderate                      ECHO: Yes                                           STRAIN: Yes                          COLOR: Yes                                               3D: No                         DOPPLER: Yes                                               BP: 147 / 63                 RV BIOPSY: No                                                HR: 74 BPM                    CONTRAST: No                                            Height: 62 in                       MEDIUM: N/A                                           Weight: 165 lbs                    MACHINE: DRH - EpiQ-2                                     BSA: 1.8                   ------------------------------------------------------------------------------------------     History: SOB      Reason: LV function  Indication: R06.02- Shortness of breath.  I50.9- Heart failure, unspecified (CMS/HHS-HCC).                                    CONCLUSION ------------------------------------------------------------------------------- NORMAL LEFT VENTRICULAR SYSTOLIC FUNCTION WITH MILD LVH ESTIMATED EF: >55%, CALC EF(2D): 62% ELEVATED LA PRESSURES WITH DIASTOLIC DYSFUNCTION (GRADE 2) NORMAL RIGHT VENTRICULAR SYSTOLIC FUNCTION VALVULAR REGURGITATION: No AR, MODERATE MR, No PR, MILD TR ESTIMATED RVSP: 47 mmHg NO VALVULAR STENOSIS Compared with prior Echo study on 10/13/2019 : MR HAS INCREASED FROM MILD TO MODERATE ECHOCARDIOGRAPHIC DESCRIPTIONS  ----------------------------------------------------------- AORTIC ROOT         Asc Ao Size: Normal                               Dissection: INDETERMINATE FOR DISSECTION                                                    Ao Arch Size: Normal                             Desc Ao Size: Normal                      AORTIC VALVE            Leaflets: Tricuspid                               Mobility: Fully Mobile                    Morphology: MILDLY THICKENED                                                                          AR: No AR                                         AS: No AS                              AV Mass: No Masses                   LEFT VENTRICLE                Size: Normal  LVH: MILD LVH CONCENTRIC                 Contraction: Normal                               Closest EF: >55%                                    Calc. EF: 62%(2D)                        LV GLS (TT): -18.7%                                                                     Strain Analysis: GLS < -18% Global longitudinal strain is normal.                                   LV Mass: No Masses                         Dias. FxClass: RELAXATION ABNORMALITY (GRADE 2) CORRESPONDS TO PSEUDONORMAL             WALL MOTION                      Basal             Mid               Apical              Anterior Septum: Normal            Normal            Normal                Anterior Wall: Normal            Normal            Normal                 Lateral Wall: Normal            Normal            Normal               Posterior Wall: Normal            Normal                                  Inferior Wall: Normal            Normal            Normal              Inferior Septum: Normal            Normal  MITRAL VALVE            Leaflets: ABNORMAL                                Mobility: Fully Mobile                    Morphology: ANNULAR CALC                                                                               MAC: MODERATE                                                         MR: MODERATE MR                                   MS: No MS                            MV masses: No Masses                   LEFT ATRIUM                Size: SEVERELY ENLARGED                                                                  LA masses: No Masses                   MAIN PA                Size: Normal                                  Diameter: 2.1 cm                 PULMONIC VALVE            Leaflets: UNKNOWN                                 Mobility: Fully Mobile                    Morphology: Not Well Visualized                                              PR: No PR  PS: No PS                            PV masses: Not Well Visualized         RIGHT VENTRICLE                Size: ENLARGED                               Free Wall: Normal                         Contraction: Normal                                                        TAPSE: 2 cm                                                      RV masses: No Masses                   TRICUSPID VALVE            Leaflets: Normal                                  Mobility: Fully Mobile                    Morphology: Normal                                       TR: MILD TR                                       TS: No TS                            TV masses: No Masses                   RIGHT ATRIUM                Size: ENLARGED                              RA masses: No Masses                   PERICARDIUM               Fluid: No Effusion        INFERIOR VENA CAVA                Size: DILATED  Max Diam: 2.2 cm                                  Min Diam: 0.8 cm                      Percent Change: 65 %                                                                       Resp.Collapse: Normal Respiratory Collapse                                           RESTING ECHOCARDIOGRAPHIC MEASUREMENTS --------------------------------------------------- AORTA Measurements            Values    Units     Normal Range                              Aorta Sin: 2.8       cm        [2.4 - 3.6]                               Asc.Aorta: 3.2       cm        [1.9 - 3.5]                          Asc. Aorta BSA: 1.8       cm/m2     [1.0 - 2.2]                  LEFT VENTRICLE                  LVIDd: 6.4       cm        [3.8 - 5.2]                                   LVIDs: 4.3       cm        [2.2 - 3.5]                               LVIDd/BSA: 3.6       cm/m2                                                     SWT: 1.2       cm        [0.6 - 0.9]  PWT: 1.2       cm        [0.6 - 0.9]                            LV EF MOD BP: 62        %                                               LV EDV MOD BP: 103       mL                                                     LVEDVi: 59        mL/m2     [29 - 61]                             LV ESV MOD BP: 33        mL                                                     LVESVi: 19        mL/m2     [8 - 24]                     DIASTOLIC FUNCTION          MV Pk. E Vel.: 129       cm/s                                            MV Pk. A Vel.: 103       cm/s                                                   MV E/A: 1.3                                                               MV DT: 194       msec                                           MV Med E' Vel.: 5.1       cm/s  MV Med E/e': 25.2                                                      MV Lat E'Vel.: 7.7       cm/s                                              MV Lat E/e': 16.7                                                        MV Avg E/e': 20.2      cm/s                                   LEFT ATRIUM                LA Diam: 6.3       cm        [2.7 - 3.8]                               LA Volume: 148       ml        [18 -  58]                                      LAVi: 84        ml/m2     [16 - 34]                    RIGHT VENTRICLE               RV TAPSE: 2         cm                                                 RV S' Vel.: 15.4      cm/s                                                  RV Base: 5         cm        [2.5 - 4.1]                                  RV Mid: 3.4       cm        [1.9 - 3.5]  RIGHT ATRIUM                RA Area: 24.2      cm2       [ <= 20]                                  RA Volume: 83        mL                                                       RAVi: 47        mL/m2     [15 - 27]                    INFERIOR VENA CAVA                Max.IVC: 2.2       cm        [ <= 2.1]                                   Min.IVC: 0.8       cm        [ <= 1.7]                            Percent Change: 65        %                                      Pressures, Gradients, and DOPPLER ECHO --------------------------------------------------- Aortic Valve            AV Pk. Vel.: 2.1       m/s                                              AV Pk. Grad.: 18        mmHg                                             AV Mn. Grad.: 11        mmHg                                                   AV VTI: 47.6      cm                                     Mitral Valve                MVA PHT: 3.9       cm2  by DOPPLER                            MV Pk. E Vel.: 129       cm/s                                            MV Pk. A Vel.: 103       cm/s                                                   MV E/A: 1.3                                                    MV Inflow E Vel.: 129       cm/s                                        MV Annulus E'Vel.: 5.1       cm/s                                                E/E'Ratio: 25                                               Mitral Regurgitation Values            MR Pk. Vel.: 5.8       m/s                                                    MR VTI: 181       cm                                              MR Radius PISA: 1         cm        by DOPPLER                         MR Aliasing Vel.: 0.31      m/s                                                   MR PISA: 6.3  cm2                                              MR RVol PISA: 61        mL                                                     MR ERO: 33.7      mm2                                    Tricuspid Regurgitation Values            TR Pk. Vel.: 3.1       m/s                                               RA Pressure: 8         mmHg                                                     RVSP: 47        mmHg      Peak                                Perform By: Franky Ohara, RDCS                                                          Res. Person: Franky Ohara, RDCS                                                    Electronically signed by Ozell JONELLE Barrier, M.D. on:04/09/2024 12:30:39 PM with status of Final The images are stored in the [facility] system, please contact the clinical provider for images related to this study.   X-ray chest single view portable Result Date: 04/08/2024 EXAM: Portable chest 16:09 hours on 04/08/2024 INDICATION:  Heart Size; Lung Aeration  Coded Diagnosis: Dx: Shortness of breath [R06.02 (ICD-10-CM)] . COMPARISON:  none TECHNIQUE: AP portable view of the chest was performed. FINDINGS: Frontal view of the chest is notable for a left jugular dual-lumen large bore hemodialysis type catheter with offset catheter tips projecting in the region inferior SVC and SVC, right atrial junction. No pneumothorax. Suggestion of trace pleural effusions blunting the costophrenic angles bilaterally. Mild vascular prominence, equalization, but a by fairly marked cardiomegaly. The lung fields are otherwise clear.  .  No acute bony abnormalities  are seen in this single view. IMPRESSION: Findings felt most consistent with mild or borderline CHF, pulmonary edema as above. Electronically Signed by:  Mark Knelson, MD, [facility] Radiology  Electronically Signed on:  04/08/2024 4:28 PM    ____________________  Code Status: Full Code Goals of care were not addressed during this admission.   Status on Discharge:  Current activity: Walks frequently (04/11/24 1100) Current mobility: No limitation (04/11/24 1100)  Activity Recommendation: activity as tolerated  Other Discharge Instructions: Services setup at discharge: Home Health PT/OT Tubes/lines at discharge: Central dialysis line  Diet: Diet renal  Wound Care Order Instructions     None       _____________________  Time spent on discharge process: 35 minutes    JOSHUA BETTYANN EVES, MD [facility]  04/11/2024   Hospital Contact Information:  [facility]) [facility]) [facility])  Pending tests:  Laboratory: [phone] Microbiology: [phone] Pathology: [phone] Radiology: [phone]  General questions: [phone] Pending tests: Laboratory: [phone] Microbiology: [phone] Pathology: [phone] Radiology: [phone]  General questions:  [phone] Pending tests:  Laboratory: [phone] Microbiology: [phone] Pathology: [phone] Radiology: [phone]  General questions:  [phone]

## 2024-04-14 ENCOUNTER — Telehealth: Payer: Self-pay

## 2024-04-14 NOTE — Progress Notes (Signed)
 TRANSITIONAL CARE TELEPHONE ENCOUNTER NOTE   [name] is a 83 y.o. female who was contacted after a hospitalization at [facility] on 04/11/2024 for volume overload:  The following were reviewed with the patient today:   Note: Only barriers and interventions are noted below. If no flowsheet data is found, then no issues were identified or the patient was not able to be reached.    Patient Knowledge and Self Care      04/14/2024    1:11 PM  Patient Knowledge and Self-Care  Did the patient/caregiver understand the primary reason for hospitalization? Yes  Is the patient or the patient's caregiver able to describe what is necessary for self care at this time? Yes  Are there any additional educational needs identified today? No     Discharge Follow-Up Appointment      04/14/2024    1:11 PM  Appointments  Does the patient have an appropriate follow-up visit scheduled within 14 days of discharge? Yes  Additional Comments patient unaware of date, her niece made the appointment.  Are there any barriers to the patient keeping their appointment? No  Has the patient been hospitalized more than once in the past 30 days? No    Financial Resource Strain: Medium Risk (04/14/2024)   Overall Financial Resource Strain (CARDIA)   . Difficulty of Paying Living Expenses: Somewhat hard    Transportation Needs: Unmet Transportation Needs (04/14/2024)   PRAPARE - Transportation   . Lack of Transportation (Medical): Yes   . Lack of Transportation (Non-Medical): Yes    Food Insecurity: No Food Insecurity (04/14/2024)   Hunger Vital Sign   . Worried About Programme researcher, broadcasting/film/video in the Last Year: Never true   . Ran Out of Food in the Last Year: Never true      Medication Review      04/14/2024    1:11 PM  Medication Review  Were any new medicines prescribed at discharge? Yes  Was the patient able to get all of their prescriptions? Yes  Does the patient understand their medications  including how to take them? Yes  Were there any medication interventions/escalations? No  Reconciled current and discharge medications? (RN and Pharmacist ONLY) No      New or Worsening Health Issues      04/14/2024    1:11 PM  New or Worsening Health Issues  Is the patient experiencing any new or worsening symptoms after discharge? No      Home Health       04/14/2024    1:11 PM  Home Health  Has your home care agency contacted you? Yes  Has your home care professional visited you after discharge? Yes    Is patient eligible for TCM coding?:     04/14/2024    1:11 PM  [facility] Coding Eligibility  Did you communicate with the patient and/or caregiver within 2 business days of discharge OR Did you document two or more separate communication attempts within 2 business days of discharge? Yes  Is the patient eligible for TCM billing? Yes - (patient is eligible for TCM coding)    The importance of keeping the appointment was emphasized to her.  Future Appointments   This patient does not currently have any appointments scheduled.   Patient identified by DOB and name. CM spoke with patient, she states she is ok, a little weak but ok. Patient states her breathing is a little bit better. Patient did not have dialysis today as the  location was closed due to no water or power; patient is scheduled to have dialysis tomorrow at a different location. Patient states her niece has scheduled a PCP follow-up appointment but she does not remember the date. Patient states her niece and nephew prepare her medications for her, she was able to pick up new prescriptions and she had no questions about the mediations. Patient had no questions/concerns.  MONICA PATTERSON, RN

## 2024-04-14 NOTE — Patient Instructions (Signed)
 Visit Information  Thank you for taking time to visit with me today. Please don't hesitate to contact me if I can be of assistance to you before our next scheduled telephone appointment.  Our next appointment is by telephone on April 24, 2024 at 10:30 am  Following is a copy of your care plan:   Goals Addressed             This Visit's Progress    VBCI Transitions of Care (TOC) Care Plan       Problems:  Recent Hospitalization for treatment of ESRD SDOH barrier: would like transportation from dialysis when she is weak states niece is planning to take her to HD center in [facility] and Patient was unable to have HD today as the center did not have water, will go to [facility] facility tomorrow for dialysis.  HD this week is T, W, and Friday she states.  Goal:  Over the next 30 days, the patient will not experience hospital readmission  Interventions:    Chronic Kidney Disease Interventions: Evaluation of current treatment plan related to chronic kidney disease self management and patient's adherence to plan as established by provider      Reviewed medications with patient and discussed importance of compliance    Assessed social determinant of health barriers    Last practice recorded BP readings:  BP Readings from Last 3 Encounters:  03/27/24 (!) 145/48  03/12/24 (!) 128/42  03/05/24 (!) 160/66   Most recent eGFR/CrCl:  Lab Results  Component Value Date   EGFR 18 (L) 02/21/2024    No components found for: CRCL  Patient Self Care Activities:  Attend all scheduled provider appointments Attend church or other social activities Notify RN Care Manager of [facility] call rescheduling needs Participate in Transition of Care Program/Attend [facility] scheduled calls Perform all self care activities independently  Take medications as prescribed    Plan:  Telephone follow up appointment with care management team member scheduled for:  April 24, 2024 at 10:30 am The care management team will reach  out to the patient again over the next 7-10 days days. The patient has been provided with contact information for the care management team and has been advised to call with any health related questions or concerns.         Patient verbalizes understanding of instructions and care plan provided today and agrees to view in MyChart. Active MyChart status and patient understanding of how to access instructions and care plan via MyChart confirmed with patient.     Telephone follow up appointment with care management team member scheduled for: The patient has been provided with contact information for the care management team and has been advised to call with any health related questions or concerns.  Next PCP appointment scheduled for:   Please call the care guide team at [phone] if you need to cancel or reschedule your appointment.   Please call the [place]  National Suicide Prevention Lifeline: (978) 059-3160 or TTY: (848) 195-2389 TTY (972)040-5222) to talk to a trained counselor if you are experiencing a Mental Health or Behavioral Health Crisis or need someone to talk to.  Richerd Fish, RN, BSN, CCM [facility], [facility] RN Care Manager Direct Dial: [phone]

## 2024-04-14 NOTE — Transitions of Care (Post Inpatient/ED Visit) (Signed)
 04/14/2024  Name: [name] MRN: [id number] DOB: 13-Apr-1941  Today's TOC FU Call Status: Today's TOC FU Call Status:: Successful TOC FU Call Completed TOC FU Call Complete Date: 04/14/24 Patient's Name and Date of Birth confirmed.  Transition Care Management Follow-up Telephone Call Date of Discharge: 04/14/24 Discharge Facility: Other [facility]) Name of Other ([facility]) Discharge Facility: [facility] Type of Discharge: Inpatient Admission Primary Inpatient Discharge Diagnosis:: Fluid overload dialysis How have you been since you were released from the hospital?: Better Any questions or concerns?: Yes  Items Reviewed: Did you receive and understand the discharge instructions provided?: Yes Medications obtained,verified, and reconciled?: Yes (Medications Reviewed) (Patient has new Torsemide , and Furosemide  on list, need to see if niece picked up Torsemide , patient doesn't really know she says, niece at hospital with her own son for surgery. Patient states she took Furosemide  (Lasix ), will follow up) Any new allergies since your discharge?: No Dietary orders reviewed?: Yes Type of Diet Ordered:: Renal, Heart health, Diabetic Do you have support at home?: Yes People in Home [RPT]: alone Name of Support/Comfort Primary Source: Niece is her POA she states  Medications Reviewed Today: Patient's niece Lucie manages her medications, discussed new medications with patient from [employer], niece not available Medications Reviewed Today     Reviewed by Eilleen Richerd GRADE, RN (Registered Nurse) on 04/14/24 at 1601  Med List Status: <None>   Medication Order Taking? Sig Documenting Provider Last Dose Status Informant  acetaminophen  (TYLENOL ) 500 MG tablet [id number]  Take 1,000 mg by mouth every 6 (six) hours as needed for mild pain or moderate pain. [provider]  Active Family Member, Pharmacy Records           Med Note [facility], [place] A   Mon Mar 10, 2024  3:18 PM) PRN  albuterol  (VENTOLIN  HFA) 108 (90 Base) MCG/ACT inhaler [id number]  Inhale 2 puffs into the lungs every 6 (six) hours as needed for wheezing or shortness of breath. Kasa, Kurian, MD  Active Family Member, Pharmacy Records           Med Note CATHY OVAL DEL   Dju Dec 22, 2023  1:38 PM) prn  allopurinol  (ZYLOPRIM ) 100 MG tablet [id number]  Take 100 mg by mouth daily. [provider]  Active Family Member, Pharmacy Records  amLODipine  (NORVASC ) 5 MG tablet [id number]  Take 1 tablet (5 mg total) by mouth daily. Marsa Edelman, DO  Active Family Member, Pharmacy Records  apixaban  (ELIQUIS ) 2.5 MG TABS tablet [id number]  Take 1 tablet (2.5 mg total) by mouth 2 (two) times daily. Von Bellis, MD  Active Family Member, Pharmacy Records  azelastine  (ASTELIN ) 0.1 % nasal spray [id number]  Place 2 sprays into both nostrils 2 (two) times daily. Use in each nostril as directed Tobie Eldora NOVAK, MD  Expired 03/27/24 2359 Family Member           Med Note CATHY OVAL DEL   Dju Dec 22, 2023  1:53 PM) prn  epoetin  alfa-epbx (RETACRIT ) 4000 UNIT/ML injection [id number]  Inject 1 mL (4,000 Units total) into the vein every Monday, Wednesday, and Friday at 6 PM. Awanda City, MD  Active Family Member, Pharmacy Records  esomeprazole  (NEXIUM ) 40 MG capsule [id number]  Take 1 capsule (40 mg total) by mouth daily before breakfast. Agrawal, Kavita, MD  Active Family Member, Pharmacy Records  ferrous sulfate 325 (65 FE) MG tablet [id number]  Take 325 mg by mouth daily with breakfast. [provider]  Active Family Member, Pharmacy Records  fluticasone  (FLONASE ) 50 MCG/ACT nasal spray [id number]  Place 2 sprays into both nostrils daily. Tobie Eldora NOVAK, MD  Expired 03/27/24 2359 Family Member           Med Note CATHY OVAL DEL   Dju Dec 22, 2023  1:51 PM) prn  fluticasone  furoate-vilanterol (BREO ELLIPTA ) 200-25 MCG/ACT AEPB [id number]  Inhale 1 puff into the lungs daily. Isaiah Scrivener,  MD  Active Family Member, Pharmacy Records  folic acid  (FOLVITE ) 1 MG tablet [id number]  Take 1 tablet (1 mg total) by mouth daily. Awanda City, MD  Active Family Member, Pharmacy Records  furosemide  (LASIX ) 40 MG tablet [id number]  TAKE ONE (1) TABLET BY MOUTH TWO TIMES PER DAY Darron Deatrice LABOR, MD  Active Family Member, Pharmacy Records  hydrALAZINE  (APRESOLINE ) 100 MG tablet [id number]  Take 1 tablet (100 mg total) by mouth 3 (three) times daily. Wouk, Devaughn Sayres, MD  Active Family Member, Pharmacy Records  ipratropium-albuterol  (DUONEB) 0.5-2.5 (3) MG/3ML SOLN [id number]  Take 3 mLs by nebulization every 6 (six) hours as needed (SOB). Kasa, Kurian, MD  Active Family Member, Pharmacy Records           Med Note CATHY OVAL DEL   Dju Dec 22, 2023  1:51 PM) prn  levothyroxine  (SYNTHROID ) 150 MCG tablet [id number]  Take 150 mcg by mouth every morning. [provider]  Active Family Member, Pharmacy Records  levothyroxine  (SYNTHROID ) 75 MCG tablet [id number]  Take 75 mcg by mouth daily.  Patient not taking: Reported on 04/14/2024   [provider]  Active Pharmacy Records, Family Member  lidocaine -prilocaine  (EMLA ) cream [id number]  as directed. Before dialysis [provider]  Active Family Member, Pharmacy Records  loperamide  (IMODIUM ) 2 MG capsule [id number]  Take 1 capsule (2 mg total) by mouth as needed for diarrhea or loose stools. Caleen Qualia, MD  Active Family Member, Pharmacy Records           Med Note CATHY OVAL DEL   Dju Dec 22, 2023  1:47 PM) prn  metoprolol  tartrate (LOPRESSOR ) 50 MG tablet [id number]  Take 25 mg by mouth 2 (two) times daily. [provider]  Active Family Member, Pharmacy Records           Med Note BEVERLEE CHARLIE Kitchens Jul 30, 2023  9:34 AM)    montelukast  (SINGULAIR ) 10 MG tablet [id number]  Take 1 tablet (10 mg total) by mouth at bedtime. Awanda City, MD  Active Family Member, Pharmacy Records  Multiple Minerals-Vitamins (CALCIUM   & VIT D3 BONE HEALTH PO) [id number]  Take 1 tablet by mouth daily. 600 mg/ 25 mg [provider]  Active Family Member, Pharmacy Records  nystatin powder [id number] Yes Apply 1 Application topically 3 (three) times daily. [provider]  Active   rosuvastatin  (CRESTOR ) 40 MG tablet [id number]  Take 40 mg by mouth daily. [provider]  Active Pharmacy Records, Family Member  senna-docusate (SENOKOT-S) 8.6-50 MG tablet [id number]  Take 2 tablets by mouth at bedtime as needed for mild constipation. Marsa Edelman, DO  Active Family Member, Pharmacy Records           Med Note CATHY OVAL DEL   Dju Dec 22, 2023  1:46 PM) prn  sertraline  (ZOLOFT ) 100 MG tablet [id number]  TAKE 1 TABLET BY MOUTH DAILY Ostwalt, Janna, PA-C  Active Family Member, Pharmacy Records  torsemide  (DEMADEX ) 20 MG tablet [id number] Yes Take 40 mg by mouth daily. [provider]  Active   vitamin B-12 (CYANOCOBALAMIN ) 500 MCG tablet [id number]  Take 500 mcg by mouth daily. [provider]  Active Family Member, Pharmacy Records  Med List Note Cathy Oval DEL, CPhT 12/22/23 1354): Patient's nasal sprays and inhalers were verified with Amy at [facility]. [phone]            Home Care and Equipment/Supplies: Cane, rolling walker with seat Were Home Health Services Ordered?: No Any new equipment or medical supplies ordered?: Yes Name of Medical supply agency?: [employer] Were you able to get the equipment/medical supplies?: Yes (oxygen deivered) Do you have any questions related to the use of the equipment/supplies?: No (She uses the oxygen at night and her CPAP)  PCP Follow-up appointment confirmed?: Yes Date of PCP follow-up appointment?: 04/22/24 Follow-up Provider: Jolynn Spencer, PA Specialist Hospital Follow-up appointment confirmed?: NA Do you need transportation to your follow-up appointment?: No (I would like help with my dialysis transportation, my  nieces take me and I drive sometimes but sometimes I feel weak when leaving the HD center. Spoke with the Child psychotherapist at the center and she said she was working on it.) Do you understand care options if your condition(s) worsen?: Yes-patient verbalized understanding  SDOH Interventions Today    Flowsheet Row Most Recent Value  SDOH Interventions   Food Insecurity Interventions Intervention Not Indicated  Housing Interventions Intervention Not Indicated  Transportation Interventions AMB Referral  [Desires transporation assistance, states working with Child psychotherapist at dialysis center]  Utilities Interventions Intervention Not Indicated    Goals Addressed             This Visit's Progress    VBCI Transitions of Care (TOC) Care Plan       Problems:  Recent Hospitalization for treatment of ESRD SDOH barrier: would like transportation from dialysis when she is weak states niece is planning to take her to HD center in [facility] and Patient was unable to have HD today as the center did not have water, will go to [facility] facility tomorrow for dialysis.  HD this week is T, W, and Friday she states.  Goal:  Over the next 30 days, the patient will not experience hospital readmission  Interventions:    Chronic Kidney Disease Interventions: Evaluation of current treatment plan related to chronic kidney disease self management and patient's adherence to plan as established by provider      Reviewed medications with patient and discussed importance of compliance    Assessed social determinant of health barriers    Last practice recorded BP readings:  BP Readings from Last 3 Encounters:  03/27/24 (!) 145/48  03/12/24 (!) 128/42  03/05/24 (!) 160/66   Most recent eGFR/CrCl:  Lab Results  Component Value Date   EGFR 18 (L) 02/21/2024    No components found for: CRCL  Patient Self Care Activities:  Attend all scheduled provider appointments Attend church or other social activities Notify  RN Care Manager of [facility] call rescheduling needs Participate in Transition of Care Program/Attend [facility] scheduled calls Perform all self care activities independently  Take medications as prescribed    Plan:  Telephone follow up appointment with care management team member scheduled for:  April 24, 2024 at 10:30 am The care management team will reach out to the patient again over the next 7-10 days days. The patient has been provided with contact information for the care management team and has been advised to call with any health related questions or  concerns.         Richerd Fish, RN, BSN, CCM [facility], [facility] RN Care Manager Direct Dial: [phone]

## 2024-04-17 ENCOUNTER — Encounter: Payer: Self-pay | Admitting: Physician Assistant

## 2024-04-21 ENCOUNTER — Other Ambulatory Visit
Admission: RE | Admit: 2024-04-21 | Discharge: 2024-04-21 | Disposition: A | Discharge status: Home / Self Care | Source: Ambulatory Visit | Attending: Nephrology | Admitting: Nephrology

## 2024-04-21 DIAGNOSIS — N186 End stage renal disease: Secondary | ICD-10-CM | POA: Diagnosis present

## 2024-04-21 LAB — HEMOGLOBIN AND HEMATOCRIT, BLOOD
HCT: 23.4 % — ABNORMAL LOW (ref 36.0–46.0)
Hemoglobin: 7.6 g/dL — ABNORMAL LOW (ref 12.0–15.0)

## 2024-04-22 ENCOUNTER — Inpatient Hospital Stay: Admitting: Physician Assistant

## 2024-04-23 ENCOUNTER — Other Ambulatory Visit: Payer: Self-pay | Admitting: Internal Medicine

## 2024-04-23 DIAGNOSIS — J449 Chronic obstructive pulmonary disease, unspecified: Secondary | ICD-10-CM

## 2024-04-24 ENCOUNTER — Other Ambulatory Visit: Payer: Self-pay

## 2024-04-24 ENCOUNTER — Emergency Department

## 2024-04-24 ENCOUNTER — Telehealth: Payer: Self-pay

## 2024-04-24 ENCOUNTER — Encounter: Payer: Self-pay | Admitting: Emergency Medicine

## 2024-04-24 ENCOUNTER — Inpatient Hospital Stay
Admission: RE | Admit: 2024-04-24 | Discharge: 2024-05-01 | DRG: 291 | Disposition: A | Discharge status: Home / Self Care | Attending: Internal Medicine | Admitting: Internal Medicine

## 2024-04-24 DIAGNOSIS — N186 End stage renal disease: Secondary | ICD-10-CM | POA: Diagnosis present

## 2024-04-24 DIAGNOSIS — Z888 Allergy status to other drugs, medicaments and biological substances status: Secondary | ICD-10-CM

## 2024-04-24 DIAGNOSIS — Z79890 Hormone replacement therapy (postmenopausal): Secondary | ICD-10-CM

## 2024-04-24 DIAGNOSIS — Z7901 Long term (current) use of anticoagulants: Secondary | ICD-10-CM

## 2024-04-24 DIAGNOSIS — Z7951 Long term (current) use of inhaled steroids: Secondary | ICD-10-CM

## 2024-04-24 DIAGNOSIS — Z85828 Personal history of other malignant neoplasm of skin: Secondary | ICD-10-CM

## 2024-04-24 DIAGNOSIS — R0600 Dyspnea, unspecified: Secondary | ICD-10-CM | POA: Diagnosis present

## 2024-04-24 DIAGNOSIS — E785 Hyperlipidemia, unspecified: Secondary | ICD-10-CM | POA: Diagnosis present

## 2024-04-24 DIAGNOSIS — Z841 Family history of disorders of kidney and ureter: Secondary | ICD-10-CM

## 2024-04-24 DIAGNOSIS — E890 Postprocedural hypothyroidism: Secondary | ICD-10-CM | POA: Diagnosis present

## 2024-04-24 DIAGNOSIS — R0602 Shortness of breath: Principal | ICD-10-CM

## 2024-04-24 DIAGNOSIS — I132 Hypertensive heart and chronic kidney disease with heart failure and with stage 5 chronic kidney disease, or end stage renal disease: Secondary | ICD-10-CM | POA: Diagnosis not present

## 2024-04-24 DIAGNOSIS — J9601 Acute respiratory failure with hypoxia: Secondary | ICD-10-CM

## 2024-04-24 DIAGNOSIS — Z8249 Family history of ischemic heart disease and other diseases of the circulatory system: Secondary | ICD-10-CM

## 2024-04-24 DIAGNOSIS — D631 Anemia in chronic kidney disease: Secondary | ICD-10-CM | POA: Diagnosis present

## 2024-04-24 DIAGNOSIS — I509 Heart failure, unspecified: Secondary | ICD-10-CM

## 2024-04-24 DIAGNOSIS — Z91012 Allergy to eggs: Secondary | ICD-10-CM

## 2024-04-24 DIAGNOSIS — I5033 Acute on chronic diastolic (congestive) heart failure: Secondary | ICD-10-CM | POA: Diagnosis not present

## 2024-04-24 DIAGNOSIS — M109 Gout, unspecified: Secondary | ICD-10-CM | POA: Diagnosis present

## 2024-04-24 DIAGNOSIS — Z803 Family history of malignant neoplasm of breast: Secondary | ICD-10-CM

## 2024-04-24 DIAGNOSIS — Z9049 Acquired absence of other specified parts of digestive tract: Secondary | ICD-10-CM

## 2024-04-24 DIAGNOSIS — Z882 Allergy status to sulfonamides status: Secondary | ICD-10-CM

## 2024-04-24 DIAGNOSIS — E1122 Type 2 diabetes mellitus with diabetic chronic kidney disease: Secondary | ICD-10-CM | POA: Diagnosis present

## 2024-04-24 DIAGNOSIS — G4733 Obstructive sleep apnea (adult) (pediatric): Secondary | ICD-10-CM | POA: Diagnosis present

## 2024-04-24 DIAGNOSIS — M1990 Unspecified osteoarthritis, unspecified site: Secondary | ICD-10-CM | POA: Diagnosis present

## 2024-04-24 DIAGNOSIS — Z992 Dependence on renal dialysis: Secondary | ICD-10-CM

## 2024-04-24 DIAGNOSIS — Z79899 Other long term (current) drug therapy: Secondary | ICD-10-CM

## 2024-04-24 DIAGNOSIS — K7581 Nonalcoholic steatohepatitis (NASH): Secondary | ICD-10-CM | POA: Diagnosis present

## 2024-04-24 DIAGNOSIS — M810 Age-related osteoporosis without current pathological fracture: Secondary | ICD-10-CM | POA: Diagnosis present

## 2024-04-24 DIAGNOSIS — I959 Hypotension, unspecified: Secondary | ICD-10-CM | POA: Diagnosis not present

## 2024-04-24 DIAGNOSIS — R0902 Hypoxemia: Secondary | ICD-10-CM

## 2024-04-24 DIAGNOSIS — J9621 Acute and chronic respiratory failure with hypoxia: Secondary | ICD-10-CM | POA: Diagnosis present

## 2024-04-24 DIAGNOSIS — Z66 Do not resuscitate: Secondary | ICD-10-CM | POA: Diagnosis present

## 2024-04-24 DIAGNOSIS — J4489 Other specified chronic obstructive pulmonary disease: Secondary | ICD-10-CM | POA: Diagnosis present

## 2024-04-24 DIAGNOSIS — I480 Paroxysmal atrial fibrillation: Secondary | ICD-10-CM | POA: Diagnosis present

## 2024-04-24 LAB — CBC WITH DIFFERENTIAL/PLATELET
Abs Immature Granulocytes: 0.04 K/uL (ref 0.00–0.07)
Basophils Absolute: 0.1 K/uL (ref 0.0–0.1)
Basophils Relative: 1 %
Eosinophils Absolute: 1.5 K/uL — ABNORMAL HIGH (ref 0.0–0.5)
Eosinophils Relative: 13 %
HCT: 28.8 % — ABNORMAL LOW (ref 36.0–46.0)
Hemoglobin: 9.2 g/dL — ABNORMAL LOW (ref 12.0–15.0)
Immature Granulocytes: 0 %
Lymphocytes Relative: 42 %
Lymphs Abs: 5 K/uL — ABNORMAL HIGH (ref 0.7–4.0)
MCH: 30.1 pg (ref 26.0–34.0)
MCHC: 31.9 g/dL (ref 30.0–36.0)
MCV: 94.1 fL (ref 80.0–100.0)
Monocytes Absolute: 0.7 K/uL (ref 0.1–1.0)
Monocytes Relative: 6 %
Neutro Abs: 4.5 K/uL (ref 1.7–7.7)
Neutrophils Relative %: 38 %
Platelets: 207 K/uL (ref 150–400)
RBC: 3.06 MIL/uL — ABNORMAL LOW (ref 3.87–5.11)
RDW: 15.3 % (ref 11.5–15.5)
WBC: 11.8 K/uL — ABNORMAL HIGH (ref 4.0–10.5)
nRBC: 0 % (ref 0.0–0.2)

## 2024-04-24 LAB — BLOOD GAS, VENOUS
Acid-Base Excess: 6.9 mmol/L — ABNORMAL HIGH (ref 0.0–2.0)
Bicarbonate: 33 mmol/L — ABNORMAL HIGH (ref 20.0–28.0)
O2 Saturation: 91.3 %
Patient temperature: 37
pCO2, Ven: 52 mmHg (ref 44–60)
pH, Ven: 7.41 (ref 7.25–7.43)
pO2, Ven: 59 mmHg — ABNORMAL HIGH (ref 32–45)

## 2024-04-24 LAB — COMPREHENSIVE METABOLIC PANEL WITH GFR
ALT: 19 U/L (ref 0–44)
AST: 34 U/L (ref 15–41)
Albumin: 3.8 g/dL (ref 3.5–5.0)
Alkaline Phosphatase: 93 U/L (ref 38–126)
Anion gap: 15 (ref 5–15)
BUN: 17 mg/dL (ref 8–23)
CO2: 27 mmol/L (ref 22–32)
Calcium: 9 mg/dL (ref 8.9–10.3)
Chloride: 95 mmol/L — ABNORMAL LOW (ref 98–111)
Creatinine, Ser: 2.18 mg/dL — ABNORMAL HIGH (ref 0.44–1.00)
GFR, Estimated: 22 mL/min — ABNORMAL LOW (ref >60)
Glucose, Bld: 171 mg/dL — ABNORMAL HIGH (ref 70–99)
Potassium: 3.5 mmol/L (ref 3.5–5.1)
Sodium: 137 mmol/L (ref 135–145)
Total Bilirubin: 0.9 mg/dL (ref 0.0–1.2)
Total Protein: 6.9 g/dL (ref 6.5–8.1)

## 2024-04-24 LAB — TSH: TSH: 2.468 u[IU]/mL (ref 0.350–4.500)

## 2024-04-24 LAB — MAGNESIUM: Magnesium: 1.8 mg/dL (ref 1.7–2.4)

## 2024-04-24 LAB — TROPONIN I (HIGH SENSITIVITY)
Troponin I (High Sensitivity): 34 ng/L — ABNORMAL HIGH (ref <18)
Troponin I (High Sensitivity): 67 ng/L — ABNORMAL HIGH (ref <18)

## 2024-04-24 LAB — PHOSPHORUS: Phosphorus: 3 mg/dL (ref 2.5–4.6)

## 2024-04-24 LAB — BRAIN NATRIURETIC PEPTIDE: B Natriuretic Peptide: 1057.8 pg/mL — ABNORMAL HIGH (ref 0.0–100.0)

## 2024-04-24 MED ORDER — APIXABAN 2.5 MG PO TABS
2.5000 mg | ORAL_TABLET | Freq: Two times a day (BID) | ORAL | Status: DC
  Administered 2024-04-24 – 2024-05-01 (×14): 2.5 mg via ORAL
  Filled 2024-04-24 (×15): qty 1

## 2024-04-24 MED ORDER — FLUTICASONE PROPIONATE 50 MCG/ACT NA SUSP
2.0000 | Freq: Every day | NASAL | Status: DC
  Administered 2024-04-26 – 2024-05-01 (×6): 2 via NASAL
  Filled 2024-04-24 (×2): qty 16

## 2024-04-24 MED ORDER — ALLOPURINOL 100 MG PO TABS
100.0000 mg | ORAL_TABLET | Freq: Every day | ORAL | Status: DC
  Administered 2024-04-24 – 2024-05-01 (×8): 100 mg via ORAL
  Filled 2024-04-24 (×8): qty 1

## 2024-04-24 MED ORDER — HYDRALAZINE HCL 20 MG/ML IJ SOLN: 5.0000 mg | Freq: Four times a day (QID) | INTRAMUSCULAR | Status: DC | PRN

## 2024-04-24 MED ORDER — AZELASTINE HCL 0.1 % NA SOLN
2.0000 | Freq: Two times a day (BID) | NASAL | Status: DC
  Administered 2024-04-24 – 2024-05-01 (×12): 2 via NASAL
  Filled 2024-04-24: qty 30

## 2024-04-24 MED ORDER — PANTOPRAZOLE SODIUM 40 MG PO TBEC
40.0000 mg | DELAYED_RELEASE_TABLET | Freq: Every day | ORAL | Status: DC
  Administered 2024-04-24 – 2024-05-01 (×8): 40 mg via ORAL
  Filled 2024-04-24 (×8): qty 1

## 2024-04-24 MED ORDER — AMLODIPINE BESYLATE 5 MG PO TABS
5.0000 mg | ORAL_TABLET | Freq: Every day | ORAL | Status: DC
Start: 2024-04-24 — End: 2024-05-01
  Administered 2024-04-24 – 2024-05-01 (×6): 5 mg via ORAL
  Filled 2024-04-24 (×6): qty 1

## 2024-04-24 MED ORDER — TORSEMIDE 20 MG PO TABS
40.0000 mg | ORAL_TABLET | Freq: Every day | ORAL | Status: DC
  Administered 2024-04-24 – 2024-05-01 (×7): 40 mg via ORAL
  Filled 2024-04-24 (×7): qty 2

## 2024-04-24 MED ORDER — ONDANSETRON HCL 4 MG/2ML IJ SOLN: 4.0000 mg | Freq: Four times a day (QID) | INTRAMUSCULAR | Status: DC | PRN

## 2024-04-24 MED ORDER — HYDRALAZINE HCL 50 MG PO TABS
100.0000 mg | ORAL_TABLET | Freq: Three times a day (TID) | ORAL | Status: DC
  Administered 2024-04-24 – 2024-04-26 (×4): 100 mg via ORAL
  Filled 2024-04-24 (×5): qty 2

## 2024-04-24 MED ORDER — METOPROLOL TARTRATE 25 MG PO TABS
25.0000 mg | ORAL_TABLET | Freq: Two times a day (BID) | ORAL | Status: DC
  Administered 2024-04-24 – 2024-05-01 (×10): 25 mg via ORAL
  Filled 2024-04-24 (×12): qty 1

## 2024-04-24 MED ORDER — SODIUM CHLORIDE 0.9% FLUSH
3.0000 mL | Freq: Two times a day (BID) | INTRAVENOUS | Status: DC
  Administered 2024-04-24 – 2024-05-01 (×14): 3 mL via INTRAVENOUS

## 2024-04-24 MED ORDER — FLUTICASONE FUROATE-VILANTEROL 200-25 MCG/ACT IN AEPB
1.0000 | INHALATION_SPRAY | Freq: Every day | RESPIRATORY_TRACT | Status: DC
  Administered 2024-04-26 – 2024-05-01 (×6): 1 via RESPIRATORY_TRACT
  Filled 2024-04-24: qty 28

## 2024-04-24 MED ORDER — IPRATROPIUM-ALBUTEROL 0.5-2.5 (3) MG/3ML IN SOLN
9.0000 mL | Freq: Once | RESPIRATORY_TRACT | Status: AC
  Administered 2024-04-24: 9 mL via RESPIRATORY_TRACT

## 2024-04-24 MED ORDER — LEVOTHYROXINE SODIUM 50 MCG PO TABS
150.0000 ug | ORAL_TABLET | Freq: Every morning | ORAL | Status: DC
  Administered 2024-04-25 – 2024-05-01 (×7): 150 ug via ORAL
  Filled 2024-04-24 (×7): qty 1

## 2024-04-24 MED ORDER — ACETAMINOPHEN 325 MG PO TABS
650.0000 mg | ORAL_TABLET | ORAL | Status: DC | PRN
  Administered 2024-04-27 – 2024-04-30 (×6): 650 mg via ORAL
  Filled 2024-04-24 (×6): qty 2

## 2024-04-24 MED ORDER — MONTELUKAST SODIUM 10 MG PO TABS
10.0000 mg | ORAL_TABLET | Freq: Every day | ORAL | Status: DC
  Administered 2024-04-24 – 2024-04-30 (×7): 10 mg via ORAL
  Filled 2024-04-24 (×7): qty 1

## 2024-04-24 MED ORDER — SERTRALINE HCL 50 MG PO TABS
100.0000 mg | ORAL_TABLET | Freq: Every day | ORAL | Status: DC
  Administered 2024-04-24 – 2024-05-01 (×8): 100 mg via ORAL
  Filled 2024-04-24 (×8): qty 2

## 2024-04-24 MED ORDER — ALBUTEROL SULFATE (2.5 MG/3ML) 0.083% IN NEBU
3.0000 mL | INHALATION_SOLUTION | Freq: Four times a day (QID) | RESPIRATORY_TRACT | Status: DC | PRN
  Administered 2024-04-25 – 2024-04-30 (×10): 3 mL via RESPIRATORY_TRACT
  Filled 2024-04-24 (×11): qty 3

## 2024-04-24 MED ORDER — SODIUM CHLORIDE 0.9 % IV SOLN: 250.0000 mL | INTRAVENOUS | Status: AC | PRN

## 2024-04-24 MED ORDER — SODIUM CHLORIDE 0.9% FLUSH: 3.0000 mL | INTRAVENOUS | Status: DC | PRN

## 2024-04-24 MED ORDER — ROSUVASTATIN CALCIUM 10 MG PO TABS
40.0000 mg | ORAL_TABLET | Freq: Every day | ORAL | Status: DC
  Administered 2024-04-24 – 2024-05-01 (×8): 40 mg via ORAL
  Filled 2024-04-24: qty 4
  Filled 2024-04-24: qty 2
  Filled 2024-04-24 (×3): qty 4
  Filled 2024-04-24: qty 2
  Filled 2024-04-24 (×2): qty 4
  Filled 2024-04-24: qty 2

## 2024-04-24 MED ORDER — CHLORHEXIDINE GLUCONATE CLOTH 2 % EX PADS
6.0000 | MEDICATED_PAD | Freq: Every day | CUTANEOUS | Status: DC
  Administered 2024-04-26 – 2024-05-01 (×8): 6 via TOPICAL
  Filled 2024-04-24 (×2): qty 6

## 2024-04-24 NOTE — Progress Notes (Signed)
 The patient during dialysis treatment and reported one episode of foot cramping.The goal 2 Liters was achieved, The patient was conscious ,alter with stable vital sings and received O2 4 l via nasal cannula.   04/24/24 1718  Vitals  Temp 98 F (36.7 C)  Temp Source Oral  BP (!) 159/60  BP Location Right Arm  BP Method Automatic  Patient Position (if appropriate) Lying  Resp 20  Weight 73.3 kg  Type of Weight Post-Dialysis  Oxygen Therapy  O2 Device Nasal Cannula  O2 Flow Rate (L/min) 4 L/min  During Treatment Monitoring  Intra-Hemodialysis Comments Tx completed  Post Treatment  Dialyzer Clearance Lightly streaked  Hemodialysis Intake (mL) 0 mL  Liters Processed 70  Fluid Removed (mL) 2100 mL  Tolerated HD Treatment Yes  Post-Hemodialysis Comments goal met.  Hemodialysis Catheter  No placement date or time found.   Placed prior to admission: Yes  Site Condition No complications  Blue Lumen Status Heparin  locked  Red Lumen Status Heparin  locked  Catheter fill solution Heparin  1000 units/ml  Catheter fill volume (Arterial) 2.2 cc  Catheter fill volume (Venous) 2.2  Dressing Type Transparent  Dressing Status Antimicrobial disc/dressing in place;Clean, Dry, Intact  Interventions New dressing;Dressing changed  Drainage Description None  Dressing Change Due 05/01/24  Post treatment catheter status Capped and Clamped

## 2024-04-24 NOTE — Progress Notes (Addendum)
 PT Cancellation Note  Patient Details Name: [name] MRN: [id number] DOB: [birth year]   Cancelled Treatment:    Reason Eval/Treat Not Completed: Patient at procedure or test/unavailable Pt out of room for dialysis.  Chart reviewed, will attempt to see when pt available and appropriate.  Carmin JONELLE Deed, DPT 04/24/2024, 1:56 PM

## 2024-04-24 NOTE — H&P (Signed)
 History and Physical    [name]:[id number] DOB: November 16, 1940 DOA: 04/24/2024  PCP: Ostwalt, Janna, PA-C (Confirm with patient/family/NH records and if not entered, this has to be entered at [facility] point of entry) Patient coming from: Home  I have personally briefly reviewed patient's old medical records in [facility]  Chief Complaint: SOB  HPI: [name] is a 83 y.o. 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  Patient reported that she has been having intermittent shortness of breath on nondialysis days since last week, since Tuesday she has been feeling increasing shortness of breath when ambulating compared to her baseline.  Yesterday after dialysis she was initially better however in the evening she started to feel shortness of breath again and could not lie flat at night and decided to come to the hospital this morning.  Denied any cough no chest pains.  No peripheral edema.  ED Course: Afebrile, nontachycardic blood pressure 178/64 Reflexions 100% on BiPAP.  Chest x-ray showed cardiomegaly and pulmonary congestion, blood work showed WBC 10.8 hemoglobin 9.2 BUN 17 creatinine 2.1 bicarb 27K 3.5, VBG 7 point 4/52/59.  Nephrology consulted for emergency dialysis.  Review of Systems: As per HPI otherwise 14 point review of systems negative.    Past Medical History:  Diagnosis Date   Actinic keratosis    Albuminuria    Anemia    Arthritis    Asthma    Basal cell carcinoma 04/12/2017   Above right lateral brow. Nodulocystic type. EDC   Cancer (HCC)    skin   Cataract cortical, senile    CHF (congestive heart failure) (HCC)    Diabetes mellitus without complication (HCC)    GERD (gastroesophageal reflux disease)    Hemorrhoids    History of kidney stones    Hyperlipidemia    Hypertension    Hypothyroidism    Lyme disease    No kidney function     OSA (obstructive sleep apnea)    Osteoporosis    Osteoporosis    Reflux esophagitis    Steatohepatitis    Steatohepatitis     Past Surgical History:  Procedure Laterality Date   A/V FISTULAGRAM N/A 12/31/2023   Procedure: A/V Fistulagram;  Surgeon: Marea Selinda RAMAN, MD;  Location: [facility] INVASIVE CV LAB;  Service: Cardiovascular;  Laterality: N/A;   ABDOMINAL HYSTERECTOMY     APPENDECTOMY     AV FISTULA PLACEMENT Left 05/19/2022   Procedure: ARTERIOVENOUS (AV) FISTULA CREATION ( BRACHIAL CEPHALIC );  Surgeon: Jama Cordella MATSU, MD;  Location: [facility] ORS;  Service: Vascular;  Laterality: Left;   CARDIAC CATHETERIZATION  1980   [facility]   CARDIAC CATHETERIZATION  08/13/2014   [facility]. no significant CAD, normal LVEDP.    CATARACT EXTRACTION     CHOLECYSTECTOMY     COLONOSCOPY     COLONOSCOPY WITH PROPOFOL  N/A 12/07/2016   Procedure: COLONOSCOPY WITH PROPOFOL ;  Surgeon: Gladis RAYMOND Mariner, MD;  Location: [facility] ENDOSCOPY;  Service: Endoscopy;  Laterality: N/A;   DIALYSIS/PERMA CATHETER INSERTION N/A 12/31/2023   Procedure: DIALYSIS/PERMA CATHETER INSERTION;  Surgeon: Marea Selinda RAMAN, MD;  Location: [facility] INVASIVE CV LAB;  Service: Cardiovascular;  Laterality: N/A;   ESOPHAGOGASTRODUODENOSCOPY     ESOPHAGOGASTRODUODENOSCOPY (EGD) WITH PROPOFOL  N/A 12/07/2016   Procedure: ESOPHAGOGASTRODUODENOSCOPY (EGD) WITH PROPOFOL ;  Surgeon: Gladis RAYMOND Mariner, MD;  Location: [facility] ENDOSCOPY;  Service: Endoscopy;  Laterality: N/A;   ESOPHAGOGASTRODUODENOSCOPY (EGD)  WITH PROPOFOL  N/A 01/07/2018   Procedure: ESOPHAGOGASTRODUODENOSCOPY (EGD) WITH PROPOFOL ;  Surgeon: Gaylyn Gladis PENNER, MD;  Location: [facility] ENDOSCOPY;  Service: Endoscopy;  Laterality: N/A;   EYE SURGERY     HEMATOMA EVACUATION Right 08/02/2023   Procedure: EVACUATION HEMATOMA;  Surgeon: Jordis Laneta FALCON, MD;  Location: [facility] ORS;  Service: General;  Laterality: Right;   HEMORRHOID SURGERY     PARTIAL HYSTERECTOMY     THYROIDECTOMY N/A 08/25/2019   Procedure:  THYROIDECTOMY EXTRACTION OF SUBTOTAL COMPONENT; PARATHYROID  AUTOTRANSPLANT X1;  Surgeon: Marolyn Nest, MD;  Location: [facility] ORS;  Service: General;  Laterality: N/A;  With Nerve Monitoring(RLN)   TONSILLECTOMY       reports that she has never smoked. She has never used smokeless tobacco. She reports that she does not drink alcohol and does not use drugs.  Allergies  Allergen Reactions   Ace Inhibitors     Other reaction(s): Unknown   Egg-Derived Products Diarrhea   Other     Other reaction(s): Other (See Comments) Eggs   Prednisone      Other reaction(s): Other (See Comments) joint pain   Risedronate     Other reaction(s): Other (See Comments)   Sulfa Antibiotics Itching and Swelling    Other reaction(s): Other (See Comments)   Sulfasalazine Other (See Comments)    Family History  Problem Relation Age of Onset   Cancer Mother        breast   Kidney disease Mother    Breast cancer Mother    Hypertension Father    Heart attack Father      Prior to Admission medications   Medication Sig Start Date End Date Taking? Authorizing Provider  acetaminophen  (TYLENOL ) 500 MG tablet Take 1,000 mg by mouth every 6 (six) hours as needed for mild pain or moderate pain.    [provider]  albuterol  (VENTOLIN  HFA) 108 (90 Base) MCG/ACT inhaler Inhale 2 puffs into the lungs every 6 (six) hours as needed for wheezing or shortness of breath. 11/14/23   Kasa, Kurian, MD  allopurinol  (ZYLOPRIM ) 100 MG tablet Take 100 mg by mouth daily. 09/17/23   [provider]  amLODipine  (NORVASC ) 5 MG tablet Take 1 tablet (5 mg total) by mouth daily. 08/06/23 08/05/24  Alexander, Natalie, DO  apixaban  (ELIQUIS ) 2.5 MG TABS tablet Take 1 tablet (2.5 mg total) by mouth 2 (two) times daily. 06/04/23 06/03/24  Von Bellis, MD  azelastine  (ASTELIN ) 0.1 % nasal spray Place 2 sprays into both nostrils 2 (two) times daily. Use in each nostril as directed 10/02/23 03/27/24  Tobie Comp B, MD  epoetin   alfa-epbx (RETACRIT ) 4000 UNIT/ML injection Inject 1 mL (4,000 Units total) into the vein every Monday, Wednesday, and Friday at 6 PM. 01/02/24   Awanda City, MD  esomeprazole  (NEXIUM ) 40 MG capsule Take 1 capsule (40 mg total) by mouth daily before breakfast. 11/27/23   Agrawal, Kavita, MD  ferrous sulfate 325 (65 FE) MG tablet Take 325 mg by mouth daily with breakfast.    [provider]  fluticasone  (FLONASE ) 50 MCG/ACT nasal spray Place 2 sprays into both nostrils daily. 10/02/23 03/27/24  Tobie Comp NOVAK, MD  fluticasone  furoate-vilanterol (BREO ELLIPTA ) 200-25 MCG/ACT AEPB Inhale 1 puff into the lungs daily. 11/14/23   Kasa, Kurian, MD  folic acid  (FOLVITE ) 1 MG tablet Take 1 tablet (1 mg total) by mouth daily. 01/01/24   Awanda City, MD  furosemide  (LASIX ) 40 MG tablet TAKE ONE (1) TABLET BY MOUTH TWO TIMES PER DAY  03/04/24   Darron Deatrice LABOR, MD  hydrALAZINE  (APRESOLINE ) 100 MG tablet Take 1 tablet (100 mg total) by mouth 3 (three) times daily. 04/22/23   Wouk, Devaughn Sayres, MD  ipratropium-albuterol  (DUONEB) 0.5-2.5 (3) MG/3ML SOLN INHALE THE CONTENTS OF ONE (1) VIAL VIA NEBULIZATION EVERY 6 HOURS AS NEEDED FORSHORTNESS OF BREATH 04/23/24   Kasa, Kurian, MD  levothyroxine  (SYNTHROID ) 150 MCG tablet Take 150 mcg by mouth every morning. 02/25/24   [provider]  levothyroxine  (SYNTHROID ) 75 MCG tablet Take 75 mcg by mouth daily. Patient not taking: Reported on 04/14/2024    [provider]  lidocaine -prilocaine  (EMLA ) cream as directed. Before dialysis 01/02/24   [provider]  loperamide  (IMODIUM ) 2 MG capsule Take 1 capsule (2 mg total) by mouth as needed for diarrhea or loose stools. 08/05/22   Amin, Sumayya, MD  metoprolol  tartrate (LOPRESSOR ) 50 MG tablet Take 25 mg by mouth 2 (two) times daily.    [provider]  montelukast  (SINGULAIR ) 10 MG tablet Take 1 tablet (10 mg total) by mouth at bedtime. 01/01/24   Awanda City, MD  Multiple Minerals-Vitamins (CALCIUM   & VIT D3 BONE HEALTH PO) Take 1 tablet by mouth daily. 600 mg/ 25 mg    [provider]  nystatin powder Apply 1 Application topically 3 (three) times daily.    [provider]  rosuvastatin  (CRESTOR ) 40 MG tablet Take 40 mg by mouth daily.    [provider]  senna-docusate (SENOKOT-S) 8.6-50 MG tablet Take 2 tablets by mouth at bedtime as needed for mild constipation. 08/06/23   Alexander, Natalie, DO  sertraline  (ZOLOFT ) 100 MG tablet TAKE 1 TABLET BY MOUTH DAILY 02/15/24   Ostwalt, Janna, PA-C  torsemide  (DEMADEX ) 20 MG tablet Take 40 mg by mouth daily.    [provider]  vitamin B-12 (CYANOCOBALAMIN ) 500 MCG tablet Take 500 mcg by mouth daily.    [provider]    Physical Exam: Vitals:   04/24/24 0631 04/24/24 0700 04/24/24 0730 04/24/24 0742  BP: (!) 172/64 (!) 162/53 (!) 153/50   Pulse: 78 69 62 63  Resp:  (!) 22 19 (!) 24  Temp:      TempSrc:      SpO2: 100% 99% 99% 100%  Weight:        Constitutional: NAD, calm, comfortable Vitals:   04/24/24 0631 04/24/24 0700 04/24/24 0730 04/24/24 0742  BP: (!) 172/64 (!) 162/53 (!) 153/50   Pulse: 78 69 62 63  Resp:  (!) 22 19 (!) 24  Temp:      TempSrc:      SpO2: 100% 99% 99% 100%  Weight:       Eyes: PERRL, lids and conjunctivae normal ENMT: Mucous membranes are moist. Posterior pharynx clear of any exudate or lesions.Normal dentition.  Neck: normal, supple, no masses, no thyromegaly Respiratory: clear to auscultation bilaterally, no wheezing, diffuse crackles bilaterally, increasing respiratory effort. No accessory muscle use.  Cardiovascular: Regular rate and rhythm, no murmurs / rubs / gallops. No extremity edema. 2+ pedal pulses. No carotid bruits.  Abdomen: no tenderness, no masses palpated. No hepatosplenomegaly. Bowel sounds positive.  Musculoskeletal: no clubbing / cyanosis. No joint deformity upper and lower extremities. Good ROM, no contractures. Normal muscle tone.  Skin:  no rashes, lesions, ulcers. No induration Neurologic: CN 2-12 grossly intact. Sensation intact, DTR normal. Strength 5/5 in all 4.  Psychiatric: Normal judgment and insight. Alert and oriented x 3. Normal mood.    Labs on  Admission: I have personally reviewed following labs and imaging studies  CBC: Recent Labs  Lab 04/21/24 1516 04/24/24 0633  WBC  --  11.8*  NEUTROABS  --  4.5  HGB 7.6* 9.2*  HCT 23.4* 28.8*  MCV  --  94.1  PLT  --  207   Basic Metabolic Panel: Recent Labs  Lab 04/24/24 0633  NA 137  K 3.5  CL 95*  CO2 27  GLUCOSE 171*  BUN 17  CREATININE 2.18*  CALCIUM  9.0  MG 1.8  PHOS 3.0   GFR: Estimated Creatinine Clearance: 18.6 mL/min (A) (by C-G formula based on SCr of 2.18 mg/dL (H)). Liver Function Tests: Recent Labs  Lab 04/24/24 0633  AST 34  ALT 19  ALKPHOS 93  BILITOT 0.9  PROT 6.9  ALBUMIN 3.8   No results for input(s): LIPASE, AMYLASE in the last 168 hours. No results for input(s): AMMONIA in the last 168 hours. Coagulation Profile: No results for input(s): INR, PROTIME in the last 168 hours. Cardiac Enzymes: No results for input(s): CKTOTAL, CKMB, CKMBINDEX, TROPONINI in the last 168 hours. BNP (last 3 results) No results for input(s): PROBNP in the last 8760 hours. HbA1C: No results for input(s): HGBA1C in the last 72 hours. CBG: No results for input(s): GLUCAP in the last 168 hours. Lipid Profile: No results for input(s): CHOL, HDL, LDLCALC, TRIG, CHOLHDL, LDLDIRECT in the last 72 hours. Thyroid  Function Tests: No results for input(s): TSH, T4TOTAL, FREET4, T3FREE, THYROIDAB in the last 72 hours. Anemia Panel: No results for input(s): VITAMINB12, FOLATE, FERRITIN, TIBC, IRON, RETICCTPCT in the last 72 hours. Urine analysis:    Component Value Date/Time   COLORURINE YELLOW (A) 09/24/2023 1050   APPEARANCEUR CLOUDY (A) 09/24/2023 1050   APPEARANCEUR Clear 09/17/2018 1028    LABSPEC 1.012 09/24/2023 1050   LABSPEC 1.013 06/20/2012 1508   PHURINE 5.0 09/24/2023 1050   GLUCOSEU NEGATIVE 09/24/2023 1050   GLUCOSEU Negative 06/20/2012 1508   HGBUR SMALL (A) 09/24/2023 1050   BILIRUBINUR NEGATIVE 09/24/2023 1050   BILIRUBINUR Negative 09/17/2018 1028   BILIRUBINUR Negative 06/20/2012 1508   KETONESUR NEGATIVE 09/24/2023 1050   PROTEINUR 100 (A) 09/24/2023 1050   NITRITE NEGATIVE 09/24/2023 1050   LEUKOCYTESUR LARGE (A) 09/24/2023 1050   LEUKOCYTESUR Negative 06/20/2012 1508    Radiological Exams on Admission: DG Chest Portable 1 View Result Date: 04/24/2024 CLINICAL DATA:  Hypoxia and shortness of breath. EXAM: PORTABLE CHEST 1 VIEW COMPARISON:  Portable chest 03/09/2024 FINDINGS: 6:51 a.m. Left IJ dialysis catheter with distal split lumens. The most distal lumen tip is just above the superior cavoatrial junction, as before. Moderate cardiomegaly. There is perihilar vascular congestion with mild generalized interstitial edema and small pleural effusions. There is linear atelectasis in both bases, left mid field. No focal airspace infiltrate is seen. Mediastinum is normally outlined. There is aortic atherosclerosis. No new osseous findings. There were similar findings on the prior study. Today the basilar atelectasis is less prominent than previously but no other change is seen in overall aeration. IMPRESSION: 1. Cardiomegaly with perihilar vascular congestion, mild generalized interstitial edema and small pleural effusions. 2. Bibasilar atelectasis, less prominent than previously. 3. Left IJ dialysis catheter with distal split lumens. 4. Aortic atherosclerosis. Electronically Signed   By: Francis Quam M.D.   On: 04/24/2024 07:28    EKG: Independently reviewed.  Sinus rhythm, no acute ST changes, prolonged QTc  Assessment/Plan Principal Problem:   CHF (congestive heart failure) (HCC) Active Problems:   Acute  dyspnea   Acute on chronic diastolic congestive  heart failure (HCC)  (please populate well all problems here in Problem List. (For example, if patient is on BP meds at home and you resume or decide to hold them, it is a problem that needs to be her. Same for CAD, COPD, HLD and so on)  Acute hypoxic respiratory failure Acute on chronic HFpEF decompensation - Nephrology arrange emergent dialysis this morning - Indication for better control of BP - Echocardiogram was done last month, will not repeat echo today.  HTN, uncontrolled - Resume home BP meds including metoprolol  torsemide  and amlodipine  - Add as needed hydralazine   Hypothyroidism - Check TSH - Consider reduce Synthroid  dosage, defer to PCP  COPD - There is no symptoms or signs to indicate a COPD flareup at this point -Continue as needed albuterol - -continue ICS and LABA  Deconditioning - PT evaluation  DVT prophylaxis: Heparin  subcu Code Status: DNR Family Communication: None at bedside Disposition Plan: Expect less than 2 midnight hospital stay Consults called: Nephrology Admission status: PCU observation   Cort ONEIDA Mana MD [facility] Pager [phone]  04/24/2024, 9:58 AM

## 2024-04-24 NOTE — Progress Notes (Signed)
 [facility]  ROUNDING NOTE   Subjective:   [name] is a 83 y.o. female with past medical conditions including diastolic heart failure, hypertension, hypothyroidism, COPD, and end stage renal disease on hemodialysis. Patient presents to the emergency department complaining of shortness of breath and has been admitted for CHF (congestive heart failure) (HCC) [I50.9]  Patient is known to our practice and receives outpatient dialysis treatments at [facility], on a MWF schedule, supervised by Dr Marcelino.  Last treatment received on Wednesday, tolerated full treatment.  Patient denies exceeding her fluid restriction.  Does state she went out to eat last night and ate a mushroom burger.  Awoke this morning with severe shortness of breath.  Currently seen in emergency department on stretcher, BiPAP in place.  Labs on ED arrival unremarkable for renal patient.  BNP greater than 1000.  White count 11.8 with hemoglobin 9.2.  Chest x-ray shows cardiomegaly with perihilar vascular congestion with mild interstitial edema.  We have been consulted to provide hemodialysis.    Objective:  Vital signs in last 24 hours:  Temp:  [97.4 F (36.3 C)-97.7 F (36.5 C)] 97.4 F (36.3 C) (07/24 1124) Pulse Rate:  [57-78] 65 (07/24 1328) Resp:  [17-30] 25 (07/24 1328) BP: (144-172)/(48-64) 158/62 (07/24 1328) SpO2:  [98 %-100 %] 99 % (07/24 1328) FiO2 (%):  [40 %-60 %] 40 % (07/24 0742) Weight:  [75.3 kg] 75.3 kg (07/24 0628)  Weight change:  Filed Weights   04/24/24 0628  Weight: 75.3 kg    Intake/Output: No intake/output data recorded.   Intake/Output this shift:  No intake/output data recorded.  Physical Exam: General: NAD, ill-appearing  Head: Normocephalic, atraumatic. Moist oral mucosal membranes  Eyes: Anicteric  Neck: Supple  Lungs:  Rales throughout  Heart: Regular rate and rhythm  Abdomen:  Soft, nontender  Extremities: No peripheral edema.  Neurologic: Awake,  alert, conversant  Skin: Warm,dry, no rash  Access: Left IJ PermCath, left upper aVF    Basic Metabolic Panel: Recent Labs  Lab 04/24/24 0633  NA 137  K 3.5  CL 95*  CO2 27  GLUCOSE 171*  BUN 17  CREATININE 2.18*  CALCIUM  9.0  MG 1.8  PHOS 3.0    Liver Function Tests: Recent Labs  Lab 04/24/24 0633  AST 34  ALT 19  ALKPHOS 93  BILITOT 0.9  PROT 6.9  ALBUMIN 3.8   No results for input(s): LIPASE, AMYLASE in the last 168 hours. No results for input(s): AMMONIA in the last 168 hours.  CBC: Recent Labs  Lab 04/21/24 1516 04/24/24 0633  WBC  --  11.8*  NEUTROABS  --  4.5  HGB 7.6* 9.2*  HCT 23.4* 28.8*  MCV  --  94.1  PLT  --  207    Cardiac Enzymes: No results for input(s): CKTOTAL, CKMB, CKMBINDEX, TROPONINI in the last 168 hours.  BNP: Invalid input(s): POCBNP  CBG: No results for input(s): GLUCAP in the last 168 hours.  Microbiology: Results for orders placed or performed during the hospital encounter of 03/09/24  Resp panel by RT-PCR (RSV, Flu A&B, Covid) Anterior Nasal Swab     Status: None   Collection Time: 03/09/24  7:18 PM   Specimen: Anterior Nasal Swab  Result Value Ref Range Status   SARS Coronavirus 2 by RT PCR NEGATIVE NEGATIVE Final    Comment: (NOTE) SARS-CoV-2 target nucleic acids are NOT DETECTED.  The SARS-CoV-2 RNA is generally detectable in upper respiratory specimens during the acute  phase of infection. The lowest concentration of SARS-CoV-2 viral copies this assay can detect is 138 copies/mL. A negative result does not preclude SARS-Cov-2 infection and should not be used as the sole basis for treatment or other patient management decisions. A negative result may occur with  improper specimen collection/handling, submission of specimen other than nasopharyngeal swab, presence of viral mutation(s) within the areas targeted by this assay, and inadequate number of viral copies(<138 copies/mL). A negative  result must be combined with clinical observations, patient history, and epidemiological information. The expected result is Negative.  Fact Sheet for Patients:  BloggerCourse.com  Fact Sheet for Healthcare Providers:  SeriousBroker.it  This test is no t yet approved or cleared by the [place]  FDA and  has been authorized for detection and/or diagnosis of SARS-CoV-2 by FDA under an Emergency Use Authorization (EUA). This EUA will remain  in effect (meaning this test can be used) for the duration of the COVID-19 declaration under Section 564(b)(1) of the Act, 21 U.S.C.section 360bbb-3(b)(1), unless the authorization is terminated  or revoked sooner.       Influenza A by PCR NEGATIVE NEGATIVE Final   Influenza B by PCR NEGATIVE NEGATIVE Final    Comment: (NOTE) The Xpert Xpress SARS-CoV-2/FLU/RSV plus assay is intended as an aid in the diagnosis of influenza from Nasopharyngeal swab specimens and should not be used as a sole basis for treatment. Nasal washings and aspirates are unacceptable for Xpert Xpress SARS-CoV-2/FLU/RSV testing.  Fact Sheet for Patients: BloggerCourse.com  Fact Sheet for Healthcare Providers: SeriousBroker.it  This test is not yet approved or cleared by the [place]  FDA and has been authorized for detection and/or diagnosis of SARS-CoV-2 by FDA under an Emergency Use Authorization (EUA). This EUA will remain in effect (meaning this test can be used) for the duration of the COVID-19 declaration under Section 564(b)(1) of the Act, 21 U.S.C. section 360bbb-3(b)(1), unless the authorization is terminated or revoked.     Resp Syncytial Virus by PCR NEGATIVE NEGATIVE Final    Comment: (NOTE) Fact Sheet for Patients: BloggerCourse.com  Fact Sheet for Healthcare Providers: SeriousBroker.it  This  test is not yet approved or cleared by the [place]  FDA and has been authorized for detection and/or diagnosis of SARS-CoV-2 by FDA under an Emergency Use Authorization (EUA). This EUA will remain in effect (meaning this test can be used) for the duration of the COVID-19 declaration under Section 564(b)(1) of the Act, 21 U.S.C. section 360bbb-3(b)(1), unless the authorization is terminated or revoked.  Performed at [facility], [address] [place], [place] [address]   Respiratory (~20 pathogens) panel by PCR     Status: None   Collection Time: 03/09/24 10:00 PM   Specimen: Nasopharyngeal Swab; Respiratory  Result Value Ref Range Status   Adenovirus NOT DETECTED NOT DETECTED Final   Coronavirus 229E NOT DETECTED NOT DETECTED Final    Comment: (NOTE) The Coronavirus on the Respiratory Panel, DOES NOT test for the novel  Coronavirus (2019 nCoV)    Coronavirus HKU1 NOT DETECTED NOT DETECTED Final   Coronavirus NL63 NOT DETECTED NOT DETECTED Final   Coronavirus OC43 NOT DETECTED NOT DETECTED Final   Metapneumovirus NOT DETECTED NOT DETECTED Final   Rhinovirus / Enterovirus NOT DETECTED NOT DETECTED Final   Influenza A NOT DETECTED NOT DETECTED Final   Influenza B NOT DETECTED NOT DETECTED Final   Parainfluenza Virus 1 NOT DETECTED NOT DETECTED Final   Parainfluenza Virus 2 NOT DETECTED NOT DETECTED Final  Parainfluenza Virus 3 NOT DETECTED NOT DETECTED Final   Parainfluenza Virus 4 NOT DETECTED NOT DETECTED Final   Respiratory Syncytial Virus NOT DETECTED NOT DETECTED Final   Bordetella pertussis NOT DETECTED NOT DETECTED Final   Bordetella Parapertussis NOT DETECTED NOT DETECTED Final   Chlamydophila pneumoniae NOT DETECTED NOT DETECTED Final   Mycoplasma pneumoniae NOT DETECTED NOT DETECTED Final    Comment: Performed at [facility], [address], [place], [place] [address]    Coagulation Studies: No results for input(s): LABPROT, INR in the  last 72 hours.  Urinalysis: No results for input(s): COLORURINE, LABSPEC, PHURINE, GLUCOSEU, HGBUR, BILIRUBINUR, KETONESUR, PROTEINUR, UROBILINOGEN, NITRITE, LEUKOCYTESUR in the last 72 hours.  Invalid input(s): APPERANCEUR    Imaging: DG Chest Portable 1 View Result Date: 04/24/2024 CLINICAL DATA:  Hypoxia and shortness of breath. EXAM: PORTABLE CHEST 1 VIEW COMPARISON:  Portable chest 03/09/2024 FINDINGS: 6:51 a.m. Left IJ dialysis catheter with distal split lumens. The most distal lumen tip is just above the superior cavoatrial junction, as before. Moderate cardiomegaly. There is perihilar vascular congestion with mild generalized interstitial edema and small pleural effusions. There is linear atelectasis in both bases, left mid field. No focal airspace infiltrate is seen. Mediastinum is normally outlined. There is aortic atherosclerosis. No new osseous findings. There were similar findings on the prior study. Today the basilar atelectasis is less prominent than previously but no other change is seen in overall aeration. IMPRESSION: 1. Cardiomegaly with perihilar vascular congestion, mild generalized interstitial edema and small pleural effusions. 2. Bibasilar atelectasis, less prominent than previously. 3. Left IJ dialysis catheter with distal split lumens. 4. Aortic atherosclerosis. Electronically Signed   By: Francis Quam M.D.   On: 04/24/2024 07:28     Medications:     Chlorhexidine  Gluconate Cloth  6 each Topical Q0600   hydrALAZINE   Assessment/ Plan:  Ms. RHEANNON CERNEY is a 84 y.o.  female with past medical conditions including diastolic heart failure, hypertension, hypothyroidism, COPD, and end stage renal disease on hemodialysis. Patient presents to the emergency department complaining of shortness of breath and has been admitted for CHF (congestive heart failure) (HCC) [I50.9]   Acute respiratory failure requiring BiPAP.  Patient room air at baseline.   Likely secondary to volume overload from increased sodium intake.  Awaiting urgent dialysis.  Will attempt to wean BiPAP during treatment.  2.  End-stage renal disease on hemodialysis.  Last treatment received on Wednesday.  Will provide urgent dialysis today, UF goal 2 L as tolerated.  3. Anemia of chronic kidney disease Lab Results  Component Value Date   HGB 9.2 (L) 04/24/2024    Hemoglobin acceptable for renal patient.  Will continue to monitor and assess need for ESA.  4.  Hypertension with chronic kidney disease.  Home regimen includes amlodipine , furosemide , hydralazine , metoprolol , and torsemide .  All currently held at this time.    LOS: 0 Tesla Keeler 7/24/20251:33 PM

## 2024-04-24 NOTE — ED Provider Notes (Signed)
 [facility] Provider Note    Event Date/Time   First MD Initiated Contact with Patient 04/24/24 [phone]     (approximate)   History   Shortness of Breath   HPI  [name] is a 83 y.o. female past medical history significant for COPD, HFpEF (55%), ESRD MWF, presents to the emergency department shortness of breath.  Patient states that she got her full session of dialysis yesterday.  Woke up at 2:00 in the morning with shortness of breath.  Tried to use her nebulizer machine with no significant improvement.  When EMS arrived initially she was doing well on room air and then in Routes became significantly hypoxic in the 80s, respiratory distress and they placed her on CPAP.  Blood pressure elevated in the 170s.  Patient does state that she is on Eliquis  but she is uncertain of why.  Denies any history of DVT or PE.  On chart review patient has a history of CHF with an EF of 55 to 60%, paroxysmal atrial fibrillation, ESRD and COPD.     Physical Exam   Triage Vital Signs: ED Triage Vitals  Encounter Vitals Group     BP      [name]      [name]      [name]      [name]      Pulse      Resp      Temp      Temp src      SpO2      Weight      Height      Head Circumference      Peak Flow      Pain Score      Pain Loc      Pain Education      Exclude from Growth Chart     Most recent vital signs: Vitals:   04/24/24 0730 04/24/24 0742  BP: (!) 153/50   Pulse: 62 63  Resp: 19 (!) 24  Temp:    SpO2: 99% 100%    Physical Exam Constitutional:      Appearance: She is well-developed.  HENT:     Head: Atraumatic.  Eyes:     Conjunctiva/sclera: Conjunctivae normal.  Cardiovascular:     Rate and Rhythm: Regular rhythm.  Pulmonary:     Effort: No respiratory distress.     Breath sounds: Wheezing and rales present.     Comments: Left PermCath Abdominal:      General: There is no distension.  Musculoskeletal:        General: Normal range of motion.     Cervical back: Normal range of motion.     Right lower leg: No edema.     Left lower leg: No edema.  Skin:    General: Skin is warm.  Neurological:     General: No focal deficit present.     Mental Status: She is alert. Mental status is at baseline.     IMPRESSION / MDM / ASSESSMENT AND PLAN / ED COURSE  I reviewed the triage vital signs and the nursing notes.  Differential diagnosis including flash pulmonary edema, CHF, pneumothorax, pneumonia, COPD exacerbation, ACS  EKG  I, Clotilda Punter, the attending physician, personally viewed and interpreted this ECG.  EKG showed normal sinus rhythm.  Narrow complex.  QTc 519.  No tachycardic or bradycardic dysrhythmias while on cardiac telemetry.  RADIOLOGY  I independently reviewed imaging, my interpretation of imaging: Chest x-ray with cardiomegaly and pulmonary edema no obvious pneumothorax no obvious pneumonia  LABS (all labs ordered are listed, but only abnormal results are displayed) Labs interpreted as -    Labs Reviewed  CBC WITH DIFFERENTIAL/PLATELET - Abnormal; Notable for the following components:      Result Value   WBC 11.8 (*)    RBC 3.06 (*)    Hemoglobin 9.2 (*)    HCT 28.8 (*)    Lymphs Abs 5.0 (*)    Eosinophils Absolute 1.5 (*)    All other components within normal limits  COMPREHENSIVE METABOLIC PANEL WITH GFR - Abnormal; Notable for the following components:   Chloride 95 (*)    Glucose, Bld 171 (*)    Creatinine, Ser 2.18 (*)    GFR, Estimated 22 (*)    All other components within normal limits  BLOOD GAS, VENOUS - Abnormal; Notable for the following components:   pO2, Ven 59 (*)    Bicarbonate 33.0 (*)    Acid-Base Excess 6.9 (*)    All other components within normal limits  BRAIN NATRIURETIC PEPTIDE - Abnormal; Notable for the following components:   B Natriuretic Peptide 1,057.8 (*)    All other  components within normal limits  TROPONIN I (HIGH SENSITIVITY) - Abnormal; Notable for the following components:   Troponin I (High Sensitivity) 34 (*)    All other components within normal limits  MAGNESIUM   PHOSPHORUS     MDM    On arrival patient with severe respiratory distress, placed on BiPAP.  Given DuoNeb treatments given significant wheezing on exam.  Allergy of prednisone  listed in the chart  Lab work with leukocytosis, anemia but hemoglobin is stable at 9.2 with a baseline of 8.  Platelets within normal limits.  Creatinine appears to be at her baseline.  Patient does still make urine.  Troponin 34 but appears chronically elevated likely in the setting of her ESRD.  No significant hypercarbia on VBG.  BNP significantly elevated in the 1000's, chest x-ray with findings of edema.  Clinically she is most concerning for CHF or COPD exacerbation.  Care transferred to incoming provider, link admitted for acute hypoxic respiratory failure   PROCEDURES:  Critical Care performed:yes  .Critical Care  Performed by: Suzanne Kirsch, MD Authorized by: Suzanne Kirsch, MD   Critical care provider statement:    Critical care time (minutes):  30   Critical care time was exclusive of:  Separately billable procedures and treating other patients   Critical care was time spent personally by me on the following activities:  Development of treatment plan with patient or surrogate, discussions with consultants, evaluation of patient's response to treatment, examination of patient, ordering and review of laboratory studies, ordering and review of radiographic studies, ordering and performing treatments and interventions, pulse oximetry, re-evaluation of patient's condition and review of old charts   Patient's presentation is most consistent with acute presentation with potential threat to life or bodily function.   MEDICATIONS ORDERED IN ED: Medications  ipratropium-albuterol  (DUONEB) 0.5-2.5 (3)  MG/3ML nebulizer solution 9 mL (9 mLs Nebulization Given 04/24/24 0634)    FINAL CLINICAL IMPRESSION(S) / ED DIAGNOSES   Final diagnoses:  SOB (shortness of breath)  Hypoxic     Rx / DC Orders   ED Discharge Orders     None        Note:  This document was prepared using Dragon voice recognition software and may include  unintentional dictation errors.   Suzanne Kirsch, MD 04/24/24 [phone]

## 2024-04-24 NOTE — ED Triage Notes (Signed)
 Pt arrives via ems for SOB. Pt reports sudden onset of sob/difficulty breathing that woke her from sleep around 0200. Ems reports pt went to dialysis yesterday. Pt arrives to ER on cpap and immediately switched to bipap by RT.  MWF dialysis pt

## 2024-04-24 NOTE — Progress Notes (Signed)
 Heart Failure Navigator Progress Note  Assessed for Heart & Vascular TOC clinic readiness.  Unfortunately, patient does not meet criteria due to ESRD on Hemodialysis.   Navigator will sign off at this time.  Celedonio Coil, RN, BSN [facility] Heart Failure Navigator Secure Chat Only

## 2024-04-25 ENCOUNTER — Telehealth: Payer: Self-pay

## 2024-04-25 DIAGNOSIS — Z8249 Family history of ischemic heart disease and other diseases of the circulatory system: Secondary | ICD-10-CM | POA: Diagnosis not present

## 2024-04-25 DIAGNOSIS — Z85828 Personal history of other malignant neoplasm of skin: Secondary | ICD-10-CM | POA: Diagnosis not present

## 2024-04-25 DIAGNOSIS — I132 Hypertensive heart and chronic kidney disease with heart failure and with stage 5 chronic kidney disease, or end stage renal disease: Secondary | ICD-10-CM | POA: Diagnosis present

## 2024-04-25 DIAGNOSIS — M1990 Unspecified osteoarthritis, unspecified site: Secondary | ICD-10-CM | POA: Diagnosis present

## 2024-04-25 DIAGNOSIS — D631 Anemia in chronic kidney disease: Secondary | ICD-10-CM | POA: Diagnosis present

## 2024-04-25 DIAGNOSIS — I480 Paroxysmal atrial fibrillation: Secondary | ICD-10-CM | POA: Diagnosis present

## 2024-04-25 DIAGNOSIS — J4489 Other specified chronic obstructive pulmonary disease: Secondary | ICD-10-CM | POA: Diagnosis present

## 2024-04-25 DIAGNOSIS — E1122 Type 2 diabetes mellitus with diabetic chronic kidney disease: Secondary | ICD-10-CM | POA: Diagnosis present

## 2024-04-25 DIAGNOSIS — K7581 Nonalcoholic steatohepatitis (NASH): Secondary | ICD-10-CM | POA: Diagnosis present

## 2024-04-25 DIAGNOSIS — Z79890 Hormone replacement therapy (postmenopausal): Secondary | ICD-10-CM | POA: Diagnosis not present

## 2024-04-25 DIAGNOSIS — M109 Gout, unspecified: Secondary | ICD-10-CM | POA: Diagnosis present

## 2024-04-25 DIAGNOSIS — N186 End stage renal disease: Secondary | ICD-10-CM | POA: Diagnosis present

## 2024-04-25 DIAGNOSIS — J9621 Acute and chronic respiratory failure with hypoxia: Secondary | ICD-10-CM | POA: Diagnosis present

## 2024-04-25 DIAGNOSIS — J9601 Acute respiratory failure with hypoxia: Secondary | ICD-10-CM | POA: Diagnosis not present

## 2024-04-25 DIAGNOSIS — Z66 Do not resuscitate: Secondary | ICD-10-CM | POA: Diagnosis present

## 2024-04-25 DIAGNOSIS — E890 Postprocedural hypothyroidism: Secondary | ICD-10-CM | POA: Diagnosis present

## 2024-04-25 DIAGNOSIS — Z79899 Other long term (current) drug therapy: Secondary | ICD-10-CM | POA: Diagnosis not present

## 2024-04-25 DIAGNOSIS — G4733 Obstructive sleep apnea (adult) (pediatric): Secondary | ICD-10-CM | POA: Diagnosis present

## 2024-04-25 DIAGNOSIS — Z9049 Acquired absence of other specified parts of digestive tract: Secondary | ICD-10-CM | POA: Diagnosis not present

## 2024-04-25 DIAGNOSIS — M810 Age-related osteoporosis without current pathological fracture: Secondary | ICD-10-CM | POA: Diagnosis present

## 2024-04-25 DIAGNOSIS — Z992 Dependence on renal dialysis: Secondary | ICD-10-CM

## 2024-04-25 DIAGNOSIS — R0602 Shortness of breath: Secondary | ICD-10-CM | POA: Diagnosis present

## 2024-04-25 DIAGNOSIS — I959 Hypotension, unspecified: Secondary | ICD-10-CM | POA: Diagnosis not present

## 2024-04-25 DIAGNOSIS — Z7901 Long term (current) use of anticoagulants: Secondary | ICD-10-CM | POA: Diagnosis not present

## 2024-04-25 DIAGNOSIS — E785 Hyperlipidemia, unspecified: Secondary | ICD-10-CM | POA: Diagnosis present

## 2024-04-25 DIAGNOSIS — I5033 Acute on chronic diastolic (congestive) heart failure: Secondary | ICD-10-CM | POA: Diagnosis present

## 2024-04-25 LAB — CBC
HCT: 26.4 % — ABNORMAL LOW (ref 36.0–46.0)
Hemoglobin: 8.5 g/dL — ABNORMAL LOW (ref 12.0–15.0)
MCH: 29.7 pg (ref 26.0–34.0)
MCHC: 32.2 g/dL (ref 30.0–36.0)
MCV: 92.3 fL (ref 80.0–100.0)
Platelets: 166 K/uL (ref 150–400)
RBC: 2.86 MIL/uL — ABNORMAL LOW (ref 3.87–5.11)
RDW: 15.2 % (ref 11.5–15.5)
WBC: 6.3 K/uL (ref 4.0–10.5)
nRBC: 0 % (ref 0.0–0.2)

## 2024-04-25 LAB — RENAL FUNCTION PANEL
Albumin: 3.5 g/dL (ref 3.5–5.0)
Anion gap: 14 (ref 5–15)
BUN: 26 mg/dL — ABNORMAL HIGH (ref 8–23)
CO2: 29 mmol/L (ref 22–32)
Calcium: 9.2 mg/dL (ref 8.9–10.3)
Chloride: 97 mmol/L — ABNORMAL LOW (ref 98–111)
Creatinine, Ser: 3.16 mg/dL — ABNORMAL HIGH (ref 0.44–1.00)
GFR, Estimated: 14 mL/min — ABNORMAL LOW (ref >60)
Glucose, Bld: 93 mg/dL (ref 70–99)
Phosphorus: 4.1 mg/dL (ref 2.5–4.6)
Potassium: 3.4 mmol/L — ABNORMAL LOW (ref 3.5–5.1)
Sodium: 140 mmol/L (ref 135–145)

## 2024-04-25 LAB — BASIC METABOLIC PANEL WITH GFR
Anion gap: 14 (ref 5–15)
BUN: 26 mg/dL — ABNORMAL HIGH (ref 8–23)
CO2: 29 mmol/L (ref 22–32)
Calcium: 9.3 mg/dL (ref 8.9–10.3)
Chloride: 98 mmol/L (ref 98–111)
Creatinine, Ser: 3.1 mg/dL — ABNORMAL HIGH (ref 0.44–1.00)
GFR, Estimated: 14 mL/min — ABNORMAL LOW (ref >60)
Glucose, Bld: 95 mg/dL (ref 70–99)
Potassium: 3.4 mmol/L — ABNORMAL LOW (ref 3.5–5.1)
Sodium: 141 mmol/L (ref 135–145)

## 2024-04-25 MED ORDER — DOCUSATE SODIUM 100 MG PO CAPS
100.0000 mg | ORAL_CAPSULE | Freq: Once | ORAL | Status: AC
  Administered 2024-04-25: 100 mg via ORAL
  Filled 2024-04-25: qty 1

## 2024-04-25 NOTE — Care Management Obs Status (Signed)
 MEDICARE OBSERVATION STATUS NOTIFICATION   Patient Details  Name: [name] MRN: [id number] Date of Birth: [birth year]   Medicare Observation Status Notification Given:  Yes    [place] Sigmon W, CMA 04/25/2024, 11:02 AM

## 2024-04-25 NOTE — Evaluation (Signed)
 Physical Therapy Evaluation Patient Details Name: [name] MRN: [id number] DOB: 11/14/1940 Today's Date: 04/25/2024  History of Present Illness  Pt is 83 y.o. female with medical history significant of ESRD on HD MWF HTN/chronic HFpEF, OSA on CPAP, COPD, hypothyroidism, presented with worsening of shortness of breath  Clinical Impression  Pt received in supine position, asleep, but easily awakened.  Pt wanting to continue resting, however voices that she knows she needs to move and walk to get her legs back to where they need to be.  Pt reports weakness from doing dialysis over the past several days, and reports fatigue has set in.  Pt ultimately moves well with bed mobility and transfers.  Pt able to ambulate well without any AD and no LOB.  Pt fatigued with ambulation around the ED nursing station, and SpO2 dropped to 88% on room air.  With pursed lip breathing, pt is able to return to >92% the remainder of time.  Pt reports she would like home health therapy to come and work with her in order to build her strength back up.        If plan is discharge home, recommend the following: Help with stairs or ramp for entrance   Can travel by private vehicle        Equipment Recommendations None recommended by PT  Recommendations for Other Services       Functional Status Assessment Patient has had a recent decline in their functional status and demonstrates the ability to make significant improvements in function in a reasonable and predictable amount of time.     Precautions / Restrictions Precautions Precautions: None      Mobility  Bed Mobility Overal bed mobility: Modified Independent             General bed mobility comments: extra time needed and use of the bed features, but no physical assistance provided.    Transfers Overall transfer level: Modified independent Equipment used: None               General transfer comment: Pt able to come upright into  standing and has good balance without AD.    Ambulation/Gait Ambulation/Gait assistance: Contact guard assist Gait Distance (Feet): 100 Feet Assistive device: None Gait Pattern/deviations: Step-through pattern, Narrow base of support Gait velocity: good speed     General Gait Details: Pt with slightly narrow BOS, however very steady and is able to handle turns without any complications.  Stairs            Wheelchair Mobility     Tilt Bed    Modified Rankin (Stroke Patients Only)       Balance Overall balance assessment: Mild deficits observed, not formally tested                                           Pertinent Vitals/Pain Pain Assessment Pain Assessment: No/denies pain    Home Living Family/patient expects to be discharged to:: Private residence Living Arrangements: Alone Available Help at Discharge: Family Type of Home: House Home Access: Stairs to enter Entrance Stairs-Rails: None Entrance Stairs-Number of Steps: 1 Alternate Level Stairs-Number of Steps: Flight Home Layout: Two level;Laundry or work area in basement;Able to live on main level with bedroom/bathroom Home Equipment: Agricultural consultant (2 wheels);BSC/3in1;Tub bench;Grab bars - tub/shower;Wheelchair - manual Additional Comments: does not access stairs in home regularly  Prior Function Prior Level of Function : Independent/Modified Independent             Mobility Comments: amb with no AD; reports she will use cane or walker if she is feeling off. ADLs Comments: MOD I feeding, grooming, bathing (with shower chair), dressing; pt cooks, niece helps w cleaning, med management; pt drives     Extremity/Trunk Assessment   Upper Extremity Assessment Upper Extremity Assessment: Overall WFL for tasks assessed    Lower Extremity Assessment Lower Extremity Assessment: Generalized weakness       Communication   Communication Communication: No apparent difficulties     Cognition Arousal: Alert Behavior During Therapy: WFL for tasks assessed/performed   PT - Cognitive impairments: No apparent impairments                                 Cueing       General Comments      Exercises     Assessment/Plan    PT Assessment Patient needs continued PT services  PT Problem List Decreased strength;Decreased activity tolerance;Decreased balance;Decreased safety awareness       PT Treatment Interventions DME instruction;Gait training;Stair training;Therapeutic activities;Therapeutic exercise;Balance training;Neuromuscular re-education    PT Goals (Current goals can be found in the Care Plan section)  Acute Rehab PT Goals Patient Stated Goal: pt wants to get legs stronger from dialysis. PT Goal Formulation: With patient Time For Goal Achievement: 05/09/24 Potential to Achieve Goals: Good    Frequency Min 2X/week     Co-evaluation               AM-PAC PT 6 Clicks Mobility  Outcome Measure Help needed turning from your back to your side while in a flat bed without using bedrails?: None Help needed moving from lying on your back to sitting on the side of a flat bed without using bedrails?: None Help needed moving to and from a bed to a chair (including a wheelchair)?: None Help needed standing up from a chair using your arms (e.g., wheelchair or bedside chair)?: None Help needed to walk in hospital room?: A Little Help needed climbing 3-5 steps with a railing? : A Little 6 Click Score: 22    End of Session Equipment Utilized During Treatment: Gait belt Activity Tolerance: Patient tolerated treatment well Patient left: in bed;with call bell/phone within reach Nurse Communication: Mobility status PT Visit Diagnosis: Unsteadiness on feet (R26.81);Muscle weakness (generalized) (M62.81)    Time: 8452-8440 PT Time Calculation (min) (ACUTE ONLY): 12 min   Charges:   PT Evaluation $PT Eval Low Complexity: 1 Low   PT  General Charges $$ ACUTE PT VISIT: 1 Visit         Fonda Simpers, PT, DPT Physical Therapist - [facility]  04/25/24, 4:23 PM

## 2024-04-25 NOTE — Progress Notes (Signed)
 Progress Note   Patient: [name] FMW:[id number] DOB: 1940/10/31 DOA: 04/24/2024  DOS: the patient was seen and examined on 04/25/2024   Brief hospital course:  83 y.o. 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  Found to be volume overload, HFpEF.  Assessment and Plan:  Acute hypoxic respiratory failure - Patient requiring BiPAP on presentation.  Secondary to volume overload.  Responding well to ultrafiltration/dialysis.  Wean down to 2 L nasal cannula.  Continue to wean as tolerated.  Acute exacerbation HFpEF secondary to volume overload - Progressive since beginning of the week.  Required BiPAP on presentation.  -2.1 L UF/HD yesterday.  Dialysis again today.  Followed closely by nephrology.  End-stage renal disease - Normal schedule MWF.  Followed closely by nephrology.  Hypertension - Likely exacerbated by above.  Restarted home medication regimen.  Hydralazine  as needed.  COPD - Does not appear to be in acute exacerbation.  Nebulizers as needed.  Physical debilitation muscle weakness - PT eval pending.  Hypothyroidism - TSH normal.  Continue levothyroxine .  Anemia of chronic disease - Likely secondary to ESRD.  No active bleeding appreciated.    Subjective: Patient resting comfortably this morning receiving HD.  States her breathing is markedly improved from presentation.  Denies any worsening shortness of breath, chest pain, nausea, vomiting, abdominal pain.  No lower extremity swelling.  Physical Exam:  Vitals:   04/25/24 1100 04/25/24 1130 04/25/24 1200 04/25/24 1212  BP: (!) 129/39 (!) 108/45 (!) 119/40 (!) 122/40  Pulse: (!) 56 62 60 (!) 58  Resp: 14 (!) 21 17 (!) 24  Temp:    98.2 F (36.8 C)  TempSrc:    Oral  SpO2: 94% 95% 94% 96%  Weight:    77.7 kg    GENERAL:  Alert, pleasant, no acute distress  HEENT:  EOMI, nasal  cannula CARDIOVASCULAR:  RRR, no murmurs appreciated RESPIRATORY:  Clear to auscultation, no wheezing, rales, or rhonchi GASTROINTESTINAL:  Soft, nontender, nondistended EXTREMITIES:  No LE edema bilaterally NEURO:  No new focal deficits appreciated SKIN:  No rashes noted PSYCH:  Appropriate mood and affect     Data Reviewed:  Imaging Studies: DG Chest Portable 1 View Result Date: 04/24/2024 CLINICAL DATA:  Hypoxia and shortness of breath. EXAM: PORTABLE CHEST 1 VIEW COMPARISON:  Portable chest 03/09/2024 FINDINGS: 6:51 a.m. Left IJ dialysis catheter with distal split lumens. The most distal lumen tip is just above the superior cavoatrial junction, as before. Moderate cardiomegaly. There is perihilar vascular congestion with mild generalized interstitial edema and small pleural effusions. There is linear atelectasis in both bases, left mid field. No focal airspace infiltrate is seen. Mediastinum is normally outlined. There is aortic atherosclerosis. No new osseous findings. There were similar findings on the prior study. Today the basilar atelectasis is less prominent than previously but no other change is seen in overall aeration. IMPRESSION: 1. Cardiomegaly with perihilar vascular congestion, mild generalized interstitial edema and small pleural effusions. 2. Bibasilar atelectasis, less prominent than previously. 3. Left IJ dialysis catheter with distal split lumens. 4. Aortic atherosclerosis. Electronically Signed   By: Francis Quam M.D.   On: 04/24/2024 07:28    There are no new results to review at this time.  Previous records (including but not limited to H&P, progress notes, nursing notes, TOC management) were reviewed in assessment of this patient.  Labs: CBC: Recent Labs  Lab  04/21/24 1516 04/24/24 0633 04/25/24 0850  WBC  --  11.8* 6.3  NEUTROABS  --  4.5  --   HGB 7.6* 9.2* 8.5*  HCT 23.4* 28.8* 26.4*  MCV  --  94.1 92.3  PLT  --  207 166   Basic Metabolic  Panel: Recent Labs  Lab 04/24/24 0633 04/25/24 0652 04/25/24 0850  NA 137 141 140  K 3.5 3.4* 3.4*  CL 95* 98 97*  CO2 27 29 29   GLUCOSE 171* 95 93  BUN 17 26* 26*  CREATININE 2.18* 3.10* 3.16*  CALCIUM  9.0 9.3 9.2  MG 1.8  --   --   PHOS 3.0  --  4.1   Liver Function Tests: Recent Labs  Lab 04/24/24 0633 04/25/24 0850  AST 34  --   ALT 19  --   ALKPHOS 93  --   BILITOT 0.9  --   PROT 6.9  --   ALBUMIN 3.8 3.5   CBG: No results for input(s): GLUCAP in the last 168 hours.  Scheduled Meds:  allopurinol   100 mg Oral Daily   amLODipine   5 mg Oral Daily   apixaban   2.5 mg Oral BID   azelastine   2 spray Each Nare BID   Chlorhexidine  Gluconate Cloth  6 each Topical Q0600   fluticasone   2 spray Each Nare Daily   fluticasone  furoate-vilanterol  1 puff Inhalation Daily   hydrALAZINE   100 mg Oral TID   levothyroxine   150 mcg Oral q morning   metoprolol  tartrate  25 mg Oral BID   montelukast   10 mg Oral QHS   pantoprazole   40 mg Oral Daily   rosuvastatin   40 mg Oral Daily   sertraline   100 mg Oral Daily   sodium chloride  flush  3 mL Intravenous Q12H   torsemide   40 mg Oral Daily   Continuous Infusions:  sodium chloride      PRN Meds:.sodium chloride , acetaminophen , albuterol , hydrALAZINE , ondansetron  (ZOFRAN ) IV, sodium chloride  flush  Family Communication: Met at bedside  Disposition: Status is: Observation The patient remains OBS appropriate and will d/c before 2 midnights.     Time spent: 40 minutes  Length of inpatient stay: 0 days  Author: Carliss LELON Canales, DO 04/25/2024 12:35 PM  For on call review [email or web address].

## 2024-04-25 NOTE — Plan of Care (Signed)
  Problem: Education: Goal: Knowledge of General Education information will improve Description: Including pain rating scale, medication(s)/side effects and non-pharmacologic comfort measures 04/25/2024 1757 by Arloa Dene KIDD, RN Outcome: Progressing 04/25/2024 1756 by Arloa Dene KIDD, RN Outcome: Progressing   Problem: Health Behavior/Discharge Planning: Goal: Ability to manage health-related needs will improve 04/25/2024 1757 by Arloa Dene KIDD, RN Outcome: Progressing 04/25/2024 1756 by Arloa Dene KIDD, RN Outcome: Progressing   Problem: Clinical Measurements: Goal: Ability to maintain clinical measurements within normal limits will improve 04/25/2024 1757 by Arloa Dene KIDD, RN Outcome: Progressing 04/25/2024 1756 by Arloa Dene KIDD, RN Outcome: Progressing Goal: Will remain free from infection 04/25/2024 1757 by Arloa Dene KIDD, RN Outcome: Progressing 04/25/2024 1756 by Arloa Dene KIDD, RN Outcome: Progressing Goal: Diagnostic test results will improve 04/25/2024 1757 by Arloa Dene KIDD, RN Outcome: Progressing 04/25/2024 1756 by Arloa Dene KIDD, RN Outcome: Progressing Goal: Respiratory complications will improve 04/25/2024 1757 by Arloa Dene KIDD, RN Outcome: Progressing 04/25/2024 1756 by Arloa Dene KIDD, RN Outcome: Progressing Goal: Cardiovascular complication will be avoided 04/25/2024 1757 by Arloa Dene KIDD, RN Outcome: Progressing 04/25/2024 1756 by Arloa Dene KIDD, RN Outcome: Progressing   Problem: Activity: Goal: Risk for activity intolerance will decrease 04/25/2024 1757 by Arloa Dene KIDD, RN Outcome: Progressing 04/25/2024 1756 by Arloa Dene KIDD, RN Outcome: Progressing   Problem: Nutrition: Goal: Adequate nutrition will be maintained 04/25/2024 1757 by Arloa Dene KIDD, RN Outcome: Progressing 04/25/2024 1756 by Arloa Dene KIDD, RN Outcome: Progressing   Problem: Coping: Goal: Level of anxiety will  decrease 04/25/2024 1757 by Arloa Dene KIDD, RN Outcome: Progressing 04/25/2024 1756 by Arloa Dene KIDD, RN Outcome: Progressing   Problem: Elimination: Goal: Will not experience complications related to bowel motility 04/25/2024 1757 by Arloa Dene KIDD, RN Outcome: Progressing 04/25/2024 1756 by Arloa Dene KIDD, RN Outcome: Progressing Goal: Will not experience complications related to urinary retention 04/25/2024 1757 by Arloa Dene KIDD, RN Outcome: Progressing 04/25/2024 1756 by Arloa Dene KIDD, RN Outcome: Progressing   Problem: Pain Managment: Goal: General experience of comfort will improve and/or be controlled 04/25/2024 1757 by Arloa Dene KIDD, RN Outcome: Progressing 04/25/2024 1756 by Arloa Dene KIDD, RN Outcome: Progressing   Problem: Safety: Goal: Ability to remain free from injury will improve 04/25/2024 1757 by Arloa Dene KIDD, RN Outcome: Progressing 04/25/2024 1756 by Arloa Dene KIDD, RN Outcome: Progressing   Problem: Skin Integrity: Goal: Risk for impaired skin integrity will decrease 04/25/2024 1757 by Arloa Dene KIDD, RN Outcome: Progressing 04/25/2024 1756 by Arloa Dene KIDD, RN Outcome: Progressing   Problem: Education: Goal: Ability to demonstrate management of disease process will improve Outcome: Progressing Goal: Ability to verbalize understanding of medication therapies will improve Outcome: Progressing Goal: Individualized Educational Video(s) Outcome: Progressing   Problem: Activity: Goal: Capacity to carry out activities will improve Outcome: Progressing   Problem: Cardiac: Goal: Ability to achieve and maintain adequate cardiopulmonary perfusion will improve Outcome: Progressing

## 2024-04-25 NOTE — Progress Notes (Signed)
 Hemodialysis Note:  Received patient in bed to unit. Alert and oriented. Informed consent singed and in chart.  Treatment initiated: 0835 Treatment completed: 1212  Access used: Left internal jugular catheter Access issues: None  200 ml saline bolus given due to leg cramps. Transported back to room, alert without acute distress. Report given to patient's RN.  Total UF removed: 1.3 Liters Medications given: None  Post HD weight: 77.7 Kg  [name]

## 2024-04-25 NOTE — Progress Notes (Signed)
 [facility]  ROUNDING NOTE   Subjective:   [name] is a 83 y.o. female with past medical conditions including diastolic heart failure, hypertension, hypothyroidism, COPD, and end stage renal disease on hemodialysis. Patient presents to the emergency department complaining of shortness of breath and has been admitted for CHF (congestive heart failure) (HCC) [I50.9] SOB (shortness of breath) [R06.02] Hypoxic [R09.02] Acute respiratory failure with hypoxia (HCC) [J96.01] ESRD on hemodialysis (HCC) [N18.6, Z99.2]  Patient is known to our practice and receives outpatient dialysis treatments at [facility], on a MWF schedule, supervised by Dr Marcelino.  Last treatment received on Wednesday, tolerated full treatment.  Patient denies exceeding her fluid restriction.  Does state she went out to eat last night and ate a mushroom burger.  Awoke this morning with severe shortness of breath.  Currently seen in emergency department on stretcher, BiPAP in place.  Labs on ED arrival unremarkable for renal patient.  BNP greater than 1000.  White count 11.8 with hemoglobin 9.2.  Chest x-ray shows cardiomegaly with perihilar vascular congestion with mild interstitial edema.  Update: Patient seen and evaluated during hemodialysis treatment today. Tolerating treatment well. UF target 1.5 kg today.    Objective:  Vital signs in last 24 hours:  Temp:  [97.4 F (36.3 C)-98.3 F (36.8 C)] 98.3 F (36.8 C) (07/25 0823) Pulse Rate:  [54-76] 58 (07/25 1030) Resp:  [10-30] 16 (07/25 1030) BP: (118-170)/(39-64) 119/39 (07/25 1030) SpO2:  [94 %-100 %] 95 % (07/25 1030) FiO2 (%):  [28 %] 28 % (07/24 2351) Weight:  [73.3 kg-79 kg] 79 kg (07/25 0823)  Weight change: -1.997 kg Filed Weights   04/24/24 0628 04/24/24 1718 04/25/24 0823  Weight: 75.3 kg 73.3 kg 79 kg    Intake/Output: I/O last 3 completed shifts: In: -  Out: 2100 [Other:2100]   Intake/Output this shift:  No  intake/output data recorded.  Physical Exam: General: No acute distress  Head: Normocephalic, atraumatic. Moist oral mucosal membranes  Eyes: Anicteric  Neck: Supple  Lungs:  Basilar rales  Heart: Regular rate and rhythm  Abdomen:  Soft, nontender  Extremities: No peripheral edema.  Neurologic: Awake, alert, conversant  Skin: Warm,dry, no rash  Access: Left IJ PermCath, left upper aVF    Basic Metabolic Panel: Recent Labs  Lab 04/24/24 0633 04/25/24 0652 04/25/24 0850  NA 137 141 140  K 3.5 3.4* 3.4*  CL 95* 98 97*  CO2 27 29 29   GLUCOSE 171* 95 93  BUN 17 26* 26*  CREATININE 2.18* 3.10* 3.16*  CALCIUM  9.0 9.3 9.2  MG 1.8  --   --   PHOS 3.0  --  4.1    Liver Function Tests: Recent Labs  Lab 04/24/24 0633 04/25/24 0850  AST 34  --   ALT 19  --   ALKPHOS 93  --   BILITOT 0.9  --   PROT 6.9  --   ALBUMIN 3.8 3.5   No results for input(s): LIPASE, AMYLASE in the last 168 hours. No results for input(s): AMMONIA in the last 168 hours.  CBC: Recent Labs  Lab 04/21/24 1516 04/24/24 0633 04/25/24 0850  WBC  --  11.8* 6.3  NEUTROABS  --  4.5  --   HGB 7.6* 9.2* 8.5*  HCT 23.4* 28.8* 26.4*  MCV  --  94.1 92.3  PLT  --  207 166    Cardiac Enzymes: No results for input(s): CKTOTAL, CKMB, CKMBINDEX, TROPONINI in the last 168 hours.  BNP: Invalid input(s): POCBNP  CBG: No results for input(s): GLUCAP in the last 168 hours.  Microbiology: Results for orders placed or performed during the hospital encounter of 03/09/24  Resp panel by RT-PCR (RSV, Flu A&B, Covid) Anterior Nasal Swab     Status: None   Collection Time: 03/09/24  7:18 PM   Specimen: Anterior Nasal Swab  Result Value Ref Range Status   SARS Coronavirus 2 by RT PCR NEGATIVE NEGATIVE Final    Comment: (NOTE) SARS-CoV-2 target nucleic acids are NOT DETECTED.  The SARS-CoV-2 RNA is generally detectable in upper respiratory specimens during the acute phase of infection. The  lowest concentration of SARS-CoV-2 viral copies this assay can detect is 138 copies/mL. A negative result does not preclude SARS-Cov-2 infection and should not be used as the sole basis for treatment or other patient management decisions. A negative result may occur with  improper specimen collection/handling, submission of specimen other than nasopharyngeal swab, presence of viral mutation(s) within the areas targeted by this assay, and inadequate number of viral copies(<138 copies/mL). A negative result must be combined with clinical observations, patient history, and epidemiological information. The expected result is Negative.  Fact Sheet for Patients:  BloggerCourse.com  Fact Sheet for Healthcare Providers:  SeriousBroker.it  This test is no t yet approved or cleared by the [place]  FDA and  has been authorized for detection and/or diagnosis of SARS-CoV-2 by FDA under an Emergency Use Authorization (EUA). This EUA will remain  in effect (meaning this test can be used) for the duration of the COVID-19 declaration under Section 564(b)(1) of the Act, 21 U.S.C.section 360bbb-3(b)(1), unless the authorization is terminated  or revoked sooner.       Influenza A by PCR NEGATIVE NEGATIVE Final   Influenza B by PCR NEGATIVE NEGATIVE Final    Comment: (NOTE) The Xpert Xpress SARS-CoV-2/FLU/RSV plus assay is intended as an aid in the diagnosis of influenza from Nasopharyngeal swab specimens and should not be used as a sole basis for treatment. Nasal washings and aspirates are unacceptable for Xpert Xpress SARS-CoV-2/FLU/RSV testing.  Fact Sheet for Patients: BloggerCourse.com  Fact Sheet for Healthcare Providers: SeriousBroker.it  This test is not yet approved or cleared by the [place]  FDA and has been authorized for detection and/or diagnosis of SARS-CoV-2 by FDA under  an Emergency Use Authorization (EUA). This EUA will remain in effect (meaning this test can be used) for the duration of the COVID-19 declaration under Section 564(b)(1) of the Act, 21 U.S.C. section 360bbb-3(b)(1), unless the authorization is terminated or revoked.     Resp Syncytial Virus by PCR NEGATIVE NEGATIVE Final    Comment: (NOTE) Fact Sheet for Patients: BloggerCourse.com  Fact Sheet for Healthcare Providers: SeriousBroker.it  This test is not yet approved or cleared by the [place]  FDA and has been authorized for detection and/or diagnosis of SARS-CoV-2 by FDA under an Emergency Use Authorization (EUA). This EUA will remain in effect (meaning this test can be used) for the duration of the COVID-19 declaration under Section 564(b)(1) of the Act, 21 U.S.C. section 360bbb-3(b)(1), unless the authorization is terminated or revoked.  Performed at [facility], [address] [place], [place] [address]   Respiratory (~20 pathogens) panel by PCR     Status: None   Collection Time: 03/09/24 10:00 PM   Specimen: Nasopharyngeal Swab; Respiratory  Result Value Ref Range Status   Adenovirus NOT DETECTED NOT DETECTED Final   Coronavirus 229E NOT DETECTED NOT DETECTED Final  Comment: (NOTE) The Coronavirus on the Respiratory Panel, DOES NOT test for the novel  Coronavirus (2019 nCoV)    Coronavirus HKU1 NOT DETECTED NOT DETECTED Final   Coronavirus NL63 NOT DETECTED NOT DETECTED Final   Coronavirus OC43 NOT DETECTED NOT DETECTED Final   Metapneumovirus NOT DETECTED NOT DETECTED Final   Rhinovirus / Enterovirus NOT DETECTED NOT DETECTED Final   Influenza A NOT DETECTED NOT DETECTED Final   Influenza B NOT DETECTED NOT DETECTED Final   Parainfluenza Virus 1 NOT DETECTED NOT DETECTED Final   Parainfluenza Virus 2 NOT DETECTED NOT DETECTED Final   Parainfluenza Virus 3 NOT DETECTED NOT DETECTED Final    Parainfluenza Virus 4 NOT DETECTED NOT DETECTED Final   Respiratory Syncytial Virus NOT DETECTED NOT DETECTED Final   Bordetella pertussis NOT DETECTED NOT DETECTED Final   Bordetella Parapertussis NOT DETECTED NOT DETECTED Final   Chlamydophila pneumoniae NOT DETECTED NOT DETECTED Final   Mycoplasma pneumoniae NOT DETECTED NOT DETECTED Final    Comment: Performed at [facility], [address] [place], [place] [address]    Coagulation Studies: No results for input(s): LABPROT, INR in the last 72 hours.  Urinalysis: No results for input(s): COLORURINE, LABSPEC, PHURINE, GLUCOSEU, HGBUR, BILIRUBINUR, KETONESUR, PROTEINUR, UROBILINOGEN, NITRITE, LEUKOCYTESUR in the last 72 hours.  Invalid input(s): APPERANCEUR    Imaging: DG Chest Portable 1 View Result Date: 04/24/2024 CLINICAL DATA:  Hypoxia and shortness of breath. EXAM: PORTABLE CHEST 1 VIEW COMPARISON:  Portable chest 03/09/2024 FINDINGS: 6:51 a.m. Left IJ dialysis catheter with distal split lumens. The most distal lumen tip is just above the superior cavoatrial junction, as before. Moderate cardiomegaly. There is perihilar vascular congestion with mild generalized interstitial edema and small pleural effusions. There is linear atelectasis in both bases, left mid field. No focal airspace infiltrate is seen. Mediastinum is normally outlined. There is aortic atherosclerosis. No new osseous findings. There were similar findings on the prior study. Today the basilar atelectasis is less prominent than previously but no other change is seen in overall aeration. IMPRESSION: 1. Cardiomegaly with perihilar vascular congestion, mild generalized interstitial edema and small pleural effusions. 2. Bibasilar atelectasis, less prominent than previously. 3. Left IJ dialysis catheter with distal split lumens. 4. Aortic atherosclerosis. Electronically Signed   By: Francis Quam M.D.   On: 04/24/2024 07:28      Medications:    sodium chloride       allopurinol   100 mg Oral Daily   amLODipine   5 mg Oral Daily   apixaban   2.5 mg Oral BID   azelastine   2 spray Each Nare BID   Chlorhexidine  Gluconate Cloth  6 each Topical Q0600   fluticasone   2 spray Each Nare Daily   fluticasone  furoate-vilanterol  1 puff Inhalation Daily   hydrALAZINE   100 mg Oral TID   levothyroxine   150 mcg Oral q morning   metoprolol  tartrate  25 mg Oral BID   montelukast   10 mg Oral QHS   pantoprazole   40 mg Oral Daily   rosuvastatin   40 mg Oral Daily   sertraline   100 mg Oral Daily   sodium chloride  flush  3 mL Intravenous Q12H   torsemide   40 mg Oral Daily   sodium chloride , acetaminophen , albuterol , hydrALAZINE , ondansetron  (ZOFRAN ) IV, sodium chloride  flush  Assessment/ Plan:  [name] is a 83 y.o.  female with past medical conditions including diastolic heart failure, hypertension, hypothyroidism, COPD, and end stage renal disease on hemodialysis. Patient presents to  the emergency department complaining of shortness of breath and has been admitted for CHF (congestive heart failure) (HCC) [I50.9] SOB (shortness of breath) [R06.02] Hypoxic [R09.02] Acute respiratory failure with hypoxia (HCC) [J96.01] ESRD on hemodialysis (HCC) [N18.6, Z99.2]   Acute respiratory failure requiring BiPAP at admission.  Patient weaned off BiPAP.  Breathing much more comfortably today.  Continue ultrafiltration with dialysis treatment today.  2.  End-stage renal disease on hemodialysis.  Patient seen and evaluated during hemodialysis treatment today.  UF target 1.5 kg.  3. Anemia of chronic kidney disease Lab Results  Component Value Date   HGB 8.5 (L) 04/25/2024    Resume Mircera as an outpatient.  4.  Hypertension with chronic kidney disease.  Home regimen includes amlodipine , furosemide , hydralazine , metoprolol , and torsemide .  Continue amlodipine , hydralazine , metoprolol , and torsemide  currently.    LOS:  0 [name] 7/25/202511:09 AM

## 2024-04-25 NOTE — Telephone Encounter (Signed)
*  Pulm  Pharmacy Patient Advocate Encounter   Received notification from CoverMyMeds that prior authorization for Ipratropium-Albuterol  0.5-2.5 (3)MG/3ML solution  is required/requested.   Insurance verification completed.   The patient is insured through [employer] .   Per test claim: Medication is not eligible for pharmacy benefits and must be billed through medical insurance. As our team only handles pharmacy related prior auths, medical PA's must be submitted by the clinic. Thank you

## 2024-04-25 NOTE — TOC CM/SW Note (Signed)
..  Transition of Care [facility]) - Inpatient Brief Assessment   Patient Details  Name: [name] MRN: [id number] Date of Birth: [birth year]  Transition of Care [facility]) CM/SW Contact:    Edsel DELENA Fischer, LCSW Phone Number: 04/25/2024, 4:11 PM   Clinical Narrative:  SW went to pt room to complete TOC asst.  Pt was asleep.  SW will return at later time to complete asst.   Transition of Care Asessment:

## 2024-04-25 NOTE — Hospital Course (Signed)
 83 y.o. 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  Found to be volume overload, HFpEF.   Assessment and Plan:   Acute hypoxic respiratory failure - Patient requiring BiPAP on presentation.  Secondary to volume overload.  Responding well to ultrafiltration/dialysis.  Wean down to 2 L nasal cannula.  Continue to wean as tolerated.   Acute exacerbation HFpEF secondary to volume overload - Progressive since beginning of the week.  Required BiPAP on presentation.  -2.1 L UF/HD yesterday.  Dialysis again today.  Followed closely by nephrology.   End-stage renal disease - Normal schedule MWF.  Followed closely by nephrology.   Hypertension - Likely exacerbated by above.  Restarted home medication regimen.  Hydralazine  as needed.   COPD - Does not appear to be in acute exacerbation.  Nebulizers as needed.   Physical debilitation muscle weakness - PT eval pending.   Hypothyroidism - TSH normal.  Continue levothyroxine .   Anemia of chronic disease - Likely secondary to ESRD.  No active bleeding appreciated.

## 2024-04-26 DIAGNOSIS — I5033 Acute on chronic diastolic (congestive) heart failure: Secondary | ICD-10-CM | POA: Diagnosis not present

## 2024-04-26 LAB — CBC
HCT: 27 % — ABNORMAL LOW (ref 36.0–46.0)
Hemoglobin: 8.6 g/dL — ABNORMAL LOW (ref 12.0–15.0)
MCH: 29.3 pg (ref 26.0–34.0)
MCHC: 31.9 g/dL (ref 30.0–36.0)
MCV: 91.8 fL (ref 80.0–100.0)
Platelets: 166 K/uL (ref 150–400)
RBC: 2.94 MIL/uL — ABNORMAL LOW (ref 3.87–5.11)
RDW: 15.2 % (ref 11.5–15.5)
WBC: 7.6 K/uL (ref 4.0–10.5)
nRBC: 0 % (ref 0.0–0.2)

## 2024-04-26 LAB — BASIC METABOLIC PANEL WITH GFR
Anion gap: 9 (ref 5–15)
BUN: 29 mg/dL — ABNORMAL HIGH (ref 8–23)
CO2: 28 mmol/L (ref 22–32)
Calcium: 9.2 mg/dL (ref 8.9–10.3)
Chloride: 95 mmol/L — ABNORMAL LOW (ref 98–111)
Creatinine, Ser: 2.97 mg/dL — ABNORMAL HIGH (ref 0.44–1.00)
GFR, Estimated: 15 mL/min — ABNORMAL LOW (ref >60)
Glucose, Bld: 99 mg/dL (ref 70–99)
Potassium: 3.4 mmol/L — ABNORMAL LOW (ref 3.5–5.1)
Sodium: 134 mmol/L — ABNORMAL LOW (ref 135–145)

## 2024-04-26 LAB — MAGNESIUM: Magnesium: 1.9 mg/dL (ref 1.7–2.4)

## 2024-04-26 MED ORDER — EPOETIN ALFA-EPBX 10000 UNIT/ML IJ SOLN
4000.0000 [IU] | INTRAMUSCULAR | Status: DC
  Administered 2024-04-28 – 2024-04-30 (×2): 4000 [IU] via INTRAVENOUS
  Filled 2024-04-26: qty 2

## 2024-04-26 MED ORDER — MIDODRINE HCL 5 MG PO TABS
5.0000 mg | ORAL_TABLET | Freq: Two times a day (BID) | ORAL | Status: DC
  Administered 2024-04-26: 5 mg via ORAL
  Filled 2024-04-26 (×2): qty 1

## 2024-04-26 NOTE — Care Management Important Message (Signed)
 Important Message  Patient Details  Name: [name] MRN: [id number] Date of Birth: Oct 26, 1940   Important Message Given:  Yes - Medicare IM     [name] 04/26/2024, 1:38 PM

## 2024-04-26 NOTE — Plan of Care (Signed)

## 2024-04-26 NOTE — Progress Notes (Addendum)
 Progress Note   Patient: [name] FMW:[id number] DOB: Nov 27, 1940 DOA: 04/24/2024     1 DOS: the patient was seen and examined on 04/26/2024   Brief hospital course:  83 y.o. 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  Found to be volume overload, HFpEF.  Pt was admitted for further evaluation and management including urgent dialysis for ultrafiltration.  Further hospital course and management as outlined below.   Assessment and Plan:  Acute respiratory failure with hypoxia Initially requiring BiPAP on presentation.   Secondary to volume overload, improving with ultrafiltration/dialysis.   Weaned to 2 L nasal cannula.   --Continue to wean as tolerated   Acute exacerbation HFpEF secondary to volume overload Progressive since beginning of the week prior to admission.   Initially required BiPAP for respiratory distress on presentation.   Improving with serial dialysis --Continue volume management by dialysis --Also on torsemide  40 mg daily -- held this AM (7/26) for low BP    End-stage renal disease Normal schedule MWF --Nephrology following   Hypotension -- 7/26 -- soft BP's with diastolic in 30's-40's and MAP's below 65 Hx of Hypertension --Hold BP meds for today --Add midodrine  5 mg BID WC --Maintain MAP > 65 --Added hold parameters to BP meds shown below - hold for MAP< 65 --Resumed on home regimen: torsemide  40 mg daily, metop 25 mg BID, amlodipine  5 mg daily, hydralazine  100 mg TID --Home regimen may require de-escalating if low BP's are persistent --Hydralazine  PRN   COPD - stable, not exacerbated --PRN nebulized bronchodilators   Physical debilitation muscle weakness --PT/OT evaluations   Hypothyroidism - TSH normal.   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  No active bleeding appreciated. -- Monitor CBC       Subjective: Pt awake resting in bed on rounds this AM. Pt having low BP's this AM and reports feeling more weak.  Nursing confirm also observing increased weakness.  Pt otherwise reports feeling improved, no shortness of breath at rest.  No other acute complaints.    Physical Exam: Vitals:   04/26/24 0751 04/26/24 0935 04/26/24 1016 04/26/24 1128  BP: (!) 131/32 (!) 110/31 (!) 122/36 (!) 110/34  Pulse: (!) 57   (!) 57  Resp: 12   18  Temp: 98.4 F (36.9 C)   98.4 F (36.9 C)  TempSrc:      SpO2: 99%   97%  Weight:      Height:       General exam: awake, alert, no acute distress HEENT: moist mucus membranes, hearing grossly normal  Respiratory system: CTAB diminished bases, no wheezes, rales or rhonchi, normal respiratory effort at rest on 2 L/min [place] O2 Cardiovascular system: normal S1/S2, RRR Gastrointestinal system: soft, NT, ND, no HSM felt, +bowel sounds. Central nervous system: A&O x 3. no gross focal neurologic deficits, normal speech Extremities: moves all, no BLE edema, normal tone Skin: dry, intact, normal temperature Psychiatry: normal mood, congruent affect, judgement and insight appear normal   Data Reviewed:  Notable labs -- Na 134, K 3.4, Cl 95, BUN 29, Cr 3.16 >> 2.97, Hbg 8.6  Family Communication: None at bedside, will attempt to call as time allows  Disposition: Status is: Inpatient Remains inpatient appropriate because: needs further clinical improvement and weaning oxygen.   Planned Discharge Destination: Home with Home Health    Time spent: 40 minutes  Author: Burnard DELENA Cunning, DO 04/26/2024 12:36 PM  For on call review [email or web address].

## 2024-04-26 NOTE — Progress Notes (Signed)
 [facility]  ROUNDING NOTE   Subjective:   [name] is a 83 y.o. female with past medical conditions including diastolic heart failure, hypertension, hypothyroidism, COPD, and end stage renal disease on hemodialysis. Patient presents to the emergency department complaining of shortness of breath and has been admitted for CHF (congestive heart failure) (HCC) [I50.9] SOB (shortness of breath) [R06.02] Hypoxic [R09.02] Acute respiratory failure with hypoxia (HCC) [J96.01] ESRD on hemodialysis (HCC) [N18.6, Z99.2]  Patient is known to our practice and receives outpatient dialysis treatments at [facility], on a MWF schedule, supervised by Dr Marcelino.   Update: Patient seen resting in bed Reports she feels weak, feels this is from a low blood pressure Daughter arrives during visit, request change in outpatient dialysis clinic  Objective:  Vital signs in last 24 hours:  Temp:  [98.1 F (36.7 C)-98.7 F (37.1 C)] 98.4 F (36.9 C) (07/26 1128) Pulse Rate:  [56-67] 57 (07/26 1128) Resp:  [12-20] 18 (07/26 1128) BP: (104-133)/(31-66) 110/34 (07/26 1128) SpO2:  [93 %-100 %] 97 % (07/26 1128) FiO2 (%):  [28 %-32 %] 32 % (07/25 2234) Weight:  [71.6 kg-71.8 kg] 71.8 kg (07/26 0501)  Weight change: 5.7 kg Filed Weights   04/25/24 1212 04/25/24 1652 04/26/24 0501  Weight: 77.7 kg 71.6 kg 71.8 kg    Intake/Output: I/O last 3 completed shifts: In: 300 [P.O.:300] Out: 1300 [Other:1300]   Intake/Output this shift:  Total I/O In: 360 [P.O.:360] Out: -   Physical Exam: General: No acute distress  Head: Normocephalic, atraumatic. Moist oral mucosal membranes  Eyes: Anicteric  Neck: Supple  Lungs:  Clear to auscultation, NCO 2  Heart: Regular rate and rhythm  Abdomen:  Soft, nontender  Extremities: No peripheral edema.  Neurologic: Awake, alert, conversant  Skin: Warm,dry, no rash  Access: Left IJ PermCath, left upper aVF    Basic Metabolic Panel: Recent  Labs  Lab 04/24/24 0633 04/25/24 0652 04/25/24 0850 04/26/24 0223  NA 137 141 140 134*  K 3.5 3.4* 3.4* 3.4*  CL 95* 98 97* 95*  CO2 27 29 29 28   GLUCOSE 171* 95 93 99  BUN 17 26* 26* 29*  CREATININE 2.18* 3.10* 3.16* 2.97*  CALCIUM  9.0 9.3 9.2 9.2  MG 1.8  --   --  1.9  PHOS 3.0  --  4.1  --     Liver Function Tests: Recent Labs  Lab 04/24/24 0633 04/25/24 0850  AST 34  --   ALT 19  --   ALKPHOS 93  --   BILITOT 0.9  --   PROT 6.9  --   ALBUMIN 3.8 3.5   No results for input(s): LIPASE, AMYLASE in the last 168 hours. No results for input(s): AMMONIA in the last 168 hours.  CBC: Recent Labs  Lab 04/21/24 1516 04/24/24 0633 04/25/24 0850 04/26/24 0223  WBC  --  11.8* 6.3 7.6  NEUTROABS  --  4.5  --   --   HGB 7.6* 9.2* 8.5* 8.6*  HCT 23.4* 28.8* 26.4* 27.0*  MCV  --  94.1 92.3 91.8  PLT  --  207 166 166    Cardiac Enzymes: No results for input(s): CKTOTAL, CKMB, CKMBINDEX, TROPONINI in the last 168 hours.  BNP: Invalid input(s): POCBNP  CBG: No results for input(s): GLUCAP in the last 168 hours.  Microbiology: Results for orders placed or performed during the hospital encounter of 03/09/24  Resp panel by RT-PCR (RSV, Flu A&B, Covid) Anterior Nasal Swab  Status: None   Collection Time: 03/09/24  7:18 PM   Specimen: Anterior Nasal Swab  Result Value Ref Range Status   SARS Coronavirus 2 by RT PCR NEGATIVE NEGATIVE Final    Comment: (NOTE) SARS-CoV-2 target nucleic acids are NOT DETECTED.  The SARS-CoV-2 RNA is generally detectable in upper respiratory specimens during the acute phase of infection. The lowest concentration of SARS-CoV-2 viral copies this assay can detect is 138 copies/mL. A negative result does not preclude SARS-Cov-2 infection and should not be used as the sole basis for treatment or other patient management decisions. A negative result may occur with  improper specimen collection/handling, submission of  specimen other than nasopharyngeal swab, presence of viral mutation(s) within the areas targeted by this assay, and inadequate number of viral copies(<138 copies/mL). A negative result must be combined with clinical observations, patient history, and epidemiological information. The expected result is Negative.  Fact Sheet for Patients:  BloggerCourse.com  Fact Sheet for Healthcare Providers:  SeriousBroker.it  This test is no t yet approved or cleared by the [place]  FDA and  has been authorized for detection and/or diagnosis of SARS-CoV-2 by FDA under an Emergency Use Authorization (EUA). This EUA will remain  in effect (meaning this test can be used) for the duration of the COVID-19 declaration under Section 564(b)(1) of the Act, 21 U.S.C.section 360bbb-3(b)(1), unless the authorization is terminated  or revoked sooner.       Influenza A by PCR NEGATIVE NEGATIVE Final   Influenza B by PCR NEGATIVE NEGATIVE Final    Comment: (NOTE) The Xpert Xpress SARS-CoV-2/FLU/RSV plus assay is intended as an aid in the diagnosis of influenza from Nasopharyngeal swab specimens and should not be used as a sole basis for treatment. Nasal washings and aspirates are unacceptable for Xpert Xpress SARS-CoV-2/FLU/RSV testing.  Fact Sheet for Patients: BloggerCourse.com  Fact Sheet for Healthcare Providers: SeriousBroker.it  This test is not yet approved or cleared by the [place]  FDA and has been authorized for detection and/or diagnosis of SARS-CoV-2 by FDA under an Emergency Use Authorization (EUA). This EUA will remain in effect (meaning this test can be used) for the duration of the COVID-19 declaration under Section 564(b)(1) of the Act, 21 U.S.C. section 360bbb-3(b)(1), unless the authorization is terminated or revoked.     Resp Syncytial Virus by PCR NEGATIVE NEGATIVE Final     Comment: (NOTE) Fact Sheet for Patients: BloggerCourse.com  Fact Sheet for Healthcare Providers: SeriousBroker.it  This test is not yet approved or cleared by the [place]  FDA and has been authorized for detection and/or diagnosis of SARS-CoV-2 by FDA under an Emergency Use Authorization (EUA). This EUA will remain in effect (meaning this test can be used) for the duration of the COVID-19 declaration under Section 564(b)(1) of the Act, 21 U.S.C. section 360bbb-3(b)(1), unless the authorization is terminated or revoked.  Performed at [facility], [address] [place], [place] [address]   Respiratory (~20 pathogens) panel by PCR     Status: None   Collection Time: 03/09/24 10:00 PM   Specimen: Nasopharyngeal Swab; Respiratory  Result Value Ref Range Status   Adenovirus NOT DETECTED NOT DETECTED Final   Coronavirus 229E NOT DETECTED NOT DETECTED Final    Comment: (NOTE) The Coronavirus on the Respiratory Panel, DOES NOT test for the novel  Coronavirus (2019 nCoV)    Coronavirus HKU1 NOT DETECTED NOT DETECTED Final   Coronavirus NL63 NOT DETECTED NOT DETECTED Final   Coronavirus OC43 NOT DETECTED NOT  DETECTED Final   Metapneumovirus NOT DETECTED NOT DETECTED Final   Rhinovirus / Enterovirus NOT DETECTED NOT DETECTED Final   Influenza A NOT DETECTED NOT DETECTED Final   Influenza B NOT DETECTED NOT DETECTED Final   Parainfluenza Virus 1 NOT DETECTED NOT DETECTED Final   Parainfluenza Virus 2 NOT DETECTED NOT DETECTED Final   Parainfluenza Virus 3 NOT DETECTED NOT DETECTED Final   Parainfluenza Virus 4 NOT DETECTED NOT DETECTED Final   Respiratory Syncytial Virus NOT DETECTED NOT DETECTED Final   Bordetella pertussis NOT DETECTED NOT DETECTED Final   Bordetella Parapertussis NOT DETECTED NOT DETECTED Final   Chlamydophila pneumoniae NOT DETECTED NOT DETECTED Final   Mycoplasma pneumoniae NOT DETECTED NOT  DETECTED Final    Comment: Performed at [facility], [address] [place], [place] [address]    Coagulation Studies: No results for input(s): LABPROT, INR in the last 72 hours.  Urinalysis: No results for input(s): COLORURINE, LABSPEC, PHURINE, GLUCOSEU, HGBUR, BILIRUBINUR, KETONESUR, PROTEINUR, UROBILINOGEN, NITRITE, LEUKOCYTESUR in the last 72 hours.  Invalid input(s): APPERANCEUR    Imaging: No results found.    Medications:      allopurinol   100 mg Oral Daily   amLODipine   5 mg Oral Daily   apixaban   2.5 mg Oral BID   azelastine   2 spray Each Nare BID   Chlorhexidine  Gluconate Cloth  6 each Topical Q0600   fluticasone   2 spray Each Nare Daily   fluticasone  furoate-vilanterol  1 puff Inhalation Daily   hydrALAZINE   100 mg Oral TID   levothyroxine   150 mcg Oral q morning   metoprolol  tartrate  25 mg Oral BID   midodrine   5 mg Oral BID WC   montelukast   10 mg Oral QHS   pantoprazole   40 mg Oral Daily   rosuvastatin   40 mg Oral Daily   sertraline   100 mg Oral Daily   sodium chloride  flush  3 mL Intravenous Q12H   torsemide   40 mg Oral Daily   acetaminophen , albuterol , hydrALAZINE , ondansetron  (ZOFRAN ) IV, sodium chloride  flush  Assessment/ Plan:  [name] is a 83 y.o.  female with past medical conditions including diastolic heart failure, hypertension, hypothyroidism, COPD, and end stage renal disease on hemodialysis. Patient presents to the emergency department complaining of shortness of breath and has been admitted for CHF (congestive heart failure) (HCC) [I50.9] SOB (shortness of breath) [R06.02] Hypoxic [R09.02] Acute respiratory failure with hypoxia (HCC) [J96.01] ESRD on hemodialysis (HCC) [N18.6, Z99.2]   Acute respiratory failure requiring BiPAP at admission.  Patient weaned off BiPAP.  Weaned to nasal cannula, 2 L.    2.  End-stage renal disease on hemodialysis.  Received dialysis yesterday, UF 1.4 L  achieved.  Next treatment scheduled for Monday, if patient remains inpatient.  Will request clinic transfer with outpatient clinic.  3. Anemia of chronic kidney disease Lab Results  Component Value Date   HGB 8.6 (L) 04/26/2024  Hemoglobin slightly decreased, 8.6.  Will order low-dose EPO with dialysis while inpatient. Resume Mircera as an outpatient.  4.  Hypertension with chronic kidney disease.  Home regimen includes amlodipine , furosemide , hydralazine , metoprolol , and torsemide .  Continue amlodipine , hydralazine , metoprolol , and torsemide  currently.  Blood pressure soft today, 110/34.  Primary team to hold a.m. medications.    LOS: 1 [name] 7/26/202512:42 PM

## 2024-04-27 DIAGNOSIS — I5033 Acute on chronic diastolic (congestive) heart failure: Secondary | ICD-10-CM | POA: Diagnosis not present

## 2024-04-27 LAB — BASIC METABOLIC PANEL WITH GFR
Anion gap: 12 (ref 5–15)
BUN: 58 mg/dL — ABNORMAL HIGH (ref 8–23)
CO2: 26 mmol/L (ref 22–32)
Calcium: 9.1 mg/dL (ref 8.9–10.3)
Chloride: 96 mmol/L — ABNORMAL LOW (ref 98–111)
Creatinine, Ser: 4.31 mg/dL — ABNORMAL HIGH (ref 0.44–1.00)
GFR, Estimated: 10 mL/min — ABNORMAL LOW (ref >60)
Glucose, Bld: 94 mg/dL (ref 70–99)
Potassium: 3.9 mmol/L (ref 3.5–5.1)
Sodium: 134 mmol/L — ABNORMAL LOW (ref 135–145)

## 2024-04-27 MED ORDER — ALUM & MAG HYDROXIDE-SIMETH 200-200-20 MG/5ML PO SUSP
30.0000 mL | ORAL | Status: DC | PRN
  Administered 2024-04-28: 30 mL via ORAL
  Filled 2024-04-27 (×2): qty 30

## 2024-04-27 MED ORDER — HYDRALAZINE HCL 50 MG PO TABS
50.0000 mg | ORAL_TABLET | Freq: Three times a day (TID) | ORAL | Status: DC
  Administered 2024-04-27 – 2024-04-29 (×6): 50 mg via ORAL
  Filled 2024-04-27 (×5): qty 1

## 2024-04-27 NOTE — Plan of Care (Signed)

## 2024-04-27 NOTE — Progress Notes (Signed)
 [facility]  ROUNDING NOTE   Subjective:   [name] is a 83 y.o. female with past medical conditions including diastolic heart failure, hypertension, hypothyroidism, COPD, and end stage renal disease on hemodialysis. Patient presents to the emergency department complaining of shortness of breath and has been admitted for CHF (congestive heart failure) (HCC) [I50.9] SOB (shortness of breath) [R06.02] Hypoxic [R09.02] Acute respiratory failure with hypoxia (HCC) [J96.01] ESRD on hemodialysis (HCC) [N18.6, Z99.2]  Patient is known to our practice and receives outpatient dialysis treatments at [facility], on a MWF schedule, supervised by Dr Marcelino.   Update: Patient seen laying in bed States she feels really tired today Reports rested well overnight, on CPAP   Objective:  Vital signs in last 24 hours:  Temp:  [97.6 F (36.4 C)-98.1 F (36.7 C)] 98.1 F (36.7 C) (07/27 1117) Pulse Rate:  [54-70] 68 (07/27 1117) Resp:  [18-21] 21 (07/27 1117) BP: (115-149)/(35-47) 141/44 (07/27 1117) SpO2:  [94 %-100 %] 94 % (07/27 1117) FiO2 (%):  [32 %] 32 % (07/26 2258) Weight:  [75.7 kg] 75.7 kg (07/27 0500)  Weight change: -3.3 kg Filed Weights   04/25/24 1652 04/26/24 0501 04/27/24 0500  Weight: 71.6 kg 71.8 kg 75.7 kg    Intake/Output: I/O last 3 completed shifts: In: 840 [P.O.:840] Out: -    Intake/Output this shift:  Total I/O In: 240 [P.O.:240] Out: -   Physical Exam: General: No acute distress  Head: Normocephalic, atraumatic. Moist oral mucosal membranes  Eyes: Anicteric  Neck: Supple  Lungs:  Clear to auscultation, NCO 2  Heart: Regular rate and rhythm  Abdomen:  Soft, nontender  Extremities: No peripheral edema.  Neurologic: Awake, alert, conversant  Skin: Warm,dry, no rash  Access: Left IJ PermCath, left upper aVF    Basic Metabolic Panel: Recent Labs  Lab 04/24/24 0633 04/25/24 0652 04/25/24 0850 04/26/24 0223 04/27/24 0426  NA  137 141 140 134* 134*  K 3.5 3.4* 3.4* 3.4* 3.9  CL 95* 98 97* 95* 96*  CO2 27 29 29 28 26   GLUCOSE 171* 95 93 99 94  BUN 17 26* 26* 29* 58*  CREATININE 2.18* 3.10* 3.16* 2.97* 4.31*  CALCIUM  9.0 9.3 9.2 9.2 9.1  MG 1.8  --   --  1.9  --   PHOS 3.0  --  4.1  --   --     Liver Function Tests: Recent Labs  Lab 04/24/24 0633 04/25/24 0850  AST 34  --   ALT 19  --   ALKPHOS 93  --   BILITOT 0.9  --   PROT 6.9  --   ALBUMIN 3.8 3.5   No results for input(s): LIPASE, AMYLASE in the last 168 hours. No results for input(s): AMMONIA in the last 168 hours.  CBC: Recent Labs  Lab 04/21/24 1516 04/24/24 0633 04/25/24 0850 04/26/24 0223  WBC  --  11.8* 6.3 7.6  NEUTROABS  --  4.5  --   --   HGB 7.6* 9.2* 8.5* 8.6*  HCT 23.4* 28.8* 26.4* 27.0*  MCV  --  94.1 92.3 91.8  PLT  --  207 166 166    Cardiac Enzymes: No results for input(s): CKTOTAL, CKMB, CKMBINDEX, TROPONINI in the last 168 hours.  BNP: Invalid input(s): POCBNP  CBG: No results for input(s): GLUCAP in the last 168 hours.  Microbiology: Results for orders placed or performed during the hospital encounter of 03/09/24  Resp panel by RT-PCR (RSV, Flu A&B,  Covid) Anterior Nasal Swab     Status: None   Collection Time: 03/09/24  7:18 PM   Specimen: Anterior Nasal Swab  Result Value Ref Range Status   SARS Coronavirus 2 by RT PCR NEGATIVE NEGATIVE Final    Comment: (NOTE) SARS-CoV-2 target nucleic acids are NOT DETECTED.  The SARS-CoV-2 RNA is generally detectable in upper respiratory specimens during the acute phase of infection. The lowest concentration of SARS-CoV-2 viral copies this assay can detect is 138 copies/mL. A negative result does not preclude SARS-Cov-2 infection and should not be used as the sole basis for treatment or other patient management decisions. A negative result may occur with  improper specimen collection/handling, submission of specimen other than nasopharyngeal  swab, presence of viral mutation(s) within the areas targeted by this assay, and inadequate number of viral copies(<138 copies/mL). A negative result must be combined with clinical observations, patient history, and epidemiological information. The expected result is Negative.  Fact Sheet for Patients:  BloggerCourse.com  Fact Sheet for Healthcare Providers:  SeriousBroker.it  This test is no t yet approved or cleared by the [place]  FDA and  has been authorized for detection and/or diagnosis of SARS-CoV-2 by FDA under an Emergency Use Authorization (EUA). This EUA will remain  in effect (meaning this test can be used) for the duration of the COVID-19 declaration under Section 564(b)(1) of the Act, 21 U.S.C.section 360bbb-3(b)(1), unless the authorization is terminated  or revoked sooner.       Influenza A by PCR NEGATIVE NEGATIVE Final   Influenza B by PCR NEGATIVE NEGATIVE Final    Comment: (NOTE) The Xpert Xpress SARS-CoV-2/FLU/RSV plus assay is intended as an aid in the diagnosis of influenza from Nasopharyngeal swab specimens and should not be used as a sole basis for treatment. Nasal washings and aspirates are unacceptable for Xpert Xpress SARS-CoV-2/FLU/RSV testing.  Fact Sheet for Patients: BloggerCourse.com  Fact Sheet for Healthcare Providers: SeriousBroker.it  This test is not yet approved or cleared by the [place]  FDA and has been authorized for detection and/or diagnosis of SARS-CoV-2 by FDA under an Emergency Use Authorization (EUA). This EUA will remain in effect (meaning this test can be used) for the duration of the COVID-19 declaration under Section 564(b)(1) of the Act, 21 U.S.C. section 360bbb-3(b)(1), unless the authorization is terminated or revoked.     Resp Syncytial Virus by PCR NEGATIVE NEGATIVE Final    Comment: (NOTE) Fact Sheet for  Patients: BloggerCourse.com  Fact Sheet for Healthcare Providers: SeriousBroker.it  This test is not yet approved or cleared by the [place]  FDA and has been authorized for detection and/or diagnosis of SARS-CoV-2 by FDA under an Emergency Use Authorization (EUA). This EUA will remain in effect (meaning this test can be used) for the duration of the COVID-19 declaration under Section 564(b)(1) of the Act, 21 U.S.C. section 360bbb-3(b)(1), unless the authorization is terminated or revoked.  Performed at [facility], [address] [place], [place] [address]   Respiratory (~20 pathogens) panel by PCR     Status: None   Collection Time: 03/09/24 10:00 PM   Specimen: Nasopharyngeal Swab; Respiratory  Result Value Ref Range Status   Adenovirus NOT DETECTED NOT DETECTED Final   Coronavirus 229E NOT DETECTED NOT DETECTED Final    Comment: (NOTE) The Coronavirus on the Respiratory Panel, DOES NOT test for the novel  Coronavirus (2019 nCoV)    Coronavirus HKU1 NOT DETECTED NOT DETECTED Final   Coronavirus NL63 NOT DETECTED NOT DETECTED  Final   Coronavirus OC43 NOT DETECTED NOT DETECTED Final   Metapneumovirus NOT DETECTED NOT DETECTED Final   Rhinovirus / Enterovirus NOT DETECTED NOT DETECTED Final   Influenza A NOT DETECTED NOT DETECTED Final   Influenza B NOT DETECTED NOT DETECTED Final   Parainfluenza Virus 1 NOT DETECTED NOT DETECTED Final   Parainfluenza Virus 2 NOT DETECTED NOT DETECTED Final   Parainfluenza Virus 3 NOT DETECTED NOT DETECTED Final   Parainfluenza Virus 4 NOT DETECTED NOT DETECTED Final   Respiratory Syncytial Virus NOT DETECTED NOT DETECTED Final   Bordetella pertussis NOT DETECTED NOT DETECTED Final   Bordetella Parapertussis NOT DETECTED NOT DETECTED Final   Chlamydophila pneumoniae NOT DETECTED NOT DETECTED Final   Mycoplasma pneumoniae NOT DETECTED NOT DETECTED Final    Comment: Performed at  [facility], [address], [place], [place] [address]    Coagulation Studies: No results for input(s): LABPROT, INR in the last 72 hours.  Urinalysis: No results for input(s): COLORURINE, LABSPEC, PHURINE, GLUCOSEU, HGBUR, BILIRUBINUR, KETONESUR, PROTEINUR, UROBILINOGEN, NITRITE, LEUKOCYTESUR in the last 72 hours.  Invalid input(s): APPERANCEUR    Imaging: No results found.    Medications:      allopurinol   100 mg Oral Daily   amLODipine   5 mg Oral Daily   apixaban   2.5 mg Oral BID   azelastine   2 spray Each Nare BID   Chlorhexidine  Gluconate Cloth  6 each Topical Q0600   [START ON 04/28/2024] epoetin  alfa-epbx (RETACRIT ) injection  4,000 Units Intravenous Q M,W,F-1800   fluticasone   2 spray Each Nare Daily   fluticasone  furoate-vilanterol  1 puff Inhalation Daily   hydrALAZINE   50 mg Oral TID   levothyroxine   150 mcg Oral q morning   metoprolol  tartrate  25 mg Oral BID   midodrine   5 mg Oral BID WC   montelukast   10 mg Oral QHS   pantoprazole   40 mg Oral Daily   rosuvastatin   40 mg Oral Daily   sertraline   100 mg Oral Daily   sodium chloride  flush  3 mL Intravenous Q12H   torsemide   40 mg Oral Daily   acetaminophen , albuterol , hydrALAZINE , ondansetron  (ZOFRAN ) IV, sodium chloride  flush  Assessment/ Plan:  [name] is a 83 y.o.  female with past medical conditions including diastolic heart failure, hypertension, hypothyroidism, COPD, and end stage renal disease on hemodialysis. Patient presents to the emergency department complaining of shortness of breath and has been admitted for CHF (congestive heart failure) (HCC) [I50.9] SOB (shortness of breath) [R06.02] Hypoxic [R09.02] Acute respiratory failure with hypoxia (HCC) [J96.01] ESRD on hemodialysis (HCC) [N18.6, Z99.2]   Acute respiratory failure requiring BiPAP at admission.  Patient weaned off BiPAP.  Remains on 2 L.    2.  End-stage renal disease on hemodialysis.   Next treatment scheduled for Monday.  3. Anemia of chronic kidney disease Lab Results  Component Value Date   HGB 8.6 (L) 04/26/2024  Will order low-dose EPO 4000 units with dialysis. Resume Mircera as an outpatient.  4.  Hypertension with chronic kidney disease.  Home regimen includes amlodipine , furosemide , hydralazine , metoprolol , and torsemide .  Continue amlodipine , hydralazine , metoprolol , and torsemide  currently.  Blood pressure slightly improved today, 141/44.  Primary team has decreased hydralazine .    LOS: 2 [name] 7/27/202512:14 PM

## 2024-04-27 NOTE — Progress Notes (Signed)
 Progress Note   Patient: [name] FMW:[id number] DOB: January 20, 1941 DOA: 04/24/2024     2 DOS: the patient was seen and examined on 04/27/2024   Brief hospital course:  83 y.o. 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  Found to be volume overload, HFpEF.  Pt was admitted for further evaluation and management including urgent dialysis for ultrafiltration.  Further hospital course and management as outlined below.   Assessment and Plan:  Acute respiratory failure with hypoxia Initially requiring BiPAP on presentation.   Secondary to volume overload, improving with ultrafiltration/dialysis.   Weaned to 2 L nasal cannula.   --Continue to wean as tolerated   Acute exacerbation HFpEF secondary to volume overload Progressive since beginning of the week prior to admission.   Initially required BiPAP for respiratory distress on presentation.   Improving with serial dialysis --Continue volume management by dialysis --Also on torsemide  40 mg daily -- held this AM (7/26) for low BP    End-stage renal disease Normal schedule MWF --Nephrology following   Hypotension -- 7/26 -- soft BP's with diastolic in 30's-40's and MAP's below 65 Hx of Hypertension --BP meds had to be held on 7/26 with MAPs < 65 and low DBP's --Added midodrine  5 mg BID WC - continue for now --Maintain MAP > 65 --Added hold parameters to BP meds shown below - hold for MAP< 65 --On home regimen: torsemide  40 mg daily, metop 25 mg BID, amlodipine  5 mg daily, hydralazine  100 mg TID --Will lower dose of hydralazine  from 100 >> 50 mg TID given low DBP's --Home regimen may require further de-escalating if low BP's are persistent --Hydralazine  PRN   COPD - stable, not exacerbated --PRN nebulized bronchodilators   Physical debilitation muscle weakness --PT/OT evaluations   Hypothyroidism - TSH normal.    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  No active bleeding appreciated. -- Monitor CBC      Subjective: Pt awake resting in bed on rounds this AM. She reports being tired today, but otherwise overall feeling better. Breathing has improved.  She reports O2 level dropping when she sleeps when not using O2.  Uses O2 with her CPAP during sleep at home.      Physical Exam: Vitals:   04/27/24 0604 04/27/24 0753 04/27/24 0907 04/27/24 1117  BP:  (!) 143/42  (!) 141/44  Pulse:  (!) 54  68  Resp:  20  (!) 21  Temp:  97.7 F (36.5 C)  98.1 F (36.7 C)  TempSrc:      SpO2: 100% 99% 94% 94%  Weight:      Height:       General exam: awake, alert, no acute distress HEENT: moist mucus membranes, hearing grossly normal  Respiratory system: CTAB diminished bases, no wheezes, rales or rhonchi, normal respiratory effort at rest on 2 L/min [place] o2. Cardiovascular system: normal S1/S2, RRR, no peripheral edema.   Gastrointestinal system: soft, NT, ND, no HSM felt, +bowel sounds. Central nervous system: A&O x3. no gross focal neurologic deficits, normal speech Extremities: moves all, no edema, normal tone Skin: dry, intact, normal temperature Psychiatry: normal mood, congruent affect, judgement and insight appear normal    Data Reviewed:  Notable labs -- Na 134, Cl 96, BUN 58, Cr 4.31, Last Hbg 8.6  Family Communication: None at bedside, will attempt to call as time allows  Disposition: Status  is: Inpatient Remains inpatient appropriate because: needs further clinical improvement and weaning oxygen.   Planned Discharge Destination: Home with Home Health    Time spent: 40 minutes  Author: Burnard DELENA Cunning, DO 04/27/2024 11:55 AM  For on call review [email or web address].

## 2024-04-28 DIAGNOSIS — I5033 Acute on chronic diastolic (congestive) heart failure: Secondary | ICD-10-CM | POA: Diagnosis not present

## 2024-04-28 LAB — RENAL FUNCTION PANEL
Albumin: 3.3 g/dL — ABNORMAL LOW (ref 3.5–5.0)
Anion gap: 14 (ref 5–15)
BUN: 71 mg/dL — ABNORMAL HIGH (ref 8–23)
CO2: 25 mmol/L (ref 22–32)
Calcium: 9.1 mg/dL (ref 8.9–10.3)
Chloride: 100 mmol/L (ref 98–111)
Creatinine, Ser: 4.57 mg/dL — ABNORMAL HIGH (ref 0.44–1.00)
GFR, Estimated: 9 mL/min — ABNORMAL LOW (ref >60)
Glucose, Bld: 91 mg/dL (ref 70–99)
Phosphorus: 4.4 mg/dL (ref 2.5–4.6)
Potassium: 4.3 mmol/L (ref 3.5–5.1)
Sodium: 139 mmol/L (ref 135–145)

## 2024-04-28 MED ORDER — HEPARIN SODIUM (PORCINE) 1000 UNIT/ML DIALYSIS: 1000.0000 [IU] | INTRAMUSCULAR | Status: DC | PRN

## 2024-04-28 MED ORDER — EPOETIN ALFA-EPBX 4000 UNIT/ML IJ SOLN
INTRAMUSCULAR | Status: AC
  Filled 2024-04-28: qty 1

## 2024-04-28 MED ORDER — ORAL CARE MOUTH RINSE: 15.0000 mL | OROMUCOSAL | Status: DC | PRN

## 2024-04-28 MED ORDER — METHOCARBAMOL 500 MG PO TABS
750.0000 mg | ORAL_TABLET | Freq: Three times a day (TID) | ORAL | Status: DC | PRN
  Administered 2024-04-28 – 2024-04-29 (×2): 750 mg via ORAL
  Filled 2024-04-28 (×3): qty 2

## 2024-04-28 MED ORDER — HEPARIN SODIUM (PORCINE) 1000 UNIT/ML IJ SOLN
INTRAMUSCULAR | Status: AC
Start: 2024-04-28 — End: 2024-04-28
  Filled 2024-04-28: qty 10

## 2024-04-28 MED ORDER — ALUM & MAG HYDROXIDE-SIMETH 200-200-20 MG/5ML PO SUSP: 15.0000 mL | Freq: Four times a day (QID) | ORAL | Status: DC | PRN

## 2024-04-28 MED ORDER — ALTEPLASE 2 MG IJ SOLR: 2.0000 mg | Freq: Once | INTRAMUSCULAR | Status: DC | PRN

## 2024-04-28 NOTE — Plan of Care (Signed)

## 2024-04-28 NOTE — Progress Notes (Signed)
 PT Cancellation Note  Patient Details Name: CATHARINE KETTLEWELL MRN: [id number] DOB: 07-Sep-1941   Cancelled Treatment:    Reason Eval/Treat Not Completed: Patient at procedure or test/unavailable. Will plan for PT session tomorrow, pt currently in HD.   Lauriel Helin 04/28/2024, 2:07 PM Corean Dade, PT, DPT, [facility] [phone]

## 2024-04-28 NOTE — Progress Notes (Signed)
 Progress Note   Patient: [name] FMW:[id number] DOB: 1941-07-16 DOA: 04/24/2024     3 DOS: the patient was seen and examined on 04/28/2024   Brief hospital course:  83 y.o. 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  Found to be volume overload, HFpEF.  Pt was admitted for further evaluation and management including urgent dialysis for ultrafiltration.  Further hospital course and management as outlined below.   Assessment and Plan:  Acute respiratory failure with hypoxia Initially requiring BiPAP on presentation.   Secondary to volume overload, improving with ultrafiltration/dialysis.   Weaned to 2 L nasal cannula.   --Continue to wean as tolerated --CPAP when sleeping   Acute exacerbation HFpEF secondary to volume overload Progressive since beginning of the week prior to admission.   Initially required BiPAP for respiratory distress on presentation.   Improving with serial dialysis --Continue volume management by dialysis --Also on torsemide  40 mg daily     End-stage renal disease Normal schedule MWF --Nephrology following --For dialysis today   Hypotension --  7/26 -- soft BP's with diastolic in 30's-40's and MAP's below 65 7/27--28 -- BP's better, DBP's remain soft but mostly 40's to 50's Hx of Hypertension --BP meds had to be held on 7/26 with MAPs < 65 and low DBP's --Added midodrine  5 mg BID WC - continue for now --Maintain MAP > 65 --Added hold parameters to BP meds - hold for MAP< 65 --On home regimen: torsemide  40 mg daily, metop 25 mg BID, amlodipine  5 mg daily, hydralazine  100 mg TID --Lowered dose of hydralazine  from 100 >> 50 mg TID given low DBP's --Home regimen may require further de-escalating if low BP's are persistent --Hydralazine  PRN   COPD - stable, not exacerbated --PRN nebulized bronchodilators   Physical debilitation  muscle weakness --PT/OT evaluations   Hypothyroidism - TSH normal.   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  No active bleeding appreciated. -- Monitor CBC      Subjective: Pt awake resting in bed on rounds this AM. RT to room with a neb treatment.  Pt reports back pain this AM, describes at tightness and aching.  RN giving Tylenol .  Pt unsure what she did but may have pulled something when repositioning in the bed.  No other acute complaints.       Physical Exam: Vitals:   04/28/24 0734 04/28/24 0930 04/28/24 1109 04/28/24 1129  BP: (!) 140/50   (!) 119/37  Pulse: 62   61  Resp: 14   18  Temp: 98.4 F (36.9 C)   97.8 F (36.6 C)  TempSrc:      SpO2: 97% 96% 97% 97%  Weight:      Height:       General exam: awake, alert, no acute distress HEENT: moist mucus membranes, hearing grossly normal  Respiratory system: CTAB diminished bases, no wheezes, rales or rhonchi, normal respiratory effort at rest on 2 L/min [place] o2. Cardiovascular system: normal S1/S2, RRR, no peripheral edema.   Gastrointestinal system: soft, NT, ND, no HSM felt, +bowel sounds. Central nervous system: A&O x3. no gross focal neurologic deficits, normal speech Extremities: moves all, no edema, normal tone Skin: dry, intact, normal temperature Psychiatry: normal mood, congruent affect, judgement and insight appear normal    Data Reviewed:  Notable labs -- Na 134, Cl 96, BUN 58, Cr 4.31, Last Hbg 8.6  Family Communication: None at bedside, will attempt to call as time allows  Disposition: Status is: Inpatient Remains inpatient appropriate because: needs further clinical improvement and weaning oxygen.   Planned Discharge Destination: Home with Home Health    Time spent: 40 minutes  Author: Burnard DELENA Cunning, DO 04/28/2024 12:36 PM  For on call review [email or web address].

## 2024-04-28 NOTE — Progress Notes (Signed)
 Pt sats 96% on RA while walking

## 2024-04-28 NOTE — Progress Notes (Signed)
  Received patient in bed to unit.   Informed consent signed and in chart.    TX duration: 3:15     Transported back to floor Hand-off given to patient's nurse. No acute distress noted    Access used: L HD Catheter  dressing changed Access issues: none   Total UF removed: 1.0L Medication(s) given: retacrit  Post HD VS: wnl  Post HD weight: 72.6kg     Olivia [name] LPN [facility]

## 2024-04-28 NOTE — Progress Notes (Signed)
 [facility]  ROUNDING NOTE   Subjective:   [name] is a 83 y.o. female with past medical conditions including diastolic heart failure, hypertension, hypothyroidism, COPD, and end stage renal disease on hemodialysis. Patient presents to the emergency department complaining of shortness of breath and has been admitted for CHF (congestive heart failure) (HCC) [I50.9] SOB (shortness of breath) [R06.02] Hypoxic [R09.02] Acute respiratory failure with hypoxia (HCC) [J96.01] ESRD on hemodialysis (HCC) [N18.6, Z99.2]  Patient is known to our practice and receives outpatient dialysis treatments at [facility], on a MWF schedule, supervised by Dr Marcelino.   Update:  Patient seen sitting up in bed Continues to have fatigue Denies pain  Dialysis scheduled for later today  Objective:  Vital signs in last 24 hours:  Temp:  [97.7 F (36.5 C)-98.4 F (36.9 C)] 97.8 F (36.6 C) (07/28 1129) Pulse Rate:  [59-69] 61 (07/28 1129) Resp:  [14-18] 18 (07/28 1129) BP: (119-144)/(37-52) 119/37 (07/28 1129) SpO2:  [95 %-100 %] 97 % (07/28 1129) FiO2 (%):  [28 %] 28 % (07/27 2255) Weight:  [71.4 kg] 71.4 kg (07/28 0500)  Weight change: -4.3 kg Filed Weights   04/26/24 0501 04/27/24 0500 04/28/24 0500  Weight: 71.8 kg 75.7 kg 71.4 kg    Intake/Output: I/O last 3 completed shifts: In: 481 [P.O.:481] Out: -    Intake/Output this shift:  Total I/O In: 240 [P.O.:240] Out: -   Physical Exam: General: No acute distress  Head: Normocephalic, atraumatic. Moist oral mucosal membranes  Eyes: Anicteric  Neck: Supple  Lungs:  Clear to auscultation, NCO 2  Heart: Regular rate and rhythm  Abdomen:  Soft, nontender  Extremities: No peripheral edema.  Neurologic: Awake, alert, conversant  Skin: Warm,dry, no rash  Access: Left IJ PermCath, left upper aVF    Basic Metabolic Panel: Recent Labs  Lab 04/24/24 0633 04/25/24 0652 04/25/24 0850 04/26/24 0223 04/27/24 0426  04/28/24 0352  NA 137 141 140 134* 134* 139  K 3.5 3.4* 3.4* 3.4* 3.9 4.3  CL 95* 98 97* 95* 96* 100  CO2 27 29 29 28 26 25   GLUCOSE 171* 95 93 99 94 91  BUN 17 26* 26* 29* 58* 71*  CREATININE 2.18* 3.10* 3.16* 2.97* 4.31* 4.57*  CALCIUM  9.0 9.3 9.2 9.2 9.1 9.1  MG 1.8  --   --  1.9  --   --   PHOS 3.0  --  4.1  --   --  4.4    Liver Function Tests: Recent Labs  Lab 04/24/24 0633 04/25/24 0850 04/28/24 0352  AST 34  --   --   ALT 19  --   --   ALKPHOS 93  --   --   BILITOT 0.9  --   --   PROT 6.9  --   --   ALBUMIN 3.8 3.5 3.3*   No results for input(s): LIPASE, AMYLASE in the last 168 hours. No results for input(s): AMMONIA in the last 168 hours.  CBC: Recent Labs  Lab 04/21/24 1516 04/24/24 0633 04/25/24 0850 04/26/24 0223  WBC  --  11.8* 6.3 7.6  NEUTROABS  --  4.5  --   --   HGB 7.6* 9.2* 8.5* 8.6*  HCT 23.4* 28.8* 26.4* 27.0*  MCV  --  94.1 92.3 91.8  PLT  --  207 166 166    Cardiac Enzymes: No results for input(s): CKTOTAL, CKMB, CKMBINDEX, TROPONINI in the last 168 hours.  BNP: Invalid input(s): POCBNP  CBG: No results for input(s): GLUCAP in the last 168 hours.  Microbiology: Results for orders placed or performed during the hospital encounter of 03/09/24  Resp panel by RT-PCR (RSV, Flu A&B, Covid) Anterior Nasal Swab     Status: None   Collection Time: 03/09/24  7:18 PM   Specimen: Anterior Nasal Swab  Result Value Ref Range Status   SARS Coronavirus 2 by RT PCR NEGATIVE NEGATIVE Final    Comment: (NOTE) SARS-CoV-2 target nucleic acids are NOT DETECTED.  The SARS-CoV-2 RNA is generally detectable in upper respiratory specimens during the acute phase of infection. The lowest concentration of SARS-CoV-2 viral copies this assay can detect is 138 copies/mL. A negative result does not preclude SARS-Cov-2 infection and should not be used as the sole basis for treatment or other patient management decisions. A negative result  may occur with  improper specimen collection/handling, submission of specimen other than nasopharyngeal swab, presence of viral mutation(s) within the areas targeted by this assay, and inadequate number of viral copies(<138 copies/mL). A negative result must be combined with clinical observations, patient history, and epidemiological information. The expected result is Negative.  Fact Sheet for Patients:  BloggerCourse.com  Fact Sheet for Healthcare Providers:  SeriousBroker.it  This test is no t yet approved or cleared by the [place]  FDA and  has been authorized for detection and/or diagnosis of SARS-CoV-2 by FDA under an Emergency Use Authorization (EUA). This EUA will remain  in effect (meaning this test can be used) for the duration of the COVID-19 declaration under Section 564(b)(1) of the Act, 21 U.S.C.section 360bbb-3(b)(1), unless the authorization is terminated  or revoked sooner.       Influenza A by PCR NEGATIVE NEGATIVE Final   Influenza B by PCR NEGATIVE NEGATIVE Final    Comment: (NOTE) The Xpert Xpress SARS-CoV-2/FLU/RSV plus assay is intended as an aid in the diagnosis of influenza from Nasopharyngeal swab specimens and should not be used as a sole basis for treatment. Nasal washings and aspirates are unacceptable for Xpert Xpress SARS-CoV-2/FLU/RSV testing.  Fact Sheet for Patients: BloggerCourse.com  Fact Sheet for Healthcare Providers: SeriousBroker.it  This test is not yet approved or cleared by the [place]  FDA and has been authorized for detection and/or diagnosis of SARS-CoV-2 by FDA under an Emergency Use Authorization (EUA). This EUA will remain in effect (meaning this test can be used) for the duration of the COVID-19 declaration under Section 564(b)(1) of the Act, 21 U.S.C. section 360bbb-3(b)(1), unless the authorization is terminated  or revoked.     Resp Syncytial Virus by PCR NEGATIVE NEGATIVE Final    Comment: (NOTE) Fact Sheet for Patients: BloggerCourse.com  Fact Sheet for Healthcare Providers: SeriousBroker.it  This test is not yet approved or cleared by the [place]  FDA and has been authorized for detection and/or diagnosis of SARS-CoV-2 by FDA under an Emergency Use Authorization (EUA). This EUA will remain in effect (meaning this test can be used) for the duration of the COVID-19 declaration under Section 564(b)(1) of the Act, 21 U.S.C. section 360bbb-3(b)(1), unless the authorization is terminated or revoked.  Performed at [facility], [address] [place], [place] [address]   Respiratory (~20 pathogens) panel by PCR     Status: None   Collection Time: 03/09/24 10:00 PM   Specimen: Nasopharyngeal Swab; Respiratory  Result Value Ref Range Status   Adenovirus NOT DETECTED NOT DETECTED Final   Coronavirus 229E NOT DETECTED NOT DETECTED Final    Comment: (NOTE) The  Coronavirus on the Respiratory Panel, DOES NOT test for the novel  Coronavirus (2019 nCoV)    Coronavirus HKU1 NOT DETECTED NOT DETECTED Final   Coronavirus NL63 NOT DETECTED NOT DETECTED Final   Coronavirus OC43 NOT DETECTED NOT DETECTED Final   Metapneumovirus NOT DETECTED NOT DETECTED Final   Rhinovirus / Enterovirus NOT DETECTED NOT DETECTED Final   Influenza A NOT DETECTED NOT DETECTED Final   Influenza B NOT DETECTED NOT DETECTED Final   Parainfluenza Virus 1 NOT DETECTED NOT DETECTED Final   Parainfluenza Virus 2 NOT DETECTED NOT DETECTED Final   Parainfluenza Virus 3 NOT DETECTED NOT DETECTED Final   Parainfluenza Virus 4 NOT DETECTED NOT DETECTED Final   Respiratory Syncytial Virus NOT DETECTED NOT DETECTED Final   Bordetella pertussis NOT DETECTED NOT DETECTED Final   Bordetella Parapertussis NOT DETECTED NOT DETECTED Final   Chlamydophila pneumoniae NOT  DETECTED NOT DETECTED Final   Mycoplasma pneumoniae NOT DETECTED NOT DETECTED Final    Comment: Performed at [facility], [address] [place], [place] [address]    Coagulation Studies: No results for input(s): LABPROT, INR in the last 72 hours.  Urinalysis: No results for input(s): COLORURINE, LABSPEC, PHURINE, GLUCOSEU, HGBUR, BILIRUBINUR, KETONESUR, PROTEINUR, UROBILINOGEN, NITRITE, LEUKOCYTESUR in the last 72 hours.  Invalid input(s): APPERANCEUR    Imaging: No results found.    Medications:      allopurinol   100 mg Oral Daily   amLODipine   5 mg Oral Daily   apixaban   2.5 mg Oral BID   azelastine   2 spray Each Nare BID   Chlorhexidine  Gluconate Cloth  6 each Topical Q0600   epoetin  alfa-epbx (RETACRIT ) injection  4,000 Units Intravenous Q M,W,F-1800   fluticasone   2 spray Each Nare Daily   fluticasone  furoate-vilanterol  1 puff Inhalation Daily   hydrALAZINE   50 mg Oral TID   levothyroxine   150 mcg Oral q morning   metoprolol  tartrate  25 mg Oral BID   midodrine   5 mg Oral BID WC   montelukast   10 mg Oral QHS   pantoprazole   40 mg Oral Daily   rosuvastatin   40 mg Oral Daily   sertraline   100 mg Oral Daily   sodium chloride  flush  3 mL Intravenous Q12H   torsemide   40 mg Oral Daily   acetaminophen , albuterol , alteplase , alum & mag hydroxide-simeth, heparin , hydrALAZINE , methocarbamol , ondansetron  (ZOFRAN ) IV, mouth rinse, sodium chloride  flush  Assessment/ Plan:  [name] is a 83 y.o.  female with past medical conditions including diastolic heart failure, hypertension, hypothyroidism, COPD, and end stage renal disease on hemodialysis. Patient presents to the emergency department complaining of shortness of breath and has been admitted for CHF (congestive heart failure) (HCC) [I50.9] SOB (shortness of breath) [R06.02] Hypoxic [R09.02] Acute respiratory failure with hypoxia (HCC) [J96.01] ESRD on hemodialysis (HCC)  [N18.6, Z99.2]   Acute respiratory failure requiring BiPAP at admission.  Patient weaned off BiPAP.  Weaned to room air  2.  End-stage renal disease on hemodialysis.  Scheduled for dialysis later today.   3. Anemia of chronic kidney disease Lab Results  Component Value Date   HGB 8.6 (L) 04/26/2024  Low-dose EPO 4000 units with dialysis. Resume Mircera as an outpatient.  4.  Hypertension with chronic kidney disease.  Home regimen includes amlodipine , furosemide , hydralazine , metoprolol , and torsemide .  Continue amlodipine , hydralazine , metoprolol , and torsemide  currently. Primary team has decreased hydralazine .     LOS: 3 Stoney Karczewski 7/28/20251:13 PM

## 2024-04-29 DIAGNOSIS — I5033 Acute on chronic diastolic (congestive) heart failure: Secondary | ICD-10-CM | POA: Diagnosis not present

## 2024-04-29 MED ORDER — HYDRALAZINE HCL 25 MG PO TABS
25.0000 mg | ORAL_TABLET | Freq: Three times a day (TID) | ORAL | Status: DC
  Administered 2024-04-29 – 2024-05-01 (×4): 25 mg via ORAL
  Filled 2024-04-29 (×6): qty 1

## 2024-04-29 NOTE — Progress Notes (Signed)
 [facility]  ROUNDING NOTE   Subjective:   [name] is a 83 y.o. female with past medical conditions including diastolic heart failure, hypertension, hypothyroidism, COPD, and end stage renal disease on hemodialysis. Patient presents to the emergency department complaining of shortness of breath and has been admitted for CHF (congestive heart failure) (HCC) [I50.9] SOB (shortness of breath) [R06.02] Hypoxic [R09.02] Acute respiratory failure with hypoxia (HCC) [J96.01] ESRD on hemodialysis (HCC) [N18.6, Z99.2]  Patient is known to our practice and receives outpatient dialysis treatments at [facility], on a MWF schedule, supervised by Dr Marcelino.   Update:  Patient sitting up in chair States she feels well today Denies pain  Objective:  Vital signs in last 24 hours:  Temp:  [97.7 F (36.5 C)-98.5 F (36.9 C)] 97.7 F (36.5 C) (07/29 1156) Pulse Rate:  [56-69] 57 (07/29 1156) Resp:  [14-22] 18 (07/29 1156) BP: (104-147)/(31-57) 139/41 (07/29 1156) SpO2:  [94 %-100 %] 96 % (07/29 1156) FiO2 (%):  [21 %] 21 % (07/28 2358) Weight:  [72.6 kg-72.9 kg] 72.9 kg (07/29 0501)  Weight change: 3.3 kg Filed Weights   04/28/24 1315 04/28/24 1709 04/29/24 0501  Weight: 74.7 kg 72.6 kg 72.9 kg    Intake/Output: I/O last 3 completed shifts: In: 240 [P.O.:240] Out: 1000 [Other:1000]   Intake/Output this shift:  Total I/O In: 240 [P.O.:240] Out: -   Physical Exam: General: No acute distress  Head: Normocephalic, atraumatic. Moist oral mucosal membranes  Eyes: Anicteric  Neck: Supple  Lungs:  Clear to auscultation, NCO 2  Heart: Regular rate and rhythm  Abdomen:  Soft, nontender  Extremities: No peripheral edema.  Neurologic: Awake, alert, conversant  Skin: Warm,dry, no rash  Access: Left IJ PermCath, left upper aVF    Basic Metabolic Panel: Recent Labs  Lab 04/24/24 0633 04/25/24 0652 04/25/24 0850 04/26/24 0223 04/27/24 0426 04/28/24 0352   NA 137 141 140 134* 134* 139  K 3.5 3.4* 3.4* 3.4* 3.9 4.3  CL 95* 98 97* 95* 96* 100  CO2 27 29 29 28 26 25   GLUCOSE 171* 95 93 99 94 91  BUN 17 26* 26* 29* 58* 71*  CREATININE 2.18* 3.10* 3.16* 2.97* 4.31* 4.57*  CALCIUM  9.0 9.3 9.2 9.2 9.1 9.1  MG 1.8  --   --  1.9  --   --   PHOS 3.0  --  4.1  --   --  4.4    Liver Function Tests: Recent Labs  Lab 04/24/24 0633 04/25/24 0850 04/28/24 0352  AST 34  --   --   ALT 19  --   --   ALKPHOS 93  --   --   BILITOT 0.9  --   --   PROT 6.9  --   --   ALBUMIN 3.8 3.5 3.3*   No results for input(s): LIPASE, AMYLASE in the last 168 hours. No results for input(s): AMMONIA in the last 168 hours.  CBC: Recent Labs  Lab 04/24/24 0633 04/25/24 0850 04/26/24 0223  WBC 11.8* 6.3 7.6  NEUTROABS 4.5  --   --   HGB 9.2* 8.5* 8.6*  HCT 28.8* 26.4* 27.0*  MCV 94.1 92.3 91.8  PLT 207 166 166    Cardiac Enzymes: No results for input(s): CKTOTAL, CKMB, CKMBINDEX, TROPONINI in the last 168 hours.  BNP: Invalid input(s): POCBNP  CBG: No results for input(s): GLUCAP in the last 168 hours.  Microbiology: Results for orders placed or performed during the  hospital encounter of 03/09/24  Resp panel by RT-PCR (RSV, Flu A&B, Covid) Anterior Nasal Swab     Status: None   Collection Time: 03/09/24  7:18 PM   Specimen: Anterior Nasal Swab  Result Value Ref Range Status   SARS Coronavirus 2 by RT PCR NEGATIVE NEGATIVE Final    Comment: (NOTE) SARS-CoV-2 target nucleic acids are NOT DETECTED.  The SARS-CoV-2 RNA is generally detectable in upper respiratory specimens during the acute phase of infection. The lowest concentration of SARS-CoV-2 viral copies this assay can detect is 138 copies/mL. A negative result does not preclude SARS-Cov-2 infection and should not be used as the sole basis for treatment or other patient management decisions. A negative result may occur with  improper specimen collection/handling,  submission of specimen other than nasopharyngeal swab, presence of viral mutation(s) within the areas targeted by this assay, and inadequate number of viral copies(<138 copies/mL). A negative result must be combined with clinical observations, patient history, and epidemiological information. The expected result is Negative.  Fact Sheet for Patients:  BloggerCourse.com  Fact Sheet for Healthcare Providers:  SeriousBroker.it  This test is no t yet approved or cleared by the [place]  FDA and  has been authorized for detection and/or diagnosis of SARS-CoV-2 by FDA under an Emergency Use Authorization (EUA). This EUA will remain  in effect (meaning this test can be used) for the duration of the COVID-19 declaration under Section 564(b)(1) of the Act, 21 U.S.C.section 360bbb-3(b)(1), unless the authorization is terminated  or revoked sooner.       Influenza A by PCR NEGATIVE NEGATIVE Final   Influenza B by PCR NEGATIVE NEGATIVE Final    Comment: (NOTE) The Xpert Xpress SARS-CoV-2/FLU/RSV plus assay is intended as an aid in the diagnosis of influenza from Nasopharyngeal swab specimens and should not be used as a sole basis for treatment. Nasal washings and aspirates are unacceptable for Xpert Xpress SARS-CoV-2/FLU/RSV testing.  Fact Sheet for Patients: BloggerCourse.com  Fact Sheet for Healthcare Providers: SeriousBroker.it  This test is not yet approved or cleared by the [place]  FDA and has been authorized for detection and/or diagnosis of SARS-CoV-2 by FDA under an Emergency Use Authorization (EUA). This EUA will remain in effect (meaning this test can be used) for the duration of the COVID-19 declaration under Section 564(b)(1) of the Act, 21 U.S.C. section 360bbb-3(b)(1), unless the authorization is terminated or revoked.     Resp Syncytial Virus by PCR NEGATIVE  NEGATIVE Final    Comment: (NOTE) Fact Sheet for Patients: BloggerCourse.com  Fact Sheet for Healthcare Providers: SeriousBroker.it  This test is not yet approved or cleared by the [place]  FDA and has been authorized for detection and/or diagnosis of SARS-CoV-2 by FDA under an Emergency Use Authorization (EUA). This EUA will remain in effect (meaning this test can be used) for the duration of the COVID-19 declaration under Section 564(b)(1) of the Act, 21 U.S.C. section 360bbb-3(b)(1), unless the authorization is terminated or revoked.  Performed at [facility], [address] [place], [place] [address]   Respiratory (~20 pathogens) panel by PCR     Status: None   Collection Time: 03/09/24 10:00 PM   Specimen: Nasopharyngeal Swab; Respiratory  Result Value Ref Range Status   Adenovirus NOT DETECTED NOT DETECTED Final   Coronavirus 229E NOT DETECTED NOT DETECTED Final    Comment: (NOTE) The Coronavirus on the Respiratory Panel, DOES NOT test for the novel  Coronavirus (2019 nCoV)    Coronavirus HKU1 NOT  DETECTED NOT DETECTED Final   Coronavirus NL63 NOT DETECTED NOT DETECTED Final   Coronavirus OC43 NOT DETECTED NOT DETECTED Final   Metapneumovirus NOT DETECTED NOT DETECTED Final   Rhinovirus / Enterovirus NOT DETECTED NOT DETECTED Final   Influenza A NOT DETECTED NOT DETECTED Final   Influenza B NOT DETECTED NOT DETECTED Final   Parainfluenza Virus 1 NOT DETECTED NOT DETECTED Final   Parainfluenza Virus 2 NOT DETECTED NOT DETECTED Final   Parainfluenza Virus 3 NOT DETECTED NOT DETECTED Final   Parainfluenza Virus 4 NOT DETECTED NOT DETECTED Final   Respiratory Syncytial Virus NOT DETECTED NOT DETECTED Final   Bordetella pertussis NOT DETECTED NOT DETECTED Final   Bordetella Parapertussis NOT DETECTED NOT DETECTED Final   Chlamydophila pneumoniae NOT DETECTED NOT DETECTED Final   Mycoplasma pneumoniae NOT  DETECTED NOT DETECTED Final    Comment: Performed at [facility], [address] [place], [place] [address]    Coagulation Studies: No results for input(s): LABPROT, INR in the last 72 hours.  Urinalysis: No results for input(s): COLORURINE, LABSPEC, PHURINE, GLUCOSEU, HGBUR, BILIRUBINUR, KETONESUR, PROTEINUR, UROBILINOGEN, NITRITE, LEUKOCYTESUR in the last 72 hours.  Invalid input(s): APPERANCEUR    Imaging: No results found.    Medications:      allopurinol   100 mg Oral Daily   amLODipine   5 mg Oral Daily   apixaban   2.5 mg Oral BID   azelastine   2 spray Each Nare BID   Chlorhexidine  Gluconate Cloth  6 each Topical Q0600   epoetin  alfa-epbx (RETACRIT ) injection  4,000 Units Intravenous Q M,W,F-1800   fluticasone   2 spray Each Nare Daily   fluticasone  furoate-vilanterol  1 puff Inhalation Daily   hydrALAZINE   25 mg Oral TID   levothyroxine   150 mcg Oral q morning   metoprolol  tartrate  25 mg Oral BID   montelukast   10 mg Oral QHS   pantoprazole   40 mg Oral Daily   rosuvastatin   40 mg Oral Daily   sertraline   100 mg Oral Daily   sodium chloride  flush  3 mL Intravenous Q12H   torsemide   40 mg Oral Daily   acetaminophen , albuterol , alum & mag hydroxide-simeth, hydrALAZINE , methocarbamol , ondansetron  (ZOFRAN ) IV, mouth rinse, sodium chloride  flush  Assessment/ Plan:  Ms. ZAKIAH GAUTHREAUX is a 83 y.o.  female with past medical conditions including diastolic heart failure, hypertension, hypothyroidism, COPD, and end stage renal disease on hemodialysis. Patient presents to the emergency department complaining of shortness of breath and has been admitted for CHF (congestive heart failure) (HCC) [I50.9] SOB (shortness of breath) [R06.02] Hypoxic [R09.02] Acute respiratory failure with hypoxia (HCC) [J96.01] ESRD on hemodialysis (HCC) [N18.6, Z99.2]   Acute respiratory failure requiring BiPAP at admission.  Patient weaned off BiPAP.  Remains  on room air  2.  End-stage renal disease on hemodialysis.  Dialysis received yesterday, UF 1 L achieved.  Next treatment scheduled for Wednesday  3. Anemia of chronic kidney disease Lab Results  Component Value Date   HGB 8.6 (L) 04/26/2024  Low-dose EPO 4000 units with dialysis. Resume Mircera as an outpatient.  4.  Hypertension with chronic kidney disease.  Home regimen includes amlodipine , furosemide , hydralazine , metoprolol , and torsemide .  Continue amlodipine , hydralazine , metoprolol , and torsemide  currently.  Blood pressure currently 139/41.    LOS: 4 Latora Quarry 7/29/20251:37 PM

## 2024-04-29 NOTE — Progress Notes (Signed)
 Progress Note   Patient: [name] FMW:[id number] DOB: November 12, 1940 DOA: 04/24/2024     4 DOS: the patient was seen and examined on 04/29/2024   Brief hospital course:  83 y.o. 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  Found to be volume overload, HFpEF.  Pt was admitted for further evaluation and management including urgent dialysis for ultrafiltration.  Further hospital course and management as outlined below.   Assessment and Plan:  Acute respiratory failure with hypoxia Initially requiring BiPAP on presentation.   Secondary to volume overload, improving with ultrafiltration/dialysis.   Weaned to 2 L nasal cannula.   --Continue to wean as tolerated --CPAP when sleeping   Acute exacerbation HFpEF secondary to volume overload Progressive since beginning of the week prior to admission.   Initially required BiPAP for respiratory distress on presentation.   Improving with serial dialysis --Continue volume management by dialysis --Also on torsemide  40 mg daily     End-stage renal disease Normal schedule MWF --Nephrology following --For dialysis today   Hypotension --  7/26 -- soft BP's with diastolic in 30's-40's and MAP's below 65 7/27--28 -- BP's better, DBP's remain soft but mostly 40's to 50's 7/29 -- stopping midodrine , further reduced hydralazine .  DBP's more stable in 40's-50's, pt less dizzy Hx of Hypertension --BP meds had to be held on 7/26 with MAPs < 65 and low DBP's --Trial off midodrine  (5 mg BID WC) with de-escalation of regimen as below --Maintain MAP > 65 --Hold parameters for BP meds if MAP< 65 --On home regimen: torsemide  40 mg daily, metop 25 mg BID, amlodipine  5 mg daily, hydralazine  100 mg TID --Lowered dose of hydralazine  from 100 >> 50 >> 25 mg TID --Home regimen may require further de-escalating if low BP's are  persistent --Hydralazine  PRN   COPD - stable, not exacerbated --PRN nebulized bronchodilators   Physical debilitation muscle weakness --PT/OT evaluations   Hypothyroidism - TSH normal.   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  No active bleeding appreciated. -- Monitor CBC      Subjective: Pt awake resting in bed this AM. She reports not sleeping well last night, tired today.  Otherwise feeling better, no shortness of breath.  Getting up to bathroom on her own.     Physical Exam: Vitals:   04/29/24 0411 04/29/24 0501 04/29/24 0738 04/29/24 1156  BP: (!) 145/53  (!) 147/57 (!) 139/41  Pulse: 62  61 (!) 57  Resp: 20  14 18   Temp: 98 F (36.7 C)  98.1 F (36.7 C) 97.7 F (36.5 C)  TempSrc:      SpO2: 95%  97% 96%  Weight:  72.9 kg    Height:       General exam: awake, alert, no acute distress HEENT: moist mucus membranes, hearing grossly normal  Respiratory system: on room air, normal respiratory effort at rest  Cardiovascular system: normal S1/S2, RRR, no peripheral edema.   Gastrointestinal system: soft, NT, ND Central nervous system: A&O x3. no gross focal neurologic deficits, normal speech Extremities: moves all, no edema Psychiatry: normal mood, congruent affect, judgement and insight appear normal    Data Reviewed:  Notable labs -- BMP with BUN 71, Cr 4.57, Last Hbg stable 8.6  Family Communication: None at bedside. Pt updated in detail.  Disposition: Status is: Inpatient Remains inpatient appropriate because: closely monitoring BP's with medication  adjustments above.  Possible d/c in 24 hours.   Planned Discharge Destination: Home with Home Health    Time spent: 38 minutes  Author: Burnard DELENA Cunning, DO 04/29/2024 1:19 PM  For on call review [email or web address].

## 2024-04-29 NOTE — Progress Notes (Signed)
 Physical Therapy Treatment Patient Details Name: [name] MRN: [id number] DOB: Jul 18, 1941 Today's Date: 04/29/2024   History of Present Illness Pt is 83 y.o. female with medical history significant of ESRD on HD MWF HTN/chronic HFpEF, OSA on CPAP, COPD, hypothyroidism, presented with worsening of shortness of breath    PT Comments  Patient is agreeable to PT session. She walked with and without rolling walker with improved gait pattern and safety using rolling walker. Patient is eager to go home soon. Recommend to continue PT to maximize independence and facilitate return to prior level of function.    If plan is discharge home, recommend the following: Help with stairs or ramp for entrance   Can travel by private vehicle        Equipment Recommendations  None recommended by PT    Recommendations for Other Services       Precautions / Restrictions Precautions Precautions: None Restrictions Weight Bearing Restrictions Per Provider Order: No     Mobility  Bed Mobility Overal bed mobility: Modified Independent                  Transfers Overall transfer level: Modified independent                      Ambulation/Gait Ambulation/Gait assistance: Contact guard assist, Supervision Gait Distance (Feet):  (70ft, 245ft) Assistive device: Rolling walker (2 wheels), None Gait Pattern/deviations: Step-through pattern, Decreased stride length Gait velocity: decreased     General Gait Details: patient walked a short distance with no device with mild unsteadiness, intermittently reaching for furniture for support. encouraged rolling walker if feeling off balance or unsteady. improved gait pattern and balance using 2 wheeled walker today   Stairs             Wheelchair Mobility     Tilt Bed    Modified Rankin (Stroke Patients Only)       Balance Overall balance assessment: Needs assistance Sitting-balance support: Feet supported Sitting  balance-Leahy Scale: Good Sitting balance - Comments: patient completed all seated toileting tasks without loss of balance   Standing balance support: No upper extremity supported Standing balance-Leahy Scale: Fair                              Hotel manager: No apparent difficulties  Cognition Arousal: Alert Behavior During Therapy: WFL for tasks assessed/performed   PT - Cognitive impairments: No apparent impairments                         Following commands: Intact      Cueing Cueing Techniques: Verbal cues  Exercises      General Comments General comments (skin integrity, edema, etc.): mild dizziness initially with standing activity that subsides      Pertinent Vitals/Pain Pain Assessment Pain Assessment: No/denies pain    Home Living                          Prior Function            PT Goals (current goals can now be found in the care plan section) Acute Rehab PT Goals Patient Stated Goal: get stronger, walk, go home PT Goal Formulation: With patient Time For Goal Achievement: 05/09/24 Potential to Achieve Goals: Good Progress towards PT goals: Progressing toward goals    Frequency  Min 2X/week      PT Plan      Co-evaluation              AM-PAC PT 6 Clicks Mobility   Outcome Measure  Help needed turning from your back to your side while in a flat bed without using bedrails?: None Help needed moving from lying on your back to sitting on the side of a flat bed without using bedrails?: None Help needed moving to and from a bed to a chair (including a wheelchair)?: None Help needed standing up from a chair using your arms (e.g., wheelchair or bedside chair)?: None Help needed to walk in hospital room?: A Little Help needed climbing 3-5 steps with a railing? : A Little 6 Click Score: 22    End of Session   Activity Tolerance: Patient tolerated treatment well Patient left: in  bed;with call bell/phone within reach (seated on edge of bed per patient preference)   PT Visit Diagnosis: Unsteadiness on feet (R26.81);Muscle weakness (generalized) (M62.81)     Time: 8669-8648 PT Time Calculation (min) (ACUTE ONLY): 21 min  Charges:    $Therapeutic Activity: 8-22 mins PT General Charges $$ ACUTE PT VISIT: 1 Visit                     Randine Essex, PT, MPT    Randine LULLA Essex 04/29/2024, 1:54 PM

## 2024-04-30 DIAGNOSIS — I5033 Acute on chronic diastolic (congestive) heart failure: Secondary | ICD-10-CM | POA: Diagnosis not present

## 2024-04-30 LAB — CBC
HCT: 26.1 % — ABNORMAL LOW (ref 36.0–46.0)
Hemoglobin: 8.5 g/dL — ABNORMAL LOW (ref 12.0–15.0)
MCH: 29.4 pg (ref 26.0–34.0)
MCHC: 32.6 g/dL (ref 30.0–36.0)
MCV: 90.3 fL (ref 80.0–100.0)
Platelets: 159 K/uL (ref 150–400)
RBC: 2.89 MIL/uL — ABNORMAL LOW (ref 3.87–5.11)
RDW: 14.6 % (ref 11.5–15.5)
WBC: 5.2 K/uL (ref 4.0–10.5)
nRBC: 0 % (ref 0.0–0.2)

## 2024-04-30 LAB — RENAL FUNCTION PANEL
Albumin: 3.4 g/dL — ABNORMAL LOW (ref 3.5–5.0)
Anion gap: 10 (ref 5–15)
BUN: 56 mg/dL — ABNORMAL HIGH (ref 8–23)
CO2: 28 mmol/L (ref 22–32)
Calcium: 9.2 mg/dL (ref 8.9–10.3)
Chloride: 96 mmol/L — ABNORMAL LOW (ref 98–111)
Creatinine, Ser: 3.53 mg/dL — ABNORMAL HIGH (ref 0.44–1.00)
GFR, Estimated: 12 mL/min — ABNORMAL LOW (ref >60)
Glucose, Bld: 92 mg/dL (ref 70–99)
Phosphorus: 4.5 mg/dL (ref 2.5–4.6)
Potassium: 4.2 mmol/L (ref 3.5–5.1)
Sodium: 134 mmol/L — ABNORMAL LOW (ref 135–145)

## 2024-04-30 MED ORDER — EPOETIN ALFA-EPBX 4000 UNIT/ML IJ SOLN
INTRAMUSCULAR | Status: AC
  Filled 2024-04-30: qty 1

## 2024-04-30 MED ORDER — HEPARIN SODIUM (PORCINE) 1000 UNIT/ML IJ SOLN
INTRAMUSCULAR | Status: AC
  Filled 2024-04-30: qty 10

## 2024-04-30 NOTE — Progress Notes (Signed)
 PT Cancellation Note  Patient Details Name: [name] MRN: [id number] DOB: 04-25-1941   Cancelled Treatment:    Reason Eval/Treat Not Completed: Patient at procedure or test/unavailable Patient in HD this morning. Will re-attempt at later time/date when patient available.   Kyliee Ortego 04/30/2024, 8:36 AM

## 2024-04-30 NOTE — Progress Notes (Signed)
 [facility]  ROUNDING NOTE   Subjective:   [name] is a 83 y.o. female with past medical conditions including diastolic heart failure, hypertension, hypothyroidism, COPD, and end stage renal disease on hemodialysis. Patient presents to the emergency department complaining of shortness of breath and has been admitted for CHF (congestive heart failure) (HCC) [I50.9] SOB (shortness of breath) [R06.02] Hypoxic [R09.02] Acute respiratory failure with hypoxia (HCC) [J96.01] ESRD on hemodialysis (HCC) [N18.6, Z99.2]  Patient is known to our practice and receives outpatient dialysis treatments at [facility], on a MWF schedule, supervised by Dr Marcelino.   Update:  Patient seen and evaluated during dialysis   HEMODIALYSIS FLOWSHEET:  Blood Flow Rate (mL/min): 400 mL/min Arterial Pressure (mmHg): -181.81 mmHg Venous Pressure (mmHg): 169.69 mmHg TMP (mmHg): 4.04 mmHg Ultrafiltration Rate (mL/min): 0 mL/min Dialysate Flow Rate (mL/min): 299 ml/min Bolus Amount (mL): 200 mL  Tolerating treatment well No complaints to offer  Objective:  Vital signs in last 24 hours:  Temp:  [97.6 F (36.4 C)-98.5 F (36.9 C)] 97.6 F (36.4 C) (07/30 0809) Pulse Rate:  [50-70] 50 (07/30 0930) Resp:  [16-22] 21 (07/30 0930) BP: (118-161)/(29-67) 118/29 (07/30 0930) SpO2:  [94 %-100 %] 97 % (07/30 0900) FiO2 (%):  [28 %] 28 % (07/29 2215) Weight:  [71 kg-73.3 kg] 71 kg (07/30 0809)  Weight change: -1.444 kg Filed Weights   04/29/24 0501 04/30/24 0500 04/30/24 0809  Weight: 72.9 kg 73.3 kg 71 kg    Intake/Output: I/O last 3 completed shifts: In: 480 [P.O.:480] Out: -    Intake/Output this shift:  No intake/output data recorded.  Physical Exam: General: No acute distress  Head: Normocephalic, atraumatic. Moist oral mucosal membranes  Eyes: Anicteric  Neck: Supple  Lungs:  Clear to auscultation, NCO 2  Heart: Regular rate and rhythm  Abdomen:  Soft, nontender   Extremities: No peripheral edema.  Neurologic: Awake, alert, conversant  Skin: Warm,dry, no rash  Access: Left IJ PermCath, left upper aVF    Basic Metabolic Panel: Recent Labs  Lab 04/24/24 0633 04/25/24 9347 04/25/24 0850 04/26/24 0223 04/27/24 0426 04/28/24 0352 04/30/24 0322  NA 137   < > 140 134* 134* 139 134*  K 3.5   < > 3.4* 3.4* 3.9 4.3 4.2  CL 95*   < > 97* 95* 96* 100 96*  CO2 27   < > 29 28 26 25 28   GLUCOSE 171*   < > 93 99 94 91 92  BUN 17   < > 26* 29* 58* 71* 56*  CREATININE 2.18*   < > 3.16* 2.97* 4.31* 4.57* 3.53*  CALCIUM  9.0   < > 9.2 9.2 9.1 9.1 9.2  MG 1.8  --   --  1.9  --   --   --   PHOS 3.0  --  4.1  --   --  4.4 4.5   < > = values in this interval not displayed.    Liver Function Tests: Recent Labs  Lab 04/24/24 [phone] 04/25/24 0850 04/28/24 0352 04/30/24 0322  AST 34  --   --   --   ALT 19  --   --   --   ALKPHOS 93  --   --   --   BILITOT 0.9  --   --   --   PROT 6.9  --   --   --   ALBUMIN 3.8 3.5 3.3* 3.4*   No results for input(s): LIPASE, AMYLASE  in the last 168 hours. No results for input(s): AMMONIA in the last 168 hours.  CBC: Recent Labs  Lab 04/24/24 0633 04/25/24 0850 04/26/24 0223 04/30/24 0844  WBC 11.8* 6.3 7.6 5.2  NEUTROABS 4.5  --   --   --   HGB 9.2* 8.5* 8.6* 8.5*  HCT 28.8* 26.4* 27.0* 26.1*  MCV 94.1 92.3 91.8 90.3  PLT 207 166 166 159    Cardiac Enzymes: No results for input(s): CKTOTAL, CKMB, CKMBINDEX, TROPONINI in the last 168 hours.  BNP: Invalid input(s): POCBNP  CBG: No results for input(s): GLUCAP in the last 168 hours.  Microbiology: Results for orders placed or performed during the hospital encounter of 03/09/24  Resp panel by RT-PCR (RSV, Flu A&B, Covid) Anterior Nasal Swab     Status: None   Collection Time: 03/09/24  7:18 PM   Specimen: Anterior Nasal Swab  Result Value Ref Range Status   SARS Coronavirus 2 by RT PCR NEGATIVE NEGATIVE Final    Comment:  (NOTE) SARS-CoV-2 target nucleic acids are NOT DETECTED.  The SARS-CoV-2 RNA is generally detectable in upper respiratory specimens during the acute phase of infection. The lowest concentration of SARS-CoV-2 viral copies this assay can detect is 138 copies/mL. A negative result does not preclude SARS-Cov-2 infection and should not be used as the sole basis for treatment or other patient management decisions. A negative result may occur with  improper specimen collection/handling, submission of specimen other than nasopharyngeal swab, presence of viral mutation(s) within the areas targeted by this assay, and inadequate number of viral copies(<138 copies/mL). A negative result must be combined with clinical observations, patient history, and epidemiological information. The expected result is Negative.  Fact Sheet for Patients:  BloggerCourse.com  Fact Sheet for Healthcare Providers:  SeriousBroker.it  This test is no t yet approved or cleared by the [place]  FDA and  has been authorized for detection and/or diagnosis of SARS-CoV-2 by FDA under an Emergency Use Authorization (EUA). This EUA will remain  in effect (meaning this test can be used) for the duration of the COVID-19 declaration under Section 564(b)(1) of the Act, 21 U.S.C.section 360bbb-3(b)(1), unless the authorization is terminated  or revoked sooner.       Influenza A by PCR NEGATIVE NEGATIVE Final   Influenza B by PCR NEGATIVE NEGATIVE Final    Comment: (NOTE) The Xpert Xpress SARS-CoV-2/FLU/RSV plus assay is intended as an aid in the diagnosis of influenza from Nasopharyngeal swab specimens and should not be used as a sole basis for treatment. Nasal washings and aspirates are unacceptable for Xpert Xpress SARS-CoV-2/FLU/RSV testing.  Fact Sheet for Patients: BloggerCourse.com  Fact Sheet for Healthcare  Providers: SeriousBroker.it  This test is not yet approved or cleared by the [place]  FDA and has been authorized for detection and/or diagnosis of SARS-CoV-2 by FDA under an Emergency Use Authorization (EUA). This EUA will remain in effect (meaning this test can be used) for the duration of the COVID-19 declaration under Section 564(b)(1) of the Act, 21 U.S.C. section 360bbb-3(b)(1), unless the authorization is terminated or revoked.     Resp Syncytial Virus by PCR NEGATIVE NEGATIVE Final    Comment: (NOTE) Fact Sheet for Patients: BloggerCourse.com  Fact Sheet for Healthcare Providers: SeriousBroker.it  This test is not yet approved or cleared by the [place]  FDA and has been authorized for detection and/or diagnosis of SARS-CoV-2 by FDA under an Emergency Use Authorization (EUA). This EUA will remain in effect (meaning this test can  be used) for the duration of the COVID-19 declaration under Section 564(b)(1) of the Act, 21 U.S.C. section 360bbb-3(b)(1), unless the authorization is terminated or revoked.  Performed at [facility], [address] [place], [place] [address]   Respiratory (~20 pathogens) panel by PCR     Status: None   Collection Time: 03/09/24 10:00 PM   Specimen: Nasopharyngeal Swab; Respiratory  Result Value Ref Range Status   Adenovirus NOT DETECTED NOT DETECTED Final   Coronavirus 229E NOT DETECTED NOT DETECTED Final    Comment: (NOTE) The Coronavirus on the Respiratory Panel, DOES NOT test for the novel  Coronavirus (2019 nCoV)    Coronavirus HKU1 NOT DETECTED NOT DETECTED Final   Coronavirus NL63 NOT DETECTED NOT DETECTED Final   Coronavirus OC43 NOT DETECTED NOT DETECTED Final   Metapneumovirus NOT DETECTED NOT DETECTED Final   Rhinovirus / Enterovirus NOT DETECTED NOT DETECTED Final   Influenza A NOT DETECTED NOT DETECTED Final   Influenza B NOT  DETECTED NOT DETECTED Final   Parainfluenza Virus 1 NOT DETECTED NOT DETECTED Final   Parainfluenza Virus 2 NOT DETECTED NOT DETECTED Final   Parainfluenza Virus 3 NOT DETECTED NOT DETECTED Final   Parainfluenza Virus 4 NOT DETECTED NOT DETECTED Final   Respiratory Syncytial Virus NOT DETECTED NOT DETECTED Final   Bordetella pertussis NOT DETECTED NOT DETECTED Final   Bordetella Parapertussis NOT DETECTED NOT DETECTED Final   Chlamydophila pneumoniae NOT DETECTED NOT DETECTED Final   Mycoplasma pneumoniae NOT DETECTED NOT DETECTED Final    Comment: Performed at [facility], [address], [place], [place] [address]    Coagulation Studies: No results for input(s): LABPROT, INR in the last 72 hours.  Urinalysis: No results for input(s): COLORURINE, LABSPEC, PHURINE, GLUCOSEU, HGBUR, BILIRUBINUR, KETONESUR, PROTEINUR, UROBILINOGEN, NITRITE, LEUKOCYTESUR in the last 72 hours.  Invalid input(s): APPERANCEUR    Imaging: No results found.    Medications:      allopurinol   100 mg Oral Daily   amLODipine   5 mg Oral Daily   apixaban   2.5 mg Oral BID   azelastine   2 spray Each Nare BID   Chlorhexidine  Gluconate Cloth  6 each Topical Q0600   epoetin  alfa-epbx (RETACRIT ) injection  4,000 Units Intravenous Q M,W,F-1800   fluticasone   2 spray Each Nare Daily   fluticasone  furoate-vilanterol  1 puff Inhalation Daily   hydrALAZINE   25 mg Oral TID   levothyroxine   150 mcg Oral q morning   metoprolol  tartrate  25 mg Oral BID   montelukast   10 mg Oral QHS   pantoprazole   40 mg Oral Daily   rosuvastatin   40 mg Oral Daily   sertraline   100 mg Oral Daily   sodium chloride  flush  3 mL Intravenous Q12H   torsemide   40 mg Oral Daily   acetaminophen , albuterol , alum & mag hydroxide-simeth, hydrALAZINE , methocarbamol , ondansetron  (ZOFRAN ) IV, mouth rinse, sodium chloride  flush  Assessment/ Plan:  [name] is a 83 y.o.  female with past medical  conditions including diastolic heart failure, hypertension, hypothyroidism, COPD, and end stage renal disease on hemodialysis. Patient presents to the emergency department complaining of shortness of breath and has been admitted for CHF (congestive heart failure) (HCC) [I50.9] SOB (shortness of breath) [R06.02] Hypoxic [R09.02] Acute respiratory failure with hypoxia (HCC) [J96.01] ESRD on hemodialysis (HCC) [N18.6, Z99.2]   Acute respiratory failure requiring BiPAP at admission.  Weaned to room air.   2.  End-stage renal disease on hemodialysis.  Receiving dialysis today, UF 1L as tolerated. Next treatment scheduled for Friday.  3. Anemia of chronic kidney disease Lab Results  Component Value Date   HGB 8.5 (L) 04/30/2024  Continue low-dose EPO with dialysis. Resume Mircera as an outpatient.  4.  Hypertension with chronic kidney disease.  Home regimen includes amlodipine , furosemide , hydralazine , metoprolol , and torsemide .  Continue amlodipine , hydralazine , metoprolol , and torsemide . Blood pressure 123/28 during dialysis.     LOS: 5 Quay Simkin 7/30/20259:45 AM

## 2024-04-30 NOTE — Plan of Care (Signed)
   Problem: Education: Goal: Knowledge of General Education information will improve Description Including pain rating scale, medication(s)/side effects and non-pharmacologic comfort measures Outcome: Progressing

## 2024-04-30 NOTE — Progress Notes (Signed)
 Hemodialysis Note:  Received patient in bed to unit. Alert and oriented. Informed consent singed and in chart.  Treatment initiated: 0816 Treatment completed: 1134  Access used: left internal jugular catheter Access issues: None  Patient tolerated well. Transported back to room, alert without acute distress. Report given to patient's RN.  Total UF removed: 900 ml Medications given: Retacrit  4000 units IV  Post HD weight: 70.1 kG  [name]

## 2024-04-30 NOTE — Plan of Care (Signed)

## 2024-04-30 NOTE — Progress Notes (Signed)
 Progress Note    [name]  FMW:[id number] DOB: 14-Aug-1941  DOA: 04/24/2024 PCP: Ostwalt, Janna, PA-C      Brief Narrative:    Medical records reviewed and are as summarized below:  [name] is a 83 y.o. 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 She was admitted to the hospital for acute exacerbation of chronic diastolic CHF and fluid overload from ESRD.        Assessment/Plan:   Principal Problem:   CHF (congestive heart failure) (HCC) Active Problems:   Acute dyspnea   Acute on chronic diastolic congestive heart failure (HCC)   Body mass index is 28.26 kg/m.    Acute on chronic diastolic CHF: Improved.  Continue torsemide .  Fluid management with hemodialysis   ESRD: Follow-up with nephrologist for hemodialysis   Hypotension, history of hypertension: BP is better.  Hydralazine  has been slowly decreased from 100 mg to 25 mg 3 times daily. Continue amlodipine , metoprolol  and torsemide    COPD: Compensated.  Continue bronchodilators.   Debility: PT recommended home health therapy.   Comorbidities include anemia of chronic disease, hypothyroidism, gout   Discussed discharge plans.  She is not comfortable going home today because of how she feels.  She said she feels poorly, weak and tired after hemodialysis.  She prefers to stay overnight and go home tomorrow.  Diet Order             Diet renal with fluid restriction Fluid restriction: 1200 mL Fluid; Room service appropriate? Yes; Fluid consistency: Thin  Diet effective now                            Consultants: Nephrologist  Procedures: None    Medications:    allopurinol   100 mg Oral Daily   amLODipine   5 mg Oral Daily   apixaban   2.5 mg Oral BID   azelastine   2 spray Each Nare BID   Chlorhexidine  Gluconate Cloth  6 each Topical  Q0600   epoetin  alfa-epbx (RETACRIT ) injection  4,000 Units Intravenous Q M,W,F-1800   fluticasone   2 spray Each Nare Daily   fluticasone  furoate-vilanterol  1 puff Inhalation Daily   hydrALAZINE   25 mg Oral TID   levothyroxine   150 mcg Oral q morning   metoprolol  tartrate  25 mg Oral BID   montelukast   10 mg Oral QHS   pantoprazole   40 mg Oral Daily   rosuvastatin   40 mg Oral Daily   sertraline   100 mg Oral Daily   sodium chloride  flush  3 mL Intravenous Q12H   torsemide   40 mg Oral Daily   Continuous Infusions:   Anti-infectives (From admission, onward)    None              Family Communication/Anticipated D/C date and plan/Code Status   DVT prophylaxis: apixaban  (ELIQUIS ) tablet 2.5 mg Start: 04/24/24 2200 apixaban  (ELIQUIS ) tablet 2.5 mg     Code Status: Limited: Do not attempt resuscitation (DNR) -DNR-LIMITED -Do Not Intubate/DNI   Family Communication: None Disposition Plan: Plan to discharge home   Status is: Inpatient Remains inpatient appropriate because: CHF exacerbation/fluid load       Subjective:   Interval events noted.  He had hemodialysis this morning.  She says she is feeling weak and tired.  She does not want to  go home today because of how she feels.  She made reference to the fact that she came today ED with shortness of breath just st the day before she had hemodialysis.   Objective:    Vitals:   04/30/24 1130 04/30/24 1134 04/30/24 1136 04/30/24 1216  BP: (!) 135/43 (!) 136/44 (!) 136/42 (!) 137/50  Pulse: (!) 51 (!) 51 (!) 52 (!) 54  Resp: 17 19 (!) 22 20  Temp:   97.8 F (36.6 C) 97.8 F (36.6 C)  TempSrc:    Oral  SpO2: 99%  97% 97%  Weight:   70.1 kg   Height:       No data found.   Intake/Output Summary (Last 24 hours) at 04/30/2024 1303 Last data filed at 04/30/2024 1136 Gross per 24 hour  Intake --  Output 900 ml  Net -900 ml   Filed Weights   04/30/24 0500 04/30/24 0809 04/30/24 1136  Weight: 73.3 kg 71 kg  70.1 kg    Exam:  GEN: NAD SKIN: Warm and dry EYES: No pallor or icterus ENT: MMM CV: RRR PULM: CTA B ABD: soft, ND, NT, +BS CNS: AAO x 3, non focal EXT: No edema or tenderness        Data Reviewed:   I have personally reviewed following labs and imaging studies:  Labs: Labs show the following:   Basic Metabolic Panel: Recent Labs  Lab 04/24/24 0633 04/25/24 0652 04/25/24 0850 04/26/24 0223 04/27/24 0426 04/28/24 0352 04/30/24 0322  NA 137   < > 140 134* 134* 139 134*  K 3.5   < > 3.4* 3.4* 3.9 4.3 4.2  CL 95*   < > 97* 95* 96* 100 96*  CO2 27   < > 29 28 26 25 28   GLUCOSE 171*   < > 93 99 94 91 92  BUN 17   < > 26* 29* 58* 71* 56*  CREATININE 2.18*   < > 3.16* 2.97* 4.31* 4.57* 3.53*  CALCIUM  9.0   < > 9.2 9.2 9.1 9.1 9.2  MG 1.8  --   --  1.9  --   --   --   PHOS 3.0  --  4.1  --   --  4.4 4.5   < > = values in this interval not displayed.   GFR Estimated Creatinine Clearance: 11.1 mL/min (A) (by C-G formula based on SCr of 3.53 mg/dL (H)). Liver Function Tests: Recent Labs  Lab 04/24/24 [phone] 04/25/24 0850 04/28/24 0352 04/30/24 0322  AST 34  --   --   --   ALT 19  --   --   --   ALKPHOS 93  --   --   --   BILITOT 0.9  --   --   --   PROT 6.9  --   --   --   ALBUMIN 3.8 3.5 3.3* 3.4*   No results for input(s): LIPASE, AMYLASE in the last 168 hours. No results for input(s): AMMONIA in the last 168 hours. Coagulation profile No results for input(s): INR, PROTIME in the last 168 hours.  CBC: Recent Labs  Lab 04/24/24 0633 04/25/24 0850 04/26/24 0223 04/30/24 0844  WBC 11.8* 6.3 7.6 5.2  NEUTROABS 4.5  --   --   --   HGB 9.2* 8.5* 8.6* 8.5*  HCT 28.8* 26.4* 27.0* 26.1*  MCV 94.1 92.3 91.8 90.3  PLT 207 166 166 159   Cardiac Enzymes: No results for input(s): CKTOTAL,  CKMB, CKMBINDEX, TROPONINI in the last 168 hours. BNP (last 3 results) No results for input(s): PROBNP in the last 8760 hours. CBG: No results for  input(s): GLUCAP in the last 168 hours. D-Dimer: No results for input(s): DDIMER in the last 72 hours. Hgb A1c: No results for input(s): HGBA1C in the last 72 hours. Lipid Profile: No results for input(s): CHOL, HDL, LDLCALC, TRIG, CHOLHDL, LDLDIRECT in the last 72 hours. Thyroid  function studies: No results for input(s): TSH, T4TOTAL, T3FREE, THYROIDAB in the last 72 hours.  Invalid input(s): FREET3 Anemia work up: No results for input(s): VITAMINB12, FOLATE, FERRITIN, TIBC, IRON, RETICCTPCT in the last 72 hours. Sepsis Labs: Recent Labs  Lab 04/24/24 9366 04/25/24 0850 04/26/24 0223 04/30/24 0844  WBC 11.8* 6.3 7.6 5.2    Microbiology No results found for this or any previous visit (from the past 240 hours).  Procedures and diagnostic studies:  No results found.             LOS: 5 days   [name]  [facility]   Pager on [email or web address]. If 7PM-7AM, please contact night-coverage at [email or web address]     04/30/2024, 1:03 PM

## 2024-05-01 ENCOUNTER — Encounter (INDEPENDENT_AMBULATORY_CARE_PROVIDER_SITE_OTHER)

## 2024-05-01 ENCOUNTER — Ambulatory Visit (INDEPENDENT_AMBULATORY_CARE_PROVIDER_SITE_OTHER): Admitting: Nurse Practitioner

## 2024-05-01 DIAGNOSIS — I5033 Acute on chronic diastolic (congestive) heart failure: Secondary | ICD-10-CM | POA: Diagnosis not present

## 2024-05-01 MED ORDER — HYDRALAZINE HCL 25 MG PO TABS: 25.0000 mg | ORAL_TABLET | Freq: Three times a day (TID) | ORAL | 0 refills | Status: DC

## 2024-05-01 MED ORDER — METOPROLOL TARTRATE 50 MG PO TABS: 25.0000 mg | ORAL_TABLET | Freq: Two times a day (BID) | ORAL | Status: DC

## 2024-05-01 NOTE — TOC Transition Note (Signed)
 Transition of Care [facility]) - Discharge Note   Patient Details  Name: [name] MRN: [id number] Date of Birth: 14-Jun-1941  [facility] CM/SW Contact:  Bartt Gonzaga C Miro Balderson, RN Phone Number: 05/01/2024, 1:47 PM   Clinical Narrative:    Patient discharged.   Contacted Cheryl from [place] to notify of discharge.   TOC signing off.           Patient Goals and CMS Choice            Discharge Placement                       Discharge Plan and Services Additional resources added to the After Visit Summary for                                       Social Drivers of Health (SDOH) Interventions SDOH Screenings   Food Insecurity: No Food Insecurity (04/25/2024)  Housing: High Risk (04/25/2024)  Transportation Needs: Unmet Transportation Needs (04/25/2024)  Utilities: Not At Risk (04/25/2024)  Depression (PHQ2-9): Low Risk  (03/27/2024)  Recent Concern: Depression (PHQ2-9) - Medium Risk (02/15/2024)  Financial Resource Strain: Medium Risk (04/14/2024)   Received from [facility]  Physical Activity: Insufficiently Active (06/12/2023)  Social Connections: Moderately Isolated (04/25/2024)  Stress: No Stress Concern Present (06/12/2023)  Tobacco Use: Low Risk  (04/24/2024)     Readmission Risk Interventions    07/30/2023   10:37 AM 04/19/2023    1:10 PM 03/27/2023   12:56 PM  Readmission Risk Prevention Plan  Transportation Screening Complete Complete Complete  Medication Review Oceanographer) Complete Complete Complete  PCP or Specialist appointment within 3-5 days of discharge Complete  Complete  HRI or Home Care Consult Complete Complete Complete  SW Recovery Care/Counseling Consult Complete Complete Complete  Palliative Care Screening Not Applicable Not Applicable Not Applicable  Skilled Nursing Facility Not Applicable Not Applicable Not Applicable

## 2024-05-01 NOTE — Plan of Care (Signed)
   Problem: Education: Goal: Knowledge of General Education information will improve Description Including pain rating scale, medication(s)/side effects and non-pharmacologic comfort measures Outcome: Progressing

## 2024-05-01 NOTE — Discharge Summary (Signed)
 Physician Discharge Summary   Patient: [name] MRN: [id number] DOB: Nov 04, 1940  Admit date:     04/24/2024  Discharge date: 05/01/24  Discharge Physician: AIDA CHO   PCP: Ostwalt, Janna, PA-C   Recommendations at discharge:   Follow-up with PCP in 1 week Continue outpatient hemodialysis as scheduled on Mondays, Wednesdays and Fridays.  Discharge Diagnoses: Principal Problem:   CHF (congestive heart failure) (HCC) Active Problems:   Acute dyspnea   Acute on chronic diastolic congestive heart failure (HCC)  Resolved Problems:   * No resolved hospital problems. [facility] Course:  [name] is a 83 y.o. 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 She was admitted to the hospital for acute exacerbation of chronic diastolic CHF and fluid overload from ESRD.   Assessment and Plan:   Acute on chronic diastolic CHF: Improved.  Continue torsemide .  Fluid management with hemodialysis 2D echo in June 2025 showed EF estimated at 55 to 60%, severely dilated left ventricle, mild LVH, grade 3 diastolic dysfunction, moderately elevated pulmonary artery systolic pressure, severely dilated left atrium, severe mitral valve regurgitation, mild to moderate tricuspid regurgitation    ESRD: Plan for hemodialysis tomorrow with outpatient hemodialysis center.     Hypotension, history of hypertension: BP is better.  Hydralazine  has been slowly decreased from 100 mg to 25 mg 3 times daily. Continue amlodipine , metoprolol  and torsemide . Close outpatient follow-up with PCP and nephrologist recommended for ongoing management.    COPD: Compensated.  Continue bronchodilators.     Debility: PT recommended home health therapy.     Comorbidities include valvular heart disease, anemia of chronic disease, hypothyroidism, gout, OSA on CPAP at night      She is feeling better and she wants to be discharged home this morning.  She is deemed stable for discharge.        Consultants: Nephrologist Procedures performed: None Disposition: Home Diet recommendation:  Discharge Diet Orders (From admission, onward)     Start     Ordered   05/01/24 0000  Diet renal with fluid restriction        05/01/24 1025           Renal diet DISCHARGE MEDICATION: Allergies as of 05/01/2024       Reactions   Ace Inhibitors    Other reaction(s): Unknown   Egg-derived Products Diarrhea   Other    Other reaction(s): Other (See Comments) Eggs   Prednisone     Other reaction(s): Other (See Comments) joint pain   Risedronate    Other reaction(s): Other (See Comments)   Sulfa Antibiotics Itching, Swelling   Other reaction(s): Other (See Comments)   Sulfasalazine Other (See Comments)        Medication List     TAKE these medications    acetaminophen  500 MG tablet Commonly known as: TYLENOL  Take 1,000 mg by mouth every 6 (six) hours as needed for mild pain or moderate pain.   albuterol  108 (90 Base) MCG/ACT inhaler Commonly known as: VENTOLIN  HFA Inhale 2 puffs into the lungs every 6 (six) hours as needed for wheezing or shortness of breath.   allopurinol  100 MG tablet Commonly known as: ZYLOPRIM  Take 100 mg by mouth daily.   amLODipine  5 MG tablet Commonly known as: NORVASC  Take 1 tablet (5 mg total) by mouth daily.   apixaban  2.5 MG Tabs tablet Commonly known as:  ELIQUIS  Take 1 tablet (2.5 mg total) by mouth 2 (two) times daily.   azelastine  0.1 % nasal spray Commonly known as: ASTELIN  Place 2 sprays into both nostrils 2 (two) times daily. Use in each nostril as directed   CALCIUM  & VIT D3 BONE HEALTH PO Take 1 tablet by mouth daily. 600 mg/ 25 mg   epoetin  alfa-epbx 4000 UNIT/ML injection Commonly known as: RETACRIT  Inject 1 mL (4,000 Units total) into the vein every Monday, Wednesday, and Friday at 6 PM.    esomeprazole  40 MG capsule Commonly known as: NEXIUM  Take 1 capsule (40 mg total) by mouth daily before breakfast.   ferrous sulfate 325 (65 FE) MG tablet Take 325 mg by mouth daily with breakfast.   fluticasone  50 MCG/ACT nasal spray Commonly known as: FLONASE  Place 2 sprays into both nostrils daily.   fluticasone  furoate-vilanterol 200-25 MCG/ACT Aepb Commonly known as: Breo Ellipta  Inhale 1 puff into the lungs daily.   folic acid  1 MG tablet Commonly known as: FOLVITE  Take 1 tablet (1 mg total) by mouth daily.   hydrALAZINE  25 MG tablet Commonly known as: APRESOLINE  Take 1 tablet (25 mg total) by mouth 3 (three) times daily. What changed:  medication strength how much to take   ipratropium-albuterol  0.5-2.5 (3) MG/3ML Soln Commonly known as: DUONEB INHALE THE CONTENTS OF ONE (1) VIAL VIA NEBULIZATION EVERY 6 HOURS AS NEEDED FORSHORTNESS OF BREATH   levothyroxine  150 MCG tablet Commonly known as: SYNTHROID  Take 150 mcg by mouth every morning.   lidocaine -prilocaine  cream Commonly known as: EMLA  as directed. Before dialysis   loperamide  2 MG capsule Commonly known as: IMODIUM  Take 1 capsule (2 mg total) by mouth as needed for diarrhea or loose stools.   metoprolol  tartrate 50 MG tablet Commonly known as: LOPRESSOR  Take 0.5 tablets (25 mg total) by mouth 2 (two) times daily.   montelukast  10 MG tablet Commonly known as: SINGULAIR  Take 1 tablet (10 mg total) by mouth at bedtime.   nystatin powder Apply 1 Application topically 3 (three) times daily.   rosuvastatin  40 MG tablet Commonly known as: CRESTOR  Take 40 mg by mouth daily.   senna-docusate 8.6-50 MG tablet Commonly known as: Senokot-S Take 2 tablets by mouth at bedtime as needed for mild constipation.   sertraline  100 MG tablet Commonly known as: ZOLOFT  TAKE 1 TABLET BY MOUTH DAILY   torsemide  20 MG tablet Commonly known as: DEMADEX  Take 40 mg by mouth daily.   vitamin B-12 500 MCG  tablet Commonly known as: CYANOCOBALAMIN  Take 500 mcg by mouth daily.        Discharge Exam: Filed Weights   04/30/24 0809 04/30/24 1136 05/01/24 0500  Weight: 71 kg 70.1 kg 74.6 kg   GEN: NAD SKIN: Warm and dry EYES: No pallor or icterus ENT: MMM CV: RRR PULM: CTA B ABD: soft, ND, NT, +BS CNS: AAO x 3, non focal EXT: No edema or tenderness   Condition at discharge: good  The results of significant diagnostics from this hospitalization (including imaging, microbiology, ancillary and laboratory) are listed below for reference.   Imaging Studies: DG Chest Portable 1 View Result Date: 04/24/2024 CLINICAL DATA:  Hypoxia and shortness of breath. EXAM: PORTABLE CHEST 1 VIEW COMPARISON:  Portable chest 03/09/2024 FINDINGS: 6:51 a.m. Left IJ dialysis catheter with distal split lumens. The most distal lumen tip is just above the superior cavoatrial junction, as before. Moderate cardiomegaly. There is perihilar vascular congestion with mild generalized interstitial edema and small pleural effusions. There  is linear atelectasis in both bases, left mid field. No focal airspace infiltrate is seen. Mediastinum is normally outlined. There is aortic atherosclerosis. No new osseous findings. There were similar findings on the prior study. Today the basilar atelectasis is less prominent than previously but no other change is seen in overall aeration. IMPRESSION: 1. Cardiomegaly with perihilar vascular congestion, mild generalized interstitial edema and small pleural effusions. 2. Bibasilar atelectasis, less prominent than previously. 3. Left IJ dialysis catheter with distal split lumens. 4. Aortic atherosclerosis. Electronically Signed   By: Francis Quam M.D.   On: 04/24/2024 07:28    Microbiology: Results for orders placed or performed during the hospital encounter of 03/09/24  Resp panel by RT-PCR (RSV, Flu A&B, Covid) Anterior Nasal Swab     Status: None   Collection Time: 03/09/24  7:18 PM    Specimen: Anterior Nasal Swab  Result Value Ref Range Status   SARS Coronavirus 2 by RT PCR NEGATIVE NEGATIVE Final    Comment: (NOTE) SARS-CoV-2 target nucleic acids are NOT DETECTED.  The SARS-CoV-2 RNA is generally detectable in upper respiratory specimens during the acute phase of infection. The lowest concentration of SARS-CoV-2 viral copies this assay can detect is 138 copies/mL. A negative result does not preclude SARS-Cov-2 infection and should not be used as the sole basis for treatment or other patient management decisions. A negative result may occur with  improper specimen collection/handling, submission of specimen other than nasopharyngeal swab, presence of viral mutation(s) within the areas targeted by this assay, and inadequate number of viral copies(<138 copies/mL). A negative result must be combined with clinical observations, patient history, and epidemiological information. The expected result is Negative.  Fact Sheet for Patients:  BloggerCourse.com  Fact Sheet for Healthcare Providers:  SeriousBroker.it  This test is no t yet approved or cleared by the [place]  FDA and  has been authorized for detection and/or diagnosis of SARS-CoV-2 by FDA under an Emergency Use Authorization (EUA). This EUA will remain  in effect (meaning this test can be used) for the duration of the COVID-19 declaration under Section 564(b)(1) of the Act, 21 U.S.C.section 360bbb-3(b)(1), unless the authorization is terminated  or revoked sooner.       Influenza A by PCR NEGATIVE NEGATIVE Final   Influenza B by PCR NEGATIVE NEGATIVE Final    Comment: (NOTE) The Xpert Xpress SARS-CoV-2/FLU/RSV plus assay is intended as an aid in the diagnosis of influenza from Nasopharyngeal swab specimens and should not be used as a sole basis for treatment. Nasal washings and aspirates are unacceptable for Xpert Xpress  SARS-CoV-2/FLU/RSV testing.  Fact Sheet for Patients: BloggerCourse.com  Fact Sheet for Healthcare Providers: SeriousBroker.it  This test is not yet approved or cleared by the [place]  FDA and has been authorized for detection and/or diagnosis of SARS-CoV-2 by FDA under an Emergency Use Authorization (EUA). This EUA will remain in effect (meaning this test can be used) for the duration of the COVID-19 declaration under Section 564(b)(1) of the Act, 21 U.S.C. section 360bbb-3(b)(1), unless the authorization is terminated or revoked.     Resp Syncytial Virus by PCR NEGATIVE NEGATIVE Final    Comment: (NOTE) Fact Sheet for Patients: BloggerCourse.com  Fact Sheet for Healthcare Providers: SeriousBroker.it  This test is not yet approved or cleared by the [place]  FDA and has been authorized for detection and/or diagnosis of SARS-CoV-2 by FDA under an Emergency Use Authorization (EUA). This EUA will remain in effect (meaning this test can be used) for  the duration of the COVID-19 declaration under Section 564(b)(1) of the Act, 21 U.S.C. section 360bbb-3(b)(1), unless the authorization is terminated or revoked.  Performed at [facility], [address] [place], [place] [address]   Respiratory (~20 pathogens) panel by PCR     Status: None   Collection Time: 03/09/24 10:00 PM   Specimen: Nasopharyngeal Swab; Respiratory  Result Value Ref Range Status   Adenovirus NOT DETECTED NOT DETECTED Final   Coronavirus 229E NOT DETECTED NOT DETECTED Final    Comment: (NOTE) The Coronavirus on the Respiratory Panel, DOES NOT test for the novel  Coronavirus (2019 nCoV)    Coronavirus HKU1 NOT DETECTED NOT DETECTED Final   Coronavirus NL63 NOT DETECTED NOT DETECTED Final   Coronavirus OC43 NOT DETECTED NOT DETECTED Final   Metapneumovirus NOT DETECTED NOT DETECTED Final    Rhinovirus / Enterovirus NOT DETECTED NOT DETECTED Final   Influenza A NOT DETECTED NOT DETECTED Final   Influenza B NOT DETECTED NOT DETECTED Final   Parainfluenza Virus 1 NOT DETECTED NOT DETECTED Final   Parainfluenza Virus 2 NOT DETECTED NOT DETECTED Final   Parainfluenza Virus 3 NOT DETECTED NOT DETECTED Final   Parainfluenza Virus 4 NOT DETECTED NOT DETECTED Final   Respiratory Syncytial Virus NOT DETECTED NOT DETECTED Final   Bordetella pertussis NOT DETECTED NOT DETECTED Final   Bordetella Parapertussis NOT DETECTED NOT DETECTED Final   Chlamydophila pneumoniae NOT DETECTED NOT DETECTED Final   Mycoplasma pneumoniae NOT DETECTED NOT DETECTED Final    Comment: Performed at [facility], [address] [place], [place] [address]    Labs: CBC: Recent Labs  Lab 04/25/24 [phone] 04/26/24 0223 04/30/24 0844  WBC 6.3 7.6 5.2  HGB 8.5* 8.6* 8.5*  HCT 26.4* 27.0* 26.1*  MCV 92.3 91.8 90.3  PLT 166 166 159   Basic Metabolic Panel: Recent Labs  Lab 04/25/24 0850 04/26/24 0223 04/27/24 0426 04/28/24 0352 04/30/24 0322  NA 140 134* 134* 139 134*  K 3.4* 3.4* 3.9 4.3 4.2  CL 97* 95* 96* 100 96*  CO2 29 28 26 25 28   GLUCOSE 93 99 94 91 92  BUN 26* 29* 58* 71* 56*  CREATININE 3.16* 2.97* 4.31* 4.57* 3.53*  CALCIUM  9.2 9.2 9.1 9.1 9.2  MG  --  1.9  --   --   --   PHOS 4.1  --   --  4.4 4.5   Liver Function Tests: Recent Labs  Lab 04/25/24 0850 04/28/24 0352 04/30/24 0322  ALBUMIN 3.5 3.3* 3.4*   CBG: No results for input(s): GLUCAP in the last 168 hours.  Discharge time spent: greater than 30 minutes.  Signed: AIDA CHO, MD [facility] 05/01/2024

## 2024-05-01 NOTE — Progress Notes (Signed)
 [facility]  ROUNDING NOTE   Subjective:   [name] is a 83 y.o. female with past medical conditions including diastolic heart failure, hypertension, hypothyroidism, COPD, and end stage renal disease on hemodialysis. Patient presents to the emergency department complaining of shortness of breath and has been admitted for CHF (congestive heart failure) (HCC) [I50.9] SOB (shortness of breath) [R06.02] Hypoxic [R09.02] Acute respiratory failure with hypoxia (HCC) [J96.01] ESRD on hemodialysis (HCC) [N18.6, Z99.2]  Patient is known to our practice and receives outpatient dialysis treatments at [facility], on a MWF schedule, supervised by Dr Marcelino.   Update: Patient seen sitting up in chair Dressed for discharge No complaints to offer  Objective:  Vital signs in last 24 hours:  Temp:  [97.8 F (36.6 C)-98.8 F (37.1 C)] 97.9 F (36.6 C) (07/31 0734) Pulse Rate:  [54-63] 62 (07/31 0734) Resp:  [18-20] 18 (07/31 0734) BP: (117-158)/(39-54) 158/43 (07/31 0734) SpO2:  [94 %-100 %] 95 % (07/31 0734) FiO2 (%):  [28 %] 28 % (07/30 2120) Weight:  [74.6 kg] 74.6 kg (07/31 0500)  Weight change: -2.256 kg Filed Weights   04/30/24 0809 04/30/24 1136 05/01/24 0500  Weight: 71 kg 70.1 kg 74.6 kg    Intake/Output: I/O last 3 completed shifts: In: 360 [P.O.:360] Out: 900 [Other:900]   Intake/Output this shift:  Total I/O In: 120 [P.O.:120] Out: -   Physical Exam: General: No acute distress  Head: Normocephalic, atraumatic. Moist oral mucosal membranes  Eyes: Anicteric  Neck: Supple  Lungs:  Clear to auscultation, NCO 2  Heart: Regular rate and rhythm  Abdomen:  Soft, nontender  Extremities: No peripheral edema.  Neurologic: Awake, alert, conversant  Skin: Warm,dry, no rash  Access: Left IJ PermCath, left upper aVF    Basic Metabolic Panel: Recent Labs  Lab 04/25/24 0850 04/26/24 0223 04/27/24 0426 04/28/24 0352 04/30/24 0322  NA 140 134* 134*  139 134*  K 3.4* 3.4* 3.9 4.3 4.2  CL 97* 95* 96* 100 96*  CO2 29 28 26 25 28   GLUCOSE 93 99 94 91 92  BUN 26* 29* 58* 71* 56*  CREATININE 3.16* 2.97* 4.31* 4.57* 3.53*  CALCIUM  9.2 9.2 9.1 9.1 9.2  MG  --  1.9  --   --   --   PHOS 4.1  --   --  4.4 4.5    Liver Function Tests: Recent Labs  Lab 04/25/24 0850 04/28/24 0352 04/30/24 0322  ALBUMIN 3.5 3.3* 3.4*   No results for input(s): LIPASE, AMYLASE in the last 168 hours. No results for input(s): AMMONIA in the last 168 hours.  CBC: Recent Labs  Lab 04/25/24 0850 04/26/24 0223 04/30/24 0844  WBC 6.3 7.6 5.2  HGB 8.5* 8.6* 8.5*  HCT 26.4* 27.0* 26.1*  MCV 92.3 91.8 90.3  PLT 166 166 159    Cardiac Enzymes: No results for input(s): CKTOTAL, CKMB, CKMBINDEX, TROPONINI in the last 168 hours.  BNP: Invalid input(s): POCBNP  CBG: No results for input(s): GLUCAP in the last 168 hours.  Microbiology: Results for orders placed or performed during the hospital encounter of 03/09/24  Resp panel by RT-PCR (RSV, Flu A&B, Covid) Anterior Nasal Swab     Status: None   Collection Time: 03/09/24  7:18 PM   Specimen: Anterior Nasal Swab  Result Value Ref Range Status   SARS Coronavirus 2 by RT PCR NEGATIVE NEGATIVE Final    Comment: (NOTE) SARS-CoV-2 target nucleic acids are NOT DETECTED.  The SARS-CoV-2 RNA  is generally detectable in upper respiratory specimens during the acute phase of infection. The lowest concentration of SARS-CoV-2 viral copies this assay can detect is 138 copies/mL. A negative result does not preclude SARS-Cov-2 infection and should not be used as the sole basis for treatment or other patient management decisions. A negative result may occur with  improper specimen collection/handling, submission of specimen other than nasopharyngeal swab, presence of viral mutation(s) within the areas targeted by this assay, and inadequate number of viral copies(<138 copies/mL). A negative  result must be combined with clinical observations, patient history, and epidemiological information. The expected result is Negative.  Fact Sheet for Patients:  BloggerCourse.com  Fact Sheet for Healthcare Providers:  SeriousBroker.it  This test is no t yet approved or cleared by the [place]  FDA and  has been authorized for detection and/or diagnosis of SARS-CoV-2 by FDA under an Emergency Use Authorization (EUA). This EUA will remain  in effect (meaning this test can be used) for the duration of the COVID-19 declaration under Section 564(b)(1) of the Act, 21 U.S.C.section 360bbb-3(b)(1), unless the authorization is terminated  or revoked sooner.       Influenza A by PCR NEGATIVE NEGATIVE Final   Influenza B by PCR NEGATIVE NEGATIVE Final    Comment: (NOTE) The Xpert Xpress SARS-CoV-2/FLU/RSV plus assay is intended as an aid in the diagnosis of influenza from Nasopharyngeal swab specimens and should not be used as a sole basis for treatment. Nasal washings and aspirates are unacceptable for Xpert Xpress SARS-CoV-2/FLU/RSV testing.  Fact Sheet for Patients: BloggerCourse.com  Fact Sheet for Healthcare Providers: SeriousBroker.it  This test is not yet approved or cleared by the [place]  FDA and has been authorized for detection and/or diagnosis of SARS-CoV-2 by FDA under an Emergency Use Authorization (EUA). This EUA will remain in effect (meaning this test can be used) for the duration of the COVID-19 declaration under Section 564(b)(1) of the Act, 21 U.S.C. section 360bbb-3(b)(1), unless the authorization is terminated or revoked.     Resp Syncytial Virus by PCR NEGATIVE NEGATIVE Final    Comment: (NOTE) Fact Sheet for Patients: BloggerCourse.com  Fact Sheet for Healthcare Providers: SeriousBroker.it  This  test is not yet approved or cleared by the [place]  FDA and has been authorized for detection and/or diagnosis of SARS-CoV-2 by FDA under an Emergency Use Authorization (EUA). This EUA will remain in effect (meaning this test can be used) for the duration of the COVID-19 declaration under Section 564(b)(1) of the Act, 21 U.S.C. section 360bbb-3(b)(1), unless the authorization is terminated or revoked.  Performed at [facility], [address] [place], [place] [address]   Respiratory (~20 pathogens) panel by PCR     Status: None   Collection Time: 03/09/24 10:00 PM   Specimen: Nasopharyngeal Swab; Respiratory  Result Value Ref Range Status   Adenovirus NOT DETECTED NOT DETECTED Final   Coronavirus 229E NOT DETECTED NOT DETECTED Final    Comment: (NOTE) The Coronavirus on the Respiratory Panel, DOES NOT test for the novel  Coronavirus (2019 nCoV)    Coronavirus HKU1 NOT DETECTED NOT DETECTED Final   Coronavirus NL63 NOT DETECTED NOT DETECTED Final   Coronavirus OC43 NOT DETECTED NOT DETECTED Final   Metapneumovirus NOT DETECTED NOT DETECTED Final   Rhinovirus / Enterovirus NOT DETECTED NOT DETECTED Final   Influenza A NOT DETECTED NOT DETECTED Final   Influenza B NOT DETECTED NOT DETECTED Final   Parainfluenza Virus 1 NOT DETECTED NOT DETECTED Final  Parainfluenza Virus 2 NOT DETECTED NOT DETECTED Final   Parainfluenza Virus 3 NOT DETECTED NOT DETECTED Final   Parainfluenza Virus 4 NOT DETECTED NOT DETECTED Final   Respiratory Syncytial Virus NOT DETECTED NOT DETECTED Final   Bordetella pertussis NOT DETECTED NOT DETECTED Final   Bordetella Parapertussis NOT DETECTED NOT DETECTED Final   Chlamydophila pneumoniae NOT DETECTED NOT DETECTED Final   Mycoplasma pneumoniae NOT DETECTED NOT DETECTED Final    Comment: Performed at [facility], [address], [place], [place] [address]    Coagulation Studies: No results for input(s): LABPROT, INR in the  last 72 hours.  Urinalysis: No results for input(s): COLORURINE, LABSPEC, PHURINE, GLUCOSEU, HGBUR, BILIRUBINUR, KETONESUR, PROTEINUR, UROBILINOGEN, NITRITE, LEUKOCYTESUR in the last 72 hours.  Invalid input(s): APPERANCEUR    Imaging: No results found.    Medications:      allopurinol   100 mg Oral Daily   amLODipine   5 mg Oral Daily   apixaban   2.5 mg Oral BID   azelastine   2 spray Each Nare BID   Chlorhexidine  Gluconate Cloth  6 each Topical Q0600   epoetin  alfa-epbx (RETACRIT ) injection  4,000 Units Intravenous Q M,W,F-1800   fluticasone   2 spray Each Nare Daily   fluticasone  furoate-vilanterol  1 puff Inhalation Daily   hydrALAZINE   25 mg Oral TID   levothyroxine   150 mcg Oral q morning   metoprolol  tartrate  25 mg Oral BID   montelukast   10 mg Oral QHS   pantoprazole   40 mg Oral Daily   rosuvastatin   40 mg Oral Daily   sertraline   100 mg Oral Daily   sodium chloride  flush  3 mL Intravenous Q12H   torsemide   40 mg Oral Daily   acetaminophen , albuterol , alum & mag hydroxide-simeth, hydrALAZINE , methocarbamol , ondansetron  (ZOFRAN ) IV, mouth rinse, sodium chloride  flush  Assessment/ Plan:  [name] is a 83 y.o.  female with past medical conditions including diastolic heart failure, hypertension, hypothyroidism, COPD, and end stage renal disease on hemodialysis. Patient presents to the emergency department complaining of shortness of breath and has been admitted for CHF (congestive heart failure) (HCC) [I50.9] SOB (shortness of breath) [R06.02] Hypoxic [R09.02] Acute respiratory failure with hypoxia (HCC) [J96.01] ESRD on hemodialysis (HCC) [N18.6, Z99.2]   Acute respiratory failure requiring BiPAP at admission.  Weaned to room air.   2.  End-stage renal disease on hemodialysis.  Next treatment scheduled for Friday.  3. Anemia of chronic kidney disease Lab Results  Component Value Date   HGB 8.5 (L) 04/30/2024  Continue low-dose  EPO with dialysis. Resume Mircera as an outpatient.  4.  Hypertension with chronic kidney disease.  Home regimen includes amlodipine , furosemide , hydralazine , metoprolol , and torsemide .  Continue amlodipine , hydralazine , metoprolol , and torsemide . Blood pressure 158/43    LOS: 6 Raguel Kosloski 7/31/202512:14 PM

## 2024-05-01 NOTE — Progress Notes (Signed)
 Patient will be resuming outpatient clinic MWF at [facility]. Clinic has been called and updated on D/C. Will fax D/C summary when available.    Suzen Satchel Dialysis Navigator [email or web address]

## 2024-05-02 ENCOUNTER — Telehealth: Payer: Self-pay

## 2024-05-02 NOTE — Transitions of Care (Post Inpatient/ED Visit) (Signed)
   05/02/2024  Name: [name] MRN: [id number] DOB: 02-13-1941  Today's TOC FU Call Status: Today's TOC FU Call Status:: Unsuccessful Call (1st Attempt) Unsuccessful Call (1st Attempt) Date: 05/02/24  Attempted to reach the patient regarding the most recent Inpatient/ED visit. Left a HIPAA approved voicemail message to phone number provided in demographics per DPR.    Follow Up Plan: Additional outreach attempts will be made to reach the patient to complete the Transitions of Care (Post Inpatient/ED visit) call.   Richerd Fish, RN, BSN, CCM [facility], [facility] RN Care Manager Direct Dial: [phone]

## 2024-05-05 ENCOUNTER — Telehealth: Payer: Self-pay

## 2024-05-05 NOTE — Transitions of Care (Post Inpatient/ED Visit) (Signed)
   05/05/2024  Name: [name] MRN: [id number] DOB: 1941-06-14  Today's TOC FU Call Status: Today's TOC FU Call Status:: Unsuccessful Call (2nd Attempt) Unsuccessful Call (2nd Attempt) Date: 05/05/24  Attempted to reach the patient regarding the most recent Inpatient/ED visit. Was unable to leave a HIPAA approved voicemail message to preferred phone number provided in demographics. Follow Up Plan: Additional outreach attempts will be made to reach the patient to complete the Transitions of Care (Post Inpatient/ED visit) call.   Richerd Fish, RN, BSN, CCM [facility], [facility] RN Care Manager Direct Dial: [phone]

## 2024-05-06 ENCOUNTER — Ambulatory Visit (INDEPENDENT_AMBULATORY_CARE_PROVIDER_SITE_OTHER): Admitting: Nurse Practitioner

## 2024-05-06 ENCOUNTER — Encounter (INDEPENDENT_AMBULATORY_CARE_PROVIDER_SITE_OTHER)

## 2024-05-08 ENCOUNTER — Ambulatory Visit: Admitting: Physician Assistant

## 2024-05-08 ENCOUNTER — Encounter: Payer: Self-pay | Admitting: Physician Assistant

## 2024-05-08 ENCOUNTER — Telehealth: Payer: Self-pay

## 2024-05-08 VITALS — BP 150/56 | HR 55 | Temp 98.0°F | Ht 62.0 in | Wt 162.5 lb

## 2024-05-08 DIAGNOSIS — R5381 Other malaise: Secondary | ICD-10-CM

## 2024-05-08 DIAGNOSIS — J9621 Acute and chronic respiratory failure with hypoxia: Secondary | ICD-10-CM

## 2024-05-08 DIAGNOSIS — Z09 Encounter for follow-up examination after completed treatment for conditions other than malignant neoplasm: Secondary | ICD-10-CM | POA: Diagnosis not present

## 2024-05-08 DIAGNOSIS — N186 End stage renal disease: Secondary | ICD-10-CM

## 2024-05-08 DIAGNOSIS — G4733 Obstructive sleep apnea (adult) (pediatric): Secondary | ICD-10-CM

## 2024-05-08 DIAGNOSIS — J9622 Acute and chronic respiratory failure with hypercapnia: Secondary | ICD-10-CM | POA: Diagnosis not present

## 2024-05-08 DIAGNOSIS — D638 Anemia in other chronic diseases classified elsewhere: Secondary | ICD-10-CM | POA: Diagnosis not present

## 2024-05-08 DIAGNOSIS — E039 Hypothyroidism, unspecified: Secondary | ICD-10-CM

## 2024-05-08 DIAGNOSIS — I10 Essential (primary) hypertension: Secondary | ICD-10-CM

## 2024-05-08 DIAGNOSIS — Z992 Dependence on renal dialysis: Secondary | ICD-10-CM

## 2024-05-08 DIAGNOSIS — J449 Chronic obstructive pulmonary disease, unspecified: Secondary | ICD-10-CM

## 2024-05-08 DIAGNOSIS — I5033 Acute on chronic diastolic (congestive) heart failure: Secondary | ICD-10-CM

## 2024-05-08 NOTE — Progress Notes (Signed)
 Established patient visit  Patient: [name]   DOB: 11/03/1940   [name]  MRN: [id number] Visit Date: 05/08/2024  Today's healthcare provider: Jolynn Spencer, PA-C   Chief Complaint  Patient presents with   Hospitalization Follow-up    Patient is following up with PCP due to her recent hospital visit on 04/24/2024 - 05/01/2024 (7 days) due to fluid retention regarding Congestive Heart Failure.  Had shortness of breath and couldn't breath.  States that she feels alright since being discharged on 05/01/2024.  Stated that when she had dialysis on Friday that they started giving her Iron.   Subjective     HPI     Hospitalization Follow-up    Additional comments: Patient is following up with PCP due to her recent hospital visit on 04/24/2024 - 05/01/2024 (7 days) due to fluid retention regarding Congestive Heart Failure.  Had shortness of breath and couldn't breath.  States that she feels alright since being discharged on 05/01/2024.  Stated that when she had dialysis on Friday that they started giving her Iron.      Last edited by Terrel Powell CROME, CMA on 05/08/2024 10:13 AM.       Discussed the use of AI scribe software for clinical note transcription with the patient, who gave verbal consent to proceed.  History of Present Illness [name] is an 83 year old female with heart failure and chronic kidney disease who presents for medication management and dialysis follow-up. She is accompanied by her daughter, who is her primary caregiver.  She is on hydralazine , amlodipine , metoprolol , and torsemide  for heart failure. Her hydralazine  dosage was recently reduced from 300 mg to 75 mg per day. She experiences fluid retention around her lungs, complicating removal during dialysis. Recent blood work showed elevated troponin and heart failure markers.  She undergoes hemodialysis three times a week since late March or early April 2025. Her GFR is 12. She experiences chronic anemia  and receives iron supplementation during dialysis sessions. Recent blood work showed normal magnesium  and phosphorus levels.  She has COPD and asthma, using inhalers. She is currently out of albuterol  due to cost issues and has not been prescribed it recently. She uses a CPAP machine with oxygen, which she finds beneficial.  She has gout and issues with steroid use. She is involved in home health physical therapy, which she finds beneficial for exercise ideas. Her daughter is seeking transportation assistance for dialysis appointments as her blood pressure often drops post-dialysis, making it unsafe for her to drive home.  No dizziness, shortness of breath, chest pain, or weakness.  Follow up Hospitalization  Patient was admitted to [facility] on 04/24/24 and discharged on 05/01/24. She was treated for CHF. Treatment for this included fluid management on hemodialysis, torsemide . Telephone follow up was done on 05/08/24 She reports good compliance with treatment. She reports this condition is improved.  ----------------------------------------------------------------------------------------- -      05/08/2024   10:12 AM 03/27/2024    3:18 PM 02/15/2024   10:33 AM  Depression screen PHQ 2/9  Decreased Interest 0 0 1  Down, Depressed, Hopeless 0 0 1  PHQ - 2 Score 0 0 2  Altered sleeping 1 1 3   Tired, decreased energy 1 1 2   Change in appetite 0 0 0  Feeling bad or failure about yourself  0 0 0  Trouble concentrating 0 0 0  Moving slowly or fidgety/restless 0 0 3  Suicidal thoughts 0 0 0  PHQ-9 Score  2 2 10   Difficult doing work/chores Not difficult at all Not difficult at all Not difficult at all      05/08/2024   10:12 AM 03/27/2024    3:18 PM 02/15/2024   10:34 AM  GAD 7 : Generalized Anxiety Score  Nervous, Anxious, on Edge 0 0 0  Control/stop worrying 0 0 0  Worry too much - different things 0 0   Trouble relaxing 0 0 2  Restless 0 0 3  Easily annoyed or irritable 1 0 0  Afraid -  awful might happen 0 0 0  Total GAD 7 Score 1 0   Anxiety Difficulty Not difficult at all Not difficult at all Not difficult at all    Medications: Outpatient Medications Prior to Visit  Medication Sig   acetaminophen  (TYLENOL ) 500 MG tablet Take 1,000 mg by mouth every 6 (six) hours as needed for mild pain or moderate pain.   albuterol  (VENTOLIN  HFA) 108 (90 Base) MCG/ACT inhaler Inhale 2 puffs into the lungs every 6 (six) hours as needed for wheezing or shortness of breath.   allopurinol  (ZYLOPRIM ) 100 MG tablet Take 100 mg by mouth daily.   amLODipine  (NORVASC ) 5 MG tablet Take 1 tablet (5 mg total) by mouth daily.   apixaban  (ELIQUIS ) 2.5 MG TABS tablet Take 1 tablet (2.5 mg total) by mouth 2 (two) times daily.   azelastine  (ASTELIN ) 0.1 % nasal spray Place 2 sprays into both nostrils 2 (two) times daily. Use in each nostril as directed   epoetin  alfa-epbx (RETACRIT ) 4000 UNIT/ML injection Inject 1 mL (4,000 Units total) into the vein every Monday, Wednesday, and Friday at 6 PM.   esomeprazole  (NEXIUM ) 40 MG capsule Take 1 capsule (40 mg total) by mouth daily before breakfast.   ferrous sulfate 325 (65 FE) MG tablet Take 325 mg by mouth daily with breakfast.   fluticasone  (FLONASE ) 50 MCG/ACT nasal spray Place 2 sprays into both nostrils daily.   fluticasone  furoate-vilanterol (BREO ELLIPTA ) 200-25 MCG/ACT AEPB Inhale 1 puff into the lungs daily.   folic acid  (FOLVITE ) 1 MG tablet Take 1 tablet (1 mg total) by mouth daily.   hydrALAZINE  (APRESOLINE ) 25 MG tablet Take 1 tablet (25 mg total) by mouth 3 (three) times daily.   ipratropium-albuterol  (DUONEB) 0.5-2.5 (3) MG/3ML SOLN INHALE THE CONTENTS OF ONE (1) VIAL VIA NEBULIZATION EVERY 6 HOURS AS NEEDED FORSHORTNESS OF BREATH   levothyroxine  (SYNTHROID ) 150 MCG tablet Take 150 mcg by mouth every morning.   lidocaine -prilocaine  (EMLA ) cream as directed. Before dialysis   loperamide  (IMODIUM ) 2 MG capsule Take 1 capsule (2 mg total) by mouth  as needed for diarrhea or loose stools.   metoprolol  tartrate (LOPRESSOR ) 50 MG tablet Take 0.5 tablets (25 mg total) by mouth 2 (two) times daily.   montelukast  (SINGULAIR ) 10 MG tablet Take 1 tablet (10 mg total) by mouth at bedtime.   Multiple Minerals-Vitamins (CALCIUM  & VIT D3 BONE HEALTH PO) Take 1 tablet by mouth daily. 600 mg/ 25 mg   nystatin powder Apply 1 Application topically 3 (three) times daily.   rosuvastatin  (CRESTOR ) 40 MG tablet Take 40 mg by mouth daily.   senna-docusate (SENOKOT-S) 8.6-50 MG tablet Take 2 tablets by mouth at bedtime as needed for mild constipation.   sertraline  (ZOLOFT ) 100 MG tablet TAKE 1 TABLET BY MOUTH DAILY   torsemide  (DEMADEX ) 20 MG tablet Take 40 mg by mouth daily.   vitamin B-12 (CYANOCOBALAMIN ) 500 MCG tablet Take 500 mcg by mouth daily.  No facility-administered medications prior to visit.    Review of Systems All negative Except see HPI       Objective    BP (!) 150/56 (BP Location: Right Arm, Patient Position: Sitting, Cuff Size: Normal)   Pulse (!) 55   Temp 98 F (36.7 C) (Oral)   Ht 5' 2 (1.575 m)   Wt 162 lb 8 oz (73.7 kg)   SpO2 95%   BMI 29.72 kg/m     Physical Exam Vitals reviewed.  Constitutional:      General: She is not in acute distress.    Appearance: Normal appearance. She is well-developed. She is not diaphoretic.  HENT:     Head: Normocephalic and atraumatic.  Eyes:     General: No scleral icterus.    Conjunctiva/sclera: Conjunctivae normal.  Neck:     Thyroid : No thyromegaly.  Cardiovascular:     Rate and Rhythm: Normal rate and regular rhythm.     Pulses: Normal pulses.     Heart sounds: Normal heart sounds. No murmur heard. Pulmonary:     Effort: Pulmonary effort is normal. No respiratory distress.     Breath sounds: Normal breath sounds. No wheezing, rhonchi or rales.  Musculoskeletal:     Cervical back: Neck supple.     Right lower leg: No edema.     Left lower leg: No edema.   Lymphadenopathy:     Cervical: No cervical adenopathy.  Skin:    General: Skin is warm and dry.     Findings: No rash.  Neurological:     Mental Status: She is alert and oriented to person, place, and time. Mental status is at baseline.  Psychiatric:        Mood and Affect: Mood normal.        Behavior: Behavior normal.      No results found for any visits on 05/08/24.      Assessment & Plan Hospital discharge follow-up (Primary) CHF Acute dyspnea Acute on chronic diastolic CHF Advised to continue with torsemide  ESRD Continue with hemodialysis MWF Hypertension Continue with hydralazine  25mg   3 times daily, amlodipine , metoprolol  and torsemide  COPD  Continue with bronchodilators Debility Continue with HH PT - Basic metabolic panel with GFR - Pro b natriuretic peptide (BNP)[facility] [facility]) - AMB Referral [facility] Care Management Will follow-up  Anemia of chronic disease Chronic Continue with darbepoetin Managed by Oncology  Acquired hypothyroidism Chronic and stable Continue taking levothyroxine  Will follow-up  OSA on CPAP Chronic and continues with CPAP Will follow-up  Heart failure Heart failure well-managed with current medications. Elevated troponin levels consistent with heart failure. - Continue hydralazine  25 tid, amlodipine , metoprolol , and torsemide . - Check basic blood work including heart failure markers and kidney function enzymes. - Coordinate with cardiologist for further management.  End-stage renal disease on hemodialysis Undergoing hemodialysis thrice weekly. Fluid accumulation around lungs challenging to remove. GFR is 12. - Continue hemodialysis sessions as scheduled.  Chronic obstructive pulmonary disease (COPD) COPD well-managed. Pulmonologist attributes respiratory issues to heart failure. CPAP and oxygen therapy crucial. Caution with albuterol  due to heart condition. - Continue CPAP and oxygen therapy. -  Consult with pulmonologist regarding inhaler use and necessity.  Anemia of chronic kidney disease Anemia managed with iron supplementation during dialysis. - Continue iron supplementation during dialysis.  Hypertension Chronic and unstable Hypertension managed with hydralazine , amlodipine , and metoprolol  as part of heart failure treatment. - Continue current antihypertensive medications. Advised to measure BP at home daily. Contact  us  back if BP will stay high Will follow-up  Obstructive sleep apnea (on CPAP) Obstructive sleep apnea effectively managed with CPAP therapy. - Continue CPAP therapy.   Topics to discuss at the follow-up: DM, HLD, PAF on elequis 2.5 mg BID, GERD, gout Pt seems confused about her medications. Needs an assistance and an assessment. Referral to [facility]/pharmacy was placed  Orders Placed This Encounter  Procedures   Basic metabolic panel with GFR   Pro b natriuretic peptide (BNP)[facility] [facility])   AMB Referral [facility] Care Management    Referral Priority:   Routine    Referral Type:   Consultation    Referral Reason:   Care Coordination    Number of Visits Requested:   1    Return in about 6 weeks (around 06/19/2024) for chronic disease f/u.   The patient was advised to call back or seek an in-person evaluation if the symptoms worsen or if the condition fails to improve as anticipated.  I discussed the assessment and treatment plan with the patient. The patient was provided an opportunity to ask questions and all were answered. The patient agreed with the plan and demonstrated an understanding of the instructions.  I, Praneel Haisley, PA-C have reviewed all documentation for this visit. The documentation on 05/08/2024  for the exam, diagnosis, procedures, and orders are all accurate and complete.  Jolynn Spencer, [facility], MMS [facility] [phone] (phone) [phone] (fax)  [facility]

## 2024-05-08 NOTE — Transitions of Care (Post Inpatient/ED Visit) (Signed)
   05/08/2024  Name: [name] MRN: [id number] DOB: 1941-06-13  Today's TOC FU Call Status: Today's TOC FU Call Status:: Unsuccessful Call (3rd Attempt) Unsuccessful Call (3rd Attempt) Date: 05/08/24  Attempted to reach the patient regarding the most recent Inpatient/ED visit. Unable to leae voicemail message with 3rd call attempted  Follow Up Plan: No further outreach attempts will be made at this time. We have been unable to contact the patient.  Richerd Fish, RN, BSN, CCM [facility], [facility] RN Care Manager Direct Dial: [phone]

## 2024-05-09 ENCOUNTER — Telehealth: Payer: Self-pay

## 2024-05-09 DIAGNOSIS — Z09 Encounter for follow-up examination after completed treatment for conditions other than malignant neoplasm: Secondary | ICD-10-CM | POA: Insufficient documentation

## 2024-05-09 DIAGNOSIS — D638 Anemia in other chronic diseases classified elsewhere: Secondary | ICD-10-CM | POA: Insufficient documentation

## 2024-05-09 NOTE — Progress Notes (Signed)
 Complex Care Management Note Care Guide Note  05/09/2024 Name: [name] MRN: [id number] DOB: March 04, 1941   Complex Care Management Outreach Attempts: An unsuccessful telephone outreach was attempted today to offer the patient information about available complex care management services.  Follow Up Plan:  Additional outreach attempts will be made to offer the patient complex care management information and services.   Encounter Outcome:  No Answer  Dreama [facility]  [facility], [facility] Care Management Assistant Direct Dial: [phone]  Fax: [phone]

## 2024-05-10 ENCOUNTER — Encounter: Payer: Self-pay | Admitting: Physician Assistant

## 2024-05-10 NOTE — Progress Notes (Signed)
 Cardiology Clinic Note   Date: 05/13/2024 ID: KEYSHAWNA PROUSE, DOB 01-28-1941, MRN [id number]  Primary Cardiologist:  Deatrice Cage, MD  Chief Complaint   [name] is a 83 y.o. female who presents to the clinic today for hospital follow up.   Patient Profile   [name] is followed by Dr. Cage for the history outlined below.       Past medical history significant for: Nonobstructive CAD. LHC 08/13/2014 (angina): Ostial D2 20%.  Ostial LCx 20%. Chronic HFpEF. Echo 04/09/2024 performed at [facility]: EF > 55%.  No RWMA.  Mild concentric LVH, Grade II DD.  Normal RV function.  Severe LAE.  Moderate MR, moderate MAC.  Mild TR. PAF. Onset July 2024. 14-day ZIO 06/25/2023: HR 45 to 154 bpm, average 56 bpm.  2 runs of NSVT fastest lasting 4 beats max rate 154 bpm, longest lasting 8 beats average rate 129 bpm.  43 runs of SVT fastest lasting 6 beats max rate 150 bpm, longest lasting 12.7 seconds average rate 103 bpm.  Idioventricular rhythm was present.  Rare ectopy.  Patient triggered events associated with NSR. Hypertension. Hyperlipidemia. OSA. COPD. GERD. T2DM. Hypothyroidism. ESRD on HD. MWF.  In summary, patient was first evaluated by Dr. Cage on 07/24/2014 for chest pain and shortness of breath at the request of Dr. Ike.  She underwent nuclear stress testing which is a moderate risk study demonstrating mid apical, inferior lateral perfusion defect with mild reversibility suggestive of mild to moderate infarct with mild peri-infarct ischemia.  She underwent LHC which showed mild nonobstructive CAD as detailed above.  Patient underwent hospital admission 03/23/2023 to 03/31/2023 due to hypocalcemia and hypomagnesia.  She was rehospitalized 04/16/2023 to 04/22/2023 with generalized weakness and shortness of breath.  She was found to have A-fib on telemetry.  Echo demonstrated normal LV function as detailed above.  She presented back to [facility] emergency department on  05/22/2023 with complaints of shortness of breath.  She was found to be in A-fib with a rate of 106 bpm.  She spontaneously converted.  She was placed on Eliquis  2.5 mg twice daily for stroke prophylaxis.  She was placed on a 14-day ZIO which demonstrated runs of NSVT and SVT as detailed above.  Patient underwent hospital admission from 12/21/2023 to 01/01/2024 for pneumonia, COPD exacerbation, CHF exacerbation.  Patient presented to the ED on 12/21/2023 with complaints of shortness of breath, cough, fever.  Upon EMS arrival SpO2 was 90% on room air improving to 93% on 4 L.  Plan to start patient on dialysis to help manage volume.  Her hospital course was complicated by AV fistula malfunction and she underwent angioplasty and stent placement with vascular surgery.  She was discharged on 01/01/2024 with plan to begin outpatient dialysis.   Patient was seen in the office on 01/17/2024 for hospital follow up.  She was tolerating hemodialysis well with no issues of hypotension since holding hydralazine  after dialysis.  She was pending home PT.  She reported occasional palpitations described as heart racing with exertion that resolved with rest.  Patient was seen in the ED on 03/05/2024 with complaints of shortness of breath.  EKG demonstrated sinus rhythm.  She was given breathing treatments with improvement of symptoms and discharged on doxycycline .  She was found to be hypokalemic and potassium was repleted with p.o. potassium.  She again presented to the ED on 03/09/2024 with complaints of worsening shortness of breath and chest pain radiating to back.  Initial labs: WBC 8.6, hemoglobin 8.5, hematocrit 26.1, sodium 135, potassium 3.8, creatinine 3.21, BUN 75, BNP 2527.  Troponin 37>> 43.  Respiratory panel negative.  Chest x-ray showed worsening right greater than left bibasilar atelectasis, small left pleural effusion.  She was admitted for COPD/CHF exacerbation.  She was treated with breathing treatments and given a  dose of IV Lasix .  Echo demonstrated EF 55 to 60% (further details above).  She was discharged on 03/12/2024.    Patient presented to the ED at [facility] on 04/08/2024 from dialysis for left fistula not working.  She was having difficulty laying flat secondary to shortness of breath.  She was admitted for respiratory failure.  Chest x-ray demonstrated pulmonary edema.  She was switched to torsemide  40 mg daily by nephrology.  She was Rx supplemental O2 to use with CPAP at home.  She was discharged on 04/11/2024.  Patient presented to the ED on 04/24/2024 for shortness of breath.  Per EMS she had been doing well on room air en route but became hypoxic in the 80s and was placed on CPAP.  Upon arrival to ED she was switched to BiPAP.  Chest x-ray demonstrated pulmonary congestion.  Patient admitted to increase sodium intake.  She underwent urgent dialysis for volume overload.  She was discharged on 05/01/2024.     History of Present Illness    Today, patient reports she is doing well since discharge from hospital. Patient denies lower extremity edema, orthopnea or PND. Chronic dyspnea is back to baseline. She is not using her nebulizer secondary to not being able to afford the medication. No chest pain, pressure, or tightness. No palpitations.  Hydralazine  was decreased by PCP secondary to BP dropping with dialysis. She still makes urine and reports brisk diuresis with current dose of torsemide . Her niece helps her manage medications and accidentally set her torsemide  in her PM pills. Patient has not changed them to the AM yet. She reports recently she has had to ask the dialysis techs to stop dialysis and give her fluids because they are pulling off too much too fast. She does not take torsemide  on dialysis days.     ROS: All other systems reviewed and are otherwise negative except as noted in History of Present Illness.  EKGs/Labs Reviewed       EKG is not ordered today.   04/24/2024: ALT  19; AST 34 04/30/2024: BUN 56; Creatinine, Ser 3.53; Potassium 4.2; Sodium 134   04/30/2024: Hemoglobin 8.5; WBC 5.2   04/24/2024: TSH 2.468   04/24/2024: B Natriuretic Peptide 1,057.8    Risk Assessment/Calculations     CHA2DS2-VASc Score = 6   This indicates a 9.7% annual risk of stroke. The patient's score is based upon: CHF History: 1 HTN History: 1 Diabetes History: 1 Stroke History: 0 Vascular Disease History: 0 Age Score: 2 Gender Score: 1     HYPERTENSION CONTROL Vitals:   05/13/24 1511 05/13/24 1738  BP: (!) 168/62 (!) 140/60    The patient's blood pressure is elevated above target today.  In order to address the patient's elevated BP:            Physical Exam    VS:  BP (!) 140/60 (BP Location: Right Arm, Patient Position: Sitting, Cuff Size: Large)   Pulse 65   Ht 5' 2 (1.575 m)   Wt 164 lb 6.4 oz (74.6 kg)   SpO2 96%   BMI 30.07 kg/m  , BMI Body mass index is  30.07 kg/m.  GEN: Well nourished, well developed, in no acute distress. Neck: No JVD or carotid bruits. Cardiac:  RRR. 2/6 systolic murmur. No rubs or gallops.   Respiratory:  Respirations regular and unlabored. Diminished breath sounds bilaterally without rales, wheezing or rhonchi. GI: Soft, nontender, nondistended. Extremities: Radials/DP/PT 2+ and equal bilaterally. No clubbing or cyanosis. No edema.  Skin: Warm and dry, no rash. Neuro: Strength intact.  Assessment & Plan   Nonobstructive CAD LHC November 2015 showed ostial D2 20%, ostial LCx 20%.  Patient denies chest pain, pressure or tightness.  -Continue amlodipine , metoprolol , rosuvastatin .  Patient not on aspirin  secondary to Eliquis .   Chronic HFpEF Echo echo July 2025 showed EF >55%, grade Grade II DD, normal RV function, severe LAE, severe MR, mild TR. Patient with 3 hospitalizations since June.  Patient reports chronic shortness of breath back to baseline. No lower extremity edema, orthopnea or PND.  Euvolemic and well  compensated on exam. Diminished breath sounds without wheezing, rhonchi or rales.  - Instructed patient to skip dose of torsemide  tonight and tomorrow. Resume AM dosing on Thursday. If she continues to feel that dialysis is pulling off too much fluid she is to reduce torsemide  to 20 mg. Patient verbalized understanding and agreement with plan.  -Continue metoprolol , hydralazine , torsemide .  Patient not a candidate for SGLT2i or MRA secondary to kidney function. - Volume managed by hemodialysis.   Palpitations/PAF Onset July 2024.  14-day ZIO September 2024 showed HR 45 to 154 bpm, average 56 bpm, 2 runs of NSVT, 43 runs of SVT, rare ectopy.  Denies spontaneous bleeding concerns.  Patient reports occasional palpitations described as heart racing with exertion that resolves quickly with rest. RRR on exam today.  -Continue metoprolol , Eliquis . Appropriate Eliquis  dose.   Hypertension BP today 168/82 on intake and 140/60 on my recheck. Hydralazine  recently decreased by PCP. No report of headaches or dizziness.  -Continue amlodipine , hydralazine , metoprolol .   ESRD Undergoing hemodialysis MWF. She continues to make urine.  She is holding hydralazine  until after dialysis secondary to hypotension during treatment. She does not taken torsemide  on dialysis days.  -Continue to follow with nephrology.  Disposition: Return in 6 months or sooner as needed.          Signed, Barnie HERO. Janet Humphreys, DNP, NP-C

## 2024-05-12 ENCOUNTER — Other Ambulatory Visit: Payer: Self-pay | Admitting: Physician Assistant

## 2024-05-12 DIAGNOSIS — F32A Depression, unspecified: Secondary | ICD-10-CM

## 2024-05-12 NOTE — Progress Notes (Signed)
 Complex Care Management Note Care Guide Note  05/12/2024 Name: [name] MRN: [id number] DOB: 1941/09/04   Complex Care Management Outreach Attempts: A second unsuccessful outreach was attempted today to offer the patient with information about available complex care management services.  Follow Up Plan:  Additional outreach attempts will be made to offer the patient complex care management information and services.   Encounter Outcome:  No Answer  Dreama [facility]  [facility], [facility] Care Management Assistant Direct Dial: [phone]  Fax: [phone]

## 2024-05-13 ENCOUNTER — Encounter: Payer: Self-pay | Admitting: Student

## 2024-05-13 ENCOUNTER — Ambulatory Visit: Attending: Student | Admitting: Student

## 2024-05-13 VITALS — BP 140/60 | HR 65 | Ht 62.0 in | Wt 164.4 lb

## 2024-05-13 DIAGNOSIS — I480 Paroxysmal atrial fibrillation: Secondary | ICD-10-CM | POA: Diagnosis not present

## 2024-05-13 DIAGNOSIS — I5032 Chronic diastolic (congestive) heart failure: Secondary | ICD-10-CM | POA: Diagnosis not present

## 2024-05-13 DIAGNOSIS — N186 End stage renal disease: Secondary | ICD-10-CM | POA: Diagnosis present

## 2024-05-13 DIAGNOSIS — I10 Essential (primary) hypertension: Secondary | ICD-10-CM | POA: Diagnosis present

## 2024-05-13 DIAGNOSIS — I2510 Atherosclerotic heart disease of native coronary artery without angina pectoris: Secondary | ICD-10-CM | POA: Diagnosis not present

## 2024-05-13 DIAGNOSIS — R002 Palpitations: Secondary | ICD-10-CM | POA: Diagnosis not present

## 2024-05-13 DIAGNOSIS — Z992 Dependence on renal dialysis: Secondary | ICD-10-CM | POA: Diagnosis present

## 2024-05-13 NOTE — Patient Instructions (Addendum)
 Medication Instructions:  Your physician recommends that you continue on your current medications as directed. Please refer to the Current Medication list given to you today.   *If you need a refill on your cardiac medications before your next appointment, please call your pharmacy*  Lab Work: None ordered at this time  If you have labs (blood work) drawn today and your tests are completely normal, you will receive your results only by: MyChart Message (if you have MyChart) OR A paper copy in the mail If you have any lab test that is abnormal or we need to change your treatment, we will call you to review the results.  Testing/Procedures: None ordered at this time   Follow-Up: At [facility], you and your health needs are our priority.  As part of our continuing mission to provide you with exceptional heart care, our providers are all part of one team.  This team includes your primary Cardiologist (physician) and Advanced Practice Providers or APPs (Physician Assistants and Nurse Practitioners) who all work together to provide you with the care you need, when you need it.  Your next appointment:   6 month(s)  Provider:   Deatrice Cage, MD or Barnie Hila, NP    We recommend signing up for the patient portal called MyChart.  Sign up information is provided on this After Visit Summary.  MyChart is used to connect with patients for Virtual Visits (Telemedicine).  Patients are able to view lab/test results, encounter notes, upcoming appointments, etc.  Non-urgent messages can be sent to your provider as well.   To learn more about what you can do with MyChart, go to [email or web address].   Other Instructions  Do Not take Torsemide  tonight or tomorrow. Resume Torsemide  40 mg once daily on Thursday. If dialysis is pulling too much fluid, decrease Torsemide  to 20 mg once daily.

## 2024-05-14 NOTE — Progress Notes (Signed)
 Complex Care Management Note Care Guide Note  05/14/2024 Name: [name] MRN: [id number] DOB: Apr 16, 1941   Complex Care Management Outreach Attempts: A third unsuccessful outreach was attempted today to offer the patient with information about available complex care management services.  Follow Up Plan:  No further outreach attempts will be made at this time. We have been unable to contact the patient to offer or enroll patient in complex care management services.  Encounter Outcome:  No Answer  Dreama [facility]  [facility], [facility] Health Care Management Assistant Direct Dial: [phone]  Fax: [phone]

## 2024-05-16 ENCOUNTER — Telehealth: Payer: Self-pay | Admitting: Physician Assistant

## 2024-05-16 NOTE — Telephone Encounter (Signed)
 Copied from CRM [id number]. Topic: General - Other >> May 16, 2024 11:02 AM Zebedee SAUNDERS wrote: Reason for CRM: Received call from [facility] per Zacharia ph: [phone] stated pt's evaluation was very good.

## 2024-05-18 ENCOUNTER — Other Ambulatory Visit: Payer: Self-pay

## 2024-05-18 ENCOUNTER — Encounter: Payer: Self-pay | Admitting: Emergency Medicine

## 2024-05-18 ENCOUNTER — Emergency Department

## 2024-05-18 ENCOUNTER — Inpatient Hospital Stay (HOSPITAL_COMMUNITY)
Admit: 2024-05-18 | Discharge: 2024-05-18 | Disposition: A | Discharge status: Home / Self Care | Attending: Hospitalist | Admitting: Hospitalist

## 2024-05-18 ENCOUNTER — Inpatient Hospital Stay
Admission: EM | Admit: 2024-05-18 | Discharge: 2024-05-23 | DRG: 291 | Disposition: A | Discharge status: Home Health | Attending: Osteopathic Medicine | Admitting: Osteopathic Medicine

## 2024-05-18 DIAGNOSIS — Z803 Family history of malignant neoplasm of breast: Secondary | ICD-10-CM

## 2024-05-18 DIAGNOSIS — Z992 Dependence on renal dialysis: Secondary | ICD-10-CM | POA: Diagnosis not present

## 2024-05-18 DIAGNOSIS — E1122 Type 2 diabetes mellitus with diabetic chronic kidney disease: Secondary | ICD-10-CM | POA: Diagnosis present

## 2024-05-18 DIAGNOSIS — Z7901 Long term (current) use of anticoagulants: Secondary | ICD-10-CM | POA: Diagnosis not present

## 2024-05-18 DIAGNOSIS — Z91141 Patient's other noncompliance with medication regimen due to financial hardship: Secondary | ICD-10-CM

## 2024-05-18 DIAGNOSIS — J449 Chronic obstructive pulmonary disease, unspecified: Secondary | ICD-10-CM | POA: Diagnosis present

## 2024-05-18 DIAGNOSIS — I132 Hypertensive heart and chronic kidney disease with heart failure and with stage 5 chronic kidney disease, or end stage renal disease: Secondary | ICD-10-CM | POA: Diagnosis present

## 2024-05-18 DIAGNOSIS — J441 Chronic obstructive pulmonary disease with (acute) exacerbation: Secondary | ICD-10-CM | POA: Diagnosis present

## 2024-05-18 DIAGNOSIS — E890 Postprocedural hypothyroidism: Secondary | ICD-10-CM | POA: Diagnosis present

## 2024-05-18 DIAGNOSIS — J9622 Acute and chronic respiratory failure with hypercapnia: Secondary | ICD-10-CM | POA: Diagnosis present

## 2024-05-18 DIAGNOSIS — I2510 Atherosclerotic heart disease of native coronary artery without angina pectoris: Secondary | ICD-10-CM | POA: Diagnosis present

## 2024-05-18 DIAGNOSIS — Z90711 Acquired absence of uterus with remaining cervical stump: Secondary | ICD-10-CM

## 2024-05-18 DIAGNOSIS — Z6828 Body mass index (BMI) 28.0-28.9, adult: Secondary | ICD-10-CM

## 2024-05-18 DIAGNOSIS — Z841 Family history of disorders of kidney and ureter: Secondary | ICD-10-CM

## 2024-05-18 DIAGNOSIS — Z79899 Other long term (current) drug therapy: Secondary | ICD-10-CM | POA: Diagnosis not present

## 2024-05-18 DIAGNOSIS — N2581 Secondary hyperparathyroidism of renal origin: Secondary | ICD-10-CM | POA: Diagnosis present

## 2024-05-18 DIAGNOSIS — E1169 Type 2 diabetes mellitus with other specified complication: Secondary | ICD-10-CM | POA: Diagnosis present

## 2024-05-18 DIAGNOSIS — E663 Overweight: Secondary | ICD-10-CM | POA: Diagnosis present

## 2024-05-18 DIAGNOSIS — Z5986 Financial insecurity: Secondary | ICD-10-CM

## 2024-05-18 DIAGNOSIS — D72829 Elevated white blood cell count, unspecified: Secondary | ICD-10-CM | POA: Diagnosis present

## 2024-05-18 DIAGNOSIS — Z882 Allergy status to sulfonamides status: Secondary | ICD-10-CM

## 2024-05-18 DIAGNOSIS — Z85828 Personal history of other malignant neoplasm of skin: Secondary | ICD-10-CM

## 2024-05-18 DIAGNOSIS — Z8261 Family history of arthritis: Secondary | ICD-10-CM

## 2024-05-18 DIAGNOSIS — N186 End stage renal disease: Secondary | ICD-10-CM | POA: Diagnosis present

## 2024-05-18 DIAGNOSIS — G4733 Obstructive sleep apnea (adult) (pediatric): Secondary | ICD-10-CM | POA: Diagnosis present

## 2024-05-18 DIAGNOSIS — D509 Iron deficiency anemia, unspecified: Secondary | ICD-10-CM | POA: Diagnosis present

## 2024-05-18 DIAGNOSIS — Z91018 Allergy to other foods: Secondary | ICD-10-CM

## 2024-05-18 DIAGNOSIS — D631 Anemia in chronic kidney disease: Secondary | ICD-10-CM | POA: Diagnosis present

## 2024-05-18 DIAGNOSIS — I509 Heart failure, unspecified: Secondary | ICD-10-CM

## 2024-05-18 DIAGNOSIS — Z9981 Dependence on supplemental oxygen: Secondary | ICD-10-CM

## 2024-05-18 DIAGNOSIS — Z79890 Hormone replacement therapy (postmenopausal): Secondary | ICD-10-CM

## 2024-05-18 DIAGNOSIS — I5031 Acute diastolic (congestive) heart failure: Secondary | ICD-10-CM | POA: Diagnosis not present

## 2024-05-18 DIAGNOSIS — I959 Hypotension, unspecified: Secondary | ICD-10-CM | POA: Diagnosis not present

## 2024-05-18 DIAGNOSIS — K7581 Nonalcoholic steatohepatitis (NASH): Secondary | ICD-10-CM | POA: Diagnosis present

## 2024-05-18 DIAGNOSIS — Z825 Family history of asthma and other chronic lower respiratory diseases: Secondary | ICD-10-CM

## 2024-05-18 DIAGNOSIS — K219 Gastro-esophageal reflux disease without esophagitis: Secondary | ICD-10-CM | POA: Diagnosis present

## 2024-05-18 DIAGNOSIS — E039 Hypothyroidism, unspecified: Secondary | ICD-10-CM | POA: Diagnosis not present

## 2024-05-18 DIAGNOSIS — D508 Other iron deficiency anemias: Secondary | ICD-10-CM | POA: Diagnosis not present

## 2024-05-18 DIAGNOSIS — I10 Essential (primary) hypertension: Secondary | ICD-10-CM | POA: Diagnosis not present

## 2024-05-18 DIAGNOSIS — Z5982 Transportation insecurity: Secondary | ICD-10-CM

## 2024-05-18 DIAGNOSIS — J9601 Acute respiratory failure with hypoxia: Principal | ICD-10-CM

## 2024-05-18 DIAGNOSIS — Z1152 Encounter for screening for COVID-19: Secondary | ICD-10-CM | POA: Diagnosis not present

## 2024-05-18 DIAGNOSIS — Z794 Long term (current) use of insulin: Secondary | ICD-10-CM

## 2024-05-18 DIAGNOSIS — Z87442 Personal history of urinary calculi: Secondary | ICD-10-CM

## 2024-05-18 DIAGNOSIS — I4891 Unspecified atrial fibrillation: Secondary | ICD-10-CM | POA: Diagnosis present

## 2024-05-18 DIAGNOSIS — J9621 Acute and chronic respiratory failure with hypoxia: Secondary | ICD-10-CM | POA: Diagnosis present

## 2024-05-18 DIAGNOSIS — I480 Paroxysmal atrial fibrillation: Secondary | ICD-10-CM | POA: Diagnosis present

## 2024-05-18 DIAGNOSIS — Z888 Allergy status to other drugs, medicaments and biological substances status: Secondary | ICD-10-CM

## 2024-05-18 DIAGNOSIS — J810 Acute pulmonary edema: Secondary | ICD-10-CM

## 2024-05-18 DIAGNOSIS — E785 Hyperlipidemia, unspecified: Secondary | ICD-10-CM | POA: Diagnosis present

## 2024-05-18 DIAGNOSIS — J439 Emphysema, unspecified: Secondary | ICD-10-CM | POA: Diagnosis not present

## 2024-05-18 DIAGNOSIS — E669 Obesity, unspecified: Secondary | ICD-10-CM | POA: Diagnosis present

## 2024-05-18 DIAGNOSIS — Z833 Family history of diabetes mellitus: Secondary | ICD-10-CM

## 2024-05-18 DIAGNOSIS — J811 Chronic pulmonary edema: Secondary | ICD-10-CM | POA: Diagnosis present

## 2024-05-18 DIAGNOSIS — I5033 Acute on chronic diastolic (congestive) heart failure: Secondary | ICD-10-CM | POA: Diagnosis present

## 2024-05-18 DIAGNOSIS — Z8249 Family history of ischemic heart disease and other diseases of the circulatory system: Secondary | ICD-10-CM

## 2024-05-18 DIAGNOSIS — M810 Age-related osteoporosis without current pathological fracture: Secondary | ICD-10-CM | POA: Diagnosis present

## 2024-05-18 DIAGNOSIS — T486X6A Underdosing of antiasthmatics, initial encounter: Secondary | ICD-10-CM | POA: Diagnosis present

## 2024-05-18 LAB — ECHOCARDIOGRAM COMPLETE
AR max vel: 1.83 cm2
AV Area VTI: 1.84 cm2
AV Area mean vel: 1.84 cm2
AV Mean grad: 8 mmHg
AV Peak grad: 14.1 mmHg
Ao pk vel: 1.88 m/s
Area-P 1/2: 7.02 cm2
Calc EF: 48.5 %
Height: 62 in
MV VTI: 2.15 cm2
S' Lateral: 4.4 cm
Single Plane A2C EF: 48.9 %
Single Plane A4C EF: 46.1 %
Weight: 2660.8 [oz_av]

## 2024-05-18 LAB — RESP PANEL BY RT-PCR (RSV, FLU A&B, COVID)  RVPGX2
Influenza A by PCR: NEGATIVE
Influenza B by PCR: NEGATIVE
Resp Syncytial Virus by PCR: NEGATIVE
SARS Coronavirus 2 by RT PCR: NEGATIVE

## 2024-05-18 LAB — COMPREHENSIVE METABOLIC PANEL WITH GFR
ALT: 22 U/L (ref 0–44)
AST: 38 U/L (ref 15–41)
Albumin: 4 g/dL (ref 3.5–5.0)
Alkaline Phosphatase: 108 U/L (ref 38–126)
Anion gap: 12 (ref 5–15)
BUN: 36 mg/dL — ABNORMAL HIGH (ref 8–23)
CO2: 25 mmol/L (ref 22–32)
Calcium: 9.3 mg/dL (ref 8.9–10.3)
Chloride: 103 mmol/L (ref 98–111)
Creatinine, Ser: 3.16 mg/dL — ABNORMAL HIGH (ref 0.44–1.00)
GFR, Estimated: 14 mL/min — ABNORMAL LOW (ref >60)
Glucose, Bld: 183 mg/dL — ABNORMAL HIGH (ref 70–99)
Potassium: 4.2 mmol/L (ref 3.5–5.1)
Sodium: 140 mmol/L (ref 135–145)
Total Bilirubin: 1 mg/dL (ref 0.0–1.2)
Total Protein: 7.6 g/dL (ref 6.5–8.1)

## 2024-05-18 LAB — BLOOD GAS, VENOUS
Acid-Base Excess: 3.2 mmol/L — ABNORMAL HIGH (ref 0.0–2.0)
Bicarbonate: 31.2 mmol/L — ABNORMAL HIGH (ref 20.0–28.0)
Delivery systems: POSITIVE
FIO2: 60 %
O2 Saturation: 80.9 %
Patient temperature: 37
pCO2, Ven: 62 mmHg — ABNORMAL HIGH (ref 44–60)
pH, Ven: 7.31 (ref 7.25–7.43)
pO2, Ven: 54 mmHg — ABNORMAL HIGH (ref 32–45)

## 2024-05-18 LAB — CBC
HCT: 35 % — ABNORMAL LOW (ref 36.0–46.0)
Hemoglobin: 10.5 g/dL — ABNORMAL LOW (ref 12.0–15.0)
MCH: 28.8 pg (ref 26.0–34.0)
MCHC: 30 g/dL (ref 30.0–36.0)
MCV: 96.2 fL (ref 80.0–100.0)
Platelets: 202 K/uL (ref 150–400)
RBC: 3.64 MIL/uL — ABNORMAL LOW (ref 3.87–5.11)
RDW: 16 % — ABNORMAL HIGH (ref 11.5–15.5)
WBC: 12 K/uL — ABNORMAL HIGH (ref 4.0–10.5)
nRBC: 0 % (ref 0.0–0.2)

## 2024-05-18 LAB — CBG MONITORING, ED
Glucose-Capillary: 190 mg/dL — ABNORMAL HIGH (ref 70–99)
Glucose-Capillary: 93 mg/dL (ref 70–99)
Glucose-Capillary: 108 mg/dL — ABNORMAL HIGH (ref 70–99)

## 2024-05-18 LAB — HEPATITIS B SURFACE ANTIGEN
Hepatitis B Surface Ag: NONREACTIVE
Hepatitis B Surface Ag: NONREACTIVE

## 2024-05-18 LAB — TROPONIN I (HIGH SENSITIVITY)
Troponin I (High Sensitivity): 32 ng/L — ABNORMAL HIGH (ref <18)
Troponin I (High Sensitivity): 35 ng/L — ABNORMAL HIGH (ref <18)

## 2024-05-18 LAB — BRAIN NATRIURETIC PEPTIDE: B Natriuretic Peptide: 2692.7 pg/mL — ABNORMAL HIGH (ref 0.0–100.0)

## 2024-05-18 MED ORDER — HEPARIN SODIUM (PORCINE) 1000 UNIT/ML IJ SOLN
INTRAMUSCULAR | Status: AC
  Filled 2024-05-18: qty 5

## 2024-05-18 MED ORDER — APIXABAN 2.5 MG PO TABS
2.5000 mg | ORAL_TABLET | Freq: Two times a day (BID) | ORAL | Status: DC
  Administered 2024-05-18 – 2024-05-23 (×11): 2.5 mg via ORAL
  Filled 2024-05-18 (×11): qty 1

## 2024-05-18 MED ORDER — HYDRALAZINE HCL 25 MG PO TABS
25.0000 mg | ORAL_TABLET | Freq: Three times a day (TID) | ORAL | Status: DC
  Administered 2024-05-18 – 2024-05-21 (×8): 25 mg via ORAL
  Filled 2024-05-18 (×8): qty 1

## 2024-05-18 MED ORDER — ONDANSETRON HCL 4 MG/2ML IJ SOLN: 4.0000 mg | Freq: Three times a day (TID) | INTRAMUSCULAR | Status: DC | PRN

## 2024-05-18 MED ORDER — AMLODIPINE BESYLATE 5 MG PO TABS
5.0000 mg | ORAL_TABLET | Freq: Every day | ORAL | Status: DC
  Administered 2024-05-19 – 2024-05-21 (×3): 5 mg via ORAL
  Filled 2024-05-18 (×3): qty 1

## 2024-05-18 MED ORDER — VITAMIN B-12 1000 MCG PO TABS
500.0000 ug | ORAL_TABLET | Freq: Every day | ORAL | Status: DC
  Administered 2024-05-18 – 2024-05-23 (×6): 500 ug via ORAL
  Filled 2024-05-18 (×2): qty 1
  Filled 2024-05-18: qty 0.5
  Filled 2024-05-18 (×2): qty 1
  Filled 2024-05-18: qty 0.5

## 2024-05-18 MED ORDER — ONDANSETRON HCL 4 MG/2ML IJ SOLN: 4.0000 mg | Freq: Four times a day (QID) | INTRAMUSCULAR | Status: DC | PRN

## 2024-05-18 MED ORDER — ROSUVASTATIN CALCIUM 10 MG PO TABS
40.0000 mg | ORAL_TABLET | Freq: Every day | ORAL | Status: DC
  Administered 2024-05-19 – 2024-05-23 (×5): 40 mg via ORAL
  Filled 2024-05-18 (×4): qty 4
  Filled 2024-05-18: qty 2

## 2024-05-18 MED ORDER — FERROUS SULFATE 325 (65 FE) MG PO TABS
325.0000 mg | ORAL_TABLET | Freq: Every day | ORAL | Status: DC
  Administered 2024-05-19 – 2024-05-22 (×4): 325 mg via ORAL
  Filled 2024-05-18 (×4): qty 1

## 2024-05-18 MED ORDER — ALLOPURINOL 100 MG PO TABS
100.0000 mg | ORAL_TABLET | Freq: Every day | ORAL | Status: DC
  Administered 2024-05-19 – 2024-05-23 (×5): 100 mg via ORAL
  Filled 2024-05-18 (×5): qty 1

## 2024-05-18 MED ORDER — INSULIN ASPART 100 UNIT/ML IJ SOLN
0.0000 [IU] | Freq: Three times a day (TID) | INTRAMUSCULAR | Status: DC
  Administered 2024-05-18: 2 [IU] via SUBCUTANEOUS
  Filled 2024-05-18: qty 1
  Filled 2024-05-18: qty 2
  Filled 2024-05-18: qty 1

## 2024-05-18 MED ORDER — POLYETHYLENE GLYCOL 3350 17 G PO PACK: 17.0000 g | PACK | Freq: Every day | ORAL | Status: DC | PRN

## 2024-05-18 MED ORDER — IPRATROPIUM-ALBUTEROL 0.5-2.5 (3) MG/3ML IN SOLN
6.0000 mL | Freq: Once | RESPIRATORY_TRACT | Status: AC
  Administered 2024-05-18: 6 mL via RESPIRATORY_TRACT
  Filled 2024-05-18: qty 6

## 2024-05-18 MED ORDER — FUROSEMIDE 10 MG/ML IJ SOLN
40.0000 mg | Freq: Once | INTRAMUSCULAR | Status: AC
  Administered 2024-05-18: 40 mg via INTRAVENOUS
  Filled 2024-05-18: qty 4

## 2024-05-18 MED ORDER — MONTELUKAST SODIUM 10 MG PO TABS
10.0000 mg | ORAL_TABLET | Freq: Every day | ORAL | Status: DC
  Administered 2024-05-18 – 2024-05-22 (×5): 10 mg via ORAL
  Filled 2024-05-18 (×5): qty 1

## 2024-05-18 MED ORDER — ALBUTEROL SULFATE (2.5 MG/3ML) 0.083% IN NEBU
2.5000 mg | INHALATION_SOLUTION | RESPIRATORY_TRACT | Status: AC | PRN
  Administered 2024-05-18: 2.5 mg via RESPIRATORY_TRACT
  Filled 2024-05-18 (×2): qty 3

## 2024-05-18 MED ORDER — METOPROLOL TARTRATE 25 MG PO TABS
25.0000 mg | ORAL_TABLET | Freq: Two times a day (BID) | ORAL | Status: DC
  Administered 2024-05-18 – 2024-05-22 (×6): 25 mg via ORAL
  Filled 2024-05-18 (×8): qty 1

## 2024-05-18 MED ORDER — HYDROMORPHONE HCL 1 MG/ML IJ SOLN: 0.5000 mg | INTRAMUSCULAR | Status: AC | PRN

## 2024-05-18 MED ORDER — CHLORHEXIDINE GLUCONATE CLOTH 2 % EX PADS
6.0000 | MEDICATED_PAD | Freq: Every day | CUTANEOUS | Status: DC
  Administered 2024-05-23: 6 via TOPICAL
  Filled 2024-05-18 (×4): qty 6

## 2024-05-18 MED ORDER — METHYLPREDNISOLONE SODIUM SUCC 125 MG IJ SOLR
125.0000 mg | Freq: Once | INTRAMUSCULAR | Status: AC
  Administered 2024-05-18: 125 mg via INTRAVENOUS
  Filled 2024-05-18: qty 2

## 2024-05-18 MED ORDER — LEVOTHYROXINE SODIUM 50 MCG PO TABS
150.0000 ug | ORAL_TABLET | Freq: Every morning | ORAL | Status: DC
  Administered 2024-05-19 – 2024-05-23 (×5): 150 ug via ORAL
  Filled 2024-05-18 (×5): qty 1

## 2024-05-18 MED ORDER — ACETAMINOPHEN 325 MG PO TABS
650.0000 mg | ORAL_TABLET | Freq: Four times a day (QID) | ORAL | Status: DC | PRN
  Administered 2024-05-23: 650 mg via ORAL
  Filled 2024-05-18: qty 2

## 2024-05-18 MED ORDER — INSULIN ASPART 100 UNIT/ML IJ SOLN
0.0000 [IU] | Freq: Every day | INTRAMUSCULAR | Status: DC
  Filled 2024-05-18: qty 1

## 2024-05-18 MED ORDER — ENOXAPARIN SODIUM 40 MG/0.4ML IJ SOSY: 40.0000 mg | PREFILLED_SYRINGE | INTRAMUSCULAR | Status: DC

## 2024-05-18 MED ORDER — ACETAMINOPHEN 650 MG RE SUPP: 650.0000 mg | Freq: Four times a day (QID) | RECTAL | Status: DC | PRN

## 2024-05-18 MED ORDER — CHLORHEXIDINE GLUCONATE CLOTH 2 % EX PADS
6.0000 | MEDICATED_PAD | Freq: Every day | CUTANEOUS | Status: DC
  Administered 2024-05-20 – 2024-05-22 (×3): 6 via TOPICAL
  Filled 2024-05-18 (×3): qty 6

## 2024-05-18 MED ORDER — ONDANSETRON HCL 4 MG PO TABS: 4.0000 mg | ORAL_TABLET | Freq: Four times a day (QID) | ORAL | Status: DC | PRN

## 2024-05-18 MED ORDER — FUROSEMIDE 10 MG/ML IJ SOLN
40.0000 mg | Freq: Two times a day (BID) | INTRAMUSCULAR | Status: DC
  Administered 2024-05-18 – 2024-05-20 (×5): 40 mg via INTRAVENOUS
  Filled 2024-05-18 (×5): qty 4

## 2024-05-18 MED ORDER — SERTRALINE HCL 50 MG PO TABS
100.0000 mg | ORAL_TABLET | Freq: Every day | ORAL | Status: DC
  Administered 2024-05-18 – 2024-05-23 (×6): 100 mg via ORAL
  Filled 2024-05-18 (×6): qty 2

## 2024-05-18 NOTE — ED Notes (Signed)
 Pt O2 increased to 5L while laying flat for US . O2 was 88% increasing to 92% on 5L.

## 2024-05-18 NOTE — Progress Notes (Signed)
 Hemodialysis Note:  Received patient in bed. Alert and oriented. Informed consent singed and in chart.  Treatment initiated: 1332 Treatment completed: 1527  Access used: left internal jugular catheter Access issues: None  Rinsed back 12 minutes early due to leg cramps, 300 ml saline given as bolus. Nephrologist made aware. Report given to patient's RN.  Total UF removed: 2.1 Liters Medications given: None  Post HD weight: unable to get weight, bed scale not working  [name]

## 2024-05-18 NOTE — ED Notes (Signed)
Pt was assisted to the bathroom. 

## 2024-05-18 NOTE — Progress Notes (Signed)
 Referring Provider: No ref. provider found Primary Care Physician:  Ostwalt, Janna, PA-C Primary Nephrologist:  Dr.   [place] for Consultation: ESRD  HPI: 83 year old female with history of hypertension, coronary artery disease, congestive heart failure, COPD, obesity, obstructive sleep apnea on CPAP, end-stage renal disease on hemodialysis on Monday Wednesday Friday schedule now comes in with history of worsening shortness of breath with chest x-ray showing congestion.  She is presently on BiPAP.  Past Medical History:  Diagnosis Date   Actinic keratosis    Albuminuria    Allergy    Anemia    Arthritis    Asthma    Basal cell carcinoma 04/12/2017   Above right lateral brow. Nodulocystic type. EDC   Cancer (HCC)    skin   Cataract cortical, senile    CHF (congestive heart failure) (HCC)    COPD (chronic obstructive pulmonary disease) (HCC)    Diabetes mellitus without complication (HCC)    GERD (gastroesophageal reflux disease)    Hemorrhoids    History of kidney stones    Hyperlipidemia    Hypertension    Hypothyroidism    Lyme disease    No kidney function    OSA (obstructive sleep apnea)    Osteoporosis    Osteoporosis    Oxygen deficiency July 2025   Reflux esophagitis    Sleep apnea    Steatohepatitis    Steatohepatitis     Past Surgical History:  Procedure Laterality Date   A/V FISTULAGRAM N/A 12/31/2023   Procedure: A/V Fistulagram;  Surgeon: Marea Selinda RAMAN, MD;  Location: [facility] INVASIVE CV LAB;  Service: Cardiovascular;  Laterality: N/A;   ABDOMINAL HYSTERECTOMY     APPENDECTOMY     AV FISTULA PLACEMENT Left 05/19/2022   Procedure: ARTERIOVENOUS (AV) FISTULA CREATION ( BRACHIAL CEPHALIC );  Surgeon: Jama Cordella MATSU, MD;  Location: [facility] ORS;  Service: Vascular;  Laterality: Left;   CARDIAC CATHETERIZATION  1980   [facility]   CARDIAC CATHETERIZATION  08/13/2014   [facility]. no significant CAD, normal LVEDP.    CATARACT EXTRACTION     CHOLECYSTECTOMY     COLONOSCOPY      COLONOSCOPY WITH PROPOFOL  N/A 12/07/2016   Procedure: COLONOSCOPY WITH PROPOFOL ;  Surgeon: Gladis RAYMOND Mariner, MD;  Location: [facility] ENDOSCOPY;  Service: Endoscopy;  Laterality: N/A;   DIALYSIS/PERMA CATHETER INSERTION N/A 12/31/2023   Procedure: DIALYSIS/PERMA CATHETER INSERTION;  Surgeon: Marea Selinda RAMAN, MD;  Location: [facility] INVASIVE CV LAB;  Service: Cardiovascular;  Laterality: N/A;   ESOPHAGOGASTRODUODENOSCOPY     ESOPHAGOGASTRODUODENOSCOPY (EGD) WITH PROPOFOL  N/A 12/07/2016   Procedure: ESOPHAGOGASTRODUODENOSCOPY (EGD) WITH PROPOFOL ;  Surgeon: Gladis RAYMOND Mariner, MD;  Location: [facility] ENDOSCOPY;  Service: Endoscopy;  Laterality: N/A;   ESOPHAGOGASTRODUODENOSCOPY (EGD) WITH PROPOFOL  N/A 01/07/2018   Procedure: ESOPHAGOGASTRODUODENOSCOPY (EGD) WITH PROPOFOL ;  Surgeon: Mariner Gladis RAYMOND, MD;  Location[facility] ENDOSCOPY;  Service: Endoscopy;  Laterality: N/A;   EYE SURGERY     HEMATOMA EVACUATION Right 08/02/2023   Procedure: EVACUATION HEMATOMA;  Surgeon: Jordis Laneta FALCON, MD;  Location: [facility] ORS;  Service: General;  Laterality: Right;   HEMORRHOID SURGERY     PARTIAL HYSTERECTOMY     THYROIDECTOMY N/A 08/25/2019   Procedure: THYROIDECTOMY EXTRACTION OF SUBTOTAL COMPONENT; PARATHYROID  AUTOTRANSPLANT X1;  Surgeon: Marolyn Nest, MD;  Location: [facility] ORS;  Service: General;  Laterality: N/A;  With Nerve Monitoring(RLN)   TONSILLECTOMY      Prior to Admission medications   Medication Sig Start Date End Date Taking? Authorizing Provider  acetaminophen  (TYLENOL ) 500  MG tablet Take 1,000 mg by mouth every 6 (six) hours as needed for mild pain or moderate pain.    [provider]  albuterol  (VENTOLIN  HFA) 108 (90 Base) MCG/ACT inhaler Inhale 2 puffs into the lungs every 6 (six) hours as needed for wheezing or shortness of breath. 11/14/23   Kasa, Kurian, MD  allopurinol  (ZYLOPRIM ) 100 MG tablet Take 100 mg by mouth daily. 09/17/23   [provider]  amLODipine  (NORVASC ) 5 MG tablet Take 1  tablet (5 mg total) by mouth daily. 08/06/23 08/05/24  Alexander, Natalie, DO  apixaban  (ELIQUIS ) 2.5 MG TABS tablet Take 1 tablet (2.5 mg total) by mouth 2 (two) times daily. 06/04/23 06/03/24  Von Bellis, MD  azelastine  (ASTELIN ) 0.1 % nasal spray Place 2 sprays into both nostrils 2 (two) times daily. Use in each nostril as directed 10/02/23   Tobie Eldora NOVAK, MD  epoetin  alfa-epbx (RETACRIT ) 4000 UNIT/ML injection Inject 1 mL (4,000 Units total) into the vein every Monday, Wednesday, and Friday at 6 PM. 01/02/24   Awanda City, MD  esomeprazole  (NEXIUM ) 40 MG capsule Take 1 capsule (40 mg total) by mouth daily before breakfast. 11/27/23   Agrawal, Kavita, MD  ferrous sulfate  325 (65 FE) MG tablet Take 325 mg by mouth daily with breakfast.    [provider]  fluticasone  (FLONASE ) 50 MCG/ACT nasal spray Place 2 sprays into both nostrils daily. 10/02/23   Tobie Eldora NOVAK, MD  fluticasone  furoate-vilanterol (BREO ELLIPTA ) 200-25 MCG/ACT AEPB Inhale 1 puff into the lungs daily. 11/14/23   Kasa, Kurian, MD  folic acid  (FOLVITE ) 1 MG tablet Take 1 tablet (1 mg total) by mouth daily. 01/01/24   Awanda City, MD  hydrALAZINE  (APRESOLINE ) 25 MG tablet Take 1 tablet (25 mg total) by mouth 3 (three) times daily. 05/01/24   Jens Durand, MD  ipratropium-albuterol  (DUONEB) 0.5-2.5 (3) MG/3ML SOLN INHALE THE CONTENTS OF ONE (1) VIAL VIA NEBULIZATION EVERY 6 HOURS AS NEEDED FORSHORTNESS OF BREATH 04/23/24   Kasa, Kurian, MD  levothyroxine  (SYNTHROID ) 150 MCG tablet Take 150 mcg by mouth every morning. 02/25/24   [provider]  lidocaine -prilocaine  (EMLA ) cream as directed. Before dialysis 01/02/24   [provider]  loperamide  (IMODIUM ) 2 MG capsule Take 1 capsule (2 mg total) by mouth as needed for diarrhea or loose stools. 08/05/22   Amin, Sumayya, MD  metoprolol  tartrate (LOPRESSOR ) 50 MG tablet Take 0.5 tablets (25 mg total) by mouth 2 (two) times daily. 05/01/24   Jens Durand, MD  montelukast   (SINGULAIR ) 10 MG tablet Take 1 tablet (10 mg total) by mouth at bedtime. 01/01/24   Awanda City, MD  Multiple Minerals-Vitamins (CALCIUM  & VIT D3 BONE HEALTH PO) Take 1 tablet by mouth daily. 600 mg/ 25 mg    [provider]  nystatin powder Apply 1 Application topically 3 (three) times daily.    [provider]  rosuvastatin  (CRESTOR ) 40 MG tablet Take 40 mg by mouth daily.    [provider]  senna-docusate (SENOKOT-S) 8.6-50 MG tablet Take 2 tablets by mouth at bedtime as needed for mild constipation. 08/06/23   Alexander, Natalie, DO  sertraline  (ZOLOFT ) 100 MG tablet TAKE 1 TABLET BY MOUTH DAILY 05/13/24   Ostwalt, Janna, PA-C  torsemide  (DEMADEX ) 20 MG tablet Take 40 mg by mouth daily.    [provider]  vitamin B-12 (CYANOCOBALAMIN ) 500 MCG tablet Take 500 mcg by mouth daily.    [provider]    Current Facility-Administered Medications  Medication Dose Route Frequency Provider Last Rate Last Admin   acetaminophen  (TYLENOL ) tablet 650 mg  650 mg Oral Q6H PRN Roann Gouty, MD       Or   acetaminophen  (TYLENOL ) suppository 650 mg  650 mg Rectal Q6H PRN Paudel, Keshab, MD       albuterol  (PROVENTIL ) (2.5 MG/3ML) 0.083% nebulizer solution 2.5 mg  2.5 mg Nebulization Q4H PRN Duncan, Hazel V, MD   2.5 mg at 05/18/24 1019   allopurinol  (ZYLOPRIM ) tablet 100 mg  100 mg Oral Daily Paudel, Gouty, MD       amLODipine  (NORVASC ) tablet 5 mg  5 mg Oral Daily Paudel, Gouty, MD       apixaban  (ELIQUIS ) tablet 2.5 mg  2.5 mg Oral BID Paudel, Keshab, MD       cyanocobalamin  (VITAMIN B12) tablet 500 mcg  500 mcg Oral Daily Paudel, Keshab, MD       ferrous sulfate  tablet 325 mg  325 mg Oral Q breakfast Paudel, Keshab, MD       furosemide  (LASIX ) injection 40 mg  40 mg Intravenous BID Paudel, Keshab, MD       hydrALAZINE  (APRESOLINE ) tablet 25 mg  25 mg Oral TID Paudel, Keshab, MD       HYDROmorphone  (DILAUDID ) injection 0.5 mg  0.5 mg Intravenous Q4H PRN  Cleatus Delayne GAILS, MD       insulin  aspart (novoLOG ) injection 0-5 Units  0-5 Units Subcutaneous QHS Paudel, Gouty, MD       insulin  aspart (novoLOG ) injection 0-9 Units  0-9 Units Subcutaneous TID WC Paudel, Keshab, MD       levothyroxine  (SYNTHROID ) tablet 150 mcg  150 mcg Oral q morning Paudel, Keshab, MD       metoprolol  tartrate (LOPRESSOR ) tablet 25 mg  25 mg Oral BID Paudel, Keshab, MD       montelukast  (SINGULAIR ) tablet 10 mg  10 mg Oral QHS Paudel, Gouty, MD       ondansetron  (ZOFRAN ) tablet 4 mg  4 mg Oral Q6H PRN Paudel, Keshab, MD       Or   ondansetron  (ZOFRAN ) injection 4 mg  4 mg Intravenous Q6H PRN Paudel, Keshab, MD       polyethylene glycol (MIRALAX  / GLYCOLAX ) packet 17 g  17 g Oral Daily PRN Paudel, Keshab, MD       rosuvastatin  (CRESTOR ) tablet 40 mg  40 mg Oral Daily Paudel, Gouty, MD       sertraline  (ZOLOFT ) tablet 100 mg  100 mg Oral Daily Paudel, Gouty, MD       Current Outpatient Medications  Medication Sig Dispense Refill   acetaminophen  (TYLENOL ) 500 MG tablet Take 1,000 mg by mouth every 6 (six) hours as needed for mild pain or moderate pain.     albuterol  (VENTOLIN  HFA) 108 (90 Base) MCG/ACT inhaler Inhale 2 puffs into the lungs every 6 (six) hours as needed for wheezing or shortness of breath. 18 g 3   allopurinol  (ZYLOPRIM ) 100 MG tablet Take 100 mg by mouth daily.     amLODipine  (NORVASC ) 5 MG tablet Take 1 tablet (5 mg total) by mouth daily.     apixaban  (ELIQUIS ) 2.5 MG TABS tablet Take 1 tablet (2.5 mg total) by mouth 2 (two) times daily. 60 tablet 11   azelastine  (ASTELIN ) 0.1 % nasal spray Place 2 sprays into both nostrils 2 (two) times daily. Use in each nostril as directed 30 mL 12   epoetin  alfa-epbx (RETACRIT ) 4000 UNIT/ML injection Inject  1 mL (4,000 Units total) into the vein every Monday, Wednesday, and Friday at 6 PM.     esomeprazole  (NEXIUM ) 40 MG capsule Take 1 capsule (40 mg total) by mouth daily before breakfast. 30 capsule 2   ferrous  sulfate 325 (65 FE) MG tablet Take 325 mg by mouth daily with breakfast.     fluticasone  (FLONASE ) 50 MCG/ACT nasal spray Place 2 sprays into both nostrils daily. 11 mL 5   fluticasone  furoate-vilanterol (BREO ELLIPTA ) 200-25 MCG/ACT AEPB Inhale 1 puff into the lungs daily. 30 each 5   folic acid  (FOLVITE ) 1 MG tablet Take 1 tablet (1 mg total) by mouth daily.     hydrALAZINE  (APRESOLINE ) 25 MG tablet Take 1 tablet (25 mg total) by mouth 3 (three) times daily. 90 tablet 0   ipratropium-albuterol  (DUONEB) 0.5-2.5 (3) MG/3ML SOLN INHALE THE CONTENTS OF ONE (1) VIAL VIA NEBULIZATION EVERY 6 HOURS AS NEEDED FORSHORTNESS OF BREATH 450 mL 6   levothyroxine  (SYNTHROID ) 150 MCG tablet Take 150 mcg by mouth every morning.     lidocaine -prilocaine  (EMLA ) cream as directed. Before dialysis     loperamide  (IMODIUM ) 2 MG capsule Take 1 capsule (2 mg total) by mouth as needed for diarrhea or loose stools. 30 capsule 0   metoprolol  tartrate (LOPRESSOR ) 50 MG tablet Take 0.5 tablets (25 mg total) by mouth 2 (two) times daily.     montelukast  (SINGULAIR ) 10 MG tablet Take 1 tablet (10 mg total) by mouth at bedtime. 30 tablet 2   Multiple Minerals-Vitamins (CALCIUM  & VIT D3 BONE HEALTH PO) Take 1 tablet by mouth daily. 600 mg/ 25 mg     nystatin powder Apply 1 Application topically 3 (three) times daily.     rosuvastatin  (CRESTOR ) 40 MG tablet Take 40 mg by mouth daily.     senna-docusate (SENOKOT-S) 8.6-50 MG tablet Take 2 tablets by mouth at bedtime as needed for mild constipation.     sertraline  (ZOLOFT ) 100 MG tablet TAKE 1 TABLET BY MOUTH DAILY 90 tablet 0   torsemide  (DEMADEX ) 20 MG tablet Take 40 mg by mouth daily.     vitamin B-12 (CYANOCOBALAMIN ) 500 MCG tablet Take 500 mcg by mouth daily.      Allergies as of 05/18/2024 - Review Complete 05/18/2024  Allergen Reaction Noted   Ace inhibitors  07/24/2014   Egg-derived products Diarrhea 12/06/2016   Other  07/24/2014   Prednisone   07/24/2014    Risedronate  07/24/2014   Sulfa antibiotics Itching and Swelling 07/24/2014   Sulfasalazine Other (See Comments) 12/06/2016    Family History  Problem Relation Age of Onset   Cancer Mother        breast   Kidney disease Mother    Breast cancer Mother    Diabetes Mother    Heart disease Mother    Hypertension Mother    Hypertension Father    Heart attack Father    Cancer Father    COPD Father    Arthritis Maternal Grandfather    Kidney disease Maternal Uncle     Social History   Socioeconomic History   Marital status: Married    Spouse name: Not on file   Number of children: Not on file   Years of education: Not on file   Highest education level: Associate degree: academic program  Occupational History   Not on file  Tobacco Use   Smoking status: Never   Smokeless tobacco: Never  Vaping Use   Vaping status: Never Used  Substance and Sexual Activity   Alcohol use: No   Drug use: No   Sexual activity: Not Currently    Birth control/protection: None  Other Topics Concern   Not on file  Social History Narrative   Lives at home with husband .   Social Drivers of Health   Financial Resource Strain: Medium Risk (04/14/2024)   [facility]   Overall Financial Resource Strain (CARDIA)    Difficulty of Paying Living Expenses: Somewhat hard  Food Insecurity: No Food Insecurity (04/25/2024)   Hunger Vital Sign    Worried About Running Out of Food in the Last Year: Never true    Ran Out of Food in the Last Year: Never true  Transportation Needs: Unmet Transportation Needs (04/25/2024)   PRAPARE - Transportation    Lack of Transportation (Medical): Yes    Lack of Transportation (Non-Medical): Yes  Physical Activity: Insufficiently Active (06/12/2023)   Exercise Vital Sign    Days of Exercise per Week: 7 days    Minutes of Exercise per Session: 20 min  Stress: No Stress Concern Present (06/12/2023)   [employer] of Occupational Health  - Occupational Stress Questionnaire    Feeling of Stress : Only a little  Social Connections: Moderately Isolated (04/25/2024)   Social Connection and Isolation Panel    Frequency of Communication with Friends and Family: More than three times a week    Frequency of Social Gatherings with Friends and Family: More than three times a week    Attends Religious Services: Never    Database administrator or Organizations: No    Attends Banker Meetings: Never    Marital Status: Married  Catering manager Violence: Not At Risk (04/25/2024)   Humiliation, Afraid, Rape, and Kick questionnaire    Fear of Current or Ex-Partner: No    Emotionally Abused: No    Physically Abused: No    Sexually Abused: No    Physical Exam: Vital signs in last 24 hours: Temp:  [97.9 F (36.6 C)] 97.9 F (36.6 C) (08/17 0449) Pulse Rate:  [52-81] 78 (08/17 1120) Resp:  [19-34] 34 (08/17 1120) BP: (144-177)/(46-66) 156/57 (08/17 1100) SpO2:  [92 %-100 %] 96 % (08/17 1120) FiO2 (%):  [35 %-60 %] 35 % (08/17 1124) Weight:  [75.4 kg] 75.4 kg (08/17 0450)   General:   Alert,  Well-developed, well-nourished, pleasant and cooperative in NAD Head:  Normocephalic and atraumatic. Eyes:  Sclera clear, no icterus.   Conjunctiva pink. Ears:  Normal auditory acuity. Nose:  No deformity, discharge,  or lesions. Lungs:  Clear throughout to auscultation.   No wheezes, crackles, or rhonchi. No acute distress. Heart:  Regular rate and rhythm; no murmurs, clicks, rubs,  or gallops. Abdomen:  Soft, nontender and nondistended. No masses, hepatosplenomegaly or hernias noted. Normal bowel sounds, without guarding, and without rebound.   Extremities:  Without clubbing or edema.  Intake/Output from previous day: No intake/output data recorded. Intake/Output this shift: No intake/output data recorded.  Lab Results: Recent Labs    05/18/24 0500  WBC 12.0*  HGB 10.5*  HCT 35.0*  PLT 202   BMET Recent Labs     05/18/24 0500  NA 140  K 4.2  CL 103  CO2 25  GLUCOSE 183*  BUN 36*  CREATININE 3.16*  CALCIUM  9.3   LFT Recent Labs    05/18/24 0500  PROT 7.6  ALBUMIN 4.0  AST 38  ALT 22  ALKPHOS 108  BILITOT 1.0   PT/INR No results for input(s): LABPROT, INR in the last 72 hours. Hepatitis Panel No results for input(s): HEPBSAG, HCVAB, HEPAIGM, HEPBIGM in the last 72 hours.  Studies/Results: DG Chest Portable 1 View Result Date: 05/18/2024 EXAM: 1 VIEW XRAY OF THE CHEST 05/18/2024 05:00:00 AM COMPARISON: None available. CLINICAL HISTORY: Shortness of breath. To ER from home via EMS after waking up in respiratory distress. Hypoxia FINDINGS: LUNGS AND PLEURA: Increased and diffuse interstitial edema is present. Small effusions are suspected. No significant airspace consolidation is present. HEART AND MEDIASTINUM: Cardiomegaly is again noted. Atherosclerotic changes are present at the aortic arch. BONES AND SOFT TISSUES: A left IJ dialysis catheter is stable. IMPRESSION: 1. Increased and diffuse interstitial edema with suspected small effusions. No significant airspace consolidation. 2. Cardiomegaly and atherosclerotic changes at the aortic arch. Electronically signed by: Lonni Necessary MD 05/18/2024 05:26 AM EDT RP Workstation: [id number]    Assessment/Plan:  83 year old female with history of hypertension, coronary artery disease, congestive heart failure, COPD, obesity, obstructive sleep apnea on CPAP, end-stage renal disease on hemodialysis on Monday Wednesday Friday schedule now comes in with history of worsening shortness of breath with chest x-ray showing congestion.  She is presently on BiPAP.  ESRD: Patient has been on Monday Wednesday Friday schedule.  Will dialyze her for 2 hours with 2 to 3 L of fluid removal as tolerated today.  Will dialyze again tomorrow on a regular schedule.  ANEMIA: Will continue to monitor hemoglobin levels.  Continue Epogen  and iron.  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  HTN/VOL: Will continue the diuretic orally.  Continue antihypertensives as ordered.  Fluid restriction advised.  Labs and medications reviewed. Will continue to monitor closely.    LOS: 0 Pinkey Edman, MD [facility] @TODAY @11 :45 AM

## 2024-05-18 NOTE — ED Notes (Signed)
 Pt placed on 2L NC

## 2024-05-18 NOTE — ED Notes (Signed)
 Pt asking for neb treatment, neb provided

## 2024-05-18 NOTE — ED Notes (Signed)
Pt given cup of water and warm blanket ? ?

## 2024-05-18 NOTE — ED Notes (Signed)
 Pt up to bedside commode. Pt placed on 4L [place].

## 2024-05-18 NOTE — ED Notes (Signed)
 US  at bedside.

## 2024-05-18 NOTE — ED Notes (Addendum)
 Pt up to use restroom, brief changed. Pt placed back in bed. O2 at 96% on 4L [place].

## 2024-05-18 NOTE — ED Notes (Signed)
 Pt stating her IV is bleeding. IV sites cleaned and dressings changed. All IV's flushing well.

## 2024-05-18 NOTE — ED Notes (Addendum)
 Pt stating IV bleeding again. IV removed by this RN. Posterior IV dressing changed. Pt up to use restroom, gown and brief changed. Pt back in bed connected to monitor.

## 2024-05-18 NOTE — ED Notes (Signed)
 Pt stating feels harder to breathe. Pt placed back on CPAP.

## 2024-05-18 NOTE — ED Provider Notes (Signed)
 [facility] Provider Note   Event Date/Time   First MD Initiated Contact with Patient 05/18/24 0451     (approximate) History  Respiratory Distress  HPI [name] is a 83 y.o. female with a past medical history of COPD, CHF, and end-stage renal disease on dialysis Monday/Wednesday/Friday who presents via EMS complaining of worsening shortness of breath over the last 24 hours.  Patient states that she has had to increase her home oxygen however EMS found patient to be 85% with minimal improvement after 6 L nasal cannula.  Patient then placed on CPAP during transport with adequate oxygenation in the mid 90s.  Patient placed on BiPAP on arrival and states that she has not missed any dialysis sessions and has been using her medications on time and as prescribed.  Patient denies any recent travel or sick contacts. ROS: Patient currently denies any vision changes, tinnitus, difficulty speaking, facial droop, sore throat, chest pain, abdominal pain, nausea/vomiting/diarrhea, dysuria, or weakness/numbness/paresthesias in any extremity   Physical Exam  Triage Vital Signs: ED Triage Vitals  Encounter Vitals Group     BP --      [name] --      [name] --      [name] --      [name] --      Pulse Rate 05/18/24 0449 77     Resp 05/18/24 0449 (!) 30     Temp --      Temp src --      SpO2 05/18/24 0449 100 %     Weight 05/18/24 0450 166 lb 4.8 oz (75.4 kg)     Height 05/18/24 0450 5' 2 (1.575 m)     Head Circumference --      Peak Flow --      Pain Score --      Pain Loc --      Pain Education --      Exclude from Growth Chart --    Most recent vital signs: Vitals:   05/18/24 0630 05/18/24 0700  BP: (!) 146/51 (!) 146/52  Pulse: (!) 56 (!) 55  Resp: 19 20  Temp:    SpO2: 100% 100%   General: Awake, oriented x4. CV:  Good peripheral perfusion. Resp:  Increased effort.   BiPAP in place.  Inspiratory and expiratory wheezing over bilateral lung fields.  Rales over bilateral lung fields Abd:  No distention. Other:  Elderly obese Caucasian female resting comfortably in no acute distress ED Results / Procedures / Treatments  Labs (all labs ordered are listed, but only abnormal results are displayed) Labs Reviewed  CBC - Abnormal; Notable for the following components:      Result Value   WBC 12.0 (*)    RBC 3.64 (*)    Hemoglobin 10.5 (*)    HCT 35.0 (*)    RDW 16.0 (*)    All other components within normal limits  COMPREHENSIVE METABOLIC PANEL WITH GFR - Abnormal; Notable for the following components:   Glucose, Bld 183 (*)    BUN 36 (*)    Creatinine, Ser 3.16 (*)    GFR, Estimated 14 (*)    All other components within normal limits  BRAIN NATRIURETIC PEPTIDE - Abnormal; Notable for the following components:   B Natriuretic Peptide 2,692.7 (*)    All other components within normal limits  BLOOD GAS, VENOUS - Abnormal; Notable for the following  components:   pCO2, Ven 62 (*)    pO2, Ven 54 (*)    Bicarbonate 31.2 (*)    Acid-Base Excess 3.2 (*)    All other components within normal limits  TROPONIN I (HIGH SENSITIVITY) - Abnormal; Notable for the following components:   Troponin I (High Sensitivity) 32 (*)    All other components within normal limits  RESP PANEL BY RT-PCR (RSV, FLU A&B, COVID)  [id number]  TROPONIN I (HIGH SENSITIVITY)   EKG ED ECG REPORT I, Artist MARLA Kerns, the attending physician, personally viewed and interpreted this ECG. Date: 05/18/2024 EKG Time: 0503 Rate: 56 Rhythm: normal sinus rhythm QRS Axis: normal Intervals: normal ST/T Wave abnormalities: normal Narrative Interpretation: no evidence of acute ischemia RADIOLOGY ED MD interpretation: Single view portable chest x-ray shows increase in diffuse interstitial edema with suspected small pleural effusions as well as cardiomegaly - All radiology independently interpreted  and agree with radiology assessment Official radiology report(s): DG Chest Portable 1 View Result Date: 05/18/2024 EXAM: 1 VIEW XRAY OF THE CHEST 05/18/2024 05:00:00 AM COMPARISON: None available. CLINICAL HISTORY: Shortness of breath. To ER from home via EMS after waking up in respiratory distress. Hypoxia FINDINGS: LUNGS AND PLEURA: Increased and diffuse interstitial edema is present. Small effusions are suspected. No significant airspace consolidation is present. HEART AND MEDIASTINUM: Cardiomegaly is again noted. Atherosclerotic changes are present at the aortic arch. BONES AND SOFT TISSUES: A left IJ dialysis catheter is stable. IMPRESSION: 1. Increased and diffuse interstitial edema with suspected small effusions. No significant airspace consolidation. 2. Cardiomegaly and atherosclerotic changes at the aortic arch. Electronically signed by: Lonni Necessary MD 05/18/2024 05:26 AM EDT RP Workstation: [id number]   PROCEDURES: Critical Care performed: Yes, see critical care procedure note(s) Procedures CRITICAL CARE Performed by: Manasi Dishon K Tay Whitwell  Total critical care time: 35 minutes  Critical care time was exclusive of separately billable procedures and treating other patients.  Critical care was necessary to treat or prevent imminent or life-threatening deterioration.  Critical care was time spent personally by me on the following activities: development of treatment plan with patient and/or surrogate as well as nursing, discussions with consultants, evaluation of patient's response to treatment, examination of patient, obtaining history from patient or surrogate, ordering and performing treatments and interventions, ordering and review of laboratory studies, ordering and review of radiographic studies, pulse oximetry and re-evaluation of patient's condition.  MEDICATIONS ORDERED IN ED: Medications  ondansetron  (ZOFRAN ) injection 4 mg (has no administration in time range)  HYDROmorphone   (DILAUDID ) injection 0.5 mg (has no administration in time range)  albuterol  (PROVENTIL ) (2.5 MG/3ML) 0.083% nebulizer solution 2.5 mg (has no administration in time range)  ipratropium-albuterol  (DUONEB) 0.5-2.5 (3) MG/3ML nebulizer solution 6 mL (6 mLs Nebulization Given 05/18/24 0520)  methylPREDNISolone  sodium succinate (SOLU-MEDROL ) 125 mg/2 mL injection 125 mg (125 mg Intravenous Given 05/18/24 0527)  furosemide  (LASIX ) injection 40 mg (40 mg Intravenous Given 05/18/24 0522)   IMPRESSION / MDM / ASSESSMENT AND PLAN / ED COURSE  I reviewed the triage vital signs and the nursing notes.                             The patient is on the cardiac monitor to evaluate for evidence of arrhythmia and/or significant heart rate changes. Patient's presentation is most consistent with acute presentation with potential threat to life or bodily function. The patient appears to be suffering from a moderate/severe exacerbation of COPD/CHF.  Based on the history, exam, CXR/EKG reviewed by me, and further workup I don't suspect any other emergent cause of this presentation, such as pneumonia, acute coronary syndrome, congestive heart failure, pulmonary embolism, or pneumothorax.  ED Interventions: Diuretics, bronchodilators, steroids, reassess  Reassessment: After treatment, the patient's shortness of breath is improving but patient is still requiring supplemental oxygenation with BiPAP  Disposition: Admit   FINAL CLINICAL IMPRESSION(S) / ED DIAGNOSES   Final diagnoses:  Acute respiratory failure with hypoxia (HCC)  COPD exacerbation (HCC)  Acute pulmonary edema (HCC)   Rx / DC Orders   ED Discharge Orders     None      Note:  This document was prepared using Dragon voice recognition software and may include unintentional dictation errors.   Jossie Artist POUR, MD 05/18/24 [phone]

## 2024-05-18 NOTE — ED Notes (Addendum)
 Pt stating starting to feel a little harder to breathe. Pt sat up more in bed at this time.

## 2024-05-18 NOTE — TOC CM/SW Note (Signed)
.  Transition of Care [facility]) - Inpatient Brief Assessment   Patient Details  Name: [name] MRN: [id number] Date of Birth: 1941-09-04  [facility] CM/SW Contact:    Edsel DELENA Fischer, LCSW Phone Number: 05/18/2024, 5:20 PM   Clinical Narrative:   SW to handoff to heart failure team to follow up with pt.  If additional needs are present, sw to address  Transition of Care Asessment:

## 2024-05-18 NOTE — ED Notes (Signed)
 Lab called to add on COVID swab that was sent with initial lab collection.

## 2024-05-18 NOTE — ED Notes (Signed)
 Pt up to use bathroom, able to stand and pivot to toilet, minimal assistance required.

## 2024-05-18 NOTE — Progress Notes (Signed)
*  PRELIMINARY RESULTS* Echocardiogram 2D Echocardiogram has been performed.  Bari BROCKS Argelio Granier 05/18/2024, 9:45 AM

## 2024-05-18 NOTE — H&P (Addendum)
 History and Physical    [name] FMW:[id number] DOB: 1941/07/15 DOA: 05/18/2024  DOS: the patient was seen and examined on 05/18/2024  PCP: Ostwalt, Janna, PA-C   Patient coming from: Home  I have personally briefly reviewed patient's old medical records in [facility]  Chief Complaint: Shortness of breath  HPI: [name] is a pleasant 83 y.o. female with medical history significant for COPD on home oxygen 3.5 L in via nasal cannula, CHF, end-stage renal disease on dialysis Monday Wednesday and Friday who presented to ED via EMS for shortness of breath for the last 24 hours.  Patient stated that she had dialysis Friday where her ultrafiltrate was not removed.  She increased her home oxygen without improvement in her saturation.  EMS found patient to be 85% with minimal improvement after 6 L of nasal cannula.  Patient was placed on CPAP during transport with adequate oxygenation and mid 90s.  Patient was placed on BiPAP on arrival to ED.  She denies any recent travel or sick contact.  She denies any fever or chills.  She denies any nausea vomiting or diarrhea.  ED Course: Upon arrival to the ED, patient is found to be hypoxemic requiring 6 L of oxygen by nasal cannula still not saturating well then changed to CPAP and BiPAP in the ED. she was also given Lasix  40 mg x 1 and Solu-Medrol  125 mg x 1.  She had leukocytosis at 12.  Chest x-ray showed interstitial effusion.  Hospitalist service was consulted for evaluation for admission for acute hypoxemic respiratory failure likely due to CHF exacerbation.  Review of Systems:  ROS  All other systems negative except as noted in the HPI.  Past Medical History:  Diagnosis Date   Actinic keratosis    Albuminuria    Allergy    Anemia    Arthritis    Asthma    Basal cell carcinoma 04/12/2017   Above right lateral brow. Nodulocystic type. EDC   Cancer (HCC)    skin   Cataract cortical, senile    CHF (congestive heart failure) (HCC)     COPD (chronic obstructive pulmonary disease) (HCC)    Diabetes mellitus without complication (HCC)    GERD (gastroesophageal reflux disease)    Hemorrhoids    History of kidney stones    Hyperlipidemia    Hypertension    Hypothyroidism    Lyme disease    No kidney function    OSA (obstructive sleep apnea)    Osteoporosis    Osteoporosis    Oxygen deficiency July 2025   Reflux esophagitis    Sleep apnea    Steatohepatitis    Steatohepatitis     Past Surgical History:  Procedure Laterality Date   A/V FISTULAGRAM N/A 12/31/2023   Procedure: A/V Fistulagram;  Surgeon: Marea Selinda RAMAN, MD;  Location: [facility] INVASIVE CV LAB;  Service: Cardiovascular;  Laterality: N/A;   ABDOMINAL HYSTERECTOMY     APPENDECTOMY     AV FISTULA PLACEMENT Left 05/19/2022   Procedure: ARTERIOVENOUS (AV) FISTULA CREATION ( BRACHIAL CEPHALIC );  Surgeon: Jama Cordella MATSU, MD;  Location: [facility] ORS;  Service: Vascular;  Laterality: Left;   CARDIAC CATHETERIZATION  1980   [facility]   CARDIAC CATHETERIZATION  08/13/2014   [facility]. no significant CAD, normal LVEDP.    CATARACT EXTRACTION     CHOLECYSTECTOMY     COLONOSCOPY     COLONOSCOPY WITH PROPOFOL  N/A 12/07/2016   Procedure: COLONOSCOPY WITH PROPOFOL ;  Surgeon: Gladis  RAYMOND Mariner, MD;  Location: [facility] ENDOSCOPY;  Service: Endoscopy;  Laterality: N/A;   DIALYSIS/PERMA CATHETER INSERTION N/A 12/31/2023   Procedure: DIALYSIS/PERMA CATHETER INSERTION;  Surgeon: Marea Selinda RAMAN, MD;  Location: [facility] INVASIVE CV LAB;  Service: Cardiovascular;  Laterality: N/A;   ESOPHAGOGASTRODUODENOSCOPY     ESOPHAGOGASTRODUODENOSCOPY (EGD) WITH PROPOFOL  N/A 12/07/2016   Procedure: ESOPHAGOGASTRODUODENOSCOPY (EGD) WITH PROPOFOL ;  Surgeon: Gladis RAYMOND Mariner, MD;  Location: [facility] ENDOSCOPY;  Service: Endoscopy;  Laterality: N/A;   ESOPHAGOGASTRODUODENOSCOPY (EGD) WITH PROPOFOL  N/A 01/07/2018   Procedure: ESOPHAGOGASTRODUODENOSCOPY (EGD) WITH PROPOFOL ;  Surgeon: Mariner Gladis RAYMOND, MD;  Location: [facility]  ENDOSCOPY;  Service: Endoscopy;  Laterality: N/A;   EYE SURGERY     HEMATOMA EVACUATION Right 08/02/2023   Procedure: EVACUATION HEMATOMA;  Surgeon: Jordis Laneta FALCON, MD;  Location: [facility] ORS;  Service: General;  Laterality: Right;   HEMORRHOID SURGERY     PARTIAL HYSTERECTOMY     THYROIDECTOMY N/A 08/25/2019   Procedure: THYROIDECTOMY EXTRACTION OF SUBTOTAL COMPONENT; PARATHYROID  AUTOTRANSPLANT X1;  Surgeon: Marolyn Nest, MD;  Location: [facility] ORS;  Service: General;  Laterality: N/A;  With Nerve Monitoring(RLN)   TONSILLECTOMY       reports that she has never smoked. She has never used smokeless tobacco. She reports that she does not drink alcohol and does not use drugs.  Allergies  Allergen Reactions   Ace Inhibitors     Other reaction(s): Unknown   Egg-Derived Products Diarrhea   Other     Other reaction(s): Other (See Comments) Eggs   Prednisone      Other reaction(s): Other (See Comments) joint pain   Risedronate     Other reaction(s): Other (See Comments)   Sulfa Antibiotics Itching and Swelling    Other reaction(s): Other (See Comments)   Sulfasalazine Other (See Comments)    Family History  Problem Relation Age of Onset   Cancer Mother        breast   Kidney disease Mother    Breast cancer Mother    Diabetes Mother    Heart disease Mother    Hypertension Mother    Hypertension Father    Heart attack Father    Cancer Father    COPD Father    Arthritis Maternal Grandfather    Kidney disease Maternal Uncle     Prior to Admission medications   Medication Sig Start Date End Date Taking? Authorizing Provider  acetaminophen  (TYLENOL ) 500 MG tablet Take 1,000 mg by mouth every 6 (six) hours as needed for mild pain or moderate pain.    [provider]  albuterol  (VENTOLIN  HFA) 108 (90 Base) MCG/ACT inhaler Inhale 2 puffs into the lungs every 6 (six) hours as needed for wheezing or shortness of breath. 11/14/23   Kasa, Kurian, MD  allopurinol  (ZYLOPRIM ) 100 MG  tablet Take 100 mg by mouth daily. 09/17/23   [provider]  amLODipine  (NORVASC ) 5 MG tablet Take 1 tablet (5 mg total) by mouth daily. 08/06/23 08/05/24  Alexander, Natalie, DO  apixaban  (ELIQUIS ) 2.5 MG TABS tablet Take 1 tablet (2.5 mg total) by mouth 2 (two) times daily. 06/04/23 06/03/24  Von Bellis, MD  azelastine  (ASTELIN ) 0.1 % nasal spray Place 2 sprays into both nostrils 2 (two) times daily. Use in each nostril as directed 10/02/23   Tobie Comp B, MD  epoetin  alfa-epbx (RETACRIT ) 4000 UNIT/ML injection Inject 1 mL (4,000 Units total) into the vein every Monday, Wednesday, and Friday at 6 PM. 01/02/24   Awanda City, MD  esomeprazole  (NEXIUM )  40 MG capsule Take 1 capsule (40 mg total) by mouth daily before breakfast. 11/27/23   Agrawal, Kavita, MD  ferrous sulfate  325 (65 FE) MG tablet Take 325 mg by mouth daily with breakfast.    [provider]  fluticasone  (FLONASE ) 50 MCG/ACT nasal spray Place 2 sprays into both nostrils daily. 10/02/23   Tobie Eldora NOVAK, MD  fluticasone  furoate-vilanterol (BREO ELLIPTA ) 200-25 MCG/ACT AEPB Inhale 1 puff into the lungs daily. 11/14/23   Kasa, Kurian, MD  folic acid  (FOLVITE ) 1 MG tablet Take 1 tablet (1 mg total) by mouth daily. 01/01/24   Awanda City, MD  hydrALAZINE  (APRESOLINE ) 25 MG tablet Take 1 tablet (25 mg total) by mouth 3 (three) times daily. 05/01/24   Jens Durand, MD  ipratropium-albuterol  (DUONEB) 0.5-2.5 (3) MG/3ML SOLN INHALE THE CONTENTS OF ONE (1) VIAL VIA NEBULIZATION EVERY 6 HOURS AS NEEDED FORSHORTNESS OF BREATH 04/23/24   Kasa, Kurian, MD  levothyroxine  (SYNTHROID ) 150 MCG tablet Take 150 mcg by mouth every morning. 02/25/24   [provider]  lidocaine -prilocaine  (EMLA ) cream as directed. Before dialysis 01/02/24   [provider]  loperamide  (IMODIUM ) 2 MG capsule Take 1 capsule (2 mg total) by mouth as needed for diarrhea or loose stools. 08/05/22   Amin, Sumayya, MD  metoprolol  tartrate (LOPRESSOR ) 50 MG  tablet Take 0.5 tablets (25 mg total) by mouth 2 (two) times daily. 05/01/24   Jens Durand, MD  montelukast  (SINGULAIR ) 10 MG tablet Take 1 tablet (10 mg total) by mouth at bedtime. 01/01/24   Awanda City, MD  Multiple Minerals-Vitamins (CALCIUM  & VIT D3 BONE HEALTH PO) Take 1 tablet by mouth daily. 600 mg/ 25 mg    [provider]  nystatin powder Apply 1 Application topically 3 (three) times daily.    [provider]  rosuvastatin  (CRESTOR ) 40 MG tablet Take 40 mg by mouth daily.    [provider]  senna-docusate (SENOKOT-S) 8.6-50 MG tablet Take 2 tablets by mouth at bedtime as needed for mild constipation. 08/06/23   Alexander, Natalie, DO  sertraline  (ZOLOFT ) 100 MG tablet TAKE 1 TABLET BY MOUTH DAILY 05/13/24   Ostwalt, Janna, PA-C  torsemide  (DEMADEX ) 20 MG tablet Take 40 mg by mouth daily.    [provider]  vitamin B-12 (CYANOCOBALAMIN ) 500 MCG tablet Take 500 mcg by mouth daily.    [provider]    Physical Exam: Vitals:   05/18/24 0900 05/18/24 0930 05/18/24 0943 05/18/24 0957  BP: (!) 146/50 (!) 149/66    Pulse: 75 79  76  Resp: (!) 31 20    Temp:      TempSrc:      SpO2: 92% 95% 97% 95%  Weight:      Height:        Physical Exam   Constitutional: Alert, awake, calm, comfortable HEENT: Neck supple Respiratory: Bilateral decreased air entry and bilateral basal Rales. Cardiovascular: Regular rate and rhythm, no murmurs / rubs / gallops. No extremity edema. 2+ pedal pulses. No carotid bruits.  Bilateral 2+ pitting edema Abdomen: Soft, no tenderness, Bowel sounds positive.  Musculoskeletal: no clubbing / cyanosis. Good ROM, no contractures. Normal muscle tone.  Skin: no rashes, lesions, ulcers. Neurologic: CN 2-12 grossly intact. Sensation intact, No focal deficit identified Psychiatric: Alert and oriented x 3. Normal mood.    Labs on Admission: I have personally reviewed following labs and imaging studies  CBC: Recent Labs   Lab 05/18/24 0500  WBC 12.0*  HGB 10.5*  HCT 35.0*  MCV 96.2  PLT 202   Basic Metabolic Panel: Recent Labs  Lab 05/18/24 0500  NA 140  K 4.2  CL 103  CO2 25  GLUCOSE 183*  BUN 36*  CREATININE 3.16*  CALCIUM  9.3   GFR: Estimated Creatinine Clearance: 12.8 mL/min (A) (by C-G formula based on SCr of 3.16 mg/dL (H)). Liver Function Tests: Recent Labs  Lab 05/18/24 0500  AST 38  ALT 22  ALKPHOS 108  BILITOT 1.0  PROT 7.6  ALBUMIN 4.0   No results for input(s): LIPASE, AMYLASE in the last 168 hours. No results for input(s): AMMONIA in the last 168 hours. Coagulation Profile: No results for input(s): INR, PROTIME in the last 168 hours. Cardiac Enzymes: Recent Labs  Lab 05/18/24 0500 05/18/24 0751  TROPONINIHS 32* 35*   BNP (last 3 results) Recent Labs    03/09/24 1918 04/24/24 0633 05/18/24 0500  BNP 2,527.2* 1,057.8* 2,692.7*   HbA1C: No results for input(s): HGBA1C in the last 72 hours. CBG: No results for input(s): GLUCAP in the last 168 hours. Lipid Profile: No results for input(s): CHOL, HDL, LDLCALC, TRIG, CHOLHDL, LDLDIRECT in the last 72 hours. Thyroid  Function Tests: No results for input(s): TSH, T4TOTAL, FREET4, T3FREE, THYROIDAB in the last 72 hours. Anemia Panel: No results for input(s): VITAMINB12, FOLATE, FERRITIN, TIBC, IRON, RETICCTPCT in the last 72 hours. Urine analysis:    Component Value Date/Time   COLORURINE YELLOW (A) 09/24/2023 1050   APPEARANCEUR CLOUDY (A) 09/24/2023 1050   APPEARANCEUR Clear 09/17/2018 1028   LABSPEC 1.012 09/24/2023 1050   LABSPEC 1.013 06/20/2012 1508   PHURINE 5.0 09/24/2023 1050   GLUCOSEU NEGATIVE 09/24/2023 1050   GLUCOSEU Negative 06/20/2012 1508   HGBUR SMALL (A) 09/24/2023 1050   BILIRUBINUR NEGATIVE 09/24/2023 1050   BILIRUBINUR Negative 09/17/2018 1028   BILIRUBINUR Negative 06/20/2012 1508   KETONESUR NEGATIVE 09/24/2023 1050   PROTEINUR 100  (A) 09/24/2023 1050   NITRITE NEGATIVE 09/24/2023 1050   LEUKOCYTESUR LARGE (A) 09/24/2023 1050   LEUKOCYTESUR Negative 06/20/2012 1508    Radiological Exams on Admission: I have personally reviewed images DG Chest Portable 1 View Result Date: 05/18/2024 EXAM: 1 VIEW XRAY OF THE CHEST 05/18/2024 05:00:00 AM COMPARISON: None available. CLINICAL HISTORY: Shortness of breath. To ER from home via EMS after waking up in respiratory distress. Hypoxia FINDINGS: LUNGS AND PLEURA: Increased and diffuse interstitial edema is present. Small effusions are suspected. No significant airspace consolidation is present. HEART AND MEDIASTINUM: Cardiomegaly is again noted. Atherosclerotic changes are present at the aortic arch. BONES AND SOFT TISSUES: A left IJ dialysis catheter is stable. IMPRESSION: 1. Increased and diffuse interstitial edema with suspected small effusions. No significant airspace consolidation. 2. Cardiomegaly and atherosclerotic changes at the aortic arch. Electronically signed by: Lonni Necessary MD 05/18/2024 05:26 AM EDT RP Workstation: [id number]    EKG: My personal interpretation of EKG shows: SR at 56 bpm    Assessment/Plan Principal Problem:   Pulmonary edema Active Problems:   Atrial fibrillation with rapid ventricular response (HCC)   COPD (chronic obstructive pulmonary disease) (HCC)   HLD (hyperlipidemia)   Essential hypertension   Type 2 diabetes mellitus with obesity (HCC)   Hypothyroidism   CHF (congestive heart failure) (HCC)    Assessment and Plan: 83 year old female W/PMH of COPD on home oxygen 3.5 L via nasal cannula, ESRD on hemodialysis, CHF, HTN, HLD, hypothyroidism, diabetes who was brought in by ambulance for evaluation of acute shortness of breath started within 24  hours.  1.  Acute hypoxemic respiratory failure likely due to CHF exacerbation - She will be admitted to the hospital as inpatient. - Patient has a history of ESRD on hemodialysis, last  dialysis patient did not have adequate fluid removed per patient. - She will be given oxygen to maintain saturation more than 90% - She uses 3.5 L by nasal cannula oxygen - Last echo was done in June 2025 with normal ejection fraction.  2.  Acute exacerbation of congestive heart failure -Last ejection fraction was normal - It appears to me that this may be related to fluid overload - I will give her Lasix  40 mg twice a day - Input output charting - Daily weight  3.  History of COPD on home oxygen - Does not appear to be in exacerbation - Will continue oxygen to maintain saturation more than 90% - Continue DuoNebs  4.  ESRD on hemodialysis Monday Wednesday and Friday - Nephrology consult will be called for dialysis  5.  HTN/HLD/hypothyroidism - Resume home medications  6.  Type 2 diabetes - Diabetic diet - Insulin  sliding scale  7.  Paroxysmal atrial fibrillation - Patient is on Eliquis  and metoprolol   8.  Obstructive sleep apnea - Resume nightly CPAP  9.  Chronic normocytic anemia - Continue EPO per nephrology  10.  GERD - Continue PPI     DVT prophylaxis: Eliquis  Code Status: Full Code Family Communication: None available, niece helps her and makes the decision  Disposition Plan: Home  Consults called: Nephrology  Admission status: Inpatient, Telemetry bed   Nena Rebel, MD [facility] 05/18/2024, 10:06 AM

## 2024-05-18 NOTE — ED Triage Notes (Signed)
 To ER from home via EMS after waking up in respiratory distress. Tripodding per EMS, sats 88% on 5L. Placed on cpap for EMS and 2in of nitroglycerin  paste to right chest.   Dialysis MWF.

## 2024-05-18 NOTE — ED Notes (Signed)
 Pt was given iced water and graham crackers.

## 2024-05-18 NOTE — ED Notes (Signed)
 Dialysis at bedside

## 2024-05-18 NOTE — ED Notes (Addendum)
 Resp placed pt on 3L [place] to eat lunch.

## 2024-05-18 NOTE — ED Notes (Signed)
 Pt asking for Neb treatment
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
## 2024-05-19 DIAGNOSIS — J9622 Acute and chronic respiratory failure with hypercapnia: Secondary | ICD-10-CM

## 2024-05-19 DIAGNOSIS — I10 Essential (primary) hypertension: Secondary | ICD-10-CM

## 2024-05-19 DIAGNOSIS — D509 Iron deficiency anemia, unspecified: Secondary | ICD-10-CM | POA: Insufficient documentation

## 2024-05-19 DIAGNOSIS — J439 Emphysema, unspecified: Secondary | ICD-10-CM | POA: Diagnosis not present

## 2024-05-19 DIAGNOSIS — E669 Obesity, unspecified: Secondary | ICD-10-CM

## 2024-05-19 DIAGNOSIS — J9621 Acute and chronic respiratory failure with hypoxia: Secondary | ICD-10-CM | POA: Diagnosis not present

## 2024-05-19 DIAGNOSIS — E039 Hypothyroidism, unspecified: Secondary | ICD-10-CM

## 2024-05-19 DIAGNOSIS — I480 Paroxysmal atrial fibrillation: Secondary | ICD-10-CM | POA: Diagnosis not present

## 2024-05-19 DIAGNOSIS — E785 Hyperlipidemia, unspecified: Secondary | ICD-10-CM

## 2024-05-19 DIAGNOSIS — I5033 Acute on chronic diastolic (congestive) heart failure: Secondary | ICD-10-CM | POA: Diagnosis not present

## 2024-05-19 DIAGNOSIS — E1169 Type 2 diabetes mellitus with other specified complication: Secondary | ICD-10-CM

## 2024-05-19 LAB — BLOOD GAS, ARTERIAL
Acid-Base Excess: 4.6 mmol/L — ABNORMAL HIGH (ref 0.0–2.0)
Bicarbonate: 28.3 mmol/L — ABNORMAL HIGH (ref 20.0–28.0)
FIO2: 21 %
O2 Saturation: 98.1 %
Patient temperature: 37
pCO2 arterial: 38 mmHg (ref 32–48)
pH, Arterial: 7.48 — ABNORMAL HIGH (ref 7.35–7.45)
pO2, Arterial: 78 mmHg — ABNORMAL LOW (ref 83–108)

## 2024-05-19 LAB — COMPREHENSIVE METABOLIC PANEL WITH GFR
ALT: 17 U/L (ref 0–44)
AST: 22 U/L (ref 15–41)
Albumin: 3.4 g/dL — ABNORMAL LOW (ref 3.5–5.0)
Alkaline Phosphatase: 64 U/L (ref 38–126)
Anion gap: 8 (ref 5–15)
BUN: 56 mg/dL — ABNORMAL HIGH (ref 8–23)
CO2: 29 mmol/L (ref 22–32)
Calcium: 8.7 mg/dL — ABNORMAL LOW (ref 8.9–10.3)
Chloride: 105 mmol/L (ref 98–111)
Creatinine, Ser: 3.94 mg/dL — ABNORMAL HIGH (ref 0.44–1.00)
GFR, Estimated: 11 mL/min — ABNORMAL LOW (ref >60)
Glucose, Bld: 87 mg/dL (ref 70–99)
Potassium: 3.4 mmol/L — ABNORMAL LOW (ref 3.5–5.1)
Sodium: 142 mmol/L (ref 135–145)
Total Bilirubin: 0.6 mg/dL (ref 0.0–1.2)
Total Protein: 6.4 g/dL — ABNORMAL LOW (ref 6.5–8.1)

## 2024-05-19 LAB — CBG MONITORING, ED
Glucose-Capillary: 96 mg/dL (ref 70–99)
Glucose-Capillary: 83 mg/dL (ref 70–99)
Glucose-Capillary: 122 mg/dL — ABNORMAL HIGH (ref 70–99)
Glucose-Capillary: 124 mg/dL — ABNORMAL HIGH (ref 70–99)

## 2024-05-19 LAB — CBC
HCT: 27.9 % — ABNORMAL LOW (ref 36.0–46.0)
Hemoglobin: 8.6 g/dL — ABNORMAL LOW (ref 12.0–15.0)
MCH: 28.7 pg (ref 26.0–34.0)
MCHC: 30.8 g/dL (ref 30.0–36.0)
MCV: 93 fL (ref 80.0–100.0)
Platelets: 172 K/uL (ref 150–400)
RBC: 3 MIL/uL — ABNORMAL LOW (ref 3.87–5.11)
RDW: 16.1 % — ABNORMAL HIGH (ref 11.5–15.5)
WBC: 8.1 K/uL (ref 4.0–10.5)
nRBC: 0 % (ref 0.0–0.2)

## 2024-05-19 MED ORDER — ALBUTEROL SULFATE (2.5 MG/3ML) 0.083% IN NEBU
2.5000 mg | INHALATION_SOLUTION | Freq: Once | RESPIRATORY_TRACT | Status: AC
  Administered 2024-05-19: 2.5 mg via RESPIRATORY_TRACT
  Filled 2024-05-19: qty 3

## 2024-05-19 MED ORDER — HEPARIN SODIUM (PORCINE) 1000 UNIT/ML IJ SOLN
INTRAMUSCULAR | Status: AC
  Filled 2024-05-19: qty 5

## 2024-05-19 MED ORDER — NITROGLYCERIN 0.4 MG SL SUBL
0.4000 mg | SUBLINGUAL_TABLET | SUBLINGUAL | Status: DC | PRN
  Administered 2024-05-19: 0.4 mg via SUBLINGUAL
  Filled 2024-05-19: qty 1

## 2024-05-19 MED ORDER — EPOETIN ALFA-EPBX 4000 UNIT/ML IJ SOLN
INTRAMUSCULAR | Status: AC
  Filled 2024-05-19: qty 1

## 2024-05-19 MED ORDER — EPOETIN ALFA-EPBX 4000 UNIT/ML IJ SOLN
4000.0000 [IU] | INTRAMUSCULAR | Status: DC
  Administered 2024-05-19 – 2024-05-23 (×3): 4000 [IU] via INTRAVENOUS
  Filled 2024-05-19: qty 1

## 2024-05-19 MED ORDER — GUAIFENESIN-DM 100-10 MG/5ML PO SYRP: 10.0000 mL | ORAL_SOLUTION | ORAL | Status: DC | PRN

## 2024-05-19 NOTE — Assessment & Plan Note (Signed)
 On Synthroid

## 2024-05-19 NOTE — Assessment & Plan Note (Signed)
 On Toprol  for rate control and Eliquis  for anticoagulation

## 2024-05-19 NOTE — Assessment & Plan Note (Signed)
 Hemoglobin 8.6.  On ferrous sulfate .

## 2024-05-19 NOTE — Progress Notes (Signed)
 Referring Provider: No ref. provider found Primary Care Physician:  Ostwalt, Janna, PA-C Primary Nephrologist:  Dr.   [place] for Consultation: ESRD  HPI: 83 year old female with history of hypertension, coronary artery disease, congestive heart failure, COPD, obesity, obstructive sleep apnea on CPAP, end-stage renal disease on hemodialysis on Monday Wednesday Friday schedule now comes in with history of worsening shortness of breath with chest x-ray showing congestion.  She was placed on BiPAP.  Update: Patient seen laying in bed Alert Restless, appears short of breath while laying down Answering questions in short sentences with pauses   Past Medical History:  Diagnosis Date   Actinic keratosis    Albuminuria    Allergy    Anemia    Arthritis    Asthma    Basal cell carcinoma 04/12/2017   Above right lateral brow. Nodulocystic type. EDC   Cancer (HCC)    skin   Cataract cortical, senile    CHF (congestive heart failure) (HCC)    COPD (chronic obstructive pulmonary disease) (HCC)    Diabetes mellitus without complication (HCC)    GERD (gastroesophageal reflux disease)    Hemorrhoids    History of kidney stones    Hyperlipidemia    Hypertension    Hypothyroidism    Lyme disease    No kidney function    OSA (obstructive sleep apnea)    Osteoporosis    Osteoporosis    Oxygen deficiency July 2025   Reflux esophagitis    Sleep apnea    Steatohepatitis    Steatohepatitis     Past Surgical History:  Procedure Laterality Date   A/V FISTULAGRAM N/A 12/31/2023   Procedure: A/V Fistulagram;  Surgeon: Marea Selinda RAMAN, MD;  Location: [facility] INVASIVE CV LAB;  Service: Cardiovascular;  Laterality: N/A;   ABDOMINAL HYSTERECTOMY     APPENDECTOMY     AV FISTULA PLACEMENT Left 05/19/2022   Procedure: ARTERIOVENOUS (AV) FISTULA CREATION ( BRACHIAL CEPHALIC );  Surgeon: Jama Cordella MATSU, MD;  Location: [facility] ORS;  Service: Vascular;  Laterality: Left;   CARDIAC CATHETERIZATION  1980    [facility]   CARDIAC CATHETERIZATION  08/13/2014   [facility]. no significant CAD, normal LVEDP.    CATARACT EXTRACTION     CHOLECYSTECTOMY     COLONOSCOPY     COLONOSCOPY WITH PROPOFOL  N/A 12/07/2016   Procedure: COLONOSCOPY WITH PROPOFOL ;  Surgeon: Gladis RAYMOND Mariner, MD;  Location: [facility] ENDOSCOPY;  Service: Endoscopy;  Laterality: N/A;   DIALYSIS/PERMA CATHETER INSERTION N/A 12/31/2023   Procedure: DIALYSIS/PERMA CATHETER INSERTION;  Surgeon: Marea Selinda RAMAN, MD;  Location: [facility] INVASIVE CV LAB;  Service: Cardiovascular;  Laterality: N/A;   ESOPHAGOGASTRODUODENOSCOPY     ESOPHAGOGASTRODUODENOSCOPY (EGD) WITH PROPOFOL  N/A 12/07/2016   Procedure: ESOPHAGOGASTRODUODENOSCOPY (EGD) WITH PROPOFOL ;  Surgeon: Gladis RAYMOND Mariner, MD;  Location[facility] ENDOSCOPY;  Service: Endoscopy;  Laterality: N/A;   ESOPHAGOGASTRODUODENOSCOPY (EGD) WITH PROPOFOL  N/A 01/07/2018   Procedure: ESOPHAGOGASTRODUODENOSCOPY (EGD) WITH PROPOFOL ;  Surgeon: Mariner Gladis RAYMOND, MD;  Location: [facility] ENDOSCOPY;  Service: Endoscopy;  Laterality: N/A;   EYE SURGERY     HEMATOMA EVACUATION Right 08/02/2023   Procedure: EVACUATION HEMATOMA;  Surgeon: Jordis Laneta FALCON, MD;  Location: [facility] ORS;  Service: General;  Laterality: Right;   HEMORRHOID SURGERY     PARTIAL HYSTERECTOMY     THYROIDECTOMY N/A 08/25/2019   Procedure: THYROIDECTOMY EXTRACTION OF SUBTOTAL COMPONENT; PARATHYROID  AUTOTRANSPLANT X1;  Surgeon: Marolyn Nest, MD;  Location: [facility] ORS;  Service: General;  Laterality: N/A;  With Nerve Monitoring(RLN)   TONSILLECTOMY  Prior to Admission medications   Medication Sig Start Date End Date Taking? Authorizing Provider  acetaminophen  (TYLENOL ) 500 MG tablet Take 1,000 mg by mouth every 6 (six) hours as needed for mild pain or moderate pain.    [provider]  albuterol  (VENTOLIN  HFA) 108 (90 Base) MCG/ACT inhaler Inhale 2 puffs into the lungs every 6 (six) hours as needed for wheezing or shortness of breath. 11/14/23   Kasa, Kurian,  MD  allopurinol  (ZYLOPRIM ) 100 MG tablet Take 100 mg by mouth daily. 09/17/23   [provider]  amLODipine  (NORVASC ) 5 MG tablet Take 1 tablet (5 mg total) by mouth daily. 08/06/23 08/05/24  Alexander, Natalie, DO  apixaban  (ELIQUIS ) 2.5 MG TABS tablet Take 1 tablet (2.5 mg total) by mouth 2 (two) times daily. 06/04/23 06/03/24  Von Bellis, MD  azelastine  (ASTELIN ) 0.1 % nasal spray Place 2 sprays into both nostrils 2 (two) times daily. Use in each nostril as directed 10/02/23   Tobie Comp B, MD  epoetin  alfa-epbx (RETACRIT ) 4000 UNIT/ML injection Inject 1 mL (4,000 Units total) into the vein every Monday, Wednesday, and Friday at 6 PM. 01/02/24   Awanda City, MD  esomeprazole  (NEXIUM ) 40 MG capsule Take 1 capsule (40 mg total) by mouth daily before breakfast. 11/27/23   Agrawal, Kavita, MD  ferrous sulfate  325 (65 FE) MG tablet Take 325 mg by mouth daily with breakfast.    [provider]  fluticasone  (FLONASE ) 50 MCG/ACT nasal spray Place 2 sprays into both nostrils daily. 10/02/23   Tobie Comp NOVAK, MD  fluticasone  furoate-vilanterol (BREO ELLIPTA ) 200-25 MCG/ACT AEPB Inhale 1 puff into the lungs daily. 11/14/23   Kasa, Kurian, MD  folic acid  (FOLVITE ) 1 MG tablet Take 1 tablet (1 mg total) by mouth daily. 01/01/24   Awanda City, MD  hydrALAZINE  (APRESOLINE ) 25 MG tablet Take 1 tablet (25 mg total) by mouth 3 (three) times daily. 05/01/24   Jens Durand, MD  ipratropium-albuterol  (DUONEB) 0.5-2.5 (3) MG/3ML SOLN INHALE THE CONTENTS OF ONE (1) VIAL VIA NEBULIZATION EVERY 6 HOURS AS NEEDED FORSHORTNESS OF BREATH 04/23/24   Kasa, Kurian, MD  levothyroxine  (SYNTHROID ) 150 MCG tablet Take 150 mcg by mouth every morning. 02/25/24   [provider]  lidocaine -prilocaine  (EMLA ) cream as directed. Before dialysis 01/02/24   [provider]  loperamide  (IMODIUM ) 2 MG capsule Take 1 capsule (2 mg total) by mouth as needed for diarrhea or loose stools. 08/05/22   Amin, Sumayya, MD   metoprolol  tartrate (LOPRESSOR ) 50 MG tablet Take 0.5 tablets (25 mg total) by mouth 2 (two) times daily. 05/01/24   Jens Durand, MD  montelukast  (SINGULAIR ) 10 MG tablet Take 1 tablet (10 mg total) by mouth at bedtime. 01/01/24   Awanda City, MD  Multiple Minerals-Vitamins (CALCIUM  & VIT D3 BONE HEALTH PO) Take 1 tablet by mouth daily. 600 mg/ 25 mg    [provider]  nystatin powder Apply 1 Application topically 3 (three) times daily.    [provider]  rosuvastatin  (CRESTOR ) 40 MG tablet Take 40 mg by mouth daily.    [provider]  senna-docusate (SENOKOT-S) 8.6-50 MG tablet Take 2 tablets by mouth at bedtime as needed for mild constipation. 08/06/23   Alexander, Natalie, DO  sertraline  (ZOLOFT ) 100 MG tablet TAKE 1 TABLET BY MOUTH DAILY 05/13/24   Ostwalt, Janna, PA-C  torsemide  (DEMADEX ) 20 MG tablet Take 40 mg by mouth daily.    [provider]  vitamin B-12 (CYANOCOBALAMIN ) 500 MCG  tablet Take 500 mcg by mouth daily.    [provider]    Current Facility-Administered Medications  Medication Dose Route Frequency Provider Last Rate Last Admin   acetaminophen  (TYLENOL ) tablet 650 mg  650 mg Oral Q6H PRN Paudel, Keshab, MD       Or   acetaminophen  (TYLENOL ) suppository 650 mg  650 mg Rectal Q6H PRN Roann Gouty, MD       allopurinol  (ZYLOPRIM ) tablet 100 mg  100 mg Oral Daily Paudel, Gouty, MD   100 mg at 05/19/24 0957   amLODipine  (NORVASC ) tablet 5 mg  5 mg Oral Daily Paudel, Gouty, MD   5 mg at 05/19/24 0957   apixaban  (ELIQUIS ) tablet 2.5 mg  2.5 mg Oral BID Paudel, Keshab, MD   2.5 mg at 05/19/24 0957   Chlorhexidine  Gluconate Cloth 2 % PADS 6 each  6 each Topical Q0600 Korrapati, Madhu, MD       Chlorhexidine  Gluconate Cloth 2 % PADS 6 each  6 each Topical Q0600 Dominica, Madhu, MD       cyanocobalamin  (VITAMIN B12) tablet 500 mcg  500 mcg Oral Daily Paudel, Keshab, MD   500 mcg at 05/19/24 0957   ferrous sulfate  tablet 325 mg  325  mg Oral Q breakfast Paudel, Keshab, MD   325 mg at 05/19/24 0957   furosemide  (LASIX ) injection 40 mg  40 mg Intravenous BID Paudel, Keshab, MD   40 mg at 05/19/24 0956   guaiFENesin -dextromethorphan  (ROBITUSSIN DM) 100-10 MG/5ML syrup 10 mL  10 mL Oral Q4H PRN Duncan, Hazel V, MD       hydrALAZINE  (APRESOLINE ) tablet 25 mg  25 mg Oral TID Paudel, Keshab, MD   25 mg at 05/19/24 0957   insulin  aspart (novoLOG ) injection 0-5 Units  0-5 Units Subcutaneous QHS Paudel, Keshab, MD       insulin  aspart (novoLOG ) injection 0-9 Units  0-9 Units Subcutaneous TID WC Paudel, Keshab, MD   2 Units at 05/18/24 1220   levothyroxine  (SYNTHROID ) tablet 150 mcg  150 mcg Oral q morning Paudel, Keshab, MD   150 mcg at 05/19/24 9379   metoprolol  tartrate (LOPRESSOR ) tablet 25 mg  25 mg Oral BID Paudel, Keshab, MD   25 mg at 05/19/24 0957   montelukast  (SINGULAIR ) tablet 10 mg  10 mg Oral QHS Paudel, Gouty, MD   10 mg at 05/18/24 2226   nitroGLYCERIN  (NITROSTAT ) SL tablet 0.4 mg  0.4 mg Sublingual Q5 min PRN Josette Ade, MD   0.4 mg at 05/19/24 1211   ondansetron  (ZOFRAN ) tablet 4 mg  4 mg Oral Q6H PRN Roann Gouty, MD       Or   ondansetron  (ZOFRAN ) injection 4 mg  4 mg Intravenous Q6H PRN Paudel, Keshab, MD       polyethylene glycol (MIRALAX  / GLYCOLAX ) packet 17 g  17 g Oral Daily PRN Paudel, Gouty, MD       rosuvastatin  (CRESTOR ) tablet 40 mg  40 mg Oral Daily Paudel, Keshab, MD   40 mg at 05/19/24 0956   sertraline  (ZOLOFT ) tablet 100 mg  100 mg Oral Daily Paudel, Keshab, MD   100 mg at 05/19/24 0957    Allergies as of 05/18/2024 - Review Complete 05/18/2024  Allergen Reaction Noted   Ace inhibitors  07/24/2014   Egg-derived products Diarrhea 12/06/2016   Other  07/24/2014   Prednisone   07/24/2014   Risedronate  07/24/2014   Sulfa antibiotics Itching and Swelling 07/24/2014   Sulfasalazine Other (See Comments)  12/06/2016    Family History  Problem Relation Age of Onset   Cancer Mother         breast   Kidney disease Mother    Breast cancer Mother    Diabetes Mother    Heart disease Mother    Hypertension Mother    Hypertension Father    Heart attack Father    Cancer Father    COPD Father    Arthritis Maternal Grandfather    Kidney disease Maternal Uncle     Social History   Socioeconomic History   Marital status: Married    Spouse name: Not on file   Number of children: Not on file   Years of education: Not on file   Highest education level: Associate degree: academic program  Occupational History   Not on file  Tobacco Use   Smoking status: Never   Smokeless tobacco: Never  Vaping Use   Vaping status: Never Used  Substance and Sexual Activity   Alcohol use: No   Drug use: No   Sexual activity: Not Currently    Birth control/protection: None  Other Topics Concern   Not on file  Social History Narrative   Lives at home with husband .   Social Drivers of Health   Financial Resource Strain: Medium Risk (04/14/2024)   Received from [facility]   Overall Financial Resource Strain (CARDIA)    Difficulty of Paying Living Expenses: Somewhat hard  Food Insecurity: No Food Insecurity (04/25/2024)   Hunger Vital Sign    Worried About Running Out of Food in the Last Year: Never true    Ran Out of Food in the Last Year: Never true  Transportation Needs: Unmet Transportation Needs (04/25/2024)   PRAPARE - Transportation    Lack of Transportation (Medical): Yes    Lack of Transportation (Non-Medical): Yes  Physical Activity: Insufficiently Active (06/12/2023)   Exercise Vital Sign    Days of Exercise per Week: 7 days    Minutes of Exercise per Session: 20 min  Stress: No Stress Concern Present (06/12/2023)   [employer] of Occupational Health - Occupational Stress Questionnaire    Feeling of Stress : Only a little  Social Connections: Moderately Isolated (04/25/2024)   Social Connection and Isolation Panel    Frequency of Communication with  Friends and Family: More than three times a week    Frequency of Social Gatherings with Friends and Family: More than three times a week    Attends Religious Services: Never    Database administrator or Organizations: No    Attends Banker Meetings: Never    Marital Status: Married  Catering manager Violence: Not At Risk (04/25/2024)   Humiliation, Afraid, Rape, and Kick questionnaire    Fear of Current or Ex-Partner: No    Emotionally Abused: No    Physically Abused: No    Sexually Abused: No    Physical Exam: Vital signs in last 24 hours: Temp:  [98 F (36.7 C)-98.6 F (37 C)] 98.6 F (37 C) (08/18 1327) Pulse Rate:  [46-82] 58 (08/18 1300) Resp:  [12-32] 16 (08/18 1300) BP: (122-165)/(31-78) 126/45 (08/18 1327) SpO2:  [90 %-100 %] 98 % (08/18 1300) FiO2 (%):  [28 %] 28 % (08/17 2318) Weight:  [76.8 kg] 76.8 kg (08/18 1330) Last BM Date : 05/19/24 General:   Alert,  Well-developed, well-nourished, pleasant. Head:  Normocephalic and atraumatic. Eyes:  Sclera clear, no icterus.   Conjunctiva pink. Ears:  Normal auditory acuity. Nose:  No deformity, discharge,  or lesions. Lungs:  wheeze throughout, 3L . Tachypnea  Heart:  Regular rate and rhythm; no murmurs, clicks, rubs,  or gallops. Abdomen:  Soft, nontender and nondistended. No masses, hepatosplenomegaly or hernias noted. Normal bowel sounds, without guarding, and without rebound.   Extremities:  Without clubbing or edema.  ACCESS: Reta caffey, Lt AVF  Intake/Output from previous day: 08/17 0701 - 08/18 0700 In: -  Out: 2100  Intake/Output this shift: No intake/output data recorded.  Lab Results: Recent Labs    05/18/24 0500 05/19/24 0414  WBC 12.0* 8.1  HGB 10.5* 8.6*  HCT 35.0* 27.9*  PLT 202 172   BMET Recent Labs    05/18/24 0500 05/19/24 0414  NA 140 142  K 4.2 3.4*  CL 103 105  CO2 25 29  GLUCOSE 183* 87  BUN 36* 56*  CREATININE 3.16* 3.94*  CALCIUM  9.3 8.7*   LFT Recent  Labs    05/19/24 0414  PROT 6.4*  ALBUMIN 3.4*  AST 22  ALT 17  ALKPHOS 64  BILITOT 0.6   PT/INR No results for input(s): LABPROT, INR in the last 72 hours. Hepatitis Panel Recent Labs    05/18/24 1340  HEPBSAG NON REACTIVE    Studies/Results: ECHOCARDIOGRAM COMPLETE Result Date: 05/18/2024    ECHOCARDIOGRAM REPORT   Patient Name:   NEREA BORDENAVE [facility] Date of Exam: 05/18/2024 Medical Rec #:  [id number]     Height:       62.0 in Accession #:    [id number]    Weight:       166.3 lb Date of Birth:  September 13, 1941     BSA:          1.767 m Patient Age:    83 years      BP:           160/50 mmHg Patient Gender: F             HR:           76 bpm. Exam Location:  [facility] Procedure: 2D Echo, Cardiac Doppler and Color Doppler (Both Spectral and Color            Flow Doppler were utilized during procedure). Indications:     CHF-Acute Diastolic I50.31  History:         Patient has prior history of Echocardiogram examinations. CHF.  Sonographer:     Bari Roar Referring Phys:  [id number] NENA PAUDEL Diagnosing Phys: Redell Cave MD IMPRESSIONS  1. Left ventricular ejection fraction, by estimation, is 55 to 60%. The left ventricle has normal function. The left ventricle has no regional wall motion abnormalities. There is mild left ventricular hypertrophy. Left ventricular diastolic parameters are consistent with Grade II diastolic dysfunction (pseudonormalization).  2. Right ventricular systolic function is normal. The right ventricular size is normal. There is moderately elevated pulmonary artery systolic pressure.  3. Left atrial size was severely dilated.  4. The mitral valve is normal in structure. Moderate mitral valve regurgitation.  5. The aortic valve is tricuspid. Aortic valve regurgitation is not visualized.  6. The inferior vena cava is dilated in size with >50% respiratory variability, suggesting right atrial pressure of 8 mmHg. FINDINGS  Left Ventricle: Left ventricular ejection fraction, by  estimation, is 55 to 60%. The left ventricle has normal function. The left ventricle has no regional wall motion abnormalities. The left ventricular internal cavity size was normal in size. There is  mild left  ventricular hypertrophy. Left ventricular diastolic parameters are consistent with Grade II diastolic dysfunction (pseudonormalization). Right Ventricle: The right ventricular size is normal. No increase in right ventricular wall thickness. Right ventricular systolic function is normal. There is moderately elevated pulmonary artery systolic pressure. The tricuspid regurgitant velocity is 3.51 m/s, and with an assumed right atrial pressure of 8 mmHg, the estimated right ventricular systolic pressure is 57.3 mmHg. Left Atrium: Left atrial size was severely dilated. Right Atrium: Right atrial size was normal in size. Pericardium: There is no evidence of pericardial effusion. Mitral Valve: The mitral valve is normal in structure. Moderate mitral valve regurgitation. MV peak gradient, 9.7 mmHg. The mean mitral valve gradient is 4.0 mmHg. Tricuspid Valve: The tricuspid valve is normal in structure. Tricuspid valve regurgitation is mild. Aortic Valve: The aortic valve is tricuspid. Aortic valve regurgitation is not visualized. Aortic valve mean gradient measures 8.0 mmHg. Aortic valve peak gradient measures 14.1 mmHg. Aortic valve area, by VTI measures 1.84 cm. Pulmonic Valve: The pulmonic valve was normal in structure. Pulmonic valve regurgitation is not visualized. Aorta: The aortic root and ascending aorta are structurally normal, with no evidence of dilitation. Venous: The inferior vena cava is dilated in size with greater than 50% respiratory variability, suggesting right atrial pressure of 8 mmHg. IAS/Shunts: No atrial level shunt detected by color flow Doppler.  LEFT VENTRICLE PLAX 2D LVIDd:         5.90 cm      Diastology LVIDs:         4.40 cm      LV e' medial:    4.90 cm/s LV PW:         1.20 cm      LV  E/e' medial:  32.0 LV IVS:        1.30 cm      LV e' lateral:   7.83 cm/s LVOT diam:     1.80 cm      LV E/e' lateral: 20.1 LV SV:         79 LV SV Index:   45 LVOT Area:     2.54 cm  LV Volumes (MOD) LV vol d, MOD A2C: 115.0 ml LV vol d, MOD A4C: 135.0 ml LV vol s, MOD A2C: 58.8 ml LV vol s, MOD A4C: 72.7 ml LV SV MOD A2C:     56.2 ml LV SV MOD A4C:     135.0 ml LV SV MOD BP:      63.9 ml RIGHT VENTRICLE RV Basal diam:  3.60 cm RV Mid diam:    3.40 cm RV S prime:     13.60 cm/s TAPSE (M-mode): 2.1 cm LEFT ATRIUM              Index        RIGHT ATRIUM           Index LA diam:        5.50 cm  3.11 cm/m   RA Area:     20.30 cm LA Vol (A2C):   131.0 ml 74.12 ml/m  RA Volume:   59.60 ml  33.72 ml/m LA Vol (A4C):   148.0 ml 83.73 ml/m LA Biplane Vol: 144.0 ml 81.47 ml/m  AORTIC VALVE                     PULMONIC VALVE AV Area (Vmax):    1.83 cm      PV Vmax:  1.20 m/s AV Area (Vmean):   1.84 cm      PV Peak grad:     5.8 mmHg AV Area (VTI):     1.84 cm      PR End Diast Vel: 11.97 msec AV Vmax:           188.00 cm/s   RVOT Peak grad:   4 mmHg AV Vmean:          132.000 cm/s AV VTI:            0.429 m AV Peak Grad:      14.1 mmHg AV Mean Grad:      8.0 mmHg LVOT Vmax:         135.00 cm/s LVOT Vmean:        95.600 cm/s LVOT VTI:          0.310 m LVOT/AV VTI ratio: 0.72  AORTA Ao Root diam: 2.50 cm Ao Asc diam:  3.10 cm MITRAL VALVE                TRICUSPID VALVE MV Area (PHT): 7.02 cm     TR Peak grad:   49.3 mmHg MV Area VTI:   2.15 cm     TR Vmax:        351.00 cm/s MV Peak grad:  9.7 mmHg MV Mean grad:  4.0 mmHg     SHUNTS MV Vmax:       1.56 m/s     Systemic VTI:  0.31 m MV Vmean:      99.1 cm/s    Systemic Diam: 1.80 cm MV Decel Time: 108 msec MV E velocity: 157.00 cm/s MV A velocity: 117.00 cm/s MV E/A ratio:  1.34 MV A Prime:    10.0 cm/s Redell Cave MD Electronically signed by Redell Cave MD Signature Date/Time: 05/18/2024/4:06:10 PM    Final    DG Chest Portable 1 View Result  Date: 05/18/2024 EXAM: 1 VIEW XRAY OF THE CHEST 05/18/2024 05:00:00 AM COMPARISON: None available. CLINICAL HISTORY: Shortness of breath. To ER from home via EMS after waking up in respiratory distress. Hypoxia FINDINGS: LUNGS AND PLEURA: Increased and diffuse interstitial edema is present. Small effusions are suspected. No significant airspace consolidation is present. HEART AND MEDIASTINUM: Cardiomegaly is again noted. Atherosclerotic changes are present at the aortic arch. BONES AND SOFT TISSUES: A left IJ dialysis catheter is stable. IMPRESSION: 1. Increased and diffuse interstitial edema with suspected small effusions. No significant airspace consolidation. 2. Cardiomegaly and atherosclerotic changes at the aortic arch. Electronically signed by: Lonni Necessary MD 05/18/2024 05:26 AM EDT RP Workstation: [id number]    Assessment/Plan:  83 year old female with history of hypertension, coronary artery disease, congestive heart failure, COPD, obesity, obstructive sleep apnea on CPAP, end-stage renal disease on hemodialysis on Monday Wednesday Friday schedule now comes in with history of worsening shortness of breath with chest x-ray showing congestion.  She was placed on BiPAP.  ESRD on hemodialysis: Patient has been on Monday Wednesday Friday schedule. Received urgent treatment yesterday with UF 2.1L removed. Will receive scheduled treatment today, UF goal 2.5L as tolerated. Next treatment scheduled for Wednesday.  ANEMIA with chronic kidney disease: Will continue to monitor hemoglobin levels, 8.6 today.  Will order retacrit  4000 units IV with dialysis  Secondary Hyperparathyroidism: with outpatient labs: PTH 100, phosphorus 4.8, calcium  9.7 on 05/12/24.   Lab Results  Component Value Date   CALCIUM  8.7 (L) 05/19/2024   PHOS 4.5 04/30/2024    Awaiting updated phos.  Hypertension with chronic kidney disease, receiving amlodipine , furosemide , hydralazine , and metoprolol .    LOS:  1 Rolonda Pontarelli [facility] @TODAY @1 :36 PM

## 2024-05-19 NOTE — ED Notes (Signed)
 Pt assisted to wheelchair in order to go to the restroom. Once finished pt assisted back into bed.

## 2024-05-19 NOTE — Assessment & Plan Note (Signed)
 Patient was in respiratory distress on presentation required BiPAP.  Pulse ox was in the 80s.  Venous globin this showed a PCO2 of 62.  Will get an ABG today.  Try to get off oxygen during the day.  Continue BiPAP at night.

## 2024-05-19 NOTE — Progress Notes (Signed)
 Heart Failure Navigator Progress Note  Assessed for Heart & Vascular TOC clinic readiness.  Unfortunately, patient does not meet criteria due to ESRD on Hemodialysis.   Navigator will sign off at this time.  Charmaine Pines, RN, BSN [facility] Heart Failure Navigator Secure Chat Only

## 2024-05-19 NOTE — Hospital Course (Signed)
 Hospital course / significant events:   HPI: 83 y.o. female with medical history significant for COPD on home oxygen 3.5 L/min via nasal cannula, CHF, end-stage renal disease on HD MWF. She presented to ED via EMS for shortness of breath for 24 hours.  Patient stated that she had dialysis Friday 08/15 where her ultrafiltrate was not removed.  She increased her home oxygen without improvement in her saturation.  EMS found patient to be 85% with minimal improvement after 6 L of nasal cannula.  Patient was placed on CPAP during transport. Patient stated she ran out of her nebulizers.  She felt that she did not have enough fluid taken off on Friday.  08/18.  Upon arrival to the ED, patient is found to be hypoxemic requiring 6 L of oxygen by nasal cannula still not saturating well then changed to CPAP and BiPAP in the ED. she was also given Lasix  40 mg x 1 and Solu-Medrol  125 mg x 1.  She had leukocytosis at 12.  Chest x-ray showed interstitial effusion.  Hospitalist service was consulted for evaluation for admission for acute hypoxemic respiratory failure likely due to CHF exacerbation. 08/19.  Patient states she did not sleep last night.  Still feeling short of breath.  Nephrology will dialyze again tomorrow. 08/20: dialysis again today. Stopping iv lasix , reduced antihypertensives. Still needing O2, ordered walk test in case needing to update orders to O2 2L at home continuously  08/21: low BP, adjusting meds      Consultants:  nephrology  Procedures/Surgeries: none      ASSESSMENT & PLAN:   Acute on chronic respiratory failure with hypoxia and hypercapnia  Patient was in respiratory distress on presentation required BiPAP.   Does not qualify for BiPAP at home. Okay to go back on her CPAP at home.   See if we can get off oxygen during the day - ordered walk test in case needing to update orders to O2 2L at home continuously    Acute on chronic diastolic CHF (congestive heart  failure) Patient had dialysis on admission and 08/19, 08/20 EF 55 to 60%. Dialysis today  D/c'ed IV Lasix  twice daily.  Restart home torsemide  yesterday and doing well on this    Paroxysmal atrial fibrillation  On Toprol  for rate control and Eliquis  for anticoagulation - lowered dose d/t low BP   COPD (chronic obstructive pulmonary disease) no apparent exacerbation  Patient wears oxygen at night.  Patient states that she did not fill her medication for nebulizers because it was way too expensive.   Transitioning from inhaler/ellipta devices and going to nebulized medications for ease of use for patient and pt reports better effectiveness in symptoms relief w/ nebs as follows: triple therapy for COPD - ICS w/ budesonide (Pulmicort) and SAMA-SABA w/ ipratropium-albuterol  (DuoNeb)   HLD (hyperlipidemia) On Crestor    Essential hypertension Was on Norvasc , Lasix , hydralazine , Lopressor  - d/c norvasc , hydralazine . Reduced lopressor   Adjusted meds down d/t pt concern for low BP and true low diastolic    Iron deficiency anemia Hemoglobin 8.6.   ferrous sulfate .   Hypothyroidism Synthroid    Type 2 diabetes mellitus with obesity  sliding scale insulin .       Overweight based on BMI: Body mass index is 28.83 kg/m.SABRA Significantly low or high BMI is associated with higher medical risk.  Underweight - under 18  overweight - 25 to 29 obese - 30 or more Class 1 obesity: BMI of 30.0 to 34 Class 2 obesity: BMI of  35.0 to 39 Class 3 obesity: BMI of 40.0 to 49 Super Morbid Obesity: BMI 50-59 Super-super Morbid Obesity: BMI 60+ Healthy nutrition and physical activity advised as adjunct to other disease management and risk reduction treatments    DVT prophylaxis: heparin  IV fluids: no continuous IV fluids  Nutrition: renal Central lines / other devices: permcath  Code Status: FULL CODE ACP documentation reviewed:  none on file in [facility]  TOC needs: [facility] Medical barriers to dispo:  hypotension. Expected medical readiness for discharge tomorrow.

## 2024-05-19 NOTE — Progress Notes (Signed)
 Progress Note   Patient: [name] FMW:[id number] DOB: Aug 27, 1941 DOA: 05/18/2024     1 DOS: the patient was seen and examined on 05/19/2024   Brief hospital course: 83 y.o. female with medical history significant for COPD on home oxygen 3.5 L in via nasal cannula, CHF, end-stage renal disease on dialysis Monday Wednesday and Friday who presented to ED via EMS for shortness of breath for the last 24 hours.  Patient stated that she had dialysis Friday where her ultrafiltrate was not removed.  She increased her home oxygen without improvement in her saturation.  EMS found patient to be 85% with minimal improvement after 6 L of nasal cannula.  Patient was placed on CPAP during transport with adequate oxygenation and mid 90s.  Patient was placed on BiPAP on arrival to ED.  She denies any recent travel or sick contact.  She denies any fever or chills.  She denies any nausea vomiting or diarrhea.   ED Course: Upon arrival to the ED, patient is found to be hypoxemic requiring 6 L of oxygen by nasal cannula still not saturating well then changed to CPAP and BiPAP in the ED. she was also given Lasix  40 mg x 1 and Solu-Medrol  125 mg x 1.  She had leukocytosis at 12.  Chest x-ray showed interstitial effusion.  Hospitalist service was consulted for evaluation for admission for acute hypoxemic respiratory failure likely due to CHF exacerbation.  8/18.  Patient coming in with shortness of breath failure required BiPAP on presentation.  Patient stated she ran out of her nebulizers.  She felt that she did not have enough fluid taken off on Friday.  Assessment and Plan: * Acute on chronic respiratory failure with hypoxia and hypercapnia (HCC) Patient was in respiratory distress on presentation required BiPAP.  Pulse ox was in the 80s.  Venous globin this showed a PCO2 of 62.  Will get an ABG today.  Try to get off oxygen during the day.  Continue BiPAP at night.  Acute on chronic diastolic CHF (congestive heart  failure) (HCC) Patient required dialysis yesterday to help remove fluid.  Will stay on the normal dialysis schedule today.  Continue IV Lasix  twice daily.  EF 55 to 60%.  Paroxysmal atrial fibrillation (HCC) On Toprol  for rate control and Eliquis  for anticoagulation  COPD (chronic obstructive pulmonary disease) (HCC) Patient wears oxygen at night.  Does not seem to be COPD exacerbation.  Patient states that she did not feel her medication for nebulizers because it was way too expensive.  HLD (hyperlipidemia) On Crestor   Essential hypertension On Norvasc , Lasix , hydralazine , Lopressor   Iron deficiency anemia Hemoglobin 8.6.  On ferrous sulfate .  Hypothyroidism On Synthroid   Type 2 diabetes mellitus with obesity (HCC) On sliding scale insulin .  Last hemoglobin A1c low at 5.4        Subjective: Patient feels that she did not have enough fluid taken off on Friday with dialysis.  Came in with respiratory distress and called 911.  Initially placed on BiPAP.  Feels a little bit better.  Ankle swelling better.  Physical Exam: Vitals:   05/19/24 0630 05/19/24 0730 05/19/24 1036 05/19/24 1041  BP:  (!) 135/40 (!) 147/78   Pulse: 74 (!) 57 (!) 57   Resp: (!) 21 14 (!) 22   Temp:    98.3 F (36.8 C)  TempSrc:    Oral  SpO2: 96% 90% 98%   Weight:      Height:  Physical Exam HENT:     Head: Normocephalic.     Mouth/Throat:     Pharynx: No oropharyngeal exudate.  Eyes:     General: Lids are normal.     Conjunctiva/sclera: Conjunctivae normal.  Cardiovascular:     Rate and Rhythm: Normal rate and regular rhythm.     Heart sounds: Normal heart sounds, S1 normal and S2 normal.  Pulmonary:     Breath sounds: Examination of the right-lower field reveals decreased breath sounds and rales. Examination of the left-lower field reveals decreased breath sounds and rales. Decreased breath sounds and rales present. No wheezing or rhonchi.  Abdominal:     Palpations: Abdomen is  soft.     Tenderness: There is no abdominal tenderness.  Musculoskeletal:     Right lower leg: No swelling.     Left lower leg: No swelling.  Skin:    General: Skin is warm.     Findings: No rash.  Neurological:     Mental Status: She is alert and oriented to person, place, and time.     Data Reviewed: Potassium 3.4, creatinine 3.94, BNP 2692 troponin 35, previous hemoglobin A1c 5.4, hemoglobin 8.6 Family Communication: Declined  Disposition: Status is: Inpatient Remains inpatient appropriate because: Dialysis today.  Will get ABG to see if patient qualifies for BiPAP at night.  Check pulse ox on room air  Planned Discharge Destination: Home health    Time spent: 28 minutes  Author: Charlie Patterson, MD 05/19/2024 11:44 AM  For on call review [email or web address].

## 2024-05-19 NOTE — ED Notes (Signed)
 Patient ambulated with walker with standy by assistance of PT, no oxygen required. Patient maintained sats in lowers 90's and was not in any distress

## 2024-05-19 NOTE — ED Notes (Signed)
 Pt assisted to restroom.

## 2024-05-19 NOTE — Assessment & Plan Note (Signed)
 On sliding scale insulin .  Last hemoglobin A1c low at 5.4.  BMI now down to 29.4.

## 2024-05-19 NOTE — ED Notes (Signed)
 This RN assisted pt to bathroom and gave pt wipes to help clean herself. Pt also given clean gown and womens underwear with pad. Pt escorted back to room and is laying back in bed. Pt denies any complaints.   Respiratory contacted by this RN pt reports that she is ready to go sleep and would like to be placed on her BIPAP machine.

## 2024-05-19 NOTE — Evaluation (Signed)
 Physical Therapy Evaluation Patient Details Name: [name] MRN: [id number] DOB: 07-Apr-1941 Today's Date: 05/19/2024  History of Present Illness  Pt is a pleasant 83 y.o. female with medical history significant for COPD on home oxygen 3.5 L in via nasal cannula, CHF, ESRD on dialysis MWF who presented to ED via EMS for shortness of breath. MD assessment includes: acute hypoxemic respiratory failure likely due to CHF exacerbation.   Clinical Impression  Pt was pleasant and motivated to participate during the session and put forth good effort throughout. Pt found on 1.5LO2/min with SpO2 97-98%.  Nsg in room and agreed to trial of PT on room air.  Pt's SpO2 measured frequently during the session with SpO2 remaining >/= 92% on room air throughout, nsg notified.  Pt required no physical assistance during the session with below functional tasks and presented with no overt LOB with standing/ambulating.  Pt ambulated with a mildly reduced cadence with min verbal cues for sequencing with the RW demonstrating good carryover.  Pt reported no adverse symptoms during the session with SpO2 and HR WNL throughout.  Pt is a Tourist information centre manager at baseline and did present with a decrease in overall activity tolerance compared to what is typical for her and will benefit from continued PT services upon discharge to safely address deficits listed in patient problem list for decreased caregiver assistance and eventual return to PLOF.           If plan is discharge home, recommend the following: Help with stairs or ramp for entrance;Assist for transportation;Assistance with cooking/housework;A little help with walking and/or transfers   Can travel by private vehicle        Equipment Recommendations None recommended by PT  Recommendations for Other Services       Functional Status Assessment Patient has had a recent decline in their functional status and demonstrates the ability to make significant  improvements in function in a reasonable and predictable amount of time.     Precautions / Restrictions Precautions Precautions: Fall Restrictions Weight Bearing Restrictions Per Provider Order: No      Mobility  Bed Mobility Overal bed mobility: Modified Independent             General bed mobility comments: Min extra time and effort only    Transfers Overall transfer level: Modified independent Equipment used: Rolling walker (2 wheels)               General transfer comment: Good eccentric and concentric control and stability from multiple height surfaces    Ambulation/Gait Ambulation/Gait assistance: Contact guard assist, Supervision Gait Distance (Feet): 200 Feet Assistive device: Rolling walker (2 wheels) Gait Pattern/deviations: Step-through pattern, Decreased step length - right, Decreased step length - left Gait velocity: decreased     General Gait Details: Mildly decreased cadence but steady with no overt LOB including during start/stops and 180 deg turns  Careers information officer     Tilt Bed    Modified Rankin (Stroke Patients Only)       Balance Overall balance assessment: Needs assistance   Sitting balance-Leahy Scale: Normal     Standing balance support: Bilateral upper extremity supported, During functional activity Standing balance-Leahy Scale: Good                               Pertinent Vitals/Pain Pain Assessment Pain Assessment: No/denies pain  Home Living Family/patient expects to be discharged to:: Private residence Living Arrangements: Alone Available Help at Discharge: Family;Neighbor;Available PRN/intermittently Type of Home: House Home Access: Stairs to enter Entrance Stairs-Rails: None Entrance Stairs-Number of Steps: 1   Home Layout: Two level;Laundry or work area in basement;Able to live on main level with bedroom/bathroom Home Equipment: Agricultural consultant (2  wheels);BSC/3in1;Tub bench;Grab bars - tub/shower;Wheelchair - manual      Prior Function Prior Level of Function : Independent/Modified Independent             Mobility Comments: Mod Ind amb community distances with a RW, does own grocery shopping pushing a cart, no fall history ADLs Comments: Ind with ADLs, niece helps with cleaning PRN     Extremity/Trunk Assessment   Upper Extremity Assessment Upper Extremity Assessment: Overall WFL for tasks assessed    Lower Extremity Assessment Lower Extremity Assessment: Overall WFL for tasks assessed       Communication   Communication Communication: No apparent difficulties    Cognition Arousal: Alert Behavior During Therapy: WFL for tasks assessed/performed   PT - Cognitive impairments: No apparent impairments                         Following commands: Intact       Cueing Cueing Techniques: Verbal cues     General Comments      Exercises     Assessment/Plan    PT Assessment Patient needs continued PT services  PT Problem List Decreased activity tolerance;Decreased balance       PT Treatment Interventions DME instruction;Gait training;Stair training;Therapeutic activities;Therapeutic exercise;Balance training;Neuromuscular re-education    PT Goals (Current goals can be found in the Care Plan section)  Acute Rehab PT Goals Patient Stated Goal: To return home PT Goal Formulation: With patient Time For Goal Achievement: 06/01/24 Potential to Achieve Goals: Good    Frequency Min 2X/week     Co-evaluation               AM-PAC PT 6 Clicks Mobility  Outcome Measure Help needed turning from your back to your side while in a flat bed without using bedrails?: None Help needed moving from lying on your back to sitting on the side of a flat bed without using bedrails?: None Help needed moving to and from a bed to a chair (including a wheelchair)?: None Help needed standing up from a chair  using your arms (e.g., wheelchair or bedside chair)?: None Help needed to walk in hospital room?: A Little Help needed climbing 3-5 steps with a railing? : A Little 6 Click Score: 22    End of Session Equipment Utilized During Treatment: Gait belt Activity Tolerance: Patient tolerated treatment well Patient left: in bed;with call bell/phone within reach;with bed alarm set Nurse Communication: Mobility status;Other (comment) (SpO2 results per above and that pt left on 1.5LO2/min per pt request due to pt wanting to take a nap and wanting O2 while sleeping) PT Visit Diagnosis: Difficulty in walking, not elsewhere classified (R26.2)    Time: 9041-8977 PT Time Calculation (min) (ACUTE ONLY): 24 min   Charges:   PT Evaluation $PT Eval Moderate Complexity: 1 Mod PT Treatments $Gait Training: 8-22 mins PT General Charges $$ ACUTE PT VISIT: 1 Visit       D. Scott Atzel Mccambridge PT, DPT 05/19/24, 11:21 AM

## 2024-05-19 NOTE — Assessment & Plan Note (Addendum)
 Patient wears oxygen at night.  Does not seem to be COPD exacerbation.  Patient states that she did not feel her medication for nebulizers because it was way too expensive.

## 2024-05-19 NOTE — ED Notes (Signed)
PT currently working with patient.

## 2024-05-19 NOTE — Assessment & Plan Note (Signed)
 On Crestor 

## 2024-05-19 NOTE — Progress Notes (Signed)
  Received patient in bed to unit.   Informed consent signed and in chart.    TX duration:3:15     Transported by  Hand-off given to patient's nurse.    Access used: Right internal jugular  Access issues: none   Total UF removed: 1.6 L: Medication(s) given: Epo 4,000 Post HD VS: wnl     GEANNIE Sar LPN [facility]

## 2024-05-19 NOTE — Assessment & Plan Note (Signed)
 On Norvasc , Lasix , hydralazine , Lopressor 

## 2024-05-19 NOTE — ED Notes (Signed)
 Assumed care of patient, pt resting with eyes shut, respirations even and unlabored, cpap in use, no distress noted

## 2024-05-19 NOTE — Assessment & Plan Note (Signed)
 Patient required dialysis yesterday to help remove fluid.  Will stay on the normal dialysis schedule today.  Continue IV Lasix  twice daily.  EF 55 to 60%.

## 2024-05-20 DIAGNOSIS — I480 Paroxysmal atrial fibrillation: Secondary | ICD-10-CM | POA: Diagnosis not present

## 2024-05-20 DIAGNOSIS — I5033 Acute on chronic diastolic (congestive) heart failure: Secondary | ICD-10-CM | POA: Diagnosis not present

## 2024-05-20 DIAGNOSIS — J9621 Acute and chronic respiratory failure with hypoxia: Secondary | ICD-10-CM | POA: Diagnosis not present

## 2024-05-20 DIAGNOSIS — D508 Other iron deficiency anemias: Secondary | ICD-10-CM

## 2024-05-20 DIAGNOSIS — J439 Emphysema, unspecified: Secondary | ICD-10-CM | POA: Diagnosis not present

## 2024-05-20 LAB — HEPATITIS B SURFACE ANTIBODY, QUANTITATIVE: Hep B S AB Quant (Post): <3.5 m[IU]/mL — ABNORMAL LOW

## 2024-05-20 LAB — GLUCOSE, CAPILLARY
Glucose-Capillary: 93 mg/dL (ref 70–99)
Glucose-Capillary: 133 mg/dL — ABNORMAL HIGH (ref 70–99)
Glucose-Capillary: 113 mg/dL — ABNORMAL HIGH (ref 70–99)
Glucose-Capillary: 115 mg/dL — ABNORMAL HIGH (ref 70–99)

## 2024-05-20 MED ORDER — IPRATROPIUM-ALBUTEROL 0.5-2.5 (3) MG/3ML IN SOLN
3.0000 mL | Freq: Four times a day (QID) | RESPIRATORY_TRACT | Status: DC
  Administered 2024-05-20 – 2024-05-21 (×3): 3 mL via RESPIRATORY_TRACT
  Filled 2024-05-20 (×2): qty 3

## 2024-05-20 MED ORDER — IPRATROPIUM-ALBUTEROL 0.5-2.5 (3) MG/3ML IN SOLN
3.0000 mL | RESPIRATORY_TRACT | Status: DC | PRN
  Filled 2024-05-20: qty 3

## 2024-05-20 NOTE — TOC Initial Note (Signed)
 Transition of Care [facility]) - Initial/Assessment Note    Patient Details  Name: [name] MRN: [id number] Date of Birth: [birth year]  Transition of Care [facility]) CM/SW Contact:    Lauraine JAYSON Carpen, LCSW Phone Number: 05/20/2024, 3:23 PM  Clinical Narrative:  Readmission prevention screen complete. CSW met with patient. No family at bedside. CSW introduced role and explained that discharge planning would be discussed. PCP is Janna Ostwalt, PA-C. Patient drives herself to appointments sometimes, other times niece drives her. She uses [facility]. She reports her Albuterol  for her nebulizer is $500. [employer] can likely provide nebulizer medications but will need to know which ones. Patient lives home with her cat. She is active with [facility] for PT, OT, RN. She has a RW, rollator, wheelchair, and youth BSC at home. Her rollator is old so will order a new one closer to discharge. She has nocturnal oxygen through [employer] (2 L). They said she would not qualify for a bipap but should qualify for an NIV. No further concerns. CSW will continue to follow patient for support and facilitate return home once stable.                Expected Discharge Plan: Home w Home Health Services Barriers to Discharge: Continued Medical Work up   Patient Goals and CMS Choice            Expected Discharge Plan and Services     Post Acute Care Choice: Resumption of Svcs/PTA Provider Living arrangements for the past 2 months: Single Family Home                           HH Arranged: RN, PT, OT [facility] Agency: [facility] Date [facility] Agency Contacted: 05/20/24   Representative spoke with at [facility] Agency: [place]  Prior Living Arrangements/Services Living arrangements for the past 2 months: Single Family Home Lives with:: Self Patient language and need for interpreter reviewed:: Yes Do you feel safe going back to the place where you live?: Yes      Need for Family Participation in Patient Care: Yes  (Comment) Care giver support system in place?: Yes (comment)   Criminal Activity/Legal Involvement Pertinent to Current Situation/Hospitalization: No - Comment as needed  Activities of Daily Living      Permission Sought/Granted Permission sought to share information with : Facility Industrial/product designer granted to share information with : Yes, Verbal Permission Granted     Permission granted to share info w AGENCY: [facility]        Emotional Assessment Appearance:: Appears stated age Attitude/Demeanor/Rapport: Engaged, Gracious Affect (typically observed): Accepting, Appropriate, Calm, Pleasant Orientation: : Oriented to Self, Oriented to Place, Oriented to  Time, Oriented to Situation Alcohol / Substance Use: Not Applicable Psych Involvement: No (comment)  Admission diagnosis:  CHF (congestive heart failure) (HCC) [I50.9] Acute pulmonary edema (HCC) [J81.0] Pulmonary edema [J81.1] COPD exacerbation (HCC) [J44.1] Acute respiratory failure with hypoxia (HCC) [J96.01] Patient Active Problem List   Diagnosis Date Noted   Acute on chronic respiratory failure with hypoxia and hypercapnia (HCC) 05/19/2024   Iron deficiency anemia 05/19/2024   Hospital discharge follow-up 05/09/2024   Anemia of chronic disease 05/09/2024   Debility 03/29/2024   Acute dyspnea 03/09/2024   ESRD on dialysis (HCC) 12/30/2023   CAP (community acquired pneumonia) 12/21/2023   COPD with acute exacerbation (HCC) 12/21/2023   Acute respiratory failure with hypoxia (HCC) 12/21/2023   Paroxysmal  atrial fibrillation (HCC) 12/21/2023   GERD without esophagitis 12/21/2023   Atherosclerosis of native arteries of the extremities with ulceration (HCC) 12/04/2023   UTI (urinary tract infection) 09/24/2023   Traumatic hematoma of lower leg with infection, right, subsequent encounter 07/30/2023   Hypokalemia 07/30/2023   Elevated troponin 07/30/2023   PAF (paroxysmal atrial fibrillation)  (HCC) 06/03/2023   Hypercalcemia 04/16/2023   Hypermagnesemia 04/16/2023   HLD (hyperlipidemia) 04/16/2023   COPD (chronic obstructive pulmonary disease) (HCC) 04/16/2023   Anemia in chronic kidney disease 03/27/2023   Electrolyte abnormality 03/24/2023   CKD (chronic kidney disease) stage 5, 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   Hyperparathyroidism, unspecified (HCC) 08/05/2022   Metabolic acidosis 08/02/2022   Uremia of renal origin 08/02/2022   Uremia 08/02/2022   Acute metabolic encephalopathy 08/02/2022   Acute on chronic diastolic CHF (congestive heart failure) (HCC) 08/02/2022   Chronic bronchitis (HCC) 08/02/2022   Anemia of chronic kidney failure, stage 5 (HCC) 08/02/2022   Dizziness s/p fall 08/02/2022   AKI (acute kidney injury) (HCC) 06/12/2022   Hypomagnesemia 06/12/2022   Hyperphosphatemia 06/12/2022   Pneumonia due to COVID-19 virus 06/10/2022   Hypocalcemia, symptomatic 06/10/2022   Dyslipidemia 06/10/2022   Depression 06/10/2022   Right shoulder pain 06/10/2022   Generalized weakness    Nausea vomiting and diarrhea    Chronic obstructive pulmonary disease, unspecified (HCC) 10/02/2021   Gouty arthropathy, chronic, 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   Hypothyroidism 09/01/2019   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/25/2019   Multinodular goiter 07/31/2019   Class 2 severe obesity with serious comorbidity in adult (HCC) 12/12/2018   Type 2 diabetes mellitus with obesity (HCC) 09/04/2017   Bilateral lower extremity edema 09/04/2017   Gastroesophageal reflux disease without esophagitis 05/16/2017   Vomiting and diarrhea 12/29/2016   Enteritis 12/29/2016   Acute lower UTI 12/29/2016   Incomplete emptying of bladder 11/01/2016   Urge incontinence 07/14/2015   Acute renal failure superimposed on stage 4 chronic kidney disease (HCC) 08/06/2014   Renal mass, right 08/06/2014   Precordial pain 07/24/2014   Dyspnea 07/24/2014   Essential hypertension 07/24/2014   Hyperlipidemia  07/24/2014   Bladder neoplasm of uncertain malignant potential 07/16/2014   Gross hematuria 07/16/2014   Asthma 05/17/2014   Osteoporosis 05/17/2014   Steatohepatitis 05/17/2014   OSA (obstructive sleep apnea) 05/17/2014   PCP:  Dineen Channel, PA-C Pharmacy:   [facility] - [place], [place] - [address] [place] [address] Phone: [phone] Fax: [phone]  [facility] [address], [place] - [address] [place] [address] Phone: [phone] Fax: [phone]     Social Drivers of Health (SDOH) Social History: SDOH Screenings   Food Insecurity: No Food Insecurity (04/25/2024)  Housing: High Risk (04/25/2024)  Transportation Needs: Unmet Transportation Needs (04/25/2024)  Utilities: Not At Risk (04/25/2024)  Depression (PHQ2-9): Low Risk  (05/08/2024)  Recent Concern: Depression (PHQ2-9) - Medium Risk (02/15/2024)  Financial Resource Strain: Medium Risk (04/14/2024)   Received from [facility]  Physical Activity: Insufficiently Active (06/12/2023)  Social Connections: Moderately Isolated (04/25/2024)  Stress: No Stress Concern Present (06/12/2023)  Tobacco Use: Low Risk  (05/18/2024)   SDOH Interventions:     Readmission Risk Interventions    05/20/2024    3:15 PM 07/30/2023   10:37 AM 04/19/2023    1:10 PM  Readmission Risk Prevention Plan  Transportation Screening Complete Complete Complete  Medication Review (RN Care Manager) Complete Complete Complete  PCP or Specialist appointment within 3-5 days of discharge Complete Complete   HRI or Home Care Consult Complete Complete Complete  SW Recovery Care/Counseling Consult Complete Complete Complete  Palliative Care Screening Not Applicable Not Applicable Not Applicable  Skilled Nursing Facility Not Applicable Not Applicable Not Applicable

## 2024-05-20 NOTE — Progress Notes (Signed)
 Referring Provider: No ref. provider found Primary Care Physician:  Ostwalt, Janna, PA-C Primary Nephrologist:  Dr.   [place] for Consultation: ESRD  HPI: 83 year old female with history of hypertension, coronary artery disease, congestive heart failure, COPD, obesity, obstructive sleep apnea on CPAP, end-stage renal disease on hemodialysis on Monday Wednesday Friday schedule now comes in with history of worsening shortness of breath with chest x-ray showing congestion.  She was placed on BiPAP.  Update: Patient seen sitting at side of bed Currently on 2L [place] Did mention some shortness of breath overnight   Past Medical History:  Diagnosis Date   Actinic keratosis    Albuminuria    Allergy    Anemia    Arthritis    Asthma    Basal cell carcinoma 04/12/2017   Above right lateral brow. Nodulocystic type. EDC   Cancer (HCC)    skin   Cataract cortical, senile    CHF (congestive heart failure) (HCC)    COPD (chronic obstructive pulmonary disease) (HCC)    Diabetes mellitus without complication (HCC)    GERD (gastroesophageal reflux disease)    Hemorrhoids    History of kidney stones    Hyperlipidemia    Hypertension    Hypothyroidism    Lyme disease    No kidney function    OSA (obstructive sleep apnea)    Osteoporosis    Osteoporosis    Oxygen deficiency July 2025   Reflux esophagitis    Sleep apnea    Steatohepatitis    Steatohepatitis     Past Surgical History:  Procedure Laterality Date   A/V FISTULAGRAM N/A 12/31/2023   Procedure: A/V Fistulagram;  Surgeon: Marea Selinda RAMAN, MD;  Location: [facility] INVASIVE CV LAB;  Service: Cardiovascular;  Laterality: N/A;   ABDOMINAL HYSTERECTOMY     APPENDECTOMY     AV FISTULA PLACEMENT Left 05/19/2022   Procedure: ARTERIOVENOUS (AV) FISTULA CREATION ( BRACHIAL CEPHALIC );  Surgeon: Jama Cordella MATSU, MD;  Location: [facility] ORS;  Service: Vascular;  Laterality: Left;   CARDIAC CATHETERIZATION  1980   [facility]   CARDIAC CATHETERIZATION   08/13/2014   [facility]. no significant CAD, normal LVEDP.    CATARACT EXTRACTION     CHOLECYSTECTOMY     COLONOSCOPY     COLONOSCOPY WITH PROPOFOL  N/A 12/07/2016   Procedure: COLONOSCOPY WITH PROPOFOL ;  Surgeon: Gladis RAYMOND Mariner, MD;  Location: [facility] ENDOSCOPY;  Service: Endoscopy;  Laterality: N/A;   DIALYSIS/PERMA CATHETER INSERTION N/A 12/31/2023   Procedure: DIALYSIS/PERMA CATHETER INSERTION;  Surgeon: Marea Selinda RAMAN, MD;  Location: [facility] INVASIVE CV LAB;  Service: Cardiovascular;  Laterality: N/A;   ESOPHAGOGASTRODUODENOSCOPY     ESOPHAGOGASTRODUODENOSCOPY (EGD) WITH PROPOFOL  N/A 12/07/2016   Procedure: ESOPHAGOGASTRODUODENOSCOPY (EGD) WITH PROPOFOL ;  Surgeon: Gladis RAYMOND Mariner, MD;  Location: [facility] ENDOSCOPY;  Service: Endoscopy;  Laterality: N/A;   ESOPHAGOGASTRODUODENOSCOPY (EGD) WITH PROPOFOL  N/A 01/07/2018   Procedure: ESOPHAGOGASTRODUODENOSCOPY (EGD) WITH PROPOFOL ;  Surgeon: Mariner Gladis RAYMOND, MD;  Location: [facility] ENDOSCOPY;  Service: Endoscopy;  Laterality: N/A;   EYE SURGERY     HEMATOMA EVACUATION Right 08/02/2023   Procedure: EVACUATION HEMATOMA;  Surgeon: Jordis Laneta FALCON, MD;  Location: [facility] ORS;  Service: General;  Laterality: Right;   HEMORRHOID SURGERY     PARTIAL HYSTERECTOMY     THYROIDECTOMY N/A 08/25/2019   Procedure: THYROIDECTOMY EXTRACTION OF SUBTOTAL COMPONENT; PARATHYROID  AUTOTRANSPLANT X1;  Surgeon: Marolyn Nest, MD;  Location: [facility] ORS;  Service: General;  Laterality: N/A;  With Nerve Monitoring(RLN)   TONSILLECTOMY  Prior to Admission medications   Medication Sig Start Date End Date Taking? Authorizing Provider  acetaminophen  (TYLENOL ) 500 MG tablet Take 1,000 mg by mouth every 6 (six) hours as needed for mild pain or moderate pain.    [provider]  albuterol  (VENTOLIN  HFA) 108 (90 Base) MCG/ACT inhaler Inhale 2 puffs into the lungs every 6 (six) hours as needed for wheezing or shortness of breath. 11/14/23   Kasa, Kurian, MD  allopurinol  (ZYLOPRIM ) 100 MG  tablet Take 100 mg by mouth daily. 09/17/23   [provider]  amLODipine  (NORVASC ) 5 MG tablet Take 1 tablet (5 mg total) by mouth daily. 08/06/23 08/05/24  Alexander, Natalie, DO  apixaban  (ELIQUIS ) 2.5 MG TABS tablet Take 1 tablet (2.5 mg total) by mouth 2 (two) times daily. 06/04/23 06/03/24  Von Bellis, MD  azelastine  (ASTELIN ) 0.1 % nasal spray Place 2 sprays into both nostrils 2 (two) times daily. Use in each nostril as directed 10/02/23   Tobie Comp B, MD  epoetin  alfa-epbx (RETACRIT ) 4000 UNIT/ML injection Inject 1 mL (4,000 Units total) into the vein every Monday, Wednesday, and Friday at 6 PM. 01/02/24   Awanda City, MD  esomeprazole  (NEXIUM ) 40 MG capsule Take 1 capsule (40 mg total) by mouth daily before breakfast. 11/27/23   Agrawal, Kavita, MD  ferrous sulfate  325 (65 FE) MG tablet Take 325 mg by mouth daily with breakfast.    [provider]  fluticasone  (FLONASE ) 50 MCG/ACT nasal spray Place 2 sprays into both nostrils daily. 10/02/23   Tobie Comp NOVAK, MD  fluticasone  furoate-vilanterol (BREO ELLIPTA ) 200-25 MCG/ACT AEPB Inhale 1 puff into the lungs daily. 11/14/23   Kasa, Kurian, MD  folic acid  (FOLVITE ) 1 MG tablet Take 1 tablet (1 mg total) by mouth daily. 01/01/24   Awanda [place], MD  hydrALAZINE  (APRESOLINE ) 25 MG tablet Take 1 tablet (25 mg total) by mouth 3 (three) times daily. 05/01/24   Jens Durand, MD  ipratropium-albuterol  (DUONEB) 0.5-2.5 (3) MG/3ML SOLN INHALE THE CONTENTS OF ONE (1) VIAL VIA NEBULIZATION EVERY 6 HOURS AS NEEDED FORSHORTNESS OF BREATH 04/23/24   Kasa, Kurian, MD  levothyroxine  (SYNTHROID ) 150 MCG tablet Take 150 mcg by mouth every morning. 02/25/24   [provider]  lidocaine -prilocaine  (EMLA ) cream as directed. Before dialysis 01/02/24   [provider]  loperamide  (IMODIUM ) 2 MG capsule Take 1 capsule (2 mg total) by mouth as needed for diarrhea or loose stools. 08/05/22   Amin, Sumayya, MD  metoprolol  tartrate (LOPRESSOR ) 50 MG  tablet Take 0.5 tablets (25 mg total) by mouth 2 (two) times daily. 05/01/24   Jens Durand, MD  montelukast  (SINGULAIR ) 10 MG tablet Take 1 tablet (10 mg total) by mouth at bedtime. 01/01/24   Awanda City, MD  Multiple Minerals-Vitamins (CALCIUM  & VIT D3 BONE HEALTH PO) Take 1 tablet by mouth daily. 600 mg/ 25 mg    [provider]  nystatin powder Apply 1 Application topically 3 (three) times daily.    [provider]  rosuvastatin  (CRESTOR ) 40 MG tablet Take 40 mg by mouth daily.    [provider]  senna-docusate (SENOKOT-S) 8.6-50 MG tablet Take 2 tablets by mouth at bedtime as needed for mild constipation. 08/06/23   Alexander, Natalie, DO  sertraline  (ZOLOFT ) 100 MG tablet TAKE 1 TABLET BY MOUTH DAILY 05/13/24   Ostwalt, Janna, PA-C  torsemide  (DEMADEX ) 20 MG tablet Take 40 mg by mouth daily.    [provider]  vitamin B-12 (CYANOCOBALAMIN ) 500 MCG  tablet Take 500 mcg by mouth daily.    [provider]    Current Facility-Administered Medications  Medication Dose Route Frequency Provider Last Rate Last Admin   acetaminophen  (TYLENOL ) tablet 650 mg  650 mg Oral Q6H PRN Paudel, Keshab, MD       Or   acetaminophen  (TYLENOL ) suppository 650 mg  650 mg Rectal Q6H PRN Paudel, Keshab, MD       allopurinol  (ZYLOPRIM ) tablet 100 mg  100 mg Oral Daily Paudel, Keshab, MD   100 mg at 05/20/24 1017   amLODipine  (NORVASC ) tablet 5 mg  5 mg Oral Daily Paudel, Nena, MD   5 mg at 05/20/24 1017   apixaban  (ELIQUIS ) tablet 2.5 mg  2.5 mg Oral BID Paudel, Keshab, MD   2.5 mg at 05/20/24 1017   Chlorhexidine  Gluconate Cloth 2 % PADS 6 each  6 each Topical Q0600 Korrapati, Madhu, MD       Chlorhexidine  Gluconate Cloth 2 % PADS 6 each  6 each Topical Q0600 Korrapati, Madhu, MD   6 each at 05/20/24 0230   cyanocobalamin  (VITAMIN B12) tablet 500 mcg  500 mcg Oral Daily Paudel, Keshab, MD   500 mcg at 05/20/24 1017   epoetin  alfa-epbx (RETACRIT ) injection 4,000 Units   4,000 Units Intravenous Q M,W,F-1800 Kenika Sahm, NP   4,000 Units at 05/19/24 1632   ferrous sulfate  tablet 325 mg  325 mg Oral Q breakfast Paudel, Nena, MD   325 mg at 05/20/24 1018   furosemide  (LASIX ) injection 40 mg  40 mg Intravenous BID Paudel, Keshab, MD   40 mg at 05/20/24 1017   guaiFENesin -dextromethorphan  (ROBITUSSIN DM) 100-10 MG/5ML syrup 10 mL  10 mL Oral Q4H PRN Duncan, Hazel V, MD       hydrALAZINE  (APRESOLINE ) tablet 25 mg  25 mg Oral TID Paudel, Keshab, MD   25 mg at 05/20/24 1017   insulin  aspart (novoLOG ) injection 0-5 Units  0-5 Units Subcutaneous QHS Paudel, Nena, MD       insulin  aspart (novoLOG ) injection 0-9 Units  0-9 Units Subcutaneous TID WC Paudel, Keshab, MD   2 Units at 05/18/24 1220   levothyroxine  (SYNTHROID ) tablet 150 mcg  150 mcg Oral q morning Paudel, Keshab, MD   150 mcg at 05/20/24 0544   metoprolol  tartrate (LOPRESSOR ) tablet 25 mg  25 mg Oral BID Paudel, Keshab, MD   25 mg at 05/20/24 1017   montelukast  (SINGULAIR ) tablet 10 mg  10 mg Oral QHS Paudel, Keshab, MD   10 mg at 05/19/24 2156   nitroGLYCERIN  (NITROSTAT ) SL tablet 0.4 mg  0.4 mg Sublingual Q5 min PRN Josette Ade, MD   0.4 mg at 05/19/24 1211   ondansetron  (ZOFRAN ) tablet 4 mg  4 mg Oral Q6H PRN Paudel, Keshab, MD       Or   ondansetron  (ZOFRAN ) injection 4 mg  4 mg Intravenous Q6H PRN Paudel, Keshab, MD       polyethylene glycol (MIRALAX  / GLYCOLAX ) packet 17 g  17 g Oral Daily PRN Paudel, Nena, MD       rosuvastatin  (CRESTOR ) tablet 40 mg  40 mg Oral Daily Paudel, Keshab, MD   40 mg at 05/20/24 1017   sertraline  (ZOLOFT ) tablet 100 mg  100 mg Oral Daily Paudel, Keshab, MD   100 mg at 05/20/24 1017    Allergies as of 05/18/2024 - Review Complete 05/18/2024  Allergen Reaction Noted   Ace inhibitors  07/24/2014   Egg-derived products Diarrhea 12/06/2016  Other  07/24/2014   Prednisone   07/24/2014   Risedronate  07/24/2014   Sulfa antibiotics Itching and Swelling 07/24/2014    Sulfasalazine Other (See Comments) 12/06/2016    Family History  Problem Relation Age of Onset   Cancer Mother        breast   Kidney disease Mother    Breast cancer Mother    Diabetes Mother    Heart disease Mother    Hypertension Mother    Hypertension Father    Heart attack Father    Cancer Father    COPD Father    Arthritis Maternal Grandfather    Kidney disease Maternal Uncle     Social History   Socioeconomic History   Marital status: Married    Spouse name: Not on file   Number of children: Not on file   Years of education: Not on file   Highest education level: Associate degree: academic program  Occupational History   Not on file  Tobacco Use   Smoking status: Never   Smokeless tobacco: Never  Vaping Use   Vaping status: Never Used  Substance and Sexual Activity   Alcohol use: No   Drug use: No   Sexual activity: Not Currently    Birth control/protection: None  Other Topics Concern   Not on file  Social History Narrative   Lives at home with husband .   Social Drivers of Health   Financial Resource Strain: Medium Risk (04/14/2024)   Received from [facility]   Overall Financial Resource Strain (CARDIA)    Difficulty of Paying Living Expenses: Somewhat hard  Food Insecurity: No Food Insecurity (04/25/2024)   Hunger Vital Sign    Worried About Running Out of Food in the Last Year: Never true    Ran Out of Food in the Last Year: Never true  Transportation Needs: Unmet Transportation Needs (04/25/2024)   PRAPARE - Transportation    Lack of Transportation (Medical): Yes    Lack of Transportation (Non-Medical): Yes  Physical Activity: Insufficiently Active (06/12/2023)   Exercise Vital Sign    Days of Exercise per Week: 7 days    Minutes of Exercise per Session: 20 min  Stress: No Stress Concern Present (06/12/2023)   [employer] of Occupational Health - Occupational Stress Questionnaire    Feeling of Stress : Only a little   Social Connections: Moderately Isolated (04/25/2024)   Social Connection and Isolation Panel    Frequency of Communication with Friends and Family: More than three times a week    Frequency of Social Gatherings with Friends and Family: More than three times a week    Attends Religious Services: Never    Database administrator or Organizations: No    Attends Banker Meetings: Never    Marital Status: Married  Catering manager Violence: Not At Risk (04/25/2024)   Humiliation, Afraid, Rape, and Kick questionnaire    Fear of Current or Ex-Partner: No    Emotionally Abused: No    Physically Abused: No    Sexually Abused: No    Physical Exam: Vital signs in last 24 hours: Temp:  [97.8 F (36.6 C)-99.1 F (37.3 C)] 98 F (36.7 C) (08/19 1150) Pulse Rate:  [52-66] 52 (08/19 1150) Resp:  [14-30] 18 (08/19 1150) BP: (122-156)/(38-73) 122/40 (08/19 1150) SpO2:  [91 %-100 %] 100 % (08/19 1150) FiO2 (%):  [28 %] 28 % (08/18 2315) Weight:  [72.2 kg-72.9 kg] 72.9 kg (08/19 0215) Last BM  Date : 05/20/24 General:   Alert,  Well-developed, well-nourished, pleasant. Head:  Normocephalic and atraumatic. Eyes:  Sclera clear, no icterus.   Conjunctiva pink. Ears:  Normal auditory acuity. Nose:  No deformity, discharge,  or lesions. Lungs:  wheeze throughout, 3L [place]. Tachypnea  Heart:  Regular rate and rhythm; no murmurs, clicks, rubs,  or gallops. Abdomen:  Soft, nontender and nondistended. No masses, hepatosplenomegaly or hernias noted. Normal bowel sounds, without guarding, and without rebound.   Extremities:  Without clubbing or edema.  ACCESS: Lt permcath, Lt AVF  Intake/Output from previous day: 08/18 0701 - 08/19 0700 In: -  Out: 1600  Intake/Output this shift: Total I/O In: 480 [P.O.:480] Out: -   Lab Results: Recent Labs    05/18/24 0500 05/19/24 0414  WBC 12.0* 8.1  HGB 10.5* 8.6*  HCT 35.0* 27.9*  PLT 202 172   BMET Recent Labs    05/18/24 0500  05/19/24 0414  NA 140 142  K 4.2 3.4*  CL 103 105  CO2 25 29  GLUCOSE 183* 87  BUN 36* 56*  CREATININE 3.16* 3.94*  CALCIUM  9.3 8.7*   LFT Recent Labs    05/19/24 0414  PROT 6.4*  ALBUMIN 3.4*  AST 22  ALT 17  ALKPHOS 64  BILITOT 0.6   PT/INR No results for input(s): LABPROT, INR in the last 72 hours. Hepatitis Panel Recent Labs    05/18/24 1340  HEPBSAG NON REACTIVE    Studies/Results: No results found.   Assessment/Plan:  83 year old female with history of hypertension, coronary artery disease, congestive heart failure, COPD, obesity, obstructive sleep apnea on CPAP, end-stage renal disease on hemodialysis on Monday Wednesday Friday schedule now comes in with history of worsening shortness of breath with chest x-ray showing congestion.  She was placed on BiPAP.  ESRD on hemodialysis: Patient has been on Monday Wednesday Friday schedule.Dialysis received yesterday, UF 1.6L achieved. Next treatment scheduled for Wednesday.   ANEMIA with chronic kidney disease: Will continue to monitor hemoglobin levels, 8.6.  Ordered retacrit  4000 units IV with dialysis.  Secondary Hyperparathyroidism: with outpatient labs: PTH 100, phosphorus 4.8, calcium  9.7 on 05/12/24.   Lab Results  Component Value Date   CALCIUM  8.7 (L) 05/19/2024   PHOS 4.5 04/30/2024    Will continue to monitor bone minerals during this admission.   Hypertension with chronic kidney disease, receiving amlodipine , furosemide , hydralazine , and metoprolol .    LOS: 2 [facility] @TODAY @1 :31 PM

## 2024-05-20 NOTE — Plan of Care (Signed)

## 2024-05-20 NOTE — ED Notes (Signed)
Pt resting, no needs identified

## 2024-05-20 NOTE — Progress Notes (Signed)
 Pt receives outpt HD at [facility] on MWF. Navigator following to assist with any HD needs.  Suzen Satchel Dialysis Navigator [phone]

## 2024-05-20 NOTE — Progress Notes (Signed)
 Physical Therapy Treatment Patient Details Name: [name] MRN: [id number] DOB: 10-29-1940 Today's Date: 05/20/2024   History of Present Illness Pt is a pleasant 83 y.o. female with medical history significant for COPD on home oxygen 3.5 L in via nasal cannula, CHF, ESRD on dialysis MWF who presented to ED via EMS for shortness of breath. MD assessment includes: acute hypoxemic respiratory failure likely due to CHF exacerbation.    PT Comments  Pt was pleasant and motivated to participate during the session and put forth good effort throughout. Pt required no physical assistance during the session and presented with good control and stability with sit to/from stand transfers from various height surfaces.  Pt was able to amb a total of around 200 feet with a combination of use of a RW and without an AD.  Pt's cadence was mildly decreased without the use of an AD compared to with the RW and although she was steady with no overt LOB pt self-selected use of RW for improved confidence.  Pt was on room air throughout the session with HR WNL and with SpO2 in the mid 90s at rest and down to a low of 90% after ambulation.  Pt did report mild to moderate SOB after completing gait training that resolved quickly upon returning to sitting with pt likely benefiting from a rollator at discharge to address deficits in activity tolerance, pt in agreement.  No instability noted during dynamic standing balance training with reaching outside BOS without UE support.  Pt will benefit from continued PT services upon discharge to safely address deficits listed in patient problem list for decreased caregiver assistance and eventual return to PLOF.      If plan is discharge home, recommend the following: Help with stairs or ramp for entrance;Assist for transportation;Assistance with cooking/housework;A little help with walking and/or transfers   Can travel by private vehicle        Equipment Recommendations  Rollator (4  wheels)    Recommendations for Other Services       Precautions / Restrictions Precautions Precautions: Fall Restrictions Weight Bearing Restrictions Per Provider Order: No     Mobility  Bed Mobility               General bed mobility comments: NT, pt in sitting pre/post session    Transfers Overall transfer level: Modified independent Equipment used: None, Rolling walker (2 wheels)               General transfer comment: Good eccentric and concentric control and stability from multiple height surfaces    Ambulation/Gait Ambulation/Gait assistance: Supervision Gait Distance (Feet): 200 Feet Assistive device: Rolling walker (2 wheels), None Gait Pattern/deviations: Step-through pattern, Decreased step length - right, Decreased step length - left Gait velocity: decreased     General Gait Details: Mildly decreased cadence but steady with no overt LOB including during ambulation with a RW 150 feet and without an AD 50 feet; pt reported feeling more confident with the RW and self-selected using it for the majority of ambulation this session   Stairs             Wheelchair Mobility     Tilt Bed    Modified Rankin (Stroke Patients Only)       Balance Overall balance assessment: Needs assistance Sitting-balance support: Feet supported Sitting balance-Leahy Scale: Normal     Standing balance support: Bilateral upper extremity supported, During functional activity, No upper extremity supported Standing balance-Leahy Scale: Good  Communication Communication Communication: No apparent difficulties  Cognition Arousal: Alert Behavior During Therapy: WFL for tasks assessed/performed   PT - Cognitive impairments: No apparent impairments                         Following commands: Intact      Cueing Cueing Techniques: Verbal cues  Exercises Other Exercises Other Exercises: Dynamic standing  balance training without UE support with activities reaching outside BOS    General Comments        Pertinent Vitals/Pain Pain Assessment Pain Assessment: No/denies pain    Home Living                          Prior Function            PT Goals (current goals can now be found in the care plan section) Progress towards PT goals: Progressing toward goals    Frequency    Min 2X/week      PT Plan      Co-evaluation              AM-PAC PT 6 Clicks Mobility   Outcome Measure  Help needed turning from your back to your side while in a flat bed without using bedrails?: None Help needed moving from lying on your back to sitting on the side of a flat bed without using bedrails?: None Help needed moving to and from a bed to a chair (including a wheelchair)?: None Help needed standing up from a chair using your arms (e.g., wheelchair or bedside chair)?: None Help needed to walk in hospital room?: A Little Help needed climbing 3-5 steps with a railing? : A Little 6 Click Score: 22    End of Session Equipment Utilized During Treatment: Gait belt Activity Tolerance: Patient tolerated treatment well Patient left: in chair;with call bell/phone within reach Nurse Communication: Mobility status PT Visit Diagnosis: Difficulty in walking, not elsewhere classified (R26.2)     Time: 8672-8654 PT Time Calculation (min) (ACUTE ONLY): 18 min  Charges:    $Gait Training: 8-22 mins PT General Charges $$ ACUTE PT VISIT: 1 Visit                    D. Scott Cheryl Stabenow PT, DPT 05/20/24, 2:42 PM

## 2024-05-20 NOTE — Progress Notes (Signed)
 Progress Note   Patient: [name] FMW:[id number] DOB: Aug 13, 1941 DOA: 05/18/2024     2 DOS: the patient was seen and examined on 05/20/2024   Brief hospital course: 83 y.o. female with medical history significant for COPD on home oxygen 3.5 L in via nasal cannula, CHF, end-stage renal disease on dialysis Monday Wednesday and Friday who presented to ED via EMS for shortness of breath for the last 24 hours.  Patient stated that she had dialysis Friday where her ultrafiltrate was not removed.  She increased her home oxygen without improvement in her saturation.  EMS found patient to be 85% with minimal improvement after 6 L of nasal cannula.  Patient was placed on CPAP during transport with adequate oxygenation and mid 90s.  Patient was placed on BiPAP on arrival to ED.  She denies any recent travel or sick contact.  She denies any fever or chills.  She denies any nausea vomiting or diarrhea.   ED Course: Upon arrival to the ED, patient is found to be hypoxemic requiring 6 L of oxygen by nasal cannula still not saturating well then changed to CPAP and BiPAP in the ED. she was also given Lasix  40 mg x 1 and Solu-Medrol  125 mg x 1.  She had leukocytosis at 12.  Chest x-ray showed interstitial effusion.  Hospitalist service was consulted for evaluation for admission for acute hypoxemic respiratory failure likely due to CHF exacerbation.  8/18.  Patient coming in with shortness of breath failure required BiPAP on presentation.  Patient stated she ran out of her nebulizers.  She felt that she did not have enough fluid taken off on Friday. 8/19.  Patient states she did not sleep last night.  Still feeling short of breath.  Nephrology will dialyze again tomorrow.  Assessment and Plan: * Acute on chronic respiratory failure with hypoxia and hypercapnia (HCC) Patient was in respiratory distress on presentation required BiPAP.  Pulse ox was in the 80s.  Repeat ABG shows PCO2 of 38.  Does not qualify for BiPAP  at home.  Okay to go back on her CPAP at home.  See if we can get off oxygen during the day.  Patient does wear oxygen at night.  Acute on chronic diastolic CHF (congestive heart failure) [facility]) Patient had dialysis on admission and yesterday.  Patient will have next dialysis tomorrow.  Patient still short of breath.  Continue IV Lasix  twice daily.  EF 55 to 60%.  Paroxysmal atrial fibrillation (HCC) On Toprol  for rate control and Eliquis  for anticoagulation  COPD (chronic obstructive pulmonary disease) (HCC) Patient wears oxygen at night.  Does not seem to be COPD exacerbation.  Patient states that she did not feel her medication for nebulizers because it was way too expensive.  TOC stated we can do her nebulizer medication through the oxygen company and it should be cheaper.  HLD (hyperlipidemia) On Crestor   Essential hypertension On Norvasc , Lasix , hydralazine , Lopressor   Iron deficiency anemia Hemoglobin 8.6.  On ferrous sulfate .  Hypothyroidism On Synthroid   Type 2 diabetes mellitus with obesity (HCC) On sliding scale insulin .  Last hemoglobin A1c low at 5.4.  BMI now down to 29.4.        Subjective: Patient did not sleep well last night.  Still feels short of breath.  No chest pain.  Admitted with acute respiratory failure and heart failure.  Physical Exam: Vitals:   05/20/24 0000 05/20/24 0215 05/20/24 0443 05/20/24 1150  BP: (!) 138/40 (!) 147/49 (!) 156/54 ROLLEN)  122/40  Pulse: 63 66 63 (!) 52  Resp: (!) 22 20 19 18   Temp:  97.8 F (36.6 C) 97.9 F (36.6 C) 98 F (36.7 C)  TempSrc:  Oral Oral Oral  SpO2: 100% 91% 100% 100%  Weight:  72.9 kg    Height:  5' 2 (1.575 m)     Physical Exam HENT:     Head: Normocephalic.     Mouth/Throat:     Pharynx: No oropharyngeal exudate.  Eyes:     General: Lids are normal.     Conjunctiva/sclera: Conjunctivae normal.  Cardiovascular:     Rate and Rhythm: Normal rate and regular rhythm.     Heart sounds: Normal heart  sounds, S1 normal and S2 normal.  Pulmonary:     Breath sounds: Examination of the right-lower field reveals decreased breath sounds and rales. Examination of the left-lower field reveals decreased breath sounds and rales. Decreased breath sounds and rales present. No wheezing or rhonchi.  Abdominal:     Palpations: Abdomen is soft.     Tenderness: There is no abdominal tenderness.  Musculoskeletal:     Right lower leg: No swelling.     Left lower leg: No swelling.  Skin:    General: Skin is warm.     Findings: No rash.  Neurological:     Mental Status: She is alert and oriented to person, place, and time.     Data Reviewed: ABG shows a pH of 7.48, PO2 of 78 PCO2 of 38.   Disposition: Status is: Inpatient Remains inpatient appropriate because: Patient still short of breath.  Will have dialysis again tomorrow.  Does not qualify for BiPAP at home.  Already has a CPAP.  Planned Discharge Destination: Home    Time spent: 28 minutes Case discussed with nephrology  Author: Charlie Patterson, MD 05/20/2024 1:39 PM  For on call review [email or web address].

## 2024-05-20 NOTE — Progress Notes (Signed)
 Patient complaining of SOB. Neb treatments d/c'd earlier. MD [place] paged for neb treatment. Treatments ordered to be scheduled.  Duoneb administered to pt by this [employer] fundraiser.

## 2024-05-20 NOTE — Plan of Care (Signed)
  Problem: Coping: Goal: Ability to adjust to condition or change in health will improve Outcome: Progressing   Problem: Metabolic: Goal: Ability to maintain appropriate glucose levels will improve Outcome: Progressing   Problem: Skin Integrity: Goal: Risk for impaired skin integrity will decrease Outcome: Progressing   Problem: Tissue Perfusion: Goal: Adequacy of tissue perfusion will improve Outcome: Progressing   Problem: Cardiac: Goal: Ability to achieve and maintain adequate cardiopulmonary perfusion will improve Outcome: Progressing   Problem: Activity: Goal: Risk for activity intolerance will decrease Outcome: Progressing   Problem: Pain Managment: Goal: General experience of comfort will improve and/or be controlled Outcome: Progressing   Problem: Safety: Goal: Ability to remain free from injury will improve Outcome: Progressing   Problem: Skin Integrity: Goal: Risk for impaired skin integrity will decrease Outcome: Progressing

## 2024-05-21 DIAGNOSIS — J9621 Acute and chronic respiratory failure with hypoxia: Secondary | ICD-10-CM | POA: Diagnosis not present

## 2024-05-21 DIAGNOSIS — J9622 Acute and chronic respiratory failure with hypercapnia: Secondary | ICD-10-CM | POA: Diagnosis not present

## 2024-05-21 LAB — CBC
HCT: 30.7 % — ABNORMAL LOW (ref 36.0–46.0)
Hemoglobin: 9.9 g/dL — ABNORMAL LOW (ref 12.0–15.0)
MCH: 29 pg (ref 26.0–34.0)
MCHC: 32.2 g/dL (ref 30.0–36.0)
MCV: 90 fL (ref 80.0–100.0)
Platelets: 175 K/uL (ref 150–400)
RBC: 3.41 MIL/uL — ABNORMAL LOW (ref 3.87–5.11)
RDW: 15.7 % — ABNORMAL HIGH (ref 11.5–15.5)
WBC: 7 K/uL (ref 4.0–10.5)
nRBC: 0 % (ref 0.0–0.2)

## 2024-05-21 LAB — BASIC METABOLIC PANEL WITH GFR
Anion gap: 11 (ref 5–15)
BUN: 67 mg/dL — ABNORMAL HIGH (ref 8–23)
CO2: 26 mmol/L (ref 22–32)
Calcium: 9.5 mg/dL (ref 8.9–10.3)
Chloride: 99 mmol/L (ref 98–111)
Creatinine, Ser: 4.01 mg/dL — ABNORMAL HIGH (ref 0.44–1.00)
GFR, Estimated: 11 mL/min — ABNORMAL LOW (ref >60)
Glucose, Bld: 103 mg/dL — ABNORMAL HIGH (ref 70–99)
Potassium: 4 mmol/L (ref 3.5–5.1)
Sodium: 136 mmol/L (ref 135–145)

## 2024-05-21 LAB — GLUCOSE, CAPILLARY
Glucose-Capillary: 123 mg/dL — ABNORMAL HIGH (ref 70–99)
Glucose-Capillary: 121 mg/dL — ABNORMAL HIGH (ref 70–99)
Glucose-Capillary: 112 mg/dL — ABNORMAL HIGH (ref 70–99)

## 2024-05-21 MED ORDER — PENTAFLUOROPROP-TETRAFLUOROETH EX AERO: 1.0000 | INHALATION_SPRAY | CUTANEOUS | Status: DC | PRN

## 2024-05-21 MED ORDER — HEPARIN SODIUM (PORCINE) 1000 UNIT/ML DIALYSIS: 1000.0000 [IU] | INTRAMUSCULAR | Status: DC | PRN

## 2024-05-21 MED ORDER — ALTEPLASE 2 MG IJ SOLR: 2.0000 mg | Freq: Once | INTRAMUSCULAR | Status: DC | PRN

## 2024-05-21 MED ORDER — LIDOCAINE-PRILOCAINE 2.5-2.5 % EX CREA: 1.0000 | TOPICAL_CREAM | CUTANEOUS | Status: DC | PRN

## 2024-05-21 MED ORDER — TORSEMIDE 20 MG PO TABS
40.0000 mg | ORAL_TABLET | Freq: Every day | ORAL | Status: DC
  Administered 2024-05-22 – 2024-05-23 (×2): 40 mg via ORAL
  Filled 2024-05-21 (×2): qty 2

## 2024-05-21 MED ORDER — IPRATROPIUM-ALBUTEROL 0.5-2.5 (3) MG/3ML IN SOLN
3.0000 mL | Freq: Two times a day (BID) | RESPIRATORY_TRACT | Status: DC
  Administered 2024-05-21 – 2024-05-23 (×4): 3 mL via RESPIRATORY_TRACT
  Filled 2024-05-21 (×5): qty 3

## 2024-05-21 MED ORDER — EPOETIN ALFA-EPBX 4000 UNIT/ML IJ SOLN
INTRAMUSCULAR | Status: AC
  Filled 2024-05-21: qty 1

## 2024-05-21 MED ORDER — PENTAFLUOROPROP-TETRAFLUOROETH EX AERO
INHALATION_SPRAY | CUTANEOUS | Status: AC
  Filled 2024-05-21: qty 30

## 2024-05-21 MED ORDER — ANTICOAGULANT SODIUM CITRATE 4% (200MG/5ML) IV SOLN: 5.0000 mL | Status: DC | PRN

## 2024-05-21 MED ORDER — HYDRALAZINE HCL 10 MG PO TABS
10.0000 mg | ORAL_TABLET | Freq: Three times a day (TID) | ORAL | Status: DC
Start: 2024-05-21 — End: 2024-05-22
  Administered 2024-05-22: 10 mg via ORAL
  Filled 2024-05-21 (×2): qty 1

## 2024-05-21 MED ORDER — LIDOCAINE HCL (PF) 1 % IJ SOLN: 5.0000 mL | INTRAMUSCULAR | Status: DC | PRN

## 2024-05-21 MED ORDER — LIDOCAINE-PRILOCAINE 2.5-2.5 % EX CREA
1.0000 | TOPICAL_CREAM | CUTANEOUS | Status: DC | PRN
Start: 2024-05-21 — End: 2024-05-21

## 2024-05-21 NOTE — Plan of Care (Signed)
  Problem: Education: Goal: Ability to describe self-care measures that may prevent or decrease complications (Diabetes Survival Skills Education) will improve Outcome: Progressing Goal: Individualized Educational Video(s) Outcome: Progressing   Problem: Coping: Goal: Ability to adjust to condition or change in health will improve Outcome: Progressing   Problem: Fluid Volume: Goal: Ability to maintain a balanced intake and output will improve Outcome: Progressing   Problem: Skin Integrity: Goal: Risk for impaired skin integrity will decrease Outcome: Progressing   Problem: Tissue Perfusion: Goal: Adequacy of tissue perfusion will improve Outcome: Progressing   Problem: Activity: Goal: Capacity to carry out activities will improve Outcome: Progressing   Problem: Cardiac: Goal: Ability to achieve and maintain adequate cardiopulmonary perfusion will improve Outcome: Progressing   Problem: Clinical Measurements: Goal: Ability to maintain clinical measurements within normal limits will improve Outcome: Progressing Goal: Will remain free from infection Outcome: Progressing Goal: Diagnostic test results will improve Outcome: Progressing Goal: Respiratory complications will improve Outcome: Progressing Goal: Cardiovascular complication will be avoided Outcome: Progressing   Problem: Activity: Goal: Risk for activity intolerance will decrease Outcome: Progressing   Problem: Elimination: Goal: Will not experience complications related to bowel motility Outcome: Progressing Goal: Will not experience complications related to urinary retention Outcome: Progressing   Problem: Pain Managment: Goal: General experience of comfort will improve and/or be controlled Outcome: Progressing   Problem: Safety: Goal: Ability to remain free from injury will improve Outcome: Progressing   Problem: Skin Integrity: Goal: Risk for impaired skin integrity will decrease Outcome:  Progressing

## 2024-05-21 NOTE — Progress Notes (Signed)
 Patient reported feeling dizzy.  Vital signs and CBG obtained and are with in normal limits. Patient remains alert and oriented.  Nurse will continue to monitor.

## 2024-05-21 NOTE — Progress Notes (Signed)
 Patient  returned to room 250, cardiac monitor in place, vitals and CBG obtained.

## 2024-05-21 NOTE — Progress Notes (Signed)
 Patient refusing any antihypertensive medication or meds that will lower her BP. Per patient her blood pressure is very low for her,

## 2024-05-21 NOTE — Progress Notes (Signed)
 PT Cancellation Note  Patient Details Name: BRANNA CORTINA MRN: [id number] DOB: [birth year]   Cancelled Treatment:    Reason Eval/Treat Not Completed: Patient at procedure or test/unavailable Patient is currently in HD. Will see when patient when available.   Glenette Bookwalter 05/21/2024, 8:20 AM

## 2024-05-21 NOTE — Progress Notes (Signed)
  Received patient in bed to unit.   Informed consent signed and in chart.    TX duration: 3.5hrs     Transported back to floor  Hand-off given to patient's nurse no c/o, no acute distress noted     Access used: L AVF   cannulated x 2 16g Access issues: none   Total UF removed: 1.2L Medication(s) given: retacrit  Post HD VS: wnl Post HD weight: 71.5kg     Olivia [name] LPN [facility]

## 2024-05-21 NOTE — Care Management Important Message (Signed)
 Important Message  Patient Details  Name: [name] MRN: [id number] Date of Birth: 22-Apr-1941   Important Message Given:  Yes - Medicare IM     [name] 05/21/2024, 4:31 PM

## 2024-05-21 NOTE — Progress Notes (Signed)
 PROGRESS NOTE    [name]   FMW:[id number] DOB: 10/02/1941  DOA: 05/18/2024 Date of Service: 05/22/24 which is hospital day 4  PCP: Ostwalt, Janna, [facility] [place] course / significant events:   HPI: 83 y.o. female with medical history significant for COPD on home oxygen 3.5 L/min via nasal cannula, CHF, end-stage renal disease on HD MWF. She presented to ED via EMS for shortness of breath for 24 hours.  Patient stated that she had dialysis Friday 08/15 where her ultrafiltrate was not removed.  She increased her home oxygen without improvement in her saturation.  EMS found patient to be 85% with minimal improvement after 6 L of nasal cannula.  Patient was placed on CPAP during transport. Patient stated she ran out of her nebulizers.  She felt that she did not have enough fluid taken off on Friday.  08/18.  Upon arrival to the ED, patient is found to be hypoxemic requiring 6 L of oxygen by nasal cannula still not saturating well then changed to CPAP and BiPAP in the ED. she was also given Lasix  40 mg x 1 and Solu-Medrol  125 mg x 1.  She had leukocytosis at 12.  Chest x-ray showed interstitial effusion.  Hospitalist service was consulted for evaluation for admission for acute hypoxemic respiratory failure likely due to CHF exacerbation. 08/19.  Patient states she did not sleep last night.  Still feeling short of breath.  Nephrology will dialyze again tomorrow. 08/20: dialysis again today. Stopping iv lasix , reduced antihypertensives. Still needing O2, ordered walk test in case needing to update orders to O2 2L at home continuously       Consultants:  nephrology  Procedures/Surgeries: none      ASSESSMENT & PLAN:   Acute on chronic respiratory failure with hypoxia and hypercapnia  Patient was in respiratory distress on presentation required BiPAP.   Does not qualify for BiPAP at home. Okay to go back on her CPAP at home.   See if we can get off oxygen during the day - ordered  walk test in case needing to update orders to O2 2L at home continuously    Acute on chronic diastolic CHF (congestive heart failure) Patient had dialysis on admission and 08/19, 08/20 EF 55 to 60%. Dialysis today  D/c IV Lasix  twice daily.  Restart home torsemide    Paroxysmal atrial fibrillation  On Toprol  for rate control and Eliquis  for anticoagulation   COPD (chronic obstructive pulmonary disease) no apparent exacerbation  Patient wears oxygen at night.  Patient states that she did not fill her medication for nebulizers because it was way too expensive.   Transitioning from inhaler/ellipta devices and going to nebulized medications for ease of use for patient and pt reports better effectiveness in symptoms relief w/ nebs as follows: triple therapy for COPD - ICS w/ budesonide (Pulmicort) and SAMA-SABA w/ ipratropium-albuterol  (DuoNeb)   HLD (hyperlipidemia) On Crestor    Essential hypertension On Norvasc , Lasix , hydralazine , Lopressor  Adjusted meds down d/t pt concern for low BP and true low diastolic    Iron deficiency anemia Hemoglobin 8.6.   ferrous sulfate .   Hypothyroidism Synthroid    Type 2 diabetes mellitus with obesity  sliding scale insulin .       Overweight based on BMI: Body mass index is 28.83 kg/m.SABRA Significantly low or high BMI is associated with higher medical risk.  Underweight - under 18  overweight - 25 to 29 obese - 30 or more Class 1 obesity: BMI of 30.0  to 34 Class 2 obesity: BMI of 35.0 to 39 Class 3 obesity: BMI of 40.0 to 49 Super Morbid Obesity: BMI 50-59 Super-super Morbid Obesity: BMI 60+ Healthy nutrition and physical activity advised as adjunct to other disease management and risk reduction treatments    DVT prophylaxis: heparin  IV fluids: no continuous IV fluids  Nutrition: renal Central lines / other devices: permcath  Code Status: FULL CODE ACP documentation reviewed:  none on file in [facility]  TOC needs: [facility] Medical barriers  to dispo: O2. Expected medical readiness for discharge tomorrow.              Subjective / Brief ROS:  Patient reports feeling okay today, tired after dialysis,  Denies CP/SOB.  Pain controlled.  Denies new weakness.  Tolerating diet.  Reports no concerns w/ urination/defecation.   Family Communication: none at thist ime     Objective Findings:  Vitals:   05/22/24 0500 05/22/24 0628 05/22/24 0920 05/22/24 1310  BP:   (!) 125/50 (!) 103/36  Pulse:   (!) 58 (!) 54  Resp:      Temp:   98 F (36.7 C) 98.6 F (37 C)  TempSrc:   Oral   SpO2:  97% 97% 96%  Weight: 74.6 kg     Height:        Intake/Output Summary (Last 24 hours) at 05/22/2024 1611 Last data filed at 05/22/2024 1416 Gross per 24 hour  Intake 640 ml  Output --  Net 640 ml   Filed Weights   05/21/24 0800 05/21/24 1206 05/22/24 0500  Weight: 71.5 kg 71.5 kg 74.6 kg    Examination:  Physical Exam Cardiovascular:     Rate and Rhythm: Normal rate and regular rhythm.  Pulmonary:     Effort: Pulmonary effort is normal.     Breath sounds: Normal breath sounds.  Neurological:     General: No focal deficit present.     Mental Status: She is alert and oriented to person, place, and time.  Psychiatric:        Mood and Affect: Mood normal.        Behavior: Behavior normal.          Scheduled Medications:   allopurinol   100 mg Oral Daily   apixaban   2.5 mg Oral BID   Chlorhexidine  Gluconate Cloth  6 each Topical Q0600   Chlorhexidine  Gluconate Cloth  6 each Topical Q0600   cyanocobalamin   500 mcg Oral Daily   epoetin  alfa-epbx (RETACRIT ) injection  4,000 Units Intravenous Q M,W,F-1800   ferrous sulfate   325 mg Oral Q breakfast   insulin  aspart  0-5 Units Subcutaneous QHS   insulin  aspart  0-9 Units Subcutaneous TID WC   ipratropium-albuterol   3 mL Nebulization BID   levothyroxine   150 mcg Oral q morning   metoprolol  tartrate  12.5 mg Oral BID   montelukast   10 mg Oral QHS   rosuvastatin    40 mg Oral Daily   sertraline   100 mg Oral Daily   torsemide   40 mg Oral Daily    Continuous Infusions:    PRN Medications:  acetaminophen  **OR** acetaminophen , guaiFENesin -dextromethorphan , nitroGLYCERIN , ondansetron  **OR** ondansetron  (ZOFRAN ) IV, polyethylene glycol  Antimicrobials from admission:  Anti-infectives (From admission, onward)    None           Data Reviewed:  I have personally reviewed the following...  CBC: Recent Labs  Lab 05/18/24 0500 05/19/24 0414 05/21/24 0511  WBC 12.0* 8.1 7.0  HGB 10.5* 8.6* 9.9*  HCT 35.0* 27.9* 30.7*  MCV 96.2 93.0 90.0  PLT 202 172 175   Basic Metabolic Panel: Recent Labs  Lab 05/18/24 0500 05/19/24 0414 05/21/24 0511  NA 140 142 136  K 4.2 3.4* 4.0  CL 103 105 99  CO2 25 29 26   GLUCOSE 183* 87 103*  BUN 36* 56* 67*  CREATININE 3.16* 3.94* 4.01*  CALCIUM  9.3 8.7* 9.5   GFR: Estimated Creatinine Clearance: 10.1 mL/min (A) (by C-G formula based on SCr of 4.01 mg/dL (H)). Liver Function Tests: Recent Labs  Lab 05/18/24 0500 05/19/24 0414  AST 38 22  ALT 22 17  ALKPHOS 108 64  BILITOT 1.0 0.6  PROT 7.6 6.4*  ALBUMIN 4.0 3.4*   No results for input(s): LIPASE, AMYLASE in the last 168 hours. No results for input(s): AMMONIA in the last 168 hours. Coagulation Profile: No results for input(s): INR, PROTIME in the last 168 hours. Cardiac Enzymes: No results for input(s): CKTOTAL, CKMB, CKMBINDEX, TROPONINI in the last 168 hours. BNP (last 3 results) No results for input(s): PROBNP in the last 8760 hours. HbA1C: No results for input(s): HGBA1C in the last 72 hours. CBG: Recent Labs  Lab 05/21/24 1305 05/21/24 1536 05/21/24 2037 05/22/24 0916 05/22/24 1306  GLUCAP 123* 121* 112* 101* 134*   Lipid Profile: No results for input(s): CHOL, HDL, LDLCALC, TRIG, CHOLHDL, LDLDIRECT in the last 72 hours. Thyroid  Function Tests: No results for input(s): TSH,  T4TOTAL, FREET4, T3FREE, THYROIDAB in the last 72 hours. Anemia Panel: No results for input(s): VITAMINB12, FOLATE, FERRITIN, TIBC, IRON, RETICCTPCT in the last 72 hours. Most Recent Urinalysis On File:     Component Value Date/Time   COLORURINE YELLOW (A) 09/24/2023 1050   APPEARANCEUR CLOUDY (A) 09/24/2023 1050   APPEARANCEUR Clear 09/17/2018 1028   LABSPEC 1.012 09/24/2023 1050   LABSPEC 1.013 06/20/2012 1508   PHURINE 5.0 09/24/2023 1050   GLUCOSEU NEGATIVE 09/24/2023 1050   GLUCOSEU Negative 06/20/2012 1508   HGBUR SMALL (A) 09/24/2023 1050   BILIRUBINUR NEGATIVE 09/24/2023 1050   BILIRUBINUR Negative 09/17/2018 1028   BILIRUBINUR Negative 06/20/2012 1508   KETONESUR NEGATIVE 09/24/2023 1050   PROTEINUR 100 (A) 09/24/2023 1050   NITRITE NEGATIVE 09/24/2023 1050   LEUKOCYTESUR LARGE (A) 09/24/2023 1050   LEUKOCYTESUR Negative 06/20/2012 1508   Sepsis Labs: @LABRCNTIP (procalcitonin:4,lacticidven:4) Microbiology: Recent Results (from the past 240 hours)  Resp panel by RT-PCR (RSV, Flu A&B, Covid) Anterior Nasal Swab     Status: None   Collection Time: 05/18/24  4:45 AM   Specimen: Anterior Nasal Swab  Result Value Ref Range Status   SARS Coronavirus 2 by RT PCR NEGATIVE NEGATIVE Final    Comment: (NOTE) SARS-CoV-2 target nucleic acids are NOT DETECTED.  The SARS-CoV-2 RNA is generally detectable in upper respiratory specimens during the acute phase of infection. The lowest concentration of SARS-CoV-2 viral copies this assay can detect is 138 copies/mL. A negative result does not preclude SARS-Cov-2 infection and should not be used as the sole basis for treatment or other patient management decisions. A negative result may occur with  improper specimen collection/handling, submission of specimen other than nasopharyngeal swab, presence of viral mutation(s) within the areas targeted by this assay, and inadequate number of viral copies(<138 copies/mL).  A negative result must be combined with clinical observations, patient history, and epidemiological information. The expected result is Negative.  Fact Sheet for Patients:  BloggerCourse.com  Fact Sheet for Healthcare Providers:  SeriousBroker.it  This test is no t yet  approved or cleared by the [place]  FDA and  has been authorized for detection and/or diagnosis of SARS-CoV-2 by FDA under an Emergency Use Authorization (EUA). This EUA will remain  in effect (meaning this test can be used) for the duration of the COVID-19 declaration under Section 564(b)(1) of the Act, 21 U.S.C.section 360bbb-3(b)(1), unless the authorization is terminated  or revoked sooner.       Influenza A by PCR NEGATIVE NEGATIVE Final   Influenza B by PCR NEGATIVE NEGATIVE Final    Comment: (NOTE) The Xpert Xpress SARS-CoV-2/FLU/RSV plus assay is intended as an aid in the diagnosis of influenza from Nasopharyngeal swab specimens and should not be used as a sole basis for treatment. Nasal washings and aspirates are unacceptable for Xpert Xpress SARS-CoV-2/FLU/RSV testing.  Fact Sheet for Patients: BloggerCourse.com  Fact Sheet for Healthcare Providers: SeriousBroker.it  This test is not yet approved or cleared by the [place]  FDA and has been authorized for detection and/or diagnosis of SARS-CoV-2 by FDA under an Emergency Use Authorization (EUA). This EUA will remain in effect (meaning this test can be used) for the duration of the COVID-19 declaration under Section 564(b)(1) of the Act, 21 U.S.C. section 360bbb-3(b)(1), unless the authorization is terminated or revoked.     Resp Syncytial Virus by PCR NEGATIVE NEGATIVE Final    Comment: (NOTE) Fact Sheet for Patients: BloggerCourse.com  Fact Sheet for Healthcare  Providers: SeriousBroker.it  This test is not yet approved or cleared by the [place]  FDA and has been authorized for detection and/or diagnosis of SARS-CoV-2 by FDA under an Emergency Use Authorization (EUA). This EUA will remain in effect (meaning this test can be used) for the duration of the COVID-19 declaration under Section 564(b)(1) of the Act, 21 U.S.C. section 360bbb-3(b)(1), unless the authorization is terminated or revoked.  Performed at [facility], [address], [place], [place] [address]       Radiology Studies last 3 days: No results found.        Sera Hitsman, DO [facility] 05/22/2024, 4:11 PM    Dictation software may have been used to generate the above note. Typos may occur and escape review in typed/dictated notes. Please contact Dr Marsa directly for clarity if needed.  Staff may message me via secure chat in Epic  but this may not receive an immediate response,  please page me for urgent matters!  If 7PM-7AM, please contact night coverage [email or web address]

## 2024-05-21 NOTE — Plan of Care (Signed)

## 2024-05-21 NOTE — Progress Notes (Signed)
 Referring Provider: No ref. provider found Primary Care Physician:  Ostwalt, Janna, PA-C Primary Nephrologist:  Dr.   [place] for Consultation: ESRD  HPI: 83 year old female with history of hypertension, coronary artery disease, congestive heart failure, COPD, obesity, obstructive sleep apnea on CPAP, end-stage renal disease on hemodialysis on Monday Wednesday Friday schedule now comes in with history of worsening shortness of breath with chest x-ray showing congestion.  She was placed on BiPAP.  Update: Patient seen and evaluated during dialysis   HEMODIALYSIS FLOWSHEET:  Blood Flow Rate (mL/min): 350 mL/min Arterial Pressure (mmHg): -205.04 mmHg Venous Pressure (mmHg): 194.74 mmHg TMP (mmHg): 16.56 mmHg Ultrafiltration Rate (mL/min): 686 mL/min Dialysate Flow Rate (mL/min): 299 ml/min Dialysis Fluid Bolus: Normal Saline Bolus Amount (mL): 100 mL  Complaining of weakness   Past Medical History:  Diagnosis Date   Actinic keratosis    Albuminuria    Allergy    Anemia    Arthritis    Asthma    Basal cell carcinoma 04/12/2017   Above right lateral brow. Nodulocystic type. EDC   Cancer (HCC)    skin   Cataract cortical, senile    CHF (congestive heart failure) (HCC)    COPD (chronic obstructive pulmonary disease) (HCC)    Diabetes mellitus without complication (HCC)    GERD (gastroesophageal reflux disease)    Hemorrhoids    History of kidney stones    Hyperlipidemia    Hypertension    Hypothyroidism    Lyme disease    No kidney function    OSA (obstructive sleep apnea)    Osteoporosis    Osteoporosis    Oxygen deficiency July 2025   Reflux esophagitis    Sleep apnea    Steatohepatitis    Steatohepatitis     Past Surgical History:  Procedure Laterality Date   A/V FISTULAGRAM N/A 12/31/2023   Procedure: A/V Fistulagram;  Surgeon: Marea Selinda RAMAN, MD;  Location: [facility];  Service: Cardiovascular;  Laterality: N/A;   ABDOMINAL HYSTERECTOMY      APPENDECTOMY     AV FISTULA PLACEMENT Left 05/19/2022   Procedure: ARTERIOVENOUS (AV) FISTULA CREATION ( BRACHIAL CEPHALIC );  Surgeon: Jama Cordella MATSU, MD;  Location: [facility] ORS;  Service: Vascular;  Laterality: Left;   CARDIAC CATHETERIZATION  1980   [facility]   CARDIAC CATHETERIZATION  08/13/2014   [facility]. no significant CAD, normal LVEDP.    CATARACT EXTRACTION     CHOLECYSTECTOMY     COLONOSCOPY     COLONOSCOPY WITH PROPOFOL  N/A 12/07/2016   Procedure: COLONOSCOPY WITH PROPOFOL ;  Surgeon: Gladis RAYMOND Mariner, MD;  Location: [facility] ENDOSCOPY;  Service: Endoscopy;  Laterality: N/A;   DIALYSIS/PERMA CATHETER INSERTION N/A 12/31/2023   Procedure: DIALYSIS/PERMA CATHETER INSERTION;  Surgeon: Marea Selinda RAMAN, MD;  Location: [facility];  Service: Cardiovascular;  Laterality: N/A;   ESOPHAGOGASTRODUODENOSCOPY     ESOPHAGOGASTRODUODENOSCOPY (EGD) WITH PROPOFOL  N/A 12/07/2016   Procedure: ESOPHAGOGASTRODUODENOSCOPY (EGD) WITH PROPOFOL ;  Surgeon: Gladis RAYMOND Mariner, MD;  Location: [facility] ENDOSCOPY;  Service: Endoscopy;  Laterality: N/A;   ESOPHAGOGASTRODUODENOSCOPY (EGD) WITH PROPOFOL  N/A 01/07/2018   Procedure: ESOPHAGOGASTRODUODENOSCOPY (EGD) WITH PROPOFOL ;  Surgeon: Mariner Gladis RAYMOND, MD;  Location: [facility] ENDOSCOPY;  Service: Endoscopy;  Laterality: N/A;   EYE SURGERY     HEMATOMA EVACUATION Right 08/02/2023   Procedure: EVACUATION HEMATOMA;  Surgeon: Jordis Laneta FALCON, MD;  Location: [facility] ORS;  Service: General;  Laterality: Right;   HEMORRHOID SURGERY     PARTIAL HYSTERECTOMY     THYROIDECTOMY  N/A 08/25/2019   Procedure: THYROIDECTOMY EXTRACTION OF SUBTOTAL COMPONENT; PARATHYROID  AUTOTRANSPLANT X1;  Surgeon: Marolyn Nest, MD;  Location: [facility] ORS;  Service: General;  Laterality: N/A;  With Nerve Monitoring(RLN)   TONSILLECTOMY      Prior to Admission medications   Medication Sig Start Date End Date Taking? Authorizing Provider  acetaminophen  (TYLENOL ) 500 MG tablet Take 1,000 mg by mouth every 6  (six) hours as needed for mild pain or moderate pain.    [provider]  albuterol  (VENTOLIN  HFA) 108 (90 Base) MCG/ACT inhaler Inhale 2 puffs into the lungs every 6 (six) hours as needed for wheezing or shortness of breath. 11/14/23   Kasa, Kurian, MD  allopurinol  (ZYLOPRIM ) 100 MG tablet Take 100 mg by mouth daily. 09/17/23   [provider]  amLODipine  (NORVASC ) 5 MG tablet Take 1 tablet (5 mg total) by mouth daily. 08/06/23 08/05/24  Alexander, Natalie, DO  apixaban  (ELIQUIS ) 2.5 MG TABS tablet Take 1 tablet (2.5 mg total) by mouth 2 (two) times daily. 06/04/23 06/03/24  Von Bellis, MD  azelastine  (ASTELIN ) 0.1 % nasal spray Place 2 sprays into both nostrils 2 (two) times daily. Use in each nostril as directed 10/02/23   Tobie Comp B, MD  epoetin  alfa-epbx (RETACRIT ) 4000 UNIT/ML injection Inject 1 mL (4,000 Units total) into the vein every Monday, Wednesday, and Friday at 6 PM. 01/02/24   Awanda City, MD  esomeprazole  (NEXIUM ) 40 MG capsule Take 1 capsule (40 mg total) by mouth daily before breakfast. 11/27/23   Agrawal, Kavita, MD  ferrous sulfate  325 (65 FE) MG tablet Take 325 mg by mouth daily with breakfast.    [provider]  fluticasone  (FLONASE ) 50 MCG/ACT nasal spray Place 2 sprays into both nostrils daily. 10/02/23   Tobie Comp NOVAK, MD  fluticasone  furoate-vilanterol (BREO ELLIPTA ) 200-25 MCG/ACT AEPB Inhale 1 puff into the lungs daily. 11/14/23   Kasa, Kurian, MD  folic acid  (FOLVITE ) 1 MG tablet Take 1 tablet (1 mg total) by mouth daily. 01/01/24   Awanda City, MD  hydrALAZINE  (APRESOLINE ) 25 MG tablet Take 1 tablet (25 mg total) by mouth 3 (three) times daily. 05/01/24   Jens Durand, MD  ipratropium-albuterol  (DUONEB) 0.5-2.5 (3) MG/3ML SOLN INHALE THE CONTENTS OF ONE (1) VIAL VIA NEBULIZATION EVERY 6 HOURS AS NEEDED FORSHORTNESS OF BREATH 04/23/24   Kasa, Kurian, MD  levothyroxine  (SYNTHROID ) 150 MCG tablet Take 150 mcg by mouth every morning. 02/25/24   [provider]  lidocaine -prilocaine  (EMLA ) cream as directed. Before dialysis 01/02/24   [provider]  loperamide  (IMODIUM ) 2 MG capsule Take 1 capsule (2 mg total) by mouth as needed for diarrhea or loose stools. 08/05/22   Amin, Sumayya, MD  metoprolol  tartrate (LOPRESSOR ) 50 MG tablet Take 0.5 tablets (25 mg total) by mouth 2 (two) times daily. 05/01/24   Jens Durand, MD  montelukast  (SINGULAIR ) 10 MG tablet Take 1 tablet (10 mg total) by mouth at bedtime. 01/01/24   Awanda City, MD  Multiple Minerals-Vitamins (CALCIUM  & VIT D3 BONE HEALTH PO) Take 1 tablet by mouth daily. 600 mg/ 25 mg    [provider]  nystatin powder Apply 1 Application topically 3 (three) times daily.    [provider]  rosuvastatin  (CRESTOR ) 40 MG tablet Take 40 mg by mouth daily.    [provider]  senna-docusate (SENOKOT-S) 8.6-50 MG tablet Take 2 tablets by mouth at bedtime as needed for mild constipation. 08/06/23   Alexander, Natalie, DO  sertraline  (  ZOLOFT ) 100 MG tablet TAKE 1 TABLET BY MOUTH DAILY 05/13/24   Ostwalt, Janna, PA-C  torsemide  (DEMADEX ) 20 MG tablet Take 40 mg by mouth daily.    [provider]  vitamin B-12 (CYANOCOBALAMIN ) 500 MCG tablet Take 500 mcg by mouth daily.    [provider]    Current Facility-Administered Medications  Medication Dose Route Frequency Provider Last Rate Last Admin   acetaminophen  (TYLENOL ) tablet 650 mg  650 mg Oral Q6H PRN Paudel, Keshab, MD       Or   acetaminophen  (TYLENOL ) suppository 650 mg  650 mg Rectal Q6H PRN Roann Gouty, MD       allopurinol  (ZYLOPRIM ) tablet 100 mg  100 mg Oral Daily Paudel, Keshab, MD   100 mg at 05/20/24 1017   alteplase  (CATHFLO ACTIVASE ) injection 2 mg  2 mg Intracatheter Once PRN Korrapati, Madhu, MD       amLODipine  (NORVASC ) tablet 5 mg  5 mg Oral Daily Paudel, Gouty, MD   5 mg at 05/20/24 1017   anticoagulant sodium citrate  solution 5 mL  5 mL Intracatheter PRN Korrapati,  Madhu, MD       apixaban  (ELIQUIS ) tablet 2.5 mg  2.5 mg Oral BID Paudel, Keshab, MD   2.5 mg at 05/20/24 2131   Chlorhexidine  Gluconate Cloth 2 % PADS 6 each  6 each Topical Q0600 Korrapati, Madhu, MD       Chlorhexidine  Gluconate Cloth 2 % PADS 6 each  6 each Topical Q0600 Korrapati, Madhu, MD   6 each at 05/21/24 9386   cyanocobalamin  (VITAMIN B12) tablet 500 mcg  500 mcg Oral Daily Paudel, Keshab, MD   500 mcg at 05/20/24 1017   epoetin  alfa-epbx (RETACRIT ) injection 4,000 Units  4,000 Units Intravenous Q M,W,F-1800 Quintavious Rinck, NP   4,000 Units at 05/19/24 1632   ferrous sulfate  tablet 325 mg  325 mg Oral Q breakfast Paudel, Gouty, MD   325 mg at 05/20/24 1018   furosemide  (LASIX ) injection 40 mg  40 mg Intravenous BID Paudel, Keshab, MD   40 mg at 05/20/24 1754   guaiFENesin -dextromethorphan  (ROBITUSSIN DM) 100-10 MG/5ML syrup 10 mL  10 mL Oral Q4H PRN Duncan, Hazel V, MD       heparin  injection 1,000 Units  1,000 Units Intracatheter PRN Korrapati, Madhu, MD       hydrALAZINE  (APRESOLINE ) tablet 25 mg  25 mg Oral TID Paudel, Keshab, MD   25 mg at 05/20/24 2130   insulin  aspart (novoLOG ) injection 0-5 Units  0-5 Units Subcutaneous QHS Paudel, Keshab, MD       insulin  aspart (novoLOG ) injection 0-9 Units  0-9 Units Subcutaneous TID WC Paudel, Keshab, MD   2 Units at 05/18/24 1220   ipratropium-albuterol  (DUONEB) 0.5-2.5 (3) MG/3ML nebulizer solution 3 mL  3 mL Nebulization BID Alexander, Natalie, DO       levothyroxine  (SYNTHROID ) tablet 150 mcg  150 mcg Oral q morning Paudel, Keshab, MD   150 mcg at 05/21/24 9385   lidocaine  (PF) (XYLOCAINE ) 1 % injection 5 mL  5 mL Intradermal PRN Dominica Brandy, MD       lidocaine -prilocaine  (EMLA ) cream 1 Application  1 Application Topical PRN Korrapati, Madhu, MD       metoprolol  tartrate (LOPRESSOR ) tablet 25 mg  25 mg Oral BID Paudel, Keshab, MD   25 mg at 05/20/24 2131   montelukast  (SINGULAIR ) tablet 10 mg  10 mg Oral QHS Paudel, Keshab, MD    10 mg at 05/20/24 2131  nitroGLYCERIN  (NITROSTAT ) SL tablet 0.4 mg  0.4 mg Sublingual Q5 min PRN Josette Ade, MD   0.4 mg at 05/19/24 1211   ondansetron  (ZOFRAN ) tablet 4 mg  4 mg Oral Q6H PRN Roann Gouty, MD       Or   ondansetron  (ZOFRAN ) injection 4 mg  4 mg Intravenous Q6H PRN Paudel, Gouty, MD       pentafluoroprop-tetrafluoroeth (GEBAUERS) aerosol 1 Application  1 Application Topical PRN Korrapati, Madhu, MD       polyethylene glycol (MIRALAX  / GLYCOLAX ) packet 17 g  17 g Oral Daily PRN Paudel, Gouty, MD       rosuvastatin  (CRESTOR ) tablet 40 mg  40 mg Oral Daily Paudel, Keshab, MD   40 mg at 05/20/24 1017   sertraline  (ZOLOFT ) tablet 100 mg  100 mg Oral Daily Paudel, Keshab, MD   100 mg at 05/20/24 1017    Allergies as of 05/18/2024 - Review Complete 05/18/2024  Allergen Reaction Noted   Ace inhibitors  07/24/2014   Egg-derived products Diarrhea 12/06/2016   Other  07/24/2014   Prednisone   07/24/2014   Risedronate  07/24/2014   Sulfa antibiotics Itching and Swelling 07/24/2014   Sulfasalazine Other (See Comments) 12/06/2016    Family History  Problem Relation Age of Onset   Cancer Mother        breast   Kidney disease Mother    Breast cancer Mother    Diabetes Mother    Heart disease Mother    Hypertension Mother    Hypertension Father    Heart attack Father    Cancer Father    COPD Father    Arthritis Maternal Grandfather    Kidney disease Maternal Uncle     Social History   Socioeconomic History   Marital status: Married    Spouse name: Not on file   Number of children: Not on file   Years of education: Not on file   Highest education level: Associate degree: academic program  Occupational History   Not on file  Tobacco Use   Smoking status: Never   Smokeless tobacco: Never  Vaping Use   Vaping status: Never Used  Substance and Sexual Activity   Alcohol use: No   Drug use: No   Sexual activity: Not Currently    Birth control/protection:  None  Other Topics Concern   Not on file  Social History Narrative   Lives at home with husband .   Social Drivers of Health   Financial Resource Strain: Medium Risk (04/14/2024)   Received from [facility]   Overall Financial Resource Strain (CARDIA)    Difficulty of Paying Living Expenses: Somewhat hard  Food Insecurity: No Food Insecurity (04/25/2024)   Hunger Vital Sign    Worried About Running Out of Food in the Last Year: Never true    Ran Out of Food in the Last Year: Never true  Transportation Needs: Unmet Transportation Needs (04/25/2024)   PRAPARE - Transportation    Lack of Transportation (Medical): Yes    Lack of Transportation (Non-Medical): Yes  Physical Activity: Insufficiently Active (06/12/2023)   Exercise Vital Sign    Days of Exercise per Week: 7 days    Minutes of Exercise per Session: 20 min  Stress: No Stress Concern Present (06/12/2023)   [employer] of Occupational Health - Occupational Stress Questionnaire    Feeling of Stress : Only a little  Social Connections: Moderately Isolated (04/25/2024)   Social Connection and Isolation Panel  Frequency of Communication with Friends and Family: More than three times a week    Frequency of Social Gatherings with Friends and Family: More than three times a week    Attends Religious Services: Never    Database administrator or Organizations: No    Attends Banker Meetings: Never    Marital Status: Married  Catering manager Violence: Not At Risk (04/25/2024)   Humiliation, Afraid, Rape, and Kick questionnaire    Fear of Current or Ex-Partner: No    Emotionally Abused: No    Physically Abused: No    Sexually Abused: No    Physical Exam: Vital signs in last 24 hours: Temp:  [97.6 F (36.4 C)-98.7 F (37.1 C)] 98.1 F (36.7 C) (08/20 0800) Pulse Rate:  [49-65] 58 (08/20 1037) Resp:  [13-23] 18 (08/20 1037) BP: (122-165)/(38-60) 129/60 (08/20 1037) SpO2:  [96 %-100 %] 100 %  (08/20 1037) FiO2 (%):  [28 %] 28 % (08/19 2243) Weight:  [71.5 kg-72.4 kg] 71.5 kg (08/20 0800) Last BM Date : 05/20/24 General:   Alert,  Well-developed, well-nourished, pleasant. Head:  Normocephalic and atraumatic. Eyes:  Sclera clear, no icterus.   Conjunctiva pink. Ears:  Normal auditory acuity. Nose:  No deformity, discharge,  or lesions. Lungs:  wheeze throughout, 3L [place]. Tachypnea  Heart:  Regular rate and rhythm; no murmurs, clicks, rubs,  or gallops. Abdomen:  Soft, nontender and nondistended. No masses, hepatosplenomegaly or hernias noted. Normal bowel sounds, without guarding, and without rebound.   Extremities:  Without clubbing or edema.  ACCESS: Lt permcath, Lt AVF  Intake/Output from previous day: 08/19 0701 - 08/20 0700 In: 960 [P.O.:960] Out: -  Intake/Output this shift: No intake/output data recorded.  Lab Results: Recent Labs    05/19/24 0414 05/21/24 0511  WBC 8.1 7.0  HGB 8.6* 9.9*  HCT 27.9* 30.7*  PLT 172 175   BMET Recent Labs    05/19/24 0414 05/21/24 0511  NA 142 136  K 3.4* 4.0  CL 105 99  CO2 29 26  GLUCOSE 87 103*  BUN 56* 67*  CREATININE 3.94* 4.01*  CALCIUM  8.7* 9.5   LFT Recent Labs    05/19/24 0414  PROT 6.4*  ALBUMIN 3.4*  AST 22  ALT 17  ALKPHOS 64  BILITOT 0.6   PT/INR No results for input(s): LABPROT, INR in the last 72 hours. Hepatitis Panel Recent Labs    05/18/24 1340  HEPBSAG NON REACTIVE    Studies/Results: No results found.   Assessment/Plan:  83 year old female with history of hypertension, coronary artery disease, congestive heart failure, COPD, obesity, obstructive sleep apnea on CPAP, end-stage renal disease on hemodialysis on Monday Wednesday Friday schedule now comes in with history of worsening shortness of breath with chest x-ray showing congestion.  She was placed on BiPAP.  ESRD on hemodialysis: Patient has been on Monday Wednesday Friday schedule. UF goal 1-1.5 L as tolerated. Next  treatment scheduled for Friday.   ANEMIA with chronic kidney disease: Hemoglobin, 9.9.  Retacrit  4000 units IV with dialysis.  Secondary Hyperparathyroidism: with outpatient labs: PTH 100, phosphorus 4.8, calcium  9.7 on 05/12/24.   Lab Results  Component Value Date   CALCIUM  9.5 05/21/2024   PHOS 4.5 04/30/2024    Calcium  and phosphorus within optimal range.   Hypertension with chronic kidney disease, receiving amlodipine , furosemide , hydralazine , and metoprolol . Blood pressure 123/43 during dialysis.   LOS: 3 [facility] @TODAY @10 :43 AM

## 2024-05-22 ENCOUNTER — Telehealth (HOSPITAL_COMMUNITY): Payer: Self-pay | Admitting: Pharmacy Technician

## 2024-05-22 ENCOUNTER — Other Ambulatory Visit (HOSPITAL_COMMUNITY): Payer: Self-pay

## 2024-05-22 DIAGNOSIS — J9621 Acute and chronic respiratory failure with hypoxia: Secondary | ICD-10-CM | POA: Diagnosis not present

## 2024-05-22 DIAGNOSIS — J9622 Acute and chronic respiratory failure with hypercapnia: Secondary | ICD-10-CM | POA: Diagnosis not present

## 2024-05-22 LAB — GLUCOSE, CAPILLARY
Glucose-Capillary: 101 mg/dL — ABNORMAL HIGH (ref 70–99)
Glucose-Capillary: 134 mg/dL — ABNORMAL HIGH (ref 70–99)
Glucose-Capillary: 94 mg/dL (ref 70–99)
Glucose-Capillary: 122 mg/dL — ABNORMAL HIGH (ref 70–99)

## 2024-05-22 MED ORDER — HEPARIN SODIUM (PORCINE) 1000 UNIT/ML DIALYSIS: 1000.0000 [IU] | INTRAMUSCULAR | Status: DC | PRN

## 2024-05-22 MED ORDER — ALTEPLASE 2 MG IJ SOLR: 2.0000 mg | Freq: Once | INTRAMUSCULAR | Status: DC | PRN

## 2024-05-22 MED ORDER — METOPROLOL TARTRATE 25 MG PO TABS
12.5000 mg | ORAL_TABLET | Freq: Two times a day (BID) | ORAL | Status: DC
  Administered 2024-05-22 – 2024-05-23 (×2): 12.5 mg via ORAL
  Filled 2024-05-22 (×2): qty 1

## 2024-05-22 NOTE — Progress Notes (Addendum)
 Referring Provider: No ref. provider found Primary Care Physician:  Ostwalt, Janna, PA-C Primary Nephrologist:  Dr.   [place] for Consultation: ESRD  HPI: 83 year old female with history of hypertension, coronary artery disease, congestive heart failure, COPD, obesity, obstructive sleep apnea on CPAP, end-stage renal disease on hemodialysis on Monday Wednesday Friday schedule now comes in with history of worsening shortness of breath with chest x-ray showing congestion.  She was placed on BiPAP.  Update: Patient seen sitting at side of bed Alert Room air States she does not have transportation to dialysis tomorrow.    Past Medical History:  Diagnosis Date   Actinic keratosis    Albuminuria    Allergy    Anemia    Arthritis    Asthma    Basal cell carcinoma 04/12/2017   Above right lateral brow. Nodulocystic type. EDC   Cancer (HCC)    skin   Cataract cortical, senile    CHF (congestive heart failure) (HCC)    COPD (chronic obstructive pulmonary disease) (HCC)    Diabetes mellitus without complication (HCC)    GERD (gastroesophageal reflux disease)    Hemorrhoids    History of kidney stones    Hyperlipidemia    Hypertension    Hypothyroidism    Lyme disease    No kidney function    OSA (obstructive sleep apnea)    Osteoporosis    Osteoporosis    Oxygen deficiency July 2025   Reflux esophagitis    Sleep apnea    Steatohepatitis    Steatohepatitis     Past Surgical History:  Procedure Laterality Date   A/V FISTULAGRAM N/A 12/31/2023   Procedure: A/V Fistulagram;  Surgeon: Marea Selinda RAMAN, MD;  Location: [facility];  Service: Cardiovascular;  Laterality: N/A;   ABDOMINAL HYSTERECTOMY     APPENDECTOMY     AV FISTULA PLACEMENT Left 05/19/2022   Procedure: ARTERIOVENOUS (AV) FISTULA CREATION ( BRACHIAL CEPHALIC );  Surgeon: Jama Cordella MATSU, MD;  Location: [facility] ORS;  Service: Vascular;  Laterality: Left;   CARDIAC CATHETERIZATION  1980   [facility]   CARDIAC  CATHETERIZATION  08/13/2014   [facility]. no significant CAD, normal LVEDP.    CATARACT EXTRACTION     CHOLECYSTECTOMY     COLONOSCOPY     COLONOSCOPY WITH PROPOFOL  N/A 12/07/2016   Procedure: COLONOSCOPY WITH PROPOFOL ;  Surgeon: Gladis RAYMOND Mariner, MD;  Location: [facility];  Service: Endoscopy;  Laterality: N/A;   DIALYSIS/PERMA CATHETER INSERTION N/A 12/31/2023   Procedure: DIALYSIS/PERMA CATHETER INSERTION;  Surgeon: Marea Selinda RAMAN, MD;  Location: [facility];  Service: Cardiovascular;  Laterality: N/A;   ESOPHAGOGASTRODUODENOSCOPY     ESOPHAGOGASTRODUODENOSCOPY (EGD) WITH PROPOFOL  N/A 12/07/2016   Procedure: ESOPHAGOGASTRODUODENOSCOPY (EGD) WITH PROPOFOL ;  Surgeon: Gladis RAYMOND Mariner, MD;  Location: [facility] ENDOSCOPY;  Service: Endoscopy;  Laterality: N/A;   ESOPHAGOGASTRODUODENOSCOPY (EGD) WITH PROPOFOL  N/A 01/07/2018   Procedure: ESOPHAGOGASTRODUODENOSCOPY (EGD) WITH PROPOFOL ;  Surgeon: Mariner Gladis RAYMOND, MD;  Location: [facility] ENDOSCOPY;  Service: Endoscopy;  Laterality: N/A;   EYE SURGERY     HEMATOMA EVACUATION Right 08/02/2023   Procedure: EVACUATION HEMATOMA;  Surgeon: Jordis Laneta FALCON, MD;  Location: [facility] ORS;  Service: General;  Laterality: Right;   HEMORRHOID SURGERY     PARTIAL HYSTERECTOMY     THYROIDECTOMY N/A 08/25/2019   Procedure: THYROIDECTOMY EXTRACTION OF SUBTOTAL COMPONENT; PARATHYROID  AUTOTRANSPLANT X1;  Surgeon: Marolyn Nest, MD;  Location: [facility] ORS;  Service: General;  Laterality: N/A;  With Nerve Monitoring(RLN)   TONSILLECTOMY  Prior to Admission medications   Medication Sig Start Date End Date Taking? Authorizing Provider  acetaminophen  (TYLENOL ) 500 MG tablet Take 1,000 mg by mouth every 6 (six) hours as needed for mild pain or moderate pain.    [provider]  albuterol  (VENTOLIN  HFA) 108 (90 Base) MCG/ACT inhaler Inhale 2 puffs into the lungs every 6 (six) hours as needed for wheezing or shortness of breath. 11/14/23   Kasa, Kurian, MD  allopurinol   (ZYLOPRIM ) 100 MG tablet Take 100 mg by mouth daily. 09/17/23   [provider]  amLODipine  (NORVASC ) 5 MG tablet Take 1 tablet (5 mg total) by mouth daily. 08/06/23 08/05/24  Alexander, Natalie, DO  apixaban  (ELIQUIS ) 2.5 MG TABS tablet Take 1 tablet (2.5 mg total) by mouth 2 (two) times daily. 06/04/23 06/03/24  Von Bellis, MD  azelastine  (ASTELIN ) 0.1 % nasal spray Place 2 sprays into both nostrils 2 (two) times daily. Use in each nostril as directed 10/02/23   Tobie Comp B, MD  epoetin  alfa-epbx (RETACRIT ) 4000 UNIT/ML injection Inject 1 mL (4,000 Units total) into the vein every Monday, Wednesday, and Friday at 6 PM. 01/02/24   Awanda City, MD  esomeprazole  (NEXIUM ) 40 MG capsule Take 1 capsule (40 mg total) by mouth daily before breakfast. 11/27/23   Agrawal, Kavita, MD  ferrous sulfate  325 (65 FE) MG tablet Take 325 mg by mouth daily with breakfast.    [provider]  fluticasone  (FLONASE ) 50 MCG/ACT nasal spray Place 2 sprays into both nostrils daily. 10/02/23   Tobie Comp NOVAK, MD  fluticasone  furoate-vilanterol (BREO ELLIPTA ) 200-25 MCG/ACT AEPB Inhale 1 puff into the lungs daily. 11/14/23   Kasa, Kurian, MD  folic acid  (FOLVITE ) 1 MG tablet Take 1 tablet (1 mg total) by mouth daily. 01/01/24   Awanda [place], MD  hydrALAZINE  (APRESOLINE ) 25 MG tablet Take 1 tablet (25 mg total) by mouth 3 (three) times daily. 05/01/24   Jens Durand, MD  ipratropium-albuterol  (DUONEB) 0.5-2.5 (3) MG/3ML SOLN INHALE THE CONTENTS OF ONE (1) VIAL VIA NEBULIZATION EVERY 6 HOURS AS NEEDED FORSHORTNESS OF BREATH 04/23/24   Kasa, Kurian, MD  levothyroxine  (SYNTHROID ) 150 MCG tablet Take 150 mcg by mouth every morning. 02/25/24   [provider]  lidocaine -prilocaine  (EMLA ) cream as directed. Before dialysis 01/02/24   [provider]  loperamide  (IMODIUM ) 2 MG capsule Take 1 capsule (2 mg total) by mouth as needed for diarrhea or loose stools. 08/05/22   Amin, Sumayya, MD  metoprolol  tartrate  (LOPRESSOR ) 50 MG tablet Take 0.5 tablets (25 mg total) by mouth 2 (two) times daily. 05/01/24   Jens Durand, MD  montelukast  (SINGULAIR ) 10 MG tablet Take 1 tablet (10 mg total) by mouth at bedtime. 01/01/24   Awanda City, MD  Multiple Minerals-Vitamins (CALCIUM  & VIT D3 BONE HEALTH PO) Take 1 tablet by mouth daily. 600 mg/ 25 mg    [provider]  nystatin powder Apply 1 Application topically 3 (three) times daily.    [provider]  rosuvastatin  (CRESTOR ) 40 MG tablet Take 40 mg by mouth daily.    [provider]  senna-docusate (SENOKOT-S) 8.6-50 MG tablet Take 2 tablets by mouth at bedtime as needed for mild constipation. 08/06/23   Alexander, Natalie, DO  sertraline  (ZOLOFT ) 100 MG tablet TAKE 1 TABLET BY MOUTH DAILY 05/13/24   Ostwalt, Janna, PA-C  torsemide  (DEMADEX ) 20 MG tablet Take 40 mg by mouth daily.    [provider]  vitamin B-12 (CYANOCOBALAMIN ) 500 MCG  tablet Take 500 mcg by mouth daily.    [provider]    Current Facility-Administered Medications  Medication Dose Route Frequency Provider Last Rate Last Admin   acetaminophen  (TYLENOL ) tablet 650 mg  650 mg Oral Q6H PRN Paudel, Keshab, MD       Or   acetaminophen  (TYLENOL ) suppository 650 mg  650 mg Rectal Q6H PRN Paudel, Nena, MD       allopurinol  (ZYLOPRIM ) tablet 100 mg  100 mg Oral Daily Paudel, Nena, MD   100 mg at 05/22/24 9096   apixaban  (ELIQUIS ) tablet 2.5 mg  2.5 mg Oral BID Paudel, Keshab, MD   2.5 mg at 05/22/24 9096   Chlorhexidine  Gluconate Cloth 2 % PADS 6 each  6 each Topical Q0600 Dominica Brandy, MD       Chlorhexidine  Gluconate Cloth 2 % PADS 6 each  6 each Topical Q0600 Korrapati, Madhu, MD   6 each at 05/22/24 0659   cyanocobalamin  (VITAMIN B12) tablet 500 mcg  500 mcg Oral Daily Paudel, Keshab, MD   500 mcg at 05/22/24 9096   epoetin  alfa-epbx (RETACRIT ) injection 4,000 Units  4,000 Units Intravenous Q M,W,F-1800 Allie Gerhold, NP   4,000 Units at  05/21/24 1229   ferrous sulfate  tablet 325 mg  325 mg Oral Q breakfast Paudel, Keshab, MD   325 mg at 05/22/24 9095   guaiFENesin -dextromethorphan  (ROBITUSSIN DM) 100-10 MG/5ML syrup 10 mL  10 mL Oral Q4H PRN Duncan, Hazel V, MD       hydrALAZINE  (APRESOLINE ) tablet 10 mg  10 mg Oral TID Alexander, Natalie, DO   10 mg at 05/22/24 9095   insulin  aspart (novoLOG ) injection 0-5 Units  0-5 Units Subcutaneous QHS Paudel, Keshab, MD       insulin  aspart (novoLOG ) injection 0-9 Units  0-9 Units Subcutaneous TID WC Paudel, Keshab, MD   2 Units at 05/18/24 1220   ipratropium-albuterol  (DUONEB) 0.5-2.5 (3) MG/3ML nebulizer solution 3 mL  3 mL Nebulization BID Alexander, Natalie, DO   3 mL at 05/22/24 9372   levothyroxine  (SYNTHROID ) tablet 150 mcg  150 mcg Oral q morning Paudel, Keshab, MD   150 mcg at 05/22/24 9382   metoprolol  tartrate (LOPRESSOR ) tablet 25 mg  25 mg Oral BID Paudel, Keshab, MD   25 mg at 05/22/24 9096   montelukast  (SINGULAIR ) tablet 10 mg  10 mg Oral QHS Paudel, Keshab, MD   10 mg at 05/21/24 2208   nitroGLYCERIN  (NITROSTAT ) SL tablet 0.4 mg  0.4 mg Sublingual Q5 min PRN Josette Ade, MD   0.4 mg at 05/19/24 1211   ondansetron  (ZOFRAN ) tablet 4 mg  4 mg Oral Q6H PRN Paudel, Keshab, MD       Or   ondansetron  (ZOFRAN ) injection 4 mg  4 mg Intravenous Q6H PRN Paudel, Keshab, MD       polyethylene glycol (MIRALAX  / GLYCOLAX ) packet 17 g  17 g Oral Daily PRN Paudel, Nena, MD       rosuvastatin  (CRESTOR ) tablet 40 mg  40 mg Oral Daily Paudel, Keshab, MD   40 mg at 05/22/24 9096   sertraline  (ZOLOFT ) tablet 100 mg  100 mg Oral Daily Paudel, Nena, MD   100 mg at 05/22/24 9096   torsemide  (DEMADEX ) tablet 40 mg  40 mg Oral Daily Alexander, Natalie, DO   40 mg at 05/22/24 9096    Allergies as of 05/18/2024 - Review Complete 05/18/2024  Allergen Reaction Noted   Ace inhibitors  07/24/2014   Egg-derived products  Diarrhea 12/06/2016   Other  07/24/2014   Prednisone   07/24/2014    Risedronate  07/24/2014   Sulfa antibiotics Itching and Swelling 07/24/2014   Sulfasalazine Other (See Comments) 12/06/2016    Family History  Problem Relation Age of Onset   Cancer Mother        breast   Kidney disease Mother    Breast cancer Mother    Diabetes Mother    Heart disease Mother    Hypertension Mother    Hypertension Father    Heart attack Father    Cancer Father    COPD Father    Arthritis Maternal Grandfather    Kidney disease Maternal Uncle     Social History   Socioeconomic History   Marital status: Married    Spouse name: Not on file   Number of children: Not on file   Years of education: Not on file   Highest education level: Associate degree: academic program  Occupational History   Not on file  Tobacco Use   Smoking status: Never   Smokeless tobacco: Never  Vaping Use   Vaping status: Never Used  Substance and Sexual Activity   Alcohol use: No   Drug use: No   Sexual activity: Not Currently    Birth control/protection: None  Other Topics Concern   Not on file  Social History Narrative   Lives at home with husband .   Social Drivers of Health   Financial Resource Strain: Medium Risk (04/14/2024)   Received from [facility]   Overall Financial Resource Strain (CARDIA)    Difficulty of Paying Living Expenses: Somewhat hard  Food Insecurity: No Food Insecurity (05/21/2024)   Hunger Vital Sign    Worried About Running Out of Food in the Last Year: Never true    Ran Out of Food in the Last Year: Never true  Transportation Needs: No Transportation Needs (05/21/2024)   PRAPARE - Administrator, Civil Service (Medical): No    Lack of Transportation (Non-Medical): No  Recent Concern: Transportation Needs - Unmet Transportation Needs (04/25/2024)   PRAPARE - Transportation    Lack of Transportation (Medical): Yes    Lack of Transportation (Non-Medical): Yes  Physical Activity: Insufficiently Active (06/12/2023)    Exercise Vital Sign    Days of Exercise per Week: 7 days    Minutes of Exercise per Session: 20 min  Stress: No Stress Concern Present (06/12/2023)   [employer] of Occupational Health - Occupational Stress Questionnaire    Feeling of Stress : Only a little  Social Connections: Unknown (05/21/2024)   Social Connection and Isolation Panel    Frequency of Communication with Friends and Family: More than three times a week    Frequency of Social Gatherings with Friends and Family: More than three times a week    Attends Religious Services: More than 4 times per year    Active Member of [employer] or Organizations: Yes    Attends Banker Meetings: Not on file    Marital Status: Not on file  Recent Concern: Social Connections - Moderately Isolated (04/25/2024)   Social Connection and Isolation Panel    Frequency of Communication with Friends and Family: More than three times a week    Frequency of Social Gatherings with Friends and Family: More than three times a week    Attends Religious Services: Never    Database administrator or Organizations: No    Attends Banker  Meetings: Never    Marital Status: Married  Catering manager Violence: Not At Risk (05/21/2024)   Humiliation, Afraid, Rape, and Kick questionnaire    Fear of Current or Ex-Partner: No    Emotionally Abused: No    Physically Abused: No    Sexually Abused: No    Physical Exam: Vital signs in last 24 hours: Temp:  [97.7 F (36.5 C)-98.4 F (36.9 C)] 98 F (36.7 C) (08/21 0920) Pulse Rate:  [53-67] 58 (08/21 0920) Resp:  [18-21] 20 (08/21 0347) BP: (111-149)/(40-50) 125/50 (08/21 0920) SpO2:  [96 %-100 %] 97 % (08/21 0920) FiO2 (%):  [28 %] 28 % (08/20 2315) Weight:  [74.6 kg] 74.6 kg (08/21 0500) Last BM Date : 05/20/24 General:   Alert,  Well-developed, well-nourished, pleasant. Head:  Normocephalic and atraumatic. Eyes:  Sclera clear, no icterus.   Conjunctiva pink. Ears:  Normal auditory  acuity. Nose:  No deformity, discharge,  or lesions. Lungs:  wheeze throughout, 3L [place]. Tachypnea  Heart:  Regular rate and rhythm; no murmurs, clicks, rubs,  or gallops. Abdomen:  Soft, nontender and nondistended. No masses, hepatosplenomegaly or hernias noted. Normal bowel sounds, without guarding, and without rebound.   Extremities:  Without clubbing or edema.  ACCESS: Lt permcath, Lt AVF  Intake/Output from previous day: 08/20 0701 - 08/21 0700 In: 300 [P.O.:300] Out: 1200  Intake/Output this shift: Total I/O In: 220 [P.O.:220] Out: -   Lab Results: Recent Labs    05/21/24 0511  WBC 7.0  HGB 9.9*  HCT 30.7*  PLT 175   BMET Recent Labs    05/21/24 0511  NA 136  K 4.0  CL 99  CO2 26  GLUCOSE 103*  BUN 67*  CREATININE 4.01*  CALCIUM  9.5   LFT No results for input(s): PROT, ALBUMIN, AST, ALT, ALKPHOS, BILITOT, BILIDIR, IBILI in the last 72 hours.  PT/INR No results for input(s): LABPROT, INR in the last 72 hours. Hepatitis Panel No results for input(s): HEPBSAG, HCVAB, HEPAIGM, HEPBIGM in the last 72 hours.   Studies/Results: No results found.   Assessment/Plan:  83 year old female with history of hypertension, coronary artery disease, congestive heart failure, COPD, obesity, obstructive sleep apnea on CPAP, end-stage renal disease on hemodialysis on Monday Wednesday Friday schedule now comes in with history of worsening shortness of breath with chest x-ray showing congestion.  She was placed on BiPAP.  ESRD on hemodialysis: Patient has been on Monday Wednesday Friday schedule. Received dialysis yesterday, UF 1.2L achieved.  Next treatment scheduled for Friday. Will establish new dry weight at 71.5kg.  ANEMIA with chronic kidney disease: Hemoglobin, 9.9, stable.  Retacrit  4000 units IV with dialysis.  Secondary Hyperparathyroidism: with outpatient labs: PTH 100, phosphorus 4.8, calcium  9.7 on 05/12/24.   Lab Results  Component  Value Date   CALCIUM  9.5 05/21/2024   PHOS 4.5 04/30/2024    Will continue to monitor bone minerals.   Hypertension with chronic kidney disease, receiving amlodipine , furosemide , hydralazine , and metoprolol . Blood pressure acceptable.    LOS: 4 [facility] @TODAY @12 :25 PM

## 2024-05-22 NOTE — Progress Notes (Signed)
 PROGRESS NOTE    [name]   FMW:[id number] DOB: 12/17/1940  DOA: 05/18/2024 Date of Service: 05/22/24 which is hospital day 4  PCP: Ostwalt, Janna, [facility] course / significant events:   HPI: 83 y.o. female with medical history significant for COPD on home oxygen 3.5 L/min via nasal cannula, CHF, end-stage renal disease on HD MWF. She presented to ED via EMS for shortness of breath for 24 hours.  Patient stated that she had dialysis Friday 08/15 where her ultrafiltrate was not removed.  She increased her home oxygen without improvement in her saturation.  EMS found patient to be 85% with minimal improvement after 6 L of nasal cannula.  Patient was placed on CPAP during transport. Patient stated she ran out of her nebulizers.  She felt that she did not have enough fluid taken off on Friday.  08/18.  Upon arrival to the ED, patient is found to be hypoxemic requiring 6 L of oxygen by nasal cannula still not saturating well then changed to CPAP and BiPAP in the ED. she was also given Lasix  40 mg x 1 and Solu-Medrol  125 mg x 1.  She had leukocytosis at 12.  Chest x-ray showed interstitial effusion.  Hospitalist service was consulted for evaluation for admission for acute hypoxemic respiratory failure likely due to CHF exacerbation. 08/19.  Patient states she did not sleep last night.  Still feeling short of breath.  Nephrology will dialyze again tomorrow. 08/20: dialysis again today. Stopping iv lasix , reduced antihypertensives. Still needing O2, ordered walk test in case needing to update orders to O2 2L at home continuously  08/21: low BP, adjusting meds      Consultants:  nephrology  Procedures/Surgeries: none      ASSESSMENT & PLAN:   Acute on chronic respiratory failure with hypoxia and hypercapnia  Patient was in respiratory distress on presentation required BiPAP.   Does not qualify for BiPAP at home. Okay to go back on her CPAP at home.   See if we can get off  oxygen during the day - ordered walk test in case needing to update orders to O2 2L at home continuously    Acute on chronic diastolic CHF (congestive heart failure) Patient had dialysis on admission and 08/19, 08/20 EF 55 to 60%. Dialysis today  D/c'ed IV Lasix  twice daily.  Restart home torsemide  yesterday and doing well on this    Paroxysmal atrial fibrillation  On Toprol  for rate control and Eliquis  for anticoagulation - lowered dose d/t low BP   COPD (chronic obstructive pulmonary disease) no apparent exacerbation  Patient wears oxygen at night.  Patient states that she did not fill her medication for nebulizers because it was way too expensive.   Transitioning from inhaler/ellipta devices and going to nebulized medications for ease of use for patient and pt reports better effectiveness in symptoms relief w/ nebs as follows: triple therapy for COPD - ICS w/ budesonide (Pulmicort) and SAMA-SABA w/ ipratropium-albuterol  (DuoNeb)   HLD (hyperlipidemia) On Crestor    Essential hypertension Was on Norvasc , Lasix , hydralazine , Lopressor  - d/c norvasc , hydralazine . Reduced lopressor   Adjusted meds down d/t pt concern for low BP and true low diastolic    Iron deficiency anemia Hemoglobin 8.6.   ferrous sulfate .   Hypothyroidism Synthroid    Type 2 diabetes mellitus with obesity  sliding scale insulin .       Overweight based on BMI: Body mass index is 28.83 kg/m.SABRA Significantly low or high BMI is associated with higher  medical risk.  Underweight - under 18  overweight - 25 to 29 obese - 30 or more Class 1 obesity: BMI of 30.0 to 34 Class 2 obesity: BMI of 35.0 to 39 Class 3 obesity: BMI of 40.0 to 49 Super Morbid Obesity: BMI 50-59 Super-super Morbid Obesity: BMI 60+ Healthy nutrition and physical activity advised as adjunct to other disease management and risk reduction treatments    DVT prophylaxis: heparin  IV fluids: no continuous IV fluids  Nutrition: renal Central  lines / other devices: permcath  Code Status: FULL CODE ACP documentation reviewed:  none on file in [facility]  TOC needs: [facility] Medical barriers to dispo: hypotension. Expected medical readiness for discharge tomorrow.                 Subjective / Brief ROS:  Patient reports feeling okay today, tired d/t low BP and concerned abotu BP  Denies CP/SOB.  Pain controlled.  Denies new weakness.  Tolerating diet.    Family Communication: none at thist ime     Objective Findings:  Vitals:   05/22/24 0500 05/22/24 0628 05/22/24 0920 05/22/24 1310  BP:   (!) 125/50 (!) 103/36  Pulse:   (!) 58 (!) 54  Resp:      Temp:   98 F (36.7 C) 98.6 F (37 C)  TempSrc:   Oral   SpO2:  97% 97% 96%  Weight: 74.6 kg     Height:        Intake/Output Summary (Last 24 hours) at 05/22/2024 1743 Last data filed at 05/22/2024 1416 Gross per 24 hour  Intake 640 ml  Output --  Net 640 ml   Filed Weights   05/21/24 0800 05/21/24 1206 05/22/24 0500  Weight: 71.5 kg 71.5 kg 74.6 kg    Examination:  Physical Exam Cardiovascular:     Rate and Rhythm: Normal rate and regular rhythm.  Pulmonary:     Effort: Pulmonary effort is normal.     Breath sounds: Normal breath sounds.  Neurological:     General: No focal deficit present.     Mental Status: She is alert and oriented to person, place, and time.  Psychiatric:        Mood and Affect: Mood normal.        Behavior: Behavior normal.          Scheduled Medications:   allopurinol   100 mg Oral Daily   apixaban   2.5 mg Oral BID   Chlorhexidine  Gluconate Cloth  6 each Topical Q0600   Chlorhexidine  Gluconate Cloth  6 each Topical Q0600   cyanocobalamin   500 mcg Oral Daily   epoetin  alfa-epbx (RETACRIT ) injection  4,000 Units Intravenous Q M,W,F-1800   ferrous sulfate   325 mg Oral Q breakfast   insulin  aspart  0-5 Units Subcutaneous QHS   insulin  aspart  0-9 Units Subcutaneous TID WC   ipratropium-albuterol   3 mL Nebulization  BID   levothyroxine   150 mcg Oral q morning   metoprolol  tartrate  12.5 mg Oral BID   montelukast   10 mg Oral QHS   rosuvastatin   40 mg Oral Daily   sertraline   100 mg Oral Daily   torsemide   40 mg Oral Daily    Continuous Infusions:    PRN Medications:  acetaminophen  **OR** acetaminophen , alteplase , guaiFENesin -dextromethorphan , heparin , nitroGLYCERIN , ondansetron  **OR** ondansetron  (ZOFRAN ) IV, polyethylene glycol  Antimicrobials from admission:  Anti-infectives (From admission, onward)    None           Data Reviewed:  I have personally reviewed the following...  CBC: Recent Labs  Lab 05/18/24 0500 05/19/24 0414 05/21/24 0511  WBC 12.0* 8.1 7.0  HGB 10.5* 8.6* 9.9*  HCT 35.0* 27.9* 30.7*  MCV 96.2 93.0 90.0  PLT 202 172 175   Basic Metabolic Panel: Recent Labs  Lab 05/18/24 0500 05/19/24 0414 05/21/24 0511  NA 140 142 136  K 4.2 3.4* 4.0  CL 103 105 99  CO2 25 29 26   GLUCOSE 183* 87 103*  BUN 36* 56* 67*  CREATININE 3.16* 3.94* 4.01*  CALCIUM  9.3 8.7* 9.5   GFR: Estimated Creatinine Clearance: 10.1 mL/min (A) (by C-G formula based on SCr of 4.01 mg/dL (H)). Liver Function Tests: Recent Labs  Lab 05/18/24 0500 05/19/24 0414  AST 38 22  ALT 22 17  ALKPHOS 108 64  BILITOT 1.0 0.6  PROT 7.6 6.4*  ALBUMIN 4.0 3.4*   No results for input(s): LIPASE, AMYLASE in the last 168 hours. No results for input(s): AMMONIA in the last 168 hours. Coagulation Profile: No results for input(s): INR, PROTIME in the last 168 hours. Cardiac Enzymes: No results for input(s): CKTOTAL, CKMB, CKMBINDEX, TROPONINI in the last 168 hours. BNP (last 3 results) No results for input(s): PROBNP in the last 8760 hours. HbA1C: No results for input(s): HGBA1C in the last 72 hours. CBG: Recent Labs  Lab 05/21/24 1536 05/21/24 2037 05/22/24 0916 05/22/24 1306 05/22/24 1642  GLUCAP 121* 112* 101* 134* 94   Lipid Profile: No results for  input(s): CHOL, HDL, LDLCALC, TRIG, CHOLHDL, LDLDIRECT in the last 72 hours. Thyroid  Function Tests: No results for input(s): TSH, T4TOTAL, FREET4, T3FREE, THYROIDAB in the last 72 hours. Anemia Panel: No results for input(s): VITAMINB12, FOLATE, FERRITIN, TIBC, IRON, RETICCTPCT in the last 72 hours. Most Recent Urinalysis On File:     Component Value Date/Time   COLORURINE YELLOW (A) 09/24/2023 1050   APPEARANCEUR CLOUDY (A) 09/24/2023 1050   APPEARANCEUR Clear 09/17/2018 1028   LABSPEC 1.012 09/24/2023 1050   LABSPEC 1.013 06/20/2012 1508   PHURINE 5.0 09/24/2023 1050   GLUCOSEU NEGATIVE 09/24/2023 1050   GLUCOSEU Negative 06/20/2012 1508   HGBUR SMALL (A) 09/24/2023 1050   BILIRUBINUR NEGATIVE 09/24/2023 1050   BILIRUBINUR Negative 09/17/2018 1028   BILIRUBINUR Negative 06/20/2012 1508   KETONESUR NEGATIVE 09/24/2023 1050   PROTEINUR 100 (A) 09/24/2023 1050   NITRITE NEGATIVE 09/24/2023 1050   LEUKOCYTESUR LARGE (A) 09/24/2023 1050   LEUKOCYTESUR Negative 06/20/2012 1508   Sepsis Labs: @LABRCNTIP (procalcitonin:4,lacticidven:4) Microbiology: Recent Results (from the past 240 hours)  Resp panel by RT-PCR (RSV, Flu A&B, Covid) Anterior Nasal Swab     Status: None   Collection Time: 05/18/24  4:45 AM   Specimen: Anterior Nasal Swab  Result Value Ref Range Status   SARS Coronavirus 2 by RT PCR NEGATIVE NEGATIVE Final    Comment: (NOTE) SARS-CoV-2 target nucleic acids are NOT DETECTED.  The SARS-CoV-2 RNA is generally detectable in upper respiratory specimens during the acute phase of infection. The lowest concentration of SARS-CoV-2 viral copies this assay can detect is 138 copies/mL. A negative result does not preclude SARS-Cov-2 infection and should not be used as the sole basis for treatment or other patient management decisions. A negative result may occur with  improper specimen collection/handling, submission of specimen other than  nasopharyngeal swab, presence of viral mutation(s) within the areas targeted by this assay, and inadequate number of viral copies(<138 copies/mL). A negative result must be combined with clinical observations, patient history, and  epidemiological information. The expected result is Negative.  Fact Sheet for Patients:  BloggerCourse.com  Fact Sheet for Healthcare Providers:  SeriousBroker.it  This test is no t yet approved or cleared by the [place]  FDA and  has been authorized for detection and/or diagnosis of SARS-CoV-2 by FDA under an Emergency Use Authorization (EUA). This EUA will remain  in effect (meaning this test can be used) for the duration of the COVID-19 declaration under Section 564(b)(1) of the Act, 21 U.S.C.section 360bbb-3(b)(1), unless the authorization is terminated  or revoked sooner.       Influenza A by PCR NEGATIVE NEGATIVE Final   Influenza B by PCR NEGATIVE NEGATIVE Final    Comment: (NOTE) The Xpert Xpress SARS-CoV-2/FLU/RSV plus assay is intended as an aid in the diagnosis of influenza from Nasopharyngeal swab specimens and should not be used as a sole basis for treatment. Nasal washings and aspirates are unacceptable for Xpert Xpress SARS-CoV-2/FLU/RSV testing.  Fact Sheet for Patients: BloggerCourse.com  Fact Sheet for Healthcare Providers: SeriousBroker.it  This test is not yet approved or cleared by the [place]  FDA and has been authorized for detection and/or diagnosis of SARS-CoV-2 by FDA under an Emergency Use Authorization (EUA). This EUA will remain in effect (meaning this test can be used) for the duration of the COVID-19 declaration under Section 564(b)(1) of the Act, 21 U.S.C. section 360bbb-3(b)(1), unless the authorization is terminated or revoked.     Resp Syncytial Virus by PCR NEGATIVE NEGATIVE Final    Comment:  (NOTE) Fact Sheet for Patients: BloggerCourse.com  Fact Sheet for Healthcare Providers: SeriousBroker.it  This test is not yet approved or cleared by the [place]  FDA and has been authorized for detection and/or diagnosis of SARS-CoV-2 by FDA under an Emergency Use Authorization (EUA). This EUA will remain in effect (meaning this test can be used) for the duration of the COVID-19 declaration under Section 564(b)(1) of the Act, 21 U.S.C. section 360bbb-3(b)(1), unless the authorization is terminated or revoked.  Performed at [facility], [address] [place], [place] [address]       Radiology Studies last 3 days: No results found.        Nirav Sweda, DO [facility] 05/22/2024, 5:43 PM    Dictation software may have been used to generate the above note. Typos may occur and escape review in typed/dictated notes. Please contact Dr Marsa directly for clarity if needed.  Staff may message me via secure chat in Epic  but this may not receive an immediate response,  please page me for urgent matters!  If 7PM-7AM, please contact night coverage [email or web address]

## 2024-05-22 NOTE — Progress Notes (Signed)
 Physical Therapy Treatment Patient Details Name: ROSAISELA JAMROZ MRN: [id number] DOB: [birth year] Today's Date: 05/22/2024   History of Present Illness Pt is a pleasant 83 y.o. female with medical history significant for COPD on home oxygen 3.5 L in via nasal cannula, CHF, ESRD on dialysis MWF who presented to ED via EMS for shortness of breath. MD assessment includes: acute hypoxemic respiratory failure likely due to CHF exacerbation.    PT Comments  Pt was pleasant and motivated to participate during the session and put forth good effort throughout. Pt found sitting at EOB on room air with SpO2 94% and HR 64 bpm.  Pt taken through graded ambulation distances for O2 qualification test on room air per MD request.  After amb with RW x 40 ft SpO2 92%, HR 67 bpm, after 100 ft 90% and 66 bpm, and after 150 ft 92% and 70 bpm all with no adverse symptoms noted.  Pt was steady with ambulation with only minimal lean on the RW for support.  Pt will benefit from continued PT services upon discharge to safely address deficits listed in patient problem list for decreased caregiver assistance and eventual return to PLOF.      If plan is discharge home, recommend the following: Help with stairs or ramp for entrance;Assist for transportation;Assistance with cooking/housework;A little help with walking and/or transfers   Can travel by private vehicle        Equipment Recommendations  Rollator (4 wheels)    Recommendations for Other Services       Precautions / Restrictions Precautions Precautions: Fall Restrictions Weight Bearing Restrictions Per Provider Order: No     Mobility  Bed Mobility               General bed mobility comments: NT, pt in sitting pre/post session    Transfers Overall transfer level: Modified independent Equipment used: None               General transfer comment: Good eccentric and concentric control and stability with no cues needed for sequencing     Ambulation/Gait Ambulation/Gait assistance: Supervision Gait Distance (Feet): 40 Feet Assistive device: Rolling walker (2 wheels) Gait Pattern/deviations: Step-through pattern, Decreased step length - right, Decreased step length - left Gait velocity: decreased     General Gait Details: Pt taken through graded ambulation distances with SpO2 remaining >/= 90% on room air throughout per above, no adverse symptoms   Stairs             Wheelchair Mobility     Tilt Bed    Modified Rankin (Stroke Patients Only)       Balance Overall balance assessment: Needs assistance Sitting-balance support: Feet supported Sitting balance-Leahy Scale: Normal     Standing balance support: Bilateral upper extremity supported, During functional activity Standing balance-Leahy Scale: Good                              Communication Communication Communication: No apparent difficulties  Cognition Arousal: Alert Behavior During Therapy: WFL for tasks assessed/performed   PT - Cognitive impairments: No apparent impairments                         Following commands: Intact      Cueing Cueing Techniques: Verbal cues  Exercises Total Joint Exercises Long Arc Quad: AROM, Strengthening, Both, 10 reps Knee Flexion: AROM, Strengthening, Both, 10 reps Marching in Standing:  Strengthening, Both, 10 reps, Seated    General Comments        Pertinent Vitals/Pain Pain Assessment Pain Assessment: No/denies pain    Home Living                          Prior Function            PT Goals (current goals can now be found in the care plan section) Progress towards PT goals: Progressing toward goals    Frequency    Min 2X/week      PT Plan      Co-evaluation              AM-PAC PT 6 Clicks Mobility   Outcome Measure  Help needed turning from your back to your side while in a flat bed without using bedrails?: None Help needed moving  from lying on your back to sitting on the side of a flat bed without using bedrails?: None Help needed moving to and from a bed to a chair (including a wheelchair)?: None Help needed standing up from a chair using your arms (e.g., wheelchair or bedside chair)?: None Help needed to walk in hospital room?: A Little Help needed climbing 3-5 steps with a railing? : A Little 6 Click Score: 22    End of Session Equipment Utilized During Treatment: Gait belt Activity Tolerance: Patient tolerated treatment well Patient left: in bed;with call bell/phone within reach;Other (comment) (Pt left sitting at EOB as found at onset of session per pt request) Nurse Communication: Mobility status PT Visit Diagnosis: Difficulty in walking, not elsewhere classified (R26.2)     Time: 9051-8992 PT Time Calculation (min) (ACUTE ONLY): 19 min  Charges:    $Therapeutic Exercise: 8-22 mins PT General Charges $$ ACUTE PT VISIT: 1 Visit                     D. Scott Fielding Mault PT, DPT 05/22/24, 11:26 AM

## 2024-05-22 NOTE — TOC Progression Note (Addendum)
 Transition of Care [facility]) - Progression Note    Patient Details  Name: [name] MRN: [id number] Date of Birth: 11/04/1940  Transition of [facility]) CM/SW Contact  Lauraine JAYSON Carpen, LCSW Phone Number: 05/22/2024, 10:55 AM  Clinical Narrative:  [employer] liaison is checking to see what they need for nebulizer medication orders. Per attending physician, NIV can be coordinated with pulmonologist if needed.   2:16 pm: Nebulizer medication form filled out by attending physician. CSW sent to [employer] liaison.  Expected Discharge Plan: Home w Home Health Services Barriers to Discharge: Continued Medical Work up               Expected Discharge Plan and Services     Post Acute Care Choice: Resumption of Svcs/PTA Provider Living arrangements for the past 2 months: Single Family Home                           HH Arranged: RN, PT, OT [facility] Agency: [employer] [facility] Date [facility] Agency Contacted: 05/20/24   Representative spoke with at [facility] Agency: [place]   Social Drivers of Health (SDOH) Interventions SDOH Screenings   Food Insecurity: No Food Insecurity (05/21/2024)  Housing: Unknown (05/21/2024)  Recent Concern: Housing - High Risk (04/25/2024)  Transportation Needs: No Transportation Needs (05/21/2024)  Recent Concern: Transportation Needs - Unmet Transportation Needs (04/25/2024)  Utilities: Not At Risk (05/21/2024)  Depression (PHQ2-9): Low Risk  (05/08/2024)  Recent Concern: Depression (PHQ2-9) - Medium Risk (02/15/2024)  Financial Resource Strain: Medium Risk (04/14/2024)   Received from [facility]  Physical Activity: Insufficiently Active (06/12/2023)  Social Connections: Unknown (05/21/2024)  Recent Concern: Social Connections - Moderately Isolated (04/25/2024)  Stress: No Stress Concern Present (06/12/2023)  Tobacco Use: Low Risk  (05/18/2024)    Readmission Risk Interventions    05/20/2024    3:15 PM 07/30/2023   10:37 AM 04/19/2023    1:10 PM   Readmission Risk Prevention Plan  Transportation Screening Complete Complete Complete  Medication Review Oceanographer) Complete Complete Complete  PCP or Specialist appointment within 3-5 days of discharge Complete Complete   HRI or Home Care Consult Complete Complete Complete  SW Recovery Care/Counseling Consult Complete Complete Complete  Palliative Care Screening Not Applicable Not Applicable Not Applicable  Skilled Nursing Facility Not Applicable Not Applicable Not Applicable

## 2024-05-22 NOTE — Telephone Encounter (Signed)
 Patient Product/process development scientist completed.    The patient is insured through [employer]. Patient has Medicare and is not eligible for a copay card, but may be able to apply for patient assistance or Medicare RX Payment Plan (Patient Must reach out to their plan, if eligible for payment plan), if available.    Ran test claim for [employer] Ell[employer]  and the current 30 day co-pay is $71.35.  Ran test claim for [facility] and Not on Formulary  This test claim was processed through [employer]- copay amounts may vary at other pharmacies due to [employer], or as the patient moves through the different stages of their insurance plan.     Reyes Sharps, CPHT Pharmacy Technician III Certified Patient Advocate [facility] Patient Advocate Team Direct Number: [phone]  Fax: [phone]

## 2024-05-22 NOTE — Plan of Care (Signed)

## 2024-05-23 ENCOUNTER — Other Ambulatory Visit: Payer: Self-pay

## 2024-05-23 DIAGNOSIS — J9622 Acute and chronic respiratory failure with hypercapnia: Secondary | ICD-10-CM | POA: Diagnosis not present

## 2024-05-23 DIAGNOSIS — J9621 Acute and chronic respiratory failure with hypoxia: Secondary | ICD-10-CM | POA: Diagnosis not present

## 2024-05-23 LAB — RENAL FUNCTION PANEL
Albumin: 3.7 g/dL (ref 3.5–5.0)
Anion gap: 15 (ref 5–15)
BUN: 78 mg/dL — ABNORMAL HIGH (ref 8–23)
CO2: 23 mmol/L (ref 22–32)
Calcium: 9.1 mg/dL (ref 8.9–10.3)
Chloride: 96 mmol/L — ABNORMAL LOW (ref 98–111)
Creatinine, Ser: 4.06 mg/dL — ABNORMAL HIGH (ref 0.44–1.00)
GFR, Estimated: 10 mL/min — ABNORMAL LOW (ref >60)
Glucose, Bld: 92 mg/dL (ref 70–99)
Phosphorus: 6.5 mg/dL — ABNORMAL HIGH (ref 2.5–4.6)
Potassium: 4.1 mmol/L (ref 3.5–5.1)
Sodium: 134 mmol/L — ABNORMAL LOW (ref 135–145)

## 2024-05-23 LAB — CBC
HCT: 31.5 % — ABNORMAL LOW (ref 36.0–46.0)
Hemoglobin: 10.2 g/dL — ABNORMAL LOW (ref 12.0–15.0)
MCH: 28.9 pg (ref 26.0–34.0)
MCHC: 32.4 g/dL (ref 30.0–36.0)
MCV: 89.2 fL (ref 80.0–100.0)
Platelets: 190 K/uL (ref 150–400)
RBC: 3.53 MIL/uL — ABNORMAL LOW (ref 3.87–5.11)
RDW: 15.7 % — ABNORMAL HIGH (ref 11.5–15.5)
WBC: 6.3 K/uL (ref 4.0–10.5)
nRBC: 0 % (ref 0.0–0.2)

## 2024-05-23 MED ORDER — METOPROLOL TARTRATE 25 MG PO TABS
12.5000 mg | ORAL_TABLET | Freq: Two times a day (BID) | ORAL | 0 refills | Status: DC
  Filled 2024-05-23: qty 30, 30d supply, fill #0

## 2024-05-23 MED ORDER — HEPARIN SODIUM (PORCINE) 1000 UNIT/ML IJ SOLN
INTRAMUSCULAR | Status: AC
  Filled 2024-05-23: qty 5

## 2024-05-23 MED ORDER — EPOETIN ALFA-EPBX 4000 UNIT/ML IJ SOLN
INTRAMUSCULAR | Status: AC
  Filled 2024-05-23: qty 1

## 2024-05-23 NOTE — Discharge Summary (Signed)
 Physician Discharge Summary   Patient: [name] MRN: [id number]  DOB: May 21, 1941   Admit:     Date of Admission: 05/18/2024 Admitted from: home   Discharge: Date of discharge: 05/23/24 Disposition: Home health Condition at discharge: good  CODE STATUS: FULL CODE     Discharge Physician: Laneta Blunt, DO [facility]     PCP: Dineen Channel, PA-C  Recommendations for Outpatient Follow-up:  Follow up with PCP Dineen Channel, PA-C in 1-2 weeks hospital follow up    Discharge Instructions     Diet - low sodium heart healthy   Complete by: As directed    Increase activity slowly   Complete by: As directed          Discharge Diagnoses: Principal Problem:   Acute on chronic respiratory failure with hypoxia and hypercapnia (HCC) Active Problems:   Acute on chronic diastolic CHF (congestive heart failure) (HCC)   Paroxysmal atrial fibrillation (HCC)   COPD (chronic obstructive pulmonary disease) (HCC)   HLD (hyperlipidemia)   Essential hypertension   Type 2 diabetes mellitus with obesity (HCC)   Hypothyroidism   Iron deficiency anemia       Hospital course / significant events:   HPI: 83 y.o. female with medical history significant for COPD on home oxygen 3.5 L/min via nasal cannula, CHF, end-stage renal disease on HD MWF. She presented to ED via EMS for shortness of breath for 24 hours.  Patient stated that she had dialysis Friday 08/15 where her ultrafiltrate was not removed.  She increased her home oxygen without improvement in her saturation.  EMS found patient to be 85% with minimal improvement after 6 L of nasal cannula.  Patient was placed on CPAP during transport. Patient stated she ran out of her nebulizers.  She felt that she did not have enough fluid taken off on Friday.  08/18.  Upon arrival to the ED, patient is found to be hypoxemic requiring 6 L of oxygen by nasal cannula still not saturating well then changed to CPAP and BiPAP in  the ED. she was also given Lasix  40 mg x 1 and Solu-Medrol  125 mg x 1.  She had leukocytosis at 12.  Chest x-ray showed interstitial effusion.  Hospitalist service was consulted for evaluation for admission for acute hypoxemic respiratory failure likely due to CHF exacerbation. 08/19.  Patient states she did not sleep last night.  Still feeling short of breath.  Nephrology will dialyze again tomorrow. 08/20: dialysis again today. Stopping iv lasix , reduced antihypertensives. Still needing O2, ordered walk test in case needing to update orders to O2 2L at home continuously  08/21: low BP, adjusting meds  08/22: BP improved / baseline. HD today no concerns after that, pt stable for discharge      Consultants:  nephrology  Procedures/Surgeries: none      ASSESSMENT & PLAN:   Acute on chronic respiratory failure with hypoxia and hypercapnia  Patient was in respiratory distress on presentation required BiPAP.   Does not qualify for BiPAP at home. Okay to go back on her CPAP at home.     Acute on chronic diastolic CHF (congestive heart failure) Patient had dialysis on admission and 08/19, 08/20 EF 55 to 60%. Dialysis today  restarted home torsemide  and doing well on this    Paroxysmal atrial fibrillation  On Toprol  for rate control and Eliquis  for anticoagulation - lowered dose d/t low BP   COPD (chronic obstructive pulmonary disease) no apparent exacerbation  Patient wears oxygen at night.  Patient states that she did not fill her medication for nebulizers because it was way too expensive.   Transitioning from inhaler/ellipta devices and going to nebulized medications for ease of use for patient and pt reports better effectiveness in symptoms relief w/ nebs as follows: triple therapy for COPD - ICS w/ budesonide (Pulmicort) and SAMA-SABA w/ ipratropium-albuterol  (DuoNeb)   HLD (hyperlipidemia) On Crestor    Essential hypertension Was on Norvasc , Lasix , hydralazine , Lopressor  -  d/c norvasc , hydralazine . Reduced lopressor   Adjusted meds down d/t pt concern for low BP and true low diastolic    Iron deficiency anemia ferrous sulfate .   Hypothyroidism Synthroid    Type 2 diabetes mellitus with obesity  sliding scale insulin .       Overweight based on BMI: Body mass index is 28.83 kg/m.SABRA Significantly low or high BMI is associated with higher medical risk.  Underweight - under 18  overweight - 25 to 29 obese - 30 or more Class 1 obesity: BMI of 30.0 to 34 Class 2 obesity: BMI of 35.0 to 39 Class 3 obesity: BMI of 40.0 to 49 Super Morbid Obesity: BMI 50-59 Super-super Morbid Obesity: BMI 60+ Healthy nutrition and physical activity advised as adjunct to other disease management and risk reduction treatments             Discharge Instructions  Allergies as of 05/23/2024       Reactions   Ace Inhibitors    Other reaction(s): Unknown   Egg-derived Products Diarrhea   Other    Other reaction(s): Other (See Comments) Eggs   Prednisone     Other reaction(s): Other (See Comments) joint pain   Risedronate    Other reaction(s): Other (See Comments)   Sulfa Antibiotics Itching, Swelling   Other reaction(s): Other (See Comments)   Sulfasalazine Other (See Comments)        Medication List     STOP taking these medications    amLODipine  5 MG tablet Commonly known as: NORVASC    hydrALAZINE  25 MG tablet Commonly known as: APRESOLINE        TAKE these medications    acetaminophen  500 MG tablet Commonly known as: TYLENOL  Take 1,000 mg by mouth every 6 (six) hours as needed for mild pain or moderate pain.   albuterol  108 (90 Base) MCG/ACT inhaler Commonly known as: VENTOLIN  HFA Inhale 2 puffs into the lungs every 6 (six) hours as needed for wheezing or shortness of breath.   allopurinol  100 MG tablet Commonly known as: ZYLOPRIM  Take 100 mg by mouth daily.   apixaban  2.5 MG Tabs tablet Commonly known as: ELIQUIS  Take 1 tablet (2.5  mg total) by mouth 2 (two) times daily.   azelastine  0.1 % nasal spray Commonly known as: ASTELIN  Place 2 sprays into both nostrils 2 (two) times daily. Use in each nostril as directed   CALCIUM  & VIT D3 BONE HEALTH PO Take 1 tablet by mouth daily. 600 mg/ 25 mg   epoetin  alfa-epbx 4000 UNIT/ML injection Commonly known as: RETACRIT  Inject 1 mL (4,000 Units total) into the vein every Monday, Wednesday, and Friday at 6 PM.   esomeprazole  40 MG capsule Commonly known as: NEXIUM  Take 1 capsule (40 mg total) by mouth daily before breakfast.   ferrous sulfate  325 (65 FE) MG tablet Take 325 mg by mouth daily with breakfast.   fluticasone  50 MCG/ACT nasal spray Commonly known as: FLONASE  Place 2 sprays into both nostrils daily.   fluticasone  furoate-vilanterol 200-25 MCG/ACT Aepb Commonly  known as: Breo Ellipta  Inhale 1 puff into the lungs daily.   folic acid  1 MG tablet Commonly known as: FOLVITE  Take 1 tablet (1 mg total) by mouth daily.   ipratropium-albuterol  0.5-2.5 (3) MG/3ML Soln Commonly known as: DUONEB INHALE THE CONTENTS OF ONE (1) VIAL VIA NEBULIZATION EVERY 6 HOURS AS NEEDED FORSHORTNESS OF BREATH   levothyroxine  150 MCG tablet Commonly known as: SYNTHROID  Take 150 mcg by mouth every morning.   lidocaine -prilocaine  cream Commonly known as: EMLA  as directed. Before dialysis   loperamide  2 MG capsule Commonly known as: IMODIUM  Take 1 capsule (2 mg total) by mouth as needed for diarrhea or loose stools.   metoprolol  tartrate 25 MG tablet Commonly known as: LOPRESSOR  Take 0.5 tablets (12.5 mg total) by mouth 2 (two) times daily. What changed:  medication strength how much to take   montelukast  10 MG tablet Commonly known as: SINGULAIR  Take 1 tablet (10 mg total) by mouth at bedtime.   nystatin powder Apply 1 Application topically 3 (three) times daily.   rosuvastatin  40 MG tablet Commonly known as: CRESTOR  Take 40 mg by mouth daily.   senna-docusate  8.6-50 MG tablet Commonly known as: Senokot-S Take 2 tablets by mouth at bedtime as needed for mild constipation.   sertraline  100 MG tablet Commonly known as: ZOLOFT  TAKE 1 TABLET BY MOUTH DAILY   torsemide  20 MG tablet Commonly known as: DEMADEX  Take 40 mg by mouth daily.   vitamin B-12 500 MCG tablet Commonly known as: CYANOCOBALAMIN  Take 500 mcg by mouth daily.               Durable Medical Equipment  (From admission, onward)           Start     Ordered   05/23/24 [phone]  For home use only DME 4 wheeled rolling walker with seat  Once       Question Answer Comment  Patient needs a walker to treat with the following condition ESRD (end stage renal disease) (HCC)   Patient needs a walker to treat with the following condition COPD (chronic obstructive pulmonary disease) (HCC)      05/23/24 9167              Allergies  Allergen Reactions   Ace Inhibitors     Other reaction(s): Unknown   Egg-Derived Products Diarrhea   Other     Other reaction(s): Other (See Comments) Eggs   Prednisone      Other reaction(s): Other (See Comments) joint pain   Risedronate     Other reaction(s): Other (See Comments)   Sulfa Antibiotics Itching and Swelling    Other reaction(s): Other (See Comments)   Sulfasalazine Other (See Comments)     Subjective: pt feeing well today after dialysis, no other concerns, denies CP/SOB  Discharge Exam: BP (!) 134/41 (BP Location: Right Arm)   Pulse (!) 57   Temp 98.5 F (36.9 C) (Oral)   Resp 19   Ht 5' 2 (1.575 m)   Wt 72.3 kg   SpO2 96%   BMI 29.15 kg/m  General: Pt is alert, awake, not in acute distress Cardiovascular: RRR, S1/S2 +, no rubs, no gallops Respiratory: CTA bilaterally, no wheezing, no rhonchi Abdominal: Soft, NT, ND, bowel sounds + Extremities: no edema, no cyanosis     The results of significant diagnostics from this hospitalization (including imaging, microbiology, ancillary and laboratory) are listed  below for reference.     Microbiology: Recent Results (from the past 240  hours)  Resp panel by RT-PCR (RSV, Flu A&B, Covid) Anterior Nasal Swab     Status: None   Collection Time: 05/18/24  4:45 AM   Specimen: Anterior Nasal Swab  Result Value Ref Range Status   SARS Coronavirus 2 by RT PCR NEGATIVE NEGATIVE Final    Comment: (NOTE) SARS-CoV-2 target nucleic acids are NOT DETECTED.  The SARS-CoV-2 RNA is generally detectable in upper respiratory specimens during the acute phase of infection. The lowest concentration of SARS-CoV-2 viral copies this assay can detect is 138 copies/mL. A negative result does not preclude SARS-Cov-2 infection and should not be used as the sole basis for treatment or other patient management decisions. A negative result may occur with  improper specimen collection/handling, submission of specimen other than nasopharyngeal swab, presence of viral mutation(s) within the areas targeted by this assay, and inadequate number of viral copies(<138 copies/mL). A negative result must be combined with clinical observations, patient history, and epidemiological information. The expected result is Negative.  Fact Sheet for Patients:  BloggerCourse.com  Fact Sheet for Healthcare Providers:  SeriousBroker.it  This test is no t yet approved or cleared by the [place]  FDA and  has been authorized for detection and/or diagnosis of SARS-CoV-2 by FDA under an Emergency Use Authorization (EUA). This EUA will remain  in effect (meaning this test can be used) for the duration of the COVID-19 declaration under Section 564(b)(1) of the Act, 21 U.S.C.section 360bbb-3(b)(1), unless the authorization is terminated  or revoked sooner.       Influenza A by PCR NEGATIVE NEGATIVE Final   Influenza B by PCR NEGATIVE NEGATIVE Final    Comment: (NOTE) The Xpert Xpress SARS-CoV-2/FLU/RSV plus assay is intended as an aid in  the diagnosis of influenza from Nasopharyngeal swab specimens and should not be used as a sole basis for treatment. Nasal washings and aspirates are unacceptable for Xpert Xpress SARS-CoV-2/FLU/RSV testing.  Fact Sheet for Patients: BloggerCourse.com  Fact Sheet for Healthcare Providers: SeriousBroker.it  This test is not yet approved or cleared by the [place]  FDA and has been authorized for detection and/or diagnosis of SARS-CoV-2 by FDA under an Emergency Use Authorization (EUA). This EUA will remain in effect (meaning this test can be used) for the duration of the COVID-19 declaration under Section 564(b)(1) of the Act, 21 U.S.C. section 360bbb-3(b)(1), unless the authorization is terminated or revoked.     Resp Syncytial Virus by PCR NEGATIVE NEGATIVE Final    Comment: (NOTE) Fact Sheet for Patients: BloggerCourse.com  Fact Sheet for Healthcare Providers: SeriousBroker.it  This test is not yet approved or cleared by the [place]  FDA and has been authorized for detection and/or diagnosis of SARS-CoV-2 by FDA under an Emergency Use Authorization (EUA). This EUA will remain in effect (meaning this test can be used) for the duration of the COVID-19 declaration under Section 564(b)(1) of the Act, 21 U.S.C. section 360bbb-3(b)(1), unless the authorization is terminated or revoked.  Performed at [facility], [address] [place], [place] [address]      Labs: BNP (last 3 results) Recent Labs    03/09/24 1918 04/24/24 0633 05/18/24 0500  BNP 2,527.2* 1,057.8* 2,692.7*   Basic Metabolic Panel: Recent Labs  Lab 05/18/24 0500 05/19/24 0414 05/21/24 0511 05/23/24 0834  NA 140 142 136 134*  K 4.2 3.4* 4.0 4.1  CL 103 105 99 96*  CO2 25 29 26 23   GLUCOSE 183* 87 103* 92  BUN 36* 56* 67* 78*  CREATININE  3.16* 3.94* 4.01* 4.06*  CALCIUM  9.3 8.7*  9.5 9.1  PHOS  --   --   --  6.5*   Liver Function Tests: Recent Labs  Lab 05/18/24 0500 05/19/24 0414 05/23/24 0834  AST 38 22  --   ALT 22 17  --   ALKPHOS 108 64  --   BILITOT 1.0 0.6  --   PROT 7.6 6.4*  --   ALBUMIN 4.0 3.4* 3.7   No results for input(s): LIPASE, AMYLASE in the last 168 hours. No results for input(s): AMMONIA in the last 168 hours. CBC: Recent Labs  Lab 05/18/24 0500 05/19/24 0414 05/21/24 0511 05/23/24 0834  WBC 12.0* 8.1 7.0 6.3  HGB 10.5* 8.6* 9.9* 10.2*  HCT 35.0* 27.9* 30.7* 31.5*  MCV 96.2 93.0 90.0 89.2  PLT 202 172 175 190   Cardiac Enzymes: No results for input(s): CKTOTAL, CKMB, CKMBINDEX, TROPONINI in the last 168 hours. BNP: Invalid input(s): POCBNP CBG: Recent Labs  Lab 05/21/24 2037 05/22/24 0916 05/22/24 1306 05/22/24 1642 05/22/24 1943  GLUCAP 112* 101* 134* 94 122*   D-Dimer No results for input(s): DDIMER in the last 72 hours. Hgb A1c No results for input(s): HGBA1C in the last 72 hours. Lipid Profile No results for input(s): CHOL, HDL, LDLCALC, TRIG, CHOLHDL, LDLDIRECT in the last 72 hours. Thyroid  function studies No results for input(s): TSH, T4TOTAL, T3FREE, THYROIDAB in the last 72 hours.  Invalid input(s): FREET3 Anemia work up No results for input(s): VITAMINB12, FOLATE, FERRITIN, TIBC, IRON, RETICCTPCT in the last 72 hours. Urinalysis    Component Value Date/Time   COLORURINE YELLOW (A) 09/24/2023 1050   APPEARANCEUR CLOUDY (A) 09/24/2023 1050   APPEARANCEUR Clear 09/17/2018 1028   LABSPEC 1.012 09/24/2023 1050   LABSPEC 1.013 06/20/2012 1508   PHURINE 5.0 09/24/2023 1050   GLUCOSEU NEGATIVE 09/24/2023 1050   GLUCOSEU Negative 06/20/2012 1508   HGBUR SMALL (A) 09/24/2023 1050   BILIRUBINUR NEGATIVE 09/24/2023 1050   BILIRUBINUR Negative 09/17/2018 1028   BILIRUBINUR Negative 06/20/2012 1508   KETONESUR NEGATIVE 09/24/2023 1050   PROTEINUR 100  (A) 09/24/2023 1050   NITRITE NEGATIVE 09/24/2023 1050   LEUKOCYTESUR LARGE (A) 09/24/2023 1050   LEUKOCYTESUR Negative 06/20/2012 1508   Sepsis Labs Recent Labs  Lab 05/18/24 0500 05/19/24 0414 05/21/24 0511 05/23/24 0834  WBC 12.0* 8.1 7.0 6.3   Microbiology Recent Results (from the past 240 hours)  Resp panel by RT-PCR (RSV, Flu A&B, Covid) Anterior Nasal Swab     Status: None   Collection Time: 05/18/24  4:45 AM   Specimen: Anterior Nasal Swab  Result Value Ref Range Status   SARS Coronavirus 2 by RT PCR NEGATIVE NEGATIVE Final    Comment: (NOTE) SARS-CoV-2 target nucleic acids are NOT DETECTED.  The SARS-CoV-2 RNA is generally detectable in upper respiratory specimens during the acute phase of infection. The lowest concentration of SARS-CoV-2 viral copies this assay can detect is 138 copies/mL. A negative result does not preclude SARS-Cov-2 infection and should not be used as the sole basis for treatment or other patient management decisions. A negative result may occur with  improper specimen collection/handling, submission of specimen other than nasopharyngeal swab, presence of viral mutation(s) within the areas targeted by this assay, and inadequate number of viral copies(<138 copies/mL). A negative result must be combined with clinical observations, patient history, and epidemiological information. The expected result is Negative.  Fact Sheet for Patients:  BloggerCourse.com  Fact Sheet for Healthcare Providers:  SeriousBroker.it  This test is no t yet approved or cleared by the [place]  FDA and  has been authorized for detection and/or diagnosis of SARS-CoV-2 by FDA under an Emergency Use Authorization (EUA). This EUA will remain  in effect (meaning this test can be used) for the duration of the COVID-19 declaration under Section 564(b)(1) of the Act, 21 U.S.C.section 360bbb-3(b)(1), unless the  authorization is terminated  or revoked sooner.       Influenza A by PCR NEGATIVE NEGATIVE Final   Influenza B by PCR NEGATIVE NEGATIVE Final    Comment: (NOTE) The Xpert Xpress SARS-CoV-2/FLU/RSV plus assay is intended as an aid in the diagnosis of influenza from Nasopharyngeal swab specimens and should not be used as a sole basis for treatment. Nasal washings and aspirates are unacceptable for Xpert Xpress SARS-CoV-2/FLU/RSV testing.  Fact Sheet for Patients: BloggerCourse.com  Fact Sheet for Healthcare Providers: SeriousBroker.it  This test is not yet approved or cleared by the [place]  FDA and has been authorized for detection and/or diagnosis of SARS-CoV-2 by FDA under an Emergency Use Authorization (EUA). This EUA will remain in effect (meaning this test can be used) for the duration of the COVID-19 declaration under Section 564(b)(1) of the Act, 21 U.S.C. section 360bbb-3(b)(1), unless the authorization is terminated or revoked.     Resp Syncytial Virus by PCR NEGATIVE NEGATIVE Final    Comment: (NOTE) Fact Sheet for Patients: BloggerCourse.com  Fact Sheet for Healthcare Providers: SeriousBroker.it  This test is not yet approved or cleared by the [place]  FDA and has been authorized for detection and/or diagnosis of SARS-CoV-2 by FDA under an Emergency Use Authorization (EUA). This EUA will remain in effect (meaning this test can be used) for the duration of the COVID-19 declaration under Section 564(b)(1) of the Act, 21 U.S.C. section 360bbb-3(b)(1), unless the authorization is terminated or revoked.  Performed at [facility], [address] [place], [place] [address]    Imaging ECHOCARDIOGRAM COMPLETE Result Date: 05/18/2024    ECHOCARDIOGRAM REPORT   Patient Name:   [name] [facility] Date of Exam: 05/18/2024 Medical Rec #:  [id number]      Height:       62.0 in Accession #:    [id number]    Weight:       166.3 lb Date of Birth:  14-May-1941     BSA:          1.767 m Patient Age:    83 years      BP:           160/50 mmHg Patient Gender: F             HR:           76 bpm. Exam Location:  [facility] Procedure: 2D Echo, Cardiac Doppler and Color Doppler (Both Spectral and Color            Flow Doppler were utilized during procedure). Indications:     CHF-Acute Diastolic I50.31  History:         Patient has prior history of Echocardiogram examinations. CHF.  Sonographer:     Bari Roar Referring Phys:  [id number] NENA PAUDEL Diagnosing Phys: Redell Cave MD IMPRESSIONS  1. Left ventricular ejection fraction, by estimation, is 55 to 60%. The left ventricle has normal function. The left ventricle has no regional wall motion abnormalities. There is mild left ventricular hypertrophy. Left ventricular diastolic parameters are consistent with Grade II diastolic dysfunction (pseudonormalization).  2. Right ventricular systolic function  is normal. The right ventricular size is normal. There is moderately elevated pulmonary artery systolic pressure.  3. Left atrial size was severely dilated.  4. The mitral valve is normal in structure. Moderate mitral valve regurgitation.  5. The aortic valve is tricuspid. Aortic valve regurgitation is not visualized.  6. The inferior vena cava is dilated in size with >50% respiratory variability, suggesting right atrial pressure of 8 mmHg. FINDINGS  Left Ventricle: Left ventricular ejection fraction, by estimation, is 55 to 60%. The left ventricle has normal function. The left ventricle has no regional wall motion abnormalities. The left ventricular internal cavity size was normal in size. There is  mild left ventricular hypertrophy. Left ventricular diastolic parameters are consistent with Grade II diastolic dysfunction (pseudonormalization). Right Ventricle: The right ventricular size is normal. No increase in right ventricular  wall thickness. Right ventricular systolic function is normal. There is moderately elevated pulmonary artery systolic pressure. The tricuspid regurgitant velocity is 3.51 m/s, and with an assumed right atrial pressure of 8 mmHg, the estimated right ventricular systolic pressure is 57.3 mmHg. Left Atrium: Left atrial size was severely dilated. Right Atrium: Right atrial size was normal in size. Pericardium: There is no evidence of pericardial effusion. Mitral Valve: The mitral valve is normal in structure. Moderate mitral valve regurgitation. MV peak gradient, 9.7 mmHg. The mean mitral valve gradient is 4.0 mmHg. Tricuspid Valve: The tricuspid valve is normal in structure. Tricuspid valve regurgitation is mild. Aortic Valve: The aortic valve is tricuspid. Aortic valve regurgitation is not visualized. Aortic valve mean gradient measures 8.0 mmHg. Aortic valve peak gradient measures 14.1 mmHg. Aortic valve area, by VTI measures 1.84 cm. Pulmonic Valve: The pulmonic valve was normal in structure. Pulmonic valve regurgitation is not visualized. Aorta: The aortic root and ascending aorta are structurally normal, with no evidence of dilitation. Venous: The inferior vena cava is dilated in size with greater than 50% respiratory variability, suggesting right atrial pressure of 8 mmHg. IAS/Shunts: No atrial level shunt detected by color flow Doppler.  LEFT VENTRICLE PLAX 2D LVIDd:         5.90 cm      Diastology LVIDs:         4.40 cm      LV e' medial:    4.90 cm/s LV PW:         1.20 cm      LV E/e' medial:  32.0 LV IVS:        1.30 cm      LV e' lateral:   7.83 cm/s LVOT diam:     1.80 cm      LV E/e' lateral: 20.1 LV SV:         79 LV SV Index:   45 LVOT Area:     2.54 cm  LV Volumes (MOD) LV vol d, MOD A2C: 115.0 ml LV vol d, MOD A4C: 135.0 ml LV vol s, MOD A2C: 58.8 ml LV vol s, MOD A4C: 72.7 ml LV SV MOD A2C:     56.2 ml LV SV MOD A4C:     135.0 ml LV SV MOD BP:      63.9 ml RIGHT VENTRICLE RV Basal diam:  3.60 cm RV  Mid diam:    3.40 cm RV S prime:     13.60 cm/s TAPSE (M-mode): 2.1 cm LEFT ATRIUM              Index        RIGHT ATRIUM  Index LA diam:        5.50 cm  3.11 cm/m   RA Area:     20.30 cm LA Vol (A2C):   131.0 ml 74.12 ml/m  RA Volume:   59.60 ml  33.72 ml/m LA Vol (A4C):   148.0 ml 83.73 ml/m LA Biplane Vol: 144.0 ml 81.47 ml/m  AORTIC VALVE                     PULMONIC VALVE AV Area (Vmax):    1.83 cm      PV Vmax:          1.20 m/s AV Area (Vmean):   1.84 cm      PV Peak grad:     5.8 mmHg AV Area (VTI):     1.84 cm      PR End Diast Vel: 11.97 msec AV Vmax:           188.00 cm/s   RVOT Peak grad:   4 mmHg AV Vmean:          132.000 cm/s AV VTI:            0.429 m AV Peak Grad:      14.1 mmHg AV Mean Grad:      8.0 mmHg LVOT Vmax:         135.00 cm/s LVOT Vmean:        95.600 cm/s LVOT VTI:          0.310 m LVOT/AV VTI ratio: 0.72  AORTA Ao Root diam: 2.50 cm Ao Asc diam:  3.10 cm MITRAL VALVE                TRICUSPID VALVE MV Area (PHT): 7.02 cm     TR Peak grad:   49.3 mmHg MV Area VTI:   2.15 cm     TR Vmax:        351.00 cm/s MV Peak grad:  9.7 mmHg MV Mean grad:  4.0 mmHg     SHUNTS MV Vmax:       1.56 m/s     Systemic VTI:  0.31 m MV Vmean:      99.1 cm/s    Systemic Diam: 1.80 cm MV Decel Time: 108 msec MV E velocity: 157.00 cm/s MV A velocity: 117.00 cm/s MV E/A ratio:  1.34 MV A Prime:    10.0 cm/s Redell Cave MD Electronically signed by Redell Cave MD Signature Date/Time: 05/18/2024/4:06:10 PM    Final    DG Chest Portable 1 View Result Date: 05/18/2024 EXAM: 1 VIEW XRAY OF THE CHEST 05/18/2024 05:00:00 AM COMPARISON: None available. CLINICAL HISTORY: Shortness of breath. To ER from home via EMS after waking up in respiratory distress. Hypoxia FINDINGS: LUNGS AND PLEURA: Increased and diffuse interstitial edema is present. Small effusions are suspected. No significant airspace consolidation is present. HEART AND MEDIASTINUM: Cardiomegaly is again noted. Atherosclerotic  changes are present at the aortic arch. BONES AND SOFT TISSUES: A left IJ dialysis catheter is stable. IMPRESSION: 1. Increased and diffuse interstitial edema with suspected small effusions. No significant airspace consolidation. 2. Cardiomegaly and atherosclerotic changes at the aortic arch. Electronically signed by: Lonni Necessary MD 05/18/2024 05:26 AM EDT RP Workstation: [id number]      Time coordinating discharge: over 30 minutes  SIGNED:  Jenevieve Kirschbaum DO [facility]

## 2024-05-23 NOTE — TOC Progression Note (Signed)
 Transition of Care [facility]) - Progression Note    Patient Details  Name: [name] MRN: [id number] Date of Birth: 02-02-1941  Transition of [facility]) CM/SW Contact  Lauraine JAYSON Carpen, LCSW Phone Number: 05/23/2024, 8:44 AM  Clinical Narrative:  Ordered rollator through [employer].   Expected Discharge Plan: Home w Home Health Services Barriers to Discharge: Continued Medical Work up               Expected Discharge Plan and Services     Post Acute Care Choice: Resumption of Svcs/PTA Provider Living arrangements for the past 2 months: Single Family Home                           HH Arranged: RN, PT, OT [facility] Agency: [facility] Date [facility] Agency Contacted: 05/20/24   Representative spoke with at [facility] Agency: [place]   Social Drivers of Health (SDOH) Interventions SDOH Screenings   Food Insecurity: No Food Insecurity (05/21/2024)  Housing: Unknown (05/21/2024)  Recent Concern: Housing - High Risk (04/25/2024)  Transportation Needs: No Transportation Needs (05/21/2024)  Recent Concern: Transportation Needs - Unmet Transportation Needs (04/25/2024)  Utilities: Not At Risk (05/21/2024)  Depression (PHQ2-9): Low Risk  (05/08/2024)  Recent Concern: Depression (PHQ2-9) - Medium Risk (02/15/2024)  Financial Resource Strain: Medium Risk (04/14/2024)   Received from [facility]  Physical Activity: Insufficiently Active (06/12/2023)  Social Connections: Unknown (05/21/2024)  Recent Concern: Social Connections - Moderately Isolated (04/25/2024)  Stress: No Stress Concern Present (06/12/2023)  Tobacco Use: Low Risk  (05/18/2024)    Readmission Risk Interventions    05/20/2024    3:15 PM 07/30/2023   10:37 AM 04/19/2023    1:10 PM  Readmission Risk Prevention Plan  Transportation Screening Complete Complete Complete  Medication Review Oceanographer) Complete Complete Complete  PCP or Specialist appointment within 3-5 days of discharge Complete Complete    HRI or Home Care Consult Complete Complete Complete  SW Recovery Care/Counseling Consult Complete Complete Complete  Palliative Care Screening Not Applicable Not Applicable Not Applicable  Skilled Nursing Facility Not Applicable Not Applicable Not Applicable

## 2024-05-23 NOTE — TOC Transition Note (Signed)
 Transition of Care [facility]) - Discharge Note   Patient Details  Name: LAPORSCHA LINEHAN MRN: [id number] Date of Birth: 1941/09/03  Transition of Care [facility]) CM/SW Contact:  Racheal LITTIE Schimke, RN Phone Number: 05/23/2024, 4:10 PM   Final next level of care: Home w Home Health Services Barriers to Discharge: Barriers Resolved   Patient Goals and CMS Choice    Discharge Placement                Patient and family notified of of transfer: 05/23/24  Discharge Plan and Services Additional resources added to the After Visit Summary for       Post Acute Care Choice: Resumption of Svcs/PTA Provider          DME Arranged: Vannie rolling with seat DME Agency: [employer] Date DME Agency Contacted: 05/22/24     HH Arranged: PT, OT, RN HH Agency: [facility] Date [facility] Agency Contacted: 05/22/24   Representative spoke with at [facility] Agency: [place]  Social Drivers of Health (SDOH) Interventions SDOH Screenings   Food Insecurity: No Food Insecurity (05/21/2024)  Housing: Unknown (05/21/2024)  Recent Concern: Housing - High Risk (04/25/2024)  Transportation Needs: No Transportation Needs (05/21/2024)  Recent Concern: Transportation Needs - Unmet Transportation Needs (04/25/2024)  Utilities: Not At Risk (05/21/2024)  Depression (PHQ2-9): Low Risk  (05/08/2024)  Recent Concern: Depression (PHQ2-9) - Medium Risk (02/15/2024)  Financial Resource Strain: Medium Risk (04/14/2024)   Received from [facility]  Physical Activity: Insufficiently Active (06/12/2023)  Social Connections: Unknown (05/21/2024)  Recent Concern: Social Connections - Moderately Isolated (04/25/2024)  Stress: No Stress Concern Present (06/12/2023)  Tobacco Use: Low Risk  (05/18/2024)     Readmission Risk Interventions    05/20/2024    3:15 PM 07/30/2023   10:37 AM 04/19/2023    1:10 PM  Readmission Risk Prevention Plan  Transportation Screening Complete Complete Complete  Medication Review Special educational needs teacher) Complete Complete Complete  PCP or Specialist appointment within 3-5 days of discharge Complete Complete   HRI or Home Care Consult Complete Complete Complete  SW Recovery Care/Counseling Consult Complete Complete Complete  Palliative Care Screening Not Applicable Not Applicable Not Applicable  Skilled Nursing Facility Not Applicable Not Applicable Not Applicable

## 2024-05-23 NOTE — Progress Notes (Signed)
 Hemodialysis Note:  Received patient in bed to unit. Alert and oriented. Informed consent singed and in chart.  Treatment initiated: 0830 Treatment completed: 1208  Access used: Left internal jugular catheter Access issues: None  100 ml saline bolus given due to leg cramps. Transported back to room, alert without acute distress. Report given to patient's RN.  Total UF removed: 900 ml Medications given: Retacrit  4000 units IV  Post HD weight: 71.4 kg  [name]

## 2024-05-23 NOTE — Progress Notes (Signed)
 Referring Provider: No ref. provider found Primary Care Physician:  Ostwalt, Janna, PA-C Primary Nephrologist:  Dr.   [place] for Consultation: ESRD  HPI: 83 year old female with history of hypertension, coronary artery disease, congestive heart failure, COPD, obesity, obstructive sleep apnea on CPAP, end-stage renal disease on hemodialysis on Monday Wednesday Friday schedule now comes in with history of worsening shortness of breath with chest x-ray showing congestion.  She was placed on BiPAP.  Update: Patient seen and evaluated during dialysis   HEMODIALYSIS FLOWSHEET:  Blood Flow Rate (mL/min): 399 mL/min Arterial Pressure (mmHg): -197.97 mmHg Venous Pressure (mmHg): 211.5 mmHg TMP (mmHg): -2.22 mmHg Ultrafiltration Rate (mL/min): 0 mL/min Dialysate Flow Rate (mL/min): 300 ml/min Dialysis Fluid Bolus: Normal Saline Bolus Amount (mL): 100 mL  Complains of shortness of breath. States oxygen was not connected overnight.    Past Medical History:  Diagnosis Date   Actinic keratosis    Albuminuria    Allergy    Anemia    Arthritis    Asthma    Basal cell carcinoma 04/12/2017   Above right lateral brow. Nodulocystic type. EDC   Cancer (HCC)    skin   Cataract cortical, senile    CHF (congestive heart failure) (HCC)    COPD (chronic obstructive pulmonary disease) (HCC)    Diabetes mellitus without complication (HCC)    GERD (gastroesophageal reflux disease)    Hemorrhoids    History of kidney stones    Hyperlipidemia    Hypertension    Hypothyroidism    Lyme disease    No kidney function    OSA (obstructive sleep apnea)    Osteoporosis    Osteoporosis    Oxygen deficiency July 2025   Reflux esophagitis    Sleep apnea    Steatohepatitis    Steatohepatitis     Past Surgical History:  Procedure Laterality Date   A/V FISTULAGRAM N/A 12/31/2023   Procedure: A/V Fistulagram;  Surgeon: Marea Selinda RAMAN, MD;  Location: [facility] INVASIVE CV LAB;  Service: Cardiovascular;   Laterality: N/A;   ABDOMINAL HYSTERECTOMY     APPENDECTOMY     AV FISTULA PLACEMENT Left 05/19/2022   Procedure: ARTERIOVENOUS (AV) FISTULA CREATION ( BRACHIAL CEPHALIC );  Surgeon: Jama Cordella MATSU, MD;  Location: [facility] ORS;  Service: Vascular;  Laterality: Left;   CARDIAC CATHETERIZATION  1980   [facility]   CARDIAC CATHETERIZATION  08/13/2014   [facility]. no significant CAD, normal LVEDP.    CATARACT EXTRACTION     CHOLECYSTECTOMY     COLONOSCOPY     COLONOSCOPY WITH PROPOFOL  N/A 12/07/2016   Procedure: COLONOSCOPY WITH PROPOFOL ;  Surgeon: Gladis RAYMOND Mariner, MD;  Location: [facility] ENDOSCOPY;  Service: Endoscopy;  Laterality: N/A;   DIALYSIS/PERMA CATHETER INSERTION N/A 12/31/2023   Procedure: DIALYSIS/PERMA CATHETER INSERTION;  Surgeon: Marea Selinda RAMAN, MD;  Location: [facility] INVASIVE CV LAB;  Service: Cardiovascular;  Laterality: N/A;   ESOPHAGOGASTRODUODENOSCOPY     ESOPHAGOGASTRODUODENOSCOPY (EGD) WITH PROPOFOL  N/A 12/07/2016   Procedure: ESOPHAGOGASTRODUODENOSCOPY (EGD) WITH PROPOFOL ;  Surgeon: Gladis RAYMOND Mariner, MD;  Location: [facility] ENDOSCOPY;  Service: Endoscopy;  Laterality: N/A;   ESOPHAGOGASTRODUODENOSCOPY (EGD) WITH PROPOFOL  N/A 01/07/2018   Procedure: ESOPHAGOGASTRODUODENOSCOPY (EGD) WITH PROPOFOL ;  Surgeon: Mariner Gladis RAYMOND, MD;  Location: [facility] ENDOSCOPY;  Service: Endoscopy;  Laterality: N/A;   EYE SURGERY     HEMATOMA EVACUATION Right 08/02/2023   Procedure: EVACUATION HEMATOMA;  Surgeon: Jordis Laneta FALCON, MD;  Location: [facility] ORS;  Service: General;  Laterality: Right;   HEMORRHOID SURGERY  PARTIAL HYSTERECTOMY     THYROIDECTOMY N/A 08/25/2019   Procedure: THYROIDECTOMY EXTRACTION OF SUBTOTAL COMPONENT; PARATHYROID  AUTOTRANSPLANT X1;  Surgeon: Marolyn Nest, MD;  Location: [facility] ORS;  Service: General;  Laterality: N/A;  With Nerve Monitoring(RLN)   TONSILLECTOMY      Prior to Admission medications   Medication Sig Start Date End Date Taking? Authorizing Provider  acetaminophen  (TYLENOL )  500 MG tablet Take 1,000 mg by mouth every 6 (six) hours as needed for mild pain or moderate pain.    [provider]  albuterol  (VENTOLIN  HFA) 108 (90 Base) MCG/ACT inhaler Inhale 2 puffs into the lungs every 6 (six) hours as needed for wheezing or shortness of breath. 11/14/23   Kasa, Kurian, MD  allopurinol  (ZYLOPRIM ) 100 MG tablet Take 100 mg by mouth daily. 09/17/23   [provider]  amLODipine  (NORVASC ) 5 MG tablet Take 1 tablet (5 mg total) by mouth daily. 08/06/23 08/05/24  Alexander, Natalie, DO  apixaban  (ELIQUIS ) 2.5 MG TABS tablet Take 1 tablet (2.5 mg total) by mouth 2 (two) times daily. 06/04/23 06/03/24  Von Bellis, MD  azelastine  (ASTELIN ) 0.1 % nasal spray Place 2 sprays into both nostrils 2 (two) times daily. Use in each nostril as directed 10/02/23   Tobie Comp B, MD  epoetin  alfa-epbx (RETACRIT ) 4000 UNIT/ML injection Inject 1 mL (4,000 Units total) into the vein every Monday, Wednesday, and Friday at 6 PM. 01/02/24   Awanda City, MD  esomeprazole  (NEXIUM ) 40 MG capsule Take 1 capsule (40 mg total) by mouth daily before breakfast. 11/27/23   Agrawal, Kavita, MD  ferrous sulfate  325 (65 FE) MG tablet Take 325 mg by mouth daily with breakfast.    [provider]  fluticasone  (FLONASE ) 50 MCG/ACT nasal spray Place 2 sprays into both nostrils daily. 10/02/23   Tobie Comp NOVAK, MD  fluticasone  furoate-vilanterol (BREO ELLIPTA ) 200-25 MCG/ACT AEPB Inhale 1 puff into the lungs daily. 11/14/23   Kasa, Kurian, MD  folic acid  (FOLVITE ) 1 MG tablet Take 1 tablet (1 mg total) by mouth daily. 01/01/24   Awanda City, MD  hydrALAZINE  (APRESOLINE ) 25 MG tablet Take 1 tablet (25 mg total) by mouth 3 (three) times daily. 05/01/24   Jens Durand, MD  ipratropium-albuterol  (DUONEB) 0.5-2.5 (3) MG/3ML SOLN INHALE THE CONTENTS OF ONE (1) VIAL VIA NEBULIZATION EVERY 6 HOURS AS NEEDED FORSHORTNESS OF BREATH 04/23/24   Kasa, Kurian, MD  levothyroxine  (SYNTHROID ) 150 MCG tablet Take 150 mcg  by mouth every morning. 02/25/24   [provider]  lidocaine -prilocaine  (EMLA ) cream as directed. Before dialysis 01/02/24   [provider]  loperamide  (IMODIUM ) 2 MG capsule Take 1 capsule (2 mg total) by mouth as needed for diarrhea or loose stools. 08/05/22   Amin, Sumayya, MD  metoprolol  tartrate (LOPRESSOR ) 50 MG tablet Take 0.5 tablets (25 mg total) by mouth 2 (two) times daily. 05/01/24   Jens Durand, MD  montelukast  (SINGULAIR ) 10 MG tablet Take 1 tablet (10 mg total) by mouth at bedtime. 01/01/24   Awanda City, MD  Multiple Minerals-Vitamins (CALCIUM  & VIT D3 BONE HEALTH PO) Take 1 tablet by mouth daily. 600 mg/ 25 mg    [provider]  nystatin powder Apply 1 Application topically 3 (three) times daily.    [provider]  rosuvastatin  (CRESTOR ) 40 MG tablet Take 40 mg by mouth daily.    [provider]  senna-docusate (SENOKOT-S) 8.6-50 MG tablet Take 2 tablets by mouth at bedtime as needed for mild constipation. 08/06/23  Alexander, Natalie, DO  sertraline  (ZOLOFT ) 100 MG tablet TAKE 1 TABLET BY MOUTH DAILY 05/13/24   Ostwalt, Janna, PA-C  torsemide  (DEMADEX ) 20 MG tablet Take 40 mg by mouth daily.    [provider]  vitamin B-12 (CYANOCOBALAMIN ) 500 MCG tablet Take 500 mcg by mouth daily.    [provider]    Current Facility-Administered Medications  Medication Dose Route Frequency Provider Last Rate Last Admin   acetaminophen  (TYLENOL ) tablet 650 mg  650 mg Oral Q6H PRN Paudel, Keshab, MD   650 mg at 05/23/24 0011   Or   acetaminophen  (TYLENOL ) suppository 650 mg  650 mg Rectal Q6H PRN Roann Gouty, MD       allopurinol  (ZYLOPRIM ) tablet 100 mg  100 mg Oral Daily Paudel, Keshab, MD   100 mg at 05/22/24 0903   alteplase  (CATHFLO ACTIVASE ) injection 2 mg  2 mg Intracatheter Once PRN Druscilla Bald, NP       apixaban  (ELIQUIS ) tablet 2.5 mg  2.5 mg Oral BID Paudel, Keshab, MD   2.5 mg at 05/22/24 2134   Chlorhexidine   Gluconate Cloth 2 % PADS 6 each  6 each Topical Q0600 Dominica Brandy, MD   6 each at 05/23/24 0537   Chlorhexidine  Gluconate Cloth 2 % PADS 6 each  6 each Topical Q0600 Korrapati, Madhu, MD   6 each at 05/22/24 0659   cyanocobalamin  (VITAMIN B12) tablet 500 mcg  500 mcg Oral Daily Paudel, Keshab, MD   500 mcg at 05/22/24 9096   epoetin  alfa-epbx (RETACRIT ) injection 4,000 Units  4,000 Units Intravenous Q M,W,F-1800 Florina Glas, NP   4,000 Units at 05/23/24 0932   ferrous sulfate  tablet 325 mg  325 mg Oral Q breakfast Paudel, Keshab, MD   325 mg at 05/22/24 9095   guaiFENesin -dextromethorphan  (ROBITUSSIN DM) 100-10 MG/5ML syrup 10 mL  10 mL Oral Q4H PRN Duncan, Hazel V, MD       heparin  injection 1,000 Units  1,000 Units Intracatheter PRN Druscilla Bald, NP       insulin  aspart (novoLOG ) injection 0-5 Units  0-5 Units Subcutaneous QHS Paudel, Gouty, MD       insulin  aspart (novoLOG ) injection 0-9 Units  0-9 Units Subcutaneous TID WC Paudel, Keshab, MD   2 Units at 05/18/24 1220   ipratropium-albuterol  (DUONEB) 0.5-2.5 (3) MG/3ML nebulizer solution 3 mL  3 mL Nebulization BID Alexander, Natalie, DO   3 mL at 05/23/24 9048   levothyroxine  (SYNTHROID ) tablet 150 mcg  150 mcg Oral q morning Paudel, Keshab, MD   150 mcg at 05/23/24 9462   metoprolol  tartrate (LOPRESSOR ) tablet 12.5 mg  12.5 mg Oral BID Alexander, Natalie, DO   12.5 mg at 05/22/24 2134   montelukast  (SINGULAIR ) tablet 10 mg  10 mg Oral QHS Paudel, Keshab, MD   10 mg at 05/22/24 2134   nitroGLYCERIN  (NITROSTAT ) SL tablet 0.4 mg  0.4 mg Sublingual Q5 min PRN Josette Ade, MD   0.4 mg at 05/19/24 1211   ondansetron  (ZOFRAN ) tablet 4 mg  4 mg Oral Q6H PRN Paudel, Keshab, MD       Or   ondansetron  (ZOFRAN ) injection 4 mg  4 mg Intravenous Q6H PRN Paudel, Keshab, MD       polyethylene glycol (MIRALAX  / GLYCOLAX ) packet 17 g  17 g Oral Daily PRN Paudel, Gouty, MD       rosuvastatin  (CRESTOR ) tablet 40 mg  40 mg Oral Daily  Paudel, Keshab, MD   40 mg at 05/22/24 [phone]  sertraline  (ZOLOFT ) tablet 100 mg  100 mg Oral Daily Paudel, Keshab, MD   100 mg at 05/22/24 9096   torsemide  (DEMADEX ) tablet 40 mg  40 mg Oral Daily Alexander, Natalie, DO   40 mg at 05/22/24 9096    Allergies as of 05/18/2024 - Review Complete 05/18/2024  Allergen Reaction Noted   Ace inhibitors  07/24/2014   Egg-derived products Diarrhea 12/06/2016   Other  07/24/2014   Prednisone   07/24/2014   Risedronate  07/24/2014   Sulfa antibiotics Itching and Swelling 07/24/2014   Sulfasalazine Other (See Comments) 12/06/2016    Family History  Problem Relation Age of Onset   Cancer Mother        breast   Kidney disease Mother    Breast cancer Mother    Diabetes Mother    Heart disease Mother    Hypertension Mother    Hypertension Father    Heart attack Father    Cancer Father    COPD Father    Arthritis Maternal Grandfather    Kidney disease Maternal Uncle     Social History   Socioeconomic History   Marital status: Married    Spouse name: Not on file   Number of children: Not on file   Years of education: Not on file   Highest education level: Associate degree: academic program  Occupational History   Not on file  Tobacco Use   Smoking status: Never   Smokeless tobacco: Never  Vaping Use   Vaping status: Never Used  Substance and Sexual Activity   Alcohol use: No   Drug use: No   Sexual activity: Not Currently    Birth control/protection: None  Other Topics Concern   Not on file  Social History Narrative   Lives at home with husband .   Social Drivers of Health   Financial Resource Strain: Medium Risk (04/14/2024)   Received from [facility]   Overall Financial Resource Strain (CARDIA)    Difficulty of Paying Living Expenses: Somewhat hard  Food Insecurity: No Food Insecurity (05/21/2024)   Hunger Vital Sign    Worried About Running Out of Food in the Last Year: Never true    Ran Out of Food  in the Last Year: Never true  Transportation Needs: No Transportation Needs (05/21/2024)   PRAPARE - Administrator, Civil Service (Medical): No    Lack of Transportation (Non-Medical): No  Recent Concern: Transportation Needs - Unmet Transportation Needs (04/25/2024)   PRAPARE - Transportation    Lack of Transportation (Medical): Yes    Lack of Transportation (Non-Medical): Yes  Physical Activity: Insufficiently Active (06/12/2023)   Exercise Vital Sign    Days of Exercise per Week: 7 days    Minutes of Exercise per Session: 20 min  Stress: No Stress Concern Present (06/12/2023)   [employer] of Occupational Health - Occupational Stress Questionnaire    Feeling of Stress : Only a little  Social Connections: Unknown (05/21/2024)   Social Connection and Isolation Panel    Frequency of Communication with Friends and Family: More than three times a week    Frequency of Social Gatherings with Friends and Family: More than three times a week    Attends Religious Services: More than 4 times per year    Active Member of [employer] or Organizations: Yes    Attends Banker Meetings: Not on file    Marital Status: Not on file  Recent Concern: Social Connections - Moderately  Isolated (04/25/2024)   Social Connection and Isolation Panel    Frequency of Communication with Friends and Family: More than three times a week    Frequency of Social Gatherings with Friends and Family: More than three times a week    Attends Religious Services: Never    Database administrator or Organizations: No    Attends Banker Meetings: Never    Marital Status: Married  Catering manager Violence: Not At Risk (05/21/2024)   Humiliation, Afraid, Rape, and Kick questionnaire    Fear of Current or Ex-Partner: No    Emotionally Abused: No    Physically Abused: No    Sexually Abused: No    Physical Exam: Vital signs in last 24 hours: Temp:  [98.3 F (36.8 C)-98.6 F (37 C)] 98.3  F (36.8 C) (08/22 0820) Pulse Rate:  [50-60] 50 (08/22 1000) Resp:  [12-21] 18 (08/22 1000) BP: (103-154)/(36-51) 124/41 (08/22 1000) SpO2:  [95 %-100 %] 100 % (08/22 1000) Weight:  [72.3 kg-75 kg] 72.3 kg (08/22 0820) Last BM Date : 05/22/24 General:   Alert,  Well-developed, well-nourished, pleasant. Head:  Normocephalic and atraumatic. Eyes:  Sclera clear, no icterus.   Conjunctiva pink. Ears:  Normal auditory acuity. Nose:  No deformity, discharge,  or lesions. Lungs:  wheeze throughout, 3L [place]. Tachypnea  Heart:  Regular rate and rhythm; no murmurs, clicks, rubs,  or gallops. Abdomen:  Soft, nontender and nondistended. No masses, hepatosplenomegaly or hernias noted. Normal bowel sounds, without guarding, and without rebound.   Extremities:  Without clubbing or edema.  ACCESS: Lt permcath, Lt AVF  Intake/Output from previous day: 08/21 0701 - 08/22 0700 In: 640 [P.O.:640] Out: -  Intake/Output this shift: No intake/output data recorded.  Lab Results: Recent Labs    05/21/24 0511 05/23/24 0834  WBC 7.0 6.3  HGB 9.9* 10.2*  HCT 30.7* 31.5*  PLT 175 190   BMET Recent Labs    05/21/24 0511 05/23/24 0834  NA 136 134*  K 4.0 4.1  CL 99 96*  CO2 26 23  GLUCOSE 103* 92  BUN 67* 78*  CREATININE 4.01* 4.06*  CALCIUM  9.5 9.1  PHOS  --  6.5*   LFT Recent Labs    05/23/24 0834  ALBUMIN 3.7    PT/INR No results for input(s): LABPROT, INR in the last 72 hours. Hepatitis Panel No results for input(s): HEPBSAG, HCVAB, HEPAIGM, HEPBIGM in the last 72 hours.   Studies/Results: No results found.   Assessment/Plan:  83 year old female with history of hypertension, coronary artery disease, congestive heart failure, COPD, obesity, obstructive sleep apnea on CPAP, end-stage renal disease on hemodialysis on Monday Wednesday Friday schedule now comes in with history of worsening shortness of breath with chest x-ray showing congestion.  She was placed on  BiPAP.  EDW 71.5kg  ESRD on hemodialysis: Patient has been on Monday Wednesday Friday schedule. Receiving dialysis today, UF 1-1.5L as tolerated. Next treatment scheduled for Monday.   ANEMIA with chronic kidney disease: Hemoglobin, 10.2, stable.  Will hold retacrit  with further treatments and monitor.   Secondary Hyperparathyroidism: with outpatient labs: PTH 100, phosphorus 4.8, calcium  9.7 on 05/12/24.   Lab Results  Component Value Date   CALCIUM  9.1 05/23/2024   PHOS 6.5 (H) 05/23/2024    Calcium  stable, phos remains elevated.   Hypertension with chronic kidney disease, receiving amlodipine , furosemide , hydralazine , and metoprolol . Blood pressure 135/52 during dialysis.    LOS: 5 [facility] @TODAY @10 :48 AM

## 2024-05-23 NOTE — Plan of Care (Signed)
  Problem: Education: Goal: Ability to describe self-care measures that may prevent or decrease complications (Diabetes Survival Skills Education) will improve Outcome: Adequate for Discharge Goal: Individualized Educational Video(s) Outcome: Adequate for Discharge   Problem: Coping: Goal: Ability to adjust to condition or change in health will improve Outcome: Adequate for Discharge   Problem: Fluid Volume: Goal: Ability to maintain a balanced intake and output will improve Outcome: Adequate for Discharge   Problem: Health Behavior/Discharge Planning: Goal: Ability to identify and utilize available resources and services will improve Outcome: Adequate for Discharge Goal: Ability to manage health-related needs will improve Outcome: Adequate for Discharge   Problem: Metabolic: Goal: Ability to maintain appropriate glucose levels will improve Outcome: Adequate for Discharge   Problem: Nutritional: Goal: Maintenance of adequate nutrition will improve Outcome: Adequate for Discharge Goal: Progress toward achieving an optimal weight will improve Outcome: Adequate for Discharge   Problem: Skin Integrity: Goal: Risk for impaired skin integrity will decrease Outcome: Adequate for Discharge   Problem: Tissue Perfusion: Goal: Adequacy of tissue perfusion will improve Outcome: Adequate for Discharge   Problem: Acute Rehab PT Goals(only PT should resolve) Goal: Pt Will Go Sit To Supine/Side Outcome: Adequate for Discharge Goal: Pt Will Transfer Bed To Chair/Chair To Bed Outcome: Adequate for Discharge Goal: Pt Will Ambulate Outcome: Adequate for Discharge Goal: Pt Will Go Up/Down Stairs Outcome: Adequate for Discharge   Problem: Education: Goal: Ability to demonstrate management of disease process will improve Outcome: Adequate for Discharge Goal: Ability to verbalize understanding of medication therapies will improve Outcome: Adequate for Discharge Goal: Individualized  Educational Video(s) Outcome: Adequate for Discharge   Problem: Activity: Goal: Capacity to carry out activities will improve Outcome: Adequate for Discharge   Problem: Cardiac: Goal: Ability to achieve and maintain adequate cardiopulmonary perfusion will improve Outcome: Adequate for Discharge   Problem: Education: Goal: Knowledge of General Education information will improve Description: Including pain rating scale, medication(s)/side effects and non-pharmacologic comfort measures Outcome: Adequate for Discharge   Problem: Health Behavior/Discharge Planning: Goal: Ability to manage health-related needs will improve Outcome: Adequate for Discharge   Problem: Clinical Measurements: Goal: Ability to maintain clinical measurements within normal limits will improve Outcome: Adequate for Discharge Goal: Will remain free from infection Outcome: Adequate for Discharge Goal: Diagnostic test results will improve Outcome: Adequate for Discharge Goal: Respiratory complications will improve Outcome: Adequate for Discharge Goal: Cardiovascular complication will be avoided Outcome: Adequate for Discharge   Problem: Activity: Goal: Risk for activity intolerance will decrease Outcome: Adequate for Discharge   Problem: Nutrition: Goal: Adequate nutrition will be maintained Outcome: Adequate for Discharge   Problem: Coping: Goal: Level of anxiety will decrease Outcome: Adequate for Discharge   Problem: Elimination: Goal: Will not experience complications related to bowel motility Outcome: Adequate for Discharge Goal: Will not experience complications related to urinary retention Outcome: Adequate for Discharge   Problem: Pain Managment: Goal: General experience of comfort will improve and/or be controlled Outcome: Adequate for Discharge   Problem: Safety: Goal: Ability to remain free from injury will improve Outcome: Adequate for Discharge   Problem: Skin Integrity: Goal:  Risk for impaired skin integrity will decrease Outcome: Adequate for Discharge

## 2024-05-23 NOTE — Progress Notes (Signed)
 Pt discharged to home. AVS Discharge instructions provided to pt and pt's family, at bedside. All questions and concerns answered at this time. Teach-back technique utilized. All PIVs removed, sites WDL. All pt belongings taken with pt--pt verified. Telemetry disconnected and notified.

## 2024-05-23 NOTE — Progress Notes (Signed)
 D/C order noted. Contacted [facility] [address] MWF to be advised of pt and d/c today. The pt should resume care on 05/26/2024. Requested documents faxed to clinic for continuation of care.  Suzen Satchel Dialysis Navigator [phone]

## 2024-05-24 NOTE — Progress Notes (Signed)
 MSSP CCM Inpatient and ED at [facility] 05/24/24 (Saturday Rec'd adt via email Recent hospital discharge Location: [facility]. Discharge date: 05/23/2024. [facility] - Patient Enrolled  This serves as confirmation that your patient, KRISTALYNN CODDINGTON, has enrolled in [place].  Ronal FORBES [place] was identified by admission/discharge/transfer alert as a candidate for [facility] Complex Care Management service(s).  An appointment has been scheduled for [employer] on June 03, 2024 with a [facility] Manager, Ronal Favre. KIM SLATER    [address]; [place], [place] [address] l  [email or web address] l [phone]   For more information on [facility] services, click here.

## 2024-05-26 ENCOUNTER — Telehealth: Payer: Self-pay

## 2024-05-26 NOTE — Transitions of Care (Post Inpatient/ED Visit) (Signed)
   05/26/2024  Name: STEPHENIE NAVEJAS MRN: [id number] DOB: 1941/03/24  Today's TOC FU Call Status: Today's TOC FU Call Status:: Unsuccessful Call (1st Attempt) Unsuccessful Call (1st Attempt) Date: 05/26/24  Attempted to reach the patient regarding the most recent Inpatient/ED visit. Spoke with patient via phone listed for DPR, patient states she is in dialysis and request return call tomoorw.  Follow Up Plan: Additional outreach attempts will be made to reach the patient to complete the Transitions of Care (Post Inpatient/ED visit) call.   Richerd Fish, RN, BSN, CCM [facility], [facility] RN Care Manager Direct Dial: [phone]

## 2024-05-27 ENCOUNTER — Ambulatory Visit (INDEPENDENT_AMBULATORY_CARE_PROVIDER_SITE_OTHER)

## 2024-05-27 ENCOUNTER — Telehealth: Payer: Self-pay

## 2024-05-27 ENCOUNTER — Other Ambulatory Visit: Payer: Self-pay | Admitting: Physician Assistant

## 2024-05-27 ENCOUNTER — Encounter (INDEPENDENT_AMBULATORY_CARE_PROVIDER_SITE_OTHER): Payer: Medicare Other

## 2024-05-27 DIAGNOSIS — I132 Hypertensive heart and chronic kidney disease with heart failure and with stage 5 chronic kidney disease, or end stage renal disease: Secondary | ICD-10-CM | POA: Diagnosis not present

## 2024-05-27 DIAGNOSIS — I5033 Acute on chronic diastolic (congestive) heart failure: Secondary | ICD-10-CM | POA: Diagnosis not present

## 2024-05-27 DIAGNOSIS — F419 Anxiety disorder, unspecified: Secondary | ICD-10-CM

## 2024-05-27 DIAGNOSIS — G4733 Obstructive sleep apnea (adult) (pediatric): Secondary | ICD-10-CM

## 2024-05-27 DIAGNOSIS — N186 End stage renal disease: Secondary | ICD-10-CM | POA: Diagnosis not present

## 2024-05-27 DIAGNOSIS — F32A Depression, unspecified: Secondary | ICD-10-CM

## 2024-05-27 DIAGNOSIS — E039 Hypothyroidism, unspecified: Secondary | ICD-10-CM

## 2024-05-27 DIAGNOSIS — Z0000 Encounter for general adult medical examination without abnormal findings: Secondary | ICD-10-CM

## 2024-05-27 DIAGNOSIS — Z7951 Long term (current) use of inhaled steroids: Secondary | ICD-10-CM

## 2024-05-27 DIAGNOSIS — I959 Hypotension, unspecified: Secondary | ICD-10-CM

## 2024-05-27 DIAGNOSIS — J449 Chronic obstructive pulmonary disease, unspecified: Secondary | ICD-10-CM | POA: Diagnosis not present

## 2024-05-27 DIAGNOSIS — I081 Rheumatic disorders of both mitral and tricuspid valves: Secondary | ICD-10-CM

## 2024-05-27 DIAGNOSIS — Z7901 Long term (current) use of anticoagulants: Secondary | ICD-10-CM

## 2024-05-27 NOTE — Progress Notes (Signed)
 Subjective:   [name] is a 83 y.o. who presents for a Medicare Wellness preventive visit.  As a reminder, Annual Wellness Visits don't include a physical exam, and some assessments may be limited, especially if this visit is performed virtually. We may recommend an in-person follow-up visit with your provider if needed.  Visit Complete: Virtual I connected with  [name] on 05/27/24 by a audio enabled telemedicine application and verified that I am speaking with the correct person using two identifiers.  Patient Location: Home  Provider Location: Office/Clinic  I discussed the limitations of evaluation and management by telemedicine. The patient expressed understanding and agreed to proceed.  Vital Signs: Because this visit was a virtual/telehealth visit, some criteria may be missing or patient reported. Any vitals not documented were not able to be obtained and vitals that have been documented are patient reported.  VideoDeclined- This patient declined Librarian, academic. Therefore the visit was completed with audio only.  Persons Participating in Visit: Patient.  AWV Questionnaire: No: Patient Medicare AWV questionnaire was not completed prior to this visit.  Cardiac Risk Factors include: advanced age (>40men, >59 women);diabetes mellitus;hypertension;dyslipidemia;obesity (BMI >30kg/m2)     Objective:    There were no vitals filed for this visit. There is no height or weight on file to calculate BMI.     05/27/2024   12:45 PM 05/18/2024    4:58 AM 04/24/2024    6:29 AM 03/10/2024   11:23 PM 03/09/2024    6:43 PM 03/05/2024    5:10 PM 12/21/2023    2:35 PM  Advanced Directives  Does Patient Have a Medical Advance Directive? No No No Yes Yes Yes Yes  Type of Network engineer Power of [employer] Power of Attorney  Does patient want to make changes to medical  advance directive?    No - Patient declined   No - Patient declined  Copy of Healthcare Power of Attorney in Chart?    No - copy requested   No - copy requested  Would patient like information on creating a medical advance directive? No - Patient declined  No - Patient declined        Current Medications (verified) Outpatient Encounter Medications as of 05/27/2024  Medication Sig   acetaminophen  (TYLENOL ) 500 MG tablet Take 1,000 mg by mouth every 6 (six) hours as needed for mild pain or moderate pain.   albuterol  (VENTOLIN  HFA) 108 (90 Base) MCG/ACT inhaler Inhale 2 puffs into the lungs every 6 (six) hours as needed for wheezing or shortness of breath.   allopurinol  (ZYLOPRIM ) 100 MG tablet Take 100 mg by mouth daily.   apixaban  (ELIQUIS ) 2.5 MG TABS tablet Take 1 tablet (2.5 mg total) by mouth 2 (two) times daily.   azelastine  (ASTELIN ) 0.1 % nasal spray Place 2 sprays into both nostrils 2 (two) times daily. Use in each nostril as directed   epoetin  alfa-epbx (RETACRIT ) 4000 UNIT/ML injection Inject 1 mL (4,000 Units total) into the vein every Monday, Wednesday, and Friday at 6 PM.   esomeprazole  (NEXIUM ) 40 MG capsule Take 1 capsule (40 mg total) by mouth daily before breakfast.   ferrous sulfate  325 (65 FE) MG tablet Take 325 mg by mouth daily with breakfast.   fluticasone  (FLONASE ) 50 MCG/ACT nasal spray Place 2 sprays into both nostrils daily.   fluticasone  furoate-vilanterol (BREO ELLIPTA ) 200-25 MCG/ACT AEPB Inhale 1 puff into  the lungs daily.   folic acid  (FOLVITE ) 1 MG tablet Take 1 tablet (1 mg total) by mouth daily.   ipratropium-albuterol  (DUONEB) 0.5-2.5 (3) MG/3ML SOLN INHALE THE CONTENTS OF ONE (1) VIAL VIA NEBULIZATION EVERY 6 HOURS AS NEEDED FORSHORTNESS OF BREATH   levothyroxine  (SYNTHROID ) 150 MCG tablet Take 150 mcg by mouth every morning.   lidocaine -prilocaine  (EMLA ) cream as directed. Before dialysis   loperamide  (IMODIUM ) 2 MG capsule Take 1 capsule (2 mg total) by mouth  as needed for diarrhea or loose stools.   metoprolol  tartrate (LOPRESSOR ) 25 MG tablet Take 0.5 tablets (12.5 mg total) by mouth 2 (two) times daily.   montelukast  (SINGULAIR ) 10 MG tablet Take 1 tablet (10 mg total) by mouth at bedtime.   Multiple Minerals-Vitamins (CALCIUM  & VIT D3 BONE HEALTH PO) Take 1 tablet by mouth daily. 600 mg/ 25 mg   nystatin powder Apply 1 Application topically 3 (three) times daily.   rosuvastatin  (CRESTOR ) 40 MG tablet Take 40 mg by mouth daily.   senna-docusate (SENOKOT-S) 8.6-50 MG tablet Take 2 tablets by mouth at bedtime as needed for mild constipation.   sertraline  (ZOLOFT ) 100 MG tablet TAKE 1 TABLET BY MOUTH DAILY   torsemide  (DEMADEX ) 20 MG tablet Take 40 mg by mouth daily.   vitamin B-12 (CYANOCOBALAMIN ) 500 MCG tablet Take 500 mcg by mouth daily.   No facility-administered encounter medications on file as of 05/27/2024.    Allergies (verified) Ace inhibitors, Egg-derived products, Other, Prednisone , Risedronate, Sulfa antibiotics, and Sulfasalazine   History: Past Medical History:  Diagnosis Date   Actinic keratosis    Albuminuria    Allergy    Anemia    Arthritis    Asthma    Basal cell carcinoma 04/12/2017   Above right lateral brow. Nodulocystic type. EDC   Cancer (HCC)    skin   Cataract cortical, senile    CHF (congestive heart failure) (HCC)    COPD (chronic obstructive pulmonary disease) (HCC)    Diabetes mellitus without complication (HCC)    GERD (gastroesophageal reflux disease)    Hemorrhoids    History of kidney stones    Hyperlipidemia    Hypertension    Hypothyroidism    Lyme disease    No kidney function    OSA (obstructive sleep apnea)    Osteoporosis    Osteoporosis    Oxygen deficiency July 2025   Reflux esophagitis    Sleep apnea    Steatohepatitis    Steatohepatitis    Past Surgical History:  Procedure Laterality Date   A/V FISTULAGRAM N/A 12/31/2023   Procedure: A/V Fistulagram;  Surgeon: Marea Selinda RAMAN,  MD;  Location: [facility] INVASIVE CV LAB;  Service: Cardiovascular;  Laterality: N/A;   ABDOMINAL HYSTERECTOMY     APPENDECTOMY     AV FISTULA PLACEMENT Left 05/19/2022   Procedure: ARTERIOVENOUS (AV) FISTULA CREATION ( BRACHIAL CEPHALIC );  Surgeon: Jama Cordella MATSU, MD;  Location: [facility] ORS;  Service: Vascular;  Laterality: Left;   CARDIAC CATHETERIZATION  1980   [facility]   CARDIAC CATHETERIZATION  08/13/2014   [facility]. no significant CAD, normal LVEDP.    CATARACT EXTRACTION     CHOLECYSTECTOMY     COLONOSCOPY     COLONOSCOPY WITH PROPOFOL  N/A 12/07/2016   Procedure: COLONOSCOPY WITH PROPOFOL ;  Surgeon: Gladis RAYMOND Mariner, MD;  Location: [facility] ENDOSCOPY;  Service: Endoscopy;  Laterality: N/A;   DIALYSIS/PERMA CATHETER INSERTION N/A 12/31/2023   Procedure: DIALYSIS/PERMA CATHETER INSERTION;  Surgeon: Marea Selinda RAMAN, MD;  Location: [facility] INVASIVE CV LAB;  Service: Cardiovascular;  Laterality: N/A;   ESOPHAGOGASTRODUODENOSCOPY     ESOPHAGOGASTRODUODENOSCOPY (EGD) WITH PROPOFOL  N/A 12/07/2016   Procedure: ESOPHAGOGASTRODUODENOSCOPY (EGD) WITH PROPOFOL ;  Surgeon: Gladis RAYMOND Mariner, MD;  Location: [facility] ENDOSCOPY;  Service: Endoscopy;  Laterality: N/A;   ESOPHAGOGASTRODUODENOSCOPY (EGD) WITH PROPOFOL  N/A 01/07/2018   Procedure: ESOPHAGOGASTRODUODENOSCOPY (EGD) WITH PROPOFOL ;  Surgeon: Mariner Gladis RAYMOND, MD;  Location: [facility] ENDOSCOPY;  Service: Endoscopy;  Laterality: N/A;   EYE SURGERY     HEMATOMA EVACUATION Right 08/02/2023   Procedure: EVACUATION HEMATOMA;  Surgeon: Jordis Laneta FALCON, MD;  Location: [facility] ORS;  Service: General;  Laterality: Right;   HEMORRHOID SURGERY     PARTIAL HYSTERECTOMY     THYROIDECTOMY N/A 08/25/2019   Procedure: THYROIDECTOMY EXTRACTION OF SUBTOTAL COMPONENT; PARATHYROID  AUTOTRANSPLANT X1;  Surgeon: Marolyn Nest, MD;  Location: [facility] ORS;  Service: General;  Laterality: N/A;  With Nerve Monitoring(RLN)   TONSILLECTOMY     Family History  Problem Relation Age of Onset   Cancer Mother         breast   Kidney disease Mother    Breast cancer Mother    Diabetes Mother    Heart disease Mother    Hypertension Mother    Hypertension Father    Heart attack Father    Cancer Father    COPD Father    Arthritis Maternal Grandfather    Kidney disease Maternal Uncle    Social History   Socioeconomic History   Marital status: Married    Spouse name: Not on file   Number of children: Not on file   Years of education: Not on file   Highest education level: Associate degree: academic program  Occupational History   Not on file  Tobacco Use   Smoking status: Never   Smokeless tobacco: Never  Vaping Use   Vaping status: Never Used  Substance and Sexual Activity   Alcohol use: No   Drug use: No   Sexual activity: Not Currently    Birth control/protection: None  Other Topics Concern   Not on file  Social History Narrative   Lives at home with husband .   Social Drivers of Corporate investment banker Strain: Low Risk  (05/27/2024)   Overall Financial Resource Strain (CARDIA)    Difficulty of Paying Living Expenses: Not hard at all  Recent Concern: Financial Resource Strain - Medium Risk (04/14/2024)   Received from [facility]   Overall Financial Resource Strain (CARDIA)    Difficulty of Paying Living Expenses: Somewhat hard  Food Insecurity: No Food Insecurity (05/27/2024)   Hunger Vital Sign    Worried About Running Out of Food in the Last Year: Never true    Ran Out of Food in the Last Year: Never true  Transportation Needs: No Transportation Needs (05/27/2024)   PRAPARE - Administrator, Civil Service (Medical): No    Lack of Transportation (Non-Medical): No  Recent Concern: Transportation Needs - Unmet Transportation Needs (04/25/2024)   PRAPARE - Transportation    Lack of Transportation (Medical): Yes    Lack of Transportation (Non-Medical): Yes  Physical Activity: Insufficiently Active (05/27/2024)   Exercise Vital Sign    Days  of Exercise per Week: 7 days    Minutes of Exercise per Session: 20 min  Stress: No Stress Concern Present (05/27/2024)   [employer] of Occupational Health - Occupational Stress Questionnaire    Feeling of Stress: Only a little  Social Connections: Moderately Isolated (05/27/2024)   Social Connection and Isolation Panel    Frequency of Communication with Friends and Family: More than three times a week    Frequency of Social Gatherings with Friends and Family: More than three times a week    Attends Religious Services: Never    Database administrator or Organizations: No    Attends Engineer, structural: Never    Marital Status: Married    Tobacco Counseling Counseling given: Not Answered    Clinical Intake:  Pre-visit preparation completed: Yes  Pain : No/denies pain     BMI - recorded: 30 Nutritional Status: BMI > 30  Obese Nutritional Risks: None Diabetes: No  Lab Results  Component Value Date   HGBA1C 5.4 02/21/2024   HGBA1C 6.0 (H) 06/14/2022   HGBA1C 6.4 (H) 08/25/2019     How often do you need to have someone help you when you read instructions, pamphlets, or other written materials from your doctor or pharmacy?: 1 - Never  Interpreter Needed?: No  Information entered by :: JHONNIE DAS, LPN   Activities of Daily Living     05/27/2024   12:47 PM 04/25/2024    5:16 PM  In your present state of health, do you have any difficulty performing the following activities:  Hearing? 0 1  Vision? 0 0  Difficulty concentrating or making decisions? 0 0  Walking or climbing stairs? 0   Dressing or bathing? 0   Doing errands, shopping? 0   Preparing Food and eating ? N   Using the Toilet? N   In the past six months, have you accidently leaked urine? N   Do you have problems with loss of bowel control? N   Managing your Medications? N   Managing your Finances? N   Housekeeping or managing your Housekeeping? N     Patient Care Team: Ostwalt,  Janna, PA-C as PCP - General (Physician Assistant) Darron Deatrice LABOR, MD as PCP - Cardiology (Cardiology) Agrawal, Kavita, MD as Consulting Physician (Oncology) Kasa, Kurian, MD as Consulting Physician (Pulmonary Disease) Lateef, Munsoor, MD (Nephrology) Jaye Fallow, MD as Referring Physician (Ophthalmology)  I have updated your Care Teams any recent Medical Services you may have received from other providers in the past year.     Assessment:   This is a routine wellness examination for [facility].  Hearing/Vision screen Hearing Screening - Comments:: NO AIDS Vision Screening - Comments:: READERS- [facility]- DR.PORFILIO- GET SHOTS IN EYE   Goals Addressed             This Visit's Progress    DIET - EAT MORE FRUITS AND VEGETABLES         Depression Screen     05/27/2024   12:42 PM 05/08/2024   10:12 AM 03/27/2024    3:18 PM 02/15/2024   10:33 AM 08/14/2023   11:17 AM  PHQ 2/9 Scores  PHQ - 2 Score 0 0 0 2 0  PHQ- 9 Score 0 2 2 10      Fall Risk     05/27/2024   12:47 PM 05/08/2024   10:12 AM 04/14/2024    1:55 PM 02/15/2024   10:35 AM 09/09/2019    1:32 PM  Fall Risk   Falls in the past year? 0 0 0 1 0   Number falls in past yr: 0 0  1   Injury with Fall? 0 0  1   Risk for fall due to : No  Fall Risks No Fall Risks     Follow up Falls evaluation completed;Falls prevention discussed         Data saved with a previous flowsheet row definition    MEDICARE RISK AT HOME:  Medicare Risk at Home Any stairs in or around the home?: Yes If so, are there any without handrails?: No Home free of loose throw rugs in walkways, pet beds, electrical cords, etc?: Yes Adequate lighting in your home to reduce risk of falls?: Yes Life alert?: No Use of a cane, walker or w/c?: Yes (CANE WHEN AWAY FROM HOME) Grab bars in the bathroom?: Yes Shower chair or bench in shower?: Yes Elevated toilet seat or a handicapped toilet?: Yes  TIMED UP AND GO:  Was the test performed?   No  Cognitive Function: 6CIT completed        05/27/2024   12:50 PM  6CIT Screen  What Year? 0 points  What month? 0 points  What time? 0 points  Count back from 20 0 points  Months in reverse 0 points  Repeat phrase 0 points  Total Score 0 points    Immunizations Immunization History  Administered Date(s) Administered   Fluad Quad(high Dose 65+) 08/26/2019   Influenza Inj Mdck Quad Pf 08/15/2017, 08/12/2020, 06/28/2022   Influenza, Mdck, Trivalent,PF 6+ MOS(egg free) 07/25/2023   Influenza-Unspecified 08/15/2017, 08/15/2017, 08/15/2018, 09/02/2021   Pneumococcal Conjugate-13 03/26/2017   Pneumococcal Polysaccharide-23 08/26/2019   Tdap 09/21/2015    Screening Tests Health Maintenance  Topic Date Due   COVID-19 Vaccine (1) Never done   FOOT EXAM  Never done   OPHTHALMOLOGY EXAM  Never done   Zoster Vaccines- Shingrix (1 of 2) Never done   DEXA SCAN  Never done   INFLUENZA VACCINE  05/02/2024   HEMOGLOBIN A1C  08/23/2024   Medicare Annual Wellness (AWV)  05/27/2025   DTaP/Tdap/Td (2 - Td or Tdap) 09/20/2025   Pneumococcal Vaccine: 50+ Years  Completed   HPV VACCINES  Aged Out   Meningococcal B Vaccine  Aged Out    Health Maintenance  Health Maintenance Due  Topic Date Due   COVID-19 Vaccine (1) Never done   FOOT EXAM  Never done   OPHTHALMOLOGY EXAM  Never done   Zoster Vaccines- Shingrix (1 of 2) Never done   DEXA SCAN  Never done   INFLUENZA VACCINE  05/02/2024   Health Maintenance Items Addressed: AGED OUT OF MAMMOGRAM & COLONOSCOPY; DECLINES BDS, COVID & SHINGRIX- UP TO DATE ON PNA & TDAP  Additional Screening:  Vision Screening: Recommended annual ophthalmology exams for early detection of glaucoma and other disorders of the eye. Would you like a referral to an eye doctor? No    Dental Screening: Recommended annual dental exams for proper oral hygiene  Community Resource Referral / Chronic Care Management: CRR required this visit?  No   CCM  required this visit?  No   Plan:    I have personally reviewed and noted the following in the patient's chart:   Medical and social history Use of alcohol, tobacco or illicit drugs  Current medications and supplements including opioid prescriptions. Patient is not currently taking opioid prescriptions. Functional ability and status Nutritional status Physical activity Advanced directives List of other physicians Hospitalizations, surgeries, and ER visits in previous 12 months Vitals Screenings to include cognitive, depression, and falls Referrals and appointments  In addition, I have reviewed and discussed with patient certain preventive protocols, quality metrics, and best practice recommendations. A written  personalized care plan for preventive services as well as general preventive health recommendations were provided to patient.   Jhonnie GORMAN Das, LPN   1/[id number]/[id number]   After Visit Summary: (MyChart) Due to this being a telephonic visit, the after visit summary with patients personalized plan was offered to patient via MyChart   Notes: Nothing significant to report at this time.

## 2024-05-27 NOTE — Patient Instructions (Signed)
 [name] , Thank you for taking time out of your busy schedule to complete your Annual Wellness Visit with me. I enjoyed our conversation and look forward to speaking with you again next year. I, as well as your care team,  appreciate your ongoing commitment to your health goals. Please review the following plan we discussed and let me know if I can assist you in the future.  Follow up Visits: 06/02/25 @11 :30 AM BY PHONE We will see or speak with you next year for your Next Medicare AWV with our clinical staff Have you seen your provider in the last 6 months (3 months if uncontrolled diabetes)? Yes  Clinician Recommendations:  Aim for 30 minutes of exercise or brisk walking, 6-8 glasses of water, and 5 servings of fruits and vegetables each day. Take care!      This is a list of the screenings recommended for you:  Health Maintenance  Topic Date Due   COVID-19 Vaccine (1) Never done   Complete foot exam   Never done   Eye exam for diabetics  Never done   Zoster (Shingles) Vaccine (1 of 2) Never done   DEXA scan (bone density measurement)  Never done   Flu Shot  05/02/2024   Hemoglobin A1C  08/23/2024   Medicare Annual Wellness Visit  05/27/2025   DTaP/Tdap/Td vaccine (2 - Td or Tdap) 09/20/2025   Pneumococcal Vaccine for age over 58  Completed   HPV Vaccine  Aged Out   Meningitis B Vaccine  Aged Out    Advanced directives: (ACP Link)Information on Advanced Care Planning can be found at [place]  Print production planner Health Care Directives Advance Health Care Directives. [email or web address]  Advance Care Planning is important because it:  [x]  Makes sure you receive the medical care that is consistent with your values, goals, and preferences  [x]  It provides guidance to your family and loved ones and reduces their decisional burden about whether or not they are making the right decisions based on your wishes.  Follow the link provided in your after visit summary or read over the  paperwork we have mailed to you to help you started getting your Advance Directives in place. If you need assistance in completing these, please reach out to us  so that we can help you!

## 2024-05-27 NOTE — Transitions of Care (Post Inpatient/ED Visit) (Signed)
   05/27/2024  Name: [name] MRN: [id number] DOB: 1940/12/27  Today's TOC FU Call Status: Today's TOC FU Call Status:: Unsuccessful Call (2nd Attempt) Unsuccessful Call (2nd Attempt) Date: 05/27/24  Attempted to reach the patient regarding the most recent Inpatient/ED visit. Spoke with the patient via telephone, HIPAA appropriate. Patient states, I had several calls today and I am out driving to get groceries since I got out of the hospital Friday and can't really talk right now can you try tomorrow before I go to dialysis/  Follow Up Plan: Additional outreach attempts will be made to reach the patient to complete the Transitions of Care (Post Inpatient/ED visit) call.   Richerd Fish, RN, BSN, CCM [facility], [facility] RN Care Manager Direct Dial: [phone]

## 2024-05-28 ENCOUNTER — Encounter: Payer: Self-pay | Admitting: Internal Medicine

## 2024-05-28 ENCOUNTER — Telehealth: Payer: Self-pay

## 2024-05-28 NOTE — Transitions of Care (Post Inpatient/ED Visit) (Signed)
   05/28/2024  Name: JACQUELINA HEWINS MRN: [id number] DOB: [birth year]  Today's TOC FU Call Status: Unsuccessful Call (3rd Attempt) Date: 05/28/24  Attempted to reach the patient regarding the most recent Inpatient/ED visit. Left a HIPAA approved voicemail message to phone number provided in demographics per DPR.    Follow Up Plan: No Additional outreach attempts will be made to reach the patient to complete the Transitions of Care (Post Inpatient/ED visit) call.   Richerd Fish, RN, BSN, CCM [facility] RN Care Manager Direct Dial: [phone]

## 2024-05-29 ENCOUNTER — Encounter (INDEPENDENT_AMBULATORY_CARE_PROVIDER_SITE_OTHER): Payer: Medicare Other

## 2024-05-29 ENCOUNTER — Other Ambulatory Visit: Payer: Self-pay | Admitting: Physician Assistant

## 2024-05-29 ENCOUNTER — Ambulatory Visit (INDEPENDENT_AMBULATORY_CARE_PROVIDER_SITE_OTHER): Payer: Medicare Other | Admitting: Vascular Surgery

## 2024-06-04 ENCOUNTER — Encounter: Payer: Self-pay | Admitting: Internal Medicine

## 2024-06-19 ENCOUNTER — Encounter: Payer: Self-pay | Admitting: Physician Assistant

## 2024-06-19 ENCOUNTER — Ambulatory Visit (INDEPENDENT_AMBULATORY_CARE_PROVIDER_SITE_OTHER): Admitting: Physician Assistant

## 2024-06-19 VITALS — BP 129/48 | HR 64 | Ht 62.0 in | Wt 168.0 lb

## 2024-06-19 DIAGNOSIS — F32A Depression, unspecified: Secondary | ICD-10-CM | POA: Diagnosis not present

## 2024-06-19 DIAGNOSIS — D638 Anemia in other chronic diseases classified elsewhere: Secondary | ICD-10-CM

## 2024-06-19 DIAGNOSIS — K219 Gastro-esophageal reflux disease without esophagitis: Secondary | ICD-10-CM

## 2024-06-19 DIAGNOSIS — N186 End stage renal disease: Secondary | ICD-10-CM

## 2024-06-19 DIAGNOSIS — G4733 Obstructive sleep apnea (adult) (pediatric): Secondary | ICD-10-CM

## 2024-06-19 DIAGNOSIS — E7849 Other hyperlipidemia: Secondary | ICD-10-CM

## 2024-06-19 DIAGNOSIS — E1159 Type 2 diabetes mellitus with other circulatory complications: Secondary | ICD-10-CM

## 2024-06-19 DIAGNOSIS — E039 Hypothyroidism, unspecified: Secondary | ICD-10-CM | POA: Diagnosis not present

## 2024-06-19 DIAGNOSIS — E669 Obesity, unspecified: Secondary | ICD-10-CM

## 2024-06-19 DIAGNOSIS — J45909 Unspecified asthma, uncomplicated: Secondary | ICD-10-CM

## 2024-06-19 DIAGNOSIS — E1169 Type 2 diabetes mellitus with other specified complication: Secondary | ICD-10-CM

## 2024-06-19 DIAGNOSIS — I152 Hypertension secondary to endocrine disorders: Secondary | ICD-10-CM

## 2024-06-19 NOTE — Progress Notes (Unsigned)
 Established patient visit  Patient: [name]   DOB: August 15, 1941   [name]  MRN: [id number] Visit Date: 06/19/2024  Today's healthcare provider: Jolynn Spencer, PA-C   Chief Complaint  Patient presents with   Medical Management of Chronic Issues    Patient reports she is now using oxygen at night to sleep and that forms will be sent over to provider in regards to her oxygen   Hypothyroidism    She reports taking medications as prescribed with no symptoms to report   Anxiety   Subjective     HPI     Medical Management of Chronic Issues    Additional comments: Patient reports she is now using oxygen at night to sleep and that forms will be sent over to provider in regards to her oxygen        Hypothyroidism    Additional comments: She reports taking medications as prescribed with no symptoms to report      Last edited by Lilian Fitzpatrick, CMA on 06/19/2024  3:24 PM.       Discussed the use of AI scribe software for clinical note transcription with the patient, who gave verbal consent to proceed.  History of Present Illness [name] is an 83 year old female with pulmonary disease who presents for medication management and follow-up.  She uses a CPAP machine nightly and during weather changes, which aids in symptom management. She was hospitalized in June for breathing treatments every six hours. Her pulmonologist prescribes albuterol  and Duoneb, and manages her inhalers.  Her medications include sertraline , levothyroxine , Nexium , metoprolol , amlodipine , hydralazine , montelukast , and azelastine . Hydralazine  was adjusted during her hospital stay. Her blood pressure was slightly elevated recently but improved with medication adjustments.  She experiences stress due to her husband's dementia and a recent incident where he was found driving unsupervised. She engages in social activities like driving short distances and cooking, but avoids long drives due to health  concerns. She manages her blood sugar levels while enjoying sweets occasionally.       05/27/2024   12:42 PM 05/08/2024   10:12 AM 03/27/2024    3:18 PM  Depression screen PHQ 2/9  Decreased Interest 0 0 0  Down, Depressed, Hopeless 0 0 0  PHQ - 2 Score 0 0 0  Altered sleeping 0 1 1  Tired, decreased energy 0 1 1  Change in appetite 0 0 0  Feeling bad or failure about yourself  0 0 0  Trouble concentrating 0 0 0  Moving slowly or fidgety/restless 0 0 0  Suicidal thoughts 0 0 0  PHQ-9 Score 0 2 2  Difficult doing work/chores Not difficult at all Not difficult at all Not difficult at all      05/08/2024   10:12 AM 03/27/2024    3:18 PM 02/15/2024   10:34 AM  GAD 7 : Generalized Anxiety Score  Nervous, Anxious, on Edge 0 0 0  Control/stop worrying 0 0 0  Worry too much - different things 0 0   Trouble relaxing 0 0 2  Restless 0 0 3  Easily annoyed or irritable 1 0 0  Afraid - awful might happen 0 0 0  Total GAD 7 Score 1 0   Anxiety Difficulty Not difficult at all Not difficult at all Not difficult at all    Medications: Outpatient Medications Prior to Visit  Medication Sig   acetaminophen  (TYLENOL ) 500 MG tablet Take 1,000 mg by mouth every 6 (six) hours as needed  for mild pain or moderate pain.   albuterol  (VENTOLIN  HFA) 108 (90 Base) MCG/ACT inhaler Inhale 2 puffs into the lungs every 6 (six) hours as needed for wheezing or shortness of breath.   allopurinol  (ZYLOPRIM ) 100 MG tablet Take 100 mg by mouth daily.   apixaban  (ELIQUIS ) 2.5 MG TABS tablet Take 1 tablet (2.5 mg total) by mouth 2 (two) times daily.   azelastine  (ASTELIN ) 0.1 % nasal spray Place 2 sprays into both nostrils 2 (two) times daily. Use in each nostril as directed   epoetin  alfa-epbx (RETACRIT ) 4000 UNIT/ML injection Inject 1 mL (4,000 Units total) into the vein every Monday, Wednesday, and Friday at 6 PM.   esomeprazole  (NEXIUM ) 40 MG capsule Take 1 capsule (40 mg total) by mouth daily before breakfast.    ferrous sulfate  325 (65 FE) MG tablet Take 325 mg by mouth daily with breakfast.   fluticasone  (FLONASE ) 50 MCG/ACT nasal spray Place 2 sprays into both nostrils daily.   fluticasone  furoate-vilanterol (BREO ELLIPTA ) 200-25 MCG/ACT AEPB Inhale 1 puff into the lungs daily.   folic acid  (FOLVITE ) 1 MG tablet Take 1 tablet (1 mg total) by mouth daily.   ipratropium-albuterol  (DUONEB) 0.5-2.5 (3) MG/3ML SOLN INHALE THE CONTENTS OF ONE (1) VIAL VIA NEBULIZATION EVERY 6 HOURS AS NEEDED FORSHORTNESS OF BREATH   levothyroxine  (SYNTHROID ) 150 MCG tablet TAKE 1 TABLET BY MOUTH ONCE DAILY. TAKE ON AN EMPTY STOMACH WITH A GLASS OF WATER AT LEAST 30-60 MINUTES BEFORE BREAKFAST   lidocaine -prilocaine  (EMLA ) cream as directed. Before dialysis   loperamide  (IMODIUM ) 2 MG capsule Take 1 capsule (2 mg total) by mouth as needed for diarrhea or loose stools.   metoprolol  tartrate (LOPRESSOR ) 25 MG tablet Take 0.5 tablets (12.5 mg total) by mouth 2 (two) times daily.   montelukast  (SINGULAIR ) 10 MG tablet Take 1 tablet (10 mg total) by mouth at bedtime.   Multiple Minerals-Vitamins (CALCIUM  & VIT D3 BONE HEALTH PO) Take 1 tablet by mouth daily. 600 mg/ 25 mg   nystatin powder Apply 1 Application topically 3 (three) times daily.   rosuvastatin  (CRESTOR ) 40 MG tablet Take 40 mg by mouth daily.   senna-docusate (SENOKOT-S) 8.6-50 MG tablet Take 2 tablets by mouth at bedtime as needed for mild constipation.   sertraline  (ZOLOFT ) 100 MG tablet TAKE 1 TABLET BY MOUTH DAILY   torsemide  (DEMADEX ) 20 MG tablet TAKE TWO TABLETS BY MOUTH ONCE DAILY   vitamin B-12 (CYANOCOBALAMIN ) 500 MCG tablet Take 500 mcg by mouth daily.   No facility-administered medications prior to visit.    Review of Systems All negative Except see HPI       Objective    BP (!) 129/48 (BP Location: Right Arm, Patient Position: Sitting, Cuff Size: Normal)   Pulse 64   Ht 5' 2 (1.575 m)   Wt 168 lb (76.2 kg)   SpO2 97%   BMI 30.73 kg/m      Physical Exam Vitals reviewed.  Constitutional:      General: She is not in acute distress.    Appearance: Normal appearance. She is well-developed. She is not diaphoretic.  HENT:     Head: Normocephalic and atraumatic.  Eyes:     General: No scleral icterus.    Conjunctiva/sclera: Conjunctivae normal.  Neck:     Thyroid : No thyromegaly.  Cardiovascular:     Rate and Rhythm: Normal rate and regular rhythm.     Pulses: Normal pulses.     Heart sounds: Normal heart sounds. No  murmur heard. Pulmonary:     Effort: Pulmonary effort is normal. No respiratory distress.     Breath sounds: Normal breath sounds. No wheezing, rhonchi or rales.  Musculoskeletal:     Cervical back: Neck supple.     Right lower leg: No edema.     Left lower leg: No edema.  Lymphadenopathy:     Cervical: No cervical adenopathy.  Skin:    General: Skin is warm and dry.     Findings: No rash.  Neurological:     Mental Status: She is alert and oriented to person, place, and time. Mental status is at baseline.  Psychiatric:        Mood and Affect: Mood normal.        Behavior: Behavior normal.      No results found for any visits on 06/19/24.      Assessment & Plan Chronic kidney disease, on dialysis Chronic kidney disease managed with dialysis. No acute issues reported. Last GFR 11 Follow-up with dialysis center/nephrology  Hypertension/CHF Afib Chronic and stable Hypertension was managed with amlodipine , metoprolol , and hydralazine . Blood pressure control is well-managed. Amlodipine  and hydralazine  was d/c Continue with metoprolol  25mg  daily. Will reassure rx refill. Continue low salt diet and regular exercise Torsemide  20mg  continue for CHF Toprol  for rate control and Eliquis  2.5 bid for anticoagulation - for Afib Consider clinic pharmacist for polypharmacy and concern about compliance Will follow-up  Type 2 diabetes mellitus Chronic and stable Type 2 diabetes mellitus with recent  blood sugar level of 154 mg/dL. - Advise monitoring diet, particularly sugar intake. Continue current DM regimen Consider clinic pharmacist for polypharmacy! Will follow-up  Asthma/COPD Chronic and stable Asthma managed with inhalers including Breo > Duoneb and albuterol . Increased inhaler use due to seasonal changes and outdoor activities. Oxygen at night. Needs refill. Advised to communicate with Dr. Jenne Per discharge note from 05/23/24, triple therapy was advised for copd: Pulmicort and Duoneb Will follow-up  Obstructive sleep apnea, on CPAP Chronic Obstructive sleep apnea managed with CPAP. Will follow-up  Gastroesophageal reflux disease (GERD) Chronic GERD managed with Nexium . She reported running out of medication. - Prescribe Nexium  40mg . Continue lifestyle modifications Will follow-up  Hypothyroidism Chronic Hypothyroidism managed with levothyroxine  150 mcg. No symptoms of cold or heat intolerance reported. - Check TSH levels at the follow-up Will reassess the dose  Depression Chronic and stable Depression managed with sertraline  100 mg. No current symptoms of anxiety or depression reported. Will follow-up  General Health Maintenance Discussion on COVID-19 vaccine, shingles vaccine, bone density test, and eye exam. Concerns about COVID-19 vaccine and has not received shingles vaccine. Eye exam interrupted due to hospitalization. - Consider COVID-19 vaccine. - Consider shingles vaccine. - Consider bone density test. - Follow up with eye specialist for continued care.  Follow-up Follow-up planned in 2-3 months to reassess current conditions and perform lab work. - Schedule follow-up appointment in 2-3 months. - Perform lab work at next visit.  Hyperlipidemia Chronic and stable Should continue on crestor  Will follow-up  No orders of the defined types were placed in this encounter.   No follow-ups on file.   The patient was advised to call back  or seek an in-person evaluation if the symptoms worsen or if the condition fails to improve as anticipated.  I discussed the assessment and treatment plan with the patient. The patient was provided an opportunity to ask questions and all were answered. The patient agreed with the plan and demonstrated an understanding of the  instructions.  I, Arley Garant, PA-C have reviewed all documentation for this visit. The documentation on 06/19/2024  for the exam, diagnosis, procedures, and orders are all accurate and complete.  Jolynn Spencer, [facility], MMS [facility] [phone] (phone) [phone] (fax)  [facility]

## 2024-06-24 ENCOUNTER — Encounter: Payer: Self-pay | Admitting: Internal Medicine

## 2024-06-24 ENCOUNTER — Ambulatory Visit (INDEPENDENT_AMBULATORY_CARE_PROVIDER_SITE_OTHER): Admitting: Internal Medicine

## 2024-06-24 VITALS — BP 120/80 | HR 69 | Ht 62.0 in | Wt 167.4 lb

## 2024-06-24 DIAGNOSIS — G4733 Obstructive sleep apnea (adult) (pediatric): Secondary | ICD-10-CM

## 2024-06-24 DIAGNOSIS — J449 Chronic obstructive pulmonary disease, unspecified: Secondary | ICD-10-CM

## 2024-06-24 DIAGNOSIS — J9611 Chronic respiratory failure with hypoxia: Secondary | ICD-10-CM | POA: Diagnosis not present

## 2024-06-24 NOTE — Progress Notes (Unsigned)
 Name: KACY HEGNA MRN: [id number] DOB: 12/11/1940    CHIEF COMPLAINT:  Follow-up assessment for OSA Assessment for COPD  Assessment for chronic hypoxic respiratory failure   HISTORY OF PRESENT ILLNESS: Diagnosed with OSA 20 years ago Has mild OSA with an AHI of 6 However patient states that her PCP helps her tremendously  Discussed sleep data and reviewed with patient.  Encouraged proper weight management.  Discussed driving precautions and its relationship with hypersomnolence.  Discussed sleep hygiene, and benefits of a fixed sleep waked time.  The importance of getting eight or more hours of sleep discussed with patient.  Discussed limiting the use of the computer and television before bedtime.  Decrease naps during the day, so night time sleep will become enhanced.  Limit caffeine, and sleep deprivation.   Patient uses and benefits from therapy Using CPAP nightly and with naps Pressure setting is comfortable and is sleeping well. AHI reduced to 0.4 Auto CPAP 5-20 Excellent compliance report  Regarding COPD Pulmonary function test reviewed in detail Patient seems to have small obstructive airways disease No significant abnormality however patient requires Advair as needed Patient uses Breo as well as nebulizers as needed Patient with severe kyphosis which also contributes to some shortness of breath with restrictive lung condition Recommend using incentive spirometry 10 times a day  No exacerbation at this time No evidence of heart failure at this time No evidence or signs of infection at this time No respiratory distress No fevers, chills, nausea, vomiting, diarrhea No evidence of lower extremity edema No evidence hemoptysis   Patient is a dialysis patient Monday Wednesday Friday  PAST MEDICAL HISTORY :   has a past medical history of Actinic keratosis, Albuminuria, Allergy, Anemia, Arthritis, Asthma, Basal cell carcinoma (04/12/2017), Cancer (HCC), Cataract  cortical, senile, CHF (congestive heart failure) (HCC), COPD (chronic obstructive pulmonary disease) (HCC), Diabetes mellitus without complication (HCC), GERD (gastroesophageal reflux disease), Hemorrhoids, History of kidney stones, Hyperlipidemia, Hypertension, Hypothyroidism, Lyme disease, No kidney function, OSA (obstructive sleep apnea), Osteoporosis, Osteoporosis, Oxygen deficiency (July 2025), Reflux esophagitis, Sleep apnea, Steatohepatitis, and Steatohepatitis.  has a past surgical history that includes Appendectomy; Cataract extraction; Tonsillectomy; Cholecystectomy; Partial hysterectomy; Cardiac catheterization (1980); Cardiac catheterization (08/13/2014); Colonoscopy; Esophagogastroduodenoscopy; Hemorrhoid surgery; Colonoscopy with propofol  (N/A, 12/07/2016); Esophagogastroduodenoscopy (egd) with propofol  (N/A, 12/07/2016); Abdominal hysterectomy; Eye surgery; Esophagogastroduodenoscopy (egd) with propofol  (N/A, 01/07/2018); Thyroidectomy (N/A, 08/25/2019); AV fistula placement (Left, 05/19/2022); Hematoma evacuation (Right, 08/02/2023); A/V Fistulagram (N/A, 12/31/2023); and DIALYSIS/PERMA CATHETER INSERTION (N/A, 12/31/2023). Prior to Admission medications   Medication Sig Start Date End Date Taking? Authorizing Provider  acetaminophen  (TYLENOL ) 500 MG tablet Take 1,000 mg by mouth every 6 (six) hours as needed for mild pain or moderate pain.    [provider]  albuterol  (PROVENTIL  HFA;VENTOLIN  HFA) 108 (90 BASE) MCG/ACT inhaler Inhale 2 puffs into the lungs every 6 (six) hours as needed for wheezing or shortness of breath.    [provider]  amLODipine  (NORVASC ) 5 MG tablet Take 1 tablet (5 mg total) by mouth daily. 08/06/23 08/05/24  Alexander, Natalie, DO  apixaban  (ELIQUIS ) 2.5 MG TABS tablet Take 1 tablet (2.5 mg total) by mouth 2 (two) times daily. 06/04/23 06/03/24  Von Bellis, MD  calcium  carbonate (TUMS - DOSED IN MG ELEMENTAL CALCIUM ) 500 MG chewable tablet Chew 1-2 tablets  (200-400 mg of elemental calcium  total) by mouth 3 (three) times daily as needed for indigestion or heartburn. 08/06/23   Alexander, Natalie, DO  cyanocobalamin  (VITAMIN B12) 500 MCG tablet  Take 1 tablet (500 mcg total) by mouth daily. 06/05/23 09/03/23  Von Bellis, MD  esomeprazole  (NEXIUM ) 40 MG capsule Take 40 mg by mouth daily before breakfast.    [provider]  fluticasone  (FLONASE ) 50 MCG/ACT nasal spray Place 2 sprays into both nostrils daily.    [provider]  fluticasone -salmeterol (ADVAIR) 500-50 MCG/ACT AEPB Inhale 1 puff into the lungs in the morning and at bedtime.    [provider]  furosemide  (LASIX ) 40 MG tablet Take 1 tablet (40 mg total) by mouth 2 (two) times daily. 06/04/23 09/02/23  Von Bellis, MD  hydrALAZINE  (APRESOLINE ) 100 MG tablet Take 1 tablet (100 mg total) by mouth 3 (three) times daily. 04/22/23   Wouk, Devaughn Sayres, MD  ipratropium-albuterol  (DUONEB) 0.5-2.5 (3) MG/3ML SOLN Take 3 mLs by nebulization every 6 (six) hours as needed (SOB). 03/27/23   Caleen Qualia, MD  levothyroxine  (SYNTHROID ) 150 MCG tablet Take 150 mcg by mouth daily.    [provider]  lidocaine  (LIDODERM ) 5 % Place 1 patch onto the skin every 12 (twelve) hours. Remove & Discard patch within 12 hours or as directed by MD 06/08/22   Viviann Pastor, MD  loperamide  (IMODIUM ) 2 MG capsule Take 1 capsule (2 mg total) by mouth as needed for diarrhea or loose stools. 08/05/22   Amin, Sumayya, MD  metoprolol  tartrate (LOPRESSOR ) 50 MG tablet Take 25 mg by mouth 2 (two) times daily.    [provider]  montelukast  (SINGULAIR ) 10 MG tablet Take 10 mg by mouth at bedtime.    [provider]  Multiple Minerals-Vitamins (CALCIUM  & VIT D3 BONE HEALTH PO) Take 1 tablet by mouth daily. 600 mg/ 25 mg    [provider]  polyethylene glycol (MIRALAX  / GLYCOLAX ) 17 g packet Take 17 g by mouth daily as needed for mild constipation or moderate constipation.  08/06/23   Alexander, Natalie, DO  potassium chloride  (KLOR-CON ) 10 MEQ tablet Take 10 mEq by mouth daily. 05/30/23 05/29/24  [provider]  rosuvastatin  (CRESTOR ) 40 MG tablet Take 40 mg by mouth daily.    [provider]  senna-docusate (SENOKOT-S) 8.6-50 MG tablet Take 2 tablets by mouth at bedtime as needed for mild constipation. 08/06/23   Alexander, Natalie, DO  sertraline  (ZOLOFT ) 100 MG tablet Take 100 mg by mouth daily.    [provider]  Vitamin D , Ergocalciferol , (DRISDOL ) 1.25 MG (50000 UNIT) CAPS capsule Take 1 capsule (50,000 Units total) by mouth every 7 (seven) days. 06/10/23 09/08/23  Von Bellis, MD   Allergies  Allergen Reactions   Ace Inhibitors     Other reaction(s): Unknown   Egg-Derived Products Diarrhea   Other     Other reaction(s): Other (See Comments) Eggs   Prednisone      Other reaction(s): Other (See Comments) joint pain   Risedronate     Other reaction(s): Other (See Comments)   Sulfa Antibiotics Itching and Swelling    Other reaction(s): Other (See Comments)   Sulfasalazine Other (See Comments)    FAMILY HISTORY:  family history includes Arthritis in her maternal grandfather; Breast cancer in her mother; COPD in her father; Cancer in her father and mother; Diabetes in her mother; Heart attack in her father; Heart disease in her mother; Hypertension in her father and mother; Kidney disease in her maternal uncle and mother. SOCIAL HISTORY:  reports that she has never smoked. She has never used smokeless tobacco. She reports that she does not drink alcohol and does  not use drugs.  BP 120/80   Pulse 69   Ht 5' 2 (1.575 m)   Wt 167 lb 6.4 oz (75.9 kg)   SpO2 97%   BMI 30.62 kg/m       Review of Systems: Gen:  Denies  fever, sweats, chills weight loss  HEENT: Denies blurred vision, double vision, ear pain, eye pain, hearing loss, nose bleeds, sore throat Cardiac:  No dizziness, chest pain or heaviness, chest  tightness,edema, No JVD Resp:   No cough, -sputum production, -shortness of breath,-wheezing, -hemoptysis,  Other:  All other systems negative   Physical Examination:   General Appearance: No distress  EYES PERRLA, EOM intact.   NECK Supple, No JVD Pulmonary: normal breath sounds, No wheezing.  CardiovascularNormal S1,S2.  No m/r/g.   Abdomen: Benign, Soft, non-tender. Neurology UE/LE 5/5 strength, no focal deficits Ext pulses intact, cap refill intact +thrill and bruit auscultated Left arm ALL OTHER ROS ARE NEGATIVE    ASSESSMENT AND PLAN SYNOPSIS  83 year old pleasant white female seen today for follow-up assessment for COPD and OSA in setting of obesity and deconditioned state   Assessment of OSA Previous AHI  6 Continue CPAP as prescribed  Excellent compliance report Reviewed compliance report in detail with patient Patient definitely benefits the use of CPAP therapy as prescribed Using CPAP nightly and with naps Pressure setting is comfortable and is sleeping well. CPAP prescription 5-20 AHI reduced to 0.4  No evidence of acute heart failure at this time No respiratory distress No fevers, chills, nausea, vomiting, diarrhea No evidence hemoptysis  Patient Instructions Continue to use CPAP every night, minimum of 4-6 hours a night.  Change equipment every 30 days or as directed by DME.  Wash your tubing with warm soap and water daily, hang to dry. Wash humidifier portion weekly. Use bottled, distilled water and change daily   Be aware of reduced alertness and do not drive or operate heavy machinery if experiencing this or drowsiness.  Exercise encouraged, as tolerated. Encouraged proper weight management.  Important to get eight or more hours of sleep  Limiting the use of the computer and television before bedtime.  Decrease naps during the day, so night time sleep will become enhanced.  Limit caffeine, and sleep deprivation.  HTN, stroke, uncontrolled diabetes  and heart failure are potential risk factors.  Risk of untreated sleep apnea including cardiac arrhthymias, stroke, DM, pulm HTN.     Assessment of COPD Pulmonary function test reviewed in detail with the patient No significant bronchodilator response Normal ratio FEV1 to FVC ratio 75% predicted FEV1 88% predicted FVC 82% predicted Normal FEV1 and FVC DLCO within normal limits Patient may have some mild obstructive airways disease, patient with symptoms we will plan to continue with Breo and nebulized treatment as prescribed Restrictive lung physiology with severe kyphosis recommend incentive spirometry 10 times per day  Obesity -recommend significant weight loss -recommend changing diet  Deconditioned state -Recommend increased daily activity and exercise    MEDICATION ADJUSTMENTS/LABS AND TESTS ORDERED: Continue Inhalers as prescribed Continue CPAP as prescribed Avoid Allergens and Irritants Avoid secondhand smoke Avoid SICK contacts Recommend  Masking  when appropriate Recommend Keep up-to-date with vaccinations Incentive spirometry 10 times per day    CURRENT MEDICATIONS REVIEWED AT LENGTH WITH PATIENT TODAY   Patient  satisfied with Plan of action and management. All questions answered   Follow up 6 months   I spent a total of 43 minutes dedicated to the care of this patient  on the date of this encounter to include pre-visit review of records, face-to-face time with the patient discussing conditions above, post visit ordering of testing, clinical documentation with the electronic health record, making appropriate referrals as documented, and communicating necessary information to the patient's healthcare team.    The Patient requires high complexity decision making for assessment and support, frequent evaluation and titration of therapies, application of advanced monitoring technologies and extensive interpretation of multiple databases.  Patient satisfied with  Plan of action and management. All questions answered    Nickolas Alm Cellar, M.D.  [facility]  Medical Director [facility] Medical Director [facility] Cardio-Pulmonary Department

## 2024-06-24 NOTE — Patient Instructions (Signed)
 Excellent Job A+ GOLD STAR!!  Continue CPAP as prescribed  Patient Instructions Continue to use CPAP every night, minimum of 4-6 hours a night.  Change equipment every 30 days or as directed by DME.  Wash your tubing with warm soap and water daily, hang to dry. Wash humidifier portion weekly. Use bottled, distilled water and change daily   Be aware of reduced alertness and do not drive or operate heavy machinery if experiencing this or drowsiness.  Exercise encouraged, as tolerated. Encouraged proper weight management.  Important to get eight or more hours of sleep  Limiting the use of the computer and television before bedtime.  Decrease naps during the day, so night time sleep will become enhanced.  Limit caffeine, and sleep deprivation.    Avoid Allergens and Irritants Avoid secondhand smoke Avoid SICK contacts Recommend  Masking  when appropriate Recommend Keep up-to-date with vaccinations  Continue inhalers and nebulizers as prescribed Continue oxygen with CPAP as prescribed  Recommend breathing exercises with incentive spirometry 10 times a day to prevent pneumonia Continue dialysis as scheduled Referral to DME company for oxygen  Follow-up with cardiology as scheduled

## 2024-06-26 ENCOUNTER — Other Ambulatory Visit: Payer: Self-pay | Admitting: Physician Assistant

## 2024-07-04 ENCOUNTER — Other Ambulatory Visit: Payer: Self-pay | Admitting: Physician Assistant

## 2024-07-04 DIAGNOSIS — K219 Gastro-esophageal reflux disease without esophagitis: Secondary | ICD-10-CM

## 2024-07-04 NOTE — Telephone Encounter (Signed)
 Copied from CRM [id number]. Topic: Clinical - Medication Refill >> Jul 04, 2024  3:11 PM Wess S wrote: Medication: allopurinol  (ZYLOPRIM ) 100 MG tablet  apixaban  (ELIQUIS ) 2.5 MG TABS tablet  esomeprazole  (NEXIUM ) 40 MG capsule  Has the patient contacted their pharmacy? Yes (Agent: If no, request that the patient contact the pharmacy for the refill. If patient does not wish to contact the pharmacy document the reason why and proceed with request.) (Agent: If yes, when and what did the pharmacy advise?) Tried to call and did not get a response  This is the patient's preferred pharmacy:  [facility] - [place], [place] - [address] [place] [address] Phone: [phone] Fax: [phone]   Is this the correct pharmacy for this prescription? Yes If no, delete pharmacy and type the correct one.   Has the prescription been filled recently? Yes  Is the patient out of the medication? Yes  Has the patient been seen for an appointment in the last year OR does the patient have an upcoming appointment? Yes  Can we respond through MyChart? Yes  Agent: Please be advised that Rx refills may take up to 3 business days. We ask that you follow-up with your pharmacy.

## 2024-07-07 ENCOUNTER — Encounter: Payer: Self-pay | Admitting: Physician Assistant

## 2024-07-07 MED ORDER — ESOMEPRAZOLE MAGNESIUM 40 MG PO CPDR: 40.0000 mg | DELAYED_RELEASE_CAPSULE | Freq: Every day | ORAL | 2 refills | Status: DC

## 2024-07-07 MED ORDER — APIXABAN 2.5 MG PO TABS: 2.5000 mg | ORAL_TABLET | Freq: Two times a day (BID) | ORAL | 11 refills | Status: AC

## 2024-07-07 MED ORDER — ALLOPURINOL 100 MG PO TABS: 100.0000 mg | ORAL_TABLET | Freq: Every day | ORAL | 1 refills | Status: DC

## 2024-07-07 NOTE — Telephone Encounter (Signed)
 Left message for pt to f/u with cardiology regarding medication. Any questions/concerns to return out call.

## 2024-07-07 NOTE — Telephone Encounter (Signed)
 Requested medication (s) are due for refill today: routing for review  Requested medication (s) are on the active medication list: yes  Last refill:  multiple dates  Future visit scheduled: yes  Notes to clinic:  Unable to refill per protocol, last refill by another provider.      Requested Prescriptions  Pending Prescriptions Disp Refills   allopurinol  (ZYLOPRIM ) 100 MG tablet      Sig: Take 1 tablet (100 mg total) by mouth daily.     Endocrinology:  Gout Agents - allopurinol  Failed - 07/07/2024 11:35 AM      Failed - Uric Acid in normal range and within 360 days    Uric Acid, Serum  Date Value Ref Range Status  06/02/2023 12.0 (H) 2.5 - 7.1 mg/dL Final    Comment:    Performed at [facility], [address] [place], [place] [address]         Failed - Cr in normal range and within 360 days    Creatinine  Date Value Ref Range Status  06/20/2012 0.65 0.60 - 1.30 mg/dL Final   Creatinine, Ser  Date Value Ref Range Status  05/23/2024 4.06 (H) 0.44 - 1.00 mg/dL Final         Passed - Valid encounter within last 12 months    Recent Outpatient Visits           2 weeks ago Depression, unspecified depression type   [place] [facility] [place], Melvin, PA-C   2 months ago Hospital discharge follow-up   [facility] [place], [place], PA-C   3 months ago Essential hypertension   [place] [facility] [place], [place], PA-C   4 months ago Encounter to establish care   [facility] [place], [place], PA-C              Passed - CBC within normal limits and completed in the last 12 months    WBC  Date Value Ref Range Status  05/23/2024 6.3 4.0 - 10.5 K/uL Final   RBC  Date Value Ref Range Status  05/23/2024 3.53 (L) 3.87 - 5.11 MIL/uL Final   Hemoglobin  Date Value Ref Range Status  05/23/2024 10.2 (L) 12.0 - 15.0 g/dL Final  94/77/7974 9.0 (L) 11.1 - 15.9 g/dL Final   HCT   Date Value Ref Range Status  05/23/2024 31.5 (L) 36.0 - 46.0 % Final   Hematocrit  Date Value Ref Range Status  02/21/2024 28.0 (L) 34.0 - 46.6 % Final   MCHC  Date Value Ref Range Status  05/23/2024 32.4 30.0 - 36.0 g/dL Final   [facility]  Date Value Ref Range Status  05/23/2024 28.9 26.0 - 34.0 pg Final   MCV  Date Value Ref Range Status  05/23/2024 89.2 80.0 - 100.0 fL Final  02/21/2024 90 79 - 97 fL Final  06/20/2012 83 80 - 100 fL Final   No results found for: PLTCOUNTKUC, LABPLAT, POCPLA RDW  Date Value Ref Range Status  05/23/2024 15.7 (H) 11.5 - 15.5 % Final  02/21/2024 14.1 11.7 - 15.4 % Final  06/20/2012 13.7 11.5 - 14.5 % Final          apixaban  (ELIQUIS ) 2.5 MG TABS tablet 60 tablet 11    Sig: Take 1 tablet (2.5 mg total) by mouth 2 (two) times daily.     Hematology:  Anticoagulants - apixaban  Failed - 07/07/2024 11:35 AM      Failed -  HGB in normal range and within 360 days    Hemoglobin  Date Value Ref Range Status  05/23/2024 10.2 (L) 12.0 - 15.0 g/dL Final  94/77/7974 9.0 (L) 11.1 - 15.9 g/dL Final         Failed - HCT in normal range and within 360 days    HCT  Date Value Ref Range Status  05/23/2024 31.5 (L) 36.0 - 46.0 % Final   Hematocrit  Date Value Ref Range Status  02/21/2024 28.0 (L) 34.0 - 46.6 % Final         Failed - Cr in normal range and within 360 days    Creatinine  Date Value Ref Range Status  06/20/2012 0.65 0.60 - 1.30 mg/dL Final   Creatinine, Ser  Date Value Ref Range Status  05/23/2024 4.06 (H) 0.44 - 1.00 mg/dL Final         Passed - PLT in normal range and within 360 days    Platelets  Date Value Ref Range Status  05/23/2024 190 150 - 400 K/uL Final  02/21/2024 163 150 - 450 x10E3/uL Final         Passed - AST in normal range and within 360 days    AST  Date Value Ref Range Status  05/19/2024 22 15 - 41 U/L Final   SGOT(AST)  Date Value Ref Range Status  06/20/2012 32 15 - 37 Unit/L Final          Passed - ALT in normal range and within 360 days    ALT  Date Value Ref Range Status  05/19/2024 17 0 - 44 U/L Final   SGPT (ALT)  Date Value Ref Range Status  06/20/2012 44 12 - 78 U/L Final         Passed - Valid encounter within last 12 months    Recent Outpatient Visits           2 weeks ago Depression, unspecified depression type   [facility] [place], [place], PA-C   2 months ago Hospital discharge follow-up   [facility] [place], [place], PA-C   3 months ago Essential hypertension    [facility] [place], [place], PA-C   4 months ago Encounter to establish care   [facility] [place], Janna, PA-C               esomeprazole  (NEXIUM ) 40 MG capsule 30 capsule 2    Sig: Take 1 capsule (40 mg total) by mouth daily before breakfast.     Gastroenterology: Proton Pump Inhibitors 2 Passed - 07/07/2024 11:35 AM      Passed - ALT in normal range and within 360 days    ALT  Date Value Ref Range Status  05/19/2024 17 0 - 44 U/L Final   SGPT (ALT)  Date Value Ref Range Status  06/20/2012 44 12 - 78 U/L Final         Passed - AST in normal range and within 360 days    AST  Date Value Ref Range Status  05/19/2024 22 15 - 41 U/L Final   SGOT(AST)  Date Value Ref Range Status  06/20/2012 32 15 - 37 Unit/L Final         Passed - Valid encounter within last 12 months    Recent Outpatient Visits           2 weeks ago Depression, unspecified depression type   [facility] [place], [place], PA-C   2 months ago Hospital discharge follow-up   [facility] [place], [place], PA-C   3 months ago Essential hypertension    [facility] [place]   4 months ago Encounter to establish care   [facility] [place], [place], PA-C

## 2024-07-14 LAB — IRON,TIBC AND FERRITIN PANEL
Ferritin: 94
Iron: 23

## 2024-07-14 LAB — VITAMIN D 25 HYDROXY (VIT D DEFICIENCY, FRACTURES): Vit D, 25-Hydroxy: 37.4

## 2024-07-17 NOTE — Telephone Encounter (Signed)
 FYI.SABRASABRASABRASABRASABRACalled patient to inform her of provider's message, she stated that all the prescriptions had been refilled and that her cardiologist filled them.  However it looks like a CMA with our office filled them on 07/07/2024.  When I advised that the provider stated she would need an appointment, she stated she had already got them.  It does look like she has an appointment with provider on 08/19/2024.

## 2024-07-22 ENCOUNTER — Other Ambulatory Visit: Payer: Self-pay | Admitting: Physician Assistant

## 2024-07-28 ENCOUNTER — Ambulatory Visit (INDEPENDENT_AMBULATORY_CARE_PROVIDER_SITE_OTHER): Admitting: Audiology

## 2024-07-28 ENCOUNTER — Ambulatory Visit (INDEPENDENT_AMBULATORY_CARE_PROVIDER_SITE_OTHER): Admitting: Otolaryngology

## 2024-07-30 ENCOUNTER — Telehealth: Payer: Self-pay

## 2024-07-30 ENCOUNTER — Other Ambulatory Visit: Payer: Self-pay

## 2024-07-30 DIAGNOSIS — J449 Chronic obstructive pulmonary disease, unspecified: Secondary | ICD-10-CM

## 2024-07-30 NOTE — Telephone Encounter (Unsigned)
 Copied from CRM [id number]. Topic: Clinical - Prescription Issue >> Jul 29, 2024  5:22 PM Delon DASEN wrote: Reason for CRM: Landry with [facility] checking if fax was received, resending.

## 2024-07-30 NOTE — Telephone Encounter (Signed)
 Have received fax form. Will place for janna to review.

## 2024-08-11 LAB — CBC AND DIFFERENTIAL: Hemoglobin: 10.7 — AB (ref 12.0–16.0)

## 2024-08-11 LAB — LAB REPORT - SCANNED
Calcium: 8.6
PTH, Intact: 130

## 2024-08-11 LAB — COMPREHENSIVE METABOLIC PANEL WITH GFR
Albumin: 4.4 (ref 3.5–5.0)
Calcium: 8.6 — AB (ref 8.7–10.7)

## 2024-08-11 LAB — BASIC METABOLIC PANEL WITH GFR
Glucose: 121
Potassium: 4.3 meq/L (ref 3.5–5.1)

## 2024-08-19 ENCOUNTER — Encounter: Payer: Self-pay | Admitting: Physician Assistant

## 2024-08-19 ENCOUNTER — Ambulatory Visit: Admitting: Physician Assistant

## 2024-08-19 VITALS — BP 138/63 | HR 61 | Ht 62.0 in | Wt 171.0 lb

## 2024-08-19 DIAGNOSIS — G4733 Obstructive sleep apnea (adult) (pediatric): Secondary | ICD-10-CM

## 2024-08-19 DIAGNOSIS — J9610 Chronic respiratory failure, unspecified whether with hypoxia or hypercapnia: Secondary | ICD-10-CM

## 2024-08-19 DIAGNOSIS — E1159 Type 2 diabetes mellitus with other circulatory complications: Secondary | ICD-10-CM

## 2024-08-19 DIAGNOSIS — I152 Hypertension secondary to endocrine disorders: Secondary | ICD-10-CM

## 2024-08-19 DIAGNOSIS — F419 Anxiety disorder, unspecified: Secondary | ICD-10-CM

## 2024-08-19 DIAGNOSIS — E039 Hypothyroidism, unspecified: Secondary | ICD-10-CM

## 2024-08-19 DIAGNOSIS — Z992 Dependence on renal dialysis: Secondary | ICD-10-CM

## 2024-08-19 DIAGNOSIS — E1169 Type 2 diabetes mellitus with other specified complication: Secondary | ICD-10-CM

## 2024-08-19 DIAGNOSIS — F32A Depression, unspecified: Secondary | ICD-10-CM

## 2024-08-19 DIAGNOSIS — L989 Disorder of the skin and subcutaneous tissue, unspecified: Secondary | ICD-10-CM | POA: Diagnosis not present

## 2024-08-19 DIAGNOSIS — N186 End stage renal disease: Secondary | ICD-10-CM

## 2024-08-19 DIAGNOSIS — J449 Chronic obstructive pulmonary disease, unspecified: Secondary | ICD-10-CM

## 2024-08-19 DIAGNOSIS — E669 Obesity, unspecified: Secondary | ICD-10-CM

## 2024-08-19 MED ORDER — HYDRALAZINE HCL 25 MG PO TABS: 25.0000 mg | ORAL_TABLET | Freq: Two times a day (BID) | ORAL | 0 refills | Status: DC

## 2024-08-19 MED ORDER — METOPROLOL TARTRATE 25 MG PO TABS: 12.5000 mg | ORAL_TABLET | Freq: Two times a day (BID) | ORAL | 0 refills | Status: AC

## 2024-08-19 NOTE — Progress Notes (Signed)
 Established patient visit  Patient: [name]   DOB: 05-03-1941   [name]  MRN: [id number] Visit Date: 08/19/2024  Today's healthcare provider: Jolynn Spencer, PA-C   Chief Complaint  Patient presents with   Medical Management of Chronic Issues    2 month f/u    Subjective     Discussed the use of AI scribe software for clinical note transcription with the patient, who gave verbal consent to proceed.  History of Present Illness [name] is an 83 year old female with hypertension and chronic kidney disease who presents for medication management and follow-up.  Her blood pressure has previously reached 190/90 mmHg but improved after resuming a medication stopped during a hospital stay. She currently takes metoprolol  and hydralazine . Blood pressure fluctuates during dialysis due to fluid removal. Dialysis occurs three times a week, with occasional additional sessions needed for insufficient fluid removal, leading to cramping. Her nephrologist monitors her condition biweekly during dialysis.  She uses a CPAP machine and oxygen at night for her lung condition, along with Breo and a nebulizer. Incentive spirometry is practiced to improve lung function.  A skin infection on her hand, related to a piece of steel and possibly a cat scratch, is managed with peroxide cleaning and antibiotic ointment.  She independently manages her medications, including levothyroxine , a medication for depression and anxiety, and medications for diabetes and cholesterol. Her A1c levels are stable. She experiences sensitive skin and easy bruising, attributed to frequent blood draws. No vision problems, recent fever, or chills. She feels sleepy and fatigued prior to exercise.       08/19/2024    2:38 PM 05/27/2024   12:42 PM 05/08/2024   10:12 AM  Depression screen PHQ 2/9  Decreased Interest 0 0 0  Down, Depressed, Hopeless 0 0 0  PHQ - 2 Score 0 0 0  Altered sleeping 0 0 1  Tired, decreased  energy 0 0 1  Change in appetite 0 0 0  Feeling bad or failure about yourself  0 0 0  Trouble concentrating 0 0 0  Moving slowly or fidgety/restless 0 0 0  Suicidal thoughts 0 0 0  PHQ-9 Score 0 0  2   Difficult doing work/chores Not difficult at all Not difficult at all Not difficult at all     Data saved with a previous flowsheet row definition      08/19/2024    2:39 PM 05/08/2024   10:12 AM 03/27/2024    3:18 PM 02/15/2024   10:34 AM  GAD 7 : Generalized Anxiety Score  Nervous, Anxious, on Edge 0 0 0 0  Control/stop worrying 0 0 0 0  Worry too much - different things 0 0 0   Trouble relaxing 0 0 0 2  Restless 0 0 0 3  Easily annoyed or irritable 0 1 0 0  Afraid - awful might happen 0 0 0 0  Total GAD 7 Score 0 1 0   Anxiety Difficulty Not difficult at all Not difficult at all Not difficult at all Not difficult at all    Medications: Outpatient Medications Prior to Visit  Medication Sig   albuterol  (VENTOLIN  HFA) 108 (90 Base) MCG/ACT inhaler Inhale 2 puffs into the lungs every 6 (six) hours as needed for wheezing or shortness of breath.   allopurinol  (ZYLOPRIM ) 100 MG tablet Take 1 tablet (100 mg total) by mouth daily.   amLODipine  (NORVASC ) 5 MG tablet Take 5 mg by mouth daily.   apixaban  (  ELIQUIS ) 2.5 MG TABS tablet Take 1 tablet (2.5 mg total) by mouth 2 (two) times daily.   azelastine  (ASTELIN ) 0.1 % nasal spray Place 2 sprays into both nostrils 2 (two) times daily. Use in each nostril as directed   calcium  carbonate (CALCIUM  600) 600 MG TABS tablet Take 600 mg by mouth daily.   epoetin  alfa-epbx (RETACRIT ) 4000 UNIT/ML injection Inject 1 mL (4,000 Units total) into the vein every Monday, Wednesday, and Friday at 6 PM.   esomeprazole  (NEXIUM ) 40 MG capsule Take 1 capsule (40 mg total) by mouth daily before breakfast.   ferrous sulfate  325 (65 FE) MG tablet Take 325 mg by mouth daily with breakfast.   fluticasone  (FLONASE ) 50 MCG/ACT nasal spray Place 2 sprays into both  nostrils daily.   fluticasone  furoate-vilanterol (BREO ELLIPTA ) 200-25 MCG/ACT AEPB Inhale 1 puff into the lungs daily.   folic acid  (FOLVITE ) 1 MG tablet Take 1 tablet (1 mg total) by mouth daily.   hydrALAZINE  (APRESOLINE ) 25 MG tablet Take 25 mg by mouth in the morning and at bedtime.   ipratropium-albuterol  (DUONEB) 0.5-2.5 (3) MG/3ML SOLN INHALE THE CONTENTS OF ONE (1) VIAL VIA NEBULIZATION EVERY 6 HOURS AS NEEDED FORSHORTNESS OF BREATH   levothyroxine  (SYNTHROID ) 150 MCG tablet TAKE 1 TABLET BY MOUTH ONCE DAILY. TAKE ON AN EMPTY STOMACH WITH A GLASS OF WATER AT LEAST 30-60 MINUTES BEFORE BREAKFAST   lidocaine -prilocaine  (EMLA ) cream as directed. Before dialysis   metoprolol  tartrate (LOPRESSOR ) 25 MG tablet Take 0.5 tablets (12.5 mg total) by mouth 2 (two) times daily.   montelukast  (SINGULAIR ) 10 MG tablet TAKE 1 TABLET BY MOUTH NIGHTLY   nystatin powder Apply 1 Application topically 3 (three) times daily.   rosuvastatin  (CRESTOR ) 40 MG tablet TAKE ONE TABLET BY MOUTH EVERY EVENING   sertraline  (ZOLOFT ) 100 MG tablet TAKE 1 TABLET BY MOUTH DAILY   torsemide  (DEMADEX ) 20 MG tablet TAKE TWO TABLETS BY MOUTH ONCE DAILY   [DISCONTINUED] loperamide  (IMODIUM ) 2 MG capsule Take 1 capsule (2 mg total) by mouth as needed for diarrhea or loose stools.   [DISCONTINUED] Multiple Minerals-Vitamins (CALCIUM  & VIT D3 BONE HEALTH PO) Take 1 tablet by mouth daily. 600 mg/ 25 mg   [DISCONTINUED] senna-docusate (SENOKOT-S) 8.6-50 MG tablet Take 2 tablets by mouth at bedtime as needed for mild constipation.   [DISCONTINUED] vitamin B-12 (CYANOCOBALAMIN ) 500 MCG tablet Take 500 mcg by mouth daily.   acetaminophen  (TYLENOL ) 500 MG tablet Take 1,000 mg by mouth every 6 (six) hours as needed for mild pain or moderate pain. (Patient not taking: Reported on 08/19/2024)   No facility-administered medications prior to visit.    Review of Systems All negative Except see HPI       Objective    BP 138/63    Pulse 61   Ht 5' 2 (1.575 m)   Wt 171 lb (77.6 kg)   SpO2 97%   BMI 31.28 kg/m     Physical Exam Vitals reviewed.  Constitutional:      General: She is not in acute distress.    Appearance: Normal appearance. She is well-developed. She is not diaphoretic.  HENT:     Head: Normocephalic and atraumatic.  Eyes:     General: No scleral icterus.    Conjunctiva/sclera: Conjunctivae normal.  Neck:     Thyroid : No thyromegaly.  Cardiovascular:     Rate and Rhythm: Normal rate and regular rhythm.     Pulses: Normal pulses.     Heart sounds:  Normal heart sounds. No murmur heard. Pulmonary:     Effort: Pulmonary effort is normal. No respiratory distress.     Breath sounds: Normal breath sounds. No wheezing, rhonchi or rales.  Musculoskeletal:     Cervical back: Neck supple.     Right lower leg: No edema.     Left lower leg: No edema.  Lymphadenopathy:     Cervical: No cervical adenopathy.  Skin:    General: Skin is warm and dry.     Findings: No rash.  Neurological:     Mental Status: She is alert and oriented to person, place, and time. Mental status is at baseline.  Psychiatric:        Mood and Affect: Mood normal.        Behavior: Behavior normal.      No results found for any visits on 08/19/24.      Assessment & Plan End-stage renal disease on hemodialysis Undergoing hemodialysis three times a week with cramping and blood pressure fluctuations due to fluid removal. Last GFR 10  fromm 05/23/24 - Continue hemodialysis three times a week. - Adjust fluid removal as needed to prevent cramping.  Hypertension Chronic and stable Blood pressure management complicated by dialysis. Controlled with hydralazine . Cardiologist consultation needed for further management. - Continue hydralazine  25 for 90 days, metoprolol  12.5 BID, torsemide  20 - Monitor blood pressure daily. - Contact cardiologist for further management. - Schedule appointment if blood pressure spikes. Continue  low salt diet and regular exercise Needs to bring medications to the next appt Will follow-up  Chronic respiratory failure with hypoxia/COPD Mild obstructive airways disease Uses oxygen, Breo ellipta , and nebulizer for respiratory support. Incentive spirometry recommended.  - Continue CPAP and oxygen therapy at night. - Use Breo and nebulizer as prescribed. - Perform incentive spirometry regularly for restrictive lung physiology with severe kyphosis. Advised an avoidance of secondhand smoke, sick contacts, allergens and irritant Pt was advised to use mask when appropriate and keep uptodate with vaccinations. 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 Prefers to avoid antibiotics unless necessary. - Clean wound with saline. - Apply topical antibiotic ointment. - Monitor for signs of infection. - Consider dermatology referral if wound does not heal in 5 days.  Type 2 diabetes mellitus Chronic and stable Diabetes management well-controlled with A1c levels within target range. - Repeat A1c testing as needed. Advised low carb diet and daily exercise Will follow-up  Depression and anxiety Chronic and stable - Continue current medication/sertraline  100 for depression and anxiety. Will follow-up  Hypothyroidism Chronic and stable Managed with levothyroxine  150. - Continue levothyroxine  as prescribed. Will follow-up with TSH at the follow-up  Hyperlipidemia Chronic and stable Continue low cholesterol diet and daily exercise Continue current med regimen/rosuvastatin  40 Will follow-up  Hypertension associated with diabetes (HCC) (Primary)  - metoprolol  tartrate (LOPRESSOR ) 25 MG tablet; Take 0.5 tablets (12.5 mg total) by mouth 2 (two) times daily.  Dispense: 180 tablet; Refill: 0 - hydrALAZINE   (APRESOLINE ) 25 MG tablet; Take 1 tablet (25 mg total) by mouth in the morning and at bedtime.  Dispense: 90 tablet; Refill: 0  No orders of the defined types were placed in this encounter.   No follow-ups on file.   The patient was advised to call back or seek an in-person evaluation if the symptoms worsen or if  the condition fails to improve as anticipated.  I discussed the assessment and treatment plan with the patient. The patient was provided an opportunity to ask questions and all were answered. The patient agreed with the plan and demonstrated an understanding of the instructions.  I, Brucha Ahlquist, PA-C have reviewed all documentation for this visit. The documentation on 08/19/2024  for the exam, diagnosis, procedures, and orders are all accurate and complete.  Jolynn Spencer, [facility], MMS [facility] [phone] (phone) [phone] (fax)  [facility]

## 2024-08-20 ENCOUNTER — Other Ambulatory Visit: Payer: Self-pay | Admitting: Physician Assistant

## 2024-08-20 DIAGNOSIS — F32A Depression, unspecified: Secondary | ICD-10-CM

## 2024-08-22 NOTE — Progress Notes (Incomplete)
 Established patient visit  Patient: [name]   DOB: [birth year]   [name]  MRN: [id number] Visit Date: 08/19/2024  Today's healthcare provider: Jolynn Spencer, PA-C   Chief Complaint  Patient presents with  . Medical Management of Chronic Issues    2 month f/u    Subjective     Discussed the use of AI scribe software for clinical note transcription with the patient, who gave verbal consent to proceed.  History of Present Illness [name] is an 83 year old female with hypertension and chronic kidney disease who presents for medication management and follow-up.  Her blood pressure has previously reached 190/90 mmHg but improved after resuming a medication stopped during a hospital stay. She currently takes metoprolol  and hydralazine . Blood pressure fluctuates during dialysis due to fluid removal. Dialysis occurs three times a week, with occasional additional sessions needed for insufficient fluid removal, leading to cramping. Her nephrologist monitors her condition biweekly during dialysis.  She uses a CPAP machine and oxygen at night for her lung condition, along with Breo and a nebulizer. Incentive spirometry is practiced to improve lung function.  A skin infection on her hand, related to a piece of steel and possibly a cat scratch, is managed with peroxide cleaning and antibiotic ointment.  She independently manages her medications, including levothyroxine , a medication for depression and anxiety, and medications for diabetes and cholesterol. Her A1c levels are stable. She experiences sensitive skin and easy bruising, attributed to frequent blood draws. No vision problems, recent fever, or chills. She feels sleepy and fatigued prior to exercise.       08/19/2024    2:38 PM 05/27/2024   12:42 PM 05/08/2024   10:12 AM  Depression screen PHQ 2/9  Decreased Interest 0 0 0  Down, Depressed, Hopeless 0 0 0  PHQ - 2 Score 0 0 0  Altered sleeping 0 0 1  Tired, decreased  energy 0 0 1  Change in appetite 0 0 0  Feeling bad or failure about yourself  0 0 0  Trouble concentrating 0 0 0  Moving slowly or fidgety/restless 0 0 0  Suicidal thoughts 0 0 0  PHQ-9 Score 0 0  2   Difficult doing work/chores Not difficult at all Not difficult at all Not difficult at all     Data saved with a previous flowsheet row definition      08/19/2024    2:39 PM 05/08/2024   10:12 AM 03/27/2024    3:18 PM 02/15/2024   10:34 AM  GAD 7 : Generalized Anxiety Score  Nervous, Anxious, on Edge 0 0 0 0  Control/stop worrying 0 0 0 0  Worry too much - different things 0 0 0   Trouble relaxing 0 0 0 2  Restless 0 0 0 3  Easily annoyed or irritable 0 1 0 0  Afraid - awful might happen 0 0 0 0  Total GAD 7 Score 0 1 0   Anxiety Difficulty Not difficult at all Not difficult at all Not difficult at all Not difficult at all    Medications: Outpatient Medications Prior to Visit  Medication Sig  . albuterol  (VENTOLIN  HFA) 108 (90 Base) MCG/ACT inhaler Inhale 2 puffs into the lungs every 6 (six) hours as needed for wheezing or shortness of breath.  . allopurinol  (ZYLOPRIM ) 100 MG tablet Take 1 tablet (100 mg total) by mouth daily.  . amLODipine  (NORVASC ) 5 MG tablet Take 5 mg by mouth daily.  . apixaban  (  ELIQUIS ) 2.5 MG TABS tablet Take 1 tablet (2.5 mg total) by mouth 2 (two) times daily.  . azelastine  (ASTELIN ) 0.1 % nasal spray Place 2 sprays into both nostrils 2 (two) times daily. Use in each nostril as directed  . calcium  carbonate (CALCIUM  600) 600 MG TABS tablet Take 600 mg by mouth daily.  . epoetin  alfa-epbx (RETACRIT ) 4000 UNIT/ML injection Inject 1 mL (4,000 Units total) into the vein every Monday, Wednesday, and Friday at 6 PM.  . esomeprazole  (NEXIUM ) 40 MG capsule Take 1 capsule (40 mg total) by mouth daily before breakfast.  . ferrous sulfate  325 (65 FE) MG tablet Take 325 mg by mouth daily with breakfast.  . fluticasone  (FLONASE ) 50 MCG/ACT nasal spray Place 2 sprays  into both nostrils daily.  . fluticasone  furoate-vilanterol (BREO ELLIPTA ) 200-25 MCG/ACT AEPB Inhale 1 puff into the lungs daily.  . folic acid  (FOLVITE ) 1 MG tablet Take 1 tablet (1 mg total) by mouth daily.  . hydrALAZINE  (APRESOLINE ) 25 MG tablet Take 25 mg by mouth in the morning and at bedtime.  SABRA ipratropium-albuterol  (DUONEB) 0.5-2.5 (3) MG/3ML SOLN INHALE THE CONTENTS OF ONE (1) VIAL VIA NEBULIZATION EVERY 6 HOURS AS NEEDED FORSHORTNESS OF BREATH  . levothyroxine  (SYNTHROID ) 150 MCG tablet TAKE 1 TABLET BY MOUTH ONCE DAILY. TAKE ON AN EMPTY STOMACH WITH A GLASS OF WATER AT LEAST 30-60 MINUTES BEFORE BREAKFAST  . lidocaine -prilocaine  (EMLA ) cream as directed. Before dialysis  . metoprolol  tartrate (LOPRESSOR ) 25 MG tablet Take 0.5 tablets (12.5 mg total) by mouth 2 (two) times daily.  . montelukast  (SINGULAIR ) 10 MG tablet TAKE 1 TABLET BY MOUTH NIGHTLY  . nystatin powder Apply 1 Application topically 3 (three) times daily.  . rosuvastatin  (CRESTOR ) 40 MG tablet TAKE ONE TABLET BY MOUTH EVERY EVENING  . sertraline  (ZOLOFT ) 100 MG tablet TAKE 1 TABLET BY MOUTH DAILY  . torsemide  (DEMADEX ) 20 MG tablet TAKE TWO TABLETS BY MOUTH ONCE DAILY  . [DISCONTINUED] loperamide  (IMODIUM ) 2 MG capsule Take 1 capsule (2 mg total) by mouth as needed for diarrhea or loose stools.  . [DISCONTINUED] Multiple Minerals-Vitamins (CALCIUM  & VIT D3 BONE HEALTH PO) Take 1 tablet by mouth daily. 600 mg/ 25 mg  . [DISCONTINUED] senna-docusate (SENOKOT-S) 8.6-50 MG tablet Take 2 tablets by mouth at bedtime as needed for mild constipation.  . [DISCONTINUED] vitamin B-12 (CYANOCOBALAMIN ) 500 MCG tablet Take 500 mcg by mouth daily.  . acetaminophen  (TYLENOL ) 500 MG tablet Take 1,000 mg by mouth every 6 (six) hours as needed for mild pain or moderate pain. (Patient not taking: Reported on 08/19/2024)   No facility-administered medications prior to visit.    Review of Systems All negative Except see HPI   {Insert  previous labs (optional):23779} {See past labs  Heme  Chem  Endocrine  Serology  Results Review (optional):1}   Objective    BP 138/63   Pulse 61   Ht 5' 2 (1.575 m)   Wt 171 lb (77.6 kg)   SpO2 97%   BMI 31.28 kg/m  {Insert last BP/Wt (optional):23777}{See vitals history (optional):1}   Physical Exam Vitals reviewed.  Constitutional:      General: She is not in acute distress.    Appearance: Normal appearance. She is well-developed. She is not diaphoretic.  HENT:     Head: Normocephalic and atraumatic.  Eyes:     General: No scleral icterus.    Conjunctiva/sclera: Conjunctivae normal.  Neck:     Thyroid : No thyromegaly.  Cardiovascular:  Rate and Rhythm: Normal rate and regular rhythm.     Pulses: Normal pulses.     Heart sounds: Normal heart sounds. No murmur heard. Pulmonary:     Effort: Pulmonary effort is normal. No respiratory distress.     Breath sounds: Normal breath sounds. No wheezing, rhonchi or rales.  Musculoskeletal:     Cervical back: Neck supple.     Right lower leg: No edema.     Left lower leg: No edema.  Lymphadenopathy:     Cervical: No cervical adenopathy.  Skin:    General: Skin is warm and dry.     Findings: No rash.  Neurological:     Mental Status: She is alert and oriented to person, place, and time. Mental status is at baseline.  Psychiatric:        Mood and Affect: Mood normal.        Behavior: Behavior normal.      No results found for any visits on 08/19/24.      Assessment & Plan End-stage renal disease on hemodialysis Undergoing hemodialysis three times a week with cramping and blood pressure fluctuations due to fluid removal. Last GFR 10  fromm 05/23/24 - Continue hemodialysis three times a week. - Adjust fluid removal as needed to prevent cramping.  Hypertension Chronic and stable Blood pressure management complicated by dialysis. Controlled with hydralazine . Cardiologist consultation needed for further  management. - Continue hydralazine  25 for 90 days, metoprolol  12.5 BID, torsemide  20 - Monitor blood pressure daily. - Contact cardiologist for further management. - Schedule appointment if blood pressure spikes.  Will follow-up  Continue with metoprolol  25mg  daily. Will reassure rx refill.  Torsemide  20mg  continue for CHF Toprol  for rate control and Eliquis  2.5 bid for anticoagulation - for Afib Consider clinic pharmacist for polypharmacy and concern about compliance Will follow-up Chronic respiratory failure with hypoxia Uses CPAP, oxygen, Breo ellipta , and nebulizer for respiratory support. Incentive spirometry recommended. Continue duoneb, albuterol  inhalers - Continue CPAP and oxygen therapy at night. - Use Breo and nebulizer as prescribed. - Perform incentive spirometry regularly. Managed by pulmonology Avoid Allergens and Irritants Avoid secondhand smoke Avoid SICK contacts Recommend  Masking  when appropriate Recommend Keep up-to-date with vaccinations Incentive spirometry 10 times per day Chronic skin wound with possible infection Chronic wound with signs of infection, possibly from a cat scratch. Prefers to avoid antibiotics unless necessary. - Clean wound with saline. - Apply topical antibiotic ointment. - Monitor for signs of infection. - Consider dermatology referral if wound does not heal in 5 days.  Type 2 diabetes mellitus Chronic and stable Diabetes management well-controlled with A1c levels within target range. - Repeat A1c testing as needed. Advised low carb diet and daily exercise Will follow-up  Depression and anxiety Chronic and stable - Continue current medication/sertraline  100 for depression and anxiety. Will follow-up  Hypothyroidism Chronic and stable Managed with levothyroxine  150. - Continue levothyroxine  as prescribed. Will follow-up  Hyperlipidemia Chronic and stable Continue low cholesterol diet and daily exercise Continue current  med regimen/rosuvastatin  40 Will follow-up    No orders of the defined types were placed in this encounter.   No follow-ups on file.   The patient was advised to call back or seek an in-person evaluation if the symptoms worsen or if the condition fails to improve as anticipated.  I discussed the assessment and treatment plan with the patient. The patient was provided an opportunity to ask questions and all were answered. The patient agreed with the  plan and demonstrated an understanding of the instructions.  I, Jayleene Glaeser, PA-C have reviewed all documentation for this visit. The documentation on 08/19/2024  for the exam, diagnosis, procedures, and orders are all accurate and complete.  Jolynn Spencer, [facility], MMS [facility] [phone] (phone) [phone] (fax)  [facility]

## 2024-08-23 ENCOUNTER — Other Ambulatory Visit: Payer: Self-pay

## 2024-08-23 ENCOUNTER — Emergency Department

## 2024-08-23 ENCOUNTER — Encounter: Payer: Self-pay | Admitting: Internal Medicine

## 2024-08-23 ENCOUNTER — Observation Stay
Admission: EM | Admit: 2024-08-23 | Discharge: 2024-08-27 | Disposition: A | Attending: Emergency Medicine | Admitting: Emergency Medicine

## 2024-08-23 DIAGNOSIS — G4733 Obstructive sleep apnea (adult) (pediatric): Secondary | ICD-10-CM | POA: Diagnosis present

## 2024-08-23 DIAGNOSIS — E039 Hypothyroidism, unspecified: Secondary | ICD-10-CM | POA: Diagnosis not present

## 2024-08-23 DIAGNOSIS — E785 Hyperlipidemia, unspecified: Secondary | ICD-10-CM | POA: Diagnosis not present

## 2024-08-23 DIAGNOSIS — A0472 Enterocolitis due to Clostridium difficile, not specified as recurrent: Secondary | ICD-10-CM | POA: Insufficient documentation

## 2024-08-23 DIAGNOSIS — I509 Heart failure, unspecified: Secondary | ICD-10-CM | POA: Insufficient documentation

## 2024-08-23 DIAGNOSIS — Z79899 Other long term (current) drug therapy: Secondary | ICD-10-CM | POA: Diagnosis not present

## 2024-08-23 DIAGNOSIS — K859 Acute pancreatitis without necrosis or infection, unspecified: Secondary | ICD-10-CM

## 2024-08-23 DIAGNOSIS — K5730 Diverticulosis of large intestine without perforation or abscess without bleeding: Secondary | ICD-10-CM | POA: Insufficient documentation

## 2024-08-23 DIAGNOSIS — Z7901 Long term (current) use of anticoagulants: Secondary | ICD-10-CM | POA: Diagnosis not present

## 2024-08-23 DIAGNOSIS — A0811 Acute gastroenteropathy due to Norwalk agent: Secondary | ICD-10-CM | POA: Diagnosis not present

## 2024-08-23 DIAGNOSIS — I132 Hypertensive heart and chronic kidney disease with heart failure and with stage 5 chronic kidney disease, or end stage renal disease: Secondary | ICD-10-CM | POA: Insufficient documentation

## 2024-08-23 DIAGNOSIS — R112 Nausea with vomiting, unspecified: Secondary | ICD-10-CM | POA: Diagnosis present

## 2024-08-23 DIAGNOSIS — I480 Paroxysmal atrial fibrillation: Secondary | ICD-10-CM | POA: Insufficient documentation

## 2024-08-23 DIAGNOSIS — Z992 Dependence on renal dialysis: Secondary | ICD-10-CM | POA: Diagnosis not present

## 2024-08-23 DIAGNOSIS — J449 Chronic obstructive pulmonary disease, unspecified: Secondary | ICD-10-CM | POA: Diagnosis not present

## 2024-08-23 DIAGNOSIS — E1169 Type 2 diabetes mellitus with other specified complication: Secondary | ICD-10-CM | POA: Diagnosis not present

## 2024-08-23 DIAGNOSIS — K219 Gastro-esophageal reflux disease without esophagitis: Secondary | ICD-10-CM | POA: Diagnosis not present

## 2024-08-23 DIAGNOSIS — M109 Gout, unspecified: Secondary | ICD-10-CM | POA: Diagnosis not present

## 2024-08-23 DIAGNOSIS — K529 Noninfective gastroenteritis and colitis, unspecified: Principal | ICD-10-CM | POA: Insufficient documentation

## 2024-08-23 DIAGNOSIS — E66811 Obesity, class 1: Secondary | ICD-10-CM | POA: Insufficient documentation

## 2024-08-23 DIAGNOSIS — F32A Depression, unspecified: Secondary | ICD-10-CM | POA: Diagnosis not present

## 2024-08-23 DIAGNOSIS — E669 Obesity, unspecified: Secondary | ICD-10-CM

## 2024-08-23 DIAGNOSIS — M810 Age-related osteoporosis without current pathological fracture: Secondary | ICD-10-CM | POA: Diagnosis not present

## 2024-08-23 DIAGNOSIS — Z6831 Body mass index (BMI) 31.0-31.9, adult: Secondary | ICD-10-CM | POA: Diagnosis not present

## 2024-08-23 DIAGNOSIS — N186 End stage renal disease: Secondary | ICD-10-CM | POA: Diagnosis not present

## 2024-08-23 LAB — CBC
HCT: 39.4 % (ref 36.0–46.0)
Hemoglobin: 12.4 g/dL (ref 12.0–15.0)
MCH: 29 pg (ref 26.0–34.0)
MCHC: 31.5 g/dL (ref 30.0–36.0)
MCV: 92.1 fL (ref 80.0–100.0)
Platelets: 192 K/uL (ref 150–400)
RBC: 4.28 MIL/uL (ref 3.87–5.11)
RDW: 16.9 % — ABNORMAL HIGH (ref 11.5–15.5)
WBC: 10.4 K/uL (ref 4.0–10.5)
nRBC: 0 % (ref 0.0–0.2)

## 2024-08-23 LAB — GASTROINTESTINAL PANEL BY PCR, STOOL (REPLACES STOOL CULTURE)

## 2024-08-23 LAB — URINALYSIS, ROUTINE W REFLEX MICROSCOPIC
Bilirubin Urine: NEGATIVE
Glucose, UA: NEGATIVE mg/dL
Hgb urine dipstick: NEGATIVE
Ketones, ur: NEGATIVE mg/dL
Nitrite: NEGATIVE
Protein, ur: 100 mg/dL — AB
Specific Gravity, Urine: 1.013 (ref 1.005–1.030)
WBC, UA: >50 WBC/hpf (ref 0–5)
pH: 7 (ref 5.0–8.0)

## 2024-08-23 LAB — COMPREHENSIVE METABOLIC PANEL WITH GFR
ALT: 21 U/L (ref 0–44)
AST: 33 U/L (ref 15–41)
Albumin: 4.6 g/dL (ref 3.5–5.0)
Alkaline Phosphatase: 118 U/L (ref 38–126)
Anion gap: 15 (ref 5–15)
BUN: 32 mg/dL — ABNORMAL HIGH (ref 8–23)
CO2: 25 mmol/L (ref 22–32)
Calcium: 10 mg/dL (ref 8.9–10.3)
Chloride: 98 mmol/L (ref 98–111)
Creatinine, Ser: 3.01 mg/dL — ABNORMAL HIGH (ref 0.44–1.00)
GFR, Estimated: 15 mL/min — ABNORMAL LOW (ref >60)
Glucose, Bld: 139 mg/dL — ABNORMAL HIGH (ref 70–99)
Potassium: 4.3 mmol/L (ref 3.5–5.1)
Sodium: 139 mmol/L (ref 135–145)
Total Bilirubin: 0.7 mg/dL (ref 0.0–1.2)
Total Protein: 7.7 g/dL (ref 6.5–8.1)

## 2024-08-23 LAB — C DIFFICILE QUICK SCREEN W PCR REFLEX
C Diff antigen: POSITIVE — AB
C Diff toxin: NEGATIVE

## 2024-08-23 LAB — CLOSTRIDIUM DIFFICILE BY PCR, REFLEXED
Hypervirulent Strain: NEGATIVE
Toxigenic C. Difficile by PCR: POSITIVE — AB

## 2024-08-23 LAB — MAGNESIUM: Magnesium: 1.6 mg/dL — ABNORMAL LOW (ref 1.7–2.4)

## 2024-08-23 LAB — LIPASE, BLOOD: Lipase: 421 U/L — ABNORMAL HIGH (ref 11–51)

## 2024-08-23 MED ORDER — HEPARIN SODIUM (PORCINE) 5000 UNIT/ML IJ SOLN: 5000.0000 [IU] | Freq: Three times a day (TID) | INTRAMUSCULAR | Status: DC

## 2024-08-23 MED ORDER — ACETAMINOPHEN 325 MG PO TABS: 650.0000 mg | ORAL_TABLET | Freq: Four times a day (QID) | ORAL | Status: DC | PRN

## 2024-08-23 MED ORDER — ONDANSETRON HCL 4 MG/2ML IJ SOLN
4.0000 mg | Freq: Once | INTRAMUSCULAR | Status: AC
  Administered 2024-08-23: 4 mg via INTRAVENOUS
  Filled 2024-08-23: qty 2

## 2024-08-23 MED ORDER — SODIUM CHLORIDE 0.9 % IV BOLUS
500.0000 mL | Freq: Once | INTRAVENOUS | Status: AC
  Administered 2024-08-23: 500 mL via INTRAVENOUS

## 2024-08-23 MED ORDER — SODIUM CHLORIDE 0.9% FLUSH
3.0000 mL | Freq: Two times a day (BID) | INTRAVENOUS | Status: DC
  Administered 2024-08-23 – 2024-08-27 (×8): 3 mL via INTRAVENOUS

## 2024-08-23 MED ORDER — ONDANSETRON HCL 4 MG/2ML IJ SOLN: 4.0000 mg | Freq: Four times a day (QID) | INTRAMUSCULAR | Status: DC | PRN

## 2024-08-23 MED ORDER — SENNOSIDES-DOCUSATE SODIUM 8.6-50 MG PO TABS: 1.0000 | ORAL_TABLET | Freq: Every evening | ORAL | Status: DC | PRN

## 2024-08-23 MED ORDER — ACETAMINOPHEN 650 MG RE SUPP: 650.0000 mg | Freq: Four times a day (QID) | RECTAL | Status: DC | PRN

## 2024-08-23 MED ORDER — APIXABAN 2.5 MG PO TABS
2.5000 mg | ORAL_TABLET | Freq: Two times a day (BID) | ORAL | Status: DC
  Administered 2024-08-23 – 2024-08-27 (×8): 2.5 mg via ORAL
  Filled 2024-08-23 (×8): qty 1

## 2024-08-23 MED ORDER — IPRATROPIUM-ALBUTEROL 0.5-2.5 (3) MG/3ML IN SOLN
3.0000 mL | Freq: Every day | RESPIRATORY_TRACT | Status: DC
  Administered 2024-08-23: 3 mL via RESPIRATORY_TRACT
  Filled 2024-08-23: qty 3

## 2024-08-23 NOTE — ED Provider Notes (Signed)
 [facility] Provider Note    Event Date/Time   First MD Initiated Contact with Patient 08/23/24 1339     (approximate)  History   Chief Complaint: Emesis  HPI  [name] is a 83 y.o. female with a past medical history of ESRD on HD Monday/Wednesday/Friday (including yesterday), diabetes, hypertension, hyperlipidemia, presents to the emergency department for nausea vomiting diarrhea.  According to the patient since yesterday she has been experiencing frequent episodes of nausea vomiting and occasional episodes of diarrhea.  Patient states some abdominal cramping but denies any pain.  Patient states she has not been able to tolerate any fluids and feels dehydrated.  Physical Exam   Triage Vital Signs: ED Triage Vitals  Encounter Vitals Group     BP 08/23/24 1221 (!) 150/91     [name] --      [name] --      [name] --      [name] --      Pulse Rate 08/23/24 1221 77     Resp 08/23/24 1221 20     Temp 08/23/24 1221 98.8 F (37.1 C)     Temp Source 08/23/24 1221 Oral     SpO2 08/23/24 1221 98 %     Weight 08/23/24 1225 170 lb 13.7 oz (77.5 kg)     Height --      Head Circumference --      Peak Flow --      Pain Score 08/23/24 1225 0     Pain Loc --      Pain Education --      Exclude from Growth Chart --     Most recent vital signs: Vitals:   08/23/24 1221  BP: (!) 150/91  Pulse: 77  Resp: 20  Temp: 98.8 F (37.1 C)  SpO2: 98%    General: Awake, no distress.  CV:  Good peripheral perfusion.  Regular rate and rhythm  Resp:  Normal effort.  Equal breath sounds bilaterally.  Abd:  No distention.  Soft, nontender.  Largely benign abdomen.  ED Results / Procedures / Treatments   EKG  EKG viewed and interpreted by myself shows a normal sinus rhythm at 80 bpm with a narrow QRS, left axis deviation, largely normal intervals with no concerning ST  changes.  RADIOLOGY  I have reviewed interpreted CT images.  No obvious obstruction seen on my evaluation. Radiology has read the CT scan as fluid-filled colon likely diarrheal illness.   MEDICATIONS ORDERED IN ED: Medications  ondansetron  (ZOFRAN ) injection 4 mg (has no administration in time range)  sodium chloride  0.9 % bolus 500 mL (has no administration in time range)     IMPRESSION / MDM / ASSESSMENT AND PLAN / ED COURSE  I reviewed the triage vital signs and the nursing notes.  Patient's presentation is most consistent with acute presentation with potential threat to life or bodily function.  Patient presents emergency department for abdominal discomfort nausea and vomiting and diarrhea since yesterday.  Differential would include gastroenteritis, bowel obstruction or partial obstruction, intra-abdominal infection or pathology.  Patient does make a small amount of urine each day she is on dialysis she got a full session yesterday.  Patient's lab work today shows a reassuring CBC with a normal white blood cell count, overall reassuring chemistry creatinine of 3.0 normal potassium BUN of 32.  Lipase is elevated to 421 with normal LFTs.  Possibly  indicating acute pancreatitis.  Patient denies any history of pancreatitis previously.  We will obtain CT imaging of the abdomen as a precaution.  Will treat nausea with Zofran  and IV hydrate with 500 cc of normal saline while awaiting results.  Patient's workup today shows a negative CT scan besides likely diarrheal illness however patient's lipase is significantly elevated.  Patient continues to be nauseated does not believe she could tolerate any oral intake continues to have diarrhea.  Given the patient's comorbidities being on dialysis with labs consistent with acute pancreatitis as well as an ongoing diarrheal illness we will admit to the hospitalist service for further workup and treatment.  FINAL CLINICAL IMPRESSION(S) / ED DIAGNOSES    Pancreatitis Nausea vomiting diarrhea   Note:  This document was prepared using Dragon voice recognition software and may include unintentional dictation errors.   Dorothyann Drivers, MD 08/23/24 [phone]

## 2024-08-23 NOTE — H&P (Signed)
 History and Physical    [name] FMW:[id number] DOB: 08/21/1941 DOA: 08/23/2024  DOS: the patient was seen and examined on 08/23/2024  PCP: Dineen Channel, PA-C   Patient coming from: Home  I have personally briefly reviewed patient's old medical records in [facility] and CareEverywhere  HPI:   [name] is a 83 y.o. year old female with medical history of HTN, HLD, T2DM, ESRD on HD presenting to the ED with nausea, vomiting and diarrhea.   Pt states she developed diarreha and n/v today. Both started at the same time. States at autonation yesterday but nothing out of the ordinary. Pt's niece who takes care of the patient developed similar symptoms and the niece's husband. Reports chills. No fevers and myalgia. Has been having some coughing.    On arrival to the ED patient was noted to be HDS stable.  Lab work and imaging obtained.  CBC without leukocytosis but WBC at upper limit of normal.  CMP without any acute electrolyte abnormalities but chronic ESRD changes.  Lipase elevated at 421.  Magnesium  low at 1.6.  CT abdomen pelvis without acute findings but did show hepatomegaly with lobular liver contours. Given this, TRH consulted for admission.   Review of Systems: As mentioned in the history of present illness. All other systems reviewed and are negative.   Past Medical History:  Diagnosis Date   Actinic keratosis    Albuminuria    Allergy    Anemia    Arthritis    Asthma    Basal cell carcinoma 04/12/2017   Above right lateral brow. Nodulocystic type. EDC   Cancer (HCC)    skin   Cataract cortical, senile    CHF (congestive heart failure) (HCC)    COPD (chronic obstructive pulmonary disease) (HCC)    Diabetes mellitus without complication (HCC)    GERD (gastroesophageal reflux disease)    Hemorrhoids    History of kidney stones    Hyperlipidemia    Hypertension    Hypothyroidism    Lyme disease    No kidney function    OSA (obstructive sleep apnea)     Osteoporosis    Osteoporosis    Oxygen deficiency July 2025   Reflux esophagitis    Sleep apnea    Steatohepatitis    Steatohepatitis     Past Surgical History:  Procedure Laterality Date   A/V FISTULAGRAM N/A 12/31/2023   Procedure: A/V Fistulagram;  Surgeon: Marea Selinda RAMAN, MD;  Location: [facility] INVASIVE CV LAB;  Service: Cardiovascular;  Laterality: N/A;   ABDOMINAL HYSTERECTOMY     APPENDECTOMY     AV FISTULA PLACEMENT Left 05/19/2022   Procedure: ARTERIOVENOUS (AV) FISTULA CREATION ( BRACHIAL CEPHALIC );  Surgeon: Jama Cordella MATSU, MD;  Location: [facility] ORS;  Service: Vascular;  Laterality: Left;   CARDIAC CATHETERIZATION  1980   [facility]   CARDIAC CATHETERIZATION  08/13/2014   [facility]. no significant CAD, normal LVEDP.    CATARACT EXTRACTION     CHOLECYSTECTOMY     COLONOSCOPY     COLONOSCOPY WITH PROPOFOL  N/A 12/07/2016   Procedure: COLONOSCOPY WITH PROPOFOL ;  Surgeon: Gladis RAYMOND Mariner, MD;  Location: [facility] ENDOSCOPY;  Service: Endoscopy;  Laterality: N/A;   DIALYSIS/PERMA CATHETER INSERTION N/A 12/31/2023   Procedure: DIALYSIS/PERMA CATHETER INSERTION;  Surgeon: Marea Selinda RAMAN, MD;  Location: [facility] INVASIVE CV LAB;  Service: Cardiovascular;  Laterality: N/A;   ESOPHAGOGASTRODUODENOSCOPY     ESOPHAGOGASTRODUODENOSCOPY (EGD) WITH PROPOFOL  N/A 12/07/2016   Procedure: ESOPHAGOGASTRODUODENOSCOPY (EGD)  WITH PROPOFOL ;  Surgeon: Gladis RAYMOND Mariner, MD;  Location: [facility] ENDOSCOPY;  Service: Endoscopy;  Laterality: N/A;   ESOPHAGOGASTRODUODENOSCOPY (EGD) WITH PROPOFOL  N/A 01/07/2018   Procedure: ESOPHAGOGASTRODUODENOSCOPY (EGD) WITH PROPOFOL ;  Surgeon: Mariner Gladis RAYMOND, MD;  Location: [facility] ENDOSCOPY;  Service: Endoscopy;  Laterality: N/A;   EYE SURGERY     HEMATOMA EVACUATION Right 08/02/2023   Procedure: EVACUATION HEMATOMA;  Surgeon: Jordis Laneta FALCON, MD;  Location: [facility] ORS;  Service: General;  Laterality: Right;   HEMORRHOID SURGERY     PARTIAL HYSTERECTOMY     THYROIDECTOMY N/A 08/25/2019    Procedure: THYROIDECTOMY EXTRACTION OF SUBTOTAL COMPONENT; PARATHYROID  AUTOTRANSPLANT X1;  Surgeon: Marolyn Nest, MD;  Location: [facility] ORS;  Service: General;  Laterality: N/A;  With Nerve Monitoring(RLN)   TONSILLECTOMY       Allergies  Allergen Reactions   Ace Inhibitors     Other reaction(s): Unknown   Egg Protein-Containing Drug Products Diarrhea   Other     Other reaction(s): Other (See Comments) Eggs   Prednisone      Other reaction(s): Other (See Comments) joint pain   Risedronate     Other reaction(s): Other (See Comments)   Sulfa Antibiotics Itching and Swelling    Other reaction(s): Other (See Comments)   Sulfasalazine Other (See Comments)    Family History  Problem Relation Age of Onset   Cancer Mother        breast   Kidney disease Mother    Breast cancer Mother    Diabetes Mother    Heart disease Mother    Hypertension Mother    Hypertension Father    Heart attack Father    Cancer Father    COPD Father    Arthritis Maternal Grandfather    Kidney disease Maternal Uncle     Prior to Admission medications   Medication Sig Start Date End Date Taking? Authorizing Provider  acetaminophen  (TYLENOL ) 500 MG tablet Take 1,000 mg by mouth every 6 (six) hours as needed for mild pain or moderate pain. Patient not taking: Reported on 08/19/2024    [provider]  albuterol  (VENTOLIN  HFA) 108 (90 Base) MCG/ACT inhaler Inhale 2 puffs into the lungs every 6 (six) hours as needed for wheezing or shortness of breath. 11/14/23   Kasa, Kurian, MD  allopurinol  (ZYLOPRIM ) 100 MG tablet Take 1 tablet (100 mg total) by mouth daily. 07/07/24   Ostwalt, Janna, PA-C  amLODipine  (NORVASC ) 5 MG tablet Take 5 mg by mouth daily. 05/27/24   [provider]  apixaban  (ELIQUIS ) 2.5 MG TABS tablet Take 1 tablet (2.5 mg total) by mouth 2 (two) times daily. 07/07/24 07/07/25  Ostwalt, Janna, PA-C  azelastine  (ASTELIN ) 0.1 % nasal spray Place 2 sprays into both nostrils 2 (two)  times daily. Use in each nostril as directed 10/02/23   Tobie Eldora NOVAK, MD  calcium  carbonate (CALCIUM  600) 600 MG TABS tablet Take 600 mg by mouth daily.    [provider]  epoetin  alfa-epbx (RETACRIT ) 4000 UNIT/ML injection Inject 1 mL (4,000 Units total) into the vein every Monday, Wednesday, and Friday at 6 PM. 01/02/24   Awanda City, MD  esomeprazole  (NEXIUM ) 40 MG capsule Take 1 capsule (40 mg total) by mouth daily before breakfast. 07/07/24   Ostwalt, Janna, PA-C  ferrous sulfate  325 (65 FE) MG tablet Take 325 mg by mouth daily with breakfast.    [provider]  fluticasone  (FLONASE ) 50 MCG/ACT nasal spray Place 2 sprays into both nostrils daily. 10/02/23  Tobie Eldora NOVAK, MD  fluticasone  furoate-vilanterol (BREO ELLIPTA ) 200-25 MCG/ACT AEPB Inhale 1 puff into the lungs daily. 11/14/23   Kasa, Kurian, MD  folic acid  (FOLVITE ) 1 MG tablet Take 1 tablet (1 mg total) by mouth daily. 01/01/24   Awanda City, MD  hydrALAZINE  (APRESOLINE ) 25 MG tablet Take 1 tablet (25 mg total) by mouth in the morning and at bedtime. 08/19/24   Ostwalt, Janna, PA-C  ipratropium-albuterol  (DUONEB) 0.5-2.5 (3) MG/3ML SOLN INHALE THE CONTENTS OF ONE (1) VIAL VIA NEBULIZATION EVERY 6 HOURS AS NEEDED FORSHORTNESS OF BREATH 04/23/24   Kasa, Kurian, MD  levothyroxine  (SYNTHROID ) 150 MCG tablet TAKE 1 TABLET BY MOUTH ONCE DAILY. TAKE ON AN EMPTY STOMACH WITH A GLASS OF WATER AT LEAST 30-60 MINUTES BEFORE BREAKFAST 05/29/24   Ostwalt, Janna, PA-C  lidocaine -prilocaine  (EMLA ) cream as directed. Before dialysis 01/02/24   [provider]  metoprolol  tartrate (LOPRESSOR ) 25 MG tablet Take 0.5 tablets (12.5 mg total) by mouth 2 (two) times daily. 08/19/24   Ostwalt, Janna, PA-C  montelukast  (SINGULAIR ) 10 MG tablet TAKE 1 TABLET BY MOUTH NIGHTLY 07/22/24   Ostwalt, Janna, PA-C  nystatin powder Apply 1 Application topically 3 (three) times daily.    [provider]  rosuvastatin  (CRESTOR ) 40 MG tablet  TAKE ONE TABLET BY MOUTH EVERY EVENING 07/03/24   Ostwalt, Janna, PA-C  sertraline  (ZOLOFT ) 100 MG tablet TAKE 1 TABLET BY MOUTH DAILY 08/21/24   Ostwalt, Janna, PA-C  torsemide  (DEMADEX ) 20 MG tablet TAKE TWO TABLETS BY MOUTH ONCE DAILY 05/29/24   Ostwalt, Janna, PA-C    Social History:  reports that she has never smoked. She has never used smokeless tobacco. She reports that she does not drink alcohol and does not use drugs. Lives by her self Tobacco- Denies use. EtOH- Denies use.  Illicit drug use- denies use.  IADLs/ADLs- can perform independently at baseline.Uses cane.    Physical Exam: Vitals:   08/23/24 1221 08/23/24 1225 08/23/24 1657 08/23/24 1700  BP: (!) 150/91  (!) 158/62   Pulse: 77  87   Resp: 20  20   Temp: 98.8 F (37.1 C)  98.9 F (37.2 C)   TempSrc: Oral  Oral   SpO2: 98%  (!) 88% 96%  Weight:  77.5 kg       Gen: NAD, pleasant female HENT: NCAT CV: RRR, 3/6 systolic murmur Resp: CTAB Abd: no TTP, normal bowel sounds MSK: no asymmetry Skin: slight bruising on arms Neuro: alert and oriented x4 Psych: pleasant mood   Labs on Admission: I have personally reviewed following labs and imaging studies  CBC: Recent Labs  Lab 08/23/24 1227  WBC 10.4  HGB 12.4  HCT 39.4  MCV 92.1  PLT 192   Basic Metabolic Panel: Recent Labs  Lab 08/23/24 1227  NA 139  K 4.3  CL 98  CO2 25  GLUCOSE 139*  BUN 32*  CREATININE 3.01*  CALCIUM  10.0  MG 1.6*   GFR: Estimated Creatinine Clearance: 13.7 mL/min (A) (by C-G formula based on SCr of 3.01 mg/dL (H)). Liver Function Tests: Recent Labs  Lab 08/23/24 1227  AST 33  ALT 21  ALKPHOS 118  BILITOT 0.7  PROT 7.7  ALBUMIN 4.6   Recent Labs  Lab 08/23/24 1227  LIPASE 421*   No results for input(s): AMMONIA in the last 168 hours. Coagulation Profile: No results for input(s): INR, PROTIME in the last 168 hours. Cardiac Enzymes: No results for input(s): CKTOTAL, CKMB, CKMBINDEX,  TROPONINI, TROPONINIHS  in the last 168 hours. BNP (last 3 results) Recent Labs    03/09/24 1918 04/24/24 0633 05/18/24 0500  BNP 2,527.2* 1,057.8* 2,692.7*   HbA1C: No results for input(s): HGBA1C in the last 72 hours. CBG: No results for input(s): GLUCAP in the last 168 hours. Lipid Profile: No results for input(s): CHOL, HDL, LDLCALC, TRIG, CHOLHDL, LDLDIRECT in the last 72 hours. Thyroid  Function Tests: No results for input(s): TSH, T4TOTAL, FREET4, T3FREE, THYROIDAB in the last 72 hours. Anemia Panel: No results for input(s): VITAMINB12, FOLATE, FERRITIN, TIBC, IRON, RETICCTPCT in the last 72 hours. Urine analysis:    Component Value Date/Time   COLORURINE YELLOW (A) 09/24/2023 1050   APPEARANCEUR CLOUDY (A) 09/24/2023 1050   APPEARANCEUR Clear 09/17/2018 1028   LABSPEC 1.012 09/24/2023 1050   LABSPEC 1.013 06/20/2012 1508   PHURINE 5.0 09/24/2023 1050   GLUCOSEU NEGATIVE 09/24/2023 1050   GLUCOSEU Negative 06/20/2012 1508   HGBUR SMALL (A) 09/24/2023 1050   BILIRUBINUR NEGATIVE 09/24/2023 1050   BILIRUBINUR Negative 09/17/2018 1028   BILIRUBINUR Negative 06/20/2012 1508   KETONESUR NEGATIVE 09/24/2023 1050   PROTEINUR 100 (A) 09/24/2023 1050   NITRITE NEGATIVE 09/24/2023 1050   LEUKOCYTESUR LARGE (A) 09/24/2023 1050   LEUKOCYTESUR Negative 06/20/2012 1508    Radiological Exams on Admission: I have personally reviewed images CT ABDOMEN PELVIS WO CONTRAST Result Date: 08/23/2024 CLINICAL DATA:  Bowel obstruction suspected N/V EXAM: CT ABDOMEN AND PELVIS WITHOUT CONTRAST TECHNIQUE: Multidetector CT imaging of the abdomen and pelvis was performed following the standard protocol without IV contrast. RADIATION DOSE REDUCTION: This exam was performed according to the departmental dose-optimization program which includes automated exposure control, adjustment of the mA and/or kV according to patient size and/or use of iterative  reconstruction technique. COMPARISON:  June 10, 2022, December 29, 2016 FINDINGS: Evaluation is limited by lack of IV contrast. Lower chest: RIGHT basilar atelectasis. Mosaic attenuation throughout the lung bases. Hepatobiliary: Status post cholecystectomy. Lobular liver contours and hepatomegaly. Pancreas: No peripancreatic fat stranding. Spleen: Spleen is prominent spanning 12.3 cm. Adrenals/Urinary Tract: Adrenal glands are unremarkable. Mass of the inferior pole of the RIGHT kidney measuring slightly greater than fluid density spanning 7.2 x 7.9 by 6.1 cm, previously characterized as a benign cyst (for which no dedicated imaging follow-up is recommended). Atrophic appearance of bilateral kidneys. No hydronephrosis. No obstructing nephrolithiasis. Bladder is unremarkable. Stomach/Bowel: No evidence of bowel obstruction. Diverticulosis most pronounced in the sigmoid colon. Appendix is surgically absent. Colon is fluid-filled. Stomach is decompressed, limiting evaluation. Small hiatal hernia. Vascular/Lymphatic: Severe atherosclerotic calcifications of the nonaneurysmal abdominal aorta. No new suspicious lymphadenopathy. Reproductive: Status post hysterectomy. No adnexal masses. Other: No free air or free fluid. Musculoskeletal: Similar since 2018 fat density mass with central coarse calcifications along the RIGHT lesser trochanter, possibly a lipoma versus sequela of prior trauma. Osteopenia. No acute osseous abnormality. IMPRESSION: 1. No evidence of bowel obstruction. 2. Colon is fluid-filled as can be seen in the setting of a diarrheal illness. 3. Hepatomegaly with lobular liver contours. Recommend correlation with LFTs. Aortic Atherosclerosis (ICD10-I70.0). Electronically Signed   By: Corean Salter M.D.   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  Although her lipase is elevated she is not having any significant abdominal pain and imaging does not show any pancreatic inflammation so pancreatitis appears less likely.  Some lipase elevation may be secondary to dehydration.  We can get repeat lipase level tomorrow to see if it is improving. Getting stool studies including C. Diff and GI pathogen panel.   Chronic Problems: Restart home meds once med reconciliation is completed.  HTN: Holding home meds given GI symptoms HLD: continue home meds CHF: Holding home meds given GI symptoms T2DM: hold home meds, Monitor CBGs GERD: continue home PPI Hypothyroidism: continue home synthroid  COPD/Asthma: continue home inhalers MDD/GAD: continue home meds ESRD on HD: Gets HD on MWF. Hypovolemic on exam and s/p IVF. Monitor fluid status. Did not consult nephrology given no need.    VTE prophylaxis:  SQ Heparin   Diet: Renal diet Code Status:  Full Code Telemetry:  Admission status: Observation, Med-Surg Patient is from: Home Anticipated d/c is to: Home Anticipated d/c is in: 1-2 days   Family Communication: Updated at bedside  Consults called: None   Severity of Illness: The appropriate patient status for this patient is OBSERVATION. Observation status is judged to be reasonable and necessary in order to provide the required intensity of service to ensure the patient's safety. The patient's presenting symptoms, physical exam findings, and initial radiographic and laboratory data in the context of their medical condition is felt to place them at decreased risk  for further clinical deterioration. Furthermore, it is anticipated that the patient will be medically stable for discharge from the hospital within 2 midnights of admission.    Morene Bathe, MD [facility] DEL. [facility]

## 2024-08-23 NOTE — ED Triage Notes (Signed)
 C/O vomiting and nausea since this morning. Also diarrhea.  AAOx3. Skin warm and dry. NAD

## 2024-08-24 DIAGNOSIS — K529 Noninfective gastroenteritis and colitis, unspecified: Secondary | ICD-10-CM

## 2024-08-24 LAB — BASIC METABOLIC PANEL WITH GFR
Anion gap: 12 (ref 5–15)
BUN: 32 mg/dL — ABNORMAL HIGH (ref 8–23)
CO2: 27 mmol/L (ref 22–32)
Calcium: 9.3 mg/dL (ref 8.9–10.3)
Chloride: 99 mmol/L (ref 98–111)
Creatinine, Ser: 3.28 mg/dL — ABNORMAL HIGH (ref 0.44–1.00)
GFR, Estimated: 13 mL/min — ABNORMAL LOW (ref >60)
Glucose, Bld: 90 mg/dL (ref 70–99)
Potassium: 3.7 mmol/L (ref 3.5–5.1)
Sodium: 137 mmol/L (ref 135–145)

## 2024-08-24 LAB — GLUCOSE, CAPILLARY: Glucose-Capillary: 89 mg/dL (ref 70–99)

## 2024-08-24 LAB — CBC
HCT: 33.6 % — ABNORMAL LOW (ref 36.0–46.0)
Hemoglobin: 10.6 g/dL — ABNORMAL LOW (ref 12.0–15.0)
MCH: 28.9 pg (ref 26.0–34.0)
MCHC: 31.5 g/dL (ref 30.0–36.0)
MCV: 91.6 fL (ref 80.0–100.0)
Platelets: 143 K/uL — ABNORMAL LOW (ref 150–400)
RBC: 3.67 MIL/uL — ABNORMAL LOW (ref 3.87–5.11)
RDW: 17.1 % — ABNORMAL HIGH (ref 11.5–15.5)
WBC: 5.7 K/uL (ref 4.0–10.5)
nRBC: 0 % (ref 0.0–0.2)

## 2024-08-24 LAB — LIPASE, BLOOD: Lipase: 24 U/L (ref 11–51)

## 2024-08-24 LAB — MAGNESIUM: Magnesium: 1.5 mg/dL — ABNORMAL LOW (ref 1.7–2.4)

## 2024-08-24 LAB — PHOSPHORUS: Phosphorus: 3.6 mg/dL (ref 2.5–4.6)

## 2024-08-24 MED ORDER — METOPROLOL TARTRATE 25 MG PO TABS
12.5000 mg | ORAL_TABLET | Freq: Two times a day (BID) | ORAL | Status: DC
  Administered 2024-08-24 – 2024-08-27 (×4): 12.5 mg via ORAL
  Filled 2024-08-24 (×5): qty 1

## 2024-08-24 MED ORDER — PANTOPRAZOLE SODIUM 40 MG PO TBEC
40.0000 mg | DELAYED_RELEASE_TABLET | Freq: Every day | ORAL | Status: DC
  Administered 2024-08-24 – 2024-08-27 (×4): 40 mg via ORAL
  Filled 2024-08-24 (×4): qty 1

## 2024-08-24 MED ORDER — CHLORHEXIDINE GLUCONATE CLOTH 2 % EX PADS
6.0000 | MEDICATED_PAD | Freq: Every day | CUTANEOUS | Status: DC
  Administered 2024-08-24 – 2024-08-26 (×3): 6 via TOPICAL

## 2024-08-24 MED ORDER — HYDRALAZINE HCL 25 MG PO TABS
25.0000 mg | ORAL_TABLET | Freq: Three times a day (TID) | ORAL | Status: DC
  Administered 2024-08-25 – 2024-08-27 (×5): 25 mg via ORAL
  Filled 2024-08-24 (×8): qty 1

## 2024-08-24 MED ORDER — AMLODIPINE BESYLATE 5 MG PO TABS
5.0000 mg | ORAL_TABLET | Freq: Every day | ORAL | Status: DC
  Administered 2024-08-25 – 2024-08-27 (×2): 5 mg via ORAL
  Filled 2024-08-24 (×2): qty 1

## 2024-08-24 MED ORDER — ROSUVASTATIN CALCIUM 10 MG PO TABS
10.0000 mg | ORAL_TABLET | Freq: Every evening | ORAL | Status: DC
  Administered 2024-08-24 – 2024-08-26 (×3): 10 mg via ORAL
  Filled 2024-08-24 (×3): qty 1

## 2024-08-24 MED ORDER — VANCOMYCIN HCL 125 MG PO CAPS
125.0000 mg | ORAL_CAPSULE | Freq: Four times a day (QID) | ORAL | Status: DC
  Administered 2024-08-24 – 2024-08-27 (×12): 125 mg via ORAL
  Filled 2024-08-24 (×15): qty 1

## 2024-08-24 MED ORDER — IPRATROPIUM-ALBUTEROL 0.5-2.5 (3) MG/3ML IN SOLN
3.0000 mL | Freq: Four times a day (QID) | RESPIRATORY_TRACT | Status: DC | PRN
  Administered 2024-08-24 – 2024-08-27 (×5): 3 mL via RESPIRATORY_TRACT
  Filled 2024-08-24 (×5): qty 3

## 2024-08-24 MED ORDER — FLUTICASONE FUROATE-VILANTEROL 200-25 MCG/ACT IN AEPB
1.0000 | INHALATION_SPRAY | Freq: Every day | RESPIRATORY_TRACT | Status: DC
  Administered 2024-08-24 – 2024-08-27 (×4): 1 via RESPIRATORY_TRACT
  Filled 2024-08-24: qty 28

## 2024-08-24 MED ORDER — SACCHAROMYCES BOULARDII 250 MG PO CAPS
250.0000 mg | ORAL_CAPSULE | Freq: Two times a day (BID) | ORAL | Status: DC
  Administered 2024-08-24 – 2024-08-27 (×7): 250 mg via ORAL
  Filled 2024-08-24 (×8): qty 1

## 2024-08-24 MED ORDER — LEVOTHYROXINE SODIUM 50 MCG PO TABS
150.0000 ug | ORAL_TABLET | Freq: Every day | ORAL | Status: DC
  Administered 2024-08-24 – 2024-08-27 (×4): 150 ug via ORAL
  Filled 2024-08-24 (×4): qty 1

## 2024-08-24 MED ORDER — DEXTROSE 50 % IV SOLN: 12.5000 g | INTRAVENOUS | Status: DC

## 2024-08-24 MED ORDER — MAGNESIUM SULFATE 2 GM/50ML IV SOLN
2.0000 g | Freq: Once | INTRAVENOUS | Status: AC
  Administered 2024-08-24: 2 g via INTRAVENOUS
  Filled 2024-08-24: qty 50

## 2024-08-24 MED ORDER — SERTRALINE HCL 50 MG PO TABS
100.0000 mg | ORAL_TABLET | Freq: Every day | ORAL | Status: DC
  Administered 2024-08-24 – 2024-08-27 (×4): 100 mg via ORAL
  Filled 2024-08-24 (×4): qty 2

## 2024-08-24 NOTE — TOC Progression Note (Signed)
 Transition of Care [facility]) - Progression Note    Patient Details  Name: [name] MRN: [id number] Date of Birth: [birth year]  [facility] of [facility] CM/SW Contact  K'La JINNY Ruts, LCSW Phone Number: 08/24/2024, 10:55 AM  Clinical Narrative:    Chart reviewed. Patient needs PT/OT evals.                     Expected Discharge Plan and Services                                               Social Drivers of Health (SDOH) Interventions SDOH Screenings   Food Insecurity: No Food Insecurity (08/23/2024)  Housing: Low Risk  (08/23/2024)  Transportation Needs: No Transportation Needs (08/23/2024)  Utilities: Not At Risk (08/23/2024)  Alcohol Screen: Low Risk  (05/27/2024)  Depression (PHQ2-9): Low Risk  (08/19/2024)  Financial Resource Strain: Medium Risk (08/18/2024)  Physical Activity: Sufficiently Active (08/18/2024)  Recent Concern: Physical Activity - Insufficiently Active (05/27/2024)  Social Connections: Moderately Integrated (08/23/2024)  Recent Concern: Social Connections - Moderately Isolated (05/27/2024)  Stress: No Stress Concern Present (08/18/2024)  Tobacco Use: Low Risk  (08/23/2024)  Health Literacy: Adequate Health Literacy (05/27/2024)    Readmission Risk Interventions    05/20/2024    3:15 PM 07/30/2023   10:37 AM 04/19/2023    1:10 PM  Readmission Risk Prevention Plan  Transportation Screening Complete Complete Complete  Medication Review Oceanographer) Complete Complete Complete  PCP or Specialist appointment within 3-5 days of discharge Complete Complete   HRI or Home Care Consult Complete Complete Complete  SW Recovery Care/Counseling Consult Complete Complete Complete  Palliative Care Screening Not Applicable Not Applicable Not Applicable  Skilled Nursing Facility Not Applicable Not Applicable Not Applicable

## 2024-08-24 NOTE — Plan of Care (Signed)
  Problem: Education: Goal: Knowledge of General Education information will improve Description: Including pain rating scale, medication(s)/side effects and non-pharmacologic comfort measures Outcome: Progressing   Problem: Health Behavior/Discharge Planning: Goal: Ability to manage health-related needs will improve Outcome: Progressing   Problem: Clinical Measurements: Goal: Ability to maintain clinical measurements within normal limits will improve Outcome: Progressing Goal: Will remain free from infection Outcome: Progressing Goal: Respiratory complications will improve Outcome: Progressing   Problem: Nutrition: Goal: Adequate nutrition will be maintained Outcome: Progressing   Problem: Coping: Goal: Level of anxiety will decrease Outcome: Progressing   

## 2024-08-24 NOTE — Progress Notes (Signed)
 Mobility Specialist Progress Note:    08/24/24 0837  Mobility  Activity Ambulated with assistance  Level of Assistance Standby assist, set-up cues, supervision of patient - no hands on  Assistive Device Front wheel walker  Distance Ambulated (ft) 18 ft  Range of Motion/Exercises Active;All extremities  Activity Response Tolerated well  Mobility visit 1 Mobility  Mobility Specialist Start Time (ACUTE ONLY) 0827  Mobility Specialist Stop Time (ACUTE ONLY) 0837  Mobility Specialist Time Calculation (min) (ACUTE ONLY) 10 min   Assisted pt to bathroom, required supervision to stand and ambulate with RW. Tolerated well, asx throughout. Left pt sitting EOB, alarm on and belongings in reach. All needs met.  Sherrilee Ditty Mobility Specialist Please contact via Special Educational Needs Teacher or  Rehab office at [phone]

## 2024-08-24 NOTE — Plan of Care (Signed)

## 2024-08-24 NOTE — Progress Notes (Signed)
 [facility]  ROUNDING NOTE   Subjective:  Patient is known to our practice and receives outpatient dialysis treatments at [facility], on a MWF schedule, supervised by Dr Marcelino.  Patient decided it would be safer to come to the hospital instead of dialysis because of her nausea, vomiting and diarrhea. Denies sick contacts. Patient would like to defer dialysis to tomorrow since she feels so bad.   Objective:  Vital signs in last 24 hours:  Temp:  [97.8 F (36.6 C)-98.9 F (37.2 C)] 97.8 F (36.6 C) (11/23 0904) Pulse Rate:  [71-90] 71 (11/23 0904) Resp:  [16-20] 18 (11/23 0904) BP: (122-158)/(34-62) 149/53 (11/23 0904) SpO2:  [88 %-98 %] 97 % (11/23 0904) Weight:  [76.9 kg-77.3 kg] 77.3 kg (11/23 0410)  Weight change:  Filed Weights   08/23/24 1225 08/23/24 1951 08/24/24 0410  Weight: 77.5 kg 76.9 kg 77.3 kg    Intake/Output: No intake/output data recorded.   Intake/Output this shift:  Total I/O In: 240 [P.O.:240] Out: -   Physical Exam: General: Ill-appearing  Head: Normocephalic  Eyes: Anicteric  Lungs:  Clear to auscultation  Heart: Regular rate  Abdomen:  Soft, tender,   Extremities:  No peripheral edema.  Neurologic: Alert, conversant, pleasant  Skin: warm  Access: Left AVF in use +thrill/+bruit, Left permcath    Basic Metabolic Panel: Recent Labs  Lab 08/23/24 1227 08/24/24 0443  NA 139 137  K 4.3 3.7  CL 98 99  CO2 25 27  GLUCOSE 139* 90  BUN 32* 32*  CREATININE 3.01* 3.28*  CALCIUM  10.0 9.3  MG 1.6* 1.5*  PHOS  --  3.6    Liver Function Tests: Recent Labs  Lab 08/23/24 1227  AST 33  ALT 21  ALKPHOS 118  BILITOT 0.7  PROT 7.7  ALBUMIN 4.6   Recent Labs  Lab 08/23/24 1227 08/24/24 0443  LIPASE 421* 24   No results for input(s): AMMONIA in the last 168 hours.  CBC: Recent Labs  Lab 08/23/24 1227 08/24/24 0443  WBC 10.4 5.7  HGB 12.4 10.6*  HCT 39.4 33.6*  MCV 92.1 91.6  PLT 192 143*    Cardiac  Enzymes: No results for input(s): CKTOTAL, CKMB, CKMBINDEX, TROPONINI in the last 168 hours.  BNP: Invalid input(s): POCBNP  CBG: Recent Labs  Lab 08/24/24 0929  GLUCAP 89    Microbiology: Results for orders placed or performed during the hospital encounter of 08/23/24  Gastrointestinal Panel by PCR , Stool     Status: Abnormal   Collection Time: 08/23/24  4:59 PM   Specimen: Stool  Result Value Ref Range Status   Campylobacter species NOT DETECTED NOT DETECTED Final   Plesimonas shigelloides NOT DETECTED NOT DETECTED Final   Salmonella species NOT DETECTED NOT DETECTED Final   Yersinia enterocolitica NOT DETECTED NOT DETECTED Final   Vibrio species NOT DETECTED NOT DETECTED Final   Vibrio cholerae NOT DETECTED NOT DETECTED Final   Enteroaggregative E coli (EAEC) NOT DETECTED NOT DETECTED Final   Enteropathogenic E coli (EPEC) NOT DETECTED NOT DETECTED Final   Enterotoxigenic E coli (ETEC) NOT DETECTED NOT DETECTED Final   Shiga like toxin producing E coli (STEC) NOT DETECTED NOT DETECTED Final   Shigella/Enteroinvasive E coli (EIEC) NOT DETECTED NOT DETECTED Final   Cryptosporidium NOT DETECTED NOT DETECTED Final   Cyclospora cayetanensis NOT DETECTED NOT DETECTED Final   Entamoeba histolytica NOT DETECTED NOT DETECTED Final   Giardia lamblia NOT DETECTED NOT DETECTED Final   Adenovirus  F40/41 NOT DETECTED NOT DETECTED Final   Astrovirus NOT DETECTED NOT DETECTED Final   Norovirus GI/GII DETECTED (A) NOT DETECTED Final    Comment: RESULT CALLED TO, READ BACK BY AND VERIFIED WITH: LEVI ELLIOT, RN @2029  08/23/2024 COP    Rotavirus A NOT DETECTED NOT DETECTED Final   Sapovirus (I, II, IV, and V) NOT DETECTED NOT DETECTED Final    Comment: Performed at [facility], [address] [place], [place] [address]  C Difficile Quick Screen w PCR reflex     Status: Abnormal   Collection Time: 08/23/24  4:59 PM   Specimen: Stool  Result Value Ref Range Status    C Diff antigen POSITIVE (A) NEGATIVE Final   C Diff toxin NEGATIVE NEGATIVE Final   C Diff interpretation Results are indeterminate. See PCR results.  Final    Comment: Performed at [facility], [address] [place], [place] [address]  C. Diff by PCR, Reflexed     Status: Abnormal   Collection Time: 08/23/24  4:59 PM  Result Value Ref Range Status   Toxigenic C. Difficile by PCR POSITIVE (A) NEGATIVE Final    Comment: Positive for toxigenic C. difficile with little to no toxin production. Only treat if clinical presentation suggests symptomatic illness.   Hypervirulent Strain PRESUMPTIVE NEGATIVE PRESUMPTIVE NEGATIVE Final    Comment: Performed at [facility] [address] [place], [place] [address]    Coagulation Studies: No results for input(s): LABPROT, INR in the last 72 hours.  Urinalysis: Recent Labs    08/23/24 1859  COLORURINE YELLOW*  LABSPEC 1.013  PHURINE 7.0  GLUCOSEU NEGATIVE  HGBUR NEGATIVE  BILIRUBINUR NEGATIVE  KETONESUR NEGATIVE  PROTEINUR 100*  NITRITE NEGATIVE  LEUKOCYTESUR SMALL*      Imaging: CT ABDOMEN PELVIS WO CONTRAST Result Date: 08/23/2024 CLINICAL DATA:  Bowel obstruction suspected N/V EXAM: CT ABDOMEN AND PELVIS WITHOUT CONTRAST TECHNIQUE: Multidetector CT imaging of the abdomen and pelvis was performed following the standard protocol without IV contrast. RADIATION DOSE REDUCTION: This exam was performed according to the departmental dose-optimization program which includes automated exposure control, adjustment of the mA and/or kV according to patient size and/or use of iterative reconstruction technique. COMPARISON:  June 10, 2022, December 29, 2016 FINDINGS: Evaluation is limited by lack of IV contrast. Lower chest: RIGHT basilar atelectasis. Mosaic attenuation throughout the lung bases. Hepatobiliary: Status post cholecystectomy. Lobular liver contours and hepatomegaly. Pancreas: No peripancreatic fat stranding.  Spleen: Spleen is prominent spanning 12.3 cm. Adrenals/Urinary Tract: Adrenal glands are unremarkable. Mass of the inferior pole of the RIGHT kidney measuring slightly greater than fluid density spanning 7.2 x 7.9 by 6.1 cm, previously characterized as a benign cyst (for which no dedicated imaging follow-up is recommended). Atrophic appearance of bilateral kidneys. No hydronephrosis. No obstructing nephrolithiasis. Bladder is unremarkable. Stomach/Bowel: No evidence of bowel obstruction. Diverticulosis most pronounced in the sigmoid colon. Appendix is surgically absent. Colon is fluid-filled. Stomach is decompressed, limiting evaluation. Small hiatal hernia. Vascular/Lymphatic: Severe atherosclerotic calcifications of the nonaneurysmal abdominal aorta. No new suspicious lymphadenopathy. Reproductive: Status post hysterectomy. No adnexal masses. Other: No free air or free fluid. Musculoskeletal: Similar since 2018 fat density mass with central coarse calcifications along the RIGHT lesser trochanter, possibly a lipoma versus sequela of prior trauma. Osteopenia. No acute osseous abnormality. IMPRESSION: 1. No evidence of bowel obstruction. 2. Colon is fluid-filled as can be seen in the setting of a diarrheal illness. 3. Hepatomegaly with lobular liver contours. Recommend correlation with  LFTs. Aortic Atherosclerosis (ICD10-I70.0). Electronically Signed   By: Corean Salter M.D.   On: 08/23/2024 14:28     Medications:    magnesium  sulfate bolus IVPB      apixaban   2.5 mg Oral BID   Chlorhexidine  Gluconate Cloth  6 each Topical Daily   fluticasone  furoate-vilanterol  1 puff Inhalation Daily   levothyroxine   150 mcg Oral Q0600   pantoprazole   40 mg Oral Daily   saccharomyces boulardii  250 mg Oral BID   sertraline   100 mg Oral Daily   sodium chloride  flush  3 mL Intravenous Q12H   vancomycin   125 mg Oral QID   acetaminophen  **OR** acetaminophen , ipratropium-albuterol , ondansetron  (ZOFRAN ) IV,  senna-docusate  Assessment/ Plan:  [name] is a 83 y.o.  female with past medical conditions including diastolic heart failure, hypertension, hypothyroidism, COPD, and end stage renal disease on hemodialysis. Patient presents to the emergency department complaining nausea, vomiting and diarrhea, found to have elevated lipase and positive GI panel for norovirus.  [facility]/MWF /76kg   Acute gastroenteritis   Patient GI panel positive for Norovirus. Lipase 421 in the ED. Supportive measures in place.    2.  ESRD on hemodialysis.    Last dialysis outpatient on 08/22/24. Next dialysis planned for tomorrow.    3. Anemia of chronic kidney disease  Hgb 10.6 Will monitor need for ESA this admission. Patient receives Mircera outpatient.    4.  Hypertension with chronic kidney disease.  Home regimen includes amlodipine , furosemide , and hydralazine . Currently not on any. Blood pressure 149/53 today  5. Secondary Hyperparathyroidism  Outpatient labs PTH 130, Pho 4.3, Corrected Ca 8.6 as of 08/11/24    LOS: 0 [name] P [name] 11/23/202512:25 PM

## 2024-08-24 NOTE — Progress Notes (Signed)
 [facility] Progress Note  Patient: [name]    FMW:[id number]  DOA: 08/23/2024     Date of Service: the patient was seen and examined on 08/24/2024  Chief Complaint  Patient presents with   Emesis   Brief hospital course:  MALEENA EDDLEMAN is a 83 y.o. year old female with medical history of HTN, HLD, T2DM, ESRD on HD presenting to the ED with nausea, vomiting and diarrhea.     ED workup: HDS stable.  CBC: without leukocytosis  CMP: without any acute electrolyte abnormalities but chronic ESRD changes.   Lipase elevated at 421.   Magnesium  low at 1.6.   CT abdomen pelvis without acute findings but did show hepatomegaly with lobular liver contours.   TRH consulted for admission.    Assessment and Plan:  # Acute gastroenteritis secondary to Norovirus infection and C. Difficile C. difficile antigen positive and toxin PCR positive Started vancomycin  125 mg p.o. Q6 hourly x 10 days Started probiotics twice daily Continue oral hydration  # Hypomagnesemia, mag repleted. Monitor electrolytes daily and replete as needed.  # ESRD on hemodialysis Nephrology consulted Patient will get hemodialysis tomorrow a.m.  # HTN Resumed amlodipine , hydralazine  and metoprolol  home dose Monitor BP and titrate medications accordingly  # Paroxysmal A-fib/SVTs: Continue Eliquis  home dose Resumed Lopressor  12.5 mg p.o. twice daily  # History of prediabetes in the past  Patient is not on any medication at home Monitor CBG   # COPD, continued inhalers. # Hypothyroid: Synthroid  # Depression: Zoloft  # Gout, resume allopurinol  on discharge   Body mass index is 31.17 kg/m.  Interventions:  Diet: Soft diet DVT Prophylaxis: Eliquis   Advance goals of care discussion: Full code  Family Communication: family was not present at bedside, at the time of interview.  The pt provided permission to discuss medical plan with the family. Opportunity was given to ask question and all  questions were answered satisfactorily.   Disposition:  Pt is from Home, admitted with acute gastroenteritis, still has significant diarrhea, which precludes a safe discharge. Discharge to home, when stable, most likely in 1 to 2 days..  Subjective: No significant events overnight.  Patient denies any abdominal pain, no nausea vomiting but she had 4 episodes of diarrhea today morning. Denied any chest pain or palpitation, no any other complaints.  Physical Exam: General: NAD, lying comfortably Appear in no distress, affect appropriate Eyes: PERRLA ENT: Oral Mucosa Clear, moist  Neck: no JVD,  Cardiovascular: S1 and S2 Present, no Murmur,  Respiratory: good respiratory effort, Bilateral Air entry equal and Decreased, no Crackles, no wheezes Abdomen: Bowel Sound present, Soft and no tenderness,  Skin: no rashes Extremities: no Pedal edema, no calf tenderness Neurologic: without any new focal findings Gait not checked due to patient safety concerns  Vitals:   08/23/24 2335 08/24/24 0402 08/24/24 0410 08/24/24 0904  BP:  (!) 122/34  (!) 149/53  Pulse: 90 71  71  Resp:  16  18  Temp:  98.1 F (36.7 C)  97.8 F (36.6 C)  TempSrc:    Oral  SpO2: 96% 95%  97%  Weight:   77.3 kg   Height:        Intake/Output Summary (Last 24 hours) at 08/24/2024 1536 Last data filed at 08/24/2024 1424 Gross per 24 hour  Intake 420 ml  Output --  Net 420 ml   Filed Weights   08/23/24 1225 08/23/24 1951 08/24/24 0410  Weight: 77.5 kg 76.9 kg 77.3 kg  Data Reviewed: I have personally reviewed and interpreted daily labs, tele strips, imagings as discussed above. I reviewed all nursing notes, pharmacy notes, vitals, pertinent old records I have discussed plan of care as described above with RN and patient/family.  CBC: Recent Labs  Lab 08/23/24 1227 08/24/24 0443  WBC 10.4 5.7  HGB 12.4 10.6*  HCT 39.4 33.6*  MCV 92.1 91.6  PLT 192 143*   Basic Metabolic Panel: Recent Labs  Lab  08/23/24 1227 08/24/24 0443  NA 139 137  K 4.3 3.7  CL 98 99  CO2 25 27  GLUCOSE 139* 90  BUN 32* 32*  CREATININE 3.01* 3.28*  CALCIUM  10.0 9.3  MG 1.6* 1.5*  PHOS  --  3.6    Studies: No results found.  Scheduled Meds:  apixaban   2.5 mg Oral BID   Chlorhexidine  Gluconate Cloth  6 each Topical Daily   fluticasone  furoate-vilanterol  1 puff Inhalation Daily   levothyroxine   150 mcg Oral Q0600   pantoprazole   40 mg Oral Daily   saccharomyces boulardii  250 mg Oral BID   sertraline   100 mg Oral Daily   sodium chloride  flush  3 mL Intravenous Q12H   vancomycin   125 mg Oral QID   Continuous Infusions:  magnesium  sulfate bolus IVPB 2 g (08/24/24 1447)   PRN Meds: acetaminophen  **OR** acetaminophen , ipratropium-albuterol , ondansetron  (ZOFRAN ) IV, senna-docusate  Time spent: 35 minutes  Author: ELVAN SOR. MD [facility] 08/24/2024 3:36 PM  To reach On-call, see care teams to locate the attending and reach out to them via [email or web address]. If 7PM-7AM, please contact night-coverage If you still have difficulty reaching the attending provider, please page the [facility] (Director on Call) for [facility] on amion for assistance.

## 2024-08-24 NOTE — Care Management Obs Status (Signed)
 MEDICARE OBSERVATION STATUS NOTIFICATION   Patient Details  Name: [name] MRN: [id number] Date of Birth: 1941-05-21   Medicare Observation Status Notification Given:  Yes    [name] 08/24/2024, 5:12 PM

## 2024-08-24 NOTE — Progress Notes (Signed)
 An order was placed by Dr Von for a stat Dextrose  to be given. He confirmed that he did not want that given and it was just for the protocol for hypoglycemia

## 2024-08-25 ENCOUNTER — Telehealth (HOSPITAL_COMMUNITY): Payer: Self-pay | Admitting: Pharmacy Technician

## 2024-08-25 ENCOUNTER — Other Ambulatory Visit (HOSPITAL_COMMUNITY): Payer: Self-pay

## 2024-08-25 DIAGNOSIS — K529 Noninfective gastroenteritis and colitis, unspecified: Secondary | ICD-10-CM | POA: Diagnosis not present

## 2024-08-25 LAB — BASIC METABOLIC PANEL WITH GFR
Anion gap: 12 (ref 5–15)
BUN: 52 mg/dL — ABNORMAL HIGH (ref 8–23)
CO2: 25 mmol/L (ref 22–32)
Calcium: 9.6 mg/dL (ref 8.9–10.3)
Chloride: 100 mmol/L (ref 98–111)
Creatinine, Ser: 3.59 mg/dL — ABNORMAL HIGH (ref 0.44–1.00)
GFR, Estimated: 12 mL/min — ABNORMAL LOW (ref >60)
Glucose, Bld: 112 mg/dL — ABNORMAL HIGH (ref 70–99)
Potassium: 4.4 mmol/L (ref 3.5–5.1)
Sodium: 136 mmol/L (ref 135–145)

## 2024-08-25 LAB — CBC
HCT: 34.5 % — ABNORMAL LOW (ref 36.0–46.0)
Hemoglobin: 11 g/dL — ABNORMAL LOW (ref 12.0–15.0)
MCH: 29 pg (ref 26.0–34.0)
MCHC: 31.9 g/dL (ref 30.0–36.0)
MCV: 91 fL (ref 80.0–100.0)
Platelets: 170 K/uL (ref 150–400)
RBC: 3.79 MIL/uL — ABNORMAL LOW (ref 3.87–5.11)
RDW: 16.9 % — ABNORMAL HIGH (ref 11.5–15.5)
WBC: 5.7 K/uL (ref 4.0–10.5)
nRBC: 0 % (ref 0.0–0.2)

## 2024-08-25 LAB — HEPATITIS B SURFACE ANTIGEN: Hepatitis B Surface Ag: NONREACTIVE

## 2024-08-25 LAB — PHOSPHORUS: Phosphorus: 3.7 mg/dL (ref 2.5–4.6)

## 2024-08-25 LAB — MAGNESIUM: Magnesium: 2.2 mg/dL (ref 1.7–2.4)

## 2024-08-25 LAB — GLUCOSE, CAPILLARY: Glucose-Capillary: 101 mg/dL — ABNORMAL HIGH (ref 70–99)

## 2024-08-25 MED ORDER — HEPARIN SODIUM (PORCINE) 1000 UNIT/ML IJ SOLN
INTRAMUSCULAR | Status: AC
  Filled 2024-08-25: qty 5

## 2024-08-25 MED ORDER — IPRATROPIUM-ALBUTEROL 0.5-2.5 (3) MG/3ML IN SOLN
3.0000 mL | Freq: Two times a day (BID) | RESPIRATORY_TRACT | Status: DC
  Administered 2024-08-25 – 2024-08-26 (×2): 3 mL via RESPIRATORY_TRACT
  Filled 2024-08-25 (×2): qty 3

## 2024-08-25 NOTE — Progress Notes (Signed)
 PT Cancellation Note  Patient Details Name: [name] MRN: [id number] DOB: [birth year]   Cancelled Treatment:    Reason Eval/Treat Not Completed: Other (comment). Currently out of room for HD. Will re-attempt, time permitting.   Desha Bitner 08/25/2024, 9:09 AM Corean Dade, PT, DPT, GCS [phone]

## 2024-08-25 NOTE — Progress Notes (Signed)
 Pt receives outpt HD at [facility] on MWF@ 11am. Navigator following to assist with any HD needs.   Suzen Satchel Dialysis Navigator [phone]

## 2024-08-25 NOTE — Progress Notes (Signed)
 PT Cancellation Note  Patient Details Name: [name] MRN: [id number] DOB: 23-Jun-1941   Cancelled Treatment:    Reason Eval/Treat Not Completed: Other (comment). Per OT, pt is indep with mobility and at functional baseline. Will dc in house at this time. Please re-order if needs change.   Abdulkareem Badolato 08/25/2024, 2:46 PM Corean Dade, PT, DPT, GCS [phone]

## 2024-08-25 NOTE — Progress Notes (Signed)
 [facility] Progress Note  Patient: [name]    FMW:[id number]  DOA: 08/23/2024     Date of Service: the patient was seen and examined on 08/25/2024  Chief Complaint  Patient presents with   Emesis   Brief hospital course:  [name] is a 83 y.o. year old female with medical history of HTN, HLD, T2DM, ESRD on HD presenting to the ED with nausea, vomiting and diarrhea.     ED workup: HDS stable.  CBC: without leukocytosis  CMP: without any acute electrolyte abnormalities but chronic ESRD changes.   Lipase elevated at 421.   Magnesium  low at 1.6.   CT abdomen pelvis without acute findings but did show hepatomegaly with lobular liver contours.   TRH consulted for admission.    Assessment and Plan:  # Acute gastroenteritis secondary to Norovirus infection and C. Difficile C. difficile antigen positive and toxin PCR positive Continue vancomycin  125 mg p.o. Q6 hourly x 10 days Continue probiotics twice daily Continue oral hydration  # Hypomagnesemia, mag repleted.  Resolved Monitor electrolytes daily and replete as needed.  # ESRD on hemodialysis Nephrology consulted Patient will get hemodialysis tomorrow a.m.  # HTN Resumed amlodipine , hydralazine  and metoprolol  home dose Monitor BP and titrate medications accordingly  # Paroxysmal A-fib/SVTs: Continue Eliquis  home dose Resumed Lopressor  12.5 mg p.o. twice daily  # History of prediabetes in the past  Patient is not on any medication at home Monitor CBG   # COPD, continued inhalers. # Hypothyroid: Synthroid  # Depression: Zoloft  # Gout, resume allopurinol  on discharge   Body mass index is 30.81 kg/m.  Interventions:  Diet: Soft diet DVT Prophylaxis: Eliquis   Advance goals of care discussion: Full code  Family Communication: family was not present at bedside, at the time of interview.  The pt provided permission to discuss medical plan with the family. Opportunity was given to ask question  and all questions were answered satisfactorily.   Disposition:  Pt is from Home, admitted with acute gastroenteritis, still has significant diarrhea, which precludes a safe discharge. Discharge to home, when stable, most likely in 1 to 2 days..  Subjective: No significant events overnight.  As per patient she had several episode of diarrhea yesterday but today she had only 4 episodes which is small amount and consistency is getting better it is not watery like yesterday. Denied any abdominal pain, no nausea vomiting.  She is tolerating diet well.   Physical Exam: General: NAD, lying comfortably Appear in no distress, affect appropriate Eyes: PERRLA ENT: Oral Mucosa Clear, moist  Neck: no JVD,  Cardiovascular: S1 and S2 Present, no Murmur,  Respiratory: good respiratory effort, Bilateral Air entry equal and Decreased, no Crackles, no wheezes Abdomen: Bowel Sound present, Soft and no tenderness,  Skin: no rashes Extremities: no Pedal edema, no calf tenderness Neurologic: without any new focal findings Gait not checked due to patient safety concerns  Vitals:   08/25/24 1300 08/25/24 1318 08/25/24 1353 08/25/24 1400  BP: (!) 118/46 (!) 121/44    Pulse: 63 60    Resp: 15 (!) 27  20  Temp:  98.3 F (36.8 C)    TempSrc:  Oral    SpO2: 96% 99%    Weight:   76.4 kg   Height:        Intake/Output Summary (Last 24 hours) at 08/25/2024 1638 Last data filed at 08/25/2024 1318 Gross per 24 hour  Intake 287.92 ml  Output 0.6 ml  Net 287.32 ml  Filed Weights   08/25/24 0500 08/25/24 0700 08/25/24 1353  Weight: 77.7 kg 77.6 kg 76.4 kg    Data Reviewed: I have personally reviewed and interpreted daily labs, tele strips, imagings as discussed above. I reviewed all nursing notes, pharmacy notes, vitals, pertinent old records I have discussed plan of care as described above with RN and patient/family.  CBC: Recent Labs  Lab 08/23/24 1227 08/24/24 0443 08/25/24 0533  WBC 10.4  5.7 5.7  HGB 12.4 10.6* 11.0*  HCT 39.4 33.6* 34.5*  MCV 92.1 91.6 91.0  PLT 192 143* 170   Basic Metabolic Panel: Recent Labs  Lab 08/23/24 1227 08/24/24 0443 08/25/24 0533  NA 139 137 136  K 4.3 3.7 4.4  CL 98 99 100  CO2 25 27 25   GLUCOSE 139* 90 112*  BUN 32* 32* 52*  CREATININE 3.01* 3.28* 3.59*  CALCIUM  10.0 9.3 9.6  MG 1.6* 1.5* 2.2  PHOS  --  3.6 3.7    Studies: No results found.  Scheduled Meds:  amLODipine   5 mg Oral Daily   apixaban   2.5 mg Oral BID   Chlorhexidine  Gluconate Cloth  6 each Topical Daily   fluticasone  furoate-vilanterol  1 puff Inhalation Daily   hydrALAZINE   25 mg Oral Q8H   ipratropium-albuterol   3 mL Nebulization BID   levothyroxine   150 mcg Oral Q0600   metoprolol  tartrate  12.5 mg Oral BID   pantoprazole   40 mg Oral Daily   rosuvastatin   10 mg Oral QPM   saccharomyces boulardii  250 mg Oral BID   sertraline   100 mg Oral Daily   sodium chloride  flush  3 mL Intravenous Q12H   vancomycin   125 mg Oral QID   Continuous Infusions:   PRN Meds: acetaminophen  **OR** acetaminophen , ipratropium-albuterol , ondansetron  (ZOFRAN ) IV  Time spent: 35 minutes  Author: ELVAN SOR. MD [facility] 08/25/2024 4:38 PM  To reach On-call, see care teams to locate the attending and reach out to them via [email or web address]. If 7PM-7AM, please contact night-coverage If you still have difficulty reaching the attending provider, please page the [facility] (Director on Call) for [facility] on amion for assistance.

## 2024-08-25 NOTE — Progress Notes (Signed)
 [facility]  ROUNDING NOTE   Subjective:  Patient is known to our practice and receives outpatient dialysis treatments at [facility], on a MWF schedule, supervised by Dr Marcelino.    Patient seen and evaluated during dialysis   HEMODIALYSIS FLOWSHEET:  Blood Flow Rate (mL/min): 299 mL/min Arterial Pressure (mmHg): -182.82 mmHg Venous Pressure (mmHg): 218.17 mmHg TMP (mmHg): 12.12 mmHg Ultrafiltration Rate (mL/min): 543 mL/min Dialysate Flow Rate (mL/min): 299 ml/min Dialysis Fluid Bolus: Normal Saline Bolus Amount (mL): 100 mL  Denies shortness of breath Continues to have weakness   Objective:  Vital signs in last 24 hours:  Temp:  [97.2 F (36.2 C)-98.6 F (37 C)] 98.1 F (36.7 C) (11/24 0914) Pulse Rate:  [56-90] 58 (11/24 1000) Resp:  [10-24] 24 (11/24 1000) BP: (124-173)/(39-61) 124/39 (11/24 1000) SpO2:  [95 %-100 %] 97 % (11/24 1000) FiO2 (%):  [28 %] 28 % (11/23 2235) Weight:  [77.6 kg-77.7 kg] 77.6 kg (11/24 0700)  Weight change: 0.2 kg Filed Weights   08/24/24 0410 08/25/24 0500 08/25/24 0700  Weight: 77.3 kg 77.7 kg 77.6 kg    Intake/Output: I/O last 3 completed shifts: In: 707.9 [P.O.:660; IV Piggyback:47.9] Out: -    Intake/Output this shift:  No intake/output data recorded.  Physical Exam: General: NAD  Head: Normocephalic  Eyes: Anicteric  Lungs:  Clear to auscultation  Heart: Regular rate  Abdomen:  Soft, nontender  Extremities:  No peripheral edema.  Neurologic: Alert, conversant, pleasant  Skin: warm  Access: Left AVF in use +thrill/+bruit, Left permcath    Basic Metabolic Panel: Recent Labs  Lab 08/23/24 1227 08/24/24 0443 08/25/24 0533  NA 139 137 136  K 4.3 3.7 4.4  CL 98 99 100  CO2 25 27 25   GLUCOSE 139* 90 112*  BUN 32* 32* 52*  CREATININE 3.01* 3.28* 3.59*  CALCIUM  10.0 9.3 9.6  MG 1.6* 1.5* 2.2  PHOS  --  3.6 3.7    Liver Function Tests: Recent Labs  Lab 08/23/24 1227  AST 33  ALT 21   ALKPHOS 118  BILITOT 0.7  PROT 7.7  ALBUMIN 4.6   Recent Labs  Lab 08/23/24 1227 08/24/24 0443  LIPASE 421* 24   No results for input(s): AMMONIA in the last 168 hours.  CBC: Recent Labs  Lab 08/23/24 1227 08/24/24 0443 08/25/24 0533  WBC 10.4 5.7 5.7  HGB 12.4 10.6* 11.0*  HCT 39.4 33.6* 34.5*  MCV 92.1 91.6 91.0  PLT 192 143* 170    Cardiac Enzymes: No results for input(s): CKTOTAL, CKMB, CKMBINDEX, TROPONINI in the last 168 hours.  BNP: Invalid input(s): POCBNP  CBG: Recent Labs  Lab 08/24/24 0929  GLUCAP 89    Microbiology: Results for orders placed or performed during the hospital encounter of 08/23/24  Gastrointestinal Panel by PCR , Stool     Status: Abnormal   Collection Time: 08/23/24  4:59 PM   Specimen: Stool  Result Value Ref Range Status   Campylobacter species NOT DETECTED NOT DETECTED Final   Plesimonas shigelloides NOT DETECTED NOT DETECTED Final   Salmonella species NOT DETECTED NOT DETECTED Final   Yersinia enterocolitica NOT DETECTED NOT DETECTED Final   Vibrio species NOT DETECTED NOT DETECTED Final   Vibrio cholerae NOT DETECTED NOT DETECTED Final   Enteroaggregative E coli (EAEC) NOT DETECTED NOT DETECTED Final   Enteropathogenic E coli (EPEC) NOT DETECTED NOT DETECTED Final   Enterotoxigenic E coli (ETEC) NOT DETECTED NOT DETECTED Final   Shiga like  toxin producing E coli (STEC) NOT DETECTED NOT DETECTED Final   Shigella/Enteroinvasive E coli (EIEC) NOT DETECTED NOT DETECTED Final   Cryptosporidium NOT DETECTED NOT DETECTED Final   Cyclospora cayetanensis NOT DETECTED NOT DETECTED Final   Entamoeba histolytica NOT DETECTED NOT DETECTED Final   Giardia lamblia NOT DETECTED NOT DETECTED Final   Adenovirus F40/41 NOT DETECTED NOT DETECTED Final   Astrovirus NOT DETECTED NOT DETECTED Final   Norovirus GI/GII DETECTED (A) NOT DETECTED Final    Comment: RESULT CALLED TO, READ BACK BY AND VERIFIED WITH: LEVI ELLIOT, RN  @2029  08/23/2024 COP    Rotavirus A NOT DETECTED NOT DETECTED Final   Sapovirus (I, II, IV, and V) NOT DETECTED NOT DETECTED Final    Comment: Performed at [facility], [address] [place], [place] [address]  C Difficile Quick Screen w PCR reflex     Status: Abnormal   Collection Time: 08/23/24  4:59 PM   Specimen: Stool  Result Value Ref Range Status   C Diff antigen POSITIVE (A) NEGATIVE Final   C Diff toxin NEGATIVE NEGATIVE Final   C Diff interpretation Results are indeterminate. See PCR results.  Final    Comment: Performed at [facility], [address] [place], [place] [address]  C. Diff by PCR, Reflexed     Status: Abnormal   Collection Time: 08/23/24  4:59 PM  Result Value Ref Range Status   Toxigenic C. Difficile by PCR POSITIVE (A) NEGATIVE Final    Comment: Positive for toxigenic C. difficile with little to no toxin production. Only treat if clinical presentation suggests symptomatic illness.   Hypervirulent Strain PRESUMPTIVE NEGATIVE PRESUMPTIVE NEGATIVE Final    Comment: Performed at [facility], [address] [place], [place] [address]    Coagulation Studies: No results for input(s): LABPROT, INR in the last 72 hours.  Urinalysis: Recent Labs    08/23/24 1859  COLORURINE YELLOW*  LABSPEC 1.013  PHURINE 7.0  GLUCOSEU NEGATIVE  HGBUR NEGATIVE  BILIRUBINUR NEGATIVE  KETONESUR NEGATIVE  PROTEINUR 100*  NITRITE NEGATIVE  LEUKOCYTESUR SMALL*      Imaging: CT ABDOMEN PELVIS WO CONTRAST Result Date: 08/23/2024 CLINICAL DATA:  Bowel obstruction suspected N/V EXAM: CT ABDOMEN AND PELVIS WITHOUT CONTRAST TECHNIQUE: Multidetector CT imaging of the abdomen and pelvis was performed following the standard protocol without IV contrast. RADIATION DOSE REDUCTION: This exam was performed according to the departmental dose-optimization program which includes automated exposure control, adjustment of the mA and/or kV according to  patient size and/or use of iterative reconstruction technique. COMPARISON:  June 10, 2022, December 29, 2016 FINDINGS: Evaluation is limited by lack of IV contrast. Lower chest: RIGHT basilar atelectasis. Mosaic attenuation throughout the lung bases. Hepatobiliary: Status post cholecystectomy. Lobular liver contours and hepatomegaly. Pancreas: No peripancreatic fat stranding. Spleen: Spleen is prominent spanning 12.3 cm. Adrenals/Urinary Tract: Adrenal glands are unremarkable. Mass of the inferior pole of the RIGHT kidney measuring slightly greater than fluid density spanning 7.2 x 7.9 by 6.1 cm, previously characterized as a benign cyst (for which no dedicated imaging follow-up is recommended). Atrophic appearance of bilateral kidneys. No hydronephrosis. No obstructing nephrolithiasis. Bladder is unremarkable. Stomach/Bowel: No evidence of bowel obstruction. Diverticulosis most pronounced in the sigmoid colon. Appendix is surgically absent. Colon is fluid-filled. Stomach is decompressed, limiting evaluation. Small hiatal hernia. Vascular/Lymphatic: Severe atherosclerotic calcifications of the nonaneurysmal abdominal aorta. No new suspicious lymphadenopathy. Reproductive: Status post hysterectomy. No adnexal masses. Other: No free air or free fluid. Musculoskeletal:  Similar since 2018 fat density mass with central coarse calcifications along the RIGHT lesser trochanter, possibly a lipoma versus sequela of prior trauma. Osteopenia. No acute osseous abnormality. IMPRESSION: 1. No evidence of bowel obstruction. 2. Colon is fluid-filled as can be seen in the setting of a diarrheal illness. 3. Hepatomegaly with lobular liver contours. Recommend correlation with LFTs. Aortic Atherosclerosis (ICD10-I70.0). Electronically Signed   By: Corean Salter M.D.   On: 08/23/2024 14:28     Medications:      amLODipine   5 mg Oral Daily   apixaban   2.5 mg Oral BID   Chlorhexidine  Gluconate Cloth  6 each Topical Daily    fluticasone  furoate-vilanterol  1 puff Inhalation Daily   hydrALAZINE   25 mg Oral Q8H   ipratropium-albuterol   3 mL Nebulization BID   levothyroxine   150 mcg Oral Q0600   metoprolol  tartrate  12.5 mg Oral BID   pantoprazole   40 mg Oral Daily   rosuvastatin   10 mg Oral QPM   saccharomyces boulardii  250 mg Oral BID   sertraline   100 mg Oral Daily   sodium chloride  flush  3 mL Intravenous Q12H   vancomycin   125 mg Oral QID   acetaminophen  **OR** acetaminophen , ipratropium-albuterol , ondansetron  (ZOFRAN ) IV  Assessment/ Plan:  [name] is a 83 y.o.  female with past medical conditions including diastolic heart failure, hypertension, hypothyroidism, COPD, and end stage renal disease on hemodialysis. Patient presents to the emergency department complaining nausea, vomiting and diarrhea, found to have elevated lipase and positive GI panel for norovirus.  [facility]/MWF /76kg   Acute gastroenteritis   Patient GI panel positive for Norovirus. Lipase 421 in the ED. Symptoms have resolved with supportive care.   2.  ESRD on hemodialysis.    Receiving dialysis today, UF goal 1L as tolerated. Due to holiday schedule, will dialyze patient tomorrow as well.    3. Anemia of chronic kidney disease  Hgb 11.0 Patient receives Mircera outpatient.    4.  Hypertension with chronic kidney disease.  Home regimen includes amlodipine , furosemide , and hydralazine . Currently not on any. Blood pressure 124/39 during dialysis  5. Secondary Hyperparathyroidism  Outpatient labs PTH 130, Pho 4.3, Corrected Ca 8.6 as of 08/11/24    LOS: 0 [name] 11/24/202510:32 AM

## 2024-08-25 NOTE — Telephone Encounter (Signed)
 Patient Product/process Development Scientist completed.    The patient is insured through [employer]. Patient has Medicare and is not eligible for a copay card, but may be able to apply for patient assistance or Medicare RX Payment Plan (Patient Must reach out to their plan, if eligible for payment plan), if available.    Ran test claim for vancomycin  125 mg and the current 10 day co-pay is $0.00.  Ran test claim for fidaxomicin (Dificid) 200 mg and the current 10 day co-pay is $0.00.   This test claim was processed through [facility]- copay amounts may vary at other pharmacies due to pharmacy/plan contracts, or as the patient moves through the different stages of their insurance plan.     Reyes Sharps, CPHT Pharmacy Technician Patient Advocate Specialist Lead [facility] Patient Advocate Team Direct Number: [phone]  Fax: [phone]

## 2024-08-25 NOTE — Progress Notes (Signed)
   08/25/24 1318  Vitals  Temp 98.3 F (36.8 C)  Temp Source Oral  BP (!) 121/44  MAP (mmHg) 68  BP Location Right Arm  BP Method Automatic  Patient Position (if appropriate) Lying  Pulse Rate 60  Pulse Rate Source Monitor  ECG Heart Rate 61  Resp (!) 27  Oxygen Therapy  SpO2 99 %  During Treatment Monitoring  Blood Flow Rate (mL/min) 210 mL/min  Arterial Pressure (mmHg) -14.53 mmHg  Venous Pressure (mmHg) 258.38 mmHg  TMP (mmHg) 8.08 mmHg  Ultrafiltration Rate (mL/min) 0 mL/min  Dialysate Flow Rate (mL/min) 300 ml/min  Dialysate Potassium Concentration 3  Dialysate Calcium  Concentration 2.5  Duration of HD Treatment -hour(s) 3.23 hour(s)  Cumulative Fluid Removed (mL) per Treatment  584.8  HD Safety Checks Performed Yes  Intra-Hemodialysis Comments Tx completed  Post Treatment  Dialyzer Clearance Clear  Liters Processed 49  Fluid Removed (mL) 0.6 mL  Tolerated HD Treatment Yes  AVG/AVF Arterial Site Held (minutes) 10 minutes  AVG/AVF Venous Site Held (minutes) 10 minutes  Note  Patient Observations d/c 17 mins early, Bp decreased and not feeling well. NP made aware.  Fistula / Graft Left Upper arm  No placement date or time found.   Placed prior to admission: Yes  Orientation: Left  Access Location: Upper arm  Site Condition No complications  Fistula / Graft Assessment Present;Thrill;Bruit  Status Deaccessed;Flushed;Patent  Drainage Description None  Hemodialysis Catheter Left Internal jugular Double lumen Permanent (Tunneled)  Placement Date/Time: 12/31/23 1225   Serial / Lot #: [id number]  Expiration Date: 08/01/26  Time Out: Correct patient;Correct site;Correct procedure  Maximum sterile barrier precautions: Hand hygiene;Large sterile sheet;Sterile probe cover;Cap;Mask;St...  Site Condition No complications  Blue Lumen Status Flushed;Heparin  locked;Dead end cap in place  Red Lumen Status Flushed;Heparin  locked;Dead end cap in place  Purple Lumen Status N/A   Catheter fill solution Heparin  1000 units/ml  Catheter fill volume (Arterial) 2.2 cc  Catheter fill volume (Venous) 2.4  Dressing Type Transparent  Dressing Status Clean, Dry, Intact;Antimicrobial disc/dressing in place  Interventions New dressing;Dressing changed;Antimicrobial disc changed  Drainage Description None  Dressing Change Due 09/01/24  Post treatment catheter status Capped and Clamped   Pt tolerated tx well. Removed 600 ml

## 2024-08-25 NOTE — Plan of Care (Signed)

## 2024-08-25 NOTE — Evaluation (Signed)
 Occupational Therapy Evaluation Patient Details Name: [name] MRN: [id number] DOB: Sep 07, 1941 Today's Date: 08/25/2024   History of Present Illness   [name] is a 83 y.o. year old female with medical history of HTN, HLD, T2DM, ESRD on HD presenting to the ED with nausea, vomiting and diarrhea.     Clinical Impressions Pt was seen for OT evaluation this date. Prior to hospital admission, pt was MODI with ADL/IADLs, driving self, managing her own medications. Pt lives alone in two level home with laundry in the basement. VSS. Pt back to baseline functional independence. Pt educated on safe ADL completion, fall prevention techniques, ECT to use during ADL/IADLs. No skilled OT needs identified. Will sign off. Please re-consult if additional needs arise.                Functional Status Assessment   Patient has not had a recent decline in their functional status     Equipment Recommendations   None recommended by OT      Precautions/Restrictions   Precautions Precautions: None Recall of Precautions/Restrictions: Intact Restrictions Weight Bearing Restrictions Per Provider Order: No     Mobility Bed Mobility Overal bed mobility: Independent                  Transfers Overall transfer level: Modified independent Equipment used: Rolling walker (2 wheels)               General transfer comment: Utilized RW throughout mobility due to morning dialysis.      Balance Overall balance assessment: Modified Independent                                         ADL either performed or assessed with clinical judgement   ADL Overall ADL's : Modified independent;At baseline                                       General ADL Comments: Pt at her functional baseline completing her ADL with use of RW or cane. As pt states she uses a RW post dialysis as she sometimes feels dizzy right after. Pt drives herself, manages her  own medication and completes all other IADLs.     Vision Baseline Vision/History: 1 Wears glasses       Perception         Praxis         Pertinent Vitals/Pain Pain Assessment Pain Assessment: No/denies pain     Extremity/Trunk Assessment Upper Extremity Assessment Upper Extremity Assessment: Overall WFL for tasks assessed   Lower Extremity Assessment Lower Extremity Assessment: Overall WFL for tasks assessed   Cervical / Trunk Assessment Cervical / Trunk Assessment: Kyphotic   Communication Communication Communication: No apparent difficulties   Cognition Arousal: Alert Behavior During Therapy: WFL for tasks assessed/performed Cognition: No apparent impairments             OT - Cognition Comments: A/ox4                 Following commands: Intact       Cueing  General Comments   Cueing Techniques: Verbal cues  Pt eager to get home to exercises and see her neighboor   Exercises Exercises: Other exercises Other Exercises Other Exercises: Edu: Role of OT eval, safe ADL  completion, fall prevention techniques   Shoulder Instructions      Home Living Family/patient expects to be discharged to:: Private residence Living Arrangements: Alone Available Help at Discharge: Family;Neighbor;Available PRN/intermittently Type of Home: House Home Access: Stairs to enter [employer] of Steps: 1 Entrance Stairs-Rails: None Home Layout: Two level;Laundry or work area in basement;Able to live on main level with bedroom/bathroom Alternate Teacher, Music of Steps: Flight Alternate Level Stairs-Rails: Right Bathroom Shower/Tub: Chief Strategy Officer: Standard Bathroom Accessibility: Yes   Home Equipment: Agricultural Consultant (2 wheels);BSC/3in1;Tub bench;Grab bars - tub/shower;Wheelchair - manual          Prior Functioning/Environment Prior Level of Function : Independent/Modified Independent;Driving             Mobility  Comments: Mod Ind amb community distances with a RW, does own grocery shopping pushing a cart, no fall history ADLs Comments: Ind with ADLs, niece helps with cleaning PRN    OT Problem List:     OT Treatment/Interventions:        OT Goals(Current goals can be found in the care plan section)   Acute Rehab OT Goals Patient Stated Goal: Return home OT Goal Formulation: With patient Time For Goal Achievement: 09/08/24 Potential to Achieve Goals: Good   OT Frequency:       Co-evaluation              AM-PAC OT 6 Clicks Daily Activity     Outcome Measure Help from another person eating meals?: None Help from another person taking care of personal grooming?: None Help from another person toileting, which includes using toliet, bedpan, or urinal?: None Help from another person bathing (including washing, rinsing, drying)?: None Help from another person to put on and taking off regular upper body clothing?: None Help from another person to put on and taking off regular lower body clothing?: None 6 Click Score: 24   End of Session Equipment Utilized During Treatment: Rolling walker (2 wheels) Nurse Communication: Mobility status  Activity Tolerance: Patient tolerated treatment well Patient left: in chair;with call bell/phone within reach                   Time: 1415-1436 OT Time Calculation (min): 21 min Charges:  OT General Charges $OT Visit: 1 Visit OT Evaluation $OT Eval Low Complexity: 1 Low OT Treatments $Self Care/Home Management : 8-22 mins  Larraine Colas M.S. OTR/L  08/25/24, 3:00 PM

## 2024-08-26 DIAGNOSIS — K529 Noninfective gastroenteritis and colitis, unspecified: Secondary | ICD-10-CM | POA: Diagnosis not present

## 2024-08-26 LAB — CBC
HCT: 34.3 % — ABNORMAL LOW (ref 36.0–46.0)
Hemoglobin: 11.3 g/dL — ABNORMAL LOW (ref 12.0–15.0)
MCH: 29.4 pg (ref 26.0–34.0)
MCHC: 32.9 g/dL (ref 30.0–36.0)
MCV: 89.3 fL (ref 80.0–100.0)
Platelets: 156 K/uL (ref 150–400)
RBC: 3.84 MIL/uL — ABNORMAL LOW (ref 3.87–5.11)
RDW: 16.8 % — ABNORMAL HIGH (ref 11.5–15.5)
WBC: 6 K/uL (ref 4.0–10.5)
nRBC: 0 % (ref 0.0–0.2)

## 2024-08-26 LAB — BASIC METABOLIC PANEL WITH GFR
Anion gap: 12 (ref 5–15)
BUN: 39 mg/dL — ABNORMAL HIGH (ref 8–23)
CO2: 27 mmol/L (ref 22–32)
Calcium: 9.7 mg/dL (ref 8.9–10.3)
Chloride: 99 mmol/L (ref 98–111)
Creatinine, Ser: 2.75 mg/dL — ABNORMAL HIGH (ref 0.44–1.00)
GFR, Estimated: 17 mL/min — ABNORMAL LOW (ref >60)
Glucose, Bld: 97 mg/dL (ref 70–99)
Potassium: 4.4 mmol/L (ref 3.5–5.1)
Sodium: 138 mmol/L (ref 135–145)

## 2024-08-26 LAB — GLUCOSE, CAPILLARY
Glucose-Capillary: 105 mg/dL — ABNORMAL HIGH (ref 70–99)
Glucose-Capillary: 97 mg/dL (ref 70–99)
Glucose-Capillary: 150 mg/dL — ABNORMAL HIGH (ref 70–99)

## 2024-08-26 LAB — MAGNESIUM: Magnesium: 2.1 mg/dL (ref 1.7–2.4)

## 2024-08-26 LAB — PHOSPHORUS: Phosphorus: 3.3 mg/dL (ref 2.5–4.6)

## 2024-08-26 NOTE — Progress Notes (Signed)
 [facility] Progress Note  Patient: [name]    FMW:[id number]  DOA: 08/23/2024     Date of Service: the patient was seen and examined on 08/26/2024  Chief Complaint  Patient presents with   Emesis   Brief hospital course:  [name] is a 83 y.o. year old female with medical history of HTN, HLD, T2DM, ESRD on HD presenting to the ED with nausea, vomiting and diarrhea.     ED workup: HDS stable.  CBC: without leukocytosis  CMP: without any acute electrolyte abnormalities but chronic ESRD changes.   Lipase elevated at 421.   Magnesium  low at 1.6.   CT abdomen pelvis without acute findings but did show hepatomegaly with lobular liver contours.   TRH consulted for admission.    Assessment and Plan:  # Acute gastroenteritis secondary to Norovirus infection and C. Difficile C. difficile antigen positive and toxin PCR positive Continue vancomycin  125 mg p.o. Q6 hourly x 10 days Continue probiotics twice daily Continue oral hydration  # Hypomagnesemia, mag repleted.  Resolved Monitor electrolytes daily and replete as needed.  # ESRD on hemodialysis Nephrology consulted Patient will get hemodialysis tomorrow a.m.  # HTN Resumed amlodipine , hydralazine  and metoprolol  home dose Monitor BP and titrate medications accordingly  # Paroxysmal A-fib/SVTs: Continue Eliquis  home dose Resumed Lopressor  12.5 mg p.o. twice daily  # History of prediabetes in the past  Patient is not on any medication at home Monitor CBG   # COPD, continued inhalers. # Hypothyroid: Synthroid  # Depression: Zoloft  # Gout, resume allopurinol  on discharge   Body mass index is 29.8 kg/m.  Interventions:  Diet: Soft diet DVT Prophylaxis: Eliquis   Advance goals of care discussion: Full code  Family Communication: family was not present at bedside, at the time of interview.  The pt provided permission to discuss medical plan with the family. Opportunity was given to ask question and  all questions were answered satisfactorily.   Disposition:  Pt is from Home, admitted with acute gastroenteritis, still has significant diarrhea, which precludes a safe discharge. Discharge to home, when stable, most likely tomorrow a.m. if remains stable  Subjective: No significant events overnight except that she had excessive amount of urination and frequently going to the bathroom at each time she was having BM small amount as well, she could not sleep well.  Patient was seen after hemodialysis, she does not feel good to go home.  So we will continue to monitor today and plan for discharge tomorrow a.m.     Physical Exam: General: NAD, lying comfortably Appear in no distress, affect appropriate Eyes: PERRLA ENT: Oral Mucosa Clear, moist  Neck: no JVD,  Cardiovascular: S1 and S2 Present, no Murmur,  Respiratory: good respiratory effort, Bilateral Air entry equal and Decreased, no Crackles, no wheezes Abdomen: Bowel Sound present, Soft and no tenderness,  Skin: no rashes Extremities: no Pedal edema, no calf tenderness Neurologic: without any new focal findings Gait not checked due to patient safety concerns  Vitals:   08/26/24 1030 08/26/24 1100 08/26/24 1121 08/26/24 1216  BP: (!) 135/46 (!) 123/47 131/64 (!) 123/49  Pulse: 64 64 65 64  Resp: 15 19 12 16   Temp:   97.9 F (36.6 C) 98.7 F (37.1 C)  TempSrc:   Oral Oral  SpO2: 100% 99% 98% 97%  Weight:   73.9 kg   Height:        Intake/Output Summary (Last 24 hours) at 08/26/2024 1613 Last data filed at 08/26/2024  1121 Gross per 24 hour  Intake --  Output 1000 ml  Net -1000 ml   Filed Weights   08/25/24 1353 08/26/24 0815 08/26/24 1121  Weight: 76.4 kg 74.9 kg 73.9 kg    Data Reviewed: I have personally reviewed and interpreted daily labs, tele strips, imagings as discussed above. I reviewed all nursing notes, pharmacy notes, vitals, pertinent old records I have discussed plan of care as described above with RN  and patient/family.  CBC: Recent Labs  Lab 08/23/24 1227 08/24/24 0443 08/25/24 0533 08/26/24 0651  WBC 10.4 5.7 5.7 6.0  HGB 12.4 10.6* 11.0* 11.3*  HCT 39.4 33.6* 34.5* 34.3*  MCV 92.1 91.6 91.0 89.3  PLT 192 143* 170 156   Basic Metabolic Panel: Recent Labs  Lab 08/23/24 1227 08/24/24 0443 08/25/24 0533 08/26/24 0651  NA 139 137 136 138  K 4.3 3.7 4.4 4.4  CL 98 99 100 99  CO2 25 27 25 27   GLUCOSE 139* 90 112* 97  BUN 32* 32* 52* 39*  CREATININE 3.01* 3.28* 3.59* 2.75*  CALCIUM  10.0 9.3 9.6 9.7  MG 1.6* 1.5* 2.2 2.1  PHOS  --  3.6 3.7 3.3    Studies: No results found.  Scheduled Meds:  amLODipine   5 mg Oral Daily   apixaban   2.5 mg Oral BID   Chlorhexidine  Gluconate Cloth  6 each Topical Daily   fluticasone  furoate-vilanterol  1 puff Inhalation Daily   hydrALAZINE   25 mg Oral Q8H   levothyroxine   150 mcg Oral Q0600   metoprolol  tartrate  12.5 mg Oral BID   pantoprazole   40 mg Oral Daily   rosuvastatin   10 mg Oral QPM   saccharomyces boulardii  250 mg Oral BID   sertraline   100 mg Oral Daily   sodium chloride  flush  3 mL Intravenous Q12H   vancomycin   125 mg Oral QID   Continuous Infusions:   PRN Meds: acetaminophen  **OR** acetaminophen , ipratropium-albuterol , ondansetron  (ZOFRAN ) IV  Time spent: 35 minutes  Author: ELVAN SOR. MD [facility] 08/26/2024 4:13 PM  To reach On-call, see care teams to locate the attending and reach out to them via [email or web address]. If 7PM-7AM, please contact night-coverage If you still have difficulty reaching the attending provider, please page the [facility] (Director on Call) for [facility] on amion for assistance.

## 2024-08-26 NOTE — Plan of Care (Signed)
  Problem: Health Behavior/Discharge Planning: Goal: Ability to manage health-related needs will improve Outcome: Progressing   Problem: Clinical Measurements: Goal: Will remain free from infection Outcome: Progressing   Problem: Activity: Goal: Risk for activity intolerance will decrease Outcome: Progressing   Problem: Elimination: Goal: Will not experience complications related to urinary retention Outcome: Progressing

## 2024-08-26 NOTE — Progress Notes (Signed)
 Hemodialysis Note:  Received patient in bed to unit. Alert and oriented. Informed consent singed and in chart.  Treatment initiated: 0833 Treatment completed: 1121  Access used: Left AVF Access issues: Blood flow rate decreased to 200-250 due to high venous pressure  Patient requested to cut her treatment into 2 hours instead of 3.5 Hours. Patient signed AMA form. HD NP made aware. Transported back to room, alert without acute distress. Report given to patient's RN.  Total UF removed: 1 liter Medications given: None  Post HD weight: 73.9 kg  [name]

## 2024-08-26 NOTE — Progress Notes (Signed)
 [facility]  ROUNDING NOTE   Subjective:  Patient is known to our practice and receives outpatient dialysis treatments at [facility], on a MWF schedule, supervised by Dr Marcelino.    Patient seen and evaluated during dialysis   HEMODIALYSIS FLOWSHEET:  Blood Flow Rate (mL/min): 249 mL/min Arterial Pressure (mmHg): -104.64 mmHg Venous Pressure (mmHg): 287.26 mmHg TMP (mmHg): 8.28 mmHg Ultrafiltration Rate (mL/min): 924 mL/min Dialysate Flow Rate (mL/min): 300 ml/min Dialysis Fluid Bolus: Normal Saline Bolus Amount (mL): 100 mL  Initially tolerating treatment well Complains of weakness Requesting decreased treatment time   Objective:  Vital signs in last 24 hours:  Temp:  [97.9 F (36.6 C)-98.4 F (36.9 C)] 98 F (36.7 C) (11/25 0815) Pulse Rate:  [54-73] 64 (11/25 1000) Resp:  [14-27] 24 (11/25 1000) BP: (117-149)/(42-69) 122/45 (11/25 1000) SpO2:  [96 %-100 %] 99 % (11/25 1000) FiO2 (%):  [28 %] 28 % (11/24 2138) Weight:  [74.9 kg-76.4 kg] 74.9 kg (11/25 0815)  Weight change: -1.3 kg Filed Weights   08/25/24 0700 08/25/24 1353 08/26/24 0815  Weight: 77.6 kg 76.4 kg 74.9 kg    Intake/Output: I/O last 3 completed shifts: In: 240 [P.O.:240] Out: 0.6 [Other:0.6]   Intake/Output this shift:  No intake/output data recorded.  Physical Exam: General: NAD  Head: Normocephalic  Eyes: Anicteric  Lungs:  Clear to auscultation  Heart: Regular rate  Abdomen:  Soft, nontender  Extremities:  No peripheral edema.  Neurologic: Alert, conversant, pleasant  Skin: warm  Access: Left AVF in use +thrill/+bruit, Left permcath    Basic Metabolic Panel: Recent Labs  Lab 08/23/24 1227 08/24/24 0443 08/25/24 0533 08/26/24 0651  NA 139 137 136 138  K 4.3 3.7 4.4 4.4  CL 98 99 100 99  CO2 25 27 25 27   GLUCOSE 139* 90 112* 97  BUN 32* 32* 52* 39*  CREATININE 3.01* 3.28* 3.59* 2.75*  CALCIUM  10.0 9.3 9.6 9.7  MG 1.6* 1.5* 2.2 2.1  PHOS  --  3.6 3.7  3.3    Liver Function Tests: Recent Labs  Lab 08/23/24 1227  AST 33  ALT 21  ALKPHOS 118  BILITOT 0.7  PROT 7.7  ALBUMIN 4.6   Recent Labs  Lab 08/23/24 1227 08/24/24 0443  LIPASE 421* 24   No results for input(s): AMMONIA in the last 168 hours.  CBC: Recent Labs  Lab 08/23/24 1227 08/24/24 0443 08/25/24 0533 08/26/24 0651  WBC 10.4 5.7 5.7 6.0  HGB 12.4 10.6* 11.0* 11.3*  HCT 39.4 33.6* 34.5* 34.3*  MCV 92.1 91.6 91.0 89.3  PLT 192 143* 170 156    Cardiac Enzymes: No results for input(s): CKTOTAL, CKMB, CKMBINDEX, TROPONINI in the last 168 hours.  BNP: Invalid input(s): POCBNP  CBG: Recent Labs  Lab 08/24/24 0929 08/25/24 1713 08/26/24 0107 08/26/24 0721 08/26/24 0956  GLUCAP 89 101* 105* 97 150*    Microbiology: Results for orders placed or performed during the hospital encounter of 08/23/24  Gastrointestinal Panel by PCR , Stool     Status: Abnormal   Collection Time: 08/23/24  4:59 PM   Specimen: Stool  Result Value Ref Range Status   Campylobacter species NOT DETECTED NOT DETECTED Final   Plesimonas shigelloides NOT DETECTED NOT DETECTED Final   Salmonella species NOT DETECTED NOT DETECTED Final   Yersinia enterocolitica NOT DETECTED NOT DETECTED Final   Vibrio species NOT DETECTED NOT DETECTED Final   Vibrio cholerae NOT DETECTED NOT DETECTED Final   Enteroaggregative E coli (  EAEC) NOT DETECTED NOT DETECTED Final   Enteropathogenic E coli (EPEC) NOT DETECTED NOT DETECTED Final   Enterotoxigenic E coli (ETEC) NOT DETECTED NOT DETECTED Final   Shiga like toxin producing E coli (STEC) NOT DETECTED NOT DETECTED Final   Shigella/Enteroinvasive E coli (EIEC) NOT DETECTED NOT DETECTED Final   Cryptosporidium NOT DETECTED NOT DETECTED Final   Cyclospora cayetanensis NOT DETECTED NOT DETECTED Final   Entamoeba histolytica NOT DETECTED NOT DETECTED Final   Giardia lamblia NOT DETECTED NOT DETECTED Final   Adenovirus F40/41 NOT DETECTED  NOT DETECTED Final   Astrovirus NOT DETECTED NOT DETECTED Final   Norovirus GI/GII DETECTED (A) NOT DETECTED Final    Comment: RESULT CALLED TO, READ BACK BY AND VERIFIED WITH: LEVI ELLIOT, RN @2029  08/23/2024 COP    Rotavirus A NOT DETECTED NOT DETECTED Final   Sapovirus (I, II, IV, and V) NOT DETECTED NOT DETECTED Final    Comment: Performed at [facility], [address] [place], [place] [address]  C Difficile Quick Screen w PCR reflex     Status: Abnormal   Collection Time: 08/23/24  4:59 PM   Specimen: Stool  Result Value Ref Range Status   C Diff antigen POSITIVE (A) NEGATIVE Final   C Diff toxin NEGATIVE NEGATIVE Final   C Diff interpretation Results are indeterminate. See PCR results.  Final    Comment: Performed at [facility], [address] [place], [place] [address]  C. Diff by PCR, Reflexed     Status: Abnormal   Collection Time: 08/23/24  4:59 PM  Result Value Ref Range Status   Toxigenic C. Difficile by PCR POSITIVE (A) NEGATIVE Final    Comment: Positive for toxigenic C. difficile with little to no toxin production. Only treat if clinical presentation suggests symptomatic illness.   Hypervirulent Strain PRESUMPTIVE NEGATIVE PRESUMPTIVE NEGATIVE Final    Comment: Performed at [facility], [address] [place], [place] [address]    Coagulation Studies: No results for input(s): LABPROT, INR in the last 72 hours.  Urinalysis: Recent Labs    08/23/24 1859  COLORURINE YELLOW*  LABSPEC 1.013  PHURINE 7.0  GLUCOSEU NEGATIVE  HGBUR NEGATIVE  BILIRUBINUR NEGATIVE  KETONESUR NEGATIVE  PROTEINUR 100*  NITRITE NEGATIVE  LEUKOCYTESUR SMALL*      Imaging: No results found.    Medications:      amLODipine   5 mg Oral Daily   apixaban   2.5 mg Oral BID   Chlorhexidine  Gluconate Cloth  6 each Topical Daily   fluticasone  furoate-vilanterol  1 puff Inhalation Daily   hydrALAZINE   25 mg Oral Q8H   ipratropium-albuterol   3  mL Nebulization BID   levothyroxine   150 mcg Oral Q0600   metoprolol  tartrate  12.5 mg Oral BID   pantoprazole   40 mg Oral Daily   rosuvastatin   10 mg Oral QPM   saccharomyces boulardii  250 mg Oral BID   sertraline   100 mg Oral Daily   sodium chloride  flush  3 mL Intravenous Q12H   vancomycin   125 mg Oral QID   acetaminophen  **OR** acetaminophen , ipratropium-albuterol , ondansetron  (ZOFRAN ) IV  Assessment/ Plan:  [name] is a 83 y.o.  female with past medical conditions including diastolic heart failure, hypertension, hypothyroidism, COPD, and end stage renal disease on hemodialysis. Patient presents to the emergency department complaining nausea, vomiting and diarrhea, found to have elevated lipase and positive GI panel for norovirus.  [facility]/MWF /76kg   Acute gastroenteritis  Patient GI panel positive for Norovirus. Lipase 421 in the ED. Symptoms have resolved with supportive care.   2.  ESRD on hemodialysis.    Patient requesting 2 hour dialysis treatment however discussed with patient that her next treatment isn't scheduled until Friday. Patient agreeable to complete a 3 hour treatment.   3. Anemia of chronic kidney disease  Hgb 11.3 Patient receives Mircera outpatient.    4.  Hypertension with chronic kidney disease.  Home regimen includes amlodipine , furosemide , and hydralazine . Currently not on any. Blood pressure 121/54 during dialysis  5. Secondary Hyperparathyroidism  Outpatient labs PTH 130, Pho 4.3, Corrected Ca 8.6 as of 08/11/24.  Calcium  9.7 with phos 3.3 today.    LOS: 0 Jazmina Muhlenkamp 11/25/202510:07 AM

## 2024-08-27 ENCOUNTER — Other Ambulatory Visit: Payer: Self-pay

## 2024-08-27 DIAGNOSIS — K529 Noninfective gastroenteritis and colitis, unspecified: Secondary | ICD-10-CM | POA: Diagnosis not present

## 2024-08-27 LAB — CBC
HCT: 32.5 % — ABNORMAL LOW (ref 36.0–46.0)
Hemoglobin: 10.7 g/dL — ABNORMAL LOW (ref 12.0–15.0)
MCH: 28.8 pg (ref 26.0–34.0)
MCHC: 32.9 g/dL (ref 30.0–36.0)
MCV: 87.6 fL (ref 80.0–100.0)
Platelets: 146 K/uL — ABNORMAL LOW (ref 150–400)
RBC: 3.71 MIL/uL — ABNORMAL LOW (ref 3.87–5.11)
RDW: 16.4 % — ABNORMAL HIGH (ref 11.5–15.5)
WBC: 5.4 K/uL (ref 4.0–10.5)
nRBC: 0 % (ref 0.0–0.2)

## 2024-08-27 LAB — BASIC METABOLIC PANEL WITH GFR
Anion gap: 13 (ref 5–15)
BUN: 44 mg/dL — ABNORMAL HIGH (ref 8–23)
CO2: 24 mmol/L (ref 22–32)
Calcium: 9.5 mg/dL (ref 8.9–10.3)
Chloride: 96 mmol/L — ABNORMAL LOW (ref 98–111)
Creatinine, Ser: 2.88 mg/dL — ABNORMAL HIGH (ref 0.44–1.00)
GFR, Estimated: 16 mL/min — ABNORMAL LOW (ref >60)
Glucose, Bld: 94 mg/dL (ref 70–99)
Potassium: 4 mmol/L (ref 3.5–5.1)
Sodium: 133 mmol/L — ABNORMAL LOW (ref 135–145)

## 2024-08-27 LAB — HEPATITIS B SURFACE ANTIBODY, QUANTITATIVE: Hep B S AB Quant (Post): <3.5 m[IU]/mL — ABNORMAL LOW

## 2024-08-27 LAB — MAGNESIUM: Magnesium: 1.7 mg/dL (ref 1.7–2.4)

## 2024-08-27 LAB — GLUCOSE, CAPILLARY
Glucose-Capillary: 103 mg/dL — ABNORMAL HIGH (ref 70–99)
Glucose-Capillary: 107 mg/dL — ABNORMAL HIGH (ref 70–99)

## 2024-08-27 LAB — PHOSPHORUS: Phosphorus: 3.4 mg/dL (ref 2.5–4.6)

## 2024-08-27 MED ORDER — VANCOMYCIN HCL 125 MG PO CAPS
125.0000 mg | ORAL_CAPSULE | Freq: Four times a day (QID) | ORAL | 0 refills | Status: AC
  Filled 2024-08-27: qty 28, 7d supply, fill #0

## 2024-08-27 NOTE — Progress Notes (Addendum)
 Contacted attending to inquire about pt's possible d/c date. Pt to d/c today per MD. [facility] and spoke to [employer]. Clinic staff advised pt will d/c today and should resume care on Friday. Will fax d/c summary and last renal note to clinic for continuation of care once d/c summary is available.   Randine Mungo Dialysis Navigator [phone]  Addendum at 3:34 pm: D/C summary and today's renal note faxed to clinic for continuation of care.

## 2024-08-27 NOTE — TOC CM/SW Note (Signed)
 Transition of Care [facility]) - Inpatient Brief Assessment   Patient Details  Name: [name] MRN: [id number] Date of Birth: 1941-03-13  Transition of Care [facility]) CM/SW Contact:    Corean ONEIDA Haddock, RN Phone Number: 08/27/2024, 12:53 PM   Clinical Narrative:    Transition of Care (TOC) Screening Note   Patient Details  Name: [name] Date of Birth: 22-Jul-1941   Transition of Care [facility]) CM/SW Contact:    Corean ONEIDA Haddock, RN Phone Number: 08/27/2024, 12:53 PM    Transition of Care Department [facility]) has reviewed patient and no TOC needs have been identified at this time. . If new patient transition needs arise, please place a TOC consult.  Consult noted for [facility] needs.  No RN needs notified for dc OT says not follow up, and PT signed off due to no needs  Transition of Care Asessment: Insurance and Status: Insurance coverage has been reviewed Patient has primary care physician: Yes     Prior/Current Home Services: No current home services Social Drivers of Health Review: SDOH reviewed no interventions necessary Readmission risk has been reviewed: No (obs status, no score generated) Transition of care needs: no transition of care needs at this time

## 2024-08-27 NOTE — Plan of Care (Signed)

## 2024-08-27 NOTE — Plan of Care (Signed)

## 2024-08-27 NOTE — Discharge Summary (Signed)
 Physician Discharge Summary   Patient: [name] MRN: [id number] DOB: 1941/01/05  Admit date:     08/23/2024  Discharge date: 08/27/24  Discharge Physician: Laree Lock   PCP: Ostwalt, Janna, PA-C   Recommendations at discharge:   Follow-up with PCP within 1 week -repeat CBC, BMP, mag  Follow-up outpatient for ongoing hemodialysis  Discharge Diagnoses: Principal Problem:   Acute gastroenteritis Active Problems:   Hypomagnesemia   ESRD on dialysis (HCC)   OSA (obstructive sleep apnea)   COPD (chronic obstructive pulmonary disease) (HCC)   Hyperlipidemia   Type 2 diabetes mellitus in patient with obesity (HCC)   GERD without esophagitis   Hospital Course: [name] is a 83 y.o. year old female with medical history of HTN, HLD, T2DM, ESRD on HD presenting to the ED with nausea, vomiting and diarrhea.  Admitted for acute gastroenteritis due to norovirus infection and C. difficile infection. Hospital course as below  Acute gastroenteritis secondary to Norovirus infection and C. Difficile C. difficile antigen positive and toxin PCR positive, denies prior C.diff infections CT abdomen pelvis without acute findings but did show hepatomegaly with lobular liver contours Continue vancomycin  125 mg p.o. Q6 x 10 days Continue oral hydration, diarrhea resolved   Hypomagnesemia, mag repleted.  Resolved   ESRD on hemodialysis Follow-up for ongoing hemodialysis, next session on Friday   HTN   Paroxysmal A-fib/SVTs: Continue Eliquis , Metoprolol   COPD, continued inhalers. Hypothyroid: Synthroid  Depression: Zoloft  Gout, Allopurinol    Obesity Class I Body mass index is 31.13 kg/m.   Consultants: Nephrology Procedures performed: None  Disposition: Home Diet recommendation:  Discharge Diet Orders (From admission, onward)     Start     Ordered   08/27/24 0000  Diet - low sodium heart healthy        08/27/24 1418           DISCHARGE MEDICATION: Allergies as  of 08/27/2024       Reactions   Ace Inhibitors    Other reaction(s): Unknown   Egg Protein-containing Drug Products Diarrhea   Other    Other reaction(s): Other (See Comments) Eggs   Prednisone     Other reaction(s): Other (See Comments) joint pain   Risedronate    Other reaction(s): Other (See Comments)   Sulfa Antibiotics Itching, Swelling   Other reaction(s): Other (See Comments)   Sulfasalazine Other (See Comments)        Medication List     TAKE these medications    albuterol  108 (90 Base) MCG/ACT inhaler Commonly known as: VENTOLIN  HFA Inhale 2 puffs into the lungs every 6 (six) hours as needed for wheezing or shortness of breath.   allopurinol  100 MG tablet Commonly known as: ZYLOPRIM  Take 1 tablet (100 mg total) by mouth daily.   amLODipine  5 MG tablet Commonly known as: NORVASC  Take 5 mg by mouth daily.   apixaban  2.5 MG Tabs tablet Commonly known as: ELIQUIS  Take 1 tablet (2.5 mg total) by mouth 2 (two) times daily.   Calcium  600 600 MG Tabs tablet Generic drug: calcium  carbonate Take 600 mg by mouth daily.   epoetin  alfa-epbx 4000 UNIT/ML injection Commonly known as: RETACRIT  Inject 1 mL (4,000 Units total) into the vein every Monday, Wednesday, and Friday at 6 PM.   esomeprazole  40 MG capsule Commonly known as: NEXIUM  Take 1 capsule (40 mg total) by mouth daily before breakfast.   ferrous sulfate  325 (65 FE) MG tablet Take 325 mg by mouth daily with breakfast.  fluticasone  furoate-vilanterol 200-25 MCG/ACT Aepb Commonly known as: Breo Ellipta  Inhale 1 puff into the lungs daily.   folic acid  1 MG tablet Commonly known as: FOLVITE  Take 1 tablet (1 mg total) by mouth daily.   hydrALAZINE  25 MG tablet Commonly known as: APRESOLINE  Take 1 tablet (25 mg total) by mouth in the morning and at bedtime.   ipratropium-albuterol  0.5-2.5 (3) MG/3ML Soln Commonly known as: DUONEB INHALE THE CONTENTS OF ONE (1) VIAL VIA NEBULIZATION EVERY 6 HOURS AS  NEEDED FORSHORTNESS OF BREATH   levothyroxine  150 MCG tablet Commonly known as: SYNTHROID  TAKE 1 TABLET BY MOUTH ONCE DAILY. TAKE ON AN EMPTY STOMACH WITH A GLASS OF WATER AT LEAST 30-60 MINUTES BEFORE BREAKFAST   lidocaine -prilocaine  cream Commonly known as: EMLA  as directed. Before dialysis   metoprolol  tartrate 25 MG tablet Commonly known as: LOPRESSOR  Take 0.5 tablets (12.5 mg total) by mouth 2 (two) times daily.   montelukast  10 MG tablet Commonly known as: SINGULAIR  TAKE 1 TABLET BY MOUTH NIGHTLY   nystatin powder Apply 1 Application topically 3 (three) times daily as needed (itching).   rosuvastatin  40 MG tablet Commonly known as: CRESTOR  TAKE ONE TABLET BY MOUTH EVERY EVENING   sertraline  100 MG tablet Commonly known as: ZOLOFT  TAKE 1 TABLET BY MOUTH DAILY   torsemide  20 MG tablet Commonly known as: DEMADEX  TAKE TWO TABLETS BY MOUTH ONCE DAILY   vancomycin  125 MG capsule Commonly known as: VANCOCIN  Take 1 capsule (125 mg total) by mouth 4 (four) times daily for 7 days.        Discharge Exam: Filed Weights   08/26/24 0815 08/26/24 1121 08/27/24 0429  Weight: 74.9 kg 73.9 kg 77.2 kg   General: Appear in no distress, affect appropriate Neck: no JVD Cardiovascular: S1 and S2 Present, no Murmur Respiratory: good respiratory effort, Bilateral Air entry equal and Decreased, no Crackles, no wheezes Abdomen: Bowel Sound present, Soft and no tenderness Skin: no rashes Extremities: no Pedal edema, no calf tenderness Neurologic: without any new focal findings  Condition at discharge: good  The results of significant diagnostics from this hospitalization (including imaging, microbiology, ancillary and laboratory) are listed below for reference.   Imaging Studies: CT ABDOMEN PELVIS WO CONTRAST Result Date: 08/23/2024 CLINICAL DATA:  Bowel obstruction suspected N/V EXAM: CT ABDOMEN AND PELVIS WITHOUT CONTRAST TECHNIQUE: Multidetector CT imaging of the abdomen  and pelvis was performed following the standard protocol without IV contrast. RADIATION DOSE REDUCTION: This exam was performed according to the departmental dose-optimization program which includes automated exposure control, adjustment of the mA and/or kV according to patient size and/or use of iterative reconstruction technique. COMPARISON:  June 10, 2022, December 29, 2016 FINDINGS: Evaluation is limited by lack of IV contrast. Lower chest: RIGHT basilar atelectasis. Mosaic attenuation throughout the lung bases. Hepatobiliary: Status post cholecystectomy. Lobular liver contours and hepatomegaly. Pancreas: No peripancreatic fat stranding. Spleen: Spleen is prominent spanning 12.3 cm. Adrenals/Urinary Tract: Adrenal glands are unremarkable. Mass of the inferior pole of the RIGHT kidney measuring slightly greater than fluid density spanning 7.2 x 7.9 by 6.1 cm, previously characterized as a benign cyst (for which no dedicated imaging follow-up is recommended). Atrophic appearance of bilateral kidneys. No hydronephrosis. No obstructing nephrolithiasis. Bladder is unremarkable. Stomach/Bowel: No evidence of bowel obstruction. Diverticulosis most pronounced in the sigmoid colon. Appendix is surgically absent. Colon is fluid-filled. Stomach is decompressed, limiting evaluation. Small hiatal hernia. Vascular/Lymphatic: Severe atherosclerotic calcifications of the nonaneurysmal abdominal aorta. No new suspicious lymphadenopathy. Reproductive: Status post  hysterectomy. No adnexal masses. Other: No free air or free fluid. Musculoskeletal: Similar since 2018 fat density mass with central coarse calcifications along the RIGHT lesser trochanter, possibly a lipoma versus sequela of prior trauma. Osteopenia. No acute osseous abnormality. IMPRESSION: 1. No evidence of bowel obstruction. 2. Colon is fluid-filled as can be seen in the setting of a diarrheal illness. 3. Hepatomegaly with lobular liver contours. Recommend  correlation with LFTs. Aortic Atherosclerosis (ICD10-I70.0). Electronically Signed   By: Corean Salter M.D.   On: 08/23/2024 14:28    Microbiology: Results for orders placed or performed during the hospital encounter of 08/23/24  Gastrointestinal Panel by PCR , Stool     Status: Abnormal   Collection Time: 08/23/24  4:59 PM   Specimen: Stool  Result Value Ref Range Status   Campylobacter species NOT DETECTED NOT DETECTED Final   Plesimonas shigelloides NOT DETECTED NOT DETECTED Final   Salmonella species NOT DETECTED NOT DETECTED Final   Yersinia enterocolitica NOT DETECTED NOT DETECTED Final   Vibrio species NOT DETECTED NOT DETECTED Final   Vibrio cholerae NOT DETECTED NOT DETECTED Final   Enteroaggregative E coli (EAEC) NOT DETECTED NOT DETECTED Final   Enteropathogenic E coli (EPEC) NOT DETECTED NOT DETECTED Final   Enterotoxigenic E coli (ETEC) NOT DETECTED NOT DETECTED Final   Shiga like toxin producing E coli (STEC) NOT DETECTED NOT DETECTED Final   Shigella/Enteroinvasive E coli (EIEC) NOT DETECTED NOT DETECTED Final   Cryptosporidium NOT DETECTED NOT DETECTED Final   Cyclospora cayetanensis NOT DETECTED NOT DETECTED Final   Entamoeba histolytica NOT DETECTED NOT DETECTED Final   Giardia lamblia NOT DETECTED NOT DETECTED Final   Adenovirus F40/41 NOT DETECTED NOT DETECTED Final   Astrovirus NOT DETECTED NOT DETECTED Final   Norovirus GI/GII DETECTED (A) NOT DETECTED Final    Comment: RESULT CALLED TO, READ BACK BY AND VERIFIED WITH: LEVI ELLIOT, RN @2029  08/23/2024 COP    Rotavirus A NOT DETECTED NOT DETECTED Final   Sapovirus (I, II, IV, and V) NOT DETECTED NOT DETECTED Final    Comment: Performed at [facility], [address] [place], [place] [address]  C Difficile Quick Screen w PCR reflex     Status: Abnormal   Collection Time: 08/23/24  4:59 PM   Specimen: Stool  Result Value Ref Range Status   C Diff antigen POSITIVE (A) NEGATIVE Final   C Diff  toxin NEGATIVE NEGATIVE Final   C Diff interpretation Results are indeterminate. See PCR results.  Final    Comment: Performed at [facility], [address] [place], [place] [address]  C. Diff by PCR, Reflexed     Status: Abnormal   Collection Time: 08/23/24  4:59 PM  Result Value Ref Range Status   Toxigenic C. Difficile by PCR POSITIVE (A) NEGATIVE Final    Comment: Positive for toxigenic C. difficile with little to no toxin production. Only treat if clinical presentation suggests symptomatic illness.   Hypervirulent Strain PRESUMPTIVE NEGATIVE PRESUMPTIVE NEGATIVE Final    Comment: Performed at [facility], [address] [place], [place] [address]    Labs: CBC: Recent Labs  Lab 08/23/24 1227 08/24/24 0443 08/25/24 0533 08/26/24 0651 08/27/24 0441  WBC 10.4 5.7 5.7 6.0 5.4  HGB 12.4 10.6* 11.0* 11.3* 10.7*  HCT 39.4 33.6* 34.5* 34.3* 32.5*  MCV 92.1 91.6 91.0 89.3 87.6  PLT 192 143* 170 156 146*   Basic Metabolic Panel: Recent Labs  Lab 08/23/24 1227 08/24/24 0443 08/25/24  0533 08/26/24 0651 08/27/24 0441  NA 139 137 136 138 133*  K 4.3 3.7 4.4 4.4 4.0  CL 98 99 100 99 96*  CO2 25 27 25 27 24   GLUCOSE 139* 90 112* 97 94  BUN 32* 32* 52* 39* 44*  CREATININE 3.01* 3.28* 3.59* 2.75* 2.88*  CALCIUM  10.0 9.3 9.6 9.7 9.5  MG 1.6* 1.5* 2.2 2.1 1.7  PHOS  --  3.6 3.7 3.3 3.4   Liver Function Tests: Recent Labs  Lab 08/23/24 1227  AST 33  ALT 21  ALKPHOS 118  BILITOT 0.7  PROT 7.7  ALBUMIN 4.6   CBG: Recent Labs  Lab 08/26/24 0107 08/26/24 0721 08/26/24 0956 08/26/24 2359 08/27/24 0803  GLUCAP 105* 97 150* 103* 107*    Discharge time spent: greater than 30 minutes.  Signed: Laree Lock, MD [facility] 08/27/2024

## 2024-08-27 NOTE — H&P (View-Only) (Signed)
 [facility]  ROUNDING NOTE   Subjective:  Patient is known to our practice and receives outpatient dialysis treatments at [facility], on a MWF schedule, supervised by Dr Marcelino.    Patient seen sitting at side of bed Room air Denies shortness of breath States she has ambulated in hall   Objective:  Vital signs in last 24 hours:  Temp:  [98 F (36.7 C)-98.7 F (37.1 C)] 98 F (36.7 C) (11/26 0807) Pulse Rate:  [59-67] 62 (11/26 0807) Resp:  [16-18] 18 (11/26 0807) BP: (123-155)/(45-53) 155/51 (11/26 0807) SpO2:  [97 %-99 %] 99 % (11/26 0807) FiO2 (%):  [28 %] 28 % (11/25 2315) Weight:  [77.2 kg] 77.2 kg (11/26 0429)  Weight change: -1.5 kg Filed Weights   08/26/24 0815 08/26/24 1121 08/27/24 0429  Weight: 74.9 kg 73.9 kg 77.2 kg    Intake/Output: I/O last 3 completed shifts: In: -  Out: 1000 [Other:1000]   Intake/Output this shift:  Total I/O In: 240 [P.O.:240] Out: -   Physical Exam: General: NAD  Head: Normocephalic  Eyes: Anicteric  Lungs:  Clear to auscultation, normal effort  Heart: Regular rate  Abdomen:  Soft, nontender  Extremities:  No peripheral edema.  Neurologic: Alert, conversant, pleasant  Skin: warm  Access: Left AVF in use +thrill/+bruit, Left permcath    Basic Metabolic Panel: Recent Labs  Lab 08/23/24 1227 08/24/24 0443 08/25/24 0533 08/26/24 0651 08/27/24 0441  NA 139 137 136 138 133*  K 4.3 3.7 4.4 4.4 4.0  CL 98 99 100 99 96*  CO2 25 27 25 27 24   GLUCOSE 139* 90 112* 97 94  BUN 32* 32* 52* 39* 44*  CREATININE 3.01* 3.28* 3.59* 2.75* 2.88*  CALCIUM  10.0 9.3 9.6 9.7 9.5  MG 1.6* 1.5* 2.2 2.1 1.7  PHOS  --  3.6 3.7 3.3 3.4    Liver Function Tests: Recent Labs  Lab 08/23/24 1227  AST 33  ALT 21  ALKPHOS 118  BILITOT 0.7  PROT 7.7  ALBUMIN 4.6   Recent Labs  Lab 08/23/24 1227 08/24/24 0443  LIPASE 421* 24   No results for input(s): AMMONIA in the last 168 hours.  CBC: Recent Labs  Lab  08/23/24 1227 08/24/24 0443 08/25/24 0533 08/26/24 0651 08/27/24 0441  WBC 10.4 5.7 5.7 6.0 5.4  HGB 12.4 10.6* 11.0* 11.3* 10.7*  HCT 39.4 33.6* 34.5* 34.3* 32.5*  MCV 92.1 91.6 91.0 89.3 87.6  PLT 192 143* 170 156 146*    Cardiac Enzymes: No results for input(s): CKTOTAL, CKMB, CKMBINDEX, TROPONINI in the last 168 hours.  BNP: Invalid input(s): POCBNP  CBG: Recent Labs  Lab 08/26/24 0107 08/26/24 0721 08/26/24 0956 08/26/24 2359 08/27/24 0803  GLUCAP 105* 97 150* 103* 107*    Microbiology: Results for orders placed or performed during the hospital encounter of 08/23/24  Gastrointestinal Panel by PCR , Stool     Status: Abnormal   Collection Time: 08/23/24  4:59 PM   Specimen: Stool  Result Value Ref Range Status   Campylobacter species NOT DETECTED NOT DETECTED Final   Plesimonas shigelloides NOT DETECTED NOT DETECTED Final   Salmonella species NOT DETECTED NOT DETECTED Final   Yersinia enterocolitica NOT DETECTED NOT DETECTED Final   Vibrio species NOT DETECTED NOT DETECTED Final   Vibrio cholerae NOT DETECTED NOT DETECTED Final   Enteroaggregative E coli (EAEC) NOT DETECTED NOT DETECTED Final   Enteropathogenic E coli (EPEC) NOT DETECTED NOT DETECTED Final   Enterotoxigenic E  coli (ETEC) NOT DETECTED NOT DETECTED Final   Shiga like toxin producing E coli (STEC) NOT DETECTED NOT DETECTED Final   Shigella/Enteroinvasive E coli (EIEC) NOT DETECTED NOT DETECTED Final   Cryptosporidium NOT DETECTED NOT DETECTED Final   Cyclospora cayetanensis NOT DETECTED NOT DETECTED Final   Entamoeba histolytica NOT DETECTED NOT DETECTED Final   Giardia lamblia NOT DETECTED NOT DETECTED Final   Adenovirus F40/41 NOT DETECTED NOT DETECTED Final   Astrovirus NOT DETECTED NOT DETECTED Final   Norovirus GI/GII DETECTED (A) NOT DETECTED Final    Comment: RESULT CALLED TO, READ BACK BY AND VERIFIED WITH: LEVI ELLIOT, RN @2029  08/23/2024 COP    Rotavirus A NOT DETECTED NOT  DETECTED Final   Sapovirus (I, II, IV, and V) NOT DETECTED NOT DETECTED Final    Comment: Performed at [facility], [address] [place], [place] [address]  C Difficile Quick Screen w PCR reflex     Status: Abnormal   Collection Time: 08/23/24  4:59 PM   Specimen: Stool  Result Value Ref Range Status   C Diff antigen POSITIVE (A) NEGATIVE Final   C Diff toxin NEGATIVE NEGATIVE Final   C Diff interpretation Results are indeterminate. See PCR results.  Final    Comment: Performed at [facility], [address] [place], [place] [address]  C. Diff by PCR, Reflexed     Status: Abnormal   Collection Time: 08/23/24  4:59 PM  Result Value Ref Range Status   Toxigenic C. Difficile by PCR POSITIVE (A) NEGATIVE Final    Comment: Positive for toxigenic C. difficile with little to no toxin production. Only treat if clinical presentation suggests symptomatic illness.   Hypervirulent Strain PRESUMPTIVE NEGATIVE PRESUMPTIVE NEGATIVE Final    Comment: Performed at [facility], [address] [place], [place] [address]    Coagulation Studies: No results for input(s): LABPROT, INR in the last 72 hours.  Urinalysis: No results for input(s): COLORURINE, LABSPEC, PHURINE, GLUCOSEU, HGBUR, BILIRUBINUR, KETONESUR, PROTEINUR, UROBILINOGEN, NITRITE, LEUKOCYTESUR in the last 72 hours.  Invalid input(s): APPERANCEUR     Imaging: No results found.    Medications:      amLODipine   5 mg Oral Daily   apixaban   2.5 mg Oral BID   Chlorhexidine  Gluconate Cloth  6 each Topical Daily   fluticasone  furoate-vilanterol  1 puff Inhalation Daily   hydrALAZINE   25 mg Oral Q8H   levothyroxine   150 mcg Oral Q0600   metoprolol  tartrate  12.5 mg Oral BID   pantoprazole   40 mg Oral Daily   rosuvastatin   10 mg Oral QPM   saccharomyces boulardii  250 mg Oral BID   sertraline   100 mg Oral Daily   sodium chloride  flush  3 mL Intravenous Q12H    vancomycin   125 mg Oral QID   acetaminophen  **OR** acetaminophen , ipratropium-albuterol , ondansetron  (ZOFRAN ) IV  Assessment/ Plan:  [name] is a 83 y.o.  female with past medical conditions including diastolic heart failure, hypertension, hypothyroidism, COPD, and end stage renal disease on hemodialysis. Patient presents to the emergency department complaining nausea, vomiting and diarrhea, found to have elevated lipase and positive GI panel for norovirus.  [facility]/MWF /76kg    ESRD on hemodialysis.    Patient received dialysis yesterday, completed 3 hour treatment with 1L UF removal. Next treatment scheduled for Friday. Will defer discharge plan to primary team  Acute gastroenteritis   Patient GI panel positive for Norovirus. Lipase 421 in the ED.  Symptoms have resolved   3. Anemia of chronic kidney disease  Hgb 10.7. Patient receives Mircera outpatient.    4.  Hypertension with chronic kidney disease.  Home regimen includes amlodipine , furosemide , and hydralazine . Currently not on any. Blood pressure 155/51  5. Secondary Hyperparathyroidism  Outpatient labs PTH 130, Pho 4.3, Corrected Ca 8.6 as of 08/11/24.      LOS: 0 Ryosuke Ericksen 11/26/202511:33 AM

## 2024-08-27 NOTE — Progress Notes (Signed)
 [facility]  ROUNDING NOTE   Subjective:  Patient is known to our practice and receives outpatient dialysis treatments at [facility], on a MWF schedule, supervised by Dr Marcelino.    Patient seen sitting at side of bed Room air Denies shortness of breath States she has ambulated in hall   Objective:  Vital signs in last 24 hours:  Temp:  [98 F (36.7 C)-98.7 F (37.1 C)] 98 F (36.7 C) (11/26 0807) Pulse Rate:  [59-67] 62 (11/26 0807) Resp:  [16-18] 18 (11/26 0807) BP: (123-155)/(45-53) 155/51 (11/26 0807) SpO2:  [97 %-99 %] 99 % (11/26 0807) FiO2 (%):  [28 %] 28 % (11/25 2315) Weight:  [77.2 kg] 77.2 kg (11/26 0429)  Weight change: -1.5 kg Filed Weights   08/26/24 0815 08/26/24 1121 08/27/24 0429  Weight: 74.9 kg 73.9 kg 77.2 kg    Intake/Output: I/O last 3 completed shifts: In: -  Out: 1000 [Other:1000]   Intake/Output this shift:  Total I/O In: 240 [P.O.:240] Out: -   Physical Exam: General: NAD  Head: Normocephalic  Eyes: Anicteric  Lungs:  Clear to auscultation, normal effort  Heart: Regular rate  Abdomen:  Soft, nontender  Extremities:  No peripheral edema.  Neurologic: Alert, conversant, pleasant  Skin: warm  Access: Left AVF in use +thrill/+bruit, Left permcath    Basic Metabolic Panel: Recent Labs  Lab 08/23/24 1227 08/24/24 0443 08/25/24 0533 08/26/24 0651 08/27/24 0441  NA 139 137 136 138 133*  K 4.3 3.7 4.4 4.4 4.0  CL 98 99 100 99 96*  CO2 25 27 25 27 24   GLUCOSE 139* 90 112* 97 94  BUN 32* 32* 52* 39* 44*  CREATININE 3.01* 3.28* 3.59* 2.75* 2.88*  CALCIUM  10.0 9.3 9.6 9.7 9.5  MG 1.6* 1.5* 2.2 2.1 1.7  PHOS  --  3.6 3.7 3.3 3.4    Liver Function Tests: Recent Labs  Lab 08/23/24 1227  AST 33  ALT 21  ALKPHOS 118  BILITOT 0.7  PROT 7.7  ALBUMIN 4.6   Recent Labs  Lab 08/23/24 1227 08/24/24 0443  LIPASE 421* 24   No results for input(s): AMMONIA in the last 168 hours.  CBC: Recent Labs  Lab  08/23/24 1227 08/24/24 0443 08/25/24 0533 08/26/24 0651 08/27/24 0441  WBC 10.4 5.7 5.7 6.0 5.4  HGB 12.4 10.6* 11.0* 11.3* 10.7*  HCT 39.4 33.6* 34.5* 34.3* 32.5*  MCV 92.1 91.6 91.0 89.3 87.6  PLT 192 143* 170 156 146*    Cardiac Enzymes: No results for input(s): CKTOTAL, CKMB, CKMBINDEX, TROPONINI in the last 168 hours.  BNP: Invalid input(s): POCBNP  CBG: Recent Labs  Lab 08/26/24 0107 08/26/24 0721 08/26/24 0956 08/26/24 2359 08/27/24 0803  GLUCAP 105* 97 150* 103* 107*    Microbiology: Results for orders placed or performed during the hospital encounter of 08/23/24  Gastrointestinal Panel by PCR , Stool     Status: Abnormal   Collection Time: 08/23/24  4:59 PM   Specimen: Stool  Result Value Ref Range Status   Campylobacter species NOT DETECTED NOT DETECTED Final   Plesimonas shigelloides NOT DETECTED NOT DETECTED Final   Salmonella species NOT DETECTED NOT DETECTED Final   Yersinia enterocolitica NOT DETECTED NOT DETECTED Final   Vibrio species NOT DETECTED NOT DETECTED Final   Vibrio cholerae NOT DETECTED NOT DETECTED Final   Enteroaggregative E coli (EAEC) NOT DETECTED NOT DETECTED Final   Enteropathogenic E coli (EPEC) NOT DETECTED NOT DETECTED Final   Enterotoxigenic E  coli (ETEC) NOT DETECTED NOT DETECTED Final   Shiga like toxin producing E coli (STEC) NOT DETECTED NOT DETECTED Final   Shigella/Enteroinvasive E coli (EIEC) NOT DETECTED NOT DETECTED Final   Cryptosporidium NOT DETECTED NOT DETECTED Final   Cyclospora cayetanensis NOT DETECTED NOT DETECTED Final   Entamoeba histolytica NOT DETECTED NOT DETECTED Final   Giardia lamblia NOT DETECTED NOT DETECTED Final   Adenovirus F40/41 NOT DETECTED NOT DETECTED Final   Astrovirus NOT DETECTED NOT DETECTED Final   Norovirus GI/GII DETECTED (A) NOT DETECTED Final    Comment: RESULT CALLED TO, READ BACK BY AND VERIFIED WITH: LEVI ELLIOT, RN @2029  08/23/2024 COP    Rotavirus A NOT DETECTED NOT  DETECTED Final   Sapovirus (I, II, IV, and V) NOT DETECTED NOT DETECTED Final    Comment: Performed at [facility], [address] [place], [place] [address]  C Difficile Quick Screen w PCR reflex     Status: Abnormal   Collection Time: 08/23/24  4:59 PM   Specimen: Stool  Result Value Ref Range Status   C Diff antigen POSITIVE (A) NEGATIVE Final   C Diff toxin NEGATIVE NEGATIVE Final   C Diff interpretation Results are indeterminate. See PCR results.  Final    Comment: Performed at [facility], [address] [place], [place] [address]  C. Diff by PCR, Reflexed     Status: Abnormal   Collection Time: 08/23/24  4:59 PM  Result Value Ref Range Status   Toxigenic C. Difficile by PCR POSITIVE (A) NEGATIVE Final    Comment: Positive for toxigenic C. difficile with little to no toxin production. Only treat if clinical presentation suggests symptomatic illness.   Hypervirulent Strain PRESUMPTIVE NEGATIVE PRESUMPTIVE NEGATIVE Final    Comment: Performed at [facility], [address] [place], [place] [address]    Coagulation Studies: No results for input(s): LABPROT, INR in the last 72 hours.  Urinalysis: No results for input(s): COLORURINE, LABSPEC, PHURINE, GLUCOSEU, HGBUR, BILIRUBINUR, KETONESUR, PROTEINUR, UROBILINOGEN, NITRITE, LEUKOCYTESUR in the last 72 hours.  Invalid input(s): APPERANCEUR     Imaging: No results found.    Medications:      amLODipine   5 mg Oral Daily   apixaban   2.5 mg Oral BID   Chlorhexidine  Gluconate Cloth  6 each Topical Daily   fluticasone  furoate-vilanterol  1 puff Inhalation Daily   hydrALAZINE   25 mg Oral Q8H   levothyroxine   150 mcg Oral Q0600   metoprolol  tartrate  12.5 mg Oral BID   pantoprazole   40 mg Oral Daily   rosuvastatin   10 mg Oral QPM   saccharomyces boulardii  250 mg Oral BID   sertraline   100 mg Oral Daily   sodium chloride  flush  3 mL Intravenous Q12H    vancomycin   125 mg Oral QID   acetaminophen  **OR** acetaminophen , ipratropium-albuterol , ondansetron  (ZOFRAN ) IV  Assessment/ Plan:  [name] is a 83 y.o.  female with past medical conditions including diastolic heart failure, hypertension, hypothyroidism, COPD, and end stage renal disease on hemodialysis. Patient presents to the emergency department complaining nausea, vomiting and diarrhea, found to have elevated lipase and positive GI panel for norovirus.  [facility]/MWF /76kg    ESRD on hemodialysis.    Patient received dialysis yesterday, completed 3 hour treatment with 1L UF removal. Next treatment scheduled for Friday. Will defer discharge plan to primary team  Acute gastroenteritis   Patient GI panel positive for Norovirus. Lipase 421 in the ED.  Symptoms have resolved   3. Anemia of chronic kidney disease  Hgb 10.7. Patient receives Mircera outpatient.    4.  Hypertension with chronic kidney disease.  Home regimen includes amlodipine , furosemide , and hydralazine . Currently not on any. Blood pressure 155/51  5. Secondary Hyperparathyroidism  Outpatient labs PTH 130, Pho 4.3, Corrected Ca 8.6 as of 08/11/24.      LOS: 0 Ryosuke Ericksen 11/26/202511:33 AM

## 2024-08-27 NOTE — Progress Notes (Signed)
 Mobility Specialist Progress Note:    08/27/24 0850  Mobility  Activity Ambulated independently  Level of Assistance Independent  Assistive Device Front wheel walker  Distance Ambulated (ft) 500 ft  Range of Motion/Exercises Active;All extremities  Activity Response Tolerated well  Mobility visit 1 Mobility  Mobility Specialist Start Time (ACUTE ONLY) [id number]  Mobility Specialist Stop Time (ACUTE ONLY) 0850  Mobility Specialist Time Calculation (min) (ACUTE ONLY) 14 min   Pt received ambulating, eager for further mobility. Independently able to stand and ambulate with RW. Tolerated well, required one rest break during session. Returned to room, all needs met.  Sherrilee Ditty Mobility Specialist Please contact via Special Educational Needs Teacher or  Rehab office at [phone]

## 2024-09-04 ENCOUNTER — Telehealth (INDEPENDENT_AMBULATORY_CARE_PROVIDER_SITE_OTHER): Payer: Self-pay

## 2024-09-04 NOTE — Telephone Encounter (Signed)
 I attempted to contact the patient to schedule her for a permcath removal after receiving an order through [facility]. A message was left for a return call.

## 2024-09-08 ENCOUNTER — Telehealth (INDEPENDENT_AMBULATORY_CARE_PROVIDER_SITE_OTHER): Payer: Self-pay

## 2024-09-08 NOTE — Telephone Encounter (Signed)
 Heather from [facility] called stating the patient missed the call to schedule her for a permcath removal. Patient was scheduled for 09/18/24 with a 2:00 pm arrival time to the [facility]. Pre-procedure instructions will be faxed to attn Amber at [facility] [address].

## 2024-09-09 ENCOUNTER — Ambulatory Visit (INDEPENDENT_AMBULATORY_CARE_PROVIDER_SITE_OTHER): Admitting: Audiology

## 2024-09-09 ENCOUNTER — Ambulatory Visit (INDEPENDENT_AMBULATORY_CARE_PROVIDER_SITE_OTHER): Admitting: Otolaryngology

## 2024-09-09 NOTE — Progress Notes (Deleted)
  [address], [address] [place], [place] [address] [phone]  Audiological Evaluation    Name: [name]     DOB:   1941/01/06      MRN:   [id number]                                                                                     Service Date: 09/09/2024     Accompanied by: self ***   Patient comes today after {ENT providers:31223} sent a referral for a hearing evaluation due to concerns with {audiology symptoms:31224}.   Symptoms Yes Details  Hearing loss  []    Tinnitus  []    Ear pain/ infections/pressure  []    Balance problems  []    Noise exposure history  []    Previous ear surgeries  []    Family history of hearing loss  []    Amplification  []    Other  []      Otoscopy: Right ear: {otoscopy:31227} Left ear:  {otoscopy:31227}  Tympanometry: Right ear: {tympanometry results:31367} Left ear: {tympanometry results:31367}  ***Distortion Product Otoacoustic Emissions: Frequencies tested 1.6-8kHz - Diagnostic Test (12 frequencies)   Results are considered present if DP-NF is above 6dB SPL and DP is above -10dB SPL. Right ear: {DPOAE's:31368::Present from 1600-8000 Hz which is suggestive of normal outer hair cell function.} Left ear: {DPOAE's:31368::Present from 1600-8000 Hz which is suggestive of normal outer hair cell function.}  Hearing Evaluation The hearing test results were completed {transducer options:31388} and results are deemed to be of {test reliability:31390::good reliability}. Test technique:  {audiometric test technique:31400::conventional}    Pure tone Audiometry: Right ear- *** {hearing loss types:31372::sensorineural hearing loss} from *** Hz - *** Hz. Left ear-  *** {hearing loss types:31372::sensorineural hearing loss} from *** Hz - *** Hz.  Speech Audiometry: Right ear- {AUD SRT/SAT:19348}. Left ear-{AUD SRT/SAT:19348}.   Word Recognition Score Tested using {word lists:31376::NU-6 (recorded)} Right ear: ***% was obtained  at a presentation level of *** dBHL with contralateral masking which is deemed as  {word recognition score:31373}. Left ear: ***% was obtained at a presentation level of *** dBHL with contralateral masking which is deemed as  {word recognition score:31373}.   Impression: {Word recognition Score interpretation:31432::There is not a significant difference in pure-tone thresholds between ears.,There is not a significant difference in the word recognition score in between ears. }   Recommendations: {Audiology Recommendations:31370::Follow up with ENT as scheduled.}   [name] MARIE LEROUX-MARTINEZ, AUD

## 2024-09-11 ENCOUNTER — Ambulatory Visit: Admitting: Dermatology

## 2024-09-11 ENCOUNTER — Encounter: Payer: Self-pay | Admitting: Dermatology

## 2024-09-11 DIAGNOSIS — L821 Other seborrheic keratosis: Secondary | ICD-10-CM

## 2024-09-11 DIAGNOSIS — L814 Other melanin hyperpigmentation: Secondary | ICD-10-CM

## 2024-09-11 DIAGNOSIS — L578 Other skin changes due to chronic exposure to nonionizing radiation: Secondary | ICD-10-CM

## 2024-09-11 DIAGNOSIS — D489 Neoplasm of uncertain behavior, unspecified: Secondary | ICD-10-CM

## 2024-09-11 DIAGNOSIS — C4492 Squamous cell carcinoma of skin, unspecified: Secondary | ICD-10-CM

## 2024-09-11 DIAGNOSIS — C44622 Squamous cell carcinoma of skin of right upper limb, including shoulder: Secondary | ICD-10-CM

## 2024-09-11 HISTORY — DX: Squamous cell carcinoma of skin, unspecified: C44.92

## 2024-09-11 NOTE — Progress Notes (Unsigned)
 Follow-Up Visit   Subjective  [name] is a 83 y.o. female who presents for the following: lesion of concern on her right hand. Hx of BCC and AK's.  The patient has spots, moles and lesions to be evaluated, some may be new or changing and the patient may have concern these could be cancer.  The following portions of the chart were reviewed this encounter and updated as appropriate: medications, allergies, medical history  Review of Systems:  No other skin or systemic complaints except as noted in HPI or Assessment and Plan.  Objective  Well appearing patient in no apparent distress; mood and affect are within normal limits.   A focused examination was performed of the following areas: Right hand  Relevant exam findings are noted in the Assessment and Plan.  Right Dorsal Hand distal 2.2 cm volcano like papule w/central crust   Right Dorsal Hand proximal 1.1 cm volcano like papule w/central crust    Assessment & Plan  NEOPLASM OF UNCERTAIN BEHAVIOR (2) Right Dorsal Hand distal - Epidermal / dermal shaving  Lesion diameter (cm):  2.2 Informed consent: discussed and consent obtained   Patient was prepped and draped in usual sterile fashion: area prepped with alcohol. Anesthesia: the lesion was anesthetized in a standard fashion   Anesthetic:  1% lidocaine  w/ epinephrine  1-100,000 buffered w/ 8.4% NaHCO3 Instrument used: flexible razor blade   Hemostasis achieved with: pressure, aluminum chloride and electrodesiccation   Outcome: patient tolerated procedure well   Post-procedure details: wound care instructions given   Post-procedure details comment:  Ointment and small bandage applied  - Destruction of lesion Complexity: extensive   Destruction method: electrodesiccation and curettage   Informed consent: discussed and consent obtained   Timeout:  patient name, date of birth, surgical site, and procedure verified Procedure prep:  Patient was prepped and draped in  usual sterile fashion Prep type:  Isopropyl alcohol Anesthesia: the lesion was anesthetized in a standard fashion   Anesthetic:  1% lidocaine  w/ epinephrine  1-100,000 buffered w/ 8.4% NaHCO3 Curettage performed in three different directions: Yes   Electrodesiccation performed over the curetted area: Yes   Hemostasis achieved with:  pressure, aluminum chloride and electrodesiccation Outcome: patient tolerated procedure well with no complications   Post-procedure details: sterile dressing applied and wound care instructions given   Dressing type: bandage and petrolatum    Specimen 1 - Surgical pathology Differential Diagnosis: SCC vs Other  Check Margins: No Right Dorsal Hand proximal - Epidermal / dermal shaving  Lesion diameter (cm):  1.1 Informed consent: discussed and consent obtained   Timeout: patient name, date of birth, surgical site, and procedure verified   Procedure prep:  Patient was prepped and draped in usual sterile fashion Prep type:  Isopropyl alcohol Anesthesia: the lesion was anesthetized in a standard fashion   Anesthetic:  1% lidocaine  w/ epinephrine  1-100,000 buffered w/ 8.4% NaHCO3 Instrument used: DermaBlade   Hemostasis achieved with: pressure and aluminum chloride   Outcome: patient tolerated procedure well   Post-procedure details: wound care instructions given    - Destruction of lesion Complexity: extensive   Destruction method: electrodesiccation and curettage   Informed consent: discussed and consent obtained   Timeout:  patient name, date of birth, surgical site, and procedure verified Procedure prep:  Patient was prepped and draped in usual sterile fashion Prep type:  Isopropyl alcohol Anesthesia: the lesion was anesthetized in a standard fashion   Anesthetic:  1% lidocaine  w/ epinephrine  1-100,000 buffered w/ 8.4%  NaHCO3 Curettage performed in three different directions: Yes   Electrodesiccation performed over the curetted area: Yes    Hemostasis achieved with:  pressure, aluminum chloride and electrodesiccation Outcome: patient tolerated procedure well with no complications   Post-procedure details: sterile dressing applied and wound care instructions given   Dressing type: bandage and petrolatum    Specimen 2 - Surgical pathology Differential Diagnosis: SCC vs Other   Check Margins: No SEBORRHEIC KERATOSIS   LENTIGO   ACTINIC SKIN DAMAGE    ACTINIC DAMAGE - chronic, secondary to cumulative UV radiation exposure/sun exposure over time - diffuse scaly erythematous macules with underlying dyspigmentation - Recommend daily broad spectrum sunscreen SPF 30+ to sun-exposed areas, reapply every 2 hours as needed.  - Recommend staying in the shade or wearing long sleeves, sun glasses (UVA+UVB protection) and wide brim hats (4-inch brim around the entire circumference of the hat). - Call for new or changing lesions.  LENTIGINES Exam: scattered tan macules Due to sun exposure Treatment Plan: Benign-appearing, observe. Recommend daily broad spectrum sunscreen SPF 30+ to sun-exposed areas, reapply every 2 hours as needed.  Call for any changes  SEBORRHEIC KERATOSIS - Stuck-on, waxy, tan-brown papules and/or plaques  - Benign-appearing - Discussed benign etiology and prognosis. - Observe - Call for any changes  Return 3-4 months, for TBSE.  I, Emerick Ege, CMA am acting as scribe for Alm Rhyme, MD.   Documentation: I have reviewed the above documentation for accuracy and completeness, and I agree with the above.  Alm Rhyme, MD

## 2024-09-11 NOTE — Patient Instructions (Signed)

## 2024-09-15 ENCOUNTER — Other Ambulatory Visit: Payer: Self-pay | Admitting: Physician Assistant

## 2024-09-15 ENCOUNTER — Ambulatory Visit: Payer: Self-pay | Admitting: Dermatology

## 2024-09-15 LAB — SURGICAL PATHOLOGY

## 2024-09-16 ENCOUNTER — Encounter: Payer: Self-pay | Admitting: Dermatology

## 2024-09-16 NOTE — Telephone Encounter (Signed)
 Advised pt of bx results/sh ?

## 2024-09-16 NOTE — Telephone Encounter (Signed)
-----   Message from Alm Rhyme, MD sent at 09/15/2024  6:09 PM EST ----- FINAL DIAGNOSIS        1. Skin (M), right dorsal hand distal :       SQUAMOUS CELL CARCINOMA, KERATOACANTHOMA TYPE        2. Skin (M), right dorsal hand proximal :       WELL DIFFERENTIATED SQUAMOUS CELL CARCINOMA    1&2 - Both Cancer = SCC Both already treated Recheck next visit

## 2024-09-18 ENCOUNTER — Ambulatory Visit
Admission: RE | Admit: 2024-09-18 | Discharge: 2024-09-18 | Disposition: A | Discharge status: Home / Self Care | Attending: Vascular Surgery | Admitting: Vascular Surgery

## 2024-09-18 ENCOUNTER — Encounter: Admission: RE | Disposition: A | Payer: Self-pay | Attending: Vascular Surgery

## 2024-09-18 DIAGNOSIS — I5032 Chronic diastolic (congestive) heart failure: Secondary | ICD-10-CM | POA: Diagnosis not present

## 2024-09-18 DIAGNOSIS — D631 Anemia in chronic kidney disease: Secondary | ICD-10-CM | POA: Insufficient documentation

## 2024-09-18 DIAGNOSIS — J449 Chronic obstructive pulmonary disease, unspecified: Secondary | ICD-10-CM | POA: Insufficient documentation

## 2024-09-18 DIAGNOSIS — Z992 Dependence on renal dialysis: Secondary | ICD-10-CM | POA: Diagnosis not present

## 2024-09-18 DIAGNOSIS — N2581 Secondary hyperparathyroidism of renal origin: Secondary | ICD-10-CM | POA: Diagnosis not present

## 2024-09-18 DIAGNOSIS — N186 End stage renal disease: Secondary | ICD-10-CM | POA: Insufficient documentation

## 2024-09-18 DIAGNOSIS — E039 Hypothyroidism, unspecified: Secondary | ICD-10-CM | POA: Insufficient documentation

## 2024-09-18 DIAGNOSIS — Z4901 Encounter for fitting and adjustment of extracorporeal dialysis catheter: Secondary | ICD-10-CM | POA: Insufficient documentation

## 2024-09-18 DIAGNOSIS — Z452 Encounter for adjustment and management of vascular access device: Secondary | ICD-10-CM | POA: Diagnosis not present

## 2024-09-18 DIAGNOSIS — Z79899 Other long term (current) drug therapy: Secondary | ICD-10-CM | POA: Insufficient documentation

## 2024-09-18 DIAGNOSIS — I132 Hypertensive heart and chronic kidney disease with heart failure and with stage 5 chronic kidney disease, or end stage renal disease: Secondary | ICD-10-CM | POA: Diagnosis not present

## 2024-09-18 HISTORY — PX: DIALYSIS/PERMA CATHETER REMOVAL: CATH118289

## 2024-09-18 SURGERY — DIALYSIS/PERMA CATHETER REMOVAL
Anesthesia: LOCAL

## 2024-09-18 MED ORDER — LIDOCAINE-EPINEPHRINE (PF) 1 %-1:200000 IJ SOLN
INTRAMUSCULAR | Status: DC | PRN
  Administered 2024-09-18: 14:00:00 20 mL

## 2024-09-18 SURGICAL SUPPLY — 1 items: TRAY LACERATION DISP STRL (SET/KITS/TRAYS/PACK) IMPLANT

## 2024-09-18 NOTE — Op Note (Signed)
 Operative Note     Preoperative diagnosis:   1. ESRD with functional permanent access  Postoperative diagnosis:  1. ESRD with functional permanent access  Procedure:  Removal of left Permcath  Surgeon:  Selinda Gu, MD  Anesthesia:  Local  EBL:  Minimal  Indication for the Procedure:  The patient has a functional permanent dialysis access and no longer needs their permcath.  This can be removed.  Risks and benefits are discussed and informed consent is obtained.  Description of the Procedure:  The patient's left neck, chest and existing catheter were sterilely prepped and draped. The area around the catheter was anesthetized copiously with 1% lidocaine . The catheter was dissected out with curved hemostats until the cuff was freed from the surrounding fibrous sheath. The fiber sheath was transected, and the catheter was then removed in its entirety using gentle traction. Pressure was held and sterile dressings were placed. The patient tolerated the procedure well and was taken to the recovery room in stable condition.     Selinda Gu  09/18/2024, 2:27 PM This note was created with Dragon Medical transcription system. Any errors in dictation are purely unintentional.

## 2024-09-18 NOTE — Discharge Instructions (Signed)
 Tunneled Catheter Removal, Care After Refer to this sheet in the next few weeks. These instructions provide you with information about caring for yourself after your procedure. Your health care provider may also give you more specific instructions. Your treatment has been planned according to current medical practices, but problems sometimes occur. Call your health care provider if you have any problems or questions after your procedure. What can I expect after the procedure? After the procedure, it is common to have: Some mild redness, swelling, and pain around your catheter site.   Follow these instructions at home: Incision care  Check your removal site  every day for signs of infection. Check for: More redness, swelling, or pain. More fluid or blood. Warmth. Pus or a bad smell. Remove your dressing in 48hrs leave open to air  Activity  Return to your normal activities as told by your health care provider. Ask your health care provider what activities are safe for you. Do not lift anything that is heavier than 10 lb (4.5 kg) for 3 days  You may shower tomorrow  Contact a health care provider if: You have more fluid or blood coming from your removal site You have more redness, swelling, or pain at your incisions or around the area where your catheter was removed Your removal site feel warm to the touch. You feel unusually weak. You feel nauseous.. Get help right away if You have swelling in your arm, shoulder, neck, or face. You develop chest pain. You have difficulty breathing. You feel dizzy or light-headed. You have pus or a bad smell coming from your removal site You have a fever. You develop bleeding from your removal site, and your bleeding does not stop. This information is not intended to replace advice given to you by your health care provider. Make sure you discuss any questions you have with your health care provider. Document Released: 09/04/2012 Document Revised:  05/21/2016 Document Reviewed: 06/14/2015 Elsevier Interactive Patient Education  2017 [employer].

## 2024-09-18 NOTE — Interval H&P Note (Signed)
 History and Physical Interval Note:  09/18/2024 2:26 PM  [name]  has presented today for surgery, with the diagnosis of Perma Cath Removal    End Stage Renal.  The various methods of treatment have been discussed with the patient and family. After consideration of risks, benefits and other options for treatment, the patient has consented to  Procedures: DIALYSIS/PERMA CATHETER REMOVAL (N/A) as a surgical intervention.  The patient's history has been reviewed, patient examined, no change in status, stable for surgery.  I have reviewed the patient's chart and labs.  Questions were answered to the patient's satisfaction.     [name]

## 2024-09-19 ENCOUNTER — Encounter: Payer: Self-pay | Admitting: Vascular Surgery

## 2024-09-29 ENCOUNTER — Inpatient Hospital Stay: Admit: 2024-09-29

## 2024-09-29 ENCOUNTER — Observation Stay
Admission: EM | Admit: 2024-09-29 | Discharge: 2024-10-04 | Disposition: A | Discharge status: Home / Self Care | Source: Home / Self Care | Attending: Emergency Medicine | Admitting: Emergency Medicine

## 2024-09-29 ENCOUNTER — Encounter: Payer: Self-pay | Admitting: Internal Medicine

## 2024-09-29 ENCOUNTER — Emergency Department

## 2024-09-29 ENCOUNTER — Other Ambulatory Visit: Payer: Self-pay

## 2024-09-29 DIAGNOSIS — Z992 Dependence on renal dialysis: Secondary | ICD-10-CM | POA: Diagnosis not present

## 2024-09-29 DIAGNOSIS — M109 Gout, unspecified: Secondary | ICD-10-CM | POA: Insufficient documentation

## 2024-09-29 DIAGNOSIS — I132 Hypertensive heart and chronic kidney disease with heart failure and with stage 5 chronic kidney disease, or end stage renal disease: Secondary | ICD-10-CM | POA: Diagnosis not present

## 2024-09-29 DIAGNOSIS — F419 Anxiety disorder, unspecified: Secondary | ICD-10-CM | POA: Insufficient documentation

## 2024-09-29 DIAGNOSIS — E8770 Fluid overload, unspecified: Secondary | ICD-10-CM | POA: Diagnosis not present

## 2024-09-29 DIAGNOSIS — Z6832 Body mass index (BMI) 32.0-32.9, adult: Secondary | ICD-10-CM | POA: Diagnosis not present

## 2024-09-29 DIAGNOSIS — E669 Obesity, unspecified: Secondary | ICD-10-CM | POA: Insufficient documentation

## 2024-09-29 DIAGNOSIS — N186 End stage renal disease: Secondary | ICD-10-CM | POA: Diagnosis not present

## 2024-09-29 DIAGNOSIS — Z79899 Other long term (current) drug therapy: Secondary | ICD-10-CM | POA: Diagnosis not present

## 2024-09-29 DIAGNOSIS — J441 Chronic obstructive pulmonary disease with (acute) exacerbation: Secondary | ICD-10-CM | POA: Insufficient documentation

## 2024-09-29 DIAGNOSIS — I5023 Acute on chronic systolic (congestive) heart failure: Secondary | ICD-10-CM | POA: Diagnosis not present

## 2024-09-29 DIAGNOSIS — R0602 Shortness of breath: Principal | ICD-10-CM

## 2024-09-29 DIAGNOSIS — K219 Gastro-esophageal reflux disease without esophagitis: Secondary | ICD-10-CM

## 2024-09-29 DIAGNOSIS — E039 Hypothyroidism, unspecified: Secondary | ICD-10-CM | POA: Insufficient documentation

## 2024-09-29 DIAGNOSIS — E1122 Type 2 diabetes mellitus with diabetic chronic kidney disease: Secondary | ICD-10-CM | POA: Diagnosis not present

## 2024-09-29 DIAGNOSIS — N390 Urinary tract infection, site not specified: Secondary | ICD-10-CM | POA: Insufficient documentation

## 2024-09-29 DIAGNOSIS — I152 Hypertension secondary to endocrine disorders: Secondary | ICD-10-CM

## 2024-09-29 DIAGNOSIS — E1159 Type 2 diabetes mellitus with other circulatory complications: Secondary | ICD-10-CM

## 2024-09-29 DIAGNOSIS — F32A Depression, unspecified: Secondary | ICD-10-CM | POA: Diagnosis not present

## 2024-09-29 DIAGNOSIS — I480 Paroxysmal atrial fibrillation: Secondary | ICD-10-CM | POA: Insufficient documentation

## 2024-09-29 DIAGNOSIS — I509 Heart failure, unspecified: Secondary | ICD-10-CM

## 2024-09-29 DIAGNOSIS — J810 Acute pulmonary edema: Secondary | ICD-10-CM | POA: Diagnosis not present

## 2024-09-29 DIAGNOSIS — Z7901 Long term (current) use of anticoagulants: Secondary | ICD-10-CM | POA: Insufficient documentation

## 2024-09-29 DIAGNOSIS — I5033 Acute on chronic diastolic (congestive) heart failure: Secondary | ICD-10-CM | POA: Diagnosis not present

## 2024-09-29 DIAGNOSIS — J449 Chronic obstructive pulmonary disease, unspecified: Secondary | ICD-10-CM

## 2024-09-29 DIAGNOSIS — J9601 Acute respiratory failure with hypoxia: Secondary | ICD-10-CM | POA: Diagnosis not present

## 2024-09-29 LAB — BASIC METABOLIC PANEL WITH GFR
Anion gap: 16 — ABNORMAL HIGH (ref 5–15)
BUN: 50 mg/dL — ABNORMAL HIGH (ref 8–23)
CO2: 24 mmol/L (ref 22–32)
Calcium: 9.4 mg/dL (ref 8.9–10.3)
Chloride: 99 mmol/L (ref 98–111)
Creatinine, Ser: 3.23 mg/dL — ABNORMAL HIGH (ref 0.44–1.00)
GFR, Estimated: 14 mL/min — ABNORMAL LOW
Glucose, Bld: 188 mg/dL — ABNORMAL HIGH (ref 70–99)
Potassium: 3.5 mmol/L (ref 3.5–5.1)
Sodium: 140 mmol/L (ref 135–145)

## 2024-09-29 LAB — TROPONIN T, HIGH SENSITIVITY: Troponin T High Sensitivity: 58 ng/L — ABNORMAL HIGH (ref 0–19)

## 2024-09-29 LAB — PRO BRAIN NATRIURETIC PEPTIDE: Pro Brain Natriuretic Peptide: 29321 pg/mL — ABNORMAL HIGH

## 2024-09-29 LAB — CBC
HCT: 36.3 % (ref 36.0–46.0)
Hemoglobin: 11.6 g/dL — ABNORMAL LOW (ref 12.0–15.0)
MCH: 28.9 pg (ref 26.0–34.0)
MCHC: 32 g/dL (ref 30.0–36.0)
MCV: 90.5 fL (ref 80.0–100.0)
Platelets: 146 K/uL — ABNORMAL LOW (ref 150–400)
RBC: 4.01 MIL/uL (ref 3.87–5.11)
RDW: 16.2 % — ABNORMAL HIGH (ref 11.5–15.5)
WBC: 7.1 K/uL (ref 4.0–10.5)
nRBC: 0 % (ref 0.0–0.2)

## 2024-09-29 LAB — SARS CORONAVIRUS 2 BY RT PCR: SARS Coronavirus 2 by RT PCR: NEGATIVE

## 2024-09-29 MED ORDER — MONTELUKAST SODIUM 10 MG PO TABS
10.0000 mg | ORAL_TABLET | Freq: Every day | ORAL | Status: DC
  Administered 2024-09-29 – 2024-10-03 (×5): 10 mg via ORAL
  Filled 2024-09-29 (×5): qty 1

## 2024-09-29 MED ORDER — SODIUM CHLORIDE 0.9 % IV SOLN: 250.0000 mL | INTRAVENOUS | Status: AC | PRN

## 2024-09-29 MED ORDER — IPRATROPIUM-ALBUTEROL 0.5-2.5 (3) MG/3ML IN SOLN
3.0000 mL | Freq: Once | RESPIRATORY_TRACT | Status: AC
  Administered 2024-09-29: 3 mL via RESPIRATORY_TRACT
  Filled 2024-09-29: qty 3

## 2024-09-29 MED ORDER — SODIUM CHLORIDE 0.9% FLUSH
3.0000 mL | Freq: Two times a day (BID) | INTRAVENOUS | Status: DC
  Administered 2024-09-29 – 2024-10-04 (×10): 3 mL via INTRAVENOUS

## 2024-09-29 MED ORDER — LIDOCAINE HCL (PF) 1 % IJ SOLN: 5.0000 mL | INTRAMUSCULAR | Status: DC | PRN

## 2024-09-29 MED ORDER — ALBUTEROL SULFATE HFA 108 (90 BASE) MCG/ACT IN AERS
2.0000 | INHALATION_SPRAY | Freq: Four times a day (QID) | RESPIRATORY_TRACT | Status: DC | PRN
  Administered 2024-09-30 (×2): 2 via RESPIRATORY_TRACT
  Filled 2024-09-29 (×2): qty 6.7

## 2024-09-29 MED ORDER — PENTAFLUOROPROP-TETRAFLUOROETH EX AERO: 1.0000 | INHALATION_SPRAY | CUTANEOUS | Status: DC | PRN

## 2024-09-29 MED ORDER — ACETAMINOPHEN 325 MG PO TABS
650.0000 mg | ORAL_TABLET | ORAL | Status: DC | PRN
  Administered 2024-10-01: 650 mg via ORAL
  Filled 2024-09-29: qty 2

## 2024-09-29 MED ORDER — ZOLPIDEM TARTRATE 5 MG PO TABS
5.0000 mg | ORAL_TABLET | Freq: Every evening | ORAL | Status: DC | PRN
  Administered 2024-10-03: 5 mg via ORAL
  Filled 2024-09-29 (×2): qty 1

## 2024-09-29 MED ORDER — APIXABAN 2.5 MG PO TABS
2.5000 mg | ORAL_TABLET | Freq: Two times a day (BID) | ORAL | Status: DC
  Administered 2024-09-29 – 2024-10-04 (×9): 2.5 mg via ORAL
  Filled 2024-09-29 (×10): qty 1

## 2024-09-29 MED ORDER — HEPARIN SODIUM (PORCINE) 1000 UNIT/ML DIALYSIS: 1000.0000 [IU] | INTRAMUSCULAR | Status: DC | PRN

## 2024-09-29 MED ORDER — ROSUVASTATIN CALCIUM 20 MG PO TABS
40.0000 mg | ORAL_TABLET | Freq: Every evening | ORAL | Status: DC
  Administered 2024-09-30 – 2024-10-03 (×4): 40 mg via ORAL
  Filled 2024-09-29 (×5): qty 2

## 2024-09-29 MED ORDER — METHYLPREDNISOLONE SODIUM SUCC 125 MG IJ SOLR
125.0000 mg | Freq: Once | INTRAMUSCULAR | Status: AC
  Administered 2024-09-29: 125 mg via INTRAVENOUS
  Filled 2024-09-29: qty 2

## 2024-09-29 MED ORDER — ONDANSETRON HCL 4 MG/2ML IJ SOLN: 4.0000 mg | Freq: Four times a day (QID) | INTRAMUSCULAR | Status: DC | PRN

## 2024-09-29 MED ORDER — TORSEMIDE 20 MG PO TABS
40.0000 mg | ORAL_TABLET | Freq: Every day | ORAL | Status: AC
  Administered 2024-09-30 – 2024-10-04 (×4): 40 mg via ORAL
  Filled 2024-09-29 (×5): qty 2

## 2024-09-29 MED ORDER — PANTOPRAZOLE SODIUM 40 MG PO TBEC
40.0000 mg | DELAYED_RELEASE_TABLET | Freq: Every day | ORAL | Status: DC
  Administered 2024-09-30 – 2024-10-04 (×5): 40 mg via ORAL
  Filled 2024-09-29 (×5): qty 1

## 2024-09-29 MED ORDER — CHLORHEXIDINE GLUCONATE CLOTH 2 % EX PADS
6.0000 | MEDICATED_PAD | Freq: Every day | CUTANEOUS | Status: DC
  Administered 2024-10-01 – 2024-10-04 (×3): 6 via TOPICAL

## 2024-09-29 MED ORDER — HYDRALAZINE HCL 50 MG PO TABS
25.0000 mg | ORAL_TABLET | Freq: Three times a day (TID) | ORAL | Status: DC
  Administered 2024-09-29 – 2024-10-02 (×8): 25 mg via ORAL
  Filled 2024-09-29 (×8): qty 1

## 2024-09-29 MED ORDER — LIDOCAINE-PRILOCAINE 2.5-2.5 % EX CREA: 1.0000 | TOPICAL_CREAM | CUTANEOUS | Status: DC | PRN

## 2024-09-29 MED ORDER — METOPROLOL TARTRATE 25 MG PO TABS: 12.5000 mg | ORAL_TABLET | Freq: Two times a day (BID) | ORAL | Status: DC

## 2024-09-29 MED ORDER — ALLOPURINOL 100 MG PO TABS
100.0000 mg | ORAL_TABLET | Freq: Every day | ORAL | Status: DC
  Administered 2024-09-30 – 2024-10-04 (×5): 100 mg via ORAL
  Filled 2024-09-29 (×5): qty 1

## 2024-09-29 MED ORDER — FLUTICASONE FUROATE-VILANTEROL 200-25 MCG/ACT IN AEPB
1.0000 | INHALATION_SPRAY | Freq: Every day | RESPIRATORY_TRACT | Status: DC
  Administered 2024-09-30 – 2024-10-04 (×5): 1 via RESPIRATORY_TRACT
  Filled 2024-09-29 (×2): qty 28

## 2024-09-29 MED ORDER — LEVOTHYROXINE SODIUM 50 MCG PO TABS
150.0000 ug | ORAL_TABLET | Freq: Every day | ORAL | Status: DC
  Administered 2024-09-30 – 2024-10-04 (×5): 150 ug via ORAL
  Filled 2024-09-29 (×5): qty 1

## 2024-09-29 MED ORDER — METHYLPREDNISOLONE SODIUM SUCC 40 MG IJ SOLR
40.0000 mg | Freq: Every day | INTRAMUSCULAR | Status: DC
  Administered 2024-09-30 – 2024-10-02 (×3): 40 mg via INTRAVENOUS
  Filled 2024-09-29 (×3): qty 1

## 2024-09-29 MED ORDER — SERTRALINE HCL 50 MG PO TABS
100.0000 mg | ORAL_TABLET | Freq: Every day | ORAL | Status: DC
  Administered 2024-09-30 – 2024-10-04 (×5): 100 mg via ORAL
  Filled 2024-09-29 (×5): qty 2

## 2024-09-29 MED ORDER — IPRATROPIUM-ALBUTEROL 0.5-2.5 (3) MG/3ML IN SOLN
RESPIRATORY_TRACT | Status: AC
  Filled 2024-09-29: qty 3

## 2024-09-29 MED ORDER — IPRATROPIUM-ALBUTEROL 0.5-2.5 (3) MG/3ML IN SOLN
3.0000 mL | Freq: Four times a day (QID) | RESPIRATORY_TRACT | Status: DC
  Administered 2024-09-29 – 2024-10-01 (×6): 3 mL via RESPIRATORY_TRACT
  Filled 2024-09-29 (×7): qty 3

## 2024-09-29 MED ORDER — INSULIN ASPART 100 UNIT/ML IJ SOLN
0.0000 [IU] | Freq: Three times a day (TID) | INTRAMUSCULAR | Status: AC
  Administered 2024-09-30 (×2): 1 [IU] via SUBCUTANEOUS
  Administered 2024-10-01 – 2024-10-02 (×3): 2 [IU] via SUBCUTANEOUS
  Filled 2024-09-29: qty 1
  Filled 2024-09-29 (×2): qty 2
  Filled 2024-09-29: qty 1
  Filled 2024-09-29: qty 2
  Filled 2024-09-29: qty 0.06

## 2024-09-29 MED ORDER — CARVEDILOL 3.125 MG PO TABS
3.1250 mg | ORAL_TABLET | Freq: Two times a day (BID) | ORAL | Status: AC
  Administered 2024-09-29 – 2024-10-04 (×8): 3.125 mg via ORAL
  Filled 2024-09-29 (×9): qty 1

## 2024-09-29 MED ORDER — FUROSEMIDE 10 MG/ML IJ SOLN: 40.0000 mg | Freq: Once | INTRAMUSCULAR | Status: DC

## 2024-09-29 MED ORDER — SODIUM CHLORIDE 0.9% FLUSH: 3.0000 mL | INTRAVENOUS | Status: DC | PRN

## 2024-09-29 NOTE — H&P (Signed)
 " History and Physical    [name] FMW:[id number] DOB: 1940-10-13 DOA: 09/29/2024  PCP: Ostwalt, Janna, PA-C (Confirm with patient/family/NH records and if not entered, this has to be entered at [facility] point of entry) Patient coming from: Home  I have personally briefly reviewed patient's old medical records in [facility]  Chief Complaint: Cough, SOB  HPI: [name] is a 83 y.o. female with medical history significant of ESRD MWF, HTN/chronic HFpEF, PAF on Eliquis , HLD, IIDM, COPD Gold stage I, obesity, presented with worsening of cough and shortness of breath.  Patient reported that due to holiday season, her HD schedule was adjusted last week.  Instead of doing Monday Wednesday Friday she had class started 2 days of dialysis for 2 times last week.  She completed Monday and Tuesday dialysis and then went to a 3-hour session on Friday and supposed to have another 3 hours on Saturday however Saturday she started to have shortness of breath and lightheadedness during dialysis and could only tolerate less than 1 hour and had to go home.  Over Sunday she started to feel increasing shortness of breath and a dry cough.  She denies any chest pain no fever or chills.  She did not notice any ankle edema.  Patient did 3 DuoNeb treatment this morning.  But did not feel better and called EMS.  ED Course: Afebrile, heart rate 74, blood pressure 170/60 O2 saturation 96% on room air.  Chest x-ray showed interstitial edema cardiomegaly, blood work showed BUN 50 creatinine 3.7 glucose 188.  Patient was given IV Solu-Medrol , DuoNeb x 3 in the ED.  Review of Systems: As per HPI otherwise 14 point review of systems negative.    Past Medical History:  Diagnosis Date   Actinic keratosis    Albuminuria    Allergy    Anemia    Arthritis    Asthma    Basal cell carcinoma 04/12/2017   Above right lateral brow. Nodulocystic type. EDC   Cancer (HCC)    skin   Cataract cortical, senile    CHF  (congestive heart failure) (HCC)    COPD (chronic obstructive pulmonary disease) (HCC)    Diabetes mellitus without complication (HCC)    GERD (gastroesophageal reflux disease)    Hemorrhoids    History of kidney stones    Hyperlipidemia    Hypertension    Hypothyroidism    Lyme disease    No kidney function    OSA (obstructive sleep apnea)    Osteoporosis    Osteoporosis    Oxygen deficiency July 2025   Reflux esophagitis    Sleep apnea    Squamous cell carcinoma of skin 09/11/2024   Right Dorsal Hand distal, EDC   Squamous cell carcinoma of skin 09/11/2024   Right Dorsal Hand proximal, EDC   Steatohepatitis    Steatohepatitis     Past Surgical History:  Procedure Laterality Date   A/V FISTULAGRAM N/A 12/31/2023   Procedure: A/V Fistulagram;  Surgeon: Marea Selinda RAMAN, MD;  Location: [facility] INVASIVE CV LAB;  Service: Cardiovascular;  Laterality: N/A;   ABDOMINAL HYSTERECTOMY     APPENDECTOMY     AV FISTULA PLACEMENT Left 05/19/2022   Procedure: ARTERIOVENOUS (AV) FISTULA CREATION ( BRACHIAL CEPHALIC );  Surgeon: Jama Cordella MATSU, MD;  Location: [facility] ORS;  Service: Vascular;  Laterality: Left;   CARDIAC CATHETERIZATION  1980   [facility]   CARDIAC CATHETERIZATION  08/13/2014   [facility]. no significant CAD, normal LVEDP.  CATARACT EXTRACTION     CHOLECYSTECTOMY     COLONOSCOPY     COLONOSCOPY WITH PROPOFOL  N/A 12/07/2016   Procedure: COLONOSCOPY WITH PROPOFOL ;  Surgeon: Gladis RAYMOND Mariner, MD;  Location: [facility] ENDOSCOPY;  Service: Endoscopy;  Laterality: N/A;   DIALYSIS/PERMA CATHETER INSERTION N/A 12/31/2023   Procedure: DIALYSIS/PERMA CATHETER INSERTION;  Surgeon: Marea Selinda RAMAN, MD;  Location: [facility] INVASIVE CV LAB;  Service: Cardiovascular;  Laterality: N/A;   DIALYSIS/PERMA CATHETER REMOVAL N/A 09/18/2024   Procedure: DIALYSIS/PERMA CATHETER REMOVAL;  Surgeon: Marea Selinda RAMAN, MD;  Location: [facility] INVASIVE CV LAB;  Service: Cardiovascular;  Laterality: N/A;   ESOPHAGOGASTRODUODENOSCOPY      ESOPHAGOGASTRODUODENOSCOPY (EGD) WITH PROPOFOL  N/A 12/07/2016   Procedure: ESOPHAGOGASTRODUODENOSCOPY (EGD) WITH PROPOFOL ;  Surgeon: Gladis RAYMOND Mariner, MD;  Location: [facility] ENDOSCOPY;  Service: Endoscopy;  Laterality: N/A;   ESOPHAGOGASTRODUODENOSCOPY (EGD) WITH PROPOFOL  N/A 01/07/2018   Procedure: ESOPHAGOGASTRODUODENOSCOPY (EGD) WITH PROPOFOL ;  Surgeon: Mariner Gladis RAYMOND, MD;  Location: [facility] ENDOSCOPY;  Service: Endoscopy;  Laterality: N/A;   EYE SURGERY     HEMATOMA EVACUATION Right 08/02/2023   Procedure: EVACUATION HEMATOMA;  Surgeon: Jordis Laneta FALCON, MD;  Location: [facility] ORS;  Service: General;  Laterality: Right;   HEMORRHOID SURGERY     PARTIAL HYSTERECTOMY     THYROIDECTOMY N/A 08/25/2019   Procedure: THYROIDECTOMY EXTRACTION OF SUBTOTAL COMPONENT; PARATHYROID  AUTOTRANSPLANT X1;  Surgeon: Marolyn Nest, MD;  Location: [facility] ORS;  Service: General;  Laterality: N/A;  With Nerve Monitoring(RLN)   TONSILLECTOMY       reports that she has never smoked. She has never used smokeless tobacco. She reports that she does not drink alcohol and does not use drugs.  Allergies[1]  Family History  Problem Relation Age of Onset   Cancer Mother        breast   Kidney disease Mother    Breast cancer Mother    Diabetes Mother    Heart disease Mother    Hypertension Mother    Hypertension Father    Heart attack Father    Cancer Father    COPD Father    Arthritis Maternal Grandfather    Kidney disease Maternal Uncle      Prior to Admission medications  Medication Sig Start Date End Date Taking? Authorizing Provider  albuterol  (VENTOLIN  HFA) 108 (90 Base) MCG/ACT inhaler Inhale 2 puffs into the lungs every 6 (six) hours as needed for wheezing or shortness of breath. 11/14/23   Kasa, Kurian, MD  allopurinol  (ZYLOPRIM ) 100 MG tablet Take 1 tablet (100 mg total) by mouth daily. 07/07/24   Ostwalt, Janna, PA-C  amLODipine  (NORVASC ) 5 MG tablet TAKE 1 TABLET BY MOUTH DAILY. 09/15/24   Ostwalt, Janna,  PA-C  apixaban  (ELIQUIS ) 2.5 MG TABS tablet Take 1 tablet (2.5 mg total) by mouth 2 (two) times daily. 07/07/24 07/07/25  Ostwalt, Janna, PA-C  calcium  carbonate (CALCIUM  600) 600 MG TABS tablet Take 600 mg by mouth daily.    [provider]  epoetin  alfa-epbx (RETACRIT ) 4000 UNIT/ML injection Inject 1 mL (4,000 Units total) into the vein every Monday, Wednesday, and Friday at 6 PM. 01/02/24   Awanda City, MD  esomeprazole  (NEXIUM ) 40 MG capsule Take 1 capsule (40 mg total) by mouth daily before breakfast. 07/07/24   Ostwalt, Janna, PA-C  ferrous sulfate  325 (65 FE) MG tablet Take 325 mg by mouth daily with breakfast.    [provider]  fluticasone  furoate-vilanterol (BREO ELLIPTA ) 200-25 MCG/ACT AEPB Inhale 1 puff into the lungs daily. 11/14/23  Kasa, Kurian, MD  folic acid  (FOLVITE ) 1 MG tablet Take 1 tablet (1 mg total) by mouth daily. 01/01/24   Awanda [place], MD  hydrALAZINE  (APRESOLINE ) 25 MG tablet Take 1 tablet (25 mg total) by mouth in the morning and at bedtime. 08/19/24   Ostwalt, Janna, PA-C  ipratropium-albuterol  (DUONEB) 0.5-2.5 (3) MG/3ML SOLN INHALE THE CONTENTS OF ONE (1) VIAL VIA NEBULIZATION EVERY 6 HOURS AS NEEDED FORSHORTNESS OF BREATH 04/23/24   Kasa, Kurian, MD  levothyroxine  (SYNTHROID ) 150 MCG tablet TAKE 1 TABLET BY MOUTH ONCE DAILY. TAKE ON AN EMPTY STOMACH WITH A GLASS OF WATER AT LEAST 30-60 MINUTES BEFORE BREAKFAST 05/29/24   Ostwalt, Janna, PA-C  lidocaine -prilocaine  (EMLA ) cream as directed. Before dialysis 01/02/24   [provider]  metoprolol  tartrate (LOPRESSOR ) 25 MG tablet Take 0.5 tablets (12.5 mg total) by mouth 2 (two) times daily. 08/19/24   Ostwalt, Janna, PA-C  montelukast  (SINGULAIR ) 10 MG tablet TAKE 1 TABLET BY MOUTH NIGHTLY 07/22/24   Ostwalt, Janna, PA-C  nystatin powder Apply 1 Application topically 3 (three) times daily as needed (itching).    [provider]  rosuvastatin  (CRESTOR ) 40 MG tablet TAKE ONE TABLET BY MOUTH EVERY  EVENING 07/03/24   Ostwalt, Janna, PA-C  sertraline  (ZOLOFT ) 100 MG tablet TAKE 1 TABLET BY MOUTH DAILY 08/21/24   Ostwalt, Janna, PA-C  torsemide  (DEMADEX ) 20 MG tablet TAKE TWO TABLETS BY MOUTH ONCE DAILY 05/29/24   Ostwalt, Janna, PA-C    Physical Exam: Vitals:   09/29/24 1004 09/29/24 1030 09/29/24 1130 09/29/24 1200  BP:  (!) 164/58 (!) 170/63 (!) 168/64  Pulse:  78 88 86  Resp:      Temp:      SpO2:  96% 91% 92%  Weight: 79.8 kg     Height: 5' 2 (1.575 m)       Constitutional: NAD, calm, comfortable Vitals:   09/29/24 1004 09/29/24 1030 09/29/24 1130 09/29/24 1200  BP:  (!) 164/58 (!) 170/63 (!) 168/64  Pulse:  78 88 86  Resp:      Temp:      SpO2:  96% 91% 92%  Weight: 79.8 kg     Height: 5' 2 (1.575 m)      Eyes: PERRL, lids and conjunctivae normal ENMT: Mucous membranes are moist. Posterior pharynx clear of any exudate or lesions.Normal dentition.  Neck: normal, supple, no masses, no thyromegaly Respiratory: Diminished breathing sound bilaterally, scattered wheezing, diffuse crackles bilaterally, increasing respiratory effort. No accessory muscle use.  Cardiovascular: Regular rate and rhythm, no murmurs / rubs / gallops.  Trace extremity edema. 2+ pedal pulses. No carotid bruits.  Abdomen: no tenderness, no masses palpated. No hepatosplenomegaly. Bowel sounds positive.  Musculoskeletal: no clubbing / cyanosis. No joint deformity upper and lower extremities. Good ROM, no contractures. Normal muscle tone.  Skin: no rashes, lesions, ulcers. No induration Neurologic: CN 2-12 grossly intact. Sensation intact, DTR normal. Strength 5/5 in all 4.  Psychiatric: Normal judgment and insight. Alert and oriented x 3. Normal mood.     Labs on Admission: I have personally reviewed following labs and imaging studies  CBC: Recent Labs  Lab 09/29/24 1006  WBC 7.1  HGB 11.6*  HCT 36.3  MCV 90.5  PLT 146*   Basic Metabolic Panel: Recent Labs  Lab 09/29/24 1006  NA 140   K 3.5  CL 99  CO2 24  GLUCOSE 188*  BUN 50*  CREATININE 3.23*  CALCIUM  9.4   GFR: Estimated Creatinine Clearance: 12.9  mL/min (A) (by C-G formula based on SCr of 3.23 mg/dL (H)). Liver Function Tests: No results for input(s): AST, ALT, ALKPHOS, BILITOT, PROT, ALBUMIN in the last 168 hours. No results for input(s): LIPASE, AMYLASE in the last 168 hours. No results for input(s): AMMONIA in the last 168 hours. Coagulation Profile: No results for input(s): INR, PROTIME in the last 168 hours. Cardiac Enzymes: No results for input(s): CKTOTAL, CKMB, CKMBINDEX, TROPONINI in the last 168 hours. BNP (last 3 results) Recent Labs    09/29/24 1006  PROBNP 29,321.0*   HbA1C: No results for input(s): HGBA1C in the last 72 hours. CBG: No results for input(s): GLUCAP in the last 168 hours. Lipid Profile: No results for input(s): CHOL, HDL, LDLCALC, TRIG, CHOLHDL, LDLDIRECT in the last 72 hours. Thyroid  Function Tests: No results for input(s): TSH, T4TOTAL, FREET4, T3FREE, THYROIDAB in the last 72 hours. Anemia Panel: No results for input(s): VITAMINB12, FOLATE, FERRITIN, TIBC, IRON, RETICCTPCT in the last 72 hours. Urine analysis:    Component Value Date/Time   COLORURINE YELLOW (A) 08/23/2024 1859   APPEARANCEUR CLOUDY (A) 08/23/2024 1859   APPEARANCEUR Clear 09/17/2018 1028   LABSPEC 1.013 08/23/2024 1859   LABSPEC 1.013 06/20/2012 1508   PHURINE 7.0 08/23/2024 1859   GLUCOSEU NEGATIVE 08/23/2024 1859   GLUCOSEU Negative 06/20/2012 1508   HGBUR NEGATIVE 08/23/2024 1859   BILIRUBINUR NEGATIVE 08/23/2024 1859   BILIRUBINUR Negative 09/17/2018 1028   BILIRUBINUR Negative 06/20/2012 1508   KETONESUR NEGATIVE 08/23/2024 1859   PROTEINUR 100 (A) 08/23/2024 1859   NITRITE NEGATIVE 08/23/2024 1859   LEUKOCYTESUR SMALL (A) 08/23/2024 1859   LEUKOCYTESUR Negative 06/20/2012 1508    Radiological Exams on  Admission: DG Chest 2 View Result Date: 09/29/2024 EXAM: 2 VIEW(S) XRAY OF THE CHEST 09/29/2024 10:27:00 AM COMPARISON: 05/18/2024 CLINICAL HISTORY: sob sob FINDINGS: LINES, TUBES AND DEVICES: Interval removal of left dialysis catheter. LUNGS AND PLEURA: Interstitial prominence throughout the lungs could reflect interstitial edema. No pleural effusion. No pneumothorax. HEART AND MEDIASTINUM: Cardiomegaly. Aortic atherosclerosis. BONES AND SOFT TISSUES: No acute osseous abnormality. IMPRESSION: 1. Cardiomegaly. 2. Interstitial prominence throughout the lungs, which could reflect interstitial edema. Electronically signed by: Franky Crease MD 09/29/2024 12:32 PM EST RP Workstation: [id number]    EKG: Independently reviewed.  Sinus rhythm, LVH, chronic secondary ST changes due to LVH.  Assessment/Plan Principal Problem:   CHF (congestive heart failure) (HCC) Active Problems:   Acute on chronic diastolic CHF (congestive heart failure) (HCC)   Acute respiratory failure with hypoxia (HCC)  (please populate well all problems here in Problem List. (For example, if patient is on BP meds at home and you resume or decide to hold them, it is a problem that needs to be her. Same for CAD, COPD, HLD and so on)  Acute on chronic HFpEF decompensated - Significant symptoms signs of fluid overload - Emergency dialysis this afternoon - Monitor blood pressure response after dialysis to adjust home BP meds.  Continue hydralazine , change metoprolol  to Coreg .  Hold off amlodipine  as she is in CHF.  Acute COPD exacerbation - Continue IV Solu-Medrol  - ICS and LABA - DuoNebs and as needed albuterol  - Incentive spirometry  ESRD on HD Fluid overload - Emergent HD this afternoon  PAF - In sinus rhythm - Continue Eliquis   Obesity - BMI= 32 - Calorie control recommended  Hypothyroidism - Continue Synthroid   Gout - Continue allopurinol   DVT prophylaxis: Eliquis  Code Status: Full code Family Communication:  None at bedside Disposition Plan: Expect less  than 2 midnights.  Stay Consults called: Nephrology Admission status: Telemetry observation   Cort ONEIDA Mana MD [facility] Pager [phone]  09/29/2024, 2:13 PM       [1]  Allergies Allergen Reactions   Ace Inhibitors     Other reaction(s): Unknown   Egg Protein-Containing Drug Products Diarrhea   Other     Other reaction(s): Other (See Comments) Eggs   Prednisone      Other reaction(s): Other (See Comments) joint pain   Risedronate     Other reaction(s): Other (See Comments)   Sulfa Antibiotics Itching and Swelling    Other reaction(s): Other (See Comments)   Sulfasalazine Other (See Comments)   "

## 2024-09-29 NOTE — ED Triage Notes (Signed)
 C/O breathing problems for a couple of days.  Has taken 3 nebs this morning, with improvement of symptoms.     aAOx3. Skin warm and dry. NAD.  Dialysis patient, dialysis M-W-F  VS  165 69 78 98.5% RA ETCO2 25-26  CBG: 205

## 2024-09-29 NOTE — ED Provider Notes (Signed)
 "  [facility] Provider Note    Event Date/Time   First MD Initiated Contact with Patient 09/29/24 1028     (approximate)   History   Shortness of Breath   HPI  [name] is a 83 y.o. female with extensive past medical history including CHF COPD diabetes, end-stage renal disease on dialysis who presents with shortness of breath.  She has a mild cough but reports her breathing is worsened over the last several days.  She notes during dialysis last week she had to have abbreviated sessions because she was feeling lightheaded.  She denies fevers or chills.  No body aches.     Physical Exam   Triage Vital Signs: ED Triage Vitals  Encounter Vitals Group     BP 09/29/24 1003 (!) 172/64     [name] --      [name] --      [name] --      [name] --      Pulse Rate 09/29/24 1003 74     Resp 09/29/24 1003 20     Temp 09/29/24 1003 (!) 97.5 F (36.4 C)     Temp src --      SpO2 09/29/24 1003 96 %     Weight 09/29/24 1004 79.8 kg (176 lb)     Height 09/29/24 1004 1.575 m (5' 2)     Head Circumference --      Peak Flow --      Pain Score 09/29/24 1004 0     Pain Loc --      Pain Education --      Exclude from Growth Chart --     Most recent vital signs: Vitals:   09/29/24 1411 09/29/24 1414  BP: (!) 166/60   Pulse: 80   Resp:    Temp:  98 F (36.7 C)  SpO2: 99%      General: Awake, no distress.  CV:  Good peripheral perfusion.  Resp:  Mild tachypnea, bibasilar rales, scattered wheezes Abd:  No distention.  Other:     ED Results / Procedures / Treatments   Labs (all labs ordered are listed, but only abnormal results are displayed) Labs Reviewed  BASIC METABOLIC PANEL WITH GFR - Abnormal; Notable for the following components:      Result Value   Glucose, Bld 188 (*)    BUN 50 (*)    Creatinine, Ser 3.23 (*)    GFR, Estimated 14 (*)    Anion gap  16 (*)    All other components within normal limits  CBC - Abnormal; Notable for the following components:   Hemoglobin 11.6 (*)    RDW 16.2 (*)    Platelets 146 (*)    All other components within normal limits  PRO BRAIN NATRIURETIC PEPTIDE - Abnormal; Notable for the following components:   Pro Brain Natriuretic Peptide 29,321.0 (*)    All other components within normal limits  TROPONIN T, HIGH SENSITIVITY - Abnormal; Notable for the following components:   Troponin T High Sensitivity 58 (*)    All other components within normal limits  SARS CORONAVIRUS 2 BY RT PCR  HEPATITIS B SURFACE ANTIGEN  RENAL FUNCTION PANEL  CBC  URINALYSIS, COMPLETE (UACMP) WITH MICROSCOPIC     EKG  ED ECG REPORT I, Lamar Price, the attending physician, personally viewed and interpreted this ECG.  Date: 09/29/2024  Rhythm: normal sinus  rhythm QRS Axis: normal Intervals: normal ST/T Wave abnormalities: normal Narrative Interpretation: no evidence of acute ischemia    RADIOLOGY Chest x-ray with cardiomegaly, likely interstitial edema    PROCEDURES:  Critical Care performed:   Procedures   MEDICATIONS ORDERED IN ED: Medications  Chlorhexidine  Gluconate Cloth 2 % PADS 6 each (has no administration in time range)  pentafluoroprop-tetrafluoroeth (GEBAUERS) aerosol 1 Application (has no administration in time range)  lidocaine  (PF) (XYLOCAINE ) 1 % injection 5 mL (has no administration in time range)  lidocaine -prilocaine  (EMLA ) cream 1 Application (has no administration in time range)  pentafluoroprop-tetrafluoroeth (GEBAUERS) aerosol 1 Application (has no administration in time range)  lidocaine -prilocaine  (EMLA ) cream 1 Application (has no administration in time range)  heparin  injection 1,000 Units (has no administration in time range)  sodium chloride  flush (NS) 0.9 % injection 3 mL (3 mLs Intravenous Given 09/29/24 1415)  sodium chloride  flush (NS) 0.9 % injection 3 mL (has no  administration in time range)  0.9 %  sodium chloride  infusion (has no administration in time range)  acetaminophen  (TYLENOL ) tablet 650 mg (has no administration in time range)  ondansetron  (ZOFRAN ) injection 4 mg (has no administration in time range)  zolpidem (AMBIEN) tablet 5 mg (has no administration in time range)  allopurinol  (ZYLOPRIM ) tablet 100 mg (has no administration in time range)  hydrALAZINE  (APRESOLINE ) tablet 25 mg (has no administration in time range)  metoprolol  tartrate (LOPRESSOR ) tablet 12.5 mg (has no administration in time range)  rosuvastatin  (CRESTOR ) tablet 40 mg (has no administration in time range)  torsemide  (DEMADEX ) tablet 40 mg (has no administration in time range)  sertraline  (ZOLOFT ) tablet 100 mg (has no administration in time range)  levothyroxine  (SYNTHROID ) tablet 150 mcg (has no administration in time range)  pantoprazole  (PROTONIX ) EC tablet 40 mg (has no administration in time range)  apixaban  (ELIQUIS ) tablet 2.5 mg (has no administration in time range)  albuterol  (VENTOLIN  HFA) 108 (90 Base) MCG/ACT inhaler 2 puff (has no administration in time range)  fluticasone  furoate-vilanterol (BREO ELLIPTA ) 200-25 MCG/ACT 1 puff (has no administration in time range)  ipratropium-albuterol  (DUONEB) 0.5-2.5 (3) MG/3ML nebulizer solution 3 mL (has no administration in time range)  montelukast  (SINGULAIR ) tablet 10 mg (has no administration in time range)  methylPREDNISolone  sodium succinate (SOLU-MEDROL ) 125 mg/2 mL injection 125 mg (125 mg Intravenous Given 09/29/24 1046)  ipratropium-albuterol  (DUONEB) 0.5-2.5 (3) MG/3ML nebulizer solution 3 mL (3 mLs Nebulization Given 09/29/24 1047)  ipratropium-albuterol  (DUONEB) 0.5-2.5 (3) MG/3ML nebulizer solution 3 mL (3 mLs Nebulization Given 09/29/24 1047)  ipratropium-albuterol  (DUONEB) 0.5-2.5 (3) MG/3ML nebulizer solution 3 mL (3 mLs Nebulization Given 09/29/24 1402)     IMPRESSION / MDM / ASSESSMENT AND PLAN /  ED COURSE  I reviewed the triage vital signs and the nursing notes. Patient's presentation is most consistent with severe exacerbation of chronic illness.  Patient presents with shortness of breath as detailed above, somewhat worse with lying down.  Suspicious for CHF exacerbation versus COPD exacerbation.  Scattered wheezing on exam as well as some rales  Lab work is noted for elevated BNP, elevated troponin which appears to be chronically elevated  Chest x-ray demonstrates likely interstitial edema, the patient does make urine, will give some IV Lasix , have discussed with Dr. Dennise of nephrology he will see the patient and arrange for dialysis  Consult to the hospitalist team for admission      FINAL CLINICAL IMPRESSION(S) / ED DIAGNOSES   Final diagnoses:  Shortness of breath  Rx / DC Orders   ED Discharge Orders     None        Note:  This document was prepared using Dragon voice recognition software and may include unintentional dictation errors.   Arlander Charleston, MD 09/29/24 1455  "

## 2024-09-29 NOTE — Progress Notes (Signed)
 " [facility]  ROUNDING NOTE   Subjective:   [name] is well known to our practice and receives dialysis at [facility] on a MWF schedule. 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] Depression, unspecified depression type [F32.A] Chronic obstructive pulmonary disease, unspecified COPD type (HCC) [J44.9]  She states she awoke this morning and started have shortness of breath while dressing for dialysis. She walked to bathroom and was very short of breath while walking back to room. She has taken 3 nebs at home without relief. Denies any missed dialysis treatments.   Labs unremarkable for renal patient. Chest xray shows interstitial edema.   We have been consulted to manage dialysis needs.    Objective:  Vital signs in last 24 hours:  Temp:  [97.5 F (36.4 C)-98.7 F (37.1 C)] 98.7 F (37.1 C) (12/29 1459) Pulse Rate:  [20-89] 86 (12/29 1630) Resp:  [20] 20 (12/29 1003) BP: (147-172)/(58-83) 161/64 (12/29 1630) SpO2:  [91 %-99 %] 95 % (12/29 1630) Weight:  [79.8 kg-81.3 kg] 81.3 kg (12/29 1459)  Weight change:  Filed Weights   09/29/24 1004 09/29/24 1459  Weight: 79.8 kg 81.3 kg    Intake/Output: No intake/output data recorded.   Intake/Output this shift:  No intake/output data recorded.  Physical Exam: General: NAD  Head: Normocephalic, atraumatic. Moist oral mucosal membranes  Eyes: Anicteric  Lungs:  coarse  Heart: Regular rate and rhythm  Abdomen:  Soft, nontender  Extremities:  No peripheral edema.  Neurologic: Awake, alert, conversant  Skin: Warm,dry, no rash  Access: Lt upper AVF    Basic Metabolic Panel: Recent Labs  Lab 09/29/24 1006  NA 140  K 3.5  CL 99  CO2 24  GLUCOSE 188*  BUN 50*   CREATININE 3.23*  CALCIUM  9.4    Liver Function Tests: No results for input(s): AST, ALT, ALKPHOS, BILITOT, PROT, ALBUMIN in the last 168 hours. No results for input(s): LIPASE, AMYLASE in the last 168 hours. No results for input(s): AMMONIA in the last 168 hours.  CBC: Recent Labs  Lab 09/29/24 1006  WBC 7.1  HGB 11.6*  HCT 36.3  MCV 90.5  PLT 146*    Cardiac Enzymes: No results for input(s): CKTOTAL, CKMB, CKMBINDEX, TROPONINI in the last 168 hours.  BNP: Invalid input(s): POCBNP  CBG: No results for input(s): GLUCAP in the last 168 hours.  Microbiology: Results for orders placed or performed during the hospital encounter of 09/29/24  SARS Coronavirus 2 by RT PCR (hospital order, performed in [facility] hospital lab) *cepheid single result test* Anterior Nasal Swab     Status: None   Collection Time: 09/29/24  2:13 PM   Specimen: Anterior Nasal Swab  Result Value Ref Range Status   SARS Coronavirus 2 by RT PCR NEGATIVE NEGATIVE Final    Comment: (NOTE) SARS-CoV-2 target nucleic acids are NOT DETECTED.  The SARS-CoV-2 RNA is generally detectable in upper and lower respiratory specimens during the acute phase of infection. The lowest concentration of SARS-CoV-2 viral copies this assay can detect is 250 copies / mL. A negative result does not preclude SARS-CoV-2 infection and should not be used as the sole basis for treatment or other patient management decisions.  A negative result may occur with improper specimen collection / handling, submission of specimen other than  nasopharyngeal swab, presence of viral mutation(s) within the areas targeted by this assay, and inadequate number of viral copies (<250 copies / mL). A negative result must be combined with clinical observations, patient history, and epidemiological information.  Fact Sheet for Patients:   roadlaptop.co.za  Fact Sheet for Healthcare  Providers: http://kim-miller.com/  This test is not yet approved or  cleared by the [place]  FDA and has been authorized for detection and/or diagnosis of SARS-CoV-2 by FDA under an Emergency Use Authorization (EUA).  This EUA will remain in effect (meaning this test can be used) for the duration of the COVID-19 declaration under Section 564(b)(1) of the Act, 21 U.S.C. section 360bbb-3(b)(1), unless the authorization is terminated or revoked sooner.  Performed at [facility], [address] [place], [place] [address]     Coagulation Studies: No results for input(s): LABPROT, INR in the last 72 hours.  Urinalysis: No results for input(s): COLORURINE, LABSPEC, PHURINE, GLUCOSEU, HGBUR, BILIRUBINUR, KETONESUR, PROTEINUR, UROBILINOGEN, NITRITE, LEUKOCYTESUR in the last 72 hours.  Invalid input(s): APPERANCEUR    Imaging: DG Chest 2 View Result Date: 09/29/2024 EXAM: 2 VIEW(S) XRAY OF THE CHEST 09/29/2024 10:27:00 AM COMPARISON: 05/18/2024 CLINICAL HISTORY: sob sob FINDINGS: LINES, TUBES AND DEVICES: Interval removal of left dialysis catheter. LUNGS AND PLEURA: Interstitial prominence throughout the lungs could reflect interstitial edema. No pleural effusion. No pneumothorax. HEART AND MEDIASTINUM: Cardiomegaly. Aortic atherosclerosis. BONES AND SOFT TISSUES: No acute osseous abnormality. IMPRESSION: 1. Cardiomegaly. 2. Interstitial prominence throughout the lungs, which could reflect interstitial edema. Electronically signed by: Kevin Dover MD 09/29/2024 12:32 PM EST RP Workstation: [id number]     Medications:    sodium chloride       [START ON 09/30/2024] allopurinol   100 mg Oral Daily   apixaban   2.5 mg Oral BID   carvedilol   3.125 mg Oral BID WC   [START ON 09/30/2024] Chlorhexidine  Gluconate Cloth  6 each Topical Q0600   [START ON 09/30/2024] fluticasone  furoate-vilanterol  1 puff Inhalation Daily   hydrALAZINE    25 mg Oral Q8H   insulin  aspart  0-6 Units Subcutaneous TID WC   ipratropium-albuterol   3 mL Nebulization Q6H   [START ON 09/30/2024] levothyroxine   150 mcg Oral Q0600   [START ON 09/30/2024] methylPREDNISolone  (SOLU-MEDROL ) injection  40 mg Intravenous Daily   montelukast   10 mg Oral QHS   pantoprazole   40 mg Oral Daily   rosuvastatin   40 mg Oral QPM   sertraline   100 mg Oral Daily   sodium chloride  flush  3 mL Intravenous Q12H   [START ON 09/30/2024] torsemide   40 mg Oral Daily   sodium chloride , acetaminophen , albuterol , heparin , [START ON 09/30/2024] lidocaine  (PF), [START ON 09/30/2024] lidocaine -prilocaine , lidocaine -prilocaine , ondansetron  (ZOFRAN ) IV, [START ON 09/30/2024] pentafluoroprop-tetrafluoroeth, pentafluoroprop-tetrafluoroeth, sodium chloride  flush, zolpidem  Assessment/ Plan:  [name] is a 83 y.o.  female is well known to our practice and receives dialysis at [facility] on a MWF schedule. 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 Remains on room air. Will perform dialysis with fluid removal.   2. End stage renal disease on hemodialysis. Last treatment received on Friday. Will perform dialysis today, UF goal 2L as tolerated.  Next treatment scheduled for Wednesday.   3. Anemia of chronic kidney disease Lab Results  Component Value Date   HGB 11.6 (L) 09/29/2024    Hgb within optimal range, will continue to monitor.   4. Secondary Hyperparathyroidism: with outpatient labs: Unavailable  Lab Results  Component Value Date   PTH  130.0 08/11/2024   CALCIUM  9.4 09/29/2024   PHOS 3.4 08/27/2024    Will continue to monitor bone minerals.    LOS: 0 [name] 12/29/20255:17 PM   "

## 2024-09-29 NOTE — Progress Notes (Signed)
" °   09/29/24 1854  Post Treatment  Tolerated HD Treatment Yes  Post-Hemodialysis Comments Pt tolerated tx. Vs have remained stable.Patient is alert and oriented x3. Complained of shortness of which resolved with reduced goal and nebulizer treatment. Report given and patient returned to the ED  Fistula / Graft Left Upper arm  No placement date or time found.   Placed prior to admission: Yes  Orientation: Left  Access Location: Upper arm  Site Condition No complications  Fistula / Graft Assessment Present;Thrill;Bruit  Status Deaccessed;Patent    "

## 2024-09-29 NOTE — ED Triage Notes (Signed)
 Pt comes via <EMS with c/o sob for couple days. Pt states she did 3 treatments at home with little relief. Pt states she hasn't missed any dialysis. Pt got dizzy and they stopped treatment earlier and only did 1 hour.

## 2024-09-29 NOTE — Progress Notes (Signed)
 Pt receives outpt HD at [facility] on MWF@ 11am. Navigator following to assist with any HD needs.   Suzen Satchel Dialysis Navigator [phone]

## 2024-09-30 ENCOUNTER — Observation Stay

## 2024-09-30 DIAGNOSIS — J9601 Acute respiratory failure with hypoxia: Secondary | ICD-10-CM | POA: Diagnosis not present

## 2024-09-30 LAB — BASIC METABOLIC PANEL WITH GFR
Anion gap: 14 (ref 5–15)
BUN: 37 mg/dL — ABNORMAL HIGH (ref 8–23)
CO2: 28 mmol/L (ref 22–32)
Calcium: 10.2 mg/dL (ref 8.9–10.3)
Chloride: 97 mmol/L — ABNORMAL LOW (ref 98–111)
Creatinine, Ser: 2.37 mg/dL — ABNORMAL HIGH (ref 0.44–1.00)
GFR, Estimated: 20 mL/min — ABNORMAL LOW
Glucose, Bld: 124 mg/dL — ABNORMAL HIGH (ref 70–99)
Potassium: 3.8 mmol/L (ref 3.5–5.1)
Sodium: 139 mmol/L (ref 135–145)

## 2024-09-30 LAB — GLUCOSE, CAPILLARY
Glucose-Capillary: 198 mg/dL — ABNORMAL HIGH (ref 70–99)
Glucose-Capillary: 137 mg/dL — ABNORMAL HIGH (ref 70–99)
Glucose-Capillary: 119 mg/dL — ABNORMAL HIGH (ref 70–99)

## 2024-09-30 LAB — RESP PANEL BY RT-PCR (RSV, FLU A&B, COVID)  RVPGX2
Influenza A by PCR: NEGATIVE
Influenza B by PCR: NEGATIVE
Resp Syncytial Virus by PCR: NEGATIVE
SARS Coronavirus 2 by RT PCR: NEGATIVE

## 2024-09-30 LAB — HEMOGLOBIN A1C
Hgb A1c MFr Bld: 5.4 % (ref 4.8–5.6)
Mean Plasma Glucose: 108.28 mg/dL

## 2024-09-30 LAB — HEPATITIS B SURFACE ANTIGEN: Hepatitis B Surface Ag: NONREACTIVE

## 2024-09-30 NOTE — Progress Notes (Signed)
" °  Chaplain On-Call responded to Spiritual Care Consult Order from Cort IVAR Mana, MD. The request was for prayer with the patient.  Chaplain donned the required PPE and met the patient at her bedside.  Chaplain provided prayer and spiritual and emotional support with the patient.  Chaplain Bebe Ardean EMERSON Hershal., BCC   "

## 2024-09-30 NOTE — Progress Notes (Signed)
 "  PROGRESS NOTE    [name]  FMW:[id number]  DOB: 10/09/1940  DOA: 09/29/2024 PCP: Ostwalt, Janna, PA-C Outpatient Specialists:   Hospital course:  83 y.o. female with medical history significant of ESRD MWF, HTN/chronic HFpEF, PAF on Eliquis , HLD, IIDM, COPD Gold stage I, obesity, presented with worsening of cough and shortness of breath after having her hemodialysis schedule adjusted last week.  She has gotten less dialysis than usual.  Workup revealed interstitial edema on chest x-ray.  Patient was admitted for emergent hemodialysis.  Subjective:  Patient states she does feel better but is still very tired, has orthopnea and cough.  Does not feel well enough to go home.  Objective: Vitals:   09/30/24 0758 09/30/24 0807 09/30/24 1247 09/30/24 1615  BP: (!) 135/51  (!) 158/93 (!) 153/62  Pulse: 67  77 67  Resp: 15     Temp: 98.2 F (36.8 C)  98.2 F (36.8 C) 97.8 F (36.6 C)  TempSrc: Oral  Oral Oral  SpO2: 97% 93% 97% 98%  Weight:      Height:        Intake/Output Summary (Last 24 hours) at 09/30/2024 1741 Last data filed at 09/29/2024 1835 Gross per 24 hour  Intake --  Output 900 ml  Net -900 ml   Filed Weights   09/29/24 1835 09/29/24 2008 09/30/24 0500  Weight: 87.1 kg 93.9 kg 86 kg     Exam:  General: Patient sitting up in bed getting bathed by nurses aide with intermittent dry cough. Eyes: sclera anicteric, conjuctiva mild injection bilaterally CVS: S1-S2, regular  Respiratory:  rales at bases  GI: NABS, soft, NT  LE: Warm and well-perfused Neuro: A/O x 3,  grossly nonfocal.  Psych: patient is logical and coherent, judgement and insight appear normal, mood and affect appropriate to situation.  Data Reviewed:  Basic Metabolic Panel: Recent Labs  Lab 09/29/24 1006 09/30/24 0641  NA 140 139  K 3.5 3.8  CL 99 97*  CO2 24 28  GLUCOSE 188* 124*  BUN 50* 37*  CREATININE 3.23* 2.37*  CALCIUM  9.4 10.2    CBC: Recent Labs  Lab  09/29/24 1006  WBC 7.1  HGB 11.6*  HCT 36.3  MCV 90.5  PLT 146*     Scheduled Meds:  allopurinol   100 mg Oral Daily   apixaban   2.5 mg Oral BID   carvedilol   3.125 mg Oral BID WC   Chlorhexidine  Gluconate Cloth  6 each Topical Q0600   fluticasone  furoate-vilanterol  1 puff Inhalation Daily   hydrALAZINE   25 mg Oral Q8H   insulin  aspart  0-6 Units Subcutaneous TID WC   ipratropium-albuterol   3 mL Nebulization Q6H   levothyroxine   150 mcg Oral Q0600   methylPREDNISolone  (SOLU-MEDROL ) injection  40 mg Intravenous Daily   montelukast   10 mg Oral QHS   pantoprazole   40 mg Oral Daily   rosuvastatin   40 mg Oral QPM   sertraline   100 mg Oral Daily   sodium chloride  flush  3 mL Intravenous Q12H   torsemide   40 mg Oral Daily   Continuous Infusions:   Assessment & Plan:   Pulmonary edema secondary to missed dialysis ESRD Patient with improved symptoms after emergency dialysis yesterday Will need to be dialyzed again tomorrow per nephrology recommendations. Continue torsemide  40 mg daily Influenza and COVID swabs are negative  COPD Patient was diagnosed with acute exacerbation, treated with steroids and inhaled bronchodilators, will continue present course of treatment  PAF  Presently in sinus rhythm Continue Eliquis   Hypothyroidism Continue Synthroid   Anxiety and depression Continue home medication   DVT prophylaxis: Eliquis  Code Status: Full Family Communication: None today     Studies: DG Chest 1 View Result Date: 09/30/2024 EXAM: 1 VIEW(S) XRAY OF THE CHEST 09/30/2024 07:05:00 AM COMPARISON: 09/29/2024 CLINICAL HISTORY: CHF (congestive heart failure) (HCC) FINDINGS: LUNGS AND PLEURA: Mild interstitial prominence, similar to prior exam. No pleural effusion. No pneumothorax. HEART AND MEDIASTINUM: Stable cardiomegaly. Aortic atherosclerosis. BONES AND SOFT TISSUES: No acute osseous abnormality. IMPRESSION: 1. Stable cardiomegaly. 2. Aortic atherosclerosis. 3. Mild  interstitial prominence, similar to prior exam. Electronically signed by: Evalene Coho MD 09/30/2024 07:15 AM EST RP Workstation: [id number]   DG Chest 2 View Result Date: 09/29/2024 EXAM: 2 VIEW(S) XRAY OF THE CHEST 09/29/2024 10:27:00 AM COMPARISON: 05/18/2024 CLINICAL HISTORY: sob sob FINDINGS: LINES, TUBES AND DEVICES: Interval removal of left dialysis catheter. LUNGS AND PLEURA: Interstitial prominence throughout the lungs could reflect interstitial edema. No pleural effusion. No pneumothorax. HEART AND MEDIASTINUM: Cardiomegaly. Aortic atherosclerosis. BONES AND SOFT TISSUES: No acute osseous abnormality. IMPRESSION: 1. Cardiomegaly. 2. Interstitial prominence throughout the lungs, which could reflect interstitial edema. Electronically signed by: Franky Crease MD 09/29/2024 12:32 PM EST RP Workstation: [id number]    Principal Problem:   CHF (congestive heart failure) (HCC) Active Problems:   Acute on chronic diastolic CHF (congestive heart failure) (HCC)   Acute respiratory failure with hypoxia (HCC)     [name], [facility]  If 7PM-7AM, please contact night-coverage [email or web address]   LOS: 0 days  "

## 2024-09-30 NOTE — Progress Notes (Signed)
 Heart Failure Navigator Progress Note  Assessed for Heart & Vascular TOC clinic readiness.  Patient does not meet criteria due to ESRD on Hemodialysis.   Navigator will sign off at this time.  Roxy Horseman, RN, BSN [facility] Heart Failure Navigator Secure Chat Only

## 2024-09-30 NOTE — Care Management Obs Status (Signed)
 MEDICARE OBSERVATION STATUS NOTIFICATION   Patient Details  Name: [name] MRN: [id number] Date of Birth: 1941/06/04   Medicare Observation Status Notification Given:  Chaney BRANDY CHRISTIANE LELON, CMA 09/30/2024, 1:20 PM

## 2024-09-30 NOTE — Progress Notes (Signed)
 " [facility]  ROUNDING NOTE   Subjective:   [name] is well known to our practice and receives dialysis at [facility] on a MWF schedule. 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] Depression, unspecified depression type [F32.A] Chronic obstructive pulmonary disease, unspecified COPD type (HCC) [J44.9]  Update Patient seen sitting at bedside Continues to have cough Reports shortness of breath overnight States she is not ready for discharge.    Objective:  Vital signs in last 24 hours:  Temp:  [97.7 F (36.5 C)-98.7 F (37.1 C)] 98.2 F (36.8 C) (12/30 1247) Pulse Rate:  [20-89] 77 (12/30 1247) Resp:  [15-20] 15 (12/30 0758) BP: (128-166)/(51-93) 158/93 (12/30 1247) SpO2:  [93 %-100 %] 97 % (12/30 1247) Weight:  [81.3 kg-93.9 kg] 86 kg (12/30 0500)  Weight change:  Filed Weights   09/29/24 1835 09/29/24 2008 09/30/24 0500  Weight: 87.1 kg 93.9 kg 86 kg    Intake/Output: I/O last 3 completed shifts: In: -  Out: 900 [Other:900]   Intake/Output this shift:  No intake/output data recorded.  Physical Exam: General: NAD  Head: Normocephalic, atraumatic. Moist oral mucosal membranes  Eyes: Anicteric  Lungs:  Coarse, cough  Heart: Regular rate and rhythm  Abdomen:  Soft, nontender  Extremities:  No peripheral edema.  Neurologic: Awake, alert, conversant  Skin: Warm,dry, no rash  Access: Lt upper AVF    Basic Metabolic Panel: Recent Labs  Lab 09/29/24 1006 09/30/24 0641  NA 140 139  K 3.5 3.8  CL 99 97*  CO2 24 28  GLUCOSE 188* 124*  BUN 50* 37*  CREATININE 3.23* 2.37*  CALCIUM  9.4 10.2    Liver Function Tests: No results for input(s): AST, ALT, ALKPHOS, BILITOT, PROT, ALBUMIN in  the last 168 hours. No results for input(s): LIPASE, AMYLASE in the last 168 hours. No results for input(s): AMMONIA in the last 168 hours.  CBC: Recent Labs  Lab 09/29/24 1006  WBC 7.1  HGB 11.6*  HCT 36.3  MCV 90.5  PLT 146*    Cardiac Enzymes: No results for input(s): CKTOTAL, CKMB, CKMBINDEX, TROPONINI in the last 168 hours.  BNP: Invalid input(s): POCBNP  CBG: Recent Labs  Lab 09/30/24 1053 09/30/24 1213  GLUCAP 198* 137*    Microbiology: Results for orders placed or performed during the hospital encounter of 09/29/24  SARS Coronavirus 2 by RT PCR (hospital order, performed in [facility] hospital lab) *cepheid single result test* Anterior Nasal Swab     Status: None   Collection Time: 09/29/24  2:13 PM   Specimen: Anterior Nasal Swab  Result Value Ref Range Status   SARS Coronavirus 2 by RT PCR NEGATIVE NEGATIVE Final    Comment: (NOTE) SARS-CoV-2 target nucleic acids are NOT DETECTED.  The SARS-CoV-2 RNA is generally detectable in upper and lower respiratory specimens during the acute phase of infection. The lowest concentration of SARS-CoV-2 viral copies this assay can detect is 250 copies / mL. A negative result does not preclude SARS-CoV-2 infection and should not be used as the sole basis for treatment or other patient management decisions.  A negative result may occur with improper specimen collection / handling, submission of specimen other than nasopharyngeal swab, presence of viral mutation(s) within the areas targeted by this assay, and inadequate number of viral copies (<  250 copies / mL). A negative result must be combined with clinical observations, patient history, and epidemiological information.  Fact Sheet for Patients:   roadlaptop.co.za  Fact Sheet for Healthcare Providers: http://kim-miller.com/  This test is not yet approved or  cleared by the [place]  FDA and has been  authorized for detection and/or diagnosis of SARS-CoV-2 by FDA under an Emergency Use Authorization (EUA).  This EUA will remain in effect (meaning this test can be used) for the duration of the COVID-19 declaration under Section 564(b)(1) of the Act, 21 U.S.C. section 360bbb-3(b)(1), unless the authorization is terminated or revoked sooner.  Performed at [facility], [address] [place], [place] [address]   Resp panel by RT-PCR (RSV, Flu A&B, Covid) Anterior Nasal Swab     Status: None   Collection Time: 09/30/24  1:24 AM   Specimen: Anterior Nasal Swab  Result Value Ref Range Status   SARS Coronavirus 2 by RT PCR NEGATIVE NEGATIVE Final    Comment: (NOTE) SARS-CoV-2 target nucleic acids are NOT DETECTED.  The SARS-CoV-2 RNA is generally detectable in upper respiratory specimens during the acute phase of infection. The lowest concentration of SARS-CoV-2 viral copies this assay can detect is 138 copies/mL. A negative result does not preclude SARS-Cov-2 infection and should not be used as the sole basis for treatment or other patient management decisions. A negative result may occur with  improper specimen collection/handling, submission of specimen other than nasopharyngeal swab, presence of viral mutation(s) within the areas targeted by this assay, and inadequate number of viral copies(<138 copies/mL). A negative result must be combined with clinical observations, patient history, and epidemiological information. The expected result is Negative.  Fact Sheet for Patients:  bloggercourse.com  Fact Sheet for Healthcare Providers:  seriousbroker.it  This test is no t yet approved or cleared by the [place]  FDA and  has been authorized for detection and/or diagnosis of SARS-CoV-2 by FDA under an Emergency Use Authorization (EUA). This EUA will remain  in effect (meaning this test can be used) for the duration of  the COVID-19 declaration under Section 564(b)(1) of the Act, 21 U.S.C.section 360bbb-3(b)(1), unless the authorization is terminated  or revoked sooner.       Influenza A by PCR NEGATIVE NEGATIVE Final   Influenza B by PCR NEGATIVE NEGATIVE Final    Comment: (NOTE) The Xpert Xpress SARS-CoV-2/FLU/RSV plus assay is intended as an aid in the diagnosis of influenza from Nasopharyngeal swab specimens and should not be used as a sole basis for treatment. Nasal washings and aspirates are unacceptable for Xpert Xpress SARS-CoV-2/FLU/RSV testing.  Fact Sheet for Patients: bloggercourse.com  Fact Sheet for Healthcare Providers: seriousbroker.it  This test is not yet approved or cleared by the [place]  FDA and has been authorized for detection and/or diagnosis of SARS-CoV-2 by FDA under an Emergency Use Authorization (EUA). This EUA will remain in effect (meaning this test can be used) for the duration of the COVID-19 declaration under Section 564(b)(1) of the Act, 21 U.S.C. section 360bbb-3(b)(1), unless the authorization is terminated or revoked.     Resp Syncytial Virus by PCR NEGATIVE NEGATIVE Final    Comment: (NOTE) Fact Sheet for Patients: bloggercourse.com  Fact Sheet for Healthcare Providers: seriousbroker.it  This test is not yet approved or cleared by the [place]  FDA and has been authorized for detection and/or diagnosis of SARS-CoV-2 by FDA under an Emergency Use Authorization (EUA). This EUA will remain in effect (meaning this test can be used)  for the duration of the COVID-19 declaration under Section 564(b)(1) of the Act, 21 U.S.C. section 360bbb-3(b)(1), unless the authorization is terminated or revoked.  Performed at [facility], [address] [place], [place] [address]     Coagulation Studies: No results for input(s): LABPROT, INR  in the last 72 hours.  Urinalysis: No results for input(s): COLORURINE, LABSPEC, PHURINE, GLUCOSEU, HGBUR, BILIRUBINUR, KETONESUR, PROTEINUR, UROBILINOGEN, NITRITE, LEUKOCYTESUR in the last 72 hours.  Invalid input(s): APPERANCEUR    Imaging: DG Chest 1 View Result Date: 09/30/2024 EXAM: 1 VIEW(S) XRAY OF THE CHEST 09/30/2024 07:05:00 AM COMPARISON: 09/29/2024 CLINICAL HISTORY: CHF (congestive heart failure) (HCC) FINDINGS: LUNGS AND PLEURA: Mild interstitial prominence, similar to prior exam. No pleural effusion. No pneumothorax. HEART AND MEDIASTINUM: Stable cardiomegaly. Aortic atherosclerosis. BONES AND SOFT TISSUES: No acute osseous abnormality. IMPRESSION: 1. Stable cardiomegaly. 2. Aortic atherosclerosis. 3. Mild interstitial prominence, similar to prior exam. Electronically signed by: Evalene Coho MD 09/30/2024 07:15 AM EST RP Workstation: [id number]   DG Chest 2 View Result Date: 09/29/2024 EXAM: 2 VIEW(S) XRAY OF THE CHEST 09/29/2024 10:27:00 AM COMPARISON: 05/18/2024 CLINICAL HISTORY: sob sob FINDINGS: LINES, TUBES AND DEVICES: Interval removal of left dialysis catheter. LUNGS AND PLEURA: Interstitial prominence throughout the lungs could reflect interstitial edema. No pleural effusion. No pneumothorax. HEART AND MEDIASTINUM: Cardiomegaly. Aortic atherosclerosis. BONES AND SOFT TISSUES: No acute osseous abnormality. IMPRESSION: 1. Cardiomegaly. 2. Interstitial prominence throughout the lungs, which could reflect interstitial edema. Electronically signed by: Kevin Dover MD 09/29/2024 12:32 PM EST RP Workstation: [id number]     Medications:    sodium chloride       allopurinol   100 mg Oral Daily   apixaban   2.5 mg Oral BID   carvedilol   3.125 mg Oral BID WC   Chlorhexidine  Gluconate Cloth  6 each Topical Q0600   fluticasone  furoate-vilanterol  1 puff Inhalation Daily   hydrALAZINE   25 mg Oral Q8H   insulin  aspart  0-6 Units Subcutaneous TID WC    ipratropium-albuterol   3 mL Nebulization Q6H   levothyroxine   150 mcg Oral Q0600   methylPREDNISolone  (SOLU-MEDROL ) injection  40 mg Intravenous Daily   montelukast   10 mg Oral QHS   pantoprazole   40 mg Oral Daily   rosuvastatin   40 mg Oral QPM   sertraline   100 mg Oral Daily   sodium chloride  flush  3 mL Intravenous Q12H   torsemide   40 mg Oral Daily   sodium chloride , acetaminophen , albuterol , ondansetron  (ZOFRAN ) IV, sodium chloride  flush, zolpidem  Assessment/ Plan:  [name] is a 83 y.o.  female is well known to our practice and receives dialysis at [facility] on a MWF schedule. 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 Room air. Cough present and complains of shortness of breath overnight  2. End stage renal disease on hemodialysis. Received dialysis yesterday, UF . Goal reduced due to shortness of breath. Next treatment scheduled for Wednesday.   3. Anemia of chronic kidney disease Lab Results  Component Value Date   HGB 11.6 (L) 09/29/2024    Hgb stable, no need for ESA at this time  4. Secondary Hyperparathyroidism: with outpatient labs: Unavailable  Lab Results  Component Value Date   PTH 130.0 08/11/2024   CALCIUM  10.2 09/30/2024   PHOS 3.4 08/27/2024    Calcium  stable. Will check updated phos in am.   LOS: 0 Aloysuis Ribaudo 12/30/20251:41 PM   "

## 2024-09-30 NOTE — Plan of Care (Signed)

## 2024-10-01 ENCOUNTER — Encounter: Payer: Self-pay | Admitting: Internal Medicine

## 2024-10-01 DIAGNOSIS — J9601 Acute respiratory failure with hypoxia: Secondary | ICD-10-CM | POA: Diagnosis not present

## 2024-10-01 LAB — BASIC METABOLIC PANEL WITH GFR
Anion gap: 14 (ref 5–15)
BUN: 73 mg/dL — ABNORMAL HIGH (ref 8–23)
CO2: 24 mmol/L (ref 22–32)
Calcium: 9.6 mg/dL (ref 8.9–10.3)
Chloride: 99 mmol/L (ref 98–111)
Creatinine, Ser: 3.03 mg/dL — ABNORMAL HIGH (ref 0.44–1.00)
GFR, Estimated: 15 mL/min — ABNORMAL LOW
Glucose, Bld: 118 mg/dL — ABNORMAL HIGH (ref 70–99)
Potassium: 4.2 mmol/L (ref 3.5–5.1)
Sodium: 137 mmol/L (ref 135–145)

## 2024-10-01 LAB — PHOSPHORUS: Phosphorus: 3.3 mg/dL (ref 2.5–4.6)

## 2024-10-01 LAB — GLUCOSE, CAPILLARY
Glucose-Capillary: 110 mg/dL — ABNORMAL HIGH (ref 70–99)
Glucose-Capillary: 232 mg/dL — ABNORMAL HIGH (ref 70–99)
Glucose-Capillary: 258 mg/dL — ABNORMAL HIGH (ref 70–99)

## 2024-10-01 LAB — CBC
HCT: 32.9 % — ABNORMAL LOW (ref 36.0–46.0)
Hemoglobin: 10.9 g/dL — ABNORMAL LOW (ref 12.0–15.0)
MCH: 29.5 pg (ref 26.0–34.0)
MCHC: 33.1 g/dL (ref 30.0–36.0)
MCV: 89.2 fL (ref 80.0–100.0)
Platelets: 173 K/uL (ref 150–400)
RBC: 3.69 MIL/uL — ABNORMAL LOW (ref 3.87–5.11)
RDW: 15.9 % — ABNORMAL HIGH (ref 11.5–15.5)
WBC: 8.7 K/uL (ref 4.0–10.5)
nRBC: 0 % (ref 0.0–0.2)

## 2024-10-01 MED ORDER — INSULIN ASPART 100 UNIT/ML IJ SOLN
3.0000 [IU] | Freq: Once | INTRAMUSCULAR | Status: AC
  Administered 2024-10-01: 3 [IU] via SUBCUTANEOUS
  Filled 2024-10-01: qty 3

## 2024-10-01 MED ORDER — PENTAFLUOROPROP-TETRAFLUOROETH EX AERO: 1.0000 | INHALATION_SPRAY | CUTANEOUS | Status: DC | PRN

## 2024-10-01 MED ORDER — LIDOCAINE-PRILOCAINE 2.5-2.5 % EX CREA: 1.0000 | TOPICAL_CREAM | CUTANEOUS | Status: DC | PRN

## 2024-10-01 MED ORDER — PENTAFLUOROPROP-TETRAFLUOROETH EX AERO
INHALATION_SPRAY | CUTANEOUS | Status: AC
  Filled 2024-10-01: qty 30

## 2024-10-01 MED ORDER — HEPARIN SODIUM (PORCINE) 1000 UNIT/ML DIALYSIS: 1000.0000 [IU] | INTRAMUSCULAR | Status: DC | PRN

## 2024-10-01 MED ORDER — IPRATROPIUM-ALBUTEROL 0.5-2.5 (3) MG/3ML IN SOLN
3.0000 mL | Freq: Two times a day (BID) | RESPIRATORY_TRACT | Status: DC
  Administered 2024-10-01 – 2024-10-03 (×4): 3 mL via RESPIRATORY_TRACT
  Filled 2024-10-01 (×4): qty 3

## 2024-10-01 NOTE — Progress Notes (Signed)
" °  Received patient in bed to unit.   Informed consent signed and in chart.    TX duration:2:55     Transported back to floor  Hand-off given to patient's nurse. No c/o  no acute distress noted    Access used: L AVF Access issues: none  at the 3hr mark pt must have moved access arm and and started c/o bad pain in fistula.  Tried to move needle and pt in severe pain.  Pt off machine 35 min early.  CN  and Np made aware    Total UF removed: 0.1L Medication(s) given: none Post HD VS: wnl       Olivia [name] LPN [facility]   "

## 2024-10-01 NOTE — Progress Notes (Signed)
 "  PROGRESS NOTE    KAIYA BOATMAN  FMW:[id number]  DOB: 10-18-1940  DOA: 09/29/2024 PCP: Ostwalt, Janna, PA-C Outpatient Specialists:   Hospital course:  83 y.o. female with medical history significant of ESRD MWF, HTN/chronic HFpEF, PAF on Eliquis , HLD, IIDM, COPD Gold stage I, obesity, presented with worsening of cough and shortness of breath after having her hemodialysis schedule adjusted last week.  She has gotten less dialysis than usual.  Workup revealed interstitial edema on chest x-ray.  Patient was admitted for emergent hemodialysis.  Subjective:  Patient states she continues to feel better but is much too short of breath to go home.  Notes she was unable to tolerate much during her HD session today, had muscle cramps and was shivering.  Objective: Vitals:   10/01/24 1130 10/01/24 1145 10/01/24 1244 10/01/24 1624  BP: (!) 148/58 (!) 141/53 (!) 142/51 (!) 157/56  Pulse: 65 74 66 68  Resp: (!) 27 19  18   Temp:  97.6 F (36.4 C) 98 F (36.7 C) (!) 97.5 F (36.4 C)  TempSrc:  Oral  Oral  SpO2: 99% 97% 97% 94%  Weight:      Height:        Intake/Output Summary (Last 24 hours) at 10/01/2024 1720 Last data filed at 10/01/2024 1500 Gross per 24 hour  Intake 183 ml  Output 100 ml  Net 83 ml   Filed Weights   09/30/24 0500 10/01/24 0326 10/01/24 0809  Weight: 86 kg 84.8 kg 86.9 kg     Exam:  General: Patient sitting in recliner eating lunch, no real cough noted. Eyes: sclera anicteric, conjuctiva mild injection bilaterally CVS: S1-S2, regular  Respiratory:  rales at bases  GI: NABS, soft, NT  LE: Warm and well-perfused Neuro: A/O x 3,  grossly nonfocal.  Psych: patient is logical and coherent, judgement and insight appear normal, mood and affect appropriate to situation.  Data Reviewed:  Basic Metabolic Panel: Recent Labs  Lab 09/29/24 1006 09/30/24 0641 10/01/24 0459 10/01/24 0500  NA 140 139  --  137  K 3.5 3.8  --  4.2  CL 99 97*  --  99  CO2  24 28  --  24  GLUCOSE 188* 124*  --  118*  BUN 50* 37*  --  73*  CREATININE 3.23* 2.37*  --  3.03*  CALCIUM  9.4 10.2  --  9.6  PHOS  --   --  3.3  --     CBC: Recent Labs  Lab 09/29/24 1006 10/01/24 0741  WBC 7.1 8.7  HGB 11.6* 10.9*  HCT 36.3 32.9*  MCV 90.5 89.2  PLT 146* 173     Scheduled Meds:  allopurinol   100 mg Oral Daily   apixaban   2.5 mg Oral BID   carvedilol   3.125 mg Oral BID WC   Chlorhexidine  Gluconate Cloth  6 each Topical Q0600   fluticasone  furoate-vilanterol  1 puff Inhalation Daily   hydrALAZINE   25 mg Oral Q8H   insulin  aspart  0-6 Units Subcutaneous TID WC   ipratropium-albuterol   3 mL Nebulization BID   levothyroxine   150 mcg Oral Q0600   methylPREDNISolone  (SOLU-MEDROL ) injection  40 mg Intravenous Daily   montelukast   10 mg Oral QHS   pantoprazole   40 mg Oral Daily   rosuvastatin   40 mg Oral QPM   sertraline   100 mg Oral Daily   sodium chloride  flush  3 mL Intravenous Q12H   torsemide   40 mg Oral Daily  Continuous Infusions:   Assessment & Plan:   Pulmonary edema secondary to missed dialysis ESRD Patient reserved emergency dialysis after admission and was dialyzed again today Per nephrology note, unable to tolerate ultrafiltration due to muscle cramps Plan is to dialyze patient on Friday Patient does not feel like she can go home because she is so short of breath Continue torsemide  40 mg daily Influenza and COVID swabs are negative  COPD Patient was diagnosed with acute exacerbation, treated with steroids and inhaled bronchodilators, will continue present course of treatment  PAF Presently in sinus rhythm Continue Eliquis   Hypothyroidism Continue Synthroid   Anxiety and depression Continue home medication   DVT prophylaxis: Eliquis  Code Status: Full Family Communication: None today     Studies: DG Chest 1 View Result Date: 09/30/2024 EXAM: 1 VIEW(S) XRAY OF THE CHEST 09/30/2024 07:05:00 AM COMPARISON: 09/29/2024  CLINICAL HISTORY: CHF (congestive heart failure) (HCC) FINDINGS: LUNGS AND PLEURA: Mild interstitial prominence, similar to prior exam. No pleural effusion. No pneumothorax. HEART AND MEDIASTINUM: Stable cardiomegaly. Aortic atherosclerosis. BONES AND SOFT TISSUES: No acute osseous abnormality. IMPRESSION: 1. Stable cardiomegaly. 2. Aortic atherosclerosis. 3. Mild interstitial prominence, similar to prior exam. Electronically signed by: Evalene Coho MD 09/30/2024 07:15 AM EST RP Workstation: [id number]    Principal Problem:   CHF (congestive heart failure) (HCC) Active Problems:   Acute on chronic diastolic CHF (congestive heart failure) (HCC)   Acute respiratory failure with hypoxia (HCC)     Denaya Horn Vangie Pike, [facility]  If 7PM-7AM, please contact night-coverage [email or web address]   LOS: 0 days  "

## 2024-10-01 NOTE — Progress Notes (Signed)
 " [facility]  ROUNDING NOTE   Subjective:   [name] is well known to our practice and receives dialysis at [facility] on a MWF schedule. 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] Depression, unspecified depression type [F32.A] Chronic obstructive pulmonary disease, unspecified COPD type (HCC) [J44.9]  Update Patient seen and evaluated during dialysis   HEMODIALYSIS FLOWSHEET:  Blood Flow Rate (mL/min): 0 mL/min Arterial Pressure (mmHg): -499.98 mmHg Venous Pressure (mmHg): 7.47 mmHg TMP (mmHg): 13.33 mmHg Ultrafiltration Rate (mL/min): 612 mL/min Dialysate Flow Rate (mL/min): 300 ml/min Dialysis Fluid Bolus: Normal Saline Bolus Amount (mL): 100 mL  States she had some respiratory discomfort this morning. Requested inhaler Continues to have weakness   Objective:  Vital signs in last 24 hours:  Temp:  [97.6 F (36.4 C)-98.2 F (36.8 C)] 97.6 F (36.4 C) (12/31 1145) Pulse Rate:  [62-77] 74 (12/31 1145) Resp:  [14-28] 19 (12/31 1145) BP: (137-163)/(53-93) 141/53 (12/31 1145) SpO2:  [93 %-100 %] 97 % (12/31 1145) Weight:  [84.8 kg-86.9 kg] 86.9 kg (12/31 0809)  Weight change: 4.967 kg Filed Weights   09/30/24 0500 10/01/24 0326 10/01/24 0809  Weight: 86 kg 84.8 kg 86.9 kg    Intake/Output: No intake/output data recorded.   Intake/Output this shift:  Total I/O In: -  Out: 100 [Other:100]  Physical Exam: General: NAD  Head: Normocephalic, atraumatic. Moist oral mucosal membranes  Eyes: Anicteric  Lungs:  Coarse, cough  Heart: Regular rate and rhythm  Abdomen:  Soft, nontender  Extremities:  No peripheral edema.  Neurologic: Awake, alert, conversant  Skin: Warm,dry, no rash  Access: Lt upper AVF     Basic Metabolic Panel: Recent Labs  Lab 09/29/24 1006 09/30/24 0641 10/01/24 0500  NA 140 139 137  K 3.5 3.8 4.2  CL 99 97* 99  CO2 24 28 24   GLUCOSE 188* 124* 118*  BUN 50* 37* 73*  CREATININE 3.23* 2.37* 3.03*  CALCIUM  9.4 10.2 9.6    Liver Function Tests: No results for input(s): AST, ALT, ALKPHOS, BILITOT, PROT, ALBUMIN in the last 168 hours. No results for input(s): LIPASE, AMYLASE in the last 168 hours. No results for input(s): AMMONIA in the last 168 hours.  CBC: Recent Labs  Lab 09/29/24 1006 10/01/24 0741  WBC 7.1 8.7  HGB 11.6* 10.9*  HCT 36.3 32.9*  MCV 90.5 89.2  PLT 146* 173    Cardiac Enzymes: No results for input(s): CKTOTAL, CKMB, CKMBINDEX, TROPONINI in the last 168 hours.  BNP: Invalid input(s): POCBNP  CBG: Recent Labs  Lab 09/30/24 1053 09/30/24 1213 09/30/24 2132  GLUCAP 198* 137* 119*    Microbiology: Results for orders placed or performed during the hospital encounter of 09/29/24  SARS Coronavirus 2 by RT PCR (hospital order, performed in [facility] hospital lab) *cepheid single result test* Anterior Nasal Swab     Status: None   Collection Time: 09/29/24  2:13 PM   Specimen: Anterior Nasal Swab  Result Value Ref Range Status   SARS Coronavirus 2 by RT PCR NEGATIVE NEGATIVE Final    Comment: (NOTE) SARS-CoV-2 target nucleic acids are NOT DETECTED.  The SARS-CoV-2 RNA is generally detectable in upper and lower respiratory specimens during the acute phase of infection. The lowest concentration of SARS-CoV-2 viral copies this assay can detect is 250  copies / mL. A negative result does not preclude SARS-CoV-2 infection and should not be used as the sole basis for treatment or other patient management decisions.  A negative result may occur with improper specimen collection / handling, submission of specimen other than nasopharyngeal swab, presence of viral mutation(s) within the areas targeted by  this assay, and inadequate number of viral copies (<250 copies / mL). A negative result must be combined with clinical observations, patient history, and epidemiological information.  Fact Sheet for Patients:   roadlaptop.co.za  Fact Sheet for Healthcare Providers: http://kim-miller.com/  This test is not yet approved or  cleared by the [place]  FDA and has been authorized for detection and/or diagnosis of SARS-CoV-2 by FDA under an Emergency Use Authorization (EUA).  This EUA will remain in effect (meaning this test can be used) for the duration of the COVID-19 declaration under Section 564(b)(1) of the Act, 21 U.S.C. section 360bbb-3(b)(1), unless the authorization is terminated or revoked sooner.  Performed at [facility], [address] [place], [place] [address]   Resp panel by RT-PCR (RSV, Flu A&B, Covid) Anterior Nasal Swab     Status: None   Collection Time: 09/30/24  1:24 AM   Specimen: Anterior Nasal Swab  Result Value Ref Range Status   SARS Coronavirus 2 by RT PCR NEGATIVE NEGATIVE Final    Comment: (NOTE) SARS-CoV-2 target nucleic acids are NOT DETECTED.  The SARS-CoV-2 RNA is generally detectable in upper respiratory specimens during the acute phase of infection. The lowest concentration of SARS-CoV-2 viral copies this assay can detect is 138 copies/mL. A negative result does not preclude SARS-Cov-2 infection and should not be used as the sole basis for treatment or other patient management decisions. A negative result may occur with  improper specimen collection/handling, submission of specimen other than nasopharyngeal swab, presence of viral mutation(s) within the areas targeted by this assay, and inadequate number of viral copies(<138 copies/mL). A negative result must be combined with clinical observations, patient history, and epidemiological information. The expected result is Negative.  Fact  Sheet for Patients:  bloggercourse.com  Fact Sheet for Healthcare Providers:  seriousbroker.it  This test is no t yet approved or cleared by the [place]  FDA and  has been authorized for detection and/or diagnosis of SARS-CoV-2 by FDA under an Emergency Use Authorization (EUA). This EUA will remain  in effect (meaning this test can be used) for the duration of the COVID-19 declaration under Section 564(b)(1) of the Act, 21 U.S.C.section 360bbb-3(b)(1), unless the authorization is terminated  or revoked sooner.       Influenza A by PCR NEGATIVE NEGATIVE Final   Influenza B by PCR NEGATIVE NEGATIVE Final    Comment: (NOTE) The Xpert Xpress SARS-CoV-2/FLU/RSV plus assay is intended as an aid in the diagnosis of influenza from Nasopharyngeal swab specimens and should not be used as a sole basis for treatment. Nasal washings and aspirates are unacceptable for Xpert Xpress SARS-CoV-2/FLU/RSV testing.  Fact Sheet for Patients: bloggercourse.com  Fact Sheet for Healthcare Providers: seriousbroker.it  This test is not yet approved or cleared by the [place]  FDA and has been authorized for detection and/or diagnosis of SARS-CoV-2 by FDA under an Emergency Use Authorization (EUA). This EUA will remain in effect (meaning this test can be used) for the duration of the COVID-19 declaration under Section 564(b)(1) of the Act, 21 U.S.C. section 360bbb-3(b)(1), unless the authorization is terminated or revoked.     Resp Syncytial Virus by PCR NEGATIVE  NEGATIVE Final    Comment: (NOTE) Fact Sheet for Patients: bloggercourse.com  Fact Sheet for Healthcare Providers: seriousbroker.it  This test is not yet approved or cleared by the [place]  FDA and has been authorized for detection and/or diagnosis of SARS-CoV-2 by FDA under an  Emergency Use Authorization (EUA). This EUA will remain in effect (meaning this test can be used) for the duration of the COVID-19 declaration under Section 564(b)(1) of the Act, 21 U.S.C. section 360bbb-3(b)(1), unless the authorization is terminated or revoked.  Performed at [facility], [address] [place], [place] [address]     Coagulation Studies: No results for input(s): LABPROT, INR in the last 72 hours.  Urinalysis: No results for input(s): COLORURINE, LABSPEC, PHURINE, GLUCOSEU, HGBUR, BILIRUBINUR, KETONESUR, PROTEINUR, UROBILINOGEN, NITRITE, LEUKOCYTESUR in the last 72 hours.  Invalid input(s): APPERANCEUR    Imaging: DG Chest 1 View Result Date: 09/30/2024 EXAM: 1 VIEW(S) XRAY OF THE CHEST 09/30/2024 07:05:00 AM COMPARISON: 09/29/2024 CLINICAL HISTORY: CHF (congestive heart failure) (HCC) FINDINGS: LUNGS AND PLEURA: Mild interstitial prominence, similar to prior exam. No pleural effusion. No pneumothorax. HEART AND MEDIASTINUM: Stable cardiomegaly. Aortic atherosclerosis. BONES AND SOFT TISSUES: No acute osseous abnormality. IMPRESSION: 1. Stable cardiomegaly. 2. Aortic atherosclerosis. 3. Mild interstitial prominence, similar to prior exam. Electronically signed by: Evalene Coho MD 09/30/2024 07:15 AM EST RP Workstation: [id number]     Medications:      allopurinol   100 mg Oral Daily   apixaban   2.5 mg Oral BID   carvedilol   3.125 mg Oral BID WC   Chlorhexidine  Gluconate Cloth  6 each Topical Q0600   fluticasone  furoate-vilanterol  1 puff Inhalation Daily   hydrALAZINE   25 mg Oral Q8H   insulin  aspart  0-6 Units Subcutaneous TID WC   ipratropium-albuterol   3 mL Nebulization BID   levothyroxine   150 mcg Oral Q0600   methylPREDNISolone  (SOLU-MEDROL ) injection  40 mg Intravenous Daily   montelukast   10 mg Oral QHS   pantoprazole   40 mg Oral Daily   rosuvastatin   40 mg Oral QPM   sertraline   100 mg Oral Daily   sodium  chloride flush  3 mL Intravenous Q12H   torsemide   40 mg Oral Daily   acetaminophen , albuterol , heparin , lidocaine -prilocaine , ondansetron  (ZOFRAN ) IV, pentafluoroprop-tetrafluoroeth, sodium chloride  flush, zolpidem  Assessment/ Plan:  [name] is a 83 y.o.  female is well known to our practice and receives dialysis at [facility] on a MWF schedule. 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 Room air.   2. End stage renal disease on hemodialysis. Patient receiving dialysis, UF goal 0.5-1L as tolerated. Terminated treatment 30 min early due to access discomfort. Limited UF due to multiple complaints, shortness of breath, cramping, etc. Next treatment scheduled for Friday.   3. Anemia of chronic kidney disease Lab Results  Component Value Date   HGB 10.9 (L) 10/01/2024    Hgb acceptable  4. Secondary Hyperparathyroidism: with outpatient labs: Unavailable  Lab Results  Component Value Date   PTH 130.0 08/11/2024   CALCIUM  9.6 10/01/2024   PHOS 3.4 08/27/2024    Awaiting updated phos   LOS: 0 Lezette Kitts 12/31/202512:21 PM   "

## 2024-10-01 NOTE — Plan of Care (Signed)

## 2024-10-02 DIAGNOSIS — E669 Obesity, unspecified: Secondary | ICD-10-CM | POA: Diagnosis not present

## 2024-10-02 DIAGNOSIS — R0602 Shortness of breath: Secondary | ICD-10-CM | POA: Diagnosis present

## 2024-10-02 DIAGNOSIS — J441 Chronic obstructive pulmonary disease with (acute) exacerbation: Secondary | ICD-10-CM | POA: Diagnosis not present

## 2024-10-02 DIAGNOSIS — N390 Urinary tract infection, site not specified: Secondary | ICD-10-CM | POA: Diagnosis not present

## 2024-10-02 DIAGNOSIS — I5023 Acute on chronic systolic (congestive) heart failure: Secondary | ICD-10-CM | POA: Diagnosis not present

## 2024-10-02 DIAGNOSIS — M109 Gout, unspecified: Secondary | ICD-10-CM | POA: Diagnosis not present

## 2024-10-02 DIAGNOSIS — J9601 Acute respiratory failure with hypoxia: Secondary | ICD-10-CM | POA: Diagnosis not present

## 2024-10-02 DIAGNOSIS — I480 Paroxysmal atrial fibrillation: Secondary | ICD-10-CM | POA: Diagnosis not present

## 2024-10-02 DIAGNOSIS — Z7901 Long term (current) use of anticoagulants: Secondary | ICD-10-CM | POA: Diagnosis not present

## 2024-10-02 DIAGNOSIS — N186 End stage renal disease: Secondary | ICD-10-CM | POA: Diagnosis not present

## 2024-10-02 DIAGNOSIS — F419 Anxiety disorder, unspecified: Secondary | ICD-10-CM | POA: Diagnosis not present

## 2024-10-02 DIAGNOSIS — E039 Hypothyroidism, unspecified: Secondary | ICD-10-CM | POA: Diagnosis not present

## 2024-10-02 DIAGNOSIS — E1122 Type 2 diabetes mellitus with diabetic chronic kidney disease: Secondary | ICD-10-CM | POA: Diagnosis not present

## 2024-10-02 DIAGNOSIS — I5033 Acute on chronic diastolic (congestive) heart failure: Secondary | ICD-10-CM | POA: Diagnosis not present

## 2024-10-02 DIAGNOSIS — J810 Acute pulmonary edema: Secondary | ICD-10-CM | POA: Diagnosis not present

## 2024-10-02 DIAGNOSIS — Z992 Dependence on renal dialysis: Secondary | ICD-10-CM | POA: Diagnosis not present

## 2024-10-02 DIAGNOSIS — F32A Depression, unspecified: Secondary | ICD-10-CM | POA: Diagnosis not present

## 2024-10-02 DIAGNOSIS — Z6832 Body mass index (BMI) 32.0-32.9, adult: Secondary | ICD-10-CM | POA: Diagnosis not present

## 2024-10-02 DIAGNOSIS — I132 Hypertensive heart and chronic kidney disease with heart failure and with stage 5 chronic kidney disease, or end stage renal disease: Secondary | ICD-10-CM | POA: Diagnosis not present

## 2024-10-02 DIAGNOSIS — Z79899 Other long term (current) drug therapy: Secondary | ICD-10-CM | POA: Diagnosis not present

## 2024-10-02 DIAGNOSIS — E8770 Fluid overload, unspecified: Secondary | ICD-10-CM | POA: Diagnosis not present

## 2024-10-02 LAB — URINALYSIS, COMPLETE (UACMP) WITH MICROSCOPIC
Bilirubin Urine: NEGATIVE
Glucose, UA: NEGATIVE mg/dL
Hgb urine dipstick: NEGATIVE
Ketones, ur: NEGATIVE mg/dL
Nitrite: NEGATIVE
Protein, ur: 100 mg/dL — AB
Specific Gravity, Urine: 1.011 (ref 1.005–1.030)
WBC, UA: >50 WBC/hpf (ref 0–5)
pH: 6 (ref 5.0–8.0)

## 2024-10-02 LAB — GLUCOSE, CAPILLARY
Glucose-Capillary: 144 mg/dL — ABNORMAL HIGH (ref 70–99)
Glucose-Capillary: 95 mg/dL (ref 70–99)
Glucose-Capillary: 232 mg/dL — ABNORMAL HIGH (ref 70–99)
Glucose-Capillary: 247 mg/dL — ABNORMAL HIGH (ref 70–99)
Glucose-Capillary: 147 mg/dL — ABNORMAL HIGH (ref 70–99)

## 2024-10-02 LAB — IRON AND TIBC
Iron: 91 ug/dL (ref 28–170)
Saturation Ratios: 29 % (ref 10.4–31.8)
TIBC: 314 ug/dL (ref 250–450)
UIBC: 222 ug/dL

## 2024-10-02 MED ORDER — GUAIFENESIN ER 600 MG PO TB12
600.0000 mg | ORAL_TABLET | Freq: Two times a day (BID) | ORAL | Status: DC
  Administered 2024-10-02 – 2024-10-04 (×4): 600 mg via ORAL
  Filled 2024-10-02 (×4): qty 1

## 2024-10-02 MED ORDER — HYDRALAZINE HCL 50 MG PO TABS
50.0000 mg | ORAL_TABLET | Freq: Three times a day (TID) | ORAL | Status: DC
  Administered 2024-10-02 – 2024-10-04 (×7): 50 mg via ORAL
  Filled 2024-10-02 (×7): qty 1

## 2024-10-02 MED ORDER — AMLODIPINE BESYLATE 5 MG PO TABS
5.0000 mg | ORAL_TABLET | Freq: Every day | ORAL | Status: DC
  Administered 2024-10-02 – 2024-10-04 (×2): 5 mg via ORAL
  Filled 2024-10-02 (×3): qty 1

## 2024-10-02 MED ORDER — SODIUM CHLORIDE 0.9 % IV SOLN
1.0000 g | INTRAVENOUS | Status: DC
  Administered 2024-10-02 – 2024-10-03 (×2): 1 g via INTRAVENOUS
  Filled 2024-10-02 (×3): qty 10

## 2024-10-02 NOTE — Progress Notes (Signed)
 Mobility Specialist Progress Note:    10/02/24 0900  Mobility  Activity Refused and notified nurse if applicable   Pt kindly refused mobility at this time d/t SOB. SpO2 98% on 2L. Will re-attempt mobility as appropriate, all needs met.  Sherrilee Ditty Mobility Specialist Please contact via Special Educational Needs Teacher or  Rehab office at [phone]

## 2024-10-02 NOTE — Plan of Care (Signed)

## 2024-10-02 NOTE — Progress Notes (Signed)
 " [facility]  ROUNDING NOTE   Subjective:   [name] is well known to our practice and receives dialysis at [facility] on a MWF schedule. 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] Depression, unspecified depression type [F32.A] Chronic obstructive pulmonary disease, unspecified COPD type (HCC) [J44.9]  Update Patient seen laying in bed States she feels well today Continues to have productive cough Continues to have weakness   Objective:  Vital signs in last 24 hours:  Temp:  [97.5 F (36.4 C)-98 F (36.7 C)] 97.6 F (36.4 C) (01/01 0746) Pulse Rate:  [59-76] 66 (01/01 0746) Resp:  [16-27] 16 (01/01 0746) BP: (141-167)/(51-64) 167/57 (01/01 0746) SpO2:  [94 %-100 %] 95 % (01/01 0757) Weight:  [86.1 kg] 86.1 kg (01/01 0500)  Weight change: 2.1 kg Filed Weights   10/01/24 0326 10/01/24 0809 10/02/24 0500  Weight: 84.8 kg 86.9 kg 86.1 kg    Intake/Output: I/O last 3 completed shifts: In: 423 [P.O.:420; I.V.:3] Out: 100 [Other:100]   Intake/Output this shift:  Total I/O In: 243 [P.O.:240; I.V.:3] Out: -   Physical Exam: General: NAD  Head: Normocephalic, atraumatic. Moist oral mucosal membranes  Eyes: Anicteric  Lungs:  Faint wheeze, cough  Heart: Regular rate and rhythm  Abdomen:  Soft, nontender  Extremities:  No peripheral edema.  Neurologic: Awake, alert, conversant  Skin: Warm,dry, no rash  Access: Lt upper AVF    Basic Metabolic Panel: Recent Labs  Lab 09/29/24 1006 09/30/24 0641 10/01/24 0459 10/01/24 0500  NA 140 139  --  137  K 3.5 3.8  --  4.2  CL 99 97*  --  99  CO2 24 28  --  24  GLUCOSE 188* 124*  --  118*  BUN 50* 37*  --  73*  CREATININE 3.23* 2.37*  --  3.03*  CALCIUM  9.4  10.2  --  9.6  PHOS  --   --  3.3  --     Liver Function Tests: No results for input(s): AST, ALT, ALKPHOS, BILITOT, PROT, ALBUMIN in the last 168 hours. No results for input(s): LIPASE, AMYLASE in the last 168 hours. No results for input(s): AMMONIA in the last 168 hours.  CBC: Recent Labs  Lab 09/29/24 1006 10/01/24 0741  WBC 7.1 8.7  HGB 11.6* 10.9*  HCT 36.3 32.9*  MCV 90.5 89.2  PLT 146* 173    Cardiac Enzymes: No results for input(s): CKTOTAL, CKMB, CKMBINDEX, TROPONINI in the last 168 hours.  BNP: Invalid input(s): POCBNP  CBG: Recent Labs  Lab 10/01/24 1245 10/01/24 1629 10/01/24 1955 10/02/24 0114 10/02/24 0746  GLUCAP 110* 232* 258* 144* 95    Microbiology: Results for orders placed or performed during the hospital encounter of 09/29/24  SARS Coronavirus 2 by RT PCR (hospital order, performed in [facility] hospital lab) *cepheid single result test* Anterior Nasal Swab     Status: None   Collection Time: 09/29/24  2:13 PM   Specimen: Anterior Nasal Swab  Result Value Ref Range Status   SARS Coronavirus 2 by RT PCR NEGATIVE NEGATIVE Final    Comment: (NOTE) SARS-CoV-2 target nucleic acids are NOT DETECTED.  The SARS-CoV-2 RNA is generally detectable in upper and lower respiratory specimens during the acute phase of infection. The lowest concentration of SARS-CoV-2 viral copies this  assay can detect is 250 copies / mL. A negative result does not preclude SARS-CoV-2 infection and should not be used as the sole basis for treatment or other patient management decisions.  A negative result may occur with improper specimen collection / handling, submission of specimen other than nasopharyngeal swab, presence of viral mutation(s) within the areas targeted by this assay, and inadequate number of viral copies (<250 copies / mL). A negative result must be combined with clinical observations, patient history, and epidemiological  information.  Fact Sheet for Patients:   roadlaptop.co.za  Fact Sheet for Healthcare Providers: http://kim-miller.com/  This test is not yet approved or  cleared by the [place]  FDA and has been authorized for detection and/or diagnosis of SARS-CoV-2 by FDA under an Emergency Use Authorization (EUA).  This EUA will remain in effect (meaning this test can be used) for the duration of the COVID-19 declaration under Section 564(b)(1) of the Act, 21 U.S.C. section 360bbb-3(b)(1), unless the authorization is terminated or revoked sooner.  Performed at [facility], [address] [place], [place] [address]   Resp panel by RT-PCR (RSV, Flu A&B, Covid) Anterior Nasal Swab     Status: None   Collection Time: 09/30/24  1:24 AM   Specimen: Anterior Nasal Swab  Result Value Ref Range Status   SARS Coronavirus 2 by RT PCR NEGATIVE NEGATIVE Final    Comment: (NOTE) SARS-CoV-2 target nucleic acids are NOT DETECTED.  The SARS-CoV-2 RNA is generally detectable in upper respiratory specimens during the acute phase of infection. The lowest concentration of SARS-CoV-2 viral copies this assay can detect is 138 copies/mL. A negative result does not preclude SARS-Cov-2 infection and should not be used as the sole basis for treatment or other patient management decisions. A negative result may occur with  improper specimen collection/handling, submission of specimen other than nasopharyngeal swab, presence of viral mutation(s) within the areas targeted by this assay, and inadequate number of viral copies(<138 copies/mL). A negative result must be combined with clinical observations, patient history, and epidemiological information. The expected result is Negative.  Fact Sheet for Patients:  bloggercourse.com  Fact Sheet for Healthcare Providers:  seriousbroker.it  This test is no t yet  approved or cleared by the [place]  FDA and  has been authorized for detection and/or diagnosis of SARS-CoV-2 by FDA under an Emergency Use Authorization (EUA). This EUA will remain  in effect (meaning this test can be used) for the duration of the COVID-19 declaration under Section 564(b)(1) of the Act, 21 U.S.C.section 360bbb-3(b)(1), unless the authorization is terminated  or revoked sooner.       Influenza A by PCR NEGATIVE NEGATIVE Final   Influenza B by PCR NEGATIVE NEGATIVE Final    Comment: (NOTE) The Xpert Xpress SARS-CoV-2/FLU/RSV plus assay is intended as an aid in the diagnosis of influenza from Nasopharyngeal swab specimens and should not be used as a sole basis for treatment. Nasal washings and aspirates are unacceptable for Xpert Xpress SARS-CoV-2/FLU/RSV testing.  Fact Sheet for Patients: bloggercourse.com  Fact Sheet for Healthcare Providers: seriousbroker.it  This test is not yet approved or cleared by the [place]  FDA and has been authorized for detection and/or diagnosis of SARS-CoV-2 by FDA under an Emergency Use Authorization (EUA). This EUA will remain in effect (meaning this test can be used) for the duration of the COVID-19 declaration under Section 564(b)(1) of the Act, 21 U.S.C. section 360bbb-3(b)(1), unless the authorization is terminated or revoked.     Resp  Syncytial Virus by PCR NEGATIVE NEGATIVE Final    Comment: (NOTE) Fact Sheet for Patients: bloggercourse.com  Fact Sheet for Healthcare Providers: seriousbroker.it  This test is not yet approved or cleared by the [place]  FDA and has been authorized for detection and/or diagnosis of SARS-CoV-2 by FDA under an Emergency Use Authorization (EUA). This EUA will remain in effect (meaning this test can be used) for the duration of the COVID-19 declaration under Section 564(b)(1) of  the Act, 21 U.S.C. section 360bbb-3(b)(1), unless the authorization is terminated or revoked.  Performed at [facility], [address] [place], [place] [address]     Coagulation Studies: No results for input(s): LABPROT, INR in the last 72 hours.  Urinalysis: Recent Labs    10/01/24 2108  COLORURINE YELLOW*  LABSPEC 1.011  PHURINE 6.0  GLUCOSEU NEGATIVE  HGBUR NEGATIVE  BILIRUBINUR NEGATIVE  KETONESUR NEGATIVE  PROTEINUR 100*  NITRITE NEGATIVE  LEUKOCYTESUR SMALL*      Imaging: No results found.    Medications:      allopurinol   100 mg Oral Daily   amLODipine   5 mg Oral Daily   apixaban   2.5 mg Oral BID   carvedilol   3.125 mg Oral BID WC   Chlorhexidine  Gluconate Cloth  6 each Topical Q0600   fluticasone  furoate-vilanterol  1 puff Inhalation Daily   hydrALAZINE   50 mg Oral Q8H   insulin  aspart  0-6 Units Subcutaneous TID WC   ipratropium-albuterol   3 mL Nebulization BID   levothyroxine   150 mcg Oral Q0600   methylPREDNISolone  (SOLU-MEDROL ) injection  40 mg Intravenous Daily   montelukast   10 mg Oral QHS   pantoprazole   40 mg Oral Daily   rosuvastatin   40 mg Oral QPM   sertraline   100 mg Oral Daily   sodium chloride  flush  3 mL Intravenous Q12H   torsemide   40 mg Oral Daily   acetaminophen , albuterol , ondansetron  (ZOFRAN ) IV, sodium chloride  flush, zolpidem  Assessment/ Plan:  [name] is a 84 y.o.  female is well known to our practice and receives dialysis at [facility] on a MWF schedule. 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 On 1L [place]  2. End stage renal disease on hemodialysis. Received treatment yesterday, UF 100 ml due to cramping, and access discomfort. Next treatment scheduled for Friday.   3. Anemia of chronic kidney disease Lab Results  Component Value Date   HGB 10.9 (L) 10/01/2024    Hgb acceptable. No need for ESA.   4. Secondary Hyperparathyroidism: with outpatient labs: Unavailable  Lab Results  Component Value Date   PTH 130.0 08/11/2024   CALCIUM  9.6 10/01/2024   PHOS 3.3 10/01/2024    Calcium  and phos stable   LOS: 0 Adya Wirz 1/1/202611:14 AM   "

## 2024-10-02 NOTE — Progress Notes (Signed)
 [facility] Progress Note  Patient: [name]    FMW:[id number]  DOA: 09/29/2024     Date of Service: the patient was seen and examined on 10/02/2024  Chief Complaint  Patient presents with   Shortness of Breath   Brief hospital course:  84 y.o. female with medical history significant of ESRD MWF, HTN/chronic HFpEF, PAF on Eliquis , HLD, IIDM, COPD Gold stage I, obesity, presented with worsening of cough and shortness of breath after having her hemodialysis schedule adjusted last week.  She has gotten less dialysis than usual.  Workup revealed interstitial edema on chest x-ray.  Patient was admitted for emergent hemodialysis.    Assessment and Plan:  Pulmonary edema secondary to missed dialysis ESRD Patient reserved emergency dialysis after admission and was dialyzed again today Per nephrology note, unable to tolerate ultrafiltration due to muscle cramps Plan is to dialyze patient on Friday Patient does not feel like she can go home because she is so short of breath Continue torsemide  40 mg daily Influenza and COVID swabs are negative   COPD Patient was diagnosed with acute exacerbation, treated with steroids and inhaled bronchodilators,  1/1 DC'd IV Solu-Medrol , started Mucinex  600 mg p.o. twice daily Continue current inhaler   PAF Presently in sinus rhythm Continue Eliquis    Hypothyroidism Continue Synthroid    Anxiety and depression Continue home medication   UTI, UA positive Started ceftriaxone  1 g IV daily Follow urine culture    Body mass index is 34.72 kg/m.  Interventions:  Diet: Renal diet DVT Prophylaxis: Eliquis   Advance goals of care discussion: DNR-limited  Family Communication: family was not present at bedside, at the time of interview.  The pt provided permission to discuss medical plan with the family. Opportunity was given to ask question and all questions were answered satisfactorily.   Disposition:  Pt is from home, admitted with  shortness of breath due to volume overload and COPD, still has mild shortness of breath, which precludes a safe discharge. Discharge to home, when stable, most likely tomorrow a.m. after hemodialysis.  Subjective: No significant events overnight.  Patient feels improvement in the shortness of breath, still has chest congestion and having difficulty bringing up phlegm.  Patient does not feel going home today, would like to go home tomorrow after hemodialysis.  Physical Exam: General: NAD, lying comfortably Appear in no distress, affect appropriate Eyes: PERRLA ENT: Oral Mucosa Clear, moist  Neck: no JVD,  Cardiovascular: S1 and S2 Present, no Murmur,  Respiratory: good respiratory effort, Bilateral Air entry equal and Decreased, mild Crackles, no wheezes Abdomen: Bowel Sound present, Soft and no tenderness,  Skin: no rashes Extremities: no Pedal edema, no calf tenderness Neurologic: without any new focal findings Gait not checked due to patient safety concerns  Vitals:   10/02/24 0757 10/02/24 1143 10/02/24 1353 10/02/24 1554  BP:  (!) 164/58 (!) 144/50 (!) 152/54  Pulse:  65 74 73  Resp:  18  16  Temp:  97.6 F (36.4 C)  98.2 F (36.8 C)  TempSrc:      SpO2: 95% 97% 98% 97%  Weight:      Height:        Intake/Output Summary (Last 24 hours) at 10/02/2024 1605 Last data filed at 10/02/2024 1300 Gross per 24 hour  Intake 663 ml  Output --  Net 663 ml   Filed Weights   10/01/24 0326 10/01/24 0809 10/02/24 0500  Weight: 84.8 kg 86.9 kg 86.1 kg    Data Reviewed: I  have personally reviewed and interpreted daily labs, tele strips, imagings as discussed above. I reviewed all nursing notes, pharmacy notes, vitals, pertinent old records I have discussed plan of care as described above with RN and patient/family.  CBC: Recent Labs  Lab 09/29/24 1006 10/01/24 0741  WBC 7.1 8.7  HGB 11.6* 10.9*  HCT 36.3 32.9*  MCV 90.5 89.2  PLT 146* 173   Basic Metabolic Panel: Recent  Labs  Lab 09/29/24 1006 09/30/24 0641 10/01/24 0459 10/01/24 0500  NA 140 139  --  137  K 3.5 3.8  --  4.2  CL 99 97*  --  99  CO2 24 28  --  24  GLUCOSE 188* 124*  --  118*  BUN 50* 37*  --  73*  CREATININE 3.23* 2.37*  --  3.03*  CALCIUM  9.4 10.2  --  9.6  PHOS  --   --  3.3  --     Studies: No results found.  Scheduled Meds:  allopurinol   100 mg Oral Daily   amLODipine   5 mg Oral Daily   apixaban   2.5 mg Oral BID   carvedilol   3.125 mg Oral BID WC   Chlorhexidine  Gluconate Cloth  6 each Topical Q0600   fluticasone  furoate-vilanterol  1 puff Inhalation Daily   guaiFENesin   600 mg Oral BID   hydrALAZINE   50 mg Oral Q8H   insulin  aspart  0-6 Units Subcutaneous TID WC   ipratropium-albuterol   3 mL Nebulization BID   levothyroxine   150 mcg Oral Q0600   montelukast   10 mg Oral QHS   pantoprazole   40 mg Oral Daily   rosuvastatin   40 mg Oral QPM   sertraline   100 mg Oral Daily   sodium chloride  flush  3 mL Intravenous Q12H   torsemide   40 mg Oral Daily   Continuous Infusions:  cefTRIAXone  (ROCEPHIN )  IV     PRN Meds: acetaminophen , albuterol , ondansetron  (ZOFRAN ) IV, sodium chloride  flush, zolpidem  Time spent: 35 minutes  Author: ELVAN SOR. MD [facility] 10/02/2024 4:05 PM  To reach On-call, see care teams to locate the attending and reach out to them via [email or web address]. If 7PM-7AM, please contact night-coverage If you still have difficulty reaching the attending provider, please page the [facility] (Director on Call) for [facility] on amion for assistance.

## 2024-10-02 NOTE — Plan of Care (Signed)
" °  Problem: Education: Goal: Knowledge of General Education information will improve Description: Including pain rating scale, medication(s)/side effects and non-pharmacologic comfort measures Outcome: Progressing   Problem: Clinical Measurements: Goal: Respiratory complications will improve Outcome: Progressing   Problem: Nutrition: Goal: Adequate nutrition will be maintained Outcome: Progressing   Problem: Safety: Goal: Ability to remain free from injury will improve Outcome: Progressing   Problem: Education: Goal: Ability to describe self-care measures that may prevent or decrease complications (Diabetes Survival Skills Education) will improve Outcome: Progressing   "

## 2024-10-03 DIAGNOSIS — I5033 Acute on chronic diastolic (congestive) heart failure: Secondary | ICD-10-CM | POA: Diagnosis not present

## 2024-10-03 LAB — CBC
HCT: 33.8 % — ABNORMAL LOW (ref 36.0–46.0)
Hemoglobin: 10.8 g/dL — ABNORMAL LOW (ref 12.0–15.0)
MCH: 28.4 pg (ref 26.0–34.0)
MCHC: 32 g/dL (ref 30.0–36.0)
MCV: 88.9 fL (ref 80.0–100.0)
Platelets: 188 K/uL (ref 150–400)
RBC: 3.8 MIL/uL — ABNORMAL LOW (ref 3.87–5.11)
RDW: 15.6 % — ABNORMAL HIGH (ref 11.5–15.5)
WBC: 8.9 K/uL (ref 4.0–10.5)
nRBC: 0 % (ref 0.0–0.2)

## 2024-10-03 LAB — BASIC METABOLIC PANEL WITH GFR
Anion gap: 14 (ref 5–15)
BUN: 95 mg/dL — ABNORMAL HIGH (ref 8–23)
CO2: 26 mmol/L (ref 22–32)
Calcium: 9.3 mg/dL (ref 8.9–10.3)
Chloride: 97 mmol/L — ABNORMAL LOW (ref 98–111)
Creatinine, Ser: 2.95 mg/dL — ABNORMAL HIGH (ref 0.44–1.00)
GFR, Estimated: 15 mL/min — ABNORMAL LOW
Glucose, Bld: 117 mg/dL — ABNORMAL HIGH (ref 70–99)
Potassium: 4 mmol/L (ref 3.5–5.1)
Sodium: 136 mmol/L (ref 135–145)

## 2024-10-03 LAB — GLUCOSE, CAPILLARY
Glucose-Capillary: 102 mg/dL — ABNORMAL HIGH (ref 70–99)
Glucose-Capillary: 164 mg/dL — ABNORMAL HIGH (ref 70–99)
Glucose-Capillary: 145 mg/dL — ABNORMAL HIGH (ref 70–99)
Glucose-Capillary: 124 mg/dL — ABNORMAL HIGH (ref 70–99)

## 2024-10-03 LAB — MAGNESIUM: Magnesium: 1.7 mg/dL (ref 1.7–2.4)

## 2024-10-03 MED ORDER — HEPARIN SODIUM (PORCINE) 1000 UNIT/ML DIALYSIS: 1000.0000 [IU] | INTRAMUSCULAR | Status: DC | PRN

## 2024-10-03 MED ORDER — PENTAFLUOROPROP-TETRAFLUOROETH EX AERO: 1.0000 | INHALATION_SPRAY | CUTANEOUS | Status: DC | PRN

## 2024-10-03 MED ORDER — LIDOCAINE-PRILOCAINE 2.5-2.5 % EX CREA: 1.0000 | TOPICAL_CREAM | CUTANEOUS | Status: DC | PRN

## 2024-10-03 MED ORDER — PENTAFLUOROPROP-TETRAFLUOROETH EX AERO
INHALATION_SPRAY | CUTANEOUS | Status: AC
  Filled 2024-10-03: qty 30

## 2024-10-03 MED ORDER — IPRATROPIUM-ALBUTEROL 0.5-2.5 (3) MG/3ML IN SOLN
3.0000 mL | Freq: Four times a day (QID) | RESPIRATORY_TRACT | Status: DC | PRN
  Administered 2024-10-04: 3 mL via RESPIRATORY_TRACT
  Filled 2024-10-03: qty 3

## 2024-10-03 NOTE — Plan of Care (Signed)

## 2024-10-03 NOTE — Progress Notes (Signed)
 " [facility]  ROUNDING NOTE   Subjective:   [name] is well known to our practice and receives dialysis at [facility] on a MWF schedule. 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] Depression, unspecified depression type [F32.A] Chronic obstructive pulmonary disease, unspecified COPD type (HCC) [J44.9]  Update Patient seen and evaluated during dialysis   HEMODIALYSIS FLOWSHEET:  Blood Flow Rate (mL/min): 349 mL/min Arterial Pressure (mmHg): -208.88 mmHg Venous Pressure (mmHg): 185.24 mmHg TMP (mmHg): 5.45 mmHg Ultrafiltration Rate (mL/min): 0 mL/min Dialysate Flow Rate (mL/min): 300 ml/min Dialysis Fluid Bolus: Normal Saline Bolus Amount (mL): 100 mL  States she experienced some heart fluttering last night    Objective:  Vital signs in last 24 hours:  Temp:  [97.9 F (36.6 C)-99.5 F (37.5 C)] 98.2 F (36.8 C) (01/02 0925) Pulse Rate:  [61-81] 69 (01/02 1230) Resp:  [16-25] 24 (01/02 1230) BP: (126-174)/(48-63) 126/61 (01/02 1230) SpO2:  [95 %-100 %] 99 % (01/02 1230) FiO2 (%):  [28 %] 28 % (01/02 0950) Weight:  [78.2 kg] 78.2 kg (01/02 0925)  Weight change:  Filed Weights   10/01/24 0809 10/02/24 0500 10/03/24 0925  Weight: 86.9 kg 86.1 kg 78.2 kg    Intake/Output: I/O last 3 completed shifts: In: 763 [P.O.:660; I.V.:3; IV Piggyback:100] Out: 1 [Urine:1]   Intake/Output this shift:  No intake/output data recorded.  Physical Exam: General: NAD  Head: Normocephalic, atraumatic. Moist oral mucosal membranes  Eyes: Anicteric  Lungs:  Clear, cough  Heart: Regular rate and rhythm  Abdomen:  Soft, nontender  Extremities:  No peripheral edema.  Neurologic: Awake, alert, conversant  Skin: Warm,dry, no  rash  Access: Lt upper AVF    Basic Metabolic Panel: Recent Labs  Lab 09/29/24 1006 09/30/24 0641 10/01/24 0459 10/01/24 0500 10/03/24 0552  NA 140 139  --  137 136  K 3.5 3.8  --  4.2 4.0  CL 99 97*  --  99 97*  CO2 24 28  --  24 26  GLUCOSE 188* 124*  --  118* 117*  BUN 50* 37*  --  73* 95*  CREATININE 3.23* 2.37*  --  3.03* 2.95*  CALCIUM  9.4 10.2  --  9.6 9.3  MG  --   --   --   --  1.7  PHOS  --   --  3.3  --   --     Liver Function Tests: No results for input(s): AST, ALT, ALKPHOS, BILITOT, PROT, ALBUMIN in the last 168 hours. No results for input(s): LIPASE, AMYLASE in the last 168 hours. No results for input(s): AMMONIA in the last 168 hours.  CBC: Recent Labs  Lab 09/29/24 1006 10/01/24 0741 10/03/24 0552  WBC 7.1 8.7 8.9  HGB 11.6* 10.9* 10.8*  HCT 36.3 32.9* 33.8*  MCV 90.5 89.2 88.9  PLT 146* 173 188    Cardiac Enzymes: No results for input(s): CKTOTAL, CKMB, CKMBINDEX, TROPONINI in the last 168 hours.  BNP: Invalid input(s): POCBNP  CBG: Recent Labs  Lab 10/02/24 1144 10/02/24 1556 10/02/24 2138 10/03/24 0818 10/03/24 1232  GLUCAP 232* 247* 147* 102* 164*    Microbiology: Results for orders placed or performed during the hospital encounter of 09/29/24  SARS Coronavirus 2 by RT PCR (hospital order, performed in [facility] hospital lab) *cepheid  single result test* Anterior Nasal Swab     Status: None   Collection Time: 09/29/24  2:13 PM   Specimen: Anterior Nasal Swab  Result Value Ref Range Status   SARS Coronavirus 2 by RT PCR NEGATIVE NEGATIVE Final    Comment: (NOTE) SARS-CoV-2 target nucleic acids are NOT DETECTED.  The SARS-CoV-2 RNA is generally detectable in upper and lower respiratory specimens during the acute phase of infection. The lowest concentration of SARS-CoV-2 viral copies this assay can detect is 250 copies / mL. A negative result does not preclude SARS-CoV-2 infection and should not  be used as the sole basis for treatment or other patient management decisions.  A negative result may occur with improper specimen collection / handling, submission of specimen other than nasopharyngeal swab, presence of viral mutation(s) within the areas targeted by this assay, and inadequate number of viral copies (<250 copies / mL). A negative result must be combined with clinical observations, patient history, and epidemiological information.  Fact Sheet for Patients:   roadlaptop.co.za  Fact Sheet for Healthcare Providers: http://kim-miller.com/  This test is not yet approved or  cleared by the [place]  FDA and has been authorized for detection and/or diagnosis of SARS-CoV-2 by FDA under an Emergency Use Authorization (EUA).  This EUA will remain in effect (meaning this test can be used) for the duration of the COVID-19 declaration under Section 564(b)(1) of the Act, 21 U.S.C. section 360bbb-3(b)(1), unless the authorization is terminated or revoked sooner.  Performed at [facility], [address] [place], [place] [address]   Resp panel by RT-PCR (RSV, Flu A&B, Covid) Anterior Nasal Swab     Status: None   Collection Time: 09/30/24  1:24 AM   Specimen: Anterior Nasal Swab  Result Value Ref Range Status   SARS Coronavirus 2 by RT PCR NEGATIVE NEGATIVE Final    Comment: (NOTE) SARS-CoV-2 target nucleic acids are NOT DETECTED.  The SARS-CoV-2 RNA is generally detectable in upper respiratory specimens during the acute phase of infection. The lowest concentration of SARS-CoV-2 viral copies this assay can detect is 138 copies/mL. A negative result does not preclude SARS-Cov-2 infection and should not be used as the sole basis for treatment or other patient management decisions. A negative result may occur with  improper specimen collection/handling, submission of specimen other than nasopharyngeal swab, presence of  viral mutation(s) within the areas targeted by this assay, and inadequate number of viral copies(<138 copies/mL). A negative result must be combined with clinical observations, patient history, and epidemiological information. The expected result is Negative.  Fact Sheet for Patients:  bloggercourse.com  Fact Sheet for Healthcare Providers:  seriousbroker.it  This test is no t yet approved or cleared by the [place]  FDA and  has been authorized for detection and/or diagnosis of SARS-CoV-2 by FDA under an Emergency Use Authorization (EUA). This EUA will remain  in effect (meaning this test can be used) for the duration of the COVID-19 declaration under Section 564(b)(1) of the Act, 21 U.S.C.section 360bbb-3(b)(1), unless the authorization is terminated  or revoked sooner.       Influenza A by PCR NEGATIVE NEGATIVE Final   Influenza B by PCR NEGATIVE NEGATIVE Final    Comment: (NOTE) The Xpert Xpress SARS-CoV-2/FLU/RSV plus assay is intended as an aid in the diagnosis of influenza from Nasopharyngeal swab specimens and should not be used as a sole basis for treatment. Nasal washings and aspirates are unacceptable for Xpert Xpress SARS-CoV-2/FLU/RSV testing.  Fact Sheet for  Patients: bloggercourse.com  Fact Sheet for Healthcare Providers: seriousbroker.it  This test is not yet approved or cleared by the [place]  FDA and has been authorized for detection and/or diagnosis of SARS-CoV-2 by FDA under an Emergency Use Authorization (EUA). This EUA will remain in effect (meaning this test can be used) for the duration of the COVID-19 declaration under Section 564(b)(1) of the Act, 21 U.S.C. section 360bbb-3(b)(1), unless the authorization is terminated or revoked.     Resp Syncytial Virus by PCR NEGATIVE NEGATIVE Final    Comment: (NOTE) Fact Sheet for  Patients: bloggercourse.com  Fact Sheet for Healthcare Providers: seriousbroker.it  This test is not yet approved or cleared by the [place]  FDA and has been authorized for detection and/or diagnosis of SARS-CoV-2 by FDA under an Emergency Use Authorization (EUA). This EUA will remain in effect (meaning this test can be used) for the duration of the COVID-19 declaration under Section 564(b)(1) of the Act, 21 U.S.C. section 360bbb-3(b)(1), unless the authorization is terminated or revoked.  Performed at [facility], [address] [place], [place] [address]     Coagulation Studies: No results for input(s): LABPROT, INR in the last 72 hours.  Urinalysis: Recent Labs    10/01/24 2108  COLORURINE YELLOW*  LABSPEC 1.011  PHURINE 6.0  GLUCOSEU NEGATIVE  HGBUR NEGATIVE  BILIRUBINUR NEGATIVE  KETONESUR NEGATIVE  PROTEINUR 100*  NITRITE NEGATIVE  LEUKOCYTESUR SMALL*      Imaging: No results found.    Medications:    cefTRIAXone  (ROCEPHIN )  IV Stopped (10/02/24 1859)     allopurinol   100 mg Oral Daily   amLODipine   5 mg Oral Daily   apixaban   2.5 mg Oral BID   carvedilol   3.125 mg Oral BID WC   Chlorhexidine  Gluconate Cloth  6 each Topical Q0600   fluticasone  furoate-vilanterol  1 puff Inhalation Daily   guaiFENesin   600 mg Oral BID   hydrALAZINE   50 mg Oral Q8H   insulin  aspart  0-6 Units Subcutaneous TID WC   levothyroxine   150 mcg Oral Q0600   montelukast   10 mg Oral QHS   pantoprazole   40 mg Oral Daily   rosuvastatin   40 mg Oral QPM   sertraline   100 mg Oral Daily   sodium chloride  flush  3 mL Intravenous Q12H   torsemide   40 mg Oral Daily   acetaminophen , heparin , ipratropium-albuterol , lidocaine -prilocaine , ondansetron  (ZOFRAN ) IV, pentafluoroprop-tetrafluoroeth, sodium chloride  flush, zolpidem  Assessment/ Plan:  [name] is a 84 y.o.  female is well known to our practice  and receives dialysis at [facility] on a MWF schedule. 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 On 2L [place]  2. End stage renal disease on hemodialysis. Receiving dialysis today, UF 0.5L as tolerated. Next treatment scheduled for Monday  3. Anemia of chronic kidney disease Lab Results  Component Value Date   HGB 10.8 (L) 10/03/2024    No need for ESA at this time  4. Secondary Hyperparathyroidism: with outpatient labs: Unavailable  Lab Results  Component Value Date   PTH 130.0 08/11/2024   CALCIUM  9.3 10/03/2024   PHOS 3.3 10/01/2024    Bone minerals within  desired range   LOS: 0 [name] 1/2/202612:38 PM   "

## 2024-10-03 NOTE — Progress Notes (Signed)
" °  Received patient in bed to unit.   Informed consent signed and in chart.    TX duration: 3.5hrs     Transported back to floor  Hand-off given to patient's nurse. No c/o   no acute distress noted    Access used: L AVF Access issues: none   Total UF removed: 0.4L Medication(s) given: none Post HD VS: wnl      Olivia [name] LPN [facility]   "

## 2024-10-03 NOTE — Progress Notes (Signed)
 [facility] Progress Note  Patient: [name]    FMW:[id number]  DOA: 09/29/2024     Date of Service: the patient was seen and examined on 10/03/2024  Chief Complaint  Patient presents with   Shortness of Breath   Brief hospital course:  84 y.o. female with medical history significant of ESRD MWF, HTN/chronic HFpEF, PAF on Eliquis , HLD, IIDM, COPD Gold stage I, obesity, presented with worsening of cough and shortness of breath after having her hemodialysis schedule adjusted last week.  She has gotten less dialysis than usual.  Workup revealed interstitial edema on chest x-ray.  Patient was admitted for emergent hemodialysis.    Assessment and Plan:  Pulmonary edema secondary to missed dialysis ESRD Patient reserved emergency dialysis after admission and was dialyzed again today Per nephrology note, unable to tolerate ultrafiltration due to muscle cramps Plan is to dialyze patient on Friday Patient does not feel like she can go home because she is so short of breath Continue torsemide  40 mg daily Influenza and COVID swabs are negative   COPD Patient was diagnosed with acute exacerbation, treated with steroids and inhaled bronchodilators,  1/1 DC'd IV Solu-Medrol , started Mucinex  600 mg p.o. twice daily Continue current inhaler   PAF Presently in sinus rhythm Continue Eliquis    Hypothyroidism Continue Synthroid    Anxiety and depression Continue home medication   UTI, UA positive Started ceftriaxone  1 g IV daily Follow urine culture    Body mass index is 31.53 kg/m.  Interventions:  Diet: Renal diet DVT Prophylaxis: Eliquis   Advance goals of care discussion: DNR-limited  Family Communication: family was not present at bedside, at the time of interview.  The pt provided permission to discuss medical plan with the family. Opportunity was given to ask question and all questions were answered satisfactorily.   Disposition:  Pt is from home, admitted with  shortness of breath due to volume overload and COPD, still has mild shortness of breath, which precludes a safe discharge. Discharge to home, when stable, most likely tomorrow a.m.   Subjective: No significant events overnight.  Patient was seen during hemodialysis, she was very tired and still has shortness of breath, does not feel comfortable going home today.   Physical Exam: General: NAD, lying comfortably Appear in no distress, affect appropriate Eyes: PERRLA ENT: Oral Mucosa Clear, moist  Neck: no JVD,  Cardiovascular: S1 and S2 Present, no Murmur,  Respiratory: good respiratory effort, Bilateral Air entry equal and Decreased, mild Crackles, no wheezes Abdomen: Bowel Sound present, Soft and no tenderness,  Skin: no rashes Extremities: no Pedal edema, no calf tenderness Neurologic: without any new focal findings Gait not checked due to patient safety concerns  Vitals:   10/03/24 1200 10/03/24 1230 10/03/24 1300 10/03/24 1321  BP: 130/63 126/61 (!) 144/47 (!) 146/50  Pulse: 74 69 70 69  Resp: 20 (!) 24 (!) 22 (!) 27  Temp:    98 F (36.7 C)  TempSrc:    Oral  SpO2: 98% 99% 100% 98%  Weight:      Height:        Intake/Output Summary (Last 24 hours) at 10/03/2024 1634 Last data filed at 10/03/2024 1321 Gross per 24 hour  Intake 340 ml  Output 401 ml  Net -61 ml   Filed Weights   10/01/24 0809 10/02/24 0500 10/03/24 0925  Weight: 86.9 kg 86.1 kg 78.2 kg    Data Reviewed: I have personally reviewed and interpreted daily labs, tele strips, imagings as discussed  above. I reviewed all nursing notes, pharmacy notes, vitals, pertinent old records I have discussed plan of care as described above with RN and patient/family.  CBC: Recent Labs  Lab 09/29/24 1006 10/01/24 0741 10/03/24 0552  WBC 7.1 8.7 8.9  HGB 11.6* 10.9* 10.8*  HCT 36.3 32.9* 33.8*  MCV 90.5 89.2 88.9  PLT 146* 173 188   Basic Metabolic Panel: Recent Labs  Lab 09/29/24 1006 09/30/24 0641  10/01/24 0459 10/01/24 0500 10/03/24 0552  NA 140 139  --  137 136  K 3.5 3.8  --  4.2 4.0  CL 99 97*  --  99 97*  CO2 24 28  --  24 26  GLUCOSE 188* 124*  --  118* 117*  BUN 50* 37*  --  73* 95*  CREATININE 3.23* 2.37*  --  3.03* 2.95*  CALCIUM  9.4 10.2  --  9.6 9.3  MG  --   --   --   --  1.7  PHOS  --   --  3.3  --   --     Studies: No results found.  Scheduled Meds:  allopurinol   100 mg Oral Daily   amLODipine   5 mg Oral Daily   apixaban   2.5 mg Oral BID   carvedilol   3.125 mg Oral BID WC   Chlorhexidine  Gluconate Cloth  6 each Topical Q0600   fluticasone  furoate-vilanterol  1 puff Inhalation Daily   guaiFENesin   600 mg Oral BID   hydrALAZINE   50 mg Oral Q8H   insulin  aspart  0-6 Units Subcutaneous TID WC   levothyroxine   150 mcg Oral Q0600   montelukast   10 mg Oral QHS   pantoprazole   40 mg Oral Daily   rosuvastatin   40 mg Oral QPM   sertraline   100 mg Oral Daily   sodium chloride  flush  3 mL Intravenous Q12H   torsemide   40 mg Oral Daily   Continuous Infusions:  cefTRIAXone  (ROCEPHIN )  IV Stopped (10/02/24 1859)   PRN Meds: acetaminophen , ipratropium-albuterol , ondansetron  (ZOFRAN ) IV, sodium chloride  flush, zolpidem  Time spent: 35 minutes  Author: ELVAN SOR. MD [facility] 10/03/2024 4:34 PM  To reach On-call, see care teams to locate the attending and reach out to them via [email or web address]. If 7PM-7AM, please contact night-coverage If you still have difficulty reaching the attending provider, please page the [facility] (Director on Call) for [facility] on amion for assistance.

## 2024-10-04 ENCOUNTER — Other Ambulatory Visit: Payer: Self-pay

## 2024-10-04 DIAGNOSIS — I5033 Acute on chronic diastolic (congestive) heart failure: Secondary | ICD-10-CM | POA: Diagnosis not present

## 2024-10-04 LAB — URINE CULTURE: Culture: >=100000 — AB

## 2024-10-04 LAB — GLUCOSE, CAPILLARY
Glucose-Capillary: 93 mg/dL (ref 70–99)
Glucose-Capillary: 99 mg/dL (ref 70–99)

## 2024-10-04 MED ORDER — AMOXICILLIN-POT CLAVULANATE 500-125 MG PO TABS
1.0000 | ORAL_TABLET | Freq: Every day | ORAL | 0 refills | Status: AC
  Filled 2024-10-04: qty 5, 5d supply, fill #0

## 2024-10-04 MED ORDER — HYDRALAZINE HCL 50 MG PO TABS
50.0000 mg | ORAL_TABLET | Freq: Three times a day (TID) | ORAL | 11 refills | Status: AC
  Filled 2024-10-04: qty 90, 30d supply, fill #0

## 2024-10-04 NOTE — Progress Notes (Signed)
 MD order received in [facility] to discharge pt home today; [facility] previously delivered Rxs (escribed to them) to the pt in Room 114; verbally reviewed AVS with pt, no questions voiced at this time; verbally instructed pt to use the nurse callbell when her ride is at the [facility] entrance in order for nursing to take her to the [facility] entrance

## 2024-10-04 NOTE — Plan of Care (Signed)

## 2024-10-04 NOTE — Progress Notes (Signed)
 " [facility]  ROUNDING NOTE   Subjective:   [name] is well known to our practice and receives dialysis at [facility] on a MWF schedule. 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] Depression, unspecified depression type [F32.A] Chronic obstructive pulmonary disease, unspecified COPD type (HCC) [J44.9]  Update Patient seen resting in bed Appears tachypneic however just returned from bathroom Reports shortness of breath overnight requiring breathing treatment Remains on 2 L nasal cannula, requesting room air trial  Objective:  Vital signs in last 24 hours:  Temp:  [97.3 F (36.3 C)-98 F (36.7 C)] 97.3 F (36.3 C) (01/03 0808) Pulse Rate:  [60-74] 64 (01/03 0808) Resp:  [16-27] 16 (01/03 0808) BP: (126-174)/(42-68) 163/51 (01/03 0808) SpO2:  [94 %-100 %] 99 % (01/03 0808) Weight:  [87 kg] 87 kg (01/03 0500)  Weight change:  Filed Weights   10/02/24 0500 10/03/24 0925 10/04/24 0500  Weight: 86.1 kg 78.2 kg 87 kg    Intake/Output: I/O last 3 completed shifts: In: 343 [P.O.:240; I.V.:3; IV Piggyback:100] Out: 401 [Urine:1; Other:400]   Intake/Output this shift:  Total I/O In: 240 [P.O.:240] Out: -   Physical Exam: General: NAD  Head: Normocephalic, atraumatic. Moist oral mucosal membranes  Eyes: Anicteric  Lungs:  Clear, cough, 2 L   Heart: Regular rate and rhythm  Abdomen:  Soft, nontender  Extremities:  No peripheral edema.  Neurologic: Awake, alert, conversant  Skin: Warm,dry, no rash  Access: Lt upper AVF    Basic Metabolic Panel: Recent Labs  Lab 09/29/24 1006 09/30/24 0641 10/01/24 0459 10/01/24 0500 10/03/24 0552  NA 140 139  --  137 136  K 3.5 3.8  --  4.2 4.0  CL 99 97*  --  99 97*   CO2 24 28  --  24 26  GLUCOSE 188* 124*  --  118* 117*  BUN 50* 37*  --  73* 95*  CREATININE 3.23* 2.37*  --  3.03* 2.95*  CALCIUM  9.4 10.2  --  9.6 9.3  MG  --   --   --   --  1.7  PHOS  --   --  3.3  --   --     Liver Function Tests: No results for input(s): AST, ALT, ALKPHOS, BILITOT, PROT, ALBUMIN in the last 168 hours. No results for input(s): LIPASE, AMYLASE in the last 168 hours. No results for input(s): AMMONIA in the last 168 hours.  CBC: Recent Labs  Lab 09/29/24 1006 10/01/24 0741 10/03/24 0552  WBC 7.1 8.7 8.9  HGB 11.6* 10.9* 10.8*  HCT 36.3 32.9* 33.8*  MCV 90.5 89.2 88.9  PLT 146* 173 188    Cardiac Enzymes: No results for input(s): CKTOTAL, CKMB, CKMBINDEX, TROPONINI in the last 168 hours.  BNP: Invalid input(s): POCBNP  CBG: Recent Labs  Lab 10/03/24 0818 10/03/24 1232 10/03/24 1637 10/03/24 2146 10/04/24 0808  GLUCAP 102* 164* 145* 124* 93    Microbiology: Results for orders placed or performed during the hospital encounter of 09/29/24  SARS Coronavirus 2 by RT PCR (hospital order, performed in [facility] hospital lab) *cepheid single result test* Anterior Nasal Swab     Status: None   Collection Time: 09/29/24  2:13 PM   Specimen: Anterior Nasal Swab  Result Value Ref Range Status  SARS Coronavirus 2 by RT PCR NEGATIVE NEGATIVE Final    Comment: (NOTE) SARS-CoV-2 target nucleic acids are NOT DETECTED.  The SARS-CoV-2 RNA is generally detectable in upper and lower respiratory specimens during the acute phase of infection. The lowest concentration of SARS-CoV-2 viral copies this assay can detect is 250 copies / mL. A negative result does not preclude SARS-CoV-2 infection and should not be used as the sole basis for treatment or other patient management decisions.  A negative result may occur with improper specimen collection / handling, submission of specimen other than nasopharyngeal swab, presence of viral  mutation(s) within the areas targeted by this assay, and inadequate number of viral copies (<250 copies / mL). A negative result must be combined with clinical observations, patient history, and epidemiological information.  Fact Sheet for Patients:   roadlaptop.co.za  Fact Sheet for Healthcare Providers: http://kim-miller.com/  This test is not yet approved or  cleared by the [place]  FDA and has been authorized for detection and/or diagnosis of SARS-CoV-2 by FDA under an Emergency Use Authorization (EUA).  This EUA will remain in effect (meaning this test can be used) for the duration of the COVID-19 declaration under Section 564(b)(1) of the Act, 21 U.S.C. section 360bbb-3(b)(1), unless the authorization is terminated or revoked sooner.  Performed at [facility], [address] [place], [place] [address]   Resp panel by RT-PCR (RSV, Flu A&B, Covid) Anterior Nasal Swab     Status: None   Collection Time: 09/30/24  1:24 AM   Specimen: Anterior Nasal Swab  Result Value Ref Range Status   SARS Coronavirus 2 by RT PCR NEGATIVE NEGATIVE Final    Comment: (NOTE) SARS-CoV-2 target nucleic acids are NOT DETECTED.  The SARS-CoV-2 RNA is generally detectable in upper respiratory specimens during the acute phase of infection. The lowest concentration of SARS-CoV-2 viral copies this assay can detect is 138 copies/mL. A negative result does not preclude SARS-Cov-2 infection and should not be used as the sole basis for treatment or other patient management decisions. A negative result may occur with  improper specimen collection/handling, submission of specimen other than nasopharyngeal swab, presence of viral mutation(s) within the areas targeted by this assay, and inadequate number of viral copies(<138 copies/mL). A negative result must be combined with clinical observations, patient history, and epidemiological information.  The expected result is Negative.  Fact Sheet for Patients:  bloggercourse.com  Fact Sheet for Healthcare Providers:  seriousbroker.it  This test is no t yet approved or cleared by the [place]  FDA and  has been authorized for detection and/or diagnosis of SARS-CoV-2 by FDA under an Emergency Use Authorization (EUA). This EUA will remain  in effect (meaning this test can be used) for the duration of the COVID-19 declaration under Section 564(b)(1) of the Act, 21 U.S.C.section 360bbb-3(b)(1), unless the authorization is terminated  or revoked sooner.       Influenza A by PCR NEGATIVE NEGATIVE Final   Influenza B by PCR NEGATIVE NEGATIVE Final    Comment: (NOTE) The Xpert Xpress SARS-CoV-2/FLU/RSV plus assay is intended as an aid in the diagnosis of influenza from Nasopharyngeal swab specimens and should not be used as a sole basis for treatment. Nasal washings and aspirates are unacceptable for Xpert Xpress SARS-CoV-2/FLU/RSV testing.  Fact Sheet for Patients: bloggercourse.com  Fact Sheet for Healthcare Providers: seriousbroker.it  This test is not yet approved or cleared by the [place]  FDA and has been authorized for detection and/or diagnosis of SARS-CoV-2 by  FDA under an Emergency Use Authorization (EUA). This EUA will remain in effect (meaning this test can be used) for the duration of the COVID-19 declaration under Section 564(b)(1) of the Act, 21 U.S.C. section 360bbb-3(b)(1), unless the authorization is terminated or revoked.     Resp Syncytial Virus by PCR NEGATIVE NEGATIVE Final    Comment: (NOTE) Fact Sheet for Patients: bloggercourse.com  Fact Sheet for Healthcare Providers: seriousbroker.it  This test is not yet approved or cleared by the [place]  FDA and has been authorized for detection and/or  diagnosis of SARS-CoV-2 by FDA under an Emergency Use Authorization (EUA). This EUA will remain in effect (meaning this test can be used) for the duration of the COVID-19 declaration under Section 564(b)(1) of the Act, 21 U.S.C. section 360bbb-3(b)(1), unless the authorization is terminated or revoked.  Performed at [facility], [address], [place], [place] [address]   Urine Culture (for pregnant, neutropenic or urologic patients or patients with an indwelling urinary catheter)     Status: Abnormal   Collection Time: 10/02/24  9:25 AM   Specimen: Urine, Clean Catch  Result Value Ref Range Status   Specimen Description   Final    URINE, CLEAN CATCH Performed at [facility], [address] [place], [place] [address]    Special Requests   Final    NONE Performed at [facility], [address] [place], [place] [address]    Culture >=100,000 COLONIES/mL KLEBSIELLA PNEUMONIAE (A)  Final   Report Status 10/04/2024 FINAL  Final   Organism ID, Bacteria KLEBSIELLA PNEUMONIAE (A)  Final      Susceptibility   Klebsiella pneumoniae - MIC*    AMPICILLIN  RESISTANT Resistant     CEFAZOLIN  (URINE) Value in next row Sensitive      2 SENSITIVEThis is a modified FDA-approved test that has been validated and its performance characteristics determined by the reporting laboratory.  This laboratory is certified under the Clinical Laboratory Improvement Amendments CLIA as qualified to perform high complexity clinical laboratory testing.    CEFEPIME Value in next row Sensitive      2 SENSITIVEThis is a modified FDA-approved test that has been validated and its performance characteristics determined by the reporting laboratory.  This laboratory is certified under the Clinical Laboratory Improvement Amendments CLIA as qualified to perform high complexity clinical laboratory testing.    ERTAPENEM Value in next row Sensitive      2 SENSITIVEThis is a modified FDA-approved  test that has been validated and its performance characteristics determined by the reporting laboratory.  This laboratory is certified under the Clinical Laboratory Improvement Amendments CLIA as qualified to perform high complexity clinical laboratory testing.    CEFTRIAXONE  Value in next row Sensitive      2 SENSITIVEThis is a modified FDA-approved test that has been validated and its performance characteristics determined by the reporting laboratory.  This laboratory is certified under the Clinical Laboratory Improvement Amendments CLIA as qualified to perform high complexity clinical laboratory testing.    CIPROFLOXACIN  Value in next row Sensitive      2 SENSITIVEThis is a modified FDA-approved test that has been validated and its performance characteristics determined by the reporting laboratory.  This laboratory is certified under the Clinical Laboratory Improvement Amendments CLIA as qualified to perform high complexity clinical laboratory testing.    GENTAMICIN Value in next row Sensitive      2 SENSITIVEThis is a modified FDA-approved test that has been validated and its performance characteristics  determined by the reporting laboratory.  This laboratory is certified under the Clinical Laboratory Improvement Amendments CLIA as qualified to perform high complexity clinical laboratory testing.    NITROFURANTOIN  Value in next row Resistant      2 SENSITIVEThis is a modified FDA-approved test that has been validated and its performance characteristics determined by the reporting laboratory.  This laboratory is certified under the Clinical Laboratory Improvement Amendments CLIA as qualified to perform high complexity clinical laboratory testing.    TRIMETH/SULFA Value in next row Sensitive      2 SENSITIVEThis is a modified FDA-approved test that has been validated and its performance characteristics determined by the reporting laboratory.  This laboratory is certified under the Clinical Laboratory  Improvement Amendments CLIA as qualified to perform high complexity clinical laboratory testing.    AMPICILLIN /SULBACTAM Value in next row Sensitive      2 SENSITIVEThis is a modified FDA-approved test that has been validated and its performance characteristics determined by the reporting laboratory.  This laboratory is certified under the Clinical Laboratory Improvement Amendments CLIA as qualified to perform high complexity clinical laboratory testing.    PIP/TAZO Value in next row Sensitive      <=4 SENSITIVEThis is a modified FDA-approved test that has been validated and its performance characteristics determined by the reporting laboratory.  This laboratory is certified under the Clinical Laboratory Improvement Amendments CLIA as qualified to perform high complexity clinical laboratory testing.    MEROPENEM Value in next row Sensitive      <=4 SENSITIVEThis is a modified FDA-approved test that has been validated and its performance characteristics determined by the reporting laboratory.  This laboratory is certified under the Clinical Laboratory Improvement Amendments CLIA as qualified to perform high complexity clinical laboratory testing.    * >=100,000 COLONIES/mL KLEBSIELLA PNEUMONIAE    Coagulation Studies: No results for input(s): LABPROT, INR in the last 72 hours.  Urinalysis: Recent Labs    10/01/24 2108  COLORURINE YELLOW*  LABSPEC 1.011  PHURINE 6.0  GLUCOSEU NEGATIVE  HGBUR NEGATIVE  BILIRUBINUR NEGATIVE  KETONESUR NEGATIVE  PROTEINUR 100*  NITRITE NEGATIVE  LEUKOCYTESUR SMALL*      Imaging: No results found.    Medications:    cefTRIAXone  (ROCEPHIN )  IV 1 g (10/03/24 1737)     allopurinol   100 mg Oral Daily   amLODipine   5 mg Oral Daily   apixaban   2.5 mg Oral BID   carvedilol   3.125 mg Oral BID WC   Chlorhexidine  Gluconate Cloth  6 each Topical Q0600   fluticasone  furoate-vilanterol  1 puff Inhalation Daily   guaiFENesin   600 mg Oral BID    hydrALAZINE   50 mg Oral Q8H   insulin  aspart  0-6 Units Subcutaneous TID WC   levothyroxine   150 mcg Oral Q0600   montelukast   10 mg Oral QHS   pantoprazole   40 mg Oral Daily   rosuvastatin   40 mg Oral QPM   sertraline   100 mg Oral Daily   sodium chloride  flush  3 mL Intravenous Q12H   torsemide   40 mg Oral Daily   acetaminophen , ipratropium-albuterol , ondansetron  (ZOFRAN ) IV, sodium chloride  flush, zolpidem   Assessment/ Plan:  [name] is a 84 y.o.  female is well known to our practice and receives dialysis at [facility] on a MWF schedule. 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  Remains on 2 L [place].  Required breathing treatment overnight.  2. End stage renal disease on hemodialysis.  Received dialysis yesterday with UF 400 mL achieved.  Next treatment scheduled for Monday.  Will defer discharge to primary team  3. Anemia of chronic kidney disease Lab Results  Component Value Date   HGB 10.8 (L) 10/03/2024    No need for ESA at this time  4. Secondary Hyperparathyroidism: with outpatient labs: Unavailable  Lab Results  Component Value Date   PTH 130.0 08/11/2024   CALCIUM  9.3 10/03/2024   PHOS 3.3 10/01/2024    Calcium  remains within optimal range.     LOS: 0 Ajay Strubel 1/3/202611:22 AM   "

## 2024-10-04 NOTE — Discharge Summary (Signed)
 [facility] Discharge Summary   Patient: [name] FMW:[id number]  PCP: Ostwalt, Janna, PA-C  Date of admission: 09/29/2024   Date of discharge:  10/04/2024     Discharge Diagnoses:  Principal Problem:   CHF (congestive heart failure) (HCC) Active Problems:   Acute on chronic diastolic CHF (congestive heart failure) (HCC)   Acute respiratory failure with hypoxia (HCC)   Admitted From: Home Disposition:  Home   Recommendations for Outpatient Follow-up:  Follow-up with PCP in 1 week, continue to monitor BP at home and follow with PCP to titrate medication accordingly.  Increased hydralazine  50 mg p.o. 3 times daily, hold off if systolic BP less than 140 mmHg. Follow-up with nephrology and continue hemodialysis MWF schedule. Follow up LABS/TEST:     Follow-up Information     Ostwalt, Janna, PA-C Follow up.   Specialty: Physician Assistant Why: hospital follow up Contact information: [address] [place] [address] [phone]                Diet recommendation: Renal diet  Activity: The patient is advised to gradually reintroduce usual activities, as tolerated  Discharge Condition: stable  Code Status: DNR-limited  History of present illness: As per the H and P dictated on admission.  Hospital Course:  84 y.o. female with medical history significant of ESRD MWF, HTN/chronic HFpEF, PAF on Eliquis , HLD, IIDM, COPD Gold stage I, obesity, presented with worsening of cough and shortness of breath after having her hemodialysis schedule adjusted last week.  She has gotten less dialysis than usual.  Workup revealed interstitial edema on chest x-ray.  Patient was admitted for emergent hemodialysis.      Assessment and Plan:  # Pulmonary edema secondary to missed dialysis # ESRD on HD MWF schedule Patient reserved emergency dialysis after admission and was dialyzed again on the next day. Per nephrology note, unable to tolerate ultrafiltration due to  muscle cramps.  Patient got dialyzed again on Friday as per schedule Patient does not feel like she can go home because she is so short of breath Continue torsemide  40 mg daily. Influenza and COVID swabs are negative 1/3 today patient is feeling better, agreed for discharge planning.  Advised to continue home meds and follow with nephrology continue hemodialysis as per schedule.    # COPD:  Patient was diagnosed with acute exacerbation, treated with steroids and inhaled bronchodilators,  1/1 DC'd IV Solu-Medrol , started Mucinex  600 mg p.o. twice daily Continue current inhaler   # PAF: Presently in sinus rhythm. Continue Eliquis  # Hypothyroidism: Continue Synthroid  # Anxiety and depression: Continue home medication # UTI, UA positive, s/p Ceftriaxone  1 g IV daily. Urine culture growing Klebsiella, pansensitive.  Patient was discharged on Augmentin  500 mg p.o. daily for 5 days, renal dose.  Advised to take after hemodialysis on dialysis days.   Body mass index is 35.08 kg/m.  Nutrition Interventions:  Patient was ambulatory without any assistance.  On the day of the discharge the patient's vitals were stable, and no other acute medical condition were reported by patient. the patient was felt safe to be discharge at Home.  Consultants: Nephrology Procedures: None  Discharge Exam: General: Appear in no distress, Oral Mucosa Clear, moist. Cardiovascular: S1 and S2 Present, no Murmur, Respiratory: normal respiratory effort, Bilateral Air entry present and no Crackles, no wheezes Abdomen: Bowel Sound present, Soft and no tenderness. Extremities: no Pedal edema, no calf tenderness Neurology: alert and oriented to time, place, and person affect appropriate.  Filed Weights   10/02/24 0500 10/03/24 0925 10/04/24 0500  Weight: 86.1 kg 78.2 kg 87 kg   Vitals:   10/04/24 0808 10/04/24 1208  BP: (!) 163/51 121/62  Pulse: 64 67  Resp: 16 17  Temp: (!) 97.3 F (36.3 C) 98.1 F (36.7 C)   SpO2: 99% 96%    DISCHARGE MEDICATION: Allergies as of 10/04/2024       Reactions   Ace Inhibitors    Other reaction(s): Unknown   Egg Protein-containing Drug Products Diarrhea   Other    Other reaction(s): Other (See Comments) Eggs   Prednisone     Other reaction(s): Other (See Comments) joint pain   Risedronate    Other reaction(s): Other (See Comments)   Sulfa Antibiotics Itching, Swelling   Other reaction(s): Other (See Comments)   Sulfasalazine Other (See Comments)        Medication List     TAKE these medications    albuterol  108 (90 Base) MCG/ACT inhaler Commonly known as: VENTOLIN  HFA Inhale 2 puffs into the lungs every 6 (six) hours as needed for wheezing or shortness of breath.   allopurinol  100 MG tablet Commonly known as: ZYLOPRIM  Take 1 tablet (100 mg total) by mouth daily.   amLODipine  5 MG tablet Commonly known as: NORVASC  TAKE 1 TABLET BY MOUTH DAILY.   amoxicillin -clavulanate 500-125 MG tablet Commonly known as: Augmentin  Take 1 tablet by mouth daily for 5 days. Take after the hemodialysis on hemodialysis days   apixaban  2.5 MG Tabs tablet Commonly known as: ELIQUIS  Take 1 tablet (2.5 mg total) by mouth 2 (two) times daily.   Calcium  600 600 MG Tabs tablet Generic drug: calcium  carbonate Take 600 mg by mouth daily.   epoetin  alfa-epbx 4000 UNIT/ML injection Commonly known as: RETACRIT  Inject 1 mL (4,000 Units total) into the vein every Monday, Wednesday, and Friday at 6 PM.   esomeprazole  40 MG capsule Commonly known as: NEXIUM  Take 1 capsule (40 mg total) by mouth daily before breakfast.   ferrous sulfate  325 (65 FE) MG tablet Take 325 mg by mouth daily with breakfast.   fluticasone  furoate-vilanterol 200-25 MCG/ACT Aepb Commonly known as: Breo Ellipta  Inhale 1 puff into the lungs daily.   folic acid  1 MG tablet Commonly known as: FOLVITE  Take 1 tablet (1 mg total) by mouth daily.   hydrALAZINE  50 MG tablet Commonly known as:  APRESOLINE  Take 1 tablet (50 mg total) by mouth 3 (three) times daily. Hold off if systolic BP less than 140 mmHg What changed:  medication strength how much to take when to take this additional instructions   ipratropium-albuterol  0.5-2.5 (3) MG/3ML Soln Commonly known as: DUONEB INHALE THE CONTENTS OF ONE (1) VIAL VIA NEBULIZATION EVERY 6 HOURS AS NEEDED FORSHORTNESS OF BREATH   levothyroxine  150 MCG tablet Commonly known as: SYNTHROID  TAKE 1 TABLET BY MOUTH ONCE DAILY. TAKE ON AN EMPTY STOMACH WITH A GLASS OF WATER AT LEAST 30-60 MINUTES BEFORE BREAKFAST   lidocaine -prilocaine  cream Commonly known as: EMLA  as directed. Before dialysis   metoprolol  tartrate 25 MG tablet Commonly known as: LOPRESSOR  Take 0.5 tablets (12.5 mg total) by mouth 2 (two) times daily.   montelukast  10 MG tablet Commonly known as: SINGULAIR  TAKE 1 TABLET BY MOUTH NIGHTLY   nystatin powder Apply 1 Application topically 3 (three) times daily as needed (itching).   rosuvastatin  40 MG tablet Commonly known as: CRESTOR  TAKE ONE TABLET BY MOUTH EVERY EVENING   sertraline  100 MG tablet Commonly known as: ZOLOFT   TAKE 1 TABLET BY MOUTH DAILY   torsemide  20 MG tablet Commonly known as: DEMADEX  TAKE TWO TABLETS BY MOUTH ONCE DAILY       Allergies[1] Discharge Instructions     Call MD for:  difficulty breathing, headache or visual disturbances   Complete by: As directed    Call MD for:  extreme fatigue   Complete by: As directed    Call MD for:  persistant dizziness or light-headedness   Complete by: As directed    Call MD for:  persistant nausea and vomiting   Complete by: As directed    Call MD for:  severe uncontrolled pain   Complete by: As directed    Call MD for:  temperature >100.4   Complete by: As directed    Discharge instructions   Complete by: As directed    Follow-up with PCP in 1 week, continue to monitor BP at home and follow with PCP to titrate medication accordingly.   Increased hydralazine  50 mg p.o. 3 times daily, hold off if systolic BP less than 140 mmHg. Follow-up with nephrology and continue hemodialysis MWF schedule.   Increase activity slowly   Complete by: As directed        The results of significant diagnostics from this hospitalization (including imaging, microbiology, ancillary and laboratory) are listed below for reference.    Significant Diagnostic Studies: DG Chest 1 View Result Date: 09/30/2024 EXAM: 1 VIEW(S) XRAY OF THE CHEST 09/30/2024 07:05:00 AM COMPARISON: 09/29/2024 CLINICAL HISTORY: CHF (congestive heart failure) (HCC) FINDINGS: LUNGS AND PLEURA: Mild interstitial prominence, similar to prior exam. No pleural effusion. No pneumothorax. HEART AND MEDIASTINUM: Stable cardiomegaly. Aortic atherosclerosis. BONES AND SOFT TISSUES: No acute osseous abnormality. IMPRESSION: 1. Stable cardiomegaly. 2. Aortic atherosclerosis. 3. Mild interstitial prominence, similar to prior exam. Electronically signed by: Evalene Coho MD 09/30/2024 07:15 AM EST RP Workstation: [id number]   DG Chest 2 View Result Date: 09/29/2024 EXAM: 2 VIEW(S) XRAY OF THE CHEST 09/29/2024 10:27:00 AM COMPARISON: 05/18/2024 CLINICAL HISTORY: sob sob FINDINGS: LINES, TUBES AND DEVICES: Interval removal of left dialysis catheter. LUNGS AND PLEURA: Interstitial prominence throughout the lungs could reflect interstitial edema. No pleural effusion. No pneumothorax. HEART AND MEDIASTINUM: Cardiomegaly. Aortic atherosclerosis. BONES AND SOFT TISSUES: No acute osseous abnormality. IMPRESSION: 1. Cardiomegaly. 2. Interstitial prominence throughout the lungs, which could reflect interstitial edema. Electronically signed by: Franky Crease MD 09/29/2024 12:32 PM EST RP Workstation: [id number]   PERIPHERAL VASCULAR CATHETERIZATION Result Date: 09/18/2024 See surgical note for result.   Microbiology: Recent Results (from the past 240 hours)  SARS Coronavirus 2 by RT PCR (hospital  order, performed in [facility] hospital lab) *cepheid single result test* Anterior Nasal Swab     Status: None   Collection Time: 09/29/24  2:13 PM   Specimen: Anterior Nasal Swab  Result Value Ref Range Status   SARS Coronavirus 2 by RT PCR NEGATIVE NEGATIVE Final    Comment: (NOTE) SARS-CoV-2 target nucleic acids are NOT DETECTED.  The SARS-CoV-2 RNA is generally detectable in upper and lower respiratory specimens during the acute phase of infection. The lowest concentration of SARS-CoV-2 viral copies this assay can detect is 250 copies / mL. A negative result does not preclude SARS-CoV-2 infection and should not be used as the sole basis for treatment or other patient management decisions.  A negative result may occur with improper specimen collection / handling, submission of specimen other than nasopharyngeal swab, presence of viral mutation(s) within the areas targeted by this assay, and  inadequate number of viral copies (<250 copies / mL). A negative result must be combined with clinical observations, patient history, and epidemiological information.  Fact Sheet for Patients:   roadlaptop.co.za  Fact Sheet for Healthcare Providers: http://kim-miller.com/  This test is not yet approved or  cleared by the [place]  FDA and has been authorized for detection and/or diagnosis of SARS-CoV-2 by FDA under an Emergency Use Authorization (EUA).  This EUA will remain in effect (meaning this test can be used) for the duration of the COVID-19 declaration under Section 564(b)(1) of the Act, 21 U.S.C. section 360bbb-3(b)(1), unless the authorization is terminated or revoked sooner.  Performed at [facility], [address] [place], [place] [address]   Resp panel by RT-PCR (RSV, Flu A&B, Covid) Anterior Nasal Swab     Status: None   Collection Time: 09/30/24  1:24 AM   Specimen: Anterior Nasal Swab  Result Value Ref Range  Status   SARS Coronavirus 2 by RT PCR NEGATIVE NEGATIVE Final    Comment: (NOTE) SARS-CoV-2 target nucleic acids are NOT DETECTED.  The SARS-CoV-2 RNA is generally detectable in upper respiratory specimens during the acute phase of infection. The lowest concentration of SARS-CoV-2 viral copies this assay can detect is 138 copies/mL. A negative result does not preclude SARS-Cov-2 infection and should not be used as the sole basis for treatment or other patient management decisions. A negative result may occur with  improper specimen collection/handling, submission of specimen other than nasopharyngeal swab, presence of viral mutation(s) within the areas targeted by this assay, and inadequate number of viral copies(<138 copies/mL). A negative result must be combined with clinical observations, patient history, and epidemiological information. The expected result is Negative.  Fact Sheet for Patients:  bloggercourse.com  Fact Sheet for Healthcare Providers:  seriousbroker.it  This test is no t yet approved or cleared by the [place]  FDA and  has been authorized for detection and/or diagnosis of SARS-CoV-2 by FDA under an Emergency Use Authorization (EUA). This EUA will remain  in effect (meaning this test can be used) for the duration of the COVID-19 declaration under Section 564(b)(1) of the Act, 21 U.S.C.section 360bbb-3(b)(1), unless the authorization is terminated  or revoked sooner.       Influenza A by PCR NEGATIVE NEGATIVE Final   Influenza B by PCR NEGATIVE NEGATIVE Final    Comment: (NOTE) The Xpert Xpress SARS-CoV-2/FLU/RSV plus assay is intended as an aid in the diagnosis of influenza from Nasopharyngeal swab specimens and should not be used as a sole basis for treatment. Nasal washings and aspirates are unacceptable for Xpert Xpress SARS-CoV-2/FLU/RSV testing.  Fact Sheet for  Patients: bloggercourse.com  Fact Sheet for Healthcare Providers: seriousbroker.it  This test is not yet approved or cleared by the [place]  FDA and has been authorized for detection and/or diagnosis of SARS-CoV-2 by FDA under an Emergency Use Authorization (EUA). This EUA will remain in effect (meaning this test can be used) for the duration of the COVID-19 declaration under Section 564(b)(1) of the Act, 21 U.S.C. section 360bbb-3(b)(1), unless the authorization is terminated or revoked.     Resp Syncytial Virus by PCR NEGATIVE NEGATIVE Final    Comment: (NOTE) Fact Sheet for Patients: bloggercourse.com  Fact Sheet for Healthcare Providers: seriousbroker.it  This test is not yet approved or cleared by the [place]  FDA and has been authorized for detection and/or diagnosis of SARS-CoV-2 by FDA under an Emergency Use Authorization (EUA). This EUA will remain in effect (meaning  this test can be used) for the duration of the COVID-19 declaration under Section 564(b)(1) of the Act, 21 U.S.C. section 360bbb-3(b)(1), unless the authorization is terminated or revoked.  Performed at [facility], [address] [place], [place] [address]   Urine Culture (for pregnant, neutropenic or urologic patients or patients with an indwelling urinary catheter)     Status: Abnormal   Collection Time: 10/02/24  9:25 AM   Specimen: Urine, Clean Catch  Result Value Ref Range Status   Specimen Description   Final    URINE, CLEAN CATCH Performed at [facility], [address] [place], [place] [address]    Special Requests   Final    NONE Performed at [facility], [address] [name], [place] [address]    Culture >=100,000 COLONIES/mL KLEBSIELLA PNEUMONIAE (A)  Final   Report Status 10/04/2024 FINAL  Final   Organism ID, Bacteria KLEBSIELLA PNEUMONIAE  (A)  Final      Susceptibility   Klebsiella pneumoniae - MIC*    AMPICILLIN  RESISTANT Resistant     CEFAZOLIN  (URINE) Value in next row Sensitive      2 SENSITIVEThis is a modified FDA-approved test that has been validated and its performance characteristics determined by the reporting laboratory.  This laboratory is certified under the Clinical Laboratory Improvement Amendments CLIA as qualified to perform high complexity clinical laboratory testing.    CEFEPIME Value in next row Sensitive      2 SENSITIVEThis is a modified FDA-approved test that has been validated and its performance characteristics determined by the reporting laboratory.  This laboratory is certified under the Clinical Laboratory Improvement Amendments CLIA as qualified to perform high complexity clinical laboratory testing.    ERTAPENEM Value in next row Sensitive      2 SENSITIVEThis is a modified FDA-approved test that has been validated and its performance characteristics determined by the reporting laboratory.  This laboratory is certified under the Clinical Laboratory Improvement Amendments CLIA as qualified to perform high complexity clinical laboratory testing.    CEFTRIAXONE  Value in next row Sensitive      2 SENSITIVEThis is a modified FDA-approved test that has been validated and its performance characteristics determined by the reporting laboratory.  This laboratory is certified under the Clinical Laboratory Improvement Amendments CLIA as qualified to perform high complexity clinical laboratory testing.    CIPROFLOXACIN  Value in next row Sensitive      2 SENSITIVEThis is a modified FDA-approved test that has been validated and its performance characteristics determined by the reporting laboratory.  This laboratory is certified under the Clinical Laboratory Improvement Amendments CLIA as qualified to perform high complexity clinical laboratory testing.    GENTAMICIN Value in next row Sensitive      2 SENSITIVEThis is a  modified FDA-approved test that has been validated and its performance characteristics determined by the reporting laboratory.  This laboratory is certified under the Clinical Laboratory Improvement Amendments CLIA as qualified to perform high complexity clinical laboratory testing.    NITROFURANTOIN  Value in next row Resistant      2 SENSITIVEThis is a modified FDA-approved test that has been validated and its performance characteristics determined by the reporting laboratory.  This laboratory is certified under the Clinical Laboratory Improvement Amendments CLIA as qualified to perform high complexity clinical laboratory testing.    TRIMETH/SULFA Value in next row Sensitive      2 SENSITIVEThis is a modified FDA-approved test that has been validated and its performance characteristics determined  by the reporting laboratory.  This laboratory is certified under the Clinical Laboratory Improvement Amendments CLIA as qualified to perform high complexity clinical laboratory testing.    AMPICILLIN /SULBACTAM Value in next row Sensitive      2 SENSITIVEThis is a modified FDA-approved test that has been validated and its performance characteristics determined by the reporting laboratory.  This laboratory is certified under the Clinical Laboratory Improvement Amendments CLIA as qualified to perform high complexity clinical laboratory testing.    PIP/TAZO Value in next row Sensitive      <=4 SENSITIVEThis is a modified FDA-approved test that has been validated and its performance characteristics determined by the reporting laboratory.  This laboratory is certified under the Clinical Laboratory Improvement Amendments CLIA as qualified to perform high complexity clinical laboratory testing.    MEROPENEM Value in next row Sensitive      <=4 SENSITIVEThis is a modified FDA-approved test that has been validated and its performance characteristics determined by the reporting laboratory.  This laboratory is certified  under the Clinical Laboratory Improvement Amendments CLIA as qualified to perform high complexity clinical laboratory testing.    * >=100,000 COLONIES/mL KLEBSIELLA PNEUMONIAE     Labs: CBC: Recent Labs  Lab 09/29/24 1006 10/01/24 0741 10/03/24 0552  WBC 7.1 8.7 8.9  HGB 11.6* 10.9* 10.8*  HCT 36.3 32.9* 33.8*  MCV 90.5 89.2 88.9  PLT 146* 173 188   Basic Metabolic Panel: Recent Labs  Lab 09/29/24 1006 09/30/24 0641 10/01/24 0459 10/01/24 0500 10/03/24 0552  NA 140 139  --  137 136  K 3.5 3.8  --  4.2 4.0  CL 99 97*  --  99 97*  CO2 24 28  --  24 26  GLUCOSE 188* 124*  --  118* 117*  BUN 50* 37*  --  73* 95*  CREATININE 3.23* 2.37*  --  3.03* 2.95*  CALCIUM  9.4 10.2  --  9.6 9.3  MG  --   --   --   --  1.7  PHOS  --   --  3.3  --   --    Liver Function Tests: No results for input(s): AST, ALT, ALKPHOS, BILITOT, PROT, ALBUMIN in the last 168 hours. No results for input(s): LIPASE, AMYLASE in the last 168 hours. No results for input(s): AMMONIA in the last 168 hours. Cardiac Enzymes: No results for input(s): CKTOTAL, CKMB, CKMBINDEX, TROPONINI in the last 168 hours. BNP (last 3 results) Recent Labs    03/09/24 1918 04/24/24 0633 05/18/24 0500  BNP 2,527.2* 1,057.8* 2,692.7*   CBG: Recent Labs  Lab 10/03/24 1232 10/03/24 1637 10/03/24 2146 10/04/24 0808 10/04/24 1142  GLUCAP 164* 145* 124* 93 99    Time spent: 35 minutes  Signed:  Elvan Sor  [facility] 10/04/2024 12:51 PM      [1]  Allergies Allergen Reactions   Ace Inhibitors     Other reaction(s): Unknown   Egg Protein-Containing Drug Products Diarrhea   Other     Other reaction(s): Other (See Comments) Eggs   Prednisone      Other reaction(s): Other (See Comments) joint pain   Risedronate     Other reaction(s): Other (See Comments)   Sulfa Antibiotics Itching and Swelling    Other reaction(s): Other (See Comments)   Sulfasalazine Other (See  Comments)

## 2024-10-06 ENCOUNTER — Other Ambulatory Visit: Payer: Self-pay

## 2024-10-06 ENCOUNTER — Emergency Department

## 2024-10-06 ENCOUNTER — Emergency Department
Admission: EM | Admit: 2024-10-06 | Discharge: 2024-10-06 | Disposition: A | Discharge status: Home / Self Care | Attending: Emergency Medicine | Admitting: Emergency Medicine

## 2024-10-06 DIAGNOSIS — I132 Hypertensive heart and chronic kidney disease with heart failure and with stage 5 chronic kidney disease, or end stage renal disease: Secondary | ICD-10-CM | POA: Diagnosis not present

## 2024-10-06 DIAGNOSIS — I480 Paroxysmal atrial fibrillation: Secondary | ICD-10-CM | POA: Insufficient documentation

## 2024-10-06 DIAGNOSIS — J449 Chronic obstructive pulmonary disease, unspecified: Secondary | ICD-10-CM | POA: Insufficient documentation

## 2024-10-06 DIAGNOSIS — Z992 Dependence on renal dialysis: Secondary | ICD-10-CM | POA: Insufficient documentation

## 2024-10-06 DIAGNOSIS — J45909 Unspecified asthma, uncomplicated: Secondary | ICD-10-CM | POA: Insufficient documentation

## 2024-10-06 DIAGNOSIS — I509 Heart failure, unspecified: Secondary | ICD-10-CM | POA: Diagnosis not present

## 2024-10-06 DIAGNOSIS — Z85828 Personal history of other malignant neoplasm of skin: Secondary | ICD-10-CM | POA: Insufficient documentation

## 2024-10-06 DIAGNOSIS — E8770 Fluid overload, unspecified: Secondary | ICD-10-CM | POA: Insufficient documentation

## 2024-10-06 DIAGNOSIS — Z7901 Long term (current) use of anticoagulants: Secondary | ICD-10-CM | POA: Diagnosis not present

## 2024-10-06 DIAGNOSIS — R0602 Shortness of breath: Secondary | ICD-10-CM | POA: Diagnosis present

## 2024-10-06 DIAGNOSIS — E039 Hypothyroidism, unspecified: Secondary | ICD-10-CM | POA: Diagnosis not present

## 2024-10-06 DIAGNOSIS — N186 End stage renal disease: Secondary | ICD-10-CM | POA: Diagnosis not present

## 2024-10-06 DIAGNOSIS — E1022 Type 1 diabetes mellitus with diabetic chronic kidney disease: Secondary | ICD-10-CM | POA: Insufficient documentation

## 2024-10-06 DIAGNOSIS — I5030 Unspecified diastolic (congestive) heart failure: Secondary | ICD-10-CM | POA: Insufficient documentation

## 2024-10-06 DIAGNOSIS — R0600 Dyspnea, unspecified: Secondary | ICD-10-CM

## 2024-10-06 LAB — BASIC METABOLIC PANEL WITH GFR
Anion gap: 17 — ABNORMAL HIGH (ref 5–15)
BUN: 81 mg/dL — ABNORMAL HIGH (ref 8–23)
CO2: 22 mmol/L (ref 22–32)
Calcium: 9.5 mg/dL (ref 8.9–10.3)
Chloride: 100 mmol/L (ref 98–111)
Creatinine, Ser: 3.58 mg/dL — ABNORMAL HIGH (ref 0.44–1.00)
GFR, Estimated: 12 mL/min — ABNORMAL LOW
Glucose, Bld: 108 mg/dL — ABNORMAL HIGH (ref 70–99)
Potassium: 4 mmol/L (ref 3.5–5.1)
Sodium: 139 mmol/L (ref 135–145)

## 2024-10-06 LAB — RESP PANEL BY RT-PCR (RSV, FLU A&B, COVID)  RVPGX2
Influenza A by PCR: NEGATIVE
Influenza B by PCR: NEGATIVE
Resp Syncytial Virus by PCR: NEGATIVE
SARS Coronavirus 2 by RT PCR: NEGATIVE

## 2024-10-06 LAB — CBC
HCT: 35.3 % — ABNORMAL LOW (ref 36.0–46.0)
Hemoglobin: 11.5 g/dL — ABNORMAL LOW (ref 12.0–15.0)
MCH: 28.8 pg (ref 26.0–34.0)
MCHC: 32.6 g/dL (ref 30.0–36.0)
MCV: 88.5 fL (ref 80.0–100.0)
Platelets: 188 K/uL (ref 150–400)
RBC: 3.99 MIL/uL (ref 3.87–5.11)
RDW: 15.7 % — ABNORMAL HIGH (ref 11.5–15.5)
WBC: 8.1 K/uL (ref 4.0–10.5)
nRBC: 0 % (ref 0.0–0.2)

## 2024-10-06 LAB — BLOOD GAS, VENOUS
Acid-Base Excess: 0.1 mmol/L (ref 0.0–2.0)
Bicarbonate: 23.8 mmol/L (ref 20.0–28.0)
O2 Saturation: 62.7 %
Patient temperature: 37
pCO2, Ven: 35 mmHg — ABNORMAL LOW (ref 44–60)
pH, Ven: 7.44 — ABNORMAL HIGH (ref 7.25–7.43)
pO2, Ven: 38 mmHg (ref 32–45)

## 2024-10-06 LAB — PRO BRAIN NATRIURETIC PEPTIDE: Pro Brain Natriuretic Peptide: 4589 pg/mL — ABNORMAL HIGH

## 2024-10-06 LAB — LACTIC ACID, PLASMA: Lactic Acid, Venous: 2 mmol/L (ref 0.5–1.9)

## 2024-10-06 LAB — TROPONIN T, HIGH SENSITIVITY
Troponin T High Sensitivity: 68 ng/L — ABNORMAL HIGH (ref 0–19)
Troponin T High Sensitivity: 68 ng/L — ABNORMAL HIGH (ref 0–19)

## 2024-10-06 LAB — D-DIMER, QUANTITATIVE: D-Dimer, Quant: 0.48 ug{FEU}/mL (ref 0.00–0.50)

## 2024-10-06 MED ORDER — CHLORHEXIDINE GLUCONATE CLOTH 2 % EX PADS
6.0000 | MEDICATED_PAD | Freq: Every day | CUTANEOUS | Status: DC
  Filled 2024-10-06: qty 6

## 2024-10-06 NOTE — ED Provider Notes (Signed)
 Reassessed after dialysis, only able to remove 1 L due to patient reporting cramping symptoms but patient reports significantly improved symptoms.  She would like to try to go home, she has dialysis as an outpatient on Wednesday.   Claudene Rover, MD 10/06/24 [phone]

## 2024-10-06 NOTE — Progress Notes (Signed)
 " [facility]  ROUNDING NOTE   Subjective:   [name] is well known to our practice and receives dialysis at [facility] on a MWF schedule. She presents to ED with shortness of breath and is being seen for sob ems  Patient is known to our practice and receives outpatient dialysis at [facility] on a MWF schedule, supervised by Dr Marcelino. Last treatment received during previous admission on Friday. Patient states she awoke today with shortness of breath. Due to multiple complaints of shortness of breath, access discomfort, etc, we were unable to pull sufficient fluid during her last two treatments inpatient.   Labs remains stable for renal patient. pBNP 4500. Lactic acid 2.0. Respiratory panel negative. Chest xray shows stable cardiomegaly with pulmonary hyperinflation.   We have been consulted to provide hemodialysis.    Objective:  Vital signs in last 24 hours:  Temp:  [97.7 F (36.5 C)] 97.7 F (36.5 C) (01/05 0815) Pulse Rate:  [62-67] 62 (01/05 1400) Resp:  [25-30] 30 (01/05 1400) BP: (138-157)/(45-54) 140/45 (01/05 1400) SpO2:  [93 %-100 %] 100 % (01/05 1400) Weight:  [63.2 kg] 63.2 kg (01/05 0819)  Weight change:  Filed Weights   10/06/24 0819  Weight: 63.2 kg    Intake/Output: No intake/output data recorded.   Intake/Output this shift:  No intake/output data recorded.  Physical Exam: General: NAD  Head: Normocephalic, atraumatic. Moist oral mucosal membranes  Eyes: Anicteric  Lungs:  Clear, cough, room air  Heart: Regular rate and rhythm  Abdomen:  Soft, nontender  Extremities:  No peripheral edema.  Neurologic: Awake, alert, conversant  Skin: Warm,dry, no rash  Access: Lt upper AVF    Basic Metabolic Panel: Recent Labs  Lab 09/30/24 0641 10/01/24 0459 10/01/24 0500 10/03/24 0552 10/06/24 0835  NA 139  --  137 136 139  K 3.8  --  4.2 4.0 4.0  CL 97*  --  99 97* 100  CO2 28  --  24 26 22   GLUCOSE 124*  --  118* 117* 108*   BUN 37*  --  73* 95* 81*  CREATININE 2.37*  --  3.03* 2.95* 3.58*  CALCIUM  10.2  --  9.6 9.3 9.5  MG  --   --   --  1.7  --   PHOS  --  3.3  --   --   --     Liver Function Tests: No results for input(s): AST, ALT, ALKPHOS, BILITOT, PROT, ALBUMIN in the last 168 hours. No results for input(s): LIPASE, AMYLASE in the last 168 hours. No results for input(s): AMMONIA in the last 168 hours.  CBC: Recent Labs  Lab 10/01/24 0741 10/03/24 0552 10/06/24 0835  WBC 8.7 8.9 8.1  HGB 10.9* 10.8* 11.5*  HCT 32.9* 33.8* 35.3*  MCV 89.2 88.9 88.5  PLT 173 188 188    Cardiac Enzymes: No results for input(s): CKTOTAL, CKMB, CKMBINDEX, TROPONINI in the last 168 hours.  BNP: Invalid input(s): POCBNP  CBG: Recent Labs  Lab 10/03/24 1232 10/03/24 1637 10/03/24 2146 10/04/24 0808 10/04/24 1142  GLUCAP 164* 145* 124* 93 99    Microbiology: Results for orders placed or performed during the hospital encounter of 10/06/24  Resp panel by RT-PCR (RSV, Flu A&B, Covid) Anterior Nasal Swab     Status: None   Collection Time: 10/06/24  8:35 AM   Specimen: Anterior Nasal Swab  Result Value Ref Range Status   SARS Coronavirus 2 by RT PCR NEGATIVE NEGATIVE  Final    Comment: (NOTE) SARS-CoV-2 target nucleic acids are NOT DETECTED.  The SARS-CoV-2 RNA is generally detectable in upper respiratory specimens during the acute phase of infection. The lowest concentration of SARS-CoV-2 viral copies this assay can detect is 138 copies/mL. A negative result does not preclude SARS-Cov-2 infection and should not be used as the sole basis for treatment or other patient management decisions. A negative result may occur with  improper specimen collection/handling, submission of specimen other than nasopharyngeal swab, presence of viral mutation(s) within the areas targeted by this assay, and inadequate number of viral copies(<138 copies/mL). A negative result must be combined  with clinical observations, patient history, and epidemiological information. The expected result is Negative.  Fact Sheet for Patients:  bloggercourse.com  Fact Sheet for Healthcare Providers:  seriousbroker.it  This test is no t yet approved or cleared by the [place]  FDA and  has been authorized for detection and/or diagnosis of SARS-CoV-2 by FDA under an Emergency Use Authorization (EUA). This EUA will remain  in effect (meaning this test can be used) for the duration of the COVID-19 declaration under Section 564(b)(1) of the Act, 21 U.S.C.section 360bbb-3(b)(1), unless the authorization is terminated  or revoked sooner.       Influenza A by PCR NEGATIVE NEGATIVE Final   Influenza B by PCR NEGATIVE NEGATIVE Final    Comment: (NOTE) The Xpert Xpress SARS-CoV-2/FLU/RSV plus assay is intended as an aid in the diagnosis of influenza from Nasopharyngeal swab specimens and should not be used as a sole basis for treatment. Nasal washings and aspirates are unacceptable for Xpert Xpress SARS-CoV-2/FLU/RSV testing.  Fact Sheet for Patients: bloggercourse.com  Fact Sheet for Healthcare Providers: seriousbroker.it  This test is not yet approved or cleared by the [place]  FDA and has been authorized for detection and/or diagnosis of SARS-CoV-2 by FDA under an Emergency Use Authorization (EUA). This EUA will remain in effect (meaning this test can be used) for the duration of the COVID-19 declaration under Section 564(b)(1) of the Act, 21 U.S.C. section 360bbb-3(b)(1), unless the authorization is terminated or revoked.     Resp Syncytial Virus by PCR NEGATIVE NEGATIVE Final    Comment: (NOTE) Fact Sheet for Patients: bloggercourse.com  Fact Sheet for Healthcare Providers: seriousbroker.it  This test is not yet approved  or cleared by the [place]  FDA and has been authorized for detection and/or diagnosis of SARS-CoV-2 by FDA under an Emergency Use Authorization (EUA). This EUA will remain in effect (meaning this test can be used) for the duration of the COVID-19 declaration under Section 564(b)(1) of the Act, 21 U.S.C. section 360bbb-3(b)(1), unless the authorization is terminated or revoked.  Performed at [facility], [address] [place], [place] [address]     Coagulation Studies: No results for input(s): LABPROT, INR in the last 72 hours.  Urinalysis: No results for input(s): COLORURINE, LABSPEC, PHURINE, GLUCOSEU, HGBUR, BILIRUBINUR, KETONESUR, PROTEINUR, UROBILINOGEN, NITRITE, LEUKOCYTESUR in the last 72 hours.  Invalid input(s): APPERANCEUR     Imaging: DG Chest 2 View Result Date: 10/06/2024 CLINICAL DATA:  Shortness of breath. End-stage renal disease on hemodialysis. EXAM: CHEST - 2 VIEW COMPARISON:  09/30/2024 FINDINGS: Stable moderate cardiomegaly. Stable pulmonary hyperinflation. Both lungs are clear. No pleural effusions seen. IMPRESSION: Stable cardiomegaly and pulmonary hyperinflation. No active lung disease. Electronically Signed   By: Norleen DELENA Kil M.D.   On: 10/06/2024 09:16      Medications:       Chlorhexidine  Gluconate Cloth  6 each Topical Q0600     Assessment/ Plan:  Ms. GRAYSON WHITE is a 84 y.o.  female is well known to our practice and receives dialysis at [facility] on a MWF schedule. She presents to ED with shortness of breath and is being seen for sob ems   Acute respiratory failure, chest xray shows stable cardiomegaly with pulmonary hyperinflation. Remains on room air  2. End stage renal disease on hemodialysis.  Unable to remove fluid during previous admission due to numerous patient complaints, shortness of breath and access discomfort. Will perform dialysis today with UF goal 2L.   3. Anemia of chronic  kidney disease Lab Results  Component Value Date   HGB 11.5 (L) 10/06/2024    Hgb stable  4. Secondary Hyperparathyroidism: with outpatient labs: Unavailable  Lab Results  Component Value Date   PTH 130.0 08/11/2024   CALCIUM  9.5 10/06/2024   PHOS 3.3 10/01/2024    Will continue to monitor bone minerals    LOS: 0 Rheana Casebolt 1/5/20262:20 PM   "

## 2024-10-06 NOTE — ED Provider Notes (Signed)
 "  [facility] Provider Note    Event Date/Time   First MD Initiated Contact with Patient 10/06/24 [phone]     (approximate)   History   Shortness of Breath   HPI  [name] is a 84 y.o. female discharged January 3 for congestive heart failure history of respiratory failure with hypoxia.  Recent admission for pulmonary edema after missing dialysis Reviewed external records from Jan this year ESRD MWF, HTN/chronic HFpEF, PAF on Eliquis , HLD, IIDM, COPD Gold stage I, obesity   Woke this morning short of breath.  Was not able to do dialysis as she reports she feels short of breath.  Somewhat worse with laying down.  No fevers.  Occasional cough.  No bodyaches or chills.  No pain anywhere no chest pain.  Took all of her morning medications prior to calling EMS including her blood pressure medicine and she reports compliance with her fluid pills.  Last dialysis was in the hospital Friday.  Due today.  Reports water restriction over the last few days as well.  Denies swelling.  No abdominal pain.  Past Medical History:  Diagnosis Date   Actinic keratosis    Albuminuria    Allergy    Anemia    Arthritis    Asthma    Basal cell carcinoma 04/12/2017   Above right lateral brow. Nodulocystic type. EDC   Cancer (HCC)    skin   Cataract cortical, senile    CHF (congestive heart failure) (HCC)    COPD (chronic obstructive pulmonary disease) (HCC)    Diabetes mellitus without complication (HCC)    GERD (gastroesophageal reflux disease)    Hemorrhoids    History of kidney stones    Hyperlipidemia    Hypertension    Hypothyroidism    Lyme disease    No kidney function    OSA (obstructive sleep apnea)    Osteoporosis    Osteoporosis    Oxygen deficiency July 2025   Reflux esophagitis    Sleep apnea    Squamous cell carcinoma of skin 09/11/2024   Right Dorsal Hand distal, EDC   Squamous cell carcinoma of skin 09/11/2024   Right Dorsal Hand proximal,  EDC   Steatohepatitis    Steatohepatitis      Physical Exam   Triage Vital Signs: ED Triage Vitals  Encounter Vitals Group     BP      [name]      [name]      [name]      [name]      Pulse      Resp      Temp      Temp src      SpO2      Weight      Height      Head Circumference      Peak Flow      Pain Score      Pain Loc      Pain Education      Exclude from Growth Chart     Most recent vital signs: Vitals:   10/06/24 1300 10/06/24 1400  BP: (!) 146/45 (!) 140/45  Pulse: 67 62  Resp: (!) 28 (!) 30  Temp:    SpO2: 98% 100%    General: Awake, no distress except she is tachypneic.  She is not in distress noted respiratory rate is elevated. CV:   Good peripheral  perfusion.  Moderate to severe holosystolic type murmur but query if it actually radiates from her fistula in the left arm.  Somewhat hard to isolate but sound becomes more distant as moving away from the left upper chest. Resp:  Tachypnea.  No wheezing no cough no hypoxia speaks in phrases. Lung sounds clear bilateral slightly diminished in the bases.  Overall lung sounds are very much clear with the exception to have increased respiratory rate Abd:   No distention. Soft, non-tender to palpation in all quadrants. No rebound or guarding. Neuro:   No focal neuro deficits noted. Moves extremities well without noted concern. Other:  No lower extremity edema venous cords or congestion   ED Results / Procedures / Treatments   Labs (all labs ordered are listed, but only abnormal results are displayed) Labs Reviewed  CBC - Abnormal; Notable for the following components:      Result Value   Hemoglobin 11.5 (*)    HCT 35.3 (*)    RDW 15.7 (*)    All other components within normal limits  BASIC METABOLIC PANEL WITH GFR - Abnormal; Notable for the following components:   Glucose, Bld 108 (*)    BUN 81 (*)    Creatinine,  Ser 3.58 (*)    GFR, Estimated 12 (*)    Anion gap 17 (*)    All other components within normal limits  PRO BRAIN NATRIURETIC PEPTIDE - Abnormal; Notable for the following components:   Pro Brain Natriuretic Peptide 4,589.0 (*)    All other components within normal limits  BLOOD GAS, VENOUS - Abnormal; Notable for the following components:   pH, Ven 7.44 (*)    pCO2, Ven 35 (*)    All other components within normal limits  LACTIC ACID, PLASMA - Abnormal; Notable for the following components:   Lactic Acid, Venous 2.0 (*)    All other components within normal limits  TROPONIN T, HIGH SENSITIVITY - Abnormal; Notable for the following components:   Troponin T High Sensitivity 68 (*)    All other components within normal limits  TROPONIN T, HIGH SENSITIVITY - Abnormal; Notable for the following components:   Troponin T High Sensitivity 68 (*)    All other components within normal limits  RESP PANEL BY RT-PCR (RSV, FLU A&B, COVID)  [id number]  D-DIMER, QUANTITATIVE     EKG  I independently reviewed the EKG (see course note)   RADIOLOGY I independently reviewed images of chest x-ray with cardiomegaly but no evidence of obvious edema  DG Chest 2 View Result Date: 10/06/2024 CLINICAL DATA:  Shortness of breath. End-stage renal disease on hemodialysis. EXAM: CHEST - 2 VIEW COMPARISON:  09/30/2024 FINDINGS: Stable moderate cardiomegaly. Stable pulmonary hyperinflation. Both lungs are clear. No pleural effusions seen. IMPRESSION: Stable cardiomegaly and pulmonary hyperinflation. No active lung disease. Electronically Signed   By: Norleen DELENA Kil M.D.   On: 10/06/2024 09:16       PROCEDURES:  Critical Care performed: No  Procedures   MEDICATIONS ORDERED IN ED: Medications  Chlorhexidine  Gluconate Cloth 2 % PADS 6 each (has no administration in time range)     IMPRESSION / MDM / ASSESSMENT AND PLAN / ED COURSE  I reviewed the triage vital signs and the nursing notes.                               Based on presentation, the differential diagnosis includes, but is not limited  to key considerations: shortness of breath DIFFERENTIAL DIAGNOSIS CONSIDERATIONS: High-acuity etiologies considered and prioritized for rule-out: - Pulmonary Embolism - Acute Decompensated Heart Failure - COPD / Asthma Exacerbation - Tension Pneumothorax - Pneumonia with Sepsis - Upper Airway Obstruction / Anaphylaxis - Acute Coronary Syndrome (Anginal Equivalent) - Metabolic Acidosis (DKA/Sepsis)  No obvious infectious symptoms.  Will obtain chest x-ray.  She is anticoagulated she was recently in the hospital she seems low risk for PE overall but given her recent hospitalization no prior imaging to exclude PE recently I will send a D-dimer.  If this is positive we will need to coordinate with nephrology if she needs contrasted imaging   I placed a consult with Dr. Lazarus.  The patient is well-known by the dialysis service and I question if this could be related to volume overload once again given her recent presentation.  Rule out infection.  Other considerations such as pericardial effusion, pleural effusion, pneumonia, PE, anginal equivalent etc. are considered.  Overall she appears well with exception to mild tachypnea.  No wheezing she took 2 nebulizer treatment at home I doubt this is reactive airway disease  This is not an exhaustive list.   Patient's presentation is most consistent with acute complicated illness / injury requiring diagnostic workup.    The patient is on the cardiac monitor to evaluate for evidence of arrhythmia and/or significant heart rate changes.  Clinical Course as of 10/06/24 1527  Mon Oct 06, 2024  9166 EKG interpreted by me at 830 heart rate 65 QRS 95 QTc 01/[birth year]/1979.  LVH.  Very mild prolongation of QT interval [MQ]  0940 Patient evaluated by nephrology team, they feel likely still element of volume overload.  Went to perform hemodialysis session today and  thereafter reassess her clinical status and symptoms. [MQ]  0940 No evidence of DVT or high suspicion of PE.  Patient is actively anticoagulated.  D-dimer exclusionary with low pretest probability [MQ]  0941 Chemistry panel [MQ]  0941  no acute departures.  Very minimally elevated lactic acid in this patient that has end-stage renal disease as well as very slightly elevated anion gap, given her history of dialysis presentation today I think these labs are within reason [MQ]  0942 Patient's BNP has improved significantly from previous this is somewhat reassuring.  Her troponin without acute departure from previous baselines.  Plan to check delta, but given her end-stage renal disease lack of chest pain and reassuring twelve-lead I am highly doubtful of ACS at this point [MQ]    Clinical Course User Index [MQ] Dicky Anes, MD   August 2025 echocardiogram 1. Left ventricular ejection fraction, by estimation, is 55 to 60% . The left ventricle has normal function. The left ventricle has no regional wall motion abnormalities. There is mild left ventricular hypertrophy. Left ventricular diastolic parameters are consistent with Grade II diastolic dysfunction ( pseudonormalization) . 2. Right ventricular systolic function is normal. The right ventricular size is normal. There is moderately elevated pulmonary artery systolic pressure. 3. Left atrial size was severely dilated. 4. The mitral valve is normal in structure. Moderate mitral valve regurgitation. 5. The aortic valve is tricuspid. Aortic valve regurgitation is not visualized. 6. The inferior vena cava is dilated in size with > 50% respiratory variability, suggesting right atrial pressure of 8 mmHg.   ----------------------------------------- 3:26 PM on 10/06/2024 ----------------------------------------- Patient currently on hemodialysis.  Troponin now delta change.  Ongoing care signed to Dr. Claudene, follow-up with recommendations from the nephrology  team who knows  the patient well.  Disposition likely based on the nephrology team recommendations post hemodialysis as well as reassessment in the ED thereafter.  FINAL CLINICAL IMPRESSION(S) / ED DIAGNOSES   Final diagnoses:  Dyspnea, unspecified type  Hypervolemia, unspecified hypervolemia type  ESRD needing dialysis (HCC)     Rx / DC Orders   ED Discharge Orders     None        Note:  This document was prepared using Dragon voice recognition software and may include unintentional dictation errors.   Dicky Anes, MD 10/06/24 1527  "

## 2024-10-06 NOTE — ED Notes (Signed)
 Called CCMD to place pt on cardiac monitor

## 2024-10-06 NOTE — ED Triage Notes (Signed)
 Pt comes in via [facility] from home with complaints of SOB. Pt complains of heart pounding and dizziness with standing. Pt has a history of COPD, CHF, and is a dialysis pt. Pt is due for dialysis today, and hasn't missed any treatments. Pt is compliant with lasix , and has taken al prescribed medications this morning. Pt is alert and oriented x4 with no signs of acute distress at this time. MD Quale at pt bedside.

## 2024-10-06 NOTE — ED Notes (Signed)
 Pt unhooked from monitor and ambulatory to bathroom in room with no assistance.

## 2024-10-06 NOTE — Discharge Instructions (Signed)
 Go to dialysis on Wednesday, return to the ED with any significantly worsening symptoms in the meantime

## 2024-10-06 NOTE — Progress Notes (Signed)
" °   10/06/24 1547  Vitals  BP (!) 150/48  Pulse Rate 67  Weight 62.1 kg  Type of Weight Post-Dialysis  Oxygen Therapy  SpO2 100 %  O2 Device Nasal Cannula  Patient Activity (if Appropriate) In bed  Pulse Oximetry Type Continuous  Oximetry Probe Site Changed No  Post Treatment  Dialyzer Clearance Clear  Hemodialysis Intake (mL) 0 mL  Liters Processed 52.2  Fluid Removed (mL) 1000 mL  Tolerated HD Treatment No (Comment)  Post-Hemodialysis Comments Patient complaint of cramping. Cramping unresolved with uf being turned off and saline administration. Patient adamant about standing up to relieve cramping. Patient was informed that she could not stand due to the fact that she was receiving hemodialysis. Patient asked to be taken off and she would stand after being taken off. Patient given education regarding early termination. Patient declined to continue treatment due to the serverity of cramping.  AVG/AVF Arterial Site Held (minutes) 5 minutes  AVG/AVF Venous Site Held (minutes) 5 minutes    "

## 2024-10-06 NOTE — ED Notes (Signed)
 Patient verbalizes understanding of discharge instructions. Opportunity for questioning and answers were provided. Armband removed by staff, pt discharged from ED. Wheeled out to lobby, left in car with daughter.

## 2024-10-07 ENCOUNTER — Other Ambulatory Visit: Payer: Self-pay | Admitting: Physician Assistant

## 2024-10-07 DIAGNOSIS — K219 Gastro-esophageal reflux disease without esophagitis: Secondary | ICD-10-CM

## 2024-10-15 ENCOUNTER — Emergency Department

## 2024-10-15 ENCOUNTER — Emergency Department
Admission: EM | Admit: 2024-10-15 | Discharge: 2024-10-15 | Disposition: A | Discharge status: Home / Self Care | Attending: Emergency Medicine | Admitting: Emergency Medicine

## 2024-10-15 ENCOUNTER — Other Ambulatory Visit: Payer: Self-pay

## 2024-10-15 ENCOUNTER — Encounter: Payer: Self-pay | Admitting: Emergency Medicine

## 2024-10-15 DIAGNOSIS — R0602 Shortness of breath: Secondary | ICD-10-CM | POA: Diagnosis not present

## 2024-10-15 DIAGNOSIS — R002 Palpitations: Secondary | ICD-10-CM | POA: Insufficient documentation

## 2024-10-15 DIAGNOSIS — Z992 Dependence on renal dialysis: Secondary | ICD-10-CM | POA: Insufficient documentation

## 2024-10-15 LAB — URINALYSIS, ROUTINE W REFLEX MICROSCOPIC
Bilirubin Urine: NEGATIVE
Glucose, UA: NEGATIVE mg/dL
Hgb urine dipstick: NEGATIVE
Ketones, ur: NEGATIVE mg/dL
Leukocytes,Ua: NEGATIVE
Nitrite: NEGATIVE
Protein, ur: 100 mg/dL — AB
Specific Gravity, Urine: 1.01 (ref 1.005–1.030)
pH: 6 (ref 5.0–8.0)

## 2024-10-15 LAB — BASIC METABOLIC PANEL WITH GFR
Anion gap: 12 (ref 5–15)
BUN: 17 mg/dL (ref 8–23)
CO2: 29 mmol/L (ref 22–32)
Calcium: 9.5 mg/dL (ref 8.9–10.3)
Chloride: 101 mmol/L (ref 98–111)
Creatinine, Ser: 1.53 mg/dL — ABNORMAL HIGH (ref 0.44–1.00)
GFR, Estimated: 33 mL/min — ABNORMAL LOW
Glucose, Bld: 104 mg/dL — ABNORMAL HIGH (ref 70–99)
Potassium: 3 mmol/L — ABNORMAL LOW (ref 3.5–5.1)
Sodium: 141 mmol/L (ref 135–145)

## 2024-10-15 LAB — CBC
HCT: 33.7 % — ABNORMAL LOW (ref 36.0–46.0)
Hemoglobin: 10.9 g/dL — ABNORMAL LOW (ref 12.0–15.0)
MCH: 28.8 pg (ref 26.0–34.0)
MCHC: 32.3 g/dL (ref 30.0–36.0)
MCV: 88.9 fL (ref 80.0–100.0)
Platelets: 173 K/uL (ref 150–400)
RBC: 3.79 MIL/uL — ABNORMAL LOW (ref 3.87–5.11)
RDW: 16.4 % — ABNORMAL HIGH (ref 11.5–15.5)
WBC: 6.8 K/uL (ref 4.0–10.5)
nRBC: 0 % (ref 0.0–0.2)

## 2024-10-15 LAB — TROPONIN T, HIGH SENSITIVITY
Troponin T High Sensitivity: 55 ng/L — ABNORMAL HIGH (ref 0–19)
Troponin T High Sensitivity: 61 ng/L — ABNORMAL HIGH (ref 0–19)

## 2024-10-15 LAB — PROTIME-INR
INR: 1 (ref 0.8–1.2)
Prothrombin Time: 14.2 s (ref 11.4–15.2)

## 2024-10-15 LAB — MAGNESIUM: Magnesium: 1.7 mg/dL (ref 1.7–2.4)

## 2024-10-15 NOTE — ED Notes (Signed)
 Called lab to request add-on troponin as ordered

## 2024-10-15 NOTE — ED Provider Notes (Signed)
 "  [facility] Provider Note    Event Date/Time   First MD Initiated Contact with Patient 10/15/24 1621     (approximate)   History   Palpitations   HPI  [name] is a 84 y.o. female who presents to the emergency department today because of concerns for palpitations.  The patient was at dialysis when all of a sudden she felt like her heart was pounding out of her chest.  She denies any pain with this.  States that she did have some shortness of breath as well.  She says she has had similar episodes in the past although quite infrequently.  At the time my exam she says that she feels improved and no longer feels some palpitations.     Physical Exam   Triage Vital Signs: ED Triage Vitals  Encounter Vitals Group     BP 10/15/24 1415 (!) 177/69     [name] --      [name] --      [name] --      [name] --      Pulse Rate 10/15/24 1415 76     Resp 10/15/24 1415 (!) 22     Temp 10/15/24 1415 98.5 F (36.9 C)     Temp Source 10/15/24 1415 Oral     SpO2 10/15/24 1415 94 %     Weight 10/15/24 1417 136 lb 11 oz (62 kg)     Height 10/15/24 1417 5' 2 (1.575 m)     Head Circumference --      Peak Flow --      Pain Score 10/15/24 1417 0     Pain Loc --      Pain Education --      Exclude from Growth Chart --     Most recent vital signs: Vitals:   10/15/24 1415  BP: (!) 177/69  Pulse: 76  Resp: (!) 22  Temp: 98.5 F (36.9 C)  SpO2: 94%   General: Awake, alert, oriented. CV:  Good peripheral perfusion. Regular rate and rhythm. Resp:  Normal effort. Lungs clear. Abd:  No distention.  Other:  AV fistula in left upper arm.   ED Results / Procedures / Treatments   Labs (all labs ordered are listed, but only abnormal results are displayed) Labs Reviewed  BASIC METABOLIC PANEL WITH GFR - Abnormal; Notable for the following components:      Result Value    Potassium 3.0 (*)    Glucose, Bld 104 (*)    Creatinine, Ser 1.53 (*)    GFR, Estimated 33 (*)    All other components within normal limits  CBC - Abnormal; Notable for the following components:   RBC 3.79 (*)    Hemoglobin 10.9 (*)    HCT 33.7 (*)    RDW 16.4 (*)    All other components within normal limits  PROTIME-INR  URINALYSIS, ROUTINE W REFLEX MICROSCOPIC     EKG  I, Guadalupe Eagles, attending physician, personally viewed and interpreted this EKG  EKG Time: 1422 Rate: 69 Rhythm: sinus rhythm with PAC Axis: left axis deviation Intervals: qtc 497 QRS: narrow, LVH ST changes: no st elevation Impression: abnormal ekg    RADIOLOGY I independently interpreted and visualized the CXR. My interpretation: cardiomegaly. No pneumonia Radiology interpretation:  IMPRESSION:  Cardiomegaly with central vascular congestion.      PROCEDURES:  Critical Care performed: No  MEDICATIONS ORDERED IN ED: Medications - No data to display   IMPRESSION / MDM / ASSESSMENT AND PLAN / ED COURSE  I reviewed the triage vital signs and the nursing notes.                              Differential diagnosis includes, but is not limited to, arrhythmia, ACS, pneumonia  Patient's presentation is most consistent with acute presentation with potential threat to life or bodily function.   The patient is on the cardiac monitor to evaluate for evidence of arrhythmia and/or significant heart rate changes.  Patient presented to the emergency department today because of concerns for palpitations.  The time my exam patient feels improved.  She was placed on cardiac monitor which did not pick up any concerning arrhythmias.  EKG showed sinus rhythm with PAC.  Blood work here with slight troponin elevation which appears to be patient's baseline.  This was checked again and was stable.  At this time I doubt ACS.  Patient continued to feel improved here in the emergency department.  At this time  somewhat unclear etiology.  Do wonder if patient might have had a temporary arrhythmia.  Did discuss this with the patient.  Did encourage patient to follow-up with primary care for possible further cardiac monitoring.  Additionally encouraged patient return for any new or concerning symptoms.      FINAL CLINICAL IMPRESSION(S) / ED DIAGNOSES   Final diagnoses:  Palpitations      Note:  This document was prepared using Dragon voice recognition software and may include unintentional dictation errors.    Floy Roberts, MD 10/15/24 2348  "

## 2024-10-15 NOTE — ED Notes (Signed)
 Called CCMD at [phone] to initiate cardiac monitoring as ordered.

## 2024-10-15 NOTE — ED Notes (Signed)
 Pt given sandwich tray and a sprite per pt request

## 2024-10-15 NOTE — ED Notes (Signed)
 Called lab to request add-on magnesium  as ordered.

## 2024-10-15 NOTE — ED Notes (Signed)
 See triage note  Presents with some SOB  States she became SOB and felt like her heart was racing    This started while at dialysis   Afebrile on arrival Pt is speaking full sentences

## 2024-10-15 NOTE — ED Triage Notes (Signed)
 Pt arrives via EMS from dialysis where they stopped 52 min early due to pt feeling her heart fluttering. Hx of afib. Pt endorses weakness. Reports SOB has improved since stopping dialysis.

## 2024-10-22 ENCOUNTER — Other Ambulatory Visit: Payer: Self-pay | Admitting: Physician Assistant

## 2024-10-27 NOTE — Progress Notes (Unsigned)
 " Established patient visit  Patient: [name]   DOB: 1940-10-04   84 y.o. Female  MRN: [id number] Visit Date: 10/28/2024  Today's healthcare provider: Jolynn Spencer, PA-C   No chief complaint on file.  Subjective       Discussed the use of AI scribe software for clinical note transcription with the patient, who gave verbal consent to proceed.  History of Present Illness        08/19/2024    2:38 PM 05/27/2024   12:42 PM 05/08/2024   10:12 AM  Depression screen PHQ 2/9  Decreased Interest 0 0 0  Down, Depressed, Hopeless 0 0 0  PHQ - 2 Score 0 0 0  Altered sleeping 0 0 1  Tired, decreased energy 0 0 1  Change in appetite 0 0 0  Feeling bad or failure about yourself  0 0 0  Trouble concentrating 0 0 0  Moving slowly or fidgety/restless 0 0 0  Suicidal thoughts 0 0 0  PHQ-9 Score 0 0  2   Difficult doing work/chores Not difficult at all Not difficult at all Not difficult at all     Data saved with a previous flowsheet row definition      08/19/2024    2:39 PM 05/08/2024   10:12 AM 03/27/2024    3:18 PM 02/15/2024   10:34 AM  GAD 7 : Generalized Anxiety Score  Nervous, Anxious, on Edge 0  0  0  0   Control/stop worrying 0  0  0  0   Worry too much - different things 0  0  0    Trouble relaxing 0  0  0  2   Restless 0  0  0  3   Easily annoyed or irritable 0  1  0  0   Afraid - awful might happen 0  0  0  0   Total GAD 7 Score 0 1 0   Anxiety Difficulty Not difficult at all Not difficult at all Not difficult at all Not difficult at all     Data saved with a previous flowsheet row definition    Medications: Show/hide medication list[1]  Review of Systems  All other systems reviewed and are negative.  All negative Except see HPI   {Insert previous labs (optional):23779} {See past labs  Heme  Chem  Endocrine  Serology  Results Review (optional):1}   Objective    There were no vitals taken for this visit. {Insert last BP/Wt (optional):23777}{See  vitals history (optional):1}   Physical Exam Vitals reviewed.  Constitutional:      General: She is not in acute distress.    Appearance: Normal appearance. She is well-developed. She is not diaphoretic.  HENT:     Head: Normocephalic and atraumatic.  Eyes:     General: No scleral icterus.    Conjunctiva/sclera: Conjunctivae normal.  Neck:     Thyroid : No thyromegaly.  Cardiovascular:     Rate and Rhythm: Normal rate and regular rhythm.     Pulses: Normal pulses.     Heart sounds: Normal heart sounds. No murmur heard. Pulmonary:     Effort: Pulmonary effort is normal. No respiratory distress.     Breath sounds: Normal breath sounds. No wheezing, rhonchi or rales.  Musculoskeletal:     Cervical back: Neck supple.     Right lower leg: No edema.     Left lower leg: No edema.  Lymphadenopathy:     Cervical: No cervical  adenopathy.  Skin:    General: Skin is warm and dry.     Findings: No rash.  Neurological:     Mental Status: She is alert and oriented to person, place, and time. Mental status is at baseline.  Psychiatric:        Mood and Affect: Mood normal.        Behavior: Behavior normal.     No results found for any visits on 10/28/24.      Assessment and Plan Assessment & Plan     No orders of the defined types were placed in this encounter.   No follow-ups on file.   The patient was advised to call back or seek an in-person evaluation if the symptoms worsen or if the condition fails to improve as anticipated.  I discussed the assessment and treatment plan with the patient. The patient was provided an opportunity to ask questions and all were answered. The patient agreed with the plan and demonstrated an understanding of the instructions.  I, Dua Mehler, PA-C have reviewed all documentation for this visit. The documentation on 10/28/2024  for the exam, diagnosis, procedures, and orders are all accurate and complete.  Jolynn Spencer, [address] [facility], MMS [facility] [phone] (phone) [phone] (fax)  [facility]     [1] Outpatient Medications Prior to Visit  Medication Sig   albuterol  (VENTOLIN  HFA) 108 (90 Base) MCG/ACT inhaler Inhale 2 puffs into the lungs every 6 (six) hours as needed for wheezing or shortness of breath.   allopurinol  (ZYLOPRIM ) 100 MG tablet TAKE ONE TABLET (100 MG) BY MOUTH ONCE DAILY   amLODipine  (NORVASC ) 5 MG tablet TAKE 1 TABLET BY MOUTH DAILY.   apixaban  (ELIQUIS ) 2.5 MG TABS tablet Take 1 tablet (2.5 mg total) by mouth 2 (two) times daily.   calcium  carbonate (CALCIUM  600) 600 MG TABS tablet Take 600 mg by mouth daily.   epoetin  alfa-epbx (RETACRIT ) 4000 UNIT/ML injection Inject 1 mL (4,000 Units total) into the vein every Monday, Wednesday, and Friday at 6 PM.   esomeprazole  (NEXIUM ) 40 MG capsule TAKE 1 CAPSULE (40 MG) BY MOUTH ONCE DAILY BEFORE BREAKFAST   ferrous sulfate  325 (65 FE) MG tablet Take 325 mg by mouth daily with breakfast.   fluticasone  furoate-vilanterol (BREO ELLIPTA ) 200-25 MCG/ACT AEPB Inhale 1 puff into the lungs daily.   folic acid  (FOLVITE ) 1 MG tablet Take 1 tablet (1 mg total) by mouth daily.   hydrALAZINE  (APRESOLINE ) 50 MG tablet Take 1 tablet (50 mg total) by mouth 3 (three) times daily. Hold off if systolic BP less than 140 mmHg   ipratropium-albuterol  (DUONEB) 0.5-2.5 (3) MG/3ML SOLN INHALE THE CONTENTS OF ONE (1) VIAL VIA NEBULIZATION EVERY 6 HOURS AS NEEDED FORSHORTNESS OF BREATH   levothyroxine  (SYNTHROID ) 150 MCG tablet TAKE 1 TABLET BY MOUTH ONCE DAILY. TAKE ON AN EMPTY STOMACH WITH A GLASS OF WATER AT LEAST 30-60 MINUTES BEFORE BREAKFAST   lidocaine -prilocaine  (EMLA ) cream as directed. Before dialysis   metoprolol  tartrate (LOPRESSOR ) 25 MG tablet Take 0.5 tablets (12.5 mg total) by mouth 2 (two) times daily.   montelukast  (SINGULAIR ) 10 MG tablet TAKE 1 TABLET BY MOUTH NIGHTLY   nystatin powder Apply 1 Application topically 3 (three)  times daily as needed (itching).   rosuvastatin  (CRESTOR ) 40 MG tablet TAKE ONE TABLET BY MOUTH EVERY EVENING   sertraline  (ZOLOFT ) 100 MG tablet TAKE 1 TABLET BY MOUTH DAILY   torsemide  (DEMADEX ) 20 MG tablet TAKE TWO TABLETS BY MOUTH  ONCE DAILY   No facility-administered medications prior to visit.  "

## 2024-10-28 ENCOUNTER — Other Ambulatory Visit: Payer: Self-pay | Admitting: Internal Medicine

## 2024-10-28 ENCOUNTER — Ambulatory Visit: Admitting: Physician Assistant

## 2024-10-28 ENCOUNTER — Telehealth: Payer: Self-pay | Admitting: Cardiovascular Disease

## 2024-10-28 DIAGNOSIS — E039 Hypothyroidism, unspecified: Secondary | ICD-10-CM

## 2024-10-28 DIAGNOSIS — J9690 Respiratory failure, unspecified, unspecified whether with hypoxia or hypercapnia: Secondary | ICD-10-CM

## 2024-10-28 DIAGNOSIS — N185 Chronic kidney disease, stage 5: Secondary | ICD-10-CM

## 2024-10-28 DIAGNOSIS — N19 Unspecified kidney failure: Secondary | ICD-10-CM

## 2024-10-28 DIAGNOSIS — E876 Hypokalemia: Secondary | ICD-10-CM

## 2024-10-28 DIAGNOSIS — D631 Anemia in chronic kidney disease: Secondary | ICD-10-CM

## 2024-10-28 DIAGNOSIS — G4733 Obstructive sleep apnea (adult) (pediatric): Secondary | ICD-10-CM

## 2024-10-28 DIAGNOSIS — E669 Obesity, unspecified: Secondary | ICD-10-CM

## 2024-10-28 DIAGNOSIS — I10 Essential (primary) hypertension: Secondary | ICD-10-CM

## 2024-10-28 DIAGNOSIS — E213 Hyperparathyroidism, unspecified: Secondary | ICD-10-CM

## 2024-10-28 DIAGNOSIS — Z09 Encounter for follow-up examination after completed treatment for conditions other than malignant neoplasm: Secondary | ICD-10-CM

## 2024-10-28 DIAGNOSIS — E7849 Other hyperlipidemia: Secondary | ICD-10-CM

## 2024-10-28 DIAGNOSIS — I480 Paroxysmal atrial fibrillation: Secondary | ICD-10-CM

## 2024-10-28 DIAGNOSIS — I5033 Acute on chronic diastolic (congestive) heart failure: Secondary | ICD-10-CM

## 2024-10-28 DIAGNOSIS — M810 Age-related osteoporosis without current pathological fracture: Secondary | ICD-10-CM

## 2024-10-28 DIAGNOSIS — J449 Chronic obstructive pulmonary disease, unspecified: Secondary | ICD-10-CM

## 2024-10-28 DIAGNOSIS — J441 Chronic obstructive pulmonary disease with (acute) exacerbation: Secondary | ICD-10-CM

## 2024-10-28 DIAGNOSIS — M1A00X0 Idiopathic chronic gout, unspecified site, without tophus (tophi): Secondary | ICD-10-CM

## 2024-10-28 NOTE — Telephone Encounter (Signed)
 Called patient - rescheduled for THUR 1:55

## 2024-10-28 NOTE — Telephone Encounter (Signed)
 Patient c/o Palpitations:  STAT if patient reporting lightheadedness, shortness of breath, or chest pain  How long have you had palpitations/irregular HR/ Afib? Are you having the symptoms now? Week ago   Are you currently experiencing lightheadedness, SOB or CP? No   Do you have a history of afib (atrial fibrillation) or irregular heart rhythm? Unknown   Have you checked your BP or HR? (document readings if available): Unknown   Are you experiencing any other symptoms? No    Patient cancelled appt due to the weather and stated she needed one sooner than offered which was 11/10/24 please advise

## 2024-10-29 ENCOUNTER — Ambulatory Visit: Admitting: Physician Assistant

## 2024-10-29 NOTE — Progress Notes (Unsigned)
 "  Cardiology Clinic Note   Date: 10/30/2024 ID: [name], DOB 11-10-1940, MRN [id number]  Primary Cardiologist:  Deatrice Cage, MD  Chief Complaint   [name] is a 84 y.o. female who presents to the clinic today for hospital follow up.   Patient Profile   [name] is followed by Dr. Cage for the history outlined below.       Past medical history significant for: Nonobstructive CAD. LHC 08/13/2014 (angina): Ostial D2 20%.  Ostial LCx 20%. Chronic HFpEF. Echo 05/18/2024: EF 55 to 60%.  No RWMA.  Mild LVH.  Grade II DD.  Normal RV size/function.  Moderately elevated PA pressure, RVSP 57.3 mmHg.  Severe LAE.  Moderate MR. PAF. Onset July 2024. 14-day ZIO 06/25/2023: HR 45 to 154 bpm, average 56 bpm.  2 runs of NSVT fastest lasting 4 beats max rate 154 bpm, longest lasting 8 beats average rate 129 bpm.  43 runs of SVT fastest lasting 6 beats max rate 150 bpm, longest lasting 12.7 seconds average rate 103 bpm.  Idioventricular rhythm was present.  Rare ectopy.  Patient triggered events associated with NSR. Hypertension. Hyperlipidemia. OSA. COPD. GERD. T2DM. Hypothyroidism. ESRD on HD. MWF.  In summary, patient was first evaluated by Dr. Cage on 07/24/2014 for chest pain and shortness of breath at the request of Dr. Ike.  She underwent nuclear stress testing which is a moderate risk study demonstrating mid apical, inferior lateral perfusion defect with mild reversibility suggestive of mild to moderate infarct with mild peri-infarct ischemia.  She underwent LHC which showed mild nonobstructive CAD as detailed above.  Patient underwent hospital admission 03/23/2023 to 03/31/2023 due to hypocalcemia and hypomagnesia.  She was rehospitalized 04/16/2023 to 04/22/2023 with generalized weakness and shortness of breath.  She was found to have A-fib on telemetry.  Echo demonstrated normal LV function as detailed above.  She presented back to [facility] emergency department on 05/22/2023  with complaints of shortness of breath.  She was found to be in A-fib with a rate of 106 bpm.  She spontaneously converted.  She was placed on Eliquis  2.5 mg twice daily for stroke prophylaxis.  She was placed on a 14-day ZIO which demonstrated runs of NSVT and SVT as detailed above.  Patient underwent hospital admission from 12/21/2023 to 01/01/2024 for pneumonia, COPD exacerbation, CHF exacerbation.  Patient presented to the ED on 12/21/2023 with complaints of shortness of breath, cough, fever.  Upon EMS arrival SpO2 was 90% on room air improving to 93% on 4 L.  Plan to start patient on dialysis to help manage volume.  Her hospital course was complicated by AV fistula malfunction and she underwent angioplasty and stent placement with vascular surgery.  She was discharged on 01/01/2024 with plan to begin outpatient dialysis.   Patient was seen in the office on 01/17/2024 for hospital follow up.  She was tolerating hemodialysis well with no issues of hypotension since holding hydralazine  after dialysis.  She was pending home PT.  She reported occasional palpitations described as heart racing with exertion that resolved with rest.  Patient was seen in the ED on 03/05/2024 with complaints of shortness of breath.  EKG demonstrated sinus rhythm.  She was given breathing treatments with improvement of symptoms and discharged on doxycycline .  She was found to be hypokalemic and potassium was repleted with p.o. potassium.  She again presented to the ED on 03/09/2024 with complaints of worsening shortness of breath and chest pain radiating to back.  Initial labs: WBC 8.6, hemoglobin 8.5, hematocrit 26.1, sodium 135, potassium 3.8, creatinine 3.21, BUN 75, BNP 2527.  Troponin 37>> 43.  Respiratory panel negative.  Chest x-ray showed worsening right greater than left bibasilar atelectasis, small left pleural effusion.  She was admitted for COPD/CHF exacerbation.  She was treated with breathing treatments and given a dose of IV  Lasix .  Echo demonstrated EF 55 to 60% (further details above).  She was discharged on 03/12/2024.    Patient presented to the ED at [facility] on 04/08/2024 from dialysis for left fistula not working.  She was having difficulty laying flat secondary to shortness of breath.  She was admitted for respiratory failure.  Chest x-ray demonstrated pulmonary edema.  She was switched to torsemide  40 mg daily by nephrology.  She was Rx supplemental O2 to use with CPAP at home.  She was discharged on 04/11/2024.  Patient presented to the ED on 04/24/2024 for shortness of breath.  Per EMS she had been doing well on room air en route but became hypoxic in the 80s and was placed on CPAP.  Upon arrival to ED she was switched to BiPAP.  Chest x-ray demonstrated pulmonary congestion.  Patient admitted to increase sodium intake.  She underwent urgent dialysis for volume overload.  She was discharged on 05/01/2024.   Patient was last seen in the office by me on 05/13/2024 for hospital follow-up.  She denied any cardiac complaints.  She reported hydralazine  had recently been decreased by PCP secondary to BP drops during dialysis.  She reported not taking torsemide  on dialysis days.  She reported there were times during dialysis she has asked them to stop because she felt they were pulling off too much fluid too quickly.  She was instructed if she felt dialysis continued to take off too much fluid she should reduce her torsemide  dose to 20 mg on nondialysis days.  Patient underwent hospital admission in November 2025 for respiratory failure and acute on chronic HFpEF.  She was diuresed with IV Lasix  and volume was further controlled with hemodialysis.  She had another hospital admission for the same at the end of December.  She was also evaluated in the ED twice in January for dyspnea and palpitations.     History of Present Illness    Today, patient is here alone. She feels she was having palpitations secondary to  being on the dialysis machine too long. She explained that she started noticing palpitations began after being on the machine >3 hours. She has since insisted they take her off right at the 3 hour mark and has not had further episodes of palpitations. She feels they sometimes remove too much fluid and she will begin getting leg cramps. She will take torsemide  after she gets home from dialysis. She does occasionally have palpitations when not at dialysis usually related to exertion. These episodes are typically brief and resolve with rest. She denies chest pain, pressure or tightness. She has chronic shortness of breath with exertion that is worsened with the cold air.     ROS: All other systems reviewed and are otherwise negative except as noted in History of Present Illness.  EKGs/Labs Reviewed       EKG is not performed today.   08/23/2024: ALT 21; AST 33 10/15/2024: BUN 17; Creatinine, Ser 1.53; Potassium 3.0; Sodium 141   10/15/2024: Hemoglobin 10.9; WBC 6.8   04/24/2024: TSH 2.468   05/18/2024: B Natriuretic Peptide 2,692.7 10/06/2024: Pro Brain Natriuretic Peptide  4,589.0    Risk Assessment/Calculations     CHA2DS2-VASc Score = 6   This indicates a 9.7% annual risk of stroke. The patient's score is based upon: CHF History: 1 HTN History: 1 Diabetes History: 1 Stroke History: 0 Vascular Disease History: 0 Age Score: 2 Gender Score: 1          Physical Exam    VS:  BP (!) 156/64 (BP Location: Left Arm, Patient Position: Sitting, Cuff Size: Normal)   Pulse 77   Ht 5' 2 (1.575 m)   Wt 175 lb 9.6 oz (79.7 kg)   SpO2 94%   BMI 32.12 kg/m  , BMI Body mass index is 32.12 kg/m.  GEN: Well nourished, well developed, in no acute distress. Neck: No JVD or carotid bruits. Cardiac:  RRR.  2/6 sysotlic murmur. No rubs or gallops.   Respiratory:  Respirations regular and unlabored. Clear to auscultation without rales, wheezing or rhonchi. GI: Soft, nontender,  nondistended. Extremities: Radials/DP/PT 2+ and equal bilaterally. No clubbing or cyanosis. No edema   Skin: Warm and dry, no rash. Neuro: Strength intact.  Assessment & Plan   Nonobstructive CAD LHC November 2015 showed ostial D2 20%, ostial LCx 20%.  Patient denies chest pain, pressure or tightness. - Continue amlodipine , metoprolol , rosuvastatin .  Patient not on aspirin  secondary to Eliquis .   Chronic HFpEF Echo echo August 2025 demonstrated EF 55 to 60%, grade 2 DD, normal RV size/function, moderately elevated PA pressure, severe LAE, moderate MR.  Patient reports chronic DOE is at baseline. She does find it is worse when she is out in the cold air. She is very sensitive to dialysis and her legs will cramp if they take off too much fluid. She has also requested they only keep her on the machine for 3 hours, as she feels being on the machine longer is causing her to go into afib. She holds her dose of torsemide  until after she returns home on dialysis days. Suggested she try taking 20 mg instead of 40 mg and she agrees to try.  - Continue metoprolol , hydralazine , torsemide  40 mg on nondialysis days and 20 mg on dialysis days.  Patient not a candidate for SGLT2i or MRA secondary to kidney function. - Volume managed by hemodialysis.   Palpitations/PAF Onset July 2024.  14-day ZIO September 2024 showed HR 45 to 154 bpm, average 56 bpm, 2 runs of NSVT, 43 runs of SVT, rare ectopy.  Denies spontaneous bleeding concerns.  Patient reports episodes of palpitations were related to being on the dialysis machine for >3 hours. She will occasionally have palpitations with exertion that resolve with rest. RRR on exam today.  - 7 day Zio.  -Continue metoprolol , Eliquis . Appropriate Eliquis  dose.   Hypertension BP today 156/64. Home SBP typically 130-140 on dialysis days and 140-150 on non dialysis days.  No report of headaches or dizziness.  -Continue amlodipine , hydralazine , metoprolol .    ESRD Undergoing hemodialysis MWF. She continues to make urine.  Patient has requested staying on the machine for no more than 3 hours (see above).  -Continue to follow with nephrology.  Disposition: 7 day Zio. Reduce torsemide  to 20 mg on dialysis days and continue 40 mg on nondialysis days. Return in 3 months or sooner as needed.          Signed, Barnie HERO. Rilla Buckman, DNP, NP-C  "

## 2024-10-30 ENCOUNTER — Ambulatory Visit: Admitting: Student

## 2024-10-30 ENCOUNTER — Ambulatory Visit: Attending: Student | Admitting: Student

## 2024-10-30 ENCOUNTER — Encounter: Payer: Self-pay | Admitting: Student

## 2024-10-30 ENCOUNTER — Ambulatory Visit

## 2024-10-30 VITALS — BP 156/64 | HR 77 | Ht 62.0 in | Wt 175.6 lb

## 2024-10-30 DIAGNOSIS — Z992 Dependence on renal dialysis: Secondary | ICD-10-CM | POA: Diagnosis present

## 2024-10-30 DIAGNOSIS — R002 Palpitations: Secondary | ICD-10-CM | POA: Diagnosis present

## 2024-10-30 DIAGNOSIS — N186 End stage renal disease: Secondary | ICD-10-CM | POA: Insufficient documentation

## 2024-10-30 DIAGNOSIS — I2510 Atherosclerotic heart disease of native coronary artery without angina pectoris: Secondary | ICD-10-CM | POA: Diagnosis present

## 2024-10-30 DIAGNOSIS — I5032 Chronic diastolic (congestive) heart failure: Secondary | ICD-10-CM | POA: Diagnosis present

## 2024-10-30 DIAGNOSIS — I480 Paroxysmal atrial fibrillation: Secondary | ICD-10-CM | POA: Diagnosis present

## 2024-10-30 DIAGNOSIS — I10 Essential (primary) hypertension: Secondary | ICD-10-CM | POA: Diagnosis present

## 2024-10-30 MED ORDER — TORSEMIDE 20 MG PO TABS: ORAL_TABLET | ORAL | Status: AC

## 2024-10-30 NOTE — Patient Instructions (Signed)
 Medication Instructions:  Torsemide  20 MG take 1 tablet on M-W-F (dialysis days) and take 40 MG ( 2 tablets ) on Tues, Thurs, Sat and Sun.  *If you need a refill on your cardiac medications before your next appointment, please call your pharmacy*  Lab Work: No labs ordered today  If you have labs (blood work) drawn today and your tests are completely normal, you will receive your results only by: MyChart Message (if you have MyChart) OR A paper copy in the mail If you have any lab test that is abnormal or we need to change your treatment, we will call you to review the results.  Testing/Procedures: Your physician has recommended that you wear a Zio monitor for 7 days.   This monitor is a medical device that records the hearts electrical activity. Doctors most often use these monitors to diagnose arrhythmias. Arrhythmias are problems with the speed or rhythm of the heartbeat. The monitor is a small device applied to your chest. You can wear one while you do your normal daily activities. While wearing this monitor if you have any symptoms to push the button and record what you felt. Once you have worn this monitor for the period of time provider prescribed (Usually 14 days), you will return the monitor device in the postage paid box. Once it is returned they will download the data collected and provide us  with a report which the provider will then review and we will call you with those results. Important tips:  Avoid showering during the first 24 hours of wearing the monitor. Avoid excessive sweating to help maximize wear time. Do not submerge the device, no hot tubs, and no swimming pools. Keep any lotions or oils away from the patch. After 24 hours you may shower with the patch on. Take brief showers with your back facing the shower head.  Do not remove patch once it has been placed because that will interrupt data and decrease adhesive wear time. Push the button when you have any symptoms and  write down what you were feeling. Once you have completed wearing your monitor, remove and place into box which has postage paid and place in your outgoing mailbox.  If for some reason you have misplaced your box then call our office and we can provide another box and/or mail it off for you.   Follow-Up: At [facility], you and your health needs are our priority.  As part of our continuing mission to provide you with exceptional heart care, our providers are all part of one team.  This team includes your primary Cardiologist (physician) and Advanced Practice Providers or APPs (Physician Assistants and Nurse Practitioners) who all work together to provide you with the care you need, when you need it.  Your next appointment:   3 month(s)  Provider:   You may see Deatrice Cage, MD or one of the following Advanced Practice Providers on your designated Care Team:   Lonni Meager, NP Lesley Maffucci, PA-C Bernardino Bring, PA-C Cadence Colton, PA-C Tylene Lunch, NP Barnie Hila, NP    We recommend signing up for the patient portal called MyChart.  Sign up information is provided on this After Visit Summary.  MyChart is used to connect with patients for Virtual Visits (Telemedicine).  Patients are able to view lab/test results, encounter notes, upcoming appointments, etc.  Non-urgent messages can be sent to your provider as well.   To learn more about what you can do with MyChart, go to  [email or web address].

## 2024-11-03 ENCOUNTER — Other Ambulatory Visit: Payer: Self-pay

## 2024-11-03 ENCOUNTER — Inpatient Hospital Stay
Admission: EM | Admit: 2024-11-03 | Discharge: 2024-11-06 | DRG: 291 | Disposition: A | Attending: Obstetrics and Gynecology | Admitting: Obstetrics and Gynecology

## 2024-11-03 ENCOUNTER — Emergency Department

## 2024-11-03 ENCOUNTER — Telehealth: Payer: Self-pay | Admitting: Pharmacist

## 2024-11-03 DIAGNOSIS — F32A Depression, unspecified: Secondary | ICD-10-CM

## 2024-11-03 DIAGNOSIS — F329 Major depressive disorder, single episode, unspecified: Secondary | ICD-10-CM | POA: Diagnosis present

## 2024-11-03 DIAGNOSIS — Z7901 Long term (current) use of anticoagulants: Secondary | ICD-10-CM

## 2024-11-03 DIAGNOSIS — D509 Iron deficiency anemia, unspecified: Secondary | ICD-10-CM | POA: Diagnosis present

## 2024-11-03 DIAGNOSIS — I152 Hypertension secondary to endocrine disorders: Secondary | ICD-10-CM

## 2024-11-03 DIAGNOSIS — Z833 Family history of diabetes mellitus: Secondary | ICD-10-CM

## 2024-11-03 DIAGNOSIS — I5033 Acute on chronic diastolic (congestive) heart failure: Secondary | ICD-10-CM | POA: Diagnosis present

## 2024-11-03 DIAGNOSIS — J9601 Acute respiratory failure with hypoxia: Secondary | ICD-10-CM

## 2024-11-03 DIAGNOSIS — G4733 Obstructive sleep apnea (adult) (pediatric): Secondary | ICD-10-CM | POA: Diagnosis present

## 2024-11-03 DIAGNOSIS — Z8419 Family history of other disorders of kidney and ureter: Secondary | ICD-10-CM

## 2024-11-03 DIAGNOSIS — K2100 Gastro-esophageal reflux disease with esophagitis, without bleeding: Secondary | ICD-10-CM | POA: Diagnosis present

## 2024-11-03 DIAGNOSIS — Z66 Do not resuscitate: Secondary | ICD-10-CM | POA: Diagnosis present

## 2024-11-03 DIAGNOSIS — E119 Type 2 diabetes mellitus without complications: Secondary | ICD-10-CM

## 2024-11-03 DIAGNOSIS — E1159 Type 2 diabetes mellitus with other circulatory complications: Secondary | ICD-10-CM | POA: Diagnosis present

## 2024-11-03 DIAGNOSIS — K219 Gastro-esophageal reflux disease without esophagitis: Secondary | ICD-10-CM | POA: Diagnosis present

## 2024-11-03 DIAGNOSIS — J449 Chronic obstructive pulmonary disease, unspecified: Secondary | ICD-10-CM | POA: Diagnosis present

## 2024-11-03 DIAGNOSIS — Z90711 Acquired absence of uterus with remaining cervical stump: Secondary | ICD-10-CM

## 2024-11-03 DIAGNOSIS — J4489 Other specified chronic obstructive pulmonary disease: Secondary | ICD-10-CM | POA: Diagnosis present

## 2024-11-03 DIAGNOSIS — E890 Postprocedural hypothyroidism: Secondary | ICD-10-CM | POA: Diagnosis present

## 2024-11-03 DIAGNOSIS — Z85828 Personal history of other malignant neoplasm of skin: Secondary | ICD-10-CM

## 2024-11-03 DIAGNOSIS — K7581 Nonalcoholic steatohepatitis (NASH): Secondary | ICD-10-CM | POA: Diagnosis present

## 2024-11-03 DIAGNOSIS — J9622 Acute and chronic respiratory failure with hypercapnia: Secondary | ICD-10-CM | POA: Diagnosis present

## 2024-11-03 DIAGNOSIS — N2581 Secondary hyperparathyroidism of renal origin: Secondary | ICD-10-CM | POA: Diagnosis present

## 2024-11-03 DIAGNOSIS — J810 Acute pulmonary edema: Secondary | ICD-10-CM | POA: Diagnosis not present

## 2024-11-03 DIAGNOSIS — Z6831 Body mass index (BMI) 31.0-31.9, adult: Secondary | ICD-10-CM

## 2024-11-03 DIAGNOSIS — J9621 Acute and chronic respiratory failure with hypoxia: Secondary | ICD-10-CM | POA: Diagnosis present

## 2024-11-03 DIAGNOSIS — Z825 Family history of asthma and other chronic lower respiratory diseases: Secondary | ICD-10-CM

## 2024-11-03 DIAGNOSIS — Z1152 Encounter for screening for COVID-19: Secondary | ICD-10-CM

## 2024-11-03 DIAGNOSIS — I169 Hypertensive crisis, unspecified: Secondary | ICD-10-CM | POA: Diagnosis present

## 2024-11-03 DIAGNOSIS — Z8249 Family history of ischemic heart disease and other diseases of the circulatory system: Secondary | ICD-10-CM

## 2024-11-03 DIAGNOSIS — Z7951 Long term (current) use of inhaled steroids: Secondary | ICD-10-CM

## 2024-11-03 DIAGNOSIS — Z9981 Dependence on supplemental oxygen: Secondary | ICD-10-CM

## 2024-11-03 DIAGNOSIS — E66811 Obesity, class 1: Secondary | ICD-10-CM | POA: Diagnosis present

## 2024-11-03 DIAGNOSIS — E785 Hyperlipidemia, unspecified: Secondary | ICD-10-CM | POA: Diagnosis present

## 2024-11-03 DIAGNOSIS — I480 Paroxysmal atrial fibrillation: Secondary | ICD-10-CM | POA: Diagnosis present

## 2024-11-03 DIAGNOSIS — E1122 Type 2 diabetes mellitus with diabetic chronic kidney disease: Secondary | ICD-10-CM | POA: Diagnosis present

## 2024-11-03 DIAGNOSIS — I493 Ventricular premature depolarization: Secondary | ICD-10-CM | POA: Diagnosis present

## 2024-11-03 DIAGNOSIS — D631 Anemia in chronic kidney disease: Secondary | ICD-10-CM | POA: Diagnosis present

## 2024-11-03 DIAGNOSIS — Z79899 Other long term (current) drug therapy: Secondary | ICD-10-CM

## 2024-11-03 DIAGNOSIS — Z8261 Family history of arthritis: Secondary | ICD-10-CM

## 2024-11-03 DIAGNOSIS — N186 End stage renal disease: Secondary | ICD-10-CM | POA: Diagnosis present

## 2024-11-03 DIAGNOSIS — Z5982 Transportation insecurity: Secondary | ICD-10-CM

## 2024-11-03 DIAGNOSIS — I2721 Secondary pulmonary arterial hypertension: Secondary | ICD-10-CM | POA: Diagnosis present

## 2024-11-03 DIAGNOSIS — I132 Hypertensive heart and chronic kidney disease with heart failure and with stage 5 chronic kidney disease, or end stage renal disease: Principal | ICD-10-CM | POA: Diagnosis present

## 2024-11-03 DIAGNOSIS — R5381 Other malaise: Secondary | ICD-10-CM | POA: Diagnosis present

## 2024-11-03 DIAGNOSIS — Z803 Family history of malignant neoplasm of breast: Secondary | ICD-10-CM

## 2024-11-03 DIAGNOSIS — E039 Hypothyroidism, unspecified: Secondary | ICD-10-CM | POA: Diagnosis present

## 2024-11-03 DIAGNOSIS — M810 Age-related osteoporosis without current pathological fracture: Secondary | ICD-10-CM | POA: Diagnosis present

## 2024-11-03 DIAGNOSIS — Z79890 Hormone replacement therapy (postmenopausal): Secondary | ICD-10-CM

## 2024-11-03 DIAGNOSIS — Z992 Dependence on renal dialysis: Secondary | ICD-10-CM

## 2024-11-03 LAB — RESP PANEL BY RT-PCR (RSV, FLU A&B, COVID)  RVPGX2
Influenza A by PCR: NEGATIVE
Influenza B by PCR: NEGATIVE
Resp Syncytial Virus by PCR: NEGATIVE
SARS Coronavirus 2 by RT PCR: NEGATIVE

## 2024-11-03 LAB — CBC WITH DIFFERENTIAL/PLATELET
Abs Immature Granulocytes: 0.03 10*3/uL (ref 0.00–0.07)
Basophils Absolute: 0.1 10*3/uL (ref 0.0–0.1)
Basophils Relative: 1 %
Eosinophils Absolute: 0.6 10*3/uL — ABNORMAL HIGH (ref 0.0–0.5)
Eosinophils Relative: 7 %
HCT: 35.6 % — ABNORMAL LOW (ref 36.0–46.0)
Hemoglobin: 11.1 g/dL — ABNORMAL LOW (ref 12.0–15.0)
Immature Granulocytes: 0 %
Lymphocytes Relative: 29 %
Lymphs Abs: 2.4 10*3/uL (ref 0.7–4.0)
MCH: 28.8 pg (ref 26.0–34.0)
MCHC: 31.2 g/dL (ref 30.0–36.0)
MCV: 92.2 fL (ref 80.0–100.0)
Monocytes Absolute: 0.5 10*3/uL (ref 0.1–1.0)
Monocytes Relative: 7 %
Neutro Abs: 4.7 10*3/uL (ref 1.7–7.7)
Neutrophils Relative %: 56 %
Platelets: 204 10*3/uL (ref 150–400)
RBC: 3.86 MIL/uL — ABNORMAL LOW (ref 3.87–5.11)
RDW: 17.3 % — ABNORMAL HIGH (ref 11.5–15.5)
WBC: 8.4 10*3/uL (ref 4.0–10.5)
nRBC: 0 % (ref 0.0–0.2)

## 2024-11-03 LAB — COMPREHENSIVE METABOLIC PANEL WITH GFR
ALT: 20 U/L (ref 0–44)
AST: 35 U/L (ref 15–41)
Albumin: 4.2 g/dL (ref 3.5–5.0)
Alkaline Phosphatase: 140 U/L — ABNORMAL HIGH (ref 38–126)
Anion gap: 18 — ABNORMAL HIGH (ref 5–15)
BUN: 53 mg/dL — ABNORMAL HIGH (ref 8–23)
CO2: 22 mmol/L (ref 22–32)
Calcium: 8.7 mg/dL — ABNORMAL LOW (ref 8.9–10.3)
Chloride: 102 mmol/L (ref 98–111)
Creatinine, Ser: 3.18 mg/dL — ABNORMAL HIGH (ref 0.44–1.00)
GFR, Estimated: 14 mL/min — ABNORMAL LOW
Glucose, Bld: 164 mg/dL — ABNORMAL HIGH (ref 70–99)
Potassium: 3.6 mmol/L (ref 3.5–5.1)
Sodium: 142 mmol/L (ref 135–145)
Total Bilirubin: 0.7 mg/dL (ref 0.0–1.2)
Total Protein: 7.6 g/dL (ref 6.5–8.1)

## 2024-11-03 LAB — BLOOD GAS, VENOUS
Acid-Base Excess: 1.3 mmol/L (ref 0.0–2.0)
Bicarbonate: 27.2 mmol/L (ref 20.0–28.0)
Delivery systems: POSITIVE
FIO2: 40 %
O2 Saturation: 68.4 %
Patient temperature: 37
pCO2, Ven: 47 mmHg (ref 44–60)
pH, Ven: 7.37 (ref 7.25–7.43)
pO2, Ven: 42 mmHg (ref 32–45)

## 2024-11-03 LAB — HEPATITIS B SURFACE ANTIGEN
Hepatitis B Surface Ag: NONREACTIVE
Hepatitis B Surface Ag: NONREACTIVE

## 2024-11-03 LAB — TROPONIN T, HIGH SENSITIVITY
Troponin T High Sensitivity: 58 ng/L — ABNORMAL HIGH (ref 0–19)
Troponin T High Sensitivity: 71 ng/L — ABNORMAL HIGH (ref 0–19)

## 2024-11-03 LAB — PRO BRAIN NATRIURETIC PEPTIDE: Pro Brain Natriuretic Peptide: 25618 pg/mL — ABNORMAL HIGH

## 2024-11-03 LAB — CBG MONITORING, ED: Glucose-Capillary: 131 mg/dL — ABNORMAL HIGH (ref 70–99)

## 2024-11-03 LAB — GLUCOSE, CAPILLARY
Glucose-Capillary: 118 mg/dL — ABNORMAL HIGH (ref 70–99)
Glucose-Capillary: 94 mg/dL (ref 70–99)

## 2024-11-03 MED ORDER — INSULIN ASPART 100 UNIT/ML IJ SOLN
0.0000 [IU] | Freq: Three times a day (TID) | INTRAMUSCULAR | Status: DC
  Administered 2024-11-05: 1 [IU] via SUBCUTANEOUS
  Filled 2024-11-03 (×2): qty 1

## 2024-11-03 MED ORDER — METOPROLOL TARTRATE 25 MG PO TABS
12.5000 mg | ORAL_TABLET | Freq: Two times a day (BID) | ORAL | Status: DC
  Administered 2024-11-03 – 2024-11-06 (×7): 12.5 mg via ORAL
  Filled 2024-11-03 (×8): qty 1

## 2024-11-03 MED ORDER — ALBUMIN HUMAN 25 % IV SOLN
INTRAVENOUS | Status: AC
  Filled 2024-11-03: qty 100

## 2024-11-03 MED ORDER — HYDRALAZINE HCL 50 MG PO TABS
50.0000 mg | ORAL_TABLET | Freq: Three times a day (TID) | ORAL | Status: DC
  Administered 2024-11-03 – 2024-11-06 (×9): 50 mg via ORAL
  Filled 2024-11-03 (×10): qty 1

## 2024-11-03 MED ORDER — IPRATROPIUM-ALBUTEROL 0.5-2.5 (3) MG/3ML IN SOLN
6.0000 mL | Freq: Once | RESPIRATORY_TRACT | Status: AC
  Administered 2024-11-03: 6 mL via RESPIRATORY_TRACT
  Filled 2024-11-03: qty 6

## 2024-11-03 MED ORDER — CHLORHEXIDINE GLUCONATE CLOTH 2 % EX PADS
6.0000 | MEDICATED_PAD | Freq: Every day | CUTANEOUS | Status: DC
  Administered 2024-11-04 – 2024-11-05 (×2): 6 via TOPICAL
  Filled 2024-11-03: qty 6

## 2024-11-03 MED ORDER — PENTAFLUOROPROP-TETRAFLUOROETH EX AERO
INHALATION_SPRAY | CUTANEOUS | Status: AC
  Filled 2024-11-03: qty 30

## 2024-11-03 MED ORDER — LEVOTHYROXINE SODIUM 50 MCG PO TABS
150.0000 ug | ORAL_TABLET | Freq: Every day | ORAL | Status: DC
  Administered 2024-11-04 – 2024-11-06 (×3): 150 ug via ORAL
  Filled 2024-11-03: qty 3
  Filled 2024-11-03 (×3): qty 1

## 2024-11-03 MED ORDER — FUROSEMIDE 10 MG/ML IJ SOLN
80.0000 mg | Freq: Once | INTRAMUSCULAR | Status: AC
  Administered 2024-11-03: 80 mg via INTRAVENOUS
  Filled 2024-11-03: qty 8

## 2024-11-03 MED ORDER — ACETAMINOPHEN 325 MG PO TABS
650.0000 mg | ORAL_TABLET | Freq: Four times a day (QID) | ORAL | Status: DC | PRN
  Administered 2024-11-04 – 2024-11-06 (×5): 650 mg via ORAL
  Filled 2024-11-03 (×6): qty 2

## 2024-11-03 MED ORDER — NITROGLYCERIN 2 % TD OINT
1.0000 [in_us] | TOPICAL_OINTMENT | Freq: Once | TRANSDERMAL | Status: AC
  Administered 2024-11-03: 1 [in_us] via TOPICAL

## 2024-11-03 MED ORDER — IPRATROPIUM-ALBUTEROL 0.5-2.5 (3) MG/3ML IN SOLN
3.0000 mL | RESPIRATORY_TRACT | Status: DC | PRN
  Administered 2024-11-03 – 2024-11-06 (×7): 3 mL via RESPIRATORY_TRACT
  Filled 2024-11-03 (×7): qty 3

## 2024-11-03 MED ORDER — ALBUMIN HUMAN 25 % IV SOLN
25.0000 g | Freq: Once | INTRAVENOUS | Status: AC
  Administered 2024-11-03: 25 g via INTRAVENOUS

## 2024-11-03 MED ORDER — AMLODIPINE BESYLATE 5 MG PO TABS
5.0000 mg | ORAL_TABLET | Freq: Every day | ORAL | Status: DC
  Administered 2024-11-03 – 2024-11-06 (×4): 5 mg via ORAL
  Filled 2024-11-03 (×4): qty 1

## 2024-11-03 MED ORDER — ACETAMINOPHEN 650 MG RE SUPP: 650.0000 mg | Freq: Four times a day (QID) | RECTAL | Status: DC | PRN

## 2024-11-03 MED ORDER — PANTOPRAZOLE SODIUM 40 MG PO TBEC
40.0000 mg | DELAYED_RELEASE_TABLET | Freq: Every day | ORAL | Status: DC
  Administered 2024-11-03 – 2024-11-06 (×4): 40 mg via ORAL
  Filled 2024-11-03 (×5): qty 1

## 2024-11-03 MED ORDER — APIXABAN 2.5 MG PO TABS
2.5000 mg | ORAL_TABLET | Freq: Two times a day (BID) | ORAL | Status: DC
  Administered 2024-11-03 – 2024-11-06 (×7): 2.5 mg via ORAL
  Filled 2024-11-03 (×8): qty 1

## 2024-11-03 MED ORDER — FUROSEMIDE 10 MG/ML IJ SOLN
40.0000 mg | Freq: Two times a day (BID) | INTRAMUSCULAR | Status: DC
  Administered 2024-11-03: 40 mg via INTRAVENOUS
  Filled 2024-11-03 (×2): qty 4

## 2024-11-03 MED ORDER — INSULIN ASPART 100 UNIT/ML IJ SOLN: 0.0000 [IU] | Freq: Every day | INTRAMUSCULAR | Status: DC

## 2024-11-03 MED ORDER — FLUTICASONE FUROATE-VILANTEROL 200-25 MCG/ACT IN AEPB
1.0000 | INHALATION_SPRAY | Freq: Every day | RESPIRATORY_TRACT | Status: DC
  Administered 2024-11-03 – 2024-11-06 (×4): 1 via RESPIRATORY_TRACT
  Filled 2024-11-03: qty 28

## 2024-11-03 MED ORDER — HYDROCODONE-ACETAMINOPHEN 5-325 MG PO TABS: 1.0000 | ORAL_TABLET | ORAL | Status: DC | PRN

## 2024-11-03 MED ORDER — BUDESONIDE 0.25 MG/2ML IN SUSP
0.2500 mg | Freq: Two times a day (BID) | RESPIRATORY_TRACT | Status: DC
  Administered 2024-11-03: 0.25 mg via RESPIRATORY_TRACT
  Filled 2024-11-03: qty 2

## 2024-11-03 MED ORDER — ONDANSETRON HCL 4 MG PO TABS: 4.0000 mg | ORAL_TABLET | Freq: Four times a day (QID) | ORAL | Status: DC | PRN

## 2024-11-03 MED ORDER — SERTRALINE HCL 50 MG PO TABS
100.0000 mg | ORAL_TABLET | Freq: Every day | ORAL | Status: DC
  Administered 2024-11-03 – 2024-11-06 (×4): 100 mg via ORAL
  Filled 2024-11-03 (×5): qty 2

## 2024-11-03 MED ORDER — LACTATED RINGERS IV BOLUS: 2000.0000 mL | Freq: Once | INTRAVENOUS | Status: DC

## 2024-11-03 MED ORDER — ROSUVASTATIN CALCIUM 10 MG PO TABS
40.0000 mg | ORAL_TABLET | Freq: Every evening | ORAL | Status: DC
  Administered 2024-11-03 – 2024-11-05 (×3): 40 mg via ORAL
  Filled 2024-11-03 (×2): qty 4
  Filled 2024-11-03: qty 2
  Filled 2024-11-03: qty 4

## 2024-11-03 MED ORDER — ONDANSETRON HCL 4 MG/2ML IJ SOLN: 4.0000 mg | Freq: Four times a day (QID) | INTRAMUSCULAR | Status: DC | PRN

## 2024-11-03 NOTE — Progress Notes (Signed)
 PT TRANSPORTED TO DIALYSIS FROM ER ON SERVO-AIR WITH NO COMPLICATIONS.

## 2024-11-03 NOTE — Progress Notes (Signed)
 Pt transported from dialysis to ER 2 on Servo-air with no complications noted.

## 2024-11-03 NOTE — Progress Notes (Signed)
 Pt transported from ER2 to 239, pt placed on 3L [place] so she could eat lunch, bipap on stby at this time. No resp distress noted at this time

## 2024-11-03 NOTE — Plan of Care (Signed)

## 2024-11-03 NOTE — Telephone Encounter (Signed)
 Consult to HF Navigation Team Placed. Unfortunately, this patient does not meet criteria given ESRD on HD and GDMT is limited. Will provide recommendations before signing off. Please feel free to reach out with any questions or medication assistance needs.  Thank you for involving the HF Navigation Team in this patient's care.  Jaun Bash, PharmD, BCPS Clinical Pharmacist 05/10/2023 12:50 PM

## 2024-11-03 NOTE — ED Notes (Signed)
 ED Provider at bedside.

## 2024-11-03 NOTE — Progress Notes (Signed)
 Hemodialysis Note:  Received patient in bed to unit. Alert and oriented. Informed consent singed and in chart.  Treatment initiated: 0848 Treatment completed: 1155  Access used: Left AVF Access issues: None  UF decreased  and 300 ml saline bolus given during dialysis due to leg cramps. Transported back to room, alert without acute distress. Report given to patient's RN.  Total UF removed: 800 ml Medications given: Albumin  25 gm IV  Post HD weight: 75.4 kg  [name]

## 2024-11-03 NOTE — Hospital Course (Addendum)
 [name]

## 2024-11-03 NOTE — Progress Notes (Signed)
 Pt had come off bipap while in dialysis to eat breakfast, pt placed back on bipap at this time to finish HD.

## 2024-11-04 ENCOUNTER — Inpatient Hospital Stay

## 2024-11-04 ENCOUNTER — Encounter: Payer: Self-pay | Admitting: Internal Medicine

## 2024-11-04 ENCOUNTER — Inpatient Hospital Stay: Admitting: Physician Assistant

## 2024-11-04 LAB — RESPIRATORY PANEL BY PCR

## 2024-11-04 LAB — BLOOD GAS, VENOUS
Acid-Base Excess: 2.4 mmol/L — ABNORMAL HIGH (ref 0.0–2.0)
Bicarbonate: 26.4 mmol/L (ref 20.0–28.0)
O2 Saturation: 93.4 %
Patient temperature: 37
pCO2, Ven: 38 mmHg — ABNORMAL LOW (ref 44–60)
pH, Ven: 7.45 — ABNORMAL HIGH (ref 7.25–7.43)
pO2, Ven: 65 mmHg — ABNORMAL HIGH (ref 32–45)

## 2024-11-04 LAB — BASIC METABOLIC PANEL WITH GFR
Anion gap: 15 (ref 5–15)
BUN: 42 mg/dL — ABNORMAL HIGH (ref 8–23)
CO2: 26 mmol/L (ref 22–32)
Calcium: 8.9 mg/dL (ref 8.9–10.3)
Chloride: 97 mmol/L — ABNORMAL LOW (ref 98–111)
Creatinine, Ser: 2.83 mg/dL — ABNORMAL HIGH (ref 0.44–1.00)
GFR, Estimated: 16 mL/min — ABNORMAL LOW
Glucose, Bld: 102 mg/dL — ABNORMAL HIGH (ref 70–99)
Potassium: 3.7 mmol/L (ref 3.5–5.1)
Sodium: 138 mmol/L (ref 135–145)

## 2024-11-04 LAB — GLUCOSE, CAPILLARY
Glucose-Capillary: 107 mg/dL — ABNORMAL HIGH (ref 70–99)
Glucose-Capillary: 89 mg/dL (ref 70–99)
Glucose-Capillary: 111 mg/dL — ABNORMAL HIGH (ref 70–99)
Glucose-Capillary: 191 mg/dL — ABNORMAL HIGH (ref 70–99)

## 2024-11-04 MED ORDER — PREDNISONE 20 MG PO TABS
40.0000 mg | ORAL_TABLET | Freq: Every day | ORAL | Status: DC
  Administered 2024-11-04 – 2024-11-06 (×3): 40 mg via ORAL
  Filled 2024-11-04 (×3): qty 2

## 2024-11-04 MED ORDER — PHENOL 1.4 % MT LIQD
2.0000 | OROMUCOSAL | Status: DC | PRN
  Filled 2024-11-04: qty 177

## 2024-11-04 NOTE — TOC Initial Note (Signed)
 Transition of Care [facility]) - Initial/Assessment Note    Patient Details  Name: [name] MRN: [id number] Date of Birth: Nov 30, 1940  [facility]) CM/SW Contact:    Shasta DELENA Daring, RN Phone Number: 11/04/2024, 5:43 PM  Clinical Narrative:                 RNCM met with patient in hospital room. Introduced self and explained role. Patient lives alone in [place]. She drives herself or calls family for transportation. [facility]acy is [facility] [place]. Can afford most meds, but says some are too expensive.  No HH services at this time.  Has wheelchair, walker and cane. Is requesting new BSC.    Will enter narrative for 3 in 1 and request order from MD.  Will follow for additional TOC needs.   Expected Discharge Plan: Home/Self Care Barriers to Discharge: Continued Medical Work up   Patient Goals and CMS Choice            Expected Discharge Plan and Services   Discharge Planning Services: CM Consult   Living arrangements for the past 2 months: Single Family Home                   DME Agency: [employer]                  Prior Living Arrangements/Services Living arrangements for the past 2 months: Single Family Home Lives with:: Self Patient language and need for interpreter reviewed:: Yes Do you feel safe going back to the place where you live?: Yes      Need for Family Participation in Patient Care: Yes (Comment) Care giver support system in place?: Yes (comment)   Criminal Activity/Legal Involvement Pertinent to Current Situation/Hospitalization: No - Comment as needed  Activities of Daily Living   ADL Screening (condition at time of admission) Independently performs ADLs?: Yes (appropriate for developmental age) Is the patient deaf or have difficulty hearing?: No Does the patient have difficulty seeing, even when wearing glasses/contacts?: No Does the patient have difficulty concentrating, remembering, or making decisions?: No  Permission  Sought/Granted Permission sought to share information with : Case Manager Permission granted to share information with : Yes, Verbal Permission Granted     Permission granted to share info w AGENCY: [employer]        Emotional Assessment Appearance:: Appears stated age Attitude/Demeanor/Rapport: Gracious, Engaged Affect (typically observed): Appropriate Orientation: : Oriented to Self, Oriented to Place, Oriented to  Time, Oriented to Situation Alcohol / Substance Use: Not Applicable Psych Involvement: No (comment)  Admission diagnosis:  Hypertensive crisis [I16.9] Acute pulmonary edema (HCC) [J81.0] Flash pulmonary edema (HCC) [J81.0] Gastroesophageal reflux disease without esophagitis [K21.9] Hypertension associated with diabetes (HCC) [E11.59, I15.2] Acute hypoxemic respiratory failure (HCC) [J96.01] Depression, unspecified depression type [F32.A] Patient Active Problem List   Diagnosis Date Noted   Acute pulmonary edema (HCC) 11/03/2024   CHF (congestive heart failure) (HCC) 09/29/2024   Acute gastroenteritis 08/23/2024   Acute on chronic respiratory failure with hypoxia and hypercapnia (HCC) 05/19/2024   Iron deficiency anemia 05/19/2024   Hospital discharge follow-up 05/09/2024   Anemia of chronic disease 05/09/2024   Debility 03/29/2024   Acute dyspnea 03/09/2024   ESRD on dialysis (HCC) 12/30/2023   CAP (community acquired pneumonia) 12/21/2023   COPD with acute exacerbation (HCC) 12/21/2023   Acute respiratory failure with hypoxia (HCC) 12/21/2023   Paroxysmal atrial fibrillation (HCC) 12/21/2023   GERD without esophagitis 12/21/2023   Atherosclerosis  of native arteries of the extremities with ulceration (HCC) 12/04/2023   UTI (urinary tract infection) 09/24/2023   Traumatic hematoma of lower leg with infection, right, subsequent encounter 07/30/2023   Hypokalemia 07/30/2023   Elevated troponin 07/30/2023   PAF (paroxysmal atrial fibrillation) (HCC)  06/03/2023   Hypercalcemia 04/16/2023   Hypermagnesemia 04/16/2023   HLD (hyperlipidemia) 04/16/2023   COPD (chronic obstructive pulmonary disease) (HCC) 04/16/2023   Anemia in chronic kidney disease 03/27/2023   Electrolyte abnormality 03/24/2023   CKD (chronic kidney disease) stage 5, 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   Hyperparathyroidism, unspecified 08/05/2022   Metabolic acidosis 08/02/2022   Uremia of renal origin 08/02/2022   Uremia 08/02/2022   Acute metabolic encephalopathy 08/02/2022   Acute on chronic diastolic CHF (congestive heart failure) (HCC) 08/02/2022   Chronic bronchitis (HCC) 08/02/2022   Anemia of chronic kidney failure, stage 5 (HCC) 08/02/2022   Dizziness s/p fall 08/02/2022   AKI (acute kidney injury) 06/12/2022   Hypomagnesemia 06/12/2022   Hyperphosphatemia 06/12/2022   Pneumonia due to COVID-19 virus 06/10/2022   Hypocalcemia, symptomatic 06/10/2022   Dyslipidemia 06/10/2022   Depression 06/10/2022   Right shoulder pain 06/10/2022   Generalized weakness    Nausea vomiting and diarrhea    Chronic obstructive pulmonary disease, unspecified (HCC) 10/02/2021   Gouty arthropathy, chronic, 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   Hypothyroidism 09/01/2019   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/25/2019   Multinodular goiter 07/31/2019   Class 2 severe obesity with serious comorbidity in adult 12/12/2018   Type 2 diabetes mellitus in patient with obesity (HCC) 09/04/2017   Bilateral lower extremity edema 09/04/2017   Gastroesophageal reflux disease without esophagitis 05/16/2017   Vomiting and diarrhea 12/29/2016   Enteritis 12/29/2016   Acute lower UTI 12/29/2016   Incomplete emptying of bladder 11/01/2016   Urge incontinence 07/14/2015   Acute renal failure superimposed on stage 4 chronic kidney disease (HCC) 08/06/2014   Renal mass, right 08/06/2014   Precordial pain 07/24/2014   Dyspnea 07/24/2014   Essential hypertension 07/24/2014   Hyperlipidemia 07/24/2014    Bladder neoplasm of uncertain malignant potential 07/16/2014   Gross hematuria 07/16/2014   Asthma 05/17/2014   Osteoporosis 05/17/2014   Steatohepatitis 05/17/2014   OSA (obstructive sleep apnea) 05/17/2014   PCP:  Dineen Channel, PA-C Pharmacy:   [facility] - [place], [place] - [address] [place] [address] Phone: [phone] Fax: [phone]     Social Drivers of Health (SDOH) Social History: SDOH Screenings   Food Insecurity: No Food Insecurity (11/03/2024)  Housing: Unknown (11/03/2024)  Transportation Needs: Unmet Transportation Needs (11/03/2024)  Utilities: Patient Declined (11/03/2024)  Alcohol Screen: Low Risk (05/27/2024)  Depression (PHQ2-9): Low Risk (08/19/2024)  Financial Resource Strain: Medium Risk (08/18/2024)  Physical Activity: Sufficiently Active (08/18/2024)  Recent Concern: Physical Activity - Insufficiently Active (05/27/2024)  Social Connections: Socially Integrated (11/03/2024)  Stress: No Stress Concern Present (08/18/2024)  Tobacco Use: Low Risk (11/03/2024)  Health Literacy: Adequate Health Literacy (05/27/2024)   SDOH Interventions:     Readmission Risk Interventions    11/04/2024    5:34 PM 05/20/2024    3:15 PM 07/30/2023   10:37 AM  Readmission Risk Prevention Plan  Transportation Screening Complete Complete Complete  Medication Review Oceanographer) Complete Complete Complete  PCP or Specialist appointment within 3-5 days of discharge Complete Complete Complete  HRI or Home Care Consult Complete Complete Complete  SW Recovery Care/Counseling Consult  Complete Complete  Palliative Care Screening Not  Applicable Not Applicable Not Applicable  Skilled Nursing Facility Not Applicable Not Applicable Not Applicable

## 2024-11-04 NOTE — Plan of Care (Signed)

## 2024-11-04 NOTE — Evaluation (Signed)
 Physical Therapy Evaluation Patient Details Name: [name] MRN: [id number] DOB: [birth year] Today's Date: 11/04/2024  History of Present Illness  [name] is an 83yoF who comes [facility] on 11/03/24 with dyspnea, admitted for flash pulmonary edema. PMH: ESRD on HD MWF, COPD, hypoTSH, AF, HTN, MDD.  Clinical Impression  Pt reports feeling better overall but remains tired. Pt continues to require supplemental O2 for saturations- AMBN 113ft on room air dropping to 83% with DOE. Pt elects to demonstrate her HEP routine at end of session. Pt back to bed for a rest after evaluation, places self back on bipap. WIll continue to follow while admitted. Anticipate smooth transition back to home at [place] with HHPT services in place.         If plan is discharge home, recommend the following: Help with stairs or ramp for entrance   Can travel by private vehicle        Equipment Recommendations None recommended by PT  Recommendations for Other Services       Functional Status Assessment Patient has had a recent decline in their functional status and demonstrates the ability to make significant improvements in function in a reasonable and predictable amount of time.     Precautions / Restrictions Precautions Precautions: Fall Restrictions Weight Bearing Restrictions Per Provider Order: No      Mobility  Bed Mobility Overal bed mobility: Modified Independent                  Transfers Overall transfer level: Modified independent                      Ambulation/Gait Ambulation/Gait assistance: Supervision Gait Distance (Feet): 140 Feet Assistive device: None Gait Pattern/deviations: WFL(Within Functional Limits)       General Gait Details: room air trial, 83% and SOB near the end  Stairs            Wheelchair Mobility     Tilt Bed    Modified Rankin (Stroke Patients Only)       Balance Overall balance assessment: Modified Independent                                            Pertinent Vitals/Pain Pain Assessment Pain Assessment: No/denies pain    Home Living Family/patient expects to be discharged to:: Private residence Living Arrangements: Alone Available Help at Discharge: Family;Neighbor;Available PRN/intermittently Type of Home: House Home Access: Stairs to enter Entrance Stairs-Rails: None Entrance Stairs-Number of Steps: 1 Alternate Level Stairs-Number of Steps: Flight Home Layout: Two level;Laundry or work area in basement;Able to live on main level with bedroom/bathroom Home Equipment: Agricultural Consultant (2 wheels);BSC/3in1;Tub bench;Grab bars - tub/shower;Wheelchair - manual Additional Comments: does not access stairs in home regularly    Prior Function Prior Level of Function : Independent/Modified Independent;Driving             Mobility Comments: Mod Ind amb community distances with a RW, SPC in home, does own grocery shopping pushing a cart, no fall history; drives  a dodge ram ADLs Comments: Ind with ADLs, niece helps with cleaning PRN     Extremity/Trunk Assessment                Communication        Cognition  Cueing       General Comments      Exercises Total Joint Exercises Standing Hip Extension: AROM, Both, Standing, 10 reps General Exercises - Lower Extremity Hip ABduction/ADduction: AROM, Both, 10 reps, Standing Straight Leg Raises: Standing, AROM, Both, 10 reps Other Exercises Other Exercises: STS from chair x9   Assessment/Plan    PT Assessment Patient needs continued PT services  PT Problem List Decreased activity tolerance       PT Treatment Interventions      PT Goals (Current goals can be found in the Care Plan section)  Acute Rehab PT Goals Patient Stated Goal: remain strong PT Goal Formulation: With patient Time For Goal Achievement: 11/18/24 Potential to Achieve Goals: Good     Frequency Min 2X/week     Co-evaluation               AM-PAC PT 6 Clicks Mobility  Outcome Measure Help needed turning from your back to your side while in a flat bed without using bedrails?: None Help needed moving from lying on your back to sitting on the side of a flat bed without using bedrails?: None Help needed moving to and from a bed to a chair (including a wheelchair)?: None Help needed standing up from a chair using your arms (e.g., wheelchair or bedside chair)?: None Help needed to walk in hospital room?: A Little Help needed climbing 3-5 steps with a railing? : A Little 6 Click Score: 22    End of Session Equipment Utilized During Treatment: Gait belt;Oxygen Activity Tolerance: Patient tolerated treatment well;Patient limited by fatigue Patient left: in bed;with call bell/phone within reach   PT Visit Diagnosis: Other abnormalities of gait and mobility (R26.89)    Time: 1030-1045 PT Time Calculation (min) (ACUTE ONLY): 15 min   Charges:   PT Evaluation $PT Eval Moderate Complexity: 1 Mod   PT General Charges $$ ACUTE PT VISIT: 1 Visit        10:53 AM, 11/04/24 Peggye JAYSON Linear, PT, DPT Physical Therapist - [facility]  [phone] (ASCOM)    Mako Pelfrey C 11/04/2024, 10:51 AM

## 2024-11-05 DIAGNOSIS — E669 Obesity, unspecified: Secondary | ICD-10-CM | POA: Diagnosis not present

## 2024-11-05 DIAGNOSIS — N184 Chronic kidney disease, stage 4 (severe): Secondary | ICD-10-CM | POA: Diagnosis not present

## 2024-11-05 DIAGNOSIS — D631 Anemia in chronic kidney disease: Secondary | ICD-10-CM

## 2024-11-05 DIAGNOSIS — J449 Chronic obstructive pulmonary disease, unspecified: Secondary | ICD-10-CM | POA: Diagnosis not present

## 2024-11-05 DIAGNOSIS — E119 Type 2 diabetes mellitus without complications: Secondary | ICD-10-CM | POA: Diagnosis not present

## 2024-11-05 DIAGNOSIS — Z992 Dependence on renal dialysis: Secondary | ICD-10-CM | POA: Diagnosis not present

## 2024-11-05 DIAGNOSIS — I5033 Acute on chronic diastolic (congestive) heart failure: Secondary | ICD-10-CM

## 2024-11-05 DIAGNOSIS — J810 Acute pulmonary edema: Secondary | ICD-10-CM | POA: Diagnosis not present

## 2024-11-05 DIAGNOSIS — J9621 Acute and chronic respiratory failure with hypoxia: Secondary | ICD-10-CM

## 2024-11-05 DIAGNOSIS — J9622 Acute and chronic respiratory failure with hypercapnia: Secondary | ICD-10-CM | POA: Diagnosis not present

## 2024-11-05 DIAGNOSIS — E039 Hypothyroidism, unspecified: Secondary | ICD-10-CM

## 2024-11-05 DIAGNOSIS — N186 End stage renal disease: Secondary | ICD-10-CM

## 2024-11-05 LAB — BASIC METABOLIC PANEL WITH GFR
Anion gap: 17 — ABNORMAL HIGH (ref 5–15)
BUN: 64 mg/dL — ABNORMAL HIGH (ref 8–23)
CO2: 22 mmol/L (ref 22–32)
Calcium: 8.7 mg/dL — ABNORMAL LOW (ref 8.9–10.3)
Chloride: 93 mmol/L — ABNORMAL LOW (ref 98–111)
Creatinine, Ser: 3.14 mg/dL — ABNORMAL HIGH (ref 0.44–1.00)
GFR, Estimated: 14 mL/min — ABNORMAL LOW
Glucose, Bld: 151 mg/dL — ABNORMAL HIGH (ref 70–99)
Potassium: 4.3 mmol/L (ref 3.5–5.1)
Sodium: 133 mmol/L — ABNORMAL LOW (ref 135–145)

## 2024-11-05 LAB — GLUCOSE, CAPILLARY
Glucose-Capillary: 127 mg/dL — ABNORMAL HIGH (ref 70–99)
Glucose-Capillary: 155 mg/dL — ABNORMAL HIGH (ref 70–99)
Glucose-Capillary: 148 mg/dL — ABNORMAL HIGH (ref 70–99)

## 2024-11-05 MED ORDER — ALBUMIN HUMAN 25 % IV SOLN
INTRAVENOUS | Status: AC
  Filled 2024-11-05: qty 50

## 2024-11-05 MED ORDER — EPOETIN ALFA-EPBX 4000 UNIT/ML IJ SOLN
INTRAMUSCULAR | Status: AC
  Filled 2024-11-05: qty 1

## 2024-11-05 MED ORDER — GUAIFENESIN-DM 100-10 MG/5ML PO SYRP
5.0000 mL | ORAL_SOLUTION | ORAL | Status: DC | PRN
  Administered 2024-11-05: 5 mL via ORAL
  Filled 2024-11-05: qty 10

## 2024-11-05 MED ORDER — ALBUMIN HUMAN 25 % IV SOLN
25.0000 g | Freq: Once | INTRAVENOUS | Status: AC
  Administered 2024-11-05: 25 g via INTRAVENOUS

## 2024-11-05 MED ORDER — LORATADINE 10 MG PO TABS
10.0000 mg | ORAL_TABLET | Freq: Every day | ORAL | Status: DC
  Administered 2024-11-05 – 2024-11-06 (×2): 10 mg via ORAL
  Filled 2024-11-05 (×2): qty 1

## 2024-11-05 MED ORDER — TORSEMIDE 20 MG PO TABS
20.0000 mg | ORAL_TABLET | ORAL | Status: DC
  Administered 2024-11-05: 20 mg via ORAL
  Filled 2024-11-05: qty 1

## 2024-11-05 MED ORDER — LORATADINE 10 MG PO TABS: 10.0000 mg | ORAL_TABLET | Freq: Every day | ORAL | Status: DC

## 2024-11-05 MED ORDER — DM-GUAIFENESIN ER 30-600 MG PO TB12
1.0000 | ORAL_TABLET | Freq: Two times a day (BID) | ORAL | Status: DC
  Administered 2024-11-05 – 2024-11-06 (×2): 1 via ORAL
  Filled 2024-11-05 (×3): qty 1

## 2024-11-05 MED ORDER — FUROSEMIDE 10 MG/ML IJ SOLN
40.0000 mg | Freq: Once | INTRAMUSCULAR | Status: AC
  Administered 2024-11-05: 40 mg via INTRAVENOUS
  Filled 2024-11-05: qty 4

## 2024-11-05 NOTE — Progress Notes (Signed)
 " PROGRESS NOTE    [name]  FMW:[id number] DOB: [birth year] DOA: 11/03/2024 PCP: Ostwalt, Janna, PA-C  Chief Complaint  Patient presents with   Respiratory Distress    Hospital Course:  [name] is an 84 year old female with ESRD on HD MWF, COPD, OSA, chronic hypoxic respiratory failure on 3 L and nighttime CPAP, diastolic CHF, hypertension, hypothyroidism, paroxysmal A-fib on Eliquis .  She was admitted with flash pulmonary edema requiring BiPAP.  Initial blood pressure 172/68 tachypneic to 36.  Patient did not miss any dialysis sessions.  CXR showed interstitial edema consistent with CHF and volume overload.  She was admitted and started on diuresis.  Subjective: Patient reports she still feeling short of breath this morning.  She reports she did not sleep well and would like to resume her home dose Lasix .  She was seen during dialysis which is concluding without issue.  Objective: Vitals:   11/05/24 1100 11/05/24 1113 11/05/24 1115 11/05/24 1213  BP: (!) 134/48 (!) 134/42 (!) 133/46 99/77  Pulse: 72 69 68 74  Resp:  18 19 18   Temp:   97.9 F (36.6 C) 97.8 F (36.6 C)  TempSrc:    Oral  SpO2: 99% 100% 100% 98%  Weight:      Height:        Intake/Output Summary (Last 24 hours) at 11/05/2024 1547 Last data filed at 11/05/2024 1441 Gross per 24 hour  Intake 420 ml  Output 1450 ml  Net -1030 ml   Filed Weights   11/05/24 0412 11/05/24 0638 11/05/24 0748  Weight: 79.4 kg (S) 78.9 kg 78.3 kg    Examination: General exam: Appears calm and comfortable, NAD  Respiratory system: No work of breathing, symmetric chest wall expansion Cardiovascular system: S1 & S2 heard, RRR.  Gastrointestinal system: Abdomen is nondistended, soft and nontender.  Neuro: Alert and oriented.  Psychiatry: Mood & affect appropriate for situation.   Assessment & Plan:  Principal Problem:   Acute pulmonary edema (HCC) Active Problems:   Acute on chronic respiratory failure with hypoxia and  hypercapnia (HCC)   Acute on chronic diastolic CHF (congestive heart failure) (HCC)   ESRD on dialysis (HCC)   COPD (chronic obstructive pulmonary disease) (HCC)   Type 2 diabetes mellitus in patient with obesity (HCC)   Hypothyroidism   Anemia in chronic kidney disease   Pulmonary edema Acute on chronic hypoxia Pulmonary arterial hypertension - Seen on CXR.  Initially required BiPAP - Continue with dialysis.  1 L removed today - Continue with home dose Lasix  on dialysis days MWF - Monitor I's and O's - At baseline requires 3 L O2.  Reports she is still more dyspneic than her baseline.  Will continue to monitor - RVP negative -- CT chest: Mild chronic subpleural reticulation in the lower lung fields.  No edema or pleural effusions.  No infiltrates.  No bronchiectasis or bronchial impactions.  Heart failure with preserved EF - Received nitroglycerin  paste in ED - Continue home dose hydralazine  metoprolol  - Continue home dose Lasix   ESRD - Denies any missed sessions outpatient - Nephrology consulted for continued HD and patient.  1 L removed today  Generalized deconditioning - Reports feeling weaker than baseline - PT consulted, home health and DME ordered  COPD - Currently receiving prednisone  and as needed DuoNebs - Continue home inhalers  Hypothyroidism - Continue Synthroid   A-fib, rate controlled - Continue home meds  Hypertension - Elevated on arrival.  Better now - Continue home meds  Depression - Continue home meds  Body mass index is 31.57 kg/m. Obesity Class 1 - Outpatient follow up for lifestyle modification and risk factor management   DVT prophylaxis: Eliquis    Code Status: Limited: Do not attempt resuscitation (DNR) -DNR-LIMITED -Do Not Intubate/DNI  Disposition: Inpatient pending clinical resolution.  Hopefully home tomorrow. HH and DME ordered   Consultants:  Treatment Team:  Consulting Physician: Dennise Capri, MD  Procedures:     Antimicrobials:  Anti-infectives (From admission, onward)    None       Data Reviewed: I have personally reviewed following labs and imaging studies CBC: Recent Labs  Lab 11/03/24 0228  WBC 8.4  NEUTROABS 4.7  HGB 11.1*  HCT 35.6*  MCV 92.2  PLT 204   Basic Metabolic Panel: Recent Labs  Lab 11/03/24 0228 11/04/24 0413 11/05/24 0423  NA 142 138 133*  K 3.6 3.7 4.3  CL 102 97* 93*  CO2 22 26 22   GLUCOSE 164* 102* 151*  BUN 53* 42* 64*  CREATININE 3.18* 2.83* 3.14*  CALCIUM  8.7* 8.9 8.7*   GFR: Estimated Creatinine Clearance: 13.2 mL/min (A) (by C-G formula based on SCr of 3.14 mg/dL (H)). Liver Function Tests: Recent Labs  Lab 11/03/24 0228  AST 35  ALT 20  ALKPHOS 140*  BILITOT 0.7  PROT 7.6  ALBUMIN  4.2   CBG: Recent Labs  Lab 11/04/24 0816 11/04/24 1230 11/04/24 1552 11/04/24 2000 11/05/24 1215  GLUCAP 107* 89 111* 191* 127*    Recent Results (from the past 240 hours)  Resp panel by RT-PCR (RSV, Flu A&B, Covid) Anterior Nasal Swab     Status: None   Collection Time: 11/03/24  2:27 AM   Specimen: Anterior Nasal Swab  Result Value Ref Range Status   SARS Coronavirus 2 by RT PCR NEGATIVE NEGATIVE Final    Comment: (NOTE) SARS-CoV-2 target nucleic acids are NOT DETECTED.  The SARS-CoV-2 RNA is generally detectable in upper respiratory specimens during the acute phase of infection. The lowest concentration of SARS-CoV-2 viral copies this assay can detect is 138 copies/mL. A negative result does not preclude SARS-Cov-2 infection and should not be used as the sole basis for treatment or other patient management decisions. A negative result may occur with  improper specimen collection/handling, submission of specimen other than nasopharyngeal swab, presence of viral mutation(s) within the areas targeted by this assay, and inadequate number of viral copies(<138 copies/mL). A negative result must be combined with clinical observations,  patient history, and epidemiological information. The expected result is Negative.  Fact Sheet for Patients:  bloggercourse.com  Fact Sheet for Healthcare Providers:  seriousbroker.it  This test is no t yet approved or cleared by the [place]  FDA and  has been authorized for detection and/or diagnosis of SARS-CoV-2 by FDA under an Emergency Use Authorization (EUA). This EUA will remain  in effect (meaning this test can be used) for the duration of the COVID-19 declaration under Section 564(b)(1) of the Act, 21 U.S.C.section 360bbb-3(b)(1), unless the authorization is terminated  or revoked sooner.       Influenza A by PCR NEGATIVE NEGATIVE Final   Influenza B by PCR NEGATIVE NEGATIVE Final    Comment: (NOTE) The Xpert Xpress SARS-CoV-2/FLU/RSV plus assay is intended as an aid in the diagnosis of influenza from Nasopharyngeal swab specimens and should not be used as a sole basis for treatment. Nasal washings and aspirates are unacceptable for Xpert Xpress SARS-CoV-2/FLU/RSV testing.  Fact Sheet for Patients: bloggercourse.com  Fact Sheet  for Healthcare Providers: seriousbroker.it  This test is not yet approved or cleared by the [place]  FDA and has been authorized for detection and/or diagnosis of SARS-CoV-2 by FDA under an Emergency Use Authorization (EUA). This EUA will remain in effect (meaning this test can be used) for the duration of the COVID-19 declaration under Section 564(b)(1) of the Act, 21 U.S.C. section 360bbb-3(b)(1), unless the authorization is terminated or revoked.     Resp Syncytial Virus by PCR NEGATIVE NEGATIVE Final    Comment: (NOTE) Fact Sheet for Patients: bloggercourse.com  Fact Sheet for Healthcare Providers: seriousbroker.it  This test is not yet approved or cleared by the [place]  [place] FDA and has been authorized for detection and/or diagnosis of SARS-CoV-2 by FDA under an Emergency Use Authorization (EUA). This EUA will remain in effect (meaning this test can be used) for the duration of the COVID-19 declaration under Section 564(b)(1) of the Act, 21 U.S.C. section 360bbb-3(b)(1), unless the authorization is terminated or revoked.  Performed at [facility], [address] [place], [place] [address]   Respiratory (~20 pathogens) panel by PCR     Status: None   Collection Time: 11/04/24  2:22 PM   Specimen: Nasopharyngeal Swab; Respiratory  Result Value Ref Range Status   Adenovirus NOT DETECTED NOT DETECTED Final   Coronavirus 229E NOT DETECTED NOT DETECTED Final    Comment: (NOTE) The Coronavirus on the Respiratory Panel, DOES NOT test for the novel  Coronavirus (2019 nCoV)    Coronavirus HKU1 NOT DETECTED NOT DETECTED Final   Coronavirus NL63 NOT DETECTED NOT DETECTED Final   Coronavirus OC43 NOT DETECTED NOT DETECTED Final   Metapneumovirus NOT DETECTED NOT DETECTED Final   Rhinovirus / Enterovirus NOT DETECTED NOT DETECTED Final   Influenza A NOT DETECTED NOT DETECTED Final   Influenza B NOT DETECTED NOT DETECTED Final   Parainfluenza Virus 1 NOT DETECTED NOT DETECTED Final   Parainfluenza Virus 2 NOT DETECTED NOT DETECTED Final   Parainfluenza Virus 3 NOT DETECTED NOT DETECTED Final   Parainfluenza Virus 4 NOT DETECTED NOT DETECTED Final   Respiratory Syncytial Virus NOT DETECTED NOT DETECTED Final   Bordetella pertussis NOT DETECTED NOT DETECTED Final   Bordetella Parapertussis NOT DETECTED NOT DETECTED Final   Chlamydophila pneumoniae NOT DETECTED NOT DETECTED Final   Mycoplasma pneumoniae NOT DETECTED NOT DETECTED Final    Comment: Performed at [facility], [address], [place], [place] [address]     Radiology Studies: CT CHEST WO CONTRAST Result Date: 11/04/2024 EXAM: CT CHEST WITHOUT CONTRAST 11/04/2024 04:07:33 PM  TECHNIQUE: CT of the chest was performed without the administration of intravenous contrast. Multiplanar reformatted images are provided for review. Automated exposure control, iterative reconstruction, and/or weight based adjustment of the mA/kV was utilized to reduce the radiation dose to as low as reasonably achievable. COMPARISON: Portable chest from yesterday; AP and lateral chest from 10/15/2024, multiple chest films from last year, and CT chest with no contrast 04/18/2023. CLINICAL HISTORY: dyspnea. Dyspnea. FINDINGS: MEDIASTINUM: Heart is moderately enlarged with a left chamber predominance, unchanged. Moderate patchy calcification of the coronary arteries and aorta. Mild scattered calcification in the great vessels. No aortic aneurysm. No pericardial effusion. Interval increase in calcification in the lower mitral ring since 2024. Stable prominent central pulmonary arteries with pulmonary trunk measuring 3 cm, consistent with at least mild pulmonary arterial hypertension. Central pulmonary veins are also prominent but no more than previously. The central airways are clear. Small hiatal hernia.  Very minimal to no thyroid  as before, consistent with prior ablation or removal. Surgical clips at the expected level of the left lobe. LYMPH NODES: Scattered right paratracheal lymph nodes, subcarinal and paratracheal space nodes up to 8 mm, unchanged. No enlarged thoracic lymph nodes identified without contrast. Axillary spaces are clear. LUNGS AND PLEURA: No interstitial edema is seen in the lungs. Coarse linear scar-like markings in the lower lobes and right apex. Mild chronic subpleural reticulation in the lower lung zones without underlying honeycombing or bronchiolectasis. Subsegmental atelectasis in the posteromedial lung bases. Calcified granuloma posteriorly in the left lower lobe. No suspicious nodule bilaterally. No pleural effusion, thickening, or pneumothorax. SOFT TISSUES/BONES: Osteopenia and mild  kyphosis of the thoracic spine. Chronic healed fractures in the right rib cage. No acute or suspicious osseous findings. No acute abnormality of the soft tissues. UPPER ABDOMEN: In the extreme upper pole of the right kidney, there is a Bosniak 1 cyst measuring 1.8 cm and -3 Hounsfield units. There is a 7.8 cm probable proteinaceous or hemorrhagic cyst just below this, Hounsfield density is 23. On the left, there is a probable proteinaceous or hemorrhagic cyst anteriorly measuring 2.6 cm, 25 Hounsfield units. Follow-up ultrasound is recommended to assess for complex versus cystic lesions. The gallbladder is absent as before. No biliary dilatation. Slight splenomegaly. No acute abnormality is seen in the upper abdomen. IMPRESSION: 1. Moderately enlarged heart with left chamber predominance and chronic central venous prominence , unchanged. 2. Stable prominent central pulmonary arteries and pulmonary trunk the latter measuring 3 cm, consistent with at least mild pulmonary arterial hypertension. This was also seen previously. 3. Probable proteinaceous or hemorrhagic renal cysts, incompletely characterized; consider ultrasound follow-up or renal protocol MRI. 4. Mild chronic subpleural reticulation in the lower lung fields . No edema or pleural effusions. No active infiltrates. No bronchiectasis or bronchial impactions. 5. 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 Electronically signed by: Francis Quam MD 11/04/2024 08:48 PM EST RP Workstation: [id number]    Scheduled Meds:  amLODipine   5 mg Oral Daily   apixaban   2.5 mg Oral BID   Chlorhexidine  Gluconate Cloth  6 each Topical Q0600   fluticasone  furoate-vilanterol  1 puff Inhalation Daily   hydrALAZINE   50 mg Oral TID   insulin  aspart  0-5 Units Subcutaneous QHS   insulin  aspart  0-6 Units Subcutaneous TID WC   levothyroxine   150 mcg Oral Q0600   loratadine   10 mg Oral Daily   metoprolol  tartrate  12.5 mg Oral BID   pantoprazole   40  mg Oral Daily   predniSONE   40 mg Oral Q breakfast   rosuvastatin   40 mg Oral QPM   sertraline   100 mg Oral Daily   torsemide   20 mg Oral Q M,W,F   Continuous Infusions:   LOS: 2 days  MDM: Patient is high risk for one or more organ failure.  They necessitate ongoing hospitalization for continued IV therapies and subsequent lab monitoring. Total time spent interpreting labs and vitals, reviewing the medical record, coordinating care amongst consultants and care team members, directly assessing and discussing care with the patient and/or family: 55 min  Manjit Bufano, DO [facility]  To contact the attending physician between 7A-7P please use Epic Chat. To contact the covering physician during after hours 7P-7A, please review Amion.  11/05/2024, 3:47 PM   *This document has been created with the assistance of dictation software. Please excuse typographical errors. *   "

## 2024-11-05 NOTE — Progress Notes (Signed)
" °   11/05/24 1115  Vitals  Temp 97.9 F (36.6 C)  BP (!) 133/46  MAP (mmHg) 73  Pulse Rate 68  ECG Heart Rate 71  Resp 19  Oxygen Therapy  SpO2 100 %  O2 Device Nasal Cannula  Patient Activity (if Appropriate) In bed  Pulse Oximetry Type Continuous  Oximetry Probe Site Changed No  During Treatment Monitoring  Blood Flow Rate (mL/min) 199 mL/min  Arterial Pressure (mmHg) -91.11 mmHg  Venous Pressure (mmHg) 130.09 mmHg  TMP (mmHg) 16.97 mmHg  Ultrafiltration Rate (mL/min) 687 mL/min  Dialysate Flow Rate (mL/min) 299 ml/min  Dialysate Potassium Concentration 3  Dialysate Calcium  Concentration 2.5  Duration of HD Treatment -hour(s) 3 hour(s)  Cumulative Fluid Removed (mL) per Treatment  1000.14  Post Treatment  Dialyzer Clearance Clear  Hemodialysis Intake (mL) 0 mL  Liters Processed 62.7  Fluid Removed (mL) 1000 mL  Tolerated HD Treatment Yes  Post-Hemodialysis Comments Patient tolerated treatment. Goal has been met. Vs have remained stable.Access is positive for thrill and bruit.No prolonged bleeding. 02 saturation of 100% on 4L or oxygen  AVG/AVF Arterial Site Held (minutes) 5 minutes  AVG/AVF Venous Site Held (minutes) 5 minutes    "

## 2024-11-05 NOTE — TOC CM/SW Note (Addendum)
 Patient is not able to walk the distance required to go the bathroom, or he/she is unable to safely negotiate stairs required to access the bathroom.  A 3in1 BSC will alleviate this problem

## 2024-11-05 NOTE — Progress Notes (Signed)
 " [facility]  ROUNDING NOTE   Subjective:   [name]  is a 84 y.o.  female is well known to our practice and receives dialysis at [facility] on a MWF schedule  She presents to ED with shortness of breath requiring CPAP. 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   Update:  Patient seen and evaluated during dialysis.   HEMODIALYSIS FLOWSHEET:  Blood Flow Rate (mL/min): 199 mL/min Arterial Pressure (mmHg): -91.11 mmHg Venous Pressure (mmHg): 130.09 mmHg TMP (mmHg): 16.97 mmHg Ultrafiltration Rate (mL/min): 687 mL/min Dialysate Flow Rate (mL/min): 299 ml/min Dialysis Fluid Bolus: Normal Saline  Concerned her blood pressure would decrease.    Objective:  Vital signs in last 24 hours:  Temp:  [97.5 F (36.4 C)-97.9 F (36.6 C)] 97.9 F (36.6 C) (02/04 1115) Pulse Rate:  [61-79] 68 (02/04 1115) Resp:  [16-28] 19 (02/04 1115) BP: (115-151)/(40-72) 133/46 (02/04 1115) SpO2:  [96 %-100 %] 100 % (02/04 1115) FiO2 (%):  [35 %] 35 % (02/04 0305) Weight:  [78.3 kg-79.4 kg] 78.3 kg (02/04 0748)  Weight change: 3.4 kg Filed Weights   11/05/24 0412 11/05/24 0638 11/05/24 0748  Weight: 79.4 kg (S) 78.9 kg 78.3 kg    Intake/Output: I/O last 3 completed shifts: In: 420 [P.O.:420] Out: 450 [Urine:450]   Intake/Output this shift:  Total I/O In: -  Out: 1000 [Other:1000]  Physical Exam: General: NAD  Head: Normocephalic, atraumatic. Moist oral mucosal membranes  Eyes: Anicteric  Lungs:  Clear, [place] O2  Heart: Regular rate and rhythm  Abdomen:  Soft, nontender  Extremities: No peripheral edema.   Neurologic: Awake, alert, conversant  Skin: Warm,dry, no rash  Access: Left upper AVF    Basic Metabolic Panel: Recent Labs  Lab 11/03/24 0228 11/04/24 0413 11/05/24 0423  NA 142 138 133*  K 3.6 3.7 4.3  CL 102 97* 93*  CO2 22 26 22   GLUCOSE 164* 102* 151*  BUN 53* 42* 64*  CREATININE 3.18* 2.83* 3.14*  CALCIUM  8.7* 8.9 8.7*    Liver Function Tests: Recent Labs  Lab 11/03/24 0228  AST 35  ALT 20  ALKPHOS 140*  BILITOT 0.7  PROT 7.6  ALBUMIN  4.2   No results for input(s): LIPASE, AMYLASE in the last 168 hours. No results for input(s): AMMONIA in the last 168 hours.  CBC: Recent Labs  Lab 11/03/24 0228  WBC 8.4  NEUTROABS 4.7  HGB 11.1*  HCT 35.6*  MCV 92.2  PLT 204    Cardiac Enzymes: No results for input(s): CKTOTAL, CKMB, CKMBINDEX, TROPONINI in the last 168 hours.  BNP: Invalid input(s): POCBNP  CBG: Recent Labs  Lab 11/03/24 2039 11/04/24 0816 11/04/24 1230 11/04/24 1552 11/04/24 2000  GLUCAP 94 107* 89 111* 191*    Microbiology: Results for orders placed or performed during the hospital encounter of 11/03/24  Resp panel by RT-PCR (RSV, Flu A&B, Covid) Anterior Nasal Swab     Status: None   Collection Time: 11/03/24  2:27 AM   Specimen: Anterior Nasal Swab  Result Value Ref Range Status   SARS Coronavirus 2 by RT PCR NEGATIVE NEGATIVE Final    Comment: (NOTE) SARS-CoV-2 target nucleic acids are NOT DETECTED.  The SARS-CoV-2 RNA is generally  detectable in upper respiratory specimens during the acute phase of infection. The lowest concentration of SARS-CoV-2 viral copies this assay can detect is 138 copies/mL. A negative result does not preclude SARS-Cov-2 infection and should not be used as the sole basis for treatment or other patient management decisions. A negative result may occur with  improper specimen collection/handling, submission of specimen other than nasopharyngeal swab, presence of viral mutation(s)  within the areas targeted by this assay, and inadequate number of viral copies(<138 copies/mL). A negative result must be combined with clinical observations, patient history, and epidemiological information. The expected result is Negative.  Fact Sheet for Patients:  bloggercourse.com  Fact Sheet for Healthcare Providers:  seriousbroker.it  This test is no t yet approved or cleared by the [place]  FDA and  has been authorized for detection and/or diagnosis of SARS-CoV-2 by FDA under an Emergency Use Authorization (EUA). This EUA will remain  in effect (meaning this test can be used) for the duration of the COVID-19 declaration under Section 564(b)(1) of the Act, 21 U.S.C.section 360bbb-3(b)(1), unless the authorization is terminated  or revoked sooner.       Influenza A by PCR NEGATIVE NEGATIVE Final   Influenza B by PCR NEGATIVE NEGATIVE Final    Comment: (NOTE) The Xpert Xpress SARS-CoV-2/FLU/RSV plus assay is intended as an aid in the diagnosis of influenza from Nasopharyngeal swab specimens and should not be used as a sole basis for treatment. Nasal washings and aspirates are unacceptable for Xpert Xpress SARS-CoV-2/FLU/RSV testing.  Fact Sheet for Patients: bloggercourse.com  Fact Sheet for Healthcare Providers: seriousbroker.it  This test is not yet approved or cleared by the [place]  FDA and has been authorized for detection and/or diagnosis of SARS-CoV-2 by FDA under an Emergency Use Authorization (EUA). This EUA will remain in effect (meaning this test can be used) for the duration of the COVID-19 declaration under Section 564(b)(1) of the Act, 21 U.S.C. section 360bbb-3(b)(1), unless the authorization is terminated or revoked.     Resp Syncytial Virus by PCR NEGATIVE NEGATIVE Final    Comment: (NOTE) Fact Sheet for  Patients: bloggercourse.com  Fact Sheet for Healthcare Providers: seriousbroker.it  This test is not yet approved or cleared by the [place]  FDA and has been authorized for detection and/or diagnosis of SARS-CoV-2 by FDA under an Emergency Use Authorization (EUA). This EUA will remain in effect (meaning this test can be used) for the duration of the COVID-19 declaration under Section 564(b)(1) of the Act, 21 U.S.C. section 360bbb-3(b)(1), unless the authorization is terminated or revoked.  Performed at [facility], [address] [place], [place] [address]   Respiratory (~20 pathogens) panel by PCR     Status: None   Collection Time: 11/04/24  2:22 PM   Specimen: Nasopharyngeal Swab; Respiratory  Result Value Ref Range Status   Adenovirus NOT DETECTED NOT DETECTED Final   Coronavirus 229E NOT DETECTED NOT DETECTED Final    Comment: (NOTE) The Coronavirus on the Respiratory Panel, DOES NOT test for the novel  Coronavirus (2019 nCoV)    Coronavirus HKU1 NOT DETECTED NOT DETECTED Final   Coronavirus NL63 NOT DETECTED NOT DETECTED Final   Coronavirus OC43 NOT DETECTED NOT DETECTED Final   Metapneumovirus NOT DETECTED NOT DETECTED Final   Rhinovirus / Enterovirus NOT DETECTED NOT DETECTED Final   Influenza A NOT DETECTED NOT DETECTED Final   Influenza B NOT DETECTED NOT DETECTED Final   Parainfluenza Virus 1 NOT DETECTED NOT DETECTED Final  Parainfluenza Virus 2 NOT DETECTED NOT DETECTED Final   Parainfluenza Virus 3 NOT DETECTED NOT DETECTED Final   Parainfluenza Virus 4 NOT DETECTED NOT DETECTED Final   Respiratory Syncytial Virus NOT DETECTED NOT DETECTED Final   Bordetella pertussis NOT DETECTED NOT DETECTED Final   Bordetella Parapertussis NOT DETECTED NOT DETECTED Final   Chlamydophila pneumoniae NOT DETECTED NOT DETECTED Final   Mycoplasma pneumoniae NOT DETECTED NOT DETECTED Final    Comment: Performed at  [facility], [address], [place], [place] [address]    Coagulation Studies: No results for input(s): LABPROT, INR in the last 72 hours.  Urinalysis: No results for input(s): COLORURINE, LABSPEC, PHURINE, GLUCOSEU, HGBUR, BILIRUBINUR, KETONESUR, PROTEINUR, UROBILINOGEN, NITRITE, LEUKOCYTESUR in the last 72 hours.  Invalid input(s): APPERANCEUR    Imaging: CT CHEST WO CONTRAST Result Date: 11/04/2024 EXAM: CT CHEST WITHOUT CONTRAST 11/04/2024 04:07:33 PM TECHNIQUE: CT of the chest was performed without the administration of intravenous contrast. Multiplanar reformatted images are provided for review. Automated exposure control, iterative reconstruction, and/or weight based adjustment of the mA/kV was utilized to reduce the radiation dose to as low as reasonably achievable. COMPARISON: Portable chest from yesterday; AP and lateral chest from 10/15/2024, multiple chest films from last year, and CT chest with no contrast 04/18/2023. CLINICAL HISTORY: dyspnea. Dyspnea. FINDINGS: MEDIASTINUM: Heart is moderately enlarged with a left chamber predominance, unchanged. Moderate patchy calcification of the coronary arteries and aorta. Mild scattered calcification in the great vessels. No aortic aneurysm. No pericardial effusion. Interval increase in calcification in the lower mitral ring since 2024. Stable prominent central pulmonary arteries with pulmonary trunk measuring 3 cm, consistent with at least mild pulmonary arterial hypertension. Central pulmonary veins are also prominent but no more than previously. The central airways are clear. Small hiatal hernia. Very minimal to no thyroid  as before, consistent with prior ablation or removal. Surgical clips at the expected level of the left lobe. LYMPH NODES: Scattered right paratracheal lymph nodes, subcarinal and paratracheal space nodes up to 8 mm, unchanged. No enlarged thoracic lymph nodes identified without contrast.  Axillary spaces are clear. LUNGS AND PLEURA: No interstitial edema is seen in the lungs. Coarse linear scar-like markings in the lower lobes and right apex. Mild chronic subpleural reticulation in the lower lung zones without underlying honeycombing or bronchiolectasis. Subsegmental atelectasis in the posteromedial lung bases. Calcified granuloma posteriorly in the left lower lobe. No suspicious nodule bilaterally. No pleural effusion, thickening, or pneumothorax. SOFT TISSUES/BONES: Osteopenia and mild kyphosis of the thoracic spine. Chronic healed fractures in the right rib cage. No acute or suspicious osseous findings. No acute abnormality of the soft tissues. UPPER ABDOMEN: In the extreme upper pole of the right kidney, there is a Bosniak 1 cyst measuring 1.8 cm and -3 Hounsfield units. There is a 7.8 cm probable proteinaceous or hemorrhagic cyst just below this, Hounsfield density is 23. On the left, there is a probable proteinaceous or hemorrhagic cyst anteriorly measuring 2.6 cm, 25 Hounsfield units. Follow-up ultrasound is recommended to assess for complex versus cystic lesions. The gallbladder is absent as before. No biliary dilatation. Slight splenomegaly. No acute abnormality is seen in the upper abdomen. IMPRESSION: 1. Moderately enlarged heart with left chamber predominance and chronic central venous prominence , unchanged. 2. Stable prominent central pulmonary arteries and pulmonary trunk the latter measuring 3 cm, consistent with at least mild pulmonary arterial hypertension. This was also seen previously. 3. Probable proteinaceous or hemorrhagic renal cysts, incompletely characterized; consider ultrasound follow-up or  renal protocol MRI. 4. Mild chronic subpleural reticulation in the lower lung fields . No edema or pleural effusions. No active infiltrates. No bronchiectasis or bronchial impactions. 5. 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 Electronically signed by: Francis Quam MD 11/04/2024 08:48 PM EST RP Workstation: [id number]     Medications:     amLODipine   5 mg Oral Daily   apixaban   2.5 mg Oral BID   Chlorhexidine  Gluconate Cloth  6 each Topical Q0600   fluticasone  furoate-vilanterol  1 puff Inhalation Daily   hydrALAZINE   50 mg Oral TID   insulin  aspart  0-5 Units Subcutaneous QHS   insulin  aspart  0-6 Units Subcutaneous TID WC   levothyroxine   150 mcg Oral Q0600   loratadine   10 mg Oral Daily   metoprolol  tartrate  12.5 mg Oral BID   pantoprazole   40 mg Oral Daily   predniSONE   40 mg Oral Q breakfast   rosuvastatin   40 mg Oral QPM   sertraline   100 mg Oral Daily   acetaminophen  **OR** acetaminophen , guaiFENesin -dextromethorphan , ipratropium-albuterol , ondansetron  **OR** ondansetron  (ZOFRAN ) IV, phenol  Assessment/ Plan:  [name] is a 84 y.o.  female  is well known to our practice and receives dialysis at [facility] on a MWF schedule   [facility]/MWF/left upper AVF  End-stage renal disease on hemodialysis.  Received dialysis today with IV albumin  for BP support. Patient was able to remove 1L of fluid during treatment. Next treatment scheduled for Friday.   2.  Acute respiratory failure requiring CPAP.  Chest x-ray shows interstitial edema with worsening CHF versus fluid volume overload, small pleural effusion forming, and moderate enlarged heart. Weaned to 2L  3. Anemia of chronic kidney disease Lab Results  Component Value Date   HGB 11.1 (L) 11/03/2024    Hgb at goal, no indication for ESA  4. Secondary Hyperparathyroidism: with outpatient labs: PTH 137, phosphorus 2.3, calcium  9.0 on 10/13/24.   Lab Results  Component Value Date   PTH 130.0 08/11/2024   CALCIUM  8.7 (L) 11/05/2024   PHOS 3.3 10/01/2024    Will continue to monitor bone minerals.    LOS: 2 Faith Harris 2/4/202611:44 AM   "

## 2024-11-05 NOTE — Plan of Care (Signed)
 Pt is alert oriented x 4. On bipap, remove for meals/ medications. Pt verbalized increase shortness of breathing. Crackles noted to right lower lobe, pt coughing frothy sputum. On call provider Foust, NP informed, received x 1 order for lasix  IV.   Problem: Education: Goal: Ability to describe self-care measures that may prevent or decrease complications (Diabetes Survival Skills Education) will improve Outcome: Progressing Goal: Individualized Educational Video(s) Outcome: Progressing   Problem: Coping: Goal: Ability to adjust to condition or change in health will improve Outcome: Progressing   Problem: Fluid Volume: Goal: Ability to maintain a balanced intake and output will improve Outcome: Progressing   Problem: Health Behavior/Discharge Planning: Goal: Ability to identify and utilize available resources and services will improve Outcome: Progressing Goal: Ability to manage health-related needs will improve Outcome: Progressing   Problem: Metabolic: Goal: Ability to maintain appropriate glucose levels will improve Outcome: Progressing   Problem: Nutritional: Goal: Maintenance of adequate nutrition will improve Outcome: Progressing Goal: Progress toward achieving an optimal weight will improve Outcome: Progressing   Problem: Skin Integrity: Goal: Risk for impaired skin integrity will decrease Outcome: Progressing   Problem: Tissue Perfusion: Goal: Adequacy of tissue perfusion will improve Outcome: Progressing   Problem: Education: Goal: Ability to demonstrate management of disease process will improve Outcome: Progressing Goal: Ability to verbalize understanding of medication therapies will improve Outcome: Progressing Goal: Individualized Educational Video(s) Outcome: Progressing   Problem: Activity: Goal: Capacity to carry out activities will improve Outcome: Progressing   Problem: Cardiac: Goal: Ability to achieve and maintain adequate cardiopulmonary  perfusion will improve Outcome: Progressing   Problem: Education: Goal: Knowledge of General Education information will improve Description: Including pain rating scale, medication(s)/side effects and non-pharmacologic comfort measures Outcome: Progressing   Problem: Health Behavior/Discharge Planning: Goal: Ability to manage health-related needs will improve Outcome: Progressing   Problem: Clinical Measurements: Goal: Ability to maintain clinical measurements within normal limits will improve Outcome: Progressing Goal: Will remain free from infection Outcome: Progressing Goal: Diagnostic test results will improve Outcome: Progressing Goal: Respiratory complications will improve Outcome: Progressing Goal: Cardiovascular complication will be avoided Outcome: Progressing   Problem: Activity: Goal: Risk for activity intolerance will decrease Outcome: Progressing   Problem: Nutrition: Goal: Adequate nutrition will be maintained Outcome: Progressing   Problem: Coping: Goal: Level of anxiety will decrease Outcome: Progressing   Problem: Elimination: Goal: Will not experience complications related to bowel motility Outcome: Progressing Goal: Will not experience complications related to urinary retention Outcome: Progressing   Problem: Pain Managment: Goal: General experience of comfort will improve and/or be controlled Outcome: Progressing   Problem: Safety: Goal: Ability to remain free from injury will improve Outcome: Progressing   Problem: Skin Integrity: Goal: Risk for impaired skin integrity will decrease Outcome: Progressing

## 2024-11-06 ENCOUNTER — Inpatient Hospital Stay: Admitting: Physician Assistant

## 2024-11-06 ENCOUNTER — Other Ambulatory Visit: Payer: Self-pay

## 2024-11-06 DIAGNOSIS — J810 Acute pulmonary edema: Secondary | ICD-10-CM | POA: Diagnosis not present

## 2024-11-06 LAB — COMPREHENSIVE METABOLIC PANEL WITH GFR
ALT: 22 U/L (ref 0–44)
AST: 26 U/L (ref 15–41)
Albumin: 4.3 g/dL (ref 3.5–5.0)
Alkaline Phosphatase: 100 U/L (ref 38–126)
Anion gap: 14 (ref 5–15)
BUN: 56 mg/dL — ABNORMAL HIGH (ref 8–23)
CO2: 26 mmol/L (ref 22–32)
Calcium: 9.2 mg/dL (ref 8.9–10.3)
Chloride: 96 mmol/L — ABNORMAL LOW (ref 98–111)
Creatinine, Ser: 2.61 mg/dL — ABNORMAL HIGH (ref 0.44–1.00)
GFR, Estimated: 18 mL/min — ABNORMAL LOW
Glucose, Bld: 111 mg/dL — ABNORMAL HIGH (ref 70–99)
Potassium: 4.1 mmol/L (ref 3.5–5.1)
Sodium: 137 mmol/L (ref 135–145)
Total Bilirubin: 0.6 mg/dL (ref 0.0–1.2)
Total Protein: 6.9 g/dL (ref 6.5–8.1)

## 2024-11-06 LAB — CBC
HCT: 30.4 % — ABNORMAL LOW (ref 36.0–46.0)
Hemoglobin: 10 g/dL — ABNORMAL LOW (ref 12.0–15.0)
MCH: 29 pg (ref 26.0–34.0)
MCHC: 32.9 g/dL (ref 30.0–36.0)
MCV: 88.1 fL (ref 80.0–100.0)
Platelets: 189 10*3/uL (ref 150–400)
RBC: 3.45 MIL/uL — ABNORMAL LOW (ref 3.87–5.11)
RDW: 16.8 % — ABNORMAL HIGH (ref 11.5–15.5)
WBC: 8 10*3/uL (ref 4.0–10.5)
nRBC: 0 % (ref 0.0–0.2)

## 2024-11-06 LAB — GLUCOSE, CAPILLARY
Glucose-Capillary: 90 mg/dL (ref 70–99)
Glucose-Capillary: 108 mg/dL — ABNORMAL HIGH (ref 70–99)

## 2024-11-06 LAB — PHOSPHORUS: Phosphorus: 3.3 mg/dL (ref 2.5–4.6)

## 2024-11-06 LAB — MAGNESIUM: Magnesium: 1.7 mg/dL (ref 1.7–2.4)

## 2024-11-06 MED ORDER — PREDNISONE 20 MG PO TABS
40.0000 mg | ORAL_TABLET | Freq: Every day | ORAL | 0 refills | Status: AC
  Filled 2024-11-06: qty 6, 3d supply, fill #0

## 2024-11-06 MED ORDER — ALBUTEROL SULFATE (2.5 MG/3ML) 0.083% IN NEBU: 2.5000 mg | INHALATION_SOLUTION | RESPIRATORY_TRACT | Status: DC | PRN

## 2024-11-06 MED ORDER — IPRATROPIUM-ALBUTEROL 0.5-2.5 (3) MG/3ML IN SOLN: 3.0000 mL | Freq: Two times a day (BID) | RESPIRATORY_TRACT | Status: DC

## 2024-11-06 NOTE — Discharge Summary (Signed)
 [address]    [name] FMW:[id number] DOB: Jan 30, 1941 DOA: 11/03/2024  PCP: Ostwalt, Janna, PA-C  Admit date: 11/03/2024 Discharge date: 11/06/2024   Recommendations for Outpatient Follow-up:  Follow up with PCP in 1-2 weeks to continue chronic condition management Keep scheduled hemodialysis, next session tomorrow  Hospital Course: [name] is an 84 year old female with ESRD on HD MWF, COPD, OSA, chronic hypoxic respiratory failure on 3 L and nighttime CPAP, diastolic CHF, hypertension, hypothyroidism, paroxysmal A-fib on Eliquis .  She was admitted with flash pulmonary edema requiring BiPAP.  Initial blood pressure 172/68 tachypneic to 36.  Patient did not miss any dialysis sessions.  CXR showed interstitial edema consistent with CHF and volume overload.  She was admitted and started on diuresis.  By 2/5 patient is back to her respiratory baseline.  She does endorse continued dyspnea on exertion but this is chronic.  She is already established with outpatient hemodialysis chair.  She does endorse some difficulty with transportation but is on multiple different wait lists.  I have discussed with TOC who reports they have provided all resources they are able for this. HH arranged prior to DC.   Pulmonary edema Acute on chronic hypoxia Pulmonary arterial hypertension - Seen on CXR.  Initially required BiPAP - Continue with dialysis.  1 L removed 2/4 - Continue with home dose Lasix   - At baseline requires 3 L O2. At baseline now  - RVP negative -- CT chest: Mild chronic subpleural reticulation in the lower lung fields.  No edema or pleural effusions.  No infiltrates.  No bronchiectasis or bronchial impactions.   Heart failure with preserved EF - Received nitroglycerin  paste in ED - Continue home dose hydralazine  metoprolol  - Continue home dose Lasix    ESRD - Denies any missed sessions outpatient - Nephrology consulted for continued HD and patient.  1 L remove 2/4    Generalized deconditioning - Reports feeling weaker than baseline - PT consulted, home health and DME arranged   COPD - Complete 4 days prednisone  - Continue home inhalers   Hypothyroidism - Continue Synthroid    A-fib, rate controlled - Continue home meds   Hypertension - Elevated on arrival.  Better now - Continue home meds   Depression - Continue home meds   Body mass index is 31.57 kg/m. Obesity Class 1 - Outpatient follow up for lifestyle modification and risk factor management  Discharge Instructions  Discharge Instructions     Call MD for:  difficulty breathing, headache or visual disturbances   Complete by: As directed    Call MD for:  persistant dizziness or light-headedness   Complete by: As directed    Call MD for:  persistant nausea and vomiting   Complete by: As directed    Call MD for:  severe uncontrolled pain   Complete by: As directed    Call MD for:  temperature >100.4   Complete by: As directed    Diet general   Complete by: As directed    Discharge instructions   Complete by: As directed    Follow up with your primary care physician to discuss the medication changes during this admission   Increase activity slowly   Complete by: As directed       Allergies as of 11/06/2024       Reactions   Ace Inhibitors    Other reaction(s): Unknown   Egg Protein-containing Drug Products Diarrhea   Other    Other reaction(s): Other (See Comments) Eggs  Prednisone     Other reaction(s): Other (See Comments) joint pain   Risedronate    Other reaction(s): Other (See Comments)   Sulfa Antibiotics Itching, Swelling   Other reaction(s): Other (See Comments)   Sulfasalazine Other (See Comments)        Medication List     TAKE these medications    albuterol  108 (90 Base) MCG/ACT inhaler Commonly known as: VENTOLIN  HFA Inhale 2 puffs into the lungs every 6 (six) hours as needed for wheezing or shortness of breath.   allopurinol  100 MG  tablet Commonly known as: ZYLOPRIM  TAKE ONE TABLET (100 MG) BY MOUTH ONCE DAILY   amLODipine  5 MG tablet Commonly known as: NORVASC  TAKE 1 TABLET BY MOUTH DAILY.   apixaban  2.5 MG Tabs tablet Commonly known as: ELIQUIS  Take 1 tablet (2.5 mg total) by mouth 2 (two) times daily.   Calcium  600 600 MG Tabs tablet Generic drug: calcium  carbonate Take 600 mg by mouth daily.   epoetin  alfa-epbx 4000 UNIT/ML injection Commonly known as: RETACRIT  Inject 1 mL (4,000 Units total) into the vein every Monday, Wednesday, and Friday at 6 PM.   esomeprazole  40 MG capsule Commonly known as: NEXIUM  TAKE 1 CAPSULE (40 MG) BY MOUTH ONCE DAILY BEFORE BREAKFAST   ferrous sulfate  325 (65 FE) MG tablet Take 325 mg by mouth daily with breakfast.   fluticasone  furoate-vilanterol 200-25 MCG/ACT Aepb Commonly known as: Breo Ellipta  Inhale 1 puff into the lungs daily. Please schedule office visit before any future refills.   folic acid  1 MG tablet Commonly known as: FOLVITE  Take 1 tablet (1 mg total) by mouth daily.   hydrALAZINE  50 MG tablet Commonly known as: APRESOLINE  Take 1 tablet (50 mg total) by mouth 3 (three) times daily. Hold off if systolic BP less than 140 mmHg   ipratropium-albuterol  0.5-2.5 (3) MG/3ML Soln Commonly known as: DUONEB INHALE THE CONTENTS OF ONE (1) VIAL VIA NEBULIZATION EVERY 6 HOURS AS NEEDED FORSHORTNESS OF BREATH   levothyroxine  150 MCG tablet Commonly known as: SYNTHROID  TAKE 1 TABLET BY MOUTH ONCE DAILY. TAKE ON AN EMPTY STOMACH WITH A GLASS OF WATER AT LEAST 30-60 MINUTES BEFORE BREAKFAST   lidocaine -prilocaine  cream Commonly known as: EMLA  as directed. Before dialysis   metoprolol  tartrate 25 MG tablet Commonly known as: LOPRESSOR  Take 0.5 tablets (12.5 mg total) by mouth 2 (two) times daily.   montelukast  10 MG tablet Commonly known as: SINGULAIR  TAKE 1 TABLET BY MOUTH NIGHTLY   nystatin powder Apply 1 Application topically 3 (three) times daily as  needed (itching).   predniSONE  20 MG tablet Commonly known as: DELTASONE  Take 2 tablets (40 mg total) by mouth daily with breakfast for 3 days. Start taking on: November 07, 2024   rosuvastatin  40 MG tablet Commonly known as: CRESTOR  TAKE ONE TABLET BY MOUTH EVERY EVENING   sertraline  100 MG tablet Commonly known as: ZOLOFT  TAKE 1 TABLET BY MOUTH DAILY   torsemide  20 MG tablet Commonly known as: DEMADEX  Take 20 MG ( 1 tablet) on M-W-F (dialysis days) take 40 MG (2 tablets) on Tues, Thur, Sat and Sun               Durable Medical Equipment  (From admission, onward)           Start     Ordered   11/05/24 1547  For home use only DME Bedside commode  Once       Question:  Patient needs a bedside commode to treat with the  following condition  Answer:  Gait disturbance   11/05/24 1546            Contact information for after-discharge care     Home Medical Care     [facility]) .   Service: Home Health Services                    Allergies[1]  Consultations: Treatment Team:  Dennise Capri, MD   Procedures/Studies: CT CHEST WO CONTRAST Result Date: 11/04/2024 EXAM: CT CHEST WITHOUT CONTRAST 11/04/2024 04:07:33 PM TECHNIQUE: CT of the chest was performed without the administration of intravenous contrast. Multiplanar reformatted images are provided for review. Automated exposure control, iterative reconstruction, and/or weight based adjustment of the mA/kV was utilized to reduce the radiation dose to as low as reasonably achievable. COMPARISON: Portable chest from yesterday; AP and lateral chest from 10/15/2024, multiple chest films from last year, and CT chest with no contrast 04/18/2023. CLINICAL HISTORY: dyspnea. Dyspnea. FINDINGS: MEDIASTINUM: Heart is moderately enlarged with a left chamber predominance, unchanged. Moderate patchy calcification of the coronary arteries and aorta. Mild scattered calcification in the great  vessels. No aortic aneurysm. No pericardial effusion. Interval increase in calcification in the lower mitral ring since 2024. Stable prominent central pulmonary arteries with pulmonary trunk measuring 3 cm, consistent with at least mild pulmonary arterial hypertension. Central pulmonary veins are also prominent but no more than previously. The central airways are clear. Small hiatal hernia. Very minimal to no thyroid  as before, consistent with prior ablation or removal. Surgical clips at the expected level of the left lobe. LYMPH NODES: Scattered right paratracheal lymph nodes, subcarinal and paratracheal space nodes up to 8 mm, unchanged. No enlarged thoracic lymph nodes identified without contrast. Axillary spaces are clear. LUNGS AND PLEURA: No interstitial edema is seen in the lungs. Coarse linear scar-like markings in the lower lobes and right apex. Mild chronic subpleural reticulation in the lower lung zones without underlying honeycombing or bronchiolectasis. Subsegmental atelectasis in the posteromedial lung bases. Calcified granuloma posteriorly in the left lower lobe. No suspicious nodule bilaterally. No pleural effusion, thickening, or pneumothorax. SOFT TISSUES/BONES: Osteopenia and mild kyphosis of the thoracic spine. Chronic healed fractures in the right rib cage. No acute or suspicious osseous findings. No acute abnormality of the soft tissues. UPPER ABDOMEN: In the extreme upper pole of the right kidney, there is a Bosniak 1 cyst measuring 1.8 cm and -3 Hounsfield units. There is a 7.8 cm probable proteinaceous or hemorrhagic cyst just below this, Hounsfield density is 23. On the left, there is a probable proteinaceous or hemorrhagic cyst anteriorly measuring 2.6 cm, 25 Hounsfield units. Follow-up ultrasound is recommended to assess for complex versus cystic lesions. The gallbladder is absent as before. No biliary dilatation. Slight splenomegaly. No acute abnormality is seen in the upper abdomen.  IMPRESSION: 1. Moderately enlarged heart with left chamber predominance and chronic central venous prominence , unchanged. 2. Stable prominent central pulmonary arteries and pulmonary trunk the latter measuring 3 cm, consistent with at least mild pulmonary arterial hypertension. This was also seen previously. 3. Probable proteinaceous or hemorrhagic renal cysts, incompletely characterized; consider ultrasound follow-up or renal protocol MRI. 4. Mild chronic subpleural reticulation in the lower lung fields . No edema or pleural effusions. No active infiltrates. No bronchiectasis or bronchial impactions. 5. 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 Electronically signed by: Francis Quam MD 11/04/2024 08:48 PM EST RP Workstation: [id number]   DG Chest Port 1  View Result Date: 11/03/2024 EXAM: 1 VIEW(S) XRAY OF THE CHEST 11/03/2024 02:41:52 AM COMPARISON: AP lateral chest 10/15/2024. CLINICAL HISTORY: shob FINDINGS: LINES, TUBES AND DEVICES: There are overlying telemetry leads. LUNGS AND PLEURA: Interstitial edema with a generalized involvement and basal gradient is again seen, slightly worsened. Small pleural effusions are forming. No focal consolidation. No pneumothorax. HEART AND MEDIASTINUM: The heart is moderately enlarged. Central vascular engorgement. The mediastinum is stable. There is calcification in the transverse aorta. BONES AND SOFT TISSUES: No acute osseous abnormality. IMPRESSION: 1. Interstitial edema with a generalized involvement and basal gradient, slightly worsened, chf or fluid overload. 2. Small pleural effusions forming. 3. Moderately enlarged heart with central vascular engorgement. Electronically signed by: Francis Quam MD 11/03/2024 02:56 AM EST RP Workstation: [id number]   DG Chest 2 View Result Date: 10/15/2024 CLINICAL DATA:  Shortness of breath EXAM: CHEST - 2 VIEW COMPARISON:  10/06/2024, 05/18/2024, 09/30/2024 FINDINGS: Similar cardiomegaly and central  vascular congestion. No consolidation or pleural effusion. Aortic atherosclerosis. IMPRESSION: Cardiomegaly with central vascular congestion. Electronically Signed   By: Luke Bun M.D.   On: 10/15/2024 15:10      Discharge Exam: Vitals:   11/06/24 0815 11/06/24 1131  BP: (!) 160/55 (!) 143/63  Pulse: 71 66  Resp: 18 18  Temp: 99.1 F (37.3 C) 97.8 F (36.6 C)  SpO2: 98% 95%   Vitals:   11/06/24 0438 11/06/24 0500 11/06/24 0815 11/06/24 1131  BP: (!) 150/61  (!) 160/55 (!) 143/63  Pulse: 67  71 66  Resp: 20 19 18 18   Temp: (!) 97.1 F (36.2 C)  99.1 F (37.3 C) 97.8 F (36.6 C)  TempSrc:   Oral Oral  SpO2: 96%  98% 95%  Weight:  76 kg    Height:        General exam: Appears calm and comfortable, NAD  Respiratory system: No work of breathing, symmetric chest wall expansion Cardiovascular system: S1 & S2 heard, RRR.  Gastrointestinal system: Abdomen is nondistended, soft and nontender.  Neuro: Alert and oriented.  Psychiatry: Mood & affect appropriate for situation.    The results of significant diagnostics from this hospitalization (including imaging, microbiology, ancillary and laboratory) are listed below for reference.     Microbiology: Recent Results (from the past 240 hours)  Resp panel by RT-PCR (RSV, Flu A&B, Covid) Anterior Nasal Swab     Status: None   Collection Time: 11/03/24  2:27 AM   Specimen: Anterior Nasal Swab  Result Value Ref Range Status   SARS Coronavirus 2 by RT PCR NEGATIVE NEGATIVE Final    Comment: (NOTE) SARS-CoV-2 target nucleic acids are NOT DETECTED.  The SARS-CoV-2 RNA is generally detectable in upper respiratory specimens during the acute phase of infection. The lowest concentration of SARS-CoV-2 viral copies this assay can detect is 138 copies/mL. A negative result does not preclude SARS-Cov-2 infection and should not be used as the sole basis for treatment or other patient management decisions. A negative result may occur  with  improper specimen collection/handling, submission of specimen other than nasopharyngeal swab, presence of viral mutation(s) within the areas targeted by this assay, and inadequate number of viral copies(<138 copies/mL). A negative result must be combined with clinical observations, patient history, and epidemiological information. The expected result is Negative.  Fact Sheet for Patients:  bloggercourse.com  Fact Sheet for Healthcare Providers:  seriousbroker.it  This test is no t yet approved or cleared by the [place]  FDA and  has been authorized for  detection and/or diagnosis of SARS-CoV-2 by FDA under an Emergency Use Authorization (EUA). This EUA will remain  in effect (meaning this test can be used) for the duration of the COVID-19 declaration under Section 564(b)(1) of the Act, 21 U.S.C.section 360bbb-3(b)(1), unless the authorization is terminated  or revoked sooner.       Influenza A by PCR NEGATIVE NEGATIVE Final   Influenza B by PCR NEGATIVE NEGATIVE Final    Comment: (NOTE) The Xpert Xpress SARS-CoV-2/FLU/RSV plus assay is intended as an aid in the diagnosis of influenza from Nasopharyngeal swab specimens and should not be used as a sole basis for treatment. Nasal washings and aspirates are unacceptable for Xpert Xpress SARS-CoV-2/FLU/RSV testing.  Fact Sheet for Patients: bloggercourse.com  Fact Sheet for Healthcare Providers: seriousbroker.it  This test is not yet approved or cleared by the [place]  FDA and has been authorized for detection and/or diagnosis of SARS-CoV-2 by FDA under an Emergency Use Authorization (EUA). This EUA will remain in effect (meaning this test can be used) for the duration of the COVID-19 declaration under Section 564(b)(1) of the Act, 21 U.S.C. section 360bbb-3(b)(1), unless the authorization is terminated  or revoked.     Resp Syncytial Virus by PCR NEGATIVE NEGATIVE Final    Comment: (NOTE) Fact Sheet for Patients: bloggercourse.com  Fact Sheet for Healthcare Providers: seriousbroker.it  This test is not yet approved or cleared by the [place]  FDA and has been authorized for detection and/or diagnosis of SARS-CoV-2 by FDA under an Emergency Use Authorization (EUA). This EUA will remain in effect (meaning this test can be used) for the duration of the COVID-19 declaration under Section 564(b)(1) of the Act, 21 U.S.C. section 360bbb-3(b)(1), unless the authorization is terminated or revoked.  Performed at [facility], [address] [place], [place] [address]   Respiratory (~20 pathogens) panel by PCR     Status: None   Collection Time: 11/04/24  2:22 PM   Specimen: Nasopharyngeal Swab; Respiratory  Result Value Ref Range Status   Adenovirus NOT DETECTED NOT DETECTED Final   Coronavirus 229E NOT DETECTED NOT DETECTED Final    Comment: (NOTE) The Coronavirus on the Respiratory Panel, DOES NOT test for the novel  Coronavirus (2019 nCoV)    Coronavirus HKU1 NOT DETECTED NOT DETECTED Final   Coronavirus NL63 NOT DETECTED NOT DETECTED Final   Coronavirus OC43 NOT DETECTED NOT DETECTED Final   Metapneumovirus NOT DETECTED NOT DETECTED Final   Rhinovirus / Enterovirus NOT DETECTED NOT DETECTED Final   Influenza A NOT DETECTED NOT DETECTED Final   Influenza B NOT DETECTED NOT DETECTED Final   Parainfluenza Virus 1 NOT DETECTED NOT DETECTED Final   Parainfluenza Virus 2 NOT DETECTED NOT DETECTED Final   Parainfluenza Virus 3 NOT DETECTED NOT DETECTED Final   Parainfluenza Virus 4 NOT DETECTED NOT DETECTED Final   Respiratory Syncytial Virus NOT DETECTED NOT DETECTED Final   Bordetella pertussis NOT DETECTED NOT DETECTED Final   Bordetella Parapertussis NOT DETECTED NOT DETECTED Final   Chlamydophila pneumoniae NOT  DETECTED NOT DETECTED Final   Mycoplasma pneumoniae NOT DETECTED NOT DETECTED Final    Comment: Performed at [facility], [address] [place], [place] [address]     Labs: BNP (last 3 results) Recent Labs    03/09/24 1918 04/24/24 0633 05/18/24 0500  BNP 2,527.2* 1,057.8* 2,692.7*   Basic Metabolic Panel: Recent Labs  Lab 11/03/24 0228 11/04/24 0413 11/05/24 0423 11/06/24 0520  NA 142 138 133* 137  K 3.6 3.7 4.3 4.1  CL 102 97* 93* 96*  CO2 22 26 22 26   GLUCOSE 164* 102* 151* 111*  BUN 53* 42* 64* 56*  CREATININE 3.18* 2.83* 3.14* 2.61*  CALCIUM  8.7* 8.9 8.7* 9.2  MG  --   --   --  1.7  PHOS  --   --   --  3.3   Liver Function Tests: Recent Labs  Lab 11/03/24 0228 11/06/24 0520  AST 35 26  ALT 20 22  ALKPHOS 140* 100  BILITOT 0.7 0.6  PROT 7.6 6.9  ALBUMIN  4.2 4.3   No results for input(s): LIPASE, AMYLASE in the last 168 hours. No results for input(s): AMMONIA in the last 168 hours. CBC: Recent Labs  Lab 11/03/24 0228 11/06/24 0520  WBC 8.4 8.0  NEUTROABS 4.7  --   HGB 11.1* 10.0*  HCT 35.6* 30.4*  MCV 92.2 88.1  PLT 204 189   Cardiac Enzymes: No results for input(s): CKTOTAL, CKMB, CKMBINDEX, TROPONINI in the last 168 hours. BNP: Invalid input(s): POCBNP CBG: Recent Labs  Lab 11/05/24 1215 11/05/24 1700 11/05/24 2042 11/06/24 0812 11/06/24 1132  GLUCAP 127* 155* 148* 90 108*   D-Dimer No results for input(s): DDIMER in the last 72 hours. Hgb A1c No results for input(s): HGBA1C in the last 72 hours. Lipid Profile No results for input(s): CHOL, HDL, LDLCALC, TRIG, CHOLHDL, LDLDIRECT in the last 72 hours. Thyroid  function studies No results for input(s): TSH, T4TOTAL, T3FREE, THYROIDAB in the last 72 hours.  Invalid input(s): FREET3 Anemia work up No results for input(s): VITAMINB12, FOLATE, FERRITIN, TIBC, IRON, RETICCTPCT in the last 72 hours. Urinalysis    Component  Value Date/Time   COLORURINE YELLOW (A) 10/15/2024 1950   APPEARANCEUR HAZY (A) 10/15/2024 1950   APPEARANCEUR Clear 09/17/2018 1028   LABSPEC 1.010 10/15/2024 1950   LABSPEC 1.013 06/20/2012 1508   PHURINE 6.0 10/15/2024 1950   GLUCOSEU NEGATIVE 10/15/2024 1950   GLUCOSEU Negative 06/20/2012 1508   HGBUR NEGATIVE 10/15/2024 1950   BILIRUBINUR NEGATIVE 10/15/2024 1950   BILIRUBINUR Negative 09/17/2018 1028   BILIRUBINUR Negative 06/20/2012 1508   KETONESUR NEGATIVE 10/15/2024 1950   PROTEINUR 100 (A) 10/15/2024 1950   NITRITE NEGATIVE 10/15/2024 1950   LEUKOCYTESUR NEGATIVE 10/15/2024 1950   LEUKOCYTESUR Negative 06/20/2012 1508   Sepsis Labs Recent Labs  Lab 11/03/24 0228 11/06/24 0520  WBC 8.4 8.0   Microbiology Recent Results (from the past 240 hours)  Resp panel by RT-PCR (RSV, Flu A&B, Covid) Anterior Nasal Swab     Status: None   Collection Time: 11/03/24  2:27 AM   Specimen: Anterior Nasal Swab  Result Value Ref Range Status   SARS Coronavirus 2 by RT PCR NEGATIVE NEGATIVE Final    Comment: (NOTE) SARS-CoV-2 target nucleic acids are NOT DETECTED.  The SARS-CoV-2 RNA is generally detectable in upper respiratory specimens during the acute phase of infection. The lowest concentration of SARS-CoV-2 viral copies this assay can detect is 138 copies/mL. A negative result does not preclude SARS-Cov-2 infection and should not be used as the sole basis for treatment or other patient management decisions. A negative result may occur with  improper specimen collection/handling, submission of specimen other than nasopharyngeal swab, presence of viral mutation(s) within the areas targeted by this assay, and inadequate number of viral copies(<138 copies/mL). A negative result must be combined with clinical observations, patient history, and epidemiological information. The expected result is Negative.  Fact Sheet for Patients:   bloggercourse.com  Fact Sheet for Healthcare Providers:  seriousbroker.it  This test is no t yet approved or cleared by the [place]  FDA and  has been authorized for detection and/or diagnosis of SARS-CoV-2 by FDA under an Emergency Use Authorization (EUA). This EUA will remain  in effect (meaning this test can be used) for the duration of the COVID-19 declaration under Section 564(b)(1) of the Act, 21 U.S.C.section 360bbb-3(b)(1), unless the authorization is terminated  or revoked sooner.       Influenza A by PCR NEGATIVE NEGATIVE Final   Influenza B by PCR NEGATIVE NEGATIVE Final    Comment: (NOTE) The Xpert Xpress SARS-CoV-2/FLU/RSV plus assay is intended as an aid in the diagnosis of influenza from Nasopharyngeal swab specimens and should not be used as a sole basis for treatment. Nasal washings and aspirates are unacceptable for Xpert Xpress SARS-CoV-2/FLU/RSV testing.  Fact Sheet for Patients: bloggercourse.com  Fact Sheet for Healthcare Providers: seriousbroker.it  This test is not yet approved or cleared by the [place]  FDA and has been authorized for detection and/or diagnosis of SARS-CoV-2 by FDA under an Emergency Use Authorization (EUA). This EUA will remain in effect (meaning this test can be used) for the duration of the COVID-19 declaration under Section 564(b)(1) of the Act, 21 U.S.C. section 360bbb-3(b)(1), unless the authorization is terminated or revoked.     Resp Syncytial Virus by PCR NEGATIVE NEGATIVE Final    Comment: (NOTE) Fact Sheet for Patients: bloggercourse.com  Fact Sheet for Healthcare Providers: seriousbroker.it  This test is not yet approved or cleared by the [place]  FDA and has been authorized for detection and/or diagnosis of SARS-CoV-2 by FDA under an Emergency Use  Authorization (EUA). This EUA will remain in effect (meaning this test can be used) for the duration of the COVID-19 declaration under Section 564(b)(1) of the Act, 21 U.S.C. section 360bbb-3(b)(1), unless the authorization is terminated or revoked.  Performed at [facility], [address] [place], [place] [address]   Respiratory (~20 pathogens) panel by PCR     Status: None   Collection Time: 11/04/24  2:22 PM   Specimen: Nasopharyngeal Swab; Respiratory  Result Value Ref Range Status   Adenovirus NOT DETECTED NOT DETECTED Final   Coronavirus 229E NOT DETECTED NOT DETECTED Final    Comment: (NOTE) The Coronavirus on the Respiratory Panel, DOES NOT test for the novel  Coronavirus (2019 nCoV)    Coronavirus HKU1 NOT DETECTED NOT DETECTED Final   Coronavirus NL63 NOT DETECTED NOT DETECTED Final   Coronavirus OC43 NOT DETECTED NOT DETECTED Final   Metapneumovirus NOT DETECTED NOT DETECTED Final   Rhinovirus / Enterovirus NOT DETECTED NOT DETECTED Final   Influenza A NOT DETECTED NOT DETECTED Final   Influenza B NOT DETECTED NOT DETECTED Final   Parainfluenza Virus 1 NOT DETECTED NOT DETECTED Final   Parainfluenza Virus 2 NOT DETECTED NOT DETECTED Final   Parainfluenza Virus 3 NOT DETECTED NOT DETECTED Final   Parainfluenza Virus 4 NOT DETECTED NOT DETECTED Final   Respiratory Syncytial Virus NOT DETECTED NOT DETECTED Final   Bordetella pertussis NOT DETECTED NOT DETECTED Final   Bordetella Parapertussis NOT DETECTED NOT DETECTED Final   Chlamydophila pneumoniae NOT DETECTED NOT DETECTED Final   Mycoplasma pneumoniae NOT DETECTED NOT DETECTED Final    Comment: Performed at [facility], [address], [place], [place] [address]     Time coordinating discharge: 32 min   SIGNED: Nashton Belson, DO [facility] 11/06/2024, 2:00 PM Pager  If 7PM-7AM, please contact night-coverage     [1]  Allergies Allergen Reactions   Ace Inhibitors     Other  reaction(s): Unknown   Egg Protein-Containing Drug Products Diarrhea   Other     Other reaction(s): Other (See Comments) Eggs   Prednisone      Other reaction(s): Other (See Comments) joint pain   Risedronate     Other reaction(s): Other (See Comments)   Sulfa Antibiotics Itching and Swelling    Other reaction(s): Other (See Comments)   Sulfasalazine Other (See Comments)   "

## 2024-11-06 NOTE — TOC Transition Note (Addendum)
 Transition of Care [facility]) - Discharge Note   Patient Details  Name: [name] MRN: [id number] Date of Birth: 11-04-1940  Transition of Care [facility]) CM/SW Contact:  Shasta DELENA Daring, RN Phone Number: 11/06/2024, 2:16 PM   Clinical Narrative:    RNCM met with patient. Advised HH services were established with [facility].  Also advised a 3 in 1 chair has been ordered and delivery was requested to patient home.   Patient verbalized understanding.  Patient has discharge order.    Notified [employer] and [facility].  Transportation resources added to AVS Additional SDOH resources given to patient  No additional TOC needs.    RNCM signing off.      Barriers to Discharge: Barriers Resolved   Patient Goals and CMS Choice            Discharge Placement                  Name of family member notified: patient Patient and family notified of of transfer: 11/06/24  Discharge Plan and Services Additional resources added to the After Visit Summary for     Discharge Planning Services: CM Consult              DME Agency: [facility] Date DME Agency Contacted: 11/06/24 Time DME Agency Contacted: 1415 Representative spoke with at DME Agency: [facility] HH Arranged: PT HH Agency: [facility] Date [facility] Agency Contacted: 11/06/24 Time HH Agency Contacted: 1415 Representative spoke with at [facility] Agency: [place]  Social Drivers of Health (SDOH) Interventions SDOH Screenings   Food Insecurity: No Food Insecurity (11/03/2024)  Housing: Unknown (11/03/2024)  Transportation Needs: Unmet Transportation Needs (11/03/2024)  Utilities: Patient Declined (11/03/2024)  Alcohol Screen: Low Risk (05/27/2024)  Depression (PHQ2-9): Low Risk (08/19/2024)  Financial Resource Strain: Medium Risk (08/18/2024)  Physical Activity: Sufficiently Active (08/18/2024)  Recent Concern: Physical Activity - Insufficiently Active (05/27/2024)  Social Connections: Socially Integrated (11/03/2024)  Stress: No  Stress Concern Present (08/18/2024)  Tobacco Use: Low Risk (11/03/2024)  Health Literacy: Adequate Health Literacy (05/27/2024)     Readmission Risk Interventions    11/04/2024    5:34 PM 05/20/2024    3:15 PM 07/30/2023   10:37 AM  Readmission Risk Prevention Plan  Transportation Screening Complete Complete Complete  Medication Review Oceanographer) Complete Complete Complete  PCP or Specialist appointment within 3-5 days of discharge Complete Complete Complete  HRI or Home Care Consult Complete Complete Complete  SW Recovery Care/Counseling Consult  Complete Complete  Palliative Care Screening Not Applicable Not Applicable Not Applicable  Skilled Nursing Facility Not Applicable Not Applicable Not Applicable

## 2024-11-06 NOTE — Discharge Instructions (Signed)
   Transportation Resources for [employer]  Agency Name: [facility] Agency Address: [address] [place], [place] [address] Phone: [phone] Email: [email or web address] Website: [email or web address] Service(s) Offered: Housing services, self-sufficiency, congregate meal program, weatherization program, Field seismologist program, emergency food assistance,  housing counseling, home ownership program, wheels-towork program.  Agency Name: [facility] [employer] [phone]) Address: [address], [place], [place] [address] Phone: [phone] Website: [email or web address] Service(s) Offered: Transportation for [employer], subscription and demand response; Dial-a-Ride for citizens 62 years of age or older.  Agency Name: Department of Social Services Address: [address] [place], [place] [address] Phone: [phone] Service(s) Offered: Child support services; child welfare services; food stamps; Medicaid; work first family assistance; and aid with fuel,  rent, food and medicine, transportation assistance.  Agency Name: Disabled [employer] (DAV) Transportation  Network Phone: [phone] Service(s) Offered: Transports veterans to the [facility] medical center. Call  forty-eight hours in advance and leave the name, telephone  number, date, and time of appointment. Veteran will be  contacted by the driver the day before the appointment to  arrange a pick up point    [employer] currently provides door to door services. ACTA connects with PART daily for services to [facility]. ACTA also performs contract services to [employer] operates 27 vehicles, all but 3 mini-vans are equipped with lifts for special needs as well as the general public. ACTA drivers are each CDL certified and trained in First Aid and CPR. ACT[employer] was established in 2002 by [employer]. An independent Industrial/product designer. ACTA operates via Cytogeneticist with required local 10% match funding from [place]. ACT[employer] provides over 80,000 passenger trips each year, including [facility] and [employer] sites.  Call at least by 11 AM one business day prior to needing transportation  [employer].                      [place], [place] [address]     Office Hours: Monday-Friday  8 AM - 5 PM

## 2024-11-06 NOTE — Care Management Important Message (Signed)
 Important Message  Patient Details  Name: [name] MRN: [id number] Date of Birth: 1941-08-21   Important Message Given:  Yes - Medicare IM     Letticia Bhattacharyya 11/06/2024, 11:42 AM

## 2024-11-06 NOTE — Progress Notes (Signed)
 " [facility]  ROUNDING NOTE   Subjective:   [name]  is a 84 y.o.  female is well known to our practice and receives dialysis at [facility] on a MWF schedule  She presents to ED with shortness of breath requiring CPAP. 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   Update:  Patient seen resting in bed Patient states her respiratory status has improved mildly Continues to complain of shortness of breath on activity. Currently on 2 L nasal cannula No lower extremity edema Able to hold conversation without rest breaks  Objective:  Vital signs in last 24 hours:  Temp:  [97.1 F (36.2 C)-99.1 F (37.3 C)] 97.8 F (36.6 C) (02/05 1131) Pulse Rate:  [66-73] 66 (02/05 1131) Resp:  [18-20] 18 (02/05 1131) BP: (131-160)/(48-63) 143/63 (02/05 1131) SpO2:  [95 %-98 %] 95 % (02/05 1131) FiO2 (%):  [32 %] 32 % (02/04 2225) Weight:  [76 kg] 76 kg (02/05 0500)  Weight change: -1.1 kg Filed Weights   11/05/24 0638 11/05/24 0748 11/06/24 0500  Weight: (S) 78.9 kg 78.3 kg 76 kg    Intake/Output: I/O last 3 completed shifts: In: 540 [P.O.:540] Out: 2000 [Urine:1000; Other:1000]   Intake/Output this shift:  Total I/O In: 360 [P.O.:360] Out: -   Physical Exam: General: NAD  Head: Normocephalic, atraumatic. Moist oral mucosal membranes  Eyes: Anicteric  Lungs:  Clear, [place] O2  Heart: Regular rate and rhythm  Abdomen:  Soft, nontender  Extremities: No peripheral edema.  Neurologic: Awake, alert, conversant  Skin: Warm,dry, no rash  Access: Left upper AVF    Basic Metabolic Panel: Recent Labs  Lab  11/03/24 0228 11/04/24 0413 11/05/24 0423 11/06/24 0520  NA 142 138 133* 137  K 3.6 3.7 4.3 4.1  CL 102 97* 93* 96*  CO2 22 26 22 26   GLUCOSE 164* 102* 151* 111*  BUN 53* 42* 64* 56*  CREATININE 3.18* 2.83* 3.14* 2.61*  CALCIUM  8.7* 8.9 8.7* 9.2  MG  --   --   --  1.7  PHOS  --   --   --  3.3    Liver Function Tests: Recent Labs  Lab 11/03/24 0228 11/06/24 0520  AST 35 26  ALT 20 22  ALKPHOS 140* 100  BILITOT 0.7 0.6  PROT 7.6 6.9  ALBUMIN  4.2 4.3   No results for input(s): LIPASE, AMYLASE in the last 168 hours. No results for input(s): AMMONIA in the last 168 hours.  CBC: Recent Labs  Lab 11/03/24 0228 11/06/24 0520  WBC 8.4 8.0  NEUTROABS 4.7  --   HGB 11.1* 10.0*  HCT 35.6* 30.4*  MCV 92.2 88.1  PLT 204 189    Cardiac Enzymes: No results for input(s): CKTOTAL, CKMB, CKMBINDEX, TROPONINI in the last 168 hours.  BNP: Invalid input(s): POCBNP  CBG: Recent Labs  Lab 11/05/24 1215 11/05/24 1700 11/05/24 2042 11/06/24 0812 11/06/24 1132  GLUCAP 127* 155* 148* 90 108*    Microbiology: Results for orders placed or performed during the hospital encounter of 11/03/24  Resp panel by RT-PCR (RSV, Flu A&B, Covid) Anterior Nasal Swab     Status: None   Collection Time: 11/03/24  2:27 AM   Specimen: Anterior Nasal Swab  Result Value Ref Range Status   SARS Coronavirus 2 by RT PCR NEGATIVE NEGATIVE Final    Comment: (NOTE) SARS-CoV-2 target nucleic acids are NOT DETECTED.  The SARS-CoV-2 RNA is generally detectable in upper respiratory specimens during the acute phase of infection. The lowest concentration of SARS-CoV-2 viral copies this assay can detect is 138 copies/mL. A negative result does not preclude SARS-Cov-2 infection and should not be used as the sole basis for treatment or other patient management decisions. A negative result may occur with  improper specimen collection/handling, submission of specimen other than  nasopharyngeal swab, presence of viral mutation(s) within the areas targeted by this assay, and inadequate number of viral copies(<138 copies/mL). A negative result must be combined with clinical observations, patient history, and epidemiological information. The expected result is Negative.  Fact Sheet for Patients:  bloggercourse.com  Fact Sheet for Healthcare Providers:  seriousbroker.it  This test is no t yet approved or cleared by the [place]  FDA and  has been authorized for detection and/or diagnosis of SARS-CoV-2 by FDA under an Emergency Use Authorization (EUA). This EUA will remain  in effect (meaning this test can be used) for the duration of the COVID-19 declaration under Section 564(b)(1) of the Act, 21 U.S.C.section 360bbb-3(b)(1), unless the authorization is terminated  or revoked sooner.       Influenza A by PCR NEGATIVE NEGATIVE Final   Influenza B by PCR NEGATIVE NEGATIVE Final    Comment: (NOTE) The Xpert Xpress SARS-CoV-2/FLU/RSV plus assay is intended as an aid in the diagnosis of influenza from Nasopharyngeal swab specimens and should not be used as a sole basis for treatment. Nasal washings and aspirates are unacceptable for Xpert Xpress SARS-CoV-2/FLU/RSV testing.  Fact Sheet for Patients: bloggercourse.com  Fact Sheet for Healthcare Providers: seriousbroker.it  This test is not yet approved or cleared by the [place]  FDA and has been authorized for detection and/or diagnosis of SARS-CoV-2 by FDA under an Emergency Use Authorization (EUA). This EUA will remain in effect (meaning this test can be used) for the duration of the COVID-19 declaration under Section 564(b)(1) of the Act, 21 U.S.C. section 360bbb-3(b)(1), unless the authorization is terminated or revoked.     Resp Syncytial Virus by PCR NEGATIVE NEGATIVE Final    Comment:  (NOTE) Fact Sheet for Patients: bloggercourse.com  Fact Sheet for Healthcare Providers: seriousbroker.it  This test is not yet approved or cleared by the [place]  FDA and has been authorized for detection and/or diagnosis of SARS-CoV-2 by FDA under an Emergency Use Authorization (EUA). This EUA will remain in effect (meaning this test can be used) for the duration of the COVID-19 declaration under Section 564(b)(1) of the Act, 21 U.S.C. section 360bbb-3(b)(1), unless the authorization is terminated or revoked.  Performed at [facility], [address] [place], [place] [address]   Respiratory (~20 pathogens) panel by PCR     Status: None   Collection Time: 11/04/24  2:22 PM   Specimen: Nasopharyngeal Swab; Respiratory  Result Value Ref Range Status   Adenovirus NOT DETECTED NOT DETECTED Final   Coronavirus 229E NOT DETECTED NOT DETECTED Final    Comment: (NOTE) The Coronavirus on the Respiratory Panel, DOES NOT test for the novel  Coronavirus (2019 nCoV)    Coronavirus HKU1 NOT DETECTED NOT DETECTED Final   Coronavirus NL63 NOT DETECTED NOT DETECTED Final   Coronavirus OC43 NOT DETECTED NOT DETECTED Final   Metapneumovirus NOT DETECTED NOT DETECTED Final   Rhinovirus / Enterovirus NOT  DETECTED NOT DETECTED Final   Influenza A NOT DETECTED NOT DETECTED Final   Influenza B NOT DETECTED NOT DETECTED Final   Parainfluenza Virus 1 NOT DETECTED NOT DETECTED Final   Parainfluenza Virus 2 NOT DETECTED NOT DETECTED Final   Parainfluenza Virus 3 NOT DETECTED NOT DETECTED Final   Parainfluenza Virus 4 NOT DETECTED NOT DETECTED Final   Respiratory Syncytial Virus NOT DETECTED NOT DETECTED Final   Bordetella pertussis NOT DETECTED NOT DETECTED Final   Bordetella Parapertussis NOT DETECTED NOT DETECTED Final   Chlamydophila pneumoniae NOT DETECTED NOT DETECTED Final   Mycoplasma pneumoniae NOT DETECTED NOT DETECTED Final     Comment: Performed at [facility], [address], [place], [place] [address]    Coagulation Studies: No results for input(s): LABPROT, INR in the last 72 hours.  Urinalysis: No results for input(s): COLORURINE, LABSPEC, PHURINE, GLUCOSEU, HGBUR, BILIRUBINUR, KETONESUR, PROTEINUR, UROBILINOGEN, NITRITE, LEUKOCYTESUR in the last 72 hours.  Invalid input(s): APPERANCEUR    Imaging: CT CHEST WO CONTRAST Result Date: 11/04/2024 EXAM: CT CHEST WITHOUT CONTRAST 11/04/2024 04:07:33 PM TECHNIQUE: CT of the chest was performed without the administration of intravenous contrast. Multiplanar reformatted images are provided for review. Automated exposure control, iterative reconstruction, and/or weight based adjustment of the mA/kV was utilized to reduce the radiation dose to as low as reasonably achievable. COMPARISON: Portable chest from yesterday; AP and lateral chest from 10/15/2024, multiple chest films from last year, and CT chest with no contrast 04/18/2023. CLINICAL HISTORY: dyspnea. Dyspnea. FINDINGS: MEDIASTINUM: Heart is moderately enlarged with a left chamber predominance, unchanged. Moderate patchy calcification of the coronary arteries and aorta. Mild scattered calcification in the great vessels. No aortic aneurysm. No pericardial effusion. Interval increase in calcification in the lower mitral ring since 2024. Stable prominent central pulmonary arteries with pulmonary trunk measuring 3 cm, consistent with at least mild pulmonary arterial hypertension. Central pulmonary veins are also prominent but no more than previously. The central airways are clear. Small hiatal hernia. Very minimal to no thyroid  as before, consistent with prior ablation or removal. Surgical clips at the expected level of the left lobe. LYMPH NODES: Scattered right paratracheal lymph nodes, subcarinal and paratracheal space nodes up to 8 mm, unchanged. No enlarged thoracic lymph nodes identified  without contrast. Axillary spaces are clear. LUNGS AND PLEURA: No interstitial edema is seen in the lungs. Coarse linear scar-like markings in the lower lobes and right apex. Mild chronic subpleural reticulation in the lower lung zones without underlying honeycombing or bronchiolectasis. Subsegmental atelectasis in the posteromedial lung bases. Calcified granuloma posteriorly in the left lower lobe. No suspicious nodule bilaterally. No pleural effusion, thickening, or pneumothorax. SOFT TISSUES/BONES: Osteopenia and mild kyphosis of the thoracic spine. Chronic healed fractures in the right rib cage. No acute or suspicious osseous findings. No acute abnormality of the soft tissues. UPPER ABDOMEN: In the extreme upper pole of the right kidney, there is a Bosniak 1 cyst measuring 1.8 cm and -3 Hounsfield units. There is a 7.8 cm probable proteinaceous or hemorrhagic cyst just below this, Hounsfield density is 23. On the left, there is a probable proteinaceous or hemorrhagic cyst anteriorly measuring 2.6 cm, 25 Hounsfield units. Follow-up ultrasound is recommended to assess for complex versus cystic lesions. The gallbladder is absent as before. No biliary dilatation. Slight splenomegaly. No acute abnormality is seen in the upper abdomen. IMPRESSION: 1. Moderately enlarged heart with left chamber predominance and chronic central venous prominence , unchanged. 2. Stable prominent central pulmonary arteries  and pulmonary trunk the latter measuring 3 cm, consistent with at least mild pulmonary arterial hypertension. This was also seen previously. 3. Probable proteinaceous or hemorrhagic renal cysts, incompletely characterized; consider ultrasound follow-up or renal protocol MRI. 4. Mild chronic subpleural reticulation in the lower lung fields . No edema or pleural effusions. No active infiltrates. No bronchiectasis or bronchial impactions. 5. 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 Electronically  signed by: Francis Quam MD 11/04/2024 08:48 PM EST RP Workstation: [id number]     Medications:     amLODipine   5 mg Oral Daily   apixaban   2.5 mg Oral BID   Chlorhexidine  Gluconate Cloth  6 each Topical Q0600   dextromethorphan -guaiFENesin   1 tablet Oral BID   fluticasone  furoate-vilanterol  1 puff Inhalation Daily   hydrALAZINE   50 mg Oral TID   insulin  aspart  0-5 Units Subcutaneous QHS   insulin  aspart  0-6 Units Subcutaneous TID WC   ipratropium-albuterol   3 mL Nebulization BID   levothyroxine   150 mcg Oral Q0600   loratadine   10 mg Oral Daily   metoprolol  tartrate  12.5 mg Oral BID   pantoprazole   40 mg Oral Daily   predniSONE   40 mg Oral Q breakfast   rosuvastatin   40 mg Oral QPM   sertraline   100 mg Oral Daily   torsemide   20 mg Oral Q M,W,F   acetaminophen  **OR** acetaminophen , albuterol , ondansetron  **OR** ondansetron  (ZOFRAN ) IV, phenol  Assessment/ Plan:  [name] is a 84 y.o.  female  is well known to our practice and receives dialysis at [facility] on a MWF schedule   [facility]/MWF/left upper AVF  End-stage renal disease on hemodialysis.  Received dialysis yesterday with UF 1.1 L achieved.  Next treatment scheduled for Friday.  Will defer discharge plan to primary team  2.  Acute respiratory failure requiring CPAP.  Chest x-ray shows interstitial edema with worsening CHF versus fluid volume overload, small pleural effusion forming, and moderate enlarged heart.  Remains on 2 L nasal cannula, able to hold conversation without respirations.  3. Anemia of chronic kidney disease Lab Results  Component Value Date   HGB 10.0 (L) 11/06/2024    Hgb at goal, no indication for ESA  4. Secondary Hyperparathyroidism: with outpatient labs: PTH 137, phosphorus 2.3, calcium  9.0 on 10/13/24.   Lab Results  Component Value Date   PTH 130.0 08/11/2024   CALCIUM  9.2 11/06/2024   PHOS 3.3 11/06/2024    Calcium  and phosphorus within optimal range  for this patient.   LOS: 3 Deanette Tullius 2/5/20262:01 PM   "

## 2024-11-07 ENCOUNTER — Telehealth: Payer: Self-pay

## 2024-11-07 NOTE — Transitions of Care (Post Inpatient/ED Visit) (Signed)
" ° °  11/07/2024  Name: [name] MRN: [id number] DOB: 07/25/1941  Today's TOC FU Call Status: Today's TOC FU Call Status:: Unsuccessful Call (1st Attempt) Unsuccessful Call (1st Attempt) Date: 11/07/24  Attempted to reach the patient regarding the most recent Inpatient/ED visit.  Spoke with patient who states she is getting herself ready to go to HD on [address], niece is coming to get her. I wish I could have stayed at the hospital another day but, they let me come home yesterday. Well, trying to get to my dialysis, call me back another time.  Follow Up Plan: Additional outreach attempts will be made to reach the patient to complete the Transitions of Care (Post Inpatient/ED visit) call.   Richerd Fish, RN, BSN, CCM [facility], [facility] RN Care Manager Direct Dial: [phone]        "

## 2024-11-07 NOTE — Progress Notes (Signed)
 D/C order noted. Contacted [facility] to advised of pt and d/c. Requested documents faxed to clinic for continuation of care.  Suzen Satchel Dialysis Navigator [phone]

## 2024-12-01 ENCOUNTER — Encounter (INDEPENDENT_AMBULATORY_CARE_PROVIDER_SITE_OTHER)

## 2024-12-01 ENCOUNTER — Ambulatory Visit (INDEPENDENT_AMBULATORY_CARE_PROVIDER_SITE_OTHER): Admitting: Vascular Surgery

## 2024-12-18 ENCOUNTER — Ambulatory Visit: Admitting: Dermatology

## 2025-02-05 ENCOUNTER — Ambulatory Visit: Admitting: Student

## 2025-06-02 ENCOUNTER — Ambulatory Visit
